# Patient Record
Sex: Female | Born: 1940 | Race: White | Hispanic: No | Smoking: Never smoker
Health system: Southern US, Community
[De-identification: ages and names within clinical notes are randomized; demographics above are authoritative.]

## PROBLEM LIST (undated history)

## (undated) DIAGNOSIS — E1122 Type 2 diabetes mellitus with diabetic chronic kidney disease: Secondary | ICD-10-CM

## (undated) DIAGNOSIS — Z9981 Dependence on supplemental oxygen: Secondary | ICD-10-CM

## (undated) DIAGNOSIS — Z8719 Personal history of other diseases of the digestive system: Secondary | ICD-10-CM

## (undated) DIAGNOSIS — F419 Anxiety disorder, unspecified: Secondary | ICD-10-CM

## (undated) DIAGNOSIS — T7840XA Allergy, unspecified, initial encounter: Secondary | ICD-10-CM

## (undated) DIAGNOSIS — I5033 Acute on chronic diastolic (congestive) heart failure: Secondary | ICD-10-CM

## (undated) DIAGNOSIS — I10 Essential (primary) hypertension: Secondary | ICD-10-CM

## (undated) DIAGNOSIS — K5792 Diverticulitis of intestine, part unspecified, without perforation or abscess without bleeding: Secondary | ICD-10-CM

## (undated) DIAGNOSIS — J9610 Chronic respiratory failure, unspecified whether with hypoxia or hypercapnia: Secondary | ICD-10-CM

## (undated) DIAGNOSIS — K589 Irritable bowel syndrome without diarrhea: Secondary | ICD-10-CM

## (undated) DIAGNOSIS — Z8489 Family history of other specified conditions: Secondary | ICD-10-CM

## (undated) DIAGNOSIS — I219 Acute myocardial infarction, unspecified: Secondary | ICD-10-CM

## (undated) DIAGNOSIS — M5030 Other cervical disc degeneration, unspecified cervical region: Secondary | ICD-10-CM

## (undated) DIAGNOSIS — E559 Vitamin D deficiency, unspecified: Secondary | ICD-10-CM

## (undated) DIAGNOSIS — T148XXA Other injury of unspecified body region, initial encounter: Secondary | ICD-10-CM

## (undated) DIAGNOSIS — C689 Malignant neoplasm of urinary organ, unspecified: Secondary | ICD-10-CM

## (undated) DIAGNOSIS — I4892 Unspecified atrial flutter: Secondary | ICD-10-CM

## (undated) DIAGNOSIS — M1990 Unspecified osteoarthritis, unspecified site: Secondary | ICD-10-CM

## (undated) DIAGNOSIS — G5793 Unspecified mononeuropathy of bilateral lower limbs: Secondary | ICD-10-CM

## (undated) DIAGNOSIS — I5042 Chronic combined systolic (congestive) and diastolic (congestive) heart failure: Secondary | ICD-10-CM

## (undated) DIAGNOSIS — I428 Other cardiomyopathies: Secondary | ICD-10-CM

## (undated) DIAGNOSIS — K5521 Angiodysplasia of colon with hemorrhage: Secondary | ICD-10-CM

## (undated) DIAGNOSIS — I471 Supraventricular tachycardia, unspecified: Secondary | ICD-10-CM

## (undated) DIAGNOSIS — D126 Benign neoplasm of colon, unspecified: Secondary | ICD-10-CM

## (undated) DIAGNOSIS — Z87442 Personal history of urinary calculi: Secondary | ICD-10-CM

## (undated) DIAGNOSIS — I480 Paroxysmal atrial fibrillation: Secondary | ICD-10-CM

## (undated) DIAGNOSIS — R0600 Dyspnea, unspecified: Secondary | ICD-10-CM

## (undated) DIAGNOSIS — J45909 Unspecified asthma, uncomplicated: Secondary | ICD-10-CM

## (undated) DIAGNOSIS — R55 Syncope and collapse: Secondary | ICD-10-CM

## (undated) DIAGNOSIS — N183 Chronic kidney disease, stage 3 unspecified: Secondary | ICD-10-CM

## (undated) DIAGNOSIS — N186 End stage renal disease: Secondary | ICD-10-CM

## (undated) DIAGNOSIS — G510 Bell's palsy: Secondary | ICD-10-CM

## (undated) DIAGNOSIS — K449 Diaphragmatic hernia without obstruction or gangrene: Secondary | ICD-10-CM

## (undated) DIAGNOSIS — C186 Malignant neoplasm of descending colon: Secondary | ICD-10-CM

## (undated) DIAGNOSIS — C651 Malignant neoplasm of right renal pelvis: Secondary | ICD-10-CM

## (undated) DIAGNOSIS — I129 Hypertensive chronic kidney disease with stage 1 through stage 4 chronic kidney disease, or unspecified chronic kidney disease: Secondary | ICD-10-CM

## (undated) DIAGNOSIS — R519 Headache, unspecified: Secondary | ICD-10-CM

## (undated) DIAGNOSIS — Q2112 Patent foramen ovale: Secondary | ICD-10-CM

## (undated) DIAGNOSIS — S99929A Unspecified injury of unspecified foot, initial encounter: Secondary | ICD-10-CM

## (undated) DIAGNOSIS — E669 Obesity, unspecified: Secondary | ICD-10-CM

## (undated) DIAGNOSIS — R195 Other fecal abnormalities: Secondary | ICD-10-CM

## (undated) DIAGNOSIS — R0902 Hypoxemia: Secondary | ICD-10-CM

## (undated) DIAGNOSIS — E039 Hypothyroidism, unspecified: Secondary | ICD-10-CM

## (undated) DIAGNOSIS — Z1509 Genetic susceptibility to other malignant neoplasm: Secondary | ICD-10-CM

## (undated) DIAGNOSIS — M109 Gout, unspecified: Secondary | ICD-10-CM

## (undated) DIAGNOSIS — I119 Hypertensive heart disease without heart failure: Secondary | ICD-10-CM

## (undated) DIAGNOSIS — A4101 Sepsis due to Methicillin susceptible Staphylococcus aureus: Secondary | ICD-10-CM

## (undated) DIAGNOSIS — I4819 Other persistent atrial fibrillation: Secondary | ICD-10-CM

## (undated) DIAGNOSIS — Z9889 Other specified postprocedural states: Secondary | ICD-10-CM

## (undated) DIAGNOSIS — E785 Hyperlipidemia, unspecified: Secondary | ICD-10-CM

## (undated) DIAGNOSIS — K3184 Gastroparesis: Secondary | ICD-10-CM

## (undated) DIAGNOSIS — N185 Chronic kidney disease, stage 5: Secondary | ICD-10-CM

## (undated) DIAGNOSIS — T4145XA Adverse effect of unspecified anesthetic, initial encounter: Secondary | ICD-10-CM

## (undated) DIAGNOSIS — F329 Major depressive disorder, single episode, unspecified: Secondary | ICD-10-CM

## (undated) DIAGNOSIS — Z9289 Personal history of other medical treatment: Secondary | ICD-10-CM

## (undated) DIAGNOSIS — H9190 Unspecified hearing loss, unspecified ear: Secondary | ICD-10-CM

## (undated) DIAGNOSIS — I5032 Chronic diastolic (congestive) heart failure: Secondary | ICD-10-CM

## (undated) DIAGNOSIS — D649 Anemia, unspecified: Secondary | ICD-10-CM

## (undated) DIAGNOSIS — F32A Depression, unspecified: Secondary | ICD-10-CM

## (undated) DIAGNOSIS — F0390 Unspecified dementia without behavioral disturbance: Secondary | ICD-10-CM

## (undated) DIAGNOSIS — R112 Nausea with vomiting, unspecified: Secondary | ICD-10-CM

## (undated) DIAGNOSIS — T8859XA Other complications of anesthesia, initial encounter: Secondary | ICD-10-CM

## (undated) DIAGNOSIS — G4730 Sleep apnea, unspecified: Secondary | ICD-10-CM

## (undated) DIAGNOSIS — E119 Type 2 diabetes mellitus without complications: Secondary | ICD-10-CM

## (undated) DIAGNOSIS — K219 Gastro-esophageal reflux disease without esophagitis: Secondary | ICD-10-CM

## (undated) DIAGNOSIS — C182 Malignant neoplasm of ascending colon: Secondary | ICD-10-CM

## (undated) DIAGNOSIS — Z973 Presence of spectacles and contact lenses: Secondary | ICD-10-CM

## (undated) DIAGNOSIS — I2510 Atherosclerotic heart disease of native coronary artery without angina pectoris: Secondary | ICD-10-CM

## (undated) DIAGNOSIS — I499 Cardiac arrhythmia, unspecified: Secondary | ICD-10-CM

## (undated) HISTORY — DX: Bell's palsy: G51.0

## (undated) HISTORY — PX: APPENDECTOMY: SHX54

## (undated) HISTORY — DX: Irritable bowel syndrome, unspecified: K58.9

## (undated) HISTORY — DX: Chronic kidney disease, stage 3 (moderate): N18.3

## (undated) HISTORY — DX: Unspecified osteoarthritis, unspecified site: M19.90

## (undated) HISTORY — DX: Hypertensive chronic kidney disease with stage 1 through stage 4 chronic kidney disease, or unspecified chronic kidney disease: I12.9

## (undated) HISTORY — DX: Major depressive disorder, single episode, unspecified: F32.9

## (undated) HISTORY — DX: Chronic kidney disease, stage 5: N18.5

## (undated) HISTORY — DX: Chronic kidney disease, stage 3 unspecified: N18.30

## (undated) HISTORY — DX: Unspecified injury of unspecified foot, initial encounter: S99.929A

## (undated) HISTORY — DX: Hypoxemia: R09.02

## (undated) HISTORY — DX: Gastro-esophageal reflux disease without esophagitis: K21.9

## (undated) HISTORY — DX: Unspecified asthma, uncomplicated: J45.909

## (undated) HISTORY — DX: Allergy, unspecified, initial encounter: T78.40XA

## (undated) HISTORY — DX: Personal history of other diseases of the digestive system: Z98.890

## (undated) HISTORY — DX: Diaphragmatic hernia without obstruction or gangrene: K44.9

## (undated) HISTORY — PX: POLYPECTOMY: SHX149

## (undated) HISTORY — PX: ESOPHAGEAL DILATION: SHX303

## (undated) HISTORY — PX: CARPAL TUNNEL RELEASE: SHX101

## (undated) HISTORY — DX: Depression, unspecified: F32.A

## (undated) HISTORY — DX: Chronic diastolic (congestive) heart failure: I50.32

## (undated) HISTORY — DX: Benign neoplasm of colon, unspecified: D12.6

## (undated) HISTORY — PX: TUBAL LIGATION: SHX77

## (undated) HISTORY — DX: Hypertensive chronic kidney disease with stage 1 through stage 4 chronic kidney disease, or unspecified chronic kidney disease: E11.22

## (undated) HISTORY — PX: TRIGGER FINGER RELEASE: SHX641

## (undated) HISTORY — PX: KIDNEY STONE SURGERY: SHX686

## (undated) HISTORY — DX: Personal history of other diseases of the digestive system: Z87.19

## (undated) HISTORY — PX: TOTAL ABDOMINAL HYSTERECTOMY: SHX209

## (undated) HISTORY — DX: Other cervical disc degeneration, unspecified cervical region: M50.30

## (undated) HISTORY — DX: Hyperlipidemia, unspecified: E78.5

## (undated) HISTORY — PX: UPPER GASTROINTESTINAL ENDOSCOPY: SHX188

## (undated) HISTORY — PX: FACIAL FRACTURE SURGERY: SHX1570

## (undated) HISTORY — DX: Gastroparesis: K31.84

## (undated) HISTORY — DX: Vitamin D deficiency, unspecified: E55.9

## (undated) HISTORY — DX: Anxiety disorder, unspecified: F41.9

## (undated) HISTORY — PX: COLONOSCOPY: SHX174

## (undated) HISTORY — PX: SIGMOIDOSCOPY: SUR1295

## (undated) HISTORY — PX: CERVICAL SPINE SURGERY: SHX589

## (undated) HISTORY — DX: Malignant neoplasm of right renal pelvis: C65.1

## (undated) HISTORY — PX: EYE SURGERY: SHX253

## (undated) HISTORY — PX: CHOLECYSTECTOMY: SHX55

## (undated) HISTORY — PX: COLONOSCOPY W/ POLYPECTOMY: SHX1380

## (undated) HISTORY — DX: Essential (primary) hypertension: I10

## (undated) HISTORY — DX: Angiodysplasia of colon with hemorrhage: K55.21

## (undated) SURGERY — Surgical Case
Anesthesia: *Unknown

---
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
## 1962-01-26 HISTORY — PX: CHOLECYSTECTOMY: SHX55

## 2003-06-30 ENCOUNTER — Encounter
Admission: RE | Admit: 2003-06-30 | Discharge: 2003-06-30 | Payer: Self-pay | Admitting: Physical Medicine and Rehabilitation

## 2004-02-20 ENCOUNTER — Ambulatory Visit: Payer: Self-pay | Admitting: Gastroenterology

## 2004-02-26 ENCOUNTER — Ambulatory Visit (HOSPITAL_COMMUNITY): Admission: RE | Admit: 2004-02-26 | Discharge: 2004-02-26 | Payer: Self-pay | Admitting: Internal Medicine

## 2004-02-26 ENCOUNTER — Ambulatory Visit: Payer: Self-pay | Admitting: Internal Medicine

## 2004-04-24 ENCOUNTER — Ambulatory Visit: Payer: Self-pay | Admitting: Cardiology

## 2004-06-04 ENCOUNTER — Ambulatory Visit: Payer: Self-pay | Admitting: Internal Medicine

## 2004-06-05 ENCOUNTER — Ambulatory Visit (HOSPITAL_COMMUNITY): Admission: RE | Admit: 2004-06-05 | Discharge: 2004-06-05 | Payer: Self-pay | Admitting: Internal Medicine

## 2004-07-11 ENCOUNTER — Emergency Department (HOSPITAL_COMMUNITY): Admission: EM | Admit: 2004-07-11 | Discharge: 2004-07-11 | Payer: Self-pay | Admitting: *Deleted

## 2004-07-25 ENCOUNTER — Encounter: Admission: RE | Admit: 2004-07-25 | Discharge: 2004-07-25 | Payer: Self-pay | Admitting: Specialist

## 2004-12-29 ENCOUNTER — Encounter (INDEPENDENT_AMBULATORY_CARE_PROVIDER_SITE_OTHER): Payer: Self-pay | Admitting: General Surgery

## 2004-12-29 ENCOUNTER — Ambulatory Visit (HOSPITAL_COMMUNITY): Admission: RE | Admit: 2004-12-29 | Discharge: 2004-12-29 | Payer: Self-pay | Admitting: General Surgery

## 2005-06-12 ENCOUNTER — Ambulatory Visit (HOSPITAL_COMMUNITY): Admission: RE | Admit: 2005-06-12 | Discharge: 2005-06-12 | Payer: Self-pay | Admitting: Neurosurgery

## 2009-02-07 DIAGNOSIS — K219 Gastro-esophageal reflux disease without esophagitis: Secondary | ICD-10-CM

## 2009-02-07 DIAGNOSIS — F411 Generalized anxiety disorder: Secondary | ICD-10-CM

## 2009-02-07 DIAGNOSIS — E039 Hypothyroidism, unspecified: Secondary | ICD-10-CM

## 2009-02-07 DIAGNOSIS — J45909 Unspecified asthma, uncomplicated: Secondary | ICD-10-CM

## 2009-02-07 DIAGNOSIS — Z8719 Personal history of other diseases of the digestive system: Secondary | ICD-10-CM

## 2009-02-07 DIAGNOSIS — R1310 Dysphagia, unspecified: Secondary | ICD-10-CM

## 2009-02-07 DIAGNOSIS — M1990 Unspecified osteoarthritis, unspecified site: Secondary | ICD-10-CM | POA: Insufficient documentation

## 2009-02-07 DIAGNOSIS — I959 Hypotension, unspecified: Secondary | ICD-10-CM | POA: Insufficient documentation

## 2009-02-07 HISTORY — DX: Dysphagia, unspecified: R13.10

## 2009-02-07 HISTORY — DX: Personal history of other diseases of the digestive system: Z87.19

## 2009-02-07 HISTORY — DX: Unspecified osteoarthritis, unspecified site: M19.90

## 2009-02-07 HISTORY — DX: Gastro-esophageal reflux disease without esophagitis: K21.9

## 2009-02-07 HISTORY — DX: Generalized anxiety disorder: F41.1

## 2009-02-08 ENCOUNTER — Ambulatory Visit: Payer: Self-pay | Admitting: Internal Medicine

## 2009-02-12 ENCOUNTER — Encounter: Payer: Self-pay | Admitting: Internal Medicine

## 2009-02-12 ENCOUNTER — Telehealth (INDEPENDENT_AMBULATORY_CARE_PROVIDER_SITE_OTHER): Payer: Self-pay | Admitting: *Deleted

## 2009-02-13 ENCOUNTER — Ambulatory Visit: Payer: Self-pay | Admitting: Internal Medicine

## 2009-02-13 ENCOUNTER — Ambulatory Visit (HOSPITAL_COMMUNITY): Admission: RE | Admit: 2009-02-13 | Discharge: 2009-02-13 | Payer: Self-pay | Admitting: Internal Medicine

## 2009-02-13 DIAGNOSIS — D126 Benign neoplasm of colon, unspecified: Secondary | ICD-10-CM

## 2009-02-13 HISTORY — DX: Benign neoplasm of colon, unspecified: D12.6

## 2009-02-17 ENCOUNTER — Encounter: Payer: Self-pay | Admitting: Internal Medicine

## 2009-02-19 ENCOUNTER — Telehealth (INDEPENDENT_AMBULATORY_CARE_PROVIDER_SITE_OTHER): Payer: Self-pay

## 2010-01-29 ENCOUNTER — Ambulatory Visit
Admission: RE | Admit: 2010-01-29 | Discharge: 2010-01-29 | Payer: Self-pay | Source: Home / Self Care | Attending: Cardiology | Admitting: Cardiology

## 2010-01-29 ENCOUNTER — Encounter: Payer: Self-pay | Admitting: Cardiology

## 2010-01-29 DIAGNOSIS — I6529 Occlusion and stenosis of unspecified carotid artery: Secondary | ICD-10-CM | POA: Insufficient documentation

## 2010-01-29 DIAGNOSIS — R002 Palpitations: Secondary | ICD-10-CM | POA: Insufficient documentation

## 2010-01-29 DIAGNOSIS — R000 Tachycardia, unspecified: Secondary | ICD-10-CM | POA: Insufficient documentation

## 2010-01-29 HISTORY — DX: Palpitations: R00.2

## 2010-01-29 HISTORY — DX: Occlusion and stenosis of unspecified carotid artery: I65.29

## 2010-02-11 ENCOUNTER — Telehealth (INDEPENDENT_AMBULATORY_CARE_PROVIDER_SITE_OTHER): Payer: Self-pay | Admitting: Radiology

## 2010-02-12 ENCOUNTER — Ambulatory Visit: Admission: RE | Admit: 2010-02-12 | Discharge: 2010-02-12 | Payer: Self-pay | Source: Home / Self Care

## 2010-02-12 ENCOUNTER — Ambulatory Visit
Admission: RE | Admit: 2010-02-12 | Discharge: 2010-02-12 | Payer: Self-pay | Source: Home / Self Care | Attending: Cardiology | Admitting: Cardiology

## 2010-02-12 ENCOUNTER — Encounter (INDEPENDENT_AMBULATORY_CARE_PROVIDER_SITE_OTHER): Payer: Self-pay | Admitting: *Deleted

## 2010-02-12 ENCOUNTER — Encounter: Payer: Self-pay | Admitting: Cardiology

## 2010-02-12 ENCOUNTER — Ambulatory Visit (HOSPITAL_COMMUNITY)
Admission: RE | Admit: 2010-02-12 | Discharge: 2010-02-12 | Payer: Self-pay | Source: Home / Self Care | Attending: Cardiology | Admitting: Cardiology

## 2010-02-12 DIAGNOSIS — I471 Supraventricular tachycardia: Secondary | ICD-10-CM

## 2010-02-16 ENCOUNTER — Encounter: Payer: Self-pay | Admitting: Specialist

## 2010-02-16 ENCOUNTER — Encounter (INDEPENDENT_AMBULATORY_CARE_PROVIDER_SITE_OTHER): Payer: Self-pay | Admitting: Internal Medicine

## 2010-02-27 NOTE — Letter (Signed)
Summary: TCS/EGD POSS ED ORDER  TCS/EGD POSS ED ORDER   Imported By: Sofie Rower 02/12/2009 13:12:15  _____________________________________________________________________  External Attachment:    Type:   Image     Comment:   External Document

## 2010-02-27 NOTE — Letter (Signed)
Summary: Patient Notice, Colon Biopsy Results  [facility]  [address]   [place], [place] [address]   Phone: [phone]  Fax: [phone]       February 17, 2009   [name] [address] [place], [place]  [address] [birth year]    Dear [name],  I am pleased to inform you that the biopsies taken during your recent colonoscopy did not show any evidence of cancer upon pathologic examination.  Additional information/recommendations:  No further action is needed at this time.  Please follow-up with your primary care physician for your other healthcare needs.  Please call [phone] to schedule a return visit to review your condition in 3 months.  You should have a repeat colonoscopy examination  in 3 years.  Please call us if you are having persistent problems or have questions about your condition that have not been fully answered at this time.  Sincerely,    R. Garfield Cornea MD  [facility] Ph: [phone]    Fax: [phone]   Appended Document: Patient Notice, Colon Biopsy Results Letter mailed to pt. ( Also, Dr. Gala Romney wants pt to have OV with extender within 3 months.)  Appended Document: Patient Notice, Colon Biopsy Results CALLED PT DID NOT WANT TO [facility] 3FU AT THIS TIME. SAID SHE WOULD CALL IF SHE NEEDED ONE.

## 2010-02-27 NOTE — Progress Notes (Signed)
Summary: throat problems  Phone Note Call from Patient Call back at Home Phone [phone]   Caller: Patient Summary of Call: pt called- had procedure with RMR x 1 week ago. pt stated her throat has been sore x 1 week but within the last 3 hours it has gotten worse. pt has been using throat spray.pt stated she looked in her throat with a flash light and it looks like a blister is formig on her uvula. She took some benedryl. Pt stated since it was so late in the day that she would go to the ed if things got worse or if she felt like she was having problems breathing.  Any recomendations? pt uses [facility] in [place]. please advise. Initial call taken by: Burnadette Peter LPN,  January 25, 624THL 4:19 PM     Appended Document: throat problems This sounds like a separate, coincidental process, particularly with blister seen; I recommend she see her pcp  Appended Document: throat problems Called, phone rang 6-8 times and no answer.  Appended Document: throat problems Pt informed, said she has called her PCP, Dr. Melina Copa and should be seeing her today.

## 2010-02-27 NOTE — Assessment & Plan Note (Signed)
Summary: np6/ dm, with tackycardia. pt has [employer] care. gd   Visit Type:  Initial Consult Primary Provider:  Octavio Graves, DO  CC:  SOB and Chest pain.  History of Present Illness: The patient presents for evaluation of chest discomfort. She has multiple cardiovascular risk factors. However, she's not had coronary disease in the past. She does report a catheterization perhaps 15 years ago and stress tests also many years ago. She reports that over the past several months at least she's had discomfort in her chest. This has been a burning sting in discomfort. It may be exacerbated by taking a deep breath. He otherwise seems to have been addressed. She doesn't describe neck or arm discomfort. She does have shortness of breath with activity which has been slowly progressive. She does not describe PND though she has chronically slept on 3 pillows. She does describe increasing fatigue with activity such as mopping. She has had a complete GI evaluation with no identifiable etiology. She also describes some palpitations that feel like "her heart running away". She's not had any presyncope or syncope however.  Problems Prior to Update: 1)  Fh of Colon Cancer  (ICD-153.9) 2)  Hypothyroidism  (ICD-244.9) 3)  Hypotension  (ICD-458.9) 4)  Anxiety  (ICD-300.00) 5)  Dm  (ICD-250.00) 6)  Diverticulitis, Hx of  (ICD-V12.79) 7)  Degenerative Joint Disease  (ICD-715.90) 8)  Osteoarthritis  (ICD-715.90) 9)  Asthma  (ICD-493.90) 10)  Dysphagia Unspecified  (ICD-787.20) 11)  Gastroesophageal Reflux Disease, Chronic  (ICD-530.81)  Current Medications (verified): 1)  Aspirin 81 Mg Tabs (Aspirin) .... Once Daily 2)  Singulair 10 Mg Tabs (Montelukast Sodium) .... Once Daily 3)  Cenestin 0.9 Mg Tabs (Estrogens Conj Synthetic A) .... Once Daily 4)  Synthroid 112 Mcg Tabs (Levothyroxine Sodium) .... Once Daily 5)  Klonopin 0.5 Mg Tabs (Clonazepam) .... As Needed 6)  Prevacid 30 Mg Cpdr  (Lansoprazole) .... Once Daily 7)  Tylenol Extra Strength 500 Mg Tabs (Acetaminophen) .... As Needed 8)  Metformin Hcl 500 Mg Tabs (Metformin Hcl) .... 2 Before Supper 9)  Novolog Penfill 100 Unit/ml Soln (Insulin Aspart) .... Take As Directed 10)  Tramadol Hcl 50 Mg Tabs (Tramadol Hcl) .... 2 Tablets Every 6 Hors As Needed 11)  Cenestin 0.9 Mg Tabs (Estrogens Conj Synthetic A) .... Once Daily 12)  Multi-Betic Diabetes  Tabs (Multiple Vitamins-Minerals) .... Once Daily 13)  Chlordiazepoxide Hcl 25 Mg Caps (Chlordiazepoxide Hcl) .... Once Daily 14)  Benicar 20 Mg Tabs (Olmesartan Medoxomil) .... Take One Tablet By Mouth Daily 15)  Vitamin D3 1000 Unit Tabs (Cholecalciferol) .... Once Daily 16)  Complete Allergy Relief 25 Mg Tabs (Diphenhydramine Hcl) .... Once Daily  Allergies (verified): 1)  ! Vancomycin 2)  ! Penicillin 3)  ! Prednisone 4)  ! * Cefzil  Past History:  Past Medical History: Diabetes x 1999 HTN off and on x years Hyperlipidemia Hypothyroid Carotid plaque  Family History: Father: deceased colon cancer Mother: deceased MI 77s Siblings: 1 sister deceased- ovarian and liver cancer, 1 sister living- hx of breast cancer, 1 brother deceased Family History of Colon Cancer: son deceased at age 84 with colon cancer  Social History: Reviewed history from 02/08/2009 and no changes required. Marital Status: divorced Children: 3 - 1 deceased Occupation:retired  Patient has never smoked.  Alcohol Use - no  Review of Systems       Positive for headaches, reflux, colitis, joint pains. Otherwise as stated in the history of present illness negative for  all other systems.  Vital Signs:  Patient profile:   70 year old female Height:      63 inches Weight:      201.50 pounds BMI:     35.82  Vitals Entered By: Hansel Feinstein CMA (January 29, 2010 12:37 PM)  Physical Exam  General:  Well developed, well nourished, in no acute distress. Head:  normocephalic and  atraumatic Eyes:  PERRLA/EOM intact; conjunctiva and lids normal. Mouth:  Poor dentition otherwise unremarkable Neck:  Neck supple, no JVD. No masses, thyromegaly or abnormal cervical nodes. Chest Wall:  no deformities or breast masses noted Lungs:  Clear bilaterally to auscultation and percussion. Abdomen:  Bowel sounds positive; abdomen soft and non-tender without masses, organomegaly, or hernias noted. No hepatosplenomegaly. Msk:  Back normal, normal gait. Muscle strength and tone normal. Extremities:  No clubbing or cyanosis. Neurologic:  Alert and oriented x 3. Skin:  Intact without lesions or rashes. Cervical Nodes:  no significant adenopathy Axillary Nodes:  no significant adenopathy Inguinal Nodes:  no significant adenopathy Psych:  Normal affect.   Detailed Cardiovascular Exam  Neck    Carotids: Carotids full and equal bilaterally without bruits.      Neck Veins: Normal, no JVD.    Heart    Inspection: no deformities or lifts noted.      Palpation: normal PMI with no thrills palpable.      Auscultation: regular rate and rhythm, S1, S2 without murmurs, rubs, gallops, or clicks.    Vascular    Abdominal Aorta: no palpable masses, pulsations, or audible bruits.      Femoral Pulses: normal femoral pulses bilaterally.      Pedal Pulses: normal pedal pulses bilaterally.      Radial Pulses: normal radial pulses bilaterally.      Peripheral Circulation: no clubbing, cyanosis, or edema noted with normal capillary refill.     EKG  Procedure date:  01/29/2010  Findings:      Sinus rhythm, rate 80, axis within normal limits, intervals within normal limits, no acute ST-T wave changes  Impression & Recommendations:  Problem # 1:  CHEST PAIN UNSPECIFIED (ICD-786.50) Patient chest pain is atypical. However, she has significant risk factors. She would not be able to walk on a treadmill. She said she had a horrible experience with an adenosine perfusion study previously.  Therefore, I will order a dobutamine echocardiogram. 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 She needs a repeat of this. Orders: Carotid Duplex (Carotid Duplex)  Problem # 3:  PALPITATIONS (ICD-785.1) The patient has had tachypalpitations and I will follow up on this with a 21 day event recorder.  Patient Instructions: 1)  Your physician recommends that you schedule a follow-up appointment in: [facility] after testing 2)  Your physician recommends that you continue on your current medications as directed. Please refer to the Current Medication list given to you today. 3)  Your physician has requested that you have a carotid duplex. This test is an ultrasound of the carotid arteries in your neck. It looks at blood flow through these arteries that supply the brain with blood. Allow one hour for this exam. There are no restrictions or special instructions. 4)  Your physician has requested that you have a dobutamine echocardiogram.  For further information please visit [email or web address].  Please follow instruction sheet as given. 5)  Your physician  has recommended that you wear an event monitor for 21 days.  Event monitors are medical devices that record the heart's electrical activity. Doctors most often use these monitors to diagnose arrhythmias. Arrhythmias are problems with the speed or rhythm of the heartbeat. The monitor is a small, portable device. You can wear one while you do your normal daily activities. This is usually used to diagnose what is causing palpitations/syncope (passing out).

## 2010-02-27 NOTE — Miscellaneous (Signed)
Summary: IV for Dobutamine Echo  20 G IV  AC started for Dobutamine Echo by Blima Singer.     IV
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
## 2010-02-27 NOTE — Progress Notes (Signed)
Summary: Diabetic Adjustment  ---- Converted from flag ---- ---- 02/11/2009 4:59 PM, R. Garfield Cornea MD wrote: yes; decrease night before metformin to one tablet instead of 2;o/w ok  ---- 02/11/2009 2:27 PM, Sofie Rower wrote: Do you want to make any changes to this patient't diabetic medications?  She is scheduled for 02/13/09 at 9:30am ------------------------------

## 2010-02-27 NOTE — Progress Notes (Signed)
Summary: stress echo pre-procedure  Phone Note Outgoing Call   Call placed by: Charlton Amor, CNMT,  February 11, 2010 3:41 PM Call placed to: Patient Reason for Call: Confirm/change Appt Summary of Call: Spoke with patient about stress echo.

## 2010-02-27 NOTE — Letter (Signed)
Summary: INS AUT FORM  INS AUT FORM   Imported By: Sofie Rower 02/12/2009 12:42:53  _____________________________________________________________________  External Attachment:    Type:   Image     Comment:   External Document

## 2010-03-05 ENCOUNTER — Encounter: Payer: Self-pay | Admitting: Cardiology

## 2010-03-05 ENCOUNTER — Ambulatory Visit (INDEPENDENT_AMBULATORY_CARE_PROVIDER_SITE_OTHER): Payer: Medicare PPO | Admitting: Cardiology

## 2010-03-05 DIAGNOSIS — E66813 Obesity, class 3: Secondary | ICD-10-CM | POA: Insufficient documentation

## 2010-03-05 DIAGNOSIS — R002 Palpitations: Secondary | ICD-10-CM

## 2010-03-05 DIAGNOSIS — E669 Obesity, unspecified: Secondary | ICD-10-CM

## 2010-03-05 HISTORY — DX: Morbid (severe) obesity due to excess calories: E66.01

## 2010-03-05 HISTORY — DX: Obesity, class 3: E66.813
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
## 2010-03-26 ENCOUNTER — Ambulatory Visit: Payer: Self-pay | Admitting: Cardiology

## 2010-04-10 ENCOUNTER — Other Ambulatory Visit (HOSPITAL_COMMUNITY): Payer: Self-pay | Admitting: Endocrinology

## 2010-04-10 DIAGNOSIS — Z1231 Encounter for screening mammogram for malignant neoplasm of breast: Secondary | ICD-10-CM

## 2010-04-18 ENCOUNTER — Ambulatory Visit (HOSPITAL_COMMUNITY)
Admission: RE | Admit: 2010-04-18 | Discharge: 2010-04-18 | Disposition: A | Discharge status: Home / Self Care | Payer: Medicare PPO | Source: Ambulatory Visit | Attending: Endocrinology | Admitting: Endocrinology

## 2010-04-18 DIAGNOSIS — Z1231 Encounter for screening mammogram for malignant neoplasm of breast: Secondary | ICD-10-CM | POA: Insufficient documentation

## 2010-06-13 NOTE — Op Note (Signed)
NAME:  [name], [name]              ACCOUNT NO.:  [id number]   MEDICAL RECORD NO.:  [id number]          PATIENT TYPE:  AMB   LOCATION:  DAY                           FACILITY:  [facility]   PHYSICIAN:  Jamesetta So, M.D.  DATE OF BIRTH:  1940-03-04   DATE OF PROCEDURE:  DATE OF DISCHARGE:                                 OPERATIVE REPORT   PREOPERATIVE DIAGNOSIS:  Neoplasm, right leg.   POSTOPERATIVE DIAGNOSIS:  Neoplasm, right leg.   PROCEDURE:  Excision of the neoplasm, right leg.   SURGEON:  Jamesetta So, M.D.   ANESTHESIA:  MAC.   INDICATIONS:  The patient is a 70 year old white female who is referred for  evaluation and treatment for a mass in her upper leg in the right thigh  region. It has been present for some time, but has increased in size over  the past years and is tender to touch.  The risks and benefits of the  procedure including bleeding and infection were fully explained to the  patient, who gave informed consent.   PROCEDURE NOTE:  The patient was placed in the supine position. The right  thigh and upper leg area were prepped and draped using the usual sterile  technique with Betadine. Surgical site confirmation was performed.   1% Xylocaine was used for local anesthesia. A longitudinal incision was made  over the mass which was lobulated and 3 cm in length. It was subcutaneous in  nature. 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 This was fully excised without difficulty.  Any bleeding was controlled using Bovie electrocautery. The subcutaneous  layer was reapproximated using a 3-0 Vicryl interrupted suture. The skin was  closed using a 4-0 Vicryl subcuticular suture. Dermabond was then applied.   All tape and needle counts were correct at the end of the procedure. The  patient was transferred to PACU in stable condition.   COMPLICATIONS:  None.   SPECIMEN:  Lipoma, right upper leg blood loss minimal.      Jamesetta So, M.D.  Electronically Signed     MAJ/MEDQ   D:  12/29/2004  T:  12/29/2004  Job:  [id number]   cc:   Octavio Graves  Fax: [phone]

## 2010-06-13 NOTE — Op Note (Signed)
[name], [name]              ACCOUNT NO.:  [id number]   MEDICAL RECORD NO.:  [id number]          PATIENT TYPE:  AMB   LOCATION:  SDS                          FACILITY:  [facility]   PHYSICIAN:  Leeroy Cha, M.D.   DATE OF BIRTH:  06/16/1940   DATE OF PROCEDURE:  06/12/2005  DATE OF DISCHARGE:  06/12/2005                                 OPERATIVE REPORT   PREOPERATIVE DIAGNOSIS:  Left carpal tunnel syndrome.   POSTOPERATIVE DIAGNOSIS:  Left carpal tunnel syndrome.   PROCEDURE:  Decompression of the left median nerve.   SURGEON:  Dr. Joya Salm   ANESTHESIA:  IV sedation plus local.   CLINICAL HISTORY:  The patient has been seen in my office because of hand  pain associated with weakness, numbness, and weakening of the thenar muscle.  Nerve conduction test was positive for carpal tunnel syndrome on the left  side.  Surgery was advised.  The risks were explained to her including  possibility of recurrence, infection, need for further surgery, no  improvement whatsoever.   PROCEDURE:  The patient was taken to the OR, and the left arm and hand was  prepped with DuraPrep.  Drapes were applied.  __________along the base of  the thumb was made with __________.  Incision followed the base of the thumb  through the skin, volar ligament, and through a thick, calcified carpal  ligament was accomplished.  The decompression was done distally and proximal  along the ulnar aspect of the nerve.  Because of the thickening of the  ligament, part of the ligament was also resected.  Having good decompression  proximal and distal, the area was irrigated.  Hemostasis was done with  bipolar.  The wound was closed with nylon.  A dressing was applied.  The  patient is going to go to PACU, discharge once he is stable, and she is  going to be followed in my office.           ______________________________  Leeroy Cha, M.D.     EB/MEDQ  D:  06/12/2005  T:  06/12/2005  Job:  [id number]

## 2010-06-13 NOTE — H&P (Signed)
NAME:  [name], [name] NO.:  [id number]   MEDICAL RECORD NO.:  [id number]          PATIENT TYPE:  EMS   LOCATION:  ED                           FACILITY:  [facility]   PHYSICIAN:  Jamesetta So, M.D.  DATE OF BIRTH:  1940-10-12   DATE OF ADMISSION:  12/09/2004  DATE OF DISCHARGE:  LH                                HISTORY & PHYSICAL   CHIEF COMPLAINT:  Mass, right thigh.   HISTORY AND PHYSICAL:  The patient is a 70 year old white female who is  referred for evaluation and treatment of a mass on the right thigh.  It has  been present for sometime, but has increased in size over the past year and  is tender to touch.  No drainage has been noted.   PAST MEDICAL HISTORY:  1.  __________ allergies.  2.  Hypertension.  3.  Non-insulin-dependent diabetes mellitus.  4.  Arthritis.   PAST SURGICAL HISTORY:  1.  Neck surgery.  2.  Finger surgery.  3.  Carpal tunnel.  4.  C-section.  5.  Hysterectomy.  6.  Bilateral eye surgery.  7.  Cholecystectomy.  8.  Tubal ligation.   CURRENT MEDICATIONS:  1.  __________.  2.  Prevacid.  3.  Lisinopril.  4.  Singulair.  5.  Synthroid.  6.  Glipizide.  7.  Baby aspirin.  8.  Propoxyphene p.r.n. pain.  9.  Byetta injection.   ALLERGIES:  VANCOMYCIN, PENICILLIN, PREDNISONE, CEFTIN.   REVIEW OF SYSTEMS:  The patient denies any recent chest pain, MI, CVA or  bleeding disorder.   PHYSICAL EXAMINATION:  GENERAL APPEARANCE:  The patient is a white female in  no acute distress.  LUNGS:  Clear to auscultation with good breath sounds bilaterally.  HEART:  Regular rate and rhythm without S3, S4 or murmurs.  EXTREMITIES:  A 3 cm lobulated, mobile, subcutaneous mass noted along the  upper lateral right thigh.   IMPRESSION:  Neoplasm, right leg, unspecified.   PLAN:  The patient is scheduled for excision of the neoplasm, right leg on  December 29, 2004.  The risks and benefits of the procedure including  bleeding, infection and  recurrence of the mass were fully explained to the  patient.  Gained informed consent.      Jamesetta So, M.D.  Electronically Signed     MAJ/MEDQ  D:  12/09/2004  T:  12/09/2004  Job:  [id number]   cc:   Short Stay at [facility]  Fax: [phone]

## 2010-09-03 ENCOUNTER — Encounter: Payer: Self-pay | Admitting: Cardiology

## 2011-01-26 ENCOUNTER — Other Ambulatory Visit: Payer: Self-pay | Admitting: Optometry

## 2011-01-26 DIAGNOSIS — H532 Diplopia: Secondary | ICD-10-CM

## 2011-01-27 DIAGNOSIS — G510 Bell's palsy: Secondary | ICD-10-CM

## 2011-01-27 HISTORY — DX: Bell's palsy: G51.0

## 2011-01-29 ENCOUNTER — Ambulatory Visit
Admission: RE | Admit: 2011-01-29 | Discharge: 2011-01-29 | Disposition: A | Discharge status: Home / Self Care | Payer: Medicare PPO | Source: Ambulatory Visit | Attending: Optometry | Admitting: Optometry

## 2011-01-29 ENCOUNTER — Other Ambulatory Visit: Payer: Medicare PPO

## 2011-01-29 DIAGNOSIS — H532 Diplopia: Secondary | ICD-10-CM

## 2011-05-29 ENCOUNTER — Other Ambulatory Visit (HOSPITAL_COMMUNITY): Payer: Self-pay | Admitting: *Deleted

## 2011-05-29 DIAGNOSIS — Z1231 Encounter for screening mammogram for malignant neoplasm of breast: Secondary | ICD-10-CM

## 2011-06-24 ENCOUNTER — Ambulatory Visit (HOSPITAL_COMMUNITY)
Admission: RE | Admit: 2011-06-24 | Discharge: 2011-06-24 | Disposition: A | Discharge status: Home / Self Care | Payer: Medicare PPO | Source: Ambulatory Visit | Attending: *Deleted | Admitting: *Deleted

## 2011-06-24 DIAGNOSIS — Z1231 Encounter for screening mammogram for malignant neoplasm of breast: Secondary | ICD-10-CM | POA: Insufficient documentation

## 2012-02-15 ENCOUNTER — Observation Stay (HOSPITAL_COMMUNITY)
Admission: EM | Admit: 2012-02-15 | Discharge: 2012-02-17 | Disposition: A | Discharge status: Home / Self Care | Payer: Medicare PPO | Attending: Internal Medicine | Admitting: Internal Medicine

## 2012-02-15 ENCOUNTER — Encounter (HOSPITAL_COMMUNITY): Payer: Self-pay | Admitting: Emergency Medicine

## 2012-02-15 ENCOUNTER — Emergency Department (HOSPITAL_COMMUNITY): Payer: Medicare PPO

## 2012-02-15 DIAGNOSIS — I4891 Unspecified atrial fibrillation: Secondary | ICD-10-CM

## 2012-02-15 DIAGNOSIS — E119 Type 2 diabetes mellitus without complications: Secondary | ICD-10-CM

## 2012-02-15 DIAGNOSIS — E785 Hyperlipidemia, unspecified: Secondary | ICD-10-CM | POA: Insufficient documentation

## 2012-02-15 DIAGNOSIS — E039 Hypothyroidism, unspecified: Secondary | ICD-10-CM | POA: Insufficient documentation

## 2012-02-15 DIAGNOSIS — E669 Obesity, unspecified: Secondary | ICD-10-CM

## 2012-02-15 DIAGNOSIS — I10 Essential (primary) hypertension: Secondary | ICD-10-CM | POA: Insufficient documentation

## 2012-02-15 DIAGNOSIS — I480 Paroxysmal atrial fibrillation: Secondary | ICD-10-CM | POA: Diagnosis not present

## 2012-02-15 DIAGNOSIS — Z79899 Other long term (current) drug therapy: Secondary | ICD-10-CM | POA: Insufficient documentation

## 2012-02-15 DIAGNOSIS — R079 Chest pain, unspecified: Principal | ICD-10-CM

## 2012-02-15 HISTORY — DX: Hypothyroidism, unspecified: E03.9

## 2012-02-15 HISTORY — DX: Obesity, unspecified: E66.9

## 2012-02-15 LAB — BASIC METABOLIC PANEL
BUN: 10 mg/dL (ref 6–23)
CO2: 26 mEq/L (ref 19–32)
Calcium: 9.7 mg/dL (ref 8.4–10.5)
Chloride: 94 mEq/L — ABNORMAL LOW (ref 96–112)
Creatinine, Ser: 0.74 mg/dL (ref 0.50–1.10)
Glucose, Bld: 122 mg/dL — ABNORMAL HIGH (ref 70–99)

## 2012-02-15 LAB — CBC
HCT: 40.5 % (ref 36.0–46.0)
MCH: 29.6 pg (ref 26.0–34.0)
MCHC: 32.8 g/dL (ref 30.0–36.0)
MCV: 90.2 fL (ref 78.0–100.0)
RDW: 13.7 % (ref 11.5–15.5)

## 2012-02-15 MED ORDER — ACETAMINOPHEN 500 MG PO TABS: 500.0000 mg | ORAL_TABLET | ORAL | Status: DC | PRN

## 2012-02-15 MED ORDER — ONDANSETRON HCL 4 MG/2ML IJ SOLN: 4.0000 mg | Freq: Four times a day (QID) | INTRAMUSCULAR | Status: DC | PRN

## 2012-02-15 MED ORDER — ADULT MULTIVITAMIN W/MINERALS CH
1.0000 | ORAL_TABLET | Freq: Every day | ORAL | Status: DC
  Administered 2012-02-16: 1 via ORAL
  Filled 2012-02-15 (×2): qty 1

## 2012-02-15 MED ORDER — TRAMADOL HCL 50 MG PO TABS: 100.0000 mg | ORAL_TABLET | Freq: Four times a day (QID) | ORAL | Status: DC | PRN

## 2012-02-15 MED ORDER — EXENATIDE 5 MCG/0.02ML ~~LOC~~ SOPN
5.0000 ug | PEN_INJECTOR | Freq: Two times a day (BID) | SUBCUTANEOUS | Status: DC
  Administered 2012-02-16 – 2012-02-17 (×3): 5 ug via SUBCUTANEOUS

## 2012-02-15 MED ORDER — METFORMIN HCL ER 500 MG PO TB24
500.0000 mg | ORAL_TABLET | Freq: Two times a day (BID) | ORAL | Status: DC
  Administered 2012-02-16: 500 mg via ORAL
  Filled 2012-02-15 (×6): qty 1

## 2012-02-15 MED ORDER — ASPIRIN 81 MG PO CHEW
324.0000 mg | CHEWABLE_TABLET | ORAL | Status: AC
  Administered 2012-02-16: 324 mg via ORAL
  Filled 2012-02-15: qty 4

## 2012-02-15 MED ORDER — ESTROGENS CONJUGATED 0.9 MG PO TABS
0.9000 mg | ORAL_TABLET | Freq: Every day | ORAL | Status: DC
  Administered 2012-02-16 – 2012-02-17 (×2): 0.9 mg via ORAL
  Filled 2012-02-15 (×2): qty 1

## 2012-02-15 MED ORDER — MULTI-BETIC DIABETES PO TABS: 1.0000 | ORAL_TABLET | Freq: Every day | ORAL | Status: DC

## 2012-02-15 MED ORDER — PANTOPRAZOLE SODIUM 40 MG PO TBEC
40.0000 mg | DELAYED_RELEASE_TABLET | Freq: Every day | ORAL | Status: DC
  Administered 2012-02-16 – 2012-02-17 (×2): 40 mg via ORAL
  Filled 2012-02-15 (×2): qty 1

## 2012-02-15 MED ORDER — ASPIRIN 81 MG PO CHEW
81.0000 mg | CHEWABLE_TABLET | Freq: Every day | ORAL | Status: DC
  Filled 2012-02-15: qty 1

## 2012-02-15 MED ORDER — ACETAMINOPHEN 325 MG PO TABS
650.0000 mg | ORAL_TABLET | ORAL | Status: DC | PRN
  Administered 2012-02-16 (×2): 650 mg via ORAL
  Filled 2012-02-15 (×2): qty 2

## 2012-02-15 MED ORDER — RISAQUAD PO CAPS
1.0000 | ORAL_CAPSULE | Freq: Every day | ORAL | Status: DC
  Administered 2012-02-16 – 2012-02-17 (×2): 1 via ORAL
  Filled 2012-02-15 (×3): qty 1

## 2012-02-15 MED ORDER — GLIPIZIDE ER 10 MG PO TB24
10.0000 mg | ORAL_TABLET | Freq: Every day | ORAL | Status: DC
  Administered 2012-02-16 – 2012-02-17 (×2): 10 mg via ORAL
  Filled 2012-02-15 (×3): qty 1

## 2012-02-15 MED ORDER — NITROGLYCERIN 0.4 MG SL SUBL: 0.4000 mg | SUBLINGUAL_TABLET | SUBLINGUAL | Status: DC | PRN

## 2012-02-15 MED ORDER — CLONAZEPAM 0.5 MG PO TABS
0.5000 mg | ORAL_TABLET | Freq: Two times a day (BID) | ORAL | Status: DC | PRN
Start: 2012-02-15 — End: 2012-02-17
  Administered 2012-02-16: 0.5 mg via ORAL
  Filled 2012-02-15: qty 1

## 2012-02-15 MED ORDER — LEVOTHYROXINE SODIUM 137 MCG PO TABS
137.0000 ug | ORAL_TABLET | Freq: Every day | ORAL | Status: DC
  Administered 2012-02-16 – 2012-02-17 (×2): 137 ug via ORAL
  Filled 2012-02-15 (×3): qty 1

## 2012-02-15 MED ORDER — ASPIRIN EC 81 MG PO TBEC
81.0000 mg | DELAYED_RELEASE_TABLET | Freq: Every day | ORAL | Status: DC
  Filled 2012-02-15: qty 1

## 2012-02-15 MED ORDER — HYDROCHLOROTHIAZIDE 25 MG PO TABS
25.0000 mg | ORAL_TABLET | Freq: Every day | ORAL | Status: DC
  Administered 2012-02-16 – 2012-02-17 (×2): 25 mg via ORAL
  Filled 2012-02-15 (×3): qty 1

## 2012-02-15 MED ORDER — VITAMIN D3 25 MCG (1000 UNIT) PO TABS
5000.0000 [IU] | ORAL_TABLET | Freq: Every day | ORAL | Status: DC
  Administered 2012-02-16 – 2012-02-17 (×2): 5000 [IU] via ORAL
  Filled 2012-02-15 (×2): qty 5

## 2012-02-15 MED ORDER — SIMVASTATIN 40 MG PO TABS
40.0000 mg | ORAL_TABLET | Freq: Every evening | ORAL | Status: DC
  Administered 2012-02-16 (×2): 40 mg via ORAL
  Filled 2012-02-15 (×3): qty 1

## 2012-02-15 MED ORDER — HEPARIN SODIUM (PORCINE) 5000 UNIT/ML IJ SOLN
5000.0000 [IU] | Freq: Three times a day (TID) | INTRAMUSCULAR | Status: DC
  Administered 2012-02-16 (×2): 5000 [IU] via SUBCUTANEOUS
  Filled 2012-02-15 (×5): qty 1

## 2012-02-15 MED ORDER — ASPIRIN 300 MG RE SUPP
300.0000 mg | RECTAL | Status: AC
  Filled 2012-02-15: qty 1

## 2012-02-15 MED ORDER — ESTROGENS CONJ SYNTHETIC A 0.9 MG PO TABS: 0.9000 mg | ORAL_TABLET | Freq: Every day | ORAL | Status: DC

## 2012-02-15 MED ORDER — ALIGN 4 MG PO CAPS: 4.0000 mg | ORAL_CAPSULE | Freq: Every day | ORAL | Status: DC
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
## 2012-02-15 NOTE — ED Notes (Signed)
Pt alert and oriented x4. Respirations even and unlabored, bilateral symmetrical rise and fall of chest. Skin warm and dry. In no acute distress. Denies needs.   

## 2012-02-15 NOTE — ED Notes (Signed)
md at bedside
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
## 2012-02-15 NOTE — ED Notes (Signed)
Pt sent from urgent care with chest pain, states pain started 3-4 days ago, states at same time started new medication for BP which was cardizem, states feels like something is heavy is laying on chest, c/o SOB but h/o asthma, , states pain moves into neck

## 2012-02-16 ENCOUNTER — Encounter (HOSPITAL_COMMUNITY): Payer: Self-pay | Admitting: *Deleted

## 2012-02-16 DIAGNOSIS — I4891 Unspecified atrial fibrillation: Secondary | ICD-10-CM

## 2012-02-16 DIAGNOSIS — I480 Paroxysmal atrial fibrillation: Secondary | ICD-10-CM | POA: Diagnosis not present

## 2012-02-16 DIAGNOSIS — E119 Type 2 diabetes mellitus without complications: Secondary | ICD-10-CM

## 2012-02-16 LAB — BASIC METABOLIC PANEL
BUN: 12 mg/dL (ref 6–23)
CO2: 30 mEq/L (ref 19–32)
Chloride: 96 mEq/L (ref 96–112)
Creatinine, Ser: 0.95 mg/dL (ref 0.50–1.10)
GFR calc Af Amer: 69 mL/min — ABNORMAL LOW (ref >90)
Glucose, Bld: 133 mg/dL — ABNORMAL HIGH (ref 70–99)
Potassium: 3.2 mEq/L — ABNORMAL LOW (ref 3.5–5.1)

## 2012-02-16 LAB — GLUCOSE, CAPILLARY
Glucose-Capillary: 156 mg/dL — ABNORMAL HIGH (ref 70–99)
Glucose-Capillary: 159 mg/dL — ABNORMAL HIGH (ref 70–99)

## 2012-02-16 MED ORDER — POTASSIUM CHLORIDE CRYS ER 20 MEQ PO TBCR
20.0000 meq | EXTENDED_RELEASE_TABLET | Freq: Once | ORAL | Status: AC
  Administered 2012-02-16: 20 meq via ORAL
  Filled 2012-02-16: qty 1

## 2012-02-16 MED ORDER — RIVAROXABAN 20 MG PO TABS
20.0000 mg | ORAL_TABLET | Freq: Every day | ORAL | Status: DC
  Administered 2012-02-16 – 2012-02-17 (×2): 20 mg via ORAL
  Filled 2012-02-16 (×2): qty 1

## 2012-02-16 MED ORDER — METOPROLOL TARTRATE 25 MG PO TABS
25.0000 mg | ORAL_TABLET | Freq: Two times a day (BID) | ORAL | Status: DC
  Administered 2012-02-16 – 2012-02-17 (×3): 25 mg via ORAL
  Filled 2012-02-16 (×4): qty 1

## 2012-02-16 NOTE — Progress Notes (Signed)
Patient has own Byetta pen at bedside. Per pharmacist, medication does not need to be dispensed by pharmacy.  It should be able to scan with barcode on pen per pharmacist.  Medication is at bedside and explained to patient that RN will be administering each dose.

## 2012-02-16 NOTE — Progress Notes (Signed)
At 0530, it came to my attention that patient had converted from NSR to A.fib at 0156. At 0245, patient converted back to NSR. During this time, patient was sleeping. MD on call was paged with these findings and was told that patient did not have a history of a.fib in current notes. MD stated "to hold off with these calls until 0630." Will report off to oncoming shift and continue to monitor.

## 2012-02-16 NOTE — Progress Notes (Signed)
  Echocardiogram 2D Echocardiogram has been performed.  Basilia Jumbo 02/16/2012, 11:46 AM

## 2012-02-16 NOTE — Progress Notes (Signed)
   TELEMETRY: Reviewed telemetry pt in NSR, intermittent atrial fibrillation with RVR rate 120.: Filed Vitals:   02/15/12 2000 02/15/12 2100 02/15/12 2144 02/16/12 0516  BP: 133/62 117/60 154/73 134/65  Pulse: 84 74 85 80  Temp:   98.3 F (36.8 C) 97.3 F (36.3 C)  TempSrc:   Oral Oral  Resp: 19 20 19 18   Height:   5\' 2"  (1.575 m)   Weight:   204 lb 4.8 oz (92.67 kg)   SpO2: 93% 95% 95% 93%    Intake/Output Summary (Last 24 hours) at 02/16/12 0802 Last data filed at 02/16/12 0516  Gross per 24 hour  Intake      0 ml  Output    300 ml  Net   -300 ml    SUBJECTIVE No recurrent chest pain. Feels well today. Patient reports intermittent symptoms of palpitations at home associated with dizzyness.   LABS: Basic Metabolic Panel:  Basename 02/16/12 0350 02/15/12 1330  NA 140 135  K 3.2* 5.1  CL 96 94*  CO2 30 26  GLUCOSE 133* 122*  BUN 12 10  CREATININE 0.95 0.74  CALCIUM 9.9 9.7  MG -- --  PHOS -- --   CBC:  Basename 02/15/12 1330  WBC 10.2  NEUTROABS --  HGB 13.3  HCT 40.5  MCV 90.2  PLT 317   Cardiac Enzymes:  Basename 02/16/12 0350 02/15/12 2221  CKTOTAL -- --  CKMB -- --  CKMBINDEX -- --  TROPONINI <0.30 <0.30    Radiology/Studies:  Dg Chest Portable 1 View  02/15/2012  *RADIOLOGY REPORT*  Clinical Data: Chest pain and weakness.  PORTABLE CHEST - 1 VIEW  Comparison: None.  Findings: Trachea is midline.  Heart size normal.  Lungs are clear but low in volume.  No pleural fluid.  IMPRESSION: Low lung volumes.  No acute findings.   Original Report Authenticated By: Lorin Picket, M.D.     PHYSICAL EXAM General: Well developed, obese, in no acute distress. Head: Normocephalic, atraumatic, sclera non-icteric, no xanthomas, nares are without discharge. Neck: Negative for carotid bruits. JVD not elevated. Lungs: Clear bilaterally to auscultation without wheezes, rales, or rhonchi. Breathing is unlabored. Heart: RRR S1 S2 without murmurs, rubs, or gallops.    Abdomen: Soft, non-tender, non-distended with normoactive bowel sounds. Obese. No hepatomegaly. No rebound/guarding. No obvious abdominal masses. Msk:  Strength and tone appears normal for age. Extremities: No clubbing, cyanosis or edema.  Distal pedal pulses are 2+ and equal bilaterally. Neuro: Alert and oriented X 3. Moves all extremities spontaneously. Psych:  Responds to questions appropriately with a normal affect.  ASSESSMENT AND PLAN: 1. Chest pain. Ruling out for MI. Probably related to intolerance for diltiazem. Normal stress Echo 2012. Normal cath remotely. 2. Paroxysmal atrial fibrillation. Newly diagnosed. RVR. Will start metoprolol for rate control. [place] score is elevated given age, sex, DM, and HTN. Recommend long term anticoagulation. Will start Xarelto 20 mg daily. Check Echo and TSH today. Check d-dimer. 3. DM type 2 4. HTN 5. Obesity. 6. Hyperlipidemia on Zocor  Principal Problem:  *Chest pain at rest Active Problems:  DM  CHEST PAIN UNSPECIFIED  Obesity, unspecified  Atrial fibrillation    Signed, Mikell [name] [name] MD,FACC 02/16/2012 8:02 AM

## 2012-02-17 ENCOUNTER — Encounter (HOSPITAL_COMMUNITY): Payer: Self-pay | Admitting: Physician Assistant

## 2012-02-17 DIAGNOSIS — E669 Obesity, unspecified: Secondary | ICD-10-CM

## 2012-02-17 LAB — BASIC METABOLIC PANEL
Calcium: 10 mg/dL (ref 8.4–10.5)
Creatinine, Ser: 0.96 mg/dL (ref 0.50–1.10)
GFR calc Af Amer: 68 mL/min — ABNORMAL LOW (ref >90)
GFR calc non Af Amer: 59 mL/min — ABNORMAL LOW (ref >90)

## 2012-02-17 MED ORDER — RIVAROXABAN 20 MG PO TABS: 20.0000 mg | ORAL_TABLET | Freq: Every day | ORAL | Status: DC

## 2012-02-17 MED ORDER — METOPROLOL TARTRATE 25 MG PO TABS: 25.0000 mg | ORAL_TABLET | Freq: Two times a day (BID) | ORAL | Status: DC

## 2012-02-17 NOTE — Discharge Summary (Signed)
Patient seen and examined and history reviewed. Agree with above findings and plan. See my earlier rounding note.  Luana Shu 02/17/2012 12:33 PM

## 2012-02-17 NOTE — Progress Notes (Signed)
During shift report, patient states she is going to be discharged today.  Explained discharge is pending Md instructions.

## 2012-02-17 NOTE — Progress Notes (Signed)
TELEMETRY: Reviewed telemetry pt in NSR, no further Afib seen. Filed Vitals:   02/16/12 0516 02/16/12 1411 02/16/12 2101 02/17/12 0449  BP: 134/65 122/72 123/69 124/70  Pulse: 80 70 66 67  Temp: 97.3 F (36.3 C) 97 F (36.1 C) 98.1 F (36.7 C) 97.7 F (36.5 C)  TempSrc: Oral Oral Oral Oral  Resp: 18 20 19 19   Height:      Weight:      SpO2: 93% 93% 97% 95%    Intake/Output Summary (Last 24 hours) at 02/17/12 0654 Last data filed at 02/17/12 0450  Gross per 24 hour  Intake   1200 ml  Output    850 ml  Net    350 ml    SUBJECTIVE No recurrent chest pain. Feels well today. One episode of mild dizzyness yesterday.  LABS: Basic Metabolic Panel:  Basename 02/17/12 0457 02/16/12 0350  NA 135 140  K 3.5 3.2*  CL 92* 96  CO2 27 30  GLUCOSE 210* 133*  BUN 13 12  CREATININE 0.96 0.95  CALCIUM 10.0 9.9  MG -- --  PHOS -- --   CBC:  Basename 02/15/12 1330  WBC 10.2  NEUTROABS --  HGB 13.3  HCT 40.5  MCV 90.2  PLT 317   Cardiac Enzymes:  Basename 02/16/12 0916 02/16/12 0350 02/15/12 2221  CKTOTAL -- -- --  CKMB -- -- --  CKMBINDEX -- -- --  TROPONINI <0.30 <0.30 <0.30    Radiology/Studies:  Dg Chest Portable 1 View  02/15/2012  *RADIOLOGY REPORT*  Clinical Data: Chest pain and weakness.  PORTABLE CHEST - 1 VIEW  Comparison: None.  Findings: Trachea is midline.  Heart size normal.  Lungs are clear but low in volume.  No pleural fluid.  IMPRESSION: Low lung volumes.  No acute findings.   Original Report Authenticated By: Lorin Picket, M.D.    Transthoracic Echocardiography  Patient: [name], [name] MR #: [id number] Study Date: 02/16/2012 Gender: F Age: 72 Height: 157.5cm Weight: 92.5kg BSA: 1.6m^2 Pt. Status: Room:  ORDERING [name], Duvall Comes ADMITTING Ross, Paula ATTENDING Ross, Paula PERFORMING [place] SONOGRAPHER [place], RDCS cc:  ------------------------------------------------------------ LV EF: 50% LV EF: 45% -  50%  ------------------------------------------------------------ Indications: Atrial fibrillation - 427.31.  ------------------------------------------------------------ History: Risk factors: Hypothyroidism. Anxiety. Hypotension. Asthma. GERD. Osteoarthritis. Dysphagia. Diverticulosis. Carotid stenosis. Tachycardia. Palpitation. Diabetes mellitus. Obese.  ------------------------------------------------------------ Study Conclusions  - Left ventricle: The cavity size was normal. Wall thickness was increased in a pattern of mild LVH. Systolic function was mildly reduced. The estimated ejection fraction was 50%, in the range of 45% to 50%. Diffuse hypokinesis. Doppler parameters are consistent with abnormal left ventricular relaxation (grade 1 diastolic dysfunction). - Mitral valve: Calcified annulus. - Left atrium: The atrium was mildly dilated. Transthoracic echocardiography. M-mode, complete 2D, spectral Doppler, and color Doppler. Height: Height: 157.5cm. Height: 62in. Weight: Weight: 92.5kg. Weight: 203.6lb. Body mass index: BMI: 37.3kg/m^2. Body surface area: BSA: 1.101m^2. Blood pressure: 134/65. Patient status: Inpatient. Location: Bedside.  ------------------------------------------------------------  ------------------------------------------------------------ Left ventricle: The cavity size was normal. Wall thickness was increased in a pattern of mild LVH. Systolic function was mildly reduced. The estimated ejection fraction was 50%, in the range of 45% to 50%. Diffuse hypokinesis. Doppler parameters are consistent with abnormal left ventricular relaxation (grade 1 diastolic dysfunction).  ------------------------------------------------------------ Aortic valve: Trileaflet; mildly calcified leaflets. Mobility was not restricted. Doppler: Transvalvular velocity was within the normal range. There was no stenosis. No  regurgitation.  ------------------------------------------------------------ Aorta: Aortic root: The aortic  root was normal in size.  ------------------------------------------------------------ Mitral valve: Calcified annulus. Mobility was not restricted. Doppler: Transvalvular velocity was within the normal range. There was no evidence for stenosis. Trivial regurgitation. Peak gradient: 58mm Hg (D).  ------------------------------------------------------------ Left atrium: The atrium was mildly dilated.  ------------------------------------------------------------ Right ventricle: The cavity size was normal. Systolic function was normal.  ------------------------------------------------------------ Pulmonic valve: Doppler: Transvalvular velocity was within the normal range. There was no evidence for stenosis.  ------------------------------------------------------------ Tricuspid valve: Structurally normal valve. Doppler: Transvalvular velocity was within the normal range. No regurgitation.  ------------------------------------------------------------ Right atrium: The atrium was normal in size.  ------------------------------------------------------------ Pericardium: There was no pericardial effusion.  ------------------------------------------------------------  2D measurements Normal Doppler Normal Left ventricle measurements LVID ED, 47 mm 43-52 Left ventricle chord, Ea, lat 7.02 cm/ ------- PLAX ann, tiss s LVID ES, 31 mm 23-38 DP chord, E/Ea, lat 12.52 ------- PLAX ann, tiss FS, chord, 34 % >29 DP PLAX Ea, med 5.65 cm/ ------- LVPW, ED 14 mm ------ ann, tiss s IVS/LVPW 0.93 <1.3 DP ratio, ED E/Ea, med 15.56 ------- Ventricular septum ann, tiss IVS, ED 13 mm ------ DP Aorta Mitral valve Root diam, 33 mm ------ Peak E vel 87.9 cm/ ------- ED s Left atrium Deceleratio 169 ms 150-230 AP dim 38 mm ------ n time AP dim 1.97 cm/m^2 <2.2 Peak 3 mm ------- index  gradient, D Hg Vol, S 50 ml ------ Systemic veins Vol index, 25.9 ml/m^2 ------ Estimated 10 mm ------- S CVP Hg Right ventricle Sa vel, lat 15.9 cm/ ------- ann, tiss s DP  ------------------------------------------------------------ Prepared and Electronically Authenticated by  Kirk Ruths 2014-01-21T16:20:10.613  PHYSICAL EXAM General: Well developed, obese, in no acute distress. Head: Normocephalic, atraumatic, sclera non-icteric, no xanthomas, nares are without discharge. Neck: Negative for carotid bruits. JVD not elevated. Lungs: Clear bilaterally to auscultation without wheezes, rales, or rhonchi. Breathing is unlabored. Heart: RRR S1 S2 without murmurs, rubs, or gallops.  Abdomen: Soft, non-tender, non-distended with normoactive bowel sounds. Obese. No hepatomegaly. No rebound/guarding. No obvious abdominal masses. Msk:  Strength and tone appears normal for age. Extremities: No clubbing, cyanosis or edema.  Distal pedal pulses are 2+ and equal bilaterally. Neuro: Alert and oriented X 3. Moves all extremities spontaneously. Psych:  Responds to questions appropriately with a normal affect.  ASSESSMENT AND PLAN: 1. Chest pain. Ruled out for MI. Probably related to intolerance for diltiazem. Normal stress Echo 2012. Normal cath remotely. 2. Paroxysmal atrial fibrillation. Newly diagnosed. RVR. On metoprolol for rate control. [place] score is elevated given age, sex, DM, and HTN. Recommend long term anticoagulation. Will start Xarelto 20 mg daily. Echo shows mild LVH with EF 50%. Mild LAE. TSH is normal. 3. DM type 2 4. HTN 5. Obesity. 6. Hyperlipidemia on Zocor  Plan: will discharge today on current therapy. Follow up with [facility] in [place]. Principal Problem:  *Chest pain at rest Active Problems:  DM  CHEST PAIN UNSPECIFIED  Obesity, unspecified  Atrial fibrillation    Signed, Jonaya Freshour [name] MD,FACC 02/17/2012 6:54 AM

## 2012-02-17 NOTE — Discharge Summary (Signed)
CARDIOLOGY DISCHARGE SUMMARY   Patient ID: REA ARIZOLA MRN: [id number] DOB/AGE: 1940-12-28 72 y.o.  Admit date: 02/15/2012 Discharge date: 02/17/2012  Primary Discharge Diagnosis:    *Chest pain at rest  Secondary Discharge Diagnosis:   DM  Obesity, unspecified  Paroxysmal Atrial fibrillation RVR Hypothyroidism  Procedures:  2-D echocardiogram  Hospital Course: Ms. Mcgrue is a 72 year old female with a history of palpitations and chest pain, stress echocardiogram and remote cath within normal limits. She had several episodes of chest pain after being started on diltiazem for blood pressure control. Her symptoms worsened and she came to the emergency room where she was admitted for further evaluation and treatment.  Her cardiac enzymes were negative for MI. An echocardiogram was checked and showed an EF of approximately 50%. A TSH was within normal limits. She had been started on diltiazem a week before admission. There was concern that diltiazem is causing her symptoms so it was discontinued. Her chest pain resolved. Dr. [name] felt her chest pain was probably related to intolerance for diltiazem.   On telemetry she was noted to have paroxysmal atrial fibrillation with rapid ventricular response. Since she did not tolerate the diltiazem, no further calcium channel blockers were used. She was started on metoprolol and tolerated this medication well. She was having episodic dizziness, nausea and headaches. These were not related to the atrial fibrillation. She was continued on home medications and is to follow up with Dr Melina Copa for these issues.  By 02/16/1998 410, she was feeling much better. She was ambulating without chest pain or shortness of breath. She was evaluated by Dr. [name] and considered stable for discharge, to follow up as an outpatient in [place].   Labs:   Lab Results  Component Value Date   WBC 10.2 02/15/2012   HGB 13.3 02/15/2012   HCT 40.5 02/15/2012   MCV 90.2  02/15/2012   PLT 317 02/15/2012     Lab 02/17/12 0457  NA 135  K 3.5  CL 92*  CO2 27  BUN 13  CREATININE 0.96  CALCIUM 10.0  PROT --  BILITOT --  ALKPHOS --  ALT --  AST --  GLUCOSE 210*   Lab Results  Component Value Date   TSH 1.704 02/16/2012    Basename 02/16/12 0916 02/16/12 0350 02/15/12 2221  CKTOTAL -- -- --  CKMB -- -- --  CKMBINDEX -- -- --  TROPONINI <0.30 <0.30 <0.30   Radiology: Dg Chest Portable 1 View 02/15/2012  *RADIOLOGY REPORT*  Clinical Data: Chest pain and weakness.  PORTABLE CHEST - 1 VIEW  Comparison: None.  Findings: Trachea is midline.  Heart size normal.  Lungs are clear but low in volume.  No pleural fluid.  IMPRESSION: Low lung volumes.  No acute findings.   Original Report Authenticated By: Lorin Picket, M.D.    EKG:  15-Feb-2012 14:30:20 [facility] ED ROUTINE RECORD SINUS RHYTHM ~ normal P axis, V-rate 50- 99 CONSIDER ANTERIOR INFARCT ~ diminished R <0.2mV V3 BORDERLINE T ABNORMALITIES, ANTERIOR LEADS ~ T flat or neg, V2-V4 Standard 12 Lead Report ~ Not Confirmed Borderline ECG 19mm/s 56mm/mV 150Hz  8.0.1 12SL 235 CID: [id number] Referred by: Unconfirmed Vent. rate 71 BPM PR interval 200 ms QRS duration 74 ms QT/QTc 380/413 ms P-R-T axes -7 40 20  Echo: 02/16/2012  Conclusions - Left ventricle: The cavity size was normal. Wall thickness was increased in a pattern of mild LVH. Systolic function was mildly reduced. The estimated ejection fraction was  50%, in the range of 45% to 50%. Diffuse hypokinesis. Doppler parameters are consistent with abnormal left ventricular relaxation (grade 1 diastolic dysfunction). - Mitral valve: Calcified annulus. - Left atrium: The atrium was mildly dilated.  FOLLOW UP PLANS AND APPOINTMENTS Allergies  Allergen Reactions  . Cefprozil     REACTION: unknown reaction  . Diltiazem     Nausea and chest pain  . Penicillins     REACTION: UNKNOWN REACTION  . Prednisone     REACTION:  UNKNOWN REACTION  . Vancomycin     REACTION: UNKNOWN REACTION     Medication List     As of 02/17/2012 11:30 AM    STOP taking these medications         diltiazem 120 MG 24 hr capsule   Commonly known as: DILACOR XR      simvastatin 40 MG tablet   Commonly known as: ZOCOR      TAKE these medications         acetaminophen 500 MG tablet   Commonly known as: TYLENOL   Take 500 mg by mouth as needed.      ALIGN 4 MG Caps   Take 4 mg by mouth daily.      aspirin 81 MG tablet   Take 81 mg by mouth daily.      BYETTA 5 MCG PEN 5 MCG/0.02ML Soln   Generic drug: exenatide   Inject 5 mcg into the skin 2 (two) times daily with a meal.      cholecalciferol 1000 UNITS tablet   Commonly known as: VITAMIN D   Take 5,000 Units by mouth daily.      clonazePAM 0.5 MG tablet   Commonly known as: KLONOPIN   Take 0.5 mg by mouth as needed.      estrogens conjugated (synthetic A) 0.9 MG tablet   Commonly known as: CENESTIN   Take 0.9 mg by mouth daily.      glipiZIDE 10 MG 24 hr tablet   Commonly known as: GLUCOTROL XL   Take 10 mg by mouth daily.      hydrochlorothiazide 25 MG tablet   Commonly known as: HYDRODIURIL   Take 25 mg by mouth daily.      lansoprazole 30 MG capsule   Commonly known as: PREVACID   Take 30 mg by mouth daily.      levothyroxine 137 MCG tablet   Commonly known as: SYNTHROID, LEVOTHROID   Take 137 mcg by mouth daily.      metFORMIN 500 MG 24 hr tablet   Commonly known as: GLUCOPHAGE-XR   Take 500 mg by mouth daily with supper.      metoprolol tartrate 25 MG tablet   Commonly known as: LOPRESSOR   Take 1 tablet (25 mg total) by mouth 2 (two) times daily.      MULTI-BETIC DIABETES Tabs   Take 1 tablet by mouth daily.      Rivaroxaban 20 MG Tabs   Commonly known as: XARELTO   Take 1 tablet (20 mg total) by mouth daily.      traMADol 50 MG tablet   Commonly known as: ULTRAM   Take 100 mg by mouth every 6 (six) hours as needed.           Discharge Orders    Future Appointments: Provider: Department: Dept Phone: Center:   03/03/2012 1:40 PM Aurora Mask, PA [address] (near [place]) [phone] [email or web address]     Future Orders Please Complete By  Expires   Diet - low sodium heart healthy      Diet Carb Modified      Increase activity slowly        Follow-up Information    Follow up with SERPE, EUGENE, PA. On 03/03/2012. (See for Dr Percival Spanish at 1:40 pm)    Contact information:   [address], [address] [place] [address] [phone]          [place] UP APPOINTMENTS  Time spent with patient to include physician time: 38 min Signed: Rosaria Ferries 02/17/2012, 11:30 AM Co-Sign MD

## 2012-02-18 ENCOUNTER — Telehealth: Payer: Self-pay | Admitting: *Deleted

## 2012-02-18 NOTE — Telephone Encounter (Signed)
Transitional call, medications reviewed and pt has them available in home, low sodium diet reviewed, reminded pt of  f/u app. Told pt to call with questions and concerns, number provided, agreed to plan.

## 2012-02-29 ENCOUNTER — Telehealth: Payer: Self-pay | Admitting: *Deleted

## 2012-02-29 NOTE — Telephone Encounter (Signed)
Message left on voice mail - wanting to know what she can take for gas, along with her other medications.  Returned call - No answer.

## 2012-03-01 ENCOUNTER — Telehealth: Payer: Self-pay | Admitting: Cardiology

## 2012-03-01 NOTE — Telephone Encounter (Signed)
Appt cancelled in [place] and rescheduled with L. Gerhardt in [place] per pt request.

## 2012-03-01 NOTE — Telephone Encounter (Signed)
Per pt daughter she was to be seen this week by Dr. [name] not in [place] office I'm not sure what to tell her Dr. [name] dose not have anything this week

## 2012-03-03 ENCOUNTER — Ambulatory Visit: Payer: Medicare PPO | Admitting: Physician Assistant

## 2012-03-03 NOTE — Telephone Encounter (Signed)
Patient has OV scheduled 2/7 - [address] office.

## 2012-03-04 ENCOUNTER — Ambulatory Visit (INDEPENDENT_AMBULATORY_CARE_PROVIDER_SITE_OTHER): Payer: Medicare PPO | Admitting: Nurse Practitioner

## 2012-03-04 ENCOUNTER — Encounter: Payer: Self-pay | Admitting: Nurse Practitioner

## 2012-03-04 VITALS — BP 126/70 | HR 70 | Resp 16 | Ht 62.0 in | Wt 201.4 lb

## 2012-03-04 DIAGNOSIS — I4891 Unspecified atrial fibrillation: Secondary | ICD-10-CM

## 2012-03-04 DIAGNOSIS — I480 Paroxysmal atrial fibrillation: Secondary | ICD-10-CM

## 2012-03-04 NOTE — Patient Instructions (Addendum)
Stay on your current medicines  Get back to Dr. Melina Copa to discuss your spells of headaches/nausea/diarrhea  We will see you back in 3 months  Call the [place] office at [phone] if you have any questions, problems or concerns.
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
## 2012-03-07 ENCOUNTER — Telehealth: Payer: Self-pay | Admitting: Cardiology

## 2012-03-07 MED ORDER — METOPROLOL TARTRATE 25 MG PO TABS: ORAL_TABLET | ORAL | Status: DC

## 2012-03-07 NOTE — Telephone Encounter (Signed)
Continue 25 mg metoprolol in the morning and reduce evening dose to 12.5 mg  Allison Rami [name] MD, [facility]

## 2012-03-07 NOTE — Telephone Encounter (Signed)
Patient states since she started  Taken the Metoprolol 25 mg twice a day.  The night dose make her be nauseated and dizzy the next day. Pt did not take the evening dose in the evening this past Saturday, and  on  sunday she was able to go to church because she was feeling fine, she had no nauseas or dizziness.

## 2012-03-07 NOTE — Telephone Encounter (Signed)
New problem   Seen in the office on 2/7 . Need to discuss medication .    C/O can not sleep , nausea, dizziness,

## 2012-03-07 NOTE — Telephone Encounter (Signed)
Pt is aware to take one 25 mg tablet of metoprolol in the morning and 12.5 mg (1/2) tablet in the afternoon. Pt verbalized understanding.

## 2012-03-07 NOTE — Addendum Note (Signed)
Addended by: Lynann Bologna on: 03/07/2012 01:41 PM   Modules accepted: Orders

## 2012-03-13 ENCOUNTER — Telehealth: Payer: Self-pay | Admitting: Physician Assistant

## 2012-03-13 NOTE — Telephone Encounter (Signed)
Pt called earlier, concerned that she had taken extra tab of 25 mg Lopressor. She was asymptomatic. I advised her to check vitals in about an hour. She called with BP 144/79 and HR 71. I advised her to hold this evening's scheduled dose of Lopressor, and to then resume previous schedule in AM. She appreciated call back and recommendations.

## 2012-03-14 ENCOUNTER — Encounter: Payer: Self-pay | Admitting: Gastroenterology

## 2012-03-28 ENCOUNTER — Ambulatory Visit: Payer: Medicare PPO | Admitting: Gastroenterology

## 2012-03-28 ENCOUNTER — Telehealth: Payer: Self-pay | Admitting: Gastroenterology

## 2012-03-28 NOTE — Telephone Encounter (Signed)
Do not charge per Dr. Fuller Plan.

## 2012-04-07 ENCOUNTER — Encounter: Payer: Self-pay | Admitting: Gastroenterology

## 2012-04-18 ENCOUNTER — Ambulatory Visit: Payer: Medicare PPO | Admitting: Gastroenterology

## 2012-04-22 ENCOUNTER — Encounter: Payer: Self-pay | Admitting: *Deleted

## 2012-05-02 ENCOUNTER — Encounter: Payer: Self-pay | Admitting: Gastroenterology

## 2012-05-02 ENCOUNTER — Ambulatory Visit (INDEPENDENT_AMBULATORY_CARE_PROVIDER_SITE_OTHER): Payer: Medicare PPO | Admitting: Gastroenterology

## 2012-05-02 VITALS — BP 114/60 | HR 76 | Ht 61.5 in | Wt 204.5 lb

## 2012-05-02 DIAGNOSIS — Z7901 Long term (current) use of anticoagulants: Secondary | ICD-10-CM

## 2012-05-02 DIAGNOSIS — Z86010 Personal history of colonic polyps: Secondary | ICD-10-CM

## 2012-05-02 DIAGNOSIS — K219 Gastro-esophageal reflux disease without esophagitis: Secondary | ICD-10-CM

## 2012-05-02 DIAGNOSIS — R1319 Other dysphagia: Secondary | ICD-10-CM

## 2012-05-02 DIAGNOSIS — K589 Irritable bowel syndrome without diarrhea: Secondary | ICD-10-CM

## 2012-05-02 MED ORDER — LANSOPRAZOLE 30 MG PO CPDR: 30.0000 mg | DELAYED_RELEASE_CAPSULE | Freq: Two times a day (BID) | ORAL | Status: DC

## 2012-05-02 MED ORDER — DICYCLOMINE HCL 10 MG PO CAPS: 10.0000 mg | ORAL_CAPSULE | Freq: Three times a day (TID) | ORAL | Status: DC

## 2012-05-02 NOTE — Progress Notes (Signed)
History of Present Illness: This is a 72 year old female previously followed by Dr. Laural Golden and Dr. Gala Romney for many years. She is accompanied by her daughter. She has multiple comorbidities and multiple gastrointestinal problems including gastroparesis, GERD, dysphagia, history of adenomatous colon polyps, IBS and diverticulosis. She underwent upper endoscopy and colonoscopy by Dr. Gala Romney in January 2011. She has had problems with recurrent solid food dysphagia over the years and has undergone endoscopies with dilation which have provided temporary relief of symptoms. Barium esophagram in 2006 did not demonstrate a stricture although there was transient holdup of a barium tablet. She complains of a fullness in her throat and occasional difficulty swallowing solid foods and pills. She feels full after meals. All these symptoms have improved since making slight modifications in her diet to avoid greasy and spicy foods over the past few months. She also has diarrhea alternating with constipation more recently she has had mild constipation phase. She notes crampy periumbilical pain associated with bowel movements on occasion. She carries a diagnosis of irritable bowel syndrome. Denies weight loss, constipation, diarrhea, change in stool caliber, melena, hematochezia, nausea, vomiting, dysphagia, reflux symptoms, chest pain.  Review of Systems: Pertinent positive and negative review of systems were noted in the above HPI section. All other review of systems were otherwise negative.  Current Medications, Allergies, Past Medical History, Past Surgical History, Family History and Social History were reviewed in [employer] record.  Physical Exam: General: Well developed , well nourished, obese, no acute distress Head: Normocephalic and atraumatic Eyes:  sclerae anicteric, EOMI Ears: Normal auditory acuity Mouth: No deformity or lesions Neck: Supple, no masses or thyromegaly Lungs: Clear  throughout to auscultation Heart: Regular rate and rhythm; no murmurs, rubs or bruits Abdomen: Soft, non tender and non distended. No masses, hepatosplenomegaly or hernias noted. Normal Bowel sounds Musculoskeletal: Symmetrical with no gross deformities  Skin: No lesions on visible extremities Pulses:  Normal pulses noted Extremities: No clubbing, cyanosis, edema or deformities noted Neurological: Alert oriented x 4, grossly nonfocal Cervical Nodes:  No significant cervical adenopathy Inguinal Nodes: No significant inguinal adenopathy Psychological:  Alert and cooperative. Normal mood and affect  Assessment and Recommendations:  1. Dysphagia, chronic GERD. EGD by Dr. Gala Romney in 2011 without a stricture or any other esophageal pathology noted. A 58 Maloney dilator was passed and a superficial tear was noted at the upper esophageal sphincter. Her dysphasia may be related to refractory GERD or a motility disorder. Schedule barium esophagram with a barium tablet to evaluate for a possible stricture. Intensify all antireflux measures and increase Prevacid to 30 mg twice a day. Given her comorbidities and chronic anticoagulation needs it would be ideal to avoid procedures that require her anticoagulation to be held, if at all possible.  2. Personal history of adenomatous colon polyps. Family history of colon cancer. Her last colonoscopy by Dr. Gala Romney in 2011 showed one adenomatous colon polyp. Surveillance colonoscopy in January 2016 if her health status is appropriate for surveillance.   3. Gastroparesis and GERD. Long-term gastroparesis diet and antireflux measures. Increase lansoprazole to 30 mg twice daily.  4. Chronic anticoagulation with Xarelto for afib.  5. Irritable bowel syndrome. Maintain a high fiber diet with fiber supplements and adequate water intake. Begin dicyclomine 10 mg 4 times a day when necessary.

## 2012-05-02 NOTE — Patient Instructions (Addendum)
We have sent the following medications to your pharmacy for you to pick up at your convenience: Prevacid to increase to twice daily, Bentyl.   You have been scheduled for a Barium Esophogram at [facility] (1st floor of the hospital) on 05/05/12 at 9:30am. Please arrive 15 minutes prior to your appointment for registration. Make certain not to have anything to eat or drink 6 hours prior to your test. If you need to reschedule for any reason, please contact radiology at [phone] to do so. __________________________________________________________________ A barium swallow is an examination that concentrates on views of the esophagus. This tends to be a double contrast exam (barium and two liquids which, when combined, create a gas to distend the wall of the oesophagus) or single contrast (non-ionic iodine based). The study is usually tailored to your symptoms so a good history is essential. Attention is paid during the study to the form, structure and configuration of the esophagus, looking for functional disorders (such as aspiration, dysphagia, achalasia, motility and reflux) EXAMINATION You may be asked to change into a gown, depending on the type of swallow being performed. A radiologist and radiographer will perform the procedure. The radiologist will advise you of the type of contrast selected for your procedure and direct you during the exam. You will be asked to stand, sit or lie in several different positions and to hold a small amount of fluid in your mouth before being asked to swallow while the imaging is performed .In some instances you may be asked to swallow barium coated marshmallows to assess the motility of a solid food bolus. The exam can be recorded as a digital or video fluoroscopy procedure. POST PROCEDURE It will take 1-2 days for the barium to pass through your system. To facilitate this, it is important, unless otherwise directed, to increase your fluids for the next  24-48hrs and to resume your normal diet.  This test typically takes about 30 minutes to perform. __________________________________________________________________________________   Thank you for choosing me and [facility].  Pricilla Riffle. Dagoberto Ligas., MD., Marval Regal  cc: Octavio Graves, DO

## 2012-05-05 ENCOUNTER — Telehealth: Payer: Self-pay

## 2012-05-05 ENCOUNTER — Ambulatory Visit (HOSPITAL_COMMUNITY)
Admission: RE | Admit: 2012-05-05 | Discharge: 2012-05-05 | Disposition: A | Discharge status: Home / Self Care | Payer: Medicare PPO | Source: Ambulatory Visit | Attending: Gastroenterology | Admitting: Gastroenterology

## 2012-05-05 DIAGNOSIS — K219 Gastro-esophageal reflux disease without esophagitis: Secondary | ICD-10-CM | POA: Insufficient documentation

## 2012-05-05 DIAGNOSIS — K449 Diaphragmatic hernia without obstruction or gangrene: Secondary | ICD-10-CM | POA: Insufficient documentation

## 2012-05-05 DIAGNOSIS — R1310 Dysphagia, unspecified: Secondary | ICD-10-CM | POA: Insufficient documentation

## 2012-05-05 DIAGNOSIS — R1319 Other dysphagia: Secondary | ICD-10-CM

## 2012-05-05 NOTE — Telephone Encounter (Signed)
05/05/2012    RE: [name] DOB: May 29, 1940 MRN: [id number]   Dear Dr. [name],    We have scheduled the above patient for an endoscopic procedure. Our records show that she is on anticoagulation therapy.   Please advise as to how long the patient may come off her therapy of Xeralto prior to the procedure, which is scheduled for 05/25/12.  Please route back to Barb Merino, RN  Sincerely,  Barb Merino

## 2012-05-06 NOTE — Telephone Encounter (Signed)
May stop Xarelto 48 hours before endoscopic procedure.  Shalanda Brogden [name] MD, [facility]

## 2012-05-06 NOTE — Telephone Encounter (Signed)
Patient notified of Dr. Doug Sou response.  She is advised that we will provide her the instructions when she comes in for the pre-visit on 05/18/12

## 2012-05-18 ENCOUNTER — Ambulatory Visit (AMBULATORY_SURGERY_CENTER): Payer: Medicare PPO | Admitting: *Deleted

## 2012-05-18 VITALS — Ht 62.0 in | Wt 204.8 lb

## 2012-05-18 DIAGNOSIS — R1310 Dysphagia, unspecified: Secondary | ICD-10-CM

## 2012-05-18 NOTE — Progress Notes (Signed)
Denies any allergies to eggs or soy products. Denies any complications with anesthesia or sedation.

## 2012-05-19 ENCOUNTER — Encounter: Payer: Self-pay | Admitting: Gastroenterology

## 2012-05-25 ENCOUNTER — Encounter: Payer: Self-pay | Admitting: Gastroenterology

## 2012-05-25 ENCOUNTER — Other Ambulatory Visit: Payer: Self-pay | Admitting: Gastroenterology

## 2012-05-25 ENCOUNTER — Ambulatory Visit (AMBULATORY_SURGERY_CENTER): Payer: Medicare PPO | Admitting: Gastroenterology

## 2012-05-25 VITALS — BP 132/83 | HR 65 | Temp 98.3°F | Resp 25 | Ht 62.0 in | Wt 204.0 lb

## 2012-05-25 DIAGNOSIS — R933 Abnormal findings on diagnostic imaging of other parts of digestive tract: Secondary | ICD-10-CM

## 2012-05-25 DIAGNOSIS — R1310 Dysphagia, unspecified: Secondary | ICD-10-CM

## 2012-05-25 DIAGNOSIS — K219 Gastro-esophageal reflux disease without esophagitis: Secondary | ICD-10-CM

## 2012-05-25 MED ORDER — SODIUM CHLORIDE 0.9 % IV SOLN: 500.0000 mL | INTRAVENOUS | Status: DC

## 2012-05-25 NOTE — Op Note (Signed)
[place]  [address] [place], [address]   ENDOSCOPY PROCEDURE REPORT  PATIENT: Allison, [name]  MR#: [id number] BIRTHDATE: 1940-12-26 , 64  yrs. old GENDER: Female ENDOSCOPIST: Ladene Artist, MD, [facility] REFERRED BY:  Octavio Graves, DO PROCEDURE DATE:  05/25/2012 PROCEDURE:  EGD, diagnostic and Savary dilation of esophagus ASA CLASS:     Class III INDICATIONS:  Dysphagia.   History of esophageal reflux.   abnormal BA esophagram results. MEDICATIONS: MAC sedation, administered by CRNA and propofol (Diprivan) 150mg  IV TOPICAL ANESTHETIC: none DESCRIPTION OF PROCEDURE: After the risks benefits and alternatives of the procedure were thoroughly explained, informed consent was obtained.  The LB GIF-H180 [id number] endoscope was introduced through the mouth and advanced to the second portion of the duodenum without limitations.  The instrument was slowly withdrawn as the mucosa was fully examined.  ESOPHAGUS: The mucosa of the esophagus appeared normal. STOMACH: The mucosa and folds of the stomach appeared normal. DUODENUM: The duodenal mucosa showed no abnormalities in the bulb and second portion of the duodenum.  Retroflexed views revealed no abnormalities. A guidewire was placed and the scope was then withdrawn from the patient. A 17 mm Savar dilator was passed over the guidewire for dysphagia without a stricture noted with no resistance and no heme and the procedure completed.  COMPLICATIONS: There were no complications.  ENDOSCOPIC IMPRESSION: 1.   The EGD appeared normal; empiric dilation performed  RECOMMENDATIONS: 1.  Anti-reflux regimen and gastroparesis diet long term 2.  Continue PPI BID long term 3.  Post dilation instructions 4.  Resume Xarelto tomorrow   eSigned:  Ladene Artist, MD, [facility] 05/25/2012 1:46 PM

## 2012-05-25 NOTE — Patient Instructions (Addendum)
YOU HAD AN ENDOSCOPIC PROCEDURE TODAY AT [facility]: Refer to the procedure report that was given to you for any specific questions about what was found during the examination.  If the procedure report does not answer your questions, please call your gastroenterologist to clarify.  If you requested that your care partner not be given the details of your procedure findings, then the procedure report has been included in a sealed envelope for you to review at your convenience later.  YOU SHOULD EXPECT: Some feelings of bloating in the abdomen. Passage of more gas than usual.  Walking can help get rid of the air that was put into your GI tract during the procedure and reduce the bloating. If you had a lower endoscopy (such as a colonoscopy or flexible sigmoidoscopy) you may notice spotting of blood in your stool or on the toilet paper. If you underwent a bowel prep for your procedure, then you may not have a normal bowel movement for a few days.  DIET: Because you were dilated, you may not have anything by mouth until 3pm.   At that time, you may have clear liquids and then proceed to a SOFT DIET for the rest of the day.   This is important so you don't choke.  Drink plenty of fluids but you should avoid alcoholic beverages for 24 hours. CONTINUE YOUR GASTROPARESIS DIET LONGTERM; CONTINUE YOUR ACID REDUCING MEDICINE LONGTERM; RESUME XARELTO TOMORROW.  ACTIVITY: Your care partner should take you home directly after the procedure.  You should plan to take it easy, moving slowly for the rest of the day.  You can resume normal activity the day after the procedure however you should NOT DRIVE or use heavy machinery for 24 hours (because of the sedation medicines used during the test).    SYMPTOMS TO REPORT IMMEDIATELY: A gastroenterologist can be reached at any hour.  During normal business hours, 8:30 AM to 5:00 PM Monday through Friday, call [phone].  After hours and on weekends, please  call the GI answering service at [phone] who will take a message and have the physician on call contact you.   Following upper endoscopy (EGD)  Vomiting of blood or coffee ground material  New chest pain or pain under the shoulder blades  Painful or persistently difficult swallowing  New shortness of breath  Fever of 100F or higher  Black, tarry-looking stools  FOLLOW UP: If any biopsies were taken you will be contacted by phone or by letter within the next 1-3 weeks.  Call your gastroenterologist if you have not heard about the biopsies in 3 weeks.  Our staff will call the home number listed on your records the next business day following your procedure to check on you and address any questions or concerns that you may have at that time regarding the information given to you following your procedure. This is a courtesy call and so if there is no answer at the home number and we have not heard from you through the emergency physician on call, we will assume that you have returned to your regular daily activities without incident.  SIGNATURES/CONFIDENTIALITY: You and/or your care partner have signed paperwork which will be entered into your electronic medical record.  These signatures attest to the fact that that the information above on your After Visit Summary has been reviewed and is understood.  Full responsibility of the confidentiality of this discharge information lies with you and/or your care-partner.

## 2012-05-25 NOTE — Progress Notes (Signed)
Called to room to assist during endoscopic procedure.  Patient ID and intended procedure confirmed with present staff. Received instructions for my participation in the procedure from the performing physician.  

## 2012-05-25 NOTE — Progress Notes (Signed)
Patient did not have preoperative order for IV antibiotic SSI prophylaxis. (G8918)  Patient did not experience any of the following events: a burn prior to discharge; a fall within the facility; wrong site/side/patient/procedure/implant event; or a hospital transfer or hospital admission upon discharge from the facility. (G8907)  

## 2012-05-26 ENCOUNTER — Telehealth: Payer: Self-pay | Admitting: *Deleted

## 2012-05-26 NOTE — Telephone Encounter (Signed)
  Follow up Call-  Call back number 05/25/2012  Post procedure Call Back phone  # [phone]  Permission to leave phone message Yes     Patient questions:  Do you have a fever, pain , or abdominal swelling? no Pain Score  0 *  Have you tolerated food without any problems? yes  Have you been able to return to your normal activities? yes  Do you have any questions about your discharge instructions: Diet   no Medications  no Follow up visit  no  Do you have questions or concerns about your Care? no  Actions: * If pain score is 4 or above: No action needed, pain <4.  C/o chest soreness like before exam. Denies any severe pain or problems. Encouraged patient to call us back is she does not improve. She understands.

## 2012-06-08 ENCOUNTER — Encounter: Payer: Self-pay | Admitting: Cardiology

## 2012-06-08 ENCOUNTER — Ambulatory Visit (INDEPENDENT_AMBULATORY_CARE_PROVIDER_SITE_OTHER): Payer: Medicare PPO | Admitting: Cardiology

## 2012-06-08 VITALS — BP 109/82 | HR 102 | Ht 62.0 in | Wt 205.8 lb

## 2012-06-08 DIAGNOSIS — I480 Paroxysmal atrial fibrillation: Secondary | ICD-10-CM

## 2012-06-08 DIAGNOSIS — I4891 Unspecified atrial fibrillation: Secondary | ICD-10-CM

## 2012-06-08 MED ORDER — METOPROLOL TARTRATE 25 MG PO TABS: ORAL_TABLET | ORAL | Status: DC
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
## 2012-06-08 NOTE — Patient Instructions (Addendum)
Increase your metoprolol to one tablet twice a day.   Continue Xarelto, stop taking ASA  I will see you in 6 months.

## 2012-07-28 ENCOUNTER — Telehealth: Payer: Self-pay

## 2012-07-28 NOTE — Telephone Encounter (Signed)
Patient called no answer.No answering machine unable to leave a message.Spoke to Dr.Jordan yesterday 07/27/12 he spoke to Dr.Cynthia Melina Copa and she wants patient to have a cardiac cath.[name] called no answer.[place].

## 2012-08-03 ENCOUNTER — Telehealth: Payer: Self-pay | Admitting: Cardiology

## 2012-08-03 DIAGNOSIS — R0789 Other chest pain: Secondary | ICD-10-CM

## 2012-08-03 DIAGNOSIS — R0602 Shortness of breath: Secondary | ICD-10-CM

## 2012-08-03 NOTE — Telephone Encounter (Signed)
Follow Up     Returning call from yesterday. Please call back.

## 2012-08-03 NOTE — Telephone Encounter (Signed)
Follow up  ° ° ° °Returning call back to nurse  °

## 2012-08-03 NOTE — Telephone Encounter (Signed)
Returned call to patient she stated she was ready to schedule cardiac cath.Will call back on 08/05/12 with cath date.

## 2012-08-05 ENCOUNTER — Other Ambulatory Visit: Payer: Self-pay | Admitting: Cardiology

## 2012-08-05 DIAGNOSIS — R079 Chest pain, unspecified: Secondary | ICD-10-CM

## 2012-08-08 NOTE — Telephone Encounter (Signed)
Follow up  Pt is calling regarding setting up her heart cath.

## 2012-08-08 NOTE — Telephone Encounter (Signed)
Returned call to patient, cardiac cath scheduled with Dr.Jordan Monday 08/15/12 at [place] cath lab 8:30 am arrive at 7:30 am.Patient will have pre cath lab, and pick up cath instructions tomorrow 08/09/12.

## 2012-08-09 ENCOUNTER — Other Ambulatory Visit: Payer: Medicare PPO

## 2012-08-09 ENCOUNTER — Telehealth: Payer: Self-pay

## 2012-08-09 MED ORDER — ISOSORBIDE MONONITRATE ER 30 MG PO TB24: 30.0000 mg | ORAL_TABLET | Freq: Every day | ORAL | Status: DC

## 2012-08-09 NOTE — Telephone Encounter (Signed)
Patient called was told spoke to [place] in pre cert she stated patient's insurance will not pay for a cardiac cath,will need a stress test first.Spoke to patient's daughter she stated mother is having chest heaviness and sob with the least exertion.Spoke to Truitt Merle NP she advised to start Imdur 30 mg daily.Appointment scheduled with Dr.Jordan 08/16/12.Advised if has chest pain go to ER.

## 2012-08-09 NOTE — Telephone Encounter (Signed)
[place] cath lab called patient's 08/15/12 cath with Dr.Jordan cancelled.

## 2012-08-10 ENCOUNTER — Emergency Department (HOSPITAL_COMMUNITY)
Admission: EM | Admit: 2012-08-10 | Discharge: 2012-08-10 | Disposition: A | Discharge status: Home / Self Care | Payer: Medicare HMO | Attending: Emergency Medicine | Admitting: Emergency Medicine

## 2012-08-10 ENCOUNTER — Emergency Department (HOSPITAL_COMMUNITY): Payer: Medicare HMO

## 2012-08-10 ENCOUNTER — Telehealth: Payer: Self-pay

## 2012-08-10 ENCOUNTER — Encounter (HOSPITAL_COMMUNITY): Payer: Self-pay

## 2012-08-10 DIAGNOSIS — I10 Essential (primary) hypertension: Secondary | ICD-10-CM | POA: Insufficient documentation

## 2012-08-10 DIAGNOSIS — Z8669 Personal history of other diseases of the nervous system and sense organs: Secondary | ICD-10-CM | POA: Insufficient documentation

## 2012-08-10 DIAGNOSIS — E039 Hypothyroidism, unspecified: Secondary | ICD-10-CM | POA: Insufficient documentation

## 2012-08-10 DIAGNOSIS — I4891 Unspecified atrial fibrillation: Secondary | ICD-10-CM | POA: Insufficient documentation

## 2012-08-10 DIAGNOSIS — E785 Hyperlipidemia, unspecified: Secondary | ICD-10-CM | POA: Insufficient documentation

## 2012-08-10 DIAGNOSIS — F3289 Other specified depressive episodes: Secondary | ICD-10-CM | POA: Insufficient documentation

## 2012-08-10 DIAGNOSIS — Z9851 Tubal ligation status: Secondary | ICD-10-CM | POA: Insufficient documentation

## 2012-08-10 DIAGNOSIS — F411 Generalized anxiety disorder: Secondary | ICD-10-CM | POA: Insufficient documentation

## 2012-08-10 DIAGNOSIS — K449 Diaphragmatic hernia without obstruction or gangrene: Secondary | ICD-10-CM | POA: Insufficient documentation

## 2012-08-10 DIAGNOSIS — Z8719 Personal history of other diseases of the digestive system: Secondary | ICD-10-CM | POA: Insufficient documentation

## 2012-08-10 DIAGNOSIS — K3184 Gastroparesis: Secondary | ICD-10-CM | POA: Insufficient documentation

## 2012-08-10 DIAGNOSIS — M5030 Other cervical disc degeneration, unspecified cervical region: Secondary | ICD-10-CM | POA: Insufficient documentation

## 2012-08-10 DIAGNOSIS — J069 Acute upper respiratory infection, unspecified: Secondary | ICD-10-CM | POA: Insufficient documentation

## 2012-08-10 DIAGNOSIS — M1990 Unspecified osteoarthritis, unspecified site: Secondary | ICD-10-CM | POA: Insufficient documentation

## 2012-08-10 DIAGNOSIS — R079 Chest pain, unspecified: Secondary | ICD-10-CM | POA: Insufficient documentation

## 2012-08-10 DIAGNOSIS — Z880 Allergy status to penicillin: Secondary | ICD-10-CM | POA: Insufficient documentation

## 2012-08-10 DIAGNOSIS — E559 Vitamin D deficiency, unspecified: Secondary | ICD-10-CM | POA: Insufficient documentation

## 2012-08-10 DIAGNOSIS — E669 Obesity, unspecified: Secondary | ICD-10-CM | POA: Insufficient documentation

## 2012-08-10 DIAGNOSIS — R0602 Shortness of breath: Secondary | ICD-10-CM | POA: Insufficient documentation

## 2012-08-10 DIAGNOSIS — R51 Headache: Secondary | ICD-10-CM | POA: Insufficient documentation

## 2012-08-10 DIAGNOSIS — Z9889 Other specified postprocedural states: Secondary | ICD-10-CM | POA: Insufficient documentation

## 2012-08-10 DIAGNOSIS — K589 Irritable bowel syndrome without diarrhea: Secondary | ICD-10-CM | POA: Insufficient documentation

## 2012-08-10 DIAGNOSIS — J45909 Unspecified asthma, uncomplicated: Secondary | ICD-10-CM | POA: Insufficient documentation

## 2012-08-10 DIAGNOSIS — E119 Type 2 diabetes mellitus without complications: Secondary | ICD-10-CM | POA: Insufficient documentation

## 2012-08-10 DIAGNOSIS — Z860100 Personal history of colon polyps, unspecified: Secondary | ICD-10-CM | POA: Insufficient documentation

## 2012-08-10 DIAGNOSIS — F329 Major depressive disorder, single episode, unspecified: Secondary | ICD-10-CM | POA: Insufficient documentation

## 2012-08-10 DIAGNOSIS — K648 Other hemorrhoids: Secondary | ICD-10-CM | POA: Insufficient documentation

## 2012-08-10 DIAGNOSIS — Z79899 Other long term (current) drug therapy: Secondary | ICD-10-CM | POA: Insufficient documentation

## 2012-08-10 LAB — CBC WITH DIFFERENTIAL/PLATELET
Basophils Absolute: 0 10*3/uL (ref 0.0–0.1)
HCT: 37.6 % (ref 36.0–46.0)
Hemoglobin: 11.9 g/dL — ABNORMAL LOW (ref 12.0–15.0)
Lymphocytes Relative: 23 % (ref 12–46)
Lymphs Abs: 2.7 10*3/uL (ref 0.7–4.0)
Monocytes Absolute: 0.8 10*3/uL (ref 0.1–1.0)
Monocytes Relative: 7 % (ref 3–12)
Neutro Abs: 7.9 10*3/uL — ABNORMAL HIGH (ref 1.7–7.7)
RBC: 4.26 MIL/uL (ref 3.87–5.11)
RDW: 14.5 % (ref 11.5–15.5)
WBC: 11.6 10*3/uL — ABNORMAL HIGH (ref 4.0–10.5)

## 2012-08-10 LAB — COMPREHENSIVE METABOLIC PANEL
AST: 22 U/L (ref 0–37)
CO2: 27 mEq/L (ref 19–32)
Chloride: 93 mEq/L — ABNORMAL LOW (ref 96–112)
Creatinine, Ser: 1.02 mg/dL (ref 0.50–1.10)
GFR calc non Af Amer: 55 mL/min — ABNORMAL LOW (ref >90)
Glucose, Bld: 241 mg/dL — ABNORMAL HIGH (ref 70–99)
Total Bilirubin: 0.3 mg/dL (ref 0.3–1.2)

## 2012-08-10 LAB — POCT I-STAT TROPONIN I: Troponin i, poc: 0.02 ng/mL (ref 0.00–0.08)

## 2012-08-10 MED ORDER — MORPHINE SULFATE 4 MG/ML IJ SOLN
4.0000 mg | Freq: Once | INTRAMUSCULAR | Status: AC
  Administered 2012-08-10: 4 mg via INTRAVENOUS
  Filled 2012-08-10: qty 1

## 2012-08-10 NOTE — ED Notes (Signed)
Pt was scheduled for a cardiac cath next week and insurance denied her, her medication was changed today and this am she felt like someone was hitting her in the back of the head and back.

## 2012-08-10 NOTE — Telephone Encounter (Signed)
Received a call from Leafy Ro at [facility] she assisted me with getting patient's cardiac cath approved for 08/15/12.Prior Authorization # [id number].Cardiac Cath rescheduled for Monday 08/15/12 at [place] cath lab 8:30 am arrive at 7:30am.[name] [name] called she will come by office today or tomorrow to pick up instructions.

## 2012-08-10 NOTE — Telephone Encounter (Signed)
Spoke to patient's daughter [name] she stated she took mother to [facility] ER last night with chest pain and jaw pain.Stated ekgs,lab done all normal.Stated they stayed at er all night and was sent home this morning.Advised to keep appointment with Dr.Jordan 08/16/12.Advised to go back to ER if needed.

## 2012-08-10 NOTE — ED Provider Notes (Signed)
Medical screening examination/treatment/procedure(s) were conducted as a shared visit with non-physician practitioner(s) and myself.  I personally evaluated the patient during the encounter  Major complaint is headache from the Imdur.  I recommended that she stop taking her Imdur and call our cardiology office this morning.  She does report some ongoing numbness and pain in her chest and arms.  This is been going on for some time.  She is currently working with her cardiology team regarding upcoming cardiac catheterization.  EKG and troponin x2 were negative in the ER.  She has no active chest pain at this time.  Discharge home in good condition.  I've asked that she call her cardiologist tomorrow   Hoy Morn, MD 08/10/12 [phone]
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
## 2012-08-10 NOTE — Telephone Encounter (Signed)
Spoke to daughter [name] yesterday 08/09/12 she stated she called [employer] and spoke to Leafy Ro mother's health care coordinator about approving cardiac cath.She wanted me to call [employer] to get cardiac cath approved.[employer] called 08/09/12 [phone],transferred 3 times trying to get cath approved,was given a tracking # [id number].While being transferred after 3 times phone disconnected.Unable to call back after 5:00 pm.   [employer] called 08/10/12 all the information given about patient yesterday was not found a reference # given [id number].Unable to get cath approved.Leafy Ro called [phone] no answer.[place].

## 2012-08-11 ENCOUNTER — Other Ambulatory Visit (INDEPENDENT_AMBULATORY_CARE_PROVIDER_SITE_OTHER): Payer: Medicare HMO

## 2012-08-11 DIAGNOSIS — R0602 Shortness of breath: Secondary | ICD-10-CM

## 2012-08-11 DIAGNOSIS — R0789 Other chest pain: Secondary | ICD-10-CM

## 2012-08-11 LAB — BASIC METABOLIC PANEL
CO2: 25 mEq/L (ref 19–32)
Calcium: 9.6 mg/dL (ref 8.4–10.5)
Glucose, Bld: 201 mg/dL — ABNORMAL HIGH (ref 70–99)
Potassium: 3.7 mEq/L (ref 3.5–5.1)
Sodium: 137 mEq/L (ref 135–145)

## 2012-08-11 LAB — CBC WITH DIFFERENTIAL/PLATELET
Basophils Absolute: 0 10*3/uL (ref 0.0–0.1)
Eosinophils Absolute: 0.3 10*3/uL (ref 0.0–0.7)
HCT: 38.5 % (ref 36.0–46.0)
Hemoglobin: 12.7 g/dL (ref 12.0–15.0)
Lymphs Abs: 3 10*3/uL (ref 0.7–4.0)
MCHC: 33 g/dL (ref 30.0–36.0)
Monocytes Absolute: 0.6 10*3/uL (ref 0.1–1.0)
Neutro Abs: 5.4 10*3/uL (ref 1.4–7.7)
RDW: 14.9 % — ABNORMAL HIGH (ref 11.5–14.6)

## 2012-08-15 ENCOUNTER — Inpatient Hospital Stay (HOSPITAL_BASED_OUTPATIENT_CLINIC_OR_DEPARTMENT_OTHER): Admission: RE | Admit: 2012-08-15 | Payer: Medicare HMO | Source: Ambulatory Visit | Admitting: Cardiology

## 2012-08-15 ENCOUNTER — Encounter (HOSPITAL_BASED_OUTPATIENT_CLINIC_OR_DEPARTMENT_OTHER): Payer: Self-pay

## 2012-08-15 ENCOUNTER — Encounter (HOSPITAL_BASED_OUTPATIENT_CLINIC_OR_DEPARTMENT_OTHER): Admission: RE | Payer: Self-pay | Source: Ambulatory Visit

## 2012-08-15 ENCOUNTER — Inpatient Hospital Stay (HOSPITAL_BASED_OUTPATIENT_CLINIC_OR_DEPARTMENT_OTHER)
Admission: RE | Admit: 2012-08-15 | Discharge: 2012-08-15 | Disposition: A | Discharge status: Home / Self Care | Payer: Medicare HMO | Source: Ambulatory Visit | Attending: Cardiology | Admitting: Cardiology

## 2012-08-15 ENCOUNTER — Encounter (HOSPITAL_BASED_OUTPATIENT_CLINIC_OR_DEPARTMENT_OTHER)
Admission: RE | Disposition: A | Discharge status: Home / Self Care | Payer: Self-pay | Source: Ambulatory Visit | Attending: Cardiology

## 2012-08-15 DIAGNOSIS — I2510 Atherosclerotic heart disease of native coronary artery without angina pectoris: Secondary | ICD-10-CM | POA: Insufficient documentation

## 2012-08-15 DIAGNOSIS — R0989 Other specified symptoms and signs involving the circulatory and respiratory systems: Secondary | ICD-10-CM | POA: Insufficient documentation

## 2012-08-15 DIAGNOSIS — I2789 Other specified pulmonary heart diseases: Secondary | ICD-10-CM | POA: Insufficient documentation

## 2012-08-15 DIAGNOSIS — E119 Type 2 diabetes mellitus without complications: Secondary | ICD-10-CM | POA: Insufficient documentation

## 2012-08-15 DIAGNOSIS — Z79899 Other long term (current) drug therapy: Secondary | ICD-10-CM | POA: Insufficient documentation

## 2012-08-15 DIAGNOSIS — I4891 Unspecified atrial fibrillation: Secondary | ICD-10-CM | POA: Insufficient documentation

## 2012-08-15 DIAGNOSIS — R0789 Other chest pain: Secondary | ICD-10-CM | POA: Insufficient documentation

## 2012-08-15 DIAGNOSIS — Z6837 Body mass index (BMI) 37.0-37.9, adult: Secondary | ICD-10-CM | POA: Insufficient documentation

## 2012-08-15 DIAGNOSIS — R0609 Other forms of dyspnea: Secondary | ICD-10-CM | POA: Insufficient documentation

## 2012-08-15 DIAGNOSIS — I10 Essential (primary) hypertension: Secondary | ICD-10-CM | POA: Insufficient documentation

## 2012-08-15 DIAGNOSIS — R079 Chest pain, unspecified: Secondary | ICD-10-CM

## 2012-08-15 LAB — POCT I-STAT 3, ART BLOOD GAS (G3+)
Acid-Base Excess: 1 mmol/L (ref 0.0–2.0)
pH, Arterial: 7.365 (ref 7.350–7.450)

## 2012-08-15 LAB — POCT I-STAT GLUCOSE
Glucose, Bld: 177 mg/dL — ABNORMAL HIGH (ref 70–99)
Operator id: 194801

## 2012-08-15 LAB — POCT I-STAT 3, VENOUS BLOOD GAS (G3P V)
Acid-Base Excess: 1 mmol/L (ref 0.0–2.0)
Bicarbonate: 28.3 mEq/L — ABNORMAL HIGH (ref 20.0–24.0)
pH, Ven: 7.33 — ABNORMAL HIGH (ref 7.250–7.300)
pO2, Ven: 38 mmHg (ref 30.0–45.0)

## 2012-08-15 SURGERY — JV LEFT HEART CATHETERIZATION WITH CORONARY ANGIOGRAM

## 2012-08-15 MED ORDER — ACETAMINOPHEN 325 MG PO TABS: 650.0000 mg | ORAL_TABLET | ORAL | Status: DC | PRN

## 2012-08-15 MED ORDER — SODIUM CHLORIDE 0.9 % IV SOLN: 1.0000 mL/kg/h | INTRAVENOUS | Status: DC

## 2012-08-15 MED ORDER — SODIUM CHLORIDE 0.9 % IJ SOLN: 3.0000 mL | INTRAMUSCULAR | Status: DC | PRN

## 2012-08-15 MED ORDER — ASPIRIN 81 MG PO CHEW
324.0000 mg | CHEWABLE_TABLET | ORAL | Status: AC
  Administered 2012-08-15: 324 mg via ORAL

## 2012-08-15 MED ORDER — SODIUM CHLORIDE 0.9 % IJ SOLN: 3.0000 mL | Freq: Two times a day (BID) | INTRAMUSCULAR | Status: DC

## 2012-08-15 MED ORDER — ONDANSETRON HCL 4 MG/2ML IJ SOLN: 4.0000 mg | Freq: Four times a day (QID) | INTRAMUSCULAR | Status: DC | PRN

## 2012-08-15 MED ORDER — RIVAROXABAN 20 MG PO TABS: 20.0000 mg | ORAL_TABLET | Freq: Every day | ORAL | Status: DC

## 2012-08-15 MED ORDER — SODIUM CHLORIDE 0.9 % IV SOLN
250.0000 mL | INTRAVENOUS | Status: DC | PRN
  Administered 2012-08-15: 250 mL via INTRAVENOUS

## 2012-08-15 NOTE — Progress Notes (Signed)
Venous sheath removed by [name] from right groin.

## 2012-08-15 NOTE — Progress Notes (Signed)
Bedrest begins @ 1045, tegaderm dressing applied to right groin site which is level 0.

## 2012-08-15 NOTE — Progress Notes (Signed)
Arterial sheath removed by Moishe Spice from right groin.
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
## 2012-08-15 NOTE — CV Procedure (Signed)
   Cardiac Catheterization Procedure Note  Name: [name] MRN: [id number] DOB: Dec 14, 1940  Procedure: Right Heart Cath, Left Heart Cath, Selective Coronary Angiography, LV angiography  Indication: 72 yo WF with mulitple cardiac risk factors presents with refractory chest pain and dyspnea.   Procedural Details: The right groin was prepped, draped, and anesthetized with 1% lidocaine. Using the modified Seldinger technique a 5 French sheath was placed in the right femoral artery and a 7 French sheath was placed in the right femoral vein. A Swan-Ganz catheter was used for the right heart catheterization. Standard protocol was followed for recording of right heart pressures and sampling of oxygen saturations. Fick cardiac output was calculated. Standard Judkins catheters were used for selective coronary angiography and left ventriculography. There were no immediate procedural complications. The patient was transferred to the post catheterization recovery area for further monitoring.  Procedural Findings: Hemodynamics RA 20/14 mean 13 mm Hg RV 50/13 mm Hg PA 45/18 mean 31 mm Hg PCWP 23/22 mean 18 mm Hg LV 154/18 mm Hg AO 152/75 mean 105 mm Hg.  No MV or AV gradient.  Oxygen saturations: PA 67% AO 91%  Cardiac Output (Fick) 6.2 L/min  Cardiac Index (Fick) 3.2 L/min/meter squared.   Coronary angiography: Coronary dominance: right  Left mainstem: Normal.   Left anterior descending (LAD): Heavily calcified proximally with 20-30% disease.  Ramus intermediate is a moderate sized vessel and is normal.  Left circumflex (LCx): Gives rise to single OM. 30% prior to OM.  Right coronary artery (RCA): Dominant vessel. Heavy calcium at the ostium. 20% disease distally.  Left ventriculography: Left ventricular systolic function is normal, LVEF is estimated at 50-55%, there is no significant mitral regurgitation   Final Conclusions:   1. Nonobstructive CAD 2. Normal LV function. 3.  Mild pulmonary HTN.  Recommendations: Continue medical therapy. Focus on weight loss. Consider evaluation for sleep apnea.   Collier [facility]ina [facility]  08/15/2012, 10:18 AM

## 2012-08-16 ENCOUNTER — Ambulatory Visit: Payer: Medicare HMO | Admitting: Cardiology

## 2012-08-31 ENCOUNTER — Ambulatory Visit (INDEPENDENT_AMBULATORY_CARE_PROVIDER_SITE_OTHER): Payer: Medicare HMO | Admitting: Nurse Practitioner

## 2012-08-31 ENCOUNTER — Encounter: Payer: Self-pay | Admitting: Nurse Practitioner

## 2012-08-31 VITALS — BP 140/86 | HR 68 | Ht 62.5 in | Wt 202.0 lb

## 2012-08-31 DIAGNOSIS — Z9889 Other specified postprocedural states: Secondary | ICD-10-CM

## 2012-08-31 LAB — BASIC METABOLIC PANEL
BUN: 13 mg/dL (ref 6–23)
CO2: 26 mEq/L (ref 19–32)
Calcium: 9.9 mg/dL (ref 8.4–10.5)
Chloride: 97 mEq/L (ref 96–112)
Creatinine, Ser: 1 mg/dL (ref 0.4–1.2)
GFR: 59.64 mL/min — ABNORMAL LOW (ref >60.00)
Glucose, Bld: 186 mg/dL — ABNORMAL HIGH (ref 70–99)
Potassium: 3.7 mEq/L (ref 3.5–5.1)
Sodium: 137 mEq/L (ref 135–145)

## 2012-08-31 NOTE — Patient Instructions (Addendum)
I will send a note to Dr. Melina Copa   We have recommended a sleep study -see if Dr. Melina Copa will order - if not, call us back and we will arrange  Try to work on your weight  We will check lab today  We will see you back as needed  Call the [place] office at [phone] if you have any questions, problems or concerns.
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
## 2012-09-07 ENCOUNTER — Telehealth: Payer: Self-pay | Admitting: Cardiology

## 2012-09-07 DIAGNOSIS — R0602 Shortness of breath: Secondary | ICD-10-CM

## 2012-09-07 NOTE — Telephone Encounter (Signed)
Returned call to patient's daughter [name] she stated she wanted Dr.Jordan to schedule her mother sleep study.Stated her mother is sob and was told PCP would schedule but she wanted Dr.Jordan to schedule.Schedulers will schedule and call back tomorrow 09/08/12 with appointment.

## 2012-09-07 NOTE — Telephone Encounter (Signed)
New prob  Daughter states pt is not feeling well today and has had a headache for the past 24 hours. She would like to speak with you.

## 2012-09-22 ENCOUNTER — Ambulatory Visit (HOSPITAL_BASED_OUTPATIENT_CLINIC_OR_DEPARTMENT_OTHER): Payer: Medicare HMO | Attending: Cardiology

## 2012-09-22 VITALS — Ht 62.0 in | Wt 202.0 lb

## 2012-09-22 DIAGNOSIS — G4733 Obstructive sleep apnea (adult) (pediatric): Secondary | ICD-10-CM | POA: Insufficient documentation

## 2012-09-22 DIAGNOSIS — I499 Cardiac arrhythmia, unspecified: Secondary | ICD-10-CM | POA: Insufficient documentation

## 2012-09-22 DIAGNOSIS — R0602 Shortness of breath: Secondary | ICD-10-CM

## 2012-09-29 DIAGNOSIS — G4710 Hypersomnia, unspecified: Secondary | ICD-10-CM

## 2012-09-29 DIAGNOSIS — G4730 Sleep apnea, unspecified: Secondary | ICD-10-CM

## 2012-09-29 NOTE — Procedures (Signed)
NAMEROSINE, LANGI NO.:  [id number]  MEDICAL RECORD NO.:  [id number]          PATIENT TYPE:  OUT  LOCATION:  SLEEP CENTER                 FACILITY:  [facility]  PHYSICIAN:  Kathee Delton, MD,FCCPDATE OF BIRTH:  11[birth year]02[birth year]1942  DATE OF STUDY:  09/22/2012                           NOCTURNAL POLYSOMNOGRAM  REFERRING PHYSICIAN:  Peter M. [name], M.D.  INDICATION FOR STUDY:  Hypersomnia with sleep apnea.  EPWORTH SLEEPINESS SCORE:  4.  MEDICATIONS:  SLEEP ARCHITECTURE:  The patient had a total sleep time of 203 minutes with no slow-wave sleep and only 33 minutes of REM.  Sleep onset latency was prolonged at 60 minutes, and REM onset was prolonged at 302 minutes. Sleep efficiency was poor at 51%.  RESPIRATORY DATA:  The patient was found to have 3 apneas and 18 obstructive hypopneas, giving her an apnea-hypopnea index of 6 events per hour.  It should be noted that her events occurred almost entirely during REM after 4:00 a.m.  There was loud snoring noted.  OXYGEN DATA:  There was O2 desaturation as low as 75% during her obstructive events.  CARDIAC DATA:  A regular rhythm noted at times during the study, appearing to be sinus arrhythmia versus atrial fibrillation.  It was very difficult from the tracings to denote a P-wave.  MOVEMENT-PARASOMNIA:  The patient had moderate numbers of leg jerks with very little sleep disruption.  There were no abnormal behaviors noted.  IMPRESSIONS-RECOMMENDATIONS: 1. Minimal obstructive sleep apnea/hypopnea syndrome, with an AHI of 6     events per hour and oxygen desaturation as low as 75%.  However,     almost all of her events occurred during her one and only REM at     the very end of the study, and therefore her degree of sleep apnea     may be underestimated.  Clinical correlation is suggested.  If the     decision is made to treat her sleep apnea aggressively,     consideration can be given to a dental appliance as well as  CPAP     coupled with weight loss. 2. Episodes of irregular rhythm during the night, which appeared to be     sinus arrhythmia versus atrial fibrillation.  It was very difficult     to discern a P-wave from the computer tracings.     Kathee Delton, MD,FCCP Diplomate, [place] Board of Sleep Medicine    [facility]/MEDQ  D:  09/29/2012 08:55:04  T:  09/29/2012 21:30:47  Job:  [id number]

## 2012-09-30 ENCOUNTER — Encounter (HOSPITAL_BASED_OUTPATIENT_CLINIC_OR_DEPARTMENT_OTHER): Payer: Medicare HMO

## 2012-10-04 ENCOUNTER — Telehealth: Payer: Self-pay | Admitting: Cardiology

## 2012-10-04 DIAGNOSIS — R0602 Shortness of breath: Secondary | ICD-10-CM

## 2012-10-04 NOTE — Telephone Encounter (Signed)
Returned call to patient's daughter [name] she stated mother had sleep study last week and was calling to get results.Also mother gets very sob when she walks long distances.Advised Dr.Jordan out of office this week will call her back 10/10/12 when Dr.Jordan is back in office.

## 2012-10-04 NOTE — Telephone Encounter (Signed)
New Problem  Pt recently had a sleep test/ has not received results// DTR states that pt has now started walking and is experiencing SOB// only when she walks for long periods of time.

## 2012-10-06 NOTE — Telephone Encounter (Signed)
Returning call. Please call daughter [name] @ [phone].

## 2012-10-06 NOTE — Telephone Encounter (Signed)
Returned call to patient's daughter [name] she stated mother told her last night she had noticed a stinging sensation in lower abdomen and had noticed 2 to 3 small bruises on lower abdomen.Also complaining of sob.Stated she can't walk to mail box without being sob.Advised Dr.Jordan out of office will let him know 10/10/12.Advised she will bruise easy since she is taking xarelto.Monitor bruising if bruising gets worse call back. Will show Dr.Jordan recent sleep study and call her back 10/10/12.

## 2012-10-07 ENCOUNTER — Encounter (HOSPITAL_BASED_OUTPATIENT_CLINIC_OR_DEPARTMENT_OTHER): Payer: Medicare HMO

## 2012-10-11 NOTE — Telephone Encounter (Signed)
Follow up ° °Pt's daughter returning call °

## 2012-10-11 NOTE — Telephone Encounter (Signed)
Returned call to patient's daughter [name] spoke to [place] sleep study revealed no significant sleep apnea.Advised weight loss will help.He advised ok to refer to pulmonology for sob.Schedulers will back with appointment.

## 2012-10-14 ENCOUNTER — Other Ambulatory Visit (HOSPITAL_COMMUNITY): Payer: Self-pay | Admitting: *Deleted

## 2012-10-14 DIAGNOSIS — Z1231 Encounter for screening mammogram for malignant neoplasm of breast: Secondary | ICD-10-CM

## 2012-10-18 ENCOUNTER — Ambulatory Visit (HOSPITAL_COMMUNITY)
Admission: RE | Admit: 2012-10-18 | Discharge: 2012-10-18 | Disposition: A | Discharge status: Home / Self Care | Payer: Medicare HMO | Source: Ambulatory Visit | Attending: *Deleted | Admitting: *Deleted

## 2012-10-18 DIAGNOSIS — Z1231 Encounter for screening mammogram for malignant neoplasm of breast: Secondary | ICD-10-CM | POA: Insufficient documentation

## 2012-11-11 ENCOUNTER — Ambulatory Visit (INDEPENDENT_AMBULATORY_CARE_PROVIDER_SITE_OTHER): Payer: Medicare HMO | Admitting: Pulmonary Disease

## 2012-11-11 ENCOUNTER — Encounter: Payer: Self-pay | Admitting: Pulmonary Disease

## 2012-11-11 VITALS — BP 128/70 | HR 125 | Temp 98.3°F | Ht 62.0 in | Wt 204.2 lb

## 2012-11-11 DIAGNOSIS — R0609 Other forms of dyspnea: Secondary | ICD-10-CM

## 2012-11-11 NOTE — Patient Instructions (Signed)
You do not need another chest xray.  You had one in July that looked ok. Will schedule for breathing studies, and will see you back the same day to review.  Will try to get within 2 weeks.  Work on [employer] loss and conditioning program.

## 2012-11-11 NOTE — Progress Notes (Signed)
  Subjective:    Patient ID: [name], female    DOB: 1940-07-16, 72 y.o.   MRN: [id number]  HPI The patient is a 72 year old female who been asked to see for dyspnea on exertion.  She has a history of chronic shortness of breath, but the last one year has had progressive symptoms.  She was also found to have atypical chest discomfort earlier in the year, and ultimately found to have had new atrial fibrillation.  Despite being on medication for control, as well as anti-coagulation, the patient has continued to have shortness of breath.  She describes dyspnea at one half lock on flat ground at a moderate pace, and also doing light housework.  She will get very winded walking up a flight of stairs, or bringing groceries in from the car.  She has had very little cough or mucus production, and has never smoked.  She was told that she had breathing issues during her teenage years, and that she may have asthma.  She has seen Dr. Annamaria Boots in the distant past for allergies, and had a reaction to allergy vaccine.  The patient states that her weight is up and down over the last 1-2 years.  She also has chronic lower extremity edema.  She has had an echo in the past that showed diffuse hypokinesis and an ejection fraction of 45-50%.  Has also had diastolic dysfunction.  She had mildly dilated left atrium but a normal right ventricle, and nothing to suggest pulmonary hypertension.  She has had a recent cardiac catheter that showed nonobstructive coronary disease and a normal left ventricular function.  Her right-sided heart catheter showed mildly elevated pulmonary pressures with mildly elevated left-sided pressures and a normal pulmonary vascular resistance.   Review of Systems  Constitutional: Positive for appetite change and unexpected weight change. Negative for fever.  HENT: Positive for ear pain, sneezing and trouble swallowing. Negative for congestion, dental problem, nosebleeds, postnasal drip, rhinorrhea,  sinus pressure and sore throat.   Eyes: Negative for redness and itching.  Respiratory: Positive for cough and shortness of breath. Negative for chest tightness and wheezing.   Cardiovascular: Positive for chest pain, palpitations ( irregular heartbeat) and leg swelling ( feet/leg).  Gastrointestinal: Positive for abdominal pain. Negative for nausea and vomiting.       Acid heartburn//indigestion  Genitourinary: Negative for dysuria.  Musculoskeletal: Positive for arthralgias and joint swelling.  Skin: Positive for rash.  Neurological: Positive for headaches.  Hematological: Does not bruise/bleed easily.  Psychiatric/Behavioral: Positive for dysphoric mood. The patient is nervous/anxious.        Objective:   Physical Exam Constitutional:  Morbidly obese female, no acute distress  HENT:  Nares patent without discharge  Oropharynx without exudate, palate and uvula are normal  Eyes:  Perrla, eomi, no scleral icterus  Neck:  No JVD, no TMG  Cardiovascular:  irreg rhythm with apical rate 114, no rubs or gallops.  No murmurs        Intact distal pulses but diminished.  Pulmonary :  Normal breath sounds, no stridor or respiratory distress   No rales, rhonchi, or wheezing  Abdominal:  Soft, nondistended, bowel sounds present.  No tenderness noted.   Musculoskeletal:  + lower extremity edema noted.  Lymph Nodes:  No cervical lymphadenopathy noted  Skin:  No cyanosis noted  Neurologic:  Alert, appropriate, moves all 4 extremities without obvious deficit.         Assessment & Plan:

## 2012-11-11 NOTE — Assessment & Plan Note (Signed)
The patient has chronic dyspnea on exertion that has worsened over the last one year.  She has had a recent right and left heart catheterization which does show elevated left-sided pressures, and no pulmonary arterial hypertension.  Her pulmonary vascular resistance was low.  I suspect this is secondary to diastolic dysfunction.  She has a history of asthma as a child, but has never smoked.  I would like to do pulmonary function studies to see if she has airflow obstruction.  She has no history of thromboembolic disease, and has been on anticoagulation since January of this year for her atrial fibrillation.  I see no reason to pursue a ventilation perfusion scan.  She also has atrial fibrillation with an elevated rate at rest today in the office, and I wonder if this is more of an issue with exertion.  Finally, she is morbidly obese and clearly deconditioned.  We'll check pulmonary function studies to see if she has underlying obstructive lung disease, but if not, I am not sure if there is any thing to pursue from a pulmonary standpoint.

## 2012-12-02 ENCOUNTER — Ambulatory Visit: Payer: Medicare HMO | Admitting: Pulmonary Disease

## 2012-12-30 ENCOUNTER — Ambulatory Visit (INDEPENDENT_AMBULATORY_CARE_PROVIDER_SITE_OTHER): Payer: Medicare HMO | Admitting: Pulmonary Disease

## 2012-12-30 ENCOUNTER — Encounter: Payer: Self-pay | Admitting: Pulmonary Disease

## 2012-12-30 VITALS — BP 132/80 | HR 66 | Temp 98.4°F | Ht 61.5 in | Wt 195.5 lb

## 2012-12-30 DIAGNOSIS — R0609 Other forms of dyspnea: Secondary | ICD-10-CM

## 2012-12-30 LAB — PULMONARY FUNCTION TEST

## 2012-12-30 NOTE — Assessment & Plan Note (Signed)
The patient has dyspnea on exertion that I suspect is multifactorial. She has known underlying cardiac disease, morbid obesity, and is clearly deconditioned. Her pulmonary function studies showed no significant airflow obstruction, and a normal diffusion capacity. She does have mild restriction that I think is related to her centripetal obesity. I have recommended to her that she work on aggressive weight loss and some type of exercise program. I really see nothing to address from a pulmonary standpoint.

## 2012-12-30 NOTE — Patient Instructions (Signed)
Your breathing studies are normal except for a smaller lung capacity related to your weight.  Work on weight loss as well as an exercise program. followup with me as needed.

## 2012-12-30 NOTE — Progress Notes (Signed)
   Subjective:    Patient ID: [name], female    DOB: [birth year], 72 y.o.   MRN: [id number]  HPI Patient comes in today for followup of her pulmonary function studies, done as part of a workup for dyspnea on exertion. She was found that no air flow obstruction or significant bronchodilator response, a normal diffusion capacity, and only mild restriction. I have reviewed the study with her in detail, and answered all of her questions.   Review of Systems  Constitutional: Negative for fever and unexpected weight change.  HENT: Positive for postnasal drip, rhinorrhea, sinus pressure, sneezing, sore throat and trouble swallowing. Negative for congestion, dental problem, ear pain and nosebleeds.   Eyes: Positive for redness and itching.  Respiratory: Positive for cough, chest tightness and shortness of breath. Negative for wheezing.   Cardiovascular: Negative for palpitations and leg swelling.  Gastrointestinal: Negative for nausea and vomiting.  Genitourinary: Negative for dysuria.  Musculoskeletal: Negative for joint swelling.  Skin: Negative for rash.  Neurological: Positive for headaches.  Hematological: Does not bruise/bleed easily.  Psychiatric/Behavioral: Negative for dysphoric mood. The patient is not nervous/anxious.        Objective:   Physical Exam Obese female in no acute distress Nose without purulence or discharge noted Neck without lymphadenopathy or thyromegaly Lower extremities with edema, no cyanosis Alert and oriented, moves all 4 extremities.       Assessment & Plan:

## 2012-12-30 NOTE — Progress Notes (Signed)
PFT done today. 

## 2013-01-08 ENCOUNTER — Inpatient Hospital Stay (HOSPITAL_COMMUNITY)
Admission: EM | Admit: 2013-01-08 | Discharge: 2013-01-11 | DRG: 262 | Disposition: A | Discharge status: Home / Self Care | Payer: Medicare HMO | Attending: Cardiology | Admitting: Cardiology

## 2013-01-08 ENCOUNTER — Emergency Department (HOSPITAL_COMMUNITY): Payer: Medicare HMO

## 2013-01-08 ENCOUNTER — Encounter (HOSPITAL_COMMUNITY): Payer: Self-pay | Admitting: Emergency Medicine

## 2013-01-08 ENCOUNTER — Other Ambulatory Visit: Payer: Self-pay | Admitting: Internal Medicine

## 2013-01-08 DIAGNOSIS — I4891 Unspecified atrial fibrillation: Secondary | ICD-10-CM | POA: Diagnosis present

## 2013-01-08 DIAGNOSIS — E669 Obesity, unspecified: Secondary | ICD-10-CM | POA: Diagnosis present

## 2013-01-08 DIAGNOSIS — E876 Hypokalemia: Secondary | ICD-10-CM | POA: Diagnosis present

## 2013-01-08 DIAGNOSIS — I480 Paroxysmal atrial fibrillation: Secondary | ICD-10-CM

## 2013-01-08 DIAGNOSIS — R55 Syncope and collapse: Secondary | ICD-10-CM

## 2013-01-08 DIAGNOSIS — E1169 Type 2 diabetes mellitus with other specified complication: Secondary | ICD-10-CM

## 2013-01-08 DIAGNOSIS — Z9181 History of falling: Secondary | ICD-10-CM

## 2013-01-08 DIAGNOSIS — E1140 Type 2 diabetes mellitus with diabetic neuropathy, unspecified: Secondary | ICD-10-CM

## 2013-01-08 DIAGNOSIS — I2510 Atherosclerotic heart disease of native coronary artery without angina pectoris: Principal | ICD-10-CM | POA: Diagnosis present

## 2013-01-08 DIAGNOSIS — E785 Hyperlipidemia, unspecified: Secondary | ICD-10-CM | POA: Diagnosis present

## 2013-01-08 DIAGNOSIS — E1122 Type 2 diabetes mellitus with diabetic chronic kidney disease: Secondary | ICD-10-CM | POA: Diagnosis present

## 2013-01-08 DIAGNOSIS — I214 Non-ST elevation (NSTEMI) myocardial infarction: Secondary | ICD-10-CM

## 2013-01-08 DIAGNOSIS — K219 Gastro-esophageal reflux disease without esophagitis: Secondary | ICD-10-CM | POA: Diagnosis present

## 2013-01-08 DIAGNOSIS — R778 Other specified abnormalities of plasma proteins: Secondary | ICD-10-CM

## 2013-01-08 DIAGNOSIS — I2789 Other specified pulmonary heart diseases: Secondary | ICD-10-CM | POA: Diagnosis present

## 2013-01-08 DIAGNOSIS — E119 Type 2 diabetes mellitus without complications: Secondary | ICD-10-CM | POA: Diagnosis present

## 2013-01-08 DIAGNOSIS — I10 Essential (primary) hypertension: Secondary | ICD-10-CM | POA: Diagnosis present

## 2013-01-08 DIAGNOSIS — E039 Hypothyroidism, unspecified: Secondary | ICD-10-CM | POA: Diagnosis present

## 2013-01-08 HISTORY — DX: Non-ST elevation (NSTEMI) myocardial infarction: I21.4

## 2013-01-08 HISTORY — DX: Diverticulitis of intestine, part unspecified, without perforation or abscess without bleeding: K57.92

## 2013-01-08 HISTORY — DX: Syncope and collapse: R55

## 2013-01-08 HISTORY — DX: Type 2 diabetes mellitus without complications: E11.9

## 2013-01-08 HISTORY — DX: Atherosclerotic heart disease of native coronary artery without angina pectoris: I25.10

## 2013-01-08 HISTORY — DX: Paroxysmal atrial fibrillation: I48.0

## 2013-01-08 LAB — BASIC METABOLIC PANEL
BUN: 18 mg/dL (ref 6–23)
CO2: 27 mEq/L (ref 19–32)
Calcium: 10 mg/dL (ref 8.4–10.5)
Chloride: 93 mEq/L — ABNORMAL LOW (ref 96–112)
Creatinine, Ser: 0.79 mg/dL (ref 0.50–1.10)
GFR calc Af Amer: >90 mL/min (ref >90)
GFR calc non Af Amer: 82 mL/min — ABNORMAL LOW (ref >90)
Glucose, Bld: 189 mg/dL — ABNORMAL HIGH (ref 70–99)
Potassium: 3.2 mEq/L — ABNORMAL LOW (ref 3.5–5.1)
Sodium: 136 mEq/L (ref 135–145)

## 2013-01-08 LAB — POCT I-STAT TROPONIN I: Troponin i, poc: 0.8 ng/mL (ref 0.00–0.08)

## 2013-01-08 LAB — CBC
HCT: 38.5 % (ref 36.0–46.0)
Hemoglobin: 12.6 g/dL (ref 12.0–15.0)
MCH: 28.2 pg (ref 26.0–34.0)
MCHC: 32.7 g/dL (ref 30.0–36.0)
MCV: 86.1 fL (ref 78.0–100.0)
Platelets: 283 10*3/uL (ref 150–400)
RBC: 4.47 MIL/uL (ref 3.87–5.11)
RDW: 15 % (ref 11.5–15.5)
WBC: 11.5 10*3/uL — ABNORMAL HIGH (ref 4.0–10.5)

## 2013-01-08 LAB — TROPONIN I
Troponin I: 1.35 ng/mL (ref <0.30)
Troponin I: <0.3 ng/mL (ref <0.30)

## 2013-01-08 LAB — PROTIME-INR
INR: 1.6 — ABNORMAL HIGH (ref 0.00–1.49)
Prothrombin Time: 18.6 seconds — ABNORMAL HIGH (ref 11.6–15.2)

## 2013-01-08 LAB — APTT: aPTT: 44 seconds — ABNORMAL HIGH (ref 24–37)

## 2013-01-08 LAB — PRO B NATRIURETIC PEPTIDE: Pro B Natriuretic peptide (BNP): 373.8 pg/mL — ABNORMAL HIGH (ref 0–125)

## 2013-01-08 LAB — GLUCOSE, CAPILLARY: Glucose-Capillary: 113 mg/dL — ABNORMAL HIGH (ref 70–99)

## 2013-01-08 MED ORDER — POTASSIUM CHLORIDE ER 10 MEQ PO TBCR
40.0000 meq | EXTENDED_RELEASE_TABLET | Freq: Once | ORAL | Status: DC
  Filled 2013-01-08: qty 4

## 2013-01-08 MED ORDER — ESTRADIOL 1 MG PO TABS
1.0000 mg | ORAL_TABLET | Freq: Every day | ORAL | Status: DC
  Administered 2013-01-09 – 2013-01-11 (×3): 1 mg via ORAL
  Filled 2013-01-08 (×3): qty 1

## 2013-01-08 MED ORDER — ONDANSETRON HCL 4 MG/2ML IJ SOLN
4.0000 mg | Freq: Four times a day (QID) | INTRAMUSCULAR | Status: DC | PRN
  Administered 2013-01-09: 4 mg via INTRAVENOUS
  Filled 2013-01-08: qty 2

## 2013-01-08 MED ORDER — HYDROCHLOROTHIAZIDE 12.5 MG PO CAPS
12.5000 mg | ORAL_CAPSULE | Freq: Every day | ORAL | Status: DC
  Administered 2013-01-09 – 2013-01-11 (×3): 12.5 mg via ORAL
  Filled 2013-01-08 (×3): qty 1

## 2013-01-08 MED ORDER — POTASSIUM CHLORIDE CRYS ER 20 MEQ PO TBCR
40.0000 meq | EXTENDED_RELEASE_TABLET | Freq: Once | ORAL | Status: AC
  Administered 2013-01-08: 40 meq via ORAL
  Filled 2013-01-08: qty 2

## 2013-01-08 MED ORDER — ACETAMINOPHEN 500 MG PO TABS
500.0000 mg | ORAL_TABLET | Freq: Every day | ORAL | Status: DC | PRN
  Administered 2013-01-10 – 2013-01-11 (×2): 500 mg via ORAL
  Filled 2013-01-08 (×2): qty 1

## 2013-01-08 MED ORDER — ASPIRIN 81 MG PO CHEW
324.0000 mg | CHEWABLE_TABLET | Freq: Once | ORAL | Status: AC
  Administered 2013-01-08: 324 mg via ORAL
  Filled 2013-01-08: qty 4

## 2013-01-08 MED ORDER — INSULIN ASPART 100 UNIT/ML ~~LOC~~ SOLN
0.0000 [IU] | Freq: Three times a day (TID) | SUBCUTANEOUS | Status: DC
  Administered 2013-01-09: 1 [IU] via SUBCUTANEOUS
  Administered 2013-01-09: 5 [IU] via SUBCUTANEOUS
  Administered 2013-01-10 (×2): 2 [IU] via SUBCUTANEOUS
  Administered 2013-01-10 – 2013-01-11 (×2): 3 [IU] via SUBCUTANEOUS
  Administered 2013-01-11: 5 [IU] via SUBCUTANEOUS

## 2013-01-08 MED ORDER — LEVOTHYROXINE SODIUM 137 MCG PO TABS
137.0000 ug | ORAL_TABLET | Freq: Every day | ORAL | Status: DC
  Administered 2013-01-09 – 2013-01-11 (×3): 137 ug via ORAL
  Filled 2013-01-08 (×4): qty 1

## 2013-01-08 MED ORDER — TRAMADOL HCL 50 MG PO TABS
100.0000 mg | ORAL_TABLET | Freq: Four times a day (QID) | ORAL | Status: DC | PRN
  Administered 2013-01-08: 50 mg via ORAL
  Filled 2013-01-08: qty 2

## 2013-01-08 MED ORDER — INSULIN ASPART 100 UNIT/ML ~~LOC~~ SOLN: 0.0000 [IU] | Freq: Every day | SUBCUTANEOUS | Status: DC

## 2013-01-08 MED ORDER — PANTOPRAZOLE SODIUM 20 MG PO TBEC
20.0000 mg | DELAYED_RELEASE_TABLET | Freq: Every day | ORAL | Status: DC
  Administered 2013-01-09 – 2013-01-11 (×3): 20 mg via ORAL
  Filled 2013-01-08 (×3): qty 1

## 2013-01-08 MED ORDER — ASPIRIN 81 MG PO CHEW
324.0000 mg | CHEWABLE_TABLET | ORAL | Status: AC
  Administered 2013-01-08: 324 mg via ORAL
  Filled 2013-01-08: qty 4

## 2013-01-08 MED ORDER — ACETAMINOPHEN 325 MG PO TABS: 650.0000 mg | ORAL_TABLET | ORAL | Status: DC | PRN

## 2013-01-08 MED ORDER — CLONAZEPAM 0.5 MG PO TABS: 0.5000 mg | ORAL_TABLET | Freq: Two times a day (BID) | ORAL | Status: DC | PRN

## 2013-01-08 MED ORDER — PROMETHAZINE HCL 25 MG PO TABS: 25.0000 mg | ORAL_TABLET | Freq: Four times a day (QID) | ORAL | Status: DC | PRN

## 2013-01-08 MED ORDER — VITAMIN D3 25 MCG (1000 UNIT) PO TABS
5000.0000 [IU] | ORAL_TABLET | Freq: Every day | ORAL | Status: DC
  Administered 2013-01-09 – 2013-01-11 (×3): 5000 [IU] via ORAL
  Filled 2013-01-08 (×3): qty 5

## 2013-01-08 MED ORDER — ASPIRIN 300 MG RE SUPP
300.0000 mg | RECTAL | Status: AC
  Filled 2013-01-08: qty 1

## 2013-01-08 MED ORDER — METOPROLOL TARTRATE 25 MG PO TABS
25.0000 mg | ORAL_TABLET | Freq: Two times a day (BID) | ORAL | Status: DC
  Administered 2013-01-08 – 2013-01-11 (×5): 25 mg via ORAL
  Filled 2013-01-08 (×7): qty 1

## 2013-01-08 MED ORDER — ASPIRIN EC 81 MG PO TBEC
81.0000 mg | DELAYED_RELEASE_TABLET | Freq: Every day | ORAL | Status: DC
  Administered 2013-01-09 – 2013-01-11 (×3): 81 mg via ORAL
  Filled 2013-01-08 (×3): qty 1

## 2013-01-08 MED ORDER — HEPARIN (PORCINE) IN NACL 100-0.45 UNIT/ML-% IJ SOLN
1200.0000 [IU]/h | INTRAMUSCULAR | Status: DC
  Administered 2013-01-08 – 2013-01-09 (×2): 1200 [IU]/h via INTRAVENOUS
  Filled 2013-01-08 (×3): qty 250

## 2013-01-08 MED ORDER — SIMVASTATIN 40 MG PO TABS
40.0000 mg | ORAL_TABLET | Freq: Every evening | ORAL | Status: DC
  Administered 2013-01-08 – 2013-01-10 (×2): 40 mg via ORAL
  Filled 2013-01-08 (×4): qty 1

## 2013-01-08 NOTE — ED Notes (Signed)
EMS arrived to transport pt to [facility]

## 2013-01-08 NOTE — H&P (Signed)
History and Physical  Patient ID: [name] MRN: [id number], DOB: 09-17-1940 Date of Encounter: 01/08/2013, 8:50 PM Primary Physician: No primary provider on file. Primary Cardiologist:   Chief Complaint: general body aches  Reason for Admission: Elevated troponin    66yr WF with hx of paroxysmal Afib, DM type II, HTN chronic symptoms of SOB, generalized body aches presents with " feeling sick all over " and admitted for elevated troponin   HPI: pt states that last week she had a fall , possibly mechanical while doing yard work 1 wk ago and since then her symptoms of generalized body aches ( bilateral arm , chest , back and abd ) have become worse. She notes on Friday she began to have nausea , emesis , runny nose, cough which was dry  which she decided to come to the hospital .  She also states that since January this year she has been experiencing  gradual worsening of her chronic symptoms of SOB ( " cant get enough air at rest " ) and is bothered by difficulty with balance while walking due to her neuropathy. She denies any orthopnea, PND , lE edema , ( head spins when she lies flat ) . States that chest pain and neck pain is diffuse and constant and worse with deep breath . No change with position.  Reports multiple drug allergies .  Never smoked  Expresses frustration and not being able to figure out cause of her chronic SOB despite workup by her cardiologist and pulomonolgist ( workup  not available for review at this time ) .    Past Medical History  Diagnosis Date  . Atrial fibrillation   . Diabetes mellitus without complication   . Neuropathy      Most Recent Cardiac Studies: EKG 01/08/2013 NSR , [facility]    Surgical History:  Past Surgical History  Procedure Laterality Date  . Cholecystectomy       Home Meds: Prior to Admission medications   Not on File    Allergies:  Allergies  Allergen Reactions  . Nitroglycerin     History   Social History  . Marital  Status: Widowed    Spouse Name: N/A    Number of Children: N/A  . Years of Education: N/A   Occupational History  . Not on file.   Social History Main Topics  . Smoking status: Not on file  . Smokeless tobacco: Not on file  . Alcohol Use: Not on file  . Drug Use: Not on file  . Sexual Activity: Not on file   Other Topics Concern  . Not on file   Social History Narrative  . No narrative on file     No family history on file.  Review of Systems:  Labs:   Lab Results  Component Value Date   WBC 11.5* 01/08/2013   HGB 12.6 01/08/2013   HCT 38.5 01/08/2013   MCV 86.1 01/08/2013   PLT 283 01/08/2013    Recent Labs Lab 01/08/13 1405  NA 136  K 3.2*  CL 93*  CO2 27  BUN 18  CREATININE 0.79  CALCIUM 10.0  GLUCOSE 189*    Recent Labs  01/08/13 1405  TROPONINI 1.35*   No results found for this basename: CHOL, HDL, LDLCALC, TRIG   No results found for this basename: DDIMER    Radiology/Studies:  Dg Chest Port 1 View  01/08/2013   CLINICAL DATA:  Shortness of breath  EXAM: PORTABLE CHEST - 1  VIEW  COMPARISON:  None.  FINDINGS: The heart size and mediastinal contours are within normal limits. Both lungs are clear. The visualized skeletal structures are unremarkable.  IMPRESSION: No active disease.   Electronically Signed   By: Inez Catalina M.D.   On: 01/08/2013 14:37     EKG: as above   Physical Exam: Blood pressure 107/58, pulse 60, temperature 98.3 F (36.8 C), temperature source Oral, resp. rate 18, height 5\' 1"  (1.549 m), weight 88.451 kg (195 lb), SpO2 98.00%. General: Well developed, well nourished, in no acute distress. Head: Normocephalic, atraumatic, sclera non-icteric, no xanthomas, nares are without discharge.  Neck: Negative for carotid bruits. JVD not elevated. Lungs: Clear bilaterally to auscultation without wheezes, rales, or rhonchi. Breathing is unlabored. Heart: RRR with S1 S2. No murmurs, rubs, or gallops appreciated. Abdomen: Soft,  non-tender, non-distended with normoactive bowel sounds. No hepatomegaly. No rebound/guarding. No obvious abdominal masses. Msk:  Strength and tone appear normal for age. Extremities: No clubbing or cyanosis. No edema.  Distal pedal pulses are 2+ and equal bilaterally. Neuro: Alert and oriented X 3. No focal deficit. No facial asymmetry. Moves all extremities spontaneously. Psych:  Responds to questions appropriately with a normal affect.    ASSESSMENT AND PLAN:  -Chest pain - precordial / pleuritic chest pain DDx of NSTEMI vs pericarditis. Lower prob for PE since pt already AC.  - PAF  - Hypopkalemia  - DM Type II  - Neuropathy    Plan  - monitor on tele , trend CE, continue AC with heparin and hold rivaroxaban , NPO after midnight  - check ESR and Echo 2 d in am  - cont aspirin  - replete K  - hold Byetta and keep on insulin ACHS , check Hg A1c  - PT/Ot consult for her issues with ambulation    Signed, Arnoldo Lenis, A MD 01/08/2013, 8:50 PM

## 2013-01-08 NOTE — Progress Notes (Addendum)
ANTICOAGULATION CONSULT NOTE - Initial Consult  Pharmacy Consult for Heparin  Indication: ACS/NSTEMI  Allergies  Allergen Reactions  . Nitroglycerin     Patient Measurements:     Vital Signs: Temp: 98.1 F (36.7 C) (12/14 1341) Temp src: Oral (12/14 1341) BP: 98/59 mmHg (12/14 1534) Pulse Rate: 73 (12/14 1534)  Labs:  Recent Labs  01/08/13 1405  HGB 12.6  HCT 38.5  PLT 283  LABPROT 18.6*  INR 1.60*  CREATININE 0.79  TROPONINI 1.35*    CrCl is unknown because there is no height on file for the current visit.   Medical History: Past Medical History  Diagnosis Date  . Atrial fibrillation   . Diabetes mellitus without complication   . Neuropathy     Medications:   Assessment: 72 yo M presents to ER w/ NSTEMI to start IV heparin per pharmacy.  Patient is on chronic anticoagulation with xarelto for atrial fibrillation.   Patient reports taking xarelto 20 mg daily, her last dose was at 1700, appropriate to start heparin now.  Per protocol, IV heparin is to start 24 hours after last Xarelto dose   Scr is 0.79 - CrCl 66  ml/min   PT/INR elevated at baseline 18.6/1.6, likely effect from Xarelto  H/H and plt are WNL  Baseline aPTT - will order  Patients weight 88.5 kg  Goal of Therapy:  Heparin level 0.3-0.7 units/ml Monitor platelets by anticoagulation protocol: Yes   Plan:  1.) No heparin bolus per cardiology given chronic xarelto dosing w/ dose yesterday evening.  Baseline aPTT ordered.  2.) Start Heparin 1200 units/hr 3.) Heparin level 6 hours after start of gtt (~ 0200 on 12/15) 4.) Daily CBC and heparin level  5.) monitor for s/sx bleeding   Bohden Dung, Gaye Alken PharmD Pager #: [phone] 5:09 PM 01/08/2013

## 2013-01-08 NOTE — ED Notes (Signed)
Pt fell on MOnday ever since she has been having shob, vomiting, nausea, head pain on left side, burning in chest and abd. Pt has PMH afib and takes xarelto.

## 2013-01-08 NOTE — ED Provider Notes (Signed)
CSN: [id number]     Arrival date & time 01/08/13  1331 History   First MD Initiated Contact with Patient 01/08/13 1420     Chief Complaint  Patient presents with  . Shortness of Breath  . Chest Pain  . Fall  . Vomiting   (Consider location/radiation/quality/duration/timing/severity/associated sxs/prior Treatment) HPI  72yF with multiple pain complaints. She relates these to a fall 1w ago where it sounds like she had a syncopal event while outside doing light yard work. Pt is not sure of how or why she fell, but has had neck pain, chest pain, shoulder pain since then. She is coming in to the ED today though because she had to leave church because with the same pain and also nausea and vomiting. She has a hx of  DM, HTN, and PAF on xarelto. GI evaluation with evidence of esophageal spasm . Despite anti-reflux meds she continued o have symptoms of chest discomfort and dyspnea with exertion and had a cardiac catheterization July of this year. It showed nonobstructive CAD, normal LV function and mild pulmonary HTN.   She does not give a consistent description of her symptoms. She will describe chest and abdominal burning and "pins and needles" and talk about fibromyalgia. Additionally "it feels like an 18 wheeler sitting on my chest" and that "it feels tight like it's closing in on me." Difficult for me to delineate exactly when her symptoms have changed or what specifically was different about today that made to her come to the ED.   Past Medical History  Diagnosis Date  . Atrial fibrillation   . Diabetes mellitus without complication   . Neuropathy    Past Surgical History  Procedure Laterality Date  . Cholecystectomy     No family history on file. History  Substance Use Topics  . Smoking status: Not on file  . Smokeless tobacco: Not on file  . Alcohol Use: Not on file   OB History   Grav Para Term Preterm Abortions TAB SAB Ect Mult Living                 Review of  Systems  All systems reviewed and negative, other than as noted in HPI.   Allergies  Nitroglycerin  Home Medications  No current outpatient prescriptions on file. BP 130/69  Pulse 84  Temp(Src) 98.1 F (36.7 C) (Oral)  Resp 18  SpO2 97% Physical Exam  Nursing note and vitals reviewed. Constitutional: She appears well-developed and well-nourished. No distress.  Laying in bed. NAD. Obese.   HENT:  Head: Normocephalic and atraumatic.  Eyes: Conjunctivae are normal. Right eye exhibits no discharge. Left eye exhibits no discharge.  Neck: Neck supple.  Cardiovascular: Normal rate, regular rhythm and normal heart sounds.  Exam reveals no gallop and no friction rub.   No murmur heard. Pulmonary/Chest: Effort normal and breath sounds normal. No respiratory distress. She exhibits no tenderness.  Abdominal: Soft. She exhibits no distension. There is no tenderness.  Musculoskeletal: She exhibits no edema and no tenderness.  Lower extremities symmetric as compared to each other. No calf tenderness. Negative Homan's. No palpable cords.   Neurological: She is alert.  Skin: Skin is warm and dry.  Psychiatric: She has a normal mood and affect. Her behavior is normal. Thought content normal.    ED Course  Procedures (including critical care time) Labs Review Labs Reviewed  BASIC METABOLIC PANEL - Abnormal; Notable for the following:    Potassium 3.2 (*)  Chloride 93 (*)    Glucose, Bld 189 (*)    GFR calc non Af Amer 82 (*)    All other components within normal limits  CBC - Abnormal; Notable for the following:    WBC 11.5 (*)    All other components within normal limits  PRO B NATRIURETIC PEPTIDE - Abnormal; Notable for the following:    Pro B Natriuretic peptide (BNP) 373.8 (*)    All other components within normal limits  PROTIME-INR - Abnormal; Notable for the following:    Prothrombin Time 18.6 (*)    INR 1.60 (*)    All other components within normal limits  POCT I-STAT  TROPONIN I - Abnormal; Notable for the following:    Troponin i, poc 0.80 (*)    All other components within normal limits   Imaging Review Dg Chest Port 1 View  01/08/2013   CLINICAL DATA:  Shortness of breath  EXAM: PORTABLE CHEST - 1 VIEW  COMPARISON:  None.  FINDINGS: The heart size and mediastinal contours are within normal limits. Both lungs are clear. The visualized skeletal structures are unremarkable.  IMPRESSION: No active disease.   Electronically Signed   By: Inez Catalina M.D.   On: 01/08/2013 14:37    EKG Interpretation    Date/Time:  Sunday January 08 2013 16:29:10 EST Ventricular Rate:  70 PR Interval:  189 QRS Duration: 84 QT Interval:  430 QTC Calculation: 464 R Axis:   23 Text Interpretation:  Sinus rhythm Atrial premature complexes Low voltage, precordial leads Baseline wander in lead(s) V2 V3 V4 V5 V6 ED PHYSICIAN INTERPRETATION AVAILABLE IN [facility] Confirmed by TEST, RECORD ([id number]) on 01/10/2013 7:42:56 AM            MDM   1. NSTEMI (non-ST elevated myocardial infarction)      72 yF with CP. She interrelates previous symptoms and keeps trying to attribute to her fall one week ago. Hard to make distinction of what symptoms are acute. Regardless, troponin is elevated. On xarelto. Last took yesterday at 1700. Discussed with cardiology. Recommending heparin gtt w/o bolus and hold xarelto. Discussed with pharmacy. Transfer to [facility].    Virgel Manifold, MD 01/11/13 [phone]

## 2013-01-08 NOTE — ED Notes (Signed)
MD at bedside. 

## 2013-01-08 NOTE — ED Notes (Signed)
Critical I stat Troponin - Dr Wilson Singer and RN are both aware.

## 2013-01-09 ENCOUNTER — Encounter (HOSPITAL_COMMUNITY)
Admission: EM | Disposition: A | Discharge status: Home / Self Care | Payer: Self-pay | Source: Home / Self Care | Attending: Cardiology

## 2013-01-09 DIAGNOSIS — E876 Hypokalemia: Secondary | ICD-10-CM

## 2013-01-09 DIAGNOSIS — I4891 Unspecified atrial fibrillation: Secondary | ICD-10-CM | POA: Insufficient documentation

## 2013-01-09 DIAGNOSIS — E1140 Type 2 diabetes mellitus with diabetic neuropathy, unspecified: Secondary | ICD-10-CM

## 2013-01-09 DIAGNOSIS — I2510 Atherosclerotic heart disease of native coronary artery without angina pectoris: Secondary | ICD-10-CM

## 2013-01-09 HISTORY — PX: LEFT HEART CATHETERIZATION WITH CORONARY ANGIOGRAM: SHX5451

## 2013-01-09 HISTORY — DX: Type 2 diabetes mellitus with diabetic neuropathy, unspecified: E11.40

## 2013-01-09 LAB — BASIC METABOLIC PANEL
BUN: 14 mg/dL (ref 6–23)
CO2: 25 mEq/L (ref 19–32)
Chloride: 95 mEq/L — ABNORMAL LOW (ref 96–112)
Creatinine, Ser: 0.81 mg/dL (ref 0.50–1.10)
Glucose, Bld: 280 mg/dL — ABNORMAL HIGH (ref 70–99)
Potassium: 3.7 mEq/L (ref 3.5–5.1)

## 2013-01-09 LAB — COMPREHENSIVE METABOLIC PANEL
AST: 27 U/L (ref 0–37)
Albumin: 3.4 g/dL — ABNORMAL LOW (ref 3.5–5.2)
Alkaline Phosphatase: 51 U/L (ref 39–117)
BUN: 11 mg/dL (ref 6–23)
Chloride: 96 mEq/L (ref 96–112)
Creatinine, Ser: 0.85 mg/dL (ref 0.50–1.10)
GFR calc Af Amer: 79 mL/min — ABNORMAL LOW (ref >90)
Glucose, Bld: 170 mg/dL — ABNORMAL HIGH (ref 70–99)
Potassium: 3.7 mEq/L (ref 3.5–5.1)
Total Bilirubin: 0.3 mg/dL (ref 0.3–1.2)

## 2013-01-09 LAB — CBC
HCT: 35.6 % — ABNORMAL LOW (ref 36.0–46.0)
MCHC: 31.5 g/dL (ref 30.0–36.0)
MCV: 88.8 fL (ref 78.0–100.0)
Platelets: 228 10*3/uL (ref 150–400)
RDW: 15.3 % (ref 11.5–15.5)
WBC: 9.8 10*3/uL (ref 4.0–10.5)

## 2013-01-09 LAB — GLUCOSE, CAPILLARY: Glucose-Capillary: 148 mg/dL — ABNORMAL HIGH (ref 70–99)

## 2013-01-09 LAB — TROPONIN I
Troponin I: <0.3 ng/mL (ref <0.30)
Troponin I: <0.3 ng/mL (ref <0.30)

## 2013-01-09 LAB — POCT ACTIVATED CLOTTING TIME: Activated Clotting Time: 221 seconds

## 2013-01-09 LAB — HEMOGLOBIN A1C: Mean Plasma Glucose: 160 mg/dL — ABNORMAL HIGH (ref <117)

## 2013-01-09 LAB — HEPARIN LEVEL (UNFRACTIONATED): Heparin Unfractionated: 0.78 IU/mL — ABNORMAL HIGH (ref 0.30–0.70)

## 2013-01-09 SURGERY — LEFT HEART CATHETERIZATION WITH CORONARY ANGIOGRAM
Anesthesia: LOCAL

## 2013-01-09 MED ORDER — MIDAZOLAM HCL 2 MG/2ML IJ SOLN
INTRAMUSCULAR | Status: AC
  Filled 2013-01-09: qty 2

## 2013-01-09 MED ORDER — HEPARIN (PORCINE) IN NACL 2-0.9 UNIT/ML-% IJ SOLN
INTRAMUSCULAR | Status: AC
  Filled 2013-01-09: qty 1000

## 2013-01-09 MED ORDER — SODIUM CHLORIDE 0.9 % IV SOLN: 250.0000 mL | INTRAVENOUS | Status: DC | PRN

## 2013-01-09 MED ORDER — HEPARIN SODIUM (PORCINE) 1000 UNIT/ML IJ SOLN
INTRAMUSCULAR | Status: AC
  Filled 2013-01-09: qty 1

## 2013-01-09 MED ORDER — ADENOSINE 12 MG/4ML IV SOLN
16.0000 mL | Freq: Once | INTRAVENOUS | Status: DC
  Filled 2013-01-09: qty 16

## 2013-01-09 MED ORDER — SODIUM CHLORIDE 0.9 % IJ SOLN: 3.0000 mL | INTRAMUSCULAR | Status: DC | PRN

## 2013-01-09 MED ORDER — VERAPAMIL HCL 2.5 MG/ML IV SOLN
INTRAVENOUS | Status: AC
  Filled 2013-01-09: qty 2

## 2013-01-09 MED ORDER — SODIUM CHLORIDE 0.9 % IJ SOLN: 3.0000 mL | Freq: Two times a day (BID) | INTRAMUSCULAR | Status: DC

## 2013-01-09 MED ORDER — SODIUM CHLORIDE 0.9 % IV SOLN: 1.0000 mL/kg/h | INTRAVENOUS | Status: AC

## 2013-01-09 MED ORDER — FENTANYL CITRATE 0.05 MG/ML IJ SOLN
INTRAMUSCULAR | Status: AC
  Filled 2013-01-09: qty 2

## 2013-01-09 MED ORDER — NITROGLYCERIN 0.2 MG/ML ON CALL CATH LAB
INTRAVENOUS | Status: AC
  Filled 2013-01-09: qty 1

## 2013-01-09 MED ORDER — SODIUM CHLORIDE 0.9 % IV SOLN
INTRAVENOUS | Status: DC
  Administered 2013-01-09 – 2013-01-10 (×2): via INTRAVENOUS

## 2013-01-09 MED ORDER — LIDOCAINE HCL (PF) 1 % IJ SOLN
INTRAMUSCULAR | Status: AC
  Filled 2013-01-09: qty 30

## 2013-01-09 MED ORDER — SODIUM CHLORIDE 0.9 % IJ SOLN
3.0000 mL | Freq: Two times a day (BID) | INTRAMUSCULAR | Status: DC
  Administered 2013-01-10: 21:00:00 via INTRAVENOUS
  Administered 2013-01-10: 3 mL via INTRAVENOUS

## 2013-01-09 MED ORDER — MIDAZOLAM HCL 2 MG/2ML IJ SOLN
INTRAMUSCULAR | Status: AC
Start: 2013-01-09 — End: 2013-01-09
  Filled 2013-01-09: qty 2

## 2013-01-09 MED ORDER — ASPIRIN 81 MG PO CHEW: 81.0000 mg | CHEWABLE_TABLET | ORAL | Status: AC

## 2013-01-09 NOTE — Interval H&P Note (Signed)
History and Physical Interval Note:  01/09/2013 4:46 PM  [name]  has presented today for surgery, with the diagnosis of cp  The various methods of treatment have been discussed with the patient and family. After consideration of risks, benefits and other options for treatment, the patient has consented to  Procedure(s): LEFT HEART CATHETERIZATION WITH CORONARY ANGIOGRAM (N/A) as a surgical intervention .  The patient's history has been reviewed, patient examined, no change in status, stable for surgery.  I have reviewed the patient's chart and labs.  Questions were answered to the patient's satisfaction.    Cath Lab Visit (complete for each Cath Lab visit)  Clinical Evaluation Leading to the Procedure:   ACS: yes  Non-ACS:    Anginal Classification: CCS IV  Anti-ischemic medical therapy: Minimal Therapy (1 class of medications)  Non-Invasive Test Results: No non-invasive testing performed  Prior CABG: No previous CABG       Sherren Mocha

## 2013-01-09 NOTE — Progress Notes (Signed)
Heparin DC'd; notified MD for heparin or xeralto orders; no orders given; MD will re-evaluate in AM; clear liquid diet-advance as tolerated to carb mod; notified MD of low BP; MD placed parameters orders for BP medication.  BARNETT, Marsh Dolly

## 2013-01-09 NOTE — Progress Notes (Signed)
Subjective: Still feels bad.  HA (posterior on L), chest tightness (like elephant), N/ Objective: Filed Vitals:   01/08/13 1840 01/08/13 1955 01/08/13 2300 01/09/13 0519  BP: 125/75 107/58  91/49  Pulse: 87 60  66  Temp: 98.2 F (36.8 C) 98.3 F (36.8 C)  98.3 F (36.8 C)  TempSrc: Oral Oral  Oral  Resp: 18 18  18   Height:      Weight:      SpO2: 99% 98% 97% 91%   Weight change:   Intake/Output Summary (Last 24 hours) at 01/09/13 0741 Last data filed at 01/08/13 2200  Gross per 24 hour  Intake  279.2 ml  Output      0 ml  Net  279.2 ml    General: Alert, awake, oriented x3, in no acute distress Neck:  JVP is normal Heart: Regular rate and rhythm, without murmurs, rubs, gallops.  Lungs: Clear to auscultation.  No rales or wheezes. Exemities:  No edema.   Neuro: Grossly intact, nonfocal.  Tele:   SR  Lab Results: Results for orders placed during the hospital encounter of 01/08/13 (from the past 24 hour(s))  BASIC METABOLIC PANEL     Status: Abnormal   Collection Time    01/08/13  2:05 PM      Result Value Range   Sodium 136  135 - 145 mEq/L   Potassium 3.2 (*) 3.5 - 5.1 mEq/L   Chloride 93 (*) 96 - 112 mEq/L   CO2 27  19 - 32 mEq/L   Glucose, Bld 189 (*) 70 - 99 mg/dL   BUN 18  6 - 23 mg/dL   Creatinine, Ser 0.79  0.50 - 1.10 mg/dL   Calcium 10.0  8.4 - 10.5 mg/dL   GFR calc non Af Amer 82 (*) >90 mL/min   GFR calc Af Amer >90  >90 mL/min  CBC     Status: Abnormal   Collection Time    01/08/13  2:05 PM      Result Value Range   WBC 11.5 (*) 4.0 - 10.5 K/uL   RBC 4.47  3.87 - 5.11 MIL/uL   Hemoglobin 12.6  12.0 - 15.0 g/dL   HCT 38.5  36.0 - 46.0 %   MCV 86.1  78.0 - 100.0 fL   MCH 28.2  26.0 - 34.0 pg   MCHC 32.7  30.0 - 36.0 g/dL   RDW 15.0  11.5 - 15.5 %   Platelets 283  150 - 400 K/uL  PRO B NATRIURETIC PEPTIDE     Status: Abnormal   Collection Time    01/08/13  2:05 PM      Result Value Range   Pro B Natriuretic peptide (BNP) 373.8 (*) 0 - 125  pg/mL  PROTIME-INR     Status: Abnormal   Collection Time    01/08/13  2:05 PM      Result Value Range   Prothrombin Time 18.6 (*) 11.6 - 15.2 seconds   INR 1.60 (*) 0.00 - 1.49  TROPONIN I     Status: Abnormal   Collection Time    01/08/13  2:05 PM      Result Value Range   Troponin I 1.35 (*) <0.30 ng/mL  POCT I-STAT TROPONIN I     Status: Abnormal   Collection Time    01/08/13  2:11 PM      Result Value Range   Troponin i, poc 0.80 (*) 0.00 - 0.08 ng/mL   Comment NOTIFIED PHYSICIAN  Comment 3           APTT     Status: Abnormal   Collection Time    01/08/13  5:28 PM      Result Value Range   aPTT 44 (*) 24 - 37 seconds  GLUCOSE, CAPILLARY     Status: Abnormal   Collection Time    01/08/13  7:51 PM      Result Value Range   Glucose-Capillary 113 (*) 70 - 99 mg/dL  TROPONIN I     Status: None   Collection Time    01/08/13 10:50 PM      Result Value Range   Troponin I <0.30  <0.30 ng/mL  TROPONIN I     Status: None   Collection Time    01/09/13  2:45 AM      Result Value Range   Troponin I <0.30  <0.30 ng/mL  CBC     Status: Abnormal   Collection Time    01/09/13  5:10 AM      Result Value Range   WBC 9.8  4.0 - 10.5 K/uL   RBC 4.01  3.87 - 5.11 MIL/uL   Hemoglobin 11.2 (*) 12.0 - 15.0 g/dL   HCT 35.6 (*) 36.0 - 46.0 %   MCV 88.8  78.0 - 100.0 fL   MCH 27.9  26.0 - 34.0 pg   MCHC 31.5  30.0 - 36.0 g/dL   RDW 15.3  11.5 - 15.5 %   Platelets 228  150 - 400 K/uL  HEPARIN LEVEL (UNFRACTIONATED)     Status: Abnormal   Collection Time    01/09/13  5:10 AM      Result Value Range   Heparin Unfractionated 0.78 (*) 0.30 - 0.70 IU/mL  APTT     Status: Abnormal   Collection Time    01/09/13  5:10 AM      Result Value Range   aPTT 67 (*) 24 - 37 seconds  GLUCOSE, CAPILLARY     Status: Abnormal   Collection Time    01/09/13  6:11 AM      Result Value Range   Glucose-Capillary 148 (*) 70 - 99 mg/dL    Studies/Results: @RISRSLT24 @  Medications:   Reviewed   @PROBHOSP @   ALERT  Patient has 2 medical record numbers  [id number] is the other number   All other notes under this number.  1.  Chest pain  Patient currently feels tight in chest.  Still nauseated. Recent fall (? Syncope)  GI eval with evid of spasm.   Still dyspnea.  Cath in July with mild nonobstructive CAD though vessels are heavily calcified.  Mild pulmonary Hypertension.   With syncope and continued symptoms would recom L heart cath to define anatomy  2.  SOB  Seen by Sabino Donovan. PFT show minimal restriction prob related to wt.  Also with deconditioning.    2.  PAF  Patient remains in [place]  ? If she had rapid afb that lead to syncope last week.  If cath negative consider loop or link to evaluate Oral anticoag on hold  Patient is on heparin    3.  DM  Holding agents   4.  HL  Continue simvistatin.    LOS: 1 day   Dorris Carnes 01/09/2013, 7:41 AM

## 2013-01-09 NOTE — CV Procedure (Signed)
    Cardiac Catheterization Procedure Note  Name: Naylanie Marentes MRN: [id number] DOB: 03-21-1940  Procedure: Left Heart Cath, Selective Coronary Angiography, LV angiography, FFR of the left circumflex  Indication: Chest pain, syncope, equivocal enzymes. Unstable angina.   Procedural Details: The right wrist was prepped, draped, and anesthetized with 1% lidocaine. Using the modified Seldinger technique, a 5 French sheath was introduced into the right radial artery. 3 mg of verapamil was administered through the sheath, weight-based unfractionated heparin was administered intravenously. Standard Judkins catheters were used for selective coronary angiography and left ventriculography. Catheter exchanges were performed over an exchange length guidewire. After the diagnostic procedure, I elected to do FFR of the circumflex. The vessel had an eccentric 50-75% stenosis. Additional heparin was given. Once a therapeutic ACT was achieved, an XB-LAD 3.5 cm guide was inserted. The pressure wire was zeroed and normalized at the guide tip. It was then advanced past the lesion in the mid-circumflex after IC NTG was administered. IV adenosine was administered and the FFR at peak hyperemia was 0.93. The wire and guide catheter were removed. There were no immediate procedural complications. A TR band was used for radial hemostasis at the completion of the procedure.  The patient was transferred to the post catheterization recovery area for further monitoring.  Procedural Findings: Hemodynamics: AO 103/56 LV 105/14  Coronary angiography: Coronary dominance: right  Left mainstem: Patent without obstructive disease. Mild calcification.  Left anterior descending (LAD): Heavy calcification. 50% proximal and 50% mid-vessel stenosis. Large first diagonal without stenosis.   Left circumflex (LCx): normal caliber vessel. Intermediate branch has no stenosis. Mid-AV groove circumflex has a 50-60% eccentric  stenosis.  Right coronary artery (RCA): Heavy calcium in the right cusp. The vessel is widely patent with minimal irregularity and patency of the PDA and PLA branches. Right dominant vessel.  Left ventriculography: Left ventricular systolic function is normal, LVEF is estimated at 55-65%, there is no significant mitral regurgitation   Final Conclusions:   1. Heavily calcified but nonobstructive CAD with negative pressure wire analysis of the left circumflex.  2. Normal LV function  Recommendations: med Rx for nonobstructive CAD.  Sherren Mocha 01/09/2013, 5:55 PM

## 2013-01-09 NOTE — Progress Notes (Signed)
[place] for Heparin  Indication: ACS/NSTEMI  Allergies  Allergen Reactions  . Avelox [Moxifloxacin Hcl In Nacl]   . Cefprozil   . Cetacaine [Butamben-Tetracaine-Benzocaine]   . Dicyclomine   . Diltiazem   . Hydrocodone   . Imdur [Isosorbide]   . Januvia [Sitagliptin]   . Lipitor [Atorvastatin]   . Losartan   . Nitroglycerin     Made patient go into cardiac arrest  . Oxycodone   . Penicillins   . Prednisone   . Vancomycin     Patient Measurements: Height: 5\' 1"  (154.9 cm) Weight: 195 lb (88.451 kg) IBW/kg (Calculated) : 47.8   Vital Signs: Temp: 98.3 F (36.8 C) (12/15 0519) Temp src: Oral (12/15 0519) BP: 91/49 mmHg (12/15 0519) Pulse Rate: 66 (12/15 0519)  Labs:  Recent Labs  01/08/13 1405 01/08/13 1728 01/08/13 2250 01/09/13 0245 01/09/13 0510  HGB 12.6  --   --   --  11.2*  HCT 38.5  --   --   --  35.6*  PLT 283  --   --   --  228  APTT  --  44*  --   --  67*  LABPROT 18.6*  --   --   --   --   INR 1.60*  --   --   --   --   HEPARINUNFRC  --   --   --   --  0.78*  CREATININE 0.79  --   --   --   --   TROPONINI 1.35*  --  <0.30 <0.30  --     Estimated Creatinine Clearance: 66.2 ml/min (by C-G formula based on Cr of 0.79).  Assessment: 72 yo female with ACS for heparin  Goal of Therapy:  Heparin level 0.3-0.7 units/ml Monitor platelets by anticoagulation protocol: Yes   Plan:  Continue Heparin at current rate   Phillis Knack, PharmD, BCPS

## 2013-01-09 NOTE — Progress Notes (Signed)
Patient back from cath lab; deflating TR band 3cc every 15 minutes per order; 5cc left at change of shift; no signs of bleeding or hematoma.  [name], Allison Dolly

## 2013-01-09 NOTE — Progress Notes (Signed)
Called by nurse re: heparin which was dc'd post cath, pharmacy wondering about restarting Xarelto tonight. D/w Dr. Acie Fredrickson regarding timing post-cath - at this time we will hold off on restarting anticoag tonight due to possible risk of post-cath bleeding. If cath site stable would resume in AM. Melina Copa PA-C

## 2013-01-09 NOTE — H&P (View-Only) (Signed)
Subjective: Still feels bad.  HA (posterior on L), chest tightness (like elephant), N/ Objective: Filed Vitals:   01/08/13 1840 01/08/13 1955 01/08/13 2300 01/09/13 0519  BP: 125/75 107/58  91/49  Pulse: 87 60  66  Temp: 98.2 F (36.8 C) 98.3 F (36.8 C)  98.3 F (36.8 C)  TempSrc: Oral Oral  Oral  Resp: 18 18  18   Height:      Weight:      SpO2: 99% 98% 97% 91%   Weight change:   Intake/Output Summary (Last 24 hours) at 01/09/13 0741 Last data filed at 01/08/13 2200  Gross per 24 hour  Intake  279.2 ml  Output      0 ml  Net  279.2 ml    General: Alert, awake, oriented x3, in no acute distress Neck:  JVP is normal Heart: Regular rate and rhythm, without murmurs, rubs, gallops.  Lungs: Clear to auscultation.  No rales or wheezes. Exemities:  No edema.   Neuro: Grossly intact, nonfocal.  Tele:   SR  Lab Results: Results for orders placed during the hospital encounter of 01/08/13 (from the past 24 hour(s))  BASIC METABOLIC PANEL     Status: Abnormal   Collection Time    01/08/13  2:05 PM      Result Value Range   Sodium 136  135 - 145 mEq/L   Potassium 3.2 (*) 3.5 - 5.1 mEq/L   Chloride 93 (*) 96 - 112 mEq/L   CO2 27  19 - 32 mEq/L   Glucose, Bld 189 (*) 70 - 99 mg/dL   BUN 18  6 - 23 mg/dL   Creatinine, Ser 0.79  0.50 - 1.10 mg/dL   Calcium 10.0  8.4 - 10.5 mg/dL   GFR calc non Af Amer 82 (*) >90 mL/min   GFR calc Af Amer >90  >90 mL/min  CBC     Status: Abnormal   Collection Time    01/08/13  2:05 PM      Result Value Range   WBC 11.5 (*) 4.0 - 10.5 K/uL   RBC 4.47  3.87 - 5.11 MIL/uL   Hemoglobin 12.6  12.0 - 15.0 g/dL   HCT 38.5  36.0 - 46.0 %   MCV 86.1  78.0 - 100.0 fL   MCH 28.2  26.0 - 34.0 pg   MCHC 32.7  30.0 - 36.0 g/dL   RDW 15.0  11.5 - 15.5 %   Platelets 283  150 - 400 K/uL  PRO B NATRIURETIC PEPTIDE     Status: Abnormal   Collection Time    01/08/13  2:05 PM      Result Value Range   Pro B Natriuretic peptide (BNP) 373.8 (*) 0 - 125  pg/mL  PROTIME-INR     Status: Abnormal   Collection Time    01/08/13  2:05 PM      Result Value Range   Prothrombin Time 18.6 (*) 11.6 - 15.2 seconds   INR 1.60 (*) 0.00 - 1.49  TROPONIN I     Status: Abnormal   Collection Time    01/08/13  2:05 PM      Result Value Range   Troponin I 1.35 (*) <0.30 ng/mL  POCT I-STAT TROPONIN I     Status: Abnormal   Collection Time    01/08/13  2:11 PM      Result Value Range   Troponin i, poc 0.80 (*) 0.00 - 0.08 ng/mL   Comment NOTIFIED PHYSICIAN  Comment 3           APTT     Status: Abnormal   Collection Time    01/08/13  5:28 PM      Result Value Range   aPTT 44 (*) 24 - 37 seconds  GLUCOSE, CAPILLARY     Status: Abnormal   Collection Time    01/08/13  7:51 PM      Result Value Range   Glucose-Capillary 113 (*) 70 - 99 mg/dL  TROPONIN I     Status: None   Collection Time    01/08/13 10:50 PM      Result Value Range   Troponin I <0.30  <0.30 ng/mL  TROPONIN I     Status: None   Collection Time    01/09/13  2:45 AM      Result Value Range   Troponin I <0.30  <0.30 ng/mL  CBC     Status: Abnormal   Collection Time    01/09/13  5:10 AM      Result Value Range   WBC 9.8  4.0 - 10.5 K/uL   RBC 4.01  3.87 - 5.11 MIL/uL   Hemoglobin 11.2 (*) 12.0 - 15.0 g/dL   HCT 35.6 (*) 36.0 - 46.0 %   MCV 88.8  78.0 - 100.0 fL   MCH 27.9  26.0 - 34.0 pg   MCHC 31.5  30.0 - 36.0 g/dL   RDW 15.3  11.5 - 15.5 %   Platelets 228  150 - 400 K/uL  HEPARIN LEVEL (UNFRACTIONATED)     Status: Abnormal   Collection Time    01/09/13  5:10 AM      Result Value Range   Heparin Unfractionated 0.78 (*) 0.30 - 0.70 IU/mL  APTT     Status: Abnormal   Collection Time    01/09/13  5:10 AM      Result Value Range   aPTT 67 (*) 24 - 37 seconds  GLUCOSE, CAPILLARY     Status: Abnormal   Collection Time    01/09/13  6:11 AM      Result Value Range   Glucose-Capillary 148 (*) 70 - 99 mg/dL    Studies/Results: @RISRSLT24 @  Medications:   Reviewed   @PROBHOSP @   ALERT  Patient has 2 medical record numbers  [id number] is the other number   All other notes under this number.  1.  Chest pain  Patient currently feels tight in chest.  Still nauseated. Recent fall (? Syncope)  GI eval with evid of spasm.   Still dyspnea.  Cath in July with mild nonobstructive CAD though vessels are heavily calcified.  Mild pulmonary Hypertension.   With syncope and continued symptoms would recom L heart cath to define anatomy  2.  SOB  Seen by Sabino Donovan. PFT show minimal restriction prob related to wt.  Also with deconditioning.    2.  PAF  Patient remains in [place]  ? If she had rapid afb that lead to syncope last week.  If cath negative consider loop or link to evaluate Oral anticoag on hold  Patient is on heparin    3.  DM  Holding agents   4.  HL  Continue simvistatin.    LOS: 1 day   Dorris Carnes 01/09/2013, 7:41 AM

## 2013-01-09 NOTE — Progress Notes (Signed)
Talked to Dr. Marigene Ehlers and received orders to discontinue Adenosine IV that was on pt's scheduled medications for 22:00. Pt is stable with heart rate of 74 and blood pressure of 105/58.

## 2013-01-10 ENCOUNTER — Encounter (HOSPITAL_COMMUNITY)
Admission: EM | Disposition: A | Discharge status: Home / Self Care | Payer: Self-pay | Source: Home / Self Care | Attending: Cardiology

## 2013-01-10 DIAGNOSIS — E1149 Type 2 diabetes mellitus with other diabetic neurological complication: Secondary | ICD-10-CM

## 2013-01-10 DIAGNOSIS — I214 Non-ST elevation (NSTEMI) myocardial infarction: Secondary | ICD-10-CM

## 2013-01-10 DIAGNOSIS — I4891 Unspecified atrial fibrillation: Secondary | ICD-10-CM

## 2013-01-10 DIAGNOSIS — R55 Syncope and collapse: Secondary | ICD-10-CM

## 2013-01-10 DIAGNOSIS — E876 Hypokalemia: Secondary | ICD-10-CM

## 2013-01-10 DIAGNOSIS — E1142 Type 2 diabetes mellitus with diabetic polyneuropathy: Secondary | ICD-10-CM

## 2013-01-10 HISTORY — PX: LOOP RECORDER IMPLANT: SHX5477

## 2013-01-10 LAB — GLUCOSE, CAPILLARY
Glucose-Capillary: 139 mg/dL — ABNORMAL HIGH (ref 70–99)
Glucose-Capillary: 173 mg/dL — ABNORMAL HIGH (ref 70–99)
Glucose-Capillary: 206 mg/dL — ABNORMAL HIGH (ref 70–99)
Glucose-Capillary: 189 mg/dL — ABNORMAL HIGH (ref 70–99)

## 2013-01-10 SURGERY — LOOP RECORDER IMPLANT
Anesthesia: LOCAL

## 2013-01-10 MED ORDER — LIDOCAINE-EPINEPHRINE 1 %-1:100000 IJ SOLN
INTRAMUSCULAR | Status: AC
  Filled 2013-01-10: qty 1

## 2013-01-10 MED ORDER — ALUM & MAG HYDROXIDE-SIMETH 200-200-20 MG/5ML PO SUSP: 30.0000 mL | ORAL | Status: DC | PRN

## 2013-01-10 MED ORDER — RIVAROXABAN 20 MG PO TABS
20.0000 mg | ORAL_TABLET | ORAL | Status: AC
  Administered 2013-01-10: 20 mg via ORAL
  Filled 2013-01-10: qty 1

## 2013-01-10 MED ORDER — RIVAROXABAN 20 MG PO TABS
20.0000 mg | ORAL_TABLET | Freq: Every day | ORAL | Status: DC
  Filled 2013-01-10: qty 1

## 2013-01-10 MED ORDER — CYCLOBENZAPRINE HCL 5 MG PO TABS
5.0000 mg | ORAL_TABLET | Freq: Three times a day (TID) | ORAL | Status: DC
  Administered 2013-01-10: 5 mg via ORAL
  Filled 2013-01-10 (×3): qty 1

## 2013-01-10 NOTE — Consult Note (Signed)
Pharmacy Note-Anticoagulation  Pharmacy Consult :  72 y.o. female is to be restarted on Xarelto  for PAF.   Latest Labs : Hematology : Lab Results  Component Value Date   INR 1.60* 01/08/2013   HEPARINUNFRC 0.78* 01/09/2013   HGB 11.2* 01/09/2013   HGB 12.6 01/08/2013    Current Medication[s] Include: Scheduled:  Prescriptions prior to admission  Medication Sig Dispense Refill  . acetaminophen (TYLENOL) 500 MG tablet Take 500 mg by mouth daily as needed for moderate pain.      . cholecalciferol (VITAMIN D) 1000 UNITS tablet Take 5,000 Units by mouth daily.      . clonazePAM (KLONOPIN) 0.5 MG tablet Take 0.5 mg by mouth 2 (two) times daily as needed for anxiety.      Marland Kitchen estradiol (ESTRACE) 1 MG tablet Take 1 mg by mouth daily.      Marland Kitchen exenatide (BYETTA 5 MCG PEN) 5 MCG/0.02ML SOPN injection Inject 5 mcg into the skin 2 (two) times daily with a meal.      . glipiZIDE (GLUCOTROL XL) 10 MG 24 hr tablet Take 10 mg by mouth daily with breakfast.      . hydrochlorothiazide (MICROZIDE) 12.5 MG capsule Take 12.5 mg by mouth daily.      . lansoprazole (PREVACID) 30 MG capsule Take 30 mg by mouth 2 (two) times daily before a meal.      . levothyroxine (SYNTHROID, LEVOTHROID) 137 MCG tablet Take 137 mcg by mouth daily before breakfast.      . metFORMIN (GLUCOPHAGE-XR) 500 MG 24 hr tablet Take 500 mg by mouth 2 (two) times daily.      . metoprolol tartrate (LOPRESSOR) 25 MG tablet Take 25 mg by mouth 2 (two) times daily.      . promethazine (PHENERGAN) 25 MG tablet Take 25 mg by mouth every 6 (six) hours as needed for nausea or vomiting.      . Rivaroxaban (XARELTO) 20 MG TABS tablet Take 20 mg by mouth daily with supper.      . simvastatin (ZOCOR) 40 MG tablet Take 40 mg by mouth every evening.      . traMADol (ULTRAM) 50 MG tablet Take 100 mg by mouth every 6 (six) hours as needed for moderate pain.       Scheduled:  . aspirin EC  81 mg Oral Daily  . cholecalciferol  5,000 Units Oral Daily  .  cyclobenzaprine  5 mg Oral TID  . estradiol  1 mg Oral Daily  . hydrochlorothiazide  12.5 mg Oral Daily  . insulin aspart  0-5 Units Subcutaneous QHS  . insulin aspart  0-9 Units Subcutaneous TID WC  . levothyroxine  137 mcg Oral QAC breakfast  . metoprolol tartrate  25 mg Oral BID  . pantoprazole  20 mg Oral Daily  . potassium chloride  40 mEq Oral Once  . simvastatin  40 mg Oral QPM  . sodium chloride  3 mL Intravenous Q12H  . sodium chloride  3 mL Intravenous Q12H   Infusion[s]: Infusions:  . sodium chloride 75 mL/hr at 01/10/13 0132   Assessment :    72 y/o female on Xarelto PTA for VTE prophylaxis due to history of PAF.    Last INR 1.6, Xarelto does affect INR, but cannot be used for monitoring..    Heparin stopped and Xarelto to be resumed for VTE prophylaxis   No bleeding complications observed.  Goal :  Xarelto at VTE prophylaxis doses, 20 mg daily.  Renal  function with CCL > 60 requiring no dose modification.  Plan : 1. Begin Xarelto 20 mg daily. 2. Will review Xarelto discharge instructions. 3. Monitor for bleeding complications   Estelle June, Pharm.D. 01/10/2013  11:16 AM

## 2013-01-10 NOTE — Progress Notes (Signed)
Subjective: The patient still had chest pain, improved from yesterday.    Marland Kitchen aspirin EC  81 mg Oral Daily  . cholecalciferol  5,000 Units Oral Daily  . estradiol  1 mg Oral Daily  . hydrochlorothiazide  12.5 mg Oral Daily  . insulin aspart  0-5 Units Subcutaneous QHS  . insulin aspart  0-9 Units Subcutaneous TID WC  . levothyroxine  137 mcg Oral QAC breakfast  . metoprolol tartrate  25 mg Oral BID  . pantoprazole  20 mg Oral Daily  . potassium chloride  40 mEq Oral Once  . simvastatin  40 mg Oral QPM  . sodium chloride  3 mL Intravenous Q12H  . sodium chloride  3 mL Intravenous Q12H   Objective: Filed Vitals:   01/09/13 1955 01/09/13 2019 01/09/13 2142 01/10/13 0410  BP: 106/59 105/58 91/36 93/58   Pulse: 62 62  66  Temp:  98.1 F (36.7 C)  98.1 F (36.7 C)  TempSrc:  Oral  Oral  Resp:  18  18  Height:      Weight:      SpO2:  92%  91%   Weight change:   Intake/Output Summary (Last 24 hours) at 01/10/13 0743 Last data filed at 01/09/13 1300  Gross per 24 hour  Intake    240 ml  Output      0 ml  Net    240 ml   General: Alert, awake, oriented x3, in no acute distress Neck:  JVP is normal Heart: Regular rate and rhythm, without murmurs, rubs, gallops.  Lungs: Clear to auscultation.  No rales or wheezes. Exemities:  No edema.   Neuro: Grossly intact, nonfocal.  Tele:   SR  Lab Results: Results for orders placed during the hospital encounter of 01/08/13 (from the past 24 hour(s))  TROPONIN I     Status: None   Collection Time    01/09/13  9:33 AM      Result Value Range   Troponin I <0.30  <0.30 ng/mL  BASIC METABOLIC PANEL     Status: Abnormal   Collection Time    01/09/13 11:05 AM      Result Value Range   Sodium 137  135 - 145 mEq/L   Potassium 3.7  3.5 - 5.1 mEq/L   Chloride 95 (*) 96 - 112 mEq/L   CO2 25  19 - 32 mEq/L   Glucose, Bld 280 (*) 70 - 99 mg/dL   BUN 14  6 - 23 mg/dL   Creatinine, Ser 0.81  0.50 - 1.10 mg/dL   Calcium 9.1  8.4 - 10.5  mg/dL   GFR calc non Af Amer 72 (*) >90 mL/min   GFR calc Af Amer 83 (*) >90 mL/min  GLUCOSE, CAPILLARY     Status: Abnormal   Collection Time    01/09/13 11:22 AM      Result Value Range   Glucose-Capillary 263 (*) 70 - 99 mg/dL   Comment 1 Notify RN    POCT ACTIVATED CLOTTING TIME     Status: None   Collection Time    01/09/13  5:30 PM      Result Value Range   Activated Clotting Time 221    GLUCOSE, CAPILLARY     Status: Abnormal   Collection Time    01/09/13  8:58 PM      Result Value Range   Glucose-Capillary 139 (*) 70 - 99 mg/dL  COMPREHENSIVE METABOLIC PANEL     Status:  Abnormal   Collection Time    01/09/13  9:19 PM      Result Value Range   Sodium 137  135 - 145 mEq/L   Potassium 3.7  3.5 - 5.1 mEq/L   Chloride 96  96 - 112 mEq/L   CO2 28  19 - 32 mEq/L   Glucose, Bld 170 (*) 70 - 99 mg/dL   BUN 11  6 - 23 mg/dL   Creatinine, Ser 0.85  0.50 - 1.10 mg/dL   Calcium 9.4  8.4 - 10.5 mg/dL   Total Protein 7.0  6.0 - 8.3 g/dL   Albumin 3.4 (*) 3.5 - 5.2 g/dL   AST 27  0 - 37 U/L   ALT 12  0 - 35 U/L   Alkaline Phosphatase 51  39 - 117 U/L   Total Bilirubin 0.3  0.3 - 1.2 mg/dL   GFR calc non Af Amer 68 (*) >90 mL/min   GFR calc Af Amer 79 (*) >90 mL/min  GLUCOSE, CAPILLARY     Status: Abnormal   Collection Time    01/10/13  6:34 AM      Result Value Range   Glucose-Capillary 173 (*) 70 - 99 mg/dL    Cath 01/09/13 Cath:Coronary angiography:  Coronary dominance: right  Left mainstem: Patent without obstructive disease. Mild calcification.  Left anterior descending (LAD): Heavy calcification. 50% proximal and 50% mid-vessel stenosis. Large first diagonal without stenosis.  Left circumflex (LCx): normal caliber vessel. Intermediate branch has no stenosis. Mid-AV groove circumflex has a 50-60% eccentric stenosis.  Right coronary artery (RCA): Heavy calcium in the right cusp. The vessel is widely patent with minimal irregularity and patency of the PDA and PLA  branches. Right dominant vessel.  Left ventriculography: Left ventricular systolic function is normal, LVEF is estimated at 55-65%, there is no significant mitral regurgitation  Final Conclusions:  1. Heavily calcified but nonobstructive CAD with negative pressure wire analysis of the left circumflex.  2. Normal LV function  Recommendations: med Rx for nonobstructive CAD.   Assessment and Plan:  1.  Chest pain  Patient still has pain that feels like indigestion. Cath yesterday showed heavily calcified but nonobstructive CAD with negative pressure wire analysis of the left circumflex, we will continue medical therapy  2.  SOB  Seen by Sabino Donovan. PFT show minimal restriction prob related to wt.  Also with deconditioning.    2.  PAF  Patient remains in [place]. She possibly had rapid afb that lead to syncope last week.  Cath is negative, so we will consider loop or link to evaluate, off heparin. Oral anticoag - restart Xarelto today    3.  DM  Holding agents   4.  HL  Continue simvstatin.     LOS: 2 days   Ena Dawley, H 01/10/2013, 7:43 AM

## 2013-01-10 NOTE — Consult Note (Signed)
ELECTROPHYSIOLOGY CONSULT NOTE    Patient ID: [name] MRN: [id number], DOB/AGE: 02/19/1940 72 y.o.  Admit date: 01/08/2013 Date of Consult: 01-10-2013  Primary Physician: Octavio Graves, DO Primary Cardiologist: Peter [name], MD  Reason for Consultation: syncope [name] is a 72 year old female with a past medical history significant for diabetes, atrial fibrillation (on Xarelto), hypertension, hyperlipidemia, hypothyroidism, obesity, GERD, and chronic dyspnea on exertion.  She has had significant work up in the past including echocardiogram, right and left heart cath, PFT's and sleep study.   Echocardiogram in January of this year demonstrated an EF of [id number], grade 1 diastolic dysfunction, calcified mitral annulus, and left atrium mildly dilated (LA 38).  Right and left heart cath in July of this year demonstrated non obstructive coronary disease, normal LV function, and mild pulmonary hypertension.  Repeat cath this admission demonstrated heavily calcified but non obstructive CAD with negative pressure wire analysis of the left circumflex and normal LV function.  She presented to the hospital on 12-14 with generalized body aches and elevated troponins.  She was admitted for further evaluation.   She states that she was diagnosed with atrial fibrillation during her admission in January.  At that time, she would have periods of atrial fibrillation with relatively controlled ventricular rates.  There is no 12 lead of afib.  Since then, she has "good days" and "bad days".  She feels like her bad days are associated with palpitations and an irregular heart beat.  On these days, she has increased dyspnea on exertion as well as fatigue.  On Monday of this week, she was outside in her yard with her son in law.  She took a branch from him, he turned around, and heard her hit the ground face first.  She had no prodrome.  He says she was out for a few seconds.  There was no seizure activity.   When she awoke, she had no dizziness or other symptoms.  She has had one other episode of syncope a few months ago.  She had been standing washing dishes, felt fatigued, was walking into her living room to sit down and fell into the chair.    Lab work this admission is notable for an initial troponin of 0.8, normal since, HgbA1C of 7.2, K of 3.2, WBC 11.5.  Last TSH assessed in 01-2012 and was normal.   Prior to January, she was very independent caring for other family members, going grocery shopping, and cleaning her house. Since she has had palpitations and a sensation of an irregular heart beat, her energy level has declined significantly on her bad days.  EP has been asked to evaluate for treatment options.  ROS is negative except as outlined above.   Past Medical History  Diagnosis Date  . Atrial fibrillation   . Diabetes mellitus without complication   . Neuropathy   . Diverticulitis      Surgical History:  Past Surgical History  Procedure Laterality Date  . Cholecystectomy       Prescriptions prior to admission  Medication Sig Dispense Refill  . acetaminophen (TYLENOL) 500 MG tablet Take 500 mg by mouth daily as needed for moderate pain.      . cholecalciferol (VITAMIN D) 1000 UNITS tablet Take 5,000 Units by mouth daily.      . clonazePAM (KLONOPIN) 0.5 MG tablet Take 0.5 mg by mouth 2 (two) times daily as needed for anxiety.      Marland Kitchen estradiol (ESTRACE) 1 MG tablet  Take 1 mg by mouth daily.      Marland Kitchen exenatide (BYETTA 5 MCG PEN) 5 MCG/0.02ML SOPN injection Inject 5 mcg into the skin 2 (two) times daily with a meal.      . glipiZIDE (GLUCOTROL XL) 10 MG 24 hr tablet Take 10 mg by mouth daily with breakfast.      . hydrochlorothiazide (MICROZIDE) 12.5 MG capsule Take 12.5 mg by mouth daily.      . lansoprazole (PREVACID) 30 MG capsule Take 30 mg by mouth 2 (two) times daily before a meal.      . levothyroxine (SYNTHROID, LEVOTHROID) 137 MCG tablet Take 137 mcg by mouth daily before  breakfast.      . metFORMIN (GLUCOPHAGE-XR) 500 MG 24 hr tablet Take 500 mg by mouth 2 (two) times daily.      . metoprolol tartrate (LOPRESSOR) 25 MG tablet Take 25 mg by mouth 2 (two) times daily.      . promethazine (PHENERGAN) 25 MG tablet Take 25 mg by mouth every 6 (six) hours as needed for nausea or vomiting.      . Rivaroxaban (XARELTO) 20 MG TABS tablet Take 20 mg by mouth daily with supper.      . simvastatin (ZOCOR) 40 MG tablet Take 40 mg by mouth every evening.      . traMADol (ULTRAM) 50 MG tablet Take 100 mg by mouth every 6 (six) hours as needed for moderate pain.        Inpatient Medications:  . aspirin EC  81 mg Oral Daily  . cholecalciferol  5,000 Units Oral Daily  . estradiol  1 mg Oral Daily  . hydrochlorothiazide  12.5 mg Oral Daily  . insulin aspart  0-5 Units Subcutaneous QHS  . insulin aspart  0-9 Units Subcutaneous TID WC  . levothyroxine  137 mcg Oral QAC breakfast  . metoprolol tartrate  25 mg Oral BID  . pantoprazole  20 mg Oral Daily  . potassium chloride  40 mEq Oral Once  . [START ON 01/11/2013] Rivaroxaban  20 mg Oral Q supper  . Rivaroxaban  20 mg Oral NOW  . simvastatin  40 mg Oral QPM  . sodium chloride  3 mL Intravenous Q12H  . sodium chloride  3 mL Intravenous Q12H    Allergies:  Allergies  Allergen Reactions  . Avelox [Moxifloxacin Hcl In Nacl]   . Cefprozil   . Cetacaine [Butamben-Tetracaine-Benzocaine]   . Dicyclomine   . Diltiazem   . Hydrocodone   . Imdur [Isosorbide]   . Januvia [Sitagliptin]   . Lipitor [Atorvastatin]   . Losartan   . Nitroglycerin     Made patient go into cardiac arrest  . Oxycodone   . Penicillins   . Prednisone   . Vancomycin     History   Social History  . Marital Status: Widowed    Spouse Name: N/A    Number of Children: N/A  . Years of Education: N/A   Occupational History  . Not on file.   Social History Main Topics  . Smoking status: Never Smoker   . Smokeless tobacco: Not on file  .  Alcohol Use: No  . Drug Use: No  . Sexual Activity: No   Other Topics Concern  . Not on file   Social History Narrative  . No narrative on file     History reviewed. No pertinent family history.   Physical Exam: Filed Vitals:   01/09/13 2142 01/10/13 0410 01/10/13 [id number] 01/10/13  1419  BP: 91/36 93/58 110/74 111/64  Pulse:  66 69 68  Temp:  98.1 F (36.7 C)  98.4 F (36.9 C)  TempSrc:  Oral  Oral  Resp:  18  18  Height:      Weight:      SpO2:  91%  93%    GEN- The patient is oberse appearing, alert and oriented x 3 today.   Head- normocephalic, atraumatic Eyes-  Sclera clear, conjunctiva pink Ears- hearing intact Oropharynx- clear Neck- supple, no JVP, carotid massage manuever normal Lungs- Clear to ausculation bilaterally, normal work of breathing Heart- Regular rate and rhythm, no murmurs, rubs or gallops, PMI not laterally displaced GI- soft, NT, ND, + BS Extremities- no clubbing, cyanosis, or edema MS- no significant deformity or atrophy Skin- no rash or lesion Psych- euthymic mood, full affect Neuro- strength and sensation are intact   Labs:   Lab Results  Component Value Date   WBC 9.8 01/09/2013   HGB 11.2* 01/09/2013   HCT 35.6* 01/09/2013   MCV 88.8 01/09/2013   PLT 228 01/09/2013    Recent Labs Lab 01/09/13 2119  NA 137  K 3.7  CL 96  CO2 28  BUN 11  CREATININE 0.85  CALCIUM 9.4  PROT 7.0  BILITOT 0.3  ALKPHOS 51  ALT 12  AST 27  GLUCOSE 170*   Lab Results  Component Value Date   TROPONINI <0.30 01/09/2013    Radiology/Studies: Dg Chest Port 1 View 01/08/2013   CLINICAL DATA:  Shortness of breath  EXAM: PORTABLE CHEST - 1 VIEW  COMPARISON:  None.  FINDINGS: The heart size and mediastinal contours are within normal limits. Both lungs are clear. The visualized skeletal structures are unremarkable.  IMPRESSION: No active disease.   Electronically Signed   By: Inez Catalina M.D.   On: 01/08/2013 14:37    EKG: sinus rhythm, 1st degree  AV block, otherwise normal  TELEMETRY: sinus, no arrhythmias  A/P 1. Syncope The patient has recurrent unexplained syncope. Her EKG is benign and EF by cath is normal.  She has a h/o afib but has been in sinus rhythm here.  She is appropriately anticoagulated with xarelto.  She has previously tried wearing an event monitor and could not tolerate this due to skin irritation. I think that the most appropriate strategy at this time is to place an implantable loop recorder for evaluation of recurrent syncope and also for afib management.  Risks, benefits, and alternatives to implantable loop recorder placement were discussed at length with the patient and her family who wish to proceed.  We will place implantable loop recorder later today.  Carotid dopplers and echo are pending. I have informed the patient of DMV driving restriction.  She is aware that she cannot drive for 6 months.

## 2013-01-10 NOTE — Care Management Note (Unsigned)
    Page 1 of 1   01/10/2013     3:16:59 PM   CARE MANAGEMENT NOTE 01/10/2013  Patient:  [name]   Account Number:  [id number]  Date Initiated:  01/10/2013  Documentation initiated by:  Dajon Rowe  Subjective/Objective Assessment:   PT ADM WITH CHEST PAIN, POS TROP.   PTA, PT INDEPENDENT OF ADLS.     Action/Plan:   WILL FOLLOW FOR DC  NEEDS AS PT PROGRESSES.   Anticipated DC Date:  01/10/2013   Anticipated DC Plan:  [place]  CM consult      Choice offered to / List presented to:             Status of service:  In process, will continue to follow Medicare Important Message given?   (If response is "NO", the following Medicare IM given date fields will be blank) Date Medicare IM given:   Date Additional Medicare IM given:    Discharge Disposition:    Per UR Regulation:  Reviewed for med. necessity/level of care/duration of stay  If discussed at [place] of Stay Meetings, dates discussed:    Comments:

## 2013-01-10 NOTE — Progress Notes (Signed)
01/10/2013 2:23 PM Nursing note Pt. C/o SOB sensation after taking first dose of flexeril ordered this morning. Pt. VSS, oxygen level 93% on room air. EKG obtained. No acute changes noted from previous EKG. Rosaria Ferries [facility] paged and made aware. Orders received to d/c flexeril. Pt. Updated on plan. Will continue to closely monitor patient.  Mckinzy Fuller, Arville Lime

## 2013-01-10 NOTE — Progress Notes (Signed)
01/10/2013 1800 Nursing note Confirmed with Dr. Rayann Heman ok to administer scheduled Xarelto after implantable loop recorder insertion this evening. Orders enacted. Pt. Updated on plan.  [name], Arville Lime

## 2013-01-10 NOTE — Interval H&P Note (Signed)
History and Physical Interval Note:  01/10/2013 4:44 PM  [name]  has presented today for surgery, with the diagnosis of Syncope  The various methods of treatment have been discussed with the patient and family. After consideration of risks, benefits and other options for treatment, the patient has consented to  Procedure(s): LOOP RECORDER IMPLANT (N/A) as a surgical intervention .  The patient's history has been reviewed, patient examined, no change in status, stable for surgery.  I have reviewed the patient's chart and labs.  Questions were answered to the patient's satisfaction.     [name]

## 2013-01-10 NOTE — Procedures (Signed)
SURGEON:  [name], MD     PREPROCEDURE DIAGNOSIS:  Unexplained syncope    POSTPROCEDURE DIAGNOSIS:  Unexplained syncope     PROCEDURES:   1. Implantable loop recorder implantation    INTRODUCTION:  [name] is a 72 y.o. female with a history of unexplained syncope who presents today for implantable loop implantation.  She has a h/o afib but no other arrhythmias.   Despite an extensive workup for syncope no reversible causes have been identified.  she has worn telemetry during which she did not have arrhythmias.  There is significant concern for an arrhythmia as the cause for the patients stroke.  The patient therefore presents today for implantable loop implantation.     DESCRIPTION OF PROCEDURE:  Informed written consent was obtained, and the patient was brought to the electrophysiology lab in a fasting state.  The patient required no sedation for the procedure today.  Mapping over the patient's chest was performed by the EP lab staff to identify the area where electrograms were most prominent for ILR recording.  This area was found to be the left parasternal region over the 3rd-4th intercostal space. The patients left chest was therefore prepped and draped in the usual sterile fashion by the EP lab staff. The skin overlying the left parasternal region was infiltrated with lidocaine for local analgesia.  A 0.5-cm incision was made over the left parasternal region over the 3rd intercostal space.  A subcutaneous ILR pocket was fashioned using a combination of sharp and blunt dissection.  A Medtronic Reveal Modena model [id number] implantable loop recorder was then placed into the pocket  R waves were very prominent and measured 0.95mV.  Steri- Strips and a sterile dressing were then applied.  There were no early apparent complications.     CONCLUSIONS:   1. Successful implantation of a Medtronic Reveal LINQ implantable loop recorder for cryptogenic stroke  2. No early apparent  complications.

## 2013-01-10 NOTE — H&P (View-Only) (Signed)
ELECTROPHYSIOLOGY CONSULT NOTE    Patient ID: [name] MRN: [id number], DOB/AGE: [birth year] 72 y.o.  Admit date: 01/08/2013 Date of Consult: 01-10-2013  Primary Physician: Octavio Graves, DO Primary Cardiologist: Peter [name], MD  Reason for Consultation: syncope [name] is a 72 year old female with a past medical history significant for diabetes, atrial fibrillation (on Xarelto), hypertension, hyperlipidemia, hypothyroidism, obesity, GERD, and chronic dyspnea on exertion.  She has had significant work up in the past including echocardiogram, right and left heart cath, PFT's and sleep study.   Echocardiogram in January of this year demonstrated an EF of [id number], grade 1 diastolic dysfunction, calcified mitral annulus, and left atrium mildly dilated (LA 38).  Right and left heart cath in July of this year demonstrated non obstructive coronary disease, normal LV function, and mild pulmonary hypertension.  Repeat cath this admission demonstrated heavily calcified but non obstructive CAD with negative pressure wire analysis of the left circumflex and normal LV function.  She presented to the hospital on 12-14 with generalized body aches and elevated troponins.  She was admitted for further evaluation.   She states that she was diagnosed with atrial fibrillation during her admission in January.  At that time, she would have periods of atrial fibrillation with relatively controlled ventricular rates.  There is no 12 lead of afib.  Since then, she has "good days" and "bad days".  She feels like her bad days are associated with palpitations and an irregular heart beat.  On these days, she has increased dyspnea on exertion as well as fatigue.  On Monday of this week, she was outside in her yard with her son in law.  She took a branch from him, he turned around, and heard her hit the ground face first.  She had no prodrome.  He says she was out for a few seconds.  There was no seizure activity.   When she awoke, she had no dizziness or other symptoms.  She has had one other episode of syncope a few months ago.  She had been standing washing dishes, felt fatigued, was walking into her living room to sit down and fell into the chair.    Lab work this admission is notable for an initial troponin of 0.8, normal since, HgbA1C of 7.2, K of 3.2, WBC 11.5.  Last TSH assessed in 01-2012 and was normal.   Prior to January, she was very independent caring for other family members, going grocery shopping, and cleaning her house. Since she has had palpitations and a sensation of an irregular heart beat, her energy level has declined significantly on her bad days.  EP has been asked to evaluate for treatment options.  ROS is negative except as outlined above.   Past Medical History  Diagnosis Date  . Atrial fibrillation   . Diabetes mellitus without complication   . Neuropathy   . Diverticulitis      Surgical History:  Past Surgical History  Procedure Laterality Date  . Cholecystectomy       Prescriptions prior to admission  Medication Sig Dispense Refill  . acetaminophen (TYLENOL) 500 MG tablet Take 500 mg by mouth daily as needed for moderate pain.      . cholecalciferol (VITAMIN D) 1000 UNITS tablet Take 5,000 Units by mouth daily.      . clonazePAM (KLONOPIN) 0.5 MG tablet Take 0.5 mg by mouth 2 (two) times daily as needed for anxiety.      Marland Kitchen estradiol (ESTRACE) 1 MG tablet  Take 1 mg by mouth daily.      Marland Kitchen exenatide (BYETTA 5 MCG PEN) 5 MCG/0.02ML SOPN injection Inject 5 mcg into the skin 2 (two) times daily with a meal.      . glipiZIDE (GLUCOTROL XL) 10 MG 24 hr tablet Take 10 mg by mouth daily with breakfast.      . hydrochlorothiazide (MICROZIDE) 12.5 MG capsule Take 12.5 mg by mouth daily.      . lansoprazole (PREVACID) 30 MG capsule Take 30 mg by mouth 2 (two) times daily before a meal.      . levothyroxine (SYNTHROID, LEVOTHROID) 137 MCG tablet Take 137 mcg by mouth daily before  breakfast.      . metFORMIN (GLUCOPHAGE-XR) 500 MG 24 hr tablet Take 500 mg by mouth 2 (two) times daily.      . metoprolol tartrate (LOPRESSOR) 25 MG tablet Take 25 mg by mouth 2 (two) times daily.      . promethazine (PHENERGAN) 25 MG tablet Take 25 mg by mouth every 6 (six) hours as needed for nausea or vomiting.      . Rivaroxaban (XARELTO) 20 MG TABS tablet Take 20 mg by mouth daily with supper.      . simvastatin (ZOCOR) 40 MG tablet Take 40 mg by mouth every evening.      . traMADol (ULTRAM) 50 MG tablet Take 100 mg by mouth every 6 (six) hours as needed for moderate pain.        Inpatient Medications:  . aspirin EC  81 mg Oral Daily  . cholecalciferol  5,000 Units Oral Daily  . estradiol  1 mg Oral Daily  . hydrochlorothiazide  12.5 mg Oral Daily  . insulin aspart  0-5 Units Subcutaneous QHS  . insulin aspart  0-9 Units Subcutaneous TID WC  . levothyroxine  137 mcg Oral QAC breakfast  . metoprolol tartrate  25 mg Oral BID  . pantoprazole  20 mg Oral Daily  . potassium chloride  40 mEq Oral Once  . [START ON 01/11/2013] Rivaroxaban  20 mg Oral Q supper  . Rivaroxaban  20 mg Oral NOW  . simvastatin  40 mg Oral QPM  . sodium chloride  3 mL Intravenous Q12H  . sodium chloride  3 mL Intravenous Q12H    Allergies:  Allergies  Allergen Reactions  . Avelox [Moxifloxacin Hcl In Nacl]   . Cefprozil   . Cetacaine [Butamben-Tetracaine-Benzocaine]   . Dicyclomine   . Diltiazem   . Hydrocodone   . Imdur [Isosorbide]   . Januvia [Sitagliptin]   . Lipitor [Atorvastatin]   . Losartan   . Nitroglycerin     Made patient go into cardiac arrest  . Oxycodone   . Penicillins   . Prednisone   . Vancomycin     History   Social History  . Marital Status: Widowed    Spouse Name: N/A    Number of Children: N/A  . Years of Education: N/A   Occupational History  . Not on file.   Social History Main Topics  . Smoking status: Never Smoker   . Smokeless tobacco: Not on file  .  Alcohol Use: No  . Drug Use: No  . Sexual Activity: No   Other Topics Concern  . Not on file   Social History Narrative  . No narrative on file     History reviewed. No pertinent family history.   Physical Exam: Filed Vitals:   01/09/13 2142 01/10/13 0410 01/10/13 [id number] 01/10/13  1419  BP: 91/36 93/58 110/74 111/64  Pulse:  66 69 68  Temp:  98.1 F (36.7 C)  98.4 F (36.9 C)  TempSrc:  Oral  Oral  Resp:  18  18  Height:      Weight:      SpO2:  91%  93%    GEN- The patient is oberse appearing, alert and oriented x 3 today.   Head- normocephalic, atraumatic Eyes-  Sclera clear, conjunctiva pink Ears- hearing intact Oropharynx- clear Neck- supple, no JVP, carotid massage manuever normal Lungs- Clear to ausculation bilaterally, normal work of breathing Heart- Regular rate and rhythm, no murmurs, rubs or gallops, PMI not laterally displaced GI- soft, NT, ND, + BS Extremities- no clubbing, cyanosis, or edema MS- no significant deformity or atrophy Skin- no rash or lesion Psych- euthymic mood, full affect Neuro- strength and sensation are intact   Labs:   Lab Results  Component Value Date   WBC 9.8 01/09/2013   HGB 11.2* 01/09/2013   HCT 35.6* 01/09/2013   MCV 88.8 01/09/2013   PLT 228 01/09/2013    Recent Labs Lab 01/09/13 2119  NA 137  K 3.7  CL 96  CO2 28  BUN 11  CREATININE 0.85  CALCIUM 9.4  PROT 7.0  BILITOT 0.3  ALKPHOS 51  ALT 12  AST 27  GLUCOSE 170*   Lab Results  Component Value Date   TROPONINI <0.30 01/09/2013    Radiology/Studies: Dg Chest Port 1 View 01/08/2013   CLINICAL DATA:  Shortness of breath  EXAM: PORTABLE CHEST - 1 VIEW  COMPARISON:  None.  FINDINGS: The heart size and mediastinal contours are within normal limits. Both lungs are clear. The visualized skeletal structures are unremarkable.  IMPRESSION: No active disease.   Electronically Signed   By: Inez Catalina M.D.   On: 01/08/2013 14:37    EKG: sinus rhythm, 1st degree  AV block, otherwise normal  TELEMETRY: sinus, no arrhythmias  A/P 1. Syncope The patient has recurrent unexplained syncope. Her EKG is benign and EF by cath is normal.  She has a h/o afib but has been in sinus rhythm here.  She is appropriately anticoagulated with xarelto.  She has previously tried wearing an event monitor and could not tolerate this due to skin irritation. I think that the most appropriate strategy at this time is to place an implantable loop recorder for evaluation of recurrent syncope and also for afib management.  Risks, benefits, and alternatives to implantable loop recorder placement were discussed at length with the patient and her family who wish to proceed.  We will place implantable loop recorder later today.  Carotid dopplers and echo are pending. I have informed the patient of DMV driving restriction.  She is aware that she cannot drive for 6 months.

## 2013-01-11 ENCOUNTER — Encounter (HOSPITAL_COMMUNITY): Payer: Self-pay | Admitting: *Deleted

## 2013-01-11 DIAGNOSIS — E1169 Type 2 diabetes mellitus with other specified complication: Secondary | ICD-10-CM

## 2013-01-11 DIAGNOSIS — I059 Rheumatic mitral valve disease, unspecified: Secondary | ICD-10-CM

## 2013-01-11 DIAGNOSIS — I10 Essential (primary) hypertension: Secondary | ICD-10-CM | POA: Diagnosis present

## 2013-01-11 DIAGNOSIS — R55 Syncope and collapse: Secondary | ICD-10-CM

## 2013-01-11 DIAGNOSIS — I480 Paroxysmal atrial fibrillation: Secondary | ICD-10-CM

## 2013-01-11 DIAGNOSIS — I2510 Atherosclerotic heart disease of native coronary artery without angina pectoris: Secondary | ICD-10-CM

## 2013-01-11 DIAGNOSIS — E1122 Type 2 diabetes mellitus with diabetic chronic kidney disease: Secondary | ICD-10-CM | POA: Diagnosis present

## 2013-01-11 DIAGNOSIS — R778 Other specified abnormalities of plasma proteins: Secondary | ICD-10-CM

## 2013-01-11 DIAGNOSIS — E785 Hyperlipidemia, unspecified: Secondary | ICD-10-CM

## 2013-01-11 HISTORY — DX: Type 2 diabetes mellitus with other specified complication: E11.69

## 2013-01-11 LAB — GLUCOSE, CAPILLARY
Glucose-Capillary: 222 mg/dL — ABNORMAL HIGH (ref 70–99)
Glucose-Capillary: 273 mg/dL — ABNORMAL HIGH (ref 70–99)

## 2013-01-11 MED ORDER — ASPIRIN 81 MG PO TBEC: 81.0000 mg | DELAYED_RELEASE_TABLET | Freq: Every day | ORAL | Status: DC

## 2013-01-11 NOTE — Progress Notes (Signed)
Patient discontinued from telemetry, IV removal tolerated well.  Patient and RN reviewed AVS summary, patient states understanding and signed patient summary.  One copy with patient, another documented in chart.Transported to lobby per Engineer, manufacturing.

## 2013-01-11 NOTE — Progress Notes (Signed)
*  PRELIMINARY RESULTS* Vascular Ultrasound Carotid Duplex (Doppler) has been completed.  Preliminary findings: Bilateral:  1-39% ICA stenosis.  Vertebral artery flow is antegrade.      Landry Mellow, RDMS, RVT  01/11/2013, 10:11 AM

## 2013-01-11 NOTE — Progress Notes (Signed)
Subjective: The patient still had chest pain, improved from yesterday.    Marland Kitchen aspirin EC  81 mg Oral Daily  . cholecalciferol  5,000 Units Oral Daily  . estradiol  1 mg Oral Daily  . hydrochlorothiazide  12.5 mg Oral Daily  . insulin aspart  0-5 Units Subcutaneous QHS  . insulin aspart  0-9 Units Subcutaneous TID WC  . levothyroxine  137 mcg Oral QAC breakfast  . metoprolol tartrate  25 mg Oral BID  . pantoprazole  20 mg Oral Daily  . potassium chloride  40 mEq Oral Once  . Rivaroxaban  20 mg Oral Q supper  . simvastatin  40 mg Oral QPM  . sodium chloride  3 mL Intravenous Q12H   Objective: Filed Vitals:   01/10/13 1814 01/10/13 2113 01/10/13 2158 01/11/13 0532  BP: 117/54 110/58 116/46 116/73  Pulse: 64 65 66 59  Temp:   99.2 F (37.3 C) 97.9 F (36.6 C)  TempSrc:   Oral Oral  Resp:   18 18  Height:      Weight:      SpO2:   95% 93%   Weight change:   Intake/Output Summary (Last 24 hours) at 01/11/13 1004 Last data filed at 01/11/13 0900  Gross per 24 hour  Intake    600 ml  Output      0 ml  Net    600 ml   General: Alert, awake, oriented x3, in no acute distress Neck:  JVP is normal Heart: Regular rate and rhythm, without murmurs, rubs, gallops.  Lungs: Clear to auscultation.  No rales or wheezes. Exemities:  No edema.   Neuro: Grossly intact, nonfocal.  Tele:   SR  Lab Results: Results for orders placed during the hospital encounter of 01/08/13 (from the past 24 hour(s))  GLUCOSE, CAPILLARY     Status: Abnormal   Collection Time    01/10/13 11:51 AM      Result Value Range   Glucose-Capillary 206 (*) 70 - 99 mg/dL  GLUCOSE, CAPILLARY     Status: Abnormal   Collection Time    01/10/13  4:23 PM      Result Value Range   Glucose-Capillary 236 (*) 70 - 99 mg/dL  GLUCOSE, CAPILLARY     Status: Abnormal   Collection Time    01/10/13  5:49 PM      Result Value Range   Glucose-Capillary 189 (*) 70 - 99 mg/dL  GLUCOSE, CAPILLARY     Status: Abnormal   Collection Time    01/10/13  9:52 PM      Result Value Range   Glucose-Capillary 190 (*) 70 - 99 mg/dL   Comment 1 Documented in Chart     Comment 2 Notify RN    GLUCOSE, CAPILLARY     Status: Abnormal   Collection Time    01/11/13  6:39 AM      Result Value Range   Glucose-Capillary 222 (*) 70 - 99 mg/dL    Cath 01/09/13 Cath:Coronary angiography:  Coronary dominance: right  Left mainstem: Patent without obstructive disease. Mild calcification.  Left anterior descending (LAD): Heavy calcification. 50% proximal and 50% mid-vessel stenosis. Large first diagonal without stenosis.  Left circumflex (LCx): normal caliber vessel. Intermediate branch has no stenosis. Mid-AV groove circumflex has a 50-60% eccentric stenosis.  Right coronary artery (RCA): Heavy calcium in the right cusp. The vessel is widely patent with minimal irregularity and patency of the PDA and PLA branches. Right dominant  vessel.  Left ventriculography: Left ventricular systolic function is normal, LVEF is estimated at 55-65%, there is no significant mitral regurgitation  Final Conclusions:  1. Heavily calcified but nonobstructive CAD with negative pressure wire analysis of the left circumflex.  2. Normal LV function  Recommendations: med Rx for nonobstructive CAD.   Assessment and Plan:  1.  Chest pain  Patient still has pain that feels like indigestion. Cath yesterday showed heavily calcified but nonobstructive CAD with negative pressure wire analysis of the left circumflex, we will continue medical therapy. Echocardiogram shows restrictive filling pattern, we will add Lasix 40 mg PO daily.   2.  SOB  Seen by K Clance. PFT show minimal restriction prob related to wt.  Also with deconditioning.    2.  PAF  Patient remains in [place]. She possibly had rapid afb that lead to syncope last week.  Cath is negative. She underwent a successful implantation of a Medtronic Reveal LINQ implantable loop recorder for cryptogenic  stroke yesterday. Oral anticoag - restart Xarelto today. Sono carotids is normal.  3.  DM  Holding agents   4.  HL - Continue simvastatin.    The patient can be discharged today with an early follow up in our clinic.   LOS: 3 days   Ena Dawley, H 01/11/2013, 10:04 AM

## 2013-01-11 NOTE — Progress Notes (Signed)
  Echocardiogram 2D Echocardiogram has been performed.  [name] 01/11/2013, 9:11 AM

## 2013-01-11 NOTE — Discharge Summary (Signed)
[name], [name] 01/11/2013

## 2013-01-11 NOTE — Discharge Summary (Signed)
Discharge Summary   Patient ID: [name],  MRN: [id number], DOB/AGE: 70-Sep-1942 70 y.o.  Admit date: 01/08/2013 Discharge date: 01/11/2013  Primary Care Provider:  Ferd Hibbs, MD  Primary Cardiologist: P. [name], MD   Discharge Diagnoses Principal Problem:   Midsternal chest pain  **Nonobstructive CAD by catheterization this admission. Active Problems:   Syncope  **EF 45-50% by echocardiogram this admission.  **s/p Medtronic Reveal LINQ implantable loop recorder this admission.    Elevated troponin  **Initial troponin 1.35, all others wnl.   CAD (coronary artery disease)  **Nonobstructive CAD by catheterization this admission.   HTN (hypertension)   Hypokalemia   DM neuropathy, type II diabetes mellitus   PAF (paroxysmal atrial fibrillation)  **Chronic Xarelto therapy.   Hyperlipidemia  Allergies Allergies  Allergen Reactions  . Avelox [Moxifloxacin Hcl In Nacl]   . Cefprozil   . Cetacaine [Butamben-Tetracaine-Benzocaine]   . Dicyclomine   . Diltiazem   . Hydrocodone   . Imdur [Isosorbide]   . Januvia [Sitagliptin]   . Lipitor [Atorvastatin]   . Losartan   . Nitroglycerin     Made patient go into cardiac arrest  . Oxycodone   . Penicillins   . Prednisone   . Vancomycin    Procedures  Cardiac Catheterization with Fractional Flow Reserve measurement 12.15.2014  Procedural Findings: Hemodynamics: AO 103/56 LV 105/14  Coronary angiography: Coronary dominance: right  Left mainstem: Patent without obstructive disease. Mild calcification. Left anterior descending (LAD): Heavy calcification. 50% proximal and 50% mid-vessel stenosis. Large first diagonal without stenosis.   Left circumflex (LCx): normal caliber vessel. Intermediate branch has no stenosis. Mid-AV groove circumflex has a 50-60% eccentric stenosis (FFR 0.93). Right coronary artery (RCA): Heavy calcium in the right cusp. The vessel is widely patent with minimal irregularity and patency of the  PDA and PLA branches. Right dominant vessel. Left ventriculography: Left ventricular systolic function is normal, LVEF is estimated at 55-65%, there is no significant mitral regurgitation   Final Conclusions:   1. Heavily calcified but nonobstructive CAD with negative pressure wire analysis of the left circumflex.   2. Normal LV function  Recommendations: med Rx for nonobstructive CAD. _____________  2D Echocardiogram 12.15.2014   Study Conclusions  - Left ventricle: Basal and mid inferolateral and basal   inferior walls are hypokinetic. The cavity size was   normal. Wall thickness was normal. Systolic function was   mildly reduced. The estimated ejection fraction was in the   range of 45% to 50%. Doppler parameters are consistent   with a restrictive pattern, indicative of decreased left   ventricular diastolic compliance and/or increased left   atrial pressure (grade 3 diastolic dysfunction). Doppler   parameters are consistent with elevated ventricular   end-diastolic filling pressure. - Aortic valve: Trileaflet; mildly thickened, mildly   calcified leaflets. There was no stenosis. - Mitral valve: Mildly thickened leaflets . Mild   regurgitation. - Left atrium: The atrium was mildly dilated. - Right ventricle: Systolic pressure was within the normal   range. - Atrial septum: No defect or patent foramen ovale was   identified. - Tricuspid valve: No significant regurgitation. _____________   Carotid Ultrasound 12.16.2014  Carotid Duplex (Doppler) has been completed.  Preliminary findings: Bilateral:  1-39% ICA stenosis.  Vertebral artery flow is antegrade.   _____________   Implantable Loop Recorder Insertion 12.16.2014  CONCLUSIONS:   1. Successful implantation of a Medtronic Reveal LINQ implantable loop recorder for unexplained syncope.  2. No early apparent complications.  _____________  History of Present Illness  72 year old female with prior history of  paroxysmal atrial fibrillation diagnosed in January 2014 at which point she was placed on oral anticoagulation therapy with Xarelto. Over the course of the year, patient has been experiencing intermittent chest discomfort and dyspnea on exertion and in July of 2014, she underwent right and left heart cardiac catheterization revealing nonobstructive coronary artery disease, normal LV function, and mild pulmonary hypertension. Unfortunately, she continued to experience dyspnea exertion and intermittent chest discomfort.  Approximately one week prior to admission, patient reported having a fall while in the ER it. Initially, she reported that this was possibly a mechanical fall. Approximately 3-4 days prior to admission, she began to experience generalized malaise, weakness, nausea, vomiting, cough, and worsening of her chronic dyspnea to the point that she felt dyspneic at rest. Because of progression of symptoms, she presented to the [facility] on December 14. There, initial troponin was elevated at 1.35, while ECG showed no acute changes. Patient was admitted for further evaluation and management of presumed non-ST segment elevation myocardial infarction.  Hospital Course  Following admission, patient continued to have mild pleuritic chest discomfort. All subsequent troponins were normal. Regardless, because of progression of symptoms, decision was made to pursue diagnostic catheterization. This took place on December 15, and again revealed nonobstructive coronary artery disease. There was a 50-60% moderate stenosis in the left circumflex and fractional flow reserve was measured and found to be normal at 0.93. As a result, continue medical therapy was recommended. A 2-D echocardiogram was also completed, showing an EF of 45-50% with grade 3 diastolic dysfunction and without significant valvular abnormalities.  In the absence of an identifiable cause for her chest discomfort and dyspnea, concern rose back  she could potentially be experiencing paroxysms of atrial fibrillation. This, in combination with a recent fall, which upon further questioning was determined to be related to syncope as opposed to being a mechanical fall, warranted electrophysiological evaluation. In the past, patient has worn an event monitor however this was limited by an allergic response to monitoring pads. Thus it was felt that she would benefit from placement of an implantable loop recorder. This was successfully carried out on December 16. A carotid ultrasound was also performed and showed 1-39% bilateral internal carotid artery stenosis.  Post Loop recorder, her home dose of Xarelto was resumed and she has been ambulating without difficulty. She will be discharged home today in good condition and we've arranged followup in clinic in approximately 2 weeks.  Discharge Vitals Blood pressure 118/76, pulse 61, temperature 97.9 F (36.6 C), temperature source Oral, resp. rate 18, height 5\' 1"  (1.549 m), weight 195 lb (88.451 kg), SpO2 93.00%.  Filed Weights   01/08/13 1722  Weight: 195 lb (88.451 kg)   Labs  CBC  Recent Labs  01/08/13 1405 01/09/13 0510  WBC 11.5* 9.8  HGB 12.6 11.2*  HCT 38.5 35.6*  MCV 86.1 88.8  PLT 283 [id number]   Basic Metabolic Panel  Recent Labs  01/09/13 1105 01/09/13 2119  NA 137 137  K 3.7 3.7  CL 95* 96  CO2 25 28  GLUCOSE 280* 170*  BUN 14 11  CREATININE 0.81 0.85  CALCIUM 9.1 9.4   Liver Function Tests  Recent Labs  01/09/13 2119  AST 27  ALT 12  ALKPHOS 51  BILITOT 0.3  PROT 7.0  ALBUMIN 3.4*   Cardiac Enzymes  Recent Labs  01/08/13 2250 01/09/13 0245 01/09/13 0933  TROPONINI <0.30 <0.30 <  0.30   Hemoglobin A1C  Recent Labs  01/09/13 0510  HGBA1C 7.2*   Disposition  Pt is being discharged home today in good condition.  Follow-up Plans & Appointments  Follow-up Information   Follow up with Ileene Hutchinson, PA-C On 02/08/2013. (12:30 PM - Dr. Jackalyn Lombard  PA)    Specialty:  Cardiology   Contact information:   [place] [address] [phone]       Follow up with Truitt Merle, NP On 01/24/2013. (9:00 AM - Dr. Doug Sou Nurse Practitioner)    Specialty:  Nurse Practitioner   Contact information:   [address]   [address] [phone]      Discharge Medications    Medication List         acetaminophen 500 MG tablet  Commonly known as:  TYLENOL  Take 500 mg by mouth daily as needed for moderate pain.     aspirin 81 MG EC tablet  Take 1 tablet (81 mg total) by mouth daily.     BYETTA 5 MCG PEN 5 MCG/0.02ML Sopn injection  Generic drug:  exenatide  Inject 5 mcg into the skin 2 (two) times daily with a meal.     cholecalciferol 1000 UNITS tablet  Commonly known as:  VITAMIN D  Take 5,000 Units by mouth daily.     clonazePAM 0.5 MG tablet  Commonly known as:  KLONOPIN  Take 0.5 mg by mouth 2 (two) times daily as needed for anxiety.     estradiol 1 MG tablet  Commonly known as:  ESTRACE  Take 1 mg by mouth daily.     glipiZIDE 10 MG 24 hr tablet  Commonly known as:  GLUCOTROL XL  Take 10 mg by mouth daily with breakfast.     hydrochlorothiazide 12.5 MG capsule  Commonly known as:  MICROZIDE  Take 12.5 mg by mouth daily.     lansoprazole 30 MG capsule  Commonly known as:  PREVACID  Take 30 mg by mouth 2 (two) times daily before a meal.     levothyroxine 137 MCG tablet  Commonly known as:  SYNTHROID, LEVOTHROID  Take 137 mcg by mouth daily before breakfast.     metFORMIN 500 MG 24 hr tablet  Commonly known as:  GLUCOPHAGE-XR  Take 500 mg by mouth 2 (two) times daily.     metoprolol tartrate 25 MG tablet  Commonly known as:  LOPRESSOR  Take 25 mg by mouth 2 (two) times daily.     promethazine 25 MG tablet  Commonly known as:  PHENERGAN  Take 25 mg by mouth every 6 (six) hours as needed for nausea or vomiting.     simvastatin 40 MG tablet  Commonly  known as:  ZOCOR  Take 40 mg by mouth every evening.     traMADol 50 MG tablet  Commonly known as:  ULTRAM  Take 100 mg by mouth every 6 (six) hours as needed for moderate pain.     XARELTO 20 MG Tabs tablet  Generic drug:  Rivaroxaban  Take 20 mg by mouth daily with supper.       Outstanding Labs/Studies  None  Duration of Discharge Encounter   Greater than 30 minutes including physician time.  Signed, Murray Hodgkins NP 01/11/2013, 12:52 PM

## 2013-01-11 NOTE — Progress Notes (Signed)
Subjective: The patient still had chest pain, improved from yesterday.    Marland Kitchen aspirin EC  81 mg Oral Daily  . cholecalciferol  5,000 Units Oral Daily  . estradiol  1 mg Oral Daily  . hydrochlorothiazide  12.5 mg Oral Daily  . insulin aspart  0-5 Units Subcutaneous QHS  . insulin aspart  0-9 Units Subcutaneous TID WC  . levothyroxine  137 mcg Oral QAC breakfast  . metoprolol tartrate  25 mg Oral BID  . pantoprazole  20 mg Oral Daily  . potassium chloride  40 mEq Oral Once  . Rivaroxaban  20 mg Oral Q supper  . simvastatin  40 mg Oral QPM  . sodium chloride  3 mL Intravenous Q12H   Objective: Filed Vitals:   01/10/13 2113 01/10/13 2158 01/11/13 0532 01/11/13 1047  BP: 110/58 116/46 116/73 118/76  Pulse: 65 66 59 61  Temp:  99.2 F (37.3 C) 97.9 F (36.6 C)   TempSrc:  Oral Oral   Resp:  18 18 18   Height:      Weight:      SpO2:  95% 93%    Weight change:   Intake/Output Summary (Last 24 hours) at 01/11/13 1101 Last data filed at 01/11/13 0900  Gross per 24 hour  Intake    600 ml  Output      0 ml  Net    600 ml   General: Alert, awake, oriented x3, in no acute distress Neck:  JVP is normal Heart: Regular rate and rhythm, without murmurs, rubs, gallops.  Lungs: Clear to auscultation.  No rales or wheezes. Exemities:  No edema.   Neuro: Grossly intact, nonfocal.  Tele:   SR  Lab Results: Results for orders placed during the hospital encounter of 01/08/13 (from the past 24 hour(s))  GLUCOSE, CAPILLARY     Status: Abnormal   Collection Time    01/10/13 11:51 AM      Result Value Range   Glucose-Capillary 206 (*) 70 - 99 mg/dL  GLUCOSE, CAPILLARY     Status: Abnormal   Collection Time    01/10/13  4:23 PM      Result Value Range   Glucose-Capillary 236 (*) 70 - 99 mg/dL  GLUCOSE, CAPILLARY     Status: Abnormal   Collection Time    01/10/13  5:49 PM      Result Value Range   Glucose-Capillary 189 (*) 70 - 99 mg/dL  GLUCOSE, CAPILLARY     Status: Abnormal    Collection Time    01/10/13  9:52 PM      Result Value Range   Glucose-Capillary 190 (*) 70 - 99 mg/dL   Comment 1 Documented in Chart     Comment 2 Notify RN    GLUCOSE, CAPILLARY     Status: Abnormal   Collection Time    01/11/13  6:39 AM      Result Value Range   Glucose-Capillary 222 (*) 70 - 99 mg/dL    Cath 01/09/13 Cath:Coronary angiography:  Coronary dominance: right  Left mainstem: Patent without obstructive disease. Mild calcification.  Left anterior descending (LAD): Heavy calcification. 50% proximal and 50% mid-vessel stenosis. Large first diagonal without stenosis.  Left circumflex (LCx): normal caliber vessel. Intermediate branch has no stenosis. Mid-AV groove circumflex has a 50-60% eccentric stenosis.  Right coronary artery (RCA): Heavy calcium in the right cusp. The vessel is widely patent with minimal irregularity and patency of the PDA and PLA branches. Right  dominant vessel.  Left ventriculography: Left ventricular systolic function is normal, LVEF is estimated at 55-65%, there is no significant mitral regurgitation  Final Conclusions:  1. Heavily calcified but nonobstructive CAD with negative pressure wire analysis of the left circumflex.  2. Normal LV function  Recommendations: med Rx for nonobstructive CAD.   Assessment and Plan:  1.  Chest pain  Patient still has pain that feels like indigestion. Cath yesterday showed heavily calcified but nonobstructive CAD with negative pressure wire analysis of the left circumflex, we will continue medical therapy. Echocardiogram is pending.  2.  SOB  Seen by K Clance. PFT show minimal restriction prob related to wt.  Also with deconditioning.    2.  PAF  Patient remains in [place]. She possibly had rapid afb that lead to syncope last week.  Cath is negative, so we will consider loop or link to evaluate, off heparin. Oral anticoag - restart Xarelto today. Sono carotids is pending.    3.  DM  Holding agents   4.  HL   Continue simvastatin.     LOS: 3 days   Ena Dawley, H 01/11/2013, 11:01 AM

## 2013-01-12 ENCOUNTER — Encounter: Payer: Self-pay | Admitting: Pulmonary Disease

## 2013-01-23 ENCOUNTER — Encounter: Payer: Self-pay | Admitting: Pulmonary Disease

## 2013-01-24 ENCOUNTER — Encounter: Payer: Medicare HMO | Admitting: Nurse Practitioner

## 2013-01-24 ENCOUNTER — Ambulatory Visit (INDEPENDENT_AMBULATORY_CARE_PROVIDER_SITE_OTHER): Payer: Medicare HMO | Admitting: Cardiology

## 2013-01-24 ENCOUNTER — Encounter: Payer: Medicare HMO | Admitting: Cardiology

## 2013-01-24 ENCOUNTER — Encounter: Payer: Self-pay | Admitting: Internal Medicine

## 2013-01-24 ENCOUNTER — Encounter: Payer: Self-pay | Admitting: Cardiology

## 2013-01-24 VITALS — BP 144/76 | HR 68 | Ht 61.0 in | Wt 199.0 lb

## 2013-01-24 DIAGNOSIS — I4891 Unspecified atrial fibrillation: Secondary | ICD-10-CM

## 2013-01-24 DIAGNOSIS — R079 Chest pain, unspecified: Secondary | ICD-10-CM

## 2013-01-24 DIAGNOSIS — R0609 Other forms of dyspnea: Secondary | ICD-10-CM

## 2013-01-24 DIAGNOSIS — R55 Syncope and collapse: Secondary | ICD-10-CM

## 2013-01-24 DIAGNOSIS — I471 Supraventricular tachycardia, unspecified: Secondary | ICD-10-CM

## 2013-01-24 DIAGNOSIS — Z95818 Presence of other cardiac implants and grafts: Secondary | ICD-10-CM

## 2013-01-24 DIAGNOSIS — R0600 Dyspnea, unspecified: Secondary | ICD-10-CM

## 2013-01-24 DIAGNOSIS — Z4509 Encounter for adjustment and management of other cardiac device: Secondary | ICD-10-CM
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
## 2013-01-25 ENCOUNTER — Ambulatory Visit: Payer: Medicare HMO

## 2013-01-27 LAB — MDC_IDC_ENUM_SESS_TYPE_INCLINIC
Date Time Interrogation Session: 20141231215703
Zone Setting Detection Interval: 2000 ms
Zone Setting Detection Interval: 3000 ms
Zone Setting Detection Interval: 380 ms

## 2013-02-01 ENCOUNTER — Ambulatory Visit: Payer: Medicare HMO | Admitting: Cardiology

## 2013-02-06 ENCOUNTER — Telehealth: Payer: Self-pay | Admitting: *Deleted

## 2013-02-06 NOTE — Telephone Encounter (Signed)
Called patient to have her increase her Metoprolol to 50mg  bid due to afib w high V rates on her LINQ.  No answer

## 2013-02-07 MED ORDER — METOPROLOL TARTRATE 50 MG PO TABS: 50.0000 mg | ORAL_TABLET | Freq: Two times a day (BID) | ORAL | Status: DC

## 2013-02-07 NOTE — Telephone Encounter (Signed)
Spoke with the patient aware and will keep follow up for next week

## 2013-02-08 ENCOUNTER — Encounter: Payer: Medicare HMO | Admitting: Cardiology

## 2013-02-08 ENCOUNTER — Ambulatory Visit: Payer: Medicare HMO | Admitting: Cardiology

## 2013-02-13 ENCOUNTER — Encounter: Payer: Self-pay | Admitting: Internal Medicine

## 2013-02-15 ENCOUNTER — Encounter: Payer: Self-pay | Admitting: Internal Medicine

## 2013-02-15 ENCOUNTER — Encounter: Payer: Self-pay | Admitting: Cardiology

## 2013-02-15 ENCOUNTER — Ambulatory Visit (INDEPENDENT_AMBULATORY_CARE_PROVIDER_SITE_OTHER): Payer: Medicare HMO | Admitting: Cardiology

## 2013-02-15 VITALS — BP 125/71 | HR 64 | Ht 61.0 in | Wt 198.0 lb

## 2013-02-15 DIAGNOSIS — R55 Syncope and collapse: Secondary | ICD-10-CM

## 2013-02-15 DIAGNOSIS — I4891 Unspecified atrial fibrillation: Secondary | ICD-10-CM

## 2013-02-15 DIAGNOSIS — Z4509 Encounter for adjustment and management of other cardiac device: Secondary | ICD-10-CM

## 2013-02-15 NOTE — Patient Instructions (Signed)
Your physician recommends that you schedule a follow-up appointment in: 3 MONTHS WITH DR. [place]  Your physician recommends that you continue on your current medications as directed. Please refer to the Current Medication list given to you today.

## 2013-02-20 ENCOUNTER — Telehealth: Payer: Self-pay | Admitting: *Deleted

## 2013-02-20 MED ORDER — METOPROLOL TARTRATE 50 MG PO TABS: 75.0000 mg | ORAL_TABLET | Freq: Two times a day (BID) | ORAL | Status: DC

## 2013-02-20 NOTE — Telephone Encounter (Signed)
Spoke with patient and let her know to increase her Metoprolol to 1 1/2 tablets twice daily (75mg  bid)

## 2013-02-21 ENCOUNTER — Telehealth: Payer: Self-pay | Admitting: Cardiology

## 2013-02-21 NOTE — Telephone Encounter (Signed)
Returned call to patient, she stated Dr.Cynthia Melina Copa prescribed Bydureon 2 mg injection weekly yesterday to help lower blood sugar.Stated she read the side effects and wanted to ask Dr.Jordan if ok for her to take.Message sent to [place] for advice.

## 2013-02-21 NOTE — Telephone Encounter (Signed)
New Message  Pt called states that she was advised by Dr. Legrand Como Altheimer and Dr.Cynthia Melina Copa PCP prescribed Bydureon 2 mg and the pt is concerned about the side effects. She is requesting a call back to determine if this medication is safe to take.Marland Kitchen Please call.

## 2013-02-21 NOTE — Telephone Encounter (Signed)
I think it is safe to take. Needs to let her doctors know if she develops any side effects.  Peter [name] MD, [facility]

## 2013-02-21 NOTE — Telephone Encounter (Signed)
Spoke to patient Dr.Jordan advised safe to take Bydureon.Advised to call PCP if develops any side effects.

## 2013-02-21 NOTE — Telephone Encounter (Signed)
Patient called no answer.No answering machine unable to leave message.
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
## 2013-02-27 ENCOUNTER — Encounter: Payer: Self-pay | Admitting: Cardiology

## 2013-02-27 ENCOUNTER — Encounter: Payer: Self-pay | Admitting: Internal Medicine

## 2013-02-27 ENCOUNTER — Ambulatory Visit (INDEPENDENT_AMBULATORY_CARE_PROVIDER_SITE_OTHER): Payer: Medicare HMO | Admitting: Cardiology

## 2013-02-27 ENCOUNTER — Encounter (INDEPENDENT_AMBULATORY_CARE_PROVIDER_SITE_OTHER): Payer: Self-pay

## 2013-02-27 VITALS — BP 128/68 | HR 67 | Ht 61.0 in | Wt 200.1 lb

## 2013-02-27 DIAGNOSIS — E1140 Type 2 diabetes mellitus with diabetic neuropathy, unspecified: Secondary | ICD-10-CM

## 2013-02-27 DIAGNOSIS — I480 Paroxysmal atrial fibrillation: Secondary | ICD-10-CM

## 2013-02-27 DIAGNOSIS — I2510 Atherosclerotic heart disease of native coronary artery without angina pectoris: Secondary | ICD-10-CM

## 2013-02-27 DIAGNOSIS — R002 Palpitations: Secondary | ICD-10-CM

## 2013-02-27 DIAGNOSIS — E1149 Type 2 diabetes mellitus with other diabetic neurological complication: Secondary | ICD-10-CM

## 2013-02-27 DIAGNOSIS — R55 Syncope and collapse: Secondary | ICD-10-CM

## 2013-02-27 DIAGNOSIS — I4891 Unspecified atrial fibrillation: Secondary | ICD-10-CM
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
## 2013-02-27 NOTE — Patient Instructions (Signed)
Continue your current therapy  I will see you in 6 months.   

## 2013-03-20 ENCOUNTER — Encounter: Payer: Self-pay | Admitting: Internal Medicine

## 2013-03-20 ENCOUNTER — Ambulatory Visit (INDEPENDENT_AMBULATORY_CARE_PROVIDER_SITE_OTHER): Payer: Medicare HMO | Admitting: *Deleted

## 2013-03-20 DIAGNOSIS — R55 Syncope and collapse: Secondary | ICD-10-CM

## 2013-03-29 ENCOUNTER — Telehealth: Payer: Self-pay | Admitting: *Deleted

## 2013-03-29 MED ORDER — METOPROLOL TARTRATE 100 MG PO TABS: 100.0000 mg | ORAL_TABLET | Freq: Two times a day (BID) | ORAL | Status: DC

## 2013-03-29 NOTE — Telephone Encounter (Signed)
Per Dr Rayann Heman increase Metoprolol top 100mg  bid due to [facility] report still showing increased HR's.  Patient is aware

## 2013-04-18 DIAGNOSIS — R55 Syncope and collapse: Secondary | ICD-10-CM

## 2013-04-18 LAB — MDC_IDC_ENUM_SESS_TYPE_REMOTE

## 2013-04-21 ENCOUNTER — Ambulatory Visit (INDEPENDENT_AMBULATORY_CARE_PROVIDER_SITE_OTHER): Payer: Medicare HMO | Admitting: *Deleted

## 2013-04-21 ENCOUNTER — Ambulatory Visit: Payer: Medicare HMO | Admitting: *Deleted

## 2013-04-21 DIAGNOSIS — R55 Syncope and collapse: Secondary | ICD-10-CM

## 2013-04-24 LAB — MDC_IDC_ENUM_SESS_TYPE_REMOTE

## 2013-04-25 ENCOUNTER — Encounter: Payer: Self-pay | Admitting: Internal Medicine

## 2013-05-09 ENCOUNTER — Encounter: Payer: Self-pay | Admitting: Internal Medicine

## 2013-05-09 ENCOUNTER — Ambulatory Visit (INDEPENDENT_AMBULATORY_CARE_PROVIDER_SITE_OTHER): Payer: Medicare HMO | Admitting: *Deleted

## 2013-05-09 DIAGNOSIS — R55 Syncope and collapse: Secondary | ICD-10-CM

## 2013-05-11 ENCOUNTER — Encounter: Payer: Self-pay | Admitting: Internal Medicine

## 2013-05-15 ENCOUNTER — Inpatient Hospital Stay (HOSPITAL_COMMUNITY)
Admission: EM | Admit: 2013-05-15 | Discharge: 2013-05-19 | DRG: 309 | Disposition: A | Discharge status: Home / Self Care | Payer: Medicare HMO | Attending: Internal Medicine | Admitting: Internal Medicine

## 2013-05-15 ENCOUNTER — Emergency Department (HOSPITAL_COMMUNITY): Payer: Medicare HMO

## 2013-05-15 ENCOUNTER — Encounter (HOSPITAL_COMMUNITY): Payer: Self-pay | Admitting: Emergency Medicine

## 2013-05-15 DIAGNOSIS — R7989 Other specified abnormal findings of blood chemistry: Secondary | ICD-10-CM

## 2013-05-15 DIAGNOSIS — J45909 Unspecified asthma, uncomplicated: Secondary | ICD-10-CM | POA: Diagnosis present

## 2013-05-15 DIAGNOSIS — I4891 Unspecified atrial fibrillation: Principal | ICD-10-CM | POA: Diagnosis present

## 2013-05-15 DIAGNOSIS — I4892 Unspecified atrial flutter: Secondary | ICD-10-CM | POA: Diagnosis present

## 2013-05-15 DIAGNOSIS — Z79899 Other long term (current) drug therapy: Secondary | ICD-10-CM

## 2013-05-15 DIAGNOSIS — E669 Obesity, unspecified: Secondary | ICD-10-CM

## 2013-05-15 DIAGNOSIS — R0601 Orthopnea: Secondary | ICD-10-CM | POA: Diagnosis present

## 2013-05-15 DIAGNOSIS — E785 Hyperlipidemia, unspecified: Secondary | ICD-10-CM | POA: Diagnosis present

## 2013-05-15 DIAGNOSIS — I2510 Atherosclerotic heart disease of native coronary artery without angina pectoris: Secondary | ICD-10-CM | POA: Diagnosis present

## 2013-05-15 DIAGNOSIS — R55 Syncope and collapse: Secondary | ICD-10-CM | POA: Diagnosis present

## 2013-05-15 DIAGNOSIS — I200 Unstable angina: Secondary | ICD-10-CM | POA: Diagnosis present

## 2013-05-15 DIAGNOSIS — I428 Other cardiomyopathies: Secondary | ICD-10-CM | POA: Diagnosis present

## 2013-05-15 DIAGNOSIS — E039 Hypothyroidism, unspecified: Secondary | ICD-10-CM | POA: Diagnosis present

## 2013-05-15 DIAGNOSIS — E119 Type 2 diabetes mellitus without complications: Secondary | ICD-10-CM | POA: Diagnosis present

## 2013-05-15 DIAGNOSIS — M1990 Unspecified osteoarthritis, unspecified site: Secondary | ICD-10-CM | POA: Diagnosis present

## 2013-05-15 DIAGNOSIS — R61 Generalized hyperhidrosis: Secondary | ICD-10-CM | POA: Diagnosis present

## 2013-05-15 DIAGNOSIS — K219 Gastro-esophageal reflux disease without esophagitis: Secondary | ICD-10-CM | POA: Diagnosis present

## 2013-05-15 DIAGNOSIS — Z7901 Long term (current) use of anticoagulants: Secondary | ICD-10-CM

## 2013-05-15 DIAGNOSIS — I10 Essential (primary) hypertension: Secondary | ICD-10-CM

## 2013-05-15 DIAGNOSIS — R0602 Shortness of breath: Secondary | ICD-10-CM | POA: Diagnosis present

## 2013-05-15 DIAGNOSIS — R079 Chest pain, unspecified: Secondary | ICD-10-CM

## 2013-05-15 DIAGNOSIS — R778 Other specified abnormalities of plasma proteins: Secondary | ICD-10-CM

## 2013-05-15 HISTORY — DX: Other cardiomyopathies: I42.8

## 2013-05-15 HISTORY — DX: Unspecified atrial flutter: I48.92

## 2013-05-15 LAB — COMPREHENSIVE METABOLIC PANEL
ALBUMIN: 3.8 g/dL (ref 3.5–5.2)
ALK PHOS: 48 U/L (ref 39–117)
ALT: 20 U/L (ref 0–35)
AST: 32 U/L (ref 0–37)
BILIRUBIN TOTAL: 0.3 mg/dL (ref 0.3–1.2)
BUN: 20 mg/dL (ref 6–23)
CHLORIDE: 96 meq/L (ref 96–112)
CO2: 22 mEq/L (ref 19–32)
Calcium: 10.4 mg/dL (ref 8.4–10.5)
Creatinine, Ser: 0.98 mg/dL (ref 0.50–1.10)
GFR calc Af Amer: 66 mL/min — ABNORMAL LOW (ref >90)
GFR calc non Af Amer: 57 mL/min — ABNORMAL LOW (ref >90)
Glucose, Bld: 132 mg/dL — ABNORMAL HIGH (ref 70–99)
POTASSIUM: 3.8 meq/L (ref 3.7–5.3)
Sodium: 140 mEq/L (ref 137–147)
TOTAL PROTEIN: 7.3 g/dL (ref 6.0–8.3)

## 2013-05-15 LAB — I-STAT TROPONIN, ED: TROPONIN I, POC: 0 ng/mL (ref 0.00–0.08)

## 2013-05-15 LAB — CBC
HEMATOCRIT: 41.4 % (ref 36.0–46.0)
HEMOGLOBIN: 13.3 g/dL (ref 12.0–15.0)
MCH: 27.6 pg (ref 26.0–34.0)
MCHC: 32.1 g/dL (ref 30.0–36.0)
MCV: 85.9 fL (ref 78.0–100.0)
Platelets: 275 10*3/uL (ref 150–400)
RBC: 4.82 MIL/uL (ref 3.87–5.11)
RDW: 15.3 % (ref 11.5–15.5)
WBC: 10.5 10*3/uL (ref 4.0–10.5)

## 2013-05-15 LAB — TROPONIN I

## 2013-05-15 LAB — GLUCOSE, CAPILLARY: GLUCOSE-CAPILLARY: 60 mg/dL — AB (ref 70–99)

## 2013-05-15 LAB — TSH: TSH: 0.534 u[IU]/mL (ref 0.350–4.500)

## 2013-05-15 LAB — MAGNESIUM: Magnesium: 1.1 mg/dL — ABNORMAL LOW (ref 1.5–2.5)

## 2013-05-15 LAB — PRO B NATRIURETIC PEPTIDE: PRO B NATRI PEPTIDE: 549.5 pg/mL — AB (ref 0–125)

## 2013-05-15 MED ORDER — SODIUM CHLORIDE 0.9 % IJ SOLN
3.0000 mL | Freq: Two times a day (BID) | INTRAMUSCULAR | Status: DC
  Administered 2013-05-15 – 2013-05-17 (×2): 3 mL via INTRAVENOUS

## 2013-05-15 MED ORDER — METOPROLOL TARTRATE 25 MG PO TABS
125.0000 mg | ORAL_TABLET | Freq: Two times a day (BID) | ORAL | Status: DC
  Administered 2013-05-15 – 2013-05-19 (×8): 125 mg via ORAL
  Filled 2013-05-15 (×10): qty 1

## 2013-05-15 MED ORDER — LEVOTHYROXINE SODIUM 137 MCG PO TABS
137.0000 ug | ORAL_TABLET | Freq: Every day | ORAL | Status: DC
  Administered 2013-05-16 – 2013-05-19 (×4): 137 ug via ORAL
  Filled 2013-05-15 (×5): qty 1

## 2013-05-15 MED ORDER — ASPIRIN EC 81 MG PO TBEC
81.0000 mg | DELAYED_RELEASE_TABLET | Freq: Every day | ORAL | Status: DC
  Filled 2013-05-15: qty 1

## 2013-05-15 MED ORDER — ACETAMINOPHEN 500 MG PO TABS
500.0000 mg | ORAL_TABLET | Freq: Every day | ORAL | Status: DC | PRN
  Administered 2013-05-16 – 2013-05-18 (×3): 500 mg via ORAL
  Filled 2013-05-15 (×3): qty 1

## 2013-05-15 MED ORDER — COLCHICINE 0.6 MG PO TABS
0.6000 mg | ORAL_TABLET | Freq: Every day | ORAL | Status: DC
  Administered 2013-05-15 – 2013-05-18 (×4): 0.6 mg via ORAL
  Filled 2013-05-15 (×6): qty 1

## 2013-05-15 MED ORDER — PANTOPRAZOLE SODIUM 40 MG PO TBEC
40.0000 mg | DELAYED_RELEASE_TABLET | Freq: Two times a day (BID) | ORAL | Status: DC
  Administered 2013-05-15 – 2013-05-17 (×5): 40 mg via ORAL
  Filled 2013-05-15 (×5): qty 1

## 2013-05-15 MED ORDER — VITAMIN D3 25 MCG (1000 UNIT) PO TABS
5000.0000 [IU] | ORAL_TABLET | Freq: Every day | ORAL | Status: DC
  Administered 2013-05-16 – 2013-05-19 (×4): 5000 [IU] via ORAL
  Filled 2013-05-15 (×5): qty 5

## 2013-05-15 MED ORDER — ESTRADIOL 1 MG PO TABS
1.0000 mg | ORAL_TABLET | Freq: Every day | ORAL | Status: DC
  Administered 2013-05-16 – 2013-05-19 (×4): 1 mg via ORAL
  Filled 2013-05-15 (×5): qty 1

## 2013-05-15 MED ORDER — TRAMADOL HCL 50 MG PO TABS
100.0000 mg | ORAL_TABLET | Freq: Four times a day (QID) | ORAL | Status: DC | PRN
  Administered 2013-05-17: 50 mg via ORAL
  Filled 2013-05-15: qty 2

## 2013-05-15 MED ORDER — INSULIN ASPART 100 UNIT/ML ~~LOC~~ SOLN
0.0000 [IU] | Freq: Three times a day (TID) | SUBCUTANEOUS | Status: DC
  Administered 2013-05-16 – 2013-05-17 (×2): 1 [IU] via SUBCUTANEOUS
  Administered 2013-05-17: 3 [IU] via SUBCUTANEOUS
  Administered 2013-05-19: 1 [IU] via SUBCUTANEOUS

## 2013-05-15 MED ORDER — SODIUM CHLORIDE 0.9 % IV SOLN: 250.0000 mL | INTRAVENOUS | Status: DC | PRN

## 2013-05-15 MED ORDER — SODIUM CHLORIDE 0.9 % IJ SOLN: 3.0000 mL | INTRAMUSCULAR | Status: DC | PRN

## 2013-05-15 MED ORDER — RIVAROXABAN 20 MG PO TABS
20.0000 mg | ORAL_TABLET | Freq: Every day | ORAL | Status: DC
  Administered 2013-05-15 – 2013-05-18 (×4): 20 mg via ORAL
  Filled 2013-05-15 (×5): qty 1

## 2013-05-15 MED ORDER — SIMVASTATIN 40 MG PO TABS
40.0000 mg | ORAL_TABLET | Freq: Every evening | ORAL | Status: DC
  Administered 2013-05-15 – 2013-05-18 (×4): 40 mg via ORAL
  Filled 2013-05-15 (×5): qty 1

## 2013-05-15 MED ORDER — CALCIUM CARBONATE ANTACID 500 MG PO CHEW
400.0000 mg | CHEWABLE_TABLET | Freq: Three times a day (TID) | ORAL | Status: DC | PRN
  Filled 2013-05-15: qty 2

## 2013-05-15 MED ORDER — CLONAZEPAM 0.5 MG PO TABS
0.5000 mg | ORAL_TABLET | Freq: Three times a day (TID) | ORAL | Status: DC | PRN
  Administered 2013-05-17: 0.25 mg via ORAL
  Filled 2013-05-15: qty 1

## 2013-05-15 MED ORDER — PROMETHAZINE HCL 25 MG PO TABS
25.0000 mg | ORAL_TABLET | Freq: Four times a day (QID) | ORAL | Status: DC | PRN
  Administered 2013-05-17: 25 mg via ORAL
  Filled 2013-05-15: qty 1

## 2013-05-15 MED ORDER — ASPIRIN 325 MG PO TABS
325.0000 mg | ORAL_TABLET | Freq: Once | ORAL | Status: AC
  Administered 2013-05-15: 325 mg via ORAL
  Filled 2013-05-15: qty 1

## 2013-05-15 NOTE — ED Notes (Signed)
Pt has history of afib, has chip in chest to monitor it, and is on Xarelto.  Pt reports chest pain, sob, and got diaphoretic and felt like she was going to pass out.

## 2013-05-15 NOTE — ED Notes (Signed)
Cardiology PA at bedside. 
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
## 2013-05-16 ENCOUNTER — Encounter: Payer: Self-pay | Admitting: Internal Medicine

## 2013-05-16 DIAGNOSIS — I10 Essential (primary) hypertension: Secondary | ICD-10-CM | POA: Diagnosis present

## 2013-05-16 DIAGNOSIS — E669 Obesity, unspecified: Secondary | ICD-10-CM | POA: Diagnosis present

## 2013-05-16 DIAGNOSIS — Z7901 Long term (current) use of anticoagulants: Secondary | ICD-10-CM | POA: Diagnosis not present

## 2013-05-16 DIAGNOSIS — R0602 Shortness of breath: Secondary | ICD-10-CM | POA: Diagnosis present

## 2013-05-16 DIAGNOSIS — I4892 Unspecified atrial flutter: Secondary | ICD-10-CM | POA: Diagnosis present

## 2013-05-16 DIAGNOSIS — E039 Hypothyroidism, unspecified: Secondary | ICD-10-CM | POA: Diagnosis present

## 2013-05-16 DIAGNOSIS — I428 Other cardiomyopathies: Secondary | ICD-10-CM | POA: Diagnosis present

## 2013-05-16 DIAGNOSIS — J45909 Unspecified asthma, uncomplicated: Secondary | ICD-10-CM | POA: Diagnosis present

## 2013-05-16 DIAGNOSIS — Z79899 Other long term (current) drug therapy: Secondary | ICD-10-CM | POA: Diagnosis not present

## 2013-05-16 DIAGNOSIS — I2510 Atherosclerotic heart disease of native coronary artery without angina pectoris: Secondary | ICD-10-CM | POA: Diagnosis present

## 2013-05-16 DIAGNOSIS — I200 Unstable angina: Secondary | ICD-10-CM | POA: Diagnosis present

## 2013-05-16 DIAGNOSIS — R0601 Orthopnea: Secondary | ICD-10-CM | POA: Diagnosis present

## 2013-05-16 DIAGNOSIS — R61 Generalized hyperhidrosis: Secondary | ICD-10-CM | POA: Diagnosis present

## 2013-05-16 DIAGNOSIS — E119 Type 2 diabetes mellitus without complications: Secondary | ICD-10-CM

## 2013-05-16 DIAGNOSIS — R55 Syncope and collapse: Secondary | ICD-10-CM | POA: Diagnosis present

## 2013-05-16 DIAGNOSIS — E785 Hyperlipidemia, unspecified: Secondary | ICD-10-CM | POA: Diagnosis present

## 2013-05-16 DIAGNOSIS — M1990 Unspecified osteoarthritis, unspecified site: Secondary | ICD-10-CM | POA: Diagnosis present

## 2013-05-16 DIAGNOSIS — K219 Gastro-esophageal reflux disease without esophagitis: Secondary | ICD-10-CM | POA: Diagnosis present

## 2013-05-16 DIAGNOSIS — I4891 Unspecified atrial fibrillation: Secondary | ICD-10-CM | POA: Diagnosis present

## 2013-05-16 LAB — CBC
HEMATOCRIT: 40.4 % (ref 36.0–46.0)
Hemoglobin: 12.9 g/dL (ref 12.0–15.0)
MCH: 27.7 pg (ref 26.0–34.0)
MCHC: 31.9 g/dL (ref 30.0–36.0)
MCV: 86.9 fL (ref 78.0–100.0)
PLATELETS: 267 10*3/uL (ref 150–400)
RBC: 4.65 MIL/uL (ref 3.87–5.11)
RDW: 15.5 % (ref 11.5–15.5)
WBC: 11.4 10*3/uL — ABNORMAL HIGH (ref 4.0–10.5)

## 2013-05-16 LAB — BASIC METABOLIC PANEL
BUN: 20 mg/dL (ref 6–23)
BUN: 19 mg/dL (ref 6–23)
CHLORIDE: 96 meq/L (ref 96–112)
CO2: 26 mEq/L (ref 19–32)
CO2: 23 mEq/L (ref 19–32)
CREATININE: 0.97 mg/dL (ref 0.50–1.10)
CREATININE: 0.85 mg/dL (ref 0.50–1.10)
Calcium: 10.2 mg/dL (ref 8.4–10.5)
Calcium: 9.8 mg/dL (ref 8.4–10.5)
Chloride: 94 mEq/L — ABNORMAL LOW (ref 96–112)
GFR calc non Af Amer: 68 mL/min — ABNORMAL LOW (ref >90)
GFR, EST AFRICAN AMERICAN: 67 mL/min — AB (ref >90)
GFR, EST AFRICAN AMERICAN: 79 mL/min — AB (ref >90)
GFR, EST NON AFRICAN AMERICAN: 58 mL/min — AB (ref >90)
Glucose, Bld: 85 mg/dL (ref 70–99)
Glucose, Bld: 132 mg/dL — ABNORMAL HIGH (ref 70–99)
POTASSIUM: 3.4 meq/L — AB (ref 3.7–5.3)
Potassium: 4 mEq/L (ref 3.7–5.3)
Sodium: 141 mEq/L (ref 137–147)
Sodium: 135 mEq/L — ABNORMAL LOW (ref 137–147)

## 2013-05-16 LAB — GLUCOSE, CAPILLARY
GLUCOSE-CAPILLARY: 116 mg/dL — AB (ref 70–99)
GLUCOSE-CAPILLARY: 119 mg/dL — AB (ref 70–99)
Glucose-Capillary: 112 mg/dL — ABNORMAL HIGH (ref 70–99)
Glucose-Capillary: 144 mg/dL — ABNORMAL HIGH (ref 70–99)
Glucose-Capillary: 122 mg/dL — ABNORMAL HIGH (ref 70–99)

## 2013-05-16 LAB — MAGNESIUM: MAGNESIUM: 2.2 mg/dL (ref 1.5–2.5)

## 2013-05-16 LAB — TROPONIN I

## 2013-05-16 MED ORDER — SODIUM CHLORIDE 0.9 % IJ SOLN: 3.0000 mL | INTRAMUSCULAR | Status: DC | PRN

## 2013-05-16 MED ORDER — METOPROLOL TARTRATE 50 MG PO TABS: 125.0000 mg | ORAL_TABLET | Freq: Two times a day (BID) | ORAL | Status: DC

## 2013-05-16 MED ORDER — SODIUM CHLORIDE 0.9 % IV SOLN: 250.0000 mL | INTRAVENOUS | Status: DC | PRN

## 2013-05-16 MED ORDER — DOFETILIDE 500 MCG PO CAPS
500.0000 ug | ORAL_CAPSULE | Freq: Two times a day (BID) | ORAL | Status: DC
  Administered 2013-05-16 – 2013-05-19 (×6): 500 ug via ORAL
  Filled 2013-05-16 (×8): qty 1

## 2013-05-16 MED ORDER — POTASSIUM CHLORIDE CRYS ER 20 MEQ PO TBCR
40.0000 meq | EXTENDED_RELEASE_TABLET | Freq: Once | ORAL | Status: AC
  Administered 2013-05-16: 40 meq via ORAL
  Filled 2013-05-16: qty 2

## 2013-05-16 MED ORDER — DOFETILIDE 500 MCG PO CAPS: 500.0000 ug | ORAL_CAPSULE | Freq: Two times a day (BID) | ORAL | Status: DC

## 2013-05-16 MED ORDER — SODIUM CHLORIDE 0.9 % IJ SOLN
3.0000 mL | Freq: Two times a day (BID) | INTRAMUSCULAR | Status: DC
  Administered 2013-05-16 – 2013-05-18 (×6): 3 mL via INTRAVENOUS

## 2013-05-16 MED ORDER — MAGNESIUM SULFATE 4000MG/100ML IJ SOLN
4.0000 g | Freq: Once | INTRAMUSCULAR | Status: AC
  Administered 2013-05-16: 4 g via INTRAVENOUS
  Filled 2013-05-16: qty 100

## 2013-05-16 MED ORDER — RANOLAZINE ER 500 MG PO TB12: 500.0000 mg | ORAL_TABLET | Freq: Two times a day (BID) | ORAL | Status: DC

## 2013-05-16 NOTE — Progress Notes (Signed)
Utilization review completed.  

## 2013-05-16 NOTE — Care Management Note (Signed)
    Page 1 of 1   05/16/2013     2:46:54 PM CARE MANAGEMENT NOTE 05/16/2013  Patient:  AIMSLEY, WALSTAD   Account Number:  [id number]  Date Initiated:  05/16/2013  Documentation initiated by:  GRAVES-BIGELOW,Malichi Palardy  Subjective/Objective Assessment:   Pt admitted for tikosyn Load. Pt uses  [facility]  and medication not available. CM did call [facility] in [place] and medication can be ordered.     Action/Plan:   Co pay will be TIKOSYN IS COVERED, NO AUTH REQUIRED, $3.60 AT RETAIL. Pt will need a Rx for a 7 day supply no refills and the original with refills. No further needs from CM at this time.   Anticipated DC Date:  05/16/2013   Anticipated DC Plan:  [place]  CM consult      Choice offered to / List presented to:             Status of service:  Completed, signed off Medicare Important Message given?   (If response is "NO", the following Medicare IM given date fields will be blank) Date Medicare IM given:   Date Additional Medicare IM given:    Discharge Disposition:  HOME/SELF CARE  Per UR Regulation:  Reviewed for med. necessity/level of care/duration of stay  If discussed at Long Length of Stay Meetings, dates discussed:    Comments:  TIKOSYN IS COVERED, NO [place], $3.60 AT RETAIL

## 2013-05-16 NOTE — Progress Notes (Signed)
Pt cbg 60 around 2100. Pt given dinner cbg rechecked & it was 116.

## 2013-05-16 NOTE — Progress Notes (Addendum)
Pharmacy Consult for Dofetilide (Tikosyn) Iniation  Admit Complaint: 73 y.o. female admitted 05/15/2013 with atrial fibrillation to be initiated on dofetilide.   Assessment:  Patient Exclusion Criteria: If any screening criteria checked as "Yes", then  patient  should NOT receive dofetilide until criteria item is corrected. If "Yes" please indicate correction plan.  YES  NO Patient  Exclusion Criteria Correction Plan  []  [x]  Baseline QTc interval is greater than or equal to 440 msec. IF above YES box checked dofetilide contraindicated unless patient has ICD; then may proceed if QTc 500-550 msec or with known ventricular conduction abnormalities may proceed with QTc 550-600 msec. QTc =   QTc 420 msec  [x]  []  Magnesium level is less than 1.8 mEq/l : Last magnesium:  Lab Results  Component Value Date   MG 1.1* 05/15/2013       Given 4g  [x]  []  Potassium level is less than 4 mEq/l : Last potassium:  Lab Results  Component Value Date   K 3.4* 05/16/2013       Given 40 meq  []  [x]  Patient is known or suspected to have a digoxin level greater than 2 ng/ml: No results found for this basename: DIGOXIN      []  [x]  Creatinine clearance less than 20 ml/min (calculated using Cockcroft-Gault, actual body weight and serum creatinine): Estimated Creatinine Clearance: 56.9 ml/min (by C-G formula based on Cr of 0.97).    []  [x]  Patient has received drugs known to prolong the QT intervals within the last 48 hours(phenothiazines, tricyclics or tetracyclic antidepressants, erythromycin, H-1 antihistamines, cisapride, fluoroquinolones, azithromycin). Drugs not listed above may have an, as yet, undetected potential to prolong the QT interval, updated information on QT prolonging agents is available at this website:QT prolonging agents   [x]  []  Patient received a dose of hydrochlorothiazide (Oretic) alone or in any combination including triamterene (Dyazide, Maxzide) in the last 48 hours. Last dose 4/20,  replacing lytes  []  [x]  Patient received a medication known to increase dofetilide plasma concentrations prior to initial dofetilide dose:    Trimethoprim (Primsol, Proloprim) in the last 36 hours   Verapamil (Calan, Verelan) in the last 36 hours or a sustained release dose in the last 72 hours   Megestrol (Megace) in the last 5 days    Cimetidine (Tagamet) in the last 6 hours   Ketoconazole (Nizoral) in the last 24 hours   Itraconazole (Sporanox) in the last 48 hours    Prochlorperazine (Compazine) in the last 36 hours    []  [x]  Patient is known to have a history of torsades de pointes; congenital or acquired long QT syndromes.   []  [x]  Patient has received a Class 1 antiarrhythmic with less than 2 half-lives since last dose. (Disopyramide, Quinidine, Procainamide, Lidocaine, Mexiletine, Flecainide, Propafenone)   []  [x]  Patient has received amiodarone therapy in the past 3 months or amiodarone level is greater than 0.3 ng/ml.    Patient has been appropriately anticoagulated with Xarelto.  Ordering provider was confirmed at [email or web address] if they are not listed on the [place] Prescrib[facility] list.  Goal of Therapy:  Follow renal function, electrolytes, potential drug interactions, and dose adjustment. Provide education and 1 week supply at discharge.  Plan:  1.  Initiate dofetilide dose based on renal function: Select One Calculated CrCl  Dose q12h  [x]  > 60 ml/min 500 mcg  []  40-60 ml/min 250 mcg  []  20-40 ml/min 125 mcg   2. Follow up QTc after the first 5 doses, renal  function, electrolytes (K & Mg) daily x 3     days, dose adjustment, success of initiation and facilitate 1 week discharge supply as     clinically indicated.  3. Initiate Tikosyn education video (Call [phone] and ask for video # [phone]).  4. Place Enrollment Form on the chart for discharge supply of dofetilide.   [place], Pharm.D., BCPS Clinical Pharmacist Pager: [phone] 05/16/2013 11:44  AM
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
## 2013-05-16 NOTE — Progress Notes (Signed)
EKG reviewed pre and post tikosyn dose.  QTc difficult to determine given baseline artifact and irregular rhythm but appears to be stable (449ms) both before and after dose.  Can continue tikosyn with repeat EKG in the morning.

## 2013-05-16 NOTE — Progress Notes (Signed)
Cardiologist notified of pt's hr going up to the 140s non-sustaining. Pt asymptmatic. HR higher when pt up ambulating. Will continue to the pt. Hoover Brunette, RN

## 2013-05-17 ENCOUNTER — Encounter: Payer: Medicare HMO | Admitting: Internal Medicine

## 2013-05-17 ENCOUNTER — Encounter: Payer: Self-pay | Admitting: Internal Medicine

## 2013-05-17 LAB — MAGNESIUM: Magnesium: 1.5 mg/dL (ref 1.5–2.5)

## 2013-05-17 LAB — BASIC METABOLIC PANEL
BUN: 15 mg/dL (ref 6–23)
CHLORIDE: 98 meq/L (ref 96–112)
CO2: 25 mEq/L (ref 19–32)
Calcium: 9.9 mg/dL (ref 8.4–10.5)
Creatinine, Ser: 0.89 mg/dL (ref 0.50–1.10)
GFR, EST AFRICAN AMERICAN: 74 mL/min — AB (ref >90)
GFR, EST NON AFRICAN AMERICAN: 64 mL/min — AB (ref >90)
Glucose, Bld: 135 mg/dL — ABNORMAL HIGH (ref 70–99)
Potassium: 4 mEq/L (ref 3.7–5.3)
Sodium: 140 mEq/L (ref 137–147)

## 2013-05-17 LAB — GLUCOSE, CAPILLARY
GLUCOSE-CAPILLARY: 145 mg/dL — AB (ref 70–99)
Glucose-Capillary: 211 mg/dL — ABNORMAL HIGH (ref 70–99)
Glucose-Capillary: 109 mg/dL — ABNORMAL HIGH (ref 70–99)
Glucose-Capillary: 113 mg/dL — ABNORMAL HIGH (ref 70–99)

## 2013-05-17 MED ORDER — MAGNESIUM SULFATE 50 % IJ SOLN
3.0000 g | Freq: Once | INTRAVENOUS | Status: AC
  Administered 2013-05-17: 3 g via INTRAVENOUS
  Filled 2013-05-17: qty 6

## 2013-05-17 NOTE — Progress Notes (Signed)
SUBJECTIVE: The patient is symptomatically stable this morning.  She feels that breathing is stable.  No chest pain or palpitations.  Tikosyn started last night.  Labs reviewed, QTc stable.   CURRENT MEDICATIONS: . cholecalciferol  5,000 Units Oral Daily  . colchicine  0.6 mg Oral Daily  . dofetilide  500 mcg Oral BID  . estradiol  1 mg Oral Daily  . insulin aspart  0-9 Units Subcutaneous TID WC  . levothyroxine  137 mcg Oral QAC breakfast  . metoprolol  125 mg Oral BID  . pantoprazole  40 mg Oral BID  . rivaroxaban  20 mg Oral Q supper  . simvastatin  40 mg Oral QPM  . sodium chloride  3 mL Intravenous Q12H  . sodium chloride  3 mL Intravenous Q12H      OBJECTIVE: Physical Exam: Filed Vitals:   05/16/13 1301 05/16/13 1936 05/17/13 0010 05/17/13 0444  BP: 93/40 113/64 110/83 122/97  Pulse: 86 87 124 160  Temp: 97.5 F (36.4 C) 97.8 F (36.6 C)  98 F (36.7 C)  TempSrc: Oral Oral  Oral  Resp: 20 20  20   Height:      Weight:    203 lb 0.8 oz (92.102 kg)  SpO2: 90% 94%  98%    Intake/Output Summary (Last 24 hours) at 05/17/13 0649 Last data filed at 05/17/13 0600  Gross per 24 hour  Intake    488 ml  Output   1950 ml  Net  -1462 ml    Telemetry reveals atrial fibrillation, v rates 90-140  GEN- The patient is well appearing, alert and oriented x 3 today.   Head- normocephalic, atraumatic Eyes-  Sclera clear, conjunctiva pink Ears- hearing intact Oropharynx- clear Neck- supple, no JVP Lymph- no cervical lymphadenopathy Lungs- Clear to ausculation bilaterally, normal work of breathing Heart- irregular rate and rhythm, no murmurs, rubs or gallops, PMI not laterally displaced GI- soft, NT, ND, + BS Extremities- no clubbing, cyanosis, or edema Neuro- strength and sensation are intact  LABS: Basic Metabolic Panel:  Recent Labs  05/16/13 1306 05/16/13 1632 05/17/13 0422  NA 135*  --  140  K 4.0  --  4.0  CL 94*  --  98  CO2 23  --  25  GLUCOSE 132*   --  135*  BUN 19  --  15  CREATININE 0.85  --  0.89  CALCIUM 9.8  --  9.9  MG  --  2.2 1.5   Liver Function Tests:  Recent Labs  05/15/13 1520  AST 32  ALT 20  ALKPHOS 48  BILITOT 0.3  PROT 7.3  ALBUMIN 3.8   CBC:  Recent Labs  05/15/13 1520 05/16/13 0117  WBC 10.5 11.4*  HGB 13.3 12.9  HCT 41.4 40.4  MCV 85.9 86.9  PLT 275 267   Cardiac Enzymes:  Recent Labs  05/15/13 2011 05/16/13 0117  TROPONINI <0.30 <0.30   Thyroid Function Tests:  Recent Labs  05/15/13 2011  TSH 0.534    RADIOLOGY: Dg Chest 2 View 05/15/2013   CLINICAL DATA:  Chest pain.  Short of breath.  EXAM: CHEST  2 VIEW  COMPARISON:  08/10/2012  FINDINGS: Artifact overlies the chest. The heart is mildly enlarged. The aorta is unfolded. There is mild linear scarring at the lung bases. No evidence of active infiltrate, mass, effusion or collapse.  IMPRESSION: Mild cardiomegaly. Linear scarring at the bases. No active disease evident.   Electronically Signed   By: Elta [place]oupe  Shogry M.D.   On: 05/15/2013 16:20   Ct Head Wo Contrast 05/15/2013   CLINICAL DATA:  Chest pain, diaphoresis.  Presyncope.  EXAM: CT HEAD WITHOUT CONTRAST  TECHNIQUE: Contiguous axial images were obtained from the base of the skull through the vertex without contrast.  COMPARISON:  01/29/2011 MR brain.  FINDINGS: No evidence for acute infarction, hemorrhage, mass lesion, hydrocephalus, or extra-axial fluid. Mild atrophy. Mild small vessel ischemic changes. No skull fracture. Carotid atherosclerosis. No sinus or mastoid disease. Previous repair of a left lateral orbital facial fracture appears unremarkable.  IMPRESSION: No acute intracranial abnormality.  No change from prior MR.   Electronically Signed   By: Rolla Flatten M.D.   On: 05/15/2013 16:58    ASSESSMENT AND PLAN:  Active Problems:   Atrial fibrillation  1. afib Remains in afib  If she is still in afib tomorrow then she will require cardioversion Continue current  anticoagulation with xarelto  2. Diabetes  Continue current regiment   3. htn  Stable  No change required today   4. Obesity  Weight loss advised

## 2013-05-17 NOTE — Progress Notes (Signed)
Patient's heart rhythm converted to sinus rhythm in the 60's at 0903. Getting an EKG to confirm. Will continue to monitor. Spoke with [employer], about daughter requesting an update. Amber to call daughter.

## 2013-05-17 NOTE — Progress Notes (Signed)
Pt's Hr continues to go up as high as 160 nonsustained but does sustain between 120-150 for several minutes before going back down. Pt denies symptoms except feeling the fluttering in her chest. Dr. Kennith Center notified and no new orders only to notify for HR sustaining above 140. Will cont to monitor.

## 2013-05-18 ENCOUNTER — Encounter: Payer: Medicare HMO | Admitting: Internal Medicine

## 2013-05-18 ENCOUNTER — Encounter (HOSPITAL_COMMUNITY): Payer: Medicare HMO | Admitting: Anesthesiology

## 2013-05-18 ENCOUNTER — Encounter (HOSPITAL_COMMUNITY)
Admission: EM | Disposition: A | Discharge status: Home / Self Care | Payer: Medicare HMO | Source: Home / Self Care | Attending: Internal Medicine

## 2013-05-18 ENCOUNTER — Encounter (HOSPITAL_COMMUNITY): Payer: Self-pay | Admitting: Anesthesiology

## 2013-05-18 LAB — GLUCOSE, CAPILLARY
GLUCOSE-CAPILLARY: 141 mg/dL — AB (ref 70–99)
GLUCOSE-CAPILLARY: 139 mg/dL — AB (ref 70–99)
GLUCOSE-CAPILLARY: 147 mg/dL — AB (ref 70–99)
Glucose-Capillary: 130 mg/dL — ABNORMAL HIGH (ref 70–99)

## 2013-05-18 LAB — BASIC METABOLIC PANEL
BUN: 17 mg/dL (ref 6–23)
CO2: 29 mEq/L (ref 19–32)
Calcium: 9.6 mg/dL (ref 8.4–10.5)
Chloride: 96 mEq/L (ref 96–112)
Creatinine, Ser: 0.86 mg/dL (ref 0.50–1.10)
GFR calc Af Amer: 78 mL/min — ABNORMAL LOW (ref >90)
GFR, EST NON AFRICAN AMERICAN: 67 mL/min — AB (ref >90)
Glucose, Bld: 141 mg/dL — ABNORMAL HIGH (ref 70–99)
POTASSIUM: 4.4 meq/L (ref 3.7–5.3)
SODIUM: 139 meq/L (ref 137–147)

## 2013-05-18 LAB — MAGNESIUM: Magnesium: 1.6 mg/dL (ref 1.5–2.5)

## 2013-05-18 SURGERY — CARDIOVERSION
Anesthesia: Monitor Anesthesia Care

## 2013-05-18 MED ORDER — MAGNESIUM OXIDE 400 (241.3 MG) MG PO TABS
400.0000 mg | ORAL_TABLET | Freq: Two times a day (BID) | ORAL | Status: DC
  Administered 2013-05-18 – 2013-05-19 (×2): 400 mg via ORAL
  Filled 2013-05-18 (×5): qty 1

## 2013-05-18 MED ORDER — MAGNESIUM SULFATE 40 MG/ML IJ SOLN
2.0000 g | Freq: Once | INTRAMUSCULAR | Status: DC
  Filled 2013-05-18: qty 50

## 2013-05-18 MED ORDER — MAGNESIUM SULFATE 40 MG/ML IJ SOLN
2.0000 g | Freq: Once | INTRAMUSCULAR | Status: AC
  Administered 2013-05-18: 2 g via INTRAVENOUS
  Filled 2013-05-18: qty 50

## 2013-05-18 MED ORDER — PANTOPRAZOLE SODIUM 40 MG PO TBEC
40.0000 mg | DELAYED_RELEASE_TABLET | Freq: Every day | ORAL | Status: DC
  Administered 2013-05-19: 40 mg via ORAL

## 2013-05-18 NOTE — Progress Notes (Signed)
Patient: [name] Date of Encounter: 05/18/2013, 9:39 AM Admit date: 05/15/2013     Subjective  [name] is doing well this AM. She has no complaints. She remains in SR.   Objective  Physical Exam: Vitals: BP 104/58  Pulse 73  Temp(Src) 97.8 F (36.6 C) (Oral)  Resp 20  Ht 5\' 2"  (1.575 m)  Wt 202 lb 11.2 oz (91.944 kg)  BMI 37.06 kg/m2  SpO2 95% General: Well developed, well appearing 73 year old female in no acute distress. Neck: Supple. JVD not elevated. Lungs: Clear bilaterally to auscultation without wheezes, rales, or rhonchi. Breathing is unlabored. Heart: RRR S1 S2 without murmurs, rubs, or gallops.  Abdomen: Soft, non-distended. Extremities: No clubbing or cyanosis. No edema.  Distal pedal pulses are 2+ and equal bilaterally. Neuro: Alert and oriented X 3. Moves all extremities spontaneously. No focal deficits.  Intake/Output:  Intake/Output Summary (Last 24 hours) at 05/18/13 0939 Last data filed at 05/18/13 0231  Gross per 24 hour  Intake    200 ml  Output    950 ml  Net   -750 ml    Inpatient Medications:  . cholecalciferol  5,000 Units Oral Daily  . colchicine  0.6 mg Oral Daily  . dofetilide  500 mcg Oral BID  . estradiol  1 mg Oral Daily  . insulin aspart  0-9 Units Subcutaneous TID WC  . levothyroxine  137 mcg Oral QAC breakfast  . metoprolol  125 mg Oral BID  . pantoprazole  40 mg Oral BID  . rivaroxaban  20 mg Oral Q supper  . simvastatin  40 mg Oral QPM  . sodium chloride  3 mL Intravenous Q12H  . sodium chloride  3 mL Intravenous Q12H    Labs:  Recent Labs  05/17/13 0422 05/18/13 0533  NA 140 139  K 4.0 4.4  CL 98 96  CO2 25 29  GLUCOSE 135* 141*  BUN 15 17  CREATININE 0.89 0.86  CALCIUM 9.9 9.6  MG 1.5 1.6    Recent Labs  05/15/13 1520  AST 32  ALT 20  ALKPHOS 48  BILITOT 0.3  PROT 7.3  ALBUMIN 3.8    Recent Labs  05/15/13 1520 05/16/13 0117  WBC 10.5 11.4*  HGB 13.3 12.9  HCT 41.4 40.4  MCV 85.9  86.9  PLT 275 267    Recent Labs  05/15/13 2011 05/16/13 0117  TROPONINI <0.30 <0.30    Recent Labs  05/15/13 2011  TSH 0.534    Radiology/Studies: Dg Chest 2 View  05/15/2013   CLINICAL DATA:  Chest pain.  Short of breath.  EXAM: CHEST  2 VIEW  COMPARISON:  08/10/2012  FINDINGS: Artifact overlies the chest. The heart is mildly enlarged. The aorta is unfolded. There is mild linear scarring at the lung bases. No evidence of active infiltrate, mass, effusion or collapse.  IMPRESSION: Mild cardiomegaly. Linear scarring at the bases. No active disease evident.   Electronically Signed   By: Nelson Chimes M.D.   On: 05/15/2013 16:20   Ct Head Wo Contrast  05/15/2013   CLINICAL DATA:  Chest pain, diaphoresis.  Presyncope.  EXAM: CT HEAD WITHOUT CONTRAST  TECHNIQUE: Contiguous axial images were obtained from the base of the skull through the vertex without contrast.  COMPARISON:  01/29/2011 MR brain.  FINDINGS: No evidence for acute infarction, hemorrhage, mass lesion, hydrocephalus, or extra-axial fluid. Mild atrophy. Mild small vessel ischemic changes. No skull fracture. Carotid atherosclerosis. No sinus  or mastoid disease. Previous repair of a left lateral orbital facial fracture appears unremarkable.  IMPRESSION: No acute intracranial abnormality.  No change from prior MR.   Electronically Signed   By: Rolla Flatten M.D.   On: 05/15/2013 16:58   Telemetry: SR 12-lead ECG: SR; corrected QTc 469 msec   Assessment and Plan  1. Atrial fibrillation - now in SR on Tikosyn - serum Cr stable, potassium normal, magnesium low but up from yesterday; will continue to replete - QTc stable  - continue current Tikosyn dosing - continue Xarelto for stroke prevention   Signed, Brooke O Edmisten PA-C  I have seen, examined the patient, and reviewed the above assessment and plan.  Changes to above are made where necessary.  Continue tikosyn and follow QT (stable).  Replete Mg Anticipate discharge  tomorrow AM.  She will need close outpatient monitoring of electrolytes and ekg.  Co Sign: [name], MD 05/18/2013 2:44 PM

## 2013-05-18 NOTE — Discharge Instructions (Addendum)
Information on my medicine - XARELTO (Rivaroxaban)  This medication education was reviewed with me or my healthcare representative as part of my discharge preparation.  The pharmacist that spoke with me during my hospital stay was:  [place], RPH  Why was Xarelto prescribed for you? Xarelto was prescribed for you to reduce the risk of a blood clot forming that can cause a stroke if you have a medical condition called atrial fibrillation (a type of irregular heartbeat).  What do you need to know about xarelto ? Take your Xarelto ONCE DAILY at the same time every day with your evening meal. If you have difficulty swallowing the tablet whole, you may crush it and mix in applesauce just prior to taking your dose.  Take Xarelto exactly as prescribed by your doctor and DO NOT stop taking Xarelto without talking to the doctor who prescribed the medication.  Stopping without other stroke prevention medication to take the place of Xarelto may increase your risk of developing a clot that causes a stroke.  Refill your prescription before you run out.  After discharge, you should have regular check-up appointments with your healthcare provider that is prescribing your Xarelto.  In the future your dose may need to be changed if your kidney function or weight changes by a significant amount.  What do you do if you miss a dose? If you are taking Xarelto ONCE DAILY and you miss a dose, take it as soon as you remember on the same day then continue your regularly scheduled once daily regimen the next day. Do not take two doses of Xarelto at the same time or on the same day.   Important Safety Information A possible side effect of Xarelto is bleeding. You should call your healthcare provider right away if you experience any of the following:   Bleeding from an injury or your nose that does not stop.   Unusual colored urine (red or dark brown) or unusual colored stools (red or black).   Unusual  bruising for unknown reasons.   A serious fall or if you hit your head (even if there is no bleeding).  Some medicines may interact with Xarelto and might increase your risk of bleeding while on Xarelto. To help avoid this, consult your healthcare provider or pharmacist prior to using any new prescription or non-prescription medications, including herbals, vitamins, non-steroidal anti-inflammatory drugs (NSAIDs) and supplements.  This website has more information on Xarelto: [email or web address]

## 2013-05-19 ENCOUNTER — Encounter: Payer: Self-pay | Admitting: Internal Medicine

## 2013-05-19 ENCOUNTER — Other Ambulatory Visit: Payer: Self-pay | Admitting: Physician Assistant

## 2013-05-19 DIAGNOSIS — I4891 Unspecified atrial fibrillation: Secondary | ICD-10-CM

## 2013-05-19 LAB — BASIC METABOLIC PANEL
BUN: 16 mg/dL (ref 6–23)
CO2: 26 meq/L (ref 19–32)
CREATININE: 0.91 mg/dL (ref 0.50–1.10)
Calcium: 9.8 mg/dL (ref 8.4–10.5)
Chloride: 98 mEq/L (ref 96–112)
GFR calc non Af Amer: 62 mL/min — ABNORMAL LOW (ref >90)
GFR, EST AFRICAN AMERICAN: 72 mL/min — AB (ref >90)
Glucose, Bld: 173 mg/dL — ABNORMAL HIGH (ref 70–99)
Potassium: 3.9 mEq/L (ref 3.7–5.3)
SODIUM: 141 meq/L (ref 137–147)

## 2013-05-19 LAB — MAGNESIUM: MAGNESIUM: 1.7 mg/dL (ref 1.5–2.5)

## 2013-05-19 LAB — GLUCOSE, CAPILLARY
GLUCOSE-CAPILLARY: 166 mg/dL — AB (ref 70–99)
Glucose-Capillary: 136 mg/dL — ABNORMAL HIGH (ref 70–99)

## 2013-05-19 MED ORDER — MAGNESIUM OXIDE 400 (241.3 MG) MG PO TABS: 400.0000 mg | ORAL_TABLET | Freq: Two times a day (BID) | ORAL | Status: DC

## 2013-05-19 MED ORDER — LANSOPRAZOLE 30 MG PO CPDR: 30.0000 mg | DELAYED_RELEASE_CAPSULE | Freq: Every day | ORAL | Status: DC

## 2013-05-19 MED ORDER — DOFETILIDE 500 MCG PO CAPS: 500.0000 ug | ORAL_CAPSULE | Freq: Two times a day (BID) | ORAL | Status: DC

## 2013-05-19 MED ORDER — POTASSIUM CHLORIDE CRYS ER 20 MEQ PO TBCR
20.0000 meq | EXTENDED_RELEASE_TABLET | Freq: Once | ORAL | Status: AC
  Administered 2013-05-19: 20 meq via ORAL
  Filled 2013-05-19: qty 1

## 2013-05-19 MED ORDER — POTASSIUM CHLORIDE ER 10 MEQ PO TBCR: 10.0000 meq | EXTENDED_RELEASE_TABLET | ORAL | Status: DC

## 2013-05-19 NOTE — Progress Notes (Signed)
SUBJECTIVE: The patient feels well this morning. No chest pain, shortness of breath, or palpitations.  Maintaining SR on Tikosyn.   Labs pending, QTc stable.   CURRENT MEDICATIONS: . cholecalciferol  5,000 Units Oral Daily  . colchicine  0.6 mg Oral Daily  . dofetilide  500 mcg Oral BID  . estradiol  1 mg Oral Daily  . insulin aspart  0-9 Units Subcutaneous TID WC  . levothyroxine  137 mcg Oral QAC breakfast  . magnesium oxide  400 mg Oral BID  . metoprolol  125 mg Oral BID  . pantoprazole  40 mg Oral Daily  . rivaroxaban  20 mg Oral Q supper  . simvastatin  40 mg Oral QPM  . sodium chloride  3 mL Intravenous Q12H  . sodium chloride  3 mL Intravenous Q12H      OBJECTIVE: Physical Exam: Filed Vitals:   05/18/13 0524 05/18/13 1417 05/18/13 2007 05/19/13 0430  BP: 104/58 106/51 112/61 123/62  Pulse:  65 75 66  Temp:  94.5 F (34.7 C) 98.4 F (36.9 C) 97.9 F (36.6 C)  TempSrc:  Oral Oral Oral  Resp:  19 18 18   Height:      Weight:    202 lb 0.6 oz (91.644 kg)  SpO2:  95% 94% 95%    Intake/Output Summary (Last 24 hours) at 05/19/13 0650 Last data filed at 05/19/13 0432  Gross per 24 hour  Intake    360 ml  Output   1100 ml  Net   -740 ml    Telemetry reveals sinus rhythm  GEN- The patient is well appearing, alert and oriented x 3 today.   Head- normocephalic, atraumatic Eyes-  Sclera clear, conjunctiva pink Ears- hearing intact Oropharynx- clear Neck- supple, no JVP Lymph- no cervical lymphadenopathy Lungs- Clear to ausculation bilaterally, normal work of breathing Heart- regular rate and rhythm,  GI- soft, NT, ND, + BS Extremities- no clubbing, cyanosis, or edema Neuro- strength and sensation are intact  LABS: Basic Metabolic Panel:  Recent Labs  05/17/13 0422 05/18/13 0533  NA 140 139  K 4.0 4.4  CL 98 96  CO2 25 29  GLUCOSE 135* 141*  BUN 15 17  CREATININE 0.89 0.86  CALCIUM 9.9 9.6  MG 1.5 1.6    RADIOLOGY: Dg Chest 2 View 05/15/2013    CLINICAL DATA:  Chest pain.  Short of breath.  EXAM: CHEST  2 VIEW  COMPARISON:  08/10/2012  FINDINGS: Artifact overlies the chest. The heart is mildly enlarged. The aorta is unfolded. There is mild linear scarring at the lung bases. No evidence of active infiltrate, mass, effusion or collapse.  IMPRESSION: Mild cardiomegaly. Linear scarring at the bases. No active disease evident.   Electronically Signed   By: Nelson Chimes M.D.   On: 05/15/2013 16:20   Ct Head Wo Contrast 05/15/2013   CLINICAL DATA:  Chest pain, diaphoresis.  Presyncope.  EXAM: CT HEAD WITHOUT CONTRAST  TECHNIQUE: Contiguous axial images were obtained from the base of the skull through the vertex without contrast.  COMPARISON:  01/29/2011 MR brain.  FINDINGS: No evidence for acute infarction, hemorrhage, mass lesion, hydrocephalus, or extra-axial fluid. Mild atrophy. Mild small vessel ischemic changes. No skull fracture. Carotid atherosclerosis. No sinus or mastoid disease. Previous repair of a left lateral orbital facial fracture appears unremarkable.  IMPRESSION: No acute intracranial abnormality.  No change from prior MR.   Electronically Signed   By: Rolla Flatten M.D.   On: 05/15/2013 16:58  ASSESSMENT AND PLAN:  Active Problems:   Atrial fibrillation  1. afib Maintaining SR on Tikosyn Qt is stable Continue current medicines at discharge Add magnesium 400mg  BID Decrease protonix to daily  2. Diabetes  Continue current regiment   3. htn  Stable  No change required today   4. Obesity  Weight loss advised  Will need repeat bmet, mg and ekg in 1 week Brooke or I will need to see in 4 weeks

## 2013-05-19 NOTE — Discharge Summary (Signed)
CARDIOLOGY DISCHARGE SUMMARY   Patient ID: [name] MRN: [id number] DOB/AGE: [birth year] 73 y.o.  Admit date: 05/15/2013 Discharge date: 05/19/2013  PCP: Octavio Graves, DO Primary Cardiologist: Dr. [name]  Primary Discharge Diagnosis:    Atrial fibrillation, rapid ventricular response Secondary Discharge Diagnosis:  Near-syncope Chronic anticoagulation with Xarelto Diabetes Hypertension Hypothyroidism  Procedures: two-view chest x-ray, CT of the head without contrast   Hospital Course: [name] is a 73 y.o. female with a history of CAD. She had chest pressure dyspnea and was thought related to atrial fibrillation with rapid ventricular response. She also had presyncope with this. She came to the emergency room and was admitted for further evaluation and treatment.  She had been on Xarelto prior to admission and this was continued. Her TSH was checked and was within normal limits so no dose change on her Synthroid was indicated. A head CT was checked because of her presyncope but had no acute issues.   She was evaluated by EP for potential antiarrhythmic therapy. All data were reviewed and Tikosyn was felt to be the best option. Preparations were made for Tikosyn including supplementing her potassium and discontinuing her HCTZ. Her magnesium required supplementation as well. Pharmacy assistance was obtained and the correct dose was calculated. She was started on 500 mcg every 12 hours. She tolerated this dose well and her QTC did not prolong significantly.  Her blood sugars were managed with sliding scale insulin. Her heart rate was elevated but she had not tolerated Cardizem while in the past so she was on her home dose of metoprolol at 125 mg twice a day.  She spontaneously converted to sinus rhythm on 04/22. Her heart rate was in the 60s. She maintained a sinus rhythm during her hospital stay.  On 04/24, she was seen by Dr. Rayann Heman and all data were reviewed.  She was maintaining sinus rhythm on Tikosyn and her QTC was stable. She will require potassium supplementation at discharge, at a low dose and magnesium supplementation as well. No further inpatient workup is indicated and she is considered stable for discharge, to follow up as an outpatient.  Labs:   Lab Results  Component Value Date   WBC 11.4* 05/16/2013   HGB 12.9 05/16/2013   HCT 40.4 05/16/2013   MCV 86.9 05/16/2013   PLT 267 05/16/2013    Recent Labs Lab 05/15/13 1520  05/19/13 0536  NA 140  < > 141  K 3.8  < > 3.9  CL 96  < > 98  CO2 22  < > 26  BUN 20  < > 16  CREATININE 0.98  < > 0.91  CALCIUM 10.4  < > 9.8  PROT 7.3  --   --   BILITOT 0.3  --   --   ALKPHOS 48  --   --   ALT 20  --   --   AST 32  --   --   GLUCOSE 132*  < > 173*  < > = values in this interval not displayed. Lab Results  Component Value Date   TSH 0.534 05/15/2013   Pro B Natriuretic peptide (BNP)  Date/Time Value Ref Range Status  05/15/2013  3:20 PM 549.5* 0 - 125 pg/mL Final  01/08/2013  2:05 PM 373.8* 0 - 125 pg/mL Final     Radiology: Dg Chest 2 View 05/15/2013   CLINICAL DATA:  Chest pain.  Short of breath.  EXAM: CHEST  2 VIEW  COMPARISON:  08/10/2012  FINDINGS: Artifact overlies the chest. The heart is mildly enlarged. The aorta is unfolded. There is mild linear scarring at the lung bases. No evidence of active infiltrate, mass, effusion or collapse.  IMPRESSION: Mild cardiomegaly. Linear scarring at the bases. No active disease evident.   Electronically Signed   By: Nelson Chimes M.D.   On: 05/15/2013 16:20   Ct Head Wo Contrast 05/15/2013   CLINICAL DATA:  Chest pain, diaphoresis.  Presyncope.  EXAM: CT HEAD WITHOUT CONTRAST  TECHNIQUE: Contiguous axial images were obtained from the base of the skull through the vertex without contrast.  COMPARISON:  01/29/2011 MR brain.  FINDINGS: No evidence for acute infarction, hemorrhage, mass lesion, hydrocephalus, or extra-axial fluid. Mild atrophy. Mild  small vessel ischemic changes. No skull fracture. Carotid atherosclerosis. No sinus or mastoid disease. Previous repair of a left lateral orbital facial fracture appears unremarkable.  IMPRESSION: No acute intracranial abnormality.  No change from prior MR.   Electronically Signed   By: Rolla Flatten M.D.   On: 05/15/2013 16:58    EKG: 05/19/2013 Sinus rhythm (read as junctional but is sinus) Vent. rate 64 BPM PR interval 160 ms QRS duration 74 ms QT/QTc 354/365 ms P-R-T axes * 45 87  FOLLOW UP PLANS AND APPOINTMENTS Allergies  Allergen Reactions  . Blueberry Flavor Anaphylaxis  . Januvia [Sitagliptin] Shortness Of Breath  . Lipitor [Atorvastatin] Shortness Of Breath  . Losartan Shortness Of Breath  . Penicillins Anaphylaxis  . Prednisone Anaphylaxis  . Vancomycin Anaphylaxis  . Avelox [Moxifloxacin Hcl In Nacl]   . Cefprozil     REACTION: unknown reaction  . Cetacaine [Butamben-Tetracaine-Benzocaine] Nausea And Vomiting and Swelling  . Cetacaine [Butamben-Tetracaine-Benzocaine]   . Dicyclomine Nausea And Vomiting and Other (See Comments)    Headaches and increased blood sugars  . Dicyclomine Other (See Comments)    "Heart trouble"  . Diltiazem     Nausea and chest pain  . Food     Melons. Bananas.   . Hydrocodone Hives  . Imdur [Isosorbide]     Hives,headache,palpitations  . Losartan Potassium Other (See Comments)    Shortness of breath   . Nitroglycerin     Made patient go into cardiac arrest  . Oxycodone Hives  . Oxycodone   . Avelox [Moxifloxacin] Swelling and Rash     Medication List    STOP taking these medications       hydrochlorothiazide 12.5 MG capsule  Commonly known as:  MICROZIDE      TAKE these medications       ACCU-CHEK SMARTVIEW test strip  Generic drug:  glucose blood  Use as direceted     acetaminophen 500 MG tablet  Commonly known as:  TYLENOL  Take 500 mg by mouth daily as needed for moderate pain.     aspirin 81 MG EC tablet  Take  1 tablet (81 mg total) by mouth daily.     BD PEN NEEDLE NANO U/F 32G X 4 MM Misc  Generic drug:  Insulin Pen Needle  Use as directed     BYDUREON 2 MG Pen  Generic drug:  Exenatide ER  Inject 1 each into the skin every 7 (seven) days. Wednesday     cholecalciferol 1000 UNITS tablet  Commonly known as:  VITAMIN D  Take 5,000 Units by mouth daily.     clonazePAM 0.5 MG tablet  Commonly known as:  KLONOPIN  Take 0.5 mg by mouth 3 (three) times daily  as needed for anxiety.     colchicine 0.6 MG tablet  Take 0.6 mg by mouth daily.     dofetilide 500 MCG capsule  Commonly known as:  TIKOSYN  Take 1 capsule (500 mcg total) by mouth 2 (two) times daily.     estradiol 1 MG tablet  Commonly known as:  ESTRACE  Take 1 mg by mouth daily.     glipiZIDE 10 MG 24 hr tablet  Commonly known as:  GLUCOTROL XL  Take 10 mg by mouth daily with breakfast.     lansoprazole 30 MG capsule  Commonly known as:  PREVACID  Take 1 capsule (30 mg total) by mouth daily at 12 noon.     levothyroxine 137 MCG tablet  Commonly known as:  SYNTHROID, LEVOTHROID  Take 137 mcg by mouth daily before breakfast.     magnesium oxide 400 (241.3 MG) MG tablet  Commonly known as:  MAG-OX  Take 1 tablet (400 mg total) by mouth 2 (two) times daily.     metFORMIN 500 MG 24 hr tablet  Commonly known as:  GLUCOPHAGE-XR  Take 500 mg by mouth 2 (two) times daily.     metoprolol 50 MG tablet  Commonly known as:  LOPRESSOR  Take 2.5 tablets (125 mg total) by mouth 2 (two) times daily.     potassium chloride 10 MEQ tablet  Commonly known as:  K-DUR  Take 1 tablet (10 mEq total) by mouth every other day. Or as directed     promethazine 25 MG tablet  Commonly known as:  PHENERGAN  Take 25 mg by mouth every 6 (six) hours as needed for nausea or vomiting.     rivaroxaban 20 MG Tabs tablet  Commonly known as:  XARELTO  Take 1 tablet (20 mg total) by mouth daily.     simvastatin 40 MG tablet  Commonly known as:   ZOCOR  Take 40 mg by mouth every evening.     traMADol 50 MG tablet  Commonly known as:  ULTRAM  Take 100 mg by mouth every 6 (six) hours as needed for moderate pain.        Discharge Orders   Future Appointments Provider Department Dept Phone   05/24/2013 10:00 AM Aris Georgia [place] [facility] [phone]   05/24/2013 10:00 AM [facility] [place] [facility] [phone]   05/24/2013 10:00 AM [facility] [place] [facility] [phone]   06/21/2013 2:00 PM [name], MD [facility] [phone]   Future Orders Complete By Expires   Diet - low sodium heart healthy  As directed    Increase activity slowly  As directed      Follow-up Information   Follow up with [facility]) On 05/24/2013. (Coumadin check, Lab Work and ECG at 10:00 AM)    Specialty:  Cardiology   Contact information:   [address], [place] [address] [phone]      BRING ALL MEDICATIONS WITH YOU TO FOLLOW UP APPOINTMENTS  Time spent with patient to include physician time: 39 min Signed: Lonn Georgia, PA-C 05/19/2013, 12:06 PM Co-Sign MD  [name] MD

## 2013-05-24 ENCOUNTER — Other Ambulatory Visit (INDEPENDENT_AMBULATORY_CARE_PROVIDER_SITE_OTHER): Payer: Medicare HMO

## 2013-05-24 ENCOUNTER — Ambulatory Visit (INDEPENDENT_AMBULATORY_CARE_PROVIDER_SITE_OTHER): Payer: Medicare HMO | Admitting: Pharmacist

## 2013-05-24 VITALS — BP 140/80 | HR 67 | Ht 61.0 in | Wt 205.0 lb

## 2013-05-24 DIAGNOSIS — Z79899 Other long term (current) drug therapy: Secondary | ICD-10-CM

## 2013-05-24 DIAGNOSIS — I4891 Unspecified atrial fibrillation: Secondary | ICD-10-CM

## 2013-05-24 LAB — BASIC METABOLIC PANEL
BUN: 17 mg/dL (ref 6–23)
CALCIUM: 9.9 mg/dL (ref 8.4–10.5)
CO2: 23 meq/L (ref 19–32)
CREATININE: 0.9 mg/dL (ref 0.4–1.2)
Chloride: 99 mEq/L (ref 96–112)
GFR: 62.45 mL/min (ref >60.00)
Glucose, Bld: 114 mg/dL — ABNORMAL HIGH (ref 70–99)
Potassium: 4.8 mEq/L (ref 3.5–5.1)
Sodium: 135 mEq/L (ref 135–145)

## 2013-05-24 LAB — MAGNESIUM: Magnesium: 1.5 mg/dL (ref 1.5–2.5)

## 2013-05-24 MED ORDER — MAGNESIUM OXIDE 400 (241.3 MG) MG PO TABS: 400.0000 mg | ORAL_TABLET | Freq: Three times a day (TID) | ORAL | Status: DC

## 2013-05-24 NOTE — Progress Notes (Signed)
[name] is seen today for Tikosyn follow up.  She presented to the ER on 05/15/13 with chest pressure and dyspnea and was thought to be related to afib with rapid ventricular response.  She also had presyncope with this.  She had a CT of her head done as well, but no acute issues found.  She has been on chronic anticoagulation with Xarelto 20 mg qd and has been compliant with this.  She was ultimately admitted and after evaluation by EP, Tikosyn initiation was determined to be the best option.  Both her potassium and magnesium was low at admission, so she received supplementation of both of these prior to Tikosyn being initiated.  She was on hydrochlorothiazide at admission, and this was ultimately stopped.  She was given Tikosyn 500 mcg q 12 hours and tolerated this well, without any QTc prolongation.  On 05/17/13 she spontaneously converted to NSR with her heart rate in the 60's.  She maintained sinus rhythm during her hospital stay.  She was discharged home on 05/19/13 on Tikosyn dose of 500 mcg bid.  She was also sent home on MagOx 400 mg bid and potassium 10 mEq daily.  She was advised to discontinue HCTZ as well.  Since discharge, patient states she is feeling a little better, but still doesn't have as much energy as she would have expected.  She has been compliant with Tikosyn and hasn't missed any doses.  She hasn't missed any doses of Xarelto either.  She feels the MagOx is making her feel hot, but has been taking this twice daily as recommended.  Will check magnesium and potassium in lab today and determine if doses of these need to be changed.  She also states her medtronic machine at home isn't picking up transmissions so wants to speak with someone about this today.  She brought her machine, and tells me her cell phone signal at her home is poor.  I will have Erasmo Downer speak with patient today.  Patient aware she has a f/u appointment with Dr. Rayann Heman 06/21/13 and will keep this.  EKG from 05/24/13  reviewed by Dr. Rayann Heman.  Normal sinus rhythm with v rate of 67 bpm, QTc 401 msec.    Current Outpatient Prescriptions  Medication Sig Dispense Refill  . ACCU-CHEK SMARTVIEW test strip Use as direceted      . acetaminophen (TYLENOL) 500 MG tablet Take 500 mg by mouth daily as needed for moderate pain.      Marland Kitchen aspirin EC 81 MG EC tablet Take 1 tablet (81 mg total) by mouth daily.      . BD PEN NEEDLE NANO U/F 32G X 4 MM MISC Use as directed      . Cholecalciferol (VITAMIN D3) 1000 UNITS tablet Take 5,000 Units by mouth daily.       . clonazePAM (KLONOPIN) 0.5 MG tablet Take 0.5 mg by mouth 3 (three) times daily as needed for anxiety.       . colchicine 0.6 MG tablet Take 0.6 mg by mouth daily.      Marland Kitchen dofetilide (TIKOSYN) 500 MCG capsule Take 1 capsule (500 mcg total) by mouth 2 (two) times daily.  60 capsule  5  . estradiol (ESTRACE) 1 MG tablet Take 1 mg by mouth daily.      . Exenatide ER (BYDUREON) 2 MG PEN Inject 1 each into the skin every 7 (seven) days. Wednesday      . glipiZIDE (GLUCOTROL XL) 10 MG 24 hr tablet Take 10  mg by mouth daily with breakfast.      . lansoprazole (PREVACID) 30 MG capsule Take 1 capsule (30 mg total) by mouth daily at 12 noon.  30 capsule  11  . levothyroxine (SYNTHROID, LEVOTHROID) 137 MCG tablet Take 137 mcg by mouth daily before breakfast.      . magnesium oxide (MAG-OX) 400 (241.3 MG) MG tablet Take 1 tablet (400 mg total) by mouth 2 (two) times daily.  30 tablet  11  . metFORMIN (GLUCOPHAGE-XR) 500 MG 24 hr tablet Take 500 mg by mouth 2 (two) times daily.      . metoprolol (LOPRESSOR) 50 MG tablet Take 2.5 tablets (125 mg total) by mouth 2 (two) times daily.  150 tablet  5  . potassium chloride (K-DUR) 10 MEQ tablet Take 1 tablet (10 mEq total) by mouth every other day. Or as directed  30 tablet  11  . promethazine (PHENERGAN) 25 MG tablet Take 25 mg by mouth every 6 (six) hours as needed for nausea or vomiting.      . Rivaroxaban (XARELTO) 20 MG TABS Take 1  tablet (20 mg total) by mouth daily.  30 tablet  11  . simvastatin (ZOCOR) 40 MG tablet Take 40 mg by mouth every evening.      . traMADol (ULTRAM) 50 MG tablet Take 100 mg by mouth every 6 (six) hours as needed for moderate pain.       No current facility-administered medications for this visit.   Allergies  Allergen Reactions  . Blueberry Flavor Anaphylaxis  . Januvia [Sitagliptin] Shortness Of Breath  . Lipitor [Atorvastatin] Shortness Of Breath  . Losartan Shortness Of Breath  . Penicillins Anaphylaxis  . Prednisone Anaphylaxis  . Vancomycin Anaphylaxis  . Avelox [Moxifloxacin Hcl In Nacl]   . Cefprozil     REACTION: unknown reaction  . Cetacaine [Butamben-Tetracaine-Benzocaine] Nausea And Vomiting and Swelling  . Cetacaine [Butamben-Tetracaine-Benzocaine]   . Dicyclomine Nausea And Vomiting and Other (See Comments)    Headaches and increased blood sugars  . Dicyclomine Other (See Comments)    "Heart trouble"  . Diltiazem     Nausea and chest pain  . Food     Melons. Bananas.   . Hydrocodone Hives  . Imdur [Isosorbide]     Hives,headache,palpitations  . Losartan Potassium Other (See Comments)    Shortness of breath   . Nitroglycerin     Made patient go into cardiac arrest  . Oxycodone Hives  . Oxycodone   . Avelox [Moxifloxacin] Swelling and Rash

## 2013-05-24 NOTE — Assessment & Plan Note (Addendum)
Patient feeling slightly better since starting Tikosyn, but still having some dyspnea on exertion.  She understands to remain off HCTZ now that she is on Tikosyn.  QTc was good at 401 msec. Reviewed labs, and K- 4.8, Mg- 1.5, and Scr 0.9 mg/dL.  She has been on Mag Ox 400 mg bid since discharge as magnesium was low during hospitalization.  Patient instructed to increase Mag Ox to 400 mg tid and will recheck blood work in 4 weeks when she sees Dr. Rayann Heman.  She is agreeable to this, and will call back if she has any problems.

## 2013-05-25 ENCOUNTER — Ambulatory Visit: Payer: Medicare HMO | Admitting: Pharmacist

## 2013-05-29 ENCOUNTER — Other Ambulatory Visit: Payer: Self-pay | Admitting: *Deleted

## 2013-05-29 MED ORDER — MAGNESIUM OXIDE 400 (241.3 MG) MG PO TABS: 400.0000 mg | ORAL_TABLET | Freq: Three times a day (TID) | ORAL | Status: DC

## 2013-05-31 ENCOUNTER — Encounter: Payer: Self-pay | Admitting: Internal Medicine

## 2013-05-31 ENCOUNTER — Encounter: Payer: Medicare HMO | Admitting: Physician Assistant

## 2013-06-06 ENCOUNTER — Encounter: Payer: Self-pay | Admitting: Cardiology

## 2013-06-15 ENCOUNTER — Encounter: Payer: Self-pay | Admitting: Internal Medicine

## 2013-06-20 ENCOUNTER — Encounter: Payer: Self-pay | Admitting: Cardiology

## 2013-06-20 ENCOUNTER — Other Ambulatory Visit: Payer: Self-pay

## 2013-06-21 ENCOUNTER — Ambulatory Visit (INDEPENDENT_AMBULATORY_CARE_PROVIDER_SITE_OTHER): Payer: Medicare HMO | Admitting: Internal Medicine

## 2013-06-21 ENCOUNTER — Encounter: Payer: Self-pay | Admitting: Internal Medicine

## 2013-06-21 ENCOUNTER — Encounter: Payer: Self-pay | Admitting: Cardiology

## 2013-06-21 ENCOUNTER — Ambulatory Visit (INDEPENDENT_AMBULATORY_CARE_PROVIDER_SITE_OTHER): Payer: Medicare HMO

## 2013-06-21 VITALS — BP 140/79 | HR 67 | Ht 62.0 in | Wt 206.8 lb

## 2013-06-21 DIAGNOSIS — I2510 Atherosclerotic heart disease of native coronary artery without angina pectoris: Secondary | ICD-10-CM

## 2013-06-21 DIAGNOSIS — I480 Paroxysmal atrial fibrillation: Secondary | ICD-10-CM

## 2013-06-21 DIAGNOSIS — R55 Syncope and collapse: Secondary | ICD-10-CM

## 2013-06-21 DIAGNOSIS — I4891 Unspecified atrial fibrillation: Secondary | ICD-10-CM

## 2013-06-21 LAB — MDC_IDC_ENUM_SESS_TYPE_INCLINIC
MDC IDC SESS DTM: 20150527160804
MDC IDC SET ZONE DETECTION INTERVAL: 380 ms
Zone Setting Detection Interval: 2000 ms
Zone Setting Detection Interval: 3000 ms

## 2013-06-21 LAB — BASIC METABOLIC PANEL
BUN: 18 mg/dL (ref 6–23)
CALCIUM: 10 mg/dL (ref 8.4–10.5)
CO2: 31 mEq/L (ref 19–32)
CREATININE: 0.9 mg/dL (ref 0.4–1.2)
Chloride: 99 mEq/L (ref 96–112)
GFR: 64 mL/min (ref >60.00)
Glucose, Bld: 149 mg/dL — ABNORMAL HIGH (ref 70–99)
Potassium: 4.5 mEq/L (ref 3.5–5.1)
Sodium: 139 mEq/L (ref 135–145)

## 2013-06-21 LAB — MAGNESIUM: Magnesium: 1.5 mg/dL (ref 1.5–2.5)

## 2013-06-21 LAB — MDC_IDC_ENUM_SESS_TYPE_REMOTE

## 2013-06-21 NOTE — Patient Instructions (Addendum)
Your physician recommends that you schedule a follow-up appointment as needed with Dr Rayann Heman and in 3 months with Dr [name]  Your physician has recommended you make the following change in your medication:  1) Stop Ranexa 2) Stop Aspirin  Your physician recommends that you return for lab work today: BMP/MAG/
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
## 2013-06-27 ENCOUNTER — Telehealth: Payer: Self-pay | Admitting: Internal Medicine

## 2013-06-27 DIAGNOSIS — R790 Abnormal level of blood mineral: Secondary | ICD-10-CM

## 2013-06-27 NOTE — Telephone Encounter (Signed)
Returned call to patient she stated she was calling to get results of 06/22/13 lab work.Results given.Advised to increase magnesium.Stated she is taking magnesium three times a day.Advised to increase to four times a day.Repeat magnesium in 10 days.Patient stated Dr.Allred stopped her aspirin.Stated she has had diarrhea for the past couple of days since stopping aspirin.Advised to call PCP.Advised stopping aspirin would not cause diarrhea.

## 2013-06-27 NOTE — Telephone Encounter (Signed)
New message    1. Calling for test results.    2. Patient  stating she not feeling well since coming off asa. & having  diarrhea  for 2 1/2 days.

## 2013-07-10 ENCOUNTER — Other Ambulatory Visit: Payer: Self-pay | Admitting: Internal Medicine

## 2013-07-10 ENCOUNTER — Encounter: Payer: Self-pay | Admitting: Internal Medicine

## 2013-07-10 ENCOUNTER — Other Ambulatory Visit: Payer: Medicare HMO

## 2013-07-12 NOTE — Addendum Note (Signed)
Addended by: Sharlot Gowda on: 07/12/2013 01:18 PM   Modules accepted: Level of Service

## 2013-07-13 ENCOUNTER — Encounter: Payer: Self-pay | Admitting: Internal Medicine

## 2013-07-14 LAB — MAGNESIUM: MAGNESIUM: 1.5 mg/dL — AB (ref 1.6–2.6)

## 2013-07-21 ENCOUNTER — Other Ambulatory Visit: Payer: Self-pay

## 2013-07-21 ENCOUNTER — Ambulatory Visit (INDEPENDENT_AMBULATORY_CARE_PROVIDER_SITE_OTHER): Payer: Medicare HMO | Admitting: *Deleted

## 2013-07-21 ENCOUNTER — Telehealth: Payer: Self-pay | Admitting: Physician Assistant

## 2013-07-21 ENCOUNTER — Telehealth: Payer: Self-pay | Admitting: Internal Medicine

## 2013-07-21 DIAGNOSIS — R55 Syncope and collapse: Secondary | ICD-10-CM

## 2013-07-21 MED ORDER — MAGNESIUM OXIDE 400 MG PO CAPS: ORAL_CAPSULE | ORAL | Status: DC

## 2013-07-21 NOTE — Telephone Encounter (Addendum)
Spoke with daughter and she has not been feeling well.  She has not been on DM medication since hospital and says itr was changed at last hospitalization.  I explained to her that Dr Nikki Dom does not follow her BS's, but Dr Altheimer would be the one to discuss her sugars with.  She says she is also having some SOB, swelling in feet as well as stinging in left breast.  Her LINQ monitor shows NSR with last episode of afib on 07/04/13.  I have discussed with Dr [name] and he recommends she follow up with her PCP on Monday and if her symptoms worsen over the weekend could call them and talk to on call or go to the ER

## 2013-07-21 NOTE — Telephone Encounter (Addendum)
Received call from answering service to call back [name] at [phone]. Tried to call back over the span of 15 minutes - on each occasion the number was busy. [name]'s number is listed as emergency contact in the EMR so I called that number back and left message to call us back. [name] finally called back with question about patient's hydrochlorothiazide. She states that Dr. Rayann Heman previously took the patient off of it because she's on Tikosyn. [name] said Dr. Tilden Fossa recently suggested she go go back on it. [name] says she tried to explain to PCP that Dr. Rayann Heman took her off HCTZ for a reason but states PCP suggested she still go back on it. [name] is calling to clarify given conflicting advice. I confirmed that patient should stay off HCTZ (contraindicated with Tikosyn) and talk to PCP about other alternatives. [name] verbalized understanding and gratitude.  Dayna Dunn PA-C

## 2013-07-21 NOTE — Telephone Encounter (Signed)
[name] is concerned regarding medication and her mothers blood sugar is 280. Also having SOB. Please call and advise.

## 2013-07-21 NOTE — Telephone Encounter (Signed)
Patient is calling back very concerned. She would like a call back ASAP! [phone]

## 2013-07-25 NOTE — Progress Notes (Signed)
Loop recorder 

## 2013-07-26 ENCOUNTER — Telehealth: Payer: Self-pay | Admitting: Internal Medicine

## 2013-07-26 NOTE — Telephone Encounter (Signed)
Spoke with patient and let her know I will have a weeks worth of Tikosyn for her at the front desk

## 2013-07-26 NOTE — Telephone Encounter (Signed)
Patient has questions regarding her Tikosyn medication, please call and advise.

## 2013-08-01 ENCOUNTER — Encounter (HOSPITAL_COMMUNITY): Payer: Self-pay | Admitting: Emergency Medicine

## 2013-08-01 ENCOUNTER — Telehealth: Payer: Self-pay | Admitting: Cardiology

## 2013-08-01 ENCOUNTER — Inpatient Hospital Stay (HOSPITAL_COMMUNITY)
Admission: EM | Admit: 2013-08-01 | Discharge: 2013-08-05 | DRG: 292 | Disposition: A | Discharge status: Home / Self Care | Payer: Medicare HMO | Attending: Internal Medicine | Admitting: Internal Medicine

## 2013-08-01 ENCOUNTER — Telehealth: Payer: Self-pay | Admitting: Internal Medicine

## 2013-08-01 ENCOUNTER — Emergency Department (HOSPITAL_COMMUNITY): Payer: Medicare HMO

## 2013-08-01 DIAGNOSIS — E559 Vitamin D deficiency, unspecified: Secondary | ICD-10-CM | POA: Diagnosis present

## 2013-08-01 DIAGNOSIS — R1310 Dysphagia, unspecified: Secondary | ICD-10-CM

## 2013-08-01 DIAGNOSIS — R0789 Other chest pain: Secondary | ICD-10-CM

## 2013-08-01 DIAGNOSIS — Z8249 Family history of ischemic heart disease and other diseases of the circulatory system: Secondary | ICD-10-CM

## 2013-08-01 DIAGNOSIS — R7989 Other specified abnormal findings of blood chemistry: Secondary | ICD-10-CM

## 2013-08-01 DIAGNOSIS — E669 Obesity, unspecified: Secondary | ICD-10-CM | POA: Diagnosis present

## 2013-08-01 DIAGNOSIS — F329 Major depressive disorder, single episode, unspecified: Secondary | ICD-10-CM | POA: Diagnosis present

## 2013-08-01 DIAGNOSIS — Z860100 Personal history of colon polyps, unspecified: Secondary | ICD-10-CM

## 2013-08-01 DIAGNOSIS — J45909 Unspecified asthma, uncomplicated: Secondary | ICD-10-CM | POA: Diagnosis present

## 2013-08-01 DIAGNOSIS — I498 Other specified cardiac arrhythmias: Secondary | ICD-10-CM | POA: Diagnosis present

## 2013-08-01 DIAGNOSIS — R002 Palpitations: Secondary | ICD-10-CM

## 2013-08-01 DIAGNOSIS — Z90710 Acquired absence of both cervix and uterus: Secondary | ICD-10-CM

## 2013-08-01 DIAGNOSIS — I4891 Unspecified atrial fibrillation: Secondary | ICD-10-CM | POA: Diagnosis present

## 2013-08-01 DIAGNOSIS — M1990 Unspecified osteoarthritis, unspecified site: Secondary | ICD-10-CM | POA: Diagnosis present

## 2013-08-01 DIAGNOSIS — E1169 Type 2 diabetes mellitus with other specified complication: Secondary | ICD-10-CM | POA: Diagnosis present

## 2013-08-01 DIAGNOSIS — E876 Hypokalemia: Secondary | ICD-10-CM

## 2013-08-01 DIAGNOSIS — E1142 Type 2 diabetes mellitus with diabetic polyneuropathy: Secondary | ICD-10-CM | POA: Diagnosis present

## 2013-08-01 DIAGNOSIS — E1140 Type 2 diabetes mellitus with diabetic neuropathy, unspecified: Secondary | ICD-10-CM

## 2013-08-01 DIAGNOSIS — Z6837 Body mass index (BMI) 37.0-37.9, adult: Secondary | ICD-10-CM

## 2013-08-01 DIAGNOSIS — I959 Hypotension, unspecified: Secondary | ICD-10-CM

## 2013-08-01 DIAGNOSIS — R0602 Shortness of breath: Secondary | ICD-10-CM

## 2013-08-01 DIAGNOSIS — I428 Other cardiomyopathies: Secondary | ICD-10-CM | POA: Diagnosis present

## 2013-08-01 DIAGNOSIS — F411 Generalized anxiety disorder: Secondary | ICD-10-CM | POA: Diagnosis present

## 2013-08-01 DIAGNOSIS — K219 Gastro-esophageal reflux disease without esophagitis: Secondary | ICD-10-CM | POA: Diagnosis present

## 2013-08-01 DIAGNOSIS — Z880 Allergy status to penicillin: Secondary | ICD-10-CM

## 2013-08-01 DIAGNOSIS — Z833 Family history of diabetes mellitus: Secondary | ICD-10-CM

## 2013-08-01 DIAGNOSIS — E119 Type 2 diabetes mellitus without complications: Secondary | ICD-10-CM

## 2013-08-01 DIAGNOSIS — I480 Paroxysmal atrial fibrillation: Secondary | ICD-10-CM | POA: Diagnosis present

## 2013-08-01 DIAGNOSIS — Z8719 Personal history of other diseases of the digestive system: Secondary | ICD-10-CM

## 2013-08-01 DIAGNOSIS — E785 Hyperlipidemia, unspecified: Secondary | ICD-10-CM | POA: Diagnosis present

## 2013-08-01 DIAGNOSIS — R0609 Other forms of dyspnea: Secondary | ICD-10-CM

## 2013-08-01 DIAGNOSIS — Z7901 Long term (current) use of anticoagulants: Secondary | ICD-10-CM

## 2013-08-01 DIAGNOSIS — Z881 Allergy status to other antibiotic agents status: Secondary | ICD-10-CM

## 2013-08-01 DIAGNOSIS — R000 Tachycardia, unspecified: Secondary | ICD-10-CM

## 2013-08-01 DIAGNOSIS — I5033 Acute on chronic diastolic (congestive) heart failure: Secondary | ICD-10-CM | POA: Diagnosis not present

## 2013-08-01 DIAGNOSIS — E1122 Type 2 diabetes mellitus with diabetic chronic kidney disease: Secondary | ICD-10-CM | POA: Diagnosis present

## 2013-08-01 DIAGNOSIS — R778 Other specified abnormalities of plasma proteins: Secondary | ICD-10-CM

## 2013-08-01 DIAGNOSIS — M5030 Other cervical disc degeneration, unspecified cervical region: Secondary | ICD-10-CM | POA: Diagnosis present

## 2013-08-01 DIAGNOSIS — E039 Hypothyroidism, unspecified: Secondary | ICD-10-CM | POA: Diagnosis present

## 2013-08-01 DIAGNOSIS — I2584 Coronary atherosclerosis due to calcified coronary lesion: Secondary | ICD-10-CM

## 2013-08-01 DIAGNOSIS — I2510 Atherosclerotic heart disease of native coronary artery without angina pectoris: Secondary | ICD-10-CM | POA: Diagnosis present

## 2013-08-01 DIAGNOSIS — I509 Heart failure, unspecified: Secondary | ICD-10-CM | POA: Diagnosis present

## 2013-08-01 DIAGNOSIS — R079 Chest pain, unspecified: Secondary | ICD-10-CM | POA: Diagnosis not present

## 2013-08-01 DIAGNOSIS — F3289 Other specified depressive episodes: Secondary | ICD-10-CM | POA: Diagnosis present

## 2013-08-01 DIAGNOSIS — I50811 Acute right heart failure: Secondary | ICD-10-CM | POA: Diagnosis present

## 2013-08-01 DIAGNOSIS — I6529 Occlusion and stenosis of unspecified carotid artery: Secondary | ICD-10-CM

## 2013-08-01 DIAGNOSIS — Z8674 Personal history of sudden cardiac arrest: Secondary | ICD-10-CM

## 2013-08-01 DIAGNOSIS — I129 Hypertensive chronic kidney disease with stage 1 through stage 4 chronic kidney disease, or unspecified chronic kidney disease: Secondary | ICD-10-CM | POA: Diagnosis present

## 2013-08-01 DIAGNOSIS — E1149 Type 2 diabetes mellitus with other diabetic neurological complication: Secondary | ICD-10-CM | POA: Diagnosis present

## 2013-08-01 DIAGNOSIS — K589 Irritable bowel syndrome without diarrhea: Secondary | ICD-10-CM | POA: Diagnosis present

## 2013-08-01 DIAGNOSIS — I214 Non-ST elevation (NSTEMI) myocardial infarction: Secondary | ICD-10-CM

## 2013-08-01 DIAGNOSIS — I10 Essential (primary) hypertension: Secondary | ICD-10-CM | POA: Diagnosis present

## 2013-08-01 DIAGNOSIS — R001 Bradycardia, unspecified: Secondary | ICD-10-CM | POA: Diagnosis present

## 2013-08-01 HISTORY — DX: Hypertensive heart disease without heart failure: I11.9

## 2013-08-01 LAB — BASIC METABOLIC PANEL
Anion gap: 19 — ABNORMAL HIGH (ref 5–15)
BUN: 14 mg/dL (ref 6–23)
CALCIUM: 9.7 mg/dL (ref 8.4–10.5)
CO2: 23 mEq/L (ref 19–32)
Chloride: 99 mEq/L (ref 96–112)
Creatinine, Ser: 0.79 mg/dL (ref 0.50–1.10)
GFR calc Af Amer: >90 mL/min (ref >90)
GFR, EST NON AFRICAN AMERICAN: 82 mL/min — AB (ref >90)
GLUCOSE: 128 mg/dL — AB (ref 70–99)
Potassium: 4.2 mEq/L (ref 3.7–5.3)
SODIUM: 141 meq/L (ref 137–147)

## 2013-08-01 LAB — CBC
HCT: 37.8 % (ref 36.0–46.0)
HEMOGLOBIN: 11.5 g/dL — AB (ref 12.0–15.0)
MCH: 27 pg (ref 26.0–34.0)
MCHC: 30.4 g/dL (ref 30.0–36.0)
MCV: 88.7 fL (ref 78.0–100.0)
Platelets: 223 10*3/uL (ref 150–400)
RBC: 4.26 MIL/uL (ref 3.87–5.11)
RDW: 16.9 % — ABNORMAL HIGH (ref 11.5–15.5)
WBC: 8 10*3/uL (ref 4.0–10.5)

## 2013-08-01 LAB — I-STAT TROPONIN, ED: Troponin i, poc: 0 ng/mL (ref 0.00–0.08)

## 2013-08-01 LAB — PRO B NATRIURETIC PEPTIDE: PRO B NATRI PEPTIDE: 607.1 pg/mL — AB (ref 0–125)

## 2013-08-01 LAB — PROTIME-INR
INR: 2.67 — AB (ref 0.00–1.49)
Prothrombin Time: 28.4 seconds — ABNORMAL HIGH (ref 11.6–15.2)

## 2013-08-01 NOTE — Telephone Encounter (Signed)
Returned a call to patient's daughter. She informs me that patient is experiencing extreme SOB and fluid retention,edema in her lower extremities. States that her mom tried to "shuck" some corn today and was unable to do so due to becoming so SOB. She  states the edema has been on and off since June 24th. Weight is stable @ 202 lbs. Advises that she will get chest pressure when  Moving around.she has place a call to Dr. Altheimer's office also today because her moms blood glucose is 275. She has not heard back from them. I recommended to patient that her mom may need to go to the ED if she is having extreme SOB associated with some chest discomfort with movement. She declined stating that she would rather Dr. [name] be notified first. If he feels that she needs to go to the  ED then she will take her. I will route the message  To Dr. Debara Pickett (DOD) review and recommendations.

## 2013-08-01 NOTE — Telephone Encounter (Signed)
Spoke with patient's daughter informing her that Dr. Debara Pickett agrees that if her mom is experiencing extremed SOB and chest discomfort with movement then she needs to be evaluated @ the ED. Daughter agreed and will take her for evaluation.

## 2013-08-01 NOTE — Telephone Encounter (Signed)
I agree with your recommendations. We can try to arrange her to see Dr. [name], but if there is no availability, we could offer her to be seen by a mid-level.  Dr. Lemmie Evens

## 2013-08-01 NOTE — Telephone Encounter (Signed)
New message    Daughter calling.   C/O sob, swelling in legs , blood sugar 275. Tired .   Diabetic MD  Is aware of blood sugar reading.

## 2013-08-01 NOTE — Telephone Encounter (Signed)
These issues appear new on chart review. I don't see where we have seen her for swelling or SOB before. No history of clinical CHF. Afib has been controlled well with Tikosyn. These complaints are at odds with findings on Dr. Jackalyn Lombard evaluation in May ( no complaints, no edema). If these issues are ongoing she would need to be seen by [place] to address. I would not change or substitute her meds until she is seen.  Nicholi Ghuman [name] MD, [facility]

## 2013-08-01 NOTE — Telephone Encounter (Signed)
Pt is having some swelling in her legs and shortness of breath.

## 2013-08-01 NOTE — Telephone Encounter (Signed)
Daughter calls today, very upset, b/c   1.) pt swelling in her legs still persists 2.) shortness of breath persists, denies any angina or chest pain 3.) States pcp did not & would not recommend any other medication to take in place of the HCTZ ( states pt saw him 2 weeks ago) 4.) states the Novolog kept her diabetes under control but her diabetes doctor did not want her to be on that & her sugar keeps rising.  States mom was out of breath shucking corn & her feet & legs are still swelling.  Did discuss with daughter about worsening of symptoms pt should go to the ED  She wants to know what other medication Dr. [name] would substitute for the HCTZ  B/c "the primary care doctor keeps referring her back to the cardiologist & this is really frustrating!  I told her I would forward this to Dr. [name]. Horton Chin RN

## 2013-08-01 NOTE — ED Notes (Signed)
Presents with SOB worse with exertion, bilateral ankle swelling and pain in left side of chest intermittently with deep breath described as sharp. SOB began 2 weeks ago.  rest makes SOB better. PAtient of Dr. [name] who told her to come here due to SOB and concern for CHF exacerbation. Bilateral lung sounds diminished. SOB associated with headache and nausea with exertion.

## 2013-08-02 ENCOUNTER — Encounter (HOSPITAL_COMMUNITY): Payer: Self-pay | Admitting: Emergency Medicine

## 2013-08-02 ENCOUNTER — Telehealth: Payer: Self-pay | Admitting: Cardiology

## 2013-08-02 DIAGNOSIS — K589 Irritable bowel syndrome without diarrhea: Secondary | ICD-10-CM | POA: Diagnosis present

## 2013-08-02 DIAGNOSIS — E559 Vitamin D deficiency, unspecified: Secondary | ICD-10-CM | POA: Diagnosis present

## 2013-08-02 DIAGNOSIS — Z880 Allergy status to penicillin: Secondary | ICD-10-CM | POA: Diagnosis not present

## 2013-08-02 DIAGNOSIS — K219 Gastro-esophageal reflux disease without esophagitis: Secondary | ICD-10-CM | POA: Diagnosis present

## 2013-08-02 DIAGNOSIS — E785 Hyperlipidemia, unspecified: Secondary | ICD-10-CM | POA: Diagnosis present

## 2013-08-02 DIAGNOSIS — I4891 Unspecified atrial fibrillation: Secondary | ICD-10-CM | POA: Diagnosis present

## 2013-08-02 DIAGNOSIS — I428 Other cardiomyopathies: Secondary | ICD-10-CM | POA: Diagnosis present

## 2013-08-02 DIAGNOSIS — I50811 Acute right heart failure: Secondary | ICD-10-CM | POA: Diagnosis present

## 2013-08-02 DIAGNOSIS — E669 Obesity, unspecified: Secondary | ICD-10-CM | POA: Diagnosis present

## 2013-08-02 DIAGNOSIS — M1990 Unspecified osteoarthritis, unspecified site: Secondary | ICD-10-CM | POA: Diagnosis present

## 2013-08-02 DIAGNOSIS — J45909 Unspecified asthma, uncomplicated: Secondary | ICD-10-CM | POA: Diagnosis present

## 2013-08-02 DIAGNOSIS — F411 Generalized anxiety disorder: Secondary | ICD-10-CM | POA: Diagnosis present

## 2013-08-02 DIAGNOSIS — Z8249 Family history of ischemic heart disease and other diseases of the circulatory system: Secondary | ICD-10-CM | POA: Diagnosis not present

## 2013-08-02 DIAGNOSIS — E039 Hypothyroidism, unspecified: Secondary | ICD-10-CM | POA: Diagnosis present

## 2013-08-02 DIAGNOSIS — I498 Other specified cardiac arrhythmias: Secondary | ICD-10-CM | POA: Diagnosis present

## 2013-08-02 DIAGNOSIS — I517 Cardiomegaly: Secondary | ICD-10-CM

## 2013-08-02 DIAGNOSIS — I10 Essential (primary) hypertension: Secondary | ICD-10-CM | POA: Diagnosis present

## 2013-08-02 DIAGNOSIS — E1149 Type 2 diabetes mellitus with other diabetic neurological complication: Secondary | ICD-10-CM | POA: Diagnosis present

## 2013-08-02 DIAGNOSIS — R001 Bradycardia, unspecified: Secondary | ICD-10-CM | POA: Diagnosis present

## 2013-08-02 DIAGNOSIS — Z86010 Personal history of colonic polyps: Secondary | ICD-10-CM | POA: Diagnosis not present

## 2013-08-02 DIAGNOSIS — Z881 Allergy status to other antibiotic agents status: Secondary | ICD-10-CM | POA: Diagnosis not present

## 2013-08-02 DIAGNOSIS — Z833 Family history of diabetes mellitus: Secondary | ICD-10-CM | POA: Diagnosis not present

## 2013-08-02 DIAGNOSIS — I2510 Atherosclerotic heart disease of native coronary artery without angina pectoris: Secondary | ICD-10-CM | POA: Diagnosis present

## 2013-08-02 DIAGNOSIS — F3289 Other specified depressive episodes: Secondary | ICD-10-CM | POA: Diagnosis present

## 2013-08-02 DIAGNOSIS — F329 Major depressive disorder, single episode, unspecified: Secondary | ICD-10-CM | POA: Diagnosis present

## 2013-08-02 DIAGNOSIS — Z8674 Personal history of sudden cardiac arrest: Secondary | ICD-10-CM | POA: Diagnosis not present

## 2013-08-02 DIAGNOSIS — R079 Chest pain, unspecified: Secondary | ICD-10-CM | POA: Diagnosis present

## 2013-08-02 DIAGNOSIS — E1142 Type 2 diabetes mellitus with diabetic polyneuropathy: Secondary | ICD-10-CM | POA: Diagnosis present

## 2013-08-02 DIAGNOSIS — R0602 Shortness of breath: Secondary | ICD-10-CM

## 2013-08-02 DIAGNOSIS — I5033 Acute on chronic diastolic (congestive) heart failure: Secondary | ICD-10-CM | POA: Diagnosis present

## 2013-08-02 DIAGNOSIS — I509 Heart failure, unspecified: Secondary | ICD-10-CM | POA: Diagnosis present

## 2013-08-02 DIAGNOSIS — Z7901 Long term (current) use of anticoagulants: Secondary | ICD-10-CM | POA: Diagnosis not present

## 2013-08-02 DIAGNOSIS — Z6837 Body mass index (BMI) 37.0-37.9, adult: Secondary | ICD-10-CM | POA: Diagnosis not present

## 2013-08-02 DIAGNOSIS — Z90710 Acquired absence of both cervix and uterus: Secondary | ICD-10-CM | POA: Diagnosis not present

## 2013-08-02 DIAGNOSIS — M5030 Other cervical disc degeneration, unspecified cervical region: Secondary | ICD-10-CM | POA: Diagnosis present

## 2013-08-02 HISTORY — DX: Acute right heart failure: I50.811

## 2013-08-02 LAB — D-DIMER, QUANTITATIVE (NOT AT ARMC)

## 2013-08-02 LAB — TROPONIN I
Troponin I: <0.3 ng/mL (ref <0.30)
Troponin I: <0.3 ng/mL (ref <0.30)
Troponin I: <0.3 ng/mL (ref <0.30)

## 2013-08-02 LAB — GLUCOSE, CAPILLARY
GLUCOSE-CAPILLARY: 181 mg/dL — AB (ref 70–99)
GLUCOSE-CAPILLARY: 164 mg/dL — AB (ref 70–99)
GLUCOSE-CAPILLARY: 185 mg/dL — AB (ref 70–99)
Glucose-Capillary: 168 mg/dL — ABNORMAL HIGH (ref 70–99)

## 2013-08-02 LAB — HEMOGLOBIN A1C
Hgb A1c MFr Bld: 6.8 % — ABNORMAL HIGH (ref <5.7)
Mean Plasma Glucose: 148 mg/dL — ABNORMAL HIGH (ref <117)

## 2013-08-02 LAB — MAGNESIUM: MAGNESIUM: 1.4 mg/dL — AB (ref 1.5–2.5)

## 2013-08-02 MED ORDER — DIPHENHYDRAMINE HCL 25 MG PO CAPS: 25.0000 mg | ORAL_CAPSULE | Freq: Four times a day (QID) | ORAL | Status: DC | PRN

## 2013-08-02 MED ORDER — INSULIN ASPART 100 UNIT/ML ~~LOC~~ SOLN
0.0000 [IU] | SUBCUTANEOUS | Status: DC
  Administered 2013-08-02 – 2013-08-03 (×5): 2 [IU] via SUBCUTANEOUS

## 2013-08-02 MED ORDER — CLONAZEPAM 0.5 MG PO TABS
0.5000 mg | ORAL_TABLET | Freq: Three times a day (TID) | ORAL | Status: DC | PRN
  Administered 2013-08-04: 0.5 mg via ORAL
  Filled 2013-08-02: qty 1

## 2013-08-02 MED ORDER — POTASSIUM CHLORIDE ER 10 MEQ PO TBCR
10.0000 meq | EXTENDED_RELEASE_TABLET | ORAL | Status: DC
  Administered 2013-08-02 – 2013-08-04 (×2): 10 meq via ORAL
  Filled 2013-08-02 (×2): qty 1

## 2013-08-02 MED ORDER — VITAMIN D3 25 MCG (1000 UNIT) PO TABS
5000.0000 [IU] | ORAL_TABLET | Freq: Every day | ORAL | Status: DC
  Administered 2013-08-02 – 2013-08-05 (×4): 5000 [IU] via ORAL
  Filled 2013-08-02 (×4): qty 5

## 2013-08-02 MED ORDER — EXENATIDE 5 MCG/0.02ML ~~LOC~~ SOPN: 5.0000 ug | PEN_INJECTOR | Freq: Two times a day (BID) | SUBCUTANEOUS | Status: DC

## 2013-08-02 MED ORDER — LEVOTHYROXINE SODIUM 137 MCG PO TABS
137.0000 ug | ORAL_TABLET | Freq: Every day | ORAL | Status: DC
  Administered 2013-08-02 – 2013-08-05 (×4): 137 ug via ORAL
  Filled 2013-08-02 (×5): qty 1

## 2013-08-02 MED ORDER — METFORMIN HCL ER 500 MG PO TB24
500.0000 mg | ORAL_TABLET | Freq: Two times a day (BID) | ORAL | Status: DC
  Administered 2013-08-02 – 2013-08-05 (×6): 500 mg via ORAL
  Filled 2013-08-02 (×8): qty 1

## 2013-08-02 MED ORDER — SODIUM CHLORIDE 0.9 % IJ SOLN
3.0000 mL | Freq: Two times a day (BID) | INTRAMUSCULAR | Status: DC
  Administered 2013-08-02 – 2013-08-05 (×7): 3 mL via INTRAVENOUS

## 2013-08-02 MED ORDER — SODIUM CHLORIDE 0.9 % IV SOLN: 250.0000 mL | INTRAVENOUS | Status: DC | PRN

## 2013-08-02 MED ORDER — KETOROLAC TROMETHAMINE 30 MG/ML IJ SOLN
30.0000 mg | Freq: Once | INTRAMUSCULAR | Status: AC
  Administered 2013-08-02: 30 mg via INTRAVENOUS
  Filled 2013-08-02: qty 1

## 2013-08-02 MED ORDER — COLCHICINE 0.6 MG PO TABS
0.6000 mg | ORAL_TABLET | Freq: Every day | ORAL | Status: DC
  Administered 2013-08-02 – 2013-08-04 (×3): 0.6 mg via ORAL
  Filled 2013-08-02 (×5): qty 1

## 2013-08-02 MED ORDER — MAGNESIUM OXIDE 400 (241.3 MG) MG PO TABS
400.0000 mg | ORAL_TABLET | Freq: Four times a day (QID) | ORAL | Status: DC
  Administered 2013-08-02 – 2013-08-05 (×13): 400 mg via ORAL
  Filled 2013-08-02 (×16): qty 1

## 2013-08-02 MED ORDER — TRAMADOL HCL 50 MG PO TABS
50.0000 mg | ORAL_TABLET | Freq: Four times a day (QID) | ORAL | Status: DC | PRN
  Administered 2013-08-02 – 2013-08-03 (×2): 50 mg via ORAL
  Filled 2013-08-02 (×2): qty 1

## 2013-08-02 MED ORDER — ONDANSETRON HCL 4 MG/2ML IJ SOLN: 4.0000 mg | Freq: Four times a day (QID) | INTRAMUSCULAR | Status: DC | PRN

## 2013-08-02 MED ORDER — PANTOPRAZOLE SODIUM 20 MG PO TBEC
20.0000 mg | DELAYED_RELEASE_TABLET | Freq: Every day | ORAL | Status: DC
  Administered 2013-08-02 – 2013-08-05 (×4): 20 mg via ORAL
  Filled 2013-08-02 (×5): qty 1

## 2013-08-02 MED ORDER — INSULIN GLARGINE 100 UNIT/ML ~~LOC~~ SOLN
5.0000 [IU] | Freq: Every day | SUBCUTANEOUS | Status: DC
  Administered 2013-08-02: 5 [IU] via SUBCUTANEOUS
  Filled 2013-08-02: qty 0.05

## 2013-08-02 MED ORDER — SIMVASTATIN 40 MG PO TABS
40.0000 mg | ORAL_TABLET | Freq: Every evening | ORAL | Status: DC
  Administered 2013-08-02 – 2013-08-04 (×3): 40 mg via ORAL
  Filled 2013-08-02 (×4): qty 1

## 2013-08-02 MED ORDER — DOFETILIDE 500 MCG PO CAPS
500.0000 ug | ORAL_CAPSULE | Freq: Two times a day (BID) | ORAL | Status: DC
  Administered 2013-08-02 – 2013-08-05 (×7): 500 ug via ORAL
  Filled 2013-08-02 (×9): qty 1

## 2013-08-02 MED ORDER — FUROSEMIDE 10 MG/ML IJ SOLN
40.0000 mg | Freq: Two times a day (BID) | INTRAMUSCULAR | Status: DC
  Administered 2013-08-02 – 2013-08-03 (×3): 40 mg via INTRAVENOUS
  Filled 2013-08-02 (×3): qty 4

## 2013-08-02 MED ORDER — ACETAMINOPHEN 325 MG PO TABS: 650.0000 mg | ORAL_TABLET | ORAL | Status: DC | PRN

## 2013-08-02 MED ORDER — GLIPIZIDE ER 10 MG PO TB24
10.0000 mg | ORAL_TABLET | Freq: Every day | ORAL | Status: DC
  Administered 2013-08-03 – 2013-08-05 (×3): 10 mg via ORAL
  Filled 2013-08-02 (×4): qty 1

## 2013-08-02 MED ORDER — RIVAROXABAN 20 MG PO TABS
20.0000 mg | ORAL_TABLET | Freq: Every day | ORAL | Status: DC
  Administered 2013-08-02 – 2013-08-04 (×3): 20 mg via ORAL
  Filled 2013-08-02 (×4): qty 1

## 2013-08-02 MED ORDER — NITROGLYCERIN 2 % TD OINT: 0.5000 [in_us] | TOPICAL_OINTMENT | Freq: Four times a day (QID) | TRANSDERMAL | Status: DC

## 2013-08-02 MED ORDER — METOPROLOL TARTRATE 50 MG PO TABS
50.0000 mg | ORAL_TABLET | Freq: Two times a day (BID) | ORAL | Status: DC
  Administered 2013-08-02 – 2013-08-04 (×5): 50 mg via ORAL
  Filled 2013-08-02 (×6): qty 1

## 2013-08-02 MED ORDER — MAGNESIUM SULFATE 40 MG/ML IJ SOLN
2.0000 g | Freq: Once | INTRAMUSCULAR | Status: AC
  Administered 2013-08-02: 2 g via INTRAVENOUS
  Filled 2013-08-02: qty 50

## 2013-08-02 MED ORDER — SODIUM CHLORIDE 0.9 % IJ SOLN: 3.0000 mL | INTRAMUSCULAR | Status: DC | PRN

## 2013-08-02 MED ORDER — ESTRADIOL 1 MG PO TABS
1.0000 mg | ORAL_TABLET | Freq: Every day | ORAL | Status: DC
  Administered 2013-08-02 – 2013-08-05 (×4): 1 mg via ORAL
  Filled 2013-08-02 (×4): qty 1

## 2013-08-02 NOTE — Progress Notes (Signed)
UR complete.  Rozalynn Buege RN, MSN 
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
## 2013-08-02 NOTE — Progress Notes (Signed)
The patient arrived to 3E22 from the ED at 0540.  She is A&Ox4 and is complaining of 7/10 tenderness in her chest area.  She states that she is coughing, but that it is non-production.  She has dyspnea on exertion.  Her family is at the bedside.  The call bell was explained and was placed within reach.
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
## 2013-08-02 NOTE — Progress Notes (Signed)
Report given to receiving RN. Patient in bed resting. Family at bedside. No signs or symptoms of distress or discomfort noted.

## 2013-08-02 NOTE — Telephone Encounter (Signed)
Left message for ms amos to call back Forwarded to cheryl pugh lpn, dr Doug Sou nurse.

## 2013-08-02 NOTE — Progress Notes (Signed)
Patient admitted after midnight.  Chart reviewed. Pt examined. Troponin and d dimer ok. CP is reproducible with palpation.  Continue diuresis. Try single dose toradol.  Doree Barthel, MD [facility] [phone]

## 2013-08-02 NOTE — Progress Notes (Signed)
*  PRELIMINARY RESULTS* Echocardiogram 2D Echocardiogram has been performed.  Leavy Cella 08/02/2013, 2:37 PM

## 2013-08-02 NOTE — Telephone Encounter (Signed)
Mrs. [name] wanted [place] to know that her mother is in the hospital----she has some questions for Dr. [name].

## 2013-08-03 ENCOUNTER — Encounter (HOSPITAL_COMMUNITY): Payer: Self-pay | Admitting: *Deleted

## 2013-08-03 DIAGNOSIS — E1149 Type 2 diabetes mellitus with other diabetic neurological complication: Secondary | ICD-10-CM

## 2013-08-03 DIAGNOSIS — E1142 Type 2 diabetes mellitus with diabetic polyneuropathy: Secondary | ICD-10-CM

## 2013-08-03 DIAGNOSIS — E669 Obesity, unspecified: Secondary | ICD-10-CM

## 2013-08-03 DIAGNOSIS — I4891 Unspecified atrial fibrillation: Secondary | ICD-10-CM

## 2013-08-03 DIAGNOSIS — I509 Heart failure, unspecified: Secondary | ICD-10-CM

## 2013-08-03 DIAGNOSIS — E039 Hypothyroidism, unspecified: Secondary | ICD-10-CM

## 2013-08-03 DIAGNOSIS — R0609 Other forms of dyspnea: Secondary | ICD-10-CM

## 2013-08-03 DIAGNOSIS — R0989 Other specified symptoms and signs involving the circulatory and respiratory systems: Secondary | ICD-10-CM

## 2013-08-03 DIAGNOSIS — I10 Essential (primary) hypertension: Secondary | ICD-10-CM

## 2013-08-03 DIAGNOSIS — I498 Other specified cardiac arrhythmias: Secondary | ICD-10-CM

## 2013-08-03 DIAGNOSIS — R079 Chest pain, unspecified: Secondary | ICD-10-CM

## 2013-08-03 LAB — GLUCOSE, CAPILLARY
GLUCOSE-CAPILLARY: 171 mg/dL — AB (ref 70–99)
Glucose-Capillary: 183 mg/dL — ABNORMAL HIGH (ref 70–99)
Glucose-Capillary: 210 mg/dL — ABNORMAL HIGH (ref 70–99)
Glucose-Capillary: 119 mg/dL — ABNORMAL HIGH (ref 70–99)

## 2013-08-03 LAB — BASIC METABOLIC PANEL
Anion gap: 16 — ABNORMAL HIGH (ref 5–15)
BUN: 17 mg/dL (ref 6–23)
CO2: 28 meq/L (ref 19–32)
Calcium: 9.6 mg/dL (ref 8.4–10.5)
Chloride: 91 mEq/L — ABNORMAL LOW (ref 96–112)
Creatinine, Ser: 1 mg/dL (ref 0.50–1.10)
GFR calc Af Amer: 65 mL/min — ABNORMAL LOW (ref >90)
GFR, EST NON AFRICAN AMERICAN: 56 mL/min — AB (ref >90)
Glucose, Bld: 180 mg/dL — ABNORMAL HIGH (ref 70–99)
Potassium: 4 mEq/L (ref 3.7–5.3)
Sodium: 135 mEq/L — ABNORMAL LOW (ref 137–147)

## 2013-08-03 LAB — PRO B NATRIURETIC PEPTIDE: Pro B Natriuretic peptide (BNP): 256.8 pg/mL — ABNORMAL HIGH (ref 0–125)

## 2013-08-03 LAB — MAGNESIUM: Magnesium: 1.6 mg/dL (ref 1.5–2.5)

## 2013-08-03 MED ORDER — FUROSEMIDE 40 MG PO TABS
40.0000 mg | ORAL_TABLET | Freq: Every day | ORAL | Status: DC
  Administered 2013-08-04 – 2013-08-05 (×2): 40 mg via ORAL
  Filled 2013-08-03 (×2): qty 1

## 2013-08-03 MED ORDER — INSULIN ASPART 100 UNIT/ML ~~LOC~~ SOLN
0.0000 [IU] | Freq: Three times a day (TID) | SUBCUTANEOUS | Status: DC
  Administered 2013-08-03: 3 [IU] via SUBCUTANEOUS
  Administered 2013-08-03 – 2013-08-05 (×5): 2 [IU] via SUBCUTANEOUS

## 2013-08-03 NOTE — Progress Notes (Signed)
Patient Name: [name] Date of Encounter: 08/03/2013  Principal Problem:   Acute right-sided CHF (congestive heart failure) Active Problems:   HYPOTHYROIDISM   ASTHMA   Chest pain at rest   DM neuropathy, type II diabetes mellitus   PAF (paroxysmal atrial fibrillation)   HTN (hypertension)   Diabetes mellitus without complication   Hyperlipidemia   SOB (shortness of breath)   Bradycardia    Patient Profile:  73 yo caucasian female with past medical history significant for PAF on Xarelto and Tikosyn, DM2, HTN, hypothyroidism, diastolic heart fialure (EF 55-60% 08/02/13), cath 12/2012 w/ non-obs dz, who presented with chest pain, worsening SOB, and swelling of the ankles and feet.  SUBJECTIVE: Better today but still w/ intermittent chest pain, sharp pain going through to her back. Associated SOB, slightly better than yesterday.  Pedal edema improved.  Denies diaphoresis, nausea, vomiting, positional changes in the pain.    Family and patient are frustrated with the coordination of care between her cardiologist and her endocrinologist.  They are confused why some medications are changing and then every 3-4 months she has to be readmitted to the hospital. They also do not remember being told she had CHF.  OBJECTIVE Filed Vitals:   08/02/13 2040 08/03/13 0208 08/03/13 0611 08/03/13 0904  BP: 132/63 113/54 133/62 106/52  Pulse: 58 56 54 55  Temp: 97.6 F (36.4 C) 97.7 F (36.5 C) 97.6 F (36.4 C)   TempSrc: Oral Oral Oral   Resp: 18 20 18    Height:      Weight:   209 lb 14.1 oz (95.2 kg)   SpO2: 97% 95% 93%     Intake/Output Summary (Last 24 hours) at 08/03/13 0936 Last data filed at 08/03/13 0854  Gross per 24 hour  Intake    500 ml  Output   2550 ml  Net  -2050 ml   Filed Weights   08/01/13 1915 08/02/13 0826 08/03/13 0611  Weight: 206 lb (93.441 kg) 211 lb 14.4 oz (96.117 kg) 209 lb 14.1 oz (95.2 kg)    PHYSICAL EXAM General: Well developed, obese,  female in no acute distress sitting at the edge of the bed. Head: Normocephalic, atraumatic.  Neck: Supple without bruits, no JVD. Lungs:  Resp regular and unlabored, CTA. Able to speak in full sentences. Heart: RRR, S1, S2, no S3, S4, or murmur; no rub. Abdomen: Soft, non-tender, non-distended, BS + x 4.  Extremities: No clubbing, cyanosis, Trace pedal edema bilaterally.  Neuro: Alert and oriented X 3. Moves all extremities spontaneously. Psych: easily gets anxious, agitated.  LABS: CBC: Recent Labs  08/01/13 1940  WBC 8.0  HGB 11.5*  HCT 37.8  MCV 88.7  PLT 223   INR: Recent Labs  08/01/13 1940  INR [id number]*   Basic Metabolic Panel: Recent Labs  08/01/13 1940 08/02/13 1005 08/03/13 0259  NA 141  --  135*  K 4.2  --  4.0  CL 99  --  91*  CO2 23  --  28  GLUCOSE 128*  --  180*  BUN 14  --  17  CREATININE 0.79  --  1.00  CALCIUM 9.7  --  9.6  MG  --  1.4*  --    Cardiac Enzymes: Recent Labs  08/02/13 1005 08/02/13 1301 08/02/13 1823  TROPONINI <0.30 <0.30 <0.30    Recent Labs  08/01/13 1954  TROPIPOC 0.00   BNP: Pro B Natriuretic peptide (BNP)  Date/Time Value Ref Range  Status  08/01/2013  7:40 PM 607.1* 0 - 125 pg/mL Final  05/15/2013  3:20 PM 549.5* 0 - 125 pg/mL Final   D-dimer: Recent Labs  08/02/13 0030  DDIMER <0.27   Hemoglobin A1C: Recent Labs  08/02/13 1005  HGBA1C 6.8*   Lab Results  Component Value Date   TSH 0.534 05/15/2013   TELE:  NSR. Sinus brady, occasionally in the high 40s while asleep  ECG: NSR with no acute changes 08/01/13  Radiology/Studies: Dg Chest 2 View 08/01/2013   CLINICAL DATA:  Shortness of breath, chest heaviness.  EXAM: CHEST  2 VIEW  COMPARISON:  Chest radiograph May 15, 2013  FINDINGS: The cardiac silhouette appears moderately enlarged, similar. Mediastinal silhouette is nonsuspicious. Electronic device projects at left heart border. No pleural effusions or focal consolidations. Scarring left lung base. No  pneumothorax. Soft tissue planes and included osseous structures are nonsuspicious.  IMPRESSION: Stable appearance of the chest: Cardiomegaly and left lung base scarring.   Electronically Signed   By: Elon Alas   On: 08/01/2013 20:08   2D Echocardiogram without contrast 08/02/13 Study Conclusions - Left ventricle: The cavity size was normal. There was mild concentric hypertrophy. Systolic function was normal. The estimated ejection fraction was in the range of 55% to 60%. Wall motion was normal; there were no regional wall motion abnormalities. Doppler parameters are consistent with abnormal left ventricular relaxation (grade 1 diastolic dysfunction). - Mitral valve: Calcified annulus. There was trivial regurgitation. - Atrial septum: No defect or patent foramen ovale was identified. Impressions: - Compared to the prior echo in 01/2012, EF has normalized. There is diastolic dysfunction with indeterminate filling pressure. Read by Odetta Pink MD   Current Medications:  . cholecalciferol  5,000 Units Oral Daily  . colchicine  0.6 mg Oral q1800  . dofetilide  500 mcg Oral BID  . estradiol  1 mg Oral Daily  . furosemide  40 mg Intravenous Q12H  . glipiZIDE  10 mg Oral Q breakfast  . insulin aspart  0-9 Units Subcutaneous TID WC  . levothyroxine  137 mcg Oral QAC breakfast  . magnesium oxide  400 mg Oral QID  . metFORMIN  500 mg Oral BID WC  . metoprolol tartrate  50 mg Oral BID  . pantoprazole  20 mg Oral Daily  . potassium chloride  10 mEq Oral QODAY  . rivaroxaban  20 mg Oral Q supper  . simvastatin  40 mg Oral QPM  . sodium chloride  3 mL Intravenous Q12H   ASSESSMENT AND PLAN: 73 yo caucasian female with past medical history significant for PAF on Xarelto and Tikosyn, DM2, HTN, hypothyroidism, CHF who presented with chest pain, worsening SOB and swelling of the ankles and feet.  Principal Problem:   Acute right-sided CHF (congestive heart failure): BNP 607.1 in ED.  Volume status appears improved.  Decreased SOB, minimal edema, no JVD, lungs clear, net output -3660 since admit.  Recheck BNP today, if it is trending down she can be transitioned to PO lasix. Cr and BUN are slowly elevating.  Cr 0.79 yesterday and 1.0 today. GFR 82 yesterday and 56 today. Recheck in am.  Active Problems:   HYPOTHYROIDISM - per IM, TSH OK in April    ASTHMA - per IM, no wheezing now    Chest pain at rest:-   Serial troponin q6h x 3 were negative. Continue cardiac monitoring but cath 12/2012 w/ non-obs dz, no further workup. NSR on telemetry. PRN rx  DM neuropathy, type II diabetes mellitus: per IM    PAF (paroxysmal atrial fibrillation): continue Tikosyn and Xarelto, maintaining SR    HTN (hypertension) - SBP 100s-130s on current Rx, per IM    Diabetes mellitus without complication: glucose has been high in the 180s. Per IM    Hyperlipidemia: continue simvastatin, per IM    Bradycardia: Asymptomatic, continue 50 mg metoprolol BID. PTA dose was 125 mg BID, but would continue 50 mg BID.    Hypomagnesemia - will check now, may need IV Rx since on Tikosyn  Plan - possible d/c in am from a cardiac standpoint.  Signed, Anson Crofts, PA-S   Signed, Rosaria Ferries , PA-C 9:37 AM 08/03/2013

## 2013-08-03 NOTE — Progress Notes (Signed)
[facility] PROGRESS NOTE  CHIHIRO GANZER [id number] DOB: August 29, 1940 DOA: 08/01/2013 PCP: Octavio Graves, DO  Assessment/Plan:  Principal Problem:   Acute diastolic CHF (congestive heart failure): improving. Per cardiology. LVEF improved. Active Problems:   HYPOTHYROIDISM   ASTHMA   Chest pain at rest, atypical. Likely musculoskeletal   DM neuropathy, type II diabetes mellitus: long discussion with daughter by phone 7/8. She is displeased with endocrinologist because blood glucoses at home always higher than when in hospital when on insulin and she had requested novolog be started.  Have resumed home medications.  hgb a1c not bad at 6.8, but CBGs 160 - 180 here. Would be reasonable to start lantus or novolog SSI at home. Daughter requesting referral to an alternate endocrinologist.   PAF (paroxysmal atrial fibrillation)   HTN (hypertension)   Hyperlipidemia   Bradycardia Hypomagnesemia: recheck after 2 gm iv  Code Status:  full Family Communication:   Disposition Plan:  home  HPI/Subjective: Breathing easier. CP "comes and goes". Edema better.  Objective: Filed Vitals:   08/03/13 0904  BP: 106/52  Pulse: 55  Temp:   Resp:     Intake/Output Summary (Last 24 hours) at 08/03/13 1136 Last data filed at 08/03/13 1055  Gross per 24 hour  Intake    500 ml  Output   2800 ml  Net  -2300 ml   Filed Weights   08/01/13 1915 08/02/13 0826 08/03/13 0611  Weight: 93.441 kg (206 lb) 96.117 kg (211 lb 14.4 oz) 95.2 kg (209 lb 14.1 oz)    Exam:   General:  comfortable  Cardiovascular: RRR without MGR  Respiratory: CTA without WRR  Abdomen: obese, s, nt, nd  Ext: no CCE  Basic Metabolic Panel:  Recent Labs Lab 08/01/13 1940 08/02/13 1005 08/03/13 0259  NA 141  --  135*  K 4.2  --  4.0  CL 99  --  91*  CO2 23  --  28  GLUCOSE 128*  --  180*  BUN 14  --  17  CREATININE 0.79  --  1.00  CALCIUM 9.7  --  9.6  MG  --  1.4*  --    Liver Function  Tests: No results found for this basename: AST, ALT, ALKPHOS, BILITOT, PROT, ALBUMIN,  in the last 168 hours No results found for this basename: LIPASE, AMYLASE,  in the last 168 hours No results found for this basename: AMMONIA,  in the last 168 hours CBC:  Recent Labs Lab 08/01/13 1940  WBC 8.0  HGB 11.5*  HCT 37.8  MCV 88.7  PLT 223   Cardiac Enzymes:  Recent Labs Lab 08/02/13 1005 08/02/13 1301 08/02/13 1823  TROPONINI <0.30 <0.30 <0.30   BNP (last 3 results)  Recent Labs  01/08/13 1405 05/15/13 1520 08/01/13 1940  PROBNP 373.8* 549.5* 607.1*   CBG:  Recent Labs Lab 08/02/13 1154 08/02/13 1631 08/02/13 2139 08/03/13 0611 08/03/13 1120  GLUCAP 168* 164* 185* 183* 210*    No results found for this or any previous visit (from the past 240 hour(s)).   Studies: Dg Chest 2 View  08/01/2013   CLINICAL DATA:  Shortness of breath, chest heaviness.  EXAM: CHEST  2 VIEW  COMPARISON:  Chest radiograph May 15, 2013  FINDINGS: The cardiac silhouette appears moderately enlarged, similar. Mediastinal silhouette is nonsuspicious. Electronic device projects at left heart border. No pleural effusions or focal consolidations. Scarring left lung base. No pneumothorax. Soft tissue planes and included osseous structures are nonsuspicious.  IMPRESSION: Stable appearance of the chest: Cardiomegaly and left lung base scarring.   Electronically Signed   By: Elon Alas   On: 08/01/2013 20:08   Echo Left ventricle: The cavity size was normal. There was mild concentric hypertrophy. Systolic function was normal. The estimated ejection fraction was in the range of 55% to 60%. Wall motion was normal; there were no regional wall motion abnormalities. Doppler parameters are consistent with abnormal left ventricular relaxation (grade 1 diastolic dysfunction). - Mitral valve: Calcified annulus. There was trivial regurgitation. - Atrial septum: No defect or patent foramen ovale was  identified.  Impressions:  - Compared to the prior echo in 01/2012, EF has normalized. There is diastolic dysfunction with indeterminate filling pressure.   Scheduled Meds: . cholecalciferol  5,000 Units Oral Daily  . colchicine  0.6 mg Oral q1800  . dofetilide  500 mcg Oral BID  . estradiol  1 mg Oral Daily  . furosemide  40 mg Intravenous Q12H  . glipiZIDE  10 mg Oral Q breakfast  . insulin aspart  0-9 Units Subcutaneous TID WC  . levothyroxine  137 mcg Oral QAC breakfast  . magnesium oxide  400 mg Oral QID  . metFORMIN  500 mg Oral BID WC  . metoprolol tartrate  50 mg Oral BID  . pantoprazole  20 mg Oral Daily  . potassium chloride  10 mEq Oral QODAY  . rivaroxaban  20 mg Oral Q supper  . simvastatin  40 mg Oral QPM  . sodium chloride  3 mL Intravenous Q12H   Continuous Infusions:   Time spent: 25 minutes  [facility] Pager [phone]. If 7PM-7AM, please contact night-coverage at [email or web address], password [email or web address] 08/03/2013, 11:36 AM  LOS: 2 days

## 2013-08-03 NOTE — Progress Notes (Signed)
Report given to receiving RN. Patient in bed resting. No signs or symptoms of distress or discomfort noted.  

## 2013-08-03 NOTE — Progress Notes (Signed)
BNP significantly improved.  Will change to PO Lasix today.

## 2013-08-03 NOTE — Progress Notes (Signed)
Patient seen and examined and agree with note as outlined by Rosaria Ferries, PA-C.  SOB has improved.  Will check a BNP and if trending downward then change to PO Lasix.  No further ischemic w/u given recent cath with nonobstructive CAD.

## 2013-08-04 DIAGNOSIS — I2584 Coronary atherosclerosis due to calcified coronary lesion: Secondary | ICD-10-CM

## 2013-08-04 DIAGNOSIS — E119 Type 2 diabetes mellitus without complications: Secondary | ICD-10-CM

## 2013-08-04 DIAGNOSIS — I2510 Atherosclerotic heart disease of native coronary artery without angina pectoris: Secondary | ICD-10-CM

## 2013-08-04 LAB — BASIC METABOLIC PANEL
ANION GAP: 18 — AB (ref 5–15)
BUN: 19 mg/dL (ref 6–23)
CALCIUM: 9.9 mg/dL (ref 8.4–10.5)
CO2: 28 mEq/L (ref 19–32)
CREATININE: 0.9 mg/dL (ref 0.50–1.10)
Chloride: 91 mEq/L — ABNORMAL LOW (ref 96–112)
GFR calc Af Amer: 73 mL/min — ABNORMAL LOW (ref >90)
GFR, EST NON AFRICAN AMERICAN: 63 mL/min — AB (ref >90)
Glucose, Bld: 180 mg/dL — ABNORMAL HIGH (ref 70–99)
Potassium: 4.3 mEq/L (ref 3.7–5.3)
Sodium: 137 mEq/L (ref 137–147)

## 2013-08-04 LAB — GLUCOSE, CAPILLARY
GLUCOSE-CAPILLARY: 196 mg/dL — AB (ref 70–99)
Glucose-Capillary: 182 mg/dL — ABNORMAL HIGH (ref 70–99)
Glucose-Capillary: 175 mg/dL — ABNORMAL HIGH (ref 70–99)
Glucose-Capillary: 162 mg/dL — ABNORMAL HIGH (ref 70–99)

## 2013-08-04 LAB — MAGNESIUM: Magnesium: 1.6 mg/dL (ref 1.5–2.5)

## 2013-08-04 MED ORDER — INSULIN GLARGINE 100 UNIT/ML ~~LOC~~ SOLN
10.0000 [IU] | Freq: Every day | SUBCUTANEOUS | Status: DC
  Administered 2013-08-04: 10 [IU] via SUBCUTANEOUS
  Filled 2013-08-04 (×2): qty 0.1

## 2013-08-04 MED ORDER — METOPROLOL TARTRATE 50 MG PO TABS: 25.0000 mg | ORAL_TABLET | Freq: Two times a day (BID) | ORAL | Status: DC

## 2013-08-04 MED ORDER — FUROSEMIDE 40 MG PO TABS: 40.0000 mg | ORAL_TABLET | Freq: Every day | ORAL | Status: DC

## 2013-08-04 MED ORDER — METOPROLOL TARTRATE 25 MG PO TABS
25.0000 mg | ORAL_TABLET | Freq: Two times a day (BID) | ORAL | Status: DC
  Administered 2013-08-04 – 2013-08-05 (×2): 25 mg via ORAL
  Filled 2013-08-04 (×3): qty 1

## 2013-08-04 MED ORDER — SIMETHICONE 80 MG PO CHEW
80.0000 mg | CHEWABLE_TABLET | Freq: Four times a day (QID) | ORAL | Status: DC | PRN
  Administered 2013-08-04: 80 mg via ORAL
  Filled 2013-08-04 (×2): qty 1

## 2013-08-04 NOTE — Progress Notes (Signed)
. 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 BNP on arrival was 607.1.    Subjective: Breathing has improved.   Objective: Vital signs in last 24 hours: Temp:  [97.6 F (36.4 C)-98.3 F (36.8 C)] 97.6 F (36.4 C) (07/10 0648) Pulse Rate:  [52-58] 52 (07/10 0648) Resp:  [18-20] 18 (07/10 0648) BP: (96-130)/(54-61) 96/54 mmHg (07/10 0648) SpO2:  [94 %-96 %] 96 % (07/10 0648) Weight:  [208 lb 14.4 oz (94.756 kg)] 208 lb 14.4 oz (94.756 kg) (07/10 0648) Last BM Date: 08/03/13  Intake/Output from previous day: 07/09 0701 - 07/10 0700 In: 740 [P.O.:740] Out: 1600 [Urine:1600] Intake/Output this shift: Total I/O In: 240 [P.O.:240] Out: -   Medications Current Facility-Administered Medications  Medication Dose Route Frequency Provider Last Rate Last Dose  . 0.9 %  sodium chloride infusion  250 mL Intravenous PRN Theressa Millard, MD      . acetaminophen (TYLENOL) tablet 650 mg  650 mg Oral Q4H PRN Theressa Millard, MD      . cholecalciferol (VITAMIN D) tablet 5,000 Units  5,000 Units Oral Daily Theressa Millard, MD   5,000 Units at 08/03/13 [id number]  . clonazePAM (KLONOPIN) tablet 0.5 mg  0.5 mg Oral TID PRN Theressa Millard, MD      . colchicine tablet 0.6 mg  0.6 mg Oral q1800 Theressa Millard, MD   0.6 mg at 08/03/13 1656  . diphenhydrAMINE (BENADRYL) capsule 25 mg  25 mg Oral Q6H PRN Theressa Millard, MD      . dofetilide [facility]) capsule 500 mcg  500 mcg Oral BID Theressa Millard, MD   500 mcg at 08/03/13 1952  . estradiol (ESTRACE) tablet 1 mg  1 mg Oral Daily Theressa Millard, MD   1 mg at 08/03/13 0904  . furosemide (LASIX) tablet 40 mg  40 mg Oral Daily Sueanne Margarita, MD      . glipiZIDE (GLUCOTROL XL) 24 hr tablet 10 mg  10 mg Oral Q breakfast Delfina Redwood, MD   10 mg at 08/04/13 [id number]  . insulin aspart (novoLOG) injection  0-9 Units  0-9 Units Subcutaneous TID WC Delfina Redwood, MD   2 Units at 08/04/13 [phone]  . levothyroxine (SYNTHROID, LEVOTHROID) tablet 137 mcg  137 mcg Oral QAC breakfast Theressa Millard, MD   137 mcg at 08/04/13 [phone]  . magnesium oxide (MAG-OX) tablet 400 mg  400 mg Oral QID Theressa Millard, MD   400 mg at 08/03/13 2122  . metFORMIN (GLUCOPHAGE-XR) 24 hr tablet 500 mg  500 mg Oral BID WC Delfina Redwood, MD   500 mg at 08/04/13 [id number]  . metoprolol (LOPRESSOR) tablet 50 mg  50 mg Oral BID Theressa Millard, MD   50 mg at 08/03/13 2122  . ondansetron (ZOFRAN) injection 4 mg  4 mg Intravenous Q6H PRN Theressa Millard, MD      . pantoprazole (PROTONIX) EC tablet 20 mg  20 mg Oral Daily Theressa Millard, MD   20 mg at 08/03/13 0950  . potassium chloride (K-DUR) CR tablet 10 mEq  10 mEq Oral QODAY Theressa Millard, MD   10 mEq at 08/02/13 0928  . rivaroxaban (XARELTO) tablet 20 mg  20 mg Oral Q supper Theressa Millard, MD   20 mg at 08/03/13 1657  . simvastatin (ZOCOR) tablet  40 mg  40 mg Oral QPM Theressa Millard, MD   40 mg at 08/03/13 1656  . sodium chloride 0.9 % injection 3 mL  3 mL Intravenous Q12H Theressa Millard, MD   3 mL at 08/03/13 2122  . sodium chloride 0.9 % injection 3 mL  3 mL Intravenous PRN Theressa Millard, MD      . traMADol [facility]at[facility]bon) tablet 50-100 mg  50-100 mg Oral Q6H PRN Delfina Redwood, MD   50 mg at 08/03/13 1505    PE: General appearance: alert, cooperative, no distress and morbidly obese Lungs: clear to auscultation bilaterally Heart: regular rate and rhythm Extremities: no LEE Pulses: 2+ and symmetric Skin: warm and dry Neurologic: Grossly normal  Lab Results:   Recent Labs  08/01/13 1940  WBC 8.0  HGB 11.5*  HCT 37.8  PLT 223   BMET  Recent Labs  08/01/13 1940 08/03/13 0259 08/04/13 0500  NA 141 135* 137  K 4.2 4.0 4.3  CL 99 91* 91*  CO2 23 28 28   GLUCOSE 128* 180* 180*  BUN 14 17 19   CREATININE 0.79 1.00 0.90    CALCIUM 9.7 9.6 9.9   PT/INR  Recent Labs  08/01/13 1940  LABPROT 28.4*  INR 2.67*   Filed Weights   08/02/13 0826 08/03/13 0611 08/04/13 0648  Weight: 211 lb 14.4 oz (96.117 kg) 209 lb 14.1 oz (95.2 kg) 208 lb 14.4 oz (94.756 kg)     Assessment/Plan    Principal Problem:   Acute right-sided CHF (congestive heart failure) Active Problems:   HYPOTHYROIDISM   ASTHMA   Chest pain at rest   DM neuropathy, type II diabetes mellitus   PAF (paroxysmal atrial fibrillation)   HTN (hypertension)   Diabetes mellitus without complication   Hyperlipidemia   SOB (shortness of breath)   Bradycardia  1. Acute/Chronic CHF: breathing has improved. Good diuresis, net negative 4.3L. BNP improved. Lungs are CTAB. No peripheral edema. She was transitioned to PO Lasix yesterday. Renal function stable. Continue lasix, and BB.   2. PAF: continue Tikoysn, BB and Xarelto.   Stable from a cardiac standpoint.   LOS: 3 days    Brittainy M. Rosita Fire, PA-C 08/04/2013 9:14 AM

## 2013-08-04 NOTE — Telephone Encounter (Signed)
Returned call to patient's daughter [name] Wednesday 08/02/13.She stated she had several questions for Dr.Jordan.Stated her mother is at [facility] and she would like Dr.Jordan to see her.Advised Dr.Jordan is not scheduled to round at [facility] this week,I will let him know she is in hospital.

## 2013-08-04 NOTE — Progress Notes (Addendum)
Insulin teaching completed and demonstrated, family members at bedside during teaching. Will make receiving RN aware.

## 2013-08-04 NOTE — Progress Notes (Addendum)
Patient seen and examined and agree with note as outlined by Lyda Jester PA-C.  CHF appears resolved and she appears euvolemic on exam.  Continue PO lasix/BB.  She is bradycardic and HR has been running in the upper 40's and low 50's and BP is soft.  Will decrease Lopressor to 25mg  BID.

## 2013-08-04 NOTE — Progress Notes (Signed)
Inpatient Diabetes Program Recommendations  AACE/ADA: New Consensus Statement on Inpatient Glycemic Control (2013)  Target Ranges:  Prepandial:   less than 140 mg/dL      Peak postprandial:   less than 180 mg/dL (1-2 hours)      Critically ill patients:  140 - 180 mg/dL   Reason for Visit: Hyperglycemia  Diabetes history: DM2 Outpatient Diabetes medications: Byetta 5 mcg bid, glipizide 10 mg QAM, metformin 500 mg bid Current orders for Inpatient glycemic control: metformin 500 bid, glipizide 10 mg QAM, Novolog  Inpatient Diabetes Program Recommendations Insulin - Basal: Consider addition of Lantus 10 units QHS  Note: Will continue to follow. Thank you. Lorenda Peck, RD, LDN, CDE Inpatient Diabetes Coordinator [phone]

## 2013-08-04 NOTE — Progress Notes (Signed)
Report given to receiving RN. Patient in bed resting. Family at bedside. No signs or symptoms of distress or discomfort noted.

## 2013-08-04 NOTE — Progress Notes (Signed)
Diabetes Self-Management Education   08/04/2013 [name] is a 73 y.o. female with Type 2 diabetes  .  Other people present during visit: son-in-law.     ASSESSMENT  Patient Concerns:   Wants to make sure she has a good understanding of how to inject insulin before she is discharged.  Blood pressure 118/56, pulse 60, temperature 97.6 F (36.4 C), temperature source Oral, resp. rate 18, height 5\' 2"  (1.575 m), weight 208 lb 14.4 oz (94.756 kg), SpO2 96.00%. Body mass index is 38.2 kg/(m^2).  Lab Results: Hemoglobin A1C  Date Value Ref Range Status  08/02/2013 6.8* <5.7 % Final     (NOTE)                                                                               According to the ADA Clinical Practice Recommendations for 2011, when     HbA1c is used as a screening test:      >=6.5%   Diagnostic of Diabetes Mellitus               (if abnormal result is confirmed)     5.7-6.4%   Increased risk of developing Diabetes Mellitus     References:Diagnosis and Classification of Diabetes Mellitus,Diabetes     [id number] 1):S62-S69 and Standards of Medical Care in             Diabetes - 2011,Diabetes [id number] (Suppl 1):S11-S61.     Family History  Problem Relation Age of Onset  . Heart attack Mother   . Diabetes Mother   . Colon cancer Father   . Esophageal cancer Father   . Kidney cancer Father   . Diabetes Father   . Ovarian cancer Sister   . Liver cancer Sister   . Breast cancer Sister   . Colon cancer Son   . Colon polyps Son   . Diabetes Sister   . Irritable bowel syndrome Sister   . Myocarditis Brother   . Rectal cancer Neg Hx   . Stomach cancer Neg Hx    History  Substance Use Topics  . Smoking status: Never Smoker   . Smokeless tobacco: Not on file  . Alcohol Use: No    Support Systems:   Daughter, son-in-law and autistic son who is 59yrs old and lives with her.    Prior DM Education:  yes   Patient Belief / Attitude about Diabetes:   Positive  attitude.  States she has had diabetes since 1998 or 1999.  Has a meter at home and checks daily.  She was able to identify symptoms of low blood sugars, and the treatment of it. Uses 1/3 cup juice at home and carries glucose tablets with her when she goes out to treat low blood sugars. Has used a variety of oral meds, Byetta and Bydureon in the past.   Assessment comments: Eats three meals a day although she often eats oatmeal for all meals and an egg white sandwich.  Occasionally drinks Glucerna at bedtime. States she's "allergic to peanut butter and doesn't like cheese".  [name] has given herself allergy shots in the past.    Education Topics Reviewed with Patient Today:  Topic Points  Discussed  Disease State  Physiology; the role of the pancreas, the cells and the gut on blood sugar regulation  Nutrition Management  Stressed the importance of 3 meals a day and a bedtime snack including protein and carbohydrates at each meal. Pamphlet given with meal planning shown.       Medications  Metformin, Glipizide, meal time insulin and Lantus insulin.  Mechanism of action, potential side effects, storage, longevity of insulin. Patient was able to verbalize how to draw up and administer insulin.  I taught her a vial and syringe but we examined the pen as well.  She feels like the pen might be a better option for her because it is easier, easier to read the numbers and she already has pens for her Byetta.   Monitoring  Has a meter- takes her meter to MD visits for downloaded each visit.  Acute Complications  Hypoglycemia and Hyperglycemia, symptoms, causes and treatment.     Self-Care Barriers:   none  Education material provided: Type 2 diabetes and Adding Insulin  [name] 08/04/2013 2:25 PM

## 2013-08-04 NOTE — Progress Notes (Signed)
[facility] PROGRESS NOTE  [name] [id number] DOB: September 23, 1940 DOA: 08/01/2013 PCP: Octavio Graves, DO  Assessment/Plan:  Principal Problem:   Acute diastolic CHF (congestive heart failure): euvolemic. Active Problems:   HYPOTHYROIDISM   ASTHMA   Chest pain at rest, atypical. Likely musculoskeletal   DM neuropathy, type II diabetes mellitus: long discussion again with daughter and patient. She is agreeable to lantus at home will have RN and DM educator to give insulin injection instructions   PAF (paroxysmal atrial fibrillation)   HTN (hypertension)   Hyperlipidemia   Bradycardia: cardiology has decreased metoprolol dose. Hypomagnesemia: 1.6 today.  Will monitor 24 hours, then home in am if stable  Code Status:  full Family Communication:  Daughter at bedside Disposition Plan:  home  HPI/Subjective: No dyspnea or edema. No dizziness. Has been ambulating in room without difficulty  Objective: Filed Vitals:   08/04/13 0945  BP: 118/56  Pulse: 60  Temp:   Resp:     Intake/Output Summary (Last 24 hours) at 08/04/13 1311 Last data filed at 08/04/13 0832  Gross per 24 hour  Intake    680 ml  Output   1350 ml  Net   -670 ml   Filed Weights   08/02/13 0826 08/03/13 0611 08/04/13 0648  Weight: 96.117 kg (211 lb 14.4 oz) 95.2 kg (209 lb 14.1 oz) 94.756 kg (208 lb 14.4 oz)   Tele: SB  Exam:   General:  comfortable  Cardiovascular: slow, regular without MGR  Respiratory: CTA without WRR  Abdomen: obese, s, nt, nd  Ext: no CCE  Basic Metabolic Panel:  Recent Labs Lab 08/01/13 1940 08/02/13 1005 08/03/13 0259 08/03/13 1104 08/04/13 0500  NA 141  --  135*  --  137  K 4.2  --  4.0  --  4.3  CL 99  --  91*  --  91*  CO2 23  --  28  --  28  GLUCOSE 128*  --  180*  --  180*  BUN 14  --  17  --  19  CREATININE 0.79  --  1.00  --  0.90  CALCIUM 9.7  --  9.6  --  9.9  MG  --  1.4*  --  1.6 1.6   Liver Function Tests: No results found  for this basename: AST, ALT, ALKPHOS, BILITOT, PROT, ALBUMIN,  in the last 168 hours No results found for this basename: LIPASE, AMYLASE,  in the last 168 hours No results found for this basename: AMMONIA,  in the last 168 hours CBC:  Recent Labs Lab 08/01/13 1940  WBC 8.0  HGB 11.5*  HCT 37.8  MCV 88.7  PLT 223   Cardiac Enzymes:  Recent Labs Lab 08/02/13 1005 08/02/13 1301 08/02/13 1823  TROPONINI <0.30 <0.30 <0.30   BNP (last 3 results)  Recent Labs  05/15/13 1520 08/01/13 1940 08/03/13 1058  PROBNP 549.5* 607.1* 256.8*   CBG:  Recent Labs Lab 08/03/13 1120 08/03/13 1644 08/03/13 2111 08/04/13 0644 08/04/13 1119  GLUCAP 210* 171* 119* 182* 196*    No results found for this or any previous visit (from the past 240 hour(s)).   Studies: No results found. Echo Left ventricle: The cavity size was normal. There was mild concentric hypertrophy. Systolic function was normal. The estimated ejection fraction was in the range of 55% to 60%. Wall motion was normal; there were no regional wall motion abnormalities. Doppler parameters are consistent with abnormal left ventricular relaxation (grade 1 diastolic  dysfunction). - Mitral valve: Calcified annulus. There was trivial regurgitation. - Atrial septum: No defect or patent foramen ovale was identified.  Impressions:  - Compared to the prior echo in 01/2012, EF has normalized. There is diastolic dysfunction with indeterminate filling pressure.   Scheduled Meds: . cholecalciferol  5,000 Units Oral Daily  . colchicine  0.6 mg Oral q1800  . dofetilide  500 mcg Oral BID  . estradiol  1 mg Oral Daily  . furosemide  40 mg Oral Daily  . glipiZIDE  10 mg Oral Q breakfast  . insulin aspart  0-9 Units Subcutaneous TID WC  . insulin glargine  10 Units Subcutaneous QHS  . levothyroxine  137 mcg Oral QAC breakfast  . magnesium oxide  400 mg Oral QID  . metFORMIN  500 mg Oral BID WC  . metoprolol tartrate  25 mg  Oral BID  . pantoprazole  20 mg Oral Daily  . potassium chloride  10 mEq Oral QODAY  . rivaroxaban  20 mg Oral Q supper  . simvastatin  40 mg Oral QPM  . sodium chloride  3 mL Intravenous Q12H   Continuous Infusions:   Time spent: 45 minutes  [facility] Pager [phone]. If 7PM-7AM, please contact night-coverage at [email or web address], password [facility] 08/04/2013, 1:11 PM  LOS: 3 days

## 2013-08-05 ENCOUNTER — Encounter (HOSPITAL_COMMUNITY): Payer: Self-pay | Admitting: Cardiology

## 2013-08-05 LAB — BASIC METABOLIC PANEL
Anion gap: 18 — ABNORMAL HIGH (ref 5–15)
BUN: 23 mg/dL (ref 6–23)
CHLORIDE: 93 meq/L — AB (ref 96–112)
CO2: 28 meq/L (ref 19–32)
Calcium: 9.7 mg/dL (ref 8.4–10.5)
Creatinine, Ser: 0.96 mg/dL (ref 0.50–1.10)
GFR calc Af Amer: 68 mL/min — ABNORMAL LOW (ref >90)
GFR calc non Af Amer: 59 mL/min — ABNORMAL LOW (ref >90)
Glucose, Bld: 185 mg/dL — ABNORMAL HIGH (ref 70–99)
Potassium: 4.3 mEq/L (ref 3.7–5.3)
SODIUM: 139 meq/L (ref 137–147)

## 2013-08-05 LAB — MAGNESIUM: MAGNESIUM: 1.6 mg/dL (ref 1.5–2.5)

## 2013-08-05 LAB — GLUCOSE, CAPILLARY
GLUCOSE-CAPILLARY: 253 mg/dL — AB (ref 70–99)
Glucose-Capillary: 196 mg/dL — ABNORMAL HIGH (ref 70–99)

## 2013-08-05 MED ORDER — "PEN NEEDLES 1/2"" 29G X 12MM MISC": Status: DC

## 2013-08-05 MED ORDER — INSULIN GLARGINE 100 UNIT/ML SOLOSTAR PEN: 12.0000 [IU] | PEN_INJECTOR | Freq: Every day | SUBCUTANEOUS | Status: DC

## 2013-08-05 NOTE — Discharge Summary (Signed)
Physician Discharge Summary  [name] [id number] DOB: July 24, 1940 DOA: 08/01/2013  PCP: Octavio Graves, DO  Admit date: 08/01/2013 Discharge date: 08/05/2013  Time spent: greater than 30 minutes  Recommendations for Outpatient Follow-up:  1. Monitor BMET  Discharge Diagnoses:  Principal Problem:   Acute diastolic heart failure Active Problems:   HYPOTHYROIDISM   ASTHMA   Chest pain at rest, chronic intermittent   DM neuropathy, type II diabetes mellitus   PAF (paroxysmal atrial fibrillation)   HTN (hypertension)   Diabetes mellitus without complication   Hyperlipidemia   SOB (shortness of breath)   Bradycardia   Discharge Condition: stable  Filed Weights   08/03/13 0611 08/04/13 0648 08/05/13 0427  Weight: 95.2 kg (209 lb 14.1 oz) 94.756 kg (208 lb 14.4 oz) 94.031 kg (207 lb 4.8 oz)    History of present illness:  73 y.o. female with a history of Paroxysmal Atrial Fibrillation on Xarelto and Tikosyn Rx, along with history of DM2, HTN, and Hypothyroid who presents to the ED with complaints of worsening SOB over the past week and swelling of her ankles and feet. She reports having SOB for over a year, and reports having Orthopnea, and Chest Tightness, and sharp stabbing Left sided chest pain which began last night. She had associated dizziness, and Diaphoresis associated with her chest pain. She contacted her Cardiologist and was advised to go to the ED For evaluation.   CXR showed no CHF. proBNP 607. Admitted for treatment of acute Heart failure.   Hospital Course:  Admitted to telemetry. Given IV lasix. Cardiology consulted.  Metoprolol dose decreased from 125 mg bid to 25 mg bid due to bradycardia.  Echocardiogram showed improved EF from previous. Now normal. Grade 1 diastolic dysfunction.   Patient was diuresed down to a discharge weight of 207 lbs. MI ruled out by troponins.  By discharge, HR about 60, no further dyspnea, and edema improved.    During  hospitalization, daughter voiced concerns about diabetes poorly controlled in hospital and at home. Inquired about insulin.  CBGs ran in the high 100s. hgb a1c 6.8, however. Started on nightly lantus and pt would like to take this at home. Given insulin teaching.  Other medical problems remained stable   Procedures:  none  Consultations:  cardiology  Discharge Exam: Filed Vitals:   08/05/13 0427  BP: 114/51  Pulse: 57  Temp: 97.9 F (36.6 C)  Resp: 18    General: comfortable Cardiovascular: RRR Respiratory: CTA Ext minimal edema   Discharge Instructions   Activity as tolerated - No restrictions    Complete by:  As directed      Diet - low sodium heart healthy    Complete by:  As directed      Diet - low sodium heart healthy    Complete by:  As directed      Diet Carb Modified    Complete by:  As directed      Discharge instructions    Complete by:  As directed   Change metoprolol to 0.5 tablet twice daily     Increase activity slowly    Complete by:  As directed             Medication List         acetaminophen 500 MG tablet  Commonly known as:  TYLENOL  Take 1,000 mg by mouth daily as needed for moderate pain.     cholecalciferol 1000 UNITS tablet  Commonly known as:  VITAMIN D  Take 5,000 Units by mouth daily.     clonazePAM 0.5 MG tablet  Commonly known as:  KLONOPIN  Take 0.5 mg by mouth 3 (three) times daily as needed for anxiety.     colchicine 0.6 MG tablet  Take 0.6 mg by mouth daily at 6 PM.     0000000 EX  Apply 1 application topically 2 (two) times daily.     diphenhydrAMINE 25 MG tablet  Commonly known as:  BENADRYL  Take 25 mg by mouth every 6 (six) hours as needed.     dofetilide 500 MCG capsule  Commonly known as:  TIKOSYN  Take 1 capsule (500 mcg total) by mouth 2 (two) times daily.     estradiol 1 MG tablet  Commonly known as:  ESTRACE  Take 1 mg by mouth daily.     exenatide 5 MCG/0.02ML Sopn injection  Commonly  known as:  BYETTA  Inject 5 mcg into the skin 2 (two) times daily with a meal.     furosemide 40 MG tablet  Commonly known as:  LASIX  Take 1 tablet (40 mg total) by mouth daily.     GAS-X PO  Take 1 tablet by mouth as needed (for gas relief).     glipiZIDE 10 MG 24 hr tablet  Commonly known as:  GLUCOTROL XL  Take 10 mg by mouth daily with breakfast.     Insulin Glargine 100 UNIT/ML Solostar Pen  Commonly known as:  LANTUS SOLOSTAR  Inject 12 Units into the skin daily at 10 pm.     lansoprazole 30 MG capsule  Commonly known as:  PREVACID  Take 1 capsule (30 mg total) by mouth daily at 12 noon.     levothyroxine 137 MCG tablet  Commonly known as:  SYNTHROID, LEVOTHROID  Take 137 mcg by mouth daily before breakfast.     magnesium oxide 400 MG tablet  Commonly known as:  MAG-OX  Take 400 mg by mouth 4 (four) times daily.     metFORMIN 500 MG 24 hr tablet  Commonly known as:  GLUCOPHAGE-XR  Take 500 mg by mouth 2 (two) times daily.     metoprolol 50 MG tablet  Commonly known as:  LOPRESSOR  Take 0.5 tablets (25 mg total) by mouth 2 (two) times daily.     PEN NEEDLES 29GX1/2" 29G X 12MM Misc  Or needle that fits on solostar pen     potassium chloride 10 MEQ tablet  Commonly known as:  K-DUR  Take 1 tablet (10 mEq total) by mouth every other day. Or as directed     promethazine 25 MG tablet  Commonly known as:  PHENERGAN  Take 25 mg by mouth every 6 (six) hours as needed for nausea or vomiting.     rivaroxaban 20 MG Tabs tablet  Commonly known as:  XARELTO  Take 1 tablet (20 mg total) by mouth daily.     simvastatin 40 MG tablet  Commonly known as:  ZOCOR  Take 40 mg by mouth every evening.     traMADol 50 MG tablet  Commonly known as:  ULTRAM  Take 50-100 mg by mouth every 6 (six) hours as needed for moderate pain.       Allergies  Allergen Reactions  . Blueberry Flavor Anaphylaxis  . Januvia [Sitagliptin] Shortness Of Breath  . Lipitor [Atorvastatin]  Shortness Of Breath  . Penicillins Anaphylaxis  . Prednisone Anaphylaxis  . Vancomycin Anaphylaxis  . Cefprozil     REACTION:  unknown reaction  . Cetacaine [Butamben-Tetracaine-Benzocaine] Nausea And Vomiting and Swelling  . Dicyclomine Nausea And Vomiting and Other (See Comments)    "Heart trouble"; Headaches and increased blood sugars  . Diltiazem     Nausea and chest pain  . Food     Melons. Bananas.   . Hydrocodone Hives  . Imdur [Isosorbide]     Hives,headache,palpitations  . Losartan Potassium Other (See Comments)    Shortness of breath   . Nitroglycerin     Made patient go into cardiac arrest  . Oxycodone Hives  . Avelox [Moxifloxacin] Swelling and Rash       Follow-up Information   Follow up with BUTLER, CYNTHIA, DO In 1 week. (to check heart rate and blood pressure)    Contact information:   [place] [id number] [phone]        The results of significant diagnostics from this hospitalization (including imaging, microbiology, ancillary and laboratory) are listed below for reference.    Significant Diagnostic Studies: Dg Chest 2 View  08/01/2013   CLINICAL DATA:  Shortness of breath, chest heaviness.  EXAM: CHEST  2 VIEW  COMPARISON:  Chest radiograph May 15, 2013  FINDINGS: The cardiac silhouette appears moderately enlarged, similar. Mediastinal silhouette is nonsuspicious. Electronic device projects at left heart border. No pleural effusions or focal consolidations. Scarring left lung base. No pneumothorax. Soft tissue planes and included osseous structures are nonsuspicious.  IMPRESSION: Stable appearance of the chest: Cardiomegaly and left lung base scarring.   Electronically Signed   By: Elon Alas   On: 08/01/2013 20:08   Echo Left ventricle: The cavity size was normal. There was mild concentric hypertrophy. Systolic function was normal. The estimated ejection fraction was in the range of 55% to 60%. Wall motion was normal; there  were no regional wall motion abnormalities. Doppler parameters are consistent with abnormal left ventricular relaxation (grade 1 diastolic dysfunction). - Mitral valve: Calcified annulus. There was trivial regurgitation. - Atrial septum: No defect or patent foramen ovale was identified.  Impressions:  - Compared to the prior echo in 01/2012, EF has normalized. There is diastolic dysfunction with indeterminate filling pressure.   Microbiology: No results found for this or any previous visit (from the past 240 hour(s)).   Labs: Basic Metabolic Panel:  Recent Labs Lab 08/01/13 1940 08/02/13 1005 08/03/13 0259 08/03/13 1104 08/04/13 0500 08/05/13 0256  NA 141  --  135*  --  137 139  K 4.2  --  4.0  --  4.3 4.3  CL 99  --  91*  --  91* 93*  CO2 23  --  28  --  28 28  GLUCOSE 128*  --  180*  --  180* 185*  BUN 14  --  17  --  19 23  CREATININE 0.79  --  1.00  --  0.90 0.96  CALCIUM 9.7  --  9.6  --  9.9 9.7  MG  --  1.4*  --  1.6 1.6 1.6   Liver Function Tests: No results found for this basename: AST, ALT, ALKPHOS, BILITOT, PROT, ALBUMIN,  in the last 168 hours No results found for this basename: LIPASE, AMYLASE,  in the last 168 hours No results found for this basename: AMMONIA,  in the last 168 hours CBC:  Recent Labs Lab 08/01/13 1940  WBC 8.0  HGB 11.5*  HCT 37.8  MCV 88.7  PLT 223   Cardiac Enzymes:  Recent Labs Lab  08/02/13 1005 08/02/13 1301 08/02/13 1823  TROPONINI <0.30 <0.30 <0.30   BNP: BNP (last 3 results)  Recent Labs  05/15/13 1520 08/01/13 1940 08/03/13 1058  PROBNP 549.5* 607.1* 256.8*   CBG:  Recent Labs Lab 08/04/13 0644 08/04/13 1119 08/04/13 1629 08/04/13 2122 08/05/13 0607  GLUCAP 182* 196* 175* 162* 196*       Signed:  Mykenzi Vanzile L  [facility] 08/05/2013, 10:15 AM

## 2013-08-05 NOTE — Care Management Note (Signed)
    Page 1 of 1   08/05/2013     11:23:56 AM CARE MANAGEMENT NOTE 08/05/2013  Patient:  [name], [name]   Account Number:  [id number]  Date Initiated:  08/02/2013  Documentation initiated by:  Lorne Skeens  Subjective/Objective Assessment:   Patient was admitted with right-sided heart failure. Lives at home alone.     Action/Plan:   Will follow for discharge needs pending physician orders.   Anticipated DC Date:  08/05/2013   Anticipated DC Plan:  [place]  CM consult      [facility] Choice  HOME HEALTH   Choice offered to / List presented to:  C-1 Patient        Ava arranged  HH-10 DISEASE MANAGEMENT  HH-1 RN      [place].   Status of service:  Completed, signed off Medicare Important Message given?  YES (If response is "NO", the following Medicare IM given date fields will be blank) Date Medicare IM given:   Medicare IM given by:   Date Additional Medicare IM given:  08/05/2013 Additional Medicare IM given by:  [facility]  Discharge Disposition:  [place]  Per UR Regulation:  Reviewed for med. necessity/level of care/duration of stay  If discussed at [place] of Stay Meetings, dates discussed:    Comments:  08/05/13 10:39 CM met with pt in room to offer choice for HHPT/OT.  Pt refuses both PT/OT but requests HHRN to check on her vitals and medications( CHF disease management).  CM called discharging MD who placed an order for [facility].  CM verified address and contact infor with pt.  Referral texted to [facility].  No other CM needs were communicated.  Mariane Masters, BSN, CM [phone].

## 2013-08-05 NOTE — Progress Notes (Signed)
Subjective:  Complains of a burning type pain when she takes a deep breath. The family asked repeatedly while she is still having chest pain.  Objective:  Vital Signs in the last 24 hours: BP 114/51  Pulse 57  Temp(Src) 97.9 F (36.6 C) (Oral)  Resp 18  Ht 5\' 2"  (1.575 m)  Wt 94.031 kg (207 lb 4.8 oz)  BMI 37.91 kg/m2  SpO2 99%  Physical Exam:  Lungs:  Clear  Cardiac:  Regular rhythm, normal S1 and S2, no S3 Abdomen:  Soft, nontender, no masses Extremities:  No edema present  Intake/Output from previous day: 07/10 0701 - 07/11 0700 In: 720 [P.O.:720] Out: 2250 [Urine:2250] Weight Filed Weights   08/03/13 0611 08/04/13 0648 08/05/13 0427  Weight: 95.2 kg (209 lb 14.1 oz) 94.756 kg (208 lb 14.4 oz) 94.031 kg (207 lb 4.8 oz)    Lab Results: Basic Metabolic Panel:  Recent Labs  08/04/13 0500 08/05/13 0256  NA 137 139  K 4.3 4.3  CL 91* 93*  CO2 28 28  GLUCOSE 180* 185*  BUN 19 23  CREATININE 0.90 0.96   BNP    Component Value Date/Time   PROBNP 256.8* 08/03/2013 1058   PROTIME: Lab Results  Component Value Date   INR 2.67* 08/01/2013   INR 1.60* 01/08/2013   INR 1.6* 08/11/2012    Telemetry: Sinus rhythm  Assessment/Plan:  1. Acute on chronic diastolic heart failure improved 2. Paroxysmal atrial fibrillation 3. Coronary artery disease moderate recent catheterization 4. Long-term use of anticoagulants 5. Atypical chest pain  Recommendations:  Should go on a higher diuretic dose. Followup in cardiology in 2 weeks.     Kerry Hough  MD [facility]  08/05/2013, 11:08 AM

## 2013-08-21 ENCOUNTER — Ambulatory Visit (INDEPENDENT_AMBULATORY_CARE_PROVIDER_SITE_OTHER): Payer: Medicare HMO | Admitting: *Deleted

## 2013-08-21 DIAGNOSIS — R55 Syncope and collapse: Secondary | ICD-10-CM

## 2013-08-23 NOTE — Progress Notes (Signed)
Loop recorder 

## 2013-09-12 ENCOUNTER — Encounter: Payer: Self-pay | Admitting: Cardiology

## 2013-09-12 ENCOUNTER — Telehealth: Payer: Self-pay | Admitting: Cardiology

## 2013-09-12 NOTE — Telephone Encounter (Signed)
Attempted to call pt x1, no answer.

## 2013-09-15 LAB — MDC_IDC_ENUM_SESS_TYPE_REMOTE

## 2013-09-20 ENCOUNTER — Encounter: Payer: Self-pay | Admitting: Internal Medicine

## 2013-09-20 ENCOUNTER — Ambulatory Visit (INDEPENDENT_AMBULATORY_CARE_PROVIDER_SITE_OTHER): Payer: Medicare HMO | Admitting: *Deleted

## 2013-09-20 ENCOUNTER — Ambulatory Visit (INDEPENDENT_AMBULATORY_CARE_PROVIDER_SITE_OTHER): Payer: Medicare HMO | Admitting: Internal Medicine

## 2013-09-20 VITALS — BP 124/66 | HR 69 | Ht 62.0 in | Wt 209.0 lb

## 2013-09-20 DIAGNOSIS — I480 Paroxysmal atrial fibrillation: Secondary | ICD-10-CM

## 2013-09-20 DIAGNOSIS — R55 Syncope and collapse: Secondary | ICD-10-CM

## 2013-09-20 DIAGNOSIS — I4891 Unspecified atrial fibrillation: Secondary | ICD-10-CM

## 2013-09-20 LAB — BASIC METABOLIC PANEL
BUN: 21 mg/dL (ref 6–23)
CALCIUM: 9.7 mg/dL (ref 8.4–10.5)
CO2: 32 meq/L (ref 19–32)
CREATININE: 1 mg/dL (ref 0.4–1.2)
Chloride: 95 mEq/L — ABNORMAL LOW (ref 96–112)
GFR: 61.63 mL/min (ref >60.00)
GLUCOSE: 121 mg/dL — AB (ref 70–99)
Potassium: 3.8 mEq/L (ref 3.5–5.1)
Sodium: 138 mEq/L (ref 135–145)

## 2013-09-20 LAB — MAGNESIUM: Magnesium: 1.3 mg/dL — ABNORMAL LOW (ref 1.5–2.5)

## 2013-09-20 LAB — MDC_IDC_ENUM_SESS_TYPE_INCLINIC

## 2013-09-20 NOTE — Patient Instructions (Signed)
Your physician recommends that you schedule a follow-up appointment in: 3 months with Ceasar Lund
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
## 2013-09-22 ENCOUNTER — Encounter: Payer: Self-pay | Admitting: Internal Medicine

## 2013-09-22 ENCOUNTER — Encounter: Payer: Self-pay | Admitting: Cardiology

## 2013-09-22 ENCOUNTER — Ambulatory Visit (INDEPENDENT_AMBULATORY_CARE_PROVIDER_SITE_OTHER): Payer: Medicare HMO | Admitting: Cardiology

## 2013-09-22 VITALS — BP 124/80 | HR 80 | Ht 62.0 in | Wt 213.3 lb

## 2013-09-22 DIAGNOSIS — I5032 Chronic diastolic (congestive) heart failure: Secondary | ICD-10-CM

## 2013-09-22 DIAGNOSIS — I480 Paroxysmal atrial fibrillation: Secondary | ICD-10-CM

## 2013-09-22 DIAGNOSIS — E1149 Type 2 diabetes mellitus with other diabetic neurological complication: Secondary | ICD-10-CM

## 2013-09-22 DIAGNOSIS — E1140 Type 2 diabetes mellitus with diabetic neuropathy, unspecified: Secondary | ICD-10-CM

## 2013-09-22 DIAGNOSIS — I4891 Unspecified atrial fibrillation: Secondary | ICD-10-CM

## 2013-09-22 DIAGNOSIS — I509 Heart failure, unspecified: Secondary | ICD-10-CM

## 2013-09-22 DIAGNOSIS — I2510 Atherosclerotic heart disease of native coronary artery without angina pectoris: Secondary | ICD-10-CM

## 2013-09-22 DIAGNOSIS — I10 Essential (primary) hypertension: Secondary | ICD-10-CM

## 2013-09-22 DIAGNOSIS — E1142 Type 2 diabetes mellitus with diabetic polyneuropathy: Secondary | ICD-10-CM

## 2013-09-22 HISTORY — DX: Chronic diastolic (congestive) heart failure: I50.32

## 2013-09-22 NOTE — Patient Instructions (Signed)
Continue your current therapy  If you have more swelling or weight gain you may take an extra 20 mg of lasix in the afternoon.   I will see you in 3 months.
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
## 2013-09-28 NOTE — Progress Notes (Signed)
Loop recorder 

## 2013-09-29 ENCOUNTER — Other Ambulatory Visit: Payer: Self-pay | Admitting: Cardiology

## 2013-09-29 ENCOUNTER — Encounter: Payer: Self-pay | Admitting: Internal Medicine

## 2013-09-29 ENCOUNTER — Telehealth: Payer: Self-pay | Admitting: Cardiology

## 2013-09-29 MED ORDER — FUROSEMIDE 40 MG PO TABS: 40.0000 mg | ORAL_TABLET | Freq: Every day | ORAL | Status: DC

## 2013-09-29 NOTE — Telephone Encounter (Signed)
Mr. Keagy is calling because she is has only 3 pills left of Furosemide 40mg  and they are saying she needs authorization from the provider to get more and she wont be able to get anymore without authorization until  Thursday.. Please Call   Thanks

## 2013-09-29 NOTE — Telephone Encounter (Signed)
Rx refill sent to patient pharmacy, called patient and informed her Rx was sent in. Patient voiced understanding

## 2013-10-01 ENCOUNTER — Emergency Department (HOSPITAL_COMMUNITY)
Admission: EM | Admit: 2013-10-01 | Discharge: 2013-10-01 | Disposition: A | Discharge status: Home / Self Care | Payer: Medicare HMO | Attending: Emergency Medicine | Admitting: Emergency Medicine

## 2013-10-01 ENCOUNTER — Emergency Department (HOSPITAL_COMMUNITY): Payer: Medicare HMO

## 2013-10-01 ENCOUNTER — Encounter (HOSPITAL_COMMUNITY): Payer: Self-pay | Admitting: Emergency Medicine

## 2013-10-01 DIAGNOSIS — Z7901 Long term (current) use of anticoagulants: Secondary | ICD-10-CM | POA: Diagnosis not present

## 2013-10-01 DIAGNOSIS — Z860100 Personal history of colon polyps, unspecified: Secondary | ICD-10-CM | POA: Insufficient documentation

## 2013-10-01 DIAGNOSIS — E119 Type 2 diabetes mellitus without complications: Secondary | ICD-10-CM | POA: Diagnosis not present

## 2013-10-01 DIAGNOSIS — Z79899 Other long term (current) drug therapy: Secondary | ICD-10-CM | POA: Diagnosis not present

## 2013-10-01 DIAGNOSIS — Z880 Allergy status to penicillin: Secondary | ICD-10-CM | POA: Insufficient documentation

## 2013-10-01 DIAGNOSIS — J45909 Unspecified asthma, uncomplicated: Secondary | ICD-10-CM | POA: Insufficient documentation

## 2013-10-01 DIAGNOSIS — E785 Hyperlipidemia, unspecified: Secondary | ICD-10-CM | POA: Diagnosis not present

## 2013-10-01 DIAGNOSIS — Z9889 Other specified postprocedural states: Secondary | ICD-10-CM | POA: Diagnosis not present

## 2013-10-01 DIAGNOSIS — Z8669 Personal history of other diseases of the nervous system and sense organs: Secondary | ICD-10-CM | POA: Diagnosis not present

## 2013-10-01 DIAGNOSIS — F329 Major depressive disorder, single episode, unspecified: Secondary | ICD-10-CM | POA: Insufficient documentation

## 2013-10-01 DIAGNOSIS — F3289 Other specified depressive episodes: Secondary | ICD-10-CM | POA: Insufficient documentation

## 2013-10-01 DIAGNOSIS — M79609 Pain in unspecified limb: Secondary | ICD-10-CM | POA: Insufficient documentation

## 2013-10-01 DIAGNOSIS — I119 Hypertensive heart disease without heart failure: Secondary | ICD-10-CM | POA: Insufficient documentation

## 2013-10-01 DIAGNOSIS — M7989 Other specified soft tissue disorders: Secondary | ICD-10-CM | POA: Diagnosis present

## 2013-10-01 DIAGNOSIS — I2510 Atherosclerotic heart disease of native coronary artery without angina pectoris: Secondary | ICD-10-CM | POA: Diagnosis not present

## 2013-10-01 DIAGNOSIS — Z794 Long term (current) use of insulin: Secondary | ICD-10-CM | POA: Insufficient documentation

## 2013-10-01 DIAGNOSIS — R21 Rash and other nonspecific skin eruption: Secondary | ICD-10-CM | POA: Diagnosis not present

## 2013-10-01 DIAGNOSIS — G8929 Other chronic pain: Secondary | ICD-10-CM | POA: Diagnosis not present

## 2013-10-01 DIAGNOSIS — E039 Hypothyroidism, unspecified: Secondary | ICD-10-CM | POA: Diagnosis not present

## 2013-10-01 DIAGNOSIS — K219 Gastro-esophageal reflux disease without esophagitis: Secondary | ICD-10-CM | POA: Insufficient documentation

## 2013-10-01 DIAGNOSIS — E669 Obesity, unspecified: Secondary | ICD-10-CM | POA: Diagnosis not present

## 2013-10-01 DIAGNOSIS — M79672 Pain in left foot: Secondary | ICD-10-CM

## 2013-10-01 DIAGNOSIS — F411 Generalized anxiety disorder: Secondary | ICD-10-CM | POA: Diagnosis not present

## 2013-10-01 DIAGNOSIS — I4891 Unspecified atrial fibrillation: Secondary | ICD-10-CM | POA: Diagnosis not present

## 2013-10-01 DIAGNOSIS — I498 Other specified cardiac arrhythmias: Secondary | ICD-10-CM | POA: Diagnosis not present

## 2013-10-01 LAB — BASIC METABOLIC PANEL
Anion gap: 20 — ABNORMAL HIGH (ref 5–15)
BUN: 17 mg/dL (ref 6–23)
CHLORIDE: 94 meq/L — AB (ref 96–112)
CO2: 26 mEq/L (ref 19–32)
Calcium: 9.5 mg/dL (ref 8.4–10.5)
Creatinine, Ser: 0.85 mg/dL (ref 0.50–1.10)
GFR calc non Af Amer: 67 mL/min — ABNORMAL LOW (ref >90)
GFR, EST AFRICAN AMERICAN: 78 mL/min — AB (ref >90)
GLUCOSE: 189 mg/dL — AB (ref 70–99)
Potassium: 4 mEq/L (ref 3.7–5.3)
SODIUM: 140 meq/L (ref 137–147)

## 2013-10-01 LAB — CBC
HEMATOCRIT: 38.5 % (ref 36.0–46.0)
Hemoglobin: 11.9 g/dL — ABNORMAL LOW (ref 12.0–15.0)
MCH: 26.4 pg (ref 26.0–34.0)
MCHC: 30.9 g/dL (ref 30.0–36.0)
MCV: 85.4 fL (ref 78.0–100.0)
Platelets: 269 10*3/uL (ref 150–400)
RBC: 4.51 MIL/uL (ref 3.87–5.11)
RDW: 15.7 % — ABNORMAL HIGH (ref 11.5–15.5)
WBC: 8.1 10*3/uL (ref 4.0–10.5)

## 2013-10-01 LAB — I-STAT TROPONIN, ED: Troponin i, poc: 0.02 ng/mL (ref 0.00–0.08)

## 2013-10-01 LAB — PRO B NATRIURETIC PEPTIDE: Pro B Natriuretic peptide (BNP): 169.5 pg/mL — ABNORMAL HIGH (ref 0–125)

## 2013-10-01 MED ORDER — TRAMADOL HCL 50 MG PO TABS
50.0000 mg | ORAL_TABLET | Freq: Once | ORAL | Status: AC
  Administered 2013-10-01: 50 mg via ORAL
  Filled 2013-10-01: qty 1

## 2013-10-01 NOTE — Discharge Instructions (Signed)
Workup any evidence of significant congestive heart failure at this point in time. The x-rays of your left foot do show bony spurs pain could be related to that or perhaps early flare of gout. Recommend followup with your podiatry team. As we discussed you feel more comfortable discontinuing her tramadol for the pain would recommend using that. Return for any newer worse symptoms.

## 2013-10-01 NOTE — ED Notes (Addendum)
Pt reports increasing left heel pain and swelling x 3 days.  This is her main complaint for coming to the ED today.  Reports having gout and bone spurs in the right foot.  Pt states it feels like bone spurs in her left heel but "worse."
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
## 2013-10-01 NOTE — ED Notes (Signed)
Pt reports having cardiac hx, having swelling to bilateral legs and sob with exertion. Airway intact at triage and no acute distress noted. spo2 97% at triage and speaking in full sentences.

## 2013-10-06 LAB — MDC_IDC_ENUM_SESS_TYPE_REMOTE

## 2013-10-10 ENCOUNTER — Encounter: Payer: Self-pay | Admitting: Internal Medicine

## 2013-10-10 ENCOUNTER — Other Ambulatory Visit: Payer: Self-pay | Admitting: Cardiology

## 2013-10-10 LAB — MDC_IDC_ENUM_SESS_TYPE_REMOTE

## 2013-10-11 ENCOUNTER — Encounter: Payer: Self-pay | Admitting: Internal Medicine

## 2013-10-12 ENCOUNTER — Encounter: Payer: Self-pay | Admitting: Internal Medicine

## 2013-10-18 ENCOUNTER — Encounter: Payer: Self-pay | Admitting: Internal Medicine

## 2013-10-20 ENCOUNTER — Ambulatory Visit (INDEPENDENT_AMBULATORY_CARE_PROVIDER_SITE_OTHER): Payer: Medicare HMO | Admitting: *Deleted

## 2013-10-20 DIAGNOSIS — R55 Syncope and collapse: Secondary | ICD-10-CM

## 2013-10-20 LAB — MDC_IDC_ENUM_SESS_TYPE_REMOTE

## 2013-11-03 NOTE — Progress Notes (Signed)
Loop recorder 

## 2013-11-09 ENCOUNTER — Other Ambulatory Visit: Payer: Self-pay | Admitting: Cardiology

## 2013-11-09 ENCOUNTER — Encounter: Payer: Self-pay | Admitting: Internal Medicine

## 2013-11-15 ENCOUNTER — Encounter: Payer: Self-pay | Admitting: Internal Medicine

## 2013-11-16 ENCOUNTER — Ambulatory Visit (INDEPENDENT_AMBULATORY_CARE_PROVIDER_SITE_OTHER): Payer: Medicare HMO | Admitting: *Deleted

## 2013-11-16 DIAGNOSIS — R55 Syncope and collapse: Secondary | ICD-10-CM

## 2013-11-21 LAB — MDC_IDC_ENUM_SESS_TYPE_REMOTE

## 2013-11-24 NOTE — Progress Notes (Signed)
Loop recorder 

## 2013-11-30 ENCOUNTER — Encounter: Payer: Self-pay | Admitting: Internal Medicine

## 2013-11-30 ENCOUNTER — Other Ambulatory Visit: Payer: Self-pay | Admitting: Physician Assistant

## 2013-12-15 ENCOUNTER — Ambulatory Visit (INDEPENDENT_AMBULATORY_CARE_PROVIDER_SITE_OTHER): Payer: Medicare HMO | Admitting: Cardiology

## 2013-12-15 ENCOUNTER — Ambulatory Visit (HOSPITAL_COMMUNITY)
Admission: RE | Admit: 2013-12-15 | Discharge: 2013-12-15 | Disposition: A | Discharge status: Home / Self Care | Payer: Medicare HMO | Source: Ambulatory Visit | Attending: Nurse Practitioner | Admitting: Nurse Practitioner

## 2013-12-15 ENCOUNTER — Encounter (HOSPITAL_COMMUNITY): Payer: Self-pay | Admitting: Nurse Practitioner

## 2013-12-15 ENCOUNTER — Encounter: Payer: Self-pay | Admitting: Cardiology

## 2013-12-15 VITALS — BP 137/79 | HR 119 | Ht 62.0 in | Wt 216.7 lb

## 2013-12-15 VITALS — BP 122/78 | HR 114 | Ht 62.0 in | Wt 216.3 lb

## 2013-12-15 DIAGNOSIS — E119 Type 2 diabetes mellitus without complications: Secondary | ICD-10-CM | POA: Insufficient documentation

## 2013-12-15 DIAGNOSIS — I119 Hypertensive heart disease without heart failure: Secondary | ICD-10-CM | POA: Insufficient documentation

## 2013-12-15 DIAGNOSIS — F419 Anxiety disorder, unspecified: Secondary | ICD-10-CM | POA: Insufficient documentation

## 2013-12-15 DIAGNOSIS — K3184 Gastroparesis: Secondary | ICD-10-CM | POA: Diagnosis not present

## 2013-12-15 DIAGNOSIS — Z794 Long term (current) use of insulin: Secondary | ICD-10-CM | POA: Diagnosis not present

## 2013-12-15 DIAGNOSIS — I4819 Other persistent atrial fibrillation: Secondary | ICD-10-CM

## 2013-12-15 DIAGNOSIS — I2510 Atherosclerotic heart disease of native coronary artery without angina pectoris: Secondary | ICD-10-CM

## 2013-12-15 DIAGNOSIS — I471 Supraventricular tachycardia: Secondary | ICD-10-CM | POA: Insufficient documentation

## 2013-12-15 DIAGNOSIS — J45909 Unspecified asthma, uncomplicated: Secondary | ICD-10-CM | POA: Insufficient documentation

## 2013-12-15 DIAGNOSIS — M109 Gout, unspecified: Secondary | ICD-10-CM | POA: Diagnosis not present

## 2013-12-15 DIAGNOSIS — K219 Gastro-esophageal reflux disease without esophagitis: Secondary | ICD-10-CM | POA: Diagnosis not present

## 2013-12-15 DIAGNOSIS — M5030 Other cervical disc degeneration, unspecified cervical region: Secondary | ICD-10-CM | POA: Insufficient documentation

## 2013-12-15 DIAGNOSIS — I480 Paroxysmal atrial fibrillation: Secondary | ICD-10-CM | POA: Diagnosis present

## 2013-12-15 DIAGNOSIS — F329 Major depressive disorder, single episode, unspecified: Secondary | ICD-10-CM | POA: Diagnosis not present

## 2013-12-15 DIAGNOSIS — K449 Diaphragmatic hernia without obstruction or gangrene: Secondary | ICD-10-CM | POA: Insufficient documentation

## 2013-12-15 DIAGNOSIS — E785 Hyperlipidemia, unspecified: Secondary | ICD-10-CM | POA: Diagnosis not present

## 2013-12-15 DIAGNOSIS — E039 Hypothyroidism, unspecified: Secondary | ICD-10-CM | POA: Diagnosis not present

## 2013-12-15 DIAGNOSIS — G510 Bell's palsy: Secondary | ICD-10-CM | POA: Diagnosis not present

## 2013-12-15 DIAGNOSIS — E559 Vitamin D deficiency, unspecified: Secondary | ICD-10-CM | POA: Diagnosis not present

## 2013-12-15 DIAGNOSIS — I429 Cardiomyopathy, unspecified: Secondary | ICD-10-CM | POA: Diagnosis not present

## 2013-12-15 DIAGNOSIS — Z7901 Long term (current) use of anticoagulants: Secondary | ICD-10-CM | POA: Insufficient documentation

## 2013-12-15 DIAGNOSIS — I10 Essential (primary) hypertension: Secondary | ICD-10-CM

## 2013-12-15 DIAGNOSIS — E669 Obesity, unspecified: Secondary | ICD-10-CM | POA: Insufficient documentation

## 2013-12-15 DIAGNOSIS — I481 Persistent atrial fibrillation: Secondary | ICD-10-CM

## 2013-12-15 DIAGNOSIS — Z79899 Other long term (current) drug therapy: Secondary | ICD-10-CM | POA: Insufficient documentation

## 2013-12-15 DIAGNOSIS — I5032 Chronic diastolic (congestive) heart failure: Secondary | ICD-10-CM

## 2013-12-15 NOTE — Patient Instructions (Signed)
Continue your current therapy  Take an extra lasix in the afternoon if you have increased swelling or weight gain  I will see you in 4 months
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
## 2013-12-15 NOTE — Addendum Note (Signed)
Encounter addended by: Roderic Palau, NP on: 12/15/2013  2:49 PM<BR>     Documentation filed: Clinical Notes

## 2013-12-15 NOTE — Patient Instructions (Signed)
Your physician recommends that you schedule a follow-up appointment in: 3 months with Dr. Rayann Heman at the [facility]
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
## 2013-12-18 ENCOUNTER — Ambulatory Visit (HOSPITAL_COMMUNITY): Payer: Medicare HMO | Admitting: Nurse Practitioner

## 2013-12-18 NOTE — Addendum Note (Signed)
Encounter addended by: Roderic Palau, NP on: 12/18/2013 10:06 AM<BR>     Documentation filed: Notes Section

## 2013-12-19 ENCOUNTER — Ambulatory Visit (INDEPENDENT_AMBULATORY_CARE_PROVIDER_SITE_OTHER): Payer: Medicare HMO | Admitting: *Deleted

## 2013-12-19 DIAGNOSIS — R55 Syncope and collapse: Secondary | ICD-10-CM

## 2013-12-20 LAB — MDC_IDC_ENUM_SESS_TYPE_REMOTE

## 2013-12-27 NOTE — Progress Notes (Signed)
Loop recorder 

## 2013-12-28 ENCOUNTER — Encounter: Payer: Self-pay | Admitting: Internal Medicine

## 2014-01-04 ENCOUNTER — Encounter (HOSPITAL_COMMUNITY): Payer: Self-pay | Admitting: Cardiology

## 2014-01-05 ENCOUNTER — Telehealth: Payer: Self-pay | Admitting: Cardiology

## 2014-01-05 ENCOUNTER — Encounter: Payer: Self-pay | Admitting: Internal Medicine

## 2014-01-05 NOTE — Telephone Encounter (Signed)
[name] is calling about her mother having a lot of fluid on her legs and wants to speak to someone about .Marland Kitchen Please Call .   Thanks

## 2014-01-05 NOTE — Telephone Encounter (Signed)
Recipient's voice mailbox full, unable to leave message.

## 2014-01-08 ENCOUNTER — Encounter: Payer: Self-pay | Admitting: Internal Medicine

## 2014-01-17 ENCOUNTER — Ambulatory Visit (INDEPENDENT_AMBULATORY_CARE_PROVIDER_SITE_OTHER): Payer: Medicare HMO | Admitting: *Deleted

## 2014-01-17 DIAGNOSIS — R55 Syncope and collapse: Secondary | ICD-10-CM

## 2014-01-24 NOTE — Progress Notes (Signed)
Loop recorder 

## 2014-01-29 ENCOUNTER — Encounter: Payer: Self-pay | Admitting: Internal Medicine

## 2014-01-29 ENCOUNTER — Other Ambulatory Visit: Payer: Self-pay | Admitting: Physician Assistant

## 2014-02-06 LAB — MDC_IDC_ENUM_SESS_TYPE_REMOTE

## 2014-02-08 ENCOUNTER — Encounter (HOSPITAL_COMMUNITY): Payer: Self-pay | Admitting: Internal Medicine

## 2014-02-19 ENCOUNTER — Ambulatory Visit: Payer: Medicare HMO | Admitting: *Deleted

## 2014-02-19 DIAGNOSIS — R55 Syncope and collapse: Secondary | ICD-10-CM

## 2014-02-20 ENCOUNTER — Telehealth: Payer: Self-pay | Admitting: Cardiology

## 2014-02-20 NOTE — Telephone Encounter (Signed)
Pt's daughter called. Her mother became upset over something and had "stinging" in her chest. She activated loop recorder. Daughter called wanting to know what it showed. I explained we would be contacted by monitor company for any arrhythmias.  Kerin Ransom PA-C 02/20/2014 5:23 PM

## 2014-02-22 NOTE — Progress Notes (Signed)
Loop recorder 

## 2014-02-23 ENCOUNTER — Encounter: Payer: Self-pay | Admitting: Internal Medicine

## 2014-02-26 NOTE — Telephone Encounter (Signed)
Does not appear that this note was routed to anyone.  Claiborne Billings, Please patient to follow-up on symptoms.  Remote transmission at the time was unrevealing. If she continues to have concerns, she could follow-up with [facility].

## 2014-03-08 ENCOUNTER — Telehealth: Payer: Self-pay | Admitting: Cardiology

## 2014-03-08 NOTE — Telephone Encounter (Signed)
Pt. States that her lt. Foot has  Been retaining more fluid and thinks she needs more medication,pt. Was told by Dr. Rayann Heman yesterday to elevate her legs above her heart and that has helped but just wants to know if there is something else that can be done

## 2014-03-08 NOTE — Telephone Encounter (Signed)
Pt's daughter called in stating that for the past 2wks the pt has had swelling in her leg and gaining fluid. She has been taking an extra fluid pill but it does not seem to be helping much. She would like to be advised on what to do. Please follow up with pt  Thanks

## 2014-03-08 NOTE — Telephone Encounter (Signed)
May increase lasix to 40 mg bid until swelling better. Keep feet elevated. May want to wear support hose. Avoid salt.  Peter [name] MD, [facility]

## 2014-03-14 ENCOUNTER — Encounter: Payer: Self-pay | Admitting: Internal Medicine

## 2014-03-16 ENCOUNTER — Telehealth: Payer: Self-pay

## 2014-03-16 NOTE — Telephone Encounter (Signed)
Received a call from patient's daughter [name].She stated mother needs appointment next week.Stated for the past 3 weeks sob worse.Increase swelling in lower legs even after taking a extra fluid pill every day.Appointment scheduled with Truitt Merle NP Monday 03/19/14 at 1:30 pm at [facility] office.

## 2014-03-19 ENCOUNTER — Inpatient Hospital Stay (HOSPITAL_COMMUNITY)
Admission: AD | Admit: 2014-03-19 | Discharge: 2014-03-22 | DRG: 286 | Disposition: A | Discharge status: Home / Self Care | Payer: Medicare HMO | Source: Ambulatory Visit | Attending: Cardiology | Admitting: Cardiology

## 2014-03-19 ENCOUNTER — Encounter: Payer: Self-pay | Admitting: Nurse Practitioner

## 2014-03-19 ENCOUNTER — Other Ambulatory Visit: Payer: Self-pay

## 2014-03-19 ENCOUNTER — Encounter (HOSPITAL_COMMUNITY): Payer: Self-pay | Admitting: General Practice

## 2014-03-19 ENCOUNTER — Ambulatory Visit (INDEPENDENT_AMBULATORY_CARE_PROVIDER_SITE_OTHER): Payer: Medicare HMO | Admitting: Nurse Practitioner

## 2014-03-19 ENCOUNTER — Other Ambulatory Visit: Payer: Self-pay | Admitting: Nurse Practitioner

## 2014-03-19 ENCOUNTER — Observation Stay (HOSPITAL_COMMUNITY): Payer: Medicare HMO

## 2014-03-19 VITALS — BP 126/58 | HR 67 | Ht 62.0 in | Wt 225.6 lb

## 2014-03-19 DIAGNOSIS — R0600 Dyspnea, unspecified: Secondary | ICD-10-CM

## 2014-03-19 DIAGNOSIS — I5032 Chronic diastolic (congestive) heart failure: Secondary | ICD-10-CM | POA: Diagnosis present

## 2014-03-19 DIAGNOSIS — R55 Syncope and collapse: Secondary | ICD-10-CM | POA: Diagnosis present

## 2014-03-19 DIAGNOSIS — E1140 Type 2 diabetes mellitus with diabetic neuropathy, unspecified: Secondary | ICD-10-CM | POA: Diagnosis present

## 2014-03-19 DIAGNOSIS — I480 Paroxysmal atrial fibrillation: Secondary | ICD-10-CM

## 2014-03-19 DIAGNOSIS — E1169 Type 2 diabetes mellitus with other specified complication: Secondary | ICD-10-CM | POA: Diagnosis present

## 2014-03-19 DIAGNOSIS — F329 Major depressive disorder, single episode, unspecified: Secondary | ICD-10-CM | POA: Diagnosis present

## 2014-03-19 DIAGNOSIS — Z7901 Long term (current) use of anticoagulants: Secondary | ICD-10-CM

## 2014-03-19 DIAGNOSIS — I129 Hypertensive chronic kidney disease with stage 1 through stage 4 chronic kidney disease, or unspecified chronic kidney disease: Secondary | ICD-10-CM | POA: Diagnosis present

## 2014-03-19 DIAGNOSIS — I10 Essential (primary) hypertension: Secondary | ICD-10-CM

## 2014-03-19 DIAGNOSIS — Z79899 Other long term (current) drug therapy: Secondary | ICD-10-CM

## 2014-03-19 DIAGNOSIS — I4581 Long QT syndrome: Secondary | ICD-10-CM | POA: Diagnosis present

## 2014-03-19 DIAGNOSIS — I428 Other cardiomyopathies: Secondary | ICD-10-CM | POA: Diagnosis present

## 2014-03-19 DIAGNOSIS — I25110 Atherosclerotic heart disease of native coronary artery with unstable angina pectoris: Principal | ICD-10-CM | POA: Diagnosis present

## 2014-03-19 DIAGNOSIS — E1122 Type 2 diabetes mellitus with diabetic chronic kidney disease: Secondary | ICD-10-CM | POA: Diagnosis present

## 2014-03-19 DIAGNOSIS — E669 Obesity, unspecified: Secondary | ICD-10-CM | POA: Diagnosis present

## 2014-03-19 DIAGNOSIS — Z6839 Body mass index (BMI) 39.0-39.9, adult: Secondary | ICD-10-CM

## 2014-03-19 DIAGNOSIS — J45909 Unspecified asthma, uncomplicated: Secondary | ICD-10-CM | POA: Diagnosis present

## 2014-03-19 DIAGNOSIS — Z794 Long term (current) use of insulin: Secondary | ICD-10-CM

## 2014-03-19 DIAGNOSIS — K219 Gastro-esophageal reflux disease without esophagitis: Secondary | ICD-10-CM | POA: Diagnosis present

## 2014-03-19 DIAGNOSIS — Z79890 Hormone replacement therapy (postmenopausal): Secondary | ICD-10-CM

## 2014-03-19 DIAGNOSIS — R002 Palpitations: Secondary | ICD-10-CM

## 2014-03-19 DIAGNOSIS — R079 Chest pain, unspecified: Secondary | ICD-10-CM

## 2014-03-19 DIAGNOSIS — E039 Hypothyroidism, unspecified: Secondary | ICD-10-CM | POA: Diagnosis present

## 2014-03-19 DIAGNOSIS — F419 Anxiety disorder, unspecified: Secondary | ICD-10-CM | POA: Diagnosis present

## 2014-03-19 DIAGNOSIS — E66813 Obesity, class 3: Secondary | ICD-10-CM | POA: Diagnosis present

## 2014-03-19 DIAGNOSIS — I5033 Acute on chronic diastolic (congestive) heart failure: Secondary | ICD-10-CM

## 2014-03-19 DIAGNOSIS — Z90710 Acquired absence of both cervix and uterus: Secondary | ICD-10-CM

## 2014-03-19 DIAGNOSIS — Z95818 Presence of other cardiac implants and grafts: Secondary | ICD-10-CM

## 2014-03-19 DIAGNOSIS — I110 Hypertensive heart disease with heart failure: Secondary | ICD-10-CM | POA: Diagnosis present

## 2014-03-19 DIAGNOSIS — E785 Hyperlipidemia, unspecified: Secondary | ICD-10-CM | POA: Diagnosis present

## 2014-03-19 HISTORY — DX: Adverse effect of unspecified anesthetic, initial encounter: T41.45XA

## 2014-03-19 HISTORY — DX: Other complications of anesthesia, initial encounter: T88.59XA

## 2014-03-19 HISTORY — DX: Other specified postprocedural states: Z98.890

## 2014-03-19 HISTORY — DX: Other specified postprocedural states: R11.2

## 2014-03-19 LAB — CBC WITH DIFFERENTIAL/PLATELET
Basophils Absolute: 0 10*3/uL (ref 0.0–0.1)
Basophils Relative: 0 % (ref 0–1)
Eosinophils Absolute: 0.4 10*3/uL (ref 0.0–0.7)
Eosinophils Relative: 4 % (ref 0–5)
HCT: 35.2 % — ABNORMAL LOW (ref 36.0–46.0)
Hemoglobin: 10.4 g/dL — ABNORMAL LOW (ref 12.0–15.0)
Lymphocytes Relative: 33 % (ref 12–46)
Lymphs Abs: 3 10*3/uL (ref 0.7–4.0)
MCH: 23.7 pg — ABNORMAL LOW (ref 26.0–34.0)
MCHC: 29.5 g/dL — ABNORMAL LOW (ref 30.0–36.0)
MCV: 80.4 fL (ref 78.0–100.0)
Monocytes Absolute: 0.8 10*3/uL (ref 0.1–1.0)
Monocytes Relative: 8 % (ref 3–12)
Neutro Abs: 5 10*3/uL (ref 1.7–7.7)
Neutrophils Relative %: 55 % (ref 43–77)
Platelets: 256 10*3/uL (ref 150–400)
RBC: 4.38 MIL/uL (ref 3.87–5.11)
RDW: 17.2 % — ABNORMAL HIGH (ref 11.5–15.5)
WBC: 9.2 10*3/uL (ref 4.0–10.5)

## 2014-03-19 LAB — COMPREHENSIVE METABOLIC PANEL
ALT: 16 U/L (ref 0–35)
AST: 42 U/L — ABNORMAL HIGH (ref 0–37)
Albumin: 3.8 g/dL (ref 3.5–5.2)
Alkaline Phosphatase: 58 U/L (ref 39–117)
Anion gap: 11 (ref 5–15)
BUN: 16 mg/dL (ref 6–23)
CO2: 30 mmol/L (ref 19–32)
Calcium: 9.5 mg/dL (ref 8.4–10.5)
Chloride: 96 mmol/L (ref 96–112)
Creatinine, Ser: 1.51 mg/dL — ABNORMAL HIGH (ref 0.50–1.10)
GFR calc Af Amer: 39 mL/min — ABNORMAL LOW (ref >90)
GFR calc non Af Amer: 34 mL/min — ABNORMAL LOW (ref >90)
Glucose, Bld: 135 mg/dL — ABNORMAL HIGH (ref 70–99)
Potassium: 4.2 mmol/L (ref 3.5–5.1)
Sodium: 137 mmol/L (ref 135–145)
Total Bilirubin: 0.5 mg/dL (ref 0.3–1.2)
Total Protein: 7.2 g/dL (ref 6.0–8.3)

## 2014-03-19 LAB — TROPONIN I
Troponin I: <0.03 ng/mL (ref <0.031)
Troponin I: <0.03 ng/mL (ref <0.031)

## 2014-03-19 LAB — MAGNESIUM: Magnesium: 1.4 mg/dL — ABNORMAL LOW (ref 1.5–2.5)

## 2014-03-19 LAB — PROTIME-INR
INR: 2.13 — ABNORMAL HIGH (ref 0.00–1.49)
Prothrombin Time: 24 seconds — ABNORMAL HIGH (ref 11.6–15.2)

## 2014-03-19 LAB — GLUCOSE, CAPILLARY: GLUCOSE-CAPILLARY: 132 mg/dL — AB (ref 70–99)

## 2014-03-19 LAB — TSH: TSH: 6.625 u[IU]/mL — ABNORMAL HIGH (ref 0.350–4.500)

## 2014-03-19 LAB — BRAIN NATRIURETIC PEPTIDE
B Natriuretic Peptide: 85.7 pg/mL (ref 0.0–100.0)
B Natriuretic Peptide: 87.3 pg/mL (ref 0.0–100.0)

## 2014-03-19 LAB — APTT: aPTT: 41 seconds — ABNORMAL HIGH (ref 24–37)

## 2014-03-19 MED ORDER — ASPIRIN 81 MG PO CHEW
324.0000 mg | CHEWABLE_TABLET | ORAL | Status: AC
  Administered 2014-03-19: 324 mg via ORAL
  Filled 2014-03-19: qty 4

## 2014-03-19 MED ORDER — SODIUM CHLORIDE 0.9 % IV SOLN: 250.0000 mL | INTRAVENOUS | Status: DC | PRN

## 2014-03-19 MED ORDER — FUROSEMIDE 10 MG/ML IJ SOLN
60.0000 mg | Freq: Two times a day (BID) | INTRAMUSCULAR | Status: DC
  Administered 2014-03-19 – 2014-03-20 (×3): 60 mg via INTRAVENOUS
  Filled 2014-03-19 (×4): qty 6

## 2014-03-19 MED ORDER — INSULIN GLARGINE 100 UNIT/ML ~~LOC~~ SOLN
35.0000 [IU] | Freq: Two times a day (BID) | SUBCUTANEOUS | Status: DC
  Administered 2014-03-19 – 2014-03-22 (×4): 35 [IU] via SUBCUTANEOUS
  Filled 2014-03-19 (×7): qty 0.35

## 2014-03-19 MED ORDER — HEPARIN SODIUM (PORCINE) 5000 UNIT/ML IJ SOLN
5000.0000 [IU] | Freq: Three times a day (TID) | INTRAMUSCULAR | Status: DC
  Administered 2014-03-19 – 2014-03-22 (×6): 5000 [IU] via SUBCUTANEOUS
  Filled 2014-03-19 (×9): qty 1

## 2014-03-19 MED ORDER — DOFETILIDE 500 MCG PO CAPS
500.0000 ug | ORAL_CAPSULE | Freq: Two times a day (BID) | ORAL | Status: DC
  Administered 2014-03-19 – 2014-03-20 (×3): 500 ug via ORAL
  Filled 2014-03-19 (×6): qty 1

## 2014-03-19 MED ORDER — SODIUM CHLORIDE 0.9 % IJ SOLN: 3.0000 mL | INTRAMUSCULAR | Status: DC | PRN

## 2014-03-19 MED ORDER — DIPHENHYDRAMINE HCL 25 MG PO CAPS
25.0000 mg | ORAL_CAPSULE | Freq: Four times a day (QID) | ORAL | Status: DC | PRN
  Filled 2014-03-19: qty 1

## 2014-03-19 MED ORDER — ESTRADIOL 1 MG PO TABS
1.0000 mg | ORAL_TABLET | Freq: Every day | ORAL | Status: DC
  Administered 2014-03-20 – 2014-03-22 (×2): 1 mg via ORAL
  Filled 2014-03-19 (×4): qty 1

## 2014-03-19 MED ORDER — ACETAMINOPHEN 325 MG PO TABS: 650.0000 mg | ORAL_TABLET | ORAL | Status: DC | PRN

## 2014-03-19 MED ORDER — PANTOPRAZOLE SODIUM 40 MG PO TBEC
40.0000 mg | DELAYED_RELEASE_TABLET | Freq: Every day | ORAL | Status: DC
  Administered 2014-03-20 – 2014-03-22 (×2): 40 mg via ORAL
  Filled 2014-03-19 (×2): qty 1

## 2014-03-19 MED ORDER — ONDANSETRON HCL 4 MG/2ML IJ SOLN: 4.0000 mg | Freq: Four times a day (QID) | INTRAMUSCULAR | Status: DC | PRN

## 2014-03-19 MED ORDER — TRAMADOL HCL 50 MG PO TABS
50.0000 mg | ORAL_TABLET | Freq: Four times a day (QID) | ORAL | Status: DC | PRN
  Administered 2014-03-21 – 2014-03-22 (×2): 50 mg via ORAL
  Filled 2014-03-19 (×2): qty 1

## 2014-03-19 MED ORDER — DIPHENHYDRAMINE HCL 25 MG PO CAPS: 25.0000 mg | ORAL_CAPSULE | Freq: Four times a day (QID) | ORAL | Status: DC | PRN

## 2014-03-19 MED ORDER — LEVOTHYROXINE SODIUM 137 MCG PO TABS
137.0000 ug | ORAL_TABLET | Freq: Every day | ORAL | Status: DC
  Administered 2014-03-20 – 2014-03-22 (×3): 137 ug via ORAL
  Filled 2014-03-19 (×4): qty 1

## 2014-03-19 MED ORDER — SODIUM CHLORIDE 0.9 % IJ SOLN
3.0000 mL | Freq: Two times a day (BID) | INTRAMUSCULAR | Status: DC
  Administered 2014-03-19 – 2014-03-21 (×2): 3 mL via INTRAVENOUS

## 2014-03-19 MED ORDER — METOPROLOL TARTRATE 25 MG PO TABS
25.0000 mg | ORAL_TABLET | Freq: Two times a day (BID) | ORAL | Status: DC
  Administered 2014-03-19 – 2014-03-22 (×5): 25 mg via ORAL
  Filled 2014-03-19 (×9): qty 1

## 2014-03-19 MED ORDER — POTASSIUM CHLORIDE CRYS ER 20 MEQ PO TBCR
20.0000 meq | EXTENDED_RELEASE_TABLET | Freq: Every day | ORAL | Status: DC
  Administered 2014-03-20 – 2014-03-22 (×2): 20 meq via ORAL
  Filled 2014-03-19 (×3): qty 1

## 2014-03-19 MED ORDER — MAGNESIUM OXIDE 400 (241.3 MG) MG PO TABS
400.0000 mg | ORAL_TABLET | Freq: Two times a day (BID) | ORAL | Status: DC
  Administered 2014-03-19 – 2014-03-20 (×2): 400 mg via ORAL
  Filled 2014-03-19 (×3): qty 1

## 2014-03-19 MED ORDER — SIMVASTATIN 40 MG PO TABS
40.0000 mg | ORAL_TABLET | Freq: Every day | ORAL | Status: DC
  Administered 2014-03-19 – 2014-03-21 (×3): 40 mg via ORAL
  Filled 2014-03-19 (×5): qty 1

## 2014-03-19 MED ORDER — ASPIRIN EC 81 MG PO TBEC
81.0000 mg | DELAYED_RELEASE_TABLET | Freq: Every day | ORAL | Status: DC
  Administered 2014-03-20 – 2014-03-22 (×2): 81 mg via ORAL
  Filled 2014-03-19 (×3): qty 1

## 2014-03-19 MED ORDER — CLONAZEPAM 0.5 MG PO TABS
0.5000 mg | ORAL_TABLET | Freq: Three times a day (TID) | ORAL | Status: DC | PRN
  Administered 2014-03-20 – 2014-03-22 (×3): 0.5 mg via ORAL
  Filled 2014-03-19 (×3): qty 1

## 2014-03-19 MED ORDER — METFORMIN HCL 500 MG PO TABS
500.0000 mg | ORAL_TABLET | Freq: Two times a day (BID) | ORAL | Status: DC
  Administered 2014-03-19: 500 mg via ORAL
  Filled 2014-03-19 (×5): qty 1

## 2014-03-19 MED ORDER — ACETAMINOPHEN 325 MG PO TABS
650.0000 mg | ORAL_TABLET | ORAL | Status: DC | PRN
  Administered 2014-03-20: 650 mg via ORAL
  Filled 2014-03-19: qty 2

## 2014-03-19 MED ORDER — SODIUM CHLORIDE 0.9 % IJ SOLN
3.0000 mL | Freq: Two times a day (BID) | INTRAMUSCULAR | Status: DC
  Administered 2014-03-19 – 2014-03-22 (×4): 3 mL via INTRAVENOUS

## 2014-03-19 MED ORDER — ASPIRIN 300 MG RE SUPP
300.0000 mg | RECTAL | Status: AC
  Filled 2014-03-19: qty 1

## 2014-03-19 MED ORDER — GLIPIZIDE ER 10 MG PO TB24
10.0000 mg | ORAL_TABLET | Freq: Every day | ORAL | Status: DC
  Administered 2014-03-22: 10 mg via ORAL
  Filled 2014-03-19 (×4): qty 1
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
## 2014-03-19 NOTE — Progress Notes (Signed)
Pt daughter states that pt is allergic to the heart monitor leads and that she will break out in red, oozing patches where the sticky leads are placed.  There are no hypoallergenic leads anywhere at [facility] currently.  Charge Nurse made aware and did speak to [employer], PA about the issue.  Dr. [name] was also notified and a verbal order for Benadryl 25mg  PO q6hr prn was entered.  Upon further discussion with the pt and daughter, they said that pt will not break out right away and will take a day or so.  The plan is to replace leads once per shift and wash skin with mild soap and water.  Family and pt agree.  Will continue to monitor.

## 2014-03-19 NOTE — Progress Notes (Signed)
Patient was a direct to the unit from the doctor's office. She reported having an allergy to the electrodes supplied on the unit. Called materials management to see if any hypoallergenic electrodes were available and there was none. Called various units including the ED and Peds but no were available. Paged NP to notify. Primary RN was also able to contact attending MD. New orders written for benadryl prn. Patient was told will be changed regularly and if she experiences any SOB, itching, rashes, or skin irritation to let nursing staff know as soon as possible.
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
## 2014-03-20 ENCOUNTER — Encounter: Payer: Self-pay | Admitting: Internal Medicine

## 2014-03-20 ENCOUNTER — Ambulatory Visit (INDEPENDENT_AMBULATORY_CARE_PROVIDER_SITE_OTHER): Payer: Medicare HMO | Admitting: *Deleted

## 2014-03-20 DIAGNOSIS — I4581 Long QT syndrome: Secondary | ICD-10-CM | POA: Diagnosis present

## 2014-03-20 DIAGNOSIS — I25750 Atherosclerosis of native coronary artery of transplanted heart with unstable angina: Secondary | ICD-10-CM

## 2014-03-20 DIAGNOSIS — I480 Paroxysmal atrial fibrillation: Secondary | ICD-10-CM

## 2014-03-20 DIAGNOSIS — K219 Gastro-esophageal reflux disease without esophagitis: Secondary | ICD-10-CM | POA: Diagnosis present

## 2014-03-20 DIAGNOSIS — I5031 Acute diastolic (congestive) heart failure: Secondary | ICD-10-CM

## 2014-03-20 DIAGNOSIS — Z794 Long term (current) use of insulin: Secondary | ICD-10-CM | POA: Diagnosis not present

## 2014-03-20 DIAGNOSIS — R55 Syncope and collapse: Secondary | ICD-10-CM

## 2014-03-20 DIAGNOSIS — F329 Major depressive disorder, single episode, unspecified: Secondary | ICD-10-CM | POA: Diagnosis present

## 2014-03-20 DIAGNOSIS — E1140 Type 2 diabetes mellitus with diabetic neuropathy, unspecified: Secondary | ICD-10-CM | POA: Diagnosis present

## 2014-03-20 DIAGNOSIS — F419 Anxiety disorder, unspecified: Secondary | ICD-10-CM | POA: Diagnosis present

## 2014-03-20 DIAGNOSIS — I10 Essential (primary) hypertension: Secondary | ICD-10-CM

## 2014-03-20 DIAGNOSIS — J45909 Unspecified asthma, uncomplicated: Secondary | ICD-10-CM | POA: Diagnosis present

## 2014-03-20 DIAGNOSIS — Z79899 Other long term (current) drug therapy: Secondary | ICD-10-CM | POA: Diagnosis not present

## 2014-03-20 DIAGNOSIS — Z6839 Body mass index (BMI) 39.0-39.9, adult: Secondary | ICD-10-CM | POA: Diagnosis not present

## 2014-03-20 DIAGNOSIS — E039 Hypothyroidism, unspecified: Secondary | ICD-10-CM | POA: Diagnosis present

## 2014-03-20 DIAGNOSIS — I110 Hypertensive heart disease with heart failure: Secondary | ICD-10-CM | POA: Diagnosis present

## 2014-03-20 DIAGNOSIS — Z95818 Presence of other cardiac implants and grafts: Secondary | ICD-10-CM | POA: Diagnosis not present

## 2014-03-20 DIAGNOSIS — R0602 Shortness of breath: Secondary | ICD-10-CM | POA: Diagnosis present

## 2014-03-20 DIAGNOSIS — I5033 Acute on chronic diastolic (congestive) heart failure: Secondary | ICD-10-CM | POA: Diagnosis present

## 2014-03-20 DIAGNOSIS — E669 Obesity, unspecified: Secondary | ICD-10-CM | POA: Diagnosis present

## 2014-03-20 DIAGNOSIS — Z79890 Hormone replacement therapy (postmenopausal): Secondary | ICD-10-CM | POA: Diagnosis not present

## 2014-03-20 DIAGNOSIS — Z90710 Acquired absence of both cervix and uterus: Secondary | ICD-10-CM | POA: Diagnosis not present

## 2014-03-20 DIAGNOSIS — I428 Other cardiomyopathies: Secondary | ICD-10-CM | POA: Diagnosis present

## 2014-03-20 DIAGNOSIS — I25110 Atherosclerotic heart disease of native coronary artery with unstable angina pectoris: Secondary | ICD-10-CM | POA: Diagnosis present

## 2014-03-20 DIAGNOSIS — Z7901 Long term (current) use of anticoagulants: Secondary | ICD-10-CM | POA: Diagnosis not present

## 2014-03-20 DIAGNOSIS — E785 Hyperlipidemia, unspecified: Secondary | ICD-10-CM | POA: Diagnosis present

## 2014-03-20 DIAGNOSIS — I200 Unstable angina: Secondary | ICD-10-CM

## 2014-03-20 LAB — BASIC METABOLIC PANEL
Anion gap: 13 (ref 5–15)
BUN: 18 mg/dL (ref 6–23)
CO2: 32 mmol/L (ref 19–32)
Calcium: 9.6 mg/dL (ref 8.4–10.5)
Chloride: 94 mmol/L — ABNORMAL LOW (ref 96–112)
Creatinine, Ser: 1.21 mg/dL — ABNORMAL HIGH (ref 0.50–1.10)
GFR calc Af Amer: 51 mL/min — ABNORMAL LOW (ref >90)
GFR calc non Af Amer: 44 mL/min — ABNORMAL LOW (ref >90)
Glucose, Bld: 111 mg/dL — ABNORMAL HIGH (ref 70–99)
Potassium: 4 mmol/L (ref 3.5–5.1)
Sodium: 139 mmol/L (ref 135–145)

## 2014-03-20 LAB — TROPONIN I: Troponin I: <0.03 ng/mL (ref <0.031)

## 2014-03-20 LAB — HEMOGLOBIN A1C
Hgb A1c MFr Bld: 8 % — ABNORMAL HIGH (ref 4.8–5.6)
Mean Plasma Glucose: 183 mg/dL

## 2014-03-20 LAB — GLUCOSE, CAPILLARY
GLUCOSE-CAPILLARY: 133 mg/dL — AB (ref 70–99)
Glucose-Capillary: 195 mg/dL — ABNORMAL HIGH (ref 70–99)
Glucose-Capillary: 220 mg/dL — ABNORMAL HIGH (ref 70–99)

## 2014-03-20 LAB — MDC_IDC_ENUM_SESS_TYPE_REMOTE

## 2014-03-20 LAB — MAGNESIUM: Magnesium: 1.9 mg/dL (ref 1.5–2.5)

## 2014-03-20 MED ORDER — SODIUM CHLORIDE 0.9 % IJ SOLN: 3.0000 mL | INTRAMUSCULAR | Status: DC | PRN

## 2014-03-20 MED ORDER — MAGNESIUM SULFATE 2 GM/50ML IV SOLN
2.0000 g | Freq: Once | INTRAVENOUS | Status: AC
  Administered 2014-03-20: 2 g via INTRAVENOUS
  Filled 2014-03-20: qty 50

## 2014-03-20 MED ORDER — SODIUM CHLORIDE 0.9 % IV SOLN
INTRAVENOUS | Status: AC
  Administered 2014-03-21: 05:00:00 via INTRAVENOUS

## 2014-03-20 MED ORDER — SODIUM CHLORIDE 0.9 % IV SOLN: 250.0000 mL | INTRAVENOUS | Status: DC | PRN

## 2014-03-20 MED ORDER — MAGNESIUM OXIDE 400 (241.3 MG) MG PO TABS
400.0000 mg | ORAL_TABLET | Freq: Four times a day (QID) | ORAL | Status: DC
Start: 2014-03-20 — End: 2014-03-22
  Administered 2014-03-20 – 2014-03-22 (×8): 400 mg via ORAL
  Filled 2014-03-20 (×11): qty 1

## 2014-03-20 MED ORDER — SODIUM CHLORIDE 0.9 % IJ SOLN: 3.0000 mL | Freq: Two times a day (BID) | INTRAMUSCULAR | Status: DC

## 2014-03-20 MED ORDER — ASPIRIN 81 MG PO CHEW
81.0000 mg | CHEWABLE_TABLET | ORAL | Status: AC
  Administered 2014-03-21: 81 mg via ORAL
  Filled 2014-03-20: qty 1

## 2014-03-20 NOTE — Care Management Note (Signed)
    Page 1 of 1   03/22/2014     4:04:32 PM CARE MANAGEMENT NOTE 03/22/2014  Patient:  [name], [name]   Account Number:  [id number]  Date Initiated:  03/20/2014  Documentation initiated by:  Khai Torbert  Subjective/Objective Assessment:   Pt adm on 03/19/14 with CP, CHF, elev trop.  PTA, pt resides at home with son.     Action/Plan:   Will follow for dc needs as pt progresses.   Anticipated DC Date:  03/22/2014   Anticipated DC Plan:  [place]  CM consult      Choice offered to / List presented to:     DME arranged  [facility]      DME agency  [facility]        Status of service:  Completed, signed off Medicare Important Message given?  YES (If response is "NO", the following Medicare IM given date fields will be blank) Date Medicare IM given:  03/22/2014 Medicare IM given by:  Raeanne Deschler Date Additional Medicare IM given:   Additional Medicare IM given by:    Discharge Disposition:  HOME/SELF CARE  Per UR Regulation:  Reviewed for med. necessity/level of care/duration of stay  If discussed at [place] of Stay Meetings, dates discussed:    Comments:  03/22/14 Ellan Lambert, RN, BSN [phone] Pt for dc home today.  She will need home oxygen, as cont to desat with ambulation.  Referral for new home O2 faxed to [place] at [phone].  Portable tank to be delivered to pt's room prior to dc.

## 2014-03-20 NOTE — Progress Notes (Signed)
Loop recorder 

## 2014-03-20 NOTE — Progress Notes (Addendum)
     SUBJECTIVE: Breathing is better but still dyspneic when lying flat. Still having chest pressure that comes and goes.   BP 111/45 mmHg  Pulse 64  Temp(Src) 98.1 F (36.7 C) (Oral)  Resp 18  Ht 5\' 2"  (1.575 m)  Wt 218 lb 4.8 oz (99.02 kg)  BMI 39.92 kg/m2  SpO2 96%  Intake/Output Summary (Last 24 hours) at 03/20/14 1140 Last data filed at 03/20/14 1000  Gross per 24 hour  Intake    490 ml  Output   3551 ml  Net  -3061 ml    PHYSICAL EXAM General: Well developed, well nourished, in no acute distress. Alert and oriented x 3.  Psych:  Good affect, responds appropriately Neck: No JVD. No masses noted.  Lungs: Clear bilaterally with no wheezes or rhonci noted.  Heart: Regular with no murmurs noted. Abdomen: Bowel sounds are present. Soft, non-tender.  Extremities: Trace bilateral lower extremity edema.   LABS: Basic Metabolic Panel:  Recent Labs  03/19/14 1537 03/20/14 0250  NA 137 139  K 4.2 4.0  CL 96 94*  CO2 30 32  GLUCOSE 135* 111*  BUN 16 18  CREATININE 1.51* 1.21*  CALCIUM 9.5 9.6  MG 1.4*  --    CBC:  Recent Labs  03/19/14 1537  WBC 9.2  NEUTROABS 5.0  HGB 10.4*  HCT 35.2*  MCV 80.4  PLT 256   Cardiac Enzymes:  Recent Labs  03/19/14 1537 03/19/14 2024 03/20/14 0250  TROPONINI <0.03 <0.03 <0.03   Current Meds: . aspirin EC  81 mg Oral Daily  . dofetilide  500 mcg Oral BID  . estradiol  1 mg Oral Daily  . furosemide  60 mg Intravenous BID  . glipiZIDE  10 mg Oral Q breakfast  . heparin  5,000 Units Subcutaneous 3 times per day  . insulin glargine  35 Units Subcutaneous BID  . levothyroxine  137 mcg Oral QAC breakfast  . magnesium oxide  400 mg Oral BID  . metFORMIN  500 mg Oral BID WC  . metoprolol tartrate  25 mg Oral BID  . pantoprazole  40 mg Oral Daily  . potassium chloride  20 mEq Oral Daily  . simvastatin  40 mg Oral q1800  . sodium chloride  3 mL Intravenous Q12H  . sodium chloride  3 mL Intravenous Q12H    ASSESSMENT  AND PLAN:  1. CAD/Unstable angina: Troponin negative. Moderate CAD by cath December 2014. Now with chest pain c/w unstable angina. Xarelto on hold for possible cardiac cath. Will plan cardiac cath tomorrow. Right and left heart cath. Right heart cath to exclude pulm HTN per request of Dr. [name]. NPO at midnight.   2. Acute diastolic CHF: Diuresing well with IV Lasix. Follow I/O. Continue Lasix today.   3. Atrial fibrillation, paroxysmal: Xarelto on hold for cath. Sinus this am. Continue Tikosyn. Follow QTc interval which is 517 today. Will replace magnesium today and repeat EKG in am.   MCALHANY,CHRISTOPHER  2/23/201611:40 AM

## 2014-03-20 NOTE — Progress Notes (Signed)
Paged and spoke with [name] concerning patient. Daughter called earlier and wanted to know if patient was going for a cath today. [name] was unable to give an answer at the moment. He had not seen the patient yet today. Will let us know if cath will be today.

## 2014-03-21 ENCOUNTER — Encounter (HOSPITAL_COMMUNITY)
Admission: AD | Disposition: A | Discharge status: Home / Self Care | Payer: Self-pay | Source: Ambulatory Visit | Attending: Cardiology

## 2014-03-21 ENCOUNTER — Encounter: Payer: Medicare HMO | Admitting: Internal Medicine

## 2014-03-21 ENCOUNTER — Encounter (HOSPITAL_COMMUNITY): Payer: Self-pay | Admitting: Cardiovascular Disease

## 2014-03-21 DIAGNOSIS — I2510 Atherosclerotic heart disease of native coronary artery without angina pectoris: Secondary | ICD-10-CM

## 2014-03-21 DIAGNOSIS — I5033 Acute on chronic diastolic (congestive) heart failure: Secondary | ICD-10-CM

## 2014-03-21 HISTORY — PX: CARDIAC CATHETERIZATION: SHX172

## 2014-03-21 LAB — GLUCOSE, CAPILLARY
GLUCOSE-CAPILLARY: 172 mg/dL — AB (ref 70–99)
GLUCOSE-CAPILLARY: 148 mg/dL — AB (ref 70–99)
Glucose-Capillary: 215 mg/dL — ABNORMAL HIGH (ref 70–99)
Glucose-Capillary: 201 mg/dL — ABNORMAL HIGH (ref 70–99)
Glucose-Capillary: 89 mg/dL (ref 70–99)
Glucose-Capillary: 158 mg/dL — ABNORMAL HIGH (ref 70–99)

## 2014-03-21 LAB — BASIC METABOLIC PANEL
Anion gap: 13 (ref 5–15)
BUN: 20 mg/dL (ref 6–23)
CO2: 31 mmol/L (ref 19–32)
Calcium: 9.5 mg/dL (ref 8.4–10.5)
Chloride: 92 mmol/L — ABNORMAL LOW (ref 96–112)
Creatinine, Ser: 1.26 mg/dL — ABNORMAL HIGH (ref 0.50–1.10)
GFR calc Af Amer: 48 mL/min — ABNORMAL LOW (ref >90)
GFR calc non Af Amer: 42 mL/min — ABNORMAL LOW (ref >90)
Glucose, Bld: 167 mg/dL — ABNORMAL HIGH (ref 70–99)
Potassium: 3.5 mmol/L (ref 3.5–5.1)
Sodium: 136 mmol/L (ref 135–145)

## 2014-03-21 LAB — POCT I-STAT 3, ART BLOOD GAS (G3+)
Acid-Base Excess: 5 mmol/L — ABNORMAL HIGH (ref 0.0–2.0)
BICARBONATE: 31 meq/L — AB (ref 20.0–24.0)
O2 SAT: 96 %
PCO2 ART: 49.4 mmHg — AB (ref 35.0–45.0)
TCO2: 32 mmol/L (ref 0–100)
pH, Arterial: 7.406 (ref 7.350–7.450)
pO2, Arterial: 85 mmHg (ref 80.0–100.0)

## 2014-03-21 LAB — CBC
HEMATOCRIT: 35.3 % — AB (ref 36.0–46.0)
Hemoglobin: 10.5 g/dL — ABNORMAL LOW (ref 12.0–15.0)
MCH: 23.3 pg — ABNORMAL LOW (ref 26.0–34.0)
MCHC: 29.7 g/dL — ABNORMAL LOW (ref 30.0–36.0)
MCV: 78.4 fL (ref 78.0–100.0)
Platelets: 277 10*3/uL (ref 150–400)
RBC: 4.5 MIL/uL (ref 3.87–5.11)
RDW: 17.3 % — ABNORMAL HIGH (ref 11.5–15.5)
WBC: 8.1 10*3/uL (ref 4.0–10.5)

## 2014-03-21 LAB — MAGNESIUM: MAGNESIUM: 2 mg/dL (ref 1.5–2.5)

## 2014-03-21 LAB — POCT I-STAT 3, VENOUS BLOOD GAS (G3P V)
ACID-BASE EXCESS: 6 mmol/L — AB (ref 0.0–2.0)
Bicarbonate: 32.4 mEq/L — ABNORMAL HIGH (ref 20.0–24.0)
O2 Saturation: 70 %
TCO2: 34 mmol/L (ref 0–100)
pCO2, Ven: 53.8 mmHg — ABNORMAL HIGH (ref 45.0–50.0)
pH, Ven: 7.387 — ABNORMAL HIGH (ref 7.250–7.300)
pO2, Ven: 38 mmHg (ref 30.0–45.0)

## 2014-03-21 LAB — PROTIME-INR
INR: 1.14 (ref 0.00–1.49)
PROTHROMBIN TIME: 14.7 s (ref 11.6–15.2)

## 2014-03-21 SURGERY — RIGHT/LEFT HEART CATH AND CORONARY ANGIOGRAPHY

## 2014-03-21 MED ORDER — FENTANYL CITRATE 0.05 MG/ML IJ SOLN
INTRAMUSCULAR | Status: AC
  Filled 2014-03-21: qty 2

## 2014-03-21 MED ORDER — NITROGLYCERIN 1 MG/10 ML FOR IR/CATH LAB
INTRA_ARTERIAL | Status: AC
  Filled 2014-03-21: qty 10

## 2014-03-21 MED ORDER — MIDAZOLAM HCL 2 MG/2ML IJ SOLN
INTRAMUSCULAR | Status: AC
  Filled 2014-03-21: qty 2

## 2014-03-21 MED ORDER — POTASSIUM CHLORIDE CRYS ER 20 MEQ PO TBCR
60.0000 meq | EXTENDED_RELEASE_TABLET | Freq: Once | ORAL | Status: AC
  Administered 2014-03-21: 60 meq via ORAL
  Filled 2014-03-21: qty 3

## 2014-03-21 MED ORDER — LIDOCAINE HCL (PF) 1 % IJ SOLN
INTRAMUSCULAR | Status: AC
Start: 2014-03-21 — End: 2014-03-21
  Filled 2014-03-21: qty 30

## 2014-03-21 MED ORDER — DOFETILIDE 250 MCG PO CAPS
375.0000 ug | ORAL_CAPSULE | Freq: Two times a day (BID) | ORAL | Status: DC
  Administered 2014-03-21 – 2014-03-22 (×3): 375 ug via ORAL
  Filled 2014-03-21 (×5): qty 1

## 2014-03-21 MED ORDER — DOFETILIDE 500 MCG PO CAPS
500.0000 ug | ORAL_CAPSULE | Freq: Two times a day (BID) | ORAL | Status: DC
  Filled 2014-03-21 (×2): qty 1

## 2014-03-21 MED ORDER — HEPARIN SODIUM (PORCINE) 1000 UNIT/ML IJ SOLN
INTRAMUSCULAR | Status: AC
Start: 2014-03-21 — End: 2014-03-21
  Filled 2014-03-21: qty 1

## 2014-03-21 MED ORDER — VERAPAMIL HCL 2.5 MG/ML IV SOLN
INTRAVENOUS | Status: AC
  Filled 2014-03-21: qty 2

## 2014-03-21 MED ORDER — SODIUM CHLORIDE 0.9 % IV SOLN
INTRAVENOUS | Status: AC
Start: 2014-03-21 — End: 2014-03-21
  Administered 2014-03-21: 75 mL/h via INTRAVENOUS

## 2014-03-21 NOTE — Progress Notes (Signed)
SATURATION QUALIFICATIONS: (This note is used to comply with regulatory documentation for home oxygen)  Patient Saturations on Room Air at Rest = 95%  Patient Saturations on Room Air while Ambulating = 88%  Patient Saturations on 2 Liters of oxygen while Ambulatin 100%  Please briefly explain why patient needs home oxygen:

## 2014-03-21 NOTE — Progress Notes (Signed)
Site area: right groin a 7 french venous sheath was removed  Site Prior to Removal:  Level 0  Pressure Applied For 15 MINUTES    Minutes Beginning at 1000am Manual:   Yes.    Patient Status During Pull:  stable  Post Pull Groin Site:  Level 0  Post Pull Instructions Given:  Yes.    Post Pull Pulses Present:  Yes.    Dressing Applied:  Yes.    Comments:  Pt denies any discomfort at site.  VS remain stable during sheath pull

## 2014-03-21 NOTE — H&P (View-Only) (Signed)
     SUBJECTIVE: No chest pain this am.   BP 119/56 mmHg  Pulse 66  Temp(Src) 98.1 F (36.7 C) (Oral)  Resp 18  Ht 5\' 2"  (1.575 m)  Wt 214 lb 1.6 oz (97.115 kg)  BMI 39.15 kg/m2  SpO2 95%  Intake/Output Summary (Last 24 hours) at 03/21/14 0703 Last data filed at 03/21/14 [id number]  Gross per 24 hour  Intake    660 ml  Output   2500 ml  Net  -1840 ml    PHYSICAL EXAM General: Well developed, well nourished, in no acute distress. Alert and oriented x 3.  Psych:  Good affect, responds appropriately Neck: No JVD. No masses noted.  Lungs: Clear bilaterally with no wheezes or rhonci noted.  Heart: RRR with no murmurs noted. Abdomen: Bowel sounds are present. Soft, non-tender.  Extremities: No lower extremity edema.   LABS: Basic Metabolic Panel:  Recent Labs  03/20/14 0250 03/20/14 1540 03/21/14 0500  NA 139  --  136  K 4.0  --  3.5  CL 94*  --  92*  CO2 32  --  31  GLUCOSE 111*  --  167*  BUN 18  --  20  CREATININE 1.21*  --  1.26*  CALCIUM 9.6  --  9.5  MG  --  1.9 2.0   CBC:  Recent Labs  03/19/14 1537 03/21/14 0500  WBC 9.2 8.1  NEUTROABS 5.0  --   HGB 10.4* 10.5*  HCT 35.2* 35.3*  MCV 80.4 78.4  PLT 256 277   Cardiac Enzymes:  Recent Labs  03/19/14 1537 03/19/14 2024 03/20/14 0250  TROPONINI <0.03 <0.03 <0.03   Current Meds: . aspirin EC  81 mg Oral Daily  . dofetilide  500 mcg Oral BID  . estradiol  1 mg Oral Daily  . furosemide  60 mg Intravenous BID  . glipiZIDE  10 mg Oral Q breakfast  . heparin  5,000 Units Subcutaneous 3 times per day  . insulin glargine  35 Units Subcutaneous BID  . levothyroxine  137 mcg Oral QAC breakfast  . magnesium oxide  400 mg Oral QID  . metoprolol tartrate  25 mg Oral BID  . pantoprazole  40 mg Oral Daily  . potassium chloride  20 mEq Oral Daily  . simvastatin  40 mg Oral q1800  . sodium chloride  3 mL Intravenous Q12H  . sodium chloride  3 mL Intravenous Q12H  . sodium chloride  3 mL Intravenous Q12H      ASSESSMENT AND PLAN:  1. CAD/Unstable angina: Troponin negative. Moderate CAD by cath December 2014. Now with chest pain c/w unstable angina. Xarelto on hold for cardiac cath. Will plan cardiac cath today. Right and left heart cath. Right heart cath to exclude pulm HTN per request of Dr. [name].  2. Acute diastolic CHF: Diuresing well with IV Lasix. Net negative 4.9 liters since admission. Will stop IV Lasix and will base dosing on RHC pressures  3. Atrial fibrillation, paroxysmal: Xarelto on hold for cath. Sinus this am. She is on Tikosyn and QTc is now 549. Will hold am dose. Will ask EP to comment on dosing this am. Will check mg today. Replaced mg yesterday.   MCALHANY,CHRISTOPHER  2/24/20167:03 AM

## 2014-03-21 NOTE — CV Procedure (Signed)
    Cardiac Catheterization Procedure Note  Name: [name] MRN: [id number] DOB: 04-18-1940  Procedure: Right Heart Cath, Left Heart Cath, Selective Coronary Angiography, LV angiography  Indication: Shortness of breath, chest pain concerning for USAP  Procedural Details:  The right wrist was then prepped, draped, and anesthetized with 1% lidocaine. Using the modified Seldinger technique a 5/6 French Slender sheath was placed in the right radial artery. Intra-arterial verapamil was administered through the radial artery sheath. IV heparin was administered after a JR4 catheter was advanced into the central aorta. Access of the RFA was obtained via a front wall venous puncture and a 7 Fr sheath was placed. A Swan-Ganz catheter was used for the right heart catheterization. Standard protocol was followed for recording of right heart pressures and sampling of oxygen saturations. Fick cardiac output was calculated. Standard Judkins catheters were used for selective coronary angiography and left ventriculography. There were no immediate procedural complications. The patient was transferred to the post catheterization recovery area for further monitoring.  Procedural Findings: Hemodynamics RA 10 RV 46/14 PA 43/9 mean 28 PCWP 17 LV 135/14 AO 128/59 mean 83  Oxygen saturations: PA 70 AO 96  Cardiac Output (Fick) 7.1  Cardiac Index (Fick) 3.6   Coronary angiography: Coronary dominance: right  Left mainstem: Widely patent, no obstruction  Left anterior descending (LAD): Mild diffuse mid-LAD stenosis. There is moderate calcification and the mid-vessel has diffuse 40-50% stenosis. The diagonal are patent and the first diagonal is a large vessel.   Left circumflex (LCx): There is a large intermediate branch without stenosis. The AV circumflex has an eccentric 50% stenosis unchanged from the previous study when the patient had a negative FFR.   Right coronary artery (RCA): The right cusp  is heavily calcified. The ostium of the RCA has 40% stenosis without catheter dampening. The vessel is a large, dominant RCA without significant obstruction. The PDA and PLA branches are patent.   Left ventriculography: There is mild inferior hypokinesis and the LVEF is estimated at 50-55%  Estimated Blood Loss: minimal  Final Conclusions:   1. Mild-moderate nonobstructive CAD unchanged from previous cardiac cath studies in 2014.  2. Low-normal LV systolic function 3. Mild elevation of right heart pressures with normal LV pressures, suspect diastolic dysfunction versus obesity-related. 4. Preserved cardiac output  Recommendations: Medical therapy  Sherren Mocha MD, [facility] 03/21/2014, 9:38 AM

## 2014-03-21 NOTE — Progress Notes (Signed)
Inpatient Diabetes Program Recommendations  AACE/ADA: New Consensus Statement on Inpatient Glycemic Control (2013)  Target Ranges:  Prepandial:   less than 140 mg/dL      Peak postprandial:   less than 180 mg/dL (1-2 hours)      Critically ill patients:  140 - 180 mg/dL   Inpatient Diabetes Program Recommendations Correction (SSI): add Novolog moderate scale TID + HS scale  Thank you  Raoul Pitch BSN, RN,CDE Inpatient Diabetes Coordinator [phone] (team pager)

## 2014-03-21 NOTE — Progress Notes (Addendum)
     SUBJECTIVE: No chest pain this am.   BP 119/56 mmHg  Pulse 66  Temp(Src) 98.1 F (36.7 C) (Oral)  Resp 18  Ht 5\' 2"  (1.575 m)  Wt 214 lb 1.6 oz (97.115 kg)  BMI 39.15 kg/m2  SpO2 95%  Intake/Output Summary (Last 24 hours) at 03/21/14 0703 Last data filed at 03/21/14 [id number]  Gross per 24 hour  Intake    660 ml  Output   2500 ml  Net  -1840 ml    PHYSICAL EXAM General: Well developed, well nourished, in no acute distress. Alert and oriented x 3.  Psych:  Good affect, responds appropriately Neck: No JVD. No masses noted.  Lungs: Clear bilaterally with no wheezes or rhonci noted.  Heart: RRR with no murmurs noted. Abdomen: Bowel sounds are present. Soft, non-tender.  Extremities: No lower extremity edema.   LABS: Basic Metabolic Panel:  Recent Labs  03/20/14 0250 03/20/14 1540 03/21/14 0500  NA 139  --  136  K 4.0  --  3.5  CL 94*  --  92*  CO2 32  --  31  GLUCOSE 111*  --  167*  BUN 18  --  20  CREATININE 1.21*  --  1.26*  CALCIUM 9.6  --  9.5  MG  --  1.9 2.0   CBC:  Recent Labs  03/19/14 1537 03/21/14 0500  WBC 9.2 8.1  NEUTROABS 5.0  --   HGB 10.4* 10.5*  HCT 35.2* 35.3*  MCV 80.4 78.4  PLT 256 277   Cardiac Enzymes:  Recent Labs  03/19/14 1537 03/19/14 2024 03/20/14 0250  TROPONINI <0.03 <0.03 <0.03   Current Meds: . aspirin EC  81 mg Oral Daily  . dofetilide  500 mcg Oral BID  . estradiol  1 mg Oral Daily  . furosemide  60 mg Intravenous BID  . glipiZIDE  10 mg Oral Q breakfast  . heparin  5,000 Units Subcutaneous 3 times per day  . insulin glargine  35 Units Subcutaneous BID  . levothyroxine  137 mcg Oral QAC breakfast  . magnesium oxide  400 mg Oral QID  . metoprolol tartrate  25 mg Oral BID  . pantoprazole  40 mg Oral Daily  . potassium chloride  20 mEq Oral Daily  . simvastatin  40 mg Oral q1800  . sodium chloride  3 mL Intravenous Q12H  . sodium chloride  3 mL Intravenous Q12H  . sodium chloride  3 mL Intravenous Q12H      ASSESSMENT AND PLAN:  1. CAD/Unstable angina: Troponin negative. Moderate CAD by cath December 2014. Now with chest pain c/w unstable angina. Xarelto on hold for cardiac cath. Will plan cardiac cath today. Right and left heart cath. Right heart cath to exclude pulm HTN per request of Dr. [name].  2. Acute diastolic CHF: Diuresing well with IV Lasix. Net negative 4.9 liters since admission. Will stop IV Lasix and will base dosing on RHC pressures  3. Atrial fibrillation, paroxysmal: Xarelto on hold for cath. Sinus this am. She is on Tikosyn and QTc is now 549. Will hold am dose. Will ask EP to comment on dosing this am. Will check mg today. Replaced mg yesterday.   [name]  2/24/20167:03 AM

## 2014-03-21 NOTE — Progress Notes (Signed)
Pt is post Rt. Radial cath today.  Dsg. To site remain D&I and level 0.  C/0 hand feels little numb.  Noted pt able to move her fingers and bend them well.  Encourage to continue elevate hand on the pillow.  Informed Barbaraann Rondo, RN in cath lab at [phone] and instructed that pt will experience some numbness and to continue to to monitor  and also Dr. Angelena Form made aware at 1645 and also instructed that pt will have the numbness for now which is normal.  Encourage pt to keep hand elevated on the pillow.  Daughter and son-in-law at bedside.  Also asked a staff member to assessed site and instructed pt site looks good no swelling, hematoma or bruising noted.  Will continue to monitor.  Karie Kirks, Therapist, sports.

## 2014-03-21 NOTE — Progress Notes (Signed)
EKGs are reviewed QTc has lengthened  Will reduce tikosyn to 375 mcg BID Keep K>3.9 and Mg >1.9   [name] MD 03/21/2014 8:41 AM

## 2014-03-21 NOTE — Interval H&P Note (Signed)
History and Physical Interval Note:  03/21/2014 8:53 AM  [name]  has presented today for surgery, with the diagnosis of cp  The various methods of treatment have been discussed with the patient and family. After consideration of risks, benefits and other options for treatment, the patient has consented to  Procedure(s): LEFT HEART CATHETERIZATION WITH CORONARY ANGIOGRAM (N/A) as a surgical intervention .  The patient's history has been reviewed, patient examined, no change in status, stable for surgery.  I have reviewed the patient's chart and labs.  Questions were answered to the patient's satisfaction.     Sherren Mocha

## 2014-03-22 ENCOUNTER — Encounter (HOSPITAL_COMMUNITY): Payer: Self-pay | Admitting: Physician Assistant

## 2014-03-22 ENCOUNTER — Encounter: Payer: Self-pay | Admitting: Internal Medicine

## 2014-03-22 DIAGNOSIS — I481 Persistent atrial fibrillation: Secondary | ICD-10-CM

## 2014-03-22 LAB — BASIC METABOLIC PANEL
Anion gap: 11 (ref 5–15)
Anion gap: 11 (ref 5–15)
BUN: 14 mg/dL (ref 6–23)
BUN: 13 mg/dL (ref 6–23)
CHLORIDE: 93 mmol/L — AB (ref 96–112)
CO2: 31 mmol/L (ref 19–32)
CO2: 27 mmol/L (ref 19–32)
Calcium: 9.4 mg/dL (ref 8.4–10.5)
Calcium: 9.1 mg/dL (ref 8.4–10.5)
Chloride: 93 mmol/L — ABNORMAL LOW (ref 96–112)
Creatinine, Ser: 1 mg/dL (ref 0.50–1.10)
Creatinine, Ser: 0.95 mg/dL (ref 0.50–1.10)
GFR calc Af Amer: 64 mL/min — ABNORMAL LOW (ref >90)
GFR calc Af Amer: 68 mL/min — ABNORMAL LOW (ref >90)
GFR calc non Af Amer: 55 mL/min — ABNORMAL LOW (ref >90)
GFR calc non Af Amer: 59 mL/min — ABNORMAL LOW (ref >90)
Glucose, Bld: 170 mg/dL — ABNORMAL HIGH (ref 70–99)
Glucose, Bld: 226 mg/dL — ABNORMAL HIGH (ref 70–99)
POTASSIUM: 4.4 mmol/L (ref 3.5–5.1)
Potassium: 4.5 mmol/L (ref 3.5–5.1)
Sodium: 135 mmol/L (ref 135–145)
Sodium: 131 mmol/L — ABNORMAL LOW (ref 135–145)

## 2014-03-22 LAB — GLUCOSE, CAPILLARY
GLUCOSE-CAPILLARY: 167 mg/dL — AB (ref 70–99)
Glucose-Capillary: 177 mg/dL — ABNORMAL HIGH (ref 70–99)
Glucose-Capillary: 184 mg/dL — ABNORMAL HIGH (ref 70–99)

## 2014-03-22 LAB — MAGNESIUM: Magnesium: 2.1 mg/dL (ref 1.5–2.5)

## 2014-03-22 MED ORDER — POTASSIUM CHLORIDE CRYS ER 20 MEQ PO TBCR: 20.0000 meq | EXTENDED_RELEASE_TABLET | Freq: Every day | ORAL | Status: DC

## 2014-03-22 MED ORDER — DOFETILIDE 125 MCG PO CAPS: 375.0000 ug | ORAL_CAPSULE | Freq: Two times a day (BID) | ORAL | Status: DC

## 2014-03-22 MED ORDER — RIVAROXABAN 20 MG PO TABS
20.0000 mg | ORAL_TABLET | Freq: Every day | ORAL | Status: DC
  Filled 2014-03-22: qty 1

## 2014-03-22 MED ORDER — FUROSEMIDE 40 MG PO TABS: 40.0000 mg | ORAL_TABLET | Freq: Every day | ORAL | Status: DC

## 2014-03-22 MED ORDER — CETYLPYRIDINIUM CHLORIDE 0.05 % MT LIQD
7.0000 mL | Freq: Two times a day (BID) | OROMUCOSAL | Status: DC
  Administered 2014-03-22: 7 mL via OROMUCOSAL

## 2014-03-22 MED ORDER — METFORMIN HCL ER 500 MG PO TB24: 500.0000 mg | ORAL_TABLET | Freq: Two times a day (BID) | ORAL | Status: DC

## 2014-03-22 NOTE — Discharge Summary (Signed)
Discharge Summary   Patient ID: [name] MRN: [id number], DOB/AGE: 04-10-1940 74 y.o. Admit date: 03/19/2014 D/C date:     03/22/2014  Primary Cardiologist: Dr. [name]   Principal Problem:   Acute on chronic diastolic ACC/AHA stage C congestive heart failure Active Problems:   Hypothyroidism   GASTROESOPHAGEAL REFLUX DISEASE, CHRONIC   Obesity   DM neuropathy, type II diabetes mellitus   Syncope   PAF (paroxysmal atrial fibrillation)   HTN (hypertension)   Hyperlipidemia   Chronic diastolic CHF (congestive heart failure)    Admission Dates: 03/19/14-03/22/14 Discharge Diagnosis: Acute on chronic diastolic CHF and chest pain s/p LHC with non-obst CAD stable from previous  HPI: [name] is a 74 y.o. female with a history of HTN, DM, PAF on xarelto, chronic diastolic CHF, non obst CAD, and multiple medicine allergies who was admitted from the office on 03/20/14 due to a multitude of sx including CP and SOB.   She has a history of palpitations and chest pain with a negative stress echo and unremarkable cath in 2014. She has DM, HTN, and PAF. Echo 12/14 showed an EF of 45 to 50% with grade 1 diastolic dysfunction. She has multiple allergies and intolerances noted - this includes NTG.  She was admitted in Dec. 2014 with syncope and chest pain. She was negative for MI. Cardiac cath was done which showed a 50-60% lesion in the LCx with normal FFR. Echo EF was unchanged. Carotid dopplers showed only mild disease. She had an implantable loop recorder place. On long termfollow up she was noted to have A flutter/afib 2-6% of the time. In April of 2015, she was loaded with Tikosyn. She was admitted in July 2015 with weight gain and swelling. Responded to diuresis. Diagnosed with diastolic CHF. Repeat Echo showed normal EF 55-60%. Grade 1 diastolic dysfunction. Last saw Dr. [name] in November of 2015 - complaining of persistent dyspnea. Seen in the AF clinic that day as well - no  changes noted to have been made. She was added to [facility] schedule 03/19/14 for worsening SOB and CP and it was decided to admit her to [facility].    Hospital Course  Non-obst CAD/Chest pain: Troponin negative. Moderate CAD by cath December 2014. Presented w/ chest pain c/w unstable angina. Xarelto held for cardiac cath. -- s/p [facility] 03/22/14 which revealed 1. Mild-moderate nonobstructive CAD unchanged from previous cardiac cath studies in 2014.  2. Low-normal LV systolic function 3. Mild elevation of right heart pressures with normal LV pressures, suspect diastolic dysfunction versus obesity-related. 4. Preserved cardiac output -- Medical therapy recommended. Continue statin, and lopressor 25mg  BID. NO asa as she is on Xarelto.  -- Radial site stable.   Acute on chronic diastolic CHF: Diuresed well with IV Lasix. Net negative 4.3L since admission. IV lasix stopped prior to [facility] with plans to resume dosing based on right heart cath pressures. This showed mild elevation of right heart pressures with normal LV pressures, suspect diastolic dysfunction versus obesity-related. Dyspnea felt to be due to obesity/deconditioning.  -- Oxygen desats to 88% on RA with ambulation. Will order home O2. -- Discharge weight 215lbs. Will discharge her on home dose of Lasix 40mg  po qd and she knows she can take extra if she has weight gain or s/s volume overload. She will resume 40mEq of Kdur   Atrial fibrillation, paroxysmal: -- Seen by EP who reduced Tikosyn to 375 mcg BID for prolonged QTc (549). This improved to QT/QTc 422/424  ms today.  -- Keep K 4.5 and Mg 2.1 -- Continue Xarelto -- Has follow up with Roderic Palau NP on 04/02/14  -- I have provided a written rx for 1 week of new tikosyn dosing that she will get from the outpatient pharmacy before being discharged (this was later filled at [facility] bc outpt pharmacy out of tikosyn). She has been instructed  to bring 500mg  tablets back to the office for disposal.   DM- Hg A1c 8.0. Apparantly daughter has lots of questions about diabetes medications (not in room currently). Will defer to PCP. -- Can restart her metformin tomorrow AM when >48 hours post cardiac cath.  The patient has had an uncomplicated hospital course and is recovering well. The radial catheter site is stable. She has been seen by Dr. Julianne Handler today and deemed ready for discharge home. All follow-up appointments have been scheduled. Discharge medications are listed below.   Discharge Vitals: Blood pressure 98/58, pulse 54, temperature 98 F (36.7 C), temperature source Oral, resp. rate 20, height 5\' 2"  (1.575 m), weight 215 lb 12.8 oz (97.886 kg), SpO2 99 %.  Labs: Lab Results  Component Value Date   WBC 8.1 03/21/2014   HGB 10.5* 03/21/2014   HCT 35.3* 03/21/2014   MCV 78.4 03/21/2014   PLT 277 03/21/2014    Recent Labs Lab 03/19/14 1537  03/22/14 1126  NA 137  < > 131*  K 4.2  < > 4.4  CL 96  < > 93*  CO2 30  < > 27  BUN 16  < > 13  CREATININE 1.51*  < > 0.95  CALCIUM 9.5  < > 9.1  PROT 7.2  --   --   BILITOT 0.5  --   --   ALKPHOS 58  --   --   ALT 16  --   --   AST 42*  --   --   GLUCOSE 135*  < > 226*  < > = values in this interval not displayed.  Recent Labs  03/19/14 1537 03/19/14 2024 03/20/14 0250  TROPONINI <0.03 <0.03 <0.03    Diagnostic Studies/Procedures   X-ray Chest Pa And Lateral  03/19/2014   CLINICAL DATA:  Chest pain for 2 days.  Dyspnea.  EXAM: CHEST  2 VIEW  COMPARISON:  None.  FINDINGS: Cardiac silhouette mildly enlarged. No mediastinal or hilar masses or evidence of adenopathy.  Clear lungs.  No pleural effusion or pneumothorax.  Bony thorax is demineralized but grossly intact.  IMPRESSION: No acute cardiopulmonary disease.   Electronically Signed   By: Lajean Manes M.D.   On: 03/19/2014 17:19    03/21/14    Cardiac Catheterization Procedure Note  Procedural  Findings: Hemodynamics RA 10 RV 46/14 PA 43/9 mean 28 PCWP 17 LV 135/14 AO 128/59 mean 83  Oxygen saturations: PA 70 AO 96  Cardiac Output (Fick) 7.1  Cardiac Index (Fick) 3.6  Final Conclusions:  1. Mild-moderate nonobstructive CAD unchanged from previous cardiac cath studies in 2014.  2. Low-normal LV systolic function 3. Mild elevation of right heart pressures with normal LV pressures, suspect diastolic dysfunction versus obesity-related. 4. Preserved cardiac output  Recommendations: Medical therapy   Discharge Medications     Medication List    TAKE these medications        ACCU-CHEK SMARTVIEW test strip  Generic drug:  glucose blood     acetaminophen 500 MG tablet  Commonly known as:  TYLENOL  Take 1,000 mg by mouth daily  as needed for moderate pain.     Cholecalciferol 5000 UNITS capsule  Take 5,000 Units by mouth daily.     clonazePAM 0.5 MG tablet  Commonly known as:  KLONOPIN  Take 0.25-0.5 mg by mouth 3 (three) times daily as needed for anxiety.     diphenhydrAMINE 25 MG tablet  Commonly known as:  BENADRYL  Take 25 mg by mouth every 6 (six) hours as needed for itching or allergies.     dofetilide 125 MCG capsule  Commonly known as:  TIKOSYN  Take 3 capsules (375 mcg total) by mouth 2 (two) times daily.     estradiol 1 MG tablet  Commonly known as:  ESTRACE  Take 1 mg by mouth daily.     furosemide 40 MG tablet  Commonly known as:  LASIX  Take 1 tablet (40 mg total) by mouth daily. May take extra if weight is up in afternoon     glipiZIDE 10 MG 24 hr tablet  Commonly known as:  GLUCOTROL XL  Take 10 mg by mouth daily with breakfast.     Insulin Glargine 100 UNIT/ML Solostar Pen  Commonly known as:  LANTUS  Inject 35 Units into the skin 2 (two) times daily.     lansoprazole 30 MG capsule  Commonly known as:  PREVACID  Take 1 capsule (30 mg total) by mouth daily at 12 noon.     levothyroxine 137 MCG tablet  Commonly known  as:  SYNTHROID, LEVOTHROID  Take 137 mcg by mouth daily before breakfast.     magnesium oxide 400 MG tablet  Commonly known as:  MAG-OX  Take 400 mg by mouth 4 (four) times daily.     metFORMIN 500 MG 24 hr tablet  Commonly known as:  GLUCOPHAGE-XR  Take 1 tablet (500 mg total) by mouth 2 (two) times daily.  Start taking on:  03/23/2014     metoprolol 50 MG tablet  Commonly known as:  LOPRESSOR  Take 0.5 tablets (25 mg total) by mouth 2 (two) times daily.     potassium chloride 10 MEQ tablet  Commonly known as:  K-DUR  Take 1 tablet (10 mEq total) by mouth every other day. Or as directed     promethazine 25 MG tablet  Commonly known as:  PHENERGAN  Take 25 mg by mouth every 6 (six) hours as needed for nausea or vomiting.     ROLAIDS PO  Take 1 tablet by mouth daily.     simethicone 125 MG chewable tablet  Commonly known as:  MYLICON  Chew 0000000 mg by mouth every 6 (six) hours as needed for flatulence.     simvastatin 40 MG tablet  Commonly known as:  ZOCOR  Take 40 mg by mouth every evening.     traMADol 50 MG tablet  Commonly known as:  ULTRAM  Take 50-100 mg by mouth every 6 (six) hours as needed for moderate pain.     vitamin B-12 1000 MCG tablet  Commonly known as:  CYANOCOBALAMIN  Take 1,000 mcg by mouth daily.     XARELTO 20 MG Tabs tablet  Generic drug:  rivaroxaban  TAKE ONE (1) TABLET EACH DAY        Disposition   The patient will be discharged in stable condition to home.  Follow-up Information    Follow up with CARROLL,DONNA, NP On 04/02/2014.   Specialty:  Nurse Practitioner   Why:  @ 10am   Contact information:   [address]  [place] [address] [phone]       Follow up with Peter [name], MD On 04/19/2014.   Specialty:  Cardiology   Why:  @ 11am   Contact information:   [address] [place] [address] [phone]         Duration of Discharge Encounter: Greater than 30 minutes including physician and PA  time.  SignedAngelena Form R PA-C 03/22/2014, 1:51 PM

## 2014-03-22 NOTE — Progress Notes (Signed)
Patient discharged home. Discharge instructions completed. Med list reviewed with patient. Verbalizes understanding.

## 2014-03-22 NOTE — Progress Notes (Signed)
Patient Name: [name] Date of Encounter: 03/22/2014     Active Problems:   Chest pain   Dyspnea   Acute on chronic diastolic ACC/AHA stage C congestive heart failure   Unstable angina    SUBJECTIVE  No CP or SOB. Feeling much better.   CURRENT MEDS . antiseptic oral rinse  7 mL Mouth Rinse BID  . aspirin EC  81 mg Oral Daily  . dofetilide  375 mcg Oral BID  . estradiol  1 mg Oral Daily  . glipiZIDE  10 mg Oral Q breakfast  . heparin  5,000 Units Subcutaneous 3 times per day  . insulin glargine  35 Units Subcutaneous BID  . levothyroxine  137 mcg Oral QAC breakfast  . magnesium oxide  400 mg Oral QID  . metoprolol tartrate  25 mg Oral BID  . pantoprazole  40 mg Oral Daily  . potassium chloride  20 mEq Oral Daily  . simvastatin  40 mg Oral q1800  . sodium chloride  3 mL Intravenous Q12H  . sodium chloride  3 mL Intravenous Q12H    OBJECTIVE  Filed Vitals:   03/21/14 2059 03/22/14 0202 03/22/14 0547 03/22/14 1000  BP: 130/60 117/54 109/58 98/58  Pulse: 68 53 58 54  Temp: 97.6 F (36.4 C) 97.8 F (36.6 C) 98 F (36.7 C)   TempSrc: Oral Oral Oral   Resp: 20 20 20    Height:      Weight:   215 lb 12.8 oz (97.886 kg)   SpO2: 100% 98% 99%     Intake/Output Summary (Last 24 hours) at 03/22/14 1057 Last data filed at 03/22/14 1002  Gross per 24 hour  Intake 2056.25 ml  Output   1450 ml  Net 606.25 ml   Filed Weights   03/20/14 0643 03/21/14 0557 03/22/14 0547  Weight: 218 lb 4.8 oz (99.02 kg) 214 lb 1.6 oz (97.115 kg) 215 lb 12.8 oz (97.886 kg)    PHYSICAL EXAM  General: Pleasant, NAD. Obese.  Neuro: Alert and oriented X 3. Moves all extremities spontaneously. Psych: Normal affect. HEENT:  Normal  Neck: Supple without bruits or JVD. Lungs:  Resp regular and unlabored, CTA. Heart: RRR no s3, s4, or murmurs. Abdomen: Soft, non-tender, non-distended, BS + x 4.  Extremities: No clubbing, cyanosis or edema. DP/PT/Radials 2+ and equal bilaterally.  With compression stocking on. Trace edema. Much improved.   Accessory Clinical Findings  CBC  Recent Labs  03/19/14 1537 03/21/14 0500  WBC 9.2 8.1  NEUTROABS 5.0  --   HGB 10.4* 10.5*  HCT 35.2* 35.3*  MCV 80.4 78.4  PLT 256 [id number]   Basic Metabolic Panel  Recent Labs  03/20/14 1540 03/21/14 0500 03/22/14 0354  NA  --  136 135  K  --  3.5 4.5  CL  --  92* 93*  CO2  --  31 31  GLUCOSE  --  167* 170*  BUN  --  20 14  CREATININE  --  1.26* 1.00  CALCIUM  --  9.5 9.4  MG 1.9 2.0  --    Liver Function Tests  Recent Labs  03/19/14 1537  AST 42*  ALT 16  ALKPHOS 58  BILITOT 0.5  PROT 7.2  ALBUMIN 3.8    Cardiac Enzymes  Recent Labs  03/19/14 1537 03/19/14 2024 03/20/14 0250  TROPONINI <0.03 <0.03 <0.03    Hemoglobin A1C  Recent Labs  03/19/14 1537  HGBA1C 8.0*    Thyroid Function  Tests  Recent Labs  03/19/14 1537  TSH 6.625*    TELE  NSR with some PVCs  Radiology/Studies  X-ray Chest Pa And Lateral  03/19/2014   CLINICAL DATA:  Chest pain for 2 days.  Dyspnea.  EXAM: CHEST  2 VIEW  COMPARISON:  None.  FINDINGS: Cardiac silhouette mildly enlarged. No mediastinal or hilar masses or evidence of adenopathy.  Clear lungs.  No pleural effusion or pneumothorax.  Bony thorax is demineralized but grossly intact.  IMPRESSION: No acute cardiopulmonary disease.   Electronically Signed   By: Lajean Manes M.D.   On: 03/19/2014 17:19    ASSESSMENT AND PLAN  [name] is a 74 y.o. female with a history of HTN, DM, PAF on xarelto, chronic diastolic CHF and multiple medicine allergies who was admitted from the office on 03/20/14 due to a multitude of sx including CP and SOB.   CAD/Unstable angina: Troponin negative. Moderate CAD by cath December 2014. Presented w/ chest pain c/w unstable angina. Xarelto held for cardiac cath. -- s/p [facility] 03/22/14 which revealed  1. Mild-moderate nonobstructive CAD unchanged from previous cardiac cath studies in  2014.   2. Low-normal LV systolic function  3. Mild elevation of right heart pressures with normal LV pressures, suspect diastolic dysfunction versus obesity-related.  4. Preserved cardiac output -- Medical therapy recommended. Continue statin, and lopressor 25mg  BID. NO asa as she is on Xarelto.  -- Radial site stable. Will resume xarelto this AM and DC heparin.   Acute diastolic CHF: Diuresed well with IV Lasix. Net negative 4.3L since admission. IV lasix stopped prior to [facility] with plans to resume dosing based on right heart cath pressures. Was on 40mg  Lasix qd at home. W -- Qualifies for home 02. Will place this order.   Atrial fibrillation, paroxysmal: Xarelto on hold for cath. Sinus this am.  -- Seen by EP who reduced Tikosyn to 375 mcg BID for prolonged QTc. This improved to QT/QTc 422/424 ms today.  -- Keep K>3.9 and Mg >1.  DM- Hg A1c 8.0. Apparantly daughter has lots of questions about diabetes medications (not in room currently). Will defer to PCP.  Judy Pimple PA-C  Pager [phone]  I have personally seen and examined this patient with Angelena Form, PA-C. I agree with the assessment and plan as outlined above. CAD is stable. Dyspnea likely due to obesity/deconditioning. Oxygen desats to 88% on RA with ambulation. Will order home O2. Tikosyn dose reduced due to QTc prolongation. Resume Xarelto. Discharge home today. Follow up with Dr. [name].   MCALHANY,CHRISTOPHER 03/22/2014 12:07 PM

## 2014-03-29 ENCOUNTER — Encounter: Payer: Self-pay | Admitting: Internal Medicine

## 2014-03-30 ENCOUNTER — Encounter: Payer: Self-pay | Admitting: Internal Medicine

## 2014-04-02 ENCOUNTER — Encounter: Payer: Self-pay | Admitting: Nurse Practitioner

## 2014-04-02 ENCOUNTER — Ambulatory Visit (INDEPENDENT_AMBULATORY_CARE_PROVIDER_SITE_OTHER): Payer: Medicare HMO | Admitting: Nurse Practitioner

## 2014-04-02 ENCOUNTER — Telehealth: Payer: Self-pay | Admitting: *Deleted

## 2014-04-02 VITALS — BP 112/68 | HR 60 | Ht 62.0 in | Wt 229.6 lb

## 2014-04-02 DIAGNOSIS — I5033 Acute on chronic diastolic (congestive) heart failure: Secondary | ICD-10-CM | POA: Diagnosis not present

## 2014-04-02 DIAGNOSIS — I480 Paroxysmal atrial fibrillation: Secondary | ICD-10-CM

## 2014-04-02 LAB — BASIC METABOLIC PANEL WITH GFR
BUN: 17 mg/dL (ref 6–23)
CO2: 33 meq/L — ABNORMAL HIGH (ref 19–32)
Calcium: 9.6 mg/dL (ref 8.4–10.5)
Chloride: 96 meq/L (ref 96–112)
Creatinine, Ser: 1.07 mg/dL (ref 0.40–1.20)
GFR: 53.65 mL/min — ABNORMAL LOW
Glucose, Bld: 159 mg/dL — ABNORMAL HIGH (ref 70–99)
Potassium: 4.5 meq/L (ref 3.5–5.1)
Sodium: 137 meq/L (ref 135–145)

## 2014-04-02 LAB — TSH: TSH: 5.96 u[IU]/mL — AB (ref 0.35–4.50)

## 2014-04-02 LAB — MAGNESIUM: Magnesium: 1.7 mg/dL (ref 1.5–2.5)

## 2014-04-02 MED ORDER — POTASSIUM CHLORIDE ER 10 MEQ PO TBCR: 10.0000 meq | EXTENDED_RELEASE_TABLET | ORAL | Status: DC

## 2014-04-02 MED ORDER — FUROSEMIDE 40 MG PO TABS: 40.0000 mg | ORAL_TABLET | ORAL | Status: DC

## 2014-04-02 NOTE — Telephone Encounter (Signed)
Notified pt of need to increase magnesium to 5 times per day for one week then recheck bmet and magnesium levels in 10 days. Patient appointment made. Verbalized understanding.
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
## 2014-04-02 NOTE — Patient Instructions (Addendum)
You have been referred to heart failure clinic - March 16th @ 1130. Parking garage code is [id number]. Darrick Grinder NP and Dr. Aundra Dubin    Your physician has recommended you make the following change in your medication:  1) increase lasix to 40mg  twice daily for 3 days 2)increase potassium to 36mEq twice daily for 3 days   Dr. Wilson Singer (diabetes) @ [phone]

## 2014-04-03 ENCOUNTER — Telehealth: Payer: Self-pay | Admitting: *Deleted

## 2014-04-03 NOTE — Telephone Encounter (Addendum)
Left message on daughters voicemail to call back - checking to see how [name] is feeling this morning and how weight was after increasing lasix   Daughter called back and stated pt had lost 2lbs since yesterday, stated she was still "searching for air" when doing house work. Encouraged her to tell her mother to conserve her energy - certain activities/exertion will fatigue her. Educated on fluid intake management - decrease amount of ice pt is eating during day as it is still considered fluid. Told to call back if any further problems or questions/concerns before appointment next week.

## 2014-04-05 ENCOUNTER — Telehealth: Payer: Self-pay | Admitting: Internal Medicine

## 2014-04-05 ENCOUNTER — Ambulatory Visit: Payer: Medicare HMO | Admitting: Physician Assistant

## 2014-04-05 NOTE — Telephone Encounter (Signed)
Daughter called back and her Mom took an extra fluid pill last night and her weight and swelling is better.  She is now down to 223 today and her breathing is better.  She is going to call back tomorrow if she feels she needs to be seen prior to the  weekend

## 2014-04-05 NOTE — Telephone Encounter (Signed)
Last night patient called her daughter to come up to her house because she could not breath and had swelling in feet and lower legs.  Her weight is now back up to 226.  She was down to 222 yesterday. Dr Burt Knack saw her in the hospital and told the daughter he was concerned about how much there was and where it was coming from.  I will add her on today at 2:30 to see Tenny Craw, PA.

## 2014-04-05 NOTE — Telephone Encounter (Signed)
New Message  Pt daughter wants to speak w/ Rn about pt's condition. Please call back and discuss.

## 2014-04-05 NOTE — Telephone Encounter (Signed)
error 

## 2014-04-11 ENCOUNTER — Encounter (HOSPITAL_COMMUNITY): Payer: Self-pay

## 2014-04-11 ENCOUNTER — Ambulatory Visit (HOSPITAL_BASED_OUTPATIENT_CLINIC_OR_DEPARTMENT_OTHER)
Admission: RE | Admit: 2014-04-11 | Discharge: 2014-04-11 | Disposition: A | Discharge status: Home / Self Care | Payer: Medicare HMO | Source: Ambulatory Visit | Attending: Internal Medicine | Admitting: Internal Medicine

## 2014-04-11 VITALS — BP 112/68 | HR 74 | Wt 226.5 lb

## 2014-04-11 DIAGNOSIS — I480 Paroxysmal atrial fibrillation: Secondary | ICD-10-CM

## 2014-04-11 DIAGNOSIS — I5032 Chronic diastolic (congestive) heart failure: Secondary | ICD-10-CM | POA: Diagnosis not present

## 2014-04-11 DIAGNOSIS — R0602 Shortness of breath: Secondary | ICD-10-CM | POA: Diagnosis not present

## 2014-04-11 DIAGNOSIS — J9610 Chronic respiratory failure, unspecified whether with hypoxia or hypercapnia: Secondary | ICD-10-CM

## 2014-04-11 DIAGNOSIS — R0600 Dyspnea, unspecified: Secondary | ICD-10-CM

## 2014-04-11 DIAGNOSIS — I130 Hypertensive heart and chronic kidney disease with heart failure and stage 1 through stage 4 chronic kidney disease, or unspecified chronic kidney disease: Secondary | ICD-10-CM | POA: Diagnosis not present

## 2014-04-11 DIAGNOSIS — I5033 Acute on chronic diastolic (congestive) heart failure: Secondary | ICD-10-CM | POA: Insufficient documentation

## 2014-04-11 LAB — BASIC METABOLIC PANEL
Anion gap: 11 (ref 5–15)
BUN: 18 mg/dL (ref 6–23)
CHLORIDE: 98 mmol/L (ref 96–112)
CO2: 28 mmol/L (ref 19–32)
Calcium: 9.5 mg/dL (ref 8.4–10.5)
Creatinine, Ser: 1.05 mg/dL (ref 0.50–1.10)
GFR calc Af Amer: 60 mL/min — ABNORMAL LOW (ref >90)
GFR calc non Af Amer: 52 mL/min — ABNORMAL LOW (ref >90)
GLUCOSE: 226 mg/dL — AB (ref 70–99)
POTASSIUM: 3.8 mmol/L (ref 3.5–5.1)
SODIUM: 137 mmol/L (ref 135–145)

## 2014-04-11 LAB — BRAIN NATRIURETIC PEPTIDE: B Natriuretic Peptide: 422 pg/mL — ABNORMAL HIGH (ref 0.0–100.0)

## 2014-04-11 MED ORDER — FUROSEMIDE 10 MG/ML IJ SOLN
80.0000 mg | Freq: Once | INTRAMUSCULAR | Status: AC
Start: 2014-04-11 — End: 2014-04-11

## 2014-04-11 MED ORDER — POTASSIUM CHLORIDE CRYS ER 20 MEQ PO TBCR: 20.0000 meq | EXTENDED_RELEASE_TABLET | Freq: Once | ORAL | Status: AC

## 2014-04-11 MED ORDER — TORSEMIDE 20 MG PO TABS: ORAL_TABLET | ORAL | Status: DC

## 2014-04-11 MED ORDER — FUROSEMIDE 10 MG/ML IJ SOLN
80.0000 mg | Freq: Once | INTRAMUSCULAR | Status: AC
  Administered 2014-04-11: 80 mg via INTRAVENOUS
  Filled 2014-04-11: qty 8

## 2014-04-11 MED ORDER — POTASSIUM CHLORIDE CRYS ER 20 MEQ PO TBCR
20.0000 meq | EXTENDED_RELEASE_TABLET | Freq: Once | ORAL | Status: AC
  Administered 2014-04-11: 20 meq via ORAL
  Filled 2014-04-11: qty 1

## 2014-04-11 NOTE — Progress Notes (Signed)
Patient ID: [name], female   DOB: Dec 28, 1940, 74 y.o.   MRN: [id number]  PCP: Allison Graves, DO Primary Cardiologist: Dr [name]  HPI: [name] is a 75 y.o. female with a history of HTN, DM, PAF on xarelto and Tikosyn, chronic diastolic CHF, non obst CAD, and multiple medicine allergies . Most recent cath 2014 was ok.   Admitted 03/20/14 due to a multitude of sx including CP and SOB,fluid overload. She was diuresed and placed on lasix 40 mg daily.  Discharge weight was 215 pounds.   Today she is referred to HF clinic by Allison Devoid NP due to ongoing weight gain despite increasing diuretics at home.  Increased dyspnea with exertion. Limited mobility due to neuropathy. Wears 2 liters oxygen.  Sleeping in a recliner for the 2 years. Weight at home 222-227 pounds. Increase leg edema. Eating ice all day long. Lives with her son. She has takes care of her medications.    Echo 12/14 showed an EF of 45 to 50% with grade 1 diastolic dysfunction  RHC/LHC 03/21/2014 RA 10 RV 46/14 PA 43/9 mean 28 PCWP 17 LV 135/14 AO 128/59 mean 83 Oxygen saturations: PA 70 AO 96 Cardiac Output (Fick) 7.1  Cardiac Index (Fick) 3.6 Left anterior descending (LAD): Mild diffuse mid-LAD stenosis. There is moderate calcification and the mid-vessel has diffuse 40-50% stenosis. The diagonal are patent and the first diagonal is a large vessel.  Left circumflex (LCx): There is a large intermediate branch without stenosis. The AV circumflex has an eccentric 50% stenosis unchanged from the previous study when the patient had a negative FFR.  Right coronary artery (RCA): The right cusp is heavily calcified. The ostium of the RCA has 40% stenosis without catheter dampening. The vessel is a large, dominant RCA without significant obstruction. The PDA and PLA branches are patent.  Left ventriculography: There is mild inferior hypokinesis and the LVEF is estimated at 50-55%  ROS: All systems negative  except as listed in HPI, PMH and Problem List.  SH:  History   Social History  . Marital Status: Widowed    Spouse Name: N/A  . Number of Children: 2  . Years of Education: N/A   Occupational History  . Retired    Social History Main Topics  . Smoking status: Never Smoker   . Smokeless tobacco: Never Used  . Alcohol Use: No  . Drug Use: No  . Sexual Activity: No   Other Topics Concern  . Not on file   Social History Narrative   ** Merged History Encounter **       Divorced   3 children, 1 deceased    FH:  Family History  Problem Relation Age of Onset  . Heart attack Mother   . Diabetes Mother   . Colon cancer Father   . Esophageal cancer Father   . Kidney cancer Father   . Diabetes Father   . Ovarian cancer Sister   . Liver cancer Sister   . Breast cancer Sister   . Colon cancer Son   . Colon polyps Son   . Diabetes Sister   . Irritable bowel syndrome Sister   . Myocarditis Brother   . Rectal cancer Neg Hx   . Stomach cancer Neg Hx     Past Medical History  Diagnosis Date  . Hypertensive heart disease   . Hyperlipidemia   . Hypothyroidism   . Obesity (BMI 30-39.9)   . GERD (gastroesophageal reflux disease)   .  Degenerative disc disease, cervical   . Asthma   . Vitamin D deficiency   . Depression   . Bell's palsy   . Gastroparesis   . Internal hemorrhoid   . Hiatal hernia   . Anxiety   . Osteoarthritis   . Adenomatous colon polyp 02/13/09  . Chronic headaches   . Diverticulosis   . Status post dilation of esophageal narrowing   . IBS (irritable bowel syndrome)   . PAF (paroxysmal atrial fibrillation)     a. chronic xarelto and t  . Diabetes mellitus without complication   . Diverticulitis   . Syncope     a. 12/2012: MDT Reveal LINQ ILR placed;  b. 12/2012 Echo: EF 45-50%, Gr 3 DD, mild MR, mildly dil LA;  c. 12/2012 Carotid U/S: 1-39% bilat ICA stenosis.  Marland Kitchen CAD (coronary artery disease)     a. 12/2012 Nonobstructive by cath: LM nl, LAD  50p/m, LCX 50-1m (FFR 0.93), RCA min irregs, EF 55-65%-->Med Rx.  Marland Kitchen Neuropathy   . Atrial flutter     a. By ILR interrogation.  . SVT (supraventricular tachycardia)     a. By ILR interrogation.  Marland Kitchen NICM (nonischemic cardiomyopathy)     a. EF 45% with grade 3 diastolic dysfunction by echo 12/2012.  Marland Kitchen Complication of anesthesia   . PONV (postoperative nausea and vomiting)   . CHF (congestive heart failure)     Current Outpatient Prescriptions  Medication Sig Dispense Refill  . ACCU-CHEK SMARTVIEW test strip   11  . acetaminophen (TYLENOL) 500 MG tablet Take 1,000 mg by mouth daily as needed for moderate pain.     . Ca Carbonate-Mag Hydroxide (ROLAIDS PO) Take 1 tablet by mouth daily.    . Cholecalciferol 5000 UNITS capsule Take 5,000 Units by mouth daily.    . clonazePAM (KLONOPIN) 0.5 MG tablet Take 0.25-0.5 mg by mouth 3 (three) times daily as needed for anxiety.     . diphenhydrAMINE (BENADRYL) 25 MG tablet Take 25 mg by mouth every 6 (six) hours as needed for itching or allergies.     Marland Kitchen dofetilide (TIKOSYN) 125 MCG capsule Take 3 capsules (375 mcg total) by mouth 2 (two) times daily. 540 capsule 11  . estradiol (ESTRACE) 1 MG tablet Take 1 mg by mouth daily.    . furosemide (LASIX) 40 MG tablet Take 1 tablet (40 mg total) by mouth as directed. Increase to 40mg  twice daily for 3 days then return to 40mg  daily with extra dose if weight up 60 tablet 5  . glipiZIDE (GLUCOTROL XL) 10 MG 24 hr tablet Take 10 mg by mouth daily with breakfast.    . Insulin Glargine (LANTUS) 100 UNIT/ML Solostar Pen Inject 35 Units into the skin 2 (two) times daily.     . lansoprazole (PREVACID) 30 MG capsule Take 1 capsule (30 mg total) by mouth daily at 12 noon. 30 capsule 11  . levothyroxine (SYNTHROID, LEVOTHROID) 137 MCG tablet Take 137 mcg by mouth daily before breakfast.    . magnesium oxide (MAG-OX) 400 MG tablet Take 400 mg by mouth as directed. Take 400mg  5 times per day for 1 week then back to 4 times  per day    . metFORMIN (GLUCOPHAGE-XR) 500 MG 24 hr tablet Take 1 tablet (500 mg total) by mouth 2 (two) times daily. 60 tablet 11  . metoprolol (LOPRESSOR) 50 MG tablet Take 0.5 tablets (25 mg total) by mouth 2 (two) times daily. 150 tablet 5  . potassium chloride (  K-DUR) 10 MEQ tablet Take 1 tablet (10 mEq total) by mouth as directed. Increase to 65mEq twice daily for 3 days then return to 27mEq every other day. 30 tablet 11  . promethazine (PHENERGAN) 25 MG tablet Take 25 mg by mouth every 6 (six) hours as needed for nausea or vomiting.    . simethicone (MYLICON) 0000000 MG chewable tablet Chew 125 mg by mouth every 6 (six) hours as needed for flatulence.    . simvastatin (ZOCOR) 40 MG tablet Take 40 mg by mouth every evening.    . traMADol (ULTRAM) 50 MG tablet Take 50-100 mg by mouth every 6 (six) hours as needed for moderate pain.     . vitamin B-12 (CYANOCOBALAMIN) 1000 MCG tablet Take 1,000 mcg by mouth daily.    Alveda Reasons 20 MG TABS tablet TAKE ONE (1) TABLET EACH DAY 30 tablet 5   No current facility-administered medications for this encounter.    Filed Vitals:   04/11/14 1145  BP: 112/68  Pulse: 74  Weight: 226 lb 8 oz (102.74 kg)  SpO2: 93%    PHYSICAL EXAM: General:  Chronically ill appearing. Arrived in wheelchair. No resp  difficulty HEENT: normal Neck: supple. JVP 10-11. Carotids 2+ bilaterally; no bruits. No lymphadenopathy or thryomegaly appreciated. Cor: PMI normal. Regular rate & rhythm. No rubs, gallops or murmurs. Lungs: RML RLL LLL crackles. On 2 liters [place]  Abdomen: obese, soft, nontender, nondistended. No hepatosplenomegaly. No bruits or masses. Good bowel sounds. Extremities: no cyanosis, clubbing, rash, R and LLE 3+edema Neuro: alert & orientedx3, cranial nerves grossly intact. Moves all 4 extremities w/o difficulty. Affect pleasant.     ASSESSMENT & PLAN:  1. Acute/Chronic Diastolic Heart Failure  NYHA III. Volume status elevated.due to excessive fluid  intake.  Today will give 80 mg IV lasix + 20 meq potassium in the HF clinic Stop lasix and switch to torsemide 40 mg in am and 20 mg in pm.  Extensive education on low salt food choices, limiting ice intake, and weigh and record daily.  2. PAF - Per A fib clinic.  3. Chronic respiratory failure- on 2 liters [place] daily.    Follow up on Friday to reassess volume.   [name],Allison 12:35 PM   Patient seen and examined with Allison Grinder, NP. We discussed all aspects of the encounter. I agree with the assessment and plan as stated above.   She is markedly volume overloaded in setting of severe dietary noncompliance in particular eating lots of ice. Despite extensive discussions she does not seem very motivated to make real changes. We discussed use of sliding scale diuretics as well as hard candy to keep her mouth moist. We gave 80mg  IV lasix in clinic and switched lasix to torsemide. Will continue to follow closely and educate. Labs drawn in clinic.   Total time spent 45 minutes. Over half that time spent discussing above.   [name] 9:31 PM

## 2014-04-11 NOTE — Patient Instructions (Addendum)
STOP Lasix (Furosemide).  START torsemide 40mg  (2 tabs) in am and 20mg  (1 tab) in pm.  Follow up THIS Friday at 9:20 am. Garage Code: [id number]  Do the following things EVERYDAY: 1) Weigh yourself in the morning before breakfast. Write it down and keep it in a log. 2) Take your medicines as prescribed 3) Eat low salt foods-Limit salt (sodium) to 2000 mg per day.  4) Stay as active as you can everyday 5) Limit all fluids for the day to less than 2 liters

## 2014-04-11 NOTE — Progress Notes (Addendum)
Patient seen in [facility] Clinic for new patient appointment per Roderic Palau, volume overloaded.  Per Dr. Haroldine Laws, 24 g PIV started in LLFA x 2 attempts, patient tolerated well.  80 mg IV lasix administered slowly over 4 minutes and flushed through and clamped.  Patient also given 20 meq potassium by mouth.  Bedside commode in room, call bell in reach, and son in law at patient's side.  Will monitor closely.  Total Urinary Output: 1,000cc clear nonodorous yellow urine  Renee Pain

## 2014-04-12 ENCOUNTER — Encounter: Payer: Self-pay | Admitting: Internal Medicine

## 2014-04-12 ENCOUNTER — Emergency Department (HOSPITAL_COMMUNITY): Payer: Medicare HMO

## 2014-04-12 ENCOUNTER — Telehealth (HOSPITAL_COMMUNITY): Payer: Self-pay | Admitting: *Deleted

## 2014-04-12 ENCOUNTER — Inpatient Hospital Stay (HOSPITAL_COMMUNITY)
Admission: EM | Admit: 2014-04-12 | Discharge: 2014-04-19 | DRG: 291 | Disposition: A | Discharge status: Home / Self Care | Payer: Medicare HMO | Attending: Family Medicine | Admitting: Family Medicine

## 2014-04-12 DIAGNOSIS — I5032 Chronic diastolic (congestive) heart failure: Secondary | ICD-10-CM | POA: Diagnosis present

## 2014-04-12 DIAGNOSIS — Z888 Allergy status to other drugs, medicaments and biological substances status: Secondary | ICD-10-CM

## 2014-04-12 DIAGNOSIS — E860 Dehydration: Secondary | ICD-10-CM | POA: Diagnosis present

## 2014-04-12 DIAGNOSIS — E785 Hyperlipidemia, unspecified: Secondary | ICD-10-CM | POA: Diagnosis present

## 2014-04-12 DIAGNOSIS — I959 Hypotension, unspecified: Secondary | ICD-10-CM | POA: Diagnosis present

## 2014-04-12 DIAGNOSIS — Z9102 Food additives allergy status: Secondary | ICD-10-CM

## 2014-04-12 DIAGNOSIS — E669 Obesity, unspecified: Secondary | ICD-10-CM | POA: Diagnosis present

## 2014-04-12 DIAGNOSIS — Z90710 Acquired absence of both cervix and uterus: Secondary | ICD-10-CM

## 2014-04-12 DIAGNOSIS — I2510 Atherosclerotic heart disease of native coronary artery without angina pectoris: Secondary | ICD-10-CM | POA: Diagnosis present

## 2014-04-12 DIAGNOSIS — I4581 Long QT syndrome: Secondary | ICD-10-CM | POA: Diagnosis present

## 2014-04-12 DIAGNOSIS — F419 Anxiety disorder, unspecified: Secondary | ICD-10-CM | POA: Diagnosis present

## 2014-04-12 DIAGNOSIS — Z881 Allergy status to other antibiotic agents status: Secondary | ICD-10-CM

## 2014-04-12 DIAGNOSIS — I471 Supraventricular tachycardia: Secondary | ICD-10-CM | POA: Diagnosis present

## 2014-04-12 DIAGNOSIS — Z87442 Personal history of urinary calculi: Secondary | ICD-10-CM

## 2014-04-12 DIAGNOSIS — R509 Fever, unspecified: Secondary | ICD-10-CM | POA: Insufficient documentation

## 2014-04-12 DIAGNOSIS — Z7901 Long term (current) use of anticoagulants: Secondary | ICD-10-CM

## 2014-04-12 DIAGNOSIS — N289 Disorder of kidney and ureter, unspecified: Secondary | ICD-10-CM | POA: Insufficient documentation

## 2014-04-12 DIAGNOSIS — J101 Influenza due to other identified influenza virus with other respiratory manifestations: Secondary | ICD-10-CM | POA: Diagnosis present

## 2014-04-12 DIAGNOSIS — I4891 Unspecified atrial fibrillation: Secondary | ICD-10-CM | POA: Diagnosis present

## 2014-04-12 DIAGNOSIS — I248 Other forms of acute ischemic heart disease: Secondary | ICD-10-CM | POA: Diagnosis present

## 2014-04-12 DIAGNOSIS — I130 Hypertensive heart and chronic kidney disease with heart failure and stage 1 through stage 4 chronic kidney disease, or unspecified chronic kidney disease: Principal | ICD-10-CM | POA: Diagnosis present

## 2014-04-12 DIAGNOSIS — Z6841 Body Mass Index (BMI) 40.0 and over, adult: Secondary | ICD-10-CM

## 2014-04-12 DIAGNOSIS — K219 Gastro-esophageal reflux disease without esophagitis: Secondary | ICD-10-CM | POA: Diagnosis present

## 2014-04-12 DIAGNOSIS — Z880 Allergy status to penicillin: Secondary | ICD-10-CM

## 2014-04-12 DIAGNOSIS — F329 Major depressive disorder, single episode, unspecified: Secondary | ICD-10-CM | POA: Diagnosis present

## 2014-04-12 DIAGNOSIS — E876 Hypokalemia: Secondary | ICD-10-CM | POA: Diagnosis present

## 2014-04-12 DIAGNOSIS — M1990 Unspecified osteoarthritis, unspecified site: Secondary | ICD-10-CM | POA: Diagnosis present

## 2014-04-12 DIAGNOSIS — E1169 Type 2 diabetes mellitus with other specified complication: Secondary | ICD-10-CM | POA: Diagnosis present

## 2014-04-12 DIAGNOSIS — N179 Acute kidney failure, unspecified: Secondary | ICD-10-CM | POA: Diagnosis present

## 2014-04-12 DIAGNOSIS — E039 Hypothyroidism, unspecified: Secondary | ICD-10-CM | POA: Diagnosis present

## 2014-04-12 DIAGNOSIS — E11649 Type 2 diabetes mellitus with hypoglycemia without coma: Secondary | ICD-10-CM | POA: Diagnosis present

## 2014-04-12 DIAGNOSIS — J45909 Unspecified asthma, uncomplicated: Secondary | ICD-10-CM | POA: Diagnosis present

## 2014-04-12 DIAGNOSIS — Z9049 Acquired absence of other specified parts of digestive tract: Secondary | ICD-10-CM | POA: Diagnosis present

## 2014-04-12 DIAGNOSIS — I429 Cardiomyopathy, unspecified: Secondary | ICD-10-CM | POA: Diagnosis present

## 2014-04-12 DIAGNOSIS — D649 Anemia, unspecified: Secondary | ICD-10-CM | POA: Diagnosis present

## 2014-04-12 DIAGNOSIS — I482 Chronic atrial fibrillation: Secondary | ICD-10-CM | POA: Diagnosis present

## 2014-04-12 DIAGNOSIS — Z9981 Dependence on supplemental oxygen: Secondary | ICD-10-CM

## 2014-04-12 DIAGNOSIS — Z86010 Personal history of colonic polyps: Secondary | ICD-10-CM

## 2014-04-12 DIAGNOSIS — I480 Paroxysmal atrial fibrillation: Secondary | ICD-10-CM | POA: Diagnosis present

## 2014-04-12 DIAGNOSIS — E861 Hypovolemia: Secondary | ICD-10-CM | POA: Diagnosis present

## 2014-04-12 DIAGNOSIS — G629 Polyneuropathy, unspecified: Secondary | ICD-10-CM | POA: Diagnosis present

## 2014-04-12 DIAGNOSIS — N189 Chronic kidney disease, unspecified: Secondary | ICD-10-CM | POA: Diagnosis present

## 2014-04-12 DIAGNOSIS — I5043 Acute on chronic combined systolic (congestive) and diastolic (congestive) heart failure: Secondary | ICD-10-CM | POA: Diagnosis present

## 2014-04-12 DIAGNOSIS — E559 Vitamin D deficiency, unspecified: Secondary | ICD-10-CM | POA: Diagnosis present

## 2014-04-12 DIAGNOSIS — J111 Influenza due to unidentified influenza virus with other respiratory manifestations: Secondary | ICD-10-CM

## 2014-04-12 DIAGNOSIS — Z794 Long term (current) use of insulin: Secondary | ICD-10-CM

## 2014-04-12 LAB — TROPONIN I
TROPONIN I: 0.07 ng/mL — AB (ref <0.031)
Troponin I: 0.07 ng/mL — ABNORMAL HIGH (ref <0.031)
Troponin I: 0.06 ng/mL — ABNORMAL HIGH (ref <0.031)

## 2014-04-12 LAB — BASIC METABOLIC PANEL
Anion gap: 10 (ref 5–15)
BUN: 21 mg/dL (ref 6–23)
CHLORIDE: 93 mmol/L — AB (ref 96–112)
CO2: 34 mmol/L — ABNORMAL HIGH (ref 19–32)
Calcium: 9.7 mg/dL (ref 8.4–10.5)
Creatinine, Ser: 1.24 mg/dL — ABNORMAL HIGH (ref 0.50–1.10)
GFR, EST AFRICAN AMERICAN: 49 mL/min — AB (ref >90)
GFR, EST NON AFRICAN AMERICAN: 43 mL/min — AB (ref >90)
Glucose, Bld: 110 mg/dL — ABNORMAL HIGH (ref 70–99)
Potassium: 4 mmol/L (ref 3.5–5.1)
Sodium: 137 mmol/L (ref 135–145)

## 2014-04-12 LAB — I-STAT CG4 LACTIC ACID, ED
LACTIC ACID, VENOUS: 1.91 mmol/L (ref 0.5–2.0)
Lactic Acid, Venous: 1.17 mmol/L (ref 0.5–2.0)

## 2014-04-12 LAB — GLUCOSE, CAPILLARY
GLUCOSE-CAPILLARY: 57 mg/dL — AB (ref 70–99)
GLUCOSE-CAPILLARY: 77 mg/dL (ref 70–99)

## 2014-04-12 LAB — URINALYSIS, ROUTINE W REFLEX MICROSCOPIC
Bilirubin Urine: NEGATIVE
GLUCOSE, UA: NEGATIVE mg/dL
Hgb urine dipstick: NEGATIVE
KETONES UR: NEGATIVE mg/dL
Leukocytes, UA: NEGATIVE
Nitrite: NEGATIVE
PH: 5 (ref 5.0–8.0)
Protein, ur: 30 mg/dL — AB
Specific Gravity, Urine: 1.014 (ref 1.005–1.030)
UROBILINOGEN UA: 0.2 mg/dL (ref 0.0–1.0)

## 2014-04-12 LAB — URINE MICROSCOPIC-ADD ON

## 2014-04-12 LAB — CBC
HCT: 34.3 % — ABNORMAL LOW (ref 36.0–46.0)
Hemoglobin: 9.9 g/dL — ABNORMAL LOW (ref 12.0–15.0)
MCH: 23 pg — AB (ref 26.0–34.0)
MCHC: 28.9 g/dL — ABNORMAL LOW (ref 30.0–36.0)
MCV: 79.6 fL (ref 78.0–100.0)
PLATELETS: 195 10*3/uL (ref 150–400)
RBC: 4.31 MIL/uL (ref 3.87–5.11)
RDW: 17.6 % — ABNORMAL HIGH (ref 11.5–15.5)
WBC: 5.3 10*3/uL (ref 4.0–10.5)

## 2014-04-12 LAB — I-STAT TROPONIN, ED: Troponin i, poc: 0.05 ng/mL (ref 0.00–0.08)

## 2014-04-12 LAB — BRAIN NATRIURETIC PEPTIDE: B NATRIURETIC PEPTIDE 5: 447.7 pg/mL — AB (ref 0.0–100.0)

## 2014-04-12 MED ORDER — RIVAROXABAN 20 MG PO TABS
20.0000 mg | ORAL_TABLET | Freq: Every day | ORAL | Status: DC
  Administered 2014-04-13 – 2014-04-18 (×6): 20 mg via ORAL
  Filled 2014-04-12 (×6): qty 1

## 2014-04-12 MED ORDER — ACETAMINOPHEN 325 MG PO TABS
ORAL_TABLET | ORAL | Status: AC
  Filled 2014-04-12: qty 2

## 2014-04-12 MED ORDER — VITAMIN D 1000 UNITS PO TABS
5000.0000 [IU] | ORAL_TABLET | Freq: Every day | ORAL | Status: DC
  Administered 2014-04-13 – 2014-04-19 (×7): 5000 [IU] via ORAL
  Filled 2014-04-12 (×7): qty 5

## 2014-04-12 MED ORDER — AZITHROMYCIN 250 MG PO TABS
250.0000 mg | ORAL_TABLET | Freq: Every day | ORAL | Status: DC
  Administered 2014-04-14 – 2014-04-16 (×3): 250 mg via ORAL
  Filled 2014-04-12 (×3): qty 1

## 2014-04-12 MED ORDER — TRAMADOL HCL 50 MG PO TABS
50.0000 mg | ORAL_TABLET | Freq: Four times a day (QID) | ORAL | Status: DC | PRN
  Administered 2014-04-17: 50 mg via ORAL
  Filled 2014-04-12: qty 1

## 2014-04-12 MED ORDER — SIMVASTATIN 40 MG PO TABS
40.0000 mg | ORAL_TABLET | Freq: Every evening | ORAL | Status: DC
  Administered 2014-04-12 – 2014-04-18 (×7): 40 mg via ORAL
  Filled 2014-04-12 (×7): qty 1

## 2014-04-12 MED ORDER — SODIUM CHLORIDE 0.9 % IV BOLUS (SEPSIS)
250.0000 mL | Freq: Once | INTRAVENOUS | Status: AC
  Administered 2014-04-12: 250 mL via INTRAVENOUS

## 2014-04-12 MED ORDER — ADULT MULTIVITAMIN W/MINERALS CH: 1.0000 | ORAL_TABLET | Freq: Every day | ORAL | Status: DC

## 2014-04-12 MED ORDER — FOLIC ACID 1 MG PO TABS: 1.0000 mg | ORAL_TABLET | Freq: Every day | ORAL | Status: DC

## 2014-04-12 MED ORDER — ACETAMINOPHEN 325 MG PO TABS
650.0000 mg | ORAL_TABLET | Freq: Four times a day (QID) | ORAL | Status: DC | PRN
  Administered 2014-04-14: 650 mg via ORAL
  Filled 2014-04-12: qty 2

## 2014-04-12 MED ORDER — AZITHROMYCIN 500 MG PO TABS
500.0000 mg | ORAL_TABLET | Freq: Every day | ORAL | Status: AC
  Administered 2014-04-13: 500 mg via ORAL
  Filled 2014-04-12: qty 1

## 2014-04-12 MED ORDER — VITAMIN B-1 100 MG PO TABS
100.0000 mg | ORAL_TABLET | Freq: Every day | ORAL | Status: DC
  Administered 2014-04-12 – 2014-04-19 (×8): 100 mg via ORAL
  Filled 2014-04-12 (×8): qty 1

## 2014-04-12 MED ORDER — ESTRADIOL 1 MG PO TABS
1.0000 mg | ORAL_TABLET | Freq: Every day | ORAL | Status: DC
  Administered 2014-04-13 – 2014-04-19 (×7): 1 mg via ORAL
  Filled 2014-04-12 (×7): qty 1

## 2014-04-12 MED ORDER — DOCUSATE SODIUM 100 MG PO CAPS
100.0000 mg | ORAL_CAPSULE | Freq: Two times a day (BID) | ORAL | Status: DC
  Administered 2014-04-12 – 2014-04-17 (×6): 100 mg via ORAL
  Filled 2014-04-12 (×12): qty 1

## 2014-04-12 MED ORDER — INSULIN GLARGINE 100 UNIT/ML ~~LOC~~ SOLN
35.0000 [IU] | Freq: Two times a day (BID) | SUBCUTANEOUS | Status: DC
  Filled 2014-04-12 (×3): qty 0.35

## 2014-04-12 MED ORDER — CLONAZEPAM 0.5 MG PO TABS
0.2500 mg | ORAL_TABLET | Freq: Three times a day (TID) | ORAL | Status: DC | PRN
  Administered 2014-04-14 – 2014-04-19 (×5): 0.5 mg via ORAL
  Filled 2014-04-12 (×5): qty 1

## 2014-04-12 MED ORDER — GLIPIZIDE ER 10 MG PO TB24
10.0000 mg | ORAL_TABLET | Freq: Every day | ORAL | Status: DC
  Filled 2014-04-12 (×2): qty 1

## 2014-04-12 MED ORDER — SIMETHICONE 80 MG PO CHEW
80.0000 mg | CHEWABLE_TABLET | Freq: Four times a day (QID) | ORAL | Status: DC | PRN
  Filled 2014-04-12 (×2): qty 1

## 2014-04-12 MED ORDER — FOLIC ACID 1 MG PO TABS
1.0000 mg | ORAL_TABLET | Freq: Every day | ORAL | Status: DC
  Administered 2014-04-12 – 2014-04-19 (×8): 1 mg via ORAL
  Filled 2014-04-12 (×8): qty 1

## 2014-04-12 MED ORDER — LEVOTHYROXINE SODIUM 25 MCG PO TABS
137.0000 ug | ORAL_TABLET | Freq: Every day | ORAL | Status: DC
  Administered 2014-04-13 – 2014-04-19 (×7): 137 ug via ORAL
  Filled 2014-04-12 (×12): qty 1

## 2014-04-12 MED ORDER — DOFETILIDE 250 MCG PO CAPS
375.0000 ug | ORAL_CAPSULE | Freq: Two times a day (BID) | ORAL | Status: DC
  Administered 2014-04-12 – 2014-04-16 (×9): 375 ug via ORAL
  Filled 2014-04-12 (×10): qty 1

## 2014-04-12 MED ORDER — PANTOPRAZOLE SODIUM 40 MG PO TBEC
40.0000 mg | DELAYED_RELEASE_TABLET | Freq: Every day | ORAL | Status: DC
  Administered 2014-04-13 – 2014-04-19 (×7): 40 mg via ORAL
  Filled 2014-04-12 (×7): qty 1

## 2014-04-12 MED ORDER — METFORMIN HCL ER 500 MG PO TB24
500.0000 mg | ORAL_TABLET | Freq: Two times a day (BID) | ORAL | Status: DC
  Filled 2014-04-12 (×3): qty 1

## 2014-04-12 MED ORDER — SODIUM CHLORIDE 0.9 % IV SOLN
INTRAVENOUS | Status: DC
  Administered 2014-04-12: 18:00:00 via INTRAVENOUS

## 2014-04-12 MED ORDER — ADULT MULTIVITAMIN W/MINERALS CH
1.0000 | ORAL_TABLET | Freq: Every day | ORAL | Status: DC
  Administered 2014-04-12 – 2014-04-16 (×5): 1 via ORAL
  Filled 2014-04-12 (×7): qty 1

## 2014-04-12 MED ORDER — ACETAMINOPHEN 325 MG PO TABS
650.0000 mg | ORAL_TABLET | Freq: Once | ORAL | Status: AC
  Administered 2014-04-12: 650 mg via ORAL

## 2014-04-12 MED ORDER — SODIUM CHLORIDE 0.9 % IV SOLN
INTRAVENOUS | Status: DC
  Administered 2014-04-12: 23:00:00 via INTRAVENOUS
  Administered 2014-04-13: 1000 mL via INTRAVENOUS

## 2014-04-12 MED ORDER — SODIUM CHLORIDE 0.9 % IJ SOLN
3.0000 mL | Freq: Two times a day (BID) | INTRAMUSCULAR | Status: DC
  Administered 2014-04-12 – 2014-04-18 (×12): 3 mL via INTRAVENOUS

## 2014-04-12 MED ORDER — PROMETHAZINE HCL 25 MG PO TABS: 25.0000 mg | ORAL_TABLET | Freq: Four times a day (QID) | ORAL | Status: DC | PRN

## 2014-04-12 MED ORDER — MAGNESIUM OXIDE 400 (241.3 MG) MG PO TABS
400.0000 mg | ORAL_TABLET | Freq: Three times a day (TID) | ORAL | Status: DC
  Administered 2014-04-13 – 2014-04-19 (×26): 400 mg via ORAL
  Filled 2014-04-12 (×27): qty 1

## 2014-04-12 MED ORDER — ACETAMINOPHEN 650 MG RE SUPP: 650.0000 mg | Freq: Four times a day (QID) | RECTAL | Status: DC | PRN

## 2014-04-12 NOTE — ED Notes (Signed)
Attempted to call report

## 2014-04-12 NOTE — ED Notes (Signed)
Pt reports dizziness and SOB for the last week. Seen by CHF clinic yesterday and they "drained a bunch of fluid". Reports generalized weakness, nausea, feeling faint.

## 2014-04-12 NOTE — ED Notes (Signed)
Attempted report 

## 2014-04-12 NOTE — Consult Note (Signed)
Primary Physician: Primary Cardiologist: [name], bensimhon   HPI: [name] is a 74 yo with HTN, DM , PAF (on Tikosyn and Xarelto), chronic systolic/diastolic CHF (LVEF 45 to [id number] in 12/14)  nonobstructive CAD (Cath 03/21/14:  RA1- RV46/14, PA 43/9; PCWP 17.  LAD 40to 50% mid; LX 50% mid; RCA 40% ostial; LVEF 50 to 55%)  Seen in CHF clinic yestrerday   Noted ongoing wt gain despite increased diuretic.  Increased SOB.  Increased edema.  Eating ice all day.   She was given IV lasix 80 mg x 1 in clinic  Lasix at home was d/c'd and patient put on demedex 40 AM; 20 PM  DIet education given but patient did not appear too interested.   Daughter called this AM  Still feeling bad. Weak  Coughting  Nonproductive  Chest pressure is worse with coughing.  Still weak  Did not feel much better after given IV lasix in clinic  Called clinic   Told to come to ER>   Patient also noted f/c  Did not take temp  Currently not much better since arrival  CP with cough         Past Medical History  Diagnosis Date  . Hypertensive heart disease   . Hyperlipidemia   . Hypothyroidism   . Obesity (BMI 30-39.9)   . GERD (gastroesophageal reflux disease)   . Degenerative disc disease, cervical   . Asthma   . Vitamin D deficiency   . Depression   . Bell's palsy   . Gastroparesis   . Internal hemorrhoid   . Hiatal hernia   . Anxiety   . Osteoarthritis   . Adenomatous colon polyp 02/13/09  . Chronic headaches   . Diverticulosis   . Status post dilation of esophageal narrowing   . IBS (irritable bowel syndrome)   . PAF (paroxysmal atrial fibrillation)     a. chronic xarelto and t  . Diabetes mellitus without complication   . Diverticulitis   . Syncope     a. 12/2012: MDT Reveal LINQ ILR placed;  b. 12/2012 Echo: EF 45-50%, Gr 3 DD, mild MR, mildly dil LA;  c. 12/2012 Carotid U/S: 1-39% bilat ICA stenosis.  Marland Kitchen CAD (coronary artery disease)     a. 12/2012 Nonobstructive by cath: LM nl, LAD 50p/m, LCX 50-76m  (FFR 0.93), RCA min irregs, EF 55-65%-->Med Rx.  Marland Kitchen Neuropathy   . Atrial flutter     a. By ILR interrogation.  . SVT (supraventricular tachycardia)     a. By ILR interrogation.  Marland Kitchen NICM (nonischemic cardiomyopathy)     a. EF 45% with grade 3 diastolic dysfunction by echo 12/2012.  Marland Kitchen Complication of anesthesia   . PONV (postoperative nausea and vomiting)   . CHF (congestive heart failure)      (Not in a hospital admission)      Infusions: . sodium chloride 75 mL/hr at 04/12/14 1744    Allergies  Allergen Reactions  . Adhesive [Tape] Itching, Swelling, Rash and Other (See Comments)    Tears skin and causes blisters also  . Blueberry Flavor Anaphylaxis  . Dicyclomine Nausea And Vomiting and Other (See Comments)    "Heart trouble"; Headaches and increased blood sugars  . Food Anaphylaxis and Other (See Comments)    Melons, Bananas, Cantaloupes, Watermelon-throat closes up and blisters   . Imdur [Isosorbide] Hives, Palpitations and Other (See Comments)    Headaches also  . Januvia [Sitagliptin] Shortness Of Breath  . Lipitor [Atorvastatin] Shortness  Of Breath  . Losartan Potassium Shortness Of Breath  . Nitroglycerin Other (See Comments)    Caused cardiac arrest  . Penicillins Anaphylaxis  . Prednisone Anaphylaxis  . Vancomycin Anaphylaxis  . Cetacaine [Butamben-Tetracaine-Benzocaine] Nausea And Vomiting and Swelling  . Diltiazem Nausea Only    Chest pain also  . Hydrocodone Hives  . Oxycodone Hives  . Avelox [Moxifloxacin] Swelling and Rash  . Cefprozil Other (See Comments)    REACTION: unknown reaction    History   Social History  . Marital Status: Widowed    Spouse Name: N/A  . Number of Children: 2  . Years of Education: N/A   Occupational History  . Retired    Social History Main Topics  . Smoking status: Never Smoker   . Smokeless tobacco: Never Used  . Alcohol Use: No  . Drug Use: No  . Sexual Activity: No   Other Topics Concern  . Not on file     Social History Narrative   ** Merged History Encounter **       Divorced   3 children, 1 deceased    Family History  Problem Relation Age of Onset  . Heart attack Mother   . Diabetes Mother   . Colon cancer Father   . Esophageal cancer Father   . Kidney cancer Father   . Diabetes Father   . Ovarian cancer Sister   . Liver cancer Sister   . Breast cancer Sister   . Colon cancer Son   . Colon polyps Son   . Diabetes Sister   . Irritable bowel syndrome Sister   . Myocarditis Brother   . Rectal cancer Neg Hx   . Stomach cancer Neg Hx     REVIEW OF SYSTEMS:  All systems reviewed  Negative to the above problem except as noted above.    PHYSICAL EXAM: Filed Vitals:   04/12/14 1830  BP: 109/52  Pulse: 65  Temp:   Resp: 15     Intake/Output Summary (Last 24 hours) at 04/12/14 1845 Last data filed at 04/12/14 1735  Gross per 24 hour  Intake      0 ml  Output    100 ml  Net   -100 ml    General:  Well appearing. No respiratory difficulty HEENT: normal Neck: supple  Difficult to assess JVP  Neck full Carotids 2+ bilat; no bruits. No lymphadenopathy or thryomegaly appreciated. Cor: PMI nondisplaced. Regular rate & rhythm. No rubs, gallops or murmurs. Lungs: Rales at bases   Chest  Tender to palpation  (brings on pain she has been having) Abdomenmild diffuse tenderness  . No hepatosplenomegaly. No bruits or masses. Good bowel sounds. Extremities: no cyanosis, clubbing, rash,   1+edema Neuro: alert & oriented x 3, cranial nerves grossly intact. moves all 4 extremities w/o difficulty. Affect pleasant.  ECG:  SR 69 bpm    Results for orders placed or performed during the hospital encounter of 04/12/14 (from the past 24 hour(s))  CBC     Status: Abnormal   Collection Time: 04/12/14  2:07 PM  Result Value Ref Range   WBC 5.3 4.0 - 10.5 K/uL   RBC 4.31 3.87 - 5.11 MIL/uL   Hemoglobin 9.9 (L) 12.0 - 15.0 g/dL   HCT 34.3 (L) 36.0 - 46.0 %   MCV 79.6 78.0 - 100.0 fL    MCH 23.0 (L) 26.0 - 34.0 pg   MCHC 28.9 (L) 30.0 - 36.0 g/dL   RDW 17.6 (H)  11.5 - 15.5 %   Platelets 195 150 - 400 K/uL  Basic metabolic panel     Status: Abnormal   Collection Time: 04/12/14  2:07 PM  Result Value Ref Range   Sodium 137 135 - 145 mmol/L   Potassium 4.0 3.5 - 5.1 mmol/L   Chloride 93 (L) 96 - 112 mmol/L   CO2 34 (H) 19 - 32 mmol/L   Glucose, Bld 110 (H) 70 - 99 mg/dL   BUN 21 6 - 23 mg/dL   Creatinine, Ser 1.24 (H) 0.50 - 1.10 mg/dL   Calcium 9.7 8.4 - 10.5 mg/dL   GFR calc non Af Amer 43 (L) >90 mL/min   GFR calc Af Amer 49 (L) >90 mL/min   Anion gap 10 5 - 15  BNP (order ONLY if patient complains of dyspnea/SOB AND you have documented it for THIS visit)     Status: Abnormal   Collection Time: 04/12/14  2:07 PM  Result Value Ref Range   B Natriuretic Peptide 447.7 (H) 0.0 - 100.0 pg/mL  Troponin I ([facility])     Status: Abnormal   Collection Time: 04/12/14  2:07 PM  Result Value Ref Range   Troponin I 0.07 (H) <0.031 ng/mL  I-stat troponin, ED (not at [facility])     Status: None   Collection Time: 04/12/14  2:23 PM  Result Value Ref Range   Troponin i, poc 0.05 0.00 - 0.08 ng/mL   Comment 3          I-Stat CG4 Lactic Acid, ED     Status: None   Collection Time: 04/12/14  2:25 PM  Result Value Ref Range   Lactic Acid, Venous 1.91 0.5 - 2.0 mmol/L  I-Stat CG4 Lactic Acid, ED     Status: None   Collection Time: 04/12/14  5:20 PM  Result Value Ref Range   Lactic Acid, Venous 1.17 0.5 - 2.0 mmol/L   Dg Chest 2 View  04/12/2014   CLINICAL DATA:  Short of breath.  Dizziness, chest pain, fever  EXAM: CHEST  2 VIEW  COMPARISON:  03/19/2014  FINDINGS: Cardiac enlargement. Cardiac loop recorder noted. Negative for heart failure. No edema or effusion. Negative for pneumonia or mass lesion. Minimal scarring in the lingula is unchanged.  IMPRESSION: No active cardiopulmonary disease.   Electronically Signed   By: Franchot Gallo M.D.   On: 04/12/2014 15:26     ASSESSMENT: 74 yo  with history of mild CADby recent cath  LVEF 45 to 50%  Seen yesterday with volume overload in clinic  Still not feeling better  Weak  SOB  Now with fever to 102 On exam has some mild volume increase I would continue IV lasix to diurese  Follow renal function.  Strict I/O Patient needs to have dietary education  Was eating lots of ice prior to yesterday  Did not think it counted as a fluid  I think her chest discomfort may be some from fluid overload, but there is a musculoskel component to it as it is worse with inspiration.  2  CAD  Mild by recent cath  3. Hx atrial fib  Keep on current medicines  (Tikosyn, Xarelto)  4  Fever  patinet admitted to IM service  5.  HL  Continue statin    6  Syncope  Has LINQin place    Note:  Daughter is very upset. Tearful yet mad   Wants to know what is causing all this to keep coming  back    Says on recent admission she heard different things from different doctors.  Wants things written down  One docter she said told them ice was OK   Also asks that carotids be evaluated  I have reviewed carotid USN from 12/2012 Mild plaquing  This can be followed as outpatint.

## 2014-04-12 NOTE — Telephone Encounter (Signed)
Pt's daughter called concerned about pt, she states pt is very dizzy and feels worn out today, pt was given IV Lasix at OV yesterday.  She states pt's wt was down this AM but she is not sure how much and they are unable to check BP at home.  Discussed w/Amy Clegg, NP, she recommends pt hold Torsemide today and keep f/u appt as sch for tomorrow at 9:20.  Daughter is aware and agreeable, she states pt has not taken med yet this AM so she will have her hold it and pt will be at appt in AM.

## 2014-04-12 NOTE — Telephone Encounter (Signed)
Pt's daughter called back and states pt has taken Torsemide already and her wt today was 222 lb, she is unsure what wt was at home yesterday, at our office it was 226 lb.  She states pt feels very bad and states "her head belongs to someone else" and that now pt is having chest pressure, she is very concerned about pt.  Discussed w/Amy Ninfa Meeker, NP she states if pt is feeling that bad should report to ER for eval, daughter agreeable and will take pt to closest ER

## 2014-04-12 NOTE — ED Notes (Signed)
Dr. Elise Benne states she does not need airborne precautions; it was a linked order.
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
## 2014-04-12 NOTE — ED Notes (Signed)
MD Dr. Harrington Challenger at bedside.

## 2014-04-12 NOTE — ED Provider Notes (Signed)
Medical screening examination/treatment/procedure(s) were conducted as a shared visit with non-physician practitioner(s) and myself.  I personally evaluated the patient during the encounter.    Per pt, c/o gradual onset and worsening of persistent generalized weakness/fatigue for the past 4 days. Has been associated with subjective home fevers/chills, as well as cough. Pt states she was evaluated yesterday by her Cards MD "and they told me I was full of fluid." States she "had an IV started and they gave me medicine to get the fluid off of me." States since that time, she has felt progressive worsening of her generalized weakness, as well as lightheadedness and feeling like she "couldn't stand up today."  Denies SOB/CP, no abd pain, no N/V/D, no back pain, no rash, no focal motor weakness, no tingling/numbness in extremities. SBP 89 on arrival to the ED, +febrile (102.3). A&O, NAD, neuro non-focal, RRR, lungs coarse bilat, tr pedal edema bilat. BNP as well as BUN/Cr elevated from previous. Troponin mildly elevated, but pt denies CP; will repeat. Judicious IVF given for hypotension with SBP increasing to 104. APAP given for fever with improvement. Influenza panel pending. Will admit.  1805:  T/C to Cards Dr. [name], case discussed, including:  HPI, pertinent PM/SHx, VS/PE, dx testing, ED course and treatment:  Agreeable to consult, requests to admit to medicine service. 1830: T/C to [facility]. Humphrey Rolls, case discussed, including:  HPI, pertinent PM/SHx, VS/PE, dx testing, ED course and treatment:  agreeable to admit, requests he will come to the ED for evaluation for admission.     17:22 Orthostatic Vital Signs HS  Orthostatic Lying  - BP- Lying: 101/56 mmHg ; Pulse- Lying: 68  Orthostatic Sitting - BP- Sitting: 102/53 mmHg ; Pulse- Sitting: 72  Orthostatic Standing at 0 minutes - BP- Standing at 0 minutes: 104/43 mmHg ; Pulse- Standing at 0 minutes: 71       Filed Vitals:   04/12/14 1604 04/12/14 1625  04/12/14 1705 04/12/14 1746  BP: 89/49 96/56 94/58  104/43  Pulse: 62  65 63  Temp: 98.4 F (36.9 C)     TempSrc: Oral     Resp: 16 18 24 24   SpO2: 94% 95% 100% 99%    Results for orders placed or performed during the hospital encounter of 04/12/14  CBC  Result Value Ref Range   WBC 5.3 4.0 - 10.5 K/uL   RBC 4.31 3.87 - 5.11 MIL/uL   Hemoglobin 9.9 (L) 12.0 - 15.0 g/dL   HCT 34.3 (L) 36.0 - 46.0 %   MCV 79.6 78.0 - 100.0 fL   MCH 23.0 (L) 26.0 - 34.0 pg   MCHC 28.9 (L) 30.0 - 36.0 g/dL   RDW 17.6 (H) 11.5 - 15.5 %   Platelets 195 150 - 400 K/uL  Basic metabolic panel  Result Value Ref Range   Sodium 137 135 - 145 mmol/L   Potassium 4.0 3.5 - 5.1 mmol/L   Chloride 93 (L) 96 - 112 mmol/L   CO2 34 (H) 19 - 32 mmol/L   Glucose, Bld 110 (H) 70 - 99 mg/dL   BUN 21 6 - 23 mg/dL   Creatinine, Ser 1.24 (H) 0.50 - 1.10 mg/dL   Calcium 9.7 8.4 - 10.5 mg/dL   GFR calc non Af Amer 43 (L) >90 mL/min   GFR calc Af Amer 49 (L) >90 mL/min   Anion gap 10 5 - 15  BNP (order ONLY if patient complains of dyspnea/SOB AND you have documented it for THIS visit)  Result Value Ref Range   B Natriuretic Peptide 447.7 (H) 0.0 - 100.0 pg/mL  Troponin I (MHP)  Result Value Ref Range   Troponin I 0.07 (H) <0.031 ng/mL  Urinalysis, Routine w reflex microscopic  Result Value Ref Range   Color, Urine YELLOW YELLOW   APPearance CLEAR CLEAR   Specific Gravity, Urine 1.014 1.005 - 1.030   pH 5.0 5.0 - 8.0   Glucose, UA NEGATIVE NEGATIVE mg/dL   Hgb urine dipstick NEGATIVE NEGATIVE   Bilirubin Urine NEGATIVE NEGATIVE   Ketones, ur NEGATIVE NEGATIVE mg/dL   Protein, ur 30 (A) NEGATIVE mg/dL   Urobilinogen, UA 0.2 0.0 - 1.0 mg/dL   Nitrite NEGATIVE NEGATIVE   Leukocytes, UA NEGATIVE NEGATIVE  Urine microscopic-add on  Result Value Ref Range   Squamous Epithelial / LPF RARE RARE   WBC, UA 0-2 <3 WBC/hpf   RBC / HPF 0-2 <3 RBC/hpf   Bacteria, UA RARE RARE  I-stat troponin, ED (not at [facility])  Result  Value Ref Range   Troponin i, poc 0.05 0.00 - 0.08 ng/mL   Comment 3          I-Stat CG4 Lactic Acid, ED  Result Value Ref Range   Lactic Acid, Venous 1.91 0.5 - 2.0 mmol/L  I-Stat CG4 Lactic Acid, ED  Result Value Ref Range   Lactic Acid, Venous 1.17 0.5 - 2.0 mmol/L   Dg Chest 2 View 04/12/2014   CLINICAL DATA:  Short of breath.  Dizziness, chest pain, fever  EXAM: CHEST  2 VIEW  COMPARISON:  03/19/2014  FINDINGS: Cardiac enlargement. Cardiac loop recorder noted. Negative for heart failure. No edema or effusion. Negative for pneumonia or mass lesion. Minimal scarring in the lingula is unchanged.  IMPRESSION: No active cardiopulmonary disease.   Electronically Signed   By: Franchot Gallo M.D.   On: 04/12/2014 15:26      EKG Interpretation   Date/Time:  Thursday April 12 2014 13:52:36 EDT Ventricular Rate:  69 PR Interval:  180 QRS Duration: 66 QT Interval:  468 QTC Calculation: 501 R Axis:   54 Text Interpretation:  Normal sinus rhythm Nonspecific T wave abnormality  Anterior leads Artifact When compared with ECG of 03/22/2014 Nonspecific ST  abnormality Anterior leads is now Present Confirmed by [facility]  MD,  Nunzio Cory [phone]) on 04/12/2014 5:54:47 PM        Francine Graven, DO 04/14/14 [id number]

## 2014-04-12 NOTE — ED Notes (Signed)
Pt knows that urine is needed

## 2014-04-12 NOTE — Telephone Encounter (Signed)
error 
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
## 2014-04-13 ENCOUNTER — Inpatient Hospital Stay (HOSPITAL_COMMUNITY): Admission: RE | Admit: 2014-04-13 | Payer: Medicare HMO | Source: Ambulatory Visit

## 2014-04-13 ENCOUNTER — Other Ambulatory Visit: Payer: Self-pay

## 2014-04-13 DIAGNOSIS — I480 Paroxysmal atrial fibrillation: Secondary | ICD-10-CM | POA: Diagnosis not present

## 2014-04-13 DIAGNOSIS — Z5181 Encounter for therapeutic drug level monitoring: Secondary | ICD-10-CM

## 2014-04-13 DIAGNOSIS — N289 Disorder of kidney and ureter, unspecified: Secondary | ICD-10-CM | POA: Insufficient documentation

## 2014-04-13 DIAGNOSIS — Z79899 Other long term (current) drug therapy: Secondary | ICD-10-CM

## 2014-04-13 DIAGNOSIS — R509 Fever, unspecified: Secondary | ICD-10-CM | POA: Insufficient documentation

## 2014-04-13 DIAGNOSIS — I5032 Chronic diastolic (congestive) heart failure: Secondary | ICD-10-CM | POA: Diagnosis not present

## 2014-04-13 DIAGNOSIS — E039 Hypothyroidism, unspecified: Secondary | ICD-10-CM | POA: Diagnosis not present

## 2014-04-13 DIAGNOSIS — I959 Hypotension, unspecified: Secondary | ICD-10-CM | POA: Diagnosis not present

## 2014-04-13 DIAGNOSIS — N179 Acute kidney failure, unspecified: Secondary | ICD-10-CM

## 2014-04-13 LAB — CBC
HCT: 32.1 % — ABNORMAL LOW (ref 36.0–46.0)
Hemoglobin: 9.2 g/dL — ABNORMAL LOW (ref 12.0–15.0)
MCH: 22.8 pg — ABNORMAL LOW (ref 26.0–34.0)
MCHC: 28.7 g/dL — ABNORMAL LOW (ref 30.0–36.0)
MCV: 79.7 fL (ref 78.0–100.0)
Platelets: 199 10*3/uL (ref 150–400)
RBC: 4.03 MIL/uL (ref 3.87–5.11)
RDW: 17.7 % — AB (ref 11.5–15.5)
WBC: 5.5 10*3/uL (ref 4.0–10.5)

## 2014-04-13 LAB — TROPONIN I
Troponin I: 0.05 ng/mL — ABNORMAL HIGH (ref <0.031)
Troponin I: 0.04 ng/mL — ABNORMAL HIGH (ref <0.031)

## 2014-04-13 LAB — COMPREHENSIVE METABOLIC PANEL
ALT: 23 U/L (ref 0–35)
AST: 85 U/L — AB (ref 0–37)
Albumin: 3.5 g/dL (ref 3.5–5.2)
Alkaline Phosphatase: 45 U/L (ref 39–117)
Anion gap: 9 (ref 5–15)
BUN: 22 mg/dL (ref 6–23)
CALCIUM: 9.2 mg/dL (ref 8.4–10.5)
CO2: 34 mmol/L — ABNORMAL HIGH (ref 19–32)
Chloride: 97 mmol/L (ref 96–112)
Creatinine, Ser: 1.2 mg/dL — ABNORMAL HIGH (ref 0.50–1.10)
GFR calc Af Amer: 51 mL/min — ABNORMAL LOW (ref >90)
GFR calc non Af Amer: 44 mL/min — ABNORMAL LOW (ref >90)
Glucose, Bld: 48 mg/dL — ABNORMAL LOW (ref 70–99)
Potassium: 3.4 mmol/L — ABNORMAL LOW (ref 3.5–5.1)
SODIUM: 140 mmol/L (ref 135–145)
TOTAL PROTEIN: 6.6 g/dL (ref 6.0–8.3)
Total Bilirubin: 0.6 mg/dL (ref 0.3–1.2)

## 2014-04-13 LAB — GLUCOSE, CAPILLARY
Glucose-Capillary: 58 mg/dL — ABNORMAL LOW (ref 70–99)
Glucose-Capillary: 95 mg/dL (ref 70–99)
Glucose-Capillary: 89 mg/dL (ref 70–99)

## 2014-04-13 LAB — TSH: TSH: 1.591 u[IU]/mL (ref 0.350–4.500)

## 2014-04-13 LAB — INFLUENZA PANEL BY PCR (TYPE A & B)
H1N1FLUPCR: NOT DETECTED
INFLAPCR: NEGATIVE
INFLBPCR: POSITIVE — AB

## 2014-04-13 MED ORDER — CETYLPYRIDINIUM CHLORIDE 0.05 % MT LIQD
7.0000 mL | Freq: Two times a day (BID) | OROMUCOSAL | Status: DC
  Administered 2014-04-13 – 2014-04-19 (×11): 7 mL via OROMUCOSAL

## 2014-04-13 MED ORDER — LEVALBUTEROL HCL 1.25 MG/0.5ML IN NEBU
1.2500 mg | INHALATION_SOLUTION | Freq: Once | RESPIRATORY_TRACT | Status: AC
  Administered 2014-04-13: 1.25 mg via RESPIRATORY_TRACT
  Filled 2014-04-13: qty 0.5

## 2014-04-13 MED ORDER — GLIPIZIDE ER 10 MG PO TB24
10.0000 mg | ORAL_TABLET | Freq: Every day | ORAL | Status: DC
  Filled 2014-04-13 (×2): qty 1

## 2014-04-13 MED ORDER — OSELTAMIVIR PHOSPHATE 30 MG PO CAPS
30.0000 mg | ORAL_CAPSULE | Freq: Two times a day (BID) | ORAL | Status: DC
Start: 2014-04-13 — End: 2014-04-14
  Administered 2014-04-13 – 2014-04-14 (×3): 30 mg via ORAL
  Filled 2014-04-13 (×4): qty 1

## 2014-04-13 MED ORDER — OSELTAMIVIR PHOSPHATE 75 MG PO CAPS: 75.0000 mg | ORAL_CAPSULE | Freq: Two times a day (BID) | ORAL | Status: DC

## 2014-04-13 MED ORDER — POTASSIUM CHLORIDE CRYS ER 20 MEQ PO TBCR
20.0000 meq | EXTENDED_RELEASE_TABLET | Freq: Once | ORAL | Status: AC
  Administered 2014-04-13: 20 meq via ORAL
  Filled 2014-04-13: qty 1

## 2014-04-13 MED ORDER — INSULIN GLARGINE 100 UNIT/ML ~~LOC~~ SOLN
20.0000 [IU] | Freq: Two times a day (BID) | SUBCUTANEOUS | Status: DC
  Filled 2014-04-13: qty 0.2

## 2014-04-13 NOTE — Progress Notes (Signed)
I cosign all documentation and medication administration for this shift by Komicia S Jefferies, Student RN. 

## 2014-04-13 NOTE — Progress Notes (Addendum)
[facility] PROGRESS NOTE  [name] [id number] DOB: 06-14-1940 DOA: 04/12/2014 PCP: Octavio Graves, DO Interim summary: 74 year old lady admitted for fever, myalgia,s and generalized weakness.   Assessment/Plan: 1. Generalized weakness probably from hypotension from hypovolemia: Admitted to telemetry and holding all diuretics. Gentle hydration with IV F at 78ml/hr. Monitor BP parameters, get orthostatic vital signs.   Elevated troponin's: Probably from demand ischemia from hypotension. She denies any chest pain. EKG reviewed, normal sinus rhythm with t wave abnormalities.    Diabetes mellitus: CBG (last 3)   Recent Labs  04/13/14 0615 04/13/14 0658 04/13/14 1219  GLUCAP 58* 95 89   Her cbg are running low. Stop glipizide and metformin and decrease the dose of lantus to 20 BID till she starts eating her full tray of meals.  Her last hgba1c is 8 in 2/16.  Resume SSI.   Chronic diastolic heart failure: Appears euvolemic.  Holding diuretics for hypotension.  Further management as per cadiology.    Fever of 102: From influenza B.  BLood cultures not done on admission.  UA does not show any infection.  CXR does not show any pneumonia.  z pack for bronchitis.    Hypothyroidism: Resume synthroid.   Chronic atrial fibrillation: Rate controlled and on xarelto and tikosyn. Watch potassium and magnesium levels tomorrow.      Anemia; Normocytic. Anemia panel will be sent.  Stool for occult blood ordered.  Monitor.    Hypokalemia: replete as needed and recheck in am.  Check magnesium level in am.     Code Status: full code.  Family Communication: none atbedside Disposition Plan: pending PT eval to morrow.    Consultants:  cardiology  Procedures:  none  Antibiotics: z pack HPI/Subjective: Denies any new complaints, feels better than yesterday.   Objective: Filed Vitals:   04/13/14 0500  BP: 112/48  Pulse: 81  Temp: 99.6 F (37.6  C)  Resp: 18    Intake/Output Summary (Last 24 hours) at 04/13/14 1426 Last data filed at 04/13/14 1336  Gross per 24 hour  Intake    720 ml  Output    550 ml  Net    170 ml   Filed Weights   04/12/14 1951 04/12/14 2038 04/13/14 0504  Weight: 99.066 kg (218 lb 6.4 oz) 98.748 kg (217 lb 11.2 oz) 99.428 kg (219 lb 3.2 oz)    Exam:   General:  Alert afebrile comfortable on oxygen,  Cardiovascular: s1s2, no mrg  Respiratory: scattered wheezing, no rhonchi, air entry fair.   Abdomen: soft non tender non distended bowel sounds heard  Musculoskeletal: trace edema.   Data Reviewed: Basic Metabolic Panel:  Recent Labs Lab 04/11/14 1244 04/12/14 1407 04/13/14 0315  NA 137 137 140  K 3.8 4.0 3.4*  CL 98 93* 97  CO2 28 34* 34*  GLUCOSE 226* 110* 48*  BUN 18 21 22   CREATININE 1.05 1.24* 1.20*  CALCIUM 9.5 9.7 9.2   Liver Function Tests:  Recent Labs Lab 04/13/14 0315  AST 85*  ALT 23  ALKPHOS 45  BILITOT 0.6  PROT 6.6  ALBUMIN 3.5   No results for input(s): LIPASE, AMYLASE in the last 168 hours. No results for input(s): AMMONIA in the last 168 hours. CBC:  Recent Labs Lab 04/12/14 1407 04/13/14 0315  WBC 5.3 5.5  HGB 9.9* 9.2*  HCT 34.3* 32.1*  MCV 79.6 79.7  PLT 195 199   Cardiac Enzymes:  Recent Labs Lab 04/12/14 1407 04/12/14 1715  04/12/14 2250 04/13/14 0315 04/13/14 0908  TROPONINI 0.07* 0.07* 0.06* 0.05* 0.04*   BNP (last 3 results)  Recent Labs  03/19/14 1537 04/11/14 1245 04/12/14 1407  BNP 87.3  85.7 422.0* 447.7*    ProBNP (last 3 results)  Recent Labs  08/01/13 1940 08/03/13 1058 10/01/13 1355  PROBNP 607.1* 256.8* 169.5*    CBG:  Recent Labs Lab 04/12/14 2141 04/12/14 2230 04/13/14 0615 04/13/14 0658 04/13/14 1219  GLUCAP 57* 77 58* 95 89    No results found for this or any previous visit (from the past 240 hour(s)).   Studies: Dg Chest 2 View  04/12/2014   CLINICAL DATA:  Short of breath.   Dizziness, chest pain, fever  EXAM: CHEST  2 VIEW  COMPARISON:  03/19/2014  FINDINGS: Cardiac enlargement. Cardiac loop recorder noted. Negative for heart failure. No edema or effusion. Negative for pneumonia or mass lesion. Minimal scarring in the lingula is unchanged.  IMPRESSION: No active cardiopulmonary disease.   Electronically Signed   By: Franchot Gallo M.D.   On: 04/12/2014 15:26    Scheduled Meds: . antiseptic oral rinse  7 mL Mouth Rinse BID  . [START ON 04/14/2014] azithromycin  250 mg Oral Daily  . cholecalciferol  5,000 Units Oral Daily  . docusate sodium  100 mg Oral BID  . dofetilide  375 mcg Oral BID  . estradiol  1 mg Oral Daily  . folic acid  1 mg Oral Daily  . glipiZIDE  10 mg Oral Q breakfast  . insulin glargine  35 Units Subcutaneous BID  . levothyroxine  137 mcg Oral QAC breakfast  . magnesium oxide  400 mg Oral TID AC & HS  . metFORMIN  500 mg Oral BID  . multivitamin with minerals  1 tablet Oral Daily  . pantoprazole  40 mg Oral Daily  . rivaroxaban  20 mg Oral QAC supper  . simvastatin  40 mg Oral QPM  . sodium chloride  3 mL Intravenous Q12H  . thiamine  100 mg Oral Daily   Continuous Infusions: . sodium chloride 1,000 mL (04/13/14 1057)    Active Problems:   Hypothyroidism   Hypotension   Hyperlipidemia   Chronic diastolic CHF (congestive heart failure)    Time spent: 25 minutes    [facility] Pager [phone]. If 7PM-7AM, please contact night-coverage at [email or web address], password [facility] 04/13/2014, 2:26 PM

## 2014-04-13 NOTE — Progress Notes (Signed)
Inpatient Diabetes Program Recommendations  AACE/ADA: New Consensus Statement on Inpatient Glycemic Control (2013)  Target Ranges:  Prepandial:   less than 140 mg/dL      Peak postprandial:   less than 180 mg/dL (1-2 hours)      Critically ill patients:  140 - 180 mg/dL   Inpatient Diabetes Program Recommendations Insulin - Basal: Pt had been ordered home doseof lantus 35 units bid, however, no lantus had yet been given before the hypoglycemia events this am.. No insulin has been given nor needed thus far. Please hold the lantus altogether until glucose levels demonstrate a need. Correction (SSI): Please consider using only sensitive correction tidwc and HS scale at this time. Oral Agents: No ever given-now discontnued  Thank you Rosita Kea, RN, MSN, CDE  Diabetes Inpatient Program Office: [phone] Pager: [phone] 8:00 am to 5:00 pm

## 2014-04-13 NOTE — Progress Notes (Signed)
Pt's daughter [name] requesting to speak with Dr Karleen Hampshire and MD notified and called back to let me know that she has attempted to call her via pt's room phone and no response.  Usha Slager,RN.

## 2014-04-13 NOTE — Progress Notes (Signed)
Patient Name: [name] Date of Encounter: 04/13/2014    SUBJECTIVE: The patient was admitted to the hospital not feeling well. In the office she had a temperature greater than 101. She was admitted by the internal medicine service. She received a one-time 80 mg IV dose of Lasix in the CHF clinic 36 hours ago. She was put on Demadex at home 40/20 mg a.m./p.m.  TELEMETRY:  Normal sinus rhythm: Filed Vitals:   04/12/14 2038 04/13/14 0214 04/13/14 0500 04/13/14 0504  BP: 115/50 97/48 112/48   Pulse: 67 66 81   Temp: 98.8 F (37.1 C) 98.5 F (36.9 C) 99.6 F (37.6 C)   TempSrc: Oral Oral Oral   Resp:  18 18   Height: 5\' 2"  (1.575 m)     Weight: 217 lb 11.2 oz (98.748 kg)   219 lb 3.2 oz (99.428 kg)  SpO2: 96% 95% 97%     Intake/Output Summary (Last 24 hours) at 04/13/14 0945 Last data filed at 04/13/14 [id number]  Gross per 24 hour  Intake    480 ml  Output    550 ml  Net    -70 ml   LABS: Basic Metabolic Panel:  Recent Labs  04/12/14 1407 04/13/14 0315  NA 137 140  K 4.0 3.4*  CL 93* 97  CO2 34* 34*  GLUCOSE 110* 48*  BUN 21 22  CREATININE 1.24* 1.20*  CALCIUM 9.7 9.2   CBC:  Recent Labs  04/12/14 1407 04/13/14 0315  WBC 5.3 5.5  HGB 9.9* 9.2*  HCT 34.3* 32.1*  MCV 79.6 79.7  PLT 195 199   Cardiac Enzymes:  Recent Labs  04/12/14 1715 04/12/14 2250 04/13/14 0315  TROPONINI 0.07* 0.06* 0.05*   BNP    Component Value Date/Time   BNP 447.7* 04/12/2014 1407    ProBNP    Component Value Date/Time   PROBNP 169.5* 10/01/2013 1355     Radiology/Studies:  Admission chest x-ray did not reveal any evidence of CHF.  Physical Exam: Blood pressure 112/48, pulse 81, temperature 99.6 F (37.6 C), temperature source Oral, resp. rate 18, height 5\' 2"  (1.575 m), weight 219 lb 3.2 oz (99.428 kg), SpO2 97 %. Weight change:   Wt Readings from Last 3 Encounters:  04/13/14 219 lb 3.2 oz (99.428 kg)  04/02/14 229 lb 9.6 oz (104.146 kg)  03/22/14  215 lb 12.8 oz (97.886 kg)    Chest is clear Neck veins are flat Extremities reveal trace edema No cardiac murmur or gallop is heard  ASSESSMENT:  1. Acute kidney injury related to volume removal over the past 48 hours. Demadex recently started and furosemide discontinued. 2. Relative hypotension possibly related to recent diuresis. Blood pressure improved with overnight hydration. 3. History of paroxysmal atrial fibrillation and treated with both Xarelto and Tikosyn which are renally excreted and may require a dose adjustment. 4. Chronic diastolic heart failure, current volume status is euvolemic. There is no evidence of acute volume overload. Perhaps the patient was excessively diuresed starting on 04/11/14  Plan:  1. Check magnesium level 2. Agree with holding diuretics today. 3. Depending upon renal function, adjustments in Xarelto /Tikosyn dosing may be required 4. Tomorrow we will have to establish a chronic diuretic dose. On 3/16, CHF clinic started Demadex 40 mg/20 mg a.m./p.m. Perhaps she will only need 40 mg once per day starting tomorrow. 5. Based upon tomorrow as renal function, adjustments in Xarelto and Tikosyn may be required   Signed,  Sinclair Grooms 04/13/2014, 9:45 AM

## 2014-04-13 NOTE — Progress Notes (Signed)
Nutrition Education Note  RD consulted for nutrition education regarding low sodium diet and 1800 ml fluid restriction; CHF.  RD provided "Heart Failure Nutrition Therapy" handout from the [employer]. Patient reports following a low sodium diet PTA, NAS. Reviewed patient's dietary recall.  Reviewed guidelines for checking nutrition labels and sodium range to aim for. Encouraged fresh fruits and vegetables as well as whole grain sources of carbohydrates to maximize fiber intake.   RD discussed why it is important for patient to adhere to diet recommendations, and emphasized the role of fluids, foods to avoid, and importance of weighing self daily. Discussed thirst-quenching tips and provided tips for monitoring fluid intake. Teach back method used.  Expect good compliance.  Body mass index is 40.08 kg/(m^2). Pt meets criteria for Obesity based on current BMI.  Current diet order is Heart healthy/Carb Modified, patient is consuming approximately 100% of meals at this time. Labs and medications reviewed. No further nutrition interventions warranted at this time. RD contact information provided. If additional nutrition issues arise, please re-consult RD.   Pryor Ochoa RD, LDN Inpatient Clinical Dietitian Pager: [phone] After Hours Pager: [phone]

## 2014-04-13 NOTE — Progress Notes (Signed)
UR completed 

## 2014-04-13 NOTE — Evaluation (Signed)
Physical Therapy Evaluation Patient Details Name: [name] MRN: [id number] DOB: 1940/08/19 Today's Date: 04/13/2014   History of Present Illness  [name] is a 74 y.o. female presents with weakness due to CHF and diuresis.  Clinical Impression  Patient presents at modified independent level for mobility with walker.  She ambulated on 1L O2 with O2 sats WNL. No follow up PT needs identified.  No further skilled PT needs at this time.  Did recommend walking with nursing staff if not d/c home today.    Follow Up Recommendations No PT follow up    Equipment Recommendations  None recommended by PT    Recommendations for Other Services       Precautions / Restrictions Precautions Precautions: Fall      Mobility  Bed Mobility Overal bed mobility: Modified Independent             General bed mobility comments: pulled up on rail with head of bed elevated  Transfers Overall transfer level: Modified independent Equipment used: Rolling walker (2 wheeled)                Ambulation/Gait Ambulation/Gait assistance: Modified independent (Device/Increase time) Ambulation Distance (Feet): 150 Feet Assistive device: Rolling walker (2 wheeled) Gait Pattern/deviations: Step-through pattern;Decreased stride length Gait velocity: very slow   General Gait Details: slow pace, increased time for turns, checked SpO2 in hall with 1L O2 94%  Stairs            Wheelchair Mobility    Modified Rankin (Stroke Patients Only)       Balance Overall balance assessment: Needs assistance         Standing balance support: No upper extremity supported Standing balance-Leahy Scale: Fair Standing balance comment: static standing without UE support, but feels more secure with walker for ambulation due to LE weakness                             Pertinent Vitals/Pain Pain Assessment: 0-10 Pain Score: 8  Pain Location: back pain & HA Pain Descriptors /  Indicators: Aching Pain Intervention(s): Monitored during session;Repositioned    Home Living Family/patient expects to be discharged to:: Private residence Living Arrangements: Children Available Help at Discharge: Family;Available 24 hours/day Type of Home: House Home Access: Ramped entrance     Home Layout: One level Home Equipment: Walker - 2 wheels;Cane - quad;Cane - single point;Grab bars - tub/shower Additional Comments: walk in shower    Prior Function Level of Independence: Independent         Comments: states walks slow, but has fallen over cane and walker     Hand Dominance        Extremity/Trunk Assessment               Lower Extremity Assessment: Generalized weakness         Communication   Communication: No difficulties  Cognition Arousal/Alertness: Awake/alert Behavior During Therapy: WFL for tasks assessed/performed Overall Cognitive Status: Within Functional Limits for tasks assessed                      General Comments      Exercises        Assessment/Plan    PT Assessment Patent does not need any further PT services  PT Diagnosis Generalized weakness   PT Problem List    PT Treatment Interventions     PT Goals (Current goals can  be found in the Care Plan section) Acute Rehab PT Goals PT Goal Formulation: All assessment and education complete, DC therapy    Frequency     Barriers to discharge        Co-evaluation               End of Session Equipment Utilized During Treatment: Gait belt;Oxygen Activity Tolerance: Patient tolerated treatment well Patient left: in bed;with call bell/phone within reach;with family/visitor present      Functional Assessment Tool Used: Clinical Judgement Functional Limitation: Mobility: Walking and moving around Mobility: Walking and Moving Around Current Status [id number]): At least 1 percent but less than 20 percent impaired, limited or restricted Mobility: Walking and  Moving Around Goal Status [phone]): At least 1 percent but less than 20 percent impaired, limited or restricted Mobility: Walking and Moving Around Discharge Status [phone]): At least 1 percent but less than 20 percent impaired, limited or restricted    Time: 1530-1553 PT Time Calculation (min) (ACUTE ONLY): 23 min   Charges:   PT Evaluation $Initial PT Evaluation Tier I: 1 Procedure PT Treatments $Gait Training: 8-22 mins   PT G Codes:   PT G-Codes **NOT FOR INPATIENT CLASS** Functional Assessment Tool Used: Clinical Judgement Functional Limitation: Mobility: Walking and moving around Mobility: Walking and Moving Around Current Status [id number]): At least 1 percent but less than 20 percent impaired, limited or restricted Mobility: Walking and Moving Around Goal Status [phone]): At least 1 percent but less than 20 percent impaired, limited or restricted Mobility: Walking and Moving Around Discharge Status [phone]): At least 1 percent but less than 20 percent impaired, limited or restricted    [facility] 04/13/2014, 4:14 PM  [place], [place] 04/13/2014

## 2014-04-14 DIAGNOSIS — E039 Hypothyroidism, unspecified: Secondary | ICD-10-CM | POA: Diagnosis not present

## 2014-04-14 DIAGNOSIS — I959 Hypotension, unspecified: Secondary | ICD-10-CM | POA: Insufficient documentation

## 2014-04-14 DIAGNOSIS — I5032 Chronic diastolic (congestive) heart failure: Secondary | ICD-10-CM | POA: Diagnosis not present

## 2014-04-14 DIAGNOSIS — R509 Fever, unspecified: Secondary | ICD-10-CM | POA: Diagnosis not present

## 2014-04-14 LAB — IRON AND TIBC
Iron: 25 ug/dL — ABNORMAL LOW (ref 42–145)
Saturation Ratios: 6 % — ABNORMAL LOW (ref 20–55)
TIBC: 402 ug/dL (ref 250–470)
UIBC: 377 ug/dL (ref 125–400)

## 2014-04-14 LAB — URINE CULTURE
Colony Count: NO GROWTH
Culture: NO GROWTH

## 2014-04-14 LAB — BASIC METABOLIC PANEL
Anion gap: 9 (ref 5–15)
BUN: 14 mg/dL (ref 6–23)
CHLORIDE: 95 mmol/L — AB (ref 96–112)
CO2: 31 mmol/L (ref 19–32)
Calcium: 8.9 mg/dL (ref 8.4–10.5)
Creatinine, Ser: 0.96 mg/dL (ref 0.50–1.10)
GFR calc Af Amer: 67 mL/min — ABNORMAL LOW (ref >90)
GFR calc non Af Amer: 58 mL/min — ABNORMAL LOW (ref >90)
Glucose, Bld: 155 mg/dL — ABNORMAL HIGH (ref 70–99)
Potassium: 3.9 mmol/L (ref 3.5–5.1)
Sodium: 135 mmol/L (ref 135–145)

## 2014-04-14 LAB — RETICULOCYTES
RBC.: 3.99 MIL/uL (ref 3.87–5.11)
Retic Count, Absolute: 39.9 10*3/uL (ref 19.0–186.0)
Retic Ct Pct: 1 % (ref 0.4–3.1)

## 2014-04-14 LAB — GLUCOSE, CAPILLARY
GLUCOSE-CAPILLARY: 196 mg/dL — AB (ref 70–99)
Glucose-Capillary: 155 mg/dL — ABNORMAL HIGH (ref 70–99)
Glucose-Capillary: 189 mg/dL — ABNORMAL HIGH (ref 70–99)

## 2014-04-14 LAB — HEMOGLOBIN A1C
Hgb A1c MFr Bld: 7.4 % — ABNORMAL HIGH (ref 4.8–5.6)
Mean Plasma Glucose: 166 mg/dL

## 2014-04-14 LAB — MAGNESIUM: Magnesium: 1.7 mg/dL (ref 1.5–2.5)

## 2014-04-14 MED ORDER — INSULIN GLARGINE 100 UNIT/ML ~~LOC~~ SOLN
10.0000 [IU] | Freq: Every day | SUBCUTANEOUS | Status: DC
  Administered 2014-04-14: 10 [IU] via SUBCUTANEOUS
  Filled 2014-04-14 (×3): qty 0.1

## 2014-04-14 MED ORDER — IPRATROPIUM-ALBUTEROL 0.5-2.5 (3) MG/3ML IN SOLN
3.0000 mL | RESPIRATORY_TRACT | Status: DC
  Administered 2014-04-14 (×2): 3 mL via RESPIRATORY_TRACT
  Filled 2014-04-14 (×2): qty 3

## 2014-04-14 MED ORDER — DILTIAZEM HCL 30 MG PO TABS
30.0000 mg | ORAL_TABLET | Freq: Four times a day (QID) | ORAL | Status: DC
  Administered 2014-04-14 – 2014-04-16 (×7): 30 mg via ORAL
  Filled 2014-04-14 (×12): qty 1

## 2014-04-14 MED ORDER — MAGNESIUM SULFATE IN D5W 10-5 MG/ML-% IV SOLN
1.0000 g | Freq: Once | INTRAVENOUS | Status: AC
  Administered 2014-04-14: 1 g via INTRAVENOUS
  Filled 2014-04-14: qty 100

## 2014-04-14 MED ORDER — ONDANSETRON HCL 4 MG/2ML IJ SOLN
4.0000 mg | Freq: Four times a day (QID) | INTRAMUSCULAR | Status: DC | PRN
  Administered 2014-04-16: 4 mg via INTRAVENOUS
  Filled 2014-04-14: qty 2

## 2014-04-14 MED ORDER — IPRATROPIUM BROMIDE 0.02 % IN SOLN
0.5000 mg | RESPIRATORY_TRACT | Status: DC | PRN
  Administered 2014-04-18: 0.5 mg via RESPIRATORY_TRACT
  Filled 2014-04-14: qty 2.5

## 2014-04-14 MED ORDER — LEVALBUTEROL HCL 0.63 MG/3ML IN NEBU
0.6300 mg | INHALATION_SOLUTION | Freq: Four times a day (QID) | RESPIRATORY_TRACT | Status: DC | PRN
  Administered 2014-04-18: 0.63 mg via RESPIRATORY_TRACT
  Filled 2014-04-14: qty 3

## 2014-04-14 MED ORDER — TORSEMIDE 20 MG PO TABS
40.0000 mg | ORAL_TABLET | Freq: Every day | ORAL | Status: DC
  Administered 2014-04-14: 40 mg via ORAL
  Filled 2014-04-14 (×2): qty 2

## 2014-04-14 MED ORDER — DILTIAZEM HCL 100 MG IV SOLR
5.0000 mg/h | INTRAVENOUS | Status: DC
  Administered 2014-04-14: 10 mg/h via INTRAVENOUS
  Administered 2014-04-14: 5 mg/h via INTRAVENOUS
  Filled 2014-04-14 (×2): qty 100

## 2014-04-14 MED ORDER — INSULIN ASPART 100 UNIT/ML ~~LOC~~ SOLN
0.0000 [IU] | Freq: Three times a day (TID) | SUBCUTANEOUS | Status: DC
  Administered 2014-04-15: 5 [IU] via SUBCUTANEOUS
  Administered 2014-04-15 (×2): 2 [IU] via SUBCUTANEOUS
  Administered 2014-04-16: 3 [IU] via SUBCUTANEOUS
  Administered 2014-04-16: 2 [IU] via SUBCUTANEOUS
  Administered 2014-04-16: 3 [IU] via SUBCUTANEOUS
  Administered 2014-04-17 – 2014-04-18 (×4): 2 [IU] via SUBCUTANEOUS
  Administered 2014-04-18: 3 [IU] via SUBCUTANEOUS
  Administered 2014-04-18 – 2014-04-19 (×3): 2 [IU] via SUBCUTANEOUS

## 2014-04-14 MED ORDER — POTASSIUM CHLORIDE CRYS ER 20 MEQ PO TBCR
40.0000 meq | EXTENDED_RELEASE_TABLET | Freq: Once | ORAL | Status: AC
  Administered 2014-04-14: 40 meq via ORAL
  Filled 2014-04-14: qty 2

## 2014-04-14 MED ORDER — DILTIAZEM LOAD VIA INFUSION
10.0000 mg | Freq: Once | INTRAVENOUS | Status: AC
  Administered 2014-04-14: 10 mg via INTRAVENOUS
  Filled 2014-04-14: qty 10

## 2014-04-14 NOTE — Progress Notes (Signed)
[facility] PROGRESS NOTE  [name] [id number] DOB: [birth year] DOA: 04/12/2014 PCP: Octavio Graves, DO Interim summary: 74 year old lady admitted for fever, myalgia,s and generalized weakness. She was tested positive for flu , tamiflu started but patient didn't want it , as it was making her sick on her stomach.  Assessment/Plan: 1. Generalized weakness probably from hypotension from hypovolemia: Resolved with fluids and fluid were stopped as she was developing pedal edema.  Her BP parameters have improved.   Elevated troponin's: Probably from demand ischemia from hypotension. She denies any chest pain. EKG reviewed, normal sinus rhythm with t wave abnormalities.     Diabetes mellitus: CBG (last 3)   Recent Labs  04/13/14 1219 04/14/14 0635 04/14/14 1135  GLUCAP 89 155* 189*   Her CBG'S were running low yesterday and all oral meds and lantus  have been discontinued. She has her appettite back and her cbgs are back to normal. Would resume half the dose of lantus tonight and watch her cbgs' we will still continue to hold oral medications.   Acute on Chronic diastolic heart failure: Appears slightly fluid overload, with pedal edema. She was restarted back on the torsemide 40 mg daily. Please check renal function, and electrolytes while on torsemide. Daily weights and strict intake and output monitor.    Influenza B PCR positive.  BLood cultures not done on admission.  UA does not show any infection.  CXR does not show any pneumonia.  z pack for bronchitis.  Started on tamiflu but patient refused it as it was making her sick on her stomach and we have stopped it.    Hypothyroidism: Resume synthroid.   Chronic atrial fibrillation: Went in to AFIB with RVR, last night , and she was started on cardizem IV , as her rate improved, she was transitioned to po cardizem.  She is on xarelto and tikosyn.  Her potassium waas 3.9 and mag was 1.7. We have repleted both k  and mg, and plan to repeat levels in am.     Anemia; Normocytic.anemia panel shows low iron levels. And ferritin  Stool for occult blood pending.  Monitor.    Hypokalemia: replaced as needed.     Acute on chronic renal failure: She was dehydrated and hypovolemic on admission, she received gentle hydration and her creatinine appears to be at basline.    Code Status: full code.  Family Communication:daughter at bedside.  Disposition Plan: home when she has improved.    Consultants:  cardiology  Procedures:  none  Antibiotics: z pack HPI/Subjective: Reports having sob earlier this am.   Objective: Filed Vitals:   04/14/14 1509  BP: 104/45  Pulse: 61  Temp: 98.4 F (36.9 C)  Resp: 20    Intake/Output Summary (Last 24 hours) at 04/14/14 1554 Last data filed at 04/14/14 1509  Gross per 24 hour  Intake 27053.5 ml  Output   1100 ml  Net 25953.5 ml   Filed Weights   04/12/14 2038 04/13/14 0504 04/14/14 0625  Weight: 98.748 kg (217 lb 11.2 oz) 99.428 kg (219 lb 3.2 oz) 101.152 kg (223 lb)    Exam:   General:  Alert afebrile comfortable on oxygen,   Cardiovascular: s1s2, no mrg, irregular.   Respiratory: scattered wheezing, no rhonchi, air entry fair.   Abdomen: soft non tender non distended bowel sounds heard  Musculoskeletal: 1+ edema.   Data Reviewed: Basic Metabolic Panel:  Recent Labs Lab 04/11/14 1244 04/12/14 1407 04/13/14 0315 04/14/14 0355  NA 137 137 140 135  K 3.8 4.0 3.4* 3.9  CL 98 93* 97 95*  CO2 28 34* 34* 31  GLUCOSE 226* 110* 48* 155*  BUN 18 21 22 14   CREATININE 1.05 1.24* 1.20* 0.96  CALCIUM 9.5 9.7 9.2 8.9  MG  --   --   --  1.7   Liver Function Tests:  Recent Labs Lab 04/13/14 0315  AST 85*  ALT 23  ALKPHOS 45  BILITOT 0.6  PROT 6.6  ALBUMIN 3.5   No results for input(s): LIPASE, AMYLASE in the last 168 hours. No results for input(s): AMMONIA in the last 168 hours. CBC:  Recent Labs Lab 04/12/14 1407  04/13/14 0315  WBC 5.3 5.5  HGB 9.9* 9.2*  HCT 34.3* 32.1*  MCV 79.6 79.7  PLT 195 199   Cardiac Enzymes:  Recent Labs Lab 04/12/14 1407 04/12/14 1715 04/12/14 2250 04/13/14 0315 04/13/14 0908  TROPONINI 0.07* 0.07* 0.06* 0.05* 0.04*   BNP (last 3 results)  Recent Labs  03/19/14 1537 04/11/14 1245 04/12/14 1407  BNP 87.3  85.7 422.0* 447.7*    ProBNP (last 3 results)  Recent Labs  08/01/13 1940 08/03/13 1058 10/01/13 1355  PROBNP 607.1* 256.8* 169.5*    CBG:  Recent Labs Lab 04/13/14 0615 04/13/14 0658 04/13/14 1219 04/14/14 0635 04/14/14 1135  GLUCAP 58* 95 89 155* 189*    Recent Results (from the past 240 hour(s))  Urine culture     Status: None   Collection Time: 04/12/14  5:35 PM  Result Value Ref Range Status   Specimen Description URINE, CATHETERIZED  Final   Special Requests NONE  Final   Colony Count NO GROWTH Performed at [facility]   Final   Culture NO GROWTH Performed at [facility]   Final   Report Status 04/14/2014 FINAL  Final     Studies: No results found.  Scheduled Meds: . antiseptic oral rinse  7 mL Mouth Rinse BID  . azithromycin  250 mg Oral Daily  . cholecalciferol  5,000 Units Oral Daily  . diltiazem  30 mg Oral 4 times per day  . docusate sodium  100 mg Oral BID  . dofetilide  375 mcg Oral BID  . estradiol  1 mg Oral Daily  . folic acid  1 mg Oral Daily  . levothyroxine  137 mcg Oral QAC breakfast  . magnesium oxide  400 mg Oral TID AC & HS  . multivitamin with minerals  1 tablet Oral Daily  . pantoprazole  40 mg Oral Daily  . rivaroxaban  20 mg Oral QAC supper  . simvastatin  40 mg Oral QPM  . sodium chloride  3 mL Intravenous Q12H  . thiamine  100 mg Oral Daily  . torsemide  40 mg Oral Daily   Continuous Infusions:    Active Problems:   Hypothyroidism   Hypotension   Hyperlipidemia   Chronic diastolic CHF (congestive heart failure)   Pyrexia   Renal insufficiency    Time  spent: 25 minutes    [facility] Pager [phone]. If 7PM-7AM, please contact night-coverage at [email or web address], password [facility] 04/14/2014, 3:54 PM

## 2014-04-14 NOTE — Progress Notes (Signed)
Pt c/o wheezing.  Noted wheezing on Bilat. Upper lungs, frontal and posture lungs.  Dr. Karleen Hampshire made aware that  pt and daughter requesting breathing tx.Marland Kitchen

## 2014-04-14 NOTE — Progress Notes (Signed)
Patient ID: [name], female   DOB: [birth year], 74 y.o.   MRN: [id number]    Primary cardiologist:  Subjective:    Feeling better today. Starting to notice some swelling in legs  Objective:   Temp:  [98 F (36.7 C)-98.6 F (37 C)] 98.5 F (36.9 C) (03/19 0625) Pulse Rate:  [65-97] 65 (03/19 1136) Resp:  [18-20] 20 (03/19 0625) BP: (96-122)/(48-90) 102/52 mmHg (03/19 1136) SpO2:  [97 %-99 %] 98 % (03/19 1151) Weight:  [223 lb (101.152 kg)] 223 lb (101.152 kg) (03/19 0625) Last BM Date: 04/12/14  Filed Weights   04/12/14 2038 04/13/14 0504 04/14/14 0625  Weight: 217 lb 11.2 oz (98.748 kg) 219 lb 3.2 oz (99.428 kg) 223 lb (101.152 kg)    Intake/Output Summary (Last 24 hours) at 04/14/14 1245 Last data filed at 04/14/14 1100  Gross per 24 hour  Intake 1692.5 ml  Output   1500 ml  Net  192.5 ml    Telemetry: afib, variable rates  Exam:  General: NAD  Resp: CTAB  Cardiac: irreg, no m/r/g, no JVD  GI: abdomen soft, NT< ND  MSK:: 1+ bilateral LE edema  Neuro: no focal deficits  Psych: appropriate affect  Lab Results:  Basic Metabolic Panel:  Recent Labs Lab 04/12/14 1407 04/13/14 0315 04/14/14 0355  NA 137 140 135  K 4.0 3.4* 3.9  CL 93* 97 95*  CO2 34* 34* 31  GLUCOSE 110* 48* 155*  BUN 21 22 14   CREATININE 1.24* 1.20* 0.96  CALCIUM 9.7 9.2 8.9  MG  --   --  1.7    Liver Function Tests:  Recent Labs Lab 04/13/14 0315  AST 85*  ALT 23  ALKPHOS 45  BILITOT 0.6  PROT 6.6  ALBUMIN 3.5    CBC:  Recent Labs Lab 04/12/14 1407 04/13/14 0315  WBC 5.3 5.5  HGB 9.9* 9.2*  HCT 34.3* 32.1*  MCV 79.6 79.7  PLT 195 199    Cardiac Enzymes:  Recent Labs Lab 04/12/14 2250 04/13/14 0315 04/13/14 0908  TROPONINI 0.06* 0.05* 0.04*    BNP:  Recent Labs  08/01/13 1940 08/03/13 1058 10/01/13 1355  PROBNP 607.1* 256.8* 169.5*    Coagulation: No results for input(s): INR in the last 168 hours.  ECG:   Medications:     Scheduled Medications: . antiseptic oral rinse  7 mL Mouth Rinse BID  . azithromycin  250 mg Oral Daily  . cholecalciferol  5,000 Units Oral Daily  . diltiazem  30 mg Oral 4 times per day  . docusate sodium  100 mg Oral BID  . dofetilide  375 mcg Oral BID  . estradiol  1 mg Oral Daily  . folic acid  1 mg Oral Daily  . levothyroxine  137 mcg Oral QAC breakfast  . magnesium oxide  400 mg Oral TID AC & HS  . magnesium sulfate 1 - 4 g bolus IVPB  1 g Intravenous Once  . multivitamin with minerals  1 tablet Oral Daily  . pantoprazole  40 mg Oral Daily  . potassium chloride  40 mEq Oral Once  . rivaroxaban  20 mg Oral QAC supper  . simvastatin  40 mg Oral QPM  . sodium chloride  3 mL Intravenous Q12H  . thiamine  100 mg Oral Daily     Infusions:     PRN Medications:  acetaminophen **OR** acetaminophen, clonazePAM, ipratropium, levalbuterol, ondansetron (ZOFRAN) IV, promethazine, simethicone, traMADol     Assessment/Plan  1. Chronic diastolic HF - 0000000 echo LVEF 0000000, grade I diastolic dysfunction - admitted with weakness after increased diuretics at home. Found to be hypotensive and hypovolemic. From notes she had received a recent IV dose of lasix 80mg  in the cardiology office and also started on demadex 40mg /20mg . Cr bumed from 1.05 to 1.24, has trended down with IVF and now normalized - CXR clear. - will change torsemide to 40mg  daily and follow uop today.    2. PAF - continue tikasyn and xarelto at current doses.  - elevated rates overnight, was transiently on dilt gtt. Now on dilt 30mg  q 6hrs, will continue   Carlyle Dolly, M.D.

## 2014-04-14 NOTE — Progress Notes (Signed)
UR completed 

## 2014-04-14 NOTE — Progress Notes (Signed)
Dr. Karleen Hampshire notified of pt spontaneously converting to NSR at 1630. Pt continues to remain in NSR. Will continue to monitor pt.

## 2014-04-14 NOTE — Progress Notes (Signed)
Pt HR on the 130's - 140's sustaining. [name] m,ade aware with orders to start Cardizem drip which was carried out.pt asymptomatic. No c/o CP nor SOB. Continued to observe pt closely

## 2014-04-15 ENCOUNTER — Encounter (HOSPITAL_COMMUNITY): Payer: Self-pay | Admitting: *Deleted

## 2014-04-15 DIAGNOSIS — I5032 Chronic diastolic (congestive) heart failure: Secondary | ICD-10-CM | POA: Diagnosis not present

## 2014-04-15 DIAGNOSIS — N289 Disorder of kidney and ureter, unspecified: Secondary | ICD-10-CM | POA: Diagnosis not present

## 2014-04-15 DIAGNOSIS — I959 Hypotension, unspecified: Secondary | ICD-10-CM | POA: Diagnosis not present

## 2014-04-15 LAB — GLUCOSE, CAPILLARY
GLUCOSE-CAPILLARY: 204 mg/dL — AB (ref 70–99)
GLUCOSE-CAPILLARY: 263 mg/dL — AB (ref 70–99)
Glucose-Capillary: 196 mg/dL — ABNORMAL HIGH (ref 70–99)
Glucose-Capillary: 206 mg/dL — ABNORMAL HIGH (ref 70–99)

## 2014-04-15 LAB — BASIC METABOLIC PANEL
Anion gap: 10 (ref 5–15)
BUN: 16 mg/dL (ref 6–23)
CHLORIDE: 93 mmol/L — AB (ref 96–112)
CO2: 34 mmol/L — ABNORMAL HIGH (ref 19–32)
Calcium: 9.1 mg/dL (ref 8.4–10.5)
Creatinine, Ser: 1.14 mg/dL — ABNORMAL HIGH (ref 0.50–1.10)
GFR calc Af Amer: 55 mL/min — ABNORMAL LOW (ref >90)
GFR, EST NON AFRICAN AMERICAN: 47 mL/min — AB (ref >90)
Glucose, Bld: 226 mg/dL — ABNORMAL HIGH (ref 70–99)
POTASSIUM: 4.1 mmol/L (ref 3.5–5.1)
Sodium: 137 mmol/L (ref 135–145)

## 2014-04-15 LAB — FOLATE

## 2014-04-15 LAB — FERRITIN: Ferritin: 24 ng/mL (ref 10–291)

## 2014-04-15 LAB — VITAMIN B12: Vitamin B-12: 311 pg/mL (ref 211–911)

## 2014-04-15 MED ORDER — INSULIN GLARGINE 100 UNIT/ML ~~LOC~~ SOLN: 20.0000 [IU] | Freq: Every day | SUBCUTANEOUS | Status: DC

## 2014-04-15 MED ORDER — FERROUS SULFATE 325 (65 FE) MG PO TABS
325.0000 mg | ORAL_TABLET | Freq: Every day | ORAL | Status: DC
  Administered 2014-04-16 – 2014-04-17 (×2): 325 mg via ORAL
  Filled 2014-04-15 (×4): qty 1

## 2014-04-15 MED ORDER — INSULIN GLARGINE 100 UNIT/ML ~~LOC~~ SOLN
20.0000 [IU] | Freq: Two times a day (BID) | SUBCUTANEOUS | Status: DC
  Administered 2014-04-15 – 2014-04-16 (×2): 20 [IU] via SUBCUTANEOUS
  Filled 2014-04-15 (×3): qty 0.2

## 2014-04-15 NOTE — Progress Notes (Addendum)
Pt remains in NSR at this time. Tykosin orders for 0800. Paged PA Lyda Jester for orders. Dr. Harl Bowie arrived to floor. He stated he will see pt. Will continue to monitor. Per Dr. Harl Bowie it is okay to give Tykosin dose.

## 2014-04-15 NOTE — Progress Notes (Signed)
[facility] PROGRESS NOTE  SHARRI PARRADO [id number] DOB: [birth year] DOA: 04/12/2014 PCP: Octavio Graves, DO Interim summary: 74 year old lady admitted for fever, myalgia,s and generalized weakness. She was tested positive for flu , tamiflu started but patient didn't want it , as it was making her sick on her stomach. We stopped the tamiflu.  Assessment/Plan: 1. Generalized weakness probably from hypotension from hypovolemia: Resolved with fluids and fluid were stopped as she was developing pedal edema.  Her BP parameters have improved.   Elevated troponin's: Probably from demand ischemia from hypotension. She denies any chest pain. EKG reviewed, normal sinus rhythm with t wave abnormalities.      Diabetes mellitus: CBG (last 3)   Recent Labs  04/15/14 0555 04/15/14 1112 04/15/14 1611  GLUCAP 204* 263* 196*   Her CBG'S were running low on the day of admission and all oral meds and lantus  have been discontinued. She has her appettite back and her cbgs are back to normal. Resumed her lantus on 3/19 and would slowly increase the dose to the home dose . Currently back on 20 units of lantus BID.  Continue with SSI.    Acute on Chronic diastolic heart failure: Appears slightly fluid overload, with pedal edema. She was restarted back on the torsemide 40 mg daily.  Daily weights and strict intake and output monitor.   She has urinated 3.7 liters of fluid yesterday on torsemide 40.  She reports feeling better. Continue the same dose .   Influenza B PCR positive.  BLood cultures not done on admission.  UA does not show any infection.  CXR does not show any pneumonia.  z pack for bronchitis.  Started on tamiflu but patient refused it as it was making her sick on her stomach and we have stopped it.    Hypothyroidism: Resume synthroid.   Chronic atrial fibrillation: Went in to AFIB with RVR, on 3/18 night , and she was started on cardizem IV , as her rate improved, she  was transitioned to po cardizem on the morning of 3/19. Her Rate looks good. Plan to change the cardizem to long acting in am depending on her rate.  She is on xarelto and tikosyn.  Her potassium and magnesium were replaced. Repeat in am while on torsemide.    Anemia; Normocytic.anemia panel shows low iron levels. And ferritin . Ferrous sulfate ordered.  Stool for occult blood still pending.  Monitor.    Hypokalemia: replaced as needed.     Acute on chronic renal failure: Probably from diuresis. Continue to monitor.    Code Status: full code.  Family Communication:none at bedside.  Disposition Plan: home when she has improved.    Consultants:  cardiology  Procedures:  none  Antibiotics: z pack HPI/Subjective: Feeling much better today.   Objective: Filed Vitals:   04/15/14 1324  BP: 107/57  Pulse: 59  Temp: 98.4 F (36.9 C)  Resp: 20    Intake/Output Summary (Last 24 hours) at 04/15/14 1657 Last data filed at 04/15/14 1325  Gross per 24 hour  Intake   1140 ml  Output   3925 ml  Net  -2785 ml   Filed Weights   04/13/14 0504 04/14/14 0625 04/15/14 0541  Weight: 99.428 kg (219 lb 3.2 oz) 101.152 kg (223 lb) 100.5 kg (221 lb 9 oz)    Exam:   General:  Alert afebrile comfortable on oxygen,   Cardiovascular: s1s2, no mrg, regular.   Respiratory: no wheezing, no rhonchi,  air entry fair.   Abdomen: soft non tender non distended bowel sounds heard  Musculoskeletal: 1+ edema.   Data Reviewed: Basic Metabolic Panel:  Recent Labs Lab 04/11/14 1244 04/12/14 1407 04/13/14 0315 04/14/14 0355 04/15/14 0541  NA 137 137 140 135 137  K 3.8 4.0 3.4* 3.9 4.1  CL 98 93* 97 95* 93*  CO2 28 34* 34* 31 34*  GLUCOSE 226* 110* 48* 155* 226*  BUN 18 21 22 14 16   CREATININE 1.05 1.24* 1.20* 0.96 1.14*  CALCIUM 9.5 9.7 9.2 8.9 9.1  MG  --   --   --  1.7  --    Liver Function Tests:  Recent Labs Lab 04/13/14 0315  AST 85*  ALT 23  ALKPHOS 45  BILITOT  0.6  PROT 6.6  ALBUMIN 3.5   No results for input(s): LIPASE, AMYLASE in the last 168 hours. No results for input(s): AMMONIA in the last 168 hours. CBC:  Recent Labs Lab 04/12/14 1407 04/13/14 0315  WBC 5.3 5.5  HGB 9.9* 9.2*  HCT 34.3* 32.1*  MCV 79.6 79.7  PLT 195 199   Cardiac Enzymes:  Recent Labs Lab 04/12/14 1407 04/12/14 1715 04/12/14 2250 04/13/14 0315 04/13/14 0908  TROPONINI 0.07* 0.07* 0.06* 0.05* 0.04*   BNP (last 3 results)  Recent Labs  03/19/14 1537 04/11/14 1245 04/12/14 1407  BNP 87.3  85.7 422.0* 447.7*    ProBNP (last 3 results)  Recent Labs  08/01/13 1940 08/03/13 1058 10/01/13 1355  PROBNP 607.1* 256.8* 169.5*    CBG:  Recent Labs Lab 04/14/14 1135 04/14/14 2153 04/15/14 0555 04/15/14 1112 04/15/14 1611  GLUCAP 189* 196* 204* 263* 196*    Recent Results (from the past 240 hour(s))  Urine culture     Status: None   Collection Time: 04/12/14  5:35 PM  Result Value Ref Range Status   Specimen Description URINE, CATHETERIZED  Final   Special Requests NONE  Final   Colony Count NO GROWTH Performed at [facility]   Final   Culture NO GROWTH Performed at [facility]   Final   Report Status 04/14/2014 FINAL  Final     Studies: No results found.  Scheduled Meds: . antiseptic oral rinse  7 mL Mouth Rinse BID  . azithromycin  250 mg Oral Daily  . cholecalciferol  5,000 Units Oral Daily  . diltiazem  30 mg Oral 4 times per day  . docusate sodium  100 mg Oral BID  . dofetilide  375 mcg Oral BID  . estradiol  1 mg Oral Daily  . folic acid  1 mg Oral Daily  . insulin aspart  0-9 Units Subcutaneous TID WC  . insulin glargine  10 Units Subcutaneous QHS  . levothyroxine  137 mcg Oral QAC breakfast  . magnesium oxide  400 mg Oral TID AC & HS  . multivitamin with minerals  1 tablet Oral Daily  . pantoprazole  40 mg Oral Daily  . rivaroxaban  20 mg Oral QAC supper  . simvastatin  40 mg Oral QPM  .  sodium chloride  3 mL Intravenous Q12H  . thiamine  100 mg Oral Daily   Continuous Infusions:    Active Problems:   Hypothyroidism   Hypotension   Hyperlipidemia   Chronic diastolic CHF (congestive heart failure)   Pyrexia   Renal insufficiency   Arterial hypotension    Time spent: 25 minutes    [facility] Pager [phone]. If 7PM-7AM,  please contact night-coverage at [email or web address], password [facility] 04/15/2014, 4:57 PM

## 2014-04-15 NOTE — Progress Notes (Signed)
Patient ID: [name], female   DOB: 02/10/1940, 74 y.o.   MRN: [id number]    Subjective:   Weakness is improving   Objective:   Temp:  [97.8 F (36.6 C)-98.4 F (36.9 C)] 97.8 F (36.6 C) (03/20 0541) Pulse Rate:  [61-65] 63 (03/20 0541) Resp:  [18-20] 18 (03/20 0541) BP: (102-121)/(45-57) 116/48 mmHg (03/20 0541) SpO2:  [95 %-100 %] 96 % (03/20 0541) Weight:  [221 lb 9 oz (100.5 kg)] 221 lb 9 oz (100.5 kg) (03/20 0541) Last BM Date: 04/12/14  Filed Weights   04/13/14 0504 04/14/14 0625 04/15/14 0541  Weight: 219 lb 3.2 oz (99.428 kg) 223 lb (101.152 kg) 221 lb 9 oz (100.5 kg)    Intake/Output Summary (Last 24 hours) at 04/15/14 0935 Last data filed at 04/15/14 0500  Gross per 24 hour  Intake   1120 ml  Output   3725 ml  Net  -2605 ml    Telemetry: afib RVR overnight, converted to NSR this AM  Exam:  General: no focal deficits  Resp: CTAB  Cardiac: RRR, no m/r/g, no JVD  GI: abdomen soft, NT, ND  MSK: trace bilateral edema  Neuro: no focal deficits   Lab Results:  Basic Metabolic Panel:  Recent Labs Lab 04/13/14 0315 04/14/14 0355 04/15/14 0541  NA 140 135 137  K 3.4* 3.9 4.1  CL 97 95* 93*  CO2 34* 31 34*  GLUCOSE 48* 155* 226*  BUN 22 14 16   CREATININE 1.20* 0.96 1.14*  CALCIUM 9.2 8.9 9.1  MG  --  1.7  --     Liver Function Tests:  Recent Labs Lab 04/13/14 0315  AST 85*  ALT 23  ALKPHOS 45  BILITOT 0.6  PROT 6.6  ALBUMIN 3.5    CBC:  Recent Labs Lab 04/12/14 1407 04/13/14 0315  WBC 5.3 5.5  HGB 9.9* 9.2*  HCT 34.3* 32.1*  MCV 79.6 79.7  PLT 195 199    Cardiac Enzymes:  Recent Labs Lab 04/12/14 2250 04/13/14 0315 04/13/14 0908  TROPONINI 0.06* 0.05* 0.04*    BNP:  Recent Labs  08/01/13 1940 08/03/13 1058 10/01/13 1355  PROBNP 607.1* 256.8* 169.5*    Coagulation: No results for input(s): INR in the last 168 hours.  ECG:   Medications:   Scheduled Medications: . antiseptic oral rinse  7  mL Mouth Rinse BID  . azithromycin  250 mg Oral Daily  . cholecalciferol  5,000 Units Oral Daily  . diltiazem  30 mg Oral 4 times per day  . docusate sodium  100 mg Oral BID  . dofetilide  375 mcg Oral BID  . estradiol  1 mg Oral Daily  . folic acid  1 mg Oral Daily  . insulin aspart  0-9 Units Subcutaneous TID WC  . insulin glargine  10 Units Subcutaneous QHS  . levothyroxine  137 mcg Oral QAC breakfast  . magnesium oxide  400 mg Oral TID AC & HS  . multivitamin with minerals  1 tablet Oral Daily  . pantoprazole  40 mg Oral Daily  . rivaroxaban  20 mg Oral QAC supper  . simvastatin  40 mg Oral QPM  . sodium chloride  3 mL Intravenous Q12H  . thiamine  100 mg Oral Daily  . torsemide  40 mg Oral Daily     Infusions:     PRN Medications:  acetaminophen **OR** acetaminophen, clonazePAM, ipratropium, levalbuterol, ondansetron (ZOFRAN) IV, promethazine, simethicone, traMADol     Assessment/Plan  1. Chronic diastolic HF - 0000000 echo LVEF 0000000, grade I diastolic dysfunction - admitted with weakness after increased diuretics at home. Found to be hypotensive and hypovolemic. From notes she had received a recent IV dose of lasix 80mg  in the cardiology office and also started on demadex 40mg /20mg . Cr bumped from 1.05 to 1.24, trended down to 0.96 however after restarting lower dose of torsemide at 40mg  daily trended back up to 1.14. From notes with one dose of torsemide 40 she put out 3.7 liters of urine and was negative 2.3 liters. - hold diuretic today, 40mg  once daily will be too much for maintence dose, likely 20mg  daily - has had issues with hyper and hypovolemia recently, while inpatient will closels monitor and work to choose best diuretic dosing.   2. PAF - afib with RVR overnight, converted to NSR this AM. Continue tikasyn and dilt, likely change to long acting dilt tomorrow if rates and bp remain stable.    3. Influenza B + - per primary team, likely contributing to  her weakness.     Carlyle Dolly, M.D.

## 2014-04-15 NOTE — Progress Notes (Signed)
UR completed 

## 2014-04-16 ENCOUNTER — Other Ambulatory Visit: Payer: Medicare HMO

## 2014-04-16 ENCOUNTER — Encounter: Payer: Self-pay | Admitting: Internal Medicine

## 2014-04-16 DIAGNOSIS — I248 Other forms of acute ischemic heart disease: Secondary | ICD-10-CM | POA: Diagnosis present

## 2014-04-16 DIAGNOSIS — N189 Chronic kidney disease, unspecified: Secondary | ICD-10-CM | POA: Diagnosis present

## 2014-04-16 DIAGNOSIS — Z9102 Food additives allergy status: Secondary | ICD-10-CM | POA: Diagnosis not present

## 2014-04-16 DIAGNOSIS — I4891 Unspecified atrial fibrillation: Secondary | ICD-10-CM

## 2014-04-16 DIAGNOSIS — F419 Anxiety disorder, unspecified: Secondary | ICD-10-CM | POA: Diagnosis present

## 2014-04-16 DIAGNOSIS — N289 Disorder of kidney and ureter, unspecified: Secondary | ICD-10-CM | POA: Diagnosis not present

## 2014-04-16 DIAGNOSIS — I4581 Long QT syndrome: Secondary | ICD-10-CM | POA: Diagnosis present

## 2014-04-16 DIAGNOSIS — E860 Dehydration: Secondary | ICD-10-CM | POA: Diagnosis present

## 2014-04-16 DIAGNOSIS — E785 Hyperlipidemia, unspecified: Secondary | ICD-10-CM | POA: Diagnosis present

## 2014-04-16 DIAGNOSIS — Z888 Allergy status to other drugs, medicaments and biological substances status: Secondary | ICD-10-CM | POA: Diagnosis not present

## 2014-04-16 DIAGNOSIS — J101 Influenza due to other identified influenza virus with other respiratory manifestations: Secondary | ICD-10-CM | POA: Diagnosis present

## 2014-04-16 DIAGNOSIS — I471 Supraventricular tachycardia: Secondary | ICD-10-CM | POA: Diagnosis present

## 2014-04-16 DIAGNOSIS — Z87442 Personal history of urinary calculi: Secondary | ICD-10-CM | POA: Diagnosis not present

## 2014-04-16 DIAGNOSIS — E11649 Type 2 diabetes mellitus with hypoglycemia without coma: Secondary | ICD-10-CM | POA: Diagnosis present

## 2014-04-16 DIAGNOSIS — Z9049 Acquired absence of other specified parts of digestive tract: Secondary | ICD-10-CM | POA: Diagnosis present

## 2014-04-16 DIAGNOSIS — R0602 Shortness of breath: Secondary | ICD-10-CM | POA: Diagnosis present

## 2014-04-16 DIAGNOSIS — Z794 Long term (current) use of insulin: Secondary | ICD-10-CM | POA: Diagnosis not present

## 2014-04-16 DIAGNOSIS — I480 Paroxysmal atrial fibrillation: Secondary | ICD-10-CM | POA: Diagnosis not present

## 2014-04-16 DIAGNOSIS — E559 Vitamin D deficiency, unspecified: Secondary | ICD-10-CM | POA: Diagnosis present

## 2014-04-16 DIAGNOSIS — E861 Hypovolemia: Secondary | ICD-10-CM | POA: Diagnosis present

## 2014-04-16 DIAGNOSIS — D649 Anemia, unspecified: Secondary | ICD-10-CM | POA: Diagnosis present

## 2014-04-16 DIAGNOSIS — I130 Hypertensive heart and chronic kidney disease with heart failure and stage 1 through stage 4 chronic kidney disease, or unspecified chronic kidney disease: Secondary | ICD-10-CM | POA: Diagnosis present

## 2014-04-16 DIAGNOSIS — Z7901 Long term (current) use of anticoagulants: Secondary | ICD-10-CM | POA: Diagnosis not present

## 2014-04-16 DIAGNOSIS — I959 Hypotension, unspecified: Secondary | ICD-10-CM | POA: Diagnosis not present

## 2014-04-16 DIAGNOSIS — F329 Major depressive disorder, single episode, unspecified: Secondary | ICD-10-CM | POA: Diagnosis present

## 2014-04-16 DIAGNOSIS — K219 Gastro-esophageal reflux disease without esophagitis: Secondary | ICD-10-CM | POA: Diagnosis present

## 2014-04-16 DIAGNOSIS — Z86010 Personal history of colonic polyps: Secondary | ICD-10-CM | POA: Diagnosis not present

## 2014-04-16 DIAGNOSIS — I2510 Atherosclerotic heart disease of native coronary artery without angina pectoris: Secondary | ICD-10-CM | POA: Diagnosis present

## 2014-04-16 DIAGNOSIS — I5043 Acute on chronic combined systolic (congestive) and diastolic (congestive) heart failure: Secondary | ICD-10-CM | POA: Diagnosis present

## 2014-04-16 DIAGNOSIS — I429 Cardiomyopathy, unspecified: Secondary | ICD-10-CM | POA: Diagnosis present

## 2014-04-16 DIAGNOSIS — E669 Obesity, unspecified: Secondary | ICD-10-CM | POA: Diagnosis present

## 2014-04-16 DIAGNOSIS — I5032 Chronic diastolic (congestive) heart failure: Secondary | ICD-10-CM | POA: Diagnosis not present

## 2014-04-16 DIAGNOSIS — E039 Hypothyroidism, unspecified: Secondary | ICD-10-CM | POA: Diagnosis present

## 2014-04-16 DIAGNOSIS — Z90710 Acquired absence of both cervix and uterus: Secondary | ICD-10-CM | POA: Diagnosis not present

## 2014-04-16 DIAGNOSIS — Z6841 Body Mass Index (BMI) 40.0 and over, adult: Secondary | ICD-10-CM | POA: Diagnosis not present

## 2014-04-16 DIAGNOSIS — M1990 Unspecified osteoarthritis, unspecified site: Secondary | ICD-10-CM | POA: Diagnosis present

## 2014-04-16 DIAGNOSIS — Z880 Allergy status to penicillin: Secondary | ICD-10-CM | POA: Diagnosis not present

## 2014-04-16 DIAGNOSIS — J1189 Influenza due to unidentified influenza virus with other manifestations: Secondary | ICD-10-CM | POA: Diagnosis not present

## 2014-04-16 DIAGNOSIS — G629 Polyneuropathy, unspecified: Secondary | ICD-10-CM | POA: Diagnosis present

## 2014-04-16 DIAGNOSIS — Z9981 Dependence on supplemental oxygen: Secondary | ICD-10-CM | POA: Diagnosis not present

## 2014-04-16 DIAGNOSIS — N179 Acute kidney failure, unspecified: Secondary | ICD-10-CM | POA: Diagnosis present

## 2014-04-16 DIAGNOSIS — I472 Ventricular tachycardia: Secondary | ICD-10-CM | POA: Diagnosis not present

## 2014-04-16 DIAGNOSIS — I5033 Acute on chronic diastolic (congestive) heart failure: Secondary | ICD-10-CM | POA: Diagnosis not present

## 2014-04-16 DIAGNOSIS — I482 Chronic atrial fibrillation: Secondary | ICD-10-CM | POA: Diagnosis present

## 2014-04-16 DIAGNOSIS — Z881 Allergy status to other antibiotic agents status: Secondary | ICD-10-CM | POA: Diagnosis not present

## 2014-04-16 DIAGNOSIS — R55 Syncope and collapse: Secondary | ICD-10-CM | POA: Diagnosis not present

## 2014-04-16 DIAGNOSIS — E876 Hypokalemia: Secondary | ICD-10-CM | POA: Diagnosis present

## 2014-04-16 DIAGNOSIS — J45909 Unspecified asthma, uncomplicated: Secondary | ICD-10-CM | POA: Diagnosis present

## 2014-04-16 HISTORY — DX: Unspecified atrial fibrillation: I48.91

## 2014-04-16 LAB — BASIC METABOLIC PANEL
Anion gap: 10 (ref 5–15)
BUN: 17 mg/dL (ref 6–23)
CALCIUM: 9.4 mg/dL (ref 8.4–10.5)
CO2: 34 mmol/L — ABNORMAL HIGH (ref 19–32)
CREATININE: 1.08 mg/dL (ref 0.50–1.10)
Chloride: 93 mmol/L — ABNORMAL LOW (ref 96–112)
GFR calc non Af Amer: 50 mL/min — ABNORMAL LOW (ref >90)
GFR, EST AFRICAN AMERICAN: 58 mL/min — AB (ref >90)
GLUCOSE: 234 mg/dL — AB (ref 70–99)
POTASSIUM: 3.9 mmol/L (ref 3.5–5.1)
Sodium: 137 mmol/L (ref 135–145)

## 2014-04-16 LAB — CBC
HCT: 35.7 % — ABNORMAL LOW (ref 36.0–46.0)
Hemoglobin: 10.3 g/dL — ABNORMAL LOW (ref 12.0–15.0)
MCH: 23 pg — AB (ref 26.0–34.0)
MCHC: 28.9 g/dL — ABNORMAL LOW (ref 30.0–36.0)
MCV: 79.9 fL (ref 78.0–100.0)
PLATELETS: 210 10*3/uL (ref 150–400)
RBC: 4.47 MIL/uL (ref 3.87–5.11)
RDW: 17.4 % — AB (ref 11.5–15.5)
WBC: 6.6 10*3/uL (ref 4.0–10.5)

## 2014-04-16 LAB — GLUCOSE, CAPILLARY
GLUCOSE-CAPILLARY: 219 mg/dL — AB (ref 70–99)
Glucose-Capillary: 234 mg/dL — ABNORMAL HIGH (ref 70–99)
Glucose-Capillary: 184 mg/dL — ABNORMAL HIGH (ref 70–99)
Glucose-Capillary: 212 mg/dL — ABNORMAL HIGH (ref 70–99)

## 2014-04-16 LAB — MAGNESIUM: Magnesium: 1.8 mg/dL (ref 1.5–2.5)

## 2014-04-16 MED ORDER — METOPROLOL TARTRATE 25 MG PO TABS
25.0000 mg | ORAL_TABLET | Freq: Two times a day (BID) | ORAL | Status: DC
  Administered 2014-04-16 (×2): 25 mg via ORAL
  Filled 2014-04-16 (×2): qty 1

## 2014-04-16 MED ORDER — TORSEMIDE 20 MG PO TABS: 20.0000 mg | ORAL_TABLET | Freq: Every day | ORAL | Status: DC

## 2014-04-16 MED ORDER — INSULIN GLARGINE 100 UNIT/ML ~~LOC~~ SOLN
25.0000 [IU] | Freq: Two times a day (BID) | SUBCUTANEOUS | Status: DC
  Administered 2014-04-16 – 2014-04-19 (×6): 25 [IU] via SUBCUTANEOUS
  Filled 2014-04-16 (×8): qty 0.25

## 2014-04-16 MED ORDER — INSULIN ASPART 100 UNIT/ML ~~LOC~~ SOLN
2.0000 [IU] | Freq: Three times a day (TID) | SUBCUTANEOUS | Status: DC
  Administered 2014-04-17 – 2014-04-19 (×8): 2 [IU] via SUBCUTANEOUS

## 2014-04-16 MED ORDER — POTASSIUM CHLORIDE CRYS ER 20 MEQ PO TBCR
20.0000 meq | EXTENDED_RELEASE_TABLET | Freq: Every day | ORAL | Status: DC
  Administered 2014-04-16 – 2014-04-19 (×4): 20 meq via ORAL
  Filled 2014-04-16 (×3): qty 1

## 2014-04-16 MED ORDER — MAGNESIUM SULFATE IN D5W 10-5 MG/ML-% IV SOLN
1.0000 g | Freq: Once | INTRAVENOUS | Status: AC
  Administered 2014-04-16: 1 g via INTRAVENOUS
  Filled 2014-04-16: qty 100

## 2014-04-16 NOTE — Progress Notes (Signed)
UR completed 

## 2014-04-16 NOTE — Progress Notes (Signed)
PA Dayna Dunn making rounds on floor. Notified by CCMD at 0730 that pt was in afib controlled now. Notified Melina Copa PA. No new order given, will continue to monitor pt.

## 2014-04-16 NOTE — Progress Notes (Signed)
Calculated pt QTC on central monitor and qtc was 0.40 sec. Per order it is okay to administer medication.

## 2014-04-16 NOTE — Progress Notes (Signed)
1730 paged Rosaria Ferries PA regarding pt 6 bts vtach and bigeminy on monitor. Lauretta Chester PA ordered 2200 dose of metoprolol to be administered now. Pt in stable condition. Will continue to monitor.

## 2014-04-16 NOTE — Progress Notes (Signed)
Inpatient Diabetes Program Recommendations  AACE/ADA: New Consensus Statement on Inpatient Glycemic Control (2013)  Target Ranges:  Prepandial:   less than 140 mg/dL      Peak postprandial:   less than 180 mg/dL (1-2 hours)      Critically ill patients:  140 - 180 mg/dL   Inpatient Diabetes Program Recommendations Insulin - Basal: Please increase lantus doses to 25 units bid (home dose at 35 units bid) Correction (SSI): Please consider using only sensitive correction tidwc and HS scale at this time. Insulin - Meal Coverage: Please consider addition of low dose meal coverage of 3 units tidwc Oral Agents: [email or web address]  Thank you Rosita Kea, RN, MSN, CDE  Diabetes Inpatient Program Office: [phone] Pager: [phone] 8:00 am to 5:00 pm

## 2014-04-16 NOTE — Care Management Note (Signed)
    Page 1 of 1   04/19/2014     11:04:01 AM CARE MANAGEMENT NOTE 04/19/2014  Patient:  [name], [name]   Account Number:  [id number]  Date Initiated:  04/16/2014  Documentation initiated by:  AMERSON,JULIE  Subjective/Objective Assessment:   Pt adm on 04/12/14 with CHF exacerbation, Afib.  PTA, pt resides at home with son.  She is on chronic home oxygen, provided by [facility].     Action/Plan:   Will follow for dc needs as pt progresses.   Anticipated DC Date:  04/18/2014   Anticipated DC Plan:  [place]  CM consult      Choice offered to / List presented to:             Status of service:  Completed, signed off Medicare Important Message given?  YES (If response is "NO", the following Medicare IM given date fields will be blank) Date Medicare IM given:  04/16/2014 Medicare IM given by:  AMERSON,JULIE Date Additional Medicare IM given:   Additional Medicare IM given by:    Discharge Disposition:  HOME/SELF CARE  Per UR Regulation:  Reviewed for med. necessity/level of care/duration of stay  If discussed at [place] of Stay Meetings, dates discussed:    Comments:  04-19-14 [place], RN,BSN [phone] CM did speak with pt and family is to bring 02 tank later this evening after 3:30. No home needs identified. No further needs from CM at this time.

## 2014-04-16 NOTE — Significant Event (Signed)
Paged re pauses and VT  It appears she is having pauses (none too long) and short runs of torsades, likely related to combined use of azithromycin, zofran, and dofetilide.  I discontinued these medications as well as the metoprolol as TdP is pause dependent.  Please replace azithromycin with an antibiotic that does not prolong the QT interval and check electrolytes to ensure K>4 and Mg>2. Please check an ECG and transfer to SDU for closer monitoring.   Above d/w Schorr, NP. She is in agreement with plan.

## 2014-04-16 NOTE — Progress Notes (Signed)
Patient Name: [name] Date of Encounter: 04/16/2014  Active Problems:   Hypothyroidism   Hypotension   PAF (paroxysmal atrial fibrillation)   Hyperlipidemia   Chronic diastolic CHF (congestive heart failure)   Pyrexia   Renal insufficiency   Arterial hypotension   Primary Cardiologist: [name], Bensimhon  Patient Profile: [name] is a 74 yo with HTN, DM , PAF (on Tikosyn and Xarelto), chronic systolic/diastolic CHF (LVEF 45 to [id number] in 12/14) nonobstructive CAD (Cath 03/21/14: RA1- RV46/14, PA 43/9; PCWP 17. LAD 40to 50% mid; LX 50% mid; RCA 40% ostial; LVEF 50 to 55%)   SUBJECTIVE: Weakness and SOB has improved. Noticed leg swelling. Feels palpitations.  OBJECTIVE Filed Vitals:   04/15/14 1324 04/15/14 2108 04/16/14 0658 04/16/14 1008  BP: 107/57 137/59 134/73 113/67  Pulse: 59 66 92 84  Temp: 98.4 F (36.9 C) 98.2 F (36.8 C) 98.4 F (36.9 C) 98.2 F (36.8 C)  TempSrc: Oral Axillary Oral Oral  Resp: 20 17 18 18   Height:      Weight:   217 lb 8 oz (98.657 kg)   SpO2: 92% 95% 96% 99%    Intake/Output Summary (Last 24 hours) at 04/16/14 1341 Last data filed at 04/16/14 1100  Gross per 24 hour  Intake   1260 ml  Output   1700 ml  Net   -440 ml   Filed Weights   04/14/14 0625 04/15/14 0541 04/16/14 0658  Weight: 223 lb (101.152 kg) 221 lb 9 oz (100.5 kg) 217 lb 8 oz (98.657 kg)    PHYSICAL EXAM General: Well developed, well nourished, female in no acute distress. Head: Normocephalic, atraumatic.  Neck: Supple without bruits, JVD. Lungs:  Resp regular and unlabored, CTA. Heart: irregularly irregular, no murmur Abdomen: Soft, non-tender, non-distended, BS + x 4.  Extremities: No clubbing, cyanosis, 1+ edema.  Neuro: Alert and oriented X 3. Moves all extremities spontaneously. Psych: Normal affect.  LABS: CBC:  Recent Labs  04/16/14 0526  WBC 6.6  HGB 10.3*  HCT 35.7*  MCV 79.9  PLT [id number]   Basic Metabolic Panel:  Recent Labs  04/14/14 0355 04/15/14 0541 04/16/14 0526  NA 135 137 137  K 3.9 4.1 3.9  CL 95* 93* 93*  CO2 31 34* 34*  GLUCOSE 155* 226* 234*  BUN 14 16 17   CREATININE 0.96 1.14* 1.08  CALCIUM 8.9 9.1 9.4  MG 1.7  --  1.8   BNP:  B NATRIURETIC PEPTIDE  Date/Time Value Ref Range Status  04/12/2014 02:07 PM 447.7* 0.0 - 100.0 pg/mL Final  04/11/2014 12:45 PM 422.0* 0.0 - 100.0 pg/mL Final   Lab Results  Component Value Date   TSH 1.591 04/12/2014     TELE:  Return to AF midnight. Occasional bradycardia.   Current Medications:  . antiseptic oral rinse  7 mL Mouth Rinse BID  . azithromycin  250 mg Oral Daily  . cholecalciferol  5,000 Units Oral Daily  . diltiazem  30 mg Oral 4 times per day  . docusate sodium  100 mg Oral BID  . dofetilide  375 mcg Oral BID  . estradiol  1 mg Oral Daily  . ferrous sulfate  325 mg Oral Q breakfast  . folic acid  1 mg Oral Daily  . insulin aspart  0-9 Units Subcutaneous TID WC  . insulin glargine  20 Units Subcutaneous BID  . levothyroxine  137 mcg Oral QAC breakfast  . magnesium oxide  400 mg Oral TID AC &  HS  . metoprolol  25 mg Oral BID  . multivitamin with minerals  1 tablet Oral Daily  . pantoprazole  40 mg Oral Daily  . rivaroxaban  20 mg Oral QAC supper  . simvastatin  40 mg Oral QPM  . sodium chloride  3 mL Intravenous Q12H  . thiamine  100 mg Oral Daily      ASSESSMENT AND PLAN:  Acute on Chronic diastolic HF - leg edema noted on exam - Needs diuretics, MD advice for best option  - PTA was on Torsemide 40mg  AM and 20mg  PM - Given 40mg  of Torsemide PO 04/14/14, became hypotensive and hypovolemic - Consider 20mg  daily with additional 20/40mg  PRN  PAF - Back to AF - Continue Xarelto and Tikosyn - also on diltiazem 30 mg qid, will stop this and... - restart home dose of metoprolol at 25 mg bid   Influenza B + and other issues - as per IM Active Problems:   Hypothyroidism   Hypotension   Hyperlipidemia   Pyrexia   Renal  insufficiency   Arterial hypotension   Signed, Rosaria Ferries , PA-C 1:41 PM 04/16/2014   Patient seen, examined. Available data reviewed. Agree with findings, assessment, and plan as outlined by Rosaria Ferries, PA-C. The patient was independently interviewed and examined. She is in no acute distress. The patient is obese. She has tested positive for influenza. I agree with restarting metoprolol and discontinuing diltiazem. She will continue on Xarelto. Will start back on torsemide 20 mg daily. Will recheck renal function tomorrow morning.  Sherren Mocha, M.D. 04/16/2014 2:26 PM

## 2014-04-16 NOTE — Progress Notes (Signed)
[facility] PROGRESS NOTE  [name] [id number] DOB: [birth year] DOA: 04/12/2014 PCP: Octavio Graves, DO Interim summary: 74 year old lady admitted for fever, myalgia,s and generalized weakness. She was tested positive for flu , tamiflu started but patient didn't want it , as it was making her sick on her stomach. We stopped the tamiflu. She waas started on torsemide for diuresis by cardiology.  Assessment/Plan: 1. Generalized weakness probably from hypotension from hypovolemia: Resolved with gentle hydration and fluid were stopped as she was developing pedal edema.  Her BP parameters have improved.   Elevated troponin's: Probably from demand ischemia from hypotension. She denies any chest pain. No further work up as per cardiology.    Diabetes mellitus: CBG (last 3)   Recent Labs  04/16/14 0616 04/16/14 1110 04/16/14 1637  GLUCAP 219* 234* 184*   Her CBG'S were running low on the day of admission and all oral meds and lantus  have been discontinued. She has her appettite back and her cbgs are slowly increasing. Resumed her lantus on 3/19 and would slowly increase the dose to the home dose . Increase the dose to 25 units BID. Add 2 units of premeal coverage.    Acute on Chronic diastolic heart failure: Appears slightly fluid overload, with pedal edema. She was restarted back on the torsemide 40 mg daily.  Daily weights and strict intake and output monitor.   Urine output is 1.5 liters yesterday. Cardiology decreased the dose of the torsemide to 20 mg daily. Renal function is better.  She reports feeling better.   Influenza B PCR positive.  BLood cultures not done on admission.  UA does not show any infection.  CXR does not show any pneumonia.  z pack for bronchitis. Last dose tomorrow.  Started on tamiflu but patient refused it as it was making her sick on her stomach and we have stopped it.    Hypothyroidism: Resume synthroid.   Chronic atrial  fibrillation: Went in to AFIB with RVR, on 3/18 night , and she was started on cardizem IV , as her rate improved, she was transitioned to po cardizem on the morning of 3/19. Her Rate looks good. She had few beats fo V Tach and her cardizem changed to metoprolol.  She is on xarelto and tikosyn.  Her potassium and magnesium were replaced. Repeat in am while on torsemide.    Anemia; Normocytic.anemia panel shows low iron levels. And ferritin . Ferrous sulfate ordered.  Stool for occult blood still pending.  Monitor.    Hypokalemia: replaced as needed.     Acute on chronic renal failure: Probably from diuresis. Better today.    Code Status: full code.  Family Communication:none at bedside.  Disposition Plan: home when she has improved.    Consultants:  cardiology  Procedures:  none  Antibiotics: z pack HPI/Subjective: Feeling much better today. Wants to know when she can go home.   Objective: Filed Vitals:   04/16/14 1509  BP: 122/57  Pulse: 86  Temp: 97.7 F (36.5 C)  Resp: 18    Intake/Output Summary (Last 24 hours) at 04/16/14 1829 Last data filed at 04/16/14 1756  Gross per 24 hour  Intake   1740 ml  Output   1700 ml  Net     40 ml   Filed Weights   04/14/14 0625 04/15/14 0541 04/16/14 0658  Weight: 101.152 kg (223 lb) 100.5 kg (221 lb 9 oz) 98.657 kg (217 lb 8 oz)    Exam:  General:  Alert afebrile comfortable on oxygen,   Cardiovascular: s1s2, no mrg, regular.   Respiratory: no wheezing, no rhonchi, air entry fair.   Abdomen: soft non tender non distended bowel sounds heard  Musculoskeletal: 1+ edema.   Data Reviewed: Basic Metabolic Panel:  Recent Labs Lab 04/12/14 1407 04/13/14 0315 04/14/14 0355 04/15/14 0541 04/16/14 0526  NA 137 140 135 137 137  K 4.0 3.4* 3.9 4.1 3.9  CL 93* 97 95* 93* 93*  CO2 34* 34* 31 34* 34*  GLUCOSE 110* 48* 155* 226* 234*  BUN 21 22 14 16 17   CREATININE 1.24* 1.20* 0.96 1.14* 1.08  CALCIUM 9.7  9.2 8.9 9.1 9.4  MG  --   --  1.7  --  1.8   Liver Function Tests:  Recent Labs Lab 04/13/14 0315  AST 85*  ALT 23  ALKPHOS 45  BILITOT 0.6  PROT 6.6  ALBUMIN 3.5   No results for input(s): LIPASE, AMYLASE in the last 168 hours. No results for input(s): AMMONIA in the last 168 hours. CBC:  Recent Labs Lab 04/12/14 1407 04/13/14 0315 04/16/14 0526  WBC 5.3 5.5 6.6  HGB 9.9* 9.2* 10.3*  HCT 34.3* 32.1* 35.7*  MCV 79.6 79.7 79.9  PLT 195 199 210   Cardiac Enzymes:  Recent Labs Lab 04/12/14 1407 04/12/14 1715 04/12/14 2250 04/13/14 0315 04/13/14 0908  TROPONINI 0.07* 0.07* 0.06* 0.05* 0.04*   BNP (last 3 results)  Recent Labs  03/19/14 1537 04/11/14 1245 04/12/14 1407  BNP 87.3  85.7 422.0* 447.7*    ProBNP (last 3 results)  Recent Labs  08/01/13 1940 08/03/13 1058 10/01/13 1355  PROBNP 607.1* 256.8* 169.5*    CBG:  Recent Labs Lab 04/15/14 1611 04/15/14 2105 04/16/14 0616 04/16/14 1110 04/16/14 1637  GLUCAP 196* 206* 219* 234* 184*    Recent Results (from the past 240 hour(s))  Urine culture     Status: None   Collection Time: 04/12/14  5:35 PM  Result Value Ref Range Status   Specimen Description URINE, CATHETERIZED  Final   Special Requests NONE  Final   Colony Count NO GROWTH Performed at [facility]   Final   Culture NO GROWTH Performed at [facility]   Final   Report Status 04/14/2014 FINAL  Final     Studies: No results found.  Scheduled Meds: . antiseptic oral rinse  7 mL Mouth Rinse BID  . azithromycin  250 mg Oral Daily  . cholecalciferol  5,000 Units Oral Daily  . docusate sodium  100 mg Oral BID  . dofetilide  375 mcg Oral BID  . estradiol  1 mg Oral Daily  . ferrous sulfate  325 mg Oral Q breakfast  . folic acid  1 mg Oral Daily  . insulin aspart  0-9 Units Subcutaneous TID WC  . insulin glargine  20 Units Subcutaneous BID  . levothyroxine  137 mcg Oral QAC breakfast  . magnesium oxide   400 mg Oral TID AC & HS  . metoprolol  25 mg Oral BID  . multivitamin with minerals  1 tablet Oral Daily  . pantoprazole  40 mg Oral Daily  . potassium chloride  20 mEq Oral Daily  . rivaroxaban  20 mg Oral QAC supper  . simvastatin  40 mg Oral QPM  . sodium chloride  3 mL Intravenous Q12H  . thiamine  100 mg Oral Daily  . [START ON 04/17/2014] torsemide  20 mg Oral Daily  Continuous Infusions:    Active Problems:   Hypothyroidism   Hypotension   PAF (paroxysmal atrial fibrillation)   Hyperlipidemia   Chronic diastolic CHF (congestive heart failure)   Pyrexia   Renal insufficiency   Arterial hypotension   A-fib    Time spent: 25 minutes    [facility] Pager [phone]. If 7PM-7AM, please contact night-coverage at [email or web address], password [email or web address] 04/16/2014, 6:29 PM  LOS: 0 days

## 2014-04-17 DIAGNOSIS — I5033 Acute on chronic diastolic (congestive) heart failure: Secondary | ICD-10-CM

## 2014-04-17 DIAGNOSIS — I472 Ventricular tachycardia: Secondary | ICD-10-CM

## 2014-04-17 DIAGNOSIS — I4581 Long QT syndrome: Secondary | ICD-10-CM

## 2014-04-17 LAB — BASIC METABOLIC PANEL
ANION GAP: 6 (ref 5–15)
Anion gap: 9 (ref 5–15)
BUN: 18 mg/dL (ref 6–23)
BUN: 20 mg/dL (ref 6–23)
CHLORIDE: 95 mmol/L — AB (ref 96–112)
CO2: 32 mmol/L (ref 19–32)
CO2: 36 mmol/L — AB (ref 19–32)
CREATININE: 1.24 mg/dL — AB (ref 0.50–1.10)
Calcium: 9.2 mg/dL (ref 8.4–10.5)
Calcium: 9.3 mg/dL (ref 8.4–10.5)
Chloride: 93 mmol/L — ABNORMAL LOW (ref 96–112)
Creatinine, Ser: 1.24 mg/dL — ABNORMAL HIGH (ref 0.50–1.10)
GFR calc Af Amer: 49 mL/min — ABNORMAL LOW (ref >90)
GFR calc Af Amer: 49 mL/min — ABNORMAL LOW (ref >90)
GFR calc non Af Amer: 43 mL/min — ABNORMAL LOW (ref >90)
GFR calc non Af Amer: 43 mL/min — ABNORMAL LOW (ref >90)
Glucose, Bld: 192 mg/dL — ABNORMAL HIGH (ref 70–99)
Glucose, Bld: 203 mg/dL — ABNORMAL HIGH (ref 70–99)
POTASSIUM: 5 mmol/L (ref 3.5–5.1)
Potassium: 4.3 mmol/L (ref 3.5–5.1)
Sodium: 134 mmol/L — ABNORMAL LOW (ref 135–145)
Sodium: 137 mmol/L (ref 135–145)

## 2014-04-17 LAB — MAGNESIUM
Magnesium: 2 mg/dL (ref 1.5–2.5)
Magnesium: 2.1 mg/dL (ref 1.5–2.5)

## 2014-04-17 LAB — MRSA PCR SCREENING: MRSA by PCR: NEGATIVE

## 2014-04-17 LAB — GLUCOSE, CAPILLARY
Glucose-Capillary: 168 mg/dL — ABNORMAL HIGH (ref 70–99)
Glucose-Capillary: 190 mg/dL — ABNORMAL HIGH (ref 70–99)
Glucose-Capillary: 173 mg/dL — ABNORMAL HIGH (ref 70–99)
Glucose-Capillary: 178 mg/dL — ABNORMAL HIGH (ref 70–99)

## 2014-04-17 MED ORDER — TORSEMIDE 20 MG PO TABS
40.0000 mg | ORAL_TABLET | Freq: Every day | ORAL | Status: DC
  Administered 2014-04-17 – 2014-04-19 (×3): 40 mg via ORAL
  Filled 2014-04-17 (×4): qty 2

## 2014-04-17 NOTE — Progress Notes (Signed)
[facility] PROGRESS NOTE  [name] [id number] DOB: 1940-06-21 DOA: 04/12/2014 PCP: Octavio Graves, DO Interim summary: 74 year old lady admitted for fever, myalgia,s and generalized weakness. She was tested positive for flu , tamiflu started but patient didn't want it , as it was making her sick on her stomach. We stopped the tamiflu. She was started on torsemide for diuresis by cardiology.  Assessment/Plan: 1. Generalized weakness probably from hypotension from hypovolemia: Resolved with gentle hydration and fluid were stopped as she was developing pedal edema.  Her BP parameters have improved.   Elevated troponin's: Probably from demand ischemia from hypotension. She denies any chest pain. No further work up as per cardiology.    Diabetes mellitus: CBG (last 3)   Recent Labs  04/16/14 2136 04/17/14 0746 04/17/14 1128  GLUCAP 212* 168* 190*   Her CBG'S were running low on the day of admission and all oral meds and lantus  have been discontinued. She has her appettite back and her cbgs are slowly increasing. Resumed her lantus on 3/19 and would slowly increase the dose to the home dose . currently the lantus dose to 25 units BID. Add 2 units of premeal coverage.  Watch her onthis regimen for 24 more hours.   Acute on Chronic diastolic heart failure: Appears slightly fluid overload, with pedal edema. She was restarted back on the torsemide 40 mg daily.  Daily weights and strict intake and output monitor.   Urine output is 1500  yesterday.  She reports feeling better. Her ankle edema is better but her renal function is worsened a little. Continue to monitor her electrolyes and renal parameters on torsemide.   Influenza B PCR positive.  BLood cultures not done on admission.  UA does not show any infection.  CXR does not show any pneumonia.  She received 4 doses of zithromax  Started on tamiflu but patient refused it as it was making her sick on her stomach and  we have stopped it.    Hypothyroidism: Resume synthroid.   Chronic atrial fibrillation: Went in to AFIB with RVR, on 3/18 night , and she was started on cardizem IV , as her rate improved, she was transitioned to po cardizem on the morning of 3/19.she had a few beats of V tach and her cardizem was changed to metoprolol.  During the night patient had few urins of torsades , QT prolongation and she was transferred to step down. Her tikosyn was discontinued and resumed her anticoagulation.  Her potassium and magnesium were replaced. Her BB was also stopped for few pauses on telemetry monitoring.  Recheck EKG today and repeat potassium and magnesium in am.    Anemia; Normocytic.anemia panel shows low iron levels. And ferritin . Ferrous sulfate ordered.  Stool for occult blood still pending.  Monitor.    Hypokalemia: replaced as needed.     Acute on chronic renal failure: Probably from diuresis. Better today.    Code Status: full code.  Family Communication:none at bedside.  Disposition Plan: home when she has improved.    Consultants:  cardiology  Procedures:  none  Antibiotics: z pack completed.  HPI/Subjective: No new complaints.   Objective: Filed Vitals:   04/17/14 1200  BP: 113/58  Pulse: 70  Temp: 97.7 F (36.5 C)  Resp: 18    Intake/Output Summary (Last 24 hours) at 04/17/14 1706 Last data filed at 04/17/14 1600  Gross per 24 hour  Intake    360 ml  Output  2250 ml  Net  -1890 ml   Filed Weights   04/15/14 0541 04/16/14 0658 04/17/14 0407  Weight: 100.5 kg (221 lb 9 oz) 98.657 kg (217 lb 8 oz) 99.292 kg (218 lb 14.4 oz)    Exam:   General:  Alert afebrile comfortable on oxygen,   Cardiovascular: s1s2, no mrg, regular.   Respiratory: no wheezing, no rhonchi, air entry fair.   Abdomen: soft non tender non distended bowel sounds heard  Musculoskeletal: 1+ edema.   Data Reviewed: Basic Metabolic Panel:  Recent Labs Lab 04/14/14 0355  04/15/14 0541 04/16/14 0526 04/17/14 0004 04/17/14 0500  NA 135 137 137 134* 137  K 3.9 4.1 3.9 4.3 5.0  CL 95* 93* 93* 93* 95*  CO2 31 34* 34* 32 36*  GLUCOSE 155* 226* 234* 203* 192*  BUN 14 16 17 20 18   CREATININE 0.96 1.14* 1.08 1.24* 1.24*  CALCIUM 8.9 9.1 9.4 9.2 9.3  MG 1.7  --  1.8 2.0 2.1   Liver Function Tests:  Recent Labs Lab 04/13/14 0315  AST 85*  ALT 23  ALKPHOS 45  BILITOT 0.6  PROT 6.6  ALBUMIN 3.5   No results for input(s): LIPASE, AMYLASE in the last 168 hours. No results for input(s): AMMONIA in the last 168 hours. CBC:  Recent Labs Lab 04/12/14 1407 04/13/14 0315 04/16/14 0526  WBC 5.3 5.5 6.6  HGB 9.9* 9.2* 10.3*  HCT 34.3* 32.1* 35.7*  MCV 79.6 79.7 79.9  PLT 195 199 210   Cardiac Enzymes:  Recent Labs Lab 04/12/14 1407 04/12/14 1715 04/12/14 2250 04/13/14 0315 04/13/14 0908  TROPONINI 0.07* 0.07* 0.06* 0.05* 0.04*   BNP (last 3 results)  Recent Labs  03/19/14 1537 04/11/14 1245 04/12/14 1407  BNP 87.3  85.7 422.0* 447.7*    ProBNP (last 3 results)  Recent Labs  08/01/13 1940 08/03/13 1058 10/01/13 1355  PROBNP 607.1* 256.8* 169.5*    CBG:  Recent Labs Lab 04/16/14 1110 04/16/14 1637 04/16/14 2136 04/17/14 0746 04/17/14 1128  GLUCAP 234* 184* 212* 168* 190*    Recent Results (from the past 240 hour(s))  Urine culture     Status: None   Collection Time: 04/12/14  5:35 PM  Result Value Ref Range Status   Specimen Description URINE, CATHETERIZED  Final   Special Requests NONE  Final   Colony Count NO GROWTH Performed at [facility]   Final   Culture NO GROWTH Performed at [facility]   Final   Report Status 04/14/2014 FINAL  Final  MRSA PCR Screening     Status: None   Collection Time: 04/17/14  1:32 AM  Result Value Ref Range Status   MRSA by PCR NEGATIVE NEGATIVE Final    Comment:        The GeneXpert MRSA Assay (FDA approved for NASAL specimens only), is one component of  a comprehensive MRSA colonization surveillance program. It is not intended to diagnose MRSA infection nor to guide or monitor treatment for MRSA infections.      Studies: No results found.  Scheduled Meds: . antiseptic oral rinse  7 mL Mouth Rinse BID  . cholecalciferol  5,000 Units Oral Daily  . docusate sodium  100 mg Oral BID  . estradiol  1 mg Oral Daily  . ferrous sulfate  325 mg Oral Q breakfast  . folic acid  1 mg Oral Daily  . insulin aspart  0-9 Units Subcutaneous TID WC  . insulin aspart  2  Units Subcutaneous TID WC  . insulin glargine  25 Units Subcutaneous BID  . levothyroxine  137 mcg Oral QAC breakfast  . magnesium oxide  400 mg Oral TID AC & HS  . multivitamin with minerals  1 tablet Oral Daily  . pantoprazole  40 mg Oral Daily  . potassium chloride  20 mEq Oral Daily  . rivaroxaban  20 mg Oral QAC supper  . simvastatin  40 mg Oral QPM  . sodium chloride  3 mL Intravenous Q12H  . thiamine  100 mg Oral Daily  . torsemide  40 mg Oral Daily   Continuous Infusions:    Active Problems:   Hypothyroidism   Hypotension   PAF (paroxysmal atrial fibrillation)   Hyperlipidemia   Chronic diastolic CHF (congestive heart failure)   Pyrexia   Renal insufficiency   Arterial hypotension   A-fib    Time spent: 25 minutes    [facility] Pager [phone]. If 7PM-7AM, please contact night-coverage at [email or web address], password [facility] 04/17/2014, 5:06 PM  LOS: 1 day

## 2014-04-17 NOTE — Progress Notes (Signed)
Shift event note:  Notified by cardiology w/ recommendation to transfer pt to SDU. He was notified of pt's frequent runs of VT w/ pauses and c/o CP. He reviewed rhythm strips and believes pt to be having runs of Torsades likely related to several meds pt currently on w/ side effect of QT prolongation. He has d/c'd Zithromax, Zofran and Tikosyn. Request alternative antibiotic if indicated. Also request repeat Bmet and Mg level. Recommends to keep  K+ > 4 and Mg > 2. Order was placed for transfer to SDU. Pt only had one remaining dose left of Zithromax which was ordered to cover Bronchitis. Will not add additional antibiotic at this time. Currently at bedside pt noted resting in NAD. She denies CP or other c/o's and telemetry reveals NSR w/ occasional PVC's. BBS CTA. Current VSS. Discussed change in pt's status at bedside w/ daughter and (ex) husband. Will continue to monitor closely in SDU.  Jeryl Columbia, NP-C [facility] Pager [phone]

## 2014-04-17 NOTE — Progress Notes (Signed)
Cardiology on call was notified of pt frequent Vtach and pauses pt had c/o of nausea and given 4mg  IV Zofran as ordered she also stated mid sternal pain 5/10 EKG was done as ordered and patient was transferred to 3W23. Pt daughter was notified of this change in her condition and room change. Report was given to [facility] and CMT was informed of transfer. Arthor Captain LPN

## 2014-04-17 NOTE — Progress Notes (Signed)
Patient Name: [name] Date of Encounter: 04/17/2014     Active Problems:   Hypothyroidism   Hypotension   PAF (paroxysmal atrial fibrillation)   Hyperlipidemia   Chronic diastolic CHF (congestive heart failure)   Pyrexia   Renal insufficiency   Arterial hypotension   A-fib    SUBJECTIVE  Feeling much better. Still with some puffiness in her feet. Dyspnea much improved.   CURRENT MEDS . antiseptic oral rinse  7 mL Mouth Rinse BID  . cholecalciferol  5,000 Units Oral Daily  . docusate sodium  100 mg Oral BID  . estradiol  1 mg Oral Daily  . ferrous sulfate  325 mg Oral Q breakfast  . folic acid  1 mg Oral Daily  . insulin aspart  0-9 Units Subcutaneous TID WC  . insulin aspart  2 Units Subcutaneous TID WC  . insulin glargine  25 Units Subcutaneous BID  . levothyroxine  137 mcg Oral QAC breakfast  . magnesium oxide  400 mg Oral TID AC & HS  . multivitamin with minerals  1 tablet Oral Daily  . pantoprazole  40 mg Oral Daily  . potassium chloride  20 mEq Oral Daily  . rivaroxaban  20 mg Oral QAC supper  . simvastatin  40 mg Oral QPM  . sodium chloride  3 mL Intravenous Q12H  . thiamine  100 mg Oral Daily  . torsemide  20 mg Oral Daily    OBJECTIVE  Filed Vitals:   04/17/14 0100 04/17/14 0400 04/17/14 0407 04/17/14 0800  BP:   103/62 149/61  Pulse:   54 58  Temp:  97.7 F (36.5 C)  98.2 F (36.8 C)  TempSrc:  Oral  Oral  Resp:   19 18  Height:      Weight:   218 lb 14.4 oz (99.292 kg)   SpO2: 94%  99% 96%    Intake/Output Summary (Last 24 hours) at 04/17/14 0857 Last data filed at 04/17/14 0000  Gross per 24 hour  Intake   1080 ml  Output    800 ml  Net    280 ml   Filed Weights   04/15/14 0541 04/16/14 0658 04/17/14 0407  Weight: 221 lb 9 oz (100.5 kg) 217 lb 8 oz (98.657 kg) 218 lb 14.4 oz (99.292 kg)    PHYSICAL EXAM  General: Pleasant, NAD. Neuro: Alert and oriented X 3. Moves all extremities spontaneously. Psych: Normal  affect. HEENT:  Normal  Neck: Supple without bruits or JVD. Lungs:  Resp regular and unlabored, CTA. Heart: brady. no s3, s4, or murmurs. Abdomen: Soft, non-tender, non-distended, BS + x 4.  Extremities: No clubbing, cyanosis or 1+ ptting edema bilaterally in her feet. Trace DP/PT/Radials 2+ and equal bilaterally  Accessory Clinical Findings  CBC  Recent Labs  04/16/14 0526  WBC 6.6  HGB 10.3*  HCT 35.7*  MCV 79.9  PLT [id number]   Basic Metabolic Panel  Recent Labs  04/17/14 0004 04/17/14 0500  NA 134* 137  K 4.3 5.0  CL 93* 95*  CO2 32 36*  GLUCOSE 203* 192*  BUN 20 18  CREATININE 1.24* 1.24*  CALCIUM 9.2 9.3  MG 2.0 2.1    TELE  Now in sinus brady. Review of tele reveals short runs of NSVT.  Radiology/Studies  Dg Chest 2 View  04/12/2014   CLINICAL DATA:  Short of breath.  Dizziness, chest pain, fever  EXAM: CHEST  2 VIEW  COMPARISON:  03/19/2014  FINDINGS: Cardiac  enlargement. Cardiac loop recorder noted. Negative for heart failure. No edema or effusion. Negative for pneumonia or mass lesion. Minimal scarring in the lingula is unchanged.  IMPRESSION: No active cardiopulmonary disease.   Electronically Signed   By: Franchot Gallo M.D.   On: 04/12/2014 15:26   X-ray Chest Pa And Lateral  03/19/2014   CLINICAL DATA:  Chest pain for 2 days.  Dyspnea.  EXAM: CHEST  2 VIEW  COMPARISON:  None.  FINDINGS: Cardiac silhouette mildly enlarged. No mediastinal or hilar masses or evidence of adenopathy.  Clear lungs.  No pleural effusion or pneumothorax.  Bony thorax is demineralized but grossly intact.  IMPRESSION: No acute cardiopulmonary disease.   Electronically Signed   By: Lajean Manes M.D.   On: 03/19/2014 17:19    ASSESSMENT AND PLAN  [name] is a 74 yo with HTN, DM , PAF (on Tikosyn and Xarelto), chronic systolic/diastolic CHF (LVEF 45 to A999333 in 12/14) nonobstructive CAD (Cath 03/21/14: RA1- RV46/14, PA 43/9; PCWP 17. LAD 40to 50% mid; LX 50% mid; RCA 40% ostial; LVEF  50 to 55%) who was admitted to [name] on 04/12/14 for volume overload and feeling poorly. She tested positive for influenza B.  Torsades- On call fellow paged last night. It appears she is having pauses (none too long) and short runs of torsades, felt to be related to combined use of azithromycin, zofran, and dofetilide. He discontinued these medications as well as the metoprolol as TdP is pause dependent. -- K is 5 and Mag is 2.1. Continue to monitor closely.  Acute on Chronic diastolic HF -- Felt to be mildly volume overloaded still yesterday and resumed on Torsemide 20mg  qd.  -- Weight down 223--> 217 lbs. -- Net neg 1.1L. Still with some puffiness in her feet. Will increase this 40 qd.   PAF -- Was in atrial fibrillation with RVR upon admission. She has actually converted into sinus brady this AM -- Continue Xarelto -- Diltiazem discontinued yesterday and metoprolol restarted. However, now metoprolol and tikosyn discontinued due to pauses on tele and short runs of torsades de pointe. ECG reveals QTc 559 on 04/16/14. Will hold Tikosyn until QT normalizes. A little bradycardic. Will continue to hold metoprolol and likely resume tomorrow.   Influenza B +  -- as per IM  Hypothyroidism  Hypotension  Hyperlipidemia  Pyrexia  Renal insufficiency      Signed, Allison Stanford PA-C  Pager [phone]  Patient seen and examined and history reviewed. Agree with above findings and plan. She developed recurrent episodes of NSVT starting at 5 pm yesterday. Still in NSR but QTc prolonged to 559 msec. She was receiving Zithromax and IV Zofran. These meds and Tikosyn are now on hold. Electrolytes are OK.  I/O positive yesterday. Will increase Torsemide to 40 mg daily. Follow BMET. Continue Xarelto. Will check Ecg daily. Once QT normalizes will need to decide whether it is safe to resume Tikosyn at a lower dose. I discussed with Dr. Rayann [name] and he will review with [place] tomorrow. Will hold metoprolol  today but plan to resume in am.    [name], [name] 04/17/2014 9:56 AM

## 2014-04-18 ENCOUNTER — Encounter (HOSPITAL_COMMUNITY): Payer: Self-pay | Admitting: Nurse Practitioner

## 2014-04-18 DIAGNOSIS — J1189 Influenza due to unidentified influenza virus with other manifestations: Secondary | ICD-10-CM

## 2014-04-18 DIAGNOSIS — I482 Chronic atrial fibrillation: Secondary | ICD-10-CM

## 2014-04-18 DIAGNOSIS — J111 Influenza due to unidentified influenza virus with other respiratory manifestations: Secondary | ICD-10-CM

## 2014-04-18 HISTORY — DX: Influenza due to unidentified influenza virus with other respiratory manifestations: J11.1

## 2014-04-18 LAB — GLUCOSE, CAPILLARY
GLUCOSE-CAPILLARY: 202 mg/dL — AB (ref 70–99)
GLUCOSE-CAPILLARY: 157 mg/dL — AB (ref 70–99)
Glucose-Capillary: 186 mg/dL — ABNORMAL HIGH (ref 70–99)
Glucose-Capillary: 194 mg/dL — ABNORMAL HIGH (ref 70–99)

## 2014-04-18 LAB — BASIC METABOLIC PANEL
Anion gap: 12 (ref 5–15)
BUN: 16 mg/dL (ref 6–23)
CO2: 34 mmol/L — ABNORMAL HIGH (ref 19–32)
CREATININE: 1.23 mg/dL — AB (ref 0.50–1.10)
Calcium: 9.4 mg/dL (ref 8.4–10.5)
Chloride: 92 mmol/L — ABNORMAL LOW (ref 96–112)
GFR calc Af Amer: 50 mL/min — ABNORMAL LOW (ref >90)
GFR calc non Af Amer: 43 mL/min — ABNORMAL LOW (ref >90)
Glucose, Bld: 154 mg/dL — ABNORMAL HIGH (ref 70–99)
Potassium: 4.5 mmol/L (ref 3.5–5.1)
Sodium: 138 mmol/L (ref 135–145)

## 2014-04-18 MED ORDER — DILTIAZEM HCL ER COATED BEADS 240 MG PO CP24
240.0000 mg | ORAL_CAPSULE | Freq: Every day | ORAL | Status: DC
  Administered 2014-04-18 – 2014-04-19 (×2): 240 mg via ORAL
  Filled 2014-04-18 (×2): qty 1

## 2014-04-18 MED ORDER — DILTIAZEM HCL 100 MG IV SOLR
5.0000 mg/h | INTRAVENOUS | Status: DC
  Administered 2014-04-18: 5 mg/h via INTRAVENOUS

## 2014-04-18 NOTE — Progress Notes (Signed)
[facility] PROGRESS NOTE  [name] [id number] DOB: [birth year] DOA: 04/12/2014 PCP: Octavio Graves, DO  Assessment/Plan: 1. Acute on chronic diastolic CHF- improved with diuresis. Patient currently on torsemide 40 mg daily. Cardiology following 2. Chronic atrial fibrillation- patient went into A. fib with RVR on the night of 04/13/2014. Patient started on IV Cardizem due to QTC prolongation the Tikosyn was discontinued. Cardiology recommends to continue with diltiazem. The medications will be adjusted before discharge. Continue anticoagulation with Xarelto. 3. Influenza B PCR positive- patient was found to have positive PCR influenza B. Chest x-ray did not show pneumonia. Patient was started on Tamiflu but she refused to take it as it was making her sick on stomach. 4. Hypertension- blood pressure is stable 5. Diabetes mellitus- pressure was controlled, continue sliding scale insulin with NovoLog. 6. Hypothyroidism- continue Synthroid  Code Status: Full code Family Communication: *No family at bedside Disposition Plan: Home when stable   Consultants:  Cardiology  Procedures:  None  Antibiotics:  None  HPI/Subjective: 74 year old female who came with chief complaint of weakness, hypotension mild elevation of troponin. Patient found to be positive for influenza, she was started on Tamiflu, which was stopped as per patient request as it was making her sick, stomach. Patient started on torsemide for diuresis per cardiology. This morning patient is concerned about restarting tic were sitting for A. Fib. Cardiology to follow and make recommendations today  Objective: Filed Vitals:   04/18/14 1209  BP: 111/95  Pulse: 97  Temp:   Resp: 15    Intake/Output Summary (Last 24 hours) at 04/18/14 1713 Last data filed at 04/18/14 1400  Gross per 24 hour  Intake    840 ml  Output   3400 ml  Net  -2560 ml   Filed Weights   04/16/14 0658 04/17/14 0407 04/18/14 0500   Weight: 98.657 kg (217 lb 8 oz) 99.292 kg (218 lb 14.4 oz) 95.21 kg (209 lb 14.4 oz)    Exam:   General:  Appears in no acute distress  Cardiovascular: S1-S2 irregular  Respiratory: Clear to auscultation bilaterally  Abdomen: Soft, nontender no organomegaly  Musculoskeletal: *No edema noted of the lower extremities  Data Reviewed: Basic Metabolic Panel:  Recent Labs Lab 04/14/14 0355 04/15/14 0541 04/16/14 0526 04/17/14 0004 04/17/14 0500 04/18/14 0318  NA 135 137 137 134* 137 138  K 3.9 4.1 3.9 4.3 5.0 4.5  CL 95* 93* 93* 93* 95* 92*  CO2 31 34* 34* 32 36* 34*  GLUCOSE 155* 226* 234* 203* 192* 154*  BUN 14 16 17 20 18 16   CREATININE 0.96 1.14* 1.08 1.24* 1.24* 1.23*  CALCIUM 8.9 9.1 9.4 9.2 9.3 9.4  MG 1.7  --  1.8 2.0 2.1  --    Liver Function Tests:  Recent Labs Lab 04/13/14 0315  AST 85*  ALT 23  ALKPHOS 45  BILITOT 0.6  PROT 6.6  ALBUMIN 3.5   No results for input(s): LIPASE, AMYLASE in the last 168 hours. No results for input(s): AMMONIA in the last 168 hours. CBC:  Recent Labs Lab 04/12/14 1407 04/13/14 0315 04/16/14 0526  WBC 5.3 5.5 6.6  HGB 9.9* 9.2* 10.3*  HCT 34.3* 32.1* 35.7*  MCV 79.6 79.7 79.9  PLT 195 199 210   Cardiac Enzymes:  Recent Labs Lab 04/12/14 1407 04/12/14 1715 04/12/14 2250 04/13/14 0315 04/13/14 0908  TROPONINI 0.07* 0.07* 0.06* 0.05* 0.04*   BNP (last 3 results)  Recent Labs  03/19/14 1537 04/11/14 1245 04/12/14  1407  BNP 87.3  85.7 422.0* 447.7*    ProBNP (last 3 results)  Recent Labs  08/01/13 1940 08/03/13 1058 10/01/13 1355  PROBNP 607.1* 256.8* 169.5*    CBG:  Recent Labs Lab 04/17/14 1128 04/17/14 1704 04/17/14 1953 04/18/14 0730 04/18/14 1154  GLUCAP 190* 173* 178* 186* 202*    Recent Results (from the past 240 hour(s))  Urine culture     Status: None   Collection Time: 04/12/14  5:35 PM  Result Value Ref Range Status   Specimen Description URINE, CATHETERIZED  Final    Special Requests NONE  Final   Colony Count NO GROWTH Performed at [facility]   Final   Culture NO GROWTH Performed at [facility]   Final   Report Status 04/14/2014 FINAL  Final  MRSA PCR Screening     Status: None   Collection Time: 04/17/14  1:32 AM  Result Value Ref Range Status   MRSA by PCR NEGATIVE NEGATIVE Final    Comment:        The GeneXpert MRSA Assay (FDA approved for NASAL specimens only), is one component of a comprehensive MRSA colonization surveillance program. It is not intended to diagnose MRSA infection nor to guide or monitor treatment for MRSA infections.      Studies: No results found.  Scheduled Meds: . antiseptic oral rinse  7 mL Mouth Rinse BID  . cholecalciferol  5,000 Units Oral Daily  . diltiazem  240 mg Oral Daily  . docusate sodium  100 mg Oral BID  . estradiol  1 mg Oral Daily  . ferrous sulfate  325 mg Oral Q breakfast  . folic acid  1 mg Oral Daily  . insulin aspart  0-9 Units Subcutaneous TID WC  . insulin aspart  2 Units Subcutaneous TID WC  . insulin glargine  25 Units Subcutaneous BID  . levothyroxine  137 mcg Oral QAC breakfast  . magnesium oxide  400 mg Oral TID AC & HS  . multivitamin with minerals  1 tablet Oral Daily  . pantoprazole  40 mg Oral Daily  . potassium chloride  20 mEq Oral Daily  . rivaroxaban  20 mg Oral QAC supper  . simvastatin  40 mg Oral QPM  . sodium chloride  3 mL Intravenous Q12H  . thiamine  100 mg Oral Daily  . torsemide  40 mg Oral Daily   Continuous Infusions:   Active Problems:   Hypothyroidism   Hypotension   PAF (paroxysmal atrial fibrillation)   Hyperlipidemia   Chronic diastolic CHF (congestive heart failure)   Pyrexia   Renal insufficiency   Arterial hypotension   A-fib    Time spent: *25 min    [facility]  [facility] Pager [phone]*. If 7PM-7AM, please contact night-coverage at [email or web address], password [facility] 04/18/2014, 5:13 PM  LOS: 2 days

## 2014-04-18 NOTE — Progress Notes (Signed)
A-fib/RVR on telemetry with HR 110s-140s.  Pt asymptomatic.  VSS except elevated HR.  Dr. Radford Pax notified.  Orders received.  Cardizem gtt started at 5 mg/hr per MD order.  Will continue to monitor.  Jodell Cipro
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
## 2014-04-19 ENCOUNTER — Ambulatory Visit (INDEPENDENT_AMBULATORY_CARE_PROVIDER_SITE_OTHER): Payer: Medicare HMO | Admitting: *Deleted

## 2014-04-19 ENCOUNTER — Ambulatory Visit: Payer: Medicare HMO | Admitting: Cardiology

## 2014-04-19 DIAGNOSIS — R55 Syncope and collapse: Secondary | ICD-10-CM | POA: Diagnosis not present

## 2014-04-19 LAB — BASIC METABOLIC PANEL
Anion gap: 13 (ref 5–15)
BUN: 18 mg/dL (ref 6–23)
CO2: 34 mmol/L — ABNORMAL HIGH (ref 19–32)
Calcium: 9.7 mg/dL (ref 8.4–10.5)
Chloride: 91 mmol/L — ABNORMAL LOW (ref 96–112)
Creatinine, Ser: 1.21 mg/dL — ABNORMAL HIGH (ref 0.50–1.10)
GFR, EST AFRICAN AMERICAN: 51 mL/min — AB (ref >90)
GFR, EST NON AFRICAN AMERICAN: 44 mL/min — AB (ref >90)
Glucose, Bld: 196 mg/dL — ABNORMAL HIGH (ref 70–99)
Potassium: 3.6 mmol/L (ref 3.5–5.1)
SODIUM: 138 mmol/L (ref 135–145)

## 2014-04-19 LAB — GLUCOSE, CAPILLARY
GLUCOSE-CAPILLARY: 160 mg/dL — AB (ref 70–99)
Glucose-Capillary: 192 mg/dL — ABNORMAL HIGH (ref 70–99)

## 2014-04-19 LAB — CLOSTRIDIUM DIFFICILE BY PCR: CDIFFPCR: NEGATIVE

## 2014-04-19 MED ORDER — TORSEMIDE 20 MG PO TABS
40.0000 mg | ORAL_TABLET | Freq: Every day | ORAL | Status: DC
Start: 2014-04-19 — End: 2014-05-11

## 2014-04-19 MED ORDER — DILTIAZEM HCL ER COATED BEADS 240 MG PO CP24: 240.0000 mg | ORAL_CAPSULE | Freq: Every day | ORAL | Status: DC

## 2014-04-19 MED ORDER — POTASSIUM CHLORIDE CRYS ER 20 MEQ PO TBCR
40.0000 meq | EXTENDED_RELEASE_TABLET | Freq: Once | ORAL | Status: AC
  Administered 2014-04-19: 40 meq via ORAL
  Filled 2014-04-19: qty 2

## 2014-04-19 NOTE — Progress Notes (Signed)
Pt is ready for DC home accompanied by daughter. Pt reports she understands all DC medications, follow up appointments, and DC instructions.   Prescilla Sours, Therapist, sports

## 2014-04-19 NOTE — Progress Notes (Signed)
SUBJECTIVE: The patient is doing well today.  At this time, she denies chest pain, shortness of breath, or any new concerns.  Marland Kitchen antiseptic oral rinse  7 mL Mouth Rinse BID  . cholecalciferol  5,000 Units Oral Daily  . diltiazem  240 mg Oral Daily  . docusate sodium  100 mg Oral BID  . estradiol  1 mg Oral Daily  . ferrous sulfate  325 mg Oral Q breakfast  . folic acid  1 mg Oral Daily  . insulin aspart  0-9 Units Subcutaneous TID WC  . insulin aspart  2 Units Subcutaneous TID WC  . insulin glargine  25 Units Subcutaneous BID  . levothyroxine  137 mcg Oral QAC breakfast  . magnesium oxide  400 mg Oral TID AC & HS  . multivitamin with minerals  1 tablet Oral Daily  . pantoprazole  40 mg Oral Daily  . potassium chloride  20 mEq Oral Daily  . rivaroxaban  20 mg Oral QAC supper  . simvastatin  40 mg Oral QPM  . sodium chloride  3 mL Intravenous Q12H  . thiamine  100 mg Oral Daily  . torsemide  40 mg Oral Daily      OBJECTIVE: Physical Exam: Filed Vitals:   04/18/14 1924 04/18/14 2057 04/19/14 0003 04/19/14 0500  BP: 109/77 105/70 117/59 111/68  Pulse: 82 76 58 60  Temp:  98 F (36.7 C) 97.4 F (36.3 C) 98.1 F (36.7 C)  TempSrc:  Oral Oral Oral  Resp: 18 21 16 21   Height:    5\' 2"  (1.575 m)  Weight:    210 lb 12.8 oz (95.618 kg)  SpO2: 97% 95% 96% 96%    Intake/Output Summary (Last 24 hours) at 04/19/14 [id number] Last data filed at 04/19/14 0518  Gross per 24 hour  Intake    360 ml  Output   3000 ml  Net  -2640 ml    Telemetry reveals sinus rhythm today  GEN- The patient is well appearing, alert and oriented x 3 today.   Head- normocephalic, atraumatic Eyes-  Sclera clear, conjunctiva pink Ears- hearing intact Oropharynx- clear Neck- supple, no JVP Lymph- no cervical lymphadenopathy Lungs- Clear to ausculation bilaterally, normal work of breathing Heart- Regular rate and rhythm, no murmurs, rubs or gallops, PMI not laterally displaced GI- soft, NT, ND, +  BS Extremities- no clubbing, cyanosis, or edema Neuro- strength and sensation are intact  LABS: Basic Metabolic Panel:  Recent Labs  04/17/14 0004 04/17/14 0500 04/18/14 0318 04/19/14 0615  NA 134* 137 138 138  K 4.3 5.0 4.5 3.6  CL 93* 95* 92* 91*  CO2 32 36* 34* 34*  GLUCOSE 203* 192* 154* 196*  BUN 20 18 16 18   CREATININE 1.24* 1.24* 1.23* 1.21*  CALCIUM 9.2 9.3 9.4 9.7  MG 2.0 2.1  --   --     ASSESSMENT AND PLAN:  Active Problems:   Hypothyroidism   Hypotension   PAF (paroxysmal atrial fibrillation)   Hyperlipidemia   Chronic diastolic CHF (congestive heart failure)   Pyrexia   Renal insufficiency   Arterial hypotension   A-fib   Influenza with respiratory manifestations   1.  Persistent atrial fibrillation With QTc prolongation and subsequent Torsades, would not resume Tikosyn Rate control with Diltiazem 240mg  daily  Will plan follow up in AF clinic in 1 week for further discussion about AAD therapy/management of AF Continue xarelto for [facility] of at least 4  2.  Acute on chronic  diastolic heart failure I/O -4.2L this admission Follow up with AHF clinic at discharge  3.  HTN Stable No change required today  4.  Influenza B Per primary team  Electrophysiology team to see as needed while here. Please call with questions. Can discharge from my standpoint  [name] MD, [facility] 04/19/2014 9:07 AM

## 2014-04-19 NOTE — Progress Notes (Signed)
Converted to NSR with HR in the 60s.  [name]

## 2014-04-19 NOTE — Discharge Summary (Signed)
Physician Discharge Summary  [name] [id number] DOB: 27-Nov-1940 DOA: 04/12/2014  PCP: Octavio Graves, DO  Admit date: 04/12/2014 Discharge date: 04/19/2014  Time spent: 50 minutes  Recommendations for Outpatient Follow-up:  1. *Follow up cardiology in one week 2.  Follow up PCP in 2 weeks Discharge Diagnoses:  Active Problems:   Hypothyroidism   Hypotension   PAF (paroxysmal atrial fibrillation)   Hyperlipidemia   Chronic diastolic CHF (congestive heart failure)   Pyrexia   Renal insufficiency   Arterial hypotension   A-fib   Influenza with respiratory manifestations   Discharge Condition: Stable  Diet recommendation: Low salt diet  Filed Weights   04/17/14 0407 04/18/14 0500 04/19/14 0500  Weight: 99.292 kg (218 lb 14.4 oz) 95.21 kg (209 lb 14.4 oz) 95.618 kg (210 lb 12.8 oz)    History of present illness:  74 y.o. female presents with weakness. She states that she felt that the fluid gathered in her body. She states that she went to the cardiologist yesterday and was told that she was about to drown in her fluid. She states that she received a diuretic for the fluid. After this she diuresed almost 2 liters. She stats that she was sent home with a new fluid pill in place of the lasix. Today she states that she felt weaker and weaker. She was having difficulty standing., She felt very light headed and also she states that she was getting chills and was feeling hot. She also did note a dry cough. Patient had some shortness of breath associated. She states that she had swelling of her legs which was increased. Patient states that she was recently started on oxygen at home also.  Hospital Course:  1. Acute on chronic diastolic CHF- improved with diuresis. Patient currently on torsemide 40 mg daily.  2. Chronic atrial fibrillation- patient went into A. fib with RVR on the night of 04/13/2014. Patient started on IV Cardizem due to QTC prolongation the Tikosyn was  discontinued. Cardiology recommends to continue with diltiazem. The medications will be adjusted before discharge. Continue anticoagulation with Xarelto. At this time she will be discharged on Cardizem 240 mg po daily, patient converted to NSR last night. 3. Influenza B PCR positive- patient was found to have positive PCR influenza B. Chest x-ray did not show pneumonia. Patient was started on Tamiflu but she refused to take it as it was making her sick on stomach. 4. Hypertension- blood pressure is stable. Will discontinue metoprolol, as as she did not get metoprolol in the hospital and BP is soft on cardizem. 5. Diabetes mellitus- pressure was controlled, continue sliding scale insulin with NovoLog. 6. Hypothyroidism- continue Synthroid 7. Diarrhea- resolved, stool for c diff was negative.   Procedures:  None  Consultations:  *Cardiology  Discharge Exam: Filed Vitals:   04/19/14 0500  BP: 111/68  Pulse: 60  Temp: 98.1 F (36.7 C)  Resp: 21    General: Appear in no acute distress Cardiovascular: s1s2  regular Respiratory: *Clear bilaterally  Discharge Instructions   Discharge Instructions    Diet - low sodium heart healthy    Complete by:  As directed      Increase activity slowly    Complete by:  As directed           Current Discharge Medication List    START taking these medications   Details  diltiazem (CARDIZEM CD) 240 MG 24 hr capsule Take 1 capsule (240 mg total) by mouth daily. Qty: 30  capsule, Refills: 2      CONTINUE these medications which have CHANGED   Details  torsemide (DEMADEX) 20 MG tablet Take 2 tablets (40 mg total) by mouth daily. Qty: 30 tablet, Refills: 2      CONTINUE these medications which have NOT CHANGED   Details  acetaminophen (TYLENOL) 500 MG tablet Take 1,000 mg by mouth daily as needed for moderate pain.     Cholecalciferol 5000 UNITS capsule Take 5,000 Units by mouth daily.    clonazePAM (KLONOPIN) 0.5 MG tablet Take  0.25-0.5 mg by mouth 3 (three) times daily as needed for anxiety.     estradiol (ESTRACE) 1 MG tablet Take 1 mg by mouth daily.    glipiZIDE (GLUCOTROL XL) 10 MG 24 hr tablet Take 10 mg by mouth daily with breakfast.    lansoprazole (PREVACID) 30 MG capsule Take 1 capsule (30 mg total) by mouth daily at 12 noon. Qty: 30 capsule, Refills: 11    levothyroxine (SYNTHROID, LEVOTHROID) 137 MCG tablet Take 137 mcg by mouth daily before breakfast.    magnesium oxide (MAG-OX) 400 MG tablet Take 400 mg by mouth 4 (four) times daily.     metFORMIN (GLUCOPHAGE-XR) 500 MG 24 hr tablet Take 1 tablet (500 mg total) by mouth 2 (two) times daily. Qty: 60 tablet, Refills: 11    potassium chloride (K-DUR) 10 MEQ tablet Take 1 tablet (10 mEq total) by mouth as directed. Increase to 50mEq twice daily for 3 days then return to 62mEq every other day. Qty: 30 tablet, Refills: 11   Associated Diagnoses: Paroxysmal atrial fibrillation    promethazine (PHENERGAN) 25 MG tablet Take 25 mg by mouth every 6 (six) hours as needed for nausea or vomiting.    simethicone (MYLICON) 0000000 MG chewable tablet Chew 125 mg by mouth every 6 (six) hours as needed for flatulence.    vitamin B-12 (CYANOCOBALAMIN) 1000 MCG tablet Take 1,000 mcg by mouth daily.    ACCU-CHEK SMARTVIEW test strip Refills: 11    diphenhydrAMINE (BENADRYL) 25 MG tablet Take 25 mg by mouth every 6 (six) hours as needed for itching or allergies.     Insulin Glargine (LANTUS) 100 UNIT/ML Solostar Pen Inject 35 Units into the skin 2 (two) times daily.     simvastatin (ZOCOR) 40 MG tablet Take 40 mg by mouth every evening.    traMADol (ULTRAM) 50 MG tablet Take 50-100 mg by mouth every 6 (six) hours as needed for moderate pain.     XARELTO 20 MG TABS tablet TAKE ONE (1) TABLET EACH DAY Qty: 30 tablet, Refills: 5      STOP taking these medications     dofetilide (TIKOSYN) 125 MCG capsule      metoprolol (LOPRESSOR) 50 MG tablet         Allergies  Allergen Reactions  . Adhesive [Tape] Itching, Swelling, Rash and Other (See Comments)    Tears skin and causes blisters also  . Blueberry Flavor Anaphylaxis  . Dicyclomine Nausea And Vomiting and Other (See Comments)    "Heart trouble"; Headaches and increased blood sugars  . Food Anaphylaxis and Other (See Comments)    Melons, Bananas, Cantaloupes, Watermelon-throat closes up and blisters   . Imdur [Isosorbide] Hives, Palpitations and Other (See Comments)    Headaches also  . Januvia [Sitagliptin] Shortness Of Breath  . Lipitor [Atorvastatin] Shortness Of Breath  . Losartan Potassium Shortness Of Breath  . Nitroglycerin Other (See Comments)    Caused cardiac arrest  . Penicillins  Anaphylaxis  . Prednisone Anaphylaxis  . Vancomycin Anaphylaxis  . Cetacaine [Butamben-Tetracaine-Benzocaine] Nausea And Vomiting and Swelling  . Diltiazem Nausea Only    Chest pain also  . Hydrocodone Hives  . Oxycodone Hives  . Avelox [Moxifloxacin] Swelling and Rash  . Cefprozil Other (See Comments)    REACTION: unknown reaction      The results of significant diagnostics from this hospitalization (including imaging, microbiology, ancillary and laboratory) are listed below for reference.    Significant Diagnostic Studies: Dg Chest 2 View  04/12/2014   CLINICAL DATA:  Short of breath.  Dizziness, chest pain, fever  EXAM: CHEST  2 VIEW  COMPARISON:  03/19/2014  FINDINGS: Cardiac enlargement. Cardiac loop recorder noted. Negative for heart failure. No edema or effusion. Negative for pneumonia or mass lesion. Minimal scarring in the lingula is unchanged.  IMPRESSION: No active cardiopulmonary disease.   Electronically Signed   By: Franchot Gallo M.D.   On: 04/12/2014 15:26    Microbiology: Recent Results (from the past 240 hour(s))  Urine culture     Status: None   Collection Time: 04/12/14  5:35 PM  Result Value Ref Range Status   Specimen Description URINE, CATHETERIZED  Final    Special Requests NONE  Final   Colony Count NO GROWTH Performed at [facility]   Final   Culture NO GROWTH Performed at [facility]   Final   Report Status 04/14/2014 FINAL  Final  MRSA PCR Screening     Status: None   Collection Time: 04/17/14  1:32 AM  Result Value Ref Range Status   MRSA by PCR NEGATIVE NEGATIVE Final    Comment:        The GeneXpert MRSA Assay (FDA approved for NASAL specimens only), is one component of a comprehensive MRSA colonization surveillance program. It is not intended to diagnose MRSA infection nor to guide or monitor treatment for MRSA infections.   Clostridium Difficile by PCR     Status: None   Collection Time: 04/19/14  8:19 AM  Result Value Ref Range Status   C difficile by pcr NEGATIVE NEGATIVE Final     Labs: Basic Metabolic Panel:  Recent Labs Lab 04/14/14 0355  04/16/14 0526 04/17/14 0004 04/17/14 0500 04/18/14 0318 04/19/14 0615  NA 135  < > 137 134* 137 138 138  K 3.9  < > 3.9 4.3 5.0 4.5 3.6  CL 95*  < > 93* 93* 95* 92* 91*  CO2 31  < > 34* 32 36* 34* 34*  GLUCOSE 155*  < > 234* 203* 192* 154* 196*  BUN 14  < > 17 20 18 16 18   CREATININE 0.96  < > 1.08 1.24* 1.24* 1.23* 1.21*  CALCIUM 8.9  < > 9.4 9.2 9.3 9.4 9.7  MG 1.7  --  1.8 2.0 2.1  --   --   < > = values in this interval not displayed. Liver Function Tests:  Recent Labs Lab 04/13/14 0315  AST 85*  ALT 23  ALKPHOS 45  BILITOT 0.6  PROT 6.6  ALBUMIN 3.5   No results for input(s): LIPASE, AMYLASE in the last 168 hours. No results for input(s): AMMONIA in the last 168 hours. CBC:  Recent Labs Lab 04/12/14 1407 04/13/14 0315 04/16/14 0526  WBC 5.3 5.5 6.6  HGB 9.9* 9.2* 10.3*  HCT 34.3* 32.1* 35.7*  MCV 79.6 79.7 79.9  PLT 195 199 210   Cardiac Enzymes:  Recent Labs Lab 04/12/14 1407  04/12/14 1715 04/12/14 2250 04/13/14 0315 04/13/14 0908  TROPONINI 0.07* 0.07* 0.06* 0.05* 0.04*   BNP: BNP (last 3 results)  Recent  Labs  03/19/14 1537 04/11/14 1245 04/12/14 1407  BNP 87.3  85.7 422.0* 447.7*    ProBNP (last 3 results)  Recent Labs  08/01/13 1940 08/03/13 1058 10/01/13 1355  PROBNP 607.1* 256.8* 169.5*    CBG:  Recent Labs Lab 04/18/14 0730 04/18/14 1154 04/18/14 1713 04/18/14 2059 04/19/14 0815  GLUCAP 186* 202* 157* 194* 192*       Signed:  Skylinn Vialpando S  [facility] 04/19/2014, 11:43 AM

## 2014-04-24 LAB — MDC_IDC_ENUM_SESS_TYPE_REMOTE

## 2014-04-24 NOTE — Progress Notes (Signed)
Loop recorder 

## 2014-04-25 ENCOUNTER — Encounter: Payer: Self-pay | Admitting: Internal Medicine

## 2014-04-25 ENCOUNTER — Encounter: Payer: Self-pay | Admitting: Nurse Practitioner

## 2014-04-25 ENCOUNTER — Other Ambulatory Visit: Payer: Self-pay | Admitting: *Deleted

## 2014-04-25 ENCOUNTER — Ambulatory Visit (INDEPENDENT_AMBULATORY_CARE_PROVIDER_SITE_OTHER): Payer: Medicare HMO | Admitting: Nurse Practitioner

## 2014-04-25 VITALS — BP 128/86 | HR 128 | Ht 62.0 in | Wt 208.2 lb

## 2014-04-25 DIAGNOSIS — I480 Paroxysmal atrial fibrillation: Secondary | ICD-10-CM

## 2014-04-25 MED ORDER — METOPROLOL TARTRATE 50 MG PO TABS: 25.0000 mg | ORAL_TABLET | Freq: Two times a day (BID) | ORAL | Status: DC
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
## 2014-04-25 NOTE — Patient Instructions (Signed)
Your physician recommends that you schedule a follow-up appointment in: 2 weeks with Afib Clinic and 2 weeks with Heart Failure Clinic   Your physician has recommended you make the following change in your medication:  1)Resume Metoprolol 25 (1/2tablet of 50mg ) twice a day

## 2014-04-25 NOTE — Telephone Encounter (Signed)
Patient daughter called requesting prescription be called in for automatic blood pressure cuff.  Called [facility] in [place] per daughter request and Aaron Edelman stated that insurance does not cover DME equipment through their store. Informed daughter of this --- she will call medical supply stores and see what they say if not she will purchase one at [facility] that Aaron Edelman stated was about $40.

## 2014-04-27 ENCOUNTER — Encounter: Payer: Self-pay | Admitting: Cardiology

## 2014-05-03 ENCOUNTER — Telehealth: Payer: Self-pay | Admitting: Internal Medicine

## 2014-05-03 NOTE — Telephone Encounter (Signed)
Returned call to patient she stated since she started on Diltiazem 240 mg daily she has been constipated.Stated she wanted to know what medication to take.Advised she can take Miralax for constipation.Also stated she received a letter form her insurance there is a interaction between simvastatin 40 mg and diltiazem 240 mg.Advised will speak to Dr.Jordan 05/04/14 and call her back.Marland Kitchen

## 2014-05-03 NOTE — Telephone Encounter (Signed)
New message     Pt c/o medication issue:  1. Name of Medication: diltiazem  2. How are you currently taking this medication (dosage and times per day)?240mg  daily  3. Are you having a reaction (difficulty breathing--STAT)?  4. What is your medication issue? Medication is making pt constipated.  Pt got a letter from [place] saying she should not take medication with simvastatin.  Please advise

## 2014-05-04 ENCOUNTER — Encounter: Payer: Self-pay | Admitting: Internal Medicine

## 2014-05-04 ENCOUNTER — Encounter: Payer: Self-pay | Admitting: Cardiology

## 2014-05-04 MED ORDER — SIMVASTATIN 20 MG PO TABS: 20.0000 mg | ORAL_TABLET | Freq: Every day | ORAL | Status: DC

## 2014-05-04 NOTE — Telephone Encounter (Signed)
Returned call to patient Dr.Jordan advised ok to take miralax for constipation.Advised to decrease simvastatin to 20 mg daily.Advised to keep appointment with Dr.Jordan 07/02/14 at 10:30 am.Advised to call sooner if needed.

## 2014-05-11 ENCOUNTER — Other Ambulatory Visit: Payer: Self-pay

## 2014-05-11 ENCOUNTER — Encounter (HOSPITAL_COMMUNITY): Payer: Self-pay | Admitting: Nurse Practitioner

## 2014-05-11 ENCOUNTER — Ambulatory Visit (HOSPITAL_BASED_OUTPATIENT_CLINIC_OR_DEPARTMENT_OTHER)
Admission: RE | Admit: 2014-05-11 | Discharge: 2014-05-11 | Disposition: A | Discharge status: Home / Self Care | Payer: Medicare HMO | Source: Ambulatory Visit | Attending: Cardiology | Admitting: Cardiology

## 2014-05-11 ENCOUNTER — Ambulatory Visit (HOSPITAL_COMMUNITY)
Admission: RE | Admit: 2014-05-11 | Discharge: 2014-05-11 | Disposition: A | Discharge status: Home / Self Care | Payer: Medicare HMO | Source: Ambulatory Visit | Attending: Nurse Practitioner | Admitting: Nurse Practitioner

## 2014-05-11 VITALS — BP 120/82 | HR 99 | Ht 62.0 in | Wt 214.4 lb

## 2014-05-11 VITALS — BP 120/82 | HR 99 | Wt 214.8 lb

## 2014-05-11 DIAGNOSIS — I4891 Unspecified atrial fibrillation: Secondary | ICD-10-CM | POA: Diagnosis not present

## 2014-05-11 DIAGNOSIS — I481 Persistent atrial fibrillation: Secondary | ICD-10-CM

## 2014-05-11 DIAGNOSIS — I480 Paroxysmal atrial fibrillation: Secondary | ICD-10-CM

## 2014-05-11 DIAGNOSIS — I2510 Atherosclerotic heart disease of native coronary artery without angina pectoris: Secondary | ICD-10-CM | POA: Insufficient documentation

## 2014-05-11 DIAGNOSIS — I4819 Other persistent atrial fibrillation: Secondary | ICD-10-CM

## 2014-05-11 DIAGNOSIS — I5032 Chronic diastolic (congestive) heart failure: Secondary | ICD-10-CM

## 2014-05-11 LAB — CBC
HCT: 38.1 % (ref 36.0–46.0)
HEMOGLOBIN: 11 g/dL — AB (ref 12.0–15.0)
MCH: 23.3 pg — AB (ref 26.0–34.0)
MCHC: 28.9 g/dL — ABNORMAL LOW (ref 30.0–36.0)
MCV: 80.7 fL (ref 78.0–100.0)
PLATELETS: 228 10*3/uL (ref 150–400)
RBC: 4.72 MIL/uL (ref 3.87–5.11)
RDW: 17.9 % — ABNORMAL HIGH (ref 11.5–15.5)
WBC: 9.2 10*3/uL (ref 4.0–10.5)

## 2014-05-11 LAB — BASIC METABOLIC PANEL
ANION GAP: 13 (ref 5–15)
BUN: 21 mg/dL (ref 6–23)
CO2: 30 mmol/L (ref 19–32)
Calcium: 9.7 mg/dL (ref 8.4–10.5)
Chloride: 97 mmol/L (ref 96–112)
Creatinine, Ser: 1.05 mg/dL (ref 0.50–1.10)
GFR, EST AFRICAN AMERICAN: 60 mL/min — AB (ref >90)
GFR, EST NON AFRICAN AMERICAN: 52 mL/min — AB (ref >90)
Glucose, Bld: 190 mg/dL — ABNORMAL HIGH (ref 70–99)
POTASSIUM: 3.7 mmol/L (ref 3.5–5.1)
Sodium: 140 mmol/L (ref 135–145)

## 2014-05-11 LAB — BRAIN NATRIURETIC PEPTIDE: B Natriuretic Peptide: 118.9 pg/mL — ABNORMAL HIGH (ref 0.0–100.0)

## 2014-05-11 MED ORDER — METOPROLOL TARTRATE 50 MG PO TABS: 50.0000 mg | ORAL_TABLET | Freq: Two times a day (BID) | ORAL | Status: DC

## 2014-05-11 MED ORDER — POTASSIUM CHLORIDE ER 10 MEQ PO TBCR
20.0000 meq | EXTENDED_RELEASE_TABLET | ORAL | Status: DC
Start: 2014-05-11 — End: 2014-07-19

## 2014-05-11 MED ORDER — TORSEMIDE 20 MG PO TABS: 40.0000 mg | ORAL_TABLET | Freq: Two times a day (BID) | ORAL | Status: DC

## 2014-05-11 NOTE — Patient Instructions (Signed)
Increase Torsemide to 40 mg (2 tabs) Twice daily   Increase Potassium to 20 meq (2 tabs) daily  Labs today  Labs in 10 days (bmet)  Your physician recommends that you schedule a follow-up appointment in: 3 weeks  Do the following things EVERYDAY: 1) Weigh yourself in the morning before breakfast. Write it down and keep it in a log. 2) Take your medicines as prescribed 3) Eat low salt foods-Limit salt (sodium) to 2000 mg per day.  4) Stay as active as you can everyday 5) Limit all fluids for the day to less than 2 liters
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
## 2014-05-12 NOTE — Addendum Note (Signed)
Encounter addended by: Melissa Montane, NT on: 05/12/2014 11:45 AM<BR>     Documentation filed: Charges VN

## 2014-05-12 NOTE — Addendum Note (Signed)
Encounter addended by: Melissa Montane, NT on: 05/12/2014 11:39 AM<BR>     Documentation filed: Charges VN

## 2014-05-13 DIAGNOSIS — I4891 Unspecified atrial fibrillation: Secondary | ICD-10-CM | POA: Insufficient documentation

## 2014-05-13 HISTORY — DX: Unspecified atrial fibrillation: I48.91

## 2014-05-13 NOTE — Progress Notes (Signed)
Patient ID: [name], female   DOB: [birth year], 74 y.o.   MRN: [id number] PCP: Octavio Graves, DO  HPI: [name] is a 74 y.o. female with a history of HTN, DM, PAF on Xarelto, chronic diastolic CHF, nonobstructive CAD, and multiple medicine allergies . Most recent cath 2014 was ok.   Admitted 03/20/14 due to a multitude of sx including CP and SOB,fluid overload. She was diuresed and placed on lasix 40 mg daily.  Discharge weight was 215 pounds.   Admitted again 3/16 with the flu and acute on chronic diastolic CHF in the setting of the flu.  She was diuresed and started on azithromycin.  Unfortunately, this led to QT prolongation and torsades.  Tikosyn was stopped.  She went into atrial fibrillation and remains in atrial fibrillation today.    Rate control is reasonable today.  She is short of breath after walking about 50 feet.  Generally does ok walking around the house.  Sleeps in her recliner (has done this long-term).  Rare atypical chest pain.  She does not feel palpitations.  Weight is down 12 lbs since last appointment.   Labs (3/16): K 3.6, creatinine 1.21  ECG: atrial fibrillation at 84, QTc 451 msec  - Echo 12/14 showed an EF of 45 to 50% with grade 1 diastolic dysfunction - Echo (7/15) with EF 55-60%.   RHC/LHC 03/21/2014 RA 10 RV 46/14 PA 43/9 mean 28 PCWP 17 LV 135/14 AO 128/59 mean 83 Oxygen saturations: PA 70 AO 96 Cardiac Output (Fick) 7.1  Cardiac Index (Fick) 3.6 Left anterior descending (LAD): Mild diffuse mid-LAD stenosis. There is moderate calcification and the mid-vessel has diffuse 40-50% stenosis. The diagonal are patent and the first diagonal is a large vessel.  Left circumflex (LCx): There is a large intermediate branch without stenosis. The AV circumflex has an eccentric 50% stenosis unchanged from the previous study when the patient had a negative FFR.  Right coronary artery (RCA): The right cusp is heavily calcified. The ostium of the  RCA has 40% stenosis without catheter dampening. The vessel is a large, dominant RCA without significant obstruction. The PDA and PLA branches are patent.  Left ventriculography: There is mild inferior hypokinesis and the LVEF is estimated at 50-55%  ROS: All systems negative except as listed in HPI, PMH and Problem List.  SH:  History   Social History  . Marital Status: Widowed    Spouse Name: N/A  . Number of Children: 2  . Years of Education: N/A   Occupational History  . Retired    Social History Main Topics  . Smoking status: Never Smoker   . Smokeless tobacco: Never Used  . Alcohol Use: No  . Drug Use: No  . Sexual Activity: No   Other Topics Concern  . Not on file   Social History Narrative   ** Merged History Encounter **       Divorced   3 children, 1 deceased    FH:  Family History  Problem Relation Age of Onset  . Heart attack Mother   . Diabetes Mother   . Colon cancer Father   . Esophageal cancer Father   . Kidney cancer Father   . Diabetes Father   . Ovarian cancer Sister   . Liver cancer Sister   . Breast cancer Sister   . Colon cancer Son   . Colon polyps Son   . Diabetes Sister   . Irritable bowel syndrome Sister   .  Myocarditis Brother   . Rectal cancer Neg Hx   . Stomach cancer Neg Hx     Past Medical History  Diagnosis Date  . Hypertensive heart disease   . Hyperlipidemia   . Hypothyroidism   . Obesity (BMI 30-39.9)   . GERD (gastroesophageal reflux disease)   . Degenerative disc disease, cervical   . Asthma   . Vitamin D deficiency   . Depression   . Bell's palsy   . Gastroparesis   . Internal hemorrhoid   . Hiatal hernia   . Anxiety   . Osteoarthritis   . Adenomatous colon polyp 02/13/09  . Diverticulosis   . Status post dilation of esophageal narrowing   . IBS (irritable bowel syndrome)   . PAF (paroxysmal atrial fibrillation)     a. chronic xarelto   . Diabetes mellitus without complication   . Diverticulitis   .  Syncope     a. 12/2012: MDT Reveal LINQ ILR placed;  b. 12/2012 Echo: EF 45-50%, Gr 3 DD, mild MR, mildly dil LA;  c. 12/2012 Carotid U/S: 1-39% bilat ICA stenosis.  Marland Kitchen CAD (coronary artery disease)     a. 12/2012 Nonobstructive by cath: LM nl, LAD 50p/m, LCX 50-87m (FFR 0.93), RCA min irregs, EF 55-65%-->Med Rx.  Marland Kitchen Neuropathy   . Atrial flutter     a. By ILR interrogation.  Marland Kitchen NICM (nonischemic cardiomyopathy)     a. EF 45% with grade 3 diastolic dysfunction by echo 12/2012.    Current Outpatient Prescriptions  Medication Sig Dispense Refill  . ACCU-CHEK SMARTVIEW test strip   11  . acetaminophen (TYLENOL) 500 MG tablet Take 1,000 mg by mouth daily as needed for moderate pain.     . Cholecalciferol 5000 UNITS capsule Take 5,000 Units by mouth daily.    . clonazePAM (KLONOPIN) 0.5 MG tablet Take 0.25-0.5 mg by mouth 3 (three) times daily as needed for anxiety.     Marland Kitchen diltiazem (CARDIZEM CD) 240 MG 24 hr capsule Take 1 capsule (240 mg total) by mouth daily. 30 capsule 2  . diphenhydrAMINE (BENADRYL) 25 MG tablet Take 25 mg by mouth every 6 (six) hours as needed for itching or allergies.     Marland Kitchen estradiol (ESTRACE) 1 MG tablet Take 1 mg by mouth daily.    Marland Kitchen glipiZIDE (GLUCOTROL XL) 10 MG 24 hr tablet Take 10 mg by mouth daily with breakfast.    . Insulin Glargine (LANTUS) 100 UNIT/ML Solostar Pen Inject 35 Units into the skin 2 (two) times daily.     . lansoprazole (PREVACID) 30 MG capsule Take 1 capsule (30 mg total) by mouth daily at 12 noon. 30 capsule 11  . levothyroxine (SYNTHROID, LEVOTHROID) 137 MCG tablet Take 137 mcg by mouth daily before breakfast.    . magnesium oxide (MAG-OX) 400 MG tablet Take 400 mg by mouth 4 (four) times daily.     . metFORMIN (GLUCOPHAGE-XR) 500 MG 24 hr tablet Take 1 tablet (500 mg total) by mouth 2 (two) times daily. 60 tablet 11  . metoprolol (LOPRESSOR) 50 MG tablet Take 1 tablet (50 mg total) by mouth 2 (two) times daily.    . potassium chloride (K-DUR) 10  MEQ tablet Take 2 tablets (20 mEq total) by mouth as directed. 30 tablet 11  . promethazine (PHENERGAN) 25 MG tablet Take 25 mg by mouth every 6 (six) hours as needed for nausea or vomiting.    . simethicone (MYLICON) 0000000 MG chewable tablet Chew 125 mg by  mouth every 6 (six) hours as needed for flatulence.    . simvastatin (ZOCOR) 20 MG tablet Take 10 mg by mouth daily.    Marland Kitchen torsemide (DEMADEX) 20 MG tablet Take 2 tablets (40 mg total) by mouth 2 (two) times daily. 120 tablet 3  . traMADol (ULTRAM) 50 MG tablet Take 50-100 mg by mouth every 6 (six) hours as needed for moderate pain.     . vitamin B-12 (CYANOCOBALAMIN) 1000 MCG tablet Take 1,000 mcg by mouth daily.    Alveda Reasons 20 MG TABS tablet TAKE ONE (1) TABLET EACH DAY 30 tablet 5   No current facility-administered medications for this encounter.    Filed Vitals:   05/11/14 1138  BP: 120/82  Pulse: 99  Weight: 214 lb 12.8 oz (97.433 kg)    PHYSICAL EXAM: General:  Chronically ill appearing. Arrived in wheelchair. No resp  difficulty HEENT: normal Neck: supple. JVP 8-9 cm with HJR. Carotids 2+ bilaterally; no bruits. No lymphadenopathy or thryomegaly appreciated. Cor: PMI normal. Irregular rate & rhythm. No rubs, gallops.  2/6 SEM RUSB.  Lungs: RML RLL LLL crackles. On 2 liters [place]  Abdomen: obese, soft, nontender, nondistended. No hepatosplenomegaly. No bruits or masses. Good bowel sounds. Extremities: no cyanosis, clubbing, rash, 1+ ankle edema.  Neuro: alert & orientedx3, cranial nerves grossly intact. Moves all 4 extremities w/o difficulty. Affect pleasant.  ASSESSMENT & PLAN:  1. Chronic diastolic CHF: EF 0000000 on 7/15 echo.  She remains at least mildly volume overloaded on exam.  NYHA class III symptoms.   - Increase torsemide to 40 mg bid and increase KCl to 20 daily.  BMET/BNP today and repeat BMET in 10 days.  - We discussed restricting her sodium and fluid intake.  2. Persistent atrial fibrillation: She is now off  Tikosyn due to prolonged QT and torsades (in setting of azithromycin use).  For now, would rate control and anticoagulate.  Rate is reasonable today on metoprolol and diltiazem CD.  Could consider re-attempting rhythm control with amiodarone.  I will leave this decision to Dr Rayann Heman who follows her closely.  Check CBC give Xarelto use.  3. Chronic respiratory failure: On 2 liters nasal cannula chronically.  Huriel Matt,MD 05/13/2014

## 2014-05-15 ENCOUNTER — Other Ambulatory Visit (HOSPITAL_COMMUNITY): Payer: Self-pay | Admitting: *Deleted

## 2014-05-15 ENCOUNTER — Telehealth (HOSPITAL_COMMUNITY): Payer: Self-pay | Admitting: *Deleted

## 2014-05-15 MED ORDER — TORSEMIDE 20 MG PO TABS: 40.0000 mg | ORAL_TABLET | Freq: Two times a day (BID) | ORAL | Status: DC

## 2014-05-15 NOTE — Telephone Encounter (Signed)
Pt requested i send Torsemide refill to the drug store in [place].  She is taking 2 tablets am and 2 tablets pm.  Refill sent.

## 2014-05-18 ENCOUNTER — Encounter: Payer: Self-pay | Admitting: Internal Medicine

## 2014-05-18 ENCOUNTER — Ambulatory Visit (INDEPENDENT_AMBULATORY_CARE_PROVIDER_SITE_OTHER): Payer: Medicare HMO | Admitting: *Deleted

## 2014-05-18 DIAGNOSIS — R55 Syncope and collapse: Secondary | ICD-10-CM | POA: Diagnosis not present

## 2014-05-21 ENCOUNTER — Other Ambulatory Visit: Payer: Self-pay

## 2014-05-21 MED ORDER — RIVAROXABAN 20 MG PO TABS: ORAL_TABLET | ORAL | Status: DC

## 2014-05-21 MED ORDER — LANSOPRAZOLE 30 MG PO CPDR: 30.0000 mg | DELAYED_RELEASE_CAPSULE | Freq: Every day | ORAL | Status: DC

## 2014-05-22 ENCOUNTER — Inpatient Hospital Stay (HOSPITAL_COMMUNITY): Admission: RE | Admit: 2014-05-22 | Payer: Medicare HMO | Source: Ambulatory Visit | Admitting: Nurse Practitioner

## 2014-05-23 NOTE — Progress Notes (Signed)
Loop recorder 

## 2014-05-30 ENCOUNTER — Encounter: Payer: Self-pay | Admitting: Internal Medicine

## 2014-05-31 ENCOUNTER — Encounter (HOSPITAL_COMMUNITY): Payer: Self-pay

## 2014-05-31 ENCOUNTER — Ambulatory Visit (HOSPITAL_COMMUNITY)
Admission: RE | Admit: 2014-05-31 | Discharge: 2014-05-31 | Disposition: A | Discharge status: Home / Self Care | Payer: Medicare HMO | Source: Ambulatory Visit | Attending: Internal Medicine | Admitting: Internal Medicine

## 2014-05-31 VITALS — BP 120/74 | HR 75 | Wt 213.1 lb

## 2014-05-31 DIAGNOSIS — I119 Hypertensive heart disease without heart failure: Secondary | ICD-10-CM | POA: Diagnosis not present

## 2014-05-31 DIAGNOSIS — F329 Major depressive disorder, single episode, unspecified: Secondary | ICD-10-CM | POA: Insufficient documentation

## 2014-05-31 DIAGNOSIS — I481 Persistent atrial fibrillation: Secondary | ICD-10-CM | POA: Insufficient documentation

## 2014-05-31 DIAGNOSIS — I5032 Chronic diastolic (congestive) heart failure: Secondary | ICD-10-CM

## 2014-05-31 DIAGNOSIS — Z7901 Long term (current) use of anticoagulants: Secondary | ICD-10-CM | POA: Diagnosis not present

## 2014-05-31 DIAGNOSIS — F419 Anxiety disorder, unspecified: Secondary | ICD-10-CM | POA: Diagnosis not present

## 2014-05-31 DIAGNOSIS — Z79899 Other long term (current) drug therapy: Secondary | ICD-10-CM | POA: Insufficient documentation

## 2014-05-31 DIAGNOSIS — K219 Gastro-esophageal reflux disease without esophagitis: Secondary | ICD-10-CM | POA: Insufficient documentation

## 2014-05-31 DIAGNOSIS — I482 Chronic atrial fibrillation, unspecified: Secondary | ICD-10-CM

## 2014-05-31 DIAGNOSIS — J9610 Chronic respiratory failure, unspecified whether with hypoxia or hypercapnia: Secondary | ICD-10-CM | POA: Insufficient documentation

## 2014-05-31 DIAGNOSIS — E669 Obesity, unspecified: Secondary | ICD-10-CM | POA: Insufficient documentation

## 2014-05-31 DIAGNOSIS — Z794 Long term (current) use of insulin: Secondary | ICD-10-CM | POA: Insufficient documentation

## 2014-05-31 DIAGNOSIS — I2510 Atherosclerotic heart disease of native coronary artery without angina pectoris: Secondary | ICD-10-CM | POA: Insufficient documentation

## 2014-05-31 DIAGNOSIS — E785 Hyperlipidemia, unspecified: Secondary | ICD-10-CM | POA: Diagnosis not present

## 2014-05-31 DIAGNOSIS — I480 Paroxysmal atrial fibrillation: Secondary | ICD-10-CM | POA: Diagnosis not present

## 2014-05-31 DIAGNOSIS — E039 Hypothyroidism, unspecified: Secondary | ICD-10-CM | POA: Diagnosis not present

## 2014-05-31 DIAGNOSIS — E119 Type 2 diabetes mellitus without complications: Secondary | ICD-10-CM | POA: Diagnosis not present

## 2014-05-31 NOTE — Addendum Note (Signed)
Encounter addended by: Effie Berkshire, RN on: 05/31/2014 12:58 PM<BR>     Documentation filed: Patient Instructions Section

## 2014-05-31 NOTE — Progress Notes (Signed)
Patient ID: [name], female   DOB: [birth year], 74 y.o.   MRN: [id number] PCP: Octavio Graves, DO  HPI: [name] is a 74 y.o. female with a history of HTN, DM, PAF on Xarelto, chronic diastolic CHF, nonobstructive CAD, and multiple medicine allergies . Most recent cath 2014 was ok.   Admitted 03/20/14 due to a multitude of sx including CP and SOB,fluid overload. She was diuresed and placed on lasix 40 mg daily.  Discharge weight was 215 pounds.   Admitted again 3/16 with the flu and acute on chronic diastolic CHF in the setting of the flu.  She was diuresed and started on azithromycin.  Unfortunately, this led to QT prolongation and torsades.  Tikosyn was stopped.  She went into atrial fibrillation and remains in atrial fibrillation.     Here for f/u: Continues to drink a lot of fluid because she says she is always thirsty or hungry. Weight stable at home. Down a pound here. Doesn't use extra demadex if weight up - just cuts back on eating. She is short of breath after walking about 50-75 feet.  Generally does ok walking around the house.  Sleeps in her recliner (has done this long-term).   Labs (3/16): K 3.6, creatinine 1.21  ECG: atrial fibrillation at 84, QTc 451 msec  - Echo 12/14 showed an EF of 45 to 50% with grade 1 diastolic dysfunction - Echo (7/15) with EF 55-60%.   Labs: (05/11/14): K 3.7 Creatinine 1.05  RHC/LHC 03/21/2014 RA 10 RV 46/14 PA 43/9 mean 28 PCWP 17 LV 135/14 AO 128/59 mean 83 Oxygen saturations: PA 70 AO 96 Cardiac Output (Fick) 7.1  Cardiac Index (Fick) 3.6 Left anterior descending (LAD): Mild diffuse mid-LAD stenosis. There is moderate calcification and the mid-vessel has diffuse 40-50% stenosis. The diagonal are patent and the first diagonal is a large vessel.  Left circumflex (LCx): There is a large intermediate branch without stenosis. The AV circumflex has an eccentric 50% stenosis unchanged from the previous study when the patient  had a negative FFR.  Right coronary artery (RCA): The right cusp is heavily calcified. The ostium of the RCA has 40% stenosis without catheter dampening. The vessel is a large, dominant RCA without significant obstruction. The PDA and PLA branches are patent.  Left ventriculography: There is mild inferior hypokinesis and the LVEF is estimated at 50-55%  ROS: All systems negative except as listed in HPI, PMH and Problem List.  SH:  History   Social History  . Marital Status: Widowed    Spouse Name: N/A  . Number of Children: 2  . Years of Education: N/A   Occupational History  . Retired    Social History Main Topics  . Smoking status: Never Smoker   . Smokeless tobacco: Never Used  . Alcohol Use: No  . Drug Use: No  . Sexual Activity: No   Other Topics Concern  . Not on file   Social History Narrative   ** Merged History Encounter **       Divorced   3 children, 1 deceased    FH:  Family History  Problem Relation Age of Onset  . Heart attack Mother   . Diabetes Mother   . Colon cancer Father   . Esophageal cancer Father   . Kidney cancer Father   . Diabetes Father   . Ovarian cancer Sister   . Liver cancer Sister   . Breast cancer Sister   . Colon cancer Son   .  Colon polyps Son   . Diabetes Sister   . Irritable bowel syndrome Sister   . Myocarditis Brother   . Rectal cancer Neg Hx   . Stomach cancer Neg Hx     Past Medical History  Diagnosis Date  . Hypertensive heart disease   . Hyperlipidemia   . Hypothyroidism   . Obesity (BMI 30-39.9)   . GERD (gastroesophageal reflux disease)   . Degenerative disc disease, cervical   . Asthma   . Vitamin D deficiency   . Depression   . Bell's palsy   . Gastroparesis   . Internal hemorrhoid   . Hiatal hernia   . Anxiety   . Osteoarthritis   . Adenomatous colon polyp 02/13/09  . Diverticulosis   . Status post dilation of esophageal narrowing   . IBS (irritable bowel syndrome)   . PAF (paroxysmal atrial  fibrillation)     a. chronic xarelto   . Diabetes mellitus without complication   . Diverticulitis   . Syncope     a. 12/2012: MDT Reveal LINQ ILR placed;  b. 12/2012 Echo: EF 45-50%, Gr 3 DD, mild MR, mildly dil LA;  c. 12/2012 Carotid U/S: 1-39% bilat ICA stenosis.  Marland Kitchen CAD (coronary artery disease)     a. 12/2012 Nonobstructive by cath: LM nl, LAD 50p/m, LCX 50-25m (FFR 0.93), RCA min irregs, EF 55-65%-->Med Rx.  Marland Kitchen Neuropathy   . Atrial flutter     a. By ILR interrogation.  Marland Kitchen NICM (nonischemic cardiomyopathy)     a. EF 45% with grade 3 diastolic dysfunction by echo 12/2012.    Current Outpatient Prescriptions  Medication Sig Dispense Refill  . ACCU-CHEK SMARTVIEW test strip   11  . acetaminophen (TYLENOL) 500 MG tablet Take 1,000 mg by mouth daily as needed for moderate pain.     . Cholecalciferol 5000 UNITS capsule Take 5,000 Units by mouth daily.    . clonazePAM (KLONOPIN) 0.5 MG tablet Take 0.25-0.5 mg by mouth 3 (three) times daily as needed for anxiety.     Marland Kitchen diltiazem (CARDIZEM CD) 240 MG 24 hr capsule Take 1 capsule (240 mg total) by mouth daily. 30 capsule 2  . diphenhydrAMINE (BENADRYL) 25 MG tablet Take 25 mg by mouth every 6 (six) hours as needed for itching or allergies.     Marland Kitchen estradiol (ESTRACE) 1 MG tablet Take 1 mg by mouth daily.    Marland Kitchen glipiZIDE (GLUCOTROL XL) 10 MG 24 hr tablet Take 10 mg by mouth daily with breakfast.    . Insulin Glargine (LANTUS) 100 UNIT/ML Solostar Pen Inject 35 Units into the skin 2 (two) times daily.     . lansoprazole (PREVACID) 30 MG capsule Take 1 capsule (30 mg total) by mouth daily at 12 noon. 30 capsule 11  . levothyroxine (SYNTHROID, LEVOTHROID) 137 MCG tablet Take 137 mcg by mouth daily before breakfast.    . magnesium oxide (MAG-OX) 400 MG tablet Take 400 mg by mouth 4 (four) times daily.     . metFORMIN (GLUCOPHAGE-XR) 500 MG 24 hr tablet Take 1 tablet (500 mg total) by mouth 2 (two) times daily. 60 tablet 11  . metoprolol (LOPRESSOR)  50 MG tablet Take 1 tablet (50 mg total) by mouth 2 (two) times daily.    . potassium chloride (K-DUR) 10 MEQ tablet Take 2 tablets (20 mEq total) by mouth as directed. (Patient taking differently: Take 20 mEq by mouth 2 (two) times daily. ) 30 tablet 11  . promethazine (PHENERGAN) 25  MG tablet Take 25 mg by mouth every 6 (six) hours as needed for nausea or vomiting.    . rivaroxaban (XARELTO) 20 MG TABS tablet TAKE ONE (1) TABLET EACH DAY 30 tablet 5  . simethicone (MYLICON) 0000000 MG chewable tablet Chew 125 mg by mouth every 6 (six) hours as needed for flatulence.    . simvastatin (ZOCOR) 20 MG tablet Take 10 mg by mouth daily.    Marland Kitchen torsemide (DEMADEX) 20 MG tablet Take 2 tablets (40 mg total) by mouth 2 (two) times daily. 120 tablet 3  . traMADol (ULTRAM) 50 MG tablet Take 50-100 mg by mouth every 6 (six) hours as needed for moderate pain.      No current facility-administered medications for this encounter.    Filed Vitals:   05/31/14 1212  BP: 120/74  Pulse: 75  Weight: 213 lb 1.9 oz (96.671 kg)  SpO2: 95%    PHYSICAL EXAM: General:  Obese woman. Sitting on exam table. No resp  difficulty HEENT: normal Neck: supple. JVP 6-7 cm with HJR. Carotids 2+ bilaterally; no bruits. No lymphadenopathy or thryomegaly appreciated. Cor: PMI normal. Irregular rate & rhythm. No rubs, gallops.  2/6 SEM RUSB.  Lungs: Clear with decreased air movement On 2 liters [place]  Abdomen: obese, soft, nontender, nondistended. No hepatosplenomegaly. No bruits or masses. Good bowel sounds. Extremities: no cyanosis, clubbing, rash, no ankle edema.  Neuro: alert & orientedx3, cranial nerves grossly intact. Moves all 4 extremities w/o difficulty. Affect pleasant.  ASSESSMENT & PLAN:  1. Chronic diastolic CHF: EF 0000000 on 7/15 echo.  Volume status ok despite high fluid intake. NYHA class III symptoms.   - Continue torsemide 40 mg bid and KCl to 20 daily.  BMET ok - We discussed restricting her sodium and fluid  intake.  - Reinforced need for daily weights and reviewed use of sliding scale diuretics. 2. Persistent atrial fibrillation: She is now off Tikosyn due to prolonged QT and torsades (in setting of azithromycin use).  For now, would rate control and anticoagulate.  Rate is reasonable today on metoprolol and diltiazem CD.  Continue Xarelto. Follows with Dr. Alveda Reasons 3. Chronic respiratory failure: On 2 liters nasal cannula chronically.  Bensimhon, Daniel,MD 05/31/2014

## 2014-05-31 NOTE — Patient Instructions (Signed)
Your physician recommends that you schedule a follow-up appointment in: 6 months  Do the following things EVERYDAY: 1) Weigh yourself in the morning before breakfast. Write it down and keep it in a log. 2) Take your medicines as prescribed 3) Eat low salt foods-Limit salt (sodium) to 2000 mg per day.  4) Stay as active as you can everyday 5) Limit all fluids for the day to less than 2 liters 6)

## 2014-06-06 ENCOUNTER — Ambulatory Visit (HOSPITAL_COMMUNITY)
Admission: RE | Admit: 2014-06-06 | Discharge: 2014-06-06 | Disposition: A | Discharge status: Home / Self Care | Payer: Medicare HMO | Source: Ambulatory Visit | Attending: Nurse Practitioner | Admitting: Nurse Practitioner

## 2014-06-06 ENCOUNTER — Encounter (HOSPITAL_COMMUNITY): Payer: Self-pay | Admitting: Nurse Practitioner

## 2014-06-06 ENCOUNTER — Other Ambulatory Visit: Payer: Self-pay

## 2014-06-06 VITALS — BP 110/84 | HR 95 | Ht 62.0 in | Wt 215.4 lb

## 2014-06-06 DIAGNOSIS — I481 Persistent atrial fibrillation: Secondary | ICD-10-CM | POA: Insufficient documentation

## 2014-06-06 DIAGNOSIS — I2510 Atherosclerotic heart disease of native coronary artery without angina pectoris: Secondary | ICD-10-CM | POA: Diagnosis not present

## 2014-06-06 DIAGNOSIS — I5033 Acute on chronic diastolic (congestive) heart failure: Secondary | ICD-10-CM | POA: Insufficient documentation

## 2014-06-06 DIAGNOSIS — I4819 Other persistent atrial fibrillation: Secondary | ICD-10-CM
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
## 2014-06-13 ENCOUNTER — Encounter: Payer: Self-pay | Admitting: Internal Medicine

## 2014-06-14 LAB — CUP PACEART REMOTE DEVICE CHECK: MDC IDC SESS DTM: 20160519135749

## 2014-06-18 ENCOUNTER — Ambulatory Visit (INDEPENDENT_AMBULATORY_CARE_PROVIDER_SITE_OTHER): Payer: Medicare HMO | Admitting: *Deleted

## 2014-06-18 DIAGNOSIS — R55 Syncope and collapse: Secondary | ICD-10-CM

## 2014-06-19 NOTE — Progress Notes (Signed)
Loop recorder 

## 2014-06-20 ENCOUNTER — Telehealth: Payer: Self-pay | Admitting: Cardiology

## 2014-06-20 NOTE — Telephone Encounter (Signed)
Pt's daughter called in wanting to see if Dr. [name] could work in her mother on 6/8 because she doesn't have anyone to bring her on 6/6. Please call  Thanks

## 2014-06-21 NOTE — Telephone Encounter (Signed)
Returned call to patient's daughter [name] no answer.Left message on personal voice mail moved your mothers appointment to 07/04/14 at 4:30 pm with Dr.Jordan at [facility] office.

## 2014-06-21 NOTE — Telephone Encounter (Signed)
[name] can you look at this. Marsha(daughter) is calling to see if she can bring her mother to see Dr. [name] on 07/04/14 so that she can make one trip. Otherwise she has no to bring her mother in. Please call at [phone]  Thanks

## 2014-06-28 ENCOUNTER — Encounter: Payer: Self-pay | Admitting: Internal Medicine

## 2014-06-28 LAB — CUP PACEART REMOTE DEVICE CHECK: MDC IDC SESS DTM: 20160602221002

## 2014-07-02 ENCOUNTER — Ambulatory Visit: Payer: Medicare HMO | Admitting: Cardiology

## 2014-07-04 ENCOUNTER — Encounter: Payer: Self-pay | Admitting: Cardiology

## 2014-07-04 ENCOUNTER — Ambulatory Visit (INDEPENDENT_AMBULATORY_CARE_PROVIDER_SITE_OTHER): Payer: Medicare HMO | Admitting: Cardiology

## 2014-07-04 ENCOUNTER — Ambulatory Visit (INDEPENDENT_AMBULATORY_CARE_PROVIDER_SITE_OTHER): Payer: Medicare HMO | Admitting: Internal Medicine

## 2014-07-04 ENCOUNTER — Encounter: Payer: Self-pay | Admitting: Internal Medicine

## 2014-07-04 VITALS — BP 126/70 | HR 94 | Ht 62.0 in | Wt 218.0 lb

## 2014-07-04 VITALS — BP 122/64 | HR 85 | Ht 62.0 in | Wt 218.2 lb

## 2014-07-04 DIAGNOSIS — I481 Persistent atrial fibrillation: Secondary | ICD-10-CM | POA: Diagnosis not present

## 2014-07-04 DIAGNOSIS — I10 Essential (primary) hypertension: Secondary | ICD-10-CM

## 2014-07-04 DIAGNOSIS — I480 Paroxysmal atrial fibrillation: Secondary | ICD-10-CM

## 2014-07-04 DIAGNOSIS — I5032 Chronic diastolic (congestive) heart failure: Secondary | ICD-10-CM | POA: Diagnosis not present

## 2014-07-04 DIAGNOSIS — I2510 Atherosclerotic heart disease of native coronary artery without angina pectoris: Secondary | ICD-10-CM

## 2014-07-04 DIAGNOSIS — I4819 Other persistent atrial fibrillation: Secondary | ICD-10-CM

## 2014-07-04 DIAGNOSIS — I5033 Acute on chronic diastolic (congestive) heart failure: Secondary | ICD-10-CM | POA: Diagnosis not present

## 2014-07-04 LAB — CUP PACEART INCLINIC DEVICE CHECK
Date Time Interrogation Session: 20160608170326
MDC IDC SESS DTM: 20160608170326
MDC IDC SET ZONE DETECTION INTERVAL: 2000 ms
MDC IDC SET ZONE DETECTION INTERVAL: 3000 ms
MDC IDC SET ZONE DETECTION INTERVAL: 380 ms
Zone Setting Detection Interval: 2000 ms
Zone Setting Detection Interval: 3000 ms
Zone Setting Detection Interval: 380 ms

## 2014-07-04 LAB — CBC WITH DIFFERENTIAL/PLATELET
BASOS ABS: 0 10*3/uL (ref 0.0–0.1)
BASOS PCT: 0.4 % (ref 0.0–3.0)
EOS ABS: 0.1 10*3/uL (ref 0.0–0.7)
Eosinophils Relative: 1 % (ref 0.0–5.0)
HCT: 33.5 % — ABNORMAL LOW (ref 36.0–46.0)
Hemoglobin: 10.3 g/dL — ABNORMAL LOW (ref 12.0–15.0)
Lymphocytes Relative: 33.3 % (ref 12.0–46.0)
Lymphs Abs: 4 10*3/uL (ref 0.7–4.0)
MCHC: 30.8 g/dL (ref 30.0–36.0)
MCV: 74.3 fl — AB (ref 78.0–100.0)
Monocytes Absolute: 0.9 10*3/uL (ref 0.1–1.0)
Monocytes Relative: 7.6 % (ref 3.0–12.0)
Neutro Abs: 6.9 10*3/uL (ref 1.4–7.7)
Neutrophils Relative %: 57.7 % (ref 43.0–77.0)
Platelets: 346 10*3/uL (ref 150.0–400.0)
RBC: 4.51 Mil/uL (ref 3.87–5.11)
RDW: 18.8 % — ABNORMAL HIGH (ref 11.5–15.5)
WBC: 12 10*3/uL — AB (ref 4.0–10.5)

## 2014-07-04 LAB — BASIC METABOLIC PANEL
BUN: 21 mg/dL (ref 6–23)
CO2: 31 meq/L (ref 19–32)
Calcium: 9.9 mg/dL (ref 8.4–10.5)
Chloride: 93 mEq/L — ABNORMAL LOW (ref 96–112)
Creatinine, Ser: 1.19 mg/dL (ref 0.40–1.20)
GFR: 47.42 mL/min — AB (ref >60.00)
GLUCOSE: 211 mg/dL — AB (ref 70–99)
POTASSIUM: 3.5 meq/L (ref 3.5–5.1)
SODIUM: 137 meq/L (ref 135–145)

## 2014-07-04 LAB — TSH: TSH: 2.35 u[IU]/mL (ref 0.35–4.50)

## 2014-07-04 MED ORDER — AMIODARONE HCL 200 MG PO TABS: 200.0000 mg | ORAL_TABLET | Freq: Two times a day (BID) | ORAL | Status: DC

## 2014-07-04 MED ORDER — TORSEMIDE 20 MG PO TABS: ORAL_TABLET | ORAL | Status: DC
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
## 2014-07-04 NOTE — Patient Instructions (Signed)
Increase torsemide to 60 mg in the morning and 40 mg in the evening. If needed you can take 60 mg in the evening as well for increased weight.  Start amiodarone as outlined by Dr. Rayann Heman.  I will see you in 2 months
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
## 2014-07-04 NOTE — Patient Instructions (Signed)
Medication Instructions:  Your physician has recommended you make the following change in your medication:  1) Start Amiodarone 200 mg twice daily   Labwork: Your physician recommends that you return for lab work on BMP/CBC/TSH/T4   Testing/Procedures: None ordered  Follow-Up: Your physician recommends that you schedule a follow-up appointment in: 2 weeks with Roderic Palau, NP   Any Other Special Instructions Will Be Listed Below (If Applicable).

## 2014-07-09 ENCOUNTER — Encounter: Payer: Self-pay | Admitting: Internal Medicine

## 2014-07-10 ENCOUNTER — Other Ambulatory Visit: Payer: Self-pay | Admitting: Cardiology

## 2014-07-18 ENCOUNTER — Encounter: Payer: Self-pay | Admitting: Internal Medicine

## 2014-07-18 ENCOUNTER — Ambulatory Visit (INDEPENDENT_AMBULATORY_CARE_PROVIDER_SITE_OTHER): Payer: Medicare HMO | Admitting: *Deleted

## 2014-07-18 DIAGNOSIS — R55 Syncope and collapse: Secondary | ICD-10-CM | POA: Diagnosis not present

## 2014-07-18 NOTE — Progress Notes (Signed)
Loop recorder 

## 2014-07-19 ENCOUNTER — Other Ambulatory Visit (HOSPITAL_COMMUNITY): Payer: Self-pay | Admitting: *Deleted

## 2014-07-19 ENCOUNTER — Ambulatory Visit (HOSPITAL_COMMUNITY)
Admission: RE | Admit: 2014-07-19 | Discharge: 2014-07-19 | Disposition: A | Discharge status: Home / Self Care | Payer: Medicare HMO | Source: Ambulatory Visit | Attending: Nurse Practitioner | Admitting: Nurse Practitioner

## 2014-07-19 ENCOUNTER — Encounter (HOSPITAL_COMMUNITY): Payer: Self-pay | Admitting: Nurse Practitioner

## 2014-07-19 VITALS — BP 122/78 | HR 75 | Ht 62.0 in | Wt 215.4 lb

## 2014-07-19 DIAGNOSIS — Z7902 Long term (current) use of antithrombotics/antiplatelets: Secondary | ICD-10-CM | POA: Insufficient documentation

## 2014-07-19 DIAGNOSIS — I480 Paroxysmal atrial fibrillation: Secondary | ICD-10-CM

## 2014-07-19 DIAGNOSIS — E559 Vitamin D deficiency, unspecified: Secondary | ICD-10-CM | POA: Diagnosis not present

## 2014-07-19 DIAGNOSIS — I5033 Acute on chronic diastolic (congestive) heart failure: Secondary | ICD-10-CM | POA: Diagnosis not present

## 2014-07-19 DIAGNOSIS — Z880 Allergy status to penicillin: Secondary | ICD-10-CM | POA: Diagnosis not present

## 2014-07-19 DIAGNOSIS — Z8249 Family history of ischemic heart disease and other diseases of the circulatory system: Secondary | ICD-10-CM | POA: Diagnosis not present

## 2014-07-19 DIAGNOSIS — Z833 Family history of diabetes mellitus: Secondary | ICD-10-CM | POA: Diagnosis not present

## 2014-07-19 DIAGNOSIS — Z794 Long term (current) use of insulin: Secondary | ICD-10-CM | POA: Diagnosis not present

## 2014-07-19 DIAGNOSIS — E039 Hypothyroidism, unspecified: Secondary | ICD-10-CM | POA: Insufficient documentation

## 2014-07-19 DIAGNOSIS — E1140 Type 2 diabetes mellitus with diabetic neuropathy, unspecified: Secondary | ICD-10-CM | POA: Diagnosis not present

## 2014-07-19 DIAGNOSIS — I429 Cardiomyopathy, unspecified: Secondary | ICD-10-CM | POA: Diagnosis not present

## 2014-07-19 DIAGNOSIS — J45909 Unspecified asthma, uncomplicated: Secondary | ICD-10-CM | POA: Insufficient documentation

## 2014-07-19 DIAGNOSIS — I10 Essential (primary) hypertension: Secondary | ICD-10-CM | POA: Diagnosis not present

## 2014-07-19 DIAGNOSIS — K219 Gastro-esophageal reflux disease without esophagitis: Secondary | ICD-10-CM | POA: Diagnosis not present

## 2014-07-19 DIAGNOSIS — I481 Persistent atrial fibrillation: Secondary | ICD-10-CM | POA: Diagnosis present

## 2014-07-19 DIAGNOSIS — Z79899 Other long term (current) drug therapy: Secondary | ICD-10-CM | POA: Diagnosis not present

## 2014-07-19 DIAGNOSIS — E785 Hyperlipidemia, unspecified: Secondary | ICD-10-CM | POA: Diagnosis not present

## 2014-07-19 DIAGNOSIS — I4819 Other persistent atrial fibrillation: Secondary | ICD-10-CM

## 2014-07-19 LAB — CBC
HCT: 36.7 % (ref 36.0–46.0)
HEMOGLOBIN: 11 g/dL — AB (ref 12.0–15.0)
MCH: 23 pg — AB (ref 26.0–34.0)
MCHC: 30 g/dL (ref 30.0–36.0)
MCV: 76.6 fL — ABNORMAL LOW (ref 78.0–100.0)
Platelets: 283 10*3/uL (ref 150–400)
RBC: 4.79 MIL/uL (ref 3.87–5.11)
RDW: 17.6 % — ABNORMAL HIGH (ref 11.5–15.5)
WBC: 10.5 10*3/uL (ref 4.0–10.5)

## 2014-07-19 LAB — BASIC METABOLIC PANEL
ANION GAP: 12 (ref 5–15)
BUN: 13 mg/dL (ref 6–20)
CALCIUM: 9.1 mg/dL (ref 8.9–10.3)
CHLORIDE: 92 mmol/L — AB (ref 101–111)
CO2: 34 mmol/L — ABNORMAL HIGH (ref 22–32)
Creatinine, Ser: 1.29 mg/dL — ABNORMAL HIGH (ref 0.44–1.00)
GFR calc non Af Amer: 41 mL/min — ABNORMAL LOW (ref >60)
GFR, EST AFRICAN AMERICAN: 47 mL/min — AB (ref >60)
Glucose, Bld: 129 mg/dL — ABNORMAL HIGH (ref 65–99)
Potassium: 3.3 mmol/L — ABNORMAL LOW (ref 3.5–5.1)
Sodium: 138 mmol/L (ref 135–145)

## 2014-07-19 MED ORDER — POTASSIUM CHLORIDE ER 20 MEQ PO TBCR: 20.0000 meq | EXTENDED_RELEASE_TABLET | Freq: Two times a day (BID) | ORAL | Status: DC

## 2014-07-19 NOTE — Telephone Encounter (Signed)
Pt called back stating she looked at her bottles and she has only been taking 30meq twice a day not 20 bid.  Instructed her to start taking 36meq bid. New RX called in.
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
## 2014-07-19 NOTE — Patient Instructions (Signed)
Cardioversion is scheduled for Thursday, June 30th   - Arrive at the [facility] and go to admitting @ 8am  - Do not eat or drink anything after midnight the night prior to the procedure.  - Take medications (except diabetic medication and fluid pill until after you get home) with a sip of water.  - You will not be able to drive home after your procedure.   Follow up in one week after cardioversion -- parking code [id number]

## 2014-07-24 ENCOUNTER — Encounter: Payer: Self-pay | Admitting: Internal Medicine

## 2014-07-24 LAB — CUP PACEART REMOTE DEVICE CHECK: MDC IDC SESS DTM: 20160628094923

## 2014-07-26 ENCOUNTER — Ambulatory Visit (HOSPITAL_COMMUNITY)
Admission: RE | Admit: 2014-07-26 | Discharge: 2014-07-26 | Disposition: A | Discharge status: Home / Self Care | Payer: Medicare HMO | Source: Ambulatory Visit | Attending: Cardiology | Admitting: Cardiology

## 2014-07-26 ENCOUNTER — Encounter (HOSPITAL_COMMUNITY): Payer: Self-pay | Admitting: *Deleted

## 2014-07-26 ENCOUNTER — Encounter (HOSPITAL_COMMUNITY): Payer: Self-pay | Admitting: Anesthesiology

## 2014-07-26 ENCOUNTER — Encounter (HOSPITAL_COMMUNITY)
Admission: RE | Disposition: A | Discharge status: Home / Self Care | Payer: Self-pay | Source: Ambulatory Visit | Attending: Cardiology

## 2014-07-26 DIAGNOSIS — Z794 Long term (current) use of insulin: Secondary | ICD-10-CM | POA: Diagnosis not present

## 2014-07-26 DIAGNOSIS — E669 Obesity, unspecified: Secondary | ICD-10-CM | POA: Insufficient documentation

## 2014-07-26 DIAGNOSIS — J45909 Unspecified asthma, uncomplicated: Secondary | ICD-10-CM | POA: Diagnosis not present

## 2014-07-26 DIAGNOSIS — Z79891 Long term (current) use of opiate analgesic: Secondary | ICD-10-CM | POA: Insufficient documentation

## 2014-07-26 DIAGNOSIS — Z7901 Long term (current) use of anticoagulants: Secondary | ICD-10-CM | POA: Diagnosis not present

## 2014-07-26 DIAGNOSIS — Z6839 Body mass index (BMI) 39.0-39.9, adult: Secondary | ICD-10-CM | POA: Diagnosis not present

## 2014-07-26 DIAGNOSIS — K589 Irritable bowel syndrome without diarrhea: Secondary | ICD-10-CM | POA: Insufficient documentation

## 2014-07-26 DIAGNOSIS — I110 Hypertensive heart disease with heart failure: Secondary | ICD-10-CM | POA: Insufficient documentation

## 2014-07-26 DIAGNOSIS — K219 Gastro-esophageal reflux disease without esophagitis: Secondary | ICD-10-CM | POA: Insufficient documentation

## 2014-07-26 DIAGNOSIS — K3184 Gastroparesis: Secondary | ICD-10-CM | POA: Insufficient documentation

## 2014-07-26 DIAGNOSIS — E039 Hypothyroidism, unspecified: Secondary | ICD-10-CM | POA: Insufficient documentation

## 2014-07-26 DIAGNOSIS — I2510 Atherosclerotic heart disease of native coronary artery without angina pectoris: Secondary | ICD-10-CM | POA: Diagnosis not present

## 2014-07-26 DIAGNOSIS — E785 Hyperlipidemia, unspecified: Secondary | ICD-10-CM | POA: Diagnosis not present

## 2014-07-26 DIAGNOSIS — E876 Hypokalemia: Secondary | ICD-10-CM | POA: Diagnosis not present

## 2014-07-26 DIAGNOSIS — M1990 Unspecified osteoarthritis, unspecified site: Secondary | ICD-10-CM | POA: Diagnosis not present

## 2014-07-26 DIAGNOSIS — Z79899 Other long term (current) drug therapy: Secondary | ICD-10-CM | POA: Diagnosis not present

## 2014-07-26 DIAGNOSIS — I5033 Acute on chronic diastolic (congestive) heart failure: Secondary | ICD-10-CM | POA: Diagnosis not present

## 2014-07-26 DIAGNOSIS — Z79890 Hormone replacement therapy (postmenopausal): Secondary | ICD-10-CM | POA: Insufficient documentation

## 2014-07-26 DIAGNOSIS — G510 Bell's palsy: Secondary | ICD-10-CM | POA: Diagnosis not present

## 2014-07-26 DIAGNOSIS — E119 Type 2 diabetes mellitus without complications: Secondary | ICD-10-CM | POA: Insufficient documentation

## 2014-07-26 DIAGNOSIS — E559 Vitamin D deficiency, unspecified: Secondary | ICD-10-CM | POA: Diagnosis not present

## 2014-07-26 DIAGNOSIS — Z538 Procedure and treatment not carried out for other reasons: Secondary | ICD-10-CM | POA: Insufficient documentation

## 2014-07-26 DIAGNOSIS — Z8249 Family history of ischemic heart disease and other diseases of the circulatory system: Secondary | ICD-10-CM | POA: Insufficient documentation

## 2014-07-26 DIAGNOSIS — G629 Polyneuropathy, unspecified: Secondary | ICD-10-CM | POA: Insufficient documentation

## 2014-07-26 DIAGNOSIS — I429 Cardiomyopathy, unspecified: Secondary | ICD-10-CM | POA: Diagnosis not present

## 2014-07-26 DIAGNOSIS — F419 Anxiety disorder, unspecified: Secondary | ICD-10-CM | POA: Insufficient documentation

## 2014-07-26 DIAGNOSIS — I481 Persistent atrial fibrillation: Secondary | ICD-10-CM | POA: Diagnosis not present

## 2014-07-26 DIAGNOSIS — I4891 Unspecified atrial fibrillation: Secondary | ICD-10-CM | POA: Diagnosis present

## 2014-07-26 LAB — POCT I-STAT 4, (NA,K, GLUC, HGB,HCT)
Glucose, Bld: 101 mg/dL — ABNORMAL HIGH (ref 65–99)
HEMATOCRIT: 35 % — AB (ref 36.0–46.0)
HEMOGLOBIN: 11.9 g/dL — AB (ref 12.0–15.0)
POTASSIUM: 3.2 mmol/L — AB (ref 3.5–5.1)
Sodium: 141 mmol/L (ref 135–145)

## 2014-07-26 LAB — GLUCOSE, CAPILLARY: GLUCOSE-CAPILLARY: 96 mg/dL (ref 65–99)

## 2014-07-26 SURGERY — CANCELLED PROCEDURE

## 2014-07-26 MED ORDER — SODIUM CHLORIDE 0.9 % IV SOLN
INTRAVENOUS | Status: DC
  Administered 2014-07-26: 09:00:00 via INTRAVENOUS

## 2014-07-26 MED ORDER — POTASSIUM CHLORIDE ER 20 MEQ PO TBCR: EXTENDED_RELEASE_TABLET | ORAL | Status: DC

## 2014-07-26 NOTE — Interval H&P Note (Signed)
History and Physical Interval Note:  07/26/2014 8:15 AM  [name]  has presented today for surgery, with the diagnosis of AFIB  The various methods of treatment have been discussed with the patient and family. After consideration of risks, benefits and other options for treatment, the patient has consented to  Procedure(s): CARDIOVERSION (N/A) as a surgical intervention .  The patient's history has been reviewed, patient examined, no change in status, stable for surgery.  I have reviewed the patient's chart and labs.  Questions were answered to the patient's satisfaction.     TURNER,TRACI R
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
## 2014-07-26 NOTE — CV Procedure (Signed)
   Patient's potassium came back at 3.2.  Case cancelled for today.  Will give Kdur 39meq now and again tonight.  Then change to Kdur 63meq  qam and 65meq qpm starting tomorrow.  Rescheduled for tomorrow at 11am.  Will need stat BMET on arrival.     TURNER,TRACI R 07/26/2014, 8:17 AM

## 2014-07-27 ENCOUNTER — Encounter (HOSPITAL_COMMUNITY): Payer: Self-pay | Admitting: Family Medicine

## 2014-07-27 ENCOUNTER — Encounter (HOSPITAL_COMMUNITY): Payer: Self-pay

## 2014-07-27 ENCOUNTER — Emergency Department (EMERGENCY_DEPARTMENT_HOSPITAL)
Admission: EM | Admit: 2014-07-27 | Discharge: 2014-07-27 | Disposition: A | Discharge status: Home / Self Care | Payer: Medicare HMO | Source: Home / Self Care | Attending: Emergency Medicine | Admitting: Emergency Medicine

## 2014-07-27 ENCOUNTER — Ambulatory Visit (HOSPITAL_COMMUNITY): Payer: Medicare HMO | Admitting: Anesthesiology

## 2014-07-27 ENCOUNTER — Emergency Department (HOSPITAL_COMMUNITY): Payer: Medicare HMO

## 2014-07-27 ENCOUNTER — Ambulatory Visit (HOSPITAL_COMMUNITY)
Admission: RE | Admit: 2014-07-27 | Discharge: 2014-07-27 | Disposition: A | Discharge status: Home / Self Care | Payer: Medicare HMO | Source: Ambulatory Visit | Attending: Internal Medicine | Admitting: Internal Medicine

## 2014-07-27 ENCOUNTER — Encounter (HOSPITAL_COMMUNITY)
Admission: RE | Disposition: A | Discharge status: Home / Self Care | Payer: Self-pay | Source: Ambulatory Visit | Attending: Internal Medicine

## 2014-07-27 DIAGNOSIS — I4891 Unspecified atrial fibrillation: Secondary | ICD-10-CM | POA: Diagnosis present

## 2014-07-27 DIAGNOSIS — F419 Anxiety disorder, unspecified: Secondary | ICD-10-CM

## 2014-07-27 DIAGNOSIS — Z79899 Other long term (current) drug therapy: Secondary | ICD-10-CM

## 2014-07-27 DIAGNOSIS — K449 Diaphragmatic hernia without obstruction or gangrene: Secondary | ICD-10-CM | POA: Insufficient documentation

## 2014-07-27 DIAGNOSIS — I4892 Unspecified atrial flutter: Secondary | ICD-10-CM | POA: Insufficient documentation

## 2014-07-27 DIAGNOSIS — I481 Persistent atrial fibrillation: Secondary | ICD-10-CM

## 2014-07-27 DIAGNOSIS — E559 Vitamin D deficiency, unspecified: Secondary | ICD-10-CM | POA: Insufficient documentation

## 2014-07-27 DIAGNOSIS — I739 Peripheral vascular disease, unspecified: Secondary | ICD-10-CM | POA: Diagnosis not present

## 2014-07-27 DIAGNOSIS — E785 Hyperlipidemia, unspecified: Secondary | ICD-10-CM | POA: Insufficient documentation

## 2014-07-27 DIAGNOSIS — R072 Precordial pain: Secondary | ICD-10-CM

## 2014-07-27 DIAGNOSIS — I509 Heart failure, unspecified: Secondary | ICD-10-CM | POA: Insufficient documentation

## 2014-07-27 DIAGNOSIS — Z9889 Other specified postprocedural states: Secondary | ICD-10-CM | POA: Insufficient documentation

## 2014-07-27 DIAGNOSIS — R079 Chest pain, unspecified: Secondary | ICD-10-CM

## 2014-07-27 DIAGNOSIS — Z794 Long term (current) use of insulin: Secondary | ICD-10-CM

## 2014-07-27 DIAGNOSIS — I119 Hypertensive heart disease without heart failure: Secondary | ICD-10-CM

## 2014-07-27 DIAGNOSIS — K219 Gastro-esophageal reflux disease without esophagitis: Secondary | ICD-10-CM | POA: Insufficient documentation

## 2014-07-27 DIAGNOSIS — J45909 Unspecified asthma, uncomplicated: Secondary | ICD-10-CM | POA: Insufficient documentation

## 2014-07-27 DIAGNOSIS — R0602 Shortness of breath: Secondary | ICD-10-CM

## 2014-07-27 DIAGNOSIS — Z8669 Personal history of other diseases of the nervous system and sense organs: Secondary | ICD-10-CM

## 2014-07-27 DIAGNOSIS — J45901 Unspecified asthma with (acute) exacerbation: Secondary | ICD-10-CM

## 2014-07-27 DIAGNOSIS — Z9981 Dependence on supplemental oxygen: Secondary | ICD-10-CM | POA: Diagnosis not present

## 2014-07-27 DIAGNOSIS — I5032 Chronic diastolic (congestive) heart failure: Secondary | ICD-10-CM

## 2014-07-27 DIAGNOSIS — I252 Old myocardial infarction: Secondary | ICD-10-CM | POA: Insufficient documentation

## 2014-07-27 DIAGNOSIS — I10 Essential (primary) hypertension: Secondary | ICD-10-CM | POA: Insufficient documentation

## 2014-07-27 DIAGNOSIS — E119 Type 2 diabetes mellitus without complications: Secondary | ICD-10-CM | POA: Insufficient documentation

## 2014-07-27 DIAGNOSIS — Z7901 Long term (current) use of anticoagulants: Secondary | ICD-10-CM | POA: Insufficient documentation

## 2014-07-27 DIAGNOSIS — I2510 Atherosclerotic heart disease of native coronary artery without angina pectoris: Secondary | ICD-10-CM

## 2014-07-27 DIAGNOSIS — Z86018 Personal history of other benign neoplasm: Secondary | ICD-10-CM | POA: Insufficient documentation

## 2014-07-27 DIAGNOSIS — M1990 Unspecified osteoarthritis, unspecified site: Secondary | ICD-10-CM

## 2014-07-27 DIAGNOSIS — E039 Hypothyroidism, unspecified: Secondary | ICD-10-CM

## 2014-07-27 DIAGNOSIS — F329 Major depressive disorder, single episode, unspecified: Secondary | ICD-10-CM

## 2014-07-27 DIAGNOSIS — I4819 Other persistent atrial fibrillation: Secondary | ICD-10-CM | POA: Insufficient documentation

## 2014-07-27 DIAGNOSIS — E669 Obesity, unspecified: Secondary | ICD-10-CM | POA: Insufficient documentation

## 2014-07-27 DIAGNOSIS — Z880 Allergy status to penicillin: Secondary | ICD-10-CM | POA: Insufficient documentation

## 2014-07-27 HISTORY — PX: CARDIOVERSION: SHX1299

## 2014-07-27 LAB — POCT I-STAT 4, (NA,K, GLUC, HGB,HCT)
Glucose, Bld: 139 mg/dL — ABNORMAL HIGH (ref 65–99)
HCT: 37 % (ref 36.0–46.0)
Hemoglobin: 12.6 g/dL (ref 12.0–15.0)
POTASSIUM: 4.3 mmol/L (ref 3.5–5.1)
SODIUM: 138 mmol/L (ref 135–145)

## 2014-07-27 LAB — BASIC METABOLIC PANEL
Anion gap: 12 (ref 5–15)
BUN: 12 mg/dL (ref 6–20)
CHLORIDE: 97 mmol/L — AB (ref 101–111)
CO2: 32 mmol/L (ref 22–32)
Calcium: 9.2 mg/dL (ref 8.9–10.3)
Creatinine, Ser: 1.31 mg/dL — ABNORMAL HIGH (ref 0.44–1.00)
GFR calc Af Amer: 46 mL/min — ABNORMAL LOW (ref >60)
GFR calc non Af Amer: 40 mL/min — ABNORMAL LOW (ref >60)
Glucose, Bld: 213 mg/dL — ABNORMAL HIGH (ref 65–99)
Potassium: 4.5 mmol/L (ref 3.5–5.1)
SODIUM: 141 mmol/L (ref 135–145)

## 2014-07-27 LAB — URINALYSIS, ROUTINE W REFLEX MICROSCOPIC
Bilirubin Urine: NEGATIVE
GLUCOSE, UA: NEGATIVE mg/dL
Hgb urine dipstick: NEGATIVE
Ketones, ur: NEGATIVE mg/dL
LEUKOCYTES UA: NEGATIVE
NITRITE: NEGATIVE
PROTEIN: NEGATIVE mg/dL
Specific Gravity, Urine: 1.013 (ref 1.005–1.030)
Urobilinogen, UA: 0.2 mg/dL (ref 0.0–1.0)
pH: 5 (ref 5.0–8.0)

## 2014-07-27 LAB — BRAIN NATRIURETIC PEPTIDE: B NATRIURETIC PEPTIDE 5: 186.3 pg/mL — AB (ref 0.0–100.0)

## 2014-07-27 LAB — CBC
HCT: 34.3 % — ABNORMAL LOW (ref 36.0–46.0)
Hemoglobin: 9.9 g/dL — ABNORMAL LOW (ref 12.0–15.0)
MCH: 22.5 pg — AB (ref 26.0–34.0)
MCHC: 28.9 g/dL — AB (ref 30.0–36.0)
MCV: 78 fL (ref 78.0–100.0)
PLATELETS: 293 10*3/uL (ref 150–400)
RBC: 4.4 MIL/uL (ref 3.87–5.11)
RDW: 17.9 % — AB (ref 11.5–15.5)
WBC: 8.9 10*3/uL (ref 4.0–10.5)

## 2014-07-27 LAB — GLUCOSE, CAPILLARY: GLUCOSE-CAPILLARY: 129 mg/dL — AB (ref 65–99)

## 2014-07-27 LAB — I-STAT TROPONIN, ED: Troponin i, poc: 0 ng/mL (ref 0.00–0.08)

## 2014-07-27 SURGERY — CARDIOVERSION
Anesthesia: General

## 2014-07-27 MED ORDER — SODIUM CHLORIDE 0.9 % IV SOLN: INTRAVENOUS | Status: DC

## 2014-07-27 MED ORDER — LIDOCAINE HCL (CARDIAC) 20 MG/ML IV SOLN
INTRAVENOUS | Status: DC | PRN
  Administered 2014-07-27: 40 mg via INTRAVENOUS

## 2014-07-27 MED ORDER — LACTATED RINGERS IV SOLN
INTRAVENOUS | Status: DC | PRN
  Administered 2014-07-27: 09:00:00 via INTRAVENOUS

## 2014-07-27 MED ORDER — PROPOFOL 10 MG/ML IV BOLUS
INTRAVENOUS | Status: DC | PRN
  Administered 2014-07-27: 40 mg via INTRAVENOUS
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
## 2014-07-27 NOTE — ED Notes (Signed)
Pt exiting department in NAD. VSS. Atrial Fibrillation @ rate of 75. A/Ox4.

## 2014-07-27 NOTE — ED Notes (Signed)
Pt here for afib, chest pain and SOB. sts was just here earlier for cardioversion. Unable to cardiovert.

## 2014-07-27 NOTE — Anesthesia Postprocedure Evaluation (Signed)
  Anesthesia Post-op Note  Patient: [name]  Procedure(s) Performed: Procedure(s): CARDIOVERSION (N/A)  Patient Location: Endoscopy  Anesthesia Type:General  Level of Consciousness: awake, alert  and oriented  Airway and Oxygen Therapy: Patient Spontanous Breathing  Post-op Pain: none  Post-op Assessment: Post-op Vital signs reviewed, Patient's Cardiovascular Status Stable, Respiratory Function Stable, Patent Airway and Pain level controlled              Post-op Vital Signs: stable  Last Vitals:  Filed Vitals:   07/27/14 1010  BP: 115/65  Pulse: 97  Temp:   Resp: 18    Complications: No apparent anesthesia complications

## 2014-07-27 NOTE — H&P (Signed)
     INTERVAL PROCEDURE H&P  History and Physical Interval Note:  07/27/2014 9:15 AM  [name] has presented today for their planned procedure. The various methods of treatment have been discussed with the patient and family. After consideration of risks, benefits and other options for treatment, the patient has consented to the procedure.  The patients' outpatient history has been reviewed, patient examined, and no change in status from most recent office note within the past 30 days. I have reviewed the patients' chart and labs and will proceed as planned. Questions were answered to the patient's satisfaction.   Pixie Casino, MD, [facility] Attending Cardiologist [place] Braxton Vantrease 07/27/2014, 9:15 AM

## 2014-07-27 NOTE — Progress Notes (Signed)
Dr. Haroldine Laws made aware of CHF patient arrival to ED with c/o CP.  Renee Pain

## 2014-07-27 NOTE — ED Notes (Signed)
Cardiology at bedside.

## 2014-07-27 NOTE — Transfer of Care (Signed)
Immediate Anesthesia Transfer of Care Note  Patient: [name]  Procedure(s) Performed: Procedure(s): CARDIOVERSION (N/A)  Patient Location: Endoscopy Unit  Anesthesia Type:MAC  Level of Consciousness: awake  Airway & Oxygen Therapy: Patient Spontanous Breathing and Patient connected to nasal cannula oxygen  Post-op Assessment: Report given to RN and Post -op Vital signs reviewed and stable  Post vital signs: Reviewed and stable  Last Vitals:  Filed Vitals:   07/27/14 0807  BP: 114/66  Pulse: 64  Temp: 36.9 C  Resp: 16    Complications: No apparent anesthesia complications

## 2014-07-27 NOTE — Discharge Instructions (Signed)

## 2014-07-27 NOTE — Anesthesia Preprocedure Evaluation (Addendum)
Anesthesia Evaluation  Patient identified by MRN, date of birth, ID band Patient awake    Reviewed: Allergy & Precautions, NPO status , Patient's Chart, lab work & pertinent test results  Airway Mallampati: II  TM Distance: >3 FB Neck ROM: Full    Dental  (+) Poor Dentition   Pulmonary shortness of breath, with exertion, lying and Long-Term Oxygen Therapy, asthma ,  breath sounds clear to auscultation        Cardiovascular hypertension, Pt. on medications + CAD, + Past MI, + Peripheral Vascular Disease and +CHF Rhythm:Irregular Rate:Normal     Neuro/Psych    GI/Hepatic hiatal hernia, GERD-  ,  Endo/Other  diabetesHypothyroidism   Renal/GU      Musculoskeletal   Abdominal   Peds  Hematology   Anesthesia Other Findings   Reproductive/Obstetrics                          Anesthesia Physical Anesthesia Plan  ASA: III  Anesthesia Plan: General   Post-op Pain Management:    Induction: Intravenous  Airway Management Planned: Mask  Additional Equipment:   Intra-op Plan:   Post-operative Plan:   Informed Consent: I have reviewed the patients History and Physical, chart, labs and discussed the procedure including the risks, benefits and alternatives for the proposed anesthesia with the patient or authorized representative who has indicated his/her understanding and acceptance.   Dental advisory given  Plan Discussed with: CRNA and Anesthesiologist  Anesthesia Plan Comments:         Anesthesia Quick Evaluation
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
## 2014-07-27 NOTE — CV Procedure (Signed)
    CARDIOVERSION NOTE  Procedure: Electrical Cardioversion Indications:  Atrial Fibrillation  Procedure Details:  Consent: Risks of procedure as well as the alternatives and risks of each were explained to the (patient/caregiver).  Consent for procedure obtained.  Time Out: Verified patient identification, verified procedure, site/side was marked, verified correct patient position, special equipment/implants available, medications/allergies/relevent history reviewed, required imaging and test results available.  Performed  Patient placed on cardiac monitor, pulse oximetry, supplemental oxygen as necessary.  Sedation given: Propofol per anesthesia Pacer pads placed anterior and posterior chest.  Cardioverted 3 time(s).  Cardioverted at 150J, 200J and 200J.  Impression: Findings: Post procedure EKG shows: Atrial Fibrillation Complications: None Patient did tolerate procedure well.  Plan: 1. Unsuccessful DCCV despite multiple attempts and pad positions.  2. May need to reconsider efforts to achieve NSR or adjust antiarrhythmic medication.  Time Spent Directly with the Patient:  30 minutes   Pixie Casino, MD, [facility] Attending Cardiologist [place] 07/27/2014, 9:49 AM

## 2014-07-27 NOTE — Discharge Instructions (Signed)
Return to the ED with any concerns including difficulty breathing, worsening chest pain, fainting, decreased level of alertness/lethargy, or any other alarming symptoms

## 2014-07-31 ENCOUNTER — Encounter (HOSPITAL_COMMUNITY): Payer: Self-pay | Admitting: Internal Medicine

## 2014-08-02 ENCOUNTER — Ambulatory Visit (HOSPITAL_COMMUNITY)
Admission: RE | Admit: 2014-08-02 | Discharge: 2014-08-02 | Disposition: A | Discharge status: Home / Self Care | Payer: Medicare HMO | Source: Ambulatory Visit | Attending: Nurse Practitioner | Admitting: Nurse Practitioner

## 2014-08-02 ENCOUNTER — Encounter: Payer: Self-pay | Admitting: Cardiology

## 2014-08-02 ENCOUNTER — Encounter (HOSPITAL_COMMUNITY): Payer: Self-pay | Admitting: Nurse Practitioner

## 2014-08-02 VITALS — BP 140/88 | HR 68 | Ht 62.0 in | Wt 220.4 lb

## 2014-08-02 DIAGNOSIS — E669 Obesity, unspecified: Secondary | ICD-10-CM | POA: Diagnosis not present

## 2014-08-02 DIAGNOSIS — Z833 Family history of diabetes mellitus: Secondary | ICD-10-CM | POA: Insufficient documentation

## 2014-08-02 DIAGNOSIS — I4819 Other persistent atrial fibrillation: Secondary | ICD-10-CM

## 2014-08-02 DIAGNOSIS — I480 Paroxysmal atrial fibrillation: Secondary | ICD-10-CM | POA: Diagnosis not present

## 2014-08-02 DIAGNOSIS — E039 Hypothyroidism, unspecified: Secondary | ICD-10-CM | POA: Diagnosis not present

## 2014-08-02 DIAGNOSIS — Z880 Allergy status to penicillin: Secondary | ICD-10-CM | POA: Insufficient documentation

## 2014-08-02 DIAGNOSIS — Z794 Long term (current) use of insulin: Secondary | ICD-10-CM | POA: Insufficient documentation

## 2014-08-02 DIAGNOSIS — I429 Cardiomyopathy, unspecified: Secondary | ICD-10-CM | POA: Insufficient documentation

## 2014-08-02 DIAGNOSIS — Z8249 Family history of ischemic heart disease and other diseases of the circulatory system: Secondary | ICD-10-CM | POA: Insufficient documentation

## 2014-08-02 DIAGNOSIS — E1140 Type 2 diabetes mellitus with diabetic neuropathy, unspecified: Secondary | ICD-10-CM | POA: Diagnosis not present

## 2014-08-02 DIAGNOSIS — J45909 Unspecified asthma, uncomplicated: Secondary | ICD-10-CM | POA: Insufficient documentation

## 2014-08-02 DIAGNOSIS — E785 Hyperlipidemia, unspecified: Secondary | ICD-10-CM | POA: Diagnosis not present

## 2014-08-02 DIAGNOSIS — K219 Gastro-esophageal reflux disease without esophagitis: Secondary | ICD-10-CM | POA: Diagnosis not present

## 2014-08-02 DIAGNOSIS — I10 Essential (primary) hypertension: Secondary | ICD-10-CM | POA: Insufficient documentation

## 2014-08-02 DIAGNOSIS — Z79899 Other long term (current) drug therapy: Secondary | ICD-10-CM | POA: Insufficient documentation

## 2014-08-02 DIAGNOSIS — I5033 Acute on chronic diastolic (congestive) heart failure: Secondary | ICD-10-CM | POA: Diagnosis not present

## 2014-08-02 DIAGNOSIS — E559 Vitamin D deficiency, unspecified: Secondary | ICD-10-CM | POA: Insufficient documentation

## 2014-08-02 DIAGNOSIS — I2510 Atherosclerotic heart disease of native coronary artery without angina pectoris: Secondary | ICD-10-CM | POA: Diagnosis not present

## 2014-08-02 DIAGNOSIS — I481 Persistent atrial fibrillation: Secondary | ICD-10-CM

## 2014-08-02 MED ORDER — AMIODARONE HCL 200 MG PO TABS: 200.0000 mg | ORAL_TABLET | Freq: Every day | ORAL | Status: DC

## 2014-08-02 NOTE — Patient Instructions (Addendum)
Your physician has recommended you make the following change in your medication:  1)Decrease Amiodarone to 200mg  once a day  The scheduler for Dr. Rayann Heman will call you regarding appointment to discuss ablation
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
## 2014-08-06 ENCOUNTER — Telehealth: Payer: Self-pay | Admitting: Cardiology

## 2014-08-06 NOTE — Telephone Encounter (Signed)
Received records from [facility]ocr[facility] for appointment on 10/02/14 with Dr [name].  Records given to D Chavis (medical records) for Dr Doug Sou schedule on 10/02/14. lp

## 2014-08-07 ENCOUNTER — Telehealth: Payer: Self-pay | Admitting: Cardiology

## 2014-08-07 NOTE — Telephone Encounter (Signed)
Received records from [facility]ocr[facility] for appointment on 10/02/14 with Dr [name].  Records given to D. Chavis (medical records) for Dr Doug Sou schedule on 10/02/14. lp

## 2014-08-10 ENCOUNTER — Telehealth: Payer: Self-pay | Admitting: Internal Medicine

## 2014-08-10 NOTE — Telephone Encounter (Signed)
I spoke with the daughter and let her know the day of her procedure

## 2014-08-10 NOTE — Telephone Encounter (Signed)
New message   Pt is supposed to come in for the procedure request a call back to discuss what the different dates are for. Pt is not clear on the details of the procedure wither. Please call

## 2014-08-14 ENCOUNTER — Telehealth (HOSPITAL_COMMUNITY): Payer: Self-pay | Admitting: *Deleted

## 2014-08-14 NOTE — Telephone Encounter (Signed)
Follow Up       Pt's daughter calling stating that pt was told she would have a pre-op appt w/ Dr. Rayann Heman and there isn't anything scheduled for pt. Please call back and advise.

## 2014-08-14 NOTE — Telephone Encounter (Signed)
Patient daughter called in stating mother's weight was up to 225.8lbs she had to increase her oxygen over the weekend and has a cough intermittently.  Patient doesn't feel she is having any more shortness of breath than normal but is concerned about the weight gain.  Last weight in clinic was 220lbs (with clothing). Discussed with Roderic Palau, NP -- to take extra dose of torsemide 20 mg now but if in the morning her weight has not decreased and breathing is not better she should call Dr. Doug Sou office for further evaluation. [name] was in agreement with this plan.

## 2014-08-14 NOTE — Telephone Encounter (Signed)
Spoke with daughter and let her know her lab appointment is for 8/9 at 10:00am ans she will get an instruction sheet for procedure with dates and times on that day

## 2014-08-15 ENCOUNTER — Encounter: Payer: Self-pay | Admitting: Internal Medicine

## 2014-08-17 ENCOUNTER — Ambulatory Visit (INDEPENDENT_AMBULATORY_CARE_PROVIDER_SITE_OTHER): Payer: Medicare HMO

## 2014-08-17 DIAGNOSIS — R55 Syncope and collapse: Secondary | ICD-10-CM

## 2014-08-24 ENCOUNTER — Encounter: Payer: Self-pay | Admitting: *Deleted

## 2014-08-24 ENCOUNTER — Other Ambulatory Visit: Payer: Self-pay | Admitting: *Deleted

## 2014-08-24 DIAGNOSIS — I480 Paroxysmal atrial fibrillation: Secondary | ICD-10-CM

## 2014-08-29 NOTE — Progress Notes (Signed)
Loop recorder 

## 2014-08-30 ENCOUNTER — Telehealth (HOSPITAL_COMMUNITY): Payer: Self-pay | Admitting: *Deleted

## 2014-08-30 NOTE — Telephone Encounter (Signed)
pts daughter called in saying her mother was not feeling well shes been having diarrhea for the last several days and her BP and blood sugars are lower. Wondering if she may have a virus but wanted to check with cardiology as far as next steps. Instructed patients daughter to call and set up to see primary physician today to assess patient.  Daughter was in agreement with this plan. To call back if further problems.

## 2014-09-04 ENCOUNTER — Telehealth: Payer: Self-pay | Admitting: Internal Medicine

## 2014-09-04 ENCOUNTER — Other Ambulatory Visit (INDEPENDENT_AMBULATORY_CARE_PROVIDER_SITE_OTHER): Payer: Medicare HMO | Admitting: *Deleted

## 2014-09-04 DIAGNOSIS — E876 Hypokalemia: Secondary | ICD-10-CM

## 2014-09-04 DIAGNOSIS — I480 Paroxysmal atrial fibrillation: Secondary | ICD-10-CM

## 2014-09-04 LAB — BASIC METABOLIC PANEL
BUN: 17 mg/dL (ref 6–23)
CALCIUM: 9.5 mg/dL (ref 8.4–10.5)
CHLORIDE: 93 meq/L — AB (ref 96–112)
CO2: 33 meq/L — AB (ref 19–32)
CREATININE: 1.32 mg/dL — AB (ref 0.40–1.20)
GFR: 42.05 mL/min — AB (ref >60.00)
GLUCOSE: 169 mg/dL — AB (ref 70–99)
Potassium: 3.4 mEq/L — ABNORMAL LOW (ref 3.5–5.1)
Sodium: 139 mEq/L (ref 135–145)

## 2014-09-04 LAB — CBC WITH DIFFERENTIAL/PLATELET
Basophils Absolute: 0 10*3/uL (ref 0.0–0.1)
Basophils Relative: 0.4 % (ref 0.0–3.0)
Eosinophils Absolute: 0.1 10*3/uL (ref 0.0–0.7)
Eosinophils Relative: 1.4 % (ref 0.0–5.0)
HCT: 34 % — ABNORMAL LOW (ref 36.0–46.0)
HEMOGLOBIN: 10.5 g/dL — AB (ref 12.0–15.0)
LYMPHS ABS: 3.2 10*3/uL (ref 0.7–4.0)
LYMPHS PCT: 33.5 % (ref 12.0–46.0)
MCHC: 31 g/dL (ref 30.0–36.0)
MCV: 73.3 fl — ABNORMAL LOW (ref 78.0–100.0)
MONOS PCT: 8.5 % (ref 3.0–12.0)
Monocytes Absolute: 0.8 10*3/uL (ref 0.1–1.0)
NEUTROS ABS: 5.3 10*3/uL (ref 1.4–7.7)
Neutrophils Relative %: 56.2 % (ref 43.0–77.0)
PLATELETS: 290 10*3/uL (ref 150.0–400.0)
RBC: 4.64 Mil/uL (ref 3.87–5.11)
RDW: 19.1 % — ABNORMAL HIGH (ref 11.5–15.5)
WBC: 9.5 10*3/uL (ref 4.0–10.5)

## 2014-09-04 NOTE — Telephone Encounter (Signed)
Pt is aware of K+3.4. Pt states takes potassium 20 mEq one tablet twice a day.Per protocol pt to take and extra dose of potassium 20 mEq by mouth for 2 days, today and tomorrow, and have BMET rechecked on Monday 8/15 in AM prior TEE. Pt verbalized understanding.  **Dr. Rayann Heman needs to call authorization to get permission for pt  to stay overnight.call authorization dept @ [phone] Pt would like to be approved for her to stay in the hospital overnight after the A-fib ablation. Tina @ [facility] states that the overnight stay can't be approve due to the way order was written by Dr. Marlou Porch

## 2014-09-04 NOTE — Telephone Encounter (Signed)
New message    Members # [id number] Leafy Ro @ [facility] (Phone # [phone] ext [phone]) called stating pt is to have procedure on August 15 @ 9am and second procedure on August 16 @ 5:30am Pt daughter is stating pt would have to be up by 3:30am on August 16 to make it to hospital and pt/pt family is wondering if doctor would allow pt to stay at the hospital overnight after the procedure on 8-15. Doctor needs to call authorization to get permission to stay overnight. Call authorization dept @ [phone] Per Otila Kluver @ [facility] is stating they cannot approve overnight stay due to way order was written by Dr Rayann Heman Per Otila Kluver if you do not reach her, her voicemail is confidential

## 2014-09-06 ENCOUNTER — Encounter: Payer: Self-pay | Admitting: Internal Medicine

## 2014-09-07 LAB — CUP PACEART REMOTE DEVICE CHECK: Date Time Interrogation Session: 20160812154453

## 2014-09-10 ENCOUNTER — Ambulatory Visit (HOSPITAL_COMMUNITY)
Admission: RE | Admit: 2014-09-10 | Discharge: 2014-09-10 | Disposition: A | Discharge status: Home / Self Care | Payer: Medicare HMO | Source: Ambulatory Visit | Attending: Cardiology | Admitting: Cardiology

## 2014-09-10 ENCOUNTER — Other Ambulatory Visit (HOSPITAL_COMMUNITY): Payer: Self-pay | Admitting: *Deleted

## 2014-09-10 ENCOUNTER — Ambulatory Visit (HOSPITAL_COMMUNITY)
Admission: RE | Admit: 2014-09-10 | Discharge: 2014-09-10 | Disposition: A | Discharge status: Home / Self Care | Payer: Medicare HMO | Source: Ambulatory Visit | Attending: Internal Medicine | Admitting: Internal Medicine

## 2014-09-10 ENCOUNTER — Encounter (HOSPITAL_COMMUNITY)
Admission: RE | Disposition: A | Discharge status: Home / Self Care | Payer: Self-pay | Source: Ambulatory Visit | Attending: Cardiology

## 2014-09-10 ENCOUNTER — Encounter (HOSPITAL_COMMUNITY): Payer: Self-pay | Admitting: *Deleted

## 2014-09-10 ENCOUNTER — Other Ambulatory Visit (INDEPENDENT_AMBULATORY_CARE_PROVIDER_SITE_OTHER): Payer: Medicare HMO | Admitting: *Deleted

## 2014-09-10 DIAGNOSIS — E1143 Type 2 diabetes mellitus with diabetic autonomic (poly)neuropathy: Secondary | ICD-10-CM | POA: Diagnosis not present

## 2014-09-10 DIAGNOSIS — I4891 Unspecified atrial fibrillation: Secondary | ICD-10-CM | POA: Diagnosis not present

## 2014-09-10 DIAGNOSIS — I480 Paroxysmal atrial fibrillation: Secondary | ICD-10-CM

## 2014-09-10 DIAGNOSIS — I429 Cardiomyopathy, unspecified: Secondary | ICD-10-CM | POA: Insufficient documentation

## 2014-09-10 DIAGNOSIS — Z7902 Long term (current) use of antithrombotics/antiplatelets: Secondary | ICD-10-CM | POA: Diagnosis not present

## 2014-09-10 DIAGNOSIS — M1990 Unspecified osteoarthritis, unspecified site: Secondary | ICD-10-CM | POA: Insufficient documentation

## 2014-09-10 DIAGNOSIS — K219 Gastro-esophageal reflux disease without esophagitis: Secondary | ICD-10-CM | POA: Diagnosis not present

## 2014-09-10 DIAGNOSIS — J45909 Unspecified asthma, uncomplicated: Secondary | ICD-10-CM | POA: Insufficient documentation

## 2014-09-10 DIAGNOSIS — I110 Hypertensive heart disease with heart failure: Secondary | ICD-10-CM | POA: Insufficient documentation

## 2014-09-10 DIAGNOSIS — K589 Irritable bowel syndrome without diarrhea: Secondary | ICD-10-CM | POA: Diagnosis not present

## 2014-09-10 DIAGNOSIS — Z6841 Body Mass Index (BMI) 40.0 and over, adult: Secondary | ICD-10-CM | POA: Insufficient documentation

## 2014-09-10 DIAGNOSIS — I739 Peripheral vascular disease, unspecified: Secondary | ICD-10-CM | POA: Insufficient documentation

## 2014-09-10 DIAGNOSIS — I252 Old myocardial infarction: Secondary | ICD-10-CM | POA: Insufficient documentation

## 2014-09-10 DIAGNOSIS — F329 Major depressive disorder, single episode, unspecified: Secondary | ICD-10-CM | POA: Diagnosis not present

## 2014-09-10 DIAGNOSIS — F419 Anxiety disorder, unspecified: Secondary | ICD-10-CM | POA: Insufficient documentation

## 2014-09-10 DIAGNOSIS — D649 Anemia, unspecified: Secondary | ICD-10-CM | POA: Diagnosis not present

## 2014-09-10 DIAGNOSIS — K3184 Gastroparesis: Secondary | ICD-10-CM | POA: Insufficient documentation

## 2014-09-10 DIAGNOSIS — E876 Hypokalemia: Secondary | ICD-10-CM | POA: Diagnosis not present

## 2014-09-10 DIAGNOSIS — Z79899 Other long term (current) drug therapy: Secondary | ICD-10-CM | POA: Insufficient documentation

## 2014-09-10 DIAGNOSIS — I481 Persistent atrial fibrillation: Secondary | ICD-10-CM | POA: Insufficient documentation

## 2014-09-10 DIAGNOSIS — I484 Atypical atrial flutter: Secondary | ICD-10-CM | POA: Diagnosis not present

## 2014-09-10 DIAGNOSIS — E559 Vitamin D deficiency, unspecified: Secondary | ICD-10-CM | POA: Insufficient documentation

## 2014-09-10 DIAGNOSIS — E039 Hypothyroidism, unspecified: Secondary | ICD-10-CM | POA: Insufficient documentation

## 2014-09-10 DIAGNOSIS — I2510 Atherosclerotic heart disease of native coronary artery without angina pectoris: Secondary | ICD-10-CM | POA: Insufficient documentation

## 2014-09-10 DIAGNOSIS — E785 Hyperlipidemia, unspecified: Secondary | ICD-10-CM | POA: Insufficient documentation

## 2014-09-10 DIAGNOSIS — I5032 Chronic diastolic (congestive) heart failure: Secondary | ICD-10-CM | POA: Diagnosis not present

## 2014-09-10 HISTORY — PX: TEE WITHOUT CARDIOVERSION: SHX5443

## 2014-09-10 LAB — BASIC METABOLIC PANEL
BUN: 20 mg/dL (ref 6–23)
CHLORIDE: 96 meq/L (ref 96–112)
CO2: 35 meq/L — AB (ref 19–32)
Calcium: 9.2 mg/dL (ref 8.4–10.5)
Creatinine, Ser: 1.39 mg/dL — ABNORMAL HIGH (ref 0.40–1.20)
GFR: 39.62 mL/min — ABNORMAL LOW (ref >60.00)
Glucose, Bld: 99 mg/dL (ref 70–99)
Potassium: 3.5 mEq/L (ref 3.5–5.1)
Sodium: 143 mEq/L (ref 135–145)

## 2014-09-10 LAB — GLUCOSE, CAPILLARY: Glucose-Capillary: 104 mg/dL — ABNORMAL HIGH (ref 65–99)

## 2014-09-10 SURGERY — ECHOCARDIOGRAM, TRANSESOPHAGEAL
Anesthesia: Moderate Sedation

## 2014-09-10 MED ORDER — SODIUM CHLORIDE 0.9 % IV SOLN
INTRAVENOUS | Status: DC
  Administered 2014-09-10: 500 mL via INTRAVENOUS

## 2014-09-10 MED ORDER — MIDAZOLAM HCL 10 MG/2ML IJ SOLN
INTRAMUSCULAR | Status: DC | PRN
  Administered 2014-09-10 (×2): 2 mg via INTRAVENOUS

## 2014-09-10 MED ORDER — MIDAZOLAM HCL 5 MG/ML IJ SOLN
INTRAMUSCULAR | Status: AC
  Filled 2014-09-10: qty 2

## 2014-09-10 MED ORDER — DIPHENHYDRAMINE HCL 50 MG/ML IJ SOLN
INTRAMUSCULAR | Status: AC
  Filled 2014-09-10: qty 1

## 2014-09-10 MED ORDER — FENTANYL CITRATE (PF) 100 MCG/2ML IJ SOLN
INTRAMUSCULAR | Status: DC | PRN
  Administered 2014-09-10 (×2): 25 ug via INTRAVENOUS

## 2014-09-10 MED ORDER — FENTANYL CITRATE (PF) 100 MCG/2ML IJ SOLN
INTRAMUSCULAR | Status: AC
  Filled 2014-09-10: qty 2

## 2014-09-10 NOTE — Anesthesia Preprocedure Evaluation (Addendum)
Anesthesia Evaluation  Patient identified by MRN, date of birth, ID band Patient awake    Reviewed: Allergy & Precautions, NPO status , Patient's Chart, lab work & pertinent test results  Airway Mallampati: II  TM Distance: >3 FB Neck ROM: Full    Dental  (+) Missing   Pulmonary asthma ,  breath sounds clear to auscultation        Cardiovascular hypertension, Pt. on medications and Pt. on home beta blockers + CAD, + Past MI, + Peripheral Vascular Disease and +CHF (EF 55%. Diastolic dysfunction) + dysrhythmias Atrial Fibrillation Rhythm:Regular Rate:Normal     Neuro/Psych Anxiety Depression negative neurological ROS     GI/Hepatic hiatal hernia, GERD-  ,  Endo/Other  diabetes, Type 2, Oral Hypoglycemic AgentsHypothyroidism Morbid obesity  Renal/GU CRFRenal disease     Musculoskeletal  (+) Arthritis -,   Abdominal   Peds  Hematology  (+) anemia ,   Anesthesia Other Findings   Reproductive/Obstetrics                          Lab Results  Component Value Date   WBC 9.5 09/04/2014   HGB 10.5* 09/04/2014   HCT 34.0* 09/04/2014   MCV 73.3* 09/04/2014   PLT 290.0 09/04/2014   Lab Results  Component Value Date   CREATININE 1.39* 09/10/2014   BUN 20 09/10/2014   NA 143 09/10/2014   K 3.5 09/10/2014   CL 96 09/10/2014   CO2 35* 09/10/2014    Anesthesia Physical Anesthesia Plan  ASA: III  Anesthesia Plan: General   Post-op Pain Management:    Induction: Intravenous  Airway Management Planned: Oral ETT  Additional Equipment:   Intra-op Plan:   Post-operative Plan: Extubation in OR  Informed Consent: I have reviewed the patients History and Physical, chart, labs and discussed the procedure including the risks, benefits and alternatives for the proposed anesthesia with the patient or authorized representative who has indicated his/her understanding and acceptance.     Plan  Discussed with: CRNA  Anesthesia Plan Comments:        Anesthesia Quick Evaluation

## 2014-09-10 NOTE — CV Procedure (Signed)
Procedure: TEE  Indication: atrial fibrillation, pre-ablation.  Sedation: Versed 4 mg IV, Fentanyl 50 mcg IV  Findings: Please see echo section for full report.  Normal LV size and systolic function, EF [id number].  Normal wall motion.  Normal RV size and systolic function.  There was moderate right atrial enlargement and moderate to severe left atrial enlargement.  No LAA thrombus.  Trivial MR.  Trivial TR.  Mildly calcified and trileaflet aortic valve with no AI and no AS.  Negative bubble study, no PFO or ASD.  Grade III plaque in descending thoracic aorta.    Should be ok for atrial fibrillation ablation tomorrow.   [name] 09/10/2014 10:11 AM

## 2014-09-10 NOTE — Discharge Instructions (Signed)
Transesophageal Echocardiogram °Transesophageal echocardiography (TEE) is a picture test of your heart using sound waves. The pictures taken can give very detailed pictures of your heart. This can help your doctor see if there are problems with your heart. TEE can check: °· If your heart has blood clots in it. °· How well your heart valves are working. °· If you have an infection on the inside of your heart. °· Some of the major arteries of your heart. °· If your heart valve is working after a repair. °· Your heart before a procedure that uses a shock to your heart to get the rhythm back to normal. °BEFORE THE PROCEDURE °· Do not eat or drink for 6 hours before the procedure or as told by your doctor. °· Make plans to have someone drive you home after the procedure. Do not drive yourself home. °· An IV tube will be put in your arm. °PROCEDURE °· You will be given a medicine to help you relax (sedative). It will be given through the IV tube. °· A numbing medicine will be sprayed or gargled in the back of your throat to help numb it. °· The tip of the probe is placed into the back of your mouth. You will be asked to swallow. This helps to pass the probe into your esophagus. °· Once the tip of the probe is in the right place, your doctor can take pictures of your heart. °· You may feel pressure at the back of your throat. °AFTER THE PROCEDURE °· You will be taken to a recovery area so the sedative can wear off. °· Your throat may be sore and scratchy. This will go away slowly over time. °· You will go home when you are fully awake and able to swallow liquids. °· You should have someone stay with you for the next 24 hours. °· Do not drive or operate machinery for the next 24 hours. °Document Released: 11/09/2008 Document Revised: 01/17/2013 Document Reviewed: 07/14/2012 °ExitCare® Patient Information ©2015 [employer]. This information is not intended to replace advice given to you by your health care provider. Make  sure you discuss any questions you have with your health care provider. ° °

## 2014-09-10 NOTE — H&P (Addendum)
Physician History and Physical    [name] MRN: [id number] DOB/AGE: 1940-02-15 74 y.o. Admit date: 09/10/2014  Primary Cardiologist: Allred  HPI: 74 yo with history of chronic diastolic CHF and paroxysmal atrial fibrillation reports today for TEE prior to atrial fibrillation ablation tomorrow.  Symptomatically no change compared to last visit with Roderic Palau.  She is back in atrial fibrillation today.   Review of systems complete and found to be negative unless listed above   Past Medical History  Diagnosis Date  . Hypertensive heart disease   . Hyperlipidemia   . Hypothyroidism   . Obesity (BMI 30-39.9)   . GERD (gastroesophageal reflux disease)   . Degenerative disc disease, cervical   . Asthma   . Vitamin D deficiency   . Depression   . Bell's palsy   . Gastroparesis   . Internal hemorrhoid   . Hiatal hernia   . Anxiety   . Osteoarthritis   . Adenomatous colon polyp 02/13/09  . Diverticulosis   . Status post dilation of esophageal narrowing   . IBS (irritable bowel syndrome)   . PAF (paroxysmal atrial fibrillation)     a. chronic xarelto   . Diabetes mellitus without complication   . Diverticulitis   . Syncope     a. 12/2012: MDT Reveal LINQ ILR placed;  b. 12/2012 Echo: EF 45-50%, Gr 3 DD, mild MR, mildly dil LA;  c. 12/2012 Carotid U/S: 1-39% bilat ICA stenosis.  Marland Kitchen CAD (coronary artery disease)     a. 12/2012 Nonobstructive by cath: LM nl, LAD 50p/m, LCX 50-26m (FFR 0.93), RCA min irregs, EF 55-65%-->Med Rx. b. L&RHC 03/21/2014 EF 50-55%, 50% eccentric LCx stenosis with negative FFR, 40% ostial RCA stenosis, 40-50% mid LAD stenosis   . Neuropathy   . Atrial flutter     a. By ILR interrogation.  Marland Kitchen NICM (nonischemic cardiomyopathy)     a. EF 45% with grade 3 diastolic dysfunction by echo 12/2012.     Family History  Problem Relation Age of Onset  . Heart attack Mother   . Diabetes Mother   . Colon cancer Father   . Esophageal cancer Father   .  Kidney cancer Father   . Diabetes Father   . Ovarian cancer Sister   . Liver cancer Sister   . Breast cancer Sister   . Colon cancer Son   . Colon polyps Son   . Diabetes Sister   . Irritable bowel syndrome Sister   . Myocarditis Brother   . Rectal cancer Neg Hx   . Stomach cancer Neg Hx     Social History   Social History  . Marital Status: Divorced    Spouse Name: N/A  . Number of Children: 2  . Years of Education: N/A   Occupational History  . Retired    Social History Main Topics  . Smoking status: Never Smoker   . Smokeless tobacco: Never Used  . Alcohol Use: No  . Drug Use: No  . Sexual Activity: No   Other Topics Concern  . Not on file   Social History Narrative   ** Merged History Encounter **       Divorced   3 children, 1 deceased     Prescriptions prior to admission  Medication Sig Dispense Refill Last Dose  . cetirizine (ZYRTEC) 10 MG tablet Take 10 mg by mouth daily.   Past Week at 0700  . Cholecalciferol 5000 UNITS capsule Take 5,000 Units by  mouth daily.   09/10/2014 at 0630  . clonazePAM (KLONOPIN) 0.5 MG tablet Take 0.25-0.5 mg by mouth 3 (three) times daily as needed for anxiety.    09/09/2014 at 2300  . diltiazem (CARDIZEM CD) 240 MG 24 hr capsule Take 1 capsule (240 mg total) by mouth daily. 30 capsule 2 09/10/2014 at 0630  . diphenhydrAMINE (BENADRYL) 25 MG tablet Take 25 mg by mouth every 6 (six) hours as needed for itching or allergies.    Past Month at Unknown time  . estradiol (ESTRACE) 1 MG tablet Take 1 mg by mouth daily.   09/10/2014 at 0630  . glipiZIDE (GLUCOTROL XL) 10 MG 24 hr tablet Take 10 mg by mouth daily with breakfast.   09/10/2014 at 0630  . Insulin Glargine (LANTUS) 100 UNIT/ML Solostar Pen Inject 35 Units into the skin 2 (two) times daily.    09/09/2014 at 1800  . lansoprazole (PREVACID) 30 MG capsule Take 1 capsule (30 mg total) by mouth daily at 12 noon. (Patient taking differently: Take 30 mg by mouth every morning. ) 30 capsule  11 09/10/2014 at 0630  . levothyroxine (SYNTHROID, LEVOTHROID) 137 MCG tablet Take 137 mcg by mouth daily before breakfast.   09/10/2014 at 0630  . magnesium oxide (MAG-OX) 400 MG tablet Take 400 mg by mouth 4 (four) times daily.    09/10/2014 at 0630  . metFORMIN (GLUCOPHAGE-XR) 500 MG 24 hr tablet Take 1 tablet (500 mg total) by mouth 2 (two) times daily. 60 tablet 11 09/09/2014 at 1800  . metoprolol (LOPRESSOR) 50 MG tablet Take 1 tablet (50 mg total) by mouth 2 (two) times daily.   09/09/2014 at 0630  . rivaroxaban (XARELTO) 20 MG TABS tablet TAKE ONE (1) TABLET EACH DAY (Patient taking differently: Take 20 mg by mouth daily with supper. TAKE ONE (1) TABLET EACH DAY) 30 tablet 5 09/09/2014 at 1800  . simvastatin (ZOCOR) 20 MG tablet Take 20 mg by mouth daily.    09/09/2014 at 1800  . torsemide (DEMADEX) 20 MG tablet Take 3 tablets in am ( 60 ) mg and take 2 tablets in pm ( 40 ) mg. (Patient taking differently: Take 40-60 mg by mouth 2 (two) times daily. Take 60 mg by mouth in the morning and take 40 mg by mouth in the evening) 450 tablet 3 09/10/2014 at 0630  . traMADol (ULTRAM) 50 MG tablet Take 50-100 mg by mouth every 6 (six) hours as needed for moderate pain.    Past Week at Unknown time  . ACCU-CHEK SMARTVIEW test strip 1 each by Other route See admin instructions. Check blood sugar 3-4 times daily  11 Taking  . amiodarone (PACERONE) 200 MG tablet Take 1 tablet (200 mg total) by mouth daily. 180 tablet 3 09/10/2014 at 0630  . Potassium Chloride ER 20 MEQ TBCR Take 2 tablets qam and 1 tablet qpm (Patient taking differently: Take 20 mEq by mouth 2 (two) times daily. ) 90 tablet 1 Taking    Physical Exam: Blood pressure 127/65, pulse 72, temperature 98.2 F (36.8 C), temperature source Oral, resp. rate 16, height 5\' 2"  (1.575 m), weight 220 lb (99.791 kg), SpO2 95 %.  General: NAD Neck: Thick, no JVD, no thyromegaly or thyroid nodule.  Lungs: Clear to auscultation bilaterally with normal respiratory  effort. CV: Nondisplaced PMI.  Heart irregular S1/S2, no S3/S4, no murmur.  No peripheral edema.  No carotid bruit.  Normal pedal pulses.  Abdomen: Soft, nontender, no hepatosplenomegaly, no distention.  Skin:  Intact without lesions or rashes.  Neurologic: Alert and oriented x 3.  Psych: Normal affect. Extremities: No clubbing or cyanosis.  HEENT: Normal.   Labs:   Lab Results  Component Value Date   WBC 9.5 09/04/2014   HGB 10.5* 09/04/2014   HCT 34.0* 09/04/2014   MCV 73.3* 09/04/2014   PLT 290.0 09/04/2014    Recent Labs Lab 09/04/14 1027  NA 139  K 3.4*  CL 93*  CO2 33*  BUN 17  CREATININE 1.32*  CALCIUM 9.5  GLUCOSE 169*   Lab Results  Component Value Date   TROPONINI 0.04* 04/13/2014   No results found for: CHOL No results found for: HDL No results found for: LDLCALC No results found for: TRIG No results found for: CHOLHDL No results found for: LDLDIRECT    ASSESSMENT AND PLAN: 74 yo with history of chronic diastolic CHF and paroxysmal atrial fibrillation reports today for TEE prior to atrial fibrillation ablation tomorrow.  She is in atrial fibrillation today.   Signed: Loralie Champagne 09/10/2014, 9:44 AM

## 2014-09-11 ENCOUNTER — Ambulatory Visit (HOSPITAL_COMMUNITY): Payer: Medicare HMO | Admitting: Anesthesiology

## 2014-09-11 ENCOUNTER — Encounter (HOSPITAL_COMMUNITY)
Admission: RE | Disposition: A | Discharge status: Home / Self Care | Payer: Self-pay | Source: Ambulatory Visit | Attending: Internal Medicine

## 2014-09-11 ENCOUNTER — Ambulatory Visit (HOSPITAL_BASED_OUTPATIENT_CLINIC_OR_DEPARTMENT_OTHER)
Admission: RE | Admit: 2014-09-11 | Discharge: 2014-09-12 | Disposition: A | Discharge status: Home / Self Care | Payer: Medicare HMO | Source: Ambulatory Visit | Attending: Internal Medicine | Admitting: Internal Medicine

## 2014-09-11 ENCOUNTER — Encounter (HOSPITAL_COMMUNITY): Payer: Self-pay | Admitting: Anesthesiology

## 2014-09-11 DIAGNOSIS — I5032 Chronic diastolic (congestive) heart failure: Secondary | ICD-10-CM | POA: Diagnosis not present

## 2014-09-11 DIAGNOSIS — I481 Persistent atrial fibrillation: Secondary | ICD-10-CM

## 2014-09-11 DIAGNOSIS — I4891 Unspecified atrial fibrillation: Secondary | ICD-10-CM | POA: Diagnosis present

## 2014-09-11 DIAGNOSIS — I110 Hypertensive heart disease with heart failure: Secondary | ICD-10-CM | POA: Diagnosis not present

## 2014-09-11 DIAGNOSIS — I484 Atypical atrial flutter: Secondary | ICD-10-CM | POA: Diagnosis not present

## 2014-09-11 DIAGNOSIS — I483 Typical atrial flutter: Secondary | ICD-10-CM | POA: Diagnosis not present

## 2014-09-11 HISTORY — PX: ELECTROPHYSIOLOGIC STUDY: SHX172A

## 2014-09-11 LAB — POCT ACTIVATED CLOTTING TIME
ACTIVATED CLOTTING TIME: 288 s
ACTIVATED CLOTTING TIME: 325 s
ACTIVATED CLOTTING TIME: 190 s
Activated Clotting Time: 276 seconds
Activated Clotting Time: 294 seconds
Activated Clotting Time: 171 seconds

## 2014-09-11 LAB — GLUCOSE, CAPILLARY
GLUCOSE-CAPILLARY: 128 mg/dL — AB (ref 65–99)
GLUCOSE-CAPILLARY: 131 mg/dL — AB (ref 65–99)
Glucose-Capillary: 102 mg/dL — ABNORMAL HIGH (ref 65–99)

## 2014-09-11 LAB — MRSA PCR SCREENING: MRSA BY PCR: NEGATIVE

## 2014-09-11 SURGERY — ATRIAL FIBRILLATION ABLATION
Anesthesia: General

## 2014-09-11 MED ORDER — PROPOFOL 10 MG/ML IV BOLUS
INTRAVENOUS | Status: DC | PRN
  Administered 2014-09-11: 30 mg via INTRAVENOUS
  Administered 2014-09-11: 20 mg via INTRAVENOUS
  Administered 2014-09-11: 10 mg via INTRAVENOUS
  Administered 2014-09-11: 70 mg via INTRAVENOUS

## 2014-09-11 MED ORDER — IOHEXOL 350 MG/ML SOLN
INTRAVENOUS | Status: DC | PRN
  Administered 2014-09-11: 125 mL via INTRACARDIAC

## 2014-09-11 MED ORDER — ROCURONIUM BROMIDE 100 MG/10ML IV SOLN
INTRAVENOUS | Status: DC | PRN
  Administered 2014-09-11: 15 mg via INTRAVENOUS

## 2014-09-11 MED ORDER — SODIUM CHLORIDE 0.9 % IJ SOLN: 3.0000 mL | INTRAMUSCULAR | Status: DC | PRN

## 2014-09-11 MED ORDER — TRAMADOL HCL 50 MG PO TABS
50.0000 mg | ORAL_TABLET | Freq: Four times a day (QID) | ORAL | Status: DC | PRN
  Administered 2014-09-11: 100 mg via ORAL
  Administered 2014-09-12: 50 mg via ORAL
  Filled 2014-09-11: qty 2
  Filled 2014-09-11: qty 1

## 2014-09-11 MED ORDER — ONDANSETRON HCL 4 MG/2ML IJ SOLN
INTRAMUSCULAR | Status: DC | PRN
  Administered 2014-09-11: 4 mg via INTRAVENOUS

## 2014-09-11 MED ORDER — NEOSTIGMINE METHYLSULFATE 10 MG/10ML IV SOLN
INTRAVENOUS | Status: DC | PRN
  Administered 2014-09-11: 3 mg via INTRAVENOUS

## 2014-09-11 MED ORDER — PHENYLEPHRINE HCL 10 MG/ML IJ SOLN: INTRAMUSCULAR | Status: DC | PRN

## 2014-09-11 MED ORDER — SODIUM CHLORIDE 0.9 % IV SOLN: 250.0000 mL | INTRAVENOUS | Status: DC | PRN

## 2014-09-11 MED ORDER — GLYCOPYRROLATE 0.2 MG/ML IJ SOLN
INTRAMUSCULAR | Status: DC | PRN
  Administered 2014-09-11: 0.4 mg via INTRAVENOUS

## 2014-09-11 MED ORDER — SUCCINYLCHOLINE CHLORIDE 20 MG/ML IJ SOLN
INTRAMUSCULAR | Status: DC | PRN
  Administered 2014-09-11: 100 mg via INTRAVENOUS

## 2014-09-11 MED ORDER — FUROSEMIDE 10 MG/ML IJ SOLN
INTRAMUSCULAR | Status: AC
  Filled 2014-09-11: qty 4

## 2014-09-11 MED ORDER — FENTANYL CITRATE (PF) 100 MCG/2ML IJ SOLN
INTRAMUSCULAR | Status: DC | PRN
  Administered 2014-09-11: 50 ug via INTRAVENOUS

## 2014-09-11 MED ORDER — TORSEMIDE 20 MG PO TABS
40.0000 mg | ORAL_TABLET | Freq: Once | ORAL | Status: AC
  Administered 2014-09-11: 40 mg via ORAL
  Filled 2014-09-11: qty 2

## 2014-09-11 MED ORDER — BUPIVACAINE HCL (PF) 0.25 % IJ SOLN
INTRAMUSCULAR | Status: DC | PRN
  Administered 2014-09-11: 30 mL

## 2014-09-11 MED ORDER — ARTIFICIAL TEARS OP OINT
TOPICAL_OINTMENT | OPHTHALMIC | Status: DC | PRN
  Administered 2014-09-11: 1 via OPHTHALMIC

## 2014-09-11 MED ORDER — SODIUM CHLORIDE 0.9 % IJ SOLN
3.0000 mL | Freq: Two times a day (BID) | INTRAMUSCULAR | Status: DC
  Administered 2014-09-11: 3 mL via INTRAVENOUS

## 2014-09-11 MED ORDER — RIVAROXABAN 20 MG PO TABS
20.0000 mg | ORAL_TABLET | Freq: Every day | ORAL | Status: DC
  Administered 2014-09-11: 20 mg via ORAL
  Filled 2014-09-11 (×2): qty 1

## 2014-09-11 MED ORDER — LEVOTHYROXINE SODIUM 137 MCG PO TABS
137.0000 ug | ORAL_TABLET | Freq: Every day | ORAL | Status: DC
  Administered 2014-09-11 – 2014-09-12 (×2): 137 ug via ORAL
  Filled 2014-09-11 (×3): qty 1

## 2014-09-11 MED ORDER — CLONAZEPAM 0.5 MG PO TABS
0.2500 mg | ORAL_TABLET | Freq: Three times a day (TID) | ORAL | Status: DC | PRN
  Administered 2014-09-11: 0.5 mg via ORAL
  Filled 2014-09-11: qty 1

## 2014-09-11 MED ORDER — HEPARIN SODIUM (PORCINE) 1000 UNIT/ML IJ SOLN
INTRAMUSCULAR | Status: DC | PRN
  Administered 2014-09-11 (×3): 3000 [IU] via INTRAVENOUS
  Administered 2014-09-11: 12000 [IU] via INTRAVENOUS

## 2014-09-11 MED ORDER — GLIPIZIDE ER 10 MG PO TB24
10.0000 mg | ORAL_TABLET | Freq: Every day | ORAL | Status: DC
  Administered 2014-09-12: 10 mg via ORAL
  Filled 2014-09-11 (×2): qty 1

## 2014-09-11 MED ORDER — BUPIVACAINE HCL (PF) 0.25 % IJ SOLN
INTRAMUSCULAR | Status: AC
  Filled 2014-09-11: qty 30

## 2014-09-11 MED ORDER — LIDOCAINE HCL (CARDIAC) 20 MG/ML IV SOLN
INTRAVENOUS | Status: DC | PRN
  Administered 2014-09-11: 60 mg via INTRAVENOUS

## 2014-09-11 MED ORDER — LACTATED RINGERS IV SOLN
INTRAVENOUS | Status: DC | PRN
  Administered 2014-09-11 (×2): via INTRAVENOUS

## 2014-09-11 MED ORDER — PROTAMINE SULFATE 10 MG/ML IV SOLN
INTRAVENOUS | Status: DC | PRN
  Administered 2014-09-11: 30 mg via INTRAVENOUS

## 2014-09-11 MED ORDER — HEPARIN SODIUM (PORCINE) 1000 UNIT/ML IJ SOLN
INTRAMUSCULAR | Status: AC
  Filled 2014-09-11: qty 1

## 2014-09-11 MED ORDER — AMIODARONE HCL 200 MG PO TABS
200.0000 mg | ORAL_TABLET | Freq: Every day | ORAL | Status: DC
  Administered 2014-09-11 – 2014-09-12 (×2): 200 mg via ORAL
  Filled 2014-09-11 (×2): qty 1

## 2014-09-11 MED ORDER — INSULIN GLARGINE 100 UNIT/ML ~~LOC~~ SOLN
35.0000 [IU] | Freq: Two times a day (BID) | SUBCUTANEOUS | Status: DC
  Administered 2014-09-11 – 2014-09-12 (×2): 35 [IU] via SUBCUTANEOUS
  Filled 2014-09-11 (×3): qty 0.35

## 2014-09-11 MED ORDER — PHENYLEPHRINE HCL 10 MG/ML IJ SOLN
INTRAMUSCULAR | Status: DC | PRN
  Administered 2014-09-11: 40 ug via INTRAVENOUS

## 2014-09-11 MED ORDER — ACETAMINOPHEN 325 MG PO TABS
650.0000 mg | ORAL_TABLET | ORAL | Status: DC | PRN
  Administered 2014-09-12 (×2): 650 mg via ORAL
  Filled 2014-09-11 (×2): qty 2

## 2014-09-11 MED ORDER — ONDANSETRON HCL 4 MG/2ML IJ SOLN: 4.0000 mg | Freq: Four times a day (QID) | INTRAMUSCULAR | Status: DC | PRN

## 2014-09-11 SURGICAL SUPPLY — 22 items
BAG SNAP BAND KOVER 36X36 (MISCELLANEOUS) ×2 IMPLANT
BLANKET WARM UNDERBOD FULL ACC (MISCELLANEOUS) ×2 IMPLANT
CATH DIAG 6FR PIGTAIL (CATHETERS) ×2 IMPLANT
CATH NAVISTAR SMARTTOUCH DF (ABLATOR) ×2 IMPLANT
CATH SOUNDSTAR 3D IMAGING (CATHETERS) ×2 IMPLANT
CATH VARIABLE LASSO NAV 2515 (CATHETERS) ×1 IMPLANT
CATH WEBSTER BI DIR CS D-F CRV (CATHETERS) ×2 IMPLANT
COVER SWIFTLINK CONNECTOR (BAG) ×2 IMPLANT
NDL TRANSEP BRK 71CM 407200 (NEEDLE) ×1 IMPLANT
NEEDLE TRANSEP BRK 71CM 407200 (NEEDLE) ×2 IMPLANT
PACK EP LATEX FREE (CUSTOM PROCEDURE TRAY) ×2
PACK EP LF (CUSTOM PROCEDURE TRAY) ×1 IMPLANT
PAD DEFIB LIFELINK (PAD) ×2 IMPLANT
PATCH CARTO3 (PAD) ×2 IMPLANT
SHEATH AVANTI 11F 11CM (SHEATH) ×2 IMPLANT
SHEATH PINNACLE 7F 10CM (SHEATH) ×3 IMPLANT
SHEATH PINNACLE 9F 10CM (SHEATH) ×2 IMPLANT
SHEATH SWARTZ TS SL2 63CM 8.5F (SHEATH) ×2 IMPLANT
SHIELD RADPAD SCOOP 12X17 (MISCELLANEOUS) ×2 IMPLANT
SYR MEDRAD MARK V 150ML (SYRINGE) ×2 IMPLANT
TUBING CONTRAST HIGH PRESS 48 (TUBING) ×2 IMPLANT
TUBING SMART ABLATE COOLFLOW (TUBING) ×2 IMPLANT

## 2014-09-11 NOTE — Discharge Summary (Signed)
ELECTROPHYSIOLOGY PROCEDURE DISCHARGE SUMMARY    Patient ID: [name],  MRN: [id number], DOB/AGE: 1940/11/06 74 y.o.  Admit date: 09/11/2014 Discharge date: 09/11/2014  Primary Care Physician: Octavio Graves, DO Electrophysiologist: [name], MD  Primary Discharge Diagnosis:  Persistent atrial fibrillation and atrial flutter status post ablation this admission  Secondary Discharge Diagnosis:  1.  Hypertension 2.  Hypothyroidism 3.  Obesity 4.  GERD 5.  NICM 6.  CAD 7.  Diabetes  Procedures This Admission:  1.  Electrophysiology study and radiofrequency catheter ablation on 09/11/14 by Dr [name].  This study demonstrated atrial fibrillation upon presentation; rotational Angiography reveals a large sized left atrium with four separate pulmonary veins without evidence of pulmonary vein stenosis; successful electrical isolation and anatomical encircling of all four pulmonary veins with radiofrequency current. A WACA approach was used; cavo-tricuspid isthmus ablation was performed with complete bidirectional isthmus block achieved; ectopic atrial tachycardias were ablated in the left atrium. Additional left atrial tachycardia/ atypical atrial flutter was observed but could not be ablated due to paucity of electrograms within the left atrium and unstable tachycardia cycle length. The patient was therefore cardioverted to sinus rhythm; no early apparent complications.   Brief HPI: [name] is a 74 y.o. female with a history of persistent atrial fibrillation.  They have failed medical therapy with Tikosyn and amiodarone.  Risks, benefits, and alternatives to catheter ablation of atrial fibrillation were reviewed with the patient who wished to proceed.  The patient underwent TEE prior to the procedure which demonstrated normal LV function and no LAA thrombus.    Hospital Course:  The patient was admitted and underwent EPS/RFCA of atrial fibrillation with details  as outlined above.  They were monitored on telemetry overnight which demonstrated brief nonsustained afib.  Groin was without complication on the day of discharge.  The patient was examined and considered to be stable for discharge.  Wound care and restrictions were reviewed with the patient.  The patient will be seen back by the AF clinic in 4 weeks and Dr Rayann Heman in 12 weeks for post ablation follow up.   This patients CHA2DS2-VASc Score is at least 5.    Physical Exam: Filed Vitals:   09/11/14 1600 09/11/14 1615 09/11/14 1630 09/11/14 1700  BP: 100/42 100/41 103/36 107/45  Pulse: 74 75 74 77  Temp:      TempSrc:      Resp: 16 20 20 20   Height:      Weight:      SpO2: 99% 98% 100% 96%    GEN- The patient is well appearing, alert and oriented x 3 today.   HEENT: normocephalic, atraumatic; sclera clear, conjunctiva pink; hearing intact; oropharynx clear; neck supple, no JVP Lymph- no cervical lymphadenopathy Lungs- Clear to ausculation bilaterally, normal work of breathing.  No wheezes, rales, rhonchi Heart- Regular rate and rhythm, no murmurs, rubs or gallops, PMI not laterally displaced GI- soft, non-tender, non-distended, bowel sounds present, no hepatosplenomegaly Extremities- no clubbing, cyanosis, or edema; DP/PT/radial pulses 2+ bilaterally, groin without hematoma/bruit MS- no significant deformity or atrophy Skin- warm and dry, no rash or lesion Psych- euthymic mood, full affect Neuro- strength and sensation are intact   Labs:   Lab Results  Component Value Date   WBC 9.5 09/04/2014   HGB 10.5* 09/04/2014   HCT 34.0* 09/04/2014   MCV 73.3* 09/04/2014   PLT 290.0 09/04/2014    Recent Labs Lab 09/10/14 0810  NA 143  K 3.5  CL 96  CO2 35*  BUN 20  CREATININE 1.39*  CALCIUM 9.2  GLUCOSE 99     Discharge Medications:    Medication List    ASK your doctor about these medications        ACCU-CHEK SMARTVIEW test strip  Generic drug:  glucose blood  1 each  by Other route See admin instructions. Check blood sugar 3-4 times daily     amiodarone 200 MG tablet  Commonly known as:  PACERONE  Take 1 tablet (200 mg total) by mouth daily.     cetirizine 10 MG tablet  Commonly known as:  ZYRTEC  Take 10 mg by mouth daily.     Cholecalciferol 5000 UNITS capsule  Take 5,000 Units by mouth daily.     clonazePAM 0.5 MG tablet  Commonly known as:  KLONOPIN  Take 0.25-0.5 mg by mouth 3 (three) times daily as needed for anxiety.     diltiazem 240 MG 24 hr capsule  Commonly known as:  CARDIZEM CD  Take 1 capsule (240 mg total) by mouth daily.     diphenhydrAMINE 25 MG tablet  Commonly known as:  BENADRYL  Take 25 mg by mouth every 6 (six) hours as needed for itching or allergies.     estradiol 1 MG tablet  Commonly known as:  ESTRACE  Take 1 mg by mouth daily.     glipiZIDE 10 MG 24 hr tablet  Commonly known as:  GLUCOTROL XL  Take 10 mg by mouth daily with breakfast.     Insulin Glargine 100 UNIT/ML Solostar Pen  Commonly known as:  LANTUS  Inject 35 Units into the skin 2 (two) times daily.     lansoprazole 30 MG capsule  Commonly known as:  PREVACID  Take 1 capsule (30 mg total) by mouth daily at 12 noon.     levothyroxine 137 MCG tablet  Commonly known as:  SYNTHROID, LEVOTHROID  Take 137 mcg by mouth daily before breakfast.     magnesium oxide 400 MG tablet  Commonly known as:  MAG-OX  Take 400 mg by mouth 4 (four) times daily.     metFORMIN 500 MG 24 hr tablet  Commonly known as:  GLUCOPHAGE-XR  Take 1 tablet (500 mg total) by mouth 2 (two) times daily.     metoprolol 50 MG tablet  Commonly known as:  LOPRESSOR  Take 1 tablet (50 mg total) by mouth 2 (two) times daily.     Potassium Chloride ER 20 MEQ Tbcr  Take 2 tablets qam and 1 tablet qpm     rivaroxaban 20 MG Tabs tablet  Commonly known as:  XARELTO  TAKE ONE (1) TABLET EACH DAY     simvastatin 20 MG tablet  Commonly known as:  ZOCOR  Take 20 mg by mouth  daily.     torsemide 20 MG tablet  Commonly known as:  DEMADEX  Take 3 tablets in am ( 60 ) mg and take 2 tablets in pm ( 40 ) mg.     traMADol 50 MG tablet  Commonly known as:  ULTRAM  Take 50-100 mg by mouth every 6 (six) hours as needed for moderate pain.        Disposition:       Follow-up Information    Follow up with [facility] On 10/09/2014.   Why:  at 9:30AM   Contact information:   [address] [place] [phone]      Follow  up with [name], MD On 12/12/2014.   Specialty:  Cardiology   Why:  at 8:45AM   Contact information:   [place] [id number] [phone]       Duration of Discharge Encounter: Greater than 30 minutes including physician time.  Signed, [name], NP 09/11/2014 5:41 PM    I have seen, examined the patient, and reviewed the above assessment and plan. On exam, RRR. Comfortable. Changes to above are made where necessary.    Co Sign: [name], MD 09/12/2014 9:32 AM
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
## 2014-09-11 NOTE — Progress Notes (Signed)
Patient having episode of tachycardia HR up to the 120's-140's non sustained and back to sinus again multiple times,  BP stable. Pt. Felt palpitation of her heart and some slight short of breath. O2 sats. 95-98% at 3l [place]. Family at the bedside, appears patient slightly anxious.  Dr. Alejandro Mulling made aware of patients HR and symptoms with order to give some medicine to relax patient. Klonopin given as ordered PRN. Will continue to monitor patient.

## 2014-09-11 NOTE — Anesthesia Procedure Notes (Signed)
Procedure Name: Intubation Date/Time: 09/11/2014 8:10 AM Performed by: Scheryl Darter Pre-anesthesia Checklist: Patient identified, Emergency Drugs available, Suction available, Patient being monitored and Timeout performed Patient Re-evaluated:Patient Re-evaluated prior to inductionOxygen Delivery Method: Circle system utilized Preoxygenation: Pre-oxygenation with 100% oxygen Intubation Type: IV induction Ventilation: Mask ventilation without difficulty Laryngoscope Size: Mac and 3 Grade View: Grade I Tube type: Oral Tube size: 7.5 mm Number of attempts: 1 Airway Equipment and Method: Stylet Placement Confirmation: ETT inserted through vocal cords under direct vision,  positive ETCO2 and breath sounds checked- equal and bilateral Secured at: 22 cm Tube secured with: Tape Dental Injury: Teeth and Oropharynx as per pre-operative assessment

## 2014-09-11 NOTE — Progress Notes (Signed)
Patient continues to have upper end expiratory wheezing. States she can't get her breath. Skin w/d.  O2 sat 96% 4L. Not using accessory muscles. Dr. Rayann Heman made aware. [name], NP by to see patient. Pharmacy called to send Torsemide

## 2014-09-11 NOTE — Progress Notes (Signed)
Settled down some, not as short of breath. Loose BM. Cleaned, lines changed. Rechecking ACT

## 2014-09-11 NOTE — Anesthesia Postprocedure Evaluation (Signed)
  Anesthesia Post-op Note  Patient: [name]  Procedure(s) Performed: Procedure(s): Atrial Fibrillation Ablation (N/A)  Patient Location: PACU  Anesthesia Type:General  Level of Consciousness: awake and alert   Airway and Oxygen Therapy: Patient Spontanous Breathing  Post-op Pain: none  Post-op Assessment: Post-op Vital signs reviewed LLE Motor Response: Purposeful movement   RLE Motor Response: Purposeful movement        Post-op Vital Signs: Reviewed  Last Vitals:  Filed Vitals:   09/11/14 1700  BP: 107/45  Pulse: 77  Temp:   Resp: 20    Complications: No apparent anesthesia complications

## 2014-09-11 NOTE — Progress Notes (Addendum)
Site area: rt groin 3 fv sheaths Site Prior to Removal:  Level  0 Pressure Applied For:  20 minutes Manual:   yes Patient Status During Pull:  stable Post Pull Site:  Level   0 Post Pull Instructions Given:  yes Post Pull Pulses Present: yes Dressing Applied:  Small tegaderm Bedrest begins @   1400 Comments:  0. IV saline locked.

## 2014-09-11 NOTE — Transfer of Care (Signed)
Immediate Anesthesia Transfer of Care Note  Patient: [name]  Procedure(s) Performed: Procedure(s): Atrial Fibrillation Ablation (N/A)  Patient Location: PACU and Cath Lab  Anesthesia Type:General  Level of Consciousness: awake  Airway & Oxygen Therapy: Patient Spontanous Breathing and Patient connected to face mask oxygen  Post-op Assessment: Report given to RN and Post -op Vital signs reviewed and stable  Post vital signs: Reviewed and stable  Last Vitals:  Filed Vitals:   09/11/14 0541  BP: 110/68  Pulse: 83  Temp: 36.9 C  Resp: 18    Complications: No apparent anesthesia complications

## 2014-09-11 NOTE — Progress Notes (Signed)
Admitted from the cath. Lab by bed awake and alert, breathing easy and regular. Instructed not to bend right knee and to notify staff for any signs of bleeding. Bedrest till 8pm.

## 2014-09-11 NOTE — Progress Notes (Signed)
Face mask removed and placed on 6L [place].

## 2014-09-11 NOTE — Discharge Instructions (Signed)
No driving for 4 days. No lifting over 5 lbs for 1 week. No sexual activity for 1 week. You may return to work in 1 week. Keep procedure site clean & dry. If you notice increased pain, swelling, bleeding or pus, call/return!  You may shower, but no soaking baths/hot tubs/pools for 1 week.  ° ° °

## 2014-09-12 ENCOUNTER — Encounter: Payer: Self-pay | Admitting: Internal Medicine

## 2014-09-12 ENCOUNTER — Encounter (HOSPITAL_COMMUNITY): Payer: Self-pay | Admitting: Internal Medicine

## 2014-09-12 ENCOUNTER — Telehealth: Payer: Self-pay | Admitting: Internal Medicine

## 2014-09-12 DIAGNOSIS — I481 Persistent atrial fibrillation: Secondary | ICD-10-CM | POA: Diagnosis not present

## 2014-09-12 DIAGNOSIS — I5032 Chronic diastolic (congestive) heart failure: Secondary | ICD-10-CM | POA: Diagnosis not present

## 2014-09-12 DIAGNOSIS — I484 Atypical atrial flutter: Secondary | ICD-10-CM | POA: Diagnosis not present

## 2014-09-12 DIAGNOSIS — I110 Hypertensive heart disease with heart failure: Secondary | ICD-10-CM | POA: Diagnosis not present

## 2014-09-12 LAB — BASIC METABOLIC PANEL
Anion gap: 9 (ref 5–15)
BUN: 12 mg/dL (ref 6–20)
CHLORIDE: 97 mmol/L — AB (ref 101–111)
CO2: 36 mmol/L — ABNORMAL HIGH (ref 22–32)
CREATININE: 1.31 mg/dL — AB (ref 0.44–1.00)
Calcium: 8.4 mg/dL — ABNORMAL LOW (ref 8.9–10.3)
GFR calc Af Amer: 46 mL/min — ABNORMAL LOW (ref >60)
GFR, EST NON AFRICAN AMERICAN: 40 mL/min — AB (ref >60)
GLUCOSE: 137 mg/dL — AB (ref 65–99)
Potassium: 3.8 mmol/L (ref 3.5–5.1)
SODIUM: 142 mmol/L (ref 135–145)

## 2014-09-12 LAB — GLUCOSE, CAPILLARY
Glucose-Capillary: 150 mg/dL — ABNORMAL HIGH (ref 65–99)
Glucose-Capillary: 151 mg/dL — ABNORMAL HIGH (ref 65–99)

## 2014-09-12 NOTE — Telephone Encounter (Signed)
New message      Pt had an ablation yesterday.  Today at 1:43pm pt weighed 228 lbs and now at 4:50pm, pt weighed 230.6.  Should they be concerned?

## 2014-09-12 NOTE — Progress Notes (Signed)
Patient given discharge instructions and signed copy of discharge paperwork placed in chart. Patient has no questions or concerns at this time. Patient discharged from hospital and accompanied out in wheelchair with family.

## 2014-09-13 NOTE — Telephone Encounter (Signed)
Spoke with patient 8/17 and let her know that your weight tends to go up in the afternoons but I would touch base with her on 09/13/14 to see how she was.  I have called her and no answer or voicemail.  Will try again on Mon

## 2014-09-14 ENCOUNTER — Encounter: Payer: Self-pay | Admitting: Internal Medicine

## 2014-09-14 ENCOUNTER — Telehealth (HOSPITAL_COMMUNITY): Payer: Self-pay | Admitting: Nurse Practitioner

## 2014-09-14 NOTE — Telephone Encounter (Signed)
Daughter called and said [name] feet were swollen and weight up. She usually takes torsemide 3 in am and 2 in pm. For the next 3 days or until swelling improves, increase to 3 in the pm.

## 2014-09-17 ENCOUNTER — Ambulatory Visit (INDEPENDENT_AMBULATORY_CARE_PROVIDER_SITE_OTHER): Payer: Medicare HMO | Admitting: *Deleted

## 2014-09-17 DIAGNOSIS — I480 Paroxysmal atrial fibrillation: Secondary | ICD-10-CM

## 2014-09-17 NOTE — Telephone Encounter (Signed)
Spoke with patient and her swelling is better but she is c/o her throat being sore and some burning in chest.  She is taking her PPI daily.  I have discussed with Dr Rayann Heman and he says this should continue to get better daily.  She has a follow up with Roderic Palau, NP Wed.

## 2014-09-18 ENCOUNTER — Encounter: Payer: Self-pay | Admitting: *Deleted

## 2014-09-18 NOTE — Progress Notes (Signed)
AF episodes from 09/12/14, 8/18, 8/21, and 8/22 printed for review.  Episodes appropriate, history of atrial fibrillation w/ablation on 09/11/14, +Xarelto. Monthly summary reports and ROV with Roderic Palau, NP on 09/19/2014 at 1:30pm and Dr. Rayann Heman on 12/12/2014 at 8:45am.

## 2014-09-19 ENCOUNTER — Encounter (HOSPITAL_COMMUNITY): Payer: Self-pay | Admitting: Nurse Practitioner

## 2014-09-19 ENCOUNTER — Ambulatory Visit (HOSPITAL_COMMUNITY)
Admission: RE | Admit: 2014-09-19 | Discharge: 2014-09-19 | Disposition: A | Discharge status: Home / Self Care | Payer: Medicare HMO | Source: Ambulatory Visit | Attending: Nurse Practitioner | Admitting: Nurse Practitioner

## 2014-09-19 VITALS — BP 112/70 | HR 60 | Ht 62.0 in | Wt 223.0 lb

## 2014-09-19 DIAGNOSIS — I481 Persistent atrial fibrillation: Secondary | ICD-10-CM

## 2014-09-19 DIAGNOSIS — I429 Cardiomyopathy, unspecified: Secondary | ICD-10-CM | POA: Insufficient documentation

## 2014-09-19 DIAGNOSIS — I10 Essential (primary) hypertension: Secondary | ICD-10-CM | POA: Insufficient documentation

## 2014-09-19 DIAGNOSIS — I4819 Other persistent atrial fibrillation: Secondary | ICD-10-CM

## 2014-09-19 NOTE — Progress Notes (Signed)
Loop recorder 
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
## 2014-09-24 LAB — CUP PACEART REMOTE DEVICE CHECK: Date Time Interrogation Session: 20160829121831

## 2014-10-02 ENCOUNTER — Ambulatory Visit: Payer: Medicare HMO | Admitting: Cardiology

## 2014-10-03 ENCOUNTER — Encounter: Payer: Self-pay | Admitting: Internal Medicine

## 2014-10-04 ENCOUNTER — Ambulatory Visit: Payer: Medicare HMO | Admitting: Cardiology

## 2014-10-08 ENCOUNTER — Encounter: Payer: Self-pay | Admitting: *Deleted

## 2014-10-08 NOTE — Progress Notes (Signed)
Cardiac compass printed for review.  AF daily burden > 6hr threshold for past 2-3 days (with another episode currently in progress).  History of AF w/ablation on 09/11/14, +Xarelto.  V rates controlled, +metoprolol.  Monthly summary reports and ROV with AF Clinic on 10/09/14 at 9:30am and with Dr. Rayann Heman on 12/12/14 at 8:45am.

## 2014-10-09 ENCOUNTER — Ambulatory Visit (HOSPITAL_COMMUNITY)
Admission: RE | Admit: 2014-10-09 | Discharge: 2014-10-09 | Disposition: A | Discharge status: Home / Self Care | Payer: Medicare HMO | Source: Ambulatory Visit | Attending: Nurse Practitioner | Admitting: Nurse Practitioner

## 2014-10-09 ENCOUNTER — Encounter (HOSPITAL_COMMUNITY): Payer: Self-pay | Admitting: Nurse Practitioner

## 2014-10-09 VITALS — BP 114/68 | HR 76 | Ht 62.0 in | Wt 219.8 lb

## 2014-10-09 DIAGNOSIS — I509 Heart failure, unspecified: Secondary | ICD-10-CM | POA: Diagnosis not present

## 2014-10-09 DIAGNOSIS — R197 Diarrhea, unspecified: Secondary | ICD-10-CM | POA: Diagnosis not present

## 2014-10-09 DIAGNOSIS — I481 Persistent atrial fibrillation: Secondary | ICD-10-CM | POA: Diagnosis present

## 2014-10-09 DIAGNOSIS — I4819 Other persistent atrial fibrillation: Secondary | ICD-10-CM

## 2014-10-09 DIAGNOSIS — R112 Nausea with vomiting, unspecified: Secondary | ICD-10-CM | POA: Insufficient documentation

## 2014-10-09 DIAGNOSIS — I10 Essential (primary) hypertension: Secondary | ICD-10-CM | POA: Diagnosis not present
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
## 2014-10-09 NOTE — Patient Instructions (Signed)
Code 0900

## 2014-10-10 NOTE — Addendum Note (Signed)
Encounter addended by: Sherran Needs, NP on: 10/10/2014 10:04 AM<BR>     Documentation filed: Notes Section

## 2014-10-11 ENCOUNTER — Encounter: Payer: Self-pay | Admitting: Internal Medicine

## 2014-10-16 ENCOUNTER — Ambulatory Visit (INDEPENDENT_AMBULATORY_CARE_PROVIDER_SITE_OTHER): Payer: Medicare HMO | Admitting: *Deleted

## 2014-10-16 ENCOUNTER — Encounter: Payer: Self-pay | Admitting: Internal Medicine

## 2014-10-16 DIAGNOSIS — I480 Paroxysmal atrial fibrillation: Secondary | ICD-10-CM

## 2014-10-16 NOTE — Progress Notes (Signed)
Loop recorder 

## 2014-10-22 ENCOUNTER — Encounter (HOSPITAL_COMMUNITY): Payer: Self-pay | Admitting: Nurse Practitioner

## 2014-10-22 ENCOUNTER — Ambulatory Visit (HOSPITAL_BASED_OUTPATIENT_CLINIC_OR_DEPARTMENT_OTHER)
Admission: RE | Admit: 2014-10-22 | Discharge: 2014-10-22 | Disposition: A | Discharge status: Home / Self Care | Payer: Medicare HMO | Source: Ambulatory Visit | Attending: Nurse Practitioner | Admitting: Nurse Practitioner

## 2014-10-22 ENCOUNTER — Other Ambulatory Visit: Payer: Self-pay

## 2014-10-22 ENCOUNTER — Encounter (HOSPITAL_COMMUNITY): Payer: Self-pay

## 2014-10-22 ENCOUNTER — Emergency Department (HOSPITAL_COMMUNITY)
Admission: EM | Admit: 2014-10-22 | Discharge: 2014-10-23 | Disposition: A | Discharge status: Home / Self Care | Payer: Medicare HMO | Attending: Emergency Medicine | Admitting: Emergency Medicine

## 2014-10-22 VITALS — BP 126/84 | HR 78 | Ht 62.0 in | Wt 221.2 lb

## 2014-10-22 DIAGNOSIS — K429 Umbilical hernia without obstruction or gangrene: Secondary | ICD-10-CM | POA: Diagnosis not present

## 2014-10-22 DIAGNOSIS — E039 Hypothyroidism, unspecified: Secondary | ICD-10-CM | POA: Insufficient documentation

## 2014-10-22 DIAGNOSIS — R319 Hematuria, unspecified: Secondary | ICD-10-CM | POA: Diagnosis present

## 2014-10-22 DIAGNOSIS — K219 Gastro-esophageal reflux disease without esophagitis: Secondary | ICD-10-CM | POA: Diagnosis not present

## 2014-10-22 DIAGNOSIS — E785 Hyperlipidemia, unspecified: Secondary | ICD-10-CM | POA: Diagnosis not present

## 2014-10-22 DIAGNOSIS — J45909 Unspecified asthma, uncomplicated: Secondary | ICD-10-CM | POA: Insufficient documentation

## 2014-10-22 DIAGNOSIS — E119 Type 2 diabetes mellitus without complications: Secondary | ICD-10-CM | POA: Diagnosis not present

## 2014-10-22 DIAGNOSIS — I4892 Unspecified atrial flutter: Secondary | ICD-10-CM | POA: Diagnosis not present

## 2014-10-22 DIAGNOSIS — M1990 Unspecified osteoarthritis, unspecified site: Secondary | ICD-10-CM | POA: Insufficient documentation

## 2014-10-22 DIAGNOSIS — Z794 Long term (current) use of insulin: Secondary | ICD-10-CM | POA: Insufficient documentation

## 2014-10-22 DIAGNOSIS — I119 Hypertensive heart disease without heart failure: Secondary | ICD-10-CM | POA: Insufficient documentation

## 2014-10-22 DIAGNOSIS — I481 Persistent atrial fibrillation: Secondary | ICD-10-CM | POA: Diagnosis not present

## 2014-10-22 DIAGNOSIS — Z880 Allergy status to penicillin: Secondary | ICD-10-CM | POA: Diagnosis not present

## 2014-10-22 DIAGNOSIS — Z9889 Other specified postprocedural states: Secondary | ICD-10-CM | POA: Diagnosis not present

## 2014-10-22 DIAGNOSIS — F419 Anxiety disorder, unspecified: Secondary | ICD-10-CM | POA: Insufficient documentation

## 2014-10-22 DIAGNOSIS — Z79899 Other long term (current) drug therapy: Secondary | ICD-10-CM | POA: Insufficient documentation

## 2014-10-22 DIAGNOSIS — I2510 Atherosclerotic heart disease of native coronary artery without angina pectoris: Secondary | ICD-10-CM | POA: Insufficient documentation

## 2014-10-22 DIAGNOSIS — E669 Obesity, unspecified: Secondary | ICD-10-CM | POA: Insufficient documentation

## 2014-10-22 DIAGNOSIS — Z86010 Personal history of colonic polyps: Secondary | ICD-10-CM | POA: Insufficient documentation

## 2014-10-22 DIAGNOSIS — I4819 Other persistent atrial fibrillation: Secondary | ICD-10-CM

## 2014-10-22 DIAGNOSIS — F329 Major depressive disorder, single episode, unspecified: Secondary | ICD-10-CM | POA: Diagnosis not present

## 2014-10-22 DIAGNOSIS — E559 Vitamin D deficiency, unspecified: Secondary | ICD-10-CM | POA: Insufficient documentation

## 2014-10-22 DIAGNOSIS — I480 Paroxysmal atrial fibrillation: Secondary | ICD-10-CM | POA: Insufficient documentation

## 2014-10-22 LAB — CBC
HCT: 37 % (ref 36.0–46.0)
HEMOGLOBIN: 10.7 g/dL — AB (ref 12.0–15.0)
MCH: 22.6 pg — ABNORMAL LOW (ref 26.0–34.0)
MCHC: 28.9 g/dL — AB (ref 30.0–36.0)
MCV: 78.1 fL (ref 78.0–100.0)
Platelets: 281 10*3/uL (ref 150–400)
RBC: 4.74 MIL/uL (ref 3.87–5.11)
RDW: 19.1 % — AB (ref 11.5–15.5)
WBC: 9.5 10*3/uL (ref 4.0–10.5)

## 2014-10-22 LAB — BASIC METABOLIC PANEL
Anion gap: 13 (ref 5–15)
BUN: 14 mg/dL (ref 6–20)
CALCIUM: 9.9 mg/dL (ref 8.9–10.3)
CHLORIDE: 92 mmol/L — AB (ref 101–111)
CO2: 37 mmol/L — ABNORMAL HIGH (ref 22–32)
CREATININE: 1.37 mg/dL — AB (ref 0.44–1.00)
GFR calc non Af Amer: 38 mL/min — ABNORMAL LOW (ref >60)
GFR, EST AFRICAN AMERICAN: 43 mL/min — AB (ref >60)
Glucose, Bld: 113 mg/dL — ABNORMAL HIGH (ref 65–99)
Potassium: 3.9 mmol/L (ref 3.5–5.1)
SODIUM: 142 mmol/L (ref 135–145)

## 2014-10-22 MED ORDER — POTASSIUM CHLORIDE ER 20 MEQ PO TBCR: 20.0000 meq | EXTENDED_RELEASE_TABLET | Freq: Two times a day (BID) | ORAL | Status: DC
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
## 2014-10-22 NOTE — Patient Instructions (Signed)
Cardioversion scheduled for Thursday, September 29th  - Arrive at the [facility] and go to admitting at 1:00pm  -Do not eat or drink anything after midnight the night prior to your procedure.  - Take all your medication with a sip of water prior to arrival.  - You will not be able to drive home after your procedure.  Parking code for October 0900
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
## 2014-10-22 NOTE — ED Notes (Signed)
Pt arrived via EMS from home c/o hematuria.  Pt had cardiac ablation in August.  Pt has cardiac defibrillator.

## 2014-10-23 ENCOUNTER — Encounter (HOSPITAL_COMMUNITY): Payer: Self-pay | Admitting: Emergency Medicine

## 2014-10-23 ENCOUNTER — Other Ambulatory Visit (HOSPITAL_COMMUNITY): Payer: Self-pay | Admitting: *Deleted

## 2014-10-23 ENCOUNTER — Telehealth (HOSPITAL_COMMUNITY): Payer: Self-pay | Admitting: *Deleted

## 2014-10-23 ENCOUNTER — Emergency Department (HOSPITAL_COMMUNITY): Payer: Medicare HMO

## 2014-10-23 DIAGNOSIS — R319 Hematuria, unspecified: Secondary | ICD-10-CM | POA: Diagnosis not present

## 2014-10-23 LAB — CBC WITH DIFFERENTIAL/PLATELET
BASOS ABS: 0 10*3/uL (ref 0.0–0.1)
BASOS PCT: 0 %
EOS ABS: 0.1 10*3/uL (ref 0.0–0.7)
Eosinophils Relative: 1 %
HEMATOCRIT: 38.3 % (ref 36.0–46.0)
HEMOGLOBIN: 11 g/dL — AB (ref 12.0–15.0)
Lymphocytes Relative: 42 %
Lymphs Abs: 4.2 10*3/uL — ABNORMAL HIGH (ref 0.7–4.0)
MCH: 22.3 pg — ABNORMAL LOW (ref 26.0–34.0)
MCHC: 28.7 g/dL — AB (ref 30.0–36.0)
MCV: 77.5 fL — ABNORMAL LOW (ref 78.0–100.0)
MONOS PCT: 9 %
Monocytes Absolute: 0.9 10*3/uL (ref 0.1–1.0)
NEUTROS ABS: 4.7 10*3/uL (ref 1.7–7.7)
NEUTROS PCT: 48 %
Platelets: 291 10*3/uL (ref 150–400)
RBC: 4.94 MIL/uL (ref 3.87–5.11)
RDW: 19.1 % — ABNORMAL HIGH (ref 11.5–15.5)
WBC: 9.9 10*3/uL (ref 4.0–10.5)

## 2014-10-23 LAB — I-STAT CHEM 8, ED
BUN: 19 mg/dL (ref 6–20)
CHLORIDE: 91 mmol/L — AB (ref 101–111)
Calcium, Ion: 1.06 mmol/L — ABNORMAL LOW (ref 1.13–1.30)
Creatinine, Ser: 1.4 mg/dL — ABNORMAL HIGH (ref 0.44–1.00)
Glucose, Bld: 149 mg/dL — ABNORMAL HIGH (ref 65–99)
HEMATOCRIT: 41 % (ref 36.0–46.0)
Hemoglobin: 13.9 g/dL (ref 12.0–15.0)
POTASSIUM: 3.3 mmol/L — AB (ref 3.5–5.1)
SODIUM: 138 mmol/L (ref 135–145)
TCO2: 29 mmol/L (ref 0–100)

## 2014-10-23 LAB — URINE MICROSCOPIC-ADD ON

## 2014-10-23 LAB — URINALYSIS, ROUTINE W REFLEX MICROSCOPIC
GLUCOSE, UA: NEGATIVE mg/dL
KETONES UR: 15 mg/dL — AB
Nitrite: NEGATIVE
PROTEIN: 100 mg/dL — AB
Specific Gravity, Urine: 1.013 (ref 1.005–1.030)
UROBILINOGEN UA: 0.2 mg/dL (ref 0.0–1.0)
pH: 6.5 (ref 5.0–8.0)

## 2014-10-23 LAB — I-STAT TROPONIN, ED: Troponin i, poc: 0 ng/mL (ref 0.00–0.08)

## 2014-10-23 NOTE — ED Notes (Signed)
Patient transported to CT 

## 2014-10-23 NOTE — ED Notes (Signed)
Pt stable, ambulatory, states understanding of discharge instructions 

## 2014-10-23 NOTE — Discharge Instructions (Signed)
Hernia Repair Care After These instructions give you information on caring for yourself after your procedure. Your doctor may also give you more specific instructions. Call your doctor if you have any problems or questions after your procedure. HOME CARE   You may have changes in your poops (bowel movements).  You may have loose or watery poop (diarrhea).  You may be not able to poop.  Your bowels will slowly get back to normal.  Do not eat any food that makes you sick to your stomach (nauseous). Eat small meals 4 to 6 times a day instead of 3 large ones.  Do not drink pop. It will give you gas.  Do not drink alcohol.  Do not lift anything heavier than 10 pounds. This is about the weight of a gallon of milk.  Do not do anything that makes you very tired for at least 6 weeks.  Do not get your wound wet for 2 days.  You may take a sponge bath during this time.  After 2 days you may take a shower. Gently pat your surgical cut (incision) dry with a towel. Do not rub it.  For men: You may have been given an athletic supporter (scrotal support) before you left the hospital. It holds your scrotum and testicles closer to your body so there is no strain on your wound. Wear the supporter until your doctor tells you that you do not need it anymore. GET HELP RIGHT AWAY IF:  You have watery poop, or cannot poop for more than 3 days.  You feel sick to your stomach or throw up (vomit) more than 2 or 3 times.  You have temperature by mouth above 102 F (38.9 C).  You see redness or puffiness (swelling) around your wound.  You see yellowish white fluid (pus) coming from your wound.  You see a bulge or bump in your lower belly (abdomen) or near your groin.  You develop a rash, trouble breathing, or any other symptoms from medicines taken. MAKE SURE YOU:  Understand these instructions.  Will watch your condition.  Will get help right away if your are not doing well or get  worse. Document Released: 12/26/2007 Document Revised: 04/06/2011 Document Reviewed: 12/26/2007 [facility] Patient Information 2015 [place], [place]. This information is not intended to replace advice given to you by your health care provider. Make sure you discuss any questions you have with your health care provider.  Hematuria Hematuria is blood in your urine. It can be caused by a bladder infection, kidney infection, prostate infection, kidney stone, or cancer of your urinary tract. Infections can usually be treated with medicine, and a kidney stone usually will pass through your urine. If neither of these is the cause of your hematuria, further workup to find out the reason may be needed. It is very important that you tell your health care provider about any blood you see in your urine, even if the blood stops without treatment or happens without causing pain. Blood in your urine that happens and then stops and then happens again can be a symptom of a very serious condition. Also, pain is not a symptom in the initial stages of many urinary cancers. HOME CARE INSTRUCTIONS   Drink lots of fluid, 3-4 quarts a day. If you have been diagnosed with an infection, cranberry juice is especially recommended, in addition to large amounts of water.  Avoid caffeine, tea, and carbonated beverages because they tend to irritate the bladder.  Avoid alcohol because  it may irritate the prostate.  Take all medicines as directed by your health care provider.  If you were prescribed an antibiotic medicine, finish it all even if you start to feel better.  If you have been diagnosed with a kidney stone, follow your health care provider's instructions regarding straining your urine to catch the stone.  Empty your bladder often. Avoid holding urine for long periods of time.  After a bowel movement, women should cleanse front to back. Use each tissue only once.  Empty your bladder before and after sexual intercourse  if you are a female. SEEK MEDICAL CARE IF:  You develop back pain.  You have a fever.  You have a feeling of sickness in your stomach (nausea) or vomiting.  Your symptoms are not better in 3 days. Return sooner if you are getting worse. SEEK IMMEDIATE MEDICAL CARE IF:   You develop severe vomiting and are unable to keep the medicine down.  You develop severe back or abdominal pain despite taking your medicines.  You begin passing a large amount of blood or clots in your urine.  You feel extremely weak or faint, or you pass out. MAKE SURE YOU:   Understand these instructions.  Will watch your condition.  Will get help right away if you are not doing well or get worse. Document Released: 01/12/2005 Document Revised: 05/29/2013 Document Reviewed: 09/12/2012 [facility] Patient Information 2015 [place], [place]. This information is not intended to replace advice given to you by your health care provider. Make sure you discuss any questions you have with your health care provider.

## 2014-10-23 NOTE — Telephone Encounter (Addendum)
Patient daughter called in this morning to inform of having to go to ER last night due to blood in urine.  Testing came back negative for infection or kidney stones.  Was set up to see urology tomorrow afternoon at 2:30pm. DCCV that was set up for 9/29 was cancelled will re-assess need at later date. She reports pt blood pressure is 94/62 HR 88 no symptoms other than alitle dizziness with standing but no shortness of breath or chest pain. Dicussed instructions of when to return to ER if needed.  Daughter is inquiring whether she should go ahead and stop xarelto or not. Will forward to Dr. Bonita Quin nurse Claiborne Billings since patient with recent ablation. Please call her daughter Tomi Bamberger @ [phone].  Notified daughter that kelly will be in touch with her tomorrow morning once she speaks with Dr. Rayann Heman; daughter reports her urine has lightened up since this morning.   Talked with Dr. Rayann Heman -- he would prefer patient stay on xarelto unless urology strongly feels needs to be discontinued.

## 2014-10-24 ENCOUNTER — Encounter (HOSPITAL_COMMUNITY): Admission: RE | Payer: Self-pay | Source: Ambulatory Visit

## 2014-10-24 ENCOUNTER — Ambulatory Visit (HOSPITAL_COMMUNITY): Admission: RE | Admit: 2014-10-24 | Payer: Medicare HMO | Source: Ambulatory Visit | Admitting: Cardiovascular Disease

## 2014-10-24 LAB — URINE CULTURE: Culture: NO GROWTH

## 2014-10-24 SURGERY — CARDIOVERSION
Anesthesia: Monitor Anesthesia Care

## 2014-10-25 NOTE — Telephone Encounter (Signed)
Daughter called in this morning - seen urology yesterday and will have CT with contrast of right kidney today. Urologist told her to stay on xarelto. Urology notes to be sent to Dr. Rayann Heman for review per daughter.

## 2014-10-29 ENCOUNTER — Encounter: Payer: Self-pay | Admitting: Internal Medicine

## 2014-10-30 ENCOUNTER — Telehealth: Payer: Self-pay | Admitting: Internal Medicine

## 2014-10-30 NOTE — Telephone Encounter (Signed)
Pt has a CHADS score of 3.  No history of TIA or stroke.  Okay to hold Xarelto x 2 days prior to procedure.  Will send clearance to Dr. Lyndal Rainbow office.

## 2014-10-30 NOTE — Telephone Encounter (Signed)
New message       Request for surgical clearance:  What type of surgery is being performed? ureteroscopy 1. When is this surgery scheduled? After 11-11-14 it will be scheduled  Are there any medications that need to be held prior to surgery and how long? How long to hold xarelto 2. Name of physician performing surgery? Dr Junious Silk  3. What is your office phone and fax number? Fax [phone]

## 2014-10-31 ENCOUNTER — Other Ambulatory Visit: Payer: Self-pay | Admitting: Urology

## 2014-10-31 ENCOUNTER — Ambulatory Visit (HOSPITAL_COMMUNITY): Payer: Medicare HMO | Admitting: Nurse Practitioner

## 2014-11-01 ENCOUNTER — Other Ambulatory Visit: Payer: Self-pay | Admitting: Urology

## 2014-11-01 ENCOUNTER — Ambulatory Visit (HOSPITAL_COMMUNITY): Payer: Medicare HMO | Admitting: Nurse Practitioner

## 2014-11-07 ENCOUNTER — Telehealth: Payer: Self-pay | Admitting: Cardiology

## 2014-11-07 NOTE — Telephone Encounter (Signed)
Received records from [facility] for appointment on 12/31/14 with Dr [name].  Records given to [facility] (medical records) for Dr Doug Sou schedule on 12/31/14. lp

## 2014-11-08 ENCOUNTER — Ambulatory Visit (HOSPITAL_COMMUNITY): Payer: Medicare HMO | Admitting: Nurse Practitioner

## 2014-11-13 LAB — CUP PACEART REMOTE DEVICE CHECK: Date Time Interrogation Session: 20161018095108

## 2014-11-13 NOTE — Progress Notes (Signed)
Carelink summary report received. Battery status OK. Normal device function. No new symptom episodes, tachy episodes, brady, or pause episodes. 19.0% AF, 217 episodes + xarelto. Monthly summary reports and ROV w/ JA 12/12/14.

## 2014-11-14 ENCOUNTER — Ambulatory Visit (INDEPENDENT_AMBULATORY_CARE_PROVIDER_SITE_OTHER): Payer: Medicare HMO | Admitting: *Deleted

## 2014-11-14 DIAGNOSIS — I4819 Other persistent atrial fibrillation: Secondary | ICD-10-CM

## 2014-11-14 DIAGNOSIS — I481 Persistent atrial fibrillation: Secondary | ICD-10-CM

## 2014-11-14 LAB — CUP PACEART REMOTE DEVICE CHECK: MDC IDC SESS DTM: 20161108104232

## 2014-11-14 NOTE — Patient Instructions (Addendum)
[name]  11/14/2014   Your procedure is scheduled on: Friday 11/23/2014  Report to [facility]  Entrance take [place]  elevators to 3rd floor to  [place] at   0700  AM.  Call this number if you have problems the morning of surgery [phone]   Remember: ONLY 1 PERSON MAY GO WITH YOU TO SHORT STAY TO GET  READY MORNING OF [place].  Do not eat food or drink liquids :After Midnight.               TAKE 1/2 DOSE OF EVENING LANTUS THE NIGHT BEFORE SURGERY!   Take these medicines the morning of surgery with A SIP OF WATER: AMIODARONE (PACERONE), CLONAZEPAM, DILTIAZEM, METOPROLOL, SYNTHROID               DO NOT TAKE ANY DIABETIC MEDICATIONS DAY OF YOUR SURGERY! NO INSULIN OR MEDICATIONS BY MOUTH!                               You may not have any metal on your body including hair pins and              piercings  Do not wear jewelry, make-up, lotions, powders or perfumes, deodorant             Do not wear nail polish.  Do not shave  48 hours prior to surgery.              Men may shave face and neck.   Do not bring valuables to the hospital. [place].  Contacts, dentures or bridgework may not be worn into surgery.  Leave suitcase in the car. After surgery it may be brought to your room.     Patients discharged the day of surgery will not be allowed to drive home.  Name and phone number of your driver:  Special Instructions: N/A              Please read over the following fact sheets you were given: _____________________________________________________________________             [facility] - Preparing for Surgery Before surgery, you can play an important role.  Because skin is not sterile, your skin needs to be as free of germs as possible.  You can reduce the number of germs on your skin by washing with CHG (chlorahexidine gluconate) soap before surgery.  CHG is an antiseptic cleaner  which kills germs and bonds with the skin to continue killing germs even after washing. Please DO NOT use if you have an allergy to CHG or antibacterial soaps.  If your skin becomes reddened/irritated stop using the CHG and inform your nurse when you arrive at Short Stay. Do not shave (including legs and underarms) for at least 48 hours prior to the first CHG shower.  You may shave your face/neck. Please follow these instructions carefully:  1.  Shower with CHG Soap the night before surgery and the  morning of Surgery.  2.  If you choose to wash your hair, wash your hair first as usual with your  normal  shampoo.  3.  After you shampoo, rinse your hair and body thoroughly to remove the  shampoo.  4.  Use CHG as you would any other liquid soap.  You can apply chg directly  to the skin and wash                       Gently with a scrungie or clean washcloth.  5.  Apply the CHG Soap to your body ONLY FROM THE NECK DOWN.   Do not use on face/ open                           Wound or open sores. Avoid contact with eyes, ears mouth and genitals (private parts).                       Wash face,  Genitals (private parts) with your normal soap.             6.  Wash thoroughly, paying special attention to the area where your surgery  will be performed.  7.  Thoroughly rinse your body with warm water from the neck down.  8.  DO NOT shower/wash with your normal soap after using and rinsing off  the CHG Soap.                9.  Pat yourself dry with a clean towel.            10.  Wear clean pajamas.            11.  Place clean sheets on your bed the night of your first shower and do not  sleep with pets. Day of Surgery : Do not apply any lotions/deodorants the morning of surgery.  Please wear clean clothes to the hospital/surgery center.  FAILURE TO FOLLOW THESE INSTRUCTIONS MAY RESULT IN THE CANCELLATION OF YOUR SURGERY PATIENT SIGNATURE_________________________________  NURSE  SIGNATURE__________________________________  ________________________________________________________________________   [name]  An incentive spirometer is a tool that can help keep your lungs clear and active. This tool measures how well you are filling your lungs with each breath. Taking long deep breaths may help reverse or decrease the chance of developing breathing (pulmonary) problems (especially infection) following:  A long period of time when you are unable to move or be active. BEFORE THE PROCEDURE   If the spirometer includes an indicator to show your best effort, your nurse or respiratory therapist will set it to a desired goal.  If possible, sit up straight or lean slightly forward. Try not to slouch.  Hold the incentive spirometer in an upright position. INSTRUCTIONS FOR USE  1. Sit on the edge of your bed if possible, or sit up as far as you can in bed or on a chair. 2. Hold the incentive spirometer in an upright position. 3. Breathe out normally. 4. Place the mouthpiece in your mouth and seal your lips tightly around it. 5. Breathe in slowly and as deeply as possible, raising the piston or the ball toward the top of the column. 6. Hold your breath for 3-5 seconds or for as long as possible. Allow the piston or ball to fall to the bottom of the column. 7. Remove the mouthpiece from your mouth and breathe out normally. 8. Rest for a few seconds and repeat Steps 1 through 7 at least 10 times every 1-2 hours when you are awake. Take your time and take a few normal breaths between deep breaths. 9. The spirometer may include an indicator to show  your best effort. Use the indicator as a goal to work toward during each repetition. 10. After each set of 10 deep breaths, practice coughing to be sure your lungs are clear. If you have an incision (the cut made at the time of surgery), support your incision when coughing by placing a pillow or rolled up towels firmly  against it. Once you are able to get out of bed, walk around indoors and cough well. You may stop using the incentive spirometer when instructed by your caregiver.  RISKS AND COMPLICATIONS  Take your time so you do not get dizzy or light-headed.  If you are in pain, you may need to take or ask for pain medication before doing incentive spirometry. It is harder to take a deep breath if you are having pain. AFTER USE  Rest and breathe slowly and easily.  It can be helpful to keep track of a log of your progress. Your caregiver can provide you with a simple table to help with this. If you are using the spirometer at home, follow these instructions: [place] IF:   You are having difficultly using the spirometer.  You have trouble using the spirometer as often as instructed.  Your pain medication is not giving enough relief while using the spirometer.  You develop fever of 100.5 F (38.1 C) or higher. SEEK IMMEDIATE MEDICAL CARE IF:   You cough up bloody sputum that had not been present before.  You develop fever of 102 F (38.9 C) or greater.  You develop worsening pain at or near the incision site. MAKE SURE YOU:   Understand these instructions.  Will watch your condition.  Will get help right away if you are not doing well or get worse. Document Released: 05/25/2006 Document Revised: 04/06/2011 Document Reviewed: 07/26/2006 [facility] Patient Information 2014 [place], [place].   ________________________________________________________________________

## 2014-11-14 NOTE — Progress Notes (Addendum)
10/22/2014-NOTED IN EPIC-EKG. 10/23/2014-NOTED IN EPIC-2 VIEW CXR 09/11/2014-NOTED IN EPIC-EP STUDY BY DR. Rayann Heman 10/16/2014-NOTED IN EPIC- DEVICE CHECK NOTE-[employer]TR[employer] DEVICE 10/30/2014-noted in epic a note from pharmacy about stopping Xarelto 2 days prior to surgery -Talked to [place] at [facility] and states this was the clearance from Dr. Rayann Heman.

## 2014-11-15 ENCOUNTER — Encounter (HOSPITAL_COMMUNITY)
Admission: RE | Admit: 2014-11-15 | Discharge: 2014-11-15 | Disposition: A | Discharge status: Home / Self Care | Payer: Medicare HMO | Source: Ambulatory Visit | Attending: Urology | Admitting: Urology

## 2014-11-15 ENCOUNTER — Encounter (HOSPITAL_COMMUNITY): Payer: Self-pay

## 2014-11-15 DIAGNOSIS — D4959 Neoplasm of unspecified behavior of other genitourinary organ: Secondary | ICD-10-CM | POA: Insufficient documentation

## 2014-11-15 DIAGNOSIS — R310 Gross hematuria: Secondary | ICD-10-CM | POA: Insufficient documentation

## 2014-11-15 DIAGNOSIS — Z01818 Encounter for other preprocedural examination: Secondary | ICD-10-CM | POA: Insufficient documentation

## 2014-11-15 HISTORY — DX: Gout, unspecified: M10.9

## 2014-11-15 HISTORY — DX: Cardiac arrhythmia, unspecified: I49.9

## 2014-11-15 HISTORY — DX: Unspecified mononeuropathy of bilateral lower limbs: G57.93

## 2014-11-15 LAB — BASIC METABOLIC PANEL
ANION GAP: 13 (ref 5–15)
BUN: 16 mg/dL (ref 6–20)
CALCIUM: 9.7 mg/dL (ref 8.9–10.3)
CO2: 33 mmol/L — AB (ref 22–32)
Chloride: 94 mmol/L — ABNORMAL LOW (ref 101–111)
Creatinine, Ser: 1.88 mg/dL — ABNORMAL HIGH (ref 0.44–1.00)
GFR calc Af Amer: 30 mL/min — ABNORMAL LOW (ref >60)
GFR calc non Af Amer: 26 mL/min — ABNORMAL LOW (ref >60)
GLUCOSE: 107 mg/dL — AB (ref 65–99)
POTASSIUM: 3.6 mmol/L (ref 3.5–5.1)
Sodium: 140 mmol/L (ref 135–145)

## 2014-11-15 LAB — CBC
HEMATOCRIT: 35.7 % — AB (ref 36.0–46.0)
Hemoglobin: 10 g/dL — ABNORMAL LOW (ref 12.0–15.0)
MCH: 21.6 pg — AB (ref 26.0–34.0)
MCHC: 28 g/dL — ABNORMAL LOW (ref 30.0–36.0)
MCV: 76.9 fL — AB (ref 78.0–100.0)
Platelets: 303 10*3/uL (ref 150–400)
RBC: 4.64 MIL/uL (ref 3.87–5.11)
RDW: 18.6 % — AB (ref 11.5–15.5)
WBC: 10.9 10*3/uL — AB (ref 4.0–10.5)

## 2014-11-15 NOTE — Progress Notes (Signed)
   11/15/14 1432  OBSTRUCTIVE SLEEP APNEA  Have you ever been diagnosed with sleep apnea through a sleep study? No  Do you snore loudly (loud enough to be heard through closed doors)?  0  Do you often feel tired, fatigued, or sleepy during the daytime (such as falling asleep during driving or talking to someone)? 1  Has anyone observed you stop breathing during your sleep? 0  Do you have, or are you being treated for high blood pressure? 1  BMI more than 35 kg/m2? 1  Age > 50 (1-yes) 1  Neck circumference greater than:Female 16 inches or larger, Female 17inches or larger? 1  Female Gender (Yes=1) 0  Obstructive Sleep Apnea Score 5  Score 5 or greater  Results sent to PCP

## 2014-11-15 NOTE — Progress Notes (Signed)
LOOP RECORDER  

## 2014-11-22 NOTE — H&P (Signed)
History of Present Illness F/u - Cards Dr. Rayann Heman, PCP Dr Melina Copa     1 - gross hematuria - about two days ago pt voided and noted red or cranberry urine. No clots. Her hgb 11, hct 38 which was higher than last month when hgb was 10.5, hct 34. UA tntc rbc's, rare bac. Bun 14, cr 1.37 (gfr 38) - Sept 26, 2016. Non-con CT - benign, renal atrophy. Obese. I reviewed images. Fullness RLP collecting system. Non-smoker. No exposure risk although she did work in [employer], [employer].   -Sep 2016 - cystoscopy normal - bloody efflux noted from right ureteral orifice    2- LUTS - No dysuria. Usually voids with crede, bending over. Has hesitancy. NG risk includes DM and PN.  -Sep 2016 PVR 100 ml (cathed)    PMH -- H/o afib on Xarelto. Recent ablation. Dr. Rayann Heman recommended staying on Xarelto at least for 8 weeks -- Oct 16.        Oct 2016 int hx  Returns in continued management of gross hematuria. She underwent a CT scan of the abdomen and pelvis and as I suspected there was potentially an enhancing lesion in the right lower pole collecting system. Her vitals are stable but she's felt tired and weak and they would like to check a hemoglobin and hematocrit. She had labs drawn earlier but for some reason his CBC wasn't done. Fortunately, the gross hematuria has cleared. UA today shows microscopic hematuria.   Past Medical History Problems  1. History of Anxiety (F41.9) 2. History of CAD (coronary artery disease) (I25.10) 3. History of Diverticulosis (K57.90) 4. History of Gastroparesis (K31.84) 5. History of adenomatous polyp of colon (Z86.010) 6. History of asthma (Z87.09) 7. History of atrial fibrillation (Z86.79) 8. History of atrial flutter (Z86.79) 9. History of Bell's palsy (Z86.69) 10. History of degenerative disc disease (Z87.39) 11. History of depression (Z86.59) 12. History of diabetes mellitus (Z86.39) 13. History of esophageal reflux (Z87.19) 14. History of hemorrhoids  (Z87.19) 15. History of hiatal hernia (Z87.19) 16. History of hyperlipidemia (Z86.39) 17. History of hypothyroidism (Z86.39) 18. History of irritable bowel syndrome (Z87.19) 19. History of neuropathy (Z86.69) 20. History of obesity (Z86.39) 21. History of osteoarthritis (Z87.39) 22. History of renal calculi (Z87.442) 23. History of syncope (Z87.898) 24. History of Nonischemic cardiomyopathy (I42.9) 25. History of Vitamin D deficiency (E55.9)  Surgical History Problems  1. History of Addit Cerebral Art Catheteriz - Aortic Appr Init Third Ord 2. History of Additional Abdominal Artery Catheterization - Via Aorta 3. History of Cesarean Section 4. History of Cholecystectomy 5. History of Elective Cardioversion 6. History of Esophageal Dilation 7. History of Facial Surgery 8. History of Hand Surgery 9. History of Neuroplasty Decompression Median Nerve At Carpal Tunnel 10. History of Nose Surgery 11. History of Total Abdominal Hysterectomy 12. History of Tubal Ligation  Current Meds 1. Amiodarone HCl - 200 MG Oral Tablet;  Therapy: (Recorded:27Sep2016) to Recorded 2. Benadryl TABS;  Therapy: (Recorded:27Sep2016) to Recorded 3. ClonazePAM 0.5 MG Oral Tablet;  Therapy: (Recorded:27Sep2016) to Recorded 4. Cyanocobalamin TABS;  Therapy: (Recorded:27Sep2016) to Recorded 5. Diltiazem HCl ER 240 MG Oral Capsule Extended Release 24 Hour;  Therapy: (Recorded:27Sep2016) to Recorded 6. Estradiol 1 MG Oral Tablet;  Therapy: (Recorded:27Sep2016) to Recorded 7. GlipiZIDE 10 MG Oral Tablet;  Therapy: (Recorded:27Sep2016) to Recorded 8. Lantus 100 UNIT/ML Subcutaneous Solution;  Therapy: (Recorded:27Sep2016) to Recorded 9. Levothyroxine Sodium 137 MCG Oral Tablet;  Therapy: (Recorded:27Sep2016) to Recorded 10. Magnesium Oxide 400 MG Oral Tablet;  Therapy: (Recorded:27Sep2016) to Recorded 11. MetFORMIN HCl - 500 MG Oral Tablet;   Therapy: (Recorded:27Sep2016) to Recorded 12. Metoprolol  Tartrate 50 MG Oral Tablet;   Therapy: (Recorded:27Sep2016) to Recorded 13. Potassium Chloride Crys ER 20 MEQ Oral Tablet Extended Release;   Therapy: (Recorded:27Sep2016) to Recorded 14. Prevacid 30 MG Oral Capsule Delayed Release;   Therapy: (Recorded:27Sep2016) to Recorded 15. Simvastatin 20 MG Oral Tablet;   Therapy: (Recorded:27Sep2016) to Recorded 16. Torsemide 20 MG Oral Tablet;   Therapy: (Recorded:27Sep2016) to Recorded 17. Tylenol CAPS;   Therapy: (Recorded:27Sep2016) to Recorded 18. Ultram 50 MG Oral Tablet;   Therapy: (Recorded:27Sep2016) to Recorded 19. Xarelto 20 MG Oral Tablet;   Therapy: (Recorded:27Sep2016) to Recorded 20. Zyrtec 10 MG TABS;   Therapy: (Recorded:27Sep2016) to Recorded  Allergies Medication  1. Adhesive Tape 1/2"x10yd TAPE 2. Avelox TABS 3. Blueberry Flavor LIQD 4. cefprozil 5. Cetacaine 6. dicyclomine 7. diltiazem 8. Hydrocodone-Acetaminophen CAPS 9. Imdur 10. Januvia TABS 11. Lasix TABS 12. Lipitor TABS 13. Losartan Potassium TABS 14. nitroglycerin 15. oxycodone 16. Penicillins 17. PredniSONE TABS 18. Tamiflu 19. vancomycin  Family History Problems  1. Family history of Colon cancer : Father, Son 2. Family history of diabetes mellitus (Z83.3) : Mother, Father, Sister 3. Family history of irritable bowel syndrome (Z83.79) : Sister 4. Family history of kidney stones (Z84.1) : Father 5. Family history of liver cancer (Z80.0) : Sister 36. Family history of malignant neoplasm of esophagus (Z80.0) : Father 7. Family history of malignant neoplasm of kidney (Z80.51) : Father 8. Family history of myocardial infarction (Z82.49) : Mother 25. Family history of myocarditis (Z82.49) : Brother 10. Family history of renal failure (Z84.1) : Father 39. Family history of Ovarian cancer : Sister  Social History Problems  1. Denied: History of Alcohol use 2. Daily caffeine consumption, 2-3 servings a day 3. Disabled 4. Divorced 5. Father  deceased 38. Mother deceased 38. Never a smoker 8. No alcohol use 9. Non-smoker (Z78.9) 10. Three children  Vitals Vital Signs [Data Includes: Last 1 Day]  Recorded: 04Oct2016 02:46PM  Blood Pressure: 119 / 75 Temperature: 97.8 F Heart Rate: 76  Physical Exam Constitutional: Well nourished and well developed . No acute distress.  Pulmonary: No respiratory distress and normal respiratory rhythm and effort.  Cardiovascular: Heart rate and rhythm are normal . No peripheral edema.  Neuro/Psych:. Mood and affect are appropriate.    Results/Data Urine [Data Includes: Last 1 Day]   04Oct2016  COLOR YELLOW   APPEARANCE CLEAR   SPECIFIC GRAVITY 1.015   pH 8.0   GLUCOSE NEGATIVE   BILIRUBIN NEGATIVE   KETONE NEGATIVE   BLOOD 3+   PROTEIN NEGATIVE   NITRITE NEGATIVE   LEUKOCYTE ESTERASE NEGATIVE   SQUAMOUS EPITHELIAL/HPF 0-5 HPF  WBC 0-5 WBC/HPF  RBC 3-10 RBC/HPF  BACTERIA FEW HPF  CRYSTALS NONE SEEN HPF  CASTS NONE SEEN LPF  Yeast NONE SEEN HPF   The following images/tracing/specimen were independently visualized:  CT.    Assessment Assessed  1. Gross hematuria (R31.0)  Plan Gross hematuria  1. HEMOGLOBIN & HEMATOCRIT; Status:Hold For - Specimen/Data Collection,Appointment;  Requested for:04Oct2016;  2. URINE CYTOLOGY; Status:Hold For - Specimen/Data Collection,Appointment;  Requested for:04Oct2016;  3. Follow-up Schedule Surgery Office  Follow-up  Status: Hold For - Appointment   Requested for: 04Oct2016 Health Maintenance  4. UA With REFLEX; [Do Not Release]; Status:Complete;   DoneKD:[id number] 02:39PM  Discussion/Summary    Gross hematuria-I discussed the patient with Dr. Rayann Heman last. We need to let  her cardiac nerves heal after the ablation and this will require continued Xarelto. Fortunately, the bleeding has subsided. I did send urine for cytology and discuss with the patient and her daughter the nature risks benefits and alternatives to ureteroscopy with stent  placement, biopsy. We discussed she may need a staged procedure as well as risks of bleeding, infection and cardiovascular risks among others.     Signatures Electronically signed by : Festus Aloe, M.D.; Oct 30 2014  3:16PM EST  Add: cytology was negative.

## 2014-11-23 ENCOUNTER — Encounter (HOSPITAL_COMMUNITY)
Admission: RE | Disposition: A | Discharge status: Home / Self Care | Payer: Self-pay | Source: Ambulatory Visit | Attending: Urology

## 2014-11-23 ENCOUNTER — Ambulatory Visit (HOSPITAL_COMMUNITY): Payer: Medicare HMO | Admitting: Anesthesiology

## 2014-11-23 ENCOUNTER — Encounter (HOSPITAL_COMMUNITY): Payer: Self-pay | Admitting: *Deleted

## 2014-11-23 ENCOUNTER — Ambulatory Visit (HOSPITAL_COMMUNITY)
Admission: RE | Admit: 2014-11-23 | Discharge: 2014-11-23 | Disposition: A | Discharge status: Home / Self Care | Payer: Medicare HMO | Source: Ambulatory Visit | Attending: Urology | Admitting: Urology

## 2014-11-23 DIAGNOSIS — R310 Gross hematuria: Secondary | ICD-10-CM | POA: Diagnosis present

## 2014-11-23 DIAGNOSIS — I2510 Atherosclerotic heart disease of native coronary artery without angina pectoris: Secondary | ICD-10-CM | POA: Diagnosis not present

## 2014-11-23 DIAGNOSIS — I509 Heart failure, unspecified: Secondary | ICD-10-CM | POA: Insufficient documentation

## 2014-11-23 DIAGNOSIS — F419 Anxiety disorder, unspecified: Secondary | ICD-10-CM | POA: Insufficient documentation

## 2014-11-23 DIAGNOSIS — Z87442 Personal history of urinary calculi: Secondary | ICD-10-CM | POA: Diagnosis not present

## 2014-11-23 DIAGNOSIS — I4891 Unspecified atrial fibrillation: Secondary | ICD-10-CM | POA: Diagnosis not present

## 2014-11-23 DIAGNOSIS — E039 Hypothyroidism, unspecified: Secondary | ICD-10-CM | POA: Insufficient documentation

## 2014-11-23 DIAGNOSIS — E119 Type 2 diabetes mellitus without complications: Secondary | ICD-10-CM | POA: Diagnosis not present

## 2014-11-23 DIAGNOSIS — E785 Hyperlipidemia, unspecified: Secondary | ICD-10-CM | POA: Diagnosis not present

## 2014-11-23 DIAGNOSIS — F329 Major depressive disorder, single episode, unspecified: Secondary | ICD-10-CM | POA: Insufficient documentation

## 2014-11-23 DIAGNOSIS — Z6839 Body mass index (BMI) 39.0-39.9, adult: Secondary | ICD-10-CM | POA: Diagnosis not present

## 2014-11-23 DIAGNOSIS — I10 Essential (primary) hypertension: Secondary | ICD-10-CM | POA: Insufficient documentation

## 2014-11-23 DIAGNOSIS — K219 Gastro-esophageal reflux disease without esophagitis: Secondary | ICD-10-CM | POA: Insufficient documentation

## 2014-11-23 DIAGNOSIS — J45909 Unspecified asthma, uncomplicated: Secondary | ICD-10-CM | POA: Insufficient documentation

## 2014-11-23 DIAGNOSIS — Z79899 Other long term (current) drug therapy: Secondary | ICD-10-CM | POA: Diagnosis not present

## 2014-11-23 DIAGNOSIS — Z794 Long term (current) use of insulin: Secondary | ICD-10-CM | POA: Insufficient documentation

## 2014-11-23 DIAGNOSIS — Z7984 Long term (current) use of oral hypoglycemic drugs: Secondary | ICD-10-CM | POA: Diagnosis not present

## 2014-11-23 DIAGNOSIS — N3289 Other specified disorders of bladder: Secondary | ICD-10-CM | POA: Diagnosis not present

## 2014-11-23 HISTORY — PX: CYSTOSCOPY WITH URETEROSCOPY AND STENT PLACEMENT: SHX6377

## 2014-11-23 LAB — GLUCOSE, CAPILLARY
GLUCOSE-CAPILLARY: 106 mg/dL — AB (ref 65–99)
Glucose-Capillary: 101 mg/dL — ABNORMAL HIGH (ref 65–99)

## 2014-11-23 SURGERY — CYSTOURETEROSCOPY, WITH STENT INSERTION
Anesthesia: General | Laterality: Right

## 2014-11-23 MED ORDER — FENTANYL CITRATE (PF) 100 MCG/2ML IJ SOLN
INTRAMUSCULAR | Status: AC
  Filled 2014-11-23: qty 4

## 2014-11-23 MED ORDER — PHENYLEPHRINE HCL 10 MG/ML IJ SOLN
INTRAMUSCULAR | Status: DC | PRN
  Administered 2014-11-23: 120 ug via INTRAVENOUS
  Administered 2014-11-23: 80 ug via INTRAVENOUS

## 2014-11-23 MED ORDER — MEPERIDINE HCL 50 MG/ML IJ SOLN: 6.2500 mg | INTRAMUSCULAR | Status: DC | PRN

## 2014-11-23 MED ORDER — LIDOCAINE HCL (CARDIAC) 20 MG/ML IV SOLN
INTRAVENOUS | Status: AC
  Filled 2014-11-23: qty 5

## 2014-11-23 MED ORDER — SODIUM CHLORIDE 0.9 % IR SOLN
Status: DC | PRN
  Administered 2014-11-23: 1000 mL

## 2014-11-23 MED ORDER — PROPOFOL 10 MG/ML IV BOLUS
INTRAVENOUS | Status: DC | PRN
  Administered 2014-11-23: 150 mg via INTRAVENOUS

## 2014-11-23 MED ORDER — EPHEDRINE SULFATE 50 MG/ML IJ SOLN
INTRAMUSCULAR | Status: DC | PRN
  Administered 2014-11-23: 10 mg via INTRAVENOUS
  Administered 2014-11-23: 15 mg via INTRAVENOUS

## 2014-11-23 MED ORDER — SUCCINYLCHOLINE CHLORIDE 20 MG/ML IJ SOLN
INTRAMUSCULAR | Status: DC | PRN
  Administered 2014-11-23: 100 mg via INTRAVENOUS

## 2014-11-23 MED ORDER — CLINDAMYCIN PHOSPHATE 900 MG/50ML IV SOLN
900.0000 mg | INTRAVENOUS | Status: AC
  Administered 2014-11-23: 900 mg via INTRAVENOUS

## 2014-11-23 MED ORDER — PROMETHAZINE HCL 25 MG/ML IJ SOLN: 6.2500 mg | INTRAMUSCULAR | Status: DC | PRN

## 2014-11-23 MED ORDER — CLINDAMYCIN PHOSPHATE 900 MG/50ML IV SOLN
INTRAVENOUS | Status: AC
  Filled 2014-11-23: qty 50

## 2014-11-23 MED ORDER — LIDOCAINE HCL (CARDIAC) 20 MG/ML IV SOLN
INTRAVENOUS | Status: DC | PRN
  Administered 2014-11-23: 100 mg via INTRAVENOUS

## 2014-11-23 MED ORDER — PROPOFOL 10 MG/ML IV BOLUS
INTRAVENOUS | Status: AC
  Filled 2014-11-23: qty 20

## 2014-11-23 MED ORDER — ONDANSETRON HCL 4 MG/2ML IJ SOLN
INTRAMUSCULAR | Status: DC | PRN
  Administered 2014-11-23: 4 mg via INTRAVENOUS

## 2014-11-23 MED ORDER — ONDANSETRON HCL 4 MG/2ML IJ SOLN
INTRAMUSCULAR | Status: AC
  Filled 2014-11-23: qty 2

## 2014-11-23 MED ORDER — STERILE WATER FOR IRRIGATION IR SOLN
Status: DC | PRN
  Administered 2014-11-23: 500 mL

## 2014-11-23 MED ORDER — FENTANYL CITRATE (PF) 100 MCG/2ML IJ SOLN
INTRAMUSCULAR | Status: DC | PRN
  Administered 2014-11-23: 50 ug via INTRAVENOUS

## 2014-11-23 MED ORDER — PHENYLEPHRINE 40 MCG/ML (10ML) SYRINGE FOR IV PUSH (FOR BLOOD PRESSURE SUPPORT)
PREFILLED_SYRINGE | INTRAVENOUS | Status: AC
  Filled 2014-11-23: qty 10

## 2014-11-23 MED ORDER — LACTATED RINGERS IV SOLN
INTRAVENOUS | Status: DC
  Administered 2014-11-23: 1000 mL via INTRAVENOUS
  Administered 2014-11-23: 11:00:00 via INTRAVENOUS

## 2014-11-23 MED ORDER — FENTANYL CITRATE (PF) 100 MCG/2ML IJ SOLN: 25.0000 ug | INTRAMUSCULAR | Status: DC | PRN

## 2014-11-23 MED ORDER — GENTAMICIN SULFATE 40 MG/ML IJ SOLN
100.0000 mg | INTRAVENOUS | Status: AC
  Administered 2014-11-23: 100 mg via INTRAVENOUS
  Filled 2014-11-23: qty 2.5

## 2014-11-23 MED ORDER — TRAMADOL HCL 50 MG PO TABS
50.0000 mg | ORAL_TABLET | Freq: Once | ORAL | Status: AC
  Administered 2014-11-23: 50 mg via ORAL
  Filled 2014-11-23: qty 1

## 2014-11-23 SURGICAL SUPPLY — 26 items
BAG URO CATCHER STRL LF (DRAPE) ×3 IMPLANT
BASKET STNLS GEMINI 4WIRE 3FR (BASKET) IMPLANT
BASKET ZERO TIP NITINOL 2.4FR (BASKET) IMPLANT
BSKT STON RTRVL GEM 120X11 3FR (BASKET)
BSKT STON RTRVL ZERO TP 2.4FR (BASKET)
CATH INTERMIT  6FR 70CM (CATHETERS) ×3 IMPLANT
CATH URET 5FR 28IN CONE TIP (BALLOONS)
CATH URET 5FR 70CM CONE TIP (BALLOONS) ×1 IMPLANT
CATH URET DUAL LUMEN 6-10FR 50 (CATHETERS) IMPLANT
CLOTH BEACON ORANGE TIMEOUT ST (SAFETY) ×3 IMPLANT
EXTRACTOR STONE NITINOL NGAGE (UROLOGICAL SUPPLIES) IMPLANT
GLOVE BIO SURGEON STRL SZ7.5 (GLOVE) ×1 IMPLANT
GLOVE BIOGEL PI IND STRL 7.5 (GLOVE) IMPLANT
GLOVE BIOGEL PI INDICATOR 7.5 (GLOVE) ×2
GOWN STRL REUS W/TWL XL LVL3 (GOWN DISPOSABLE) ×3 IMPLANT
GUIDEWIRE ANG ZIPWIRE 038X150 (WIRE) IMPLANT
GUIDEWIRE STR DUAL SENSOR (WIRE) ×3 IMPLANT
MANIFOLD NEPTUNE II (INSTRUMENTS) ×3 IMPLANT
MARKER SKIN DUAL TIP RULER LAB (MISCELLANEOUS) ×2 IMPLANT
PACK CYSTO (CUSTOM PROCEDURE TRAY) ×3 IMPLANT
SHEATH ACCESS URETERAL 38CM (SHEATH) ×2 IMPLANT
STENT URET 6FRX24 CONTOUR (STENTS) ×2 IMPLANT
TUBING CONNECTING 10 (TUBING) ×2 IMPLANT
TUBING CONNECTING 10' (TUBING) ×1
WATER STERILE IRR 1000ML UROMA (IV SOLUTION) ×2 IMPLANT
WIRE COONS/BENSON .038X145CM (WIRE) ×3 IMPLANT

## 2014-11-23 NOTE — Transfer of Care (Signed)
Immediate Anesthesia Transfer of Care Note  Patient: [name]  Procedure(s) Performed: Procedure(s): CYSTOSCOPY RIGHT URETEROSCOPY , RETROGRADE AND STENT PLACEMENT, BLADDER BIOPSY AND FULGURATION (Right)  Patient Location: PACU  Anesthesia Type:General  Level of Consciousness: sedated  Airway & Oxygen Therapy: Patient Spontanous Breathing and Patient connected to face mask oxygen  Post-op Assessment: Report given to RN and Post -op Vital signs reviewed and stable  Post vital signs: Reviewed and stable  Last Vitals:  Filed Vitals:   11/23/14 0723  BP:   Pulse: 48  Temp:   Resp:     Complications: No apparent anesthesia complications

## 2014-11-23 NOTE — Discharge Instructions (Signed)
Ureteral Stent Implantation, Care After Refer to this sheet in the next few weeks. These instructions provide you with information on caring for yourself after your procedure. Your health care provider may also give you more specific instructions. Your treatment has been planned according to current medical practices, but problems sometimes occur. Call your health care provider if you have any problems or questions after your procedure. WHAT TO EXPECT AFTER THE PROCEDURE You should be back to normal activity within 48 hours after the procedure. Nausea and vomiting may occur and are commonly the result of anesthesia. It is common to experience sharp pain in the back or lower abdomen and penis with voiding. This is caused by movement of the ends of the stent with the act of urinating.It usually goes away within minutes after you have stopped urinating. HOME CARE INSTRUCTIONS Make sure to drink plenty of fluids. You may have small amounts of bleeding, causing your urine to be red. This is normal. Certain movements may trigger pain or a feeling that you need to urinate. You may be given medicines to prevent infection or bladder spasms. Be sure to take all medicines as directed. Only take over-the-counter or prescription medicines for pain, discomfort, or fever as directed by your health care provider. Do not take aspirin, as this can make bleeding worse.  Your stent will be left in until your doctor looks inside the kidney again in a few weeks.   Be sure to keep all follow-up appointments so your health care provider can check that you are healing properly.  SEEK MEDICAL CARE IF:  You experience increasing pain.  Your pain medicine is not working. SEEK IMMEDIATE MEDICAL CARE IF:  Your urine is dark red or has blood clots.  You are leaking urine (incontinent).  You have a fever, chills, feeling sick to your stomach (nausea), or vomiting.  Your pain is not relieved by pain medicine.  The end of  the stent comes out of the urethra.  You are unable to urinate.   This information is not intended to replace advice given to you by your health care provider. Make sure you discuss any questions you have with your health care provider.   Document Released: 09/14/2012 Document Revised: 01/17/2013 Document Reviewed: 07/27/2014 Elsevier Interactive Patient Education [employer].

## 2014-11-23 NOTE — Anesthesia Postprocedure Evaluation (Signed)
  Anesthesia Post-op Note  Patient: [name]  Procedure(s) Performed: Procedure(s) (LRB): CYSTOSCOPY RIGHT URETEROSCOPY , RETROGRADE AND STENT PLACEMENT, BLADDER BIOPSY AND FULGURATION (Right)  Patient Location: PACU  Anesthesia Type: General  Level of Consciousness: awake and alert   Airway and Oxygen Therapy: Patient Spontanous Breathing  Post-op Pain: mild  Post-op Assessment: Post-op Vital signs reviewed, Patient's Cardiovascular Status Stable, Respiratory Function Stable, Patent Airway and No signs of Nausea or vomiting  Last Vitals:  Filed Vitals:   11/23/14 1352  BP: 112/85  Pulse: 65  Temp: 36.6 C  Resp: 18    Post-op Vital Signs: stable   Complications: No apparent anesthesia complications

## 2014-11-23 NOTE — Anesthesia Preprocedure Evaluation (Addendum)
Anesthesia Evaluation  Patient identified by MRN, date of birth, ID band Patient awake    Reviewed: Allergy & Precautions, NPO status , Patient's Chart, lab work & pertinent test results  Airway Mallampati: II  TM Distance: >3 FB Neck ROM: Full    Dental no notable dental hx. (+) Missing   Pulmonary neg pulmonary ROS, asthma ,    Pulmonary exam normal breath sounds clear to auscultation       Cardiovascular hypertension, Pt. on medications + angina + CAD and +CHF  Normal cardiovascular exam+ dysrhythmias Atrial Fibrillation  Rhythm:Regular Rate:Normal     Neuro/Psych Anxiety Depression negative neurological ROS  negative psych ROS   GI/Hepatic Neg liver ROS, GERD  Medicated and Controlled,  Endo/Other  diabetesMorbid obesity  Renal/GU negative Renal ROS  negative genitourinary   Musculoskeletal negative musculoskeletal ROS (+)   Abdominal   Peds negative pediatric ROS (+)  Hematology negative hematology ROS (+)   Anesthesia Other Findings   Reproductive/Obstetrics negative OB ROS                            Anesthesia Physical Anesthesia Plan  ASA: III  Anesthesia Plan: General   Post-op Pain Management:    Induction: Intravenous  Airway Management Planned: Oral ETT  Additional Equipment:   Intra-op Plan:   Post-operative Plan: Extubation in OR  Informed Consent: I have reviewed the patients History and Physical, chart, labs and discussed the procedure including the risks, benefits and alternatives for the proposed anesthesia with the patient or authorized representative who has indicated his/her understanding and acceptance.   Dental advisory given  Plan Discussed with: CRNA  Anesthesia Plan Comments:         Anesthesia Quick Evaluation

## 2014-11-23 NOTE — Progress Notes (Signed)
Report given to Darlene in holding area.

## 2014-11-23 NOTE — Op Note (Signed)
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  Indication for procedure:  74 year old with gross hematuria and filling defect in right upper pole collecting system. She was brought today for retrograde and ureteroscopy and we discussed preoperatively again she may need pre-stent, staged procedure.   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  Clear efflux from both ureteral orifices, trigone normal. There were no stones or foreign bodies in the bladder no papillary tumors.  Right retrograde pyelogram-this outlined a single ureter single collecting system unit. There was some typical narrowing as the ureter curved over the iliacs and in the ureter narrowed again in the proximal ureter and smoothly dilated out to a normal-appearing renal pelvis and a lower infundibulum and went to the middle pole and lower pole calyces which all appeared normal. The upper pole infundibulum had a large filling defect that initially prevented proximal contrast migration and then as contrast went around formed a filling defect.  On ureteroscopy proximal ureter narrowed just enough to allow wire passage I could not negotiate the scope through this area. Therefore a stent was placed and she will be brought back for ureteroscopy in a few weeks.  Description of procedure: After consent was obtained patient brought to the operating room. After adequate anesthesia she is placed in lithotomy position and prepped and draped in the usual sterile fashion.  A cystoscope was passed per urethra and the bladder carefully inspected with a 30 and 70 lens. A 6 French open-ended catheter was cannulated in the right ureteral orifice and retrograde injection of contrast was performed.    the bladder was drained and the scope removed. A semirigid ureteroscope, 4.5 [place], was advanced using a no touch technique but the ureter took quite a turn and narrowed over the iliacs and I could not negotiate the scope past the mid mid ureter. Therefore under direct vision a sensor wire was advanced into the renal pelvis.  This semirigid ureteroscope was backed out. I passed an access sheath to get two wires in place. The access sheath was then placed over one of the wires leaving the Glidewire as the safety and I pushed the access sheath this far as I could proximally. There was some resistance through the mid ureter. The digital ureteroscope was then advanced into the proximal ureter and the ureter narrowed and was just large enough to allow the wire to pass. I passed a second wire tried the guide the ureteroscope over the second wire but could not. I thought it best to leave a stent. The access sheath and the ureteroscope were backed out together and the visualized portions of the proximal mid and distal ureter all appeared normal without injury.  The cystoscope was replaced and the cold cup biopsy forceps were advanced and a biopsy taken to the posterior bladder wall. The biopsy site was lightly fulgurated providing excellent hemostasis.  The wire was backloaded on the cystoscope and a 6 x 24 cm stent was advanced. The wire was removed with a good coil seen in the collecting system and a good coil in the bladder. The bladder was drained and the scope removed she was  awakened and taken to recovery room in stable condition.  Complications: None Blood loss: Minimal Specimens: Bladder biopsy posterior to pathology  Drains: 6 x 24 cm right ureteral stent  Will plan to set  patient up for a staged right ureteroscopy in a few weeks.

## 2014-11-23 NOTE — Anesthesia Procedure Notes (Signed)
Procedure Name: Intubation Date/Time: 11/23/2014 9:18 AM Performed by: Lind Covert Pre-anesthesia Checklist: Patient identified, Emergency Drugs available, Suction available, Patient being monitored and Timeout performed Patient Re-evaluated:Patient Re-evaluated prior to inductionOxygen Delivery Method: Circle system utilized Preoxygenation: Pre-oxygenation with 100% oxygen Intubation Type: IV induction Laryngoscope Size: Mac and 3 Grade View: Grade I Tube type: Oral Tube size: 7.0 mm Number of attempts: 1 Airway Equipment and Method: Stylet Placement Confirmation: ETT inserted through vocal cords under direct vision,  positive ETCO2 and breath sounds checked- equal and bilateral Secured at: 21 cm Tube secured with: Tape Dental Injury: Teeth and Oropharynx as per pre-operative assessment

## 2014-11-23 NOTE — Progress Notes (Signed)
Dr. Barbarann Ehlers aware of patient's EKG  Readings- Atrial Fibrillation- O.K. For patient to use nasal oxygen in Short Stay if patient feels she needs it.

## 2014-11-23 NOTE — Progress Notes (Signed)
EKG ordered and done per Dr. Thomes Cake. Stated he will assess patient in holding.

## 2014-11-23 NOTE — Progress Notes (Signed)
Patient c/o tightness in her chest about a 4/10. She states this is normal for her. No distress noted patient on o2 at 2 L  Per [place]. Warm, dry and pink. Anesthesia paged.

## 2014-11-23 NOTE — Interval H&P Note (Signed)
History and Physical Interval Note:  11/23/2014 9:09 AM  [name]  has presented today for surgery, with the diagnosis of GROSS HEMATURIA URETERAL NEOPLASM  The various methods of treatment have been discussed with the patient and family. After consideration of risks, benefits and other options for treatment, the patient has consented to  Procedure(s): CYSTOSCOPY RIGHT URETEROSCOPY BIOPSY AND STENT PLACEMENT (Right) as a surgical intervention .  The patient's history has been reviewed, patient examined, no change in status, stable for surgery.  I have reviewed the patient's chart and labs. She has had no dysuria. Gross hematuria resolved a few weeks ago. No "UTI or yeast infections".  Questions were answered to the patient's satisfaction.     Christean Silvestri

## 2014-11-23 NOTE — Progress Notes (Signed)
OK to resume Xarelto tomorrow (11/24/14) per Dr. Junious Silk.

## 2014-11-26 ENCOUNTER — Other Ambulatory Visit: Payer: Self-pay | Admitting: Urology

## 2014-11-26 NOTE — Patient Instructions (Addendum)
[name]  11/26/2014   Your procedure is scheduled on: 12/04/2014    Report to [facility] Main  Entrance take [place]  elevators to 3rd floor to  [place] at    [place] AM.  Call this number if you have problems the morning of surgery [phone]   Remember: ONLY 1 PERSON MAY GO WITH YOU TO SHORT STAY TO GET  READY MORNING OF [place].  Do not eat food or drink liquids :After Midnight.             Eat a good healthy snack prior to bedtime.              Take 1/2 of evening dose of Insulin nite before surgery.      Take these medicines the morning of surgery with A SIP OF WATER:  Amiodarone ( pacerone), Zyrtec if needed, Clonazepam if needed, Diltiazem( Cardiazem), Prevacid, Synthroid, Metoprolol ( Lopressor)  DO NOT TAKE ANY DIABETIC MEDICATIONS DAY OF YOUR SURGERY                               You may not have any metal on your body including hair pins and              piercings  Do not wear jewelry, make-up, lotions, powders or perfumes, deodorant             Do not wear nail polish.  Do not shave  48 hours prior to surgery.                 Do not bring valuables to the hospital. [place].  Contacts, dentures or bridgework may not be worn into surgery.  Leave suitcase in the car. After surgery it may be brought to your room.        Special Instructions: coughing and deep breathing exercises, leg exercises               Please read over the following fact sheets you were given: _____________________________________________________________________             [facility] - Preparing for Surgery Before surgery, you can play an important role.  Because skin is not sterile, your skin needs to be as free of germs as possible.  You can reduce the number of germs on your skin by washing with CHG (chlorahexidine gluconate) soap before surgery.  CHG is an antiseptic cleaner which kills germs and  bonds with the skin to continue killing germs even after washing. Please DO NOT use if you have an allergy to CHG or antibacterial soaps.  If your skin becomes reddened/irritated stop using the CHG and inform your nurse when you arrive at Short Stay. Do not shave (including legs and underarms) for at least 48 hours prior to the first CHG shower.  You may shave your face/neck. Please follow these instructions carefully:  1.  Shower with CHG Soap the night before surgery and the  morning of Surgery.  2.  If you choose to wash your hair, wash your hair first as usual with your  normal  shampoo.  3.  After you shampoo, rinse your hair and body thoroughly to remove the  shampoo.  4.  Use CHG as you would any other liquid soap.  You can apply chg directly  to the skin and wash                       Gently with a scrungie or clean washcloth.  5.  Apply the CHG Soap to your body ONLY FROM THE NECK DOWN.   Do not use on face/ open                           Wound or open sores. Avoid contact with eyes, ears mouth and genitals (private parts).                       Wash face,  Genitals (private parts) with your normal soap.             6.  Wash thoroughly, paying special attention to the area where your surgery  will be performed.  7.  Thoroughly rinse your body with warm water from the neck down.  8.  DO NOT shower/wash with your normal soap after using and rinsing off  the CHG Soap.                9.  Pat yourself dry with a clean towel.            10.  Wear clean pajamas.            11.  Place clean sheets on your bed the night of your first shower and do not  sleep with pets. Day of Surgery : Do not apply any lotions/deodorants the morning of surgery.  Please wear clean clothes to the hospital/surgery center.  FAILURE TO FOLLOW THESE INSTRUCTIONS MAY RESULT IN THE CANCELLATION OF YOUR SURGERY PATIENT SIGNATURE_________________________________  NURSE  SIGNATURE__________________________________  ________________________________________________________________________

## 2014-11-28 ENCOUNTER — Encounter (HOSPITAL_COMMUNITY)
Admission: RE | Admit: 2014-11-28 | Discharge: 2014-11-28 | Disposition: A | Discharge status: Home / Self Care | Payer: Medicare HMO | Source: Ambulatory Visit | Attending: Urology | Admitting: Urology

## 2014-11-28 ENCOUNTER — Encounter (HOSPITAL_COMMUNITY): Payer: Self-pay

## 2014-11-28 DIAGNOSIS — R310 Gross hematuria: Secondary | ICD-10-CM | POA: Insufficient documentation

## 2014-11-28 DIAGNOSIS — Z01818 Encounter for other preprocedural examination: Secondary | ICD-10-CM | POA: Diagnosis present

## 2014-11-28 HISTORY — DX: Dependence on supplemental oxygen: Z99.81

## 2014-11-28 LAB — CBC
HEMATOCRIT: 35.8 % — AB (ref 36.0–46.0)
Hemoglobin: 10.2 g/dL — ABNORMAL LOW (ref 12.0–15.0)
MCH: 21.8 pg — ABNORMAL LOW (ref 26.0–34.0)
MCHC: 28.5 g/dL — ABNORMAL LOW (ref 30.0–36.0)
MCV: 76.7 fL — AB (ref 78.0–100.0)
Platelets: 314 10*3/uL (ref 150–400)
RBC: 4.67 MIL/uL (ref 3.87–5.11)
RDW: 18.5 % — AB (ref 11.5–15.5)
WBC: 11.4 10*3/uL — AB (ref 4.0–10.5)

## 2014-11-28 LAB — BASIC METABOLIC PANEL
ANION GAP: 11 (ref 5–15)
BUN: 17 mg/dL (ref 6–20)
CHLORIDE: 94 mmol/L — AB (ref 101–111)
CO2: 34 mmol/L — ABNORMAL HIGH (ref 22–32)
CREATININE: 1.59 mg/dL — AB (ref 0.44–1.00)
Calcium: 9.5 mg/dL (ref 8.9–10.3)
GFR calc Af Amer: 36 mL/min — ABNORMAL LOW (ref >60)
GFR, EST NON AFRICAN AMERICAN: 31 mL/min — AB (ref >60)
Glucose, Bld: 114 mg/dL — ABNORMAL HIGH (ref 65–99)
POTASSIUM: 3.9 mmol/L (ref 3.5–5.1)
Sodium: 139 mmol/L (ref 135–145)

## 2014-11-28 NOTE — Progress Notes (Signed)
CBC results done 11/28/14 faxed via EPIC to Dr Junious Silk.

## 2014-11-28 NOTE — Progress Notes (Signed)
Recent surgery- 11/23/2014-  CXR- 10/23/14- [facility]  EKG- 11/23/2014 - [facility]  Dr [name]- 11/06/14- clearance on chart  Last device check- patient has a loop recorder-10/16/14- [facility]  07/04/2014- LOV- Dr Peter [name]- [facility]

## 2014-11-28 NOTE — Progress Notes (Addendum)
BMP done  11/28/2014 faxed via EPIC to Dr Junious Silk.

## 2014-11-28 NOTE — Progress Notes (Signed)
Spoke with Darrel Reach at [facility] .  She stated that per Dr Junious Silk patient could stay on Xarelto for procedure.  When asked at preop appointment patient stated she was instructed she could stay on Xarelto for procedure.

## 2014-11-30 ENCOUNTER — Encounter (HOSPITAL_COMMUNITY): Payer: Self-pay

## 2014-11-30 NOTE — Progress Notes (Signed)
Reverified with patient that she was instructed by Dr Junious Silk to remain on Xarelto prior to procedure.  And also verified on 1l of oxygen at rest and 2L with exertion.

## 2014-12-03 ENCOUNTER — Encounter (HOSPITAL_COMMUNITY): Payer: Self-pay | Admitting: Anesthesiology

## 2014-12-03 MED ORDER — GENTAMICIN SULFATE 40 MG/ML IJ SOLN
360.0000 mg | INTRAVENOUS | Status: DC
  Filled 2014-12-03: qty 9

## 2014-12-03 NOTE — Anesthesia Preprocedure Evaluation (Deleted)
Anesthesia Evaluation  Patient identified by MRN, date of birth, ID band Patient awake    Reviewed: Allergy & Precautions, NPO status , Patient's Chart, lab work & pertinent test results  History of Anesthesia Complications (+) history of anesthetic complications  Airway Mallampati: II  TM Distance: >3 FB Neck ROM: Full    Dental no notable dental hx.    Pulmonary shortness of breath, asthma ,    Pulmonary exam normal breath sounds clear to auscultation       Cardiovascular hypertension, Pt. on medications and Pt. on home beta blockers + CAD, + Past MI, + Peripheral Vascular Disease and +CHF  Normal cardiovascular exam+ dysrhythmias  Rhythm:Regular Rate:Normal     Neuro/Psych PSYCHIATRIC DISORDERS Anxiety Depression  Neuromuscular disease    GI/Hepatic Neg liver ROS, hiatal hernia, GERD  ,  Endo/Other  diabetes, Type 2, Oral Hypoglycemic Agents, Insulin DependentHypothyroidism Morbid obesity  Renal/GU Renal disease  negative genitourinary   Musculoskeletal  (+) Arthritis ,   Abdominal (+) + obese,   Peds negative pediatric ROS (+)  Hematology negative hematology ROS (+)   Anesthesia Other Findings   Reproductive/Obstetrics negative OB ROS                             Anesthesia Physical Anesthesia Plan  ASA: II  Anesthesia Plan: General   Post-op Pain Management:    Induction: Intravenous  Airway Management Planned: Oral ETT  Additional Equipment:   Intra-op Plan:   Post-operative Plan: Extubation in OR  Informed Consent: I have reviewed the patients History and Physical, chart, labs and discussed the procedure including the risks, benefits and alternatives for the proposed anesthesia with the patient or authorized representative who has indicated his/her understanding and acceptance.   Dental advisory given  Plan Discussed with: CRNA  Anesthesia Plan Comments:          Anesthesia Quick Evaluation

## 2014-12-04 ENCOUNTER — Ambulatory Visit (HOSPITAL_COMMUNITY)
Admission: RE | Admit: 2014-12-04 | Discharge: 2014-12-04 | Disposition: A | Discharge status: Home / Self Care | Payer: Medicare HMO | Source: Ambulatory Visit | Attending: Urology | Admitting: Urology

## 2014-12-04 ENCOUNTER — Encounter (HOSPITAL_COMMUNITY)
Admission: RE | Disposition: A | Discharge status: Home / Self Care | Payer: Self-pay | Source: Ambulatory Visit | Attending: Urology

## 2014-12-04 ENCOUNTER — Encounter (HOSPITAL_COMMUNITY): Payer: Self-pay | Admitting: *Deleted

## 2014-12-04 DIAGNOSIS — Z5309 Procedure and treatment not carried out because of other contraindication: Secondary | ICD-10-CM | POA: Insufficient documentation

## 2014-12-04 DIAGNOSIS — R310 Gross hematuria: Secondary | ICD-10-CM | POA: Diagnosis present

## 2014-12-04 LAB — URINALYSIS, ROUTINE W REFLEX MICROSCOPIC
GLUCOSE, UA: NEGATIVE mg/dL
KETONES UR: NEGATIVE mg/dL
Nitrite: NEGATIVE
PH: 5 (ref 5.0–8.0)
Protein, ur: 30 mg/dL — AB
SPECIFIC GRAVITY, URINE: 1.018 (ref 1.005–1.030)
Urobilinogen, UA: 0.2 mg/dL (ref 0.0–1.0)

## 2014-12-04 LAB — URINE MICROSCOPIC-ADD ON

## 2014-12-04 LAB — GLUCOSE, CAPILLARY: GLUCOSE-CAPILLARY: 112 mg/dL — AB (ref 65–99)

## 2014-12-04 SURGERY — CYSTOURETEROSCOPY, WITH STENT INSERTION
Anesthesia: General | Laterality: Right

## 2014-12-04 MED ORDER — CLINDAMYCIN PHOSPHATE 900 MG/50ML IV SOLN: 900.0000 mg | Freq: Once | INTRAVENOUS | Status: DC

## 2014-12-04 MED ORDER — FENTANYL CITRATE (PF) 100 MCG/2ML IJ SOLN
INTRAMUSCULAR | Status: AC
  Filled 2014-12-04: qty 4

## 2014-12-04 MED ORDER — ONDANSETRON HCL 4 MG/2ML IJ SOLN
INTRAMUSCULAR | Status: AC
  Filled 2014-12-04: qty 2

## 2014-12-04 MED ORDER — LIDOCAINE HCL (CARDIAC) 20 MG/ML IV SOLN
INTRAVENOUS | Status: AC
  Filled 2014-12-04: qty 5

## 2014-12-04 MED ORDER — SODIUM CHLORIDE 0.9 % IJ SOLN
INTRAMUSCULAR | Status: AC
  Filled 2014-12-04: qty 10

## 2014-12-04 MED ORDER — PROPOFOL 10 MG/ML IV BOLUS
INTRAVENOUS | Status: AC
  Filled 2014-12-04: qty 20

## 2014-12-04 MED ORDER — EPHEDRINE SULFATE 50 MG/ML IJ SOLN
INTRAMUSCULAR | Status: AC
  Filled 2014-12-04: qty 1

## 2014-12-04 NOTE — Progress Notes (Signed)
Pt took xarelto last night at 2000, [place] in [place] holding notified, she advised me to call Dr Junious Silk. Page placed, awaiting return call

## 2014-12-04 NOTE — Progress Notes (Signed)
[facility] summary report received. Battery status OK. Normal device function. No new symptom episodes, tachy episodes, brady, or pause episodes. 404 AF episodes (burden 45%), +Xarelto, avg V rate well controlled. Monthly summary reports and ROV with JA on 12/12/14 at 9:00am.

## 2014-12-05 ENCOUNTER — Other Ambulatory Visit: Payer: Self-pay | Admitting: Urology

## 2014-12-05 ENCOUNTER — Encounter (HOSPITAL_COMMUNITY): Payer: Self-pay | Admitting: *Deleted

## 2014-12-05 ENCOUNTER — Other Ambulatory Visit (HOSPITAL_COMMUNITY): Payer: Self-pay | Admitting: *Deleted

## 2014-12-05 LAB — URINE CULTURE: Culture: NO GROWTH

## 2014-12-06 ENCOUNTER — Encounter (HOSPITAL_COMMUNITY): Payer: Self-pay | Admitting: Anesthesiology

## 2014-12-06 MED ORDER — DEXTROSE 5 % IV SOLN
320.0000 mg | INTRAVENOUS | Status: DC
  Filled 2014-12-06: qty 8

## 2014-12-06 NOTE — Anesthesia Preprocedure Evaluation (Addendum)
Anesthesia Evaluation  Patient identified by MRN, date of birth, ID band Patient awake    Reviewed: Allergy & Precautions, NPO status , Patient's Chart, lab work & pertinent test results  History of Anesthesia Complications (+) history of anesthetic complications  Airway Mallampati: II  TM Distance: >3 FB Neck ROM: Full    Dental  (+) Dental Advisory Given, Poor Dentition, Missing Missing many teeth.:   Pulmonary shortness of breath, with exertion and Long-Term Oxygen Therapy, asthma ,    Pulmonary exam normal breath sounds clear to auscultation       Cardiovascular hypertension, Pt. on medications and Pt. on home beta blockers + CAD, + Past MI, + Peripheral Vascular Disease and +CHF  Normal cardiovascular exam+ dysrhythmias  Rhythm:Regular Rate:Normal     Neuro/Psych PSYCHIATRIC DISORDERS Anxiety Depression  Neuromuscular disease    GI/Hepatic Neg liver ROS, hiatal hernia, GERD  Medicated,  Endo/Other  diabetes, Type 2, Oral Hypoglycemic Agents, Insulin DependentHypothyroidism Morbid obesity  Renal/GU Renal diseaseLast Cr 1.59 K 3.9  negative genitourinary   Musculoskeletal  (+) Arthritis ,   Abdominal (+) + obese,   Peds negative pediatric ROS (+)  Hematology negative hematology ROS (+)   Anesthesia Other Findings   Reproductive/Obstetrics negative OB ROS                          Anesthesia Physical Anesthesia Plan  ASA: III  Anesthesia Plan: General   Post-op Pain Management:    Induction: Intravenous  Airway Management Planned: Oral ETT  Additional Equipment:   Intra-op Plan:   Post-operative Plan: Extubation in OR  Informed Consent: I have reviewed the patients History and Physical, chart, labs and discussed the procedure including the risks, benefits and alternatives for the proposed anesthesia with the patient or authorized representative who has indicated his/her  understanding and acceptance.   Dental advisory given  Plan Discussed with: CRNA  Anesthesia Plan Comments: (Did OK with last general a few weeks ago except for PONV)       Anesthesia Quick Evaluation

## 2014-12-07 ENCOUNTER — Encounter (HOSPITAL_COMMUNITY): Payer: Self-pay | Admitting: *Deleted

## 2014-12-07 ENCOUNTER — Ambulatory Visit (HOSPITAL_COMMUNITY): Payer: Medicare HMO | Admitting: Anesthesiology

## 2014-12-07 ENCOUNTER — Telehealth: Payer: Self-pay | Admitting: Internal Medicine

## 2014-12-07 ENCOUNTER — Encounter (HOSPITAL_COMMUNITY)
Admission: RE | Disposition: A | Discharge status: Home / Self Care | Payer: Self-pay | Source: Ambulatory Visit | Attending: Urology

## 2014-12-07 ENCOUNTER — Ambulatory Visit (HOSPITAL_COMMUNITY)
Admission: RE | Admit: 2014-12-07 | Discharge: 2014-12-07 | Disposition: A | Discharge status: Home / Self Care | Payer: Medicare HMO | Source: Ambulatory Visit | Attending: Urology | Admitting: Urology

## 2014-12-07 DIAGNOSIS — E785 Hyperlipidemia, unspecified: Secondary | ICD-10-CM | POA: Insufficient documentation

## 2014-12-07 DIAGNOSIS — I429 Cardiomyopathy, unspecified: Secondary | ICD-10-CM | POA: Diagnosis not present

## 2014-12-07 DIAGNOSIS — C651 Malignant neoplasm of right renal pelvis: Secondary | ICD-10-CM | POA: Insufficient documentation

## 2014-12-07 DIAGNOSIS — I110 Hypertensive heart disease with heart failure: Secondary | ICD-10-CM | POA: Insufficient documentation

## 2014-12-07 DIAGNOSIS — M1990 Unspecified osteoarthritis, unspecified site: Secondary | ICD-10-CM | POA: Insufficient documentation

## 2014-12-07 DIAGNOSIS — I252 Old myocardial infarction: Secondary | ICD-10-CM | POA: Diagnosis not present

## 2014-12-07 DIAGNOSIS — K3184 Gastroparesis: Secondary | ICD-10-CM | POA: Insufficient documentation

## 2014-12-07 DIAGNOSIS — E039 Hypothyroidism, unspecified: Secondary | ICD-10-CM | POA: Diagnosis not present

## 2014-12-07 DIAGNOSIS — G709 Myoneural disorder, unspecified: Secondary | ICD-10-CM | POA: Insufficient documentation

## 2014-12-07 DIAGNOSIS — Z6841 Body Mass Index (BMI) 40.0 and over, adult: Secondary | ICD-10-CM | POA: Diagnosis not present

## 2014-12-07 DIAGNOSIS — E1143 Type 2 diabetes mellitus with diabetic autonomic (poly)neuropathy: Secondary | ICD-10-CM | POA: Diagnosis not present

## 2014-12-07 DIAGNOSIS — K449 Diaphragmatic hernia without obstruction or gangrene: Secondary | ICD-10-CM | POA: Diagnosis not present

## 2014-12-07 DIAGNOSIS — Z7901 Long term (current) use of anticoagulants: Secondary | ICD-10-CM | POA: Diagnosis not present

## 2014-12-07 DIAGNOSIS — I509 Heart failure, unspecified: Secondary | ICD-10-CM | POA: Diagnosis not present

## 2014-12-07 DIAGNOSIS — R310 Gross hematuria: Secondary | ICD-10-CM | POA: Diagnosis present

## 2014-12-07 DIAGNOSIS — Z79899 Other long term (current) drug therapy: Secondary | ICD-10-CM | POA: Diagnosis not present

## 2014-12-07 DIAGNOSIS — Z794 Long term (current) use of insulin: Secondary | ICD-10-CM | POA: Insufficient documentation

## 2014-12-07 DIAGNOSIS — J45909 Unspecified asthma, uncomplicated: Secondary | ICD-10-CM | POA: Insufficient documentation

## 2014-12-07 DIAGNOSIS — K219 Gastro-esophageal reflux disease without esophagitis: Secondary | ICD-10-CM | POA: Diagnosis not present

## 2014-12-07 DIAGNOSIS — I2510 Atherosclerotic heart disease of native coronary artery without angina pectoris: Secondary | ICD-10-CM | POA: Insufficient documentation

## 2014-12-07 DIAGNOSIS — E1151 Type 2 diabetes mellitus with diabetic peripheral angiopathy without gangrene: Secondary | ICD-10-CM | POA: Diagnosis not present

## 2014-12-07 DIAGNOSIS — E1140 Type 2 diabetes mellitus with diabetic neuropathy, unspecified: Secondary | ICD-10-CM | POA: Diagnosis not present

## 2014-12-07 DIAGNOSIS — I4891 Unspecified atrial fibrillation: Secondary | ICD-10-CM | POA: Diagnosis not present

## 2014-12-07 HISTORY — DX: Malignant neoplasm of right renal pelvis: C65.1

## 2014-12-07 HISTORY — PX: CYSTOSCOPY WITH URETEROSCOPY AND STENT PLACEMENT: SHX6377

## 2014-12-07 LAB — GLUCOSE, CAPILLARY
Glucose-Capillary: 144 mg/dL — ABNORMAL HIGH (ref 65–99)
Glucose-Capillary: 102 mg/dL — ABNORMAL HIGH (ref 65–99)
Glucose-Capillary: 120 mg/dL — ABNORMAL HIGH (ref 65–99)

## 2014-12-07 SURGERY — CYSTOURETEROSCOPY, WITH STENT INSERTION
Anesthesia: General | Laterality: Right

## 2014-12-07 MED ORDER — CLINDAMYCIN PHOSPHATE 900 MG/50ML IV SOLN
INTRAVENOUS | Status: AC
  Filled 2014-12-07: qty 50

## 2014-12-07 MED ORDER — PHENAZOPYRIDINE HCL 200 MG PO TABS
200.0000 mg | ORAL_TABLET | Freq: Once | ORAL | Status: AC
  Administered 2014-12-07: 200 mg via ORAL

## 2014-12-07 MED ORDER — PHENAZOPYRIDINE HCL 200 MG PO TABS: 200.0000 mg | ORAL_TABLET | Freq: Three times a day (TID) | ORAL | Status: DC | PRN

## 2014-12-07 MED ORDER — PHENYLEPHRINE HCL 10 MG/ML IJ SOLN
INTRAMUSCULAR | Status: DC | PRN
  Administered 2014-12-07 (×6): 80 ug via INTRAVENOUS

## 2014-12-07 MED ORDER — EPHEDRINE SULFATE 50 MG/ML IJ SOLN
INTRAMUSCULAR | Status: AC
  Filled 2014-12-07: qty 1

## 2014-12-07 MED ORDER — LIDOCAINE HCL (CARDIAC) 20 MG/ML IV SOLN
INTRAVENOUS | Status: DC | PRN
  Administered 2014-12-07: 50 mg via INTRAVENOUS

## 2014-12-07 MED ORDER — TORSEMIDE 20 MG PO TABS
60.0000 mg | ORAL_TABLET | Freq: Once | ORAL | Status: AC
  Administered 2014-12-07: 60 mg via ORAL
  Filled 2014-12-07: qty 3

## 2014-12-07 MED ORDER — OXYBUTYNIN CHLORIDE 5 MG PO TABS
5.0000 mg | ORAL_TABLET | Freq: Once | ORAL | Status: AC
  Administered 2014-12-07: 5 mg via ORAL

## 2014-12-07 MED ORDER — PROPOFOL 10 MG/ML IV BOLUS
INTRAVENOUS | Status: AC
  Filled 2014-12-07: qty 20

## 2014-12-07 MED ORDER — SODIUM CHLORIDE 0.9 % IJ SOLN
INTRAMUSCULAR | Status: AC
  Filled 2014-12-07: qty 10

## 2014-12-07 MED ORDER — ONDANSETRON HCL 4 MG/2ML IJ SOLN
INTRAMUSCULAR | Status: DC | PRN
Start: 2014-12-07 — End: 2014-12-07
  Administered 2014-12-07: 4 mg via INTRAVENOUS

## 2014-12-07 MED ORDER — PHENAZOPYRIDINE HCL 200 MG PO TABS
ORAL_TABLET | ORAL | Status: DC
Start: 2014-12-07 — End: 2014-12-07
  Filled 2014-12-07: qty 1

## 2014-12-07 MED ORDER — FENTANYL CITRATE (PF) 100 MCG/2ML IJ SOLN
INTRAMUSCULAR | Status: AC
  Filled 2014-12-07: qty 2

## 2014-12-07 MED ORDER — CLINDAMYCIN PHOSPHATE 900 MG/50ML IV SOLN
900.0000 mg | INTRAVENOUS | Status: AC
  Administered 2014-12-07: 900 mg via INTRAVENOUS

## 2014-12-07 MED ORDER — PHENYLEPHRINE 40 MCG/ML (10ML) SYRINGE FOR IV PUSH (FOR BLOOD PRESSURE SUPPORT)
PREFILLED_SYRINGE | INTRAVENOUS | Status: AC
  Filled 2014-12-07: qty 10

## 2014-12-07 MED ORDER — TRAMADOL HCL 50 MG PO TABS: 50.0000 mg | ORAL_TABLET | Freq: Four times a day (QID) | ORAL | Status: DC | PRN

## 2014-12-07 MED ORDER — ROCURONIUM BROMIDE 100 MG/10ML IV SOLN
INTRAVENOUS | Status: AC
  Filled 2014-12-07: qty 1

## 2014-12-07 MED ORDER — EPHEDRINE SULFATE 50 MG/ML IJ SOLN
INTRAMUSCULAR | Status: DC | PRN
  Administered 2014-12-07 (×2): 10 mg via INTRAVENOUS

## 2014-12-07 MED ORDER — FENTANYL CITRATE (PF) 100 MCG/2ML IJ SOLN
25.0000 ug | INTRAMUSCULAR | Status: DC | PRN
  Administered 2014-12-07: 25 ug via INTRAVENOUS

## 2014-12-07 MED ORDER — LACTATED RINGERS IV SOLN
INTRAVENOUS | Status: DC | PRN
  Administered 2014-12-07 (×2): via INTRAVENOUS

## 2014-12-07 MED ORDER — METOCLOPRAMIDE HCL 5 MG/ML IJ SOLN
INTRAMUSCULAR | Status: DC | PRN
  Administered 2014-12-07: 10 mg via INTRAVENOUS

## 2014-12-07 MED ORDER — ONDANSETRON HCL 4 MG/2ML IJ SOLN
INTRAMUSCULAR | Status: AC
  Filled 2014-12-07: qty 2

## 2014-12-07 MED ORDER — OXYBUTYNIN CHLORIDE 5 MG PO TABS
ORAL_TABLET | ORAL | Status: AC
  Filled 2014-12-07: qty 1

## 2014-12-07 MED ORDER — FENTANYL CITRATE (PF) 100 MCG/2ML IJ SOLN
INTRAMUSCULAR | Status: DC | PRN
Start: 2014-12-07 — End: 2014-12-07
  Administered 2014-12-07: 25 ug via INTRAVENOUS

## 2014-12-07 MED ORDER — LIDOCAINE HCL (CARDIAC) 20 MG/ML IV SOLN
INTRAVENOUS | Status: AC
  Filled 2014-12-07: qty 10

## 2014-12-07 MED ORDER — PROPOFOL 10 MG/ML IV BOLUS
INTRAVENOUS | Status: DC | PRN
  Administered 2014-12-07: 100 mg via INTRAVENOUS

## 2014-12-07 MED ORDER — FENTANYL CITRATE (PF) 100 MCG/2ML IJ SOLN
INTRAMUSCULAR | Status: AC
  Filled 2014-12-07: qty 4

## 2014-12-07 MED ORDER — METOCLOPRAMIDE HCL 5 MG/ML IJ SOLN
INTRAMUSCULAR | Status: AC
  Filled 2014-12-07: qty 2

## 2014-12-07 MED ORDER — PROMETHAZINE HCL 25 MG/ML IJ SOLN: 6.2500 mg | INTRAMUSCULAR | Status: DC | PRN

## 2014-12-07 MED ORDER — TAMSULOSIN HCL 0.4 MG PO CAPS
0.4000 mg | ORAL_CAPSULE | Freq: Once | ORAL | Status: AC
  Administered 2014-12-07: 0.4 mg via ORAL
  Filled 2014-12-07: qty 1

## 2014-12-07 MED ORDER — PHENYLEPHRINE HCL 10 MG/ML IJ SOLN
INTRAMUSCULAR | Status: AC
  Filled 2014-12-07: qty 1

## 2014-12-07 MED ORDER — PROPOFOL 10 MG/ML IV BOLUS
INTRAVENOUS | Status: AC
Start: 2014-12-07 — End: 2014-12-07
  Filled 2014-12-07: qty 20

## 2014-12-07 MED ORDER — SUCCINYLCHOLINE CHLORIDE 20 MG/ML IJ SOLN
INTRAMUSCULAR | Status: DC | PRN
  Administered 2014-12-07: 100 mg via INTRAVENOUS

## 2014-12-07 SURGICAL SUPPLY — 19 items
BAG URO CATCHER STRL LF (DRAPE) ×2 IMPLANT
BASKET ZERO TIP NITINOL 2.4FR (BASKET) ×1 IMPLANT
BSKT STON RTRVL ZERO TP 2.4FR (BASKET) ×1
CATH INTERMIT  6FR 70CM (CATHETERS) ×2 IMPLANT
FIBER LASER FLEXIVA 1000 (UROLOGICAL SUPPLIES) IMPLANT
FIBER LASER FLEXIVA 200 (UROLOGICAL SUPPLIES) IMPLANT
FIBER LASER FLEXIVA 365 (UROLOGICAL SUPPLIES) IMPLANT
FIBER LASER FLEXIVA 550 (UROLOGICAL SUPPLIES) IMPLANT
FIBER LASER TRAC TIP (UROLOGICAL SUPPLIES) IMPLANT
GLOVE BIOGEL M 8.0 STRL (GLOVE) ×2 IMPLANT
GOWN STRL REUS W/ TWL XL LVL3 (GOWN DISPOSABLE) ×1 IMPLANT
GOWN STRL REUS W/TWL XL LVL3 (GOWN DISPOSABLE) ×4 IMPLANT
GUIDEWIRE ANG ZIPWIRE 038X150 (WIRE) IMPLANT
GUIDEWIRE STR DUAL SENSOR (WIRE) ×1 IMPLANT
MANIFOLD NEPTUNE II (INSTRUMENTS) ×2 IMPLANT
PACK CYSTO (CUSTOM PROCEDURE TRAY) ×2 IMPLANT
SHEATH ACCESS URETERAL 38CM (SHEATH) ×1 IMPLANT
STENT URET 6FRX24 CONTOUR (STENTS) ×1 IMPLANT
TUBING CONNECTING 10 (TUBING) ×2 IMPLANT

## 2014-12-07 NOTE — Progress Notes (Signed)
Patient states she is breathing easy-does not feel short of breath now-Dr. Delma Post had said she could go back to Short Stay after receiving home medication-fluid pill

## 2014-12-07 NOTE — Telephone Encounter (Signed)
Informed pt daughter that appt is from 09-11-14 ablation. Pt daughter verbalized understanding.

## 2014-12-07 NOTE — Interval H&P Note (Signed)
History and Physical Interval Note:  12/07/2014 6:56 AM    Patient took her Xarelto Monday night, therefore I had to reschedule her Tuesday morning ureteroscopy and biopsy.  She had a very tight proximal ureter and it might require balloon dilation.  She was pre-stented.  Also she might need biopsy of the collecting system.  She also had dysuria.  I sent a urine culture and it was negative.   The daughter was very upset understandably that we had to reschedule but I told her I had to do a safe for her mom.  We have checked the OR schedule for Friday and next week and the soonest we could get her into the OR is 7:30 Friday morning.  I have 3 cancer patients to present at multidisciplinary tumor Board, therefore one of my partners Dr. Karsten Ro has agreed to do the procedure.  I discussed this with the patient's daughter and she was happy to have the procedure kept on Friday and not delayed until next week and to have Dr. Karsten Ro do the procedure.   [name]  has presented today for surgery, with the diagnosis of GROSS HEMATURIA   The various methods of treatment have been discussed with the patient and family. After consideration of risks, benefits and other options for treatment, the patient has consented to  Procedure(s): CYSTOSCOPY RIGHT URETEROSCOPY BIOPSY AND STENT PLACEMENT (Right) HOLMIUM LASER  (Right) as a surgical intervention .  The patient's history has been reviewed, patient examined, no change in status, stable for surgery.  I have reviewed the patient's chart and labs.  Questions were answered to the patient's satisfaction.     Claybon Jabs

## 2014-12-07 NOTE — H&P (View-Only) (Signed)
History of Present Illness F/u - Cards Dr. Rayann Heman, PCP Dr Melina Copa     1 - gross hematuria - about two days ago pt voided and noted red or cranberry urine. No clots. Her hgb 11, hct 38 which was higher than last month when hgb was 10.5, hct 34. UA tntc rbc's, rare bac. Bun 14, cr 1.37 (gfr 38) - Sept 26, 2016. Non-con CT - benign, renal atrophy. Obese. I reviewed images. Fullness RLP collecting system. Non-smoker. No exposure risk although she did work in [employer], [employer].   -Sep 2016 - cystoscopy normal - bloody efflux noted from right ureteral orifice    2- LUTS - No dysuria. Usually voids with crede, bending over. Has hesitancy. NG risk includes DM and PN.  -Sep 2016 PVR 100 ml (cathed)    PMH -- H/o afib on Xarelto. Recent ablation. Dr. Rayann Heman recommended staying on Xarelto at least for 8 weeks -- Oct 16.        Oct 2016 int hx  Returns in continued management of gross hematuria. She underwent a CT scan of the abdomen and pelvis and as I suspected there was potentially an enhancing lesion in the right lower pole collecting system. Her vitals are stable but she's felt tired and weak and they would like to check a hemoglobin and hematocrit. She had labs drawn earlier but for some reason his CBC wasn't done. Fortunately, the gross hematuria has cleared. UA today shows microscopic hematuria.   Past Medical History Problems  1. History of Anxiety (F41.9) 2. History of CAD (coronary artery disease) (I25.10) 3. History of Diverticulosis (K57.90) 4. History of Gastroparesis (K31.84) 5. History of adenomatous polyp of colon (Z86.010) 6. History of asthma (Z87.09) 7. History of atrial fibrillation (Z86.79) 8. History of atrial flutter (Z86.79) 9. History of Bell's palsy (Z86.69) 10. History of degenerative disc disease (Z87.39) 11. History of depression (Z86.59) 12. History of diabetes mellitus (Z86.39) 13. History of esophageal reflux (Z87.19) 14. History of hemorrhoids  (Z87.19) 15. History of hiatal hernia (Z87.19) 16. History of hyperlipidemia (Z86.39) 17. History of hypothyroidism (Z86.39) 18. History of irritable bowel syndrome (Z87.19) 19. History of neuropathy (Z86.69) 20. History of obesity (Z86.39) 21. History of osteoarthritis (Z87.39) 22. History of renal calculi (Z87.442) 23. History of syncope (Z87.898) 24. History of Nonischemic cardiomyopathy (I42.9) 25. History of Vitamin D deficiency (E55.9)  Surgical History Problems  1. History of Addit Cerebral Art Catheteriz - Aortic Appr Init Third Ord 2. History of Additional Abdominal Artery Catheterization - Via Aorta 3. History of Cesarean Section 4. History of Cholecystectomy 5. History of Elective Cardioversion 6. History of Esophageal Dilation 7. History of Facial Surgery 8. History of Hand Surgery 9. History of Neuroplasty Decompression Median Nerve At Carpal Tunnel 10. History of Nose Surgery 11. History of Total Abdominal Hysterectomy 12. History of Tubal Ligation  Current Meds 1. Amiodarone HCl - 200 MG Oral Tablet;  Therapy: (Recorded:27Sep2016) to Recorded 2. Benadryl TABS;  Therapy: (Recorded:27Sep2016) to Recorded 3. ClonazePAM 0.5 MG Oral Tablet;  Therapy: (Recorded:27Sep2016) to Recorded 4. Cyanocobalamin TABS;  Therapy: (Recorded:27Sep2016) to Recorded 5. Diltiazem HCl ER 240 MG Oral Capsule Extended Release 24 Hour;  Therapy: (Recorded:27Sep2016) to Recorded 6. Estradiol 1 MG Oral Tablet;  Therapy: (Recorded:27Sep2016) to Recorded 7. GlipiZIDE 10 MG Oral Tablet;  Therapy: (Recorded:27Sep2016) to Recorded 8. Lantus 100 UNIT/ML Subcutaneous Solution;  Therapy: (Recorded:27Sep2016) to Recorded 9. Levothyroxine Sodium 137 MCG Oral Tablet;  Therapy: (Recorded:27Sep2016) to Recorded 10. Magnesium Oxide 400 MG Oral Tablet;  Therapy: (Recorded:27Sep2016) to Recorded 11. MetFORMIN HCl - 500 MG Oral Tablet;   Therapy: (Recorded:27Sep2016) to Recorded 12. Metoprolol  Tartrate 50 MG Oral Tablet;   Therapy: (Recorded:27Sep2016) to Recorded 13. Potassium Chloride Crys ER 20 MEQ Oral Tablet Extended Release;   Therapy: (Recorded:27Sep2016) to Recorded 14. Prevacid 30 MG Oral Capsule Delayed Release;   Therapy: (Recorded:27Sep2016) to Recorded 15. Simvastatin 20 MG Oral Tablet;   Therapy: (Recorded:27Sep2016) to Recorded 16. Torsemide 20 MG Oral Tablet;   Therapy: (Recorded:27Sep2016) to Recorded 17. Tylenol CAPS;   Therapy: (Recorded:27Sep2016) to Recorded 18. Ultram 50 MG Oral Tablet;   Therapy: (Recorded:27Sep2016) to Recorded 19. Xarelto 20 MG Oral Tablet;   Therapy: (Recorded:27Sep2016) to Recorded 20. Zyrtec 10 MG TABS;   Therapy: (Recorded:27Sep2016) to Recorded  Allergies Medication  1. Adhesive Tape 1/2"x10yd TAPE 2. Avelox TABS 3. Blueberry Flavor LIQD 4. cefprozil 5. Cetacaine 6. dicyclomine 7. diltiazem 8. Hydrocodone-Acetaminophen CAPS 9. Imdur 10. Januvia TABS 11. Lasix TABS 12. Lipitor TABS 13. Losartan Potassium TABS 14. nitroglycerin 15. oxycodone 16. Penicillins 17. PredniSONE TABS 18. Tamiflu 19. vancomycin  Family History Problems  1. Family history of Colon cancer : Father, Son 2. Family history of diabetes mellitus (Z83.3) : Mother, Father, Sister 3. Family history of irritable bowel syndrome (Z83.79) : Sister 4. Family history of kidney stones (Z84.1) : Father 5. Family history of liver cancer (Z80.0) : Sister 55. Family history of malignant neoplasm of esophagus (Z80.0) : Father 7. Family history of malignant neoplasm of kidney (Z80.51) : Father 8. Family history of myocardial infarction (Z82.49) : Mother 59. Family history of myocarditis (Z82.49) : Brother 10. Family history of renal failure (Z84.1) : Father 5. Family history of Ovarian cancer : Sister  Social History Problems  1. Denied: History of Alcohol use 2. Daily caffeine consumption, 2-3 servings a day 3. Disabled 4. Divorced 5. Father  deceased 82. Mother deceased 8. Never a smoker 8. No alcohol use 9. Non-smoker (Z78.9) 10. Three children  Vitals Vital Signs [Data Includes: Last 1 Day]  Recorded: 04Oct2016 02:46PM  Blood Pressure: 119 / 75 Temperature: 97.8 F Heart Rate: 76  Physical Exam Constitutional: Well nourished and well developed . No acute distress.  Pulmonary: No respiratory distress and normal respiratory rhythm and effort.  Cardiovascular: Heart rate and rhythm are normal . No peripheral edema.  Neuro/Psych:. Mood and affect are appropriate.    Results/Data Urine [Data Includes: Last 1 Day]   04Oct2016  COLOR YELLOW   APPEARANCE CLEAR   SPECIFIC GRAVITY 1.015   pH 8.0   GLUCOSE NEGATIVE   BILIRUBIN NEGATIVE   KETONE NEGATIVE   BLOOD 3+   PROTEIN NEGATIVE   NITRITE NEGATIVE   LEUKOCYTE ESTERASE NEGATIVE   SQUAMOUS EPITHELIAL/HPF 0-5 HPF  WBC 0-5 WBC/HPF  RBC 3-10 RBC/HPF  BACTERIA FEW HPF  CRYSTALS NONE SEEN HPF  CASTS NONE SEEN LPF  Yeast NONE SEEN HPF   The following images/tracing/specimen were independently visualized:  CT.    Assessment Assessed  1. Gross hematuria (R31.0)  Plan Gross hematuria  1. HEMOGLOBIN & HEMATOCRIT; Status:Hold For - Specimen/Data Collection,Appointment;  Requested for:04Oct2016;  2. URINE CYTOLOGY; Status:Hold For - Specimen/Data Collection,Appointment;  Requested for:04Oct2016;  3. Follow-up Schedule Surgery Office  Follow-up  Status: Hold For - Appointment   Requested for: 04Oct2016 Health Maintenance  4. UA With REFLEX; [Do Not Release]; Status:Complete;   DoneKD:[id number] 02:39PM  Discussion/Summary    Gross hematuria-I discussed the patient with Dr. Rayann Heman last. We need to let  her cardiac nerves heal after the ablation and this will require continued Xarelto. Fortunately, the bleeding has subsided. I did send urine for cytology and discuss with the patient and her daughter the nature risks benefits and alternatives to ureteroscopy with stent  placement, biopsy. We discussed she may need a staged procedure as well as risks of bleeding, infection and cardiovascular risks among others.     Signatures Electronically signed by : Festus Aloe, M.D.; Oct 30 2014  3:16PM EST  Add: cytology was negative.

## 2014-12-07 NOTE — Transfer of Care (Signed)
Immediate Anesthesia Transfer of Care Note  Patient: [name]  Procedure(s) Performed: Procedure(s): CYSTOSCOPY RIGHT URETEROSCOPY, RIGHT RETROGRADE, BIOPSY AND STENT PLACEMENT (Right)  Patient Location: PACU  Anesthesia Type:General  Level of Consciousness:  sedated, patient cooperative and responds to stimulation  Airway & Oxygen Therapy:Patient Spontanous Breathing and Patient connected to face mask oxgen  Post-op Assessment:  Report given to PACU RN and Post -op Vital signs reviewed and stable  Post vital signs:  Reviewed and stable  Last Vitals:  Filed Vitals:   12/07/14 0511  BP: 104/55  Pulse: 78  Temp: 36.7 C  Resp: 16    Complications: No apparent anesthesia complications

## 2014-12-07 NOTE — Discharge Instructions (Signed)
Post renal biopsy/stent placement surgery instructions   Definitions:  Ureter: The duct that transports urine from the kidney to the bladder. Stent: A plastic hollow tube that is placed into the ureter, from the kidney to the bladder to prevent the ureter from swelling shut.  General instructions:  Despite the fact that no skin incisions were used, the area around the ureter and bladder is raw and irritated. The stent is a foreign body which will further irritate the bladder wall. This irritation is manifested by increased frequency of urination, both day and night, and by an increase in the urge to urinate. In some, the urge to urinate is present almost always. Sometimes the urge is strong enough that you may not be able to stop your self from urinating. The only real cure is to remove the stent and then give time for the bladder wall to heal which can't be done until the danger of the ureter swelling shut has passed. (This varies from 2-21 days).  You may see some blood in your urine while the stent is in place and a few days afterward. Do not be alarmed, even if the urine is clear for a while. Get off your feet and drink lots of fluids until clearing occurs. If you start to pass clots or don't improve, call us.  If you have a string coming from your urethra:  The stent string is attached to your ureteral stent.  Do not pull on thisIf you have a string coming from your urethra:  The stent string is attached to your ureteral stent.  Do not pull on this.  Diet:  You may return to your normal diet immediately. Because of the raw surface of your bladder, alcohol, spicy foods, foods high in acid and drinks with caffeine may cause irritation or frequency and should be used in moderation. To keep your urine flowing freely and avoid constipation, drink plenty of fluids during the day (8-10 glasses). Tip: Avoid cranberry juice because it is very acidic.  Activity:  Your physical activity doesn't need  to be restricted. However, if you are very active, you may see some blood in the urine. We suggest that you reduce your activity under the circumstances until the bleeding has stopped.  Bowels:  It is important to keep your bowels regular during the postoperative period. Straining with bowel movements can cause bleeding. A bowel movement every other day is reasonable. Use a mild laxative if needed, such as milk of magnesia 2-3 tablespoons, or 2 Dulcolax tablets. Call if you continue to have problems. If you had been taking narcotics for pain, before, during or after your surgery, you may be constipated. Take a laxative if necessary.     Medication:  You should resume your pre-surgery medications unless told not to. DO NOT RESUME YOUR ASPIRIN, or any other medicines like ibuprofen, motrin, excedrin, advil, aleve, vitamin E, fish oil as these can all cause bleeding x 7 days. In addition you may be given an antibiotic to prevent or treat infection. Antibiotics are not always necessary. All medication should be taken as prescribed until the bottles are finished unless you are having an unusual reaction to one of the drugs.  Problems you should report to [place]:  a. Fever greater than 101F. b. Heavy bleeding, or clots (see notes above about blood in urine). c. Inability to urinate. d. Drug reactions (hives, rash, nausea, vomiting, diarrhea). e. Severe burning or pain with urination that is not improving.  Followup:  You will need a followup appointment to monitor your progress in most cases. Please call the office for this appointment when you get home if your appointment has not already been scheduled. Usually the first appointment will be about 5-14 days after your surgery and if you have a stent in place it will likely be removed at that time. ° °

## 2014-12-07 NOTE — Anesthesia Postprocedure Evaluation (Signed)
  Anesthesia Post-op Note  Patient: [name]  Procedure(s) Performed: Procedure(s) (LRB): CYSTOSCOPY RIGHT URETEROSCOPY, RIGHT RETROGRADE, BIOPSY AND STENT PLACEMENT (Right)  Patient Location: PACU  Anesthesia Type: General  Level of Consciousness: awake and alert   Airway and Oxygen Therapy: Patient Spontanous Breathing  Post-op Pain: mild  Post-op Assessment: Post-op Vital signs reviewed, Patient's Cardiovascular Status Stable, Respiratory Function Stable, Patent Airway and No signs of Nausea or vomiting. Gave Demadex dose in PACU.  Last Vitals:  Filed Vitals:   12/07/14 0945  BP: 105/53  Pulse: 51  Temp: 36.6 C  Resp: 16    Post-op Vital Signs: stable   Complications: No apparent anesthesia complications

## 2014-12-07 NOTE — Op Note (Signed)
PATIENT:  [name]  PRE-OPERATIVE DIAGNOSIS: 1. 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 Retained stent  POST-OPERATIVE DIAGNOSIS: 1. Gross hematuria 2. 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 Cystoscopy with right retrograde pyelogram including interpretation. 2. Right double-J stent removal. 3. Right ureteroscopy with renal pelvic biopsy. 4. Right double-J stent replacement.  SURGEON:  Claybon Jabs  INDICATION: [name] is a 74 year old female who had gross hematuria. Her upper tract studies revealed what appeared to be a filling defect in the right renal pelvis and cystoscopically no bladder lesions but blood was noted effluxing from the right ureteral orifice. An attempt was made to evaluate this ureteroscopically but there was a relative narrowing/stricture of the right ureter that would not allow the ureteroscope passage into the kidney. Therefore a double-J stent was left in place and the patient returns for a repeat attempt at evaluation of the right renal pelvic filling defect.  ANESTHESIA:  General  EBL:  Minimal  DRAINS: 6 French, 24 cm double-J stent in the right ureter (no string)  LOCAL MEDICATIONS USED:  None  SPECIMEN:  1. Right renal pelvis cytology. 2. Biopsy of right upper pole infundibular mass/papillary tumor  Findings: I was able to pass the ureteral access sheath up the ureter easily into the area of the UPJ without any resistance indicating the indwelling stent had resulted in softening of the relative narrowing/stricturing of her right ureter.   A retrograde pyelogram revealed an obvious irregular filling defect in the upper pole infundibulum highly suspicious for transitional cell carcinoma. The tumor was visualized, photographed and I was able to obtain very good biopsy specimens.  Description of procedure: After informed consent the patient  was taken to the operating room and placed on the table in a supine position. General anesthesia was then administered. Once fully anesthetized the patient was moved to the dorsal lithotomy position and the genitalia were sterilely prepped and draped in standard fashion. An official timeout was then performed.  The 23 French rigid cystoscope was passed under direct vision into the bladder. A stent was noted exiting the right ureteral orifice was some mild edema surrounding this but there was no evidence of ladder tumors, stones or other worrisome lesions noted on full and systematic inspection of the bladder. The alligator forceps were then used to grasp the distal aspect of the right ureteral stent and this was withdrawn through the urethral meatus. I then advanced a 0.038 inch sensor guidewire through the stent and up into the area the renal pelvis under fluoroscopy. The stent was removed and over the guidewire I passed a 6 [place] open-ended ureteral catheter to the area of the ureteropelvic junction and then remove the guidewire in preparation for right retrograde pyelogram.  Right retrograde pyelography was performed by injecting full-strength Omnipaque contrast through the open-ended catheter into the right renal pelvis with the pelvis actually appearing smooth and without filling defect however there was a large filling defect noted in the upper pole infundibulum. The ureter had been previously evaluated both ureteroscopically and by retrograde pyelography and was not imaged at this time.  I reinserted the guidewire through the open-ended catheter and removed the open-ended catheter. I then passed the inner portion of a ureteral access sheath up the right ureter over the guidewire and this passed quite easily into the area of the UPJ. I therefore removed this and assembled the access sheath and then  passed this over the guidewire again easily negotiating the full length of the ureter to the UPJ region  where the inner portion of the access sheath and guidewire were removed and the access sheath was secured to the drape.  The digital, flexible ureteroscope was then passed through the access sheath into the area of the renal pelvis which was visualized and noted to be free of any lesions. I then did a systematic search of each of the calyces starting at the lower pole and progressing to the upper pole. No abnormality was noted until I entered the upper pole where an obvious papillary tumor was identified and photographed. I advanced the scope beyond this into the area of the infundibulum and at this location I performed barbotage with saline and this was obtained and sent for cytology and labeled cytology from right renal pelvis. I then chose a 0 tip nitinol basket and passed this through the ureteroscope and was able to grasp portions of the tumor and was able to extract these. Several good biopsies were obtained. There was a small amount of bleeding but it was not significant and the tumor size was such that an attempt at laser fulguration of the lesion would not be feasible. I therefore elected to replace the stent.  A guidewire was passed through the ureteroscope and in the area the renal pelvis and left in place while the ureteroscope and access sheath were removed. I then backloaded the cystoscope over the guidewire and passed the double-J stent over the guidewire into the area the renal pelvis. As I removed the guidewire there was good curl noted in the area the renal pelvis and within the bladder when the guidewire was completely removed. The bladder was then drained, the cystoscope was removed and the patient was awakened and taken to recovery room in stable and satisfactory condition. She tolerated the procedure well with no intraoperative complications.    PLAN OF CARE: Discharge to home after PACU  PATIENT DISPOSITION:  PACU - hemodynamically stable.

## 2014-12-07 NOTE — Progress Notes (Signed)
Dr. Delma Post in to see patient- made aware of patient's complaints of feeling SOB- respirations regular and unlabored- Nasal oxygen going at 2 liter per minute- orders given

## 2014-12-07 NOTE — Telephone Encounter (Signed)
New Message  Pt daughter calling to speak w/ Device- wants to know why pt has appt for 11/16. Please call back and discuss.

## 2014-12-07 NOTE — Anesthesia Procedure Notes (Signed)
Procedure Name: Intubation Date/Time: 12/07/2014 7:16 AM Performed by: Nathanyl Andujo, Virgel Gess Pre-anesthesia Checklist: Patient identified, Emergency Drugs available, Suction available, Patient being monitored and Timeout performed Patient Re-evaluated:Patient Re-evaluated prior to inductionOxygen Delivery Method: Circle system utilized Preoxygenation: Pre-oxygenation with 100% oxygen Intubation Type: IV induction Ventilation: Mask ventilation without difficulty Laryngoscope Size: Mac and 3 Grade View: Grade II Tube type: Oral Tube size: 7.5 mm Number of attempts: 1 Airway Equipment and Method: Stylet Placement Confirmation: ETT inserted through vocal cords under direct vision,  positive ETCO2,  CO2 detector and breath sounds checked- equal and bilateral Secured at: 21 cm Tube secured with: Tape Dental Injury: Teeth and Oropharynx as per pre-operative assessment

## 2014-12-12 ENCOUNTER — Encounter: Payer: Self-pay | Admitting: Internal Medicine

## 2014-12-12 ENCOUNTER — Ambulatory Visit (INDEPENDENT_AMBULATORY_CARE_PROVIDER_SITE_OTHER): Payer: Medicare HMO | Admitting: Internal Medicine

## 2014-12-12 VITALS — BP 122/64 | HR 73 | Ht 62.0 in | Wt 230.0 lb

## 2014-12-12 DIAGNOSIS — I10 Essential (primary) hypertension: Secondary | ICD-10-CM

## 2014-12-12 DIAGNOSIS — I5032 Chronic diastolic (congestive) heart failure: Secondary | ICD-10-CM

## 2014-12-12 DIAGNOSIS — I480 Paroxysmal atrial fibrillation: Secondary | ICD-10-CM | POA: Diagnosis not present

## 2014-12-12 LAB — CUP PACEART INCLINIC DEVICE CHECK: Date Time Interrogation Session: 20161116140314

## 2014-12-12 MED ORDER — AMIODARONE HCL 200 MG PO TABS: 100.0000 mg | ORAL_TABLET | Freq: Every day | ORAL | Status: DC

## 2014-12-12 NOTE — Patient Instructions (Signed)
Medication Instructions:  Your physician has recommended you make the following change in your medication:  1) Decrease Amiodarone to 100mg  daily   Labwork: None ordered   Testing/Procedures: None ordered   Follow-Up: Your physician recommends that you schedule a follow-up appointment in: 2 months with Roderic Palau, NP   Any Other Special Instructions Will Be Listed Below (If Applicable).     If you need a refill on your cardiac medications before your next appointment, please call your pharmacy.
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
## 2014-12-17 ENCOUNTER — Ambulatory Visit (INDEPENDENT_AMBULATORY_CARE_PROVIDER_SITE_OTHER): Payer: Medicare HMO | Admitting: *Deleted

## 2014-12-17 DIAGNOSIS — I480 Paroxysmal atrial fibrillation: Secondary | ICD-10-CM | POA: Diagnosis not present

## 2014-12-18 NOTE — Progress Notes (Signed)
LOOP RECORDER  

## 2014-12-24 ENCOUNTER — Encounter (HOSPITAL_COMMUNITY): Payer: Self-pay

## 2014-12-31 ENCOUNTER — Ambulatory Visit (INDEPENDENT_AMBULATORY_CARE_PROVIDER_SITE_OTHER): Payer: Medicare HMO | Admitting: Cardiology

## 2014-12-31 ENCOUNTER — Encounter: Payer: Self-pay | Admitting: Cardiology

## 2014-12-31 VITALS — BP 114/80 | HR 74 | Ht 62.0 in | Wt 223.2 lb

## 2014-12-31 DIAGNOSIS — I10 Essential (primary) hypertension: Secondary | ICD-10-CM

## 2014-12-31 DIAGNOSIS — I484 Atypical atrial flutter: Secondary | ICD-10-CM

## 2014-12-31 DIAGNOSIS — I481 Persistent atrial fibrillation: Secondary | ICD-10-CM

## 2014-12-31 DIAGNOSIS — I4819 Other persistent atrial fibrillation: Secondary | ICD-10-CM

## 2014-12-31 DIAGNOSIS — I5032 Chronic diastolic (congestive) heart failure: Secondary | ICD-10-CM

## 2014-12-31 DIAGNOSIS — I2510 Atherosclerotic heart disease of native coronary artery without angina pectoris: Secondary | ICD-10-CM

## 2014-12-31 DIAGNOSIS — I4892 Unspecified atrial flutter: Secondary | ICD-10-CM | POA: Insufficient documentation

## 2014-12-31 NOTE — Patient Instructions (Signed)
Continue your current therapy  You are clear for surgery from a cardiac standpoint. You will need to stop Xarelto 48 hours prior to surgery.  I will see you in 3 months.
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
## 2015-01-01 ENCOUNTER — Other Ambulatory Visit: Payer: Self-pay | Admitting: Urology

## 2015-01-14 ENCOUNTER — Ambulatory Visit (INDEPENDENT_AMBULATORY_CARE_PROVIDER_SITE_OTHER): Payer: Medicare HMO | Admitting: *Deleted

## 2015-01-14 DIAGNOSIS — I4819 Other persistent atrial fibrillation: Secondary | ICD-10-CM

## 2015-01-14 DIAGNOSIS — I481 Persistent atrial fibrillation: Secondary | ICD-10-CM

## 2015-01-16 ENCOUNTER — Encounter: Payer: Medicare HMO | Admitting: *Deleted

## 2015-01-16 NOTE — Progress Notes (Signed)
Carelink Summary Report / Loop Recorder 

## 2015-01-25 LAB — CUP PACEART REMOTE DEVICE CHECK
MDC IDC SESS DTM: 20161230120800
MDC IDC SESS DTM: 20161230121100

## 2015-02-01 ENCOUNTER — Telehealth: Payer: Self-pay

## 2015-02-01 NOTE — Telephone Encounter (Signed)
Received medical necessity form for O2 from [facility].Form completed and faxed back to fax # [phone].

## 2015-02-13 ENCOUNTER — Ambulatory Visit (INDEPENDENT_AMBULATORY_CARE_PROVIDER_SITE_OTHER): Payer: Medicare HMO | Admitting: *Deleted

## 2015-02-13 ENCOUNTER — Inpatient Hospital Stay (HOSPITAL_COMMUNITY): Admission: RE | Admit: 2015-02-13 | Payer: Medicare HMO | Source: Ambulatory Visit | Admitting: Nurse Practitioner

## 2015-02-13 DIAGNOSIS — I480 Paroxysmal atrial fibrillation: Secondary | ICD-10-CM | POA: Diagnosis not present

## 2015-02-13 NOTE — Progress Notes (Signed)
Carelink Summary Report / Loop Recorder 

## 2015-02-14 ENCOUNTER — Ambulatory Visit (HOSPITAL_COMMUNITY)
Admission: RE | Admit: 2015-02-14 | Discharge: 2015-02-14 | Disposition: A | Discharge status: Home / Self Care | Payer: Medicare HMO | Source: Ambulatory Visit | Attending: Anesthesiology | Admitting: Anesthesiology

## 2015-02-14 ENCOUNTER — Encounter (HOSPITAL_COMMUNITY)
Admission: RE | Admit: 2015-02-14 | Discharge: 2015-02-14 | Disposition: A | Discharge status: Home / Self Care | Payer: Medicare HMO | Source: Ambulatory Visit | Attending: Urology | Admitting: Urology

## 2015-02-14 ENCOUNTER — Encounter (HOSPITAL_COMMUNITY): Payer: Self-pay

## 2015-02-14 DIAGNOSIS — R05 Cough: Secondary | ICD-10-CM | POA: Insufficient documentation

## 2015-02-14 DIAGNOSIS — R079 Chest pain, unspecified: Secondary | ICD-10-CM | POA: Insufficient documentation

## 2015-02-14 DIAGNOSIS — R002 Palpitations: Secondary | ICD-10-CM | POA: Insufficient documentation

## 2015-02-14 DIAGNOSIS — R0602 Shortness of breath: Secondary | ICD-10-CM | POA: Insufficient documentation

## 2015-02-14 DIAGNOSIS — R059 Cough, unspecified: Secondary | ICD-10-CM

## 2015-02-14 HISTORY — DX: Acute myocardial infarction, unspecified: I21.9

## 2015-02-14 HISTORY — DX: Family history of other specified conditions: Z84.89

## 2015-02-14 HISTORY — DX: Sleep apnea, unspecified: G47.30

## 2015-02-14 LAB — BASIC METABOLIC PANEL
ANION GAP: 16 — AB (ref 5–15)
BUN: 20 mg/dL (ref 6–20)
CALCIUM: 8.7 mg/dL — AB (ref 8.9–10.3)
CO2: 31 mmol/L (ref 22–32)
Chloride: 91 mmol/L — ABNORMAL LOW (ref 101–111)
Creatinine, Ser: 1.52 mg/dL — ABNORMAL HIGH (ref 0.44–1.00)
GFR calc Af Amer: 38 mL/min — ABNORMAL LOW (ref >60)
GFR calc non Af Amer: 33 mL/min — ABNORMAL LOW (ref >60)
GLUCOSE: 160 mg/dL — AB (ref 65–99)
Potassium: 4.2 mmol/L (ref 3.5–5.1)
Sodium: 138 mmol/L (ref 135–145)

## 2015-02-14 LAB — CBC
HCT: 33.8 % — ABNORMAL LOW (ref 36.0–46.0)
HEMOGLOBIN: 9 g/dL — AB (ref 12.0–15.0)
MCH: 20.7 pg — AB (ref 26.0–34.0)
MCHC: 26.6 g/dL — AB (ref 30.0–36.0)
MCV: 77.9 fL — ABNORMAL LOW (ref 78.0–100.0)
Platelets: 285 10*3/uL (ref 150–400)
RBC: 4.34 MIL/uL (ref 3.87–5.11)
RDW: 19.8 % — ABNORMAL HIGH (ref 11.5–15.5)
WBC: 8.5 10*3/uL (ref 4.0–10.5)

## 2015-02-14 LAB — ABO/RH: ABO/RH(D): A POS

## 2015-02-14 NOTE — Progress Notes (Signed)
[name] per lab stated RBC crossmatch order to be released morning of surgery.

## 2015-02-14 NOTE — Progress Notes (Signed)
Spoke with Dr Imogene Burn with anethesia in regards to pts H&P. Anesthesia requested for pt to see heart failure clinic again prior to surgery date for further clearance since new issues have arisen. Pt and her daughter is aware.

## 2015-02-14 NOTE — Patient Instructions (Addendum)
[name]  02/14/2015   Your procedure is scheduled on: Wednesday February 20, 2015   Report to [name] Main  Entrance take [place]  elevators to 3rd floor to  [place] at 6:30 AM.  Call this number if you have problems the morning of surgery [phone]   Remember: ONLY 1 PERSON MAY GO WITH YOU TO SHORT STAY TO GET  READY MORNING OF [place].  Do not eat food or drink liquids :After Midnight.     Take these medicines the morning of surgery with A SIP OF WATER: Amiodarone; Cetirizine (Zyrtec); Clonazepam (Klonopin); Diltiazem (Cardizem); Synthroid (bring your own medication with this due to not being able to take generic; Metoprolol;  TAKE 1/2 EVENING DOSE OF INSULIN NIGHT PRIOR TO SURGERY; Lansoprazole (Prevacid)  DO NOT TAKE ANY DIABETIC MEDICATIONS DAY OF YOUR SURGERY                               You may not have any metal on your body including hair pins and              piercings  Do not wear jewelry, make-up, lotions, powders or perfumes, deodorant             Do not wear nail polish.  Do not shave  48 hours prior to surgery.              Do not bring valuables to the hospital. [place].  Contacts, dentures or bridgework may not be worn into surgery.  Leave suitcase in the car. After surgery it may be brought to your room.    Special Instructions: FOLLOW SURGEON'S INSTRUCTION IN REGARDS TO BOWEL PREPARATION PRIOR TO SURGICAL PROCEDURE DATE               Please read over the following fact sheets you were given:BLOOD TRANSFUSION INFORMATION SHEET _____________________________________________________________________             [facility] - Preparing for Surgery Before surgery, you can play an important role.  Because skin is not sterile, your skin needs to be as free of germs as possible.  You can reduce the number of germs on your skin by washing with CHG (chlorahexidine gluconate)  soap before surgery.  CHG is an antiseptic cleaner which kills germs and bonds with the skin to continue killing germs even after washing. Please DO NOT use if you have an allergy to CHG or antibacterial soaps.  If your skin becomes reddened/irritated stop using the CHG and inform your nurse when you arrive at Short Stay. Do not shave (including legs and underarms) for at least 48 hours prior to the first CHG shower.  You may shave your face/neck. Please follow these instructions carefully:  1.  Shower with CHG Soap the night before surgery and the  morning of Surgery.  2.  If you choose to wash your hair, wash your hair first as usual with your  normal  shampoo.  3.  After you shampoo, rinse your hair and body thoroughly to remove the  shampoo.  4.  Use CHG as you would any other liquid soap.  You can apply chg directly  to the skin and wash                       Gently with a scrungie or clean washcloth.  5.  Apply the CHG Soap to your body ONLY FROM THE NECK DOWN.   Do not use on face/ open                           Wound or open sores. Avoid contact with eyes, ears mouth and genitals (private parts).                       Wash face,  Genitals (private parts) with your normal soap.             6.  Wash thoroughly, paying special attention to the area where your surgery  will be performed.  7.  Thoroughly rinse your body with warm water from the neck down.  8.  DO NOT shower/wash with your normal soap after using and rinsing off  the CHG Soap.                9.  Pat yourself dry with a clean towel.            10.  Wear clean pajamas.            11.  Place clean sheets on your bed the night of your first shower and do not  sleep with pets. Day of Surgery : Do not apply any lotions/deodorants the morning of surgery.  Please wear clean clothes to the hospital/surgery center.  FAILURE TO FOLLOW THESE INSTRUCTIONS MAY RESULT IN THE CANCELLATION OF YOUR SURGERY PATIENT  SIGNATURE_________________________________  NURSE SIGNATURE__________________________________  ________________________________________________________________________  WHAT IS A BLOOD TRANSFUSION? Blood Transfusion Information  A transfusion is the replacement of blood or some of its parts. Blood is made up of multiple cells which provide different functions.  Red blood cells carry oxygen and are used for blood loss replacement.  White blood cells fight against infection.  Platelets control bleeding.  Plasma helps clot blood.  Other blood products are available for specialized needs, such as hemophilia or other clotting disorders. BEFORE THE TRANSFUSION  Who gives blood for transfusions?   Healthy volunteers who are fully evaluated to make sure their blood is safe. This is blood bank blood. Transfusion therapy is the safest it has ever been in the practice of medicine. Before blood is taken from a donor, a complete history is taken to make sure that person has no history of diseases nor engages in risky social behavior (examples are intravenous drug use or sexual activity with multiple partners). The donor's travel history is screened to minimize risk of transmitting infections, such as malaria. The donated blood is tested for signs of infectious diseases, such as HIV and hepatitis. The blood is then tested to be sure it is compatible with you in order to minimize the chance of a transfusion reaction. If you or a relative donates blood, this is often done in anticipation of surgery and is not appropriate for emergency situations. It takes many days to process the donated blood. RISKS AND COMPLICATIONS Although transfusion therapy is very safe and saves many lives, the main dangers of transfusion include:   Getting an infectious disease.  Developing a transfusion reaction. This  is an allergic reaction to something in the blood you were given. Every precaution is taken to prevent  this. The decision to have a blood transfusion has been considered carefully by your caregiver before blood is given. Blood is not given unless the benefits outweigh the risks. AFTER THE TRANSFUSION  Right after receiving a blood transfusion, you will usually feel much better and more energetic. This is especially true if your red blood cells have gotten low (anemic). The transfusion raises the level of the red blood cells which carry oxygen, and this usually causes an energy increase.  The nurse administering the transfusion will monitor you carefully for complications. HOME CARE INSTRUCTIONS  No special instructions are needed after a transfusion. You may find your energy is better. Speak with your caregiver about any limitations on activity for underlying diseases you may have. SEEK MEDICAL CARE IF:   Your condition is not improving after your transfusion.  You develop redness or irritation at the intravenous (IV) site. SEEK IMMEDIATE MEDICAL CARE IF:  Any of the following symptoms occur over the next 12 hours:  Shaking chills.  You have a temperature by mouth above 102 F (38.9 C), not controlled by medicine.  Chest, back, or muscle pain.  People around you feel you are not acting correctly or are confused.  Shortness of breath or difficulty breathing.  Dizziness and fainting.  You get a rash or develop hives.  You have a decrease in urine output.  Your urine turns a dark color or changes to pink, red, or brown. Any of the following symptoms occur over the next 10 days:  You have a temperature by mouth above 102 F (38.9 C), not controlled by medicine.  Shortness of breath.  Weakness after normal activity.  The white part of the eye turns yellow (jaundice).  You have a decrease in the amount of urine or are urinating less often.  Your urine turns a dark color or changes to pink, red, or brown. Document Released: 01/10/2000 Document Revised: 04/06/2011 Document  Reviewed: 08/29/2007 [facility] Patient Information 2014 [place], [place].  _______________________________________________________________________

## 2015-02-15 ENCOUNTER — Telehealth: Payer: Self-pay | Admitting: Cardiology

## 2015-02-15 ENCOUNTER — Encounter (HOSPITAL_COMMUNITY): Payer: Self-pay

## 2015-02-15 NOTE — Telephone Encounter (Signed)
Left message for return call with fax number. Dr Doug Sou last office note contains clearance and xarelto directions.

## 2015-02-15 NOTE — Progress Notes (Addendum)
Spoke with Jari Sportsman with [facility] / Dr Imogene Burn / anesthesia requesting new cardiac clearance.

## 2015-02-15 NOTE — Progress Notes (Signed)
[facility] Endocrinology H&P per chart 02/06/2015  A1C results per chart 01/31/2015  H&P with cardiac clearance per chart per Dr [name] 12/31/2014 This nurse spoke with Roderic Palau NP on 02/14/2015 in regards to needing new cardiac clearance; direct to contact Dr Rayann Heman cardiologist.

## 2015-02-15 NOTE — Progress Notes (Signed)
Your patient has screened at an elevated risk for Obstructive Sleep Apnea using the Stop-Bang Tool during a pre-surgical visit. Patient scored at high risk.  

## 2015-02-15 NOTE — Progress Notes (Signed)
CBC and BMP results in epic per PAT visit 02/14/2015 sent to Dr Tresa Moore

## 2015-02-15 NOTE — Telephone Encounter (Signed)
New messge      Pre-Admit is calling from [facility] asking for a new cardiac clearance note - saying patient is clear for MD stand point.     Request for surgical clearance:  1. What type of surgery is being performed?  Nethroureterectomy   2. When is this surgery scheduled? 1.25.2017   3. Are there any medications that need to be held prior to surgery and how long? Pt aware of xarelto   4. Name of physician performing surgery? Dr. Collene Mares   5. What is your office phone and fax number? [phone]

## 2015-02-18 ENCOUNTER — Ambulatory Visit (INDEPENDENT_AMBULATORY_CARE_PROVIDER_SITE_OTHER): Payer: Medicare HMO | Admitting: Physician Assistant

## 2015-02-18 ENCOUNTER — Telehealth (HOSPITAL_COMMUNITY): Payer: Self-pay | Admitting: *Deleted

## 2015-02-18 ENCOUNTER — Encounter: Payer: Self-pay | Admitting: Physician Assistant

## 2015-02-18 VITALS — BP 118/72 | HR 68 | Ht 62.0 in | Wt 225.8 lb

## 2015-02-18 DIAGNOSIS — I5032 Chronic diastolic (congestive) heart failure: Secondary | ICD-10-CM

## 2015-02-18 DIAGNOSIS — I5033 Acute on chronic diastolic (congestive) heart failure: Secondary | ICD-10-CM

## 2015-02-18 DIAGNOSIS — I480 Paroxysmal atrial fibrillation: Secondary | ICD-10-CM

## 2015-02-18 DIAGNOSIS — I10 Essential (primary) hypertension: Secondary | ICD-10-CM | POA: Diagnosis not present

## 2015-02-18 DIAGNOSIS — I2510 Atherosclerotic heart disease of native coronary artery without angina pectoris: Secondary | ICD-10-CM | POA: Diagnosis not present

## 2015-02-18 DIAGNOSIS — J4521 Mild intermittent asthma with (acute) exacerbation: Secondary | ICD-10-CM

## 2015-02-18 NOTE — Patient Instructions (Addendum)
Medication Instructions:  Increase torsemide to 3 of a 20mg  tablet two times a day for the next 3-4 days.  Labwork: None  Testing/Procedures: None   Follow-Up: Your physician recommends that you schedule a follow-up appointment in: March with Dr [name].          If you need a refill on your cardiac medications before your next appointment, please call your pharmacy.

## 2015-02-18 NOTE — Assessment & Plan Note (Addendum)
Patient definitely has some fluid overload. Will increase torsemide to 60 mg twice a day for the next 3-4 days.

## 2015-02-18 NOTE — Assessment & Plan Note (Signed)
Increasing diuretics for increased edema.

## 2015-02-18 NOTE — Assessment & Plan Note (Signed)
Patient is wheezing and is on oxygen. We will give her diuretics to help with her edema but I doubt that this will help with her wheezing. She's had recent pulmonary infection treated with Antibiotics and is still getting over it. She says she can't take inhalers because it causes tachycardia. Recommend follow-up with Dr. Melina Copa. She is scheduled for surgery Wednesday and will need to have her breathing cleared before she undergoes anesthesia
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
## 2015-02-18 NOTE — Assessment & Plan Note (Signed)
In normal sinus rhythm. Continue amiodarone, diltiazem and metoprolol. Xarelto on hold because of impending surgery

## 2015-02-18 NOTE — Assessment & Plan Note (Signed)
Stable without chest pain 

## 2015-02-18 NOTE — Assessment & Plan Note (Signed)
BP stable.

## 2015-02-18 NOTE — Telephone Encounter (Signed)
Patient daughter called in stating her mother is not doing well and is supposed to be having surgery on Wednesday. Pt weight is up several pounds, increased lower extremity edema, shortness of breath seems increased.  Very anxious and feels patient needs to be seen today. Appt made with [facility] at [address] office. Daughter agreeable to this.

## 2015-02-18 NOTE — Progress Notes (Signed)
Note per Ermalinda Barrios PA with cardiology per chart with note highlighted  This nurse did leave VM message with [facility] per [facility] with Dr Tresa Moore in regards to situation and recommendation

## 2015-02-19 MED ORDER — GENTAMICIN SULFATE 40 MG/ML IJ SOLN
5.0000 mg/kg | INTRAVENOUS | Status: AC
  Administered 2015-02-20: 360 mg via INTRAVENOUS
  Filled 2015-02-19: qty 9

## 2015-02-19 NOTE — Telephone Encounter (Signed)
Left message to please call with fax number.

## 2015-02-19 NOTE — Telephone Encounter (Signed)
Will fax this note to [phone].

## 2015-02-20 ENCOUNTER — Inpatient Hospital Stay (HOSPITAL_COMMUNITY): Payer: Medicare HMO | Admitting: Anesthesiology

## 2015-02-20 ENCOUNTER — Encounter (HOSPITAL_COMMUNITY)
Admission: RE | Disposition: A | Discharge status: Home Health | Payer: Self-pay | Source: Ambulatory Visit | Attending: Urology

## 2015-02-20 ENCOUNTER — Encounter (HOSPITAL_COMMUNITY): Payer: Self-pay | Admitting: Anesthesiology

## 2015-02-20 ENCOUNTER — Inpatient Hospital Stay (HOSPITAL_COMMUNITY)
Admission: RE | Admit: 2015-02-20 | Discharge: 2015-02-24 | DRG: 657 | Disposition: A | Discharge status: Home Health | Payer: Medicare HMO | Source: Ambulatory Visit | Attending: Urology | Admitting: Urology

## 2015-02-20 DIAGNOSIS — R001 Bradycardia, unspecified: Secondary | ICD-10-CM | POA: Diagnosis present

## 2015-02-20 DIAGNOSIS — I2510 Atherosclerotic heart disease of native coronary artery without angina pectoris: Secondary | ICD-10-CM | POA: Diagnosis present

## 2015-02-20 DIAGNOSIS — K589 Irritable bowel syndrome without diarrhea: Secondary | ICD-10-CM | POA: Diagnosis present

## 2015-02-20 DIAGNOSIS — K3184 Gastroparesis: Secondary | ICD-10-CM | POA: Diagnosis present

## 2015-02-20 DIAGNOSIS — Z833 Family history of diabetes mellitus: Secondary | ICD-10-CM | POA: Diagnosis not present

## 2015-02-20 DIAGNOSIS — Z8041 Family history of malignant neoplasm of ovary: Secondary | ICD-10-CM

## 2015-02-20 DIAGNOSIS — N289 Disorder of kidney and ureter, unspecified: Secondary | ICD-10-CM | POA: Diagnosis present

## 2015-02-20 DIAGNOSIS — Z6841 Body Mass Index (BMI) 40.0 and over, adult: Secondary | ICD-10-CM

## 2015-02-20 DIAGNOSIS — I959 Hypotension, unspecified: Secondary | ICD-10-CM | POA: Diagnosis not present

## 2015-02-20 DIAGNOSIS — I5032 Chronic diastolic (congestive) heart failure: Secondary | ICD-10-CM | POA: Diagnosis present

## 2015-02-20 DIAGNOSIS — J449 Chronic obstructive pulmonary disease, unspecified: Secondary | ICD-10-CM | POA: Diagnosis present

## 2015-02-20 DIAGNOSIS — C651 Malignant neoplasm of right renal pelvis: Secondary | ICD-10-CM | POA: Diagnosis present

## 2015-02-20 DIAGNOSIS — Z803 Family history of malignant neoplasm of breast: Secondary | ICD-10-CM

## 2015-02-20 DIAGNOSIS — Z9981 Dependence on supplemental oxygen: Secondary | ICD-10-CM

## 2015-02-20 DIAGNOSIS — Z79899 Other long term (current) drug therapy: Secondary | ICD-10-CM

## 2015-02-20 DIAGNOSIS — I110 Hypertensive heart disease with heart failure: Secondary | ICD-10-CM | POA: Diagnosis present

## 2015-02-20 DIAGNOSIS — E1143 Type 2 diabetes mellitus with diabetic autonomic (poly)neuropathy: Secondary | ICD-10-CM | POA: Diagnosis present

## 2015-02-20 DIAGNOSIS — K219 Gastro-esophageal reflux disease without esophagitis: Secondary | ICD-10-CM | POA: Diagnosis present

## 2015-02-20 DIAGNOSIS — I480 Paroxysmal atrial fibrillation: Secondary | ICD-10-CM | POA: Diagnosis present

## 2015-02-20 DIAGNOSIS — Z9049 Acquired absence of other specified parts of digestive tract: Secondary | ICD-10-CM

## 2015-02-20 DIAGNOSIS — D62 Acute posthemorrhagic anemia: Secondary | ICD-10-CM | POA: Diagnosis not present

## 2015-02-20 DIAGNOSIS — I429 Cardiomyopathy, unspecified: Secondary | ICD-10-CM | POA: Diagnosis present

## 2015-02-20 DIAGNOSIS — E039 Hypothyroidism, unspecified: Secondary | ICD-10-CM | POA: Diagnosis present

## 2015-02-20 DIAGNOSIS — K449 Diaphragmatic hernia without obstruction or gangrene: Secondary | ICD-10-CM | POA: Diagnosis present

## 2015-02-20 DIAGNOSIS — N2889 Other specified disorders of kidney and ureter: Secondary | ICD-10-CM | POA: Diagnosis present

## 2015-02-20 DIAGNOSIS — E785 Hyperlipidemia, unspecified: Secondary | ICD-10-CM | POA: Diagnosis present

## 2015-02-20 DIAGNOSIS — Z90710 Acquired absence of both cervix and uterus: Secondary | ICD-10-CM | POA: Diagnosis not present

## 2015-02-20 DIAGNOSIS — Z800 Family history of malignant neoplasm of digestive organs: Secondary | ICD-10-CM | POA: Diagnosis not present

## 2015-02-20 DIAGNOSIS — I252 Old myocardial infarction: Secondary | ICD-10-CM | POA: Diagnosis not present

## 2015-02-20 DIAGNOSIS — Z794 Long term (current) use of insulin: Secondary | ICD-10-CM | POA: Diagnosis not present

## 2015-02-20 HISTORY — PX: ROBOT ASSITED LAPAROSCOPIC NEPHROURETERECTOMY: SHX6077

## 2015-02-20 LAB — MRSA PCR SCREENING: MRSA by PCR: NEGATIVE

## 2015-02-20 LAB — HEMOGLOBIN AND HEMATOCRIT, BLOOD
HCT: 30.7 % — ABNORMAL LOW (ref 36.0–46.0)
HCT: 27.4 % — ABNORMAL LOW (ref 36.0–46.0)
Hemoglobin: 8.3 g/dL — ABNORMAL LOW (ref 12.0–15.0)
Hemoglobin: 7.7 g/dL — ABNORMAL LOW (ref 12.0–15.0)

## 2015-02-20 LAB — GLUCOSE, CAPILLARY
GLUCOSE-CAPILLARY: 215 mg/dL — AB (ref 65–99)
GLUCOSE-CAPILLARY: 235 mg/dL — AB (ref 65–99)
Glucose-Capillary: 91 mg/dL (ref 65–99)
Glucose-Capillary: 227 mg/dL — ABNORMAL HIGH (ref 65–99)

## 2015-02-20 LAB — PROTIME-INR
INR: 1.22 (ref 0.00–1.49)
PROTHROMBIN TIME: 15.1 s (ref 11.6–15.2)

## 2015-02-20 LAB — PREPARE RBC (CROSSMATCH)

## 2015-02-20 SURGERY — ROBOT ASSITED LAPAROSCOPIC NEPHROURETERECTOMY
Anesthesia: General | Site: Abdomen | Laterality: Right

## 2015-02-20 MED ORDER — SODIUM CHLORIDE 0.9 % IJ SOLN
INTRAMUSCULAR | Status: AC
  Filled 2015-02-20: qty 10

## 2015-02-20 MED ORDER — HYDROMORPHONE HCL 2 MG PO TABS
2.0000 mg | ORAL_TABLET | ORAL | Status: DC | PRN
Start: 2015-02-20 — End: 2015-02-24
  Filled 2015-02-20: qty 1

## 2015-02-20 MED ORDER — METFORMIN HCL ER 500 MG PO TB24
500.0000 mg | ORAL_TABLET | Freq: Two times a day (BID) | ORAL | Status: DC
  Administered 2015-02-21: 500 mg via ORAL
  Filled 2015-02-20 (×3): qty 1

## 2015-02-20 MED ORDER — DIPHENHYDRAMINE HCL 12.5 MG/5ML PO ELIX: 12.5000 mg | ORAL_SOLUTION | Freq: Four times a day (QID) | ORAL | Status: DC | PRN

## 2015-02-20 MED ORDER — DEXAMETHASONE SODIUM PHOSPHATE 10 MG/ML IJ SOLN
INTRAMUSCULAR | Status: DC | PRN
  Administered 2015-02-20: 10 mg via INTRAVENOUS

## 2015-02-20 MED ORDER — LACTATED RINGERS IV SOLN
INTRAVENOUS | Status: DC | PRN
  Administered 2015-02-20: 08:00:00 via INTRAVENOUS

## 2015-02-20 MED ORDER — SODIUM CHLORIDE 0.45 % IV SOLN
INTRAVENOUS | Status: DC
  Administered 2015-02-20: 15:00:00 via INTRAVENOUS
  Administered 2015-02-21: 100 mL/h via INTRAVENOUS

## 2015-02-20 MED ORDER — LIDOCAINE HCL (CARDIAC) 20 MG/ML IV SOLN
INTRAVENOUS | Status: AC
  Filled 2015-02-20: qty 5

## 2015-02-20 MED ORDER — DEXAMETHASONE SODIUM PHOSPHATE 10 MG/ML IJ SOLN
INTRAMUSCULAR | Status: AC
  Filled 2015-02-20: qty 1

## 2015-02-20 MED ORDER — AMIODARONE HCL 100 MG PO TABS
100.0000 mg | ORAL_TABLET | Freq: Every day | ORAL | Status: DC
  Administered 2015-02-21 – 2015-02-24 (×3): 100 mg via ORAL
  Filled 2015-02-20 (×3): qty 1

## 2015-02-20 MED ORDER — BUPIVACAINE LIPOSOME 1.3 % IJ SUSP
20.0000 mL | Freq: Once | INTRAMUSCULAR | Status: AC
  Administered 2015-02-20: 20 mL
  Filled 2015-02-20: qty 20

## 2015-02-20 MED ORDER — POTASSIUM CHLORIDE ER 20 MEQ PO TBCR: 20.0000 meq | EXTENDED_RELEASE_TABLET | Freq: Two times a day (BID) | ORAL | Status: DC

## 2015-02-20 MED ORDER — PHENYLEPHRINE HCL 10 MG/ML IJ SOLN
INTRAMUSCULAR | Status: AC
  Filled 2015-02-20: qty 2

## 2015-02-20 MED ORDER — SODIUM CHLORIDE 0.9 % IV SOLN: Freq: Once | INTRAVENOUS | Status: DC

## 2015-02-20 MED ORDER — TORSEMIDE 20 MG PO TABS
40.0000 mg | ORAL_TABLET | Freq: Every day | ORAL | Status: DC
Start: 2015-02-20 — End: 2015-02-24
  Administered 2015-02-20 – 2015-02-21 (×2): 40 mg via ORAL
  Filled 2015-02-20 (×5): qty 2

## 2015-02-20 MED ORDER — PROPOFOL 10 MG/ML IV BOLUS
INTRAVENOUS | Status: DC | PRN
  Administered 2015-02-20: 120 mg via INTRAVENOUS

## 2015-02-20 MED ORDER — LACTATED RINGERS IV SOLN
INTRAVENOUS | Status: DC | PRN
  Administered 2015-02-20 (×2): via INTRAVENOUS

## 2015-02-20 MED ORDER — EPHEDRINE SULFATE 50 MG/ML IJ SOLN
INTRAMUSCULAR | Status: DC | PRN
  Administered 2015-02-20 (×2): 10 mg via INTRAVENOUS

## 2015-02-20 MED ORDER — ROCURONIUM BROMIDE 100 MG/10ML IV SOLN
INTRAVENOUS | Status: AC
  Filled 2015-02-20: qty 1

## 2015-02-20 MED ORDER — HYDROMORPHONE HCL 1 MG/ML IJ SOLN: 0.2500 mg | INTRAMUSCULAR | Status: DC | PRN

## 2015-02-20 MED ORDER — ROCURONIUM BROMIDE 100 MG/10ML IV SOLN
INTRAVENOUS | Status: DC | PRN
  Administered 2015-02-20: 50 mg via INTRAVENOUS
  Administered 2015-02-20: 10 mg via INTRAVENOUS
  Administered 2015-02-20 (×2): 20 mg via INTRAVENOUS

## 2015-02-20 MED ORDER — EPHEDRINE SULFATE 50 MG/ML IJ SOLN
INTRAMUSCULAR | Status: AC
  Filled 2015-02-20: qty 1

## 2015-02-20 MED ORDER — SODIUM CHLORIDE 0.9 % IJ SOLN
INTRAMUSCULAR | Status: AC
  Filled 2015-02-20: qty 20

## 2015-02-20 MED ORDER — LACTATED RINGERS IR SOLN
Status: DC | PRN
  Administered 2015-02-20: 1000 mL

## 2015-02-20 MED ORDER — METOPROLOL TARTRATE 50 MG PO TABS
50.0000 mg | ORAL_TABLET | Freq: Two times a day (BID) | ORAL | Status: DC
  Administered 2015-02-21 – 2015-02-24 (×4): 50 mg via ORAL
  Filled 2015-02-20 (×2): qty 2
  Filled 2015-02-20: qty 1
  Filled 2015-02-20: qty 2

## 2015-02-20 MED ORDER — ALBUTEROL SULFATE (2.5 MG/3ML) 0.083% IN NEBU
3.0000 mL | INHALATION_SOLUTION | RESPIRATORY_TRACT | Status: DC | PRN
Start: 2015-02-20 — End: 2015-02-24

## 2015-02-20 MED ORDER — GLIPIZIDE ER 10 MG PO TB24
10.0000 mg | ORAL_TABLET | Freq: Every day | ORAL | Status: DC
  Administered 2015-02-21 – 2015-02-23 (×3): 10 mg via ORAL
  Filled 2015-02-20 (×5): qty 1

## 2015-02-20 MED ORDER — CLINDAMYCIN PHOSPHATE 900 MG/50ML IV SOLN
900.0000 mg | INTRAVENOUS | Status: AC
  Administered 2015-02-20: 900 mg via INTRAVENOUS
  Filled 2015-02-20: qty 50

## 2015-02-20 MED ORDER — SUFENTANIL CITRATE 50 MCG/ML IV SOLN
INTRAVENOUS | Status: DC | PRN
  Administered 2015-02-20 (×3): 5 ug via INTRAVENOUS
  Administered 2015-02-20 (×2): 2.5 ug via INTRAVENOUS
  Administered 2015-02-20: 5 ug via INTRAVENOUS
  Administered 2015-02-20 (×2): 10 ug via INTRAVENOUS

## 2015-02-20 MED ORDER — SUGAMMADEX SODIUM 500 MG/5ML IV SOLN
INTRAVENOUS | Status: AC
  Filled 2015-02-20: qty 5

## 2015-02-20 MED ORDER — CLONAZEPAM 0.5 MG PO TABS
0.5000 mg | ORAL_TABLET | Freq: Three times a day (TID) | ORAL | Status: DC | PRN
  Administered 2015-02-20 – 2015-02-23 (×5): 0.5 mg via ORAL
  Administered 2015-02-24: 1 mg via ORAL
  Filled 2015-02-20: qty 2
  Filled 2015-02-20 (×5): qty 1

## 2015-02-20 MED ORDER — ONDANSETRON HCL 4 MG/2ML IJ SOLN: 4.0000 mg | INTRAMUSCULAR | Status: DC | PRN

## 2015-02-20 MED ORDER — PROMETHAZINE HCL 25 MG/ML IJ SOLN: 6.2500 mg | INTRAMUSCULAR | Status: DC | PRN

## 2015-02-20 MED ORDER — POTASSIUM CHLORIDE CRYS ER 20 MEQ PO TBCR
20.0000 meq | EXTENDED_RELEASE_TABLET | Freq: Two times a day (BID) | ORAL | Status: DC
  Administered 2015-02-20 – 2015-02-24 (×8): 20 meq via ORAL
  Filled 2015-02-20 (×8): qty 1

## 2015-02-20 MED ORDER — TORSEMIDE 20 MG PO TABS
60.0000 mg | ORAL_TABLET | Freq: Every day | ORAL | Status: DC
  Administered 2015-02-21 – 2015-02-23 (×2): 60 mg via ORAL
  Filled 2015-02-20 (×6): qty 3

## 2015-02-20 MED ORDER — DIPHENHYDRAMINE HCL 50 MG/ML IJ SOLN: 12.5000 mg | Freq: Four times a day (QID) | INTRAMUSCULAR | Status: DC | PRN

## 2015-02-20 MED ORDER — ESTRADIOL 1 MG PO TABS
1.0000 mg | ORAL_TABLET | Freq: Every day | ORAL | Status: DC
  Administered 2015-02-20 – 2015-02-24 (×5): 1 mg via ORAL
  Filled 2015-02-20 (×5): qty 1

## 2015-02-20 MED ORDER — DILTIAZEM HCL ER COATED BEADS 240 MG PO CP24
240.0000 mg | ORAL_CAPSULE | Freq: Every day | ORAL | Status: DC
  Administered 2015-02-21 – 2015-02-24 (×3): 240 mg via ORAL
  Filled 2015-02-20 (×2): qty 2
  Filled 2015-02-20: qty 1

## 2015-02-20 MED ORDER — INSULIN ASPART 100 UNIT/ML ~~LOC~~ SOLN
0.0000 [IU] | Freq: Three times a day (TID) | SUBCUTANEOUS | Status: DC
  Administered 2015-02-20 (×2): 5 [IU] via SUBCUTANEOUS
  Administered 2015-02-21 (×3): 3 [IU] via SUBCUTANEOUS
  Administered 2015-02-22 – 2015-02-23 (×2): 2 [IU] via SUBCUTANEOUS
  Administered 2015-02-23: 3 [IU] via SUBCUTANEOUS

## 2015-02-20 MED ORDER — MAGNESIUM OXIDE 400 MG PO TABS
400.0000 mg | ORAL_TABLET | Freq: Four times a day (QID) | ORAL | Status: DC
  Administered 2015-02-20 – 2015-02-24 (×16): 400 mg via ORAL
  Filled 2015-02-20 (×34): qty 1

## 2015-02-20 MED ORDER — ONDANSETRON HCL 4 MG/2ML IJ SOLN
INTRAMUSCULAR | Status: DC | PRN
  Administered 2015-02-20: 4 mg via INTRAVENOUS

## 2015-02-20 MED ORDER — LIDOCAINE HCL (CARDIAC) 20 MG/ML IV SOLN
INTRAVENOUS | Status: DC | PRN
  Administered 2015-02-20: 75 mg via INTRAVENOUS

## 2015-02-20 MED ORDER — SENNA 8.6 MG PO TABS
1.0000 | ORAL_TABLET | Freq: Two times a day (BID) | ORAL | Status: DC
  Administered 2015-02-20 – 2015-02-24 (×8): 8.6 mg via ORAL
  Filled 2015-02-20 (×8): qty 1

## 2015-02-20 MED ORDER — SUGAMMADEX SODIUM 200 MG/2ML IV SOLN
INTRAVENOUS | Status: DC | PRN
  Administered 2015-02-20: 250 mg via INTRAVENOUS

## 2015-02-20 MED ORDER — STERILE WATER FOR IRRIGATION IR SOLN
Status: DC | PRN
  Administered 2015-02-20: 1000 mL

## 2015-02-20 MED ORDER — ACETAMINOPHEN 500 MG PO TABS
1000.0000 mg | ORAL_TABLET | Freq: Four times a day (QID) | ORAL | Status: AC
  Administered 2015-02-20 – 2015-02-21 (×4): 1000 mg via ORAL
  Filled 2015-02-20 (×4): qty 2

## 2015-02-20 MED ORDER — ONDANSETRON HCL 4 MG/2ML IJ SOLN
INTRAMUSCULAR | Status: AC
  Filled 2015-02-20: qty 2

## 2015-02-20 MED ORDER — PANTOPRAZOLE SODIUM 20 MG PO TBEC
20.0000 mg | DELAYED_RELEASE_TABLET | Freq: Every day | ORAL | Status: DC
Start: 2015-02-21 — End: 2015-02-24
  Administered 2015-02-21 – 2015-02-24 (×4): 20 mg via ORAL
  Filled 2015-02-20 (×4): qty 1

## 2015-02-20 MED ORDER — SODIUM CHLORIDE 0.9 % IV SOLN
20.0000 mg | INTRAVENOUS | Status: DC | PRN
  Administered 2015-02-20: 20 ug/min via INTRAVENOUS

## 2015-02-20 MED ORDER — INSULIN ASPART 100 UNIT/ML ~~LOC~~ SOLN
SUBCUTANEOUS | Status: AC
  Filled 2015-02-20: qty 1

## 2015-02-20 MED ORDER — PROPOFOL 10 MG/ML IV BOLUS
INTRAVENOUS | Status: AC
  Filled 2015-02-20: qty 20

## 2015-02-20 MED ORDER — HYDROMORPHONE HCL 1 MG/ML IJ SOLN
0.5000 mg | INTRAMUSCULAR | Status: DC | PRN
  Administered 2015-02-21 (×3): 0.5 mg via INTRAVENOUS
  Filled 2015-02-20 (×3): qty 1

## 2015-02-20 MED ORDER — SIMVASTATIN 20 MG PO TABS
20.0000 mg | ORAL_TABLET | Freq: Every day | ORAL | Status: DC
  Administered 2015-02-20 – 2015-02-23 (×4): 20 mg via ORAL
  Filled 2015-02-20: qty 1
  Filled 2015-02-20 (×3): qty 2

## 2015-02-20 MED ORDER — LEVOTHYROXINE SODIUM 25 MCG PO TABS
137.0000 ug | ORAL_TABLET | Freq: Every day | ORAL | Status: DC
  Administered 2015-02-21 – 2015-02-24 (×4): 137 ug via ORAL
  Filled 2015-02-20 (×5): qty 1

## 2015-02-20 MED ORDER — SUFENTANIL CITRATE 50 MCG/ML IV SOLN
INTRAVENOUS | Status: AC
  Filled 2015-02-20: qty 1

## 2015-02-20 MED ORDER — SUCCINYLCHOLINE CHLORIDE 20 MG/ML IJ SOLN
INTRAMUSCULAR | Status: DC | PRN
  Administered 2015-02-20: 100 mg via INTRAVENOUS

## 2015-02-20 MED ORDER — ALBUTEROL SULFATE (2.5 MG/3ML) 0.083% IN NEBU
3.0000 mL | INHALATION_SOLUTION | RESPIRATORY_TRACT | Status: DC
  Administered 2015-02-20: 3 mL via RESPIRATORY_TRACT
  Filled 2015-02-20: qty 3

## 2015-02-20 SURGICAL SUPPLY — 54 items
CHLORAPREP W/TINT 26ML (MISCELLANEOUS) ×5 IMPLANT
CLIP LIGATING HEM O LOK PURPLE (MISCELLANEOUS) ×2 IMPLANT
CLIP LIGATING HEMO LOK XL GOLD (MISCELLANEOUS) ×3 IMPLANT
CLIP LIGATING HEMO O LOK GREEN (MISCELLANEOUS) ×2 IMPLANT
COVER TIP SHEARS 8 DVNC (MISCELLANEOUS) ×1 IMPLANT
COVER TIP SHEARS 8MM DA VINCI (MISCELLANEOUS) ×2
DECANTER SPIKE VIAL GLASS SM (MISCELLANEOUS) ×3 IMPLANT
DRAIN CHANNEL 15F RND FF 3/16 (WOUND CARE) IMPLANT
DRAPE ARM DVNC X/XI (DISPOSABLE) ×4 IMPLANT
DRAPE COLUMN DVNC XI (DISPOSABLE) ×1 IMPLANT
DRAPE DA VINCI XI ARM (DISPOSABLE) ×8
DRAPE DA VINCI XI COLUMN (DISPOSABLE) ×2
DRAPE INCISE IOBAN 66X45 STRL (DRAPES) ×3 IMPLANT
DRAPE LAPAROSCOPIC ABDOMINAL (DRAPES) ×3 IMPLANT
DRAPE SHEET LG 3/4 BI-LAMINATE (DRAPES) ×3 IMPLANT
ELECT PENCIL ROCKER SW 15FT (MISCELLANEOUS) ×5 IMPLANT
ELECT REM PT RETURN 9FT ADLT (ELECTROSURGICAL) ×6
ELECTRODE REM PT RTRN 9FT ADLT (ELECTROSURGICAL) ×2 IMPLANT
EVACUATOR SILICONE 100CC (DRAIN) IMPLANT
GLOVE BIO SURGEON STRL SZ 6.5 (GLOVE) ×2 IMPLANT
GLOVE BIO SURGEONS STRL SZ 6.5 (GLOVE) ×1
GLOVE BIOGEL M STRL SZ7.5 (GLOVE) ×6 IMPLANT
GOWN STRL REUS W/TWL LRG LVL3 (GOWN DISPOSABLE) ×9 IMPLANT
KIT BASIN OR (CUSTOM PROCEDURE TRAY) ×3 IMPLANT
LIQUID BAND (GAUZE/BANDAGES/DRESSINGS) ×6 IMPLANT
LOOP VESSEL MAXI BLUE (MISCELLANEOUS) ×3 IMPLANT
MARKER SKIN DUAL TIP RULER LAB (MISCELLANEOUS) ×2 IMPLANT
NDL INSUFFLATION 14GA 120MM (NEEDLE) ×1 IMPLANT
NEEDLE INSUFFLATION 14GA 120MM (NEEDLE) ×3 IMPLANT
PORT ACCESS TROCAR AIRSEAL 12 (TROCAR) IMPLANT
PORT ACCESS TROCAR AIRSEAL 5M (TROCAR) ×2
POUCH ENDO CATCH II 15MM (MISCELLANEOUS) ×3 IMPLANT
RELOAD WHITE ECR60W (STAPLE) ×6 IMPLANT
SEAL CANN UNIV 5-8 DVNC XI (MISCELLANEOUS) ×4 IMPLANT
SEAL XI 5MM-8MM UNIVERSAL (MISCELLANEOUS) ×8
SET TRI-LUMEN FLTR TB AIRSEAL (TUBING) ×2 IMPLANT
SET TUBE IRRIG SUCTION NO TIP (IRRIGATION / IRRIGATOR) ×2 IMPLANT
SOLUTION ELECTROLUBE (MISCELLANEOUS) ×3 IMPLANT
SPONGE LAP 4X18 X RAY DECT (DISPOSABLE) ×3 IMPLANT
STAPLE ECHEON FLEX 60 POW ENDO (STAPLE) ×2 IMPLANT
SUT ETHILON 3 0 PS 1 (SUTURE) IMPLANT
SUT MNCRL AB 4-0 PS2 18 (SUTURE) ×6 IMPLANT
SUT PDS AB 1 CT1 27 (SUTURE) ×15 IMPLANT
SUT VIC AB 0 CT1 36 (SUTURE) ×2 IMPLANT
SUT VICRYL 0 UR6 27IN ABS (SUTURE) IMPLANT
TAPE STRIPS DRAPE STRL (GAUZE/BANDAGES/DRESSINGS) ×3 IMPLANT
TOWEL OR NON WOVEN STRL DISP B (DISPOSABLE) ×6 IMPLANT
TRAY FOLEY W/METER SILVER 14FR (SET/KITS/TRAYS/PACK) ×1 IMPLANT
TRAY FOLEY W/METER SILVER 16FR (SET/KITS/TRAYS/PACK) ×1 IMPLANT
TRAY LAPAROSCOPIC (CUSTOM PROCEDURE TRAY) ×3 IMPLANT
TROCAR BLADELESS OPT 12M 100M (ENDOMECHANICALS) ×3 IMPLANT
TROCAR BLADELESS OPT 5 100 (ENDOMECHANICALS) IMPLANT
TROCAR UNIVERSAL OPT 12M 100M (ENDOMECHANICALS) ×3 IMPLANT
WATER STERILE IRR 1500ML POUR (IV SOLUTION) ×6 IMPLANT

## 2015-02-20 NOTE — Discharge Instructions (Signed)
1.  Activity:  You are encouraged to ambulate frequently (about every hour during waking hours) to help prevent blood clots from forming in your legs or lungs.  However, you should not engage in any heavy lifting (> 10-15 lbs), strenuous activity, or straining. 2. Diet: You should advance your diet as instructed by your physician.  It will be normal to have some bloating, nausea, and abdominal discomfort intermittently. 3. Prescriptions:  You will be provided a prescription for pain medication to take as needed.  If your pain is not severe enough to require the prescription pain medication, you may take extra strength Tylenol instead which will have less side effects.  You should also take a prescribed stool softener to avoid straining with bowel movements as the prescription pain medication may constipate you. 4. Incisions: You may remove your dressing bandages 48 hours after surgery if not removed in the hospital.  You will either have some small staples or special tissue glue at each of the incision sites. Once the bandages are removed (if present), the incisions may stay open to air.  You may start showering (but not soaking or bathing in water) the 2nd day after surgery and the incisions simply need to be patted dry after the shower.  No additional care is needed. 5. What to call us about: You should call the office [phone]) if you develop fever > 101 or develop persistent vomiting.  You may resume aspirin, advil, aleve, vitamins, and supplements 7 days after surgery.  Resume Xarelto as per discussion with Dr. Tresa Moore and your cardiologist.

## 2015-02-20 NOTE — Progress Notes (Signed)
   02/20/15 1843  Vitals  BP (!) 86/40 mmHg  MAP (mmHg) 56  Pulse Rate (!) 55  ECG Heart Rate (!) 55  Resp 19  Oxygen Therapy  SpO2 98 %  Art Line  Arterial Line BP 82/44 mmHg  Arterial Line MAP (mmHg) 60 mmHg   Dr. Ottis Stain informed of blood pressure systolic bp mid [id number] for 20 minutes. Patient alert/oriented x4 and urine output adequate. Will page MD back in 30 minutes if blood pressure not improved. Will continue to monitor.

## 2015-02-20 NOTE — Anesthesia Preprocedure Evaluation (Addendum)
Anesthesia Evaluation  Patient identified by MRN, date of birth, ID band Patient awake    Reviewed: Allergy & Precautions, NPO status , Patient's Chart, lab work & pertinent test results  History of Anesthesia Complications (+) PROLONGED EMERGENCE, Family history of anesthesia reaction and history of anesthetic complications  Airway Mallampati: II  TM Distance: >3 FB Neck ROM: Full    Dental no notable dental hx.    Pulmonary shortness of breath and with exertion, asthma , sleep apnea and Oxygen sleep apnea ,    Pulmonary exam normal breath sounds clear to auscultation       Cardiovascular hypertension, Pt. on medications and Pt. on home beta blockers + angina + CAD, + Past MI, + Peripheral Vascular Disease and +CHF  Normal cardiovascular exam+ dysrhythmias  Rhythm:Regular Rate:Normal  ECHO: 08-02-13: Study Conclusions  - Left ventricle: The cavity size was normal. There was mild concentric hypertrophy. Systolic function was normal. The estimated ejection fraction was in the range of 55% to 60%. Wall motion was normal; there were no regional wall motion abnormalities. Doppler parameters are consistent with abnormal left ventricular relaxation (grade 1 diastolic dysfunction). - Mitral valve: Calcified annulus. There was trivial regurgitation. - Atrial septum: No defect or patent foramen ovale was identified.     Neuro/Psych PSYCHIATRIC DISORDERS Anxiety Depression  Neuromuscular disease    GI/Hepatic Neg liver ROS, hiatal hernia, GERD  Medicated,  Endo/Other  diabetes, Type 2, Insulin Dependent, Oral Hypoglycemic AgentsHypothyroidism Morbid obesity  Renal/GU Renal diseaseCr 1.52 K 4.2  negative genitourinary   Musculoskeletal  (+) Arthritis ,   Abdominal (+) + obese,   Peds negative pediatric ROS (+)  Hematology  (+) anemia , Hgb 9.0   Anesthesia Other Findings   Reproductive/Obstetrics negative OB  ROS                          Anesthesia Physical Anesthesia Plan  ASA: IV  Anesthesia Plan: General   Post-op Pain Management:    Induction: Intravenous  Airway Management Planned: Oral ETT  Additional Equipment: Arterial line  Intra-op Plan:   Post-operative Plan: Extubation in OR and Possible Post-op intubation/ventilation  Informed Consent: I have reviewed the patients History and Physical, chart, labs and discussed the procedure including the risks, benefits and alternatives for the proposed anesthesia with the patient or authorized representative who has indicated his/her understanding and acceptance.   Dental advisory given  Plan Discussed with: CRNA  Anesthesia Plan Comments:       Anesthesia Quick Evaluation
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
## 2015-02-20 NOTE — Progress Notes (Signed)
Post-op note  Subjective: The patient is doing well.  No complaints. Hb stable. Pain score 0. No respiratory distress. Some somnolence but easily arousable and A&O to place and time.  Objective: Vital signs in last 24 hours: Temp:  [97.4 F (36.3 C)-97.6 F (36.4 C)] 97.4 F (36.3 C) (01/25 1415) Pulse Rate:  [50-55] 54 (01/25 1430) Resp:  [10-18] 12 (01/25 1430) BP: (90-121)/(52-68) 120/68 mmHg (01/25 1400) SpO2:  [93 %-100 %] 95 % (01/25 1430) Arterial Line BP: (120-123)/(56-68) 121/57 mmHg (01/25 1430)  Intake/Output from previous day:   Intake/Output this shift: Total I/O In: 2909 [I.V.:2800; IV Piggyback:109] Out: 650 [Urine:280; Drains:120; Blood:250]  Physical Exam:  General: Alert and oriented. Abdomen: Soft, Nondistended. Incisions: Clean and dry. Dressed JP: thin, non-foul GU: Foley with clear, yellow urine  Lab Results:  Recent Labs  02/20/15 1340  HGB 8.3*  HCT 30.7*    Assessment/Plan: POD#0   1) RIGHT LG UTUCC s/p RIGHT nephroureterctomy 02/19/14 (no bladder cuff)  -PO Dilaudid with IV breakthrough  -Scheduled PO tylenol -MIVF -Clear liquids -JP drain to bulb suction -Senna/Colace -Peri-operative clinda/gent -Foley catheter peri-operatively, will plan to remove in AM -Stepdown status post-operatively  2) Renal Insufficiency - Cr 1.3-1.6 2016. Diabetic / HTN.  3) Atrial fibrillation-holding home Xarelto, home Cardiazem  4) CHF-on home Torsemide, Lopressor, will limit IVF  5) COPD-supplemental O2 as needed, no home inhalers listed as home meds, will monitor  6) Hypothyroidism- home levothyroxine     LOS: 0 days   Star Age 02/20/2015, 3:03 PM     I have seen and examined the patient and agree wti above assesmsent and plan. Doing well POD 0.

## 2015-02-20 NOTE — Anesthesia Procedure Notes (Signed)
Procedure Name: Intubation Date/Time: 02/20/2015 8:35 AM Performed by: Danley Danker L Patient Re-evaluated:Patient Re-evaluated prior to inductionOxygen Delivery Method: Circle system utilized Preoxygenation: Pre-oxygenation with 100% oxygen Intubation Type: IV induction Ventilation: Mask ventilation without difficulty and Oral airway inserted - appropriate to patient size Laryngoscope Size: Sabra Heck and 2 Grade View: Grade I Tube type: Subglottic suction tube Tube size: 7.5 mm Number of attempts: 1 Airway Equipment and Method: Stylet Placement Confirmation: ETT inserted through vocal cords under direct vision,  positive ETCO2 and breath sounds checked- equal and bilateral Secured at: 21 cm Tube secured with: Tape Dental Injury: Teeth and Oropharynx as per pre-operative assessment

## 2015-02-20 NOTE — Progress Notes (Signed)
Patient stated she was "treated last week for the Flu" . She was on a Zpack  And 3 shots of rocephin. No longer running a fever  or coughing up  green junk"

## 2015-02-20 NOTE — Op Note (Signed)
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  PROCEDURES: 1. Robotic-assisted laparoscopic right radical nephroureterectomy. 2. Laparoscopy with extensive adhesiolysis.  ASSISTANT:  Debbrah Alar, PA  ESTIMATED BLOOD LOSS:  200 mL.  COMPLICATION:  None.  SPECIMENS:  Right kidney and ureter.  FINDINGS: 1. Multifocal dense adhesions in the right hemiabdomen as anticipated,     mostly omental and mesenteric in the right upper quadrant and     midline. 2. Single-artery, single-vein renovascular anatomy. 3. Copious intraabdominal and retroperitoneal fat.  DRAINS: 1. Jackson-Pratt drain to bulb suction. 2. Foley catheter to straight drain.  INDICATION:  [name] is a pleasant, but quite comorbid, obese 75 year old lady who on workup have gross hematuria and was found to have a large filling defect in the upper pole of right kidney.  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  This was clinically localized.  There were no contralateral lesions or bladder lesions or evidence of distant disease.  Volume of tumor was quite impressive, not amenable to endoscopic retrograde therapy.  Options were discussed with the patient including percutaneous approach endoscopic surgery within the risk of possible tumor spillage versus purely palliative protocols versus radical surgery with nephroureterectomy and she adamantly wished to proceed with the latter.  We have discussed extensively preoperatively her comorbid conditions and she is in baseline balance with her cardiopulmonary status has been off  her blood thinners and was felt to be acceptable risk.  Informed consent was obtained and placed in the medical record.  PROCEDURE IN DETAIL:  The patient being [name], was verified. Procedure being right nephroureterectomy was confirmed.  Procedure was carried out.  Time-out was performed.  Intravenous antibiotics were administered.  General endotracheal anesthesia was introduced.  The patient was placed into a right side up full flank position, applying 15 degrees of stable flexion, superior arm elevator, axillary roll, sequential compression devices, bottom leg bent, top leg straight.  She was further fashioned on the operating table using 3-inch tape over foam padding across her supraxiphoid chest and her pelvis, and a sterile field was created by prepping and draping the patient's right flank and in the abdomen using chlorhexidine gluconate.  The Foley catheter had been previously placed well supine.  There were several scars across the right hemiabdomen consistent with known prior open cholecystectomy and re-exploration.  Great attention was given to this as well as her preoperative CT scan, which did appear to have a preserved fat planes in the right hemiabdomen.  As such, an attempted Veress technique pneumoperitoneum was performed in the right lower quadrant having passed the aspiration and drop test; however, the insufflation pressures were not excessively low; therefore, this was abandoned.  One additional attempt was performed in the subcostal location in the right lateral  aspect and again despite having passed the aspiration and drop test, insufflation pressures were immediately high, suggesting non- insufflation of the peritoneal cavity.  As such, a cutdown technique was then used and positioned that approximately her mammary line approximately 1-1/2 handbreadths inferior to the costal margin. Dissection was carried down through the subcutaneous fat, external  and internal fascia and finally cold incision of the peritoneum, digital inspection via the surgeon's finger, the site corroborated intraperitoneal dissection and a blunt trocar was placed 8-mm and insufflation was achieved.  Laparoscopic examination of the peritoneal cavity revealed numerous dense adhesions in the right upper quadrant, right hemiabdomen as anticipated.  These were mostly mesenteric or omental.  There were extensive omental adhesions along the right liver edge making the liver immobile and some mesenteric adhesions of the anterior abdominal wall.  It was immediately evident that extensive adhesiolysis would be warranted, but it did appear to be amenable to this technique.  As such, a right far lateral 8-mm robotic port was placed and positioned approximately 1 handbreadth superomedial to the anterior superior iliac spine and an 8-mm robotic port in the paramedian inferior location approximately 1-1/2 handbreadths superior to the pubic ramus and a 10-mm AirSeal port in the paramedian location approximately 1-1/2 handbreadths superomedial to the inferior most robotic port site. All these were placed under direct laparoscopic vision.  Next, approximately 45 minutes of extensive adhesiolysis was performed using very careful dissection with laparoscopic cold scissors releasing the omental and mesenteric fat adhesions from the inferior liver edge in right upper quadrant.  This allowing mobility of the inferolateral aspect of the liver.  As such, a 5-mm port was placed approximately 3 fingerbreadths inferior to the costal margin and the liver was very carefully placed in gentle superior traction with a self-locking grasper anterior to the posterior aspect of the abdomen and an 8-mm robotic camera port and positioned approximately 4 fingerbreadths inferolateral to this.  Robot was then docked and passed through the electronic checks.  Initial attention was directed at slight  amount more of adhesiolysis allowing more mobility of the inferior aspect of the liver superiorly.  Attention was then directed at development of retroperitoneum.  Incision was made lateral to the ascending colon from the area of the cecum towards the area of the hepatic flexure and this was carefully swept medially.  The retroperitoneum was absolutely full of copious fat making the dissection quite tedious.  The lower pole of kidney was identified and placed on gentle lateral traction.  Dissection proceeded inferomedial to this and the ureter was also encountered, it obviously did have a stent in place.  This was placed on gentle lateral traction.  The psoas muscle was identified.  Dissection was proceeded within this triangle superiorly towards the area of the renal hilum. Renal hilum consisted of single artery, single vein, renovascular anatomy as anticipated.  The artery was controlled using extra large Hem- O-Lok clip proximally followed by control of the vein using vascular load stapler and the control of the artery distally using vascular load stapler.  This resulted in excellent hemostatic control of the hilum. Superior medial attachments were taken down, and appeared to be an adrenal sparing plane; however, given the amount of retroperitoneal fat, this was somewhat difficult to ascertain.  Superior attachments were then taken down using cautery scissors as her lateral attachments and inferior medial attachments as well.  Such that the kidney was just tethered by the stented ureter.  Circumferential mobilization of the ureter was performed distally,  very carefully passing the area of the iliac vessels towards the area of the ureterovesical junction.  Given the amount of abdominal fat, the bladder junction itself could not be well visualized, but we were happy with the dissection being within 2-3 cm of this location.  Given risk-benefit analysis of further dissection inferiorly and  bladder cuff dissection given an upper pole, low-grade tumor, it was felt that proceeding with a non-formation of formal bladder cuff dissection was referred.  As such, a very small nick was made in the renal pelvis.  The stent was removed and an extra large Hem- O-Lok clip was placed distally and proximally of the distal most ureter, which was then coldly transected, was carefully mobilized the nephroureterectomy specimen.  Great care was taken to avoid urinary spillage, which did not occur.  The specimen given its amount of such retroperitoneal fat was quite massive, it was not felt that it would easily fit into any sort of retrieval bag.  As such, decision was made to extract the specimen to a Noberto Retort type extraction incision at the area of the lower most robotic port site.  Robot was then undocked and incision was performed at this site for total distance approximately 8 cm.  External and internal fascia were carefully dissected free as was the perineum and via the extraction site, the nephroureterectomy specimen was carefully delivered and set aside for permanent pathology. Closed suction drain was brought through the lateral most robotic port site through the peritoneal cavity.  Drain stitch was applied.  The inspection and palpation through the extraction port site, no evidence of visceral injury was noted.  The extraction site was closed at the level of fascia using figure-of-eight PDS x8.  All incision sites were infiltrated with dilute lyophilized Marcaine.  The extraction site was further closed at the level of the Scarpa's using running Vicryl and all incision sites were closed at the level of the skin using subcuticular Monocryl followed by Dermabond.  Procedure was then terminated.  The patient tolerated the procedure well.  There were no immediate periprocedural complications.  Given her comorbid conditions, she was taken to the postanesthesia care unit in stable condition  with plan for step-down admission overnight.          ______________________________ Alexis Frock, MD     TM/MEDQ  D:  02/20/2015  T:  02/20/2015  Job:  [id number]

## 2015-02-20 NOTE — Anesthesia Postprocedure Evaluation (Signed)
Anesthesia Post Note  Patient: [name]  Procedure(s) Performed: Procedure(s) (LRB): ROBOT ASSISTED LAPAROSCOPIC NEPHROURETERECTOMY,extensive lysis of adhesiions (Right)  Patient location during evaluation: PACU Anesthesia Type: General Level of consciousness: awake and alert Pain management: pain level controlled Vital Signs Assessment: post-procedure vital signs reviewed and stable Respiratory status: spontaneous breathing, nonlabored ventilation, respiratory function stable and patient connected to nasal cannula oxygen Cardiovascular status: blood pressure returned to baseline and stable Postop Assessment: no signs of nausea or vomiting Anesthetic complications: no    Last Vitals:  Filed Vitals:   02/20/15 1415 02/20/15 1430  BP:    Pulse: 54 54  Temp: 36.3 C   Resp: 18 12    Last Pain:  Filed Vitals:   02/20/15 1437  PainSc: 0-No pain                 Effie Berkshire

## 2015-02-20 NOTE — Brief Op Note (Signed)
02/20/2015  12:49 PM  PATIENT:  [name]  75 y.o. female  PRE-OPERATIVE DIAGNOSIS:  RIGHT RENAL PELVIS CANCER  POST-OPERATIVE DIAGNOSIS:  right renal pelvis cancer  PROCEDURE:  Procedure(s): ROBOT ASSISTED LAPAROSCOPIC NEPHROURETERECTOMY,extensive lysis of adhesiions (Right)  SURGEON:  Surgeon(s) and Role:    * Alexis Frock, MD - Primary  PHYSICIAN ASSISTANT:   ASSISTANTS: 1 - Debbrah Alar, PA   ANESTHESIA:   general  EBL:  Total I/O In: 1000 [I.V.:1000] Out: 500 [Urine:250; Blood:250]  BLOOD ADMINISTERED:none  DRAINS: 1 - JP to bulb, 2 - foley to gravity   LOCAL MEDICATIONS USED:  MARCAINE     SPECIMEN:  Source of Specimen:  Rt kidney and ureter  DISPOSITION OF SPECIMEN:  PATHOLOGY  COUNTS:  YES  TOURNIQUET:  * No tourniquets in log *  DICTATION: .Other Dictation: Dictation Number [id number]  PLAN OF CARE: Admit to inpatient   PATIENT DISPOSITION:  PACU - hemodynamically stable.   Delay start of Pharmacological VTE agent (>24hrs) due to surgical blood loss or risk of bleeding: yes

## 2015-02-20 NOTE — Transfer of Care (Signed)
Immediate Anesthesia Transfer of Care Note  Patient: [name]  Procedure(s) Performed: Procedure(s): ROBOT ASSISTED LAPAROSCOPIC NEPHROURETERECTOMY,extensive lysis of adhesiions (Right)  Patient Location: PACU  Anesthesia Type:General  Level of Consciousness: awake  Airway & Oxygen Therapy: Patient Spontanous Breathing and Patient connected to face mask oxygen  Post-op Assessment: Report given to RN and Post -op Vital signs reviewed and stable  Post vital signs: Reviewed and stable  Last Vitals:  Filed Vitals:   02/20/15 0651 02/20/15 1319  BP: 90/52 118/63  Pulse: 50 52  Temp: 36.4 C 36.3 C  Resp: 16 14    Complications: No apparent anesthesia complications

## 2015-02-21 LAB — GLUCOSE, CAPILLARY
GLUCOSE-CAPILLARY: 191 mg/dL — AB (ref 65–99)
GLUCOSE-CAPILLARY: 166 mg/dL — AB (ref 65–99)
GLUCOSE-CAPILLARY: 192 mg/dL — AB (ref 65–99)
Glucose-Capillary: 164 mg/dL — ABNORMAL HIGH (ref 65–99)

## 2015-02-21 LAB — POCT I-STAT 7, (LYTES, BLD GAS, ICA,H+H)
ACID-BASE EXCESS: 5 mmol/L — AB (ref 0.0–2.0)
BICARBONATE: 29.9 meq/L — AB (ref 20.0–24.0)
Calcium, Ion: 0.99 mmol/L — ABNORMAL LOW (ref 1.13–1.30)
HCT: 30 % — ABNORMAL LOW (ref 36.0–46.0)
Hemoglobin: 10.2 g/dL — ABNORMAL LOW (ref 12.0–15.0)
O2 SAT: 99 %
PCO2 ART: 40.9 mmHg (ref 35.0–45.0)
PH ART: 7.465 — AB (ref 7.350–7.450)
PO2 ART: 139 mmHg — AB (ref 80.0–100.0)
Patient temperature: 35.5
Potassium: 3.4 mmol/L — ABNORMAL LOW (ref 3.5–5.1)
Sodium: 133 mmol/L — ABNORMAL LOW (ref 135–145)
TCO2: 31 mmol/L (ref 0–100)

## 2015-02-21 LAB — BASIC METABOLIC PANEL
Anion gap: 15 (ref 5–15)
BUN: 17 mg/dL (ref 6–20)
CHLORIDE: 95 mmol/L — AB (ref 101–111)
CO2: 26 mmol/L (ref 22–32)
CREATININE: 2.4 mg/dL — AB (ref 0.44–1.00)
Calcium: 8.5 mg/dL — ABNORMAL LOW (ref 8.9–10.3)
GFR, EST AFRICAN AMERICAN: 22 mL/min — AB (ref >60)
GFR, EST NON AFRICAN AMERICAN: 19 mL/min — AB (ref >60)
Glucose, Bld: 192 mg/dL — ABNORMAL HIGH (ref 65–99)
POTASSIUM: 4.2 mmol/L (ref 3.5–5.1)
SODIUM: 136 mmol/L (ref 135–145)

## 2015-02-21 LAB — HEMOGLOBIN AND HEMATOCRIT, BLOOD
HEMATOCRIT: 31.8 % — AB (ref 36.0–46.0)
HEMOGLOBIN: 9.4 g/dL — AB (ref 12.0–15.0)

## 2015-02-21 LAB — MAGNESIUM: Magnesium: 1.9 mg/dL (ref 1.7–2.4)

## 2015-02-21 MED ORDER — ACETAMINOPHEN 500 MG PO TABS: 1000.0000 mg | ORAL_TABLET | Freq: Four times a day (QID) | ORAL | Status: DC

## 2015-02-21 MED ORDER — ACETAMINOPHEN 500 MG PO TABS
1000.0000 mg | ORAL_TABLET | Freq: Four times a day (QID) | ORAL | Status: DC
  Administered 2015-02-21 – 2015-02-22 (×3): 1000 mg via ORAL
  Filled 2015-02-21 (×4): qty 2

## 2015-02-21 NOTE — Progress Notes (Signed)
Post-op note  Subjective: Progressing. Out of bed to chair but no real ambulation. Discussed again with family and nursing staff who will help her ambulate around room and at least 1 lap in hallway. Some belching and mild nausea with some vegetables. Tolerating clears. No flatus or BM. Instructed to take things slowly and to stick with clears given belching and mild nausea. Voiding without difficulty.  Objective: Vital signs in last 24 hours: Temp:  [97.3 F (36.3 C)-98.7 F (37.1 C)] 97.9 F (36.6 C) (01/26 1200) Pulse Rate:  [49-72] 60 (01/26 1200) Resp:  [13-23] 16 (01/26 1200) BP: (86-118)/(40-51) 118/51 mmHg (01/26 1200) SpO2:  [93 %-100 %] 97 % (01/26 1200) Arterial Line BP: (82-126)/(41-68) 115/57 mmHg (01/26 1100)  Intake/Output from previous day: 01/25 0701 - 01/26 0700 In: 4930.7 [I.V.:4161.7; Blood:660; IV Piggyback:109] Out: 75 [Urine:865; Drains:250; Blood:250] Intake/Output this shift: Total I/O In: -  Out: 890 [Urine:850; Drains:40]  Physical Exam:  General: Alert and oriented. Pulm: NWOB with SaO2 95+ on 2LNC Abdomen: Soft, Nondistended. Incisions: Clean and dry. Dressed JP: thin, ss, non-foul GU: no foley  Lab Results:  Recent Labs  02/20/15 1340 02/20/15 2118 02/21/15 0650  HGB 8.3* 7.7* 9.4*  HCT 30.7* 27.4* 31.8*    Assessment/Plan: POD#1 s/p RIGHT LG UTUCC s/p RIGHT nephroureterctomy 02/19/14 (no bladder cuff)  -Continue current care plan -OOB/ambulate around room and in hallway at least x 1 -Stick with clear liquids until passing more flatus -PT/OT consulted and will see in AM     LOS: 1 day   Star Age 02/21/2015, 2:44 PM     I have seen and examined the patient and agree with above plan.  Briely, S:  1 - Right Renal Pelvis Cancer - s/p robotic right nephroureterctomy 02/19/14 (no bladder cuff). Foley removed 1/26. Path pending.  2 - Anemia - pt with pre-op anemia, Hgb about 9. Drop to high 7's POD 1 therefore transfused  2upRBC given h/o ischemia and CHF to avoid excessive crystalloid volume.  3- Disposition - lives at home at baseline, but with limited functional reserve. PT eval pending for Dc recs.  O: NAD, family at bedside, Hgb 9's after transfusion (appropriate) AOx3 Obese abd with JP, inicisions c/d/i.  SCD's in place Non-labored breathing on [place] O2.  A/P Doing well POD 1. Daily labs, PT eval, judicious fluids, DC foley, likely transfer to med-surg floor tomorrow.

## 2015-02-21 NOTE — Progress Notes (Signed)
PHARMACIST - PHYSICIAN COMMUNICATION DR:  Ottis Stain  CONCERNING:  METFORMIN SAFE ADMINISTRATION POLICY  RECOMMENDATION: Metformin has been placed on DISCONTINUE (rejected order) STATUS and should be reordered only after any of the conditions below are ruled out.  Current Safety recommendations include avoiding metformin for a minimum of 48 hours after the patient's exposure to intravenous contrast media for the following conditions:  . eGFR < 60 ml/min  . Liver disease, alcoholism, heart failure, intra-arterial administration of contrast  DESCRIPTION:  The Pharmacy Committee has adopted a policy that restricts the use of metformin in hospitalized patients until all the contraindications to administration have been ruled out. Specific contraindications are: '[]'  Serum creatinine ? 1.5 for males '[x]'  Serum creatinine ? 1.4 for females '[]'  Shock, acute MI, sepsis, hypoxemia, dehydration '[]'  Planned administration of intravenous iodinated contrast media when eGFR < 55m/min, Liver Disease, alcoholism, heart failure or intra-arterial administration of contrast '[]'  Heart Failure patients with low EF '[]'  Acute or chronic metabolic acidosis (including DKA)  [name] PharmD, pager [phone] 02/21/2015,12:54 PM.

## 2015-02-21 NOTE — Care Management Note (Signed)
Case Management Note  Patient Details  Name: [name] MRN: [id number] Date of Birth: 1940/12/14  Subjective/Objective:           Post op nephrectomy         Action/Plan:Date: February 21, 2015 Chart reviewed for concurrent status and case management needs. Will continue to follow patient for changes and needs: Velva Harman, BSN, CCM, RN,   [phone]   Expected Discharge Date:                  Expected Discharge Plan:  Home/Self Care  In-House Referral:  NA  Discharge planning Services  CM Consult  Post Acute Care Choice:  NA Choice offered to:  NA  DME Arranged:    DME Agency:     HH Arranged:    HH Agency:     Status of Service:  In process, will continue to follow  Medicare Important Message Given:    Date Medicare IM Given:    Medicare IM give by:    Date Additional Medicare IM Given:    Additional Medicare Important Message give by:     If discussed at [place] of Stay Meetings, dates discussed:    Additional Comments:  Leeroy Cha, RN 02/21/2015, 8:48 AM

## 2015-02-21 NOTE — Progress Notes (Addendum)
Post-op note  Subjective: NAEON. Hypotensive to the [id number] systolic with MAPs hovering in the 50s with UOP around 20/hr. Repeat Hb 7.7. Transfused 2 uPRBC to replace volume. Pain controlled. No N/V. Tolerating clears. No clinical signs of fluid overload. Denies SOB, cough.  Objective: Vital signs in last 24 hours: Temp:  [97.3 F (36.3 C)-98.7 F (37.1 C)] 97.9 F (36.6 C) (01/26 0412) Pulse Rate:  [49-66] 65 (01/26 0500) Resp:  [10-23] 21 (01/26 0412) BP: (86-121)/(40-68) 109/47 mmHg (01/25 2139) SpO2:  [93 %-100 %] 98 % (01/26 0500) Arterial Line BP: (82-126)/(41-68) 123/62 mmHg (01/26 0500)  Intake/Output from previous day: 01/25 0701 - 01/26 0700 In: 4930.7 [I.V.:4161.7; Blood:660; IV Piggyback:109] Out: 1365 [Urine:865; Drains:250; Blood:250] Intake/Output this shift: Total I/O In: 1660 [I.V.:1000; Blood:660] Out: 525 [Urine:435; Drains:90]  Physical Exam:  General: Alert and oriented. Pulm: NWOB with SaO2 95+ on 2LNC Abdomen: Soft, Nondistended. Incisions: Clean and dry. Dressed JP: thin, ss, non-foul GU: Foley with clear, yellow urine  Lab Results:  Recent Labs  02/20/15 1340 02/20/15 2118  HGB 8.3* 7.7*  HCT 30.7* 27.4*    Assessment/Plan: POD#1   1) RIGHT LG UTUCC s/p RIGHT nephroureterctomy 02/19/14 (no bladder cuff)  -PO Dilaudid with IV breakthrough  -Scheduled PO tylenol -Saline lock -Wean O2 -D/C a-line -Advance diet as tolerated -JP drain to bulb suction -Senna/Colace -Dulcolax suppository -Peri-operative clinda/gent -S/p 2 units pRBC with appropriate response to 9.4 -D/C foley -IS -OOB/amb -Plan to keep in stepdown throughout the day -PT consult  2) Renal Insufficiency - Cr 1.3-1.6 2016. Diabetic / HTN. Cr 2.4 POD 1  3) Atrial fibrillation-holding home Xarelto, home Cardiazem, sinus bradycardia by monitor.  4) Chronic, Diastolic CHF-on home Torsemide, Lopressor, will limit IVF  5) COPD-supplemental O2 as needed, no home inhalers  listed as home meds, will monitor  6) Hypothyroidism- home levothyroxine     LOS: 1 day   Star Age 02/21/2015, 6:23 AM     I have seen and examined the patient as outlined above and by separate progress noted on same date authored by me.

## 2015-02-21 NOTE — Clinical Documentation Improvement (Signed)
Urology  Can the diagnosis of CHF be further specified?    Acuity - Acute, Chronic, Acute on Chronic   Type - Systolic, Diastolic, Systolic and Diastolic  Other  Clinically Undetermined  Document any associated diagnoses/conditions Please update your documentation within the medical record to reflect your response to this query. Thank you.  Supporting Information: (As per notes) "CHF (congestive heart failure) ([place])"  Please exercise your independent, professional judgment when responding. A specific answer is not anticipated or expected.  Thank You, Alessandra Grout, RN, BSN, CCDS,Clinical Documentation Specialist:  [phone]  [phone]=[facility] [place]- Health Information Management

## 2015-02-22 LAB — BASIC METABOLIC PANEL
ANION GAP: 11 (ref 5–15)
BUN: 25 mg/dL — ABNORMAL HIGH (ref 6–20)
CALCIUM: 8.4 mg/dL — AB (ref 8.9–10.3)
CO2: 33 mmol/L — ABNORMAL HIGH (ref 22–32)
Chloride: 93 mmol/L — ABNORMAL LOW (ref 101–111)
Creatinine, Ser: 2.39 mg/dL — ABNORMAL HIGH (ref 0.44–1.00)
GFR, EST AFRICAN AMERICAN: 22 mL/min — AB (ref >60)
GFR, EST NON AFRICAN AMERICAN: 19 mL/min — AB (ref >60)
GLUCOSE: 138 mg/dL — AB (ref 65–99)
POTASSIUM: 3.9 mmol/L (ref 3.5–5.1)
SODIUM: 137 mmol/L (ref 135–145)

## 2015-02-22 LAB — HEMOGLOBIN AND HEMATOCRIT, BLOOD
HEMATOCRIT: 33.9 % — AB (ref 36.0–46.0)
HEMOGLOBIN: 9.8 g/dL — AB (ref 12.0–15.0)

## 2015-02-22 LAB — GLUCOSE, CAPILLARY
GLUCOSE-CAPILLARY: 109 mg/dL — AB (ref 65–99)
GLUCOSE-CAPILLARY: 166 mg/dL — AB (ref 65–99)
Glucose-Capillary: 96 mg/dL (ref 65–99)
Glucose-Capillary: 124 mg/dL — ABNORMAL HIGH (ref 65–99)

## 2015-02-22 LAB — TYPE AND SCREEN
ABO/RH(D): A POS
Antibody Screen: NEGATIVE
UNIT DIVISION: 0
Unit division: 0

## 2015-02-22 MED ORDER — HEPARIN SODIUM (PORCINE) 5000 UNIT/ML IJ SOLN
5000.0000 [IU] | Freq: Three times a day (TID) | INTRAMUSCULAR | Status: DC
  Administered 2015-02-22 – 2015-02-24 (×7): 5000 [IU] via SUBCUTANEOUS
  Filled 2015-02-22 (×7): qty 1

## 2015-02-22 MED ORDER — ACETAMINOPHEN 500 MG PO TABS
1000.0000 mg | ORAL_TABLET | Freq: Three times a day (TID) | ORAL | Status: DC
  Administered 2015-02-22 – 2015-02-24 (×7): 1000 mg via ORAL
  Filled 2015-02-22 (×7): qty 2

## 2015-02-22 NOTE — Progress Notes (Signed)
Pt thinks she may have aspirated some food when eating. Says she "choked" on some of her lunch and was unable to spit it out. Pt and family very anxious. Pt sweating and has the chills. Lung sounds unchanged from this morning. VSS. Dr. Tresa Moore notified, no new orders. Pt given Klonopin and appears to be much calmer. Will continue to monitor.

## 2015-02-22 NOTE — Plan of Care (Signed)
Problem: Activity: Goal: Risk for activity intolerance will decrease Outcome: Progressing Pt ambulated roughly 30 feet using walker with no other assist.

## 2015-02-22 NOTE — Progress Notes (Addendum)
Post-op note  Subjective: NAEON. Pain adequately controlled. Voiding without difficulty, denies n/v with continued clear liquid intake and a few bites of a sandwich. Ambulating minimally, will benefit from PT assistance. No flatus or BM yet, burping decreasing.  Objective: Vital signs in last 24 hours: Temp:  [97.4 F (36.3 C)-98.1 F (36.7 C)] 98.1 F (36.7 C) (01/27 0400) Pulse Rate:  [52-72] 52 (01/27 0615) Resp:  [15-27] 17 (01/27 0615) BP: (89-128)/(44-64) 89/45 mmHg (01/27 0615) SpO2:  [92 %-99 %] 99 % (01/27 0615) Arterial Line BP: (102-122)/(49-57) 115/57 mmHg (01/26 1100) Weight:  [102 kg (224 lb 13.9 oz)] 102 kg (224 lb 13.9 oz) (01/27 0615)  Intake/Output from previous day: 01/26 0701 - 01/27 0700 In: 760 [P.O.:760] Out: 2140 [Urine:2000; Drains:140] Intake/Output this shift:    Physical Exam:  General: Alert and oriented. Pulm: NWOB with SaO2 95+ on 2LNC Abdomen: Soft, Nondistended. Incisions: Clean and dry. Dressed JP: thin, ss, non-foul GU: no foley  Lab Results:  Recent Labs  02/20/15 2118 02/21/15 0650 02/22/15 0310  HGB 7.7* 9.4* 9.8*  HCT 27.4* 31.8* 33.9*    Assessment/Plan: 1) POD #2 s/p RIGHT LG UTUCC s/p RIGHT nephroureterctomy 02/19/14 (no bladder cuff)   -PO Dilaudid with IV breakthrough  -Scheduled PO tylenol -Wean O2 -Advance diet as tolerated -d/c JP drain -Senna/Colace -Dulcolax suppository -D/C foley -OOB/amb -Transfer to floor -PT consult  2) acute blood loss anemia 2/2 surgery- Hb stable following transfusion of 2uPRBC on 02/20/15  3) Renal Insufficiency - Cr 1.3-1.6 2016. Diabetic / HTN. Cr 2.4 POD 1, stable today  4) Atrial fibrillation-holding home Xarelto, home Cardiazem, amiodarone, sinus bradycardia by monitor.  5) Chronic, Diastolic CHF-on home Torsemide, Lopressor, will limit IVF, now medlocked  6) COPD-supplemental O2 as needed, no home inhalers listed as home meds, will monitor  7) Hypothyroidism- home  levothyroxine  8) DM-BS <200 (160-190 range)    LOS: 2 days   Star Age 02/22/2015, 7:06 AM     .I have seen and examined the patient and agree with above assesment and plan.  Briefly,  S: NAD POD 2 s/p nephro-uretereoctomy.  NAD, family at bedside Non-labored breathing on [place] O2 RRR by bedside monitor JP sith scan serous output, removed and dry dressing applied Incision sites c/d/i SCD's in place.  Hgb stabilized.   A/P Doing well POD2. Transfer to floor. PT, likely DC over weekend if progressing.

## 2015-02-22 NOTE — Discharge Summary (Addendum)
Date of admission: 02/20/2015  Date of discharge: 02/22/2015  Admission diagnosis: RIGHT upper tract UCC  Discharge diagnosis: Same as above  Secondary diagnoses:  Past Medical History  Diagnosis Date  . Hypertensive heart disease   . Hyperlipidemia   . Hypothyroidism   . Obesity (BMI 30-39.9)   . GERD (gastroesophageal reflux disease)   . Degenerative disc disease, cervical   . Vitamin D deficiency   . Depression   . Bell's palsy   . Gastroparesis   . Internal hemorrhoid   . Hiatal hernia   . Anxiety   . Osteoarthritis   . Adenomatous colon polyp 02/13/09  . Diverticulosis   . Status post dilation of esophageal narrowing   . IBS (irritable bowel syndrome)   . PAF (paroxysmal atrial fibrillation) (HCC)     a. chronic xarelto   . Diabetes mellitus without complication ([place])   . Diverticulitis   . Syncope     a. 12/2012: MDT Reveal LINQ ILR placed;  b. 12/2012 Echo: EF 45-50%, Gr 3 DD, mild MR, mildly dil LA;  c. 12/2012 Carotid U/S: 1-39% bilat ICA stenosis.  Marland Kitchen CAD (coronary artery disease)     a. 12/2012 Nonobstructive by cath: LM nl, LAD 50p/m, LCX 50-48m (FFR 0.93), RCA min irregs, EF 55-65%-->Med Rx. b. L&RHC 03/21/2014 EF 50-55%, 50% eccentric LCx stenosis with negative FFR, 40% ostial RCA stenosis, 40-50% mid LAD stenosis   . Neuropathy ([place])   . Atrial flutter ([place])     a. By ILR interrogation.  Marland Kitchen NICM (nonischemic cardiomyopathy) ([place])     a. EF 45% with grade 3 diastolic dysfunction by echo 12/2012.  Marland Kitchen Hypertension   . Dysrhythmia     a-fibrillation, atrial flutter  . CHF (congestive heart failure) ([place])     had it 2 years ago  . Gout   . Neuropathy of both feet ([place])   . Shortness of breath dyspnea     with exertion  . Asthma   . Oxygen dependent     patient uses 1l at rest and 2L with exertion   . Complication of anesthesia     with last ablation-09/11/2014, Dr. Rayann Heman told her that hey had a very hard time waking her up-almost didn't get her woke up!  .  Family history of adverse reaction to anesthesia     pts mother had heart issues with anesthesia (cardiac arrest); father had difficulty awakening   . Anginal pain ([place])   . Myocardial infarction ([place])     2014  . Hematuria   . Right renal mass   . Sleep apnea     pt scored 5 per stop bang tool per PAT visit 02/14/2015; results sent to PCP Dr Melina Copa      History and Physical: For full details, please see admission history and physical. Briefly, [name] is a 75 y.o. year old patient with complex medical history and large volume, low grade RIGHT UTUCC in the renal pelvis.   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 Found on eval gross hematuria. 1 arter / 1 vein right renovascular anatomy with rel large rt gonadal inserting into anterior IVC at level of renal vein. Clinically localized. No bladder or contralateral lesions. 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 She has JJ stent in situ following most recent tumro biopsy 11/2014. She appears to have  preserved fat planes around RUQ and skin / bowel.   S/p RIGHT robot-assisted laparoscopic nephroureterectomy (no bladder cuff) on 02/20/15. Transfused 2uPRBC POD 0 for hypotension and Hb 7.7. Appropriate response to 2u PRBC. Diet slowly advanced, tolerating regular diet on POD 2. Pain controlled on oral meds. PT/OT consulted to assist with ambulation. By POD#4 she was tolerating a regular diet and pain controlled.   2- Renal Insufficiency - Cr 1.3-1.6 2016 (gfr 31). Diabetic / HTN. Cr leveled off about 2.54, gfr 18. I reviewed meds and no change in dosing or no change in dosing from GFR < 50.    3-acute blood loss anemia secondary to surgery- Transfused 2uPRBC POD 0 for hypotension and Hb 7.7. Appropriate response. Stable Hb at discharge.  4- Atrial fibrillation-recommend to restart Xarelto  7 days post surgery - discuss dose with primary care physician and cardiologist.   5-chronic diastolic CHF-resume home meds  6- COPD-resume home meds  Laboratory values:  Recent Labs  02/20/15 2118 02/21/15 0650 02/22/15 0310  HGB 7.7* 9.4* 9.8*  HCT 27.4* 31.8* 33.9*    Recent Labs  02/21/15 0650 02/22/15 0310  CREATININE 2.40* 2.39*   BP 123/43 mmHg  Pulse 61  Temp(Src) 98.8 F (37.1 C) (Oral)  Resp 15  Ht 5\' 2"  (1.575 m)  Wt 102 kg (224 lb 13.9 oz)  BMI 41.12 kg/m2  SpO2 100%   Physical Exam:  Vital signs in last 24 hours: Temp:  [97.4 F (36.3 C)-98.8 F (37.1 C)] 98.8 F (37.1 C) (01/27 1300) Pulse Rate:  [48-64] 61 (01/27 1355) Resp:  [15-27] 15 (01/27 1355) BP: (87-128)/(42-64) 123/43 mmHg (01/27 1355) SpO2:  [92 %-100 %] 100 % (01/27 1355) Weight:  [102 kg (224 lb 13.9 oz)] 102 kg (224 lb 13.9 oz) (01/27 0615) Constitutional:  Alert and oriented, No acute distress Cardiovascular: Regular rate and rhythm, No JVD Respiratory: Normal respiratory effort, SORA GI: Abdomen is soft, nontender, nondistended, no abdominal masses, RLQ Gibson incision well healed, clear serous drainage from RLQ JP site.  Neurologic: Grossly intact, no focal deficits Psychiatric: Normal mood and affect   Disposition: Home  Discharge instruction: The patient was instructed to be ambulatory but told to refrain from heavy lifting, strenuous activity, or driving.   Discharge medications:    Medication List    STOP taking these medications        nitrofurantoin 100 MG capsule  Commonly known as:  MACRODANTIN     phenazopyridine 200 MG tablet  Commonly known as:  PYRIDIUM     vitamin B-12 500 MCG tablet  Commonly known as:  CYANOCOBALAMIN     VITAMIN D (ERGOCALCIFEROL) PO     XARELTO 20 MG Tabs tablet  Generic drug:  rivaroxaban      TAKE these medications        ACCU-CHEK SMARTVIEW test strip  Generic drug:  glucose blood  1 each by Other route See admin  instructions. Check blood sugar 3-4 times daily     acetaminophen 500 MG tablet  Commonly known as:  TYLENOL  Take 1,000 mg by mouth every 6 (six) hours as needed (pain).     amiodarone 200 MG tablet  Commonly known as:  PACERONE  Take 0.5 tablets (100 mg total) by mouth daily.     cetirizine 10 MG tablet  Commonly known as:  ZYRTEC  Take 5-10 mg by mouth daily as needed for allergies.     clonazePAM 1 MG tablet  Commonly known as:  KLONOPIN  Take 0.5-1 mg by mouth 3 (three) times daily as needed for anxiety.     diltiazem 240 MG 24 hr capsule  Commonly known as:  CARDIZEM CD  Take 1 capsule (240 mg total) by mouth daily.     diphenhydrAMINE 25 MG tablet  Commonly known as:  BENADRYL  Take 25 mg by mouth every 6 (six) hours as needed for itching or allergies.     estradiol 1 MG tablet  Commonly known as:  ESTRACE  Take 1 mg by mouth daily.     glipiZIDE 10 MG 24 hr tablet  Commonly known as:  GLUCOTROL XL  Take 10 mg by mouth daily with breakfast.     Insulin Glargine 100 UNIT/ML Solostar Pen  Commonly known as:  LANTUS  Inject 30-35 Units into the skin 2 (two) times daily. Takes 35units in the AM and 30units in the PM. 25 units if cbg less than 100 at 10 pm     lansoprazole 30 MG capsule  Commonly known as:  PREVACID  Take 30 mg by mouth as directed.     levothyroxine 137 MCG tablet  Commonly known as:  SYNTHROID, LEVOTHROID  Take 137 mcg by mouth daily before breakfast.     magnesium oxide 400 MG tablet  Commonly known as:  MAG-OX  Take 400 mg by mouth 4 (four) times daily.     metFORMIN 500 MG 24 hr tablet  Commonly known as:  GLUCOPHAGE-XR  Take 1 tablet (500 mg total) by mouth 2 (two) times daily.     metoprolol 50 MG tablet  Commonly known as:  LOPRESSOR  Take 50 mg by mouth 2 (two) times daily.     Potassium Chloride ER 20 MEQ Tbcr  Take 20 mEq by mouth 2 (two) times daily.     simethicone 125 MG chewable tablet  Commonly known as:  MYLICON  Chew  0000000 mg by mouth every 6 (six) hours as needed for flatulence.     simvastatin 20 MG tablet  Commonly known as:  ZOCOR  Take 20 mg by mouth daily at 8 pm.     torsemide 20 MG tablet  Commonly known as:  DEMADEX  Take 20 mg by mouth 2 (two) times daily. 3 tabs in the morning 2 tabs in the afternoon     VENTOLIN HFA IN  Inhale 90 mcg into the lungs every 4 (four) hours.        Followup:      Follow-up Information    Follow up with Alexis Frock, MD On 03/05/2015.   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 I have reviewed them today. I spoke with daughter on rounds again this PM and discussed to call medical docs to review meds. Pt expressed understanding. Daughter is very involved in care and named three or four medical providers. Also, nurse recommended [facility] nursing. Order placed in addition to [facility] PT.

## 2015-02-22 NOTE — Progress Notes (Signed)
PT Cancellation Note  Patient Details Name: [name] MRN: [id number] DOB: March 15, 1940   Cancelled Treatment:    Reason Eval/Treat Not Completed: Other (comment) (pt up with nsg, then nauseous, now waiting on lunch) wants to wait until tomorrow for PT; will see next day or as schedule permits   [facility] 02/22/2015, 1:10 PM

## 2015-02-22 NOTE — Plan of Care (Signed)
Problem: Activity: Goal: Risk for activity intolerance will decrease Outcome: Progressing Pt walked 100 feet around unit with no assist.

## 2015-02-23 LAB — BASIC METABOLIC PANEL
ANION GAP: 10 (ref 5–15)
BUN: 28 mg/dL — ABNORMAL HIGH (ref 6–20)
CALCIUM: 8.7 mg/dL — AB (ref 8.9–10.3)
CO2: 33 mmol/L — ABNORMAL HIGH (ref 22–32)
CREATININE: 2.51 mg/dL — AB (ref 0.44–1.00)
Chloride: 96 mmol/L — ABNORMAL LOW (ref 101–111)
GFR, EST AFRICAN AMERICAN: 21 mL/min — AB (ref >60)
GFR, EST NON AFRICAN AMERICAN: 18 mL/min — AB (ref >60)
GLUCOSE: 78 mg/dL (ref 65–99)
Potassium: 4.1 mmol/L (ref 3.5–5.1)
Sodium: 139 mmol/L (ref 135–145)

## 2015-02-23 LAB — GLUCOSE, CAPILLARY
GLUCOSE-CAPILLARY: 153 mg/dL — AB (ref 65–99)
Glucose-Capillary: 67 mg/dL (ref 65–99)
Glucose-Capillary: 78 mg/dL (ref 65–99)
Glucose-Capillary: 135 mg/dL — ABNORMAL HIGH (ref 65–99)

## 2015-02-23 LAB — HEMOGLOBIN AND HEMATOCRIT, BLOOD
HCT: 32.4 % — ABNORMAL LOW (ref 36.0–46.0)
HEMOGLOBIN: 9.6 g/dL — AB (ref 12.0–15.0)

## 2015-02-23 NOTE — Evaluation (Signed)
Physical Therapy Evaluation Patient Details Name: [name] MRN: [id number] DOB: May 10, 1940 Today's Date: 02/23/2015   History of Present Illness  75 yo female s/p radical nephroureterectomy, adhesiolysis 02/19/14.   Clinical Impression  On eval, pt was Min guard assist for mobility-walked ~140 feet with RW. Pt reports pain R side, R LE ~5/10 with activity. Distance limited by pain. Pt demonstrates general weakness, decreased activity, antalgic gait pattern, and impaired balance.Recommend HHPT follow, RW, and 3n1. Family present during session-assist at home has been arranged.     Follow Up Recommendations Home health PT;Supervision/Assistance - 24 hour    Equipment Recommendations  Rolling walker with 5" wheels; 3in1    Recommendations for Other Services       Precautions / Restrictions Precautions Precautions: Fall Restrictions Weight Bearing Restrictions: No      Mobility  Bed Mobility               General bed mobility comments: oob in recliner  Transfers Overall transfer level: Needs assistance Equipment used: Rolling walker (2 wheeled) Transfers: Sit to/from Stand Sit to Stand: Min guard         General transfer comment: close guard for safety. Increased time. VCs hand placement  Ambulation/Gait Ambulation/Gait assistance: Min guard Ambulation Distance (Feet): 140 Feet Assistive device: Rolling walker (2 wheeled) Gait Pattern/deviations: Step-to pattern;Step-through pattern;Antalgic     General Gait Details: slow gait speed. some difficulty advancing R LE due to pain.   Stairs            Wheelchair Mobility    Modified Rankin (Stroke Patients Only)       Balance Overall balance assessment: Needs assistance         Standing balance support: Bilateral upper extremity supported;During functional activity Standing balance-Leahy Scale: Poor                               Pertinent Vitals/Pain Pain Assessment:  0-10 Pain Score: 5  Pain Location: R side, R LE Pain Descriptors / Indicators: Sore Pain Intervention(s): Monitored during session    Home Living Family/patient expects to be discharged to:: Private residence Living Arrangements: Children Available Help at Discharge: Family Type of Home: House Home Access: Ramped entrance     Home Layout: One level Home Equipment: None      Prior Function Level of Independence: Independent               Hand Dominance        Extremity/Trunk Assessment   Upper Extremity Assessment: Generalized weakness           Lower Extremity Assessment: Generalized weakness      Cervical / Trunk Assessment: Normal  Communication   Communication: No difficulties  Cognition Arousal/Alertness: Awake/alert Behavior During Therapy: WFL for tasks assessed/performed Overall Cognitive Status: Within Functional Limits for tasks assessed                      General Comments      Exercises        Assessment/Plan    PT Assessment Patient needs continued PT services  PT Diagnosis Difficulty walking;Generalized weakness;Acute pain   PT Problem List Decreased strength;Decreased range of motion;Decreased activity tolerance;Decreased balance;Pain;Decreased mobility;Decreased knowledge of use of DME;Obesity  PT Treatment Interventions DME instruction;Gait training;Functional mobility training;Therapeutic activities;Patient/family education;Balance training;Therapeutic exercise   PT Goals (Current goals can be found in the Care Plan section) Acute Rehab  PT Goals Patient Stated Goal: home soon. less pain PT Goal Formulation: With patient/family Time For Goal Achievement: 03/09/15 Potential to Achieve Goals: Good    Frequency Min 3X/week   Barriers to discharge        Co-evaluation               End of Session   Activity Tolerance: Patient limited by pain Patient left: in chair;with call bell/phone within reach;with  chair alarm set;with family/visitor present           Time: [id number] PT Time Calculation (min) (ACUTE ONLY): 27 min   Charges:   PT Evaluation $PT Eval Moderate Complexity: 1 Procedure PT Treatments $Gait Training: 8-22 mins   PT G Codes:        Weston Anna, MPT Pager: [phone]

## 2015-02-23 NOTE — Progress Notes (Signed)
3 Days Post-Op Subjective: Patient reports some leakage from JP site. Labs stable. Ambulating much better today. Walking around room with walker when I saw her.   Objective: Vital signs in last 24 hours: Temp:  [98.1 F (36.7 C)-99.1 F (37.3 C)] 98.1 F (36.7 C) (01/28 0800) Pulse Rate:  [56-77] 56 (01/28 1200) Resp:  [15-27] 20 (01/28 1200) BP: (88-123)/(39-74) 96/58 mmHg (01/28 1200) SpO2:  [93 %-100 %] 99 % (01/28 1200)  Intake/Output from previous day: 01/27 0701 - 01/28 0700 In: 486 [P.O.:480] Out: -  Intake/Output this shift: Total I/O In: -  Out: 500 [Urine:500]  Physical Exam:  NAD Standing in room. Sat back down abd - inc c/d/i, good BS cv - RRR Ext - no calf pain or swelling.   Lab Results:  Recent Labs  02/21/15 0650 02/22/15 0310 02/23/15 0352  HGB 9.4* 9.8* 9.6*  HCT 31.8* 33.9* 32.4*   BMET  Recent Labs  02/22/15 0310 02/23/15 0352  NA 137 139  K 3.9 4.1  CL 93* 96*  CO2 33* 33*  GLUCOSE 138* 78  BUN 25* 28*  CREATININE 2.39* 2.51*  CALCIUM 8.4* 8.7*   No results for input(s): LABPT, INR in the last 72 hours. No results for input(s): LABURIN in the last 72 hours. Results for orders placed or performed during the hospital encounter of 02/20/15  MRSA PCR Screening     Status: None   Collection Time: 02/20/15  2:49 PM  Result Value Ref Range Status   MRSA by PCR NEGATIVE NEGATIVE Final    Comment:        The GeneXpert MRSA Assay (FDA approved for NASAL specimens only), is one component of a comprehensive MRSA colonization surveillance program. It is not intended to diagnose MRSA infection nor to guide or monitor treatment for MRSA infections.     Studies/Results: No results found.  Assessment/Plan:  Looks good - home this PM or in AM.    LOS: 3 days   Hampton Wixom 02/23/2015, 12:49 PM

## 2015-02-24 LAB — BASIC METABOLIC PANEL
ANION GAP: 12 (ref 5–15)
BUN: 28 mg/dL — ABNORMAL HIGH (ref 6–20)
CALCIUM: 8.9 mg/dL (ref 8.9–10.3)
CHLORIDE: 97 mmol/L — AB (ref 101–111)
CO2: 31 mmol/L (ref 22–32)
Creatinine, Ser: 2.54 mg/dL — ABNORMAL HIGH (ref 0.44–1.00)
GFR calc non Af Amer: 18 mL/min — ABNORMAL LOW (ref >60)
GFR, EST AFRICAN AMERICAN: 21 mL/min — AB (ref >60)
Glucose, Bld: 112 mg/dL — ABNORMAL HIGH (ref 65–99)
Potassium: 4.4 mmol/L (ref 3.5–5.1)
Sodium: 140 mmol/L (ref 135–145)

## 2015-02-24 LAB — GLUCOSE, CAPILLARY
GLUCOSE-CAPILLARY: 59 mg/dL — AB (ref 65–99)
Glucose-Capillary: 132 mg/dL — ABNORMAL HIGH (ref 65–99)
Glucose-Capillary: 75 mg/dL (ref 65–99)
Glucose-Capillary: 106 mg/dL — ABNORMAL HIGH (ref 65–99)

## 2015-02-24 NOTE — Care Management Important Message (Signed)
Important Message  Patient Details  Name: [name] MRN: [id number] Date of Birth: 1940-10-04   Medicare Important Message Given:  Yes    Erenest Rasher, RN 02/24/2015, 3:57 PM

## 2015-02-24 NOTE — Care Management Note (Addendum)
Case Management Note  Patient Details  Name: AMINAH EISAMAN MRN: [id number] Date of Birth: 01/25/1941  Subjective/Objective:         Right upper tract urothelial carcinoma, s/p RIGHT robot-assisted laparoscopic nephroureterectomy            Action/Plan:  NCM spoke to pt and dtr, Heinz Knuckles # [phone] at bedside. Offered choice and provided [facility] list. Dtr requested [facility] for [facility]. Requested RW with seat and 3n1 bedside commode for home. Contacted [facility] DME rep for equipment for home.  Dtr states she or her husband are at home to assist with her care. Pt has oxygen at home.    Expected Discharge Date:  02/24/2015              Expected Discharge Plan:  [place]  In-House Referral:  NA  Discharge planning Services  CM Consult  Post Acute Care Choice:  NA Choice offered to:  NA  DME Arranged:  Rolling Walker with seat, 3n1 bedside commode DME Agency:  [place]:  [place] Agency:  RN, Physical Therapy  Status of Service:  Completed, signed off  Medicare Important Message Given:  yes Date Medicare IM Given:    Medicare IM give by:    Date Additional Medicare IM Given:    Additional Medicare Important Message give by:     If discussed at [place] of Stay Meetings, dates discussed:    Additional Comments:  Erenest Rasher, RN 02/24/2015, 3:09 PM

## 2015-02-26 ENCOUNTER — Telehealth: Payer: Self-pay | Admitting: Cardiology

## 2015-02-26 ENCOUNTER — Telehealth (HOSPITAL_COMMUNITY): Payer: Self-pay | Admitting: *Deleted

## 2015-02-26 DIAGNOSIS — I10 Essential (primary) hypertension: Secondary | ICD-10-CM

## 2015-02-26 DIAGNOSIS — I480 Paroxysmal atrial fibrillation: Secondary | ICD-10-CM

## 2015-02-26 NOTE — Telephone Encounter (Signed)
Pt daughter called in concerned regarding supposedly needing some doses of medications adjusted now that she only has one kidney and renal function has changed. According to discharge notes Dr. Tammi Klippel recommended this take place. Daughter is concerned regarding increased swelling in lower extremities and weight has increased from 229 to 232 over night. Patient did not take torsemide yesterday because unsure of dosing.  Patient is due to restart xarelto tomorrow per discharge orders but with decreased renal function dosing may need to be reduced on this as well. Encouraged daughter, Tomi Bamberger, to call Dr. Doug Sou office to have all medications reviewed to ensure correct dosing with new renal function values since surgery. Daughter verbalized understanding.

## 2015-02-26 NOTE — Telephone Encounter (Signed)
Received call from patient's daughter [name].She stated mother had right kidney removed 02/20/15.Stated she was told to ask Dr.Jordan since she now has 1 kidney he might need to reduce Torsemide and Xarelto dose.She has post hospital appointment with Ignacia Bayley PA 03/12/14.She also wants to know if mother needs lab work before appointment.Message sent to [place] for advice.

## 2015-02-26 NOTE — Telephone Encounter (Signed)
Returned call to patient's daughter [name] no answer.[place].

## 2015-02-26 NOTE — Telephone Encounter (Signed)
New message     Pt was recently discharged from the hosp on Sunday.  She had a kidney removed.  Her kidney doctor want pt to call and have her torsormide and xarelto adjusted because of the 1 kidney.  Home health took pt's bp today and it was 120/64 and her weight was 232.6 lbs. Please advise regarding medication changes

## 2015-02-27 ENCOUNTER — Telehealth: Payer: Self-pay | Admitting: Cardiology

## 2015-02-27 MED ORDER — RIVAROXABAN 15 MG PO TABS: 15.0000 mg | ORAL_TABLET | Freq: Every day | ORAL | Status: DC

## 2015-02-27 NOTE — Telephone Encounter (Signed)
Returned call to patient's daughter [name].Advised patient needs to stop metformin,need to call PCP for insulin adjustment.

## 2015-02-27 NOTE — Addendum Note (Signed)
Addended by: Kathyrn Lass on: 02/27/2015 09:48 AM   Modules accepted: Orders

## 2015-02-27 NOTE — Telephone Encounter (Signed)
Returned call to patient's daughter [name].Dr.Jordan's recommendations given.Lab order mailed.

## 2015-02-27 NOTE — Telephone Encounter (Signed)
She should reduce Xarelto to 15 mg daily. Needs CBC and BMET checked on follow up. Will review diuretic dosing on follow up. With renal insufficiency she should also not be on metformin. Primary care may need to adjust insulin.   Marsela Kuan [name] MD, [facility]

## 2015-02-27 NOTE — Telephone Encounter (Signed)
New message      Daughter has another question to ask the nurse.  Please call.  She just talked to her this am

## 2015-03-13 ENCOUNTER — Encounter: Payer: Self-pay | Admitting: Nurse Practitioner

## 2015-03-13 ENCOUNTER — Ambulatory Visit: Payer: Medicare HMO | Admitting: Nurse Practitioner

## 2015-03-13 DIAGNOSIS — N183 Chronic kidney disease, stage 3 unspecified: Secondary | ICD-10-CM | POA: Insufficient documentation

## 2015-03-13 DIAGNOSIS — E669 Obesity, unspecified: Secondary | ICD-10-CM | POA: Insufficient documentation

## 2015-03-13 DIAGNOSIS — C651 Malignant neoplasm of right renal pelvis: Secondary | ICD-10-CM | POA: Insufficient documentation

## 2015-03-13 DIAGNOSIS — I119 Hypertensive heart disease without heart failure: Secondary | ICD-10-CM | POA: Insufficient documentation

## 2015-03-15 ENCOUNTER — Encounter: Payer: Self-pay | Admitting: Cardiology

## 2015-03-15 ENCOUNTER — Ambulatory Visit (INDEPENDENT_AMBULATORY_CARE_PROVIDER_SITE_OTHER): Payer: Medicare HMO | Admitting: *Deleted

## 2015-03-15 DIAGNOSIS — I480 Paroxysmal atrial fibrillation: Secondary | ICD-10-CM

## 2015-03-15 NOTE — Progress Notes (Signed)
Carelink Summary Report / Loop Recorder 

## 2015-03-21 ENCOUNTER — Encounter (HOSPITAL_COMMUNITY): Payer: Self-pay | Admitting: Nurse Practitioner

## 2015-03-21 ENCOUNTER — Ambulatory Visit (HOSPITAL_COMMUNITY)
Admission: RE | Admit: 2015-03-21 | Discharge: 2015-03-21 | Disposition: A | Discharge status: Home / Self Care | Payer: Medicare HMO | Source: Ambulatory Visit | Attending: Nurse Practitioner | Admitting: Nurse Practitioner

## 2015-03-21 VITALS — BP 96/64 | HR 114 | Ht 62.0 in | Wt 217.6 lb

## 2015-03-21 DIAGNOSIS — I480 Paroxysmal atrial fibrillation: Secondary | ICD-10-CM

## 2015-03-21 DIAGNOSIS — G4730 Sleep apnea, unspecified: Secondary | ICD-10-CM | POA: Insufficient documentation

## 2015-03-21 DIAGNOSIS — Z7902 Long term (current) use of antithrombotics/antiplatelets: Secondary | ICD-10-CM | POA: Diagnosis not present

## 2015-03-21 DIAGNOSIS — C651 Malignant neoplasm of right renal pelvis: Secondary | ICD-10-CM | POA: Diagnosis not present

## 2015-03-21 DIAGNOSIS — Z9981 Dependence on supplemental oxygen: Secondary | ICD-10-CM | POA: Diagnosis not present

## 2015-03-21 DIAGNOSIS — K219 Gastro-esophageal reflux disease without esophagitis: Secondary | ICD-10-CM | POA: Diagnosis not present

## 2015-03-21 DIAGNOSIS — J45909 Unspecified asthma, uncomplicated: Secondary | ICD-10-CM | POA: Insufficient documentation

## 2015-03-21 DIAGNOSIS — E039 Hypothyroidism, unspecified: Secondary | ICD-10-CM | POA: Insufficient documentation

## 2015-03-21 DIAGNOSIS — Z905 Acquired absence of kidney: Secondary | ICD-10-CM | POA: Insufficient documentation

## 2015-03-21 DIAGNOSIS — I1310 Hypertensive heart and chronic kidney disease without heart failure, with stage 1 through stage 4 chronic kidney disease, or unspecified chronic kidney disease: Secondary | ICD-10-CM | POA: Insufficient documentation

## 2015-03-21 DIAGNOSIS — Z794 Long term (current) use of insulin: Secondary | ICD-10-CM | POA: Diagnosis not present

## 2015-03-21 DIAGNOSIS — Z6839 Body mass index (BMI) 39.0-39.9, adult: Secondary | ICD-10-CM | POA: Insufficient documentation

## 2015-03-21 DIAGNOSIS — E559 Vitamin D deficiency, unspecified: Secondary | ICD-10-CM | POA: Diagnosis not present

## 2015-03-21 DIAGNOSIS — K3184 Gastroparesis: Secondary | ICD-10-CM | POA: Insufficient documentation

## 2015-03-21 DIAGNOSIS — I429 Cardiomyopathy, unspecified: Secondary | ICD-10-CM | POA: Insufficient documentation

## 2015-03-21 DIAGNOSIS — N183 Chronic kidney disease, stage 3 (moderate): Secondary | ICD-10-CM | POA: Insufficient documentation

## 2015-03-21 DIAGNOSIS — E669 Obesity, unspecified: Secondary | ICD-10-CM | POA: Diagnosis not present

## 2015-03-21 DIAGNOSIS — I4891 Unspecified atrial fibrillation: Secondary | ICD-10-CM | POA: Diagnosis present

## 2015-03-21 DIAGNOSIS — E1122 Type 2 diabetes mellitus with diabetic chronic kidney disease: Secondary | ICD-10-CM | POA: Insufficient documentation

## 2015-03-21 DIAGNOSIS — I4892 Unspecified atrial flutter: Secondary | ICD-10-CM | POA: Insufficient documentation

## 2015-03-21 DIAGNOSIS — E1140 Type 2 diabetes mellitus with diabetic neuropathy, unspecified: Secondary | ICD-10-CM | POA: Diagnosis not present

## 2015-03-21 DIAGNOSIS — E785 Hyperlipidemia, unspecified: Secondary | ICD-10-CM | POA: Diagnosis not present

## 2015-03-21 DIAGNOSIS — I484 Atypical atrial flutter: Secondary | ICD-10-CM

## 2015-03-21 DIAGNOSIS — E1143 Type 2 diabetes mellitus with diabetic autonomic (poly)neuropathy: Secondary | ICD-10-CM | POA: Insufficient documentation

## 2015-03-21 DIAGNOSIS — Z79899 Other long term (current) drug therapy: Secondary | ICD-10-CM | POA: Diagnosis not present

## 2015-03-21 MED ORDER — AMIODARONE HCL 200 MG PO TABS: 200.0000 mg | ORAL_TABLET | Freq: Every day | ORAL | Status: DC

## 2015-03-21 NOTE — Progress Notes (Signed)
Patients daughter called from urologist office concerned because when they checked her HR it was higher than normal at 110. Stated she has had two "spells" over last day and had activated her loop recorder to record. Does not know what's going on and wants to be checked. Will bring in for EKG. Roderic Palau NP to review EKG.

## 2015-03-21 NOTE — Patient Instructions (Signed)
Increase Amiodarone to 200mg  once a day
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
## 2015-03-22 ENCOUNTER — Encounter: Payer: Self-pay | Admitting: Internal Medicine

## 2015-03-26 ENCOUNTER — Ambulatory Visit (HOSPITAL_COMMUNITY)
Admission: RE | Admit: 2015-03-26 | Discharge: 2015-03-26 | Disposition: A | Discharge status: Home / Self Care | Payer: Medicare HMO | Source: Ambulatory Visit | Attending: Nurse Practitioner | Admitting: Nurse Practitioner

## 2015-03-26 ENCOUNTER — Encounter (HOSPITAL_COMMUNITY): Payer: Self-pay | Admitting: Nurse Practitioner

## 2015-03-26 VITALS — BP 108/76 | HR 116 | Ht 63.0 in | Wt 215.4 lb

## 2015-03-26 DIAGNOSIS — I471 Supraventricular tachycardia: Secondary | ICD-10-CM

## 2015-03-26 DIAGNOSIS — Z833 Family history of diabetes mellitus: Secondary | ICD-10-CM | POA: Insufficient documentation

## 2015-03-26 DIAGNOSIS — Z9981 Dependence on supplemental oxygen: Secondary | ICD-10-CM | POA: Diagnosis not present

## 2015-03-26 DIAGNOSIS — Z905 Acquired absence of kidney: Secondary | ICD-10-CM | POA: Insufficient documentation

## 2015-03-26 DIAGNOSIS — Z7902 Long term (current) use of antithrombotics/antiplatelets: Secondary | ICD-10-CM | POA: Insufficient documentation

## 2015-03-26 DIAGNOSIS — J45909 Unspecified asthma, uncomplicated: Secondary | ICD-10-CM | POA: Diagnosis not present

## 2015-03-26 DIAGNOSIS — E1140 Type 2 diabetes mellitus with diabetic neuropathy, unspecified: Secondary | ICD-10-CM | POA: Diagnosis not present

## 2015-03-26 DIAGNOSIS — E785 Hyperlipidemia, unspecified: Secondary | ICD-10-CM | POA: Insufficient documentation

## 2015-03-26 DIAGNOSIS — Z880 Allergy status to penicillin: Secondary | ICD-10-CM | POA: Insufficient documentation

## 2015-03-26 DIAGNOSIS — Z888 Allergy status to other drugs, medicaments and biological substances status: Secondary | ICD-10-CM | POA: Insufficient documentation

## 2015-03-26 DIAGNOSIS — E039 Hypothyroidism, unspecified: Secondary | ICD-10-CM | POA: Insufficient documentation

## 2015-03-26 DIAGNOSIS — Z8553 Personal history of malignant neoplasm of renal pelvis: Secondary | ICD-10-CM | POA: Insufficient documentation

## 2015-03-26 DIAGNOSIS — Z9889 Other specified postprocedural states: Secondary | ICD-10-CM | POA: Insufficient documentation

## 2015-03-26 DIAGNOSIS — E669 Obesity, unspecified: Secondary | ICD-10-CM | POA: Diagnosis not present

## 2015-03-26 DIAGNOSIS — Z885 Allergy status to narcotic agent status: Secondary | ICD-10-CM | POA: Insufficient documentation

## 2015-03-26 DIAGNOSIS — Z79899 Other long term (current) drug therapy: Secondary | ICD-10-CM | POA: Diagnosis not present

## 2015-03-26 DIAGNOSIS — Z794 Long term (current) use of insulin: Secondary | ICD-10-CM | POA: Diagnosis not present

## 2015-03-26 DIAGNOSIS — I428 Other cardiomyopathies: Secondary | ICD-10-CM | POA: Diagnosis not present

## 2015-03-26 DIAGNOSIS — N183 Chronic kidney disease, stage 3 (moderate): Secondary | ICD-10-CM | POA: Insufficient documentation

## 2015-03-26 DIAGNOSIS — Z8249 Family history of ischemic heart disease and other diseases of the circulatory system: Secondary | ICD-10-CM | POA: Insufficient documentation

## 2015-03-26 DIAGNOSIS — Z91040 Latex allergy status: Secondary | ICD-10-CM | POA: Diagnosis not present

## 2015-03-26 DIAGNOSIS — Z881 Allergy status to other antibiotic agents status: Secondary | ICD-10-CM | POA: Diagnosis not present

## 2015-03-26 DIAGNOSIS — R000 Tachycardia, unspecified: Secondary | ICD-10-CM | POA: Diagnosis present

## 2015-03-26 DIAGNOSIS — K219 Gastro-esophageal reflux disease without esophagitis: Secondary | ICD-10-CM | POA: Insufficient documentation

## 2015-03-26 DIAGNOSIS — Z6838 Body mass index (BMI) 38.0-38.9, adult: Secondary | ICD-10-CM | POA: Insufficient documentation

## 2015-03-26 DIAGNOSIS — E1122 Type 2 diabetes mellitus with diabetic chronic kidney disease: Secondary | ICD-10-CM | POA: Diagnosis not present
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
## 2015-03-28 ENCOUNTER — Telehealth (HOSPITAL_COMMUNITY): Payer: Self-pay | Admitting: *Deleted

## 2015-03-28 DIAGNOSIS — I480 Paroxysmal atrial fibrillation: Secondary | ICD-10-CM

## 2015-03-28 MED ORDER — AMIODARONE HCL 200 MG PO TABS: 200.0000 mg | ORAL_TABLET | Freq: Two times a day (BID) | ORAL | Status: DC

## 2015-03-28 NOTE — Telephone Encounter (Signed)
Allison Palau NP discussed EKG with Dr. Rayann Heman  -- felt it was an Atypical atrial flutter - recommended increasing Amiodarone to 200mg  twice a day and will recheck in 2 weeks. If still in A-Flutter upon reck will set up for DCCV. Patient verbalized understanding of instructions and follow up appt made.

## 2015-04-02 ENCOUNTER — Ambulatory Visit (INDEPENDENT_AMBULATORY_CARE_PROVIDER_SITE_OTHER): Payer: Medicare HMO | Admitting: Cardiology

## 2015-04-02 ENCOUNTER — Encounter: Payer: Self-pay | Admitting: Cardiology

## 2015-04-02 VITALS — BP 100/64 | HR 60 | Ht 63.0 in | Wt 214.7 lb

## 2015-04-02 DIAGNOSIS — I4819 Other persistent atrial fibrillation: Secondary | ICD-10-CM

## 2015-04-02 DIAGNOSIS — I481 Persistent atrial fibrillation: Secondary | ICD-10-CM

## 2015-04-02 DIAGNOSIS — I484 Atypical atrial flutter: Secondary | ICD-10-CM | POA: Diagnosis not present

## 2015-04-02 DIAGNOSIS — I2510 Atherosclerotic heart disease of native coronary artery without angina pectoris: Secondary | ICD-10-CM

## 2015-04-02 DIAGNOSIS — I5032 Chronic diastolic (congestive) heart failure: Secondary | ICD-10-CM

## 2015-04-02 DIAGNOSIS — I10 Essential (primary) hypertension: Secondary | ICD-10-CM

## 2015-04-02 NOTE — Patient Instructions (Signed)
Continue your current therapy  We will request your lab work from Dr. Tammi Klippel  I will see you in 4 months

## 2015-04-03 LAB — CUP PACEART REMOTE DEVICE CHECK: Date Time Interrogation Session: 20170217083531
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
## 2015-04-03 NOTE — Progress Notes (Signed)
Carelink summary report received. Battery status OK. Normal device function. No new symptom episodes, tachy episodes, brady, or pause episodes. No new AF episodes. Monthly summary reports and ROV/PRN 

## 2015-04-05 LAB — CUP PACEART REMOTE DEVICE CHECK: Date Time Interrogation Session: 20170118080650

## 2015-04-11 ENCOUNTER — Ambulatory Visit (HOSPITAL_COMMUNITY)
Admission: RE | Admit: 2015-04-11 | Discharge: 2015-04-11 | Disposition: A | Discharge status: Home / Self Care | Payer: Medicare HMO | Source: Ambulatory Visit | Attending: Nurse Practitioner | Admitting: Nurse Practitioner

## 2015-04-11 ENCOUNTER — Encounter (HOSPITAL_COMMUNITY): Payer: Self-pay | Admitting: Nurse Practitioner

## 2015-04-11 ENCOUNTER — Other Ambulatory Visit: Payer: Self-pay

## 2015-04-11 VITALS — BP 88/56 | HR 81 | Ht 63.0 in | Wt 214.4 lb

## 2015-04-11 DIAGNOSIS — Z800 Family history of malignant neoplasm of digestive organs: Secondary | ICD-10-CM | POA: Diagnosis not present

## 2015-04-11 DIAGNOSIS — Z888 Allergy status to other drugs, medicaments and biological substances status: Secondary | ICD-10-CM | POA: Diagnosis not present

## 2015-04-11 DIAGNOSIS — I4891 Unspecified atrial fibrillation: Secondary | ICD-10-CM | POA: Insufficient documentation

## 2015-04-11 DIAGNOSIS — Z9981 Dependence on supplemental oxygen: Secondary | ICD-10-CM | POA: Diagnosis not present

## 2015-04-11 DIAGNOSIS — Z794 Long term (current) use of insulin: Secondary | ICD-10-CM | POA: Insufficient documentation

## 2015-04-11 DIAGNOSIS — Z8249 Family history of ischemic heart disease and other diseases of the circulatory system: Secondary | ICD-10-CM | POA: Insufficient documentation

## 2015-04-11 DIAGNOSIS — Z7901 Long term (current) use of anticoagulants: Secondary | ICD-10-CM | POA: Insufficient documentation

## 2015-04-11 DIAGNOSIS — Z8041 Family history of malignant neoplasm of ovary: Secondary | ICD-10-CM | POA: Insufficient documentation

## 2015-04-11 DIAGNOSIS — I428 Other cardiomyopathies: Secondary | ICD-10-CM | POA: Insufficient documentation

## 2015-04-11 DIAGNOSIS — I4819 Other persistent atrial fibrillation: Secondary | ICD-10-CM

## 2015-04-11 DIAGNOSIS — N183 Chronic kidney disease, stage 3 (moderate): Secondary | ICD-10-CM | POA: Insufficient documentation

## 2015-04-11 DIAGNOSIS — Z803 Family history of malignant neoplasm of breast: Secondary | ICD-10-CM | POA: Insufficient documentation

## 2015-04-11 DIAGNOSIS — E669 Obesity, unspecified: Secondary | ICD-10-CM | POA: Diagnosis not present

## 2015-04-11 DIAGNOSIS — K219 Gastro-esophageal reflux disease without esophagitis: Secondary | ICD-10-CM | POA: Diagnosis not present

## 2015-04-11 DIAGNOSIS — Z6837 Body mass index (BMI) 37.0-37.9, adult: Secondary | ICD-10-CM | POA: Insufficient documentation

## 2015-04-11 DIAGNOSIS — E039 Hypothyroidism, unspecified: Secondary | ICD-10-CM | POA: Insufficient documentation

## 2015-04-11 DIAGNOSIS — Z881 Allergy status to other antibiotic agents status: Secondary | ICD-10-CM | POA: Insufficient documentation

## 2015-04-11 DIAGNOSIS — Z91040 Latex allergy status: Secondary | ICD-10-CM | POA: Insufficient documentation

## 2015-04-11 DIAGNOSIS — E1122 Type 2 diabetes mellitus with diabetic chronic kidney disease: Secondary | ICD-10-CM | POA: Diagnosis not present

## 2015-04-11 DIAGNOSIS — Z833 Family history of diabetes mellitus: Secondary | ICD-10-CM | POA: Diagnosis not present

## 2015-04-11 DIAGNOSIS — I481 Persistent atrial fibrillation: Secondary | ICD-10-CM

## 2015-04-11 DIAGNOSIS — E559 Vitamin D deficiency, unspecified: Secondary | ICD-10-CM | POA: Diagnosis not present

## 2015-04-11 DIAGNOSIS — Z8051 Family history of malignant neoplasm of kidney: Secondary | ICD-10-CM | POA: Insufficient documentation

## 2015-04-11 DIAGNOSIS — I1310 Hypertensive heart and chronic kidney disease without heart failure, with stage 1 through stage 4 chronic kidney disease, or unspecified chronic kidney disease: Secondary | ICD-10-CM | POA: Insufficient documentation

## 2015-04-11 DIAGNOSIS — Z880 Allergy status to penicillin: Secondary | ICD-10-CM | POA: Diagnosis not present

## 2015-04-11 DIAGNOSIS — Z885 Allergy status to narcotic agent status: Secondary | ICD-10-CM | POA: Diagnosis not present

## 2015-04-11 DIAGNOSIS — Z905 Acquired absence of kidney: Secondary | ICD-10-CM | POA: Insufficient documentation

## 2015-04-11 DIAGNOSIS — J45909 Unspecified asthma, uncomplicated: Secondary | ICD-10-CM | POA: Insufficient documentation

## 2015-04-11 DIAGNOSIS — E1140 Type 2 diabetes mellitus with diabetic neuropathy, unspecified: Secondary | ICD-10-CM | POA: Diagnosis not present

## 2015-04-11 DIAGNOSIS — M109 Gout, unspecified: Secondary | ICD-10-CM | POA: Insufficient documentation

## 2015-04-11 DIAGNOSIS — E785 Hyperlipidemia, unspecified: Secondary | ICD-10-CM | POA: Insufficient documentation

## 2015-04-11 DIAGNOSIS — Z79899 Other long term (current) drug therapy: Secondary | ICD-10-CM | POA: Insufficient documentation
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
## 2015-04-11 NOTE — Patient Instructions (Signed)
Your physician has recommended you make the following change in your medication:  1)Decrease Metoprolol to 25mg  (1/2 tablet) twice a day

## 2015-04-15 ENCOUNTER — Ambulatory Visit (INDEPENDENT_AMBULATORY_CARE_PROVIDER_SITE_OTHER): Payer: Medicare HMO | Admitting: *Deleted

## 2015-04-15 DIAGNOSIS — I480 Paroxysmal atrial fibrillation: Secondary | ICD-10-CM | POA: Diagnosis not present

## 2015-04-16 NOTE — Progress Notes (Signed)
Carelink Summary Report / Loop Recorder 

## 2015-04-23 ENCOUNTER — Telehealth: Payer: Self-pay | Admitting: Cardiology

## 2015-04-23 NOTE — Telephone Encounter (Signed)
Spoke w/ pt and requested that she send a manual transmission w/ her home monitor because it has not updated since 03-28-2015. Pt verbalized understanding.

## 2015-04-25 ENCOUNTER — Ambulatory Visit (HOSPITAL_COMMUNITY)
Admission: RE | Admit: 2015-04-25 | Discharge: 2015-04-25 | Disposition: A | Discharge status: Home / Self Care | Payer: Medicare HMO | Source: Ambulatory Visit | Attending: Nurse Practitioner | Admitting: Nurse Practitioner

## 2015-04-25 ENCOUNTER — Encounter (HOSPITAL_COMMUNITY): Payer: Self-pay | Admitting: Nurse Practitioner

## 2015-04-25 VITALS — BP 118/70 | HR 57 | Ht 63.0 in | Wt 209.4 lb

## 2015-04-25 DIAGNOSIS — G510 Bell's palsy: Secondary | ICD-10-CM | POA: Insufficient documentation

## 2015-04-25 DIAGNOSIS — I130 Hypertensive heart and chronic kidney disease with heart failure and stage 1 through stage 4 chronic kidney disease, or unspecified chronic kidney disease: Secondary | ICD-10-CM | POA: Diagnosis not present

## 2015-04-25 DIAGNOSIS — I429 Cardiomyopathy, unspecified: Secondary | ICD-10-CM | POA: Insufficient documentation

## 2015-04-25 DIAGNOSIS — F419 Anxiety disorder, unspecified: Secondary | ICD-10-CM | POA: Insufficient documentation

## 2015-04-25 DIAGNOSIS — M1990 Unspecified osteoarthritis, unspecified site: Secondary | ICD-10-CM | POA: Diagnosis not present

## 2015-04-25 DIAGNOSIS — Z905 Acquired absence of kidney: Secondary | ICD-10-CM | POA: Diagnosis not present

## 2015-04-25 DIAGNOSIS — F329 Major depressive disorder, single episode, unspecified: Secondary | ICD-10-CM | POA: Insufficient documentation

## 2015-04-25 DIAGNOSIS — E039 Hypothyroidism, unspecified: Secondary | ICD-10-CM | POA: Insufficient documentation

## 2015-04-25 DIAGNOSIS — K219 Gastro-esophageal reflux disease without esophagitis: Secondary | ICD-10-CM | POA: Diagnosis not present

## 2015-04-25 DIAGNOSIS — Z8589 Personal history of malignant neoplasm of other organs and systems: Secondary | ICD-10-CM | POA: Insufficient documentation

## 2015-04-25 DIAGNOSIS — G4730 Sleep apnea, unspecified: Secondary | ICD-10-CM | POA: Insufficient documentation

## 2015-04-25 DIAGNOSIS — I4892 Unspecified atrial flutter: Secondary | ICD-10-CM | POA: Insufficient documentation

## 2015-04-25 DIAGNOSIS — K449 Diaphragmatic hernia without obstruction or gangrene: Secondary | ICD-10-CM | POA: Diagnosis not present

## 2015-04-25 DIAGNOSIS — K5792 Diverticulitis of intestine, part unspecified, without perforation or abscess without bleeding: Secondary | ICD-10-CM | POA: Insufficient documentation

## 2015-04-25 DIAGNOSIS — E1122 Type 2 diabetes mellitus with diabetic chronic kidney disease: Secondary | ICD-10-CM | POA: Insufficient documentation

## 2015-04-25 DIAGNOSIS — G629 Polyneuropathy, unspecified: Secondary | ICD-10-CM | POA: Diagnosis not present

## 2015-04-25 DIAGNOSIS — N183 Chronic kidney disease, stage 3 (moderate): Secondary | ICD-10-CM | POA: Insufficient documentation

## 2015-04-25 DIAGNOSIS — M5030 Other cervical disc degeneration, unspecified cervical region: Secondary | ICD-10-CM | POA: Diagnosis not present

## 2015-04-25 DIAGNOSIS — Z794 Long term (current) use of insulin: Secondary | ICD-10-CM | POA: Insufficient documentation

## 2015-04-25 DIAGNOSIS — E559 Vitamin D deficiency, unspecified: Secondary | ICD-10-CM | POA: Insufficient documentation

## 2015-04-25 DIAGNOSIS — I480 Paroxysmal atrial fibrillation: Secondary | ICD-10-CM | POA: Diagnosis present

## 2015-04-25 DIAGNOSIS — I481 Persistent atrial fibrillation: Secondary | ICD-10-CM

## 2015-04-25 DIAGNOSIS — I2510 Atherosclerotic heart disease of native coronary artery without angina pectoris: Secondary | ICD-10-CM | POA: Diagnosis not present

## 2015-04-25 DIAGNOSIS — Z7901 Long term (current) use of anticoagulants: Secondary | ICD-10-CM | POA: Insufficient documentation

## 2015-04-25 DIAGNOSIS — K589 Irritable bowel syndrome without diarrhea: Secondary | ICD-10-CM | POA: Insufficient documentation

## 2015-04-25 DIAGNOSIS — I5032 Chronic diastolic (congestive) heart failure: Secondary | ICD-10-CM | POA: Diagnosis not present

## 2015-04-25 DIAGNOSIS — J45909 Unspecified asthma, uncomplicated: Secondary | ICD-10-CM | POA: Diagnosis not present

## 2015-04-25 DIAGNOSIS — K648 Other hemorrhoids: Secondary | ICD-10-CM | POA: Diagnosis not present

## 2015-04-25 DIAGNOSIS — E669 Obesity, unspecified: Secondary | ICD-10-CM | POA: Diagnosis not present

## 2015-04-25 DIAGNOSIS — D126 Benign neoplasm of colon, unspecified: Secondary | ICD-10-CM | POA: Insufficient documentation

## 2015-04-25 DIAGNOSIS — K3184 Gastroparesis: Secondary | ICD-10-CM | POA: Diagnosis not present

## 2015-04-25 DIAGNOSIS — E785 Hyperlipidemia, unspecified: Secondary | ICD-10-CM | POA: Diagnosis not present

## 2015-04-25 DIAGNOSIS — I4819 Other persistent atrial fibrillation: Secondary | ICD-10-CM

## 2015-04-25 DIAGNOSIS — M109 Gout, unspecified: Secondary | ICD-10-CM | POA: Diagnosis not present

## 2015-04-25 DIAGNOSIS — Z9981 Dependence on supplemental oxygen: Secondary | ICD-10-CM | POA: Insufficient documentation

## 2015-04-25 MED ORDER — AMIODARONE HCL 200 MG PO TABS: 100.0000 mg | ORAL_TABLET | Freq: Every day | ORAL | Status: DC
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
## 2015-04-25 NOTE — Patient Instructions (Signed)
Your physician has recommended you make the following change in your medication:  1)Decrease Amiodarone to 100mg  (1/2 tablet) once a day 2)Decrease Metoprolol to 25mg  (1/2 tablet) twice a day

## 2015-05-01 ENCOUNTER — Ambulatory Visit (HOSPITAL_COMMUNITY)
Admission: RE | Admit: 2015-05-01 | Discharge: 2015-05-01 | Disposition: A | Discharge status: Home / Self Care | Payer: Medicare HMO | Source: Ambulatory Visit | Attending: Nurse Practitioner | Admitting: Nurse Practitioner

## 2015-05-01 DIAGNOSIS — I440 Atrioventricular block, first degree: Secondary | ICD-10-CM | POA: Diagnosis not present

## 2015-05-01 DIAGNOSIS — I4891 Unspecified atrial fibrillation: Secondary | ICD-10-CM | POA: Insufficient documentation

## 2015-05-01 DIAGNOSIS — Z79899 Other long term (current) drug therapy: Secondary | ICD-10-CM | POA: Diagnosis not present

## 2015-05-01 DIAGNOSIS — I480 Paroxysmal atrial fibrillation: Secondary | ICD-10-CM

## 2015-05-01 DIAGNOSIS — R001 Bradycardia, unspecified: Secondary | ICD-10-CM | POA: Insufficient documentation

## 2015-05-01 NOTE — Progress Notes (Addendum)
Pt here for EKG only after reduction of metoprolol and amiodarone -- Roderic Palau NP to review EKG  EKG shows sinus brady with first degree av block at 57 bpm, pr int 238 ms, qrs int 84 bpm, qtc 416 ms. F/u in one month. No changes in meds.

## 2015-05-02 ENCOUNTER — Inpatient Hospital Stay (HOSPITAL_COMMUNITY): Admission: RE | Admit: 2015-05-02 | Payer: Medicare HMO | Source: Ambulatory Visit | Admitting: Nurse Practitioner

## 2015-05-14 ENCOUNTER — Ambulatory Visit (INDEPENDENT_AMBULATORY_CARE_PROVIDER_SITE_OTHER): Payer: Medicare HMO | Admitting: *Deleted

## 2015-05-14 DIAGNOSIS — I480 Paroxysmal atrial fibrillation: Secondary | ICD-10-CM

## 2015-05-14 NOTE — Progress Notes (Signed)
Carelink Summary Report / Loop Recorder 

## 2015-05-21 ENCOUNTER — Other Ambulatory Visit (HOSPITAL_COMMUNITY): Payer: Self-pay | Admitting: *Deleted

## 2015-05-22 ENCOUNTER — Ambulatory Visit (HOSPITAL_COMMUNITY)
Admission: RE | Admit: 2015-05-22 | Discharge: 2015-05-22 | Disposition: A | Discharge status: Home / Self Care | Payer: Medicare HMO | Source: Ambulatory Visit | Attending: Nephrology | Admitting: Nephrology

## 2015-05-22 DIAGNOSIS — D509 Iron deficiency anemia, unspecified: Secondary | ICD-10-CM | POA: Diagnosis present

## 2015-05-22 MED ORDER — SODIUM CHLORIDE 0.9 % IV SOLN
510.0000 mg | Freq: Once | INTRAVENOUS | Status: AC
  Administered 2015-05-22: 510 mg via INTRAVENOUS
  Filled 2015-05-22: qty 17

## 2015-05-24 ENCOUNTER — Encounter (HOSPITAL_COMMUNITY): Payer: Self-pay | Admitting: *Deleted

## 2015-05-29 ENCOUNTER — Emergency Department (HOSPITAL_COMMUNITY): Payer: Medicare HMO

## 2015-05-29 ENCOUNTER — Encounter (HOSPITAL_COMMUNITY): Payer: Self-pay

## 2015-05-29 ENCOUNTER — Inpatient Hospital Stay (HOSPITAL_COMMUNITY)
Admission: EM | Admit: 2015-05-29 | Discharge: 2015-06-02 | DRG: 603 | Disposition: A | Discharge status: Home / Self Care | Payer: Medicare HMO | Attending: Internal Medicine | Admitting: Internal Medicine

## 2015-05-29 DIAGNOSIS — G4730 Sleep apnea, unspecified: Secondary | ICD-10-CM | POA: Diagnosis present

## 2015-05-29 DIAGNOSIS — Z9049 Acquired absence of other specified parts of digestive tract: Secondary | ICD-10-CM

## 2015-05-29 DIAGNOSIS — Z800 Family history of malignant neoplasm of digestive organs: Secondary | ICD-10-CM

## 2015-05-29 DIAGNOSIS — N184 Chronic kidney disease, stage 4 (severe): Secondary | ICD-10-CM | POA: Diagnosis present

## 2015-05-29 DIAGNOSIS — Z880 Allergy status to penicillin: Secondary | ICD-10-CM

## 2015-05-29 DIAGNOSIS — Z803 Family history of malignant neoplasm of breast: Secondary | ICD-10-CM

## 2015-05-29 DIAGNOSIS — Z833 Family history of diabetes mellitus: Secondary | ICD-10-CM

## 2015-05-29 DIAGNOSIS — K3184 Gastroparesis: Secondary | ICD-10-CM | POA: Diagnosis present

## 2015-05-29 DIAGNOSIS — E559 Vitamin D deficiency, unspecified: Secondary | ICD-10-CM | POA: Diagnosis present

## 2015-05-29 DIAGNOSIS — Z794 Long term (current) use of insulin: Secondary | ICD-10-CM

## 2015-05-29 DIAGNOSIS — Z9109 Other allergy status, other than to drugs and biological substances: Secondary | ICD-10-CM

## 2015-05-29 DIAGNOSIS — L03311 Cellulitis of abdominal wall: Secondary | ICD-10-CM | POA: Diagnosis present

## 2015-05-29 DIAGNOSIS — I480 Paroxysmal atrial fibrillation: Secondary | ICD-10-CM | POA: Diagnosis present

## 2015-05-29 DIAGNOSIS — D649 Anemia, unspecified: Secondary | ICD-10-CM | POA: Diagnosis present

## 2015-05-29 DIAGNOSIS — Z8051 Family history of malignant neoplasm of kidney: Secondary | ICD-10-CM

## 2015-05-29 DIAGNOSIS — F419 Anxiety disorder, unspecified: Secondary | ICD-10-CM | POA: Diagnosis present

## 2015-05-29 DIAGNOSIS — E039 Hypothyroidism, unspecified: Secondary | ICD-10-CM | POA: Diagnosis present

## 2015-05-29 DIAGNOSIS — C651 Malignant neoplasm of right renal pelvis: Secondary | ICD-10-CM | POA: Diagnosis present

## 2015-05-29 DIAGNOSIS — Z90710 Acquired absence of both cervix and uterus: Secondary | ICD-10-CM

## 2015-05-29 DIAGNOSIS — E1129 Type 2 diabetes mellitus with other diabetic kidney complication: Secondary | ICD-10-CM | POA: Diagnosis present

## 2015-05-29 DIAGNOSIS — I4819 Other persistent atrial fibrillation: Secondary | ICD-10-CM | POA: Diagnosis present

## 2015-05-29 DIAGNOSIS — I10 Essential (primary) hypertension: Secondary | ICD-10-CM | POA: Diagnosis present

## 2015-05-29 DIAGNOSIS — I481 Persistent atrial fibrillation: Secondary | ICD-10-CM | POA: Diagnosis present

## 2015-05-29 DIAGNOSIS — Z6841 Body Mass Index (BMI) 40.0 and over, adult: Secondary | ICD-10-CM

## 2015-05-29 DIAGNOSIS — N183 Chronic kidney disease, stage 3 unspecified: Secondary | ICD-10-CM | POA: Diagnosis present

## 2015-05-29 DIAGNOSIS — Z885 Allergy status to narcotic agent status: Secondary | ICD-10-CM

## 2015-05-29 DIAGNOSIS — E118 Type 2 diabetes mellitus with unspecified complications: Secondary | ICD-10-CM | POA: Diagnosis present

## 2015-05-29 DIAGNOSIS — Z8041 Family history of malignant neoplasm of ovary: Secondary | ICD-10-CM

## 2015-05-29 DIAGNOSIS — E1142 Type 2 diabetes mellitus with diabetic polyneuropathy: Secondary | ICD-10-CM | POA: Diagnosis present

## 2015-05-29 DIAGNOSIS — Z8249 Family history of ischemic heart disease and other diseases of the circulatory system: Secondary | ICD-10-CM

## 2015-05-29 DIAGNOSIS — I2510 Atherosclerotic heart disease of native coronary artery without angina pectoris: Secondary | ICD-10-CM | POA: Diagnosis present

## 2015-05-29 DIAGNOSIS — Z91018 Allergy to other foods: Secondary | ICD-10-CM

## 2015-05-29 DIAGNOSIS — L0390 Cellulitis, unspecified: Secondary | ICD-10-CM | POA: Diagnosis present

## 2015-05-29 DIAGNOSIS — Z79899 Other long term (current) drug therapy: Secondary | ICD-10-CM

## 2015-05-29 DIAGNOSIS — J45909 Unspecified asthma, uncomplicated: Secondary | ICD-10-CM | POA: Diagnosis present

## 2015-05-29 DIAGNOSIS — I429 Cardiomyopathy, unspecified: Secondary | ICD-10-CM | POA: Diagnosis present

## 2015-05-29 DIAGNOSIS — I130 Hypertensive heart and chronic kidney disease with heart failure and stage 1 through stage 4 chronic kidney disease, or unspecified chronic kidney disease: Secondary | ICD-10-CM | POA: Diagnosis present

## 2015-05-29 DIAGNOSIS — Z87442 Personal history of urinary calculi: Secondary | ICD-10-CM

## 2015-05-29 DIAGNOSIS — K580 Irritable bowel syndrome with diarrhea: Secondary | ICD-10-CM | POA: Diagnosis present

## 2015-05-29 DIAGNOSIS — E1122 Type 2 diabetes mellitus with diabetic chronic kidney disease: Secondary | ICD-10-CM | POA: Diagnosis present

## 2015-05-29 DIAGNOSIS — Z9981 Dependence on supplemental oxygen: Secondary | ICD-10-CM

## 2015-05-29 DIAGNOSIS — Z881 Allergy status to other antibiotic agents status: Secondary | ICD-10-CM

## 2015-05-29 DIAGNOSIS — N179 Acute kidney failure, unspecified: Secondary | ICD-10-CM | POA: Diagnosis present

## 2015-05-29 DIAGNOSIS — E1143 Type 2 diabetes mellitus with diabetic autonomic (poly)neuropathy: Secondary | ICD-10-CM | POA: Diagnosis present

## 2015-05-29 DIAGNOSIS — Z91040 Latex allergy status: Secondary | ICD-10-CM

## 2015-05-29 DIAGNOSIS — Z888 Allergy status to other drugs, medicaments and biological substances status: Secondary | ICD-10-CM

## 2015-05-29 DIAGNOSIS — Z7901 Long term (current) use of anticoagulants: Secondary | ICD-10-CM

## 2015-05-29 DIAGNOSIS — R112 Nausea with vomiting, unspecified: Secondary | ICD-10-CM | POA: Diagnosis not present

## 2015-05-29 DIAGNOSIS — F329 Major depressive disorder, single episode, unspecified: Secondary | ICD-10-CM | POA: Diagnosis present

## 2015-05-29 DIAGNOSIS — J449 Chronic obstructive pulmonary disease, unspecified: Secondary | ICD-10-CM | POA: Diagnosis present

## 2015-05-29 DIAGNOSIS — K219 Gastro-esophageal reflux disease without esophagitis: Secondary | ICD-10-CM | POA: Diagnosis present

## 2015-05-29 DIAGNOSIS — N390 Urinary tract infection, site not specified: Secondary | ICD-10-CM | POA: Diagnosis present

## 2015-05-29 DIAGNOSIS — E11649 Type 2 diabetes mellitus with hypoglycemia without coma: Secondary | ICD-10-CM | POA: Diagnosis present

## 2015-05-29 DIAGNOSIS — I5032 Chronic diastolic (congestive) heart failure: Secondary | ICD-10-CM | POA: Diagnosis present

## 2015-05-29 DIAGNOSIS — M109 Gout, unspecified: Secondary | ICD-10-CM | POA: Diagnosis present

## 2015-05-29 DIAGNOSIS — L7622 Postprocedural hemorrhage and hematoma of skin and subcutaneous tissue following other procedure: Secondary | ICD-10-CM | POA: Diagnosis not present

## 2015-05-29 DIAGNOSIS — E785 Hyperlipidemia, unspecified: Secondary | ICD-10-CM | POA: Diagnosis present

## 2015-05-29 DIAGNOSIS — I129 Hypertensive chronic kidney disease with stage 1 through stage 4 chronic kidney disease, or unspecified chronic kidney disease: Secondary | ICD-10-CM | POA: Diagnosis present

## 2015-05-29 LAB — COMPREHENSIVE METABOLIC PANEL
ALBUMIN: 3.8 g/dL (ref 3.5–5.0)
ALT: 22 U/L (ref 14–54)
AST: 48 U/L — AB (ref 15–41)
Alkaline Phosphatase: 45 U/L (ref 38–126)
Anion gap: 16 — ABNORMAL HIGH (ref 5–15)
BUN: 28 mg/dL — AB (ref 6–20)
CHLORIDE: 92 mmol/L — AB (ref 101–111)
CO2: 29 mmol/L (ref 22–32)
Calcium: 10.9 mg/dL — ABNORMAL HIGH (ref 8.9–10.3)
Creatinine, Ser: 3.32 mg/dL — ABNORMAL HIGH (ref 0.44–1.00)
GFR calc Af Amer: 15 mL/min — ABNORMAL LOW (ref >60)
GFR calc non Af Amer: 13 mL/min — ABNORMAL LOW (ref >60)
GLUCOSE: 129 mg/dL — AB (ref 65–99)
POTASSIUM: 3.8 mmol/L (ref 3.5–5.1)
Sodium: 137 mmol/L (ref 135–145)
Total Bilirubin: 0.7 mg/dL (ref 0.3–1.2)
Total Protein: 7.5 g/dL (ref 6.5–8.1)

## 2015-05-29 LAB — URINALYSIS, ROUTINE W REFLEX MICROSCOPIC
Bilirubin Urine: NEGATIVE
GLUCOSE, UA: NEGATIVE mg/dL
HGB URINE DIPSTICK: NEGATIVE
Ketones, ur: NEGATIVE mg/dL
Nitrite: NEGATIVE
Protein, ur: NEGATIVE mg/dL
SPECIFIC GRAVITY, URINE: 1.012 (ref 1.005–1.030)
pH: 7 (ref 5.0–8.0)

## 2015-05-29 LAB — I-STAT CG4 LACTIC ACID, ED: Lactic Acid, Venous: 3.35 mmol/L (ref 0.5–2.0)

## 2015-05-29 LAB — CBC WITH DIFFERENTIAL/PLATELET
BASOS ABS: 0 10*3/uL (ref 0.0–0.1)
BASOS PCT: 0 %
Eosinophils Absolute: 0.2 10*3/uL (ref 0.0–0.7)
Eosinophils Relative: 3 %
HEMATOCRIT: 38.7 % (ref 36.0–46.0)
Hemoglobin: 11.8 g/dL — ABNORMAL LOW (ref 12.0–15.0)
Lymphocytes Relative: 42 %
Lymphs Abs: 3.8 10*3/uL (ref 0.7–4.0)
MCH: 26.9 pg (ref 26.0–34.0)
MCHC: 30.5 g/dL (ref 30.0–36.0)
MCV: 88.2 fL (ref 78.0–100.0)
MONO ABS: 0.7 10*3/uL (ref 0.1–1.0)
Monocytes Relative: 8 %
NEUTROS ABS: 4.2 10*3/uL (ref 1.7–7.7)
Neutrophils Relative %: 47 %
PLATELETS: 304 10*3/uL (ref 150–400)
RBC: 4.39 MIL/uL (ref 3.87–5.11)
RDW: 18.6 % — AB (ref 11.5–15.5)
WBC: 9 10*3/uL (ref 4.0–10.5)

## 2015-05-29 LAB — URINE MICROSCOPIC-ADD ON

## 2015-05-29 MED ORDER — SODIUM CHLORIDE 0.9 % IV BOLUS (SEPSIS)
2000.0000 mL | Freq: Once | INTRAVENOUS | Status: AC
  Administered 2015-05-29: 2000 mL via INTRAVENOUS

## 2015-05-29 MED ORDER — CLINDAMYCIN PHOSPHATE 900 MG/50ML IV SOLN
900.0000 mg | Freq: Once | INTRAVENOUS | Status: AC
  Administered 2015-05-29: 900 mg via INTRAVENOUS
  Filled 2015-05-29: qty 50
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
## 2015-05-29 NOTE — ED Notes (Signed)
PT c/o of bleeding from surgical incision. Pt had her R kidney removed on February 20, 2015. This morning around 4am, pt said that she felt a stinging sensation at her incision and saw blood running down her leg. Pt denies any pus or white/yellow discharge. Pt has saturated 4 4x4s with blood today. Pt has a hx of kidney cancer, diabetes, and CHF. Upon assessment, incision is seeping blood; no pus, warmth or odor noted. Pt A&Ox4. Pt wears 2L at home PRN.

## 2015-05-29 NOTE — ED Notes (Signed)
Pt states she had her right kidney removed in Jan. Pt states she began bleeding at around 0400 this morning. Bleeding is currently controlled and pt has an appointment tomorrow with her surgeon to follow up

## 2015-05-30 ENCOUNTER — Encounter (HOSPITAL_COMMUNITY): Payer: Self-pay

## 2015-05-30 DIAGNOSIS — L03311 Cellulitis of abdominal wall: Secondary | ICD-10-CM | POA: Diagnosis present

## 2015-05-30 DIAGNOSIS — Z8041 Family history of malignant neoplasm of ovary: Secondary | ICD-10-CM | POA: Diagnosis not present

## 2015-05-30 DIAGNOSIS — I5032 Chronic diastolic (congestive) heart failure: Secondary | ICD-10-CM | POA: Diagnosis present

## 2015-05-30 DIAGNOSIS — Z91018 Allergy to other foods: Secondary | ICD-10-CM | POA: Diagnosis not present

## 2015-05-30 DIAGNOSIS — E119 Type 2 diabetes mellitus without complications: Secondary | ICD-10-CM | POA: Diagnosis not present

## 2015-05-30 DIAGNOSIS — Z7901 Long term (current) use of anticoagulants: Secondary | ICD-10-CM | POA: Diagnosis not present

## 2015-05-30 DIAGNOSIS — E559 Vitamin D deficiency, unspecified: Secondary | ICD-10-CM | POA: Diagnosis present

## 2015-05-30 DIAGNOSIS — E1122 Type 2 diabetes mellitus with diabetic chronic kidney disease: Secondary | ICD-10-CM | POA: Diagnosis present

## 2015-05-30 DIAGNOSIS — I480 Paroxysmal atrial fibrillation: Secondary | ICD-10-CM | POA: Diagnosis present

## 2015-05-30 DIAGNOSIS — I481 Persistent atrial fibrillation: Secondary | ICD-10-CM | POA: Diagnosis present

## 2015-05-30 DIAGNOSIS — M109 Gout, unspecified: Secondary | ICD-10-CM | POA: Diagnosis present

## 2015-05-30 DIAGNOSIS — L7622 Postprocedural hemorrhage and hematoma of skin and subcutaneous tissue following other procedure: Secondary | ICD-10-CM | POA: Diagnosis present

## 2015-05-30 DIAGNOSIS — Z794 Long term (current) use of insulin: Secondary | ICD-10-CM | POA: Diagnosis not present

## 2015-05-30 DIAGNOSIS — Z79899 Other long term (current) drug therapy: Secondary | ICD-10-CM | POA: Diagnosis not present

## 2015-05-30 DIAGNOSIS — I10 Essential (primary) hypertension: Secondary | ICD-10-CM | POA: Diagnosis not present

## 2015-05-30 DIAGNOSIS — D649 Anemia, unspecified: Secondary | ICD-10-CM | POA: Diagnosis present

## 2015-05-30 DIAGNOSIS — I429 Cardiomyopathy, unspecified: Secondary | ICD-10-CM | POA: Diagnosis present

## 2015-05-30 DIAGNOSIS — N390 Urinary tract infection, site not specified: Secondary | ICD-10-CM

## 2015-05-30 DIAGNOSIS — K580 Irritable bowel syndrome with diarrhea: Secondary | ICD-10-CM | POA: Diagnosis present

## 2015-05-30 DIAGNOSIS — K219 Gastro-esophageal reflux disease without esophagitis: Secondary | ICD-10-CM | POA: Diagnosis present

## 2015-05-30 DIAGNOSIS — F329 Major depressive disorder, single episode, unspecified: Secondary | ICD-10-CM | POA: Diagnosis present

## 2015-05-30 DIAGNOSIS — I130 Hypertensive heart and chronic kidney disease with heart failure and stage 1 through stage 4 chronic kidney disease, or unspecified chronic kidney disease: Secondary | ICD-10-CM | POA: Diagnosis present

## 2015-05-30 DIAGNOSIS — Z833 Family history of diabetes mellitus: Secondary | ICD-10-CM | POA: Diagnosis not present

## 2015-05-30 DIAGNOSIS — Z888 Allergy status to other drugs, medicaments and biological substances status: Secondary | ICD-10-CM | POA: Diagnosis not present

## 2015-05-30 DIAGNOSIS — E039 Hypothyroidism, unspecified: Secondary | ICD-10-CM | POA: Diagnosis present

## 2015-05-30 DIAGNOSIS — E1143 Type 2 diabetes mellitus with diabetic autonomic (poly)neuropathy: Secondary | ICD-10-CM | POA: Diagnosis present

## 2015-05-30 DIAGNOSIS — Z800 Family history of malignant neoplasm of digestive organs: Secondary | ICD-10-CM | POA: Diagnosis not present

## 2015-05-30 DIAGNOSIS — Z9981 Dependence on supplemental oxygen: Secondary | ICD-10-CM | POA: Diagnosis not present

## 2015-05-30 DIAGNOSIS — Z8249 Family history of ischemic heart disease and other diseases of the circulatory system: Secondary | ICD-10-CM | POA: Diagnosis not present

## 2015-05-30 DIAGNOSIS — E118 Type 2 diabetes mellitus with unspecified complications: Secondary | ICD-10-CM | POA: Diagnosis present

## 2015-05-30 DIAGNOSIS — E785 Hyperlipidemia, unspecified: Secondary | ICD-10-CM | POA: Diagnosis present

## 2015-05-30 DIAGNOSIS — F419 Anxiety disorder, unspecified: Secondary | ICD-10-CM | POA: Diagnosis present

## 2015-05-30 DIAGNOSIS — Z880 Allergy status to penicillin: Secondary | ICD-10-CM | POA: Diagnosis not present

## 2015-05-30 DIAGNOSIS — L0390 Cellulitis, unspecified: Secondary | ICD-10-CM | POA: Diagnosis present

## 2015-05-30 DIAGNOSIS — Z803 Family history of malignant neoplasm of breast: Secondary | ICD-10-CM | POA: Diagnosis not present

## 2015-05-30 DIAGNOSIS — G4730 Sleep apnea, unspecified: Secondary | ICD-10-CM | POA: Diagnosis present

## 2015-05-30 DIAGNOSIS — Z6841 Body Mass Index (BMI) 40.0 and over, adult: Secondary | ICD-10-CM | POA: Diagnosis not present

## 2015-05-30 DIAGNOSIS — N184 Chronic kidney disease, stage 4 (severe): Secondary | ICD-10-CM | POA: Diagnosis present

## 2015-05-30 DIAGNOSIS — J45909 Unspecified asthma, uncomplicated: Secondary | ICD-10-CM | POA: Diagnosis present

## 2015-05-30 DIAGNOSIS — Z91040 Latex allergy status: Secondary | ICD-10-CM | POA: Diagnosis not present

## 2015-05-30 DIAGNOSIS — C651 Malignant neoplasm of right renal pelvis: Secondary | ICD-10-CM | POA: Diagnosis present

## 2015-05-30 DIAGNOSIS — Z885 Allergy status to narcotic agent status: Secondary | ICD-10-CM | POA: Diagnosis not present

## 2015-05-30 DIAGNOSIS — Z881 Allergy status to other antibiotic agents status: Secondary | ICD-10-CM | POA: Diagnosis not present

## 2015-05-30 DIAGNOSIS — R112 Nausea with vomiting, unspecified: Secondary | ICD-10-CM | POA: Diagnosis not present

## 2015-05-30 DIAGNOSIS — Z9049 Acquired absence of other specified parts of digestive tract: Secondary | ICD-10-CM | POA: Diagnosis not present

## 2015-05-30 DIAGNOSIS — Z8051 Family history of malignant neoplasm of kidney: Secondary | ICD-10-CM | POA: Diagnosis not present

## 2015-05-30 DIAGNOSIS — Z90710 Acquired absence of both cervix and uterus: Secondary | ICD-10-CM | POA: Diagnosis not present

## 2015-05-30 DIAGNOSIS — J449 Chronic obstructive pulmonary disease, unspecified: Secondary | ICD-10-CM | POA: Diagnosis present

## 2015-05-30 DIAGNOSIS — I2510 Atherosclerotic heart disease of native coronary artery without angina pectoris: Secondary | ICD-10-CM | POA: Diagnosis present

## 2015-05-30 DIAGNOSIS — E1142 Type 2 diabetes mellitus with diabetic polyneuropathy: Secondary | ICD-10-CM | POA: Diagnosis present

## 2015-05-30 DIAGNOSIS — E1129 Type 2 diabetes mellitus with other diabetic kidney complication: Secondary | ICD-10-CM | POA: Diagnosis present

## 2015-05-30 DIAGNOSIS — Z87442 Personal history of urinary calculi: Secondary | ICD-10-CM | POA: Diagnosis not present

## 2015-05-30 DIAGNOSIS — E11649 Type 2 diabetes mellitus with hypoglycemia without coma: Secondary | ICD-10-CM | POA: Diagnosis present

## 2015-05-30 DIAGNOSIS — N183 Chronic kidney disease, stage 3 (moderate): Secondary | ICD-10-CM | POA: Diagnosis not present

## 2015-05-30 DIAGNOSIS — K3184 Gastroparesis: Secondary | ICD-10-CM | POA: Diagnosis present

## 2015-05-30 DIAGNOSIS — N179 Acute kidney failure, unspecified: Secondary | ICD-10-CM | POA: Diagnosis present

## 2015-05-30 DIAGNOSIS — Z9109 Other allergy status, other than to drugs and biological substances: Secondary | ICD-10-CM | POA: Diagnosis not present

## 2015-05-30 HISTORY — DX: Urinary tract infection, site not specified: N39.0

## 2015-05-30 HISTORY — DX: Cellulitis of abdominal wall: L03.311

## 2015-05-30 HISTORY — DX: Type 2 diabetes mellitus with unspecified complications: E11.8

## 2015-05-30 HISTORY — DX: Cellulitis, unspecified: L03.90

## 2015-05-30 LAB — GLUCOSE, CAPILLARY
GLUCOSE-CAPILLARY: 120 mg/dL — AB (ref 65–99)
Glucose-Capillary: 94 mg/dL (ref 65–99)
Glucose-Capillary: 128 mg/dL — ABNORMAL HIGH (ref 65–99)
Glucose-Capillary: 142 mg/dL — ABNORMAL HIGH (ref 65–99)

## 2015-05-30 LAB — CBC
HEMATOCRIT: 33.2 % — AB (ref 36.0–46.0)
Hemoglobin: 10.2 g/dL — ABNORMAL LOW (ref 12.0–15.0)
MCH: 27 pg (ref 26.0–34.0)
MCHC: 30.7 g/dL (ref 30.0–36.0)
MCV: 87.8 fL (ref 78.0–100.0)
PLATELETS: 269 10*3/uL (ref 150–400)
RBC: 3.78 MIL/uL — ABNORMAL LOW (ref 3.87–5.11)
RDW: 18.7 % — AB (ref 11.5–15.5)
WBC: 6.9 10*3/uL (ref 4.0–10.5)

## 2015-05-30 LAB — CBC WITH DIFFERENTIAL/PLATELET
BASOS ABS: 0 10*3/uL (ref 0.0–0.1)
BASOS PCT: 0 %
EOS ABS: 0.3 10*3/uL (ref 0.0–0.7)
Eosinophils Relative: 3 %
HEMATOCRIT: 34.1 % — AB (ref 36.0–46.0)
HEMOGLOBIN: 10.3 g/dL — AB (ref 12.0–15.0)
Lymphocytes Relative: 40 %
Lymphs Abs: 3.3 10*3/uL (ref 0.7–4.0)
MCH: 26.4 pg (ref 26.0–34.0)
MCHC: 30.2 g/dL (ref 30.0–36.0)
MCV: 87.4 fL (ref 78.0–100.0)
Monocytes Absolute: 0.9 10*3/uL (ref 0.1–1.0)
Monocytes Relative: 11 %
NEUTROS ABS: 3.7 10*3/uL (ref 1.7–7.7)
NEUTROS PCT: 46 %
Platelets: 278 10*3/uL (ref 150–400)
RBC: 3.9 MIL/uL (ref 3.87–5.11)
RDW: 18.7 % — ABNORMAL HIGH (ref 11.5–15.5)
WBC: 8.1 10*3/uL (ref 4.0–10.5)

## 2015-05-30 LAB — LACTIC ACID, PLASMA
LACTIC ACID, VENOUS: 2.9 mmol/L — AB (ref 0.5–2.0)
Lactic Acid, Venous: 3 mmol/L (ref 0.5–2.0)
Lactic Acid, Venous: 3.2 mmol/L (ref 0.5–2.0)

## 2015-05-30 LAB — PROCALCITONIN: Procalcitonin: <0.1 ng/mL

## 2015-05-30 LAB — COMPREHENSIVE METABOLIC PANEL
ALBUMIN: 3.3 g/dL — AB (ref 3.5–5.0)
ALK PHOS: 39 U/L (ref 38–126)
ALT: 20 U/L (ref 14–54)
ANION GAP: 13 (ref 5–15)
AST: 44 U/L — AB (ref 15–41)
BILIRUBIN TOTAL: 0.5 mg/dL (ref 0.3–1.2)
BUN: 28 mg/dL — AB (ref 6–20)
CALCIUM: 10 mg/dL (ref 8.9–10.3)
CO2: 30 mmol/L (ref 22–32)
Chloride: 98 mmol/L — ABNORMAL LOW (ref 101–111)
Creatinine, Ser: 3.3 mg/dL — ABNORMAL HIGH (ref 0.44–1.00)
GFR calc Af Amer: 15 mL/min — ABNORMAL LOW (ref >60)
GFR calc non Af Amer: 13 mL/min — ABNORMAL LOW (ref >60)
GLUCOSE: 94 mg/dL (ref 65–99)
Potassium: 3.5 mmol/L (ref 3.5–5.1)
SODIUM: 141 mmol/L (ref 135–145)
TOTAL PROTEIN: 6.5 g/dL (ref 6.5–8.1)

## 2015-05-30 LAB — HEPARIN LEVEL (UNFRACTIONATED)
HEPARIN UNFRACTIONATED: 1.56 [IU]/mL — AB (ref 0.30–0.70)
Heparin Unfractionated: 1.03 IU/mL — ABNORMAL HIGH (ref 0.30–0.70)
Heparin Unfractionated: 0.79 IU/mL — ABNORMAL HIGH (ref 0.30–0.70)

## 2015-05-30 LAB — PROTIME-INR
INR: 1.75 — AB (ref 0.00–1.49)
Prothrombin Time: 19.8 seconds — ABNORMAL HIGH (ref 11.6–15.2)

## 2015-05-30 LAB — TYPE AND SCREEN
ABO/RH(D): A POS
ANTIBODY SCREEN: NEGATIVE

## 2015-05-30 LAB — MRSA PCR SCREENING: MRSA by PCR: POSITIVE — AB

## 2015-05-30 LAB — I-STAT CG4 LACTIC ACID, ED: Lactic Acid, Venous: 2.22 mmol/L (ref 0.5–2.0)

## 2015-05-30 LAB — APTT
APTT: 59 s — AB (ref 24–37)
APTT: 72 s — AB (ref 24–37)
aPTT: 36 seconds (ref 24–37)

## 2015-05-30 MED ORDER — ALBUTEROL SULFATE (2.5 MG/3ML) 0.083% IN NEBU: 2.5000 mg | INHALATION_SOLUTION | RESPIRATORY_TRACT | Status: DC | PRN

## 2015-05-30 MED ORDER — LORATADINE 10 MG PO TABS
10.0000 mg | ORAL_TABLET | Freq: Every day | ORAL | Status: DC
  Administered 2015-05-30 – 2015-06-02 (×4): 10 mg via ORAL
  Filled 2015-05-30 (×4): qty 1

## 2015-05-30 MED ORDER — SIMVASTATIN 20 MG PO TABS
20.0000 mg | ORAL_TABLET | Freq: Every day | ORAL | Status: DC
  Administered 2015-05-30 – 2015-06-01 (×3): 20 mg via ORAL
  Filled 2015-05-30 (×5): qty 1

## 2015-05-30 MED ORDER — INSULIN GLARGINE 100 UNIT/ML ~~LOC~~ SOLN
35.0000 [IU] | Freq: Every day | SUBCUTANEOUS | Status: DC
  Administered 2015-05-30: 35 [IU] via SUBCUTANEOUS
  Administered 2015-05-31: 15 [IU] via SUBCUTANEOUS
  Filled 2015-05-30 (×2): qty 0.35

## 2015-05-30 MED ORDER — SODIUM CHLORIDE 0.9 % IV SOLN
INTRAVENOUS | Status: AC
  Administered 2015-05-30: 12:00:00 via INTRAVENOUS

## 2015-05-30 MED ORDER — DIPHENHYDRAMINE HCL 25 MG PO CAPS: 25.0000 mg | ORAL_CAPSULE | Freq: Four times a day (QID) | ORAL | Status: DC | PRN

## 2015-05-30 MED ORDER — DILTIAZEM HCL ER COATED BEADS 240 MG PO CP24
240.0000 mg | ORAL_CAPSULE | Freq: Every day | ORAL | Status: DC
  Administered 2015-05-30 – 2015-06-02 (×4): 240 mg via ORAL
  Filled 2015-05-30 (×4): qty 1

## 2015-05-30 MED ORDER — ACETAMINOPHEN 650 MG RE SUPP: 650.0000 mg | Freq: Four times a day (QID) | RECTAL | Status: DC | PRN

## 2015-05-30 MED ORDER — SODIUM CHLORIDE 0.9 % IV SOLN
500.0000 mg | Freq: Once | INTRAVENOUS | Status: AC
  Administered 2015-05-30: 500 mg via INTRAVENOUS
  Filled 2015-05-30: qty 500

## 2015-05-30 MED ORDER — SODIUM CHLORIDE 0.9 % IV BOLUS (SEPSIS)
250.0000 mL | Freq: Once | INTRAVENOUS | Status: AC
  Administered 2015-05-30: 250 mL via INTRAVENOUS

## 2015-05-30 MED ORDER — ACETAMINOPHEN 325 MG PO TABS
650.0000 mg | ORAL_TABLET | Freq: Four times a day (QID) | ORAL | Status: DC | PRN
  Administered 2015-05-31: 650 mg via ORAL
  Filled 2015-05-30: qty 2

## 2015-05-30 MED ORDER — METOPROLOL TARTRATE 25 MG PO TABS
25.0000 mg | ORAL_TABLET | Freq: Two times a day (BID) | ORAL | Status: DC
  Administered 2015-05-30 – 2015-06-02 (×7): 25 mg via ORAL
  Filled 2015-05-30 (×8): qty 1

## 2015-05-30 MED ORDER — SODIUM CHLORIDE 0.9 % IV SOLN
250.0000 mg | Freq: Two times a day (BID) | INTRAVENOUS | Status: DC
  Administered 2015-05-30 – 2015-06-02 (×6): 250 mg via INTRAVENOUS
  Filled 2015-05-30 (×7): qty 250

## 2015-05-30 MED ORDER — LEVOTHYROXINE SODIUM 137 MCG PO TABS
137.0000 ug | ORAL_TABLET | Freq: Every day | ORAL | Status: DC
  Administered 2015-05-30 – 2015-06-02 (×4): 137 ug via ORAL
  Filled 2015-05-30 (×6): qty 1

## 2015-05-30 MED ORDER — CLINDAMYCIN PHOSPHATE 600 MG/50ML IV SOLN
600.0000 mg | Freq: Three times a day (TID) | INTRAVENOUS | Status: DC
  Administered 2015-05-30 (×2): 600 mg via INTRAVENOUS
  Filled 2015-05-30 (×3): qty 50

## 2015-05-30 MED ORDER — SIMETHICONE 80 MG PO CHEW
125.0000 mg | CHEWABLE_TABLET | Freq: Four times a day (QID) | ORAL | Status: DC | PRN
Start: 2015-05-30 — End: 2015-06-02

## 2015-05-30 MED ORDER — ACETAMINOPHEN 325 MG PO TABS
650.0000 mg | ORAL_TABLET | ORAL | Status: DC | PRN
  Administered 2015-05-30: 650 mg via ORAL
  Filled 2015-05-30: qty 2

## 2015-05-30 MED ORDER — SODIUM CHLORIDE 0.9 % IV SOLN
INTRAVENOUS | Status: DC
  Administered 2015-05-30: 02:00:00 via INTRAVENOUS

## 2015-05-30 MED ORDER — HEPARIN (PORCINE) IN NACL 100-0.45 UNIT/ML-% IJ SOLN
1100.0000 [IU]/h | INTRAMUSCULAR | Status: AC
  Administered 2015-05-30 – 2015-05-31 (×2): 1100 [IU]/h via INTRAVENOUS
  Filled 2015-05-30 (×2): qty 250

## 2015-05-30 MED ORDER — MUPIROCIN 2 % EX OINT
1.0000 "application " | TOPICAL_OINTMENT | Freq: Two times a day (BID) | CUTANEOUS | Status: DC
  Administered 2015-05-30 – 2015-06-02 (×6): 1 via NASAL
  Filled 2015-05-30: qty 22

## 2015-05-30 MED ORDER — AMIODARONE HCL 100 MG PO TABS
100.0000 mg | ORAL_TABLET | Freq: Every day | ORAL | Status: DC
  Administered 2015-05-30 – 2015-06-02 (×4): 100 mg via ORAL
  Filled 2015-05-30 (×4): qty 1

## 2015-05-30 MED ORDER — INSULIN GLARGINE 100 UNIT/ML ~~LOC~~ SOLN
30.0000 [IU] | Freq: Every day | SUBCUTANEOUS | Status: DC
  Administered 2015-05-30: 30 [IU] via SUBCUTANEOUS
  Filled 2015-05-30 (×2): qty 0.3

## 2015-05-30 MED ORDER — LINEZOLID 600 MG/300ML IV SOLN
600.0000 mg | Freq: Two times a day (BID) | INTRAVENOUS | Status: DC
  Administered 2015-05-30 – 2015-06-01 (×4): 600 mg via INTRAVENOUS
  Filled 2015-05-30 (×4): qty 300

## 2015-05-30 MED ORDER — SODIUM CHLORIDE 0.9 % IV BOLUS (SEPSIS)
1000.0000 mL | Freq: Once | INTRAVENOUS | Status: AC
  Administered 2015-05-30: 1000 mL via INTRAVENOUS

## 2015-05-30 MED ORDER — CLONAZEPAM 0.5 MG PO TABS
0.5000 mg | ORAL_TABLET | Freq: Three times a day (TID) | ORAL | Status: DC | PRN
Start: 2015-05-30 — End: 2015-06-02
  Administered 2015-05-30 – 2015-06-01 (×3): 1 mg via ORAL
  Filled 2015-05-30 (×3): qty 2

## 2015-05-30 MED ORDER — CHLORHEXIDINE GLUCONATE CLOTH 2 % EX PADS
6.0000 | MEDICATED_PAD | Freq: Every day | CUTANEOUS | Status: DC
  Administered 2015-05-31 – 2015-06-02 (×3): 6 via TOPICAL

## 2015-05-30 MED ORDER — ALBUTEROL SULFATE HFA 108 (90 BASE) MCG/ACT IN AERS: 2.0000 | INHALATION_SPRAY | RESPIRATORY_TRACT | Status: DC | PRN

## 2015-05-30 MED ORDER — INSULIN ASPART 100 UNIT/ML ~~LOC~~ SOLN
0.0000 [IU] | Freq: Three times a day (TID) | SUBCUTANEOUS | Status: DC
  Administered 2015-05-31 (×2): 1 [IU] via SUBCUTANEOUS

## 2015-05-30 MED ORDER — GLIPIZIDE ER 10 MG PO TB24
10.0000 mg | ORAL_TABLET | Freq: Every day | ORAL | Status: DC
Start: 2015-05-30 — End: 2015-05-30
  Administered 2015-05-30: 10 mg via ORAL
  Filled 2015-05-30 (×2): qty 1

## 2015-05-30 NOTE — Progress Notes (Signed)
ANTICOAGULATION CONSULT NOTE - Initial Consult  Pharmacy Consult for Heparin Indication: atrial fibrillation  Allergies  Allergen Reactions  . Adhesive [Tape] Itching, Swelling, Rash and Other (See Comments)    Tears skin and causes blisters also. EKG pads will cause welps.   [name] Flavor Anaphylaxis  . Cefprozil Shortness Of Breath and Rash  . Dicyclomine Nausea And Vomiting and Other (See Comments)    "Heart trouble"; Headaches and increased blood sugars  . Food Anaphylaxis and Other (See Comments)    Melons, Bananas, Cantaloupes, Watermelon-throat closes up and blisters   . Imdur [Isosorbide Nitrate] Hives, Palpitations and Other (See Comments)    Headaches also  . Januvia [Sitagliptin] Shortness Of Breath  . Lipitor [Atorvastatin] Shortness Of Breath  . Losartan Potassium Shortness Of Breath  . Nitroglycerin Other (See Comments)    Caused cardiac arrest and feels like skin bring torn off back of head  . Penicillins Anaphylaxis    Has patient had a PCN reaction causing immediate rash, facial/tongue/throat swelling, SOB or lightheadedness with hypotension: Yes Has patient had a PCN reaction causing severe rash involving mucus membranes or skin necrosis: No Has patient had a PCN reaction that required hospitalization Yes Has patient had a PCN reaction occurring within the last 10 years: No If all of the above answers are "NO", then may proceed with Cephalosporin use.   . Prednisone Anaphylaxis  . Vancomycin Anaphylaxis  . Cetacaine [Butamben-Tetracaine-Benzocaine] Nausea And Vomiting and Swelling  . Hydrocodone Hives  . Latex Other (See Comments)    blisters  . Oxycodone Hives  . Tamiflu [Oseltamivir] Other (See Comments)    Patient on tikosyn, and tamiflu interfered with anti arrhythmic med  . Lasix [Furosemide] Hives and Swelling  . Avelox [Moxifloxacin] Swelling and Rash   Patient Measurements: Height: 5\' 2"  (157.5 cm) Weight: 215 lb 6.2 oz (97.7 kg) IBW/kg  (Calculated) : 50.1 Heparin Dosing Weight: 73  Vital Signs: Temp: 97.5 F (36.4 C) (05/04 1424) Temp Source: Oral (05/04 1424) BP: 120/65 mmHg (05/04 1424) Pulse Rate: 54 (05/04 1424)  Labs:  Recent Labs  05/29/15 2051 05/30/15 0545 05/30/15 1411  HGB 11.8* 10.3* 10.2*  HCT 38.7 34.1* 33.2*  PLT 304 278 269  APTT  --  36  --   LABPROT  --  19.8*  --   INR  --  1.75*  --   HEPARINUNFRC  --  1.56* 1.03*  CREATININE 3.32* 3.30*  --    Estimated Creatinine Clearance: 16.8 mL/min (by C-G formula based on Cr of 3.3).  Medical History: Past Medical History  Diagnosis Date  . Hypertensive heart disease   . Hyperlipidemia   . Hypothyroidism   . Obesity (BMI 30-39.9)   . GERD (gastroesophageal reflux disease)   . Degenerative disc disease, cervical   . Vitamin D deficiency   . Depression   . Bell's palsy   . Gastroparesis   . Internal hemorrhoid   . Hiatal hernia   . Anxiety   . Osteoarthritis   . Adenomatous colon polyp 02/13/09  . Diverticulosis   . Status post dilation of esophageal narrowing   . IBS (irritable bowel syndrome)   . PAF (paroxysmal atrial fibrillation) ([place])     a. 2015 - was on tikosyn but developed QT prolongation and torsades in setting of azithromycin-->tikosyn d/c'd, later switched to [facility] 08/2014;  b. 08/2014 s/p AF RFCA;  c. 11/2014 Amio reduced to 100mg  QD;  d. CHA2DS2VASc = 6-->chronic xarelto, reduced to 15mg  QD  02/2014 in setting of CKD/nephrectomy.  . Diabetes mellitus without complication ([place])   . Diverticulitis   . Syncope     a. 12/2012: MDT Reveal LINQ ILR placed;  b. 12/2012 Echo: EF 45-50%, Gr 3 DD, mild MR, mildly dil LA;  c. 12/2012 Carotid U/S: 1-39% bilat ICA stenosis.  . Non-obstructive CAD     a. 12/2012 Cath: LM nl, LAD 50p/m, LCX 50-62m (FFR 0.93), RCA min irregs, EF 55-65%-->Med Rx. b. L&RHC 03/21/2014 EF 50-55%, 50% eccentric LCx stenosis with negative FFR, 40% ostial RCA stenosis, 40-50% mid LAD stenosis   . Neuropathy ([place])    . Atrial flutter ([place])     a. By ILR interrogation.  Marland Kitchen NICM (nonischemic cardiomyopathy) ([place])     a. 12/2012 Echo: EF 45% with grade 3 DD;  b. 08/2014 TEE: EF 55%, no rwma, mod RAE, mod-sev LAE, triv MR/TR, No LAA thrombus, no PFO/ASD, Grade III plaque in desc thoracic Ao.  Marland Kitchen Chronic diastolic CHF (congestive heart failure) ([place])     a. 12/2012 Echo: EF 45%, grade 3 DD; b. 08/2014 TEE: EF 55%.  . Gout   . Neuropathy of both feet ([place])   . Asthma   . Oxygen dependent     a. patient uses 1l at rest and 2L with exertion   . Hematuria   . Cancer of right renal pelvis ([place])     a. 01/2015 s/p robot assisted lap nephroureterectomy, lysis of adhesions.  . Sleep apnea     pt scored 5 per stop bang tool per PAT visit 02/14/2015; results sent to PCP Dr Melina Copa   . CKD (chronic kidney disease), stage III     a. Creat Cl 32.3 (Cockcroft-Gault using her actual weight).   Medications:  Scheduled:  . amiodarone  100 mg Oral Daily  . diltiazem  240 mg Oral Daily  . imipenem-cilastatin  250 mg Intravenous Q12H  . insulin aspart  0-9 Units Subcutaneous TID WC  . insulin glargine  30 Units Subcutaneous QHS  . insulin glargine  35 Units Subcutaneous Daily  . levothyroxine  137 mcg Oral QAC breakfast  . linezolid (ZYVOX) IV  600 mg Intravenous Q12H  . loratadine  10 mg Oral Daily  . metoprolol  25 mg Oral BID  . simvastatin  20 mg Oral Q2000   Infusions:  . sodium chloride 100 mL/hr at 05/30/15 1156  . heparin 1,100 Units/hr (05/30/15 0600)   Assessment:  12yr female on Xarelto 15mg  daily (last dose 5/2 at 2030) PTA for AFib. 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 To ED with purulence at suture site, no acute abd abscess on CT.  Pharmacy dosing Primaxin for treatment of abdominal nephrectomy wound with area of cellulitis  Pharmacy consulted to dose IV heparin for afib, Obtained baseline aPTT, INR, HL, CBC  Xarelto will falsely elevate heparin level, so will  have to monitor aPTT along with heparin level until both levels correlate at which point only heparin level will need to be monitored.  Today at 1400: 1st Heparin level 1.03, aPTT 59 sec.  Hep level elevated secondary to Xarelto, aPTT low end of range  Goal of Therapy:   Heparin level 0.3-0.7 units/ml Monitor platelets by anticoagulation protocol: Yes   Plan:   Continue IV heparin @ 1100 units/hr  Repeat aPTT and heparin level in 8 hr  Follow CBC daily  Minda Ditto PharmD Pager [phone] 05/30/2015, 5:51 PM

## 2015-05-30 NOTE — Progress Notes (Signed)
ANTICOAGULATION CONSULT NOTE - Initial Consult  Pharmacy Consult for Heparin Indication: atrial fibrillation  Allergies  Allergen Reactions  . Adhesive [Tape] Itching, Swelling, Rash and Other (See Comments)    Tears skin and causes blisters also. EKG pads will cause welps.   [name] Flavor Anaphylaxis  . Cefprozil Shortness Of Breath and Rash  . Dicyclomine Nausea And Vomiting and Other (See Comments)    "Heart trouble"; Headaches and increased blood sugars  . Food Anaphylaxis and Other (See Comments)    Melons, Bananas, Cantaloupes, Watermelon-throat closes up and blisters   . Imdur [Isosorbide Nitrate] Hives, Palpitations and Other (See Comments)    Headaches also  . Januvia [Sitagliptin] Shortness Of Breath  . Lipitor [Atorvastatin] Shortness Of Breath  . Losartan Potassium Shortness Of Breath  . Nitroglycerin Other (See Comments)    Caused cardiac arrest and feels like skin bring torn off back of head  . Penicillins Anaphylaxis    Has patient had a PCN reaction causing immediate rash, facial/tongue/throat swelling, SOB or lightheadedness with hypotension: Yes Has patient had a PCN reaction causing severe rash involving mucus membranes or skin necrosis: No Has patient had a PCN reaction that required hospitalization Yes Has patient had a PCN reaction occurring within the last 10 years: No If all of the above answers are "NO", then may proceed with Cephalosporin use.   . Prednisone Anaphylaxis  . Vancomycin Anaphylaxis  . Cetacaine [Butamben-Tetracaine-Benzocaine] Nausea And Vomiting and Swelling  . Hydrocodone Hives  . Latex Other (See Comments)    blisters  . Oxycodone Hives  . Tamiflu [Oseltamivir] Other (See Comments)    Patient on tikosyn, and tamiflu interfered with anti arrhythmic med  . Lasix [Furosemide] Hives and Swelling  . Avelox [Moxifloxacin] Swelling and Rash    Patient Measurements: Height: 5\' 2"  (157.5 cm) Weight: 215 lb 6.2 oz (97.7 kg) IBW/kg  (Calculated) : 50.1 Heparin Dosing Weight: 73  Vital Signs: Temp: 98.1 F (36.7 C) (05/04 0144) Temp Source: Oral (05/04 0144) BP: 123/60 mmHg (05/04 0144) Pulse Rate: 62 (05/04 0144)  Labs:  Recent Labs  05/29/15 2051  HGB 11.8*  HCT 38.7  PLT 304  CREATININE 3.32*    Estimated Creatinine Clearance: 16.7 mL/min (by C-G formula based on Cr of 3.32).   Medical History: Past Medical History  Diagnosis Date  . Hypertensive heart disease   . Hyperlipidemia   . Hypothyroidism   . Obesity (BMI 30-39.9)   . GERD (gastroesophageal reflux disease)   . Degenerative disc disease, cervical   . Vitamin D deficiency   . Depression   . Bell's palsy   . Gastroparesis   . Internal hemorrhoid   . Hiatal hernia   . Anxiety   . Osteoarthritis   . Adenomatous colon polyp 02/13/09  . Diverticulosis   . Status post dilation of esophageal narrowing   . IBS (irritable bowel syndrome)   . PAF (paroxysmal atrial fibrillation) ([place])     a. 2015 - was on tikosyn but developed QT prolongation and torsades in setting of azithromycin-->tikosyn d/c'd, later switched to [facility] 08/2014;  b. 08/2014 s/p AF RFCA;  c. 11/2014 Amio reduced to 100mg  QD;  d. CHA2DS2VASc = 6-->chronic xarelto, reduced to 15mg  QD 02/2014 in setting of CKD/nephrectomy.  . Diabetes mellitus without complication ([place])   . Diverticulitis   . Syncope     a. 12/2012: MDT Reveal LINQ ILR placed;  b. 12/2012 Echo: EF 45-50%, Gr 3 DD, mild MR, mildly dil LA;  c. 12/2012 Carotid U/S: 1-39% bilat ICA stenosis.  . Non-obstructive CAD     a. 12/2012 Cath: LM nl, LAD 50p/m, LCX 50-63m (FFR 0.93), RCA min irregs, EF 55-65%-->Med Rx. b. L&RHC 03/21/2014 EF 50-55%, 50% eccentric LCx stenosis with negative FFR, 40% ostial RCA stenosis, 40-50% mid LAD stenosis   . Neuropathy ([place])   . Atrial flutter ([place])     a. By ILR interrogation.  Marland Kitchen NICM (nonischemic cardiomyopathy) ([place])     a. 12/2012 Echo: EF 45% with grade 3 DD;  b. 08/2014 TEE: EF 55%,  no rwma, mod RAE, mod-sev LAE, triv MR/TR, No LAA thrombus, no PFO/ASD, Grade III plaque in desc thoracic Ao.  Marland Kitchen Chronic diastolic CHF (congestive heart failure) ([place])     a. 12/2012 Echo: EF 45%, grade 3 DD; b. 08/2014 TEE: EF 55%.  . Gout   . Neuropathy of both feet ([place])   . Asthma   . Oxygen dependent     a. patient uses 1l at rest and 2L with exertion   . Hematuria   . 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 01/2015 s/p robot assisted lap nephroureterectomy, lysis of adhesions.  . Sleep apnea     pt scored 5 per stop bang tool per PAT visit 02/14/2015; results sent to PCP Dr Melina Copa   . CKD (chronic kidney disease), stage III     a. Creat Cl 32.3 (Cockcroft-Gault using her actual weight).    Medications:  Scheduled:  . amiodarone  100 mg Oral Daily  . clindamycin (CLEOCIN) IV  600 mg Intravenous Q8H  . diltiazem  240 mg Oral Daily  . glipiZIDE  10 mg Oral Q breakfast  . imipenem-cilastatin  250 mg Intravenous Q12H  . insulin aspart  0-9 Units Subcutaneous TID WC  . insulin glargine  30 Units Subcutaneous QHS  . insulin glargine  35 Units Subcutaneous Daily  . levothyroxine  137 mcg Oral QAC breakfast  . loratadine  10 mg Oral Daily  . metoprolol  25 mg Oral BID  . simvastatin  20 mg Oral Q2000   Infusions:  . sodium chloride 100 mL/hr at 05/30/15 0414    Assessment:  33yr female on Eliquis PTA for PMH including AFib. 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  Pt known to pharmacy for Primaxin for treatment of abdominal nephrectomy wound with area of cellulitis  Pharmacy consulted to dose IV heparin for afib.  Xarelto will falsely elevate heparin level, so will have to monitor aPTT along heparin level until both level correlate at which point only heparin level will need to be monitored.  Goal of Therapy:   Heparin level 0.3-0.7 units/ml Monitor platelets by anticoagulation protocol: Yes   Plan:   Obtain stat aPTT, INR, HL,  CBC  Begin IV heparin @ 1100 units/hr  Check aPTT and heparin level 8 hr after heparin started  Follow CBC daily  Julieanna Geraci, Toribio Harbour, PharmD 05/30/2015,4:41 AM

## 2015-05-30 NOTE — Progress Notes (Signed)
PHARMACY - HEPARIN (brief note)  Pharmacy dosing IV heparin for afib  Patient on Xarelto PTA.  Xarelto will falsely elevate heparin level, so will have to monitor aPTT along with heparin level until both levels correlate at which point only heparin level will need to be monitored.  IV Heparin infusing @ 1200 units/hr  APTT = 72 sec (Goal 66-102 sec)  Heparin level = 0.79 (trending down as anticipated with effects of Xarelto wearing off)  No complications of therapy noted  PLAN:  Continue IV heparin @ 1200 units/hr  F/U AM labs (Heparin level & aPTT, CBC)   Leone Haven, PharmD 05/31/15 @ 00:00

## 2015-05-30 NOTE — Progress Notes (Signed)
CRITICAL VALUE ALERT  Critical value received:  Lactic Acid 3.0  Date of notification:  05/30/15  Time of notification:  0647  Critical value read back: Yes  Nurse who received alert:  Marlynn Perking  MD notified (1st page):    Time of first page: [phone]  MD notified (2nd page): K. Schoor

## 2015-05-30 NOTE — Progress Notes (Signed)
Pharmacy Antibiotic Note  [name] is a 75 y.o. female admitted on 05/29/2015 with Intra-abdominal infection.  Pharmacy has been consulted for Primaxin dosing.  S/P nephrectomy for  Cancer 01/2015.  Presents with bleeding from surgical site and chills.  Patient received Primaxin 500mg  IV x 1 in the ED.  CrCl ~ 16 ml/min.    Plan: Primaxin 250mg  IV q12h Clindamycin per MD F/U renal function, cultures/sensitivities  Height: 5\' 2"  (157.5 cm) Weight: 215 lb 6.2 oz (97.7 kg) IBW/kg (Calculated) : 50.1  Temp (24hrs), Avg:98.1 F (36.7 C), Min:97.9 F (36.6 C), Max:98.2 F (36.8 C)   Recent Labs Lab 05/29/15 2051 05/29/15 2101 05/30/15 0108  WBC 9.0  --   --   CREATININE 3.32*  --   --   LATICACIDVEN  --  3.35* 2.22*    Estimated Creatinine Clearance: 16.7 mL/min (by C-G formula based on Cr of 3.32).    Allergies  Allergen Reactions  . Adhesive [Tape] Itching, Swelling, Rash and Other (See Comments)    Tears skin and causes blisters also. EKG pads will cause welps.   [name] Flavor Anaphylaxis  . Cefprozil Shortness Of Breath and Rash  . Dicyclomine Nausea And Vomiting and Other (See Comments)    "Heart trouble"; Headaches and increased blood sugars  . Food Anaphylaxis and Other (See Comments)    Melons, Bananas, Cantaloupes, Watermelon-throat closes up and blisters   . Imdur [Isosorbide Nitrate] Hives, Palpitations and Other (See Comments)    Headaches also  . Januvia [Sitagliptin] Shortness Of Breath  . Lipitor [Atorvastatin] Shortness Of Breath  . Losartan Potassium Shortness Of Breath  . Nitroglycerin Other (See Comments)    Caused cardiac arrest and feels like skin bring torn off back of head  . Penicillins Anaphylaxis    Has patient had a PCN reaction causing immediate rash, facial/tongue/throat swelling, SOB or lightheadedness with hypotension: Yes Has patient had a PCN reaction causing severe rash involving mucus membranes or skin necrosis: No Has patient  had a PCN reaction that required hospitalization Yes Has patient had a PCN reaction occurring within the last 10 years: No If all of the above answers are "NO", then may proceed with Cephalosporin use.   . Prednisone Anaphylaxis  . Vancomycin Anaphylaxis  . Cetacaine [Butamben-Tetracaine-Benzocaine] Nausea And Vomiting and Swelling  . Hydrocodone Hives  . Latex Other (See Comments)    blisters  . Oxycodone Hives  . Tamiflu [Oseltamivir] Other (See Comments)    Patient on tikosyn, and tamiflu interfered with anti arrhythmic med  . Lasix [Furosemide] Hives and Swelling  . Avelox [Moxifloxacin] Swelling and Rash    Antimicrobials this admission:  5/3 Clinda >>   5/4 Primaxin >>    Dose adjustments this admission:    Microbiology results: 5/4 BCx:   5/4 UCx:     Thank you for allowing pharmacy to be a part of this patient's care.  Everette Rank, PharmD 05/30/2015 4:16 AM
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
## 2015-05-30 NOTE — Progress Notes (Signed)
Patient seen and examined, reported feeling better, vital stable. Wound drainage has decreased, continue broad spectrum abx, urology dr Tresa Moore consulted, he will see patient in am.

## 2015-05-31 DIAGNOSIS — Z794 Long term (current) use of insulin: Secondary | ICD-10-CM

## 2015-05-31 DIAGNOSIS — I10 Essential (primary) hypertension: Secondary | ICD-10-CM

## 2015-05-31 DIAGNOSIS — N183 Chronic kidney disease, stage 3 (moderate): Secondary | ICD-10-CM

## 2015-05-31 DIAGNOSIS — E119 Type 2 diabetes mellitus without complications: Secondary | ICD-10-CM

## 2015-05-31 LAB — CBC
HCT: 33 % — ABNORMAL LOW (ref 36.0–46.0)
HEMOGLOBIN: 10.1 g/dL — AB (ref 12.0–15.0)
MCH: 26.9 pg (ref 26.0–34.0)
MCHC: 30.6 g/dL (ref 30.0–36.0)
MCV: 87.8 fL (ref 78.0–100.0)
Platelets: 259 10*3/uL (ref 150–400)
RBC: 3.76 MIL/uL — AB (ref 3.87–5.11)
RDW: 18.7 % — ABNORMAL HIGH (ref 11.5–15.5)
WBC: 8.4 10*3/uL (ref 4.0–10.5)

## 2015-05-31 LAB — URINE CULTURE

## 2015-05-31 LAB — GLUCOSE, CAPILLARY
GLUCOSE-CAPILLARY: 71 mg/dL (ref 65–99)
GLUCOSE-CAPILLARY: 74 mg/dL (ref 65–99)
GLUCOSE-CAPILLARY: 134 mg/dL — AB (ref 65–99)
Glucose-Capillary: 65 mg/dL (ref 65–99)
Glucose-Capillary: 122 mg/dL — ABNORMAL HIGH (ref 65–99)

## 2015-05-31 LAB — COMPREHENSIVE METABOLIC PANEL
ALBUMIN: 3 g/dL — AB (ref 3.5–5.0)
ALK PHOS: 33 U/L — AB (ref 38–126)
ALT: 16 U/L (ref 14–54)
ANION GAP: 9 (ref 5–15)
AST: 30 U/L (ref 15–41)
BUN: 23 mg/dL — ABNORMAL HIGH (ref 6–20)
CALCIUM: 9.8 mg/dL (ref 8.9–10.3)
CO2: 26 mmol/L (ref 22–32)
Chloride: 105 mmol/L (ref 101–111)
Creatinine, Ser: 3.11 mg/dL — ABNORMAL HIGH (ref 0.44–1.00)
GFR calc Af Amer: 16 mL/min — ABNORMAL LOW (ref >60)
GFR calc non Af Amer: 14 mL/min — ABNORMAL LOW (ref >60)
GLUCOSE: 71 mg/dL (ref 65–99)
Potassium: 3.8 mmol/L (ref 3.5–5.1)
SODIUM: 140 mmol/L (ref 135–145)
Total Bilirubin: 0.6 mg/dL (ref 0.3–1.2)
Total Protein: 6.1 g/dL — ABNORMAL LOW (ref 6.5–8.1)

## 2015-05-31 LAB — APTT: APTT: 66 s — AB (ref 24–37)

## 2015-05-31 LAB — HEPARIN LEVEL (UNFRACTIONATED): Heparin Unfractionated: 0.64 IU/mL (ref 0.30–0.70)

## 2015-05-31 LAB — LACTIC ACID, PLASMA: Lactic Acid, Venous: 2 mmol/L (ref 0.5–2.0)

## 2015-05-31 MED ORDER — INSULIN GLARGINE 100 UNIT/ML ~~LOC~~ SOLN
25.0000 [IU] | Freq: Every day | SUBCUTANEOUS | Status: DC
  Administered 2015-05-31: 25 [IU] via SUBCUTANEOUS
  Filled 2015-05-31 (×2): qty 0.25

## 2015-05-31 MED ORDER — INSULIN GLARGINE 100 UNIT/ML ~~LOC~~ SOLN
30.0000 [IU] | Freq: Every day | SUBCUTANEOUS | Status: DC
  Filled 2015-05-31: qty 0.3

## 2015-05-31 MED ORDER — RIVAROXABAN 15 MG PO TABS
15.0000 mg | ORAL_TABLET | Freq: Every day | ORAL | Status: DC
  Administered 2015-05-31 – 2015-06-01 (×2): 15 mg via ORAL
  Filled 2015-05-31 (×3): qty 1

## 2015-05-31 NOTE — Progress Notes (Signed)
PROGRESS NOTE  [name] [id number] DOB: 06-Jul-1940 DOA: 05/29/2015 PCP: Octavio Graves, DO  HPI/Recap of past 24 hours:  Had mild hypoglycemia episodes Over all Feeling better, wound stopped draining, no fever, no pain  Assessment/Plan: Principal Problem:   Cellulitis, abdominal wall Active Problems:   Hypothyroidism   HTN (hypertension)   Persistent atrial fibrillation (HCC)   CKD (chronic kidney disease), stage III   Cellulitis   UTI (lower urinary tract infection)   Diabetes mellitus with renal manifestation ([place])  #1. Cellulitis of the abdominal wall involving the right lower quadrant at the area of the incision site for nephrectomy.  She was started on clindamycin and Primaxin then switched to zyvox and primaxin.  Wound culture/ blood cultures no growth so far. Urology Dr Tresa Moore input appreciated,  #2. Possible UTI - patient on empiric antibiotics,  urine cultures inconclusive. #3. Acute on chronic kidney disease stage IV - s/p Gently hydration. patient's diuretics was held since admission. Cr better, plan to restart lasix on 5/6  #4. Persistent atrial fibrillation status post ablation - patient is on amiodarone, Cardizem and lopressor Which are continued. Since patient has some bleeding from the site involving the cellulitis,  xarelto was held on 5/4 and she was put on heparin drip. xarelto restarted on 5/5 now that the wound stopped bleeding.  Patient's chads 2 vasc score is more than 2. #5. Chronic anemia - follow CBC . #6. Diabetes mellitus type 2 - on Lantus insulin with sliding scale coverage. Had hypoglycemic episodes, lantus dose reduced #7. Hypothyroidism on Synthroid. #8. Hyperlipidemia on statins. #9 obesity: life style changes   DVT prophylaxis: Heparin infusion from admission to 5/5, xarelto restarted on 5/5 Code Status: Full code.  Family Communication: [name].  Disposition Plan: Home in 1-2 days, pending culture  result    Consultants: Urology Dr Tresa Moore  Procedures:  none  Antibiotics:  linezolid from 5/5  primaxin   Objective: BP 100/47 mmHg  Pulse 51  Temp(Src) 96.3 F (35.7 C) (Oral)  Resp 18  Ht 5\' 2"  (1.575 m)  Wt 100.6 kg (221 lb 12.5 oz)  BMI 40.55 kg/m2  SpO2 99%  Intake/Output Summary (Last 24 hours) at 05/31/15 1810 Last data filed at 05/31/15 1307  Gross per 24 hour  Intake    240 ml  Output      0 ml  Net    240 ml   Filed Weights   05/29/15 1934 05/30/15 0100 05/31/15 0500  Weight: 93.895 kg (207 lb) 97.7 kg (215 lb 6.2 oz) 100.6 kg (221 lb 12.5 oz)    Exam:   General:  NAD, obese  Cardiovascular: RRR  Respiratory: CTABL  Abdomen: Soft/ND/NT, positive BS  Musculoskeletal: No Edema  Neuro: aaox3  Skin: RLQ old extraction site with induration and small opening in the center with minimal drainage   Data Reviewed: Basic Metabolic Panel:  Recent Labs Lab 05/29/15 2051 05/30/15 0545 05/31/15 0538  NA 137 141 140  K 3.8 3.5 3.8  CL 92* 98* 105  CO2 29 30 26   GLUCOSE 129* 94 71  BUN 28* 28* 23*  CREATININE 3.32* 3.30* 3.11*  CALCIUM 10.9* 10.0 9.8   Liver Function Tests:  Recent Labs Lab 05/29/15 2051 05/30/15 0545 05/31/15 0538  AST 48* 44* 30  ALT 22 20 16   ALKPHOS 45 39 33*  BILITOT 0.7 0.5 0.6  PROT 7.5 6.5 6.1*  ALBUMIN 3.8 3.3* 3.0*   No results for input(s): LIPASE, AMYLASE in  the last 168 hours. No results for input(s): AMMONIA in the last 168 hours. CBC:  Recent Labs Lab 05/29/15 2051 05/30/15 0545 05/30/15 1411 05/31/15 0538  WBC 9.0 8.1 6.9 8.4  NEUTROABS 4.2 3.7  --   --   HGB 11.8* 10.3* 10.2* 10.1*  HCT 38.7 34.1* 33.2* 33.0*  MCV 88.2 87.4 87.8 87.8  PLT 304 278 269 259   Cardiac Enzymes:   No results for input(s): CKTOTAL, CKMB, CKMBINDEX, TROPONINI in the last 168 hours. BNP (last 3 results)  Recent Labs  07/27/14 1341  BNP 186.3*    ProBNP (last 3 results) No results for input(s): PROBNP  in the last 8760 hours.  CBG:  Recent Labs Lab 05/31/15 0527 05/31/15 0557 05/31/15 0906 05/31/15 1144 05/31/15 1625  GLUCAP 65 71 74 134* 122*    Recent Results (from the past 240 hour(s))  Blood culture (routine x 2)     Status: None (Preliminary result)   Collection Time: 05/29/15  8:51 PM  Result Value Ref Range Status   Specimen Description BLOOD BLOOD RIGHT HAND  Final   Special Requests BOTTLES DRAWN AEROBIC AND ANAEROBIC 5CC EA  Final   Culture   Final    NO GROWTH 2 DAYS Performed at [facility]    Report Status PENDING  Incomplete  Blood culture (routine x 2)     Status: None (Preliminary result)   Collection Time: 05/29/15  8:53 PM  Result Value Ref Range Status   Specimen Description BLOOD LEFT ANTECUBITAL  Final   Special Requests IN PEDIATRIC BOTTLE [place]  Final   Culture   Final    NO GROWTH 2 DAYS Performed at [facility]    Report Status PENDING  Incomplete  Urine culture     Status: Abnormal   Collection Time: 05/29/15 10:50 PM  Result Value Ref Range Status   Specimen Description URINE, CLEAN CATCH  Final   Special Requests NONE  Final   Culture MULTIPLE SPECIES PRESENT, SUGGEST RECOLLECTION (A)  Final   Report Status 05/31/2015 FINAL  Final  MRSA PCR Screening     Status: Abnormal   Collection Time: 05/30/15  2:54 PM  Result Value Ref Range Status   MRSA by PCR POSITIVE (A) NEGATIVE Final    Comment:        The GeneXpert MRSA Assay (FDA approved for NASAL specimens only), is one component of a comprehensive MRSA colonization surveillance program. It is not intended to diagnose MRSA infection nor to guide or monitor treatment for MRSA infections. RESULT CALLED TO, READ BACK BY AND VERIFIED WITH: EDGAR,E @ [id number] ON [id number] BY POTEAT,S   Wound culture     Status: None (Preliminary result)   Collection Time: 05/30/15  4:46 PM  Result Value Ref Range Status   Specimen Description WOUND RIGHT ABDOMEN  Final   Special Requests  NONE  Final   Gram Stain   Final    MODERATE WBC PRESENT,BOTH PMN AND MONONUCLEAR NO SQUAMOUS EPITHELIAL CELLS SEEN NO ORGANISMS SEEN Performed at [facility]    Culture PENDING  Incomplete   Report Status PENDING  Incomplete     Studies: No results found.  Scheduled Meds: . amiodarone  100 mg Oral Daily  . Chlorhexidine Gluconate Cloth  6 each Topical Q0600  . diltiazem  240 mg Oral Daily  . imipenem-cilastatin  250 mg Intravenous Q12H  . insulin aspart  0-9 Units Subcutaneous TID WC  . insulin glargine  25  Units Subcutaneous QHS  . [START ON 06/01/2015] insulin glargine  30 Units Subcutaneous Daily  . levothyroxine  137 mcg Oral QAC breakfast  . linezolid (ZYVOX) IV  600 mg Intravenous Q12H  . loratadine  10 mg Oral Daily  . metoprolol  25 mg Oral BID  . mupirocin ointment  1 application Nasal BID  . rivaroxaban  15 mg Oral Q supper  . simvastatin  20 mg Oral Q2000    Continuous Infusions:    Time spent: 21mins  Vaiden Adames MD, PhD  [facility] Pager [phone]. If 7PM-7AM, please contact night-coverage at [email or web address], password [facility] 05/31/2015, 6:10 PM  LOS: 1 day

## 2015-05-31 NOTE — Progress Notes (Signed)
Hypoglycemic Event  CBG:   Time:  0527             Result:  65  Treatment: 225mL OJ  Symptoms: per pt "I start seeing a white doughnut shape in my eyes when my sugar drops below 100"  Follow-up CBG:    Time: 0558       Result: 71  Possible Reasons for Event: unknown cause  Comments: Episode resolved    Fleet Contras

## 2015-05-31 NOTE — Progress Notes (Signed)
ANTICOAGULATION CONSULT NOTE - Follow Up Consult  Pharmacy Consult for Heparin Indication: Atrial Fibrillation  Allergies  Allergen Reactions  . Adhesive [Tape] Itching, Swelling, Rash and Other (See Comments)    Tears skin and causes blisters also. EKG pads will cause welps.   [name] Flavor Anaphylaxis  . Cefprozil Shortness Of Breath and Rash  . Dicyclomine Nausea And Vomiting and Other (See Comments)    "Heart trouble"; Headaches and increased blood sugars  . Food Anaphylaxis and Other (See Comments)    Melons, Bananas, Cantaloupes, Watermelon-throat closes up and blisters   . Imdur [Isosorbide Nitrate] Hives, Palpitations and Other (See Comments)    Headaches also  . Januvia [Sitagliptin] Shortness Of Breath  . Lipitor [Atorvastatin] Shortness Of Breath  . Losartan Potassium Shortness Of Breath  . Nitroglycerin Other (See Comments)    Caused cardiac arrest and feels like skin bring torn off back of head  . Penicillins Anaphylaxis    Has patient had a PCN reaction causing immediate rash, facial/tongue/throat swelling, SOB or lightheadedness with hypotension: Yes Has patient had a PCN reaction causing severe rash involving mucus membranes or skin necrosis: No Has patient had a PCN reaction that required hospitalization Yes Has patient had a PCN reaction occurring within the last 10 years: No If all of the above answers are "NO", then may proceed with Cephalosporin use.   . Prednisone Anaphylaxis  . Vancomycin Anaphylaxis  . Cetacaine [Butamben-Tetracaine-Benzocaine] Nausea And Vomiting and Swelling  . Hydrocodone Hives  . Latex Other (See Comments)    blisters  . Oxycodone Hives  . Tamiflu [Oseltamivir] Other (See Comments)    Patient on tikosyn, and tamiflu interfered with anti arrhythmic med  . Lasix [Furosemide] Hives and Swelling  . Avelox [Moxifloxacin] Swelling and Rash    Patient Measurements: Height: 5\' 2"  (157.5 cm) Weight: 221 lb 12.5 oz (100.6 kg) IBW/kg  (Calculated) : 50.1 Heparin Dosing Weight: 74  Vital Signs: Temp: 98.1 F (36.7 C) (05/05 0504) Temp Source: Oral (05/05 0504) BP: 106/43 mmHg (05/05 0504) Pulse Rate: 60 (05/05 0504)  Labs:  Recent Labs  05/29/15 2051  05/30/15 0545 05/30/15 1411 05/30/15 2235 05/31/15 0538  HGB 11.8*  --  10.3* 10.2*  --  10.1*  HCT 38.7  --  34.1* 33.2*  --  33.0*  PLT 304  --  278 269  --  259  APTT  --   < > 36 59* 72* 66*  LABPROT  --   --  19.8*  --   --   --   INR  --   --  1.75*  --   --   --   HEPARINUNFRC  --   < > 1.56* 1.03* 0.79* 0.64  CREATININE 3.32*  --  3.30*  --   --  3.11*  < > = values in this interval not displayed.  Estimated Creatinine Clearance: 18.1 mL/min (by C-G formula based on Cr of 3.11).    Assessment:  35yr female on Xarelto 15mg  daily (last dose 5/2 at 2030) PTA for AFib. 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 To ED with purulence at suture site, no acute abd abscess on CT.  Pharmacy dosing Primaxin for treatment of abdominal nephrectomy wound with area of cellulitis  Pharmacy consulted to dose IV heparin for afib, Obtained baseline aPTT, INR, HL, CBC  Xarelto will falsely elevate heparin level, so will have to monitor aPTT along with heparin level until both levels correlate  at which point only heparin level will need to be monitored.  5/4 at 1400: 1st Heparin level 1.03, aPTT 59 sec. 5/4 at 2235 aPTT 72 sec. 5/5 at 0538 aPTT 66 sec and Heparin level 0.64.   Goal of Therapy:  Heparin level 0.3-0.7 units/mL Monitor platelets by anticoagulation protocol: Yes   Plan:  1. Continue IV heparain @ 1100 units/hr 2. Repeat aPTT and heparin level daily 3. Follow CBC dialy 4. Consider switching to rivaroxaban 15 mg PO daily  [name] 05/31/2015,12:43 PM

## 2015-05-31 NOTE — Progress Notes (Signed)
Inpatient Diabetes Program Recommendations  AACE/ADA: New Consensus Statement on Inpatient Glycemic Control (2015)  Target Ranges:  Prepandial:   less than 140 mg/dL      Peak postprandial:   less than 180 mg/dL (1-2 hours)      Critically ill patients:  140 - 180 mg/dL   Results for [name], [name] (MRN [id number]) as of 05/31/2015 09:31  Ref. Range 05/31/2015 05:27 05/31/2015 05:57 05/31/2015 09:06  Glucose-Capillary Latest Ref Range: 65-99 mg/dL 65 71 50    Admit with: Cellulitis of Abdominal Wall  History: DM, CHF, CKD  Home DM Meds: Lantus 35 units AM/ 30 units PM (25 units if CBG <100 at 10 pm)       Glipizide 10 mg daily  Current Insulin Orders: Lantus 35 units AM/ 30 units PM      Novolog Sensitive Correction Scale/ SSI (0-9 units) TID AC     MD- Note patient with mild Hypoglycemia this AM (CBG 65 mg/dl).  Please consider reducing Lantus doses slightly to Lantus 30 units AM and Lantus 25 units QHS     --Will follow patient during hospitalization--  Wyn Quaker RN, MSN, CDE Diabetes Coordinator Inpatient Glycemic Control Team Team Pager: [phone] (8a-5p)

## 2015-06-01 ENCOUNTER — Inpatient Hospital Stay (HOSPITAL_COMMUNITY): Payer: Medicare HMO

## 2015-06-01 DIAGNOSIS — I480 Paroxysmal atrial fibrillation: Secondary | ICD-10-CM

## 2015-06-01 LAB — CBC
HCT: 33.3 % — ABNORMAL LOW (ref 36.0–46.0)
Hemoglobin: 10.2 g/dL — ABNORMAL LOW (ref 12.0–15.0)
MCH: 27 pg (ref 26.0–34.0)
MCHC: 30.6 g/dL (ref 30.0–36.0)
MCV: 88.1 fL (ref 78.0–100.0)
PLATELETS: 253 10*3/uL (ref 150–400)
RBC: 3.78 MIL/uL — AB (ref 3.87–5.11)
RDW: 18.9 % — ABNORMAL HIGH (ref 11.5–15.5)
WBC: 9.1 10*3/uL (ref 4.0–10.5)

## 2015-06-01 LAB — GLUCOSE, CAPILLARY
GLUCOSE-CAPILLARY: 61 mg/dL — AB (ref 65–99)
GLUCOSE-CAPILLARY: 63 mg/dL — AB (ref 65–99)
GLUCOSE-CAPILLARY: 111 mg/dL — AB (ref 65–99)
Glucose-Capillary: 106 mg/dL — ABNORMAL HIGH (ref 65–99)
Glucose-Capillary: 108 mg/dL — ABNORMAL HIGH (ref 65–99)
Glucose-Capillary: 93 mg/dL (ref 65–99)

## 2015-06-01 LAB — BASIC METABOLIC PANEL
Anion gap: 9 (ref 5–15)
BUN: 21 mg/dL — ABNORMAL HIGH (ref 6–20)
CALCIUM: 10.3 mg/dL (ref 8.9–10.3)
CO2: 26 mmol/L (ref 22–32)
CREATININE: 2.92 mg/dL — AB (ref 0.44–1.00)
Chloride: 103 mmol/L (ref 101–111)
GFR calc non Af Amer: 15 mL/min — ABNORMAL LOW (ref >60)
GFR, EST AFRICAN AMERICAN: 17 mL/min — AB (ref >60)
Glucose, Bld: 62 mg/dL — ABNORMAL LOW (ref 65–99)
Potassium: 4.3 mmol/L (ref 3.5–5.1)
SODIUM: 138 mmol/L (ref 135–145)

## 2015-06-01 MED ORDER — TORSEMIDE 20 MG PO TABS
40.0000 mg | ORAL_TABLET | Freq: Two times a day (BID) | ORAL | Status: DC
  Administered 2015-06-01 – 2015-06-02 (×2): 40 mg via ORAL
  Filled 2015-06-01 (×4): qty 2

## 2015-06-01 MED ORDER — ESTRADIOL 1 MG PO TABS
1.0000 mg | ORAL_TABLET | Freq: Every day | ORAL | Status: DC
  Administered 2015-06-01 – 2015-06-02 (×2): 1 mg via ORAL
  Filled 2015-06-01 (×2): qty 1

## 2015-06-01 MED ORDER — INSULIN GLARGINE 100 UNIT/ML ~~LOC~~ SOLN
20.0000 [IU] | Freq: Every day | SUBCUTANEOUS | Status: DC
  Filled 2015-06-01: qty 0.2

## 2015-06-01 MED ORDER — DOXYCYCLINE HYCLATE 100 MG PO TABS
100.0000 mg | ORAL_TABLET | Freq: Two times a day (BID) | ORAL | Status: DC
  Administered 2015-06-01 – 2015-06-02 (×3): 100 mg via ORAL
  Filled 2015-06-01 (×4): qty 1

## 2015-06-01 NOTE — Progress Notes (Signed)
PROGRESS NOTE  [name] [id number] DOB: 08-06-1940 DOA: 05/29/2015 PCP: Octavio Graves, DO  HPI/Recap of past 24 hours:   hypoglycemia episodes again this am with reduced does insulin  wound stopped draining, no fever, no pain, no leukocytosis, but reported nausea/diarrhea  Assessment/Plan: Principal Problem:   Cellulitis, abdominal wall Active Problems:   Hypothyroidism   HTN (hypertension)   Persistent atrial fibrillation (HCC)   CKD (chronic kidney disease), stage III   Cellulitis   UTI (lower urinary tract infection)   Diabetes mellitus with renal manifestation ([place])  #1. Cellulitis of the abdominal wall involving the right lower quadrant at the area of the incision site for nephrectomy.  She was started on clindamycin and Primaxin then switched to zyvox and primaxin.  Wound culture/ blood cultures no growth so far. Urology Dr Tresa Moore input appreciated, change abx to doxycyclin. #2. Possible UTI - patient on empiric antibiotics,  urine cultures inconclusive. Had been on abx. #3. Acute on chronic kidney disease stage IV - s/p Gently hydration. patient's diuretics was held since admission. Cr better, restart home meds torsemide on 5/6  #4. Persistent atrial fibrillation status post ablation - patient is on amiodarone, Cardizem and lopressor Which are continued. Since patient has some bleeding from the site involving the cellulitis,  xarelto was held on 5/4 and she was put on heparin drip. xarelto restarted on 5/5 now that the wound stopped bleeding.  Patient's chads 2 vasc score is more than 2. #5. Chronic anemia - CBC  stable. #6. Diabetes mellitus type 2 - on Lantus insulin with sliding scale coverage. Had hypoglycemic episodes on decreased dose lantus compare to home regimen, not sure if patient is compliant with meds or diet at home, will need to further cut down lantus dose, worsening renal function  could also contribute. But persistent hypoglycemia episodes on improved  cr. #7. Hypothyroidism on Synthroid. #8. Hyperlipidemia on statins. #9 obesity: life style changes #10 reported n/v/d on 5/6, c diff ordered, but per RN patient has semi formed stool, kub pending, diabetic gastroparesis?    DVT prophylaxis: Heparin infusion from admission to 5/5, xarelto restarted on 5/5 Code Status: Full code.  Family Communication: [name].  Disposition Plan: Home in 1-2 days, pending culture result    Consultants: Urology Dr Tresa Moore  Procedures:  none  Antibiotics:  linezolid from 5/5 to 5/6  primaxin from admission to 5/6  Doxycycline from 5/6   Objective: BP 132/67 mmHg  Pulse 60  Temp(Src) 97.8 F (36.6 C) (Oral)  Resp 17  Ht 5\' 2"  (1.575 m)  Wt 100.6 kg (221 lb 12.5 oz)  BMI 40.55 kg/m2  SpO2 98%  Intake/Output Summary (Last 24 hours) at 06/01/15 1316 Last data filed at 06/01/15 0901  Gross per 24 hour  Intake    240 ml  Output      0 ml  Net    240 ml   Filed Weights   05/30/15 0100 05/31/15 0500 06/01/15 0504  Weight: 97.7 kg (215 lb 6.2 oz) 100.6 kg (221 lb 12.5 oz) 100.6 kg (221 lb 12.5 oz)    Exam:   General:  NAD, obese  Cardiovascular: RRR  Respiratory: CTABL  Abdomen: Soft/ND/NT, positive BS  Musculoskeletal: No Edema  Neuro: aaox3  Skin: RLQ old extraction site with induration and small opening in the center with minimal drainage   Data Reviewed: Basic Metabolic Panel:  Recent Labs Lab 05/29/15 2051 05/30/15 0545 05/31/15 0538 06/01/15 0541  NA 137 141 140  138  K 3.8 3.5 3.8 4.3  CL 92* 98* 105 103  CO2 29 30 26 26   GLUCOSE 129* 94 71 62*  BUN 28* 28* 23* 21*  CREATININE 3.32* 3.30* 3.11* 2.92*  CALCIUM 10.9* 10.0 9.8 10.3   Liver Function Tests:  Recent Labs Lab 05/29/15 2051 05/30/15 0545 05/31/15 0538  AST 48* 44* 30  ALT 22 20 16   ALKPHOS 45 39 33*  BILITOT 0.7 0.5 0.6  PROT 7.5 6.5 6.1*  ALBUMIN 3.8 3.3* 3.0*   No results for input(s): LIPASE, AMYLASE in the last 168  hours. No results for input(s): AMMONIA in the last 168 hours. CBC:  Recent Labs Lab 05/29/15 2051 05/30/15 0545 05/30/15 1411 05/31/15 0538 06/01/15 0541  WBC 9.0 8.1 6.9 8.4 9.1  NEUTROABS 4.2 3.7  --   --   --   HGB 11.8* 10.3* 10.2* 10.1* 10.2*  HCT 38.7 34.1* 33.2* 33.0* 33.3*  MCV 88.2 87.4 87.8 87.8 88.1  PLT 304 278 269 259 253   Cardiac Enzymes:   No results for input(s): CKTOTAL, CKMB, CKMBINDEX, TROPONINI in the last 168 hours. BNP (last 3 results)  Recent Labs  07/27/14 1341  BNP 186.3*    ProBNP (last 3 results) No results for input(s): PROBNP in the last 8760 hours.  CBG:  Recent Labs Lab 05/31/15 1625 06/01/15 0735 06/01/15 0824 06/01/15 0908 06/01/15 1144  GLUCAP 122* 61* 63* 106* 108*    Recent Results (from the past 240 hour(s))  Blood culture (routine x 2)     Status: None (Preliminary result)   Collection Time: 05/29/15  8:51 PM  Result Value Ref Range Status   Specimen Description BLOOD BLOOD RIGHT HAND  Final   Special Requests BOTTLES DRAWN AEROBIC AND ANAEROBIC 5CC EA  Final   Culture   Final    NO GROWTH 2 DAYS Performed at [facility]    Report Status PENDING  Incomplete  Blood culture (routine x 2)     Status: None (Preliminary result)   Collection Time: 05/29/15  8:53 PM  Result Value Ref Range Status   Specimen Description BLOOD LEFT ANTECUBITAL  Final   Special Requests IN PEDIATRIC BOTTLE [place]  Final   Culture   Final    NO GROWTH 2 DAYS Performed at [facility]    Report Status PENDING  Incomplete  Urine culture     Status: Abnormal   Collection Time: 05/29/15 10:50 PM  Result Value Ref Range Status   Specimen Description URINE, CLEAN CATCH  Final   Special Requests NONE  Final   Culture MULTIPLE SPECIES PRESENT, SUGGEST RECOLLECTION (A)  Final   Report Status 05/31/2015 FINAL  Final  MRSA PCR Screening     Status: Abnormal   Collection Time: 05/30/15  2:54 PM  Result Value Ref Range Status   MRSA  by PCR POSITIVE (A) NEGATIVE Final    Comment:        The GeneXpert MRSA Assay (FDA approved for NASAL specimens only), is one component of a comprehensive MRSA colonization surveillance program. It is not intended to diagnose MRSA infection nor to guide or monitor treatment for MRSA infections. RESULT CALLED TO, READ BACK BY AND VERIFIED WITH: EDGAR,E @ [id number] ON [id number] BY POTEAT,S   Wound culture     Status: None (Preliminary result)   Collection Time: 05/30/15  4:46 PM  Result Value Ref Range Status   Specimen Description WOUND RIGHT ABDOMEN  Final   Special  Requests NONE  Final   Gram Stain   Final    MODERATE WBC PRESENT,BOTH PMN AND MONONUCLEAR NO SQUAMOUS EPITHELIAL CELLS SEEN NO ORGANISMS SEEN Performed at [facility]    Culture   Final    Culture reincubated for better growth Performed at [facility]    Report Status PENDING  Incomplete     Studies: No results found.  Scheduled Meds: . amiodarone  100 mg Oral Daily  . Chlorhexidine Gluconate Cloth  6 each Topical Q0600  . diltiazem  240 mg Oral Daily  . doxycycline  100 mg Oral Q12H  . estradiol  1 mg Oral Daily  . imipenem-cilastatin  250 mg Intravenous Q12H  . insulin aspart  0-9 Units Subcutaneous TID WC  . insulin glargine  20 Units Subcutaneous QHS  . [START ON 06/02/2015] insulin glargine  20 Units Subcutaneous Daily  . levothyroxine  137 mcg Oral QAC breakfast  . loratadine  10 mg Oral Daily  . metoprolol  25 mg Oral BID  . mupirocin ointment  1 application Nasal BID  . rivaroxaban  15 mg Oral Q supper  . simvastatin  20 mg Oral Q2000  . torsemide  40 mg Oral BID    Continuous Infusions:    Time spent: 32mins  Charon Smedberg MD, PhD  [facility] Pager [phone]. If 7PM-7AM, please contact night-coverage at [email or web address], password [facility] 06/01/2015, 1:16 PM  LOS: 2 days

## 2015-06-02 LAB — GLUCOSE, CAPILLARY
GLUCOSE-CAPILLARY: 82 mg/dL (ref 65–99)
GLUCOSE-CAPILLARY: 84 mg/dL (ref 65–99)

## 2015-06-02 LAB — COMPREHENSIVE METABOLIC PANEL
ALBUMIN: 3.3 g/dL — AB (ref 3.5–5.0)
ALK PHOS: 38 U/L (ref 38–126)
ALT: 13 U/L — ABNORMAL LOW (ref 14–54)
ANION GAP: 11 (ref 5–15)
AST: 23 U/L (ref 15–41)
BILIRUBIN TOTAL: 0.6 mg/dL (ref 0.3–1.2)
BUN: 19 mg/dL (ref 6–20)
CALCIUM: 11 mg/dL — AB (ref 8.9–10.3)
CO2: 28 mmol/L (ref 22–32)
CREATININE: 2.92 mg/dL — AB (ref 0.44–1.00)
Chloride: 101 mmol/L (ref 101–111)
GFR, EST AFRICAN AMERICAN: 17 mL/min — AB (ref >60)
GFR, EST NON AFRICAN AMERICAN: 15 mL/min — AB (ref >60)
Glucose, Bld: 92 mg/dL (ref 65–99)
Potassium: 4.6 mmol/L (ref 3.5–5.1)
Sodium: 140 mmol/L (ref 135–145)
TOTAL PROTEIN: 6.4 g/dL — AB (ref 6.5–8.1)

## 2015-06-02 MED ORDER — SENNOSIDES-DOCUSATE SODIUM 8.6-50 MG PO TABS: 1.0000 | ORAL_TABLET | Freq: Every day | ORAL | Status: DC

## 2015-06-02 MED ORDER — SENNOSIDES-DOCUSATE SODIUM 8.6-50 MG PO TABS
2.0000 | ORAL_TABLET | Freq: Two times a day (BID) | ORAL | Status: DC
  Administered 2015-06-02: 2 via ORAL
  Filled 2015-06-02: qty 2

## 2015-06-02 MED ORDER — ACETAMINOPHEN 500 MG PO TABS: 500.0000 mg | ORAL_TABLET | Freq: Four times a day (QID) | ORAL | Status: DC | PRN

## 2015-06-02 MED ORDER — DOXYCYCLINE HYCLATE 100 MG PO TABS: 100.0000 mg | ORAL_TABLET | Freq: Two times a day (BID) | ORAL | Status: DC

## 2015-06-02 NOTE — Progress Notes (Signed)
Went over d/c instructions with patient.  Patient verbalized understanding.  Patient left hospital via w/c and personal vehicle with personal oxygen, clothes, and d/c instructions. Virginia Rochester, RN

## 2015-06-02 NOTE — Discharge Summary (Signed)
Discharge Summary  [name] [id number] DOB: 1940/12/12  PCP: Octavio Graves, DO  Admit date: 05/29/2015 Discharge date: 06/02/2015  Time spent: <18mins  Recommendations for Outpatient Follow-up:  1. F/u with PMD within a week  for hospital discharge follow up, repeat cbc/bmp at follow up 2. F/u with urology Dr Tresa Moore in two weeks for post op abdominal wound  Discharge Diagnoses:  Active Hospital Problems   Diagnosis Date Noted  . Cellulitis, abdominal wall 05/30/2015  . UTI (lower urinary tract infection) 05/30/2015  . Diabetes mellitus with renal manifestation ([place]) 05/30/2015  . Cellulitis 05/30/2015  . CKD (chronic kidney disease), stage III   . Persistent atrial fibrillation ([place])   . HTN (hypertension)   . Hypothyroidism 02/07/2009    Resolved Hospital Problems   Diagnosis Date Noted Date Resolved  No resolved problems to display.    Discharge Condition: stable  Diet recommendation: heart healthy/carb modified  Filed Weights   05/31/15 0500 06/01/15 0504 06/02/15 0647  Weight: 100.6 kg (221 lb 12.5 oz) 100.6 kg (221 lb 12.5 oz) 97.4 kg (214 lb 11.7 oz)    History of present illness:  Chief Complaint: Right lower abdominal wall discharge and bleeding.  HPI: [name] is a 75 y.o. female with medical history significant of with history of persistent atrial fibrillation on amiodarone and xarelto, CHF, diabetes mellitus, hypothyroidism, chronic kidney disease presents to the ER because patient started noticing some bleeding from the right lower quadrant abdominal wall since yesterday morning. Patient noticed a bleb initially which ruptured and had bloody discharge. ER physician noticed some pus in the discharge. CT of the abdomen shows gas filled stranding with no abuse abscess in the wall and also in the nephrectomy bed area showing stranding. 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  Patient states since the surgery patient has been having some discharge and oozing from the site. Had some fever chills denies any nausea vomiting or diarrhea or chest pain or shortness of breath. Patient's lactic acid was elevated improving with fluids. Patient will be admitted for cellulitis of the abdominal wall. Lesion patient also has possible UTI.   ED Course: Patient was given fluid bolus and started on empiric antibiotics.   Hospital Course:  Principal Problem:   Cellulitis, abdominal wall Active Problems:   Hypothyroidism   HTN (hypertension)   Persistent atrial fibrillation ([facility])   CKD (chronic kidney disease), stage III   Cellulitis   UTI (lower urinary tract infection)   Diabetes mellitus with renal manifestation ([place])  #1. Cellulitis of the abdominal wall involving the right lower quadrant at the area of the incision site for nephrectomy. She was started on clindamycin and Primaxin then switched to zyvox and primaxin.  Wound culture/ blood cultures no growth so far. Urology Dr Tresa Moore input appreciated, change abx to doxycyclin. She is discharged on doxycyclin and close follow up with Dr Tresa Moore. #2. Possible UTI - patient on empiric antibiotics, urine cultures inconclusive. Had been on abx. #3. Acute on chronic kidney disease stage IV - s/p Gently hydration. patient's diuretics was held since admission. Cr better, restart home meds torsemide on 5/6  #4. Persistent atrial fibrillation status post ablation - patient is on amiodarone, Cardizem and lopressor Which are continued. Since patient has some bleeding from the site involving the cellulitis, xarelto was held on 5/4 and she was put on heparin drip. xarelto restarted on 5/5 now that the  wound stopped bleeding. Patient's chads 2 vasc score is more than 2. #5. Chronic anemia - CBC stable. #6. Insulin dependent Diabetes mellitus type 2 - a1c 7.4 in 03/2015,  Had hypoglycemic episodes on decreased dose lantus compare to home  regimen, not sure if patient is compliant with meds or diet at home,  worsening renal function could also contribute. But persistent hypoglycemia episodes on improved cr. Patient states she understand to adjust her insulin according to her blood sugar reading. She is advised to close follow up with her diabetes physician. #7. Hypothyroidism on Synthroid. #8. Hyperlipidemia on statins. #9 obesity: life style changes #10 reported n/v/d on 5/6, c diff ordered, but per RN patient has semi formed stool, kub showed constipation, stool softener, symptom resolved, could also has component of  diabetic gastroparesis.    DVT prophylaxis: Heparin infusion from admission to 5/5, xarelto restarted on 5/5 Code Status: Full code.  Family Communication: [name].  Disposition Plan: Home on 5/7    Consultants: Urology Dr Tresa Moore  Procedures:  none  Antibiotics:  linezolid from 5/5 to 5/6  primaxin from admission to 5/6  Doxycycline from 5/6   Discharge Exam: BP 115/52 mmHg  Pulse 54  Temp(Src) 98.2 F (36.8 C) (Oral)  Resp 20  Ht 5\' 2"  (1.575 m)  Wt 97.4 kg (214 lb 11.7 oz)  BMI 39.26 kg/m2  SpO2 98%    General: NAD, obese  Cardiovascular: RRR  Respiratory: CTABL  Abdomen: Soft/ND/NT, positive BS  Musculoskeletal: No Edema  Neuro: aaox3  Skin: RLQ old extraction site with induration and small opening in the center with minimal drainage   Discharge Instructions You were cared for by a hospitalist during your hospital stay. If you have any questions about your discharge medications or the care you received while you were in the hospital after you are discharged, you can call the unit and asked to speak with the hospitalist on call if the hospitalist that took care of you is not available. Once you are discharged, your primary care physician will handle any further medical issues. Please note that NO REFILLS for any discharge medications will be authorized once you  are discharged, as it is imperative that you return to your primary care physician (or establish a relationship with a primary care physician if you do not have one) for your aftercare needs so that they can reassess your need for medications and monitor your lab values.  Discharge Instructions    Diet - low sodium heart healthy    Complete by:  As directed   Carb modified     Increase activity slowly    Complete by:  As directed             Medication List    TAKE these medications        ACCU-CHEK SMARTVIEW test strip  Generic drug:  glucose blood  1 each by Other route See admin instructions. Check blood sugar 3-4 times daily     acetaminophen 500 MG tablet  Commonly known as:  TYLENOL  Take 1 tablet (500 mg total) by mouth every 6 (six) hours as needed (pain).     amiodarone 200 MG tablet  Commonly known as:  PACERONE  Take 0.5 tablets (100 mg total) by mouth daily.     cetirizine 10 MG tablet  Commonly known as:  ZYRTEC  Take 5-10 mg by mouth daily as needed for allergies.     clonazePAM 1 MG tablet  Commonly known  as:  KLONOPIN  Take 0.5-1 mg by mouth 3 (three) times daily as needed for anxiety.     diltiazem 240 MG 24 hr capsule  Commonly known as:  CARDIZEM CD  Take 1 capsule (240 mg total) by mouth daily.     diphenhydrAMINE 25 MG tablet  Commonly known as:  BENADRYL  Take 25 mg by mouth every 6 (six) hours as needed for itching or allergies.     doxycycline 100 MG tablet  Commonly known as:  VIBRA-TABS  Take 1 tablet (100 mg total) by mouth 2 (two) times daily.     estradiol 1 MG tablet  Commonly known as:  ESTRACE  Take 1 mg by mouth daily.     glipiZIDE 10 MG 24 hr tablet  Commonly known as:  GLUCOTROL XL  Take 10 mg by mouth daily with breakfast.     Insulin Glargine 100 UNIT/ML Solostar Pen  Commonly known as:  LANTUS  Inject 30-35 Units into the skin 2 (two) times daily. Takes 35units in the AM and 30units in the PM. 25 units if cbg less than  100 at 10 pm     levothyroxine 137 MCG tablet  Commonly known as:  SYNTHROID, LEVOTHROID  Take 137 mcg by mouth daily before breakfast.     metoprolol 50 MG tablet  Commonly known as:  LOPRESSOR  Take 25 mg by mouth 2 (two) times daily.     Rivaroxaban 15 MG Tabs tablet  Commonly known as:  XARELTO  Take 1 tablet (15 mg total) by mouth daily with supper.     senna-docusate 8.6-50 MG tablet  Commonly known as:  Senokot-S  Take 1 tablet by mouth at bedtime.     simethicone 125 MG chewable tablet  Commonly known as:  MYLICON  Chew 0000000 mg by mouth every 6 (six) hours as needed for flatulence.     simvastatin 20 MG tablet  Commonly known as:  ZOCOR  Take 20 mg by mouth daily at 8 pm.     torsemide 20 MG tablet  Commonly known as:  DEMADEX  Take 40 mg by mouth 2 (two) times daily.     VENTOLIN HFA IN  Inhale 90 mcg into the lungs every 4 (four) hours.       Allergies  Allergen Reactions  . Adhesive [Tape] Itching, Swelling, Rash and Other (See Comments)    Tears skin and causes blisters also. EKG pads will cause welps.   [name] Flavor Anaphylaxis  . Cefprozil Shortness Of Breath and Rash  . Dicyclomine Nausea And Vomiting and Other (See Comments)    "Heart trouble"; Headaches and increased blood sugars  . Food Anaphylaxis and Other (See Comments)    Melons, Bananas, Cantaloupes, Watermelon-throat closes up and blisters   . Imdur [Isosorbide Nitrate] Hives, Palpitations and Other (See Comments)    Headaches also  . Januvia [Sitagliptin] Shortness Of Breath  . Lipitor [Atorvastatin] Shortness Of Breath  . Losartan Potassium Shortness Of Breath  . Nitroglycerin Other (See Comments)    Caused cardiac arrest and feels like skin bring torn off back of head  . Penicillins Anaphylaxis    Has patient had a PCN reaction causing immediate rash, facial/tongue/throat swelling, SOB or lightheadedness with hypotension: Yes Has patient had a PCN reaction causing severe rash  involving mucus membranes or skin necrosis: No Has patient had a PCN reaction that required hospitalization Yes Has patient had a PCN reaction occurring within the last 10 years: No  If all of the above answers are "NO", then may proceed with Cephalosporin use.   . Prednisone Anaphylaxis  . Vancomycin Anaphylaxis  . Cetacaine [Butamben-Tetracaine-Benzocaine] Nausea And Vomiting and Swelling  . Hydrocodone Hives  . Latex Other (See Comments)    blisters  . Oxycodone Hives  . Tamiflu [Oseltamivir] Other (See Comments)    Patient on tikosyn, and tamiflu interfered with anti arrhythmic med  . Lasix [Furosemide] Hives and Swelling  . Avelox [Moxifloxacin] Swelling and Rash       Follow-up Information    Follow up with CYNTHIA BUTLER, DO In 1 week.   Why:  hospital discharge follow up, repeat cbc/bmp at follow up   Contact information:   [address] [address] [phone]       Follow up with Alexis Frock, MD In 2 weeks.   Specialty:  Urology   Why:  post op abdominal wound   Contact information:   [place] [address] [phone]        The results of significant diagnostics from this hospitalization (including imaging, microbiology, ancillary and laboratory) are listed below for reference.    Significant Diagnostic Studies: Ct Abdomen Pelvis Wo Contrast  05/29/2015  CLINICAL DATA:  Blood CP Ing from surgical incision site. Nephrectomy on 02/20/2015. EXAM: CT ABDOMEN AND PELVIS WITHOUT CONTRAST TECHNIQUE: Multidetector CT imaging of the abdomen and pelvis was performed following the standard protocol without IV contrast. COMPARISON:  10/25/2014 FINDINGS: There is soft tissue stranding and a bubble of soft tissue air in the right lower quadrant abdominal wall, axial image 79 series 2 and coronal image 34 series 5. No drainable hematoma or abscess. Mild stranding opacity in the right nephrectomy bed, without discrete drainable collection. There are  unremarkable unenhanced appearances of the liver, bile ducts, pancreas, spleen, adrenals and left kidney. Bowel is unremarkable. The abdominal aorta is normal in caliber and heavily calcified. No significant abnormalities evident in the lower chest. Mild unchanged linear scarring in the bases. There is no significant skeletal lesion. IMPRESSION: Stranding opacity and superficial soft tissue gas in the right lower quadrant abdominal wall, without drainable collection. There also is mild stranding opacity in the right nephrectomy bed, without drainable collection. Electronically Signed   By: Andreas Newport M.D.   On: 05/29/2015 22:32   Dg Chest 2 View  05/29/2015  CLINICAL DATA:  Bleeding from surgical infection. History of kidney cancer, atrial fibrillation, hypertension and asthma. EXAM: CHEST  2 VIEW COMPARISON:  02/14/2015; 10/23/2014 FINDINGS: Grossly unchanged enlarged cardiac silhouette and mediastinal contours with atherosclerotic plaque within the thoracic aorta. No focal parenchymal opacities. No pleural effusion or pneumothorax. No evidence of edema. No acute osseus abnormalities. IMPRESSION: Cardiomegaly without acute cardiopulmonary disease. Electronically Signed   By: Sandi Mariscal M.D.   On: 05/29/2015 21:14   Dg Abd 1 View  06/01/2015  CLINICAL DATA:  Nausea and vomiting EXAM: ABDOMEN - 1 VIEW COMPARISON:  CT abdomen and pelvis May 29, 2015 FINDINGS: There is no bowel dilatation or air-fluid level suggesting obstruction. No free air. Lung bases are clear. There is moderate stool in the colon. There are small phleboliths in the pelvis. IMPRESSION: No demonstrable bowel obstruction or free air. Lung bases are clear. Electronically Signed   By: Lowella Grip III M.D.   On: 06/01/2015 13:36    Microbiology: Recent Results (from the past 240 hour(s))  Blood culture (routine x 2)     Status: None (Preliminary result)  Collection Time: 05/29/15  8:51 PM  Result Value Ref Range Status    Specimen Description BLOOD BLOOD RIGHT HAND  Final   Special Requests BOTTLES DRAWN AEROBIC AND ANAEROBIC 5CC EA  Final   Culture   Final    NO GROWTH 3 DAYS Performed at [facility]    Report Status PENDING  Incomplete  Blood culture (routine x 2)     Status: None (Preliminary result)   Collection Time: 05/29/15  8:53 PM  Result Value Ref Range Status   Specimen Description BLOOD LEFT ANTECUBITAL  Final   Special Requests IN PEDIATRIC BOTTLE [place]  Final   Culture   Final    NO GROWTH 3 DAYS Performed at [facility]    Report Status PENDING  Incomplete  Urine culture     Status: Abnormal   Collection Time: 05/29/15 10:50 PM  Result Value Ref Range Status   Specimen Description URINE, CLEAN CATCH  Final   Special Requests NONE  Final   Culture MULTIPLE SPECIES PRESENT, SUGGEST RECOLLECTION (A)  Final   Report Status 05/31/2015 FINAL  Final  MRSA PCR Screening     Status: Abnormal   Collection Time: 05/30/15  2:54 PM  Result Value Ref Range Status   MRSA by PCR POSITIVE (A) NEGATIVE Final    Comment:        The GeneXpert MRSA Assay (FDA approved for NASAL specimens only), is one component of a comprehensive MRSA colonization surveillance program. It is not intended to diagnose MRSA infection nor to guide or monitor treatment for MRSA infections. RESULT CALLED TO, READ BACK BY AND VERIFIED WITH: EDGAR,E @ 1627 ON [id number] BY POTEAT,S   Wound culture     Status: None (Preliminary result)   Collection Time: 05/30/15  4:46 PM  Result Value Ref Range Status   Specimen Description WOUND RIGHT ABDOMEN  Final   Special Requests NONE  Final   Gram Stain   Final    MODERATE WBC PRESENT,BOTH PMN AND MONONUCLEAR NO SQUAMOUS EPITHELIAL CELLS SEEN NO ORGANISMS SEEN Performed at [facility]    Culture   Final    Culture reincubated for better growth Performed at [facility]    Report Status PENDING  Incomplete     Labs: Basic Metabolic  Panel:  Recent Labs Lab 05/29/15 2051 05/30/15 0545 05/31/15 0538 06/01/15 0541 06/02/15 0523  NA 137 141 140 138 140  K 3.8 3.5 3.8 4.3 4.6  CL 92* 98* 105 103 101  CO2 29 30 26 26 28   GLUCOSE 129* 94 71 62* 92  BUN 28* 28* 23* 21* 19  CREATININE 3.32* 3.30* 3.11* 2.92* 2.92*  CALCIUM 10.9* 10.0 9.8 10.3 11.0*   Liver Function Tests:  Recent Labs Lab 05/29/15 2051 05/30/15 0545 05/31/15 0538 06/02/15 0523  AST 48* 44* 30 23  ALT 22 20 16  13*  ALKPHOS 45 39 33* 38  BILITOT 0.7 0.5 0.6 0.6  PROT 7.5 6.5 6.1* 6.4*  ALBUMIN 3.8 3.3* 3.0* 3.3*   No results for input(s): LIPASE, AMYLASE in the last 168 hours. No results for input(s): AMMONIA in the last 168 hours. CBC:  Recent Labs Lab 05/29/15 2051 05/30/15 0545 05/30/15 1411 05/31/15 0538 06/01/15 0541  WBC 9.0 8.1 6.9 8.4 9.1  NEUTROABS 4.2 3.7  --   --   --   HGB 11.8* 10.3* 10.2* 10.1* 10.2*  HCT 38.7 34.1* 33.2* 33.0* 33.3*  MCV 88.2 87.4 87.8 87.8 88.1  PLT 304 278 269 259 253   Cardiac Enzymes: No results for input(s): CKTOTAL, CKMB, CKMBINDEX, TROPONINI in the last 168 hours. BNP: BNP (last 3 results)  Recent Labs  07/27/14 1341  BNP 186.3*    ProBNP (last 3 results) No results for input(s): PROBNP in the last 8760 hours.  CBG:  Recent Labs Lab 06/01/15 1144 06/01/15 1733 06/01/15 2216 06/02/15 0748 06/02/15 0750  GLUCAP 108* 93 111* 82 84       Signed:  Askia Hazelip MD, PhD  [facility] 06/02/2015, 9:26 AM

## 2015-06-03 ENCOUNTER — Other Ambulatory Visit: Payer: Self-pay | Admitting: Cardiology

## 2015-06-03 LAB — CULTURE, BLOOD (ROUTINE X 2)
CULTURE: NO GROWTH
Culture: NO GROWTH

## 2015-06-03 LAB — WOUND CULTURE

## 2015-06-03 NOTE — Telephone Encounter (Signed)
Rx Refill

## 2015-06-13 ENCOUNTER — Ambulatory Visit (INDEPENDENT_AMBULATORY_CARE_PROVIDER_SITE_OTHER): Payer: Medicare HMO | Admitting: *Deleted

## 2015-06-13 DIAGNOSIS — I480 Paroxysmal atrial fibrillation: Secondary | ICD-10-CM

## 2015-06-14 NOTE — Progress Notes (Signed)
Carelink Summary Report / Loop Recorder 

## 2015-06-21 LAB — CUP PACEART REMOTE DEVICE CHECK: Date Time Interrogation Session: 20170319090548

## 2015-06-24 LAB — CUP PACEART REMOTE DEVICE CHECK: MDC IDC SESS DTM: 20170418090555

## 2015-06-24 NOTE — Progress Notes (Signed)
Carelink summary report received. Battery status OK. Normal device function. No new symptom episodes, tachy episodes, brady, or pause episodes. 0.2% AF, +Xarelto. Monthly summary reports and ROV/PRN

## 2015-07-05 ENCOUNTER — Telehealth: Payer: Self-pay | Admitting: Cardiology

## 2015-07-05 NOTE — Telephone Encounter (Signed)
Spoke w/ pt and requested that she send a manual transmission b/c her home monitor has not updated in at least 14 days.   

## 2015-07-15 ENCOUNTER — Ambulatory Visit (INDEPENDENT_AMBULATORY_CARE_PROVIDER_SITE_OTHER): Payer: Medicare HMO | Admitting: *Deleted

## 2015-07-15 DIAGNOSIS — I480 Paroxysmal atrial fibrillation: Secondary | ICD-10-CM

## 2015-07-15 NOTE — Progress Notes (Signed)
Carelink Summary Report / Loop Recorder 

## 2015-07-16 ENCOUNTER — Other Ambulatory Visit: Payer: Self-pay | Admitting: Cardiology

## 2015-07-16 ENCOUNTER — Other Ambulatory Visit: Payer: Self-pay | Admitting: *Deleted

## 2015-07-16 MED ORDER — TORSEMIDE 20 MG PO TABS: 40.0000 mg | ORAL_TABLET | Freq: Two times a day (BID) | ORAL | Status: DC

## 2015-07-19 ENCOUNTER — Telehealth: Payer: Self-pay | Admitting: Cardiology

## 2015-07-19 NOTE — Telephone Encounter (Signed)
Spoke w/ pt and requested that she send a manual transmission b/c her home monitor has not updated in at least 14 days.   

## 2015-07-22 ENCOUNTER — Other Ambulatory Visit: Payer: Self-pay | Admitting: Urology

## 2015-07-22 LAB — CUP PACEART REMOTE DEVICE CHECK
Date Time Interrogation Session: 20170518093541
Date Time Interrogation Session: 20170617100546

## 2015-07-27 DIAGNOSIS — T148XXA Other injury of unspecified body region, initial encounter: Secondary | ICD-10-CM

## 2015-07-27 HISTORY — DX: Other injury of unspecified body region, initial encounter: T14.8XXA

## 2015-08-01 ENCOUNTER — Encounter (HOSPITAL_COMMUNITY): Payer: Self-pay | Admitting: Nurse Practitioner

## 2015-08-01 ENCOUNTER — Ambulatory Visit (HOSPITAL_COMMUNITY)
Admission: RE | Admit: 2015-08-01 | Discharge: 2015-08-01 | Disposition: A | Discharge status: Home / Self Care | Payer: Medicare HMO | Source: Ambulatory Visit | Attending: Nurse Practitioner | Admitting: Nurse Practitioner

## 2015-08-01 ENCOUNTER — Other Ambulatory Visit: Payer: Self-pay

## 2015-08-01 VITALS — BP 128/60 | HR 51 | Ht 62.0 in | Wt 203.6 lb

## 2015-08-01 DIAGNOSIS — I5032 Chronic diastolic (congestive) heart failure: Secondary | ICD-10-CM | POA: Diagnosis not present

## 2015-08-01 DIAGNOSIS — E039 Hypothyroidism, unspecified: Secondary | ICD-10-CM | POA: Insufficient documentation

## 2015-08-01 DIAGNOSIS — K449 Diaphragmatic hernia without obstruction or gangrene: Secondary | ICD-10-CM | POA: Diagnosis not present

## 2015-08-01 DIAGNOSIS — Z7901 Long term (current) use of anticoagulants: Secondary | ICD-10-CM | POA: Diagnosis not present

## 2015-08-01 DIAGNOSIS — F329 Major depressive disorder, single episode, unspecified: Secondary | ICD-10-CM | POA: Insufficient documentation

## 2015-08-01 DIAGNOSIS — E1122 Type 2 diabetes mellitus with diabetic chronic kidney disease: Secondary | ICD-10-CM | POA: Diagnosis not present

## 2015-08-01 DIAGNOSIS — E785 Hyperlipidemia, unspecified: Secondary | ICD-10-CM | POA: Insufficient documentation

## 2015-08-01 DIAGNOSIS — K5790 Diverticulosis of intestine, part unspecified, without perforation or abscess without bleeding: Secondary | ICD-10-CM | POA: Diagnosis not present

## 2015-08-01 DIAGNOSIS — K3184 Gastroparesis: Secondary | ICD-10-CM | POA: Insufficient documentation

## 2015-08-01 DIAGNOSIS — M109 Gout, unspecified: Secondary | ICD-10-CM | POA: Diagnosis not present

## 2015-08-01 DIAGNOSIS — I130 Hypertensive heart and chronic kidney disease with heart failure and stage 1 through stage 4 chronic kidney disease, or unspecified chronic kidney disease: Secondary | ICD-10-CM | POA: Insufficient documentation

## 2015-08-01 DIAGNOSIS — E559 Vitamin D deficiency, unspecified: Secondary | ICD-10-CM | POA: Diagnosis not present

## 2015-08-01 DIAGNOSIS — Z9981 Dependence on supplemental oxygen: Secondary | ICD-10-CM | POA: Insufficient documentation

## 2015-08-01 DIAGNOSIS — I480 Paroxysmal atrial fibrillation: Secondary | ICD-10-CM | POA: Diagnosis present

## 2015-08-01 DIAGNOSIS — K219 Gastro-esophageal reflux disease without esophagitis: Secondary | ICD-10-CM | POA: Insufficient documentation

## 2015-08-01 DIAGNOSIS — M1990 Unspecified osteoarthritis, unspecified site: Secondary | ICD-10-CM | POA: Insufficient documentation

## 2015-08-01 DIAGNOSIS — Z794 Long term (current) use of insulin: Secondary | ICD-10-CM | POA: Insufficient documentation

## 2015-08-01 DIAGNOSIS — I440 Atrioventricular block, first degree: Secondary | ICD-10-CM | POA: Diagnosis not present

## 2015-08-01 DIAGNOSIS — E1143 Type 2 diabetes mellitus with diabetic autonomic (poly)neuropathy: Secondary | ICD-10-CM | POA: Diagnosis not present

## 2015-08-01 DIAGNOSIS — F419 Anxiety disorder, unspecified: Secondary | ICD-10-CM | POA: Diagnosis not present

## 2015-08-01 DIAGNOSIS — R001 Bradycardia, unspecified: Secondary | ICD-10-CM | POA: Diagnosis not present

## 2015-08-01 DIAGNOSIS — Z880 Allergy status to penicillin: Secondary | ICD-10-CM | POA: Diagnosis not present

## 2015-08-01 DIAGNOSIS — K589 Irritable bowel syndrome without diarrhea: Secondary | ICD-10-CM | POA: Insufficient documentation

## 2015-08-01 DIAGNOSIS — J45909 Unspecified asthma, uncomplicated: Secondary | ICD-10-CM | POA: Diagnosis not present

## 2015-08-01 DIAGNOSIS — G629 Polyneuropathy, unspecified: Secondary | ICD-10-CM | POA: Diagnosis not present

## 2015-08-01 DIAGNOSIS — G510 Bell's palsy: Secondary | ICD-10-CM | POA: Insufficient documentation

## 2015-08-01 DIAGNOSIS — M5030 Other cervical disc degeneration, unspecified cervical region: Secondary | ICD-10-CM | POA: Insufficient documentation

## 2015-08-01 DIAGNOSIS — N183 Chronic kidney disease, stage 3 (moderate): Secondary | ICD-10-CM | POA: Diagnosis not present

## 2015-08-01 DIAGNOSIS — Z6839 Body mass index (BMI) 39.0-39.9, adult: Secondary | ICD-10-CM | POA: Insufficient documentation

## 2015-08-01 DIAGNOSIS — E669 Obesity, unspecified: Secondary | ICD-10-CM | POA: Insufficient documentation

## 2015-08-01 MED ORDER — DILTIAZEM HCL ER COATED BEADS 120 MG PO CP24: 120.0000 mg | ORAL_CAPSULE | Freq: Every day | ORAL | Status: DC

## 2015-08-01 NOTE — Patient Instructions (Signed)
Your physician has recommended you make the following change in your medication:  1)Decrease cardizem 120mg  once a day
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
## 2015-08-05 ENCOUNTER — Encounter (HOSPITAL_COMMUNITY)
Admission: RE | Admit: 2015-08-05 | Discharge: 2015-08-05 | Disposition: A | Discharge status: Home / Self Care | Payer: Medicare HMO | Source: Ambulatory Visit | Attending: Urology | Admitting: Urology

## 2015-08-05 ENCOUNTER — Encounter (HOSPITAL_COMMUNITY): Payer: Self-pay

## 2015-08-05 DIAGNOSIS — Z01812 Encounter for preprocedural laboratory examination: Secondary | ICD-10-CM | POA: Diagnosis present

## 2015-08-05 HISTORY — DX: Personal history of urinary calculi: Z87.442

## 2015-08-05 HISTORY — DX: Personal history of other medical treatment: Z92.89

## 2015-08-05 LAB — CBC
HCT: 39.8 % (ref 36.0–46.0)
HEMOGLOBIN: 12.8 g/dL (ref 12.0–15.0)
MCH: 29.2 pg (ref 26.0–34.0)
MCHC: 32.2 g/dL (ref 30.0–36.0)
MCV: 90.9 fL (ref 78.0–100.0)
Platelets: 314 10*3/uL (ref 150–400)
RBC: 4.38 MIL/uL (ref 3.87–5.11)
RDW: 18.6 % — ABNORMAL HIGH (ref 11.5–15.5)
WBC: 9.9 10*3/uL (ref 4.0–10.5)

## 2015-08-05 LAB — BASIC METABOLIC PANEL
ANION GAP: 11 (ref 5–15)
BUN: 25 mg/dL — ABNORMAL HIGH (ref 6–20)
CALCIUM: 10.6 mg/dL — AB (ref 8.9–10.3)
CO2: 30 mmol/L (ref 22–32)
Chloride: 96 mmol/L — ABNORMAL LOW (ref 101–111)
Creatinine, Ser: 3.9 mg/dL — ABNORMAL HIGH (ref 0.44–1.00)
GFR, EST AFRICAN AMERICAN: 12 mL/min — AB (ref >60)
GFR, EST NON AFRICAN AMERICAN: 11 mL/min — AB (ref >60)
GLUCOSE: 64 mg/dL — AB (ref 65–99)
Potassium: 3.4 mmol/L — ABNORMAL LOW (ref 3.5–5.1)
Sodium: 137 mmol/L (ref 135–145)

## 2015-08-05 LAB — SURGICAL PCR SCREEN
MRSA, PCR: NEGATIVE
Staphylococcus aureus: NEGATIVE

## 2015-08-05 NOTE — Progress Notes (Signed)
BMP results in epic per PAT visit 08/05/2015 sent to Dr Manny/[facility]

## 2015-08-05 NOTE — Patient Instructions (Signed)
[name]  08/05/2015   Your procedure is scheduled on: Friday August 09, 2015  Report to [facility]  Entrance take [place]  elevators to 3rd floor to  [place] at 7:00 AM.  Call this number if you have problems the morning of surgery [phone]   Remember: ONLY 1 PERSON MAY GO WITH YOU TO SHORT STAY TO GET  READY MORNING OF [place].  Do not eat food or drink liquids :After Midnight.     Take these medicines the morning of surgery with A SIP OF WATER: Amiodarone; Cetirizine (Zyrtec) if needed; Clonazepam (KIonopin) if needed; Diltiazem (Cardiazem); Levothyroxine; Metoprolol;                  TAKE 1/2 DOSE OF INSULIN NIGHT PRIOR TO SURGERY IF CBG FALLS WITHIN PARAMETERS OF NEED FOR INSULIN  DO NOT TAKE ANY DIABETIC MEDICATIONS DAY OF YOUR SURGERY                               You may not have any metal on your body including hair pins and              piercings  Do not wear jewelry, make-up, lotions, powders or perfumes, deodorant             Do not wear nail polish.  Do not shave  48 hours prior to surgery.               Do not bring valuables to the hospital. [place].  Contacts, dentures or bridgework may not be worn into surgery.      Patients discharged the day of surgery will not be allowed to drive home.  Name and phone number of your driver:[name] (daughter)  _____________________________________________________________________             [facility] - Preparing for Surgery Before surgery, you can play an important role.  Because skin is not sterile, your skin needs to be as free of germs as possible.  You can reduce the number of germs on your skin by washing with CHG (chlorahexidine gluconate) soap before surgery.  CHG is an antiseptic cleaner which kills germs and bonds with the skin to continue killing germs even after washing. Please DO NOT use if you have an  allergy to CHG or antibacterial soaps.  If your skin becomes reddened/irritated stop using the CHG and inform your nurse when you arrive at Short Stay. Do not shave (including legs and underarms) for at least 48 hours prior to the first CHG shower.  You may shave your face/neck. Please follow these instructions carefully:  1.  Shower with CHG Soap the night before surgery and the  morning of Surgery.  2.  If you choose to wash your hair, wash your hair first as usual with your  normal  shampoo.  3.  After you shampoo, rinse your hair and body thoroughly to remove the  shampoo.                           4.  Use CHG as you would any other liquid soap.  You can apply chg directly  to  the skin and wash                       Gently with a scrungie or clean washcloth.  5.  Apply the CHG Soap to your body ONLY FROM THE NECK DOWN.   Do not use on face/ open                           Wound or open sores. Avoid contact with eyes, ears mouth and genitals (private parts).                       Wash face,  Genitals (private parts) with your normal soap.             6.  Wash thoroughly, paying special attention to the area where your surgery  will be performed.  7.  Thoroughly rinse your body with warm water from the neck down.  8.  DO NOT shower/wash with your normal soap after using and rinsing off  the CHG Soap.                9.  Pat yourself dry with a clean towel.            10.  Wear clean pajamas.            11.  Place clean sheets on your bed the night of your first shower and do not  sleep with pets. Day of Surgery : Do not apply any lotions/deodorants the morning of surgery.  Please wear clean clothes to the hospital/surgery center.  FAILURE TO FOLLOW THESE INSTRUCTIONS MAY RESULT IN THE CANCELLATION OF YOUR SURGERY PATIENT SIGNATURE_________________________________  NURSE SIGNATURE__________________________________  ________________________________________________________________________

## 2015-08-07 ENCOUNTER — Encounter (HOSPITAL_COMMUNITY): Payer: Medicare HMO | Admitting: Nurse Practitioner

## 2015-08-07 ENCOUNTER — Other Ambulatory Visit (HOSPITAL_COMMUNITY): Payer: Self-pay | Admitting: *Deleted

## 2015-08-07 LAB — HEMOGLOBIN A1C
Hgb A1c MFr Bld: 5.6 % (ref 4.8–5.6)
MEAN PLASMA GLUCOSE: 114 mg/dL

## 2015-08-07 MED ORDER — METOPROLOL TARTRATE 50 MG PO TABS: 25.0000 mg | ORAL_TABLET | Freq: Two times a day (BID) | ORAL | Status: DC

## 2015-08-09 ENCOUNTER — Encounter (HOSPITAL_COMMUNITY): Payer: Self-pay | Admitting: *Deleted

## 2015-08-09 ENCOUNTER — Ambulatory Visit (HOSPITAL_COMMUNITY)
Admission: RE | Admit: 2015-08-09 | Discharge: 2015-08-09 | Disposition: A | Discharge status: Home / Self Care | Payer: Medicare HMO | Source: Ambulatory Visit | Attending: Urology | Admitting: Urology

## 2015-08-09 ENCOUNTER — Encounter (HOSPITAL_COMMUNITY)
Admission: RE | Disposition: A | Discharge status: Home / Self Care | Payer: Self-pay | Source: Ambulatory Visit | Attending: Urology

## 2015-08-09 ENCOUNTER — Ambulatory Visit (HOSPITAL_COMMUNITY): Payer: Medicare HMO | Admitting: Anesthesiology

## 2015-08-09 DIAGNOSIS — J449 Chronic obstructive pulmonary disease, unspecified: Secondary | ICD-10-CM | POA: Diagnosis not present

## 2015-08-09 DIAGNOSIS — Z7901 Long term (current) use of anticoagulants: Secondary | ICD-10-CM | POA: Insufficient documentation

## 2015-08-09 DIAGNOSIS — K3184 Gastroparesis: Secondary | ICD-10-CM | POA: Insufficient documentation

## 2015-08-09 DIAGNOSIS — I480 Paroxysmal atrial fibrillation: Secondary | ICD-10-CM | POA: Insufficient documentation

## 2015-08-09 DIAGNOSIS — M1990 Unspecified osteoarthritis, unspecified site: Secondary | ICD-10-CM | POA: Insufficient documentation

## 2015-08-09 DIAGNOSIS — E785 Hyperlipidemia, unspecified: Secondary | ICD-10-CM | POA: Insufficient documentation

## 2015-08-09 DIAGNOSIS — K449 Diaphragmatic hernia without obstruction or gangrene: Secondary | ICD-10-CM | POA: Diagnosis not present

## 2015-08-09 DIAGNOSIS — E1143 Type 2 diabetes mellitus with diabetic autonomic (poly)neuropathy: Secondary | ICD-10-CM | POA: Diagnosis not present

## 2015-08-09 DIAGNOSIS — E1140 Type 2 diabetes mellitus with diabetic neuropathy, unspecified: Secondary | ICD-10-CM | POA: Diagnosis not present

## 2015-08-09 DIAGNOSIS — E1151 Type 2 diabetes mellitus with diabetic peripheral angiopathy without gangrene: Secondary | ICD-10-CM | POA: Insufficient documentation

## 2015-08-09 DIAGNOSIS — I130 Hypertensive heart and chronic kidney disease with heart failure and stage 1 through stage 4 chronic kidney disease, or unspecified chronic kidney disease: Secondary | ICD-10-CM | POA: Insufficient documentation

## 2015-08-09 DIAGNOSIS — Z905 Acquired absence of kidney: Secondary | ICD-10-CM | POA: Diagnosis not present

## 2015-08-09 DIAGNOSIS — I2510 Atherosclerotic heart disease of native coronary artery without angina pectoris: Secondary | ICD-10-CM | POA: Insufficient documentation

## 2015-08-09 DIAGNOSIS — Z85528 Personal history of other malignant neoplasm of kidney: Secondary | ICD-10-CM | POA: Insufficient documentation

## 2015-08-09 DIAGNOSIS — L7634 Postprocedural seroma of skin and subcutaneous tissue following other procedure: Secondary | ICD-10-CM | POA: Insufficient documentation

## 2015-08-09 DIAGNOSIS — I428 Other cardiomyopathies: Secondary | ICD-10-CM | POA: Diagnosis not present

## 2015-08-09 DIAGNOSIS — G4730 Sleep apnea, unspecified: Secondary | ICD-10-CM | POA: Insufficient documentation

## 2015-08-09 DIAGNOSIS — Z794 Long term (current) use of insulin: Secondary | ICD-10-CM | POA: Insufficient documentation

## 2015-08-09 DIAGNOSIS — Z79899 Other long term (current) drug therapy: Secondary | ICD-10-CM | POA: Insufficient documentation

## 2015-08-09 DIAGNOSIS — Z79890 Hormone replacement therapy (postmenopausal): Secondary | ICD-10-CM | POA: Insufficient documentation

## 2015-08-09 DIAGNOSIS — I5032 Chronic diastolic (congestive) heart failure: Secondary | ICD-10-CM | POA: Insufficient documentation

## 2015-08-09 DIAGNOSIS — Y838 Other surgical procedures as the cause of abnormal reaction of the patient, or of later complication, without mention of misadventure at the time of the procedure: Secondary | ICD-10-CM | POA: Diagnosis not present

## 2015-08-09 DIAGNOSIS — K219 Gastro-esophageal reflux disease without esophagitis: Secondary | ICD-10-CM | POA: Insufficient documentation

## 2015-08-09 DIAGNOSIS — E039 Hypothyroidism, unspecified: Secondary | ICD-10-CM | POA: Insufficient documentation

## 2015-08-09 DIAGNOSIS — Z9981 Dependence on supplemental oxygen: Secondary | ICD-10-CM | POA: Diagnosis not present

## 2015-08-09 DIAGNOSIS — E1122 Type 2 diabetes mellitus with diabetic chronic kidney disease: Secondary | ICD-10-CM | POA: Insufficient documentation

## 2015-08-09 DIAGNOSIS — Z6830 Body mass index (BMI) 30.0-30.9, adult: Secondary | ICD-10-CM | POA: Diagnosis not present

## 2015-08-09 DIAGNOSIS — S31103A Unspecified open wound of abdominal wall, right lower quadrant without penetration into peritoneal cavity, initial encounter: Secondary | ICD-10-CM | POA: Diagnosis present

## 2015-08-09 DIAGNOSIS — N183 Chronic kidney disease, stage 3 (moderate): Secondary | ICD-10-CM | POA: Insufficient documentation

## 2015-08-09 DIAGNOSIS — I252 Old myocardial infarction: Secondary | ICD-10-CM | POA: Insufficient documentation

## 2015-08-09 DIAGNOSIS — Z6837 Body mass index (BMI) 37.0-37.9, adult: Secondary | ICD-10-CM | POA: Insufficient documentation

## 2015-08-09 DIAGNOSIS — Y836 Removal of other organ (partial) (total) as the cause of abnormal reaction of the patient, or of later complication, without mention of misadventure at the time of the procedure: Secondary | ICD-10-CM | POA: Insufficient documentation

## 2015-08-09 HISTORY — PX: WOUND EXPLORATION: SHX6188

## 2015-08-09 HISTORY — PX: CYSTOSCOPY: SHX5120

## 2015-08-09 LAB — GLUCOSE, CAPILLARY
GLUCOSE-CAPILLARY: 151 mg/dL — AB (ref 65–99)
GLUCOSE-CAPILLARY: 123 mg/dL — AB (ref 65–99)

## 2015-08-09 LAB — PROTIME-INR
INR: 1.23 (ref 0.00–1.49)
PROTHROMBIN TIME: 15.2 s (ref 11.6–15.2)

## 2015-08-09 SURGERY — WOUND EXPLORATION
Anesthesia: General | Laterality: Right

## 2015-08-09 MED ORDER — CLINDAMYCIN PHOSPHATE 900 MG/50ML IV SOLN
INTRAVENOUS | Status: AC
  Filled 2015-08-09: qty 50

## 2015-08-09 MED ORDER — LACTATED RINGERS IV SOLN
INTRAVENOUS | Status: DC
  Administered 2015-08-09: 1000 mL via INTRAVENOUS
  Administered 2015-08-09: 10:00:00 via INTRAVENOUS

## 2015-08-09 MED ORDER — TRAMADOL HCL 50 MG PO TABS: 50.0000 mg | ORAL_TABLET | Freq: Four times a day (QID) | ORAL | Status: DC | PRN

## 2015-08-09 MED ORDER — 0.9 % SODIUM CHLORIDE (POUR BTL) OPTIME
TOPICAL | Status: DC | PRN
  Administered 2015-08-09: 1000 mL

## 2015-08-09 MED ORDER — LIDOCAINE 2% (20 MG/ML) 5 ML SYRINGE
INTRAMUSCULAR | Status: DC | PRN
  Administered 2015-08-09: 60 mg via INTRAVENOUS

## 2015-08-09 MED ORDER — LIDOCAINE HCL (CARDIAC) 20 MG/ML IV SOLN
INTRAVENOUS | Status: AC
  Filled 2015-08-09: qty 5

## 2015-08-09 MED ORDER — BUPIVACAINE-EPINEPHRINE 0.25% -1:200000 IJ SOLN
INTRAMUSCULAR | Status: AC
  Filled 2015-08-09: qty 1

## 2015-08-09 MED ORDER — PROMETHAZINE HCL 25 MG/ML IJ SOLN: 6.2500 mg | INTRAMUSCULAR | Status: DC | PRN

## 2015-08-09 MED ORDER — EPHEDRINE SULFATE 50 MG/ML IJ SOLN
INTRAMUSCULAR | Status: DC | PRN
  Administered 2015-08-09: 25 mg via INTRAVENOUS

## 2015-08-09 MED ORDER — BUPIVACAINE-EPINEPHRINE 0.25% -1:200000 IJ SOLN
INTRAMUSCULAR | Status: DC | PRN
  Administered 2015-08-09: 30 mL

## 2015-08-09 MED ORDER — EPHEDRINE SULFATE 50 MG/ML IJ SOLN
INTRAMUSCULAR | Status: AC
  Filled 2015-08-09: qty 1

## 2015-08-09 MED ORDER — PROPOFOL 10 MG/ML IV BOLUS
INTRAVENOUS | Status: DC | PRN
  Administered 2015-08-09: 130 mg via INTRAVENOUS

## 2015-08-09 MED ORDER — PROPOFOL 10 MG/ML IV BOLUS
INTRAVENOUS | Status: AC
  Filled 2015-08-09: qty 20

## 2015-08-09 MED ORDER — STERILE WATER FOR IRRIGATION IR SOLN
Status: DC | PRN
  Administered 2015-08-09: 3000 mL

## 2015-08-09 MED ORDER — ONDANSETRON HCL 4 MG/2ML IJ SOLN
INTRAMUSCULAR | Status: DC | PRN
Start: 2015-08-09 — End: 2015-08-09
  Administered 2015-08-09: 4 mg via INTRAVENOUS

## 2015-08-09 MED ORDER — SODIUM CHLORIDE 0.9 % IR SOLN
Status: DC | PRN
  Administered 2015-08-09: 1000 mL

## 2015-08-09 MED ORDER — FENTANYL CITRATE (PF) 100 MCG/2ML IJ SOLN
INTRAMUSCULAR | Status: AC
  Filled 2015-08-09: qty 2

## 2015-08-09 MED ORDER — HYDROMORPHONE HCL 1 MG/ML IJ SOLN: 0.2500 mg | INTRAMUSCULAR | Status: DC | PRN

## 2015-08-09 MED ORDER — CLINDAMYCIN PHOSPHATE 900 MG/50ML IV SOLN
900.0000 mg | INTRAVENOUS | Status: AC
  Administered 2015-08-09: 900 mg via INTRAVENOUS

## 2015-08-09 MED ORDER — SODIUM CHLORIDE 0.9 % IJ SOLN
INTRAMUSCULAR | Status: AC
  Filled 2015-08-09: qty 10

## 2015-08-09 MED ORDER — ONDANSETRON HCL 4 MG/2ML IJ SOLN
INTRAMUSCULAR | Status: AC
  Filled 2015-08-09: qty 2

## 2015-08-09 MED ORDER — FENTANYL CITRATE (PF) 100 MCG/2ML IJ SOLN
INTRAMUSCULAR | Status: DC | PRN
  Administered 2015-08-09 (×2): 25 ug via INTRAVENOUS

## 2015-08-09 SURGICAL SUPPLY — 29 items
BLADE HEX COATED 2.75 (ELECTRODE) ×4 IMPLANT
BNDG GAUZE ELAST 4 BULKY (GAUZE/BANDAGES/DRESSINGS) ×2 IMPLANT
CHLORAPREP W/TINT 26ML (MISCELLANEOUS) ×2 IMPLANT
COVER SURGICAL LIGHT HANDLE (MISCELLANEOUS) ×2 IMPLANT
DRAPE LAPAROSCOPIC ABDOMINAL (DRAPES) ×4 IMPLANT
DRSG PAD ABDOMINAL 8X10 ST (GAUZE/BANDAGES/DRESSINGS) ×10 IMPLANT
ELECT REM PT RETURN 9FT ADLT (ELECTROSURGICAL) ×4
ELECTRODE REM PT RTRN 9FT ADLT (ELECTROSURGICAL) ×2 IMPLANT
GAUZE SPONGE 4X4 12PLY STRL (GAUZE/BANDAGES/DRESSINGS) ×2 IMPLANT
GOWN SPEC L4 XLG W/TWL (GOWN DISPOSABLE) ×4 IMPLANT
GOWN STRL REUS W/TWL XL LVL3 (GOWN DISPOSABLE) ×10 IMPLANT
HANDLE SUCTION POOLE (INSTRUMENTS) ×2 IMPLANT
HANDPIECE INTERPULSE COAX TIP (DISPOSABLE) ×4
KIT BASIN OR (CUSTOM PROCEDURE TRAY) ×4 IMPLANT
PACK GENERAL/GYN (CUSTOM PROCEDURE TRAY) ×4 IMPLANT
PAD ABD 8X10 STRL (GAUZE/BANDAGES/DRESSINGS) ×2 IMPLANT
SCRUB TECHNI CARE 4 OZ NO DYE (MISCELLANEOUS) ×2 IMPLANT
SET HNDPC FAN SPRY TIP SCT (DISPOSABLE) IMPLANT
SPONGE LAP 18X18 X RAY DECT (DISPOSABLE) IMPLANT
STAPLER VISISTAT 35W (STAPLE) ×2 IMPLANT
SUCTION POOLE HANDLE (INSTRUMENTS)
SUT PDS AB 1 CTX 36 (SUTURE) IMPLANT
SUT PDS AB 1 TP1 96 (SUTURE) IMPLANT
SUT VIC AB 4-0 SH 18 (SUTURE) IMPLANT
TAPE PAPER 3X10 WHT MICROPORE (GAUZE/BANDAGES/DRESSINGS) ×2 IMPLANT
TOWEL OR 17X26 10 PK STRL BLUE (TOWEL DISPOSABLE) ×6 IMPLANT
TOWEL OR NON WOVEN STRL DISP B (DISPOSABLE) ×4 IMPLANT
TRAY FOLEY W/METER SILVER 14FR (SET/KITS/TRAYS/PACK) ×2 IMPLANT
TRAY FOLEY W/METER SILVER 16FR (SET/KITS/TRAYS/PACK) ×2 IMPLANT

## 2015-08-09 NOTE — Transfer of Care (Signed)
Immediate Anesthesia Transfer of Care Note  Patient: [name]  Procedure(s) Performed: Procedure(s): WOUND EXPLORATION (Right) CYSTOSCOPY FLEXIBLE (N/A)  Patient Location: PACU  Anesthesia Type:General  Level of Consciousness:  sedated, patient cooperative and responds to stimulation  Airway & Oxygen Therapy:Patient Spontanous Breathing and Patient connected to face mask oxgen  Post-op Assessment:  Report given to PACU RN and Post -op Vital signs reviewed and stable  Post vital signs:  Reviewed and stable  Last Vitals:  Filed Vitals:   08/09/15 0711 08/09/15 0725  BP: 82/51 112/50  Pulse: 88   Temp: 123XX123 C     Complications: No apparent anesthesia complications

## 2015-08-09 NOTE — Anesthesia Preprocedure Evaluation (Addendum)
Anesthesia Evaluation  Patient identified by MRN, date of birth, ID band Patient awake    Reviewed: Allergy & Precautions, NPO status , Patient's Chart, lab work & pertinent test results  History of Anesthesia Complications (+) PROLONGED EMERGENCE, Family history of anesthesia reaction and history of anesthetic complications  Airway Mallampati: II  TM Distance: >3 FB     Dental no notable dental hx. (+) Teeth Intact, Dental Advisory Given   Pulmonary shortness of breath and with exertion, asthma , sleep apnea and Oxygen sleep apnea ,    Pulmonary exam normal        Cardiovascular hypertension, Pt. on medications and Pt. on home beta blockers + angina + CAD, + Past MI, + Peripheral Vascular Disease and +CHF  + dysrhythmias  Rhythm:Regular Rate:Normal  ECHO: 08-02-13: Study Conclusions  - Left ventricle: The cavity size was normal. There was mild concentric hypertrophy. Systolic function was normal. The estimated ejection fraction was in the range of 55% to 60%. Wall motion was normal; there were no regional wall motion abnormalities. Doppler parameters are consistent with abnormal left ventricular relaxation (grade 1 diastolic dysfunction). - Mitral valve: Calcified annulus. There was trivial regurgitation. - Atrial septum: No defect or patent foramen ovale was identified.     Neuro/Psych PSYCHIATRIC DISORDERS Anxiety Depression  Neuromuscular disease    GI/Hepatic Neg liver ROS, hiatal hernia, GERD  Medicated,  Endo/Other  diabetes, Type 2, Insulin Dependent, Oral Hypoglycemic AgentsHypothyroidism Morbid obesity  Renal/GU Renal diseaseCr 1.52 K 4.2  negative genitourinary   Musculoskeletal  (+) Arthritis ,   Abdominal (+) - obese,   Peds negative pediatric ROS (+)  Hematology  (+) anemia , Hgb 9.0   Anesthesia Other Findings   Reproductive/Obstetrics negative OB ROS                             Anesthesia Physical  Anesthesia Plan  ASA: IV  Anesthesia Plan: General   Post-op Pain Management:    Induction: Intravenous  Airway Management Planned: LMA  Additional Equipment:   Intra-op Plan:   Post-operative Plan: Extubation in OR  Informed Consent: I have reviewed the patients History and Physical, chart, labs and discussed the procedure including the risks, benefits and alternatives for the proposed anesthesia with the patient or authorized representative who has indicated his/her understanding and acceptance.   Dental advisory given  Plan Discussed with: CRNA and Anesthesiologist  Anesthesia Plan Comments:        Anesthesia Quick Evaluation

## 2015-08-09 NOTE — Discharge Instructions (Signed)
1 - Please continue wet to dry packing daily. It will likely take weeks for wound to completely fill in. You may shower and all activity without restrictions.   2 - Call MD or go to ER for fever >102, severe pain / nausea / vomiting not relieved by medications, or acute change in medical status  General Anesthesia, Adult, Care After Refer to this sheet in the next few weeks. These instructions provide you with information on caring for yourself after your procedure. Your health care provider may also give you more specific instructions. Your treatment has been planned according to current medical practices, but problems sometimes occur. Call your health care provider if you have any problems or questions after your procedure. WHAT TO EXPECT AFTER THE PROCEDURE After the procedure, it is typical to experience:  Sleepiness.  Nausea and vomiting. HOME CARE INSTRUCTIONS  For the first 24 hours after general anesthesia:  Have a responsible person with you.  Do not drive a car. If you are alone, do not take public transportation.  Do not drink alcohol.  Do not take medicine that has not been prescribed by your health care provider.  Do not sign important papers or make important decisions.  You may resume a normal diet and activities as directed by your health care provider.  Change bandages (dressings) as directed.  If you have questions or problems that seem related to general anesthesia, call the hospital and ask for the anesthetist or anesthesiologist on call. SEEK MEDICAL CARE IF:  You have nausea and vomiting that continue the day after anesthesia.  You develop a rash. SEEK IMMEDIATE MEDICAL CARE IF:   You have difficulty breathing.  You have chest pain.  You have any allergic problems.   This information is not intended to replace advice given to you by your health care provider. Make sure you discuss any questions you have with your health care provider.   Document  Released: 04/20/2000 Document Revised: 02/02/2014 Document Reviewed: 05/13/2011 Elsevier Interactive Patient Education 2016 [place] A dressing is a material placed over wounds. It keeps the wound clean, dry, and protected from further injury.  BEFORE YOU BEGIN  Get your supplies together. Things you may need include:  Salt solution (saline).  Flexible gauze bandage.  Medicated cream.  Tape.  Gloves.  Belly (abdominal) pads.  Gauze squares.  Plastic bags.  Take pain medicine 30 minutes before the bandage change if you need it.  Take a shower before you do the first bandage change of the day. Put plastic wrap or a bag over the dressing. REMOVING YOUR OLD BANDAGE  Wash your hands with soap and water. Dry your hands with a clean towel.  Put on your gloves.  Remove any tape.  Remove the old bandage as told. If it sticks, put a small amount of warm water on it to loosen the bandage.  Remove any gauze or packing tape in your wound.  Take off your gloves.  Put the gloves, tape, gauze, or any packing tape in a plastic bag. CHANGING YOUR BANDAGE  Open the supplies.  Take the cap off the salt solution.  Open the gauze. Leave the gauze on the inside of the package.  Put on your gloves.  Clean your wound as told by your doctor.  Keep your wound dry if your doctor told you to do so.  Your doctor may tell you to do one or more of the following:  Pick up the  gauze. Pour the salt solution over the gauze. Squeeze out the extra salt solution.  Put solution soaked gauze only in your wound, not on the skin around it.  Pack your wound loosely.  Put dry gauze on your wound.  Put belly pads over the dry gauze if your bandages soak through.  Tape the bandage in place so it will not fall off. Do not wrap the tape all the way around your arm or leg.  Take off your gloves. Put them in the plastic bag with the old bandage. Tie the bag shut and throw  it away.  Keep the bandage clean and dry.  Wash your hands. GET HELP RIGHT AWAY IF:   Your skin around the wound looks red.  Your wound feels more tender or sore.  You see yellowish-white fluid (pus) in the wound.  Your wound smells bad.  You have a fever.  Your skin around the wound has a red rash that itches and burns.  You see black or yellow skin in your wound that was not there before.  You feel sick to your stomach (nauseous), throw up (vomit), and feel very tired.   This information is not intended to replace advice given to you by your health care provider. Make sure you discuss any questions you have with your health care provider.   Document Released: 04/10/2008 Document Revised: 02/02/2014 Document Reviewed: 11/23/2010 Elsevier Interactive Patient Education [employer].

## 2015-08-09 NOTE — Anesthesia Procedure Notes (Signed)
Procedure Name: LMA Insertion Date/Time: 08/09/2015 10:04 AM Performed by: Talbot Grumbling Pre-anesthesia Checklist: Patient identified, Emergency Drugs available, Suction available and Patient being monitored Patient Re-evaluated:Patient Re-evaluated prior to inductionOxygen Delivery Method: Circle system utilized Preoxygenation: Pre-oxygenation with 100% oxygen Intubation Type: IV induction Ventilation: Mask ventilation without difficulty LMA: LMA inserted LMA Size: 4.0 Number of attempts: 1 Placement Confirmation: positive ETCO2 and breath sounds checked- equal and bilateral Tube secured with: Tape Dental Injury: Teeth and Oropharynx as per pre-operative assessment

## 2015-08-09 NOTE — Op Note (Signed)
[name], DOBISH NO.:  [id number]  MEDICAL RECORD NO.:  [id number]  LOCATION:  [facility]                         FACILITY:  [facility]  PHYSICIAN:  Alexis Frock, MD     DATE OF BIRTH:  04-06-1940  DATE OF PROCEDURE: 08/09/2015                              OPERATIVE REPORT   DIAGNOSIS:  History of kidney cancer, prolonged wound seroma.  PROCEDURES: 1. Cystoscopy. 2. Wound exploration with washout and biopsy, total surface area     approximately 10 cm2.  ESTIMATED BLOOD LOSS:  Nil.  COMPLICATION:  None.  SPECIMEN:  Wall of wound cavity, history of kidney cancer.  FINDINGS: 1. Nonfoul subcutaneous seroma in pannus only at the area of prior     extraction site.  No obvious neoplastic changes, fascia palpably     intact. 2. Unremarkable cystourethroscopy with no evidence of recurrent lower     tract cancer.  INDICATIONS:  [name] is a very pleasant, but quite comorbid 75 year old lady with history of right upper tract transitional cell carcinoma. She is status post right nephroureterectomy several months ago.  She has a history of extreme obesity with a very large pannus.  She has had a chronic seroma of a prior extraction site in the right lower quadrant. We initially managed this conservatively with local wound care; however, it had persistent nonfoul drainage.  Actual imaging was performed, which revealed no evidence of herniation and there was intact fascia.  This was most consistent with chronic seroma and no evidence of obvious local recurrence for prior malignancy.  Options were discussed including local wound care versus formal operative irrigation and washout with the goal of opening the seroma cavity, biopsying to rule out malignancy and then allowing healing by secondary intent and she wished to proceed. Informed consent was obtained and placed in the medical record.  PROCEDURE IN DETAIL:  The patient [name], was verified. Procedure  being cysto and wound washout was confirmed.  Procedure was carried out.  Time-out was performed.  Intravenous antibiotics were administered.  General LMA anesthesia was introduced.  The patient was placed into initially a frogleg position.  Sterile field was created by prepping and draping the patient's vagina and introitus using iodine. Next, cystourethroscopy was performed using a 16-French flexible cystoscope.  Inspection of the urinary bladder revealed no diverticula, calcifications, papillary lesions.  There was no evidence of recurrent tumor whatsoever and the patient does have an in-situ ureteral orifice on the right.  No evidence of tumor at this site.  The patient was placed into a supine position and her pannus was taped superiorly into her left side to allow better exposure of her right lower quadrant seroma site.  This area was prepped with chlorhexidine gluconate.  New sterile field was created.  Next, an incision was made directly onto the area of seroma approximately 4 cm in length.  This was inspected with palpation.  The seroma cavity was palpated, all the edges were easily felt.  There was no evidence of deep wound breakdown and fascia was palpably intact.  The incision was carried forward for total distance approximately 10 cm following further inspection of the seroma cavity. A representative  section of the wall of the cavity was excised, set aside for permanent pathology to rule out any neoplastic changes.  Next, the Pulsavac apparatus was used and pulse lavage was performed of all aspects of the seroma cavity using approximately 2000 mL of irrigant. Following this, there was excellent removal of all fibrinous tissue. Fascia remained palpably intact.  Additional hemostasis was achieved with point coagulation current.  A 30 mL of 0.25% Marcaine were infiltrated throughout the wound edges and that was then packed with half a roll of Kerlix and ABD applied on top of this  and procedure terminated.  The patient tolerated the procedure well and there were no immediate periprocedural complications.  The patient was taken to the postanesthesia care unit in stable condition.          ______________________________ Alexis Frock, MD     TM/MEDQ  D:  08/09/2015  T:  08/09/2015  Job:  [id number]

## 2015-08-09 NOTE — Progress Notes (Signed)
Pt has swollen left index finger.Marland KitchenMarland Kitchen

## 2015-08-09 NOTE — Anesthesia Postprocedure Evaluation (Signed)
Anesthesia Post Note  Patient: [name]  Procedure(s) Performed: Procedure(s) (LRB): WOUND EXPLORATION (Right) CYSTOSCOPY FLEXIBLE (N/A)  Patient location during evaluation: PACU Anesthesia Type: General Level of consciousness: sedated Pain management: pain level controlled Vital Signs Assessment: post-procedure vital signs reviewed and stable Respiratory status: spontaneous breathing and respiratory function stable Cardiovascular status: stable Anesthetic complications: no    Last Vitals:  Filed Vitals:   08/09/15 1140 08/09/15 1148  BP: 111/60 110/49  Pulse: 45 44  Temp: 36.8 C 36.4 C  Resp: 13 14    Last Pain:  Filed Vitals:   08/09/15 1152  PainSc: 0-No pain                 Darenda Fike DANIEL

## 2015-08-09 NOTE — Brief Op Note (Signed)
08/09/2015  10:41 AM  PATIENT:  [name]  75 y.o. female  PRE-OPERATIVE DIAGNOSIS:  WOUND SEROMA, ABDOMINAL,HISTORY OF KIDNEY CANCER  POST-OPERATIVE DIAGNOSIS:  WOUND SEROMA, ABDOMINAL, HISTORY OF KIDNEY CANCER  PROCEDURE:  Procedure(s): WOUND EXPLORATION (Right) CYSTOSCOPY FLEXIBLE (N/A)  SURGEON:  Surgeon(s) and Role:    * Alexis Frock, MD - Primary  PHYSICIAN ASSISTANT:   ASSISTANTS: none   ANESTHESIA:   local and general  EBL:     BLOOD ADMINISTERED:none  DRAINS: none   LOCAL MEDICATIONS USED:  MARCAINE     SPECIMEN:  Source of Specimen:  wound cavity, h/o kidney cancer  DISPOSITION OF SPECIMEN:  PATHOLOGY  COUNTS:  YES  TOURNIQUET:  * No tourniquets in log *  DICTATION: .Other Dictation: Dictation Number [id number]  PLAN OF CARE: Discharge to home after PACU  PATIENT DISPOSITION:  PACU - hemodynamically stable.   Delay start of Pharmacological VTE agent (>24hrs) due to surgical blood loss or risk of bleeding: yes
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
## 2015-08-12 ENCOUNTER — Ambulatory Visit (INDEPENDENT_AMBULATORY_CARE_PROVIDER_SITE_OTHER): Payer: Medicare HMO | Admitting: *Deleted

## 2015-08-12 DIAGNOSIS — I480 Paroxysmal atrial fibrillation: Secondary | ICD-10-CM | POA: Diagnosis not present

## 2015-08-12 NOTE — Progress Notes (Signed)
Carelink Summary Report / Loop Recorder 

## 2015-08-22 ENCOUNTER — Other Ambulatory Visit: Payer: Self-pay | Admitting: *Deleted

## 2015-09-05 ENCOUNTER — Telehealth: Payer: Self-pay | Admitting: Cardiology

## 2015-09-05 ENCOUNTER — Emergency Department (HOSPITAL_COMMUNITY)
Admission: EM | Admit: 2015-09-05 | Discharge: 2015-09-06 | Disposition: A | Discharge status: Home / Self Care | Payer: Medicare HMO | Attending: Emergency Medicine | Admitting: Emergency Medicine

## 2015-09-05 ENCOUNTER — Emergency Department (HOSPITAL_COMMUNITY): Payer: Medicare HMO

## 2015-09-05 DIAGNOSIS — I2510 Atherosclerotic heart disease of native coronary artery without angina pectoris: Secondary | ICD-10-CM | POA: Diagnosis not present

## 2015-09-05 DIAGNOSIS — Z794 Long term (current) use of insulin: Secondary | ICD-10-CM | POA: Diagnosis not present

## 2015-09-05 DIAGNOSIS — Z7984 Long term (current) use of oral hypoglycemic drugs: Secondary | ICD-10-CM | POA: Diagnosis not present

## 2015-09-05 DIAGNOSIS — E039 Hypothyroidism, unspecified: Secondary | ICD-10-CM | POA: Diagnosis not present

## 2015-09-05 DIAGNOSIS — N183 Chronic kidney disease, stage 3 (moderate): Secondary | ICD-10-CM | POA: Diagnosis not present

## 2015-09-05 DIAGNOSIS — E1122 Type 2 diabetes mellitus with diabetic chronic kidney disease: Secondary | ICD-10-CM | POA: Diagnosis not present

## 2015-09-05 DIAGNOSIS — J45909 Unspecified asthma, uncomplicated: Secondary | ICD-10-CM | POA: Insufficient documentation

## 2015-09-05 DIAGNOSIS — R42 Dizziness and giddiness: Secondary | ICD-10-CM

## 2015-09-05 DIAGNOSIS — N390 Urinary tract infection, site not specified: Secondary | ICD-10-CM

## 2015-09-05 DIAGNOSIS — Z91040 Latex allergy status: Secondary | ICD-10-CM | POA: Diagnosis not present

## 2015-09-05 DIAGNOSIS — R001 Bradycardia, unspecified: Secondary | ICD-10-CM | POA: Diagnosis not present

## 2015-09-05 DIAGNOSIS — Z86010 Personal history of colonic polyps: Secondary | ICD-10-CM | POA: Diagnosis not present

## 2015-09-05 DIAGNOSIS — I130 Hypertensive heart and chronic kidney disease with heart failure and stage 1 through stage 4 chronic kidney disease, or unspecified chronic kidney disease: Secondary | ICD-10-CM | POA: Insufficient documentation

## 2015-09-05 DIAGNOSIS — Z8553 Personal history of malignant neoplasm of renal pelvis: Secondary | ICD-10-CM | POA: Insufficient documentation

## 2015-09-05 DIAGNOSIS — I5032 Chronic diastolic (congestive) heart failure: Secondary | ICD-10-CM | POA: Insufficient documentation

## 2015-09-05 LAB — BASIC METABOLIC PANEL
Anion gap: 12 (ref 5–15)
BUN: 31 mg/dL — AB (ref 6–20)
CALCIUM: 10.4 mg/dL — AB (ref 8.9–10.3)
CO2: 26 mmol/L (ref 22–32)
CREATININE: 3.49 mg/dL — AB (ref 0.44–1.00)
Chloride: 98 mmol/L — ABNORMAL LOW (ref 101–111)
GFR calc Af Amer: 14 mL/min — ABNORMAL LOW (ref >60)
GFR, EST NON AFRICAN AMERICAN: 12 mL/min — AB (ref >60)
Glucose, Bld: 135 mg/dL — ABNORMAL HIGH (ref 65–99)
Potassium: 3.1 mmol/L — ABNORMAL LOW (ref 3.5–5.1)
SODIUM: 136 mmol/L (ref 135–145)

## 2015-09-05 LAB — CBC
HCT: 39.2 % (ref 36.0–46.0)
Hemoglobin: 12.7 g/dL (ref 12.0–15.0)
MCH: 30.3 pg (ref 26.0–34.0)
MCHC: 32.4 g/dL (ref 30.0–36.0)
MCV: 93.6 fL (ref 78.0–100.0)
PLATELETS: 305 10*3/uL (ref 150–400)
RBC: 4.19 MIL/uL (ref 3.87–5.11)
RDW: 15.6 % — AB (ref 11.5–15.5)
WBC: 8.7 10*3/uL (ref 4.0–10.5)

## 2015-09-05 LAB — URINE MICROSCOPIC-ADD ON

## 2015-09-05 LAB — URINALYSIS, ROUTINE W REFLEX MICROSCOPIC
Bilirubin Urine: NEGATIVE
GLUCOSE, UA: 100 mg/dL — AB
KETONES UR: NEGATIVE mg/dL
NITRITE: NEGATIVE
PROTEIN: NEGATIVE mg/dL
Specific Gravity, Urine: 1.009 (ref 1.005–1.030)
pH: 5.5 (ref 5.0–8.0)

## 2015-09-05 LAB — CUP PACEART REMOTE DEVICE CHECK: Date Time Interrogation Session: 20170717103534

## 2015-09-05 LAB — CBG MONITORING, ED: Glucose-Capillary: 133 mg/dL — ABNORMAL HIGH (ref 65–99)

## 2015-09-05 LAB — BRAIN NATRIURETIC PEPTIDE: B Natriuretic Peptide: 117.4 pg/mL — ABNORMAL HIGH (ref 0.0–100.0)

## 2015-09-05 LAB — I-STAT TROPONIN, ED: Troponin i, poc: 0.01 ng/mL (ref 0.00–0.08)

## 2015-09-05 MED ORDER — SODIUM CHLORIDE 0.9 % IV BOLUS (SEPSIS)
500.0000 mL | Freq: Once | INTRAVENOUS | Status: AC
  Administered 2015-09-05: 500 mL via INTRAVENOUS

## 2015-09-05 MED ORDER — FOSFOMYCIN TROMETHAMINE 3 G PO PACK
3.0000 g | PACK | Freq: Once | ORAL | Status: AC
Start: 2015-09-05 — End: 2015-09-06
  Administered 2015-09-06: 3 g via ORAL
  Filled 2015-09-05: qty 3

## 2015-09-05 MED ORDER — DIAZEPAM 2 MG PO TABS
2.0000 mg | ORAL_TABLET | Freq: Once | ORAL | Status: DC
  Filled 2015-09-05: qty 1

## 2015-09-05 MED ORDER — MECLIZINE HCL 25 MG PO TABS
25.0000 mg | ORAL_TABLET | Freq: Once | ORAL | Status: AC
  Administered 2015-09-05: 25 mg via ORAL
  Filled 2015-09-05: qty 1

## 2015-09-05 NOTE — ED Notes (Signed)
Pt family member states that last time pt had a UTI she was bradycardic.

## 2015-09-05 NOTE — ED Notes (Signed)
Patient transported to X-ray 

## 2015-09-05 NOTE — Telephone Encounter (Signed)
Agree  Diontae Route MD, FACC   

## 2015-09-05 NOTE — ED Provider Notes (Signed)
  Face-to-face evaluation   History: She presents for evaluation of dizziness. First dose earlier this morning. She has had some burning with urination. She is due to see her heart failure medical provider in 3 days. She was taken off potassium and magnesium couple of months ago.  Physical exam: Alert, elderly female. She is comfortable. She is eating now. Dysarthria or aphasia. No respiratory distress.  Medical decision-making- nonspecific dizziness, with likely urinary tract infection. Bradycardia, recurrent, with possible slow atrial fibrillation or flutter. There's been no syncope. She is stable for discharge with close follow-up.  Medical screening examination/treatment/procedure(s) were conducted as a shared visit with non-physician practitioner(s) and myself.  I personally evaluated the patient during the encounter   Daleen Bo, MD 09/06/15 2054

## 2015-09-05 NOTE — Telephone Encounter (Signed)
New message   Pt c/o Syncope: STAT if syncope occurred within 30 minutes and pt complains of lightheadedness High Priority if episode of passing out, completely, today or in last 24 hours   1. Did you pass out today? no  2.When is the last time you passed out?  No  3.Has this occurred multiple times? yes 4.Did you have any symptoms prior to passing out? Pt daughter verbalized that she is very dizzy and that she cant stand up  121/69  HR 49

## 2015-09-05 NOTE — ED Notes (Signed)
PA student at bedside.

## 2015-09-05 NOTE — ED Triage Notes (Signed)
Patient presents for dizziness, HA, dysuria x1 day. Abdominal surgery two weeks ago, denies s/s infection to surgical site. Denies fever, N/V, diarrhea. A&O x4.

## 2015-09-05 NOTE — Telephone Encounter (Signed)
Pt complained to daughter the other day of headache and neck stiffness. Thought this initially might have been related to a surgical incision from a tumor removal.  Reseen by oncology last week, noted "everything looked fine" BP, CBG, etc OK.  At 1:30a today she "started to develop dizzy spells" - thought they would wear off. Has continued today and seems like it occurs every time she stands up.  The patient states "it feels like my head wants to fall off of her body". Daughter came in this afternoon to check BP 121/69, HR 49 Urinating OK, BM OK. CBG 183 Notes HR reading obtained on BP cuff.   I advised that due to pt's PAF history a manual pulse would be more accurate.  She rechecked HR manually and was 53 over 1 min. Pt has loop recorder and they can submit information. We discussed this possibility. She noted also that patient has felt noticeably more short of breath today and has required supplement beyond her usual O2 flow rate. I advised w/ today's change in O2 demand and new onset of lightheadedness, near syncope, to have patient evaluated in ED. Caller voiced understanding and thanks.

## 2015-09-06 DIAGNOSIS — N390 Urinary tract infection, site not specified: Secondary | ICD-10-CM | POA: Diagnosis not present

## 2015-09-06 LAB — MAGNESIUM: MAGNESIUM: 1.2 mg/dL — AB (ref 1.7–2.4)

## 2015-09-06 NOTE — Discharge Instructions (Signed)
Make sure to stay hydrated. Stand up slowly. Please follow-up with your cardiologist for further treatment of your bradycardia and orthostatic hypotension. He urine was treated today with the infection the 2 follow-up with your primary care doctor for recheck to make sure infection has resolved. Return to emergency department if worsening symptoms.
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
## 2015-09-09 ENCOUNTER — Encounter (HOSPITAL_COMMUNITY): Payer: Self-pay | Admitting: Nurse Practitioner

## 2015-09-09 ENCOUNTER — Ambulatory Visit (HOSPITAL_COMMUNITY)
Admission: RE | Admit: 2015-09-09 | Discharge: 2015-09-09 | Disposition: A | Discharge status: Home / Self Care | Payer: Medicare HMO | Source: Ambulatory Visit | Attending: Nurse Practitioner | Admitting: Nurse Practitioner

## 2015-09-09 VITALS — BP 110/70 | HR 96 | Ht 62.0 in | Wt 206.2 lb

## 2015-09-09 DIAGNOSIS — I5032 Chronic diastolic (congestive) heart failure: Secondary | ICD-10-CM | POA: Insufficient documentation

## 2015-09-09 DIAGNOSIS — E559 Vitamin D deficiency, unspecified: Secondary | ICD-10-CM | POA: Insufficient documentation

## 2015-09-09 DIAGNOSIS — E1122 Type 2 diabetes mellitus with diabetic chronic kidney disease: Secondary | ICD-10-CM | POA: Insufficient documentation

## 2015-09-09 DIAGNOSIS — F419 Anxiety disorder, unspecified: Secondary | ICD-10-CM | POA: Diagnosis not present

## 2015-09-09 DIAGNOSIS — E039 Hypothyroidism, unspecified: Secondary | ICD-10-CM | POA: Insufficient documentation

## 2015-09-09 DIAGNOSIS — Z833 Family history of diabetes mellitus: Secondary | ICD-10-CM | POA: Diagnosis not present

## 2015-09-09 DIAGNOSIS — Z881 Allergy status to other antibiotic agents status: Secondary | ICD-10-CM | POA: Diagnosis not present

## 2015-09-09 DIAGNOSIS — G4730 Sleep apnea, unspecified: Secondary | ICD-10-CM | POA: Diagnosis not present

## 2015-09-09 DIAGNOSIS — R531 Weakness: Secondary | ICD-10-CM | POA: Diagnosis not present

## 2015-09-09 DIAGNOSIS — Z6837 Body mass index (BMI) 37.0-37.9, adult: Secondary | ICD-10-CM | POA: Diagnosis not present

## 2015-09-09 DIAGNOSIS — Z8249 Family history of ischemic heart disease and other diseases of the circulatory system: Secondary | ICD-10-CM | POA: Insufficient documentation

## 2015-09-09 DIAGNOSIS — F329 Major depressive disorder, single episode, unspecified: Secondary | ICD-10-CM | POA: Insufficient documentation

## 2015-09-09 DIAGNOSIS — I130 Hypertensive heart and chronic kidney disease with heart failure and stage 1 through stage 4 chronic kidney disease, or unspecified chronic kidney disease: Secondary | ICD-10-CM | POA: Insufficient documentation

## 2015-09-09 DIAGNOSIS — M1990 Unspecified osteoarthritis, unspecified site: Secondary | ICD-10-CM | POA: Diagnosis not present

## 2015-09-09 DIAGNOSIS — Z9981 Dependence on supplemental oxygen: Secondary | ICD-10-CM | POA: Insufficient documentation

## 2015-09-09 DIAGNOSIS — Z888 Allergy status to other drugs, medicaments and biological substances status: Secondary | ICD-10-CM | POA: Insufficient documentation

## 2015-09-09 DIAGNOSIS — I252 Old myocardial infarction: Secondary | ICD-10-CM | POA: Insufficient documentation

## 2015-09-09 DIAGNOSIS — N189 Chronic kidney disease, unspecified: Secondary | ICD-10-CM | POA: Diagnosis not present

## 2015-09-09 DIAGNOSIS — I4891 Unspecified atrial fibrillation: Secondary | ICD-10-CM | POA: Diagnosis present

## 2015-09-09 DIAGNOSIS — Z880 Allergy status to penicillin: Secondary | ICD-10-CM | POA: Diagnosis not present

## 2015-09-09 DIAGNOSIS — E1140 Type 2 diabetes mellitus with diabetic neuropathy, unspecified: Secondary | ICD-10-CM | POA: Insufficient documentation

## 2015-09-09 DIAGNOSIS — E669 Obesity, unspecified: Secondary | ICD-10-CM | POA: Diagnosis not present

## 2015-09-09 DIAGNOSIS — I2510 Atherosclerotic heart disease of native coronary artery without angina pectoris: Secondary | ICD-10-CM | POA: Insufficient documentation

## 2015-09-09 DIAGNOSIS — Z79899 Other long term (current) drug therapy: Secondary | ICD-10-CM | POA: Diagnosis not present

## 2015-09-09 DIAGNOSIS — Z885 Allergy status to narcotic agent status: Secondary | ICD-10-CM | POA: Diagnosis not present

## 2015-09-09 DIAGNOSIS — Z7901 Long term (current) use of anticoagulants: Secondary | ICD-10-CM | POA: Insufficient documentation

## 2015-09-09 DIAGNOSIS — K219 Gastro-esophageal reflux disease without esophagitis: Secondary | ICD-10-CM | POA: Insufficient documentation

## 2015-09-09 DIAGNOSIS — Z87442 Personal history of urinary calculi: Secondary | ICD-10-CM | POA: Insufficient documentation

## 2015-09-09 DIAGNOSIS — Z794 Long term (current) use of insulin: Secondary | ICD-10-CM | POA: Insufficient documentation

## 2015-09-09 DIAGNOSIS — K589 Irritable bowel syndrome without diarrhea: Secondary | ICD-10-CM | POA: Insufficient documentation

## 2015-09-09 DIAGNOSIS — E785 Hyperlipidemia, unspecified: Secondary | ICD-10-CM | POA: Insufficient documentation
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
## 2015-09-11 ENCOUNTER — Ambulatory Visit (INDEPENDENT_AMBULATORY_CARE_PROVIDER_SITE_OTHER): Payer: Medicare HMO | Admitting: *Deleted

## 2015-09-11 DIAGNOSIS — I480 Paroxysmal atrial fibrillation: Secondary | ICD-10-CM | POA: Diagnosis not present

## 2015-09-11 NOTE — Progress Notes (Signed)
Carelink Summary Report / Loop Recorder 

## 2015-09-15 LAB — SUSCEPTIBILITY RESULT

## 2015-09-15 LAB — SUSCEPTIBILITY, AER + ANAEROB

## 2015-09-16 LAB — URINE CULTURE: Culture: >=100000 — AB

## 2015-09-17 ENCOUNTER — Telehealth (HOSPITAL_COMMUNITY): Payer: Self-pay

## 2015-09-17 NOTE — Telephone Encounter (Signed)
Post ED Visit - Positive Culture Follow-up  Culture report reviewed by antimicrobial stewardship pharmacist:  []  Elenor Quinones, Pharm.D. []  Heide Guile, Pharm.D., BCPS []  Parks Neptune, Pharm.D. []  Alycia Rossetti, Pharm.D., BCPS []  [place], [place].D., BCPS, AAHIVP []  Legrand Como, Pharm.D., BCPS, AAHIVP []  Milus Glazier, Pharm.D. []  Rob Evette Doffing, Pharm.DBonna Gains Bitontis, Pharm D  Positive urine culture, >/= 100,000 colonies -> Klebsiella Pneumoniae Treated with Fosfomycin x 1 dose, organism sensitive to the same and no further patient follow-up is required at this time.  Chart reviewed by Adolm Joseph PA "No further treatment indicated"  Dortha Kern 09/17/2015, 2:59 PM

## 2015-09-19 ENCOUNTER — Inpatient Hospital Stay (HOSPITAL_COMMUNITY): Admission: RE | Admit: 2015-09-19 | Payer: Medicare HMO | Source: Ambulatory Visit | Admitting: Nurse Practitioner

## 2015-09-23 ENCOUNTER — Encounter (HOSPITAL_COMMUNITY): Payer: Self-pay | Admitting: Nurse Practitioner

## 2015-09-23 ENCOUNTER — Ambulatory Visit (HOSPITAL_COMMUNITY)
Admission: RE | Admit: 2015-09-23 | Discharge: 2015-09-23 | Disposition: A | Discharge status: Home / Self Care | Payer: Medicare HMO | Source: Ambulatory Visit | Attending: Nurse Practitioner | Admitting: Nurse Practitioner

## 2015-09-23 VITALS — BP 124/60 | HR 46 | Ht 62.0 in | Wt 206.4 lb

## 2015-09-23 DIAGNOSIS — I495 Sick sinus syndrome: Secondary | ICD-10-CM | POA: Diagnosis not present

## 2015-09-23 DIAGNOSIS — Z91040 Latex allergy status: Secondary | ICD-10-CM | POA: Insufficient documentation

## 2015-09-23 DIAGNOSIS — E669 Obesity, unspecified: Secondary | ICD-10-CM | POA: Insufficient documentation

## 2015-09-23 DIAGNOSIS — Z6837 Body mass index (BMI) 37.0-37.9, adult: Secondary | ICD-10-CM | POA: Insufficient documentation

## 2015-09-23 DIAGNOSIS — G4730 Sleep apnea, unspecified: Secondary | ICD-10-CM | POA: Diagnosis not present

## 2015-09-23 DIAGNOSIS — E1122 Type 2 diabetes mellitus with diabetic chronic kidney disease: Secondary | ICD-10-CM | POA: Diagnosis not present

## 2015-09-23 DIAGNOSIS — M1990 Unspecified osteoarthritis, unspecified site: Secondary | ICD-10-CM | POA: Insufficient documentation

## 2015-09-23 DIAGNOSIS — E559 Vitamin D deficiency, unspecified: Secondary | ICD-10-CM | POA: Diagnosis not present

## 2015-09-23 DIAGNOSIS — I252 Old myocardial infarction: Secondary | ICD-10-CM | POA: Insufficient documentation

## 2015-09-23 DIAGNOSIS — I2510 Atherosclerotic heart disease of native coronary artery without angina pectoris: Secondary | ICD-10-CM | POA: Diagnosis not present

## 2015-09-23 DIAGNOSIS — I5032 Chronic diastolic (congestive) heart failure: Secondary | ICD-10-CM | POA: Diagnosis not present

## 2015-09-23 DIAGNOSIS — I4891 Unspecified atrial fibrillation: Secondary | ICD-10-CM | POA: Diagnosis present

## 2015-09-23 DIAGNOSIS — Z79899 Other long term (current) drug therapy: Secondary | ICD-10-CM | POA: Diagnosis not present

## 2015-09-23 DIAGNOSIS — Z885 Allergy status to narcotic agent status: Secondary | ICD-10-CM | POA: Diagnosis not present

## 2015-09-23 DIAGNOSIS — I130 Hypertensive heart and chronic kidney disease with heart failure and stage 1 through stage 4 chronic kidney disease, or unspecified chronic kidney disease: Secondary | ICD-10-CM | POA: Diagnosis not present

## 2015-09-23 DIAGNOSIS — N183 Chronic kidney disease, stage 3 (moderate): Secondary | ICD-10-CM | POA: Insufficient documentation

## 2015-09-23 DIAGNOSIS — E039 Hypothyroidism, unspecified: Secondary | ICD-10-CM | POA: Diagnosis not present

## 2015-09-23 DIAGNOSIS — Z9981 Dependence on supplemental oxygen: Secondary | ICD-10-CM | POA: Insufficient documentation

## 2015-09-23 DIAGNOSIS — Z880 Allergy status to penicillin: Secondary | ICD-10-CM | POA: Insufficient documentation

## 2015-09-23 DIAGNOSIS — K219 Gastro-esophageal reflux disease without esophagitis: Secondary | ICD-10-CM | POA: Insufficient documentation

## 2015-09-23 DIAGNOSIS — Z7901 Long term (current) use of anticoagulants: Secondary | ICD-10-CM | POA: Insufficient documentation

## 2015-09-23 DIAGNOSIS — Z888 Allergy status to other drugs, medicaments and biological substances status: Secondary | ICD-10-CM | POA: Insufficient documentation

## 2015-09-23 DIAGNOSIS — Z794 Long term (current) use of insulin: Secondary | ICD-10-CM | POA: Diagnosis not present

## 2015-09-23 DIAGNOSIS — E1140 Type 2 diabetes mellitus with diabetic neuropathy, unspecified: Secondary | ICD-10-CM | POA: Diagnosis not present

## 2015-09-23 DIAGNOSIS — Z905 Acquired absence of kidney: Secondary | ICD-10-CM | POA: Insufficient documentation

## 2015-09-23 DIAGNOSIS — Z87442 Personal history of urinary calculi: Secondary | ICD-10-CM | POA: Insufficient documentation

## 2015-09-23 DIAGNOSIS — Z833 Family history of diabetes mellitus: Secondary | ICD-10-CM | POA: Insufficient documentation

## 2015-09-23 DIAGNOSIS — E785 Hyperlipidemia, unspecified: Secondary | ICD-10-CM | POA: Diagnosis not present

## 2015-09-23 DIAGNOSIS — Z8249 Family history of ischemic heart disease and other diseases of the circulatory system: Secondary | ICD-10-CM | POA: Insufficient documentation

## 2015-09-23 MED ORDER — METOPROLOL TARTRATE 25 MG PO TABS: 12.5000 mg | ORAL_TABLET | Freq: Two times a day (BID) | ORAL | 6 refills | Status: DC
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
## 2015-09-23 NOTE — Addendum Note (Signed)
Encounter addended by: Sherran Needs, NP on: 09/23/2015  3:28 PM<BR>    Actions taken: Sign clinical note

## 2015-09-23 NOTE — Patient Instructions (Signed)
Your physician has recommended you make the following change in your medication:  1)Decrease Metoprolol to 12.5mg  twice a day (half of the 25mg  tablet)

## 2015-10-03 ENCOUNTER — Telehealth: Payer: Self-pay | Admitting: Cardiology

## 2015-10-03 NOTE — Telephone Encounter (Signed)
Spoke w/ pt and requested that she send a manual transmission b/c her home monitor has not updated in at least 14 days.   

## 2015-10-08 LAB — CUP PACEART REMOTE DEVICE CHECK: MDC IDC SESS DTM: 20170816103551

## 2015-10-11 ENCOUNTER — Ambulatory Visit (INDEPENDENT_AMBULATORY_CARE_PROVIDER_SITE_OTHER): Payer: Medicare HMO | Admitting: *Deleted

## 2015-10-11 DIAGNOSIS — I480 Paroxysmal atrial fibrillation: Secondary | ICD-10-CM | POA: Diagnosis not present

## 2015-10-11 NOTE — Progress Notes (Signed)
Carelink Summary Report / Loop Recorder 

## 2015-10-19 ENCOUNTER — Emergency Department (HOSPITAL_COMMUNITY): Payer: Medicare HMO

## 2015-10-19 ENCOUNTER — Emergency Department (HOSPITAL_COMMUNITY)
Admission: EM | Admit: 2015-10-19 | Discharge: 2015-10-19 | Disposition: A | Discharge status: Home / Self Care | Payer: Medicare HMO | Attending: Emergency Medicine | Admitting: Emergency Medicine

## 2015-10-19 ENCOUNTER — Encounter (HOSPITAL_COMMUNITY): Payer: Self-pay | Admitting: *Deleted

## 2015-10-19 DIAGNOSIS — N183 Chronic kidney disease, stage 3 (moderate): Secondary | ICD-10-CM | POA: Insufficient documentation

## 2015-10-19 DIAGNOSIS — I5032 Chronic diastolic (congestive) heart failure: Secondary | ICD-10-CM | POA: Diagnosis not present

## 2015-10-19 DIAGNOSIS — M2550 Pain in unspecified joint: Secondary | ICD-10-CM | POA: Insufficient documentation

## 2015-10-19 DIAGNOSIS — I130 Hypertensive heart and chronic kidney disease with heart failure and stage 1 through stage 4 chronic kidney disease, or unspecified chronic kidney disease: Secondary | ICD-10-CM | POA: Diagnosis not present

## 2015-10-19 DIAGNOSIS — R531 Weakness: Secondary | ICD-10-CM | POA: Insufficient documentation

## 2015-10-19 DIAGNOSIS — E1122 Type 2 diabetes mellitus with diabetic chronic kidney disease: Secondary | ICD-10-CM | POA: Diagnosis not present

## 2015-10-19 DIAGNOSIS — Z8553 Personal history of malignant neoplasm of renal pelvis: Secondary | ICD-10-CM | POA: Insufficient documentation

## 2015-10-19 DIAGNOSIS — J45909 Unspecified asthma, uncomplicated: Secondary | ICD-10-CM | POA: Insufficient documentation

## 2015-10-19 DIAGNOSIS — Z79899 Other long term (current) drug therapy: Secondary | ICD-10-CM | POA: Diagnosis not present

## 2015-10-19 DIAGNOSIS — I2510 Atherosclerotic heart disease of native coronary artery without angina pectoris: Secondary | ICD-10-CM | POA: Insufficient documentation

## 2015-10-19 DIAGNOSIS — R079 Chest pain, unspecified: Secondary | ICD-10-CM | POA: Insufficient documentation

## 2015-10-19 DIAGNOSIS — Z794 Long term (current) use of insulin: Secondary | ICD-10-CM | POA: Diagnosis not present

## 2015-10-19 LAB — CBC WITH DIFFERENTIAL/PLATELET
BASOS ABS: 0.1 10*3/uL (ref 0.0–0.1)
Basophils Relative: 1 %
EOS PCT: 3 %
Eosinophils Absolute: 0.4 10*3/uL (ref 0.0–0.7)
HEMATOCRIT: 38.9 % (ref 36.0–46.0)
HEMOGLOBIN: 12.6 g/dL (ref 12.0–15.0)
LYMPHS PCT: 33 %
Lymphs Abs: 3.5 10*3/uL (ref 0.7–4.0)
MCH: 30.4 pg (ref 26.0–34.0)
MCHC: 32.4 g/dL (ref 30.0–36.0)
MCV: 93.7 fL (ref 78.0–100.0)
Monocytes Absolute: 1.1 10*3/uL — ABNORMAL HIGH (ref 0.1–1.0)
Monocytes Relative: 10 %
NEUTROS ABS: 5.5 10*3/uL (ref 1.7–7.7)
NEUTROS PCT: 53 %
PLATELETS: 363 10*3/uL (ref 150–400)
RBC: 4.15 MIL/uL (ref 3.87–5.11)
RDW: 14.6 % (ref 11.5–15.5)
WBC: 10.5 10*3/uL (ref 4.0–10.5)

## 2015-10-19 LAB — URINALYSIS, ROUTINE W REFLEX MICROSCOPIC
BILIRUBIN URINE: NEGATIVE
Glucose, UA: NEGATIVE mg/dL
Ketones, ur: NEGATIVE mg/dL
Leukocytes, UA: NEGATIVE
Nitrite: NEGATIVE
PH: 6 (ref 5.0–8.0)
Protein, ur: NEGATIVE mg/dL
SPECIFIC GRAVITY, URINE: 1.01 (ref 1.005–1.030)

## 2015-10-19 LAB — COMPREHENSIVE METABOLIC PANEL
ALT: 11 U/L — AB (ref 14–54)
AST: 22 U/L (ref 15–41)
Albumin: 3.4 g/dL — ABNORMAL LOW (ref 3.5–5.0)
Alkaline Phosphatase: 58 U/L (ref 38–126)
Anion gap: 12 (ref 5–15)
BUN: 35 mg/dL — AB (ref 6–20)
CHLORIDE: 95 mmol/L — AB (ref 101–111)
CO2: 28 mmol/L (ref 22–32)
CREATININE: 3.9 mg/dL — AB (ref 0.44–1.00)
Calcium: 10 mg/dL (ref 8.9–10.3)
GFR calc Af Amer: 12 mL/min — ABNORMAL LOW (ref >60)
GFR, EST NON AFRICAN AMERICAN: 11 mL/min — AB (ref >60)
Glucose, Bld: 114 mg/dL — ABNORMAL HIGH (ref 65–99)
Potassium: 3.1 mmol/L — ABNORMAL LOW (ref 3.5–5.1)
Sodium: 135 mmol/L (ref 135–145)
Total Bilirubin: 0.7 mg/dL (ref 0.3–1.2)
Total Protein: 7.7 g/dL (ref 6.5–8.1)

## 2015-10-19 LAB — URINE MICROSCOPIC-ADD ON

## 2015-10-19 LAB — TROPONIN I

## 2015-10-19 MED ORDER — DEXAMETHASONE SODIUM PHOSPHATE 10 MG/ML IJ SOLN
10.0000 mg | Freq: Once | INTRAMUSCULAR | Status: AC
  Administered 2015-10-19: 10 mg via INTRAVENOUS
  Filled 2015-10-19: qty 1

## 2015-10-19 MED ORDER — HYDROMORPHONE HCL 1 MG/ML IJ SOLN
0.7500 mg | Freq: Once | INTRAMUSCULAR | Status: AC
  Administered 2015-10-19: 0.75 mg via INTRAVENOUS
  Filled 2015-10-19: qty 1

## 2015-10-19 MED ORDER — SODIUM CHLORIDE 0.9 % IV BOLUS (SEPSIS)
1000.0000 mL | Freq: Once | INTRAVENOUS | Status: AC
  Administered 2015-10-19: 1000 mL via INTRAVENOUS

## 2015-10-19 MED ORDER — POTASSIUM CHLORIDE CRYS ER 20 MEQ PO TBCR
40.0000 meq | EXTENDED_RELEASE_TABLET | Freq: Once | ORAL | Status: AC
  Administered 2015-10-19: 40 meq via ORAL
  Filled 2015-10-19: qty 2

## 2015-10-19 NOTE — ED Triage Notes (Signed)
Per EMS pt coming from home with c/o failure to thrive x week and a half. Family reports pt unable to ambulate, significantly decreased appetite, swelling and pain in joints. Per EMS pt had a nephrectomy in January, and has been fighting infection since (had another surgery in July). Pt sts "pain everywhere"

## 2015-10-19 NOTE — Discharge Instructions (Signed)
Take 20 meq of potassium tomorrow. The decadron you were given should help with joint pain.  See your Physician for recheck on Monday
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
## 2015-10-19 NOTE — ED Notes (Signed)
Bed: WA06 Expected date:  Expected time:  Means of arrival:  Comments: 73F/joint swelling and pain/8 min ETA

## 2015-10-22 ENCOUNTER — Inpatient Hospital Stay (HOSPITAL_COMMUNITY): Admission: RE | Admit: 2015-10-22 | Payer: Medicare HMO | Source: Ambulatory Visit | Admitting: Nurse Practitioner

## 2015-10-23 ENCOUNTER — Other Ambulatory Visit: Payer: Self-pay

## 2015-10-28 ENCOUNTER — Telehealth (HOSPITAL_COMMUNITY): Payer: Self-pay | Admitting: *Deleted

## 2015-10-28 ENCOUNTER — Encounter: Payer: Self-pay | Admitting: Nurse Practitioner

## 2015-10-28 NOTE — Telephone Encounter (Signed)
[name], [name], called stating pt is having difficulty using left arm, having intermittent chest discomfort and some neck stiffness. She has missed none of her medications including xarelto. Pt on new medication for gout but only taking it half tablet every other day due to the side effects being renal failure which scared pt. Instructed daughter if pt is having chest pain/left arm pain she should report to ER for further evaluation. Pt refuses to go to ER since has appointment tomorrow scheduled for clinic. Daughter believes pt should be admitted to hospital with "all her issues" instructed daughter she will be assessed tomorrow and plan of treatment will be decided upon at that time. Reiterated to take her to ER if chest pain persists and [name] verbalized understanding.

## 2015-10-29 ENCOUNTER — Ambulatory Visit (HOSPITAL_COMMUNITY)
Admission: RE | Admit: 2015-10-29 | Discharge: 2015-10-29 | Disposition: A | Discharge status: Home / Self Care | Payer: Medicare HMO | Source: Ambulatory Visit | Attending: Nurse Practitioner | Admitting: Nurse Practitioner

## 2015-10-29 ENCOUNTER — Encounter (HOSPITAL_COMMUNITY): Payer: Self-pay | Admitting: Nurse Practitioner

## 2015-10-29 VITALS — BP 130/78 | HR 51 | Ht 62.0 in | Wt 204.4 lb

## 2015-10-29 DIAGNOSIS — I10 Essential (primary) hypertension: Secondary | ICD-10-CM

## 2015-10-29 DIAGNOSIS — I5032 Chronic diastolic (congestive) heart failure: Secondary | ICD-10-CM | POA: Diagnosis not present

## 2015-10-29 DIAGNOSIS — J984 Other disorders of lung: Secondary | ICD-10-CM | POA: Diagnosis not present

## 2015-10-29 DIAGNOSIS — Z6837 Body mass index (BMI) 37.0-37.9, adult: Secondary | ICD-10-CM | POA: Diagnosis not present

## 2015-10-29 DIAGNOSIS — R001 Bradycardia, unspecified: Secondary | ICD-10-CM | POA: Diagnosis not present

## 2015-10-29 DIAGNOSIS — M19012 Primary osteoarthritis, left shoulder: Secondary | ICD-10-CM | POA: Diagnosis not present

## 2015-10-29 DIAGNOSIS — Z794 Long term (current) use of insulin: Secondary | ICD-10-CM | POA: Insufficient documentation

## 2015-10-29 DIAGNOSIS — I700 Atherosclerosis of aorta: Secondary | ICD-10-CM | POA: Diagnosis not present

## 2015-10-29 DIAGNOSIS — I4819 Other persistent atrial fibrillation: Secondary | ICD-10-CM

## 2015-10-29 DIAGNOSIS — R0789 Other chest pain: Secondary | ICD-10-CM | POA: Insufficient documentation

## 2015-10-29 DIAGNOSIS — N183 Chronic kidney disease, stage 3 (moderate): Secondary | ICD-10-CM | POA: Insufficient documentation

## 2015-10-29 DIAGNOSIS — Z7901 Long term (current) use of anticoagulants: Secondary | ICD-10-CM | POA: Insufficient documentation

## 2015-10-29 DIAGNOSIS — F329 Major depressive disorder, single episode, unspecified: Secondary | ICD-10-CM | POA: Insufficient documentation

## 2015-10-29 DIAGNOSIS — I481 Persistent atrial fibrillation: Secondary | ICD-10-CM | POA: Diagnosis present

## 2015-10-29 DIAGNOSIS — M25519 Pain in unspecified shoulder: Secondary | ICD-10-CM | POA: Diagnosis not present

## 2015-10-29 DIAGNOSIS — M25512 Pain in left shoulder: Secondary | ICD-10-CM | POA: Diagnosis present

## 2015-10-29 DIAGNOSIS — Z79899 Other long term (current) drug therapy: Secondary | ICD-10-CM | POA: Diagnosis not present

## 2015-10-29 DIAGNOSIS — I130 Hypertensive heart and chronic kidney disease with heart failure and stage 1 through stage 4 chronic kidney disease, or unspecified chronic kidney disease: Secondary | ICD-10-CM | POA: Diagnosis not present

## 2015-10-29 DIAGNOSIS — E039 Hypothyroidism, unspecified: Secondary | ICD-10-CM | POA: Insufficient documentation

## 2015-10-29 DIAGNOSIS — E1122 Type 2 diabetes mellitus with diabetic chronic kidney disease: Secondary | ICD-10-CM | POA: Diagnosis not present
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
## 2015-10-31 ENCOUNTER — Other Ambulatory Visit (HOSPITAL_COMMUNITY): Payer: Self-pay | Admitting: Nurse Practitioner

## 2015-10-31 ENCOUNTER — Other Ambulatory Visit (HOSPITAL_COMMUNITY): Payer: Self-pay | Admitting: *Deleted

## 2015-11-02 LAB — CUP PACEART REMOTE DEVICE CHECK: MDC IDC SESS DTM: 20170915103654

## 2015-11-02 NOTE — Progress Notes (Signed)
Carelink summary report received. Battery status OK. Normal device function. No new symptom episodes, tachy episodes, brady, or pause episodes. No new AF episodes. Monthly summary reports and ROV/PRN 

## 2015-11-11 ENCOUNTER — Ambulatory Visit (INDEPENDENT_AMBULATORY_CARE_PROVIDER_SITE_OTHER): Payer: Medicare HMO | Admitting: *Deleted

## 2015-11-11 DIAGNOSIS — I480 Paroxysmal atrial fibrillation: Secondary | ICD-10-CM

## 2015-11-11 NOTE — Progress Notes (Signed)
Carelink Summary Report / Loop Recorder 

## 2015-11-27 ENCOUNTER — Inpatient Hospital Stay (HOSPITAL_COMMUNITY): Admission: RE | Admit: 2015-11-27 | Payer: Medicare HMO | Source: Ambulatory Visit | Admitting: Nurse Practitioner

## 2015-12-02 ENCOUNTER — Ambulatory Visit (HOSPITAL_COMMUNITY)
Admission: RE | Admit: 2015-12-02 | Discharge: 2015-12-02 | Disposition: A | Discharge status: Home / Self Care | Payer: Medicare HMO | Source: Ambulatory Visit | Attending: Nurse Practitioner | Admitting: Nurse Practitioner

## 2015-12-02 ENCOUNTER — Encounter (HOSPITAL_COMMUNITY): Payer: Self-pay | Admitting: Nurse Practitioner

## 2015-12-02 VITALS — BP 134/62 | HR 54 | Ht 62.0 in | Wt 212.0 lb

## 2015-12-02 DIAGNOSIS — I4891 Unspecified atrial fibrillation: Secondary | ICD-10-CM | POA: Diagnosis not present

## 2015-12-02 DIAGNOSIS — Z888 Allergy status to other drugs, medicaments and biological substances status: Secondary | ICD-10-CM | POA: Insufficient documentation

## 2015-12-02 DIAGNOSIS — N189 Chronic kidney disease, unspecified: Secondary | ICD-10-CM | POA: Diagnosis not present

## 2015-12-02 DIAGNOSIS — E1140 Type 2 diabetes mellitus with diabetic neuropathy, unspecified: Secondary | ICD-10-CM | POA: Insufficient documentation

## 2015-12-02 DIAGNOSIS — K589 Irritable bowel syndrome without diarrhea: Secondary | ICD-10-CM | POA: Insufficient documentation

## 2015-12-02 DIAGNOSIS — E785 Hyperlipidemia, unspecified: Secondary | ICD-10-CM | POA: Diagnosis not present

## 2015-12-02 DIAGNOSIS — Z8249 Family history of ischemic heart disease and other diseases of the circulatory system: Secondary | ICD-10-CM | POA: Insufficient documentation

## 2015-12-02 DIAGNOSIS — Z794 Long term (current) use of insulin: Secondary | ICD-10-CM | POA: Diagnosis not present

## 2015-12-02 DIAGNOSIS — Z880 Allergy status to penicillin: Secondary | ICD-10-CM | POA: Diagnosis not present

## 2015-12-02 DIAGNOSIS — M109 Gout, unspecified: Secondary | ICD-10-CM | POA: Diagnosis not present

## 2015-12-02 DIAGNOSIS — E669 Obesity, unspecified: Secondary | ICD-10-CM | POA: Diagnosis not present

## 2015-12-02 DIAGNOSIS — I481 Persistent atrial fibrillation: Secondary | ICD-10-CM | POA: Diagnosis present

## 2015-12-02 DIAGNOSIS — F419 Anxiety disorder, unspecified: Secondary | ICD-10-CM | POA: Insufficient documentation

## 2015-12-02 DIAGNOSIS — I252 Old myocardial infarction: Secondary | ICD-10-CM | POA: Diagnosis not present

## 2015-12-02 DIAGNOSIS — I2510 Atherosclerotic heart disease of native coronary artery without angina pectoris: Secondary | ICD-10-CM | POA: Diagnosis not present

## 2015-12-02 DIAGNOSIS — I4819 Other persistent atrial fibrillation: Secondary | ICD-10-CM

## 2015-12-02 DIAGNOSIS — E559 Vitamin D deficiency, unspecified: Secondary | ICD-10-CM | POA: Insufficient documentation

## 2015-12-02 DIAGNOSIS — Z9981 Dependence on supplemental oxygen: Secondary | ICD-10-CM | POA: Insufficient documentation

## 2015-12-02 DIAGNOSIS — Z79899 Other long term (current) drug therapy: Secondary | ICD-10-CM | POA: Diagnosis not present

## 2015-12-02 DIAGNOSIS — K219 Gastro-esophageal reflux disease without esophagitis: Secondary | ICD-10-CM | POA: Insufficient documentation

## 2015-12-02 DIAGNOSIS — G4730 Sleep apnea, unspecified: Secondary | ICD-10-CM | POA: Insufficient documentation

## 2015-12-02 DIAGNOSIS — E039 Hypothyroidism, unspecified: Secondary | ICD-10-CM | POA: Insufficient documentation

## 2015-12-02 DIAGNOSIS — Z885 Allergy status to narcotic agent status: Secondary | ICD-10-CM | POA: Insufficient documentation

## 2015-12-02 DIAGNOSIS — I428 Other cardiomyopathies: Secondary | ICD-10-CM | POA: Insufficient documentation

## 2015-12-02 DIAGNOSIS — Z7901 Long term (current) use of anticoagulants: Secondary | ICD-10-CM | POA: Insufficient documentation

## 2015-12-02 DIAGNOSIS — E1122 Type 2 diabetes mellitus with diabetic chronic kidney disease: Secondary | ICD-10-CM | POA: Insufficient documentation

## 2015-12-02 DIAGNOSIS — Z6838 Body mass index (BMI) 38.0-38.9, adult: Secondary | ICD-10-CM | POA: Diagnosis not present

## 2015-12-02 DIAGNOSIS — F329 Major depressive disorder, single episode, unspecified: Secondary | ICD-10-CM | POA: Insufficient documentation

## 2015-12-02 DIAGNOSIS — M1990 Unspecified osteoarthritis, unspecified site: Secondary | ICD-10-CM | POA: Diagnosis not present

## 2015-12-02 DIAGNOSIS — Z833 Family history of diabetes mellitus: Secondary | ICD-10-CM | POA: Diagnosis not present

## 2015-12-02 LAB — BASIC METABOLIC PANEL
ANION GAP: 13 (ref 5–15)
BUN: 24 mg/dL — ABNORMAL HIGH (ref 6–20)
CALCIUM: 10.1 mg/dL (ref 8.9–10.3)
CO2: 26 mmol/L (ref 22–32)
Chloride: 100 mmol/L — ABNORMAL LOW (ref 101–111)
Creatinine, Ser: 3.06 mg/dL — ABNORMAL HIGH (ref 0.44–1.00)
GFR, EST AFRICAN AMERICAN: 16 mL/min — AB (ref >60)
GFR, EST NON AFRICAN AMERICAN: 14 mL/min — AB (ref >60)
Glucose, Bld: 118 mg/dL — ABNORMAL HIGH (ref 65–99)
POTASSIUM: 3.8 mmol/L (ref 3.5–5.1)
SODIUM: 139 mmol/L (ref 135–145)

## 2015-12-02 LAB — MAGNESIUM: MAGNESIUM: 1.9 mg/dL (ref 1.7–2.4)
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
## 2015-12-03 ENCOUNTER — Telehealth (HOSPITAL_COMMUNITY): Payer: Self-pay | Admitting: *Deleted

## 2015-12-03 NOTE — Telephone Encounter (Signed)
Left msg for [name] to call back to sched a f/u EKG and amiodarone level in 7-10 days.  Waiting for clbk

## 2015-12-06 ENCOUNTER — Telehealth: Payer: Self-pay | Admitting: Cardiology

## 2015-12-06 NOTE — Telephone Encounter (Signed)
Spoke w/ pt and requested that she send a manual transmission b/c her home monitor has not updated in at least 14 days.   

## 2015-12-10 ENCOUNTER — Ambulatory Visit (INDEPENDENT_AMBULATORY_CARE_PROVIDER_SITE_OTHER): Payer: Medicare HMO | Admitting: *Deleted

## 2015-12-10 DIAGNOSIS — I480 Paroxysmal atrial fibrillation: Secondary | ICD-10-CM | POA: Diagnosis not present

## 2015-12-10 NOTE — Progress Notes (Signed)
Carelink Summary Report / Loop Recorder 

## 2015-12-14 LAB — CUP PACEART REMOTE DEVICE CHECK
MDC IDC PG IMPLANT DT: 20141216
MDC IDC SESS DTM: 20171015113642

## 2015-12-14 NOTE — Progress Notes (Signed)
Carelink summary report received. Battery status OK. Normal device function. No new symptom episodes, tachy episodes, brady, or pause episodes. No new AF episodes. Monthly summary reports and ROV/PRN 

## 2015-12-16 ENCOUNTER — Ambulatory Visit (HOSPITAL_COMMUNITY)
Admission: RE | Admit: 2015-12-16 | Discharge: 2015-12-16 | Disposition: A | Discharge status: Home / Self Care | Payer: Medicare HMO | Source: Ambulatory Visit | Attending: Nurse Practitioner | Admitting: Nurse Practitioner

## 2015-12-16 DIAGNOSIS — I480 Paroxysmal atrial fibrillation: Secondary | ICD-10-CM | POA: Diagnosis not present

## 2015-12-16 DIAGNOSIS — Z5181 Encounter for therapeutic drug level monitoring: Secondary | ICD-10-CM | POA: Diagnosis not present

## 2015-12-16 DIAGNOSIS — I4891 Unspecified atrial fibrillation: Secondary | ICD-10-CM | POA: Insufficient documentation

## 2015-12-16 DIAGNOSIS — Z79899 Other long term (current) drug therapy: Secondary | ICD-10-CM | POA: Diagnosis not present

## 2015-12-16 NOTE — Progress Notes (Addendum)
Pt in for repeat ekg and to check amiodarone level. Roderic Palau NP to review EKG.  Pt appears to be in afib today at 87 bpm. Prolonged qtc last week but in afib it is 445 ms. Amiodarone level  Pending. Linq reviewed and afib burden 1.5 % Max HR of 105 bpm. No significant brady/tachy episodes. Will recheck EKG 3 weeks.

## 2015-12-17 LAB — AMIODARONE LEVEL
AMIODARONE LVL: 1.1 ug/mL (ref 1.0–2.5)
N-DESETHYL-AMIODARONE: 0.7 ug/mL — AB (ref 1.0–2.5)

## 2015-12-27 ENCOUNTER — Encounter (HOSPITAL_COMMUNITY): Payer: Self-pay | Admitting: *Deleted

## 2016-01-09 ENCOUNTER — Ambulatory Visit (INDEPENDENT_AMBULATORY_CARE_PROVIDER_SITE_OTHER): Payer: Medicare HMO | Admitting: *Deleted

## 2016-01-09 DIAGNOSIS — I480 Paroxysmal atrial fibrillation: Secondary | ICD-10-CM | POA: Diagnosis not present

## 2016-01-09 NOTE — Progress Notes (Signed)
Carelink Summary Report / Loop Recorder 

## 2016-01-23 LAB — CUP PACEART REMOTE DEVICE CHECK
Date Time Interrogation Session: 20171114123634
Implantable Pulse Generator Implant Date: 20141216

## 2016-01-29 ENCOUNTER — Other Ambulatory Visit (HOSPITAL_COMMUNITY): Payer: Self-pay | Admitting: Nurse Practitioner

## 2016-02-10 ENCOUNTER — Ambulatory Visit (INDEPENDENT_AMBULATORY_CARE_PROVIDER_SITE_OTHER): Payer: Medicare HMO | Admitting: *Deleted

## 2016-02-10 ENCOUNTER — Ambulatory Visit (HOSPITAL_COMMUNITY)
Admission: RE | Admit: 2016-02-10 | Discharge: 2016-02-10 | Disposition: A | Discharge status: Home / Self Care | Payer: Medicare HMO | Source: Ambulatory Visit | Attending: Nurse Practitioner | Admitting: Nurse Practitioner

## 2016-02-10 VITALS — BP 112/60 | HR 56 | Ht 62.0 in | Wt 218.6 lb

## 2016-02-10 DIAGNOSIS — Z888 Allergy status to other drugs, medicaments and biological substances status: Secondary | ICD-10-CM | POA: Insufficient documentation

## 2016-02-10 DIAGNOSIS — Z9981 Dependence on supplemental oxygen: Secondary | ICD-10-CM | POA: Insufficient documentation

## 2016-02-10 DIAGNOSIS — I480 Paroxysmal atrial fibrillation: Secondary | ICD-10-CM

## 2016-02-10 DIAGNOSIS — Z880 Allergy status to penicillin: Secondary | ICD-10-CM | POA: Diagnosis not present

## 2016-02-10 DIAGNOSIS — E1122 Type 2 diabetes mellitus with diabetic chronic kidney disease: Secondary | ICD-10-CM | POA: Insufficient documentation

## 2016-02-10 DIAGNOSIS — Z85528 Personal history of other malignant neoplasm of kidney: Secondary | ICD-10-CM | POA: Diagnosis not present

## 2016-02-10 DIAGNOSIS — Z905 Acquired absence of kidney: Secondary | ICD-10-CM | POA: Diagnosis not present

## 2016-02-10 DIAGNOSIS — Z833 Family history of diabetes mellitus: Secondary | ICD-10-CM | POA: Diagnosis not present

## 2016-02-10 DIAGNOSIS — Z8249 Family history of ischemic heart disease and other diseases of the circulatory system: Secondary | ICD-10-CM | POA: Insufficient documentation

## 2016-02-10 DIAGNOSIS — K3184 Gastroparesis: Secondary | ICD-10-CM | POA: Insufficient documentation

## 2016-02-10 DIAGNOSIS — I130 Hypertensive heart and chronic kidney disease with heart failure and stage 1 through stage 4 chronic kidney disease, or unspecified chronic kidney disease: Secondary | ICD-10-CM | POA: Insufficient documentation

## 2016-02-10 DIAGNOSIS — E039 Hypothyroidism, unspecified: Secondary | ICD-10-CM | POA: Insufficient documentation

## 2016-02-10 DIAGNOSIS — M109 Gout, unspecified: Secondary | ICD-10-CM | POA: Insufficient documentation

## 2016-02-10 DIAGNOSIS — Z7901 Long term (current) use of anticoagulants: Secondary | ICD-10-CM | POA: Diagnosis not present

## 2016-02-10 DIAGNOSIS — Z885 Allergy status to narcotic agent status: Secondary | ICD-10-CM | POA: Insufficient documentation

## 2016-02-10 DIAGNOSIS — Z91040 Latex allergy status: Secondary | ICD-10-CM | POA: Diagnosis not present

## 2016-02-10 DIAGNOSIS — I252 Old myocardial infarction: Secondary | ICD-10-CM | POA: Diagnosis not present

## 2016-02-10 DIAGNOSIS — K219 Gastro-esophageal reflux disease without esophagitis: Secondary | ICD-10-CM | POA: Insufficient documentation

## 2016-02-10 DIAGNOSIS — I481 Persistent atrial fibrillation: Secondary | ICD-10-CM

## 2016-02-10 DIAGNOSIS — Z803 Family history of malignant neoplasm of breast: Secondary | ICD-10-CM | POA: Diagnosis not present

## 2016-02-10 DIAGNOSIS — F419 Anxiety disorder, unspecified: Secondary | ICD-10-CM | POA: Insufficient documentation

## 2016-02-10 DIAGNOSIS — E669 Obesity, unspecified: Secondary | ICD-10-CM | POA: Diagnosis not present

## 2016-02-10 DIAGNOSIS — Z800 Family history of malignant neoplasm of digestive organs: Secondary | ICD-10-CM | POA: Diagnosis not present

## 2016-02-10 DIAGNOSIS — Z8371 Family history of colonic polyps: Secondary | ICD-10-CM | POA: Insufficient documentation

## 2016-02-10 DIAGNOSIS — Z794 Long term (current) use of insulin: Secondary | ICD-10-CM | POA: Insufficient documentation

## 2016-02-10 DIAGNOSIS — I428 Other cardiomyopathies: Secondary | ICD-10-CM | POA: Diagnosis not present

## 2016-02-10 DIAGNOSIS — Z79899 Other long term (current) drug therapy: Secondary | ICD-10-CM | POA: Diagnosis not present

## 2016-02-10 DIAGNOSIS — G4730 Sleep apnea, unspecified: Secondary | ICD-10-CM | POA: Insufficient documentation

## 2016-02-10 DIAGNOSIS — I4891 Unspecified atrial fibrillation: Secondary | ICD-10-CM | POA: Insufficient documentation

## 2016-02-10 DIAGNOSIS — M1990 Unspecified osteoarthritis, unspecified site: Secondary | ICD-10-CM | POA: Insufficient documentation

## 2016-02-10 DIAGNOSIS — I4819 Other persistent atrial fibrillation: Secondary | ICD-10-CM

## 2016-02-10 DIAGNOSIS — N183 Chronic kidney disease, stage 3 (moderate): Secondary | ICD-10-CM | POA: Insufficient documentation

## 2016-02-10 DIAGNOSIS — Z6839 Body mass index (BMI) 39.0-39.9, adult: Secondary | ICD-10-CM | POA: Insufficient documentation

## 2016-02-10 DIAGNOSIS — K589 Irritable bowel syndrome without diarrhea: Secondary | ICD-10-CM | POA: Insufficient documentation

## 2016-02-10 DIAGNOSIS — E1143 Type 2 diabetes mellitus with diabetic autonomic (poly)neuropathy: Secondary | ICD-10-CM | POA: Insufficient documentation

## 2016-02-10 DIAGNOSIS — I5032 Chronic diastolic (congestive) heart failure: Secondary | ICD-10-CM | POA: Insufficient documentation

## 2016-02-10 DIAGNOSIS — Z881 Allergy status to other antibiotic agents status: Secondary | ICD-10-CM | POA: Insufficient documentation

## 2016-02-10 DIAGNOSIS — E785 Hyperlipidemia, unspecified: Secondary | ICD-10-CM | POA: Insufficient documentation

## 2016-02-10 DIAGNOSIS — E559 Vitamin D deficiency, unspecified: Secondary | ICD-10-CM | POA: Insufficient documentation

## 2016-02-10 DIAGNOSIS — J45909 Unspecified asthma, uncomplicated: Secondary | ICD-10-CM | POA: Insufficient documentation

## 2016-02-10 LAB — COMPREHENSIVE METABOLIC PANEL
ALT: 20 U/L (ref 14–54)
AST: 39 U/L (ref 15–41)
Albumin: 3.4 g/dL — ABNORMAL LOW (ref 3.5–5.0)
Alkaline Phosphatase: 65 U/L (ref 38–126)
Anion gap: 13 (ref 5–15)
BILIRUBIN TOTAL: 0.8 mg/dL (ref 0.3–1.2)
BUN: 29 mg/dL — AB (ref 6–20)
CO2: 26 mmol/L (ref 22–32)
Calcium: 10 mg/dL (ref 8.9–10.3)
Chloride: 99 mmol/L — ABNORMAL LOW (ref 101–111)
Creatinine, Ser: 3.18 mg/dL — ABNORMAL HIGH (ref 0.44–1.00)
GFR, EST AFRICAN AMERICAN: 16 mL/min — AB (ref >60)
GFR, EST NON AFRICAN AMERICAN: 13 mL/min — AB (ref >60)
Glucose, Bld: 82 mg/dL (ref 65–99)
POTASSIUM: 3.5 mmol/L (ref 3.5–5.1)
Sodium: 138 mmol/L (ref 135–145)
TOTAL PROTEIN: 7.1 g/dL (ref 6.5–8.1)

## 2016-02-10 LAB — TSH: TSH: 2.802 u[IU]/mL (ref 0.350–4.500)
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
## 2016-02-11 NOTE — Progress Notes (Signed)
Carelink Summary Report 

## 2016-02-24 ENCOUNTER — Other Ambulatory Visit: Payer: Self-pay | Admitting: Urology

## 2016-02-24 ENCOUNTER — Ambulatory Visit (HOSPITAL_COMMUNITY)
Admission: RE | Admit: 2016-02-24 | Discharge: 2016-02-24 | Disposition: A | Discharge status: Home / Self Care | Payer: Medicare HMO | Source: Ambulatory Visit | Attending: Urology | Admitting: Urology

## 2016-02-24 DIAGNOSIS — C641 Malignant neoplasm of right kidney, except renal pelvis: Secondary | ICD-10-CM

## 2016-02-24 DIAGNOSIS — R937 Abnormal findings on diagnostic imaging of other parts of musculoskeletal system: Secondary | ICD-10-CM | POA: Insufficient documentation

## 2016-03-01 LAB — CUP PACEART REMOTE DEVICE CHECK
Date Time Interrogation Session: 20171214123811
MDC IDC PG IMPLANT DT: 20141216

## 2016-03-01 NOTE — Progress Notes (Signed)
Carelink summary report received. Battery status OK. Normal device function. No new symptom episodes, tachy episodes, brady, or pause episodes. 24 AF 1.8% +Xarelto. Monthly summary reports and ROV/PRN

## 2016-03-05 ENCOUNTER — Ambulatory Visit (HOSPITAL_COMMUNITY): Payer: Medicare HMO | Admitting: Nurse Practitioner

## 2016-03-09 ENCOUNTER — Ambulatory Visit (INDEPENDENT_AMBULATORY_CARE_PROVIDER_SITE_OTHER): Payer: Medicare HMO | Admitting: *Deleted

## 2016-03-09 DIAGNOSIS — I480 Paroxysmal atrial fibrillation: Secondary | ICD-10-CM | POA: Diagnosis not present

## 2016-03-10 NOTE — Progress Notes (Signed)
Carelink Summary Report / Loop Recorder 

## 2016-03-12 ENCOUNTER — Telehealth: Payer: Self-pay | Admitting: *Deleted

## 2016-03-12 LAB — CUP PACEART REMOTE DEVICE CHECK
Date Time Interrogation Session: 20180212130820
Implantable Pulse Generator Implant Date: 20141216
MDC IDC PG IMPLANT DT: 20141216
MDC IDC SESS DTM: 20180113130544

## 2016-03-12 NOTE — Telephone Encounter (Signed)
Spoke with patient.  ILR (LINQ) reached RRT as of 02/21/16.  Discussed various scheduling options with patient, who states she does not want her ILR explanted at this time.  She declines an appointment with Dr. Rayann Heman at the [facility] office as she sees the AF Clinic.  Patient's next f/u with the AF Clinic is in 04/2016.  Patient is aware she will receive a letter to schedule this appointment.  She is appreciative of call and denies additional questions or concerns at this time.

## 2016-03-18 ENCOUNTER — Other Ambulatory Visit (HOSPITAL_COMMUNITY): Payer: Self-pay | Admitting: Nurse Practitioner

## 2016-03-24 ENCOUNTER — Other Ambulatory Visit: Payer: Self-pay | Admitting: Cardiology

## 2016-04-07 ENCOUNTER — Other Ambulatory Visit (HOSPITAL_COMMUNITY): Payer: Self-pay | Admitting: *Deleted

## 2016-04-07 MED ORDER — METOPROLOL TARTRATE 25 MG PO TABS: 12.5000 mg | ORAL_TABLET | Freq: Two times a day (BID) | ORAL | 3 refills | Status: DC

## 2016-04-07 MED ORDER — POTASSIUM CHLORIDE CRYS ER 20 MEQ PO TBCR: 10.0000 meq | EXTENDED_RELEASE_TABLET | Freq: Every day | ORAL | 3 refills | Status: DC

## 2016-04-09 ENCOUNTER — Other Ambulatory Visit: Payer: Self-pay | Admitting: Internal Medicine

## 2016-04-14 ENCOUNTER — Other Ambulatory Visit: Payer: Self-pay | Admitting: Cardiology

## 2016-04-14 NOTE — Telephone Encounter (Signed)
New message   Pt is calling because her pharmacy stated they needed authorization from Dr. [name] to fill this. Pt states that pharmacy has sent over request.     *STAT* If patient is at the pharmacy, call can be transferred to refill team.   1. Which medications need to be refilled? (please list name of each medication and dose if known) simvastatin 20 mg   2. Which pharmacy/location (including street and city if local pharmacy) is medication to be sent to? [place] [phone]  3. Do they need a 30 day or 90 day supply? 90 day

## 2016-04-15 MED ORDER — SIMVASTATIN 20 MG PO TABS: 20.0000 mg | ORAL_TABLET | Freq: Every day | ORAL | 0 refills | Status: DC

## 2016-04-15 NOTE — Telephone Encounter (Signed)
Rx(s) sent to pharmacy electronically.  

## 2016-05-06 ENCOUNTER — Inpatient Hospital Stay (HOSPITAL_COMMUNITY): Admission: RE | Admit: 2016-05-06 | Payer: Medicare HMO | Source: Ambulatory Visit | Admitting: Nurse Practitioner

## 2016-05-08 ENCOUNTER — Encounter: Payer: Self-pay | Admitting: Cardiology

## 2016-05-19 ENCOUNTER — Telehealth: Payer: Self-pay | Admitting: Cardiology

## 2016-05-19 NOTE — Telephone Encounter (Signed)
Received records from [facility] for appointment on 05/27/16 with Dr [name].  Records put with Dr Doug Sou schedule for 05/27/16. lp
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
## 2016-05-27 ENCOUNTER — Other Ambulatory Visit: Payer: Self-pay

## 2016-05-27 ENCOUNTER — Encounter: Payer: Self-pay | Admitting: Cardiology

## 2016-05-27 ENCOUNTER — Ambulatory Visit (INDEPENDENT_AMBULATORY_CARE_PROVIDER_SITE_OTHER): Payer: Medicare HMO | Admitting: Cardiology

## 2016-05-27 VITALS — BP 130/68 | HR 86 | Ht 62.0 in | Wt 227.0 lb

## 2016-05-27 DIAGNOSIS — M25512 Pain in left shoulder: Secondary | ICD-10-CM | POA: Diagnosis not present

## 2016-05-27 DIAGNOSIS — I10 Essential (primary) hypertension: Secondary | ICD-10-CM | POA: Diagnosis not present

## 2016-05-27 DIAGNOSIS — I480 Paroxysmal atrial fibrillation: Secondary | ICD-10-CM

## 2016-05-27 DIAGNOSIS — I5032 Chronic diastolic (congestive) heart failure: Secondary | ICD-10-CM | POA: Diagnosis not present

## 2016-05-27 DIAGNOSIS — G8929 Other chronic pain: Secondary | ICD-10-CM

## 2016-05-27 MED ORDER — SIMVASTATIN 20 MG PO TABS: 20.0000 mg | ORAL_TABLET | Freq: Every day | ORAL | 3 refills | Status: DC

## 2016-05-27 NOTE — Patient Instructions (Signed)
Continue your current therapy  I will see you in 6 months.   

## 2016-07-02 IMAGING — CR DG FOOT COMPLETE 3+V*L*
3 series · 3 of 3 positions shown · non-contrast
Comparison: None.

CLINICAL DATA: Left hindfoot/heel pain.

EXAM:
LEFT FOOT - COMPLETE 3+ VIEW

[x foot ap left]
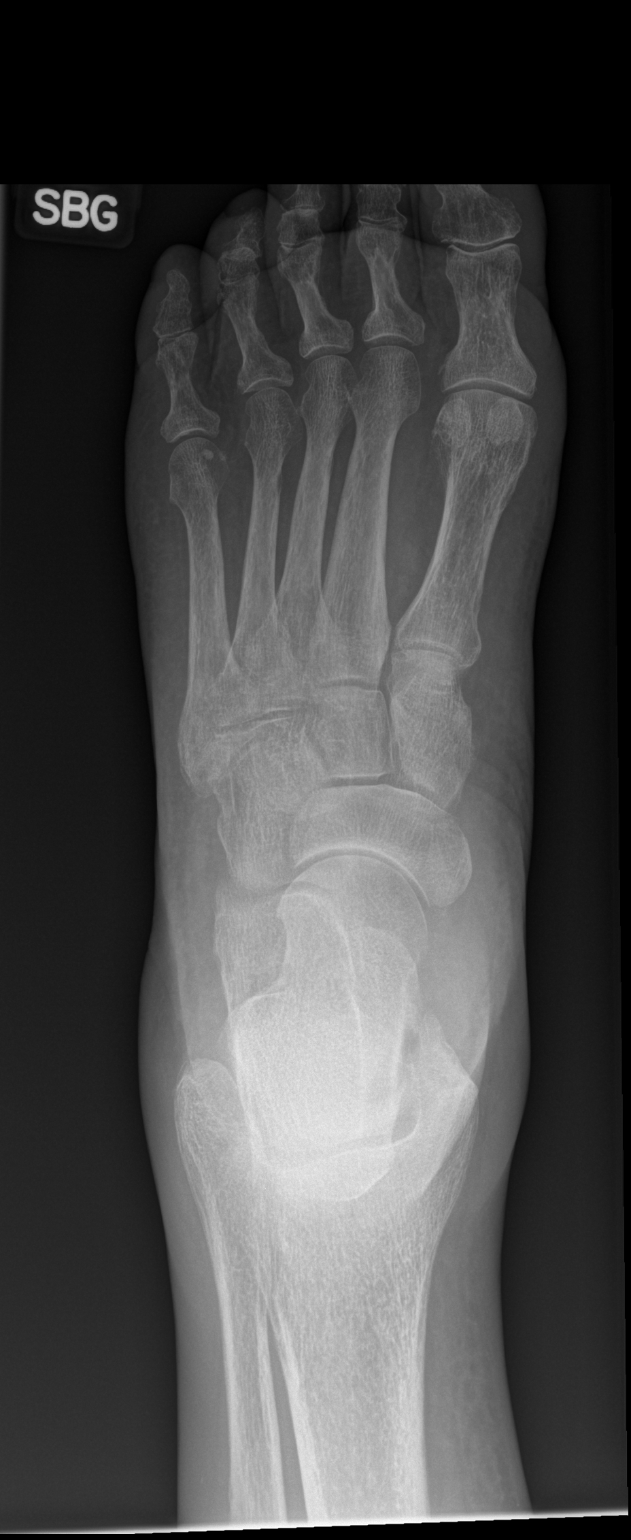

[x foot obl left]
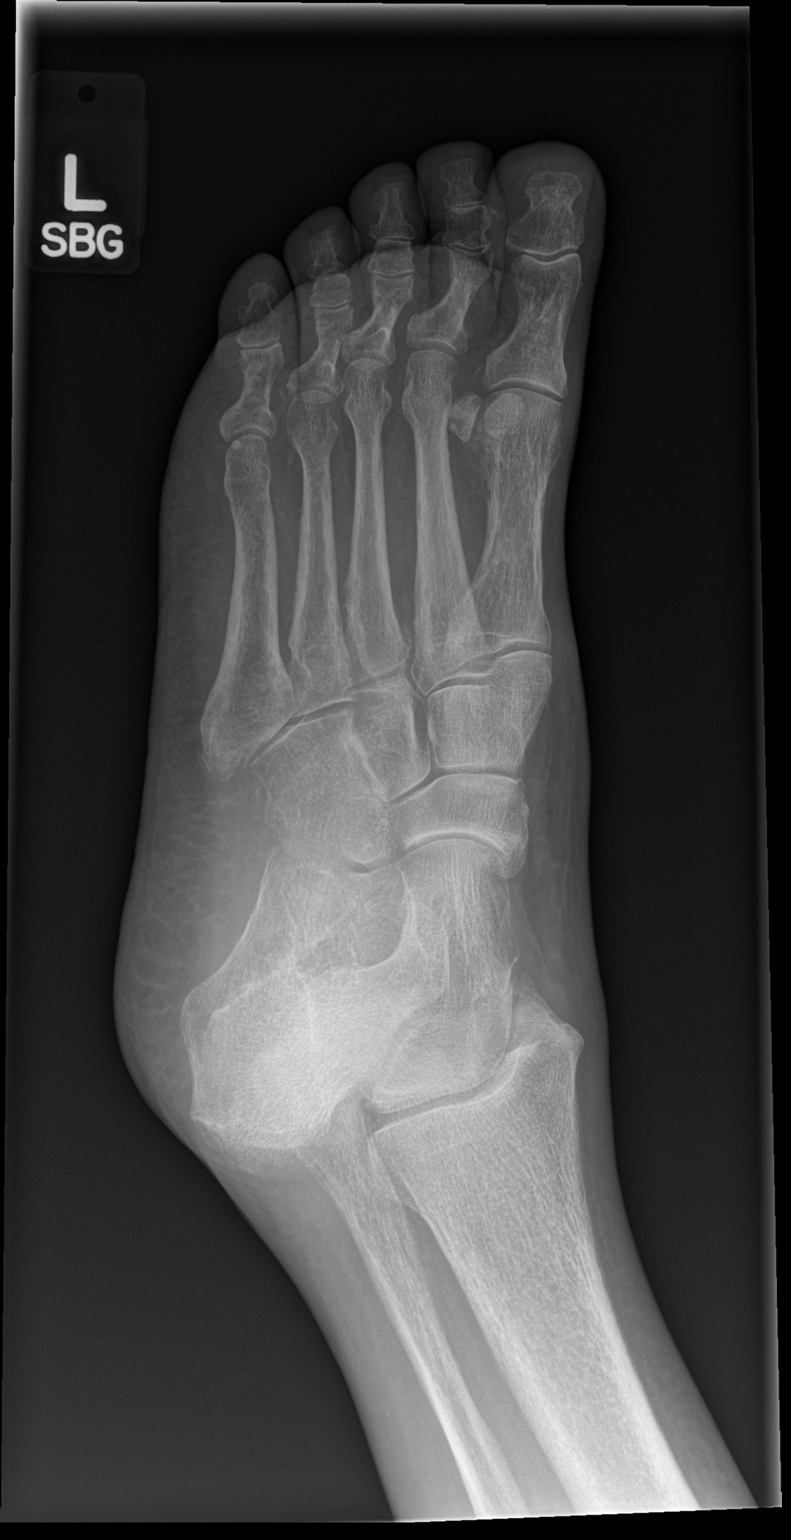

[x foot lat left]
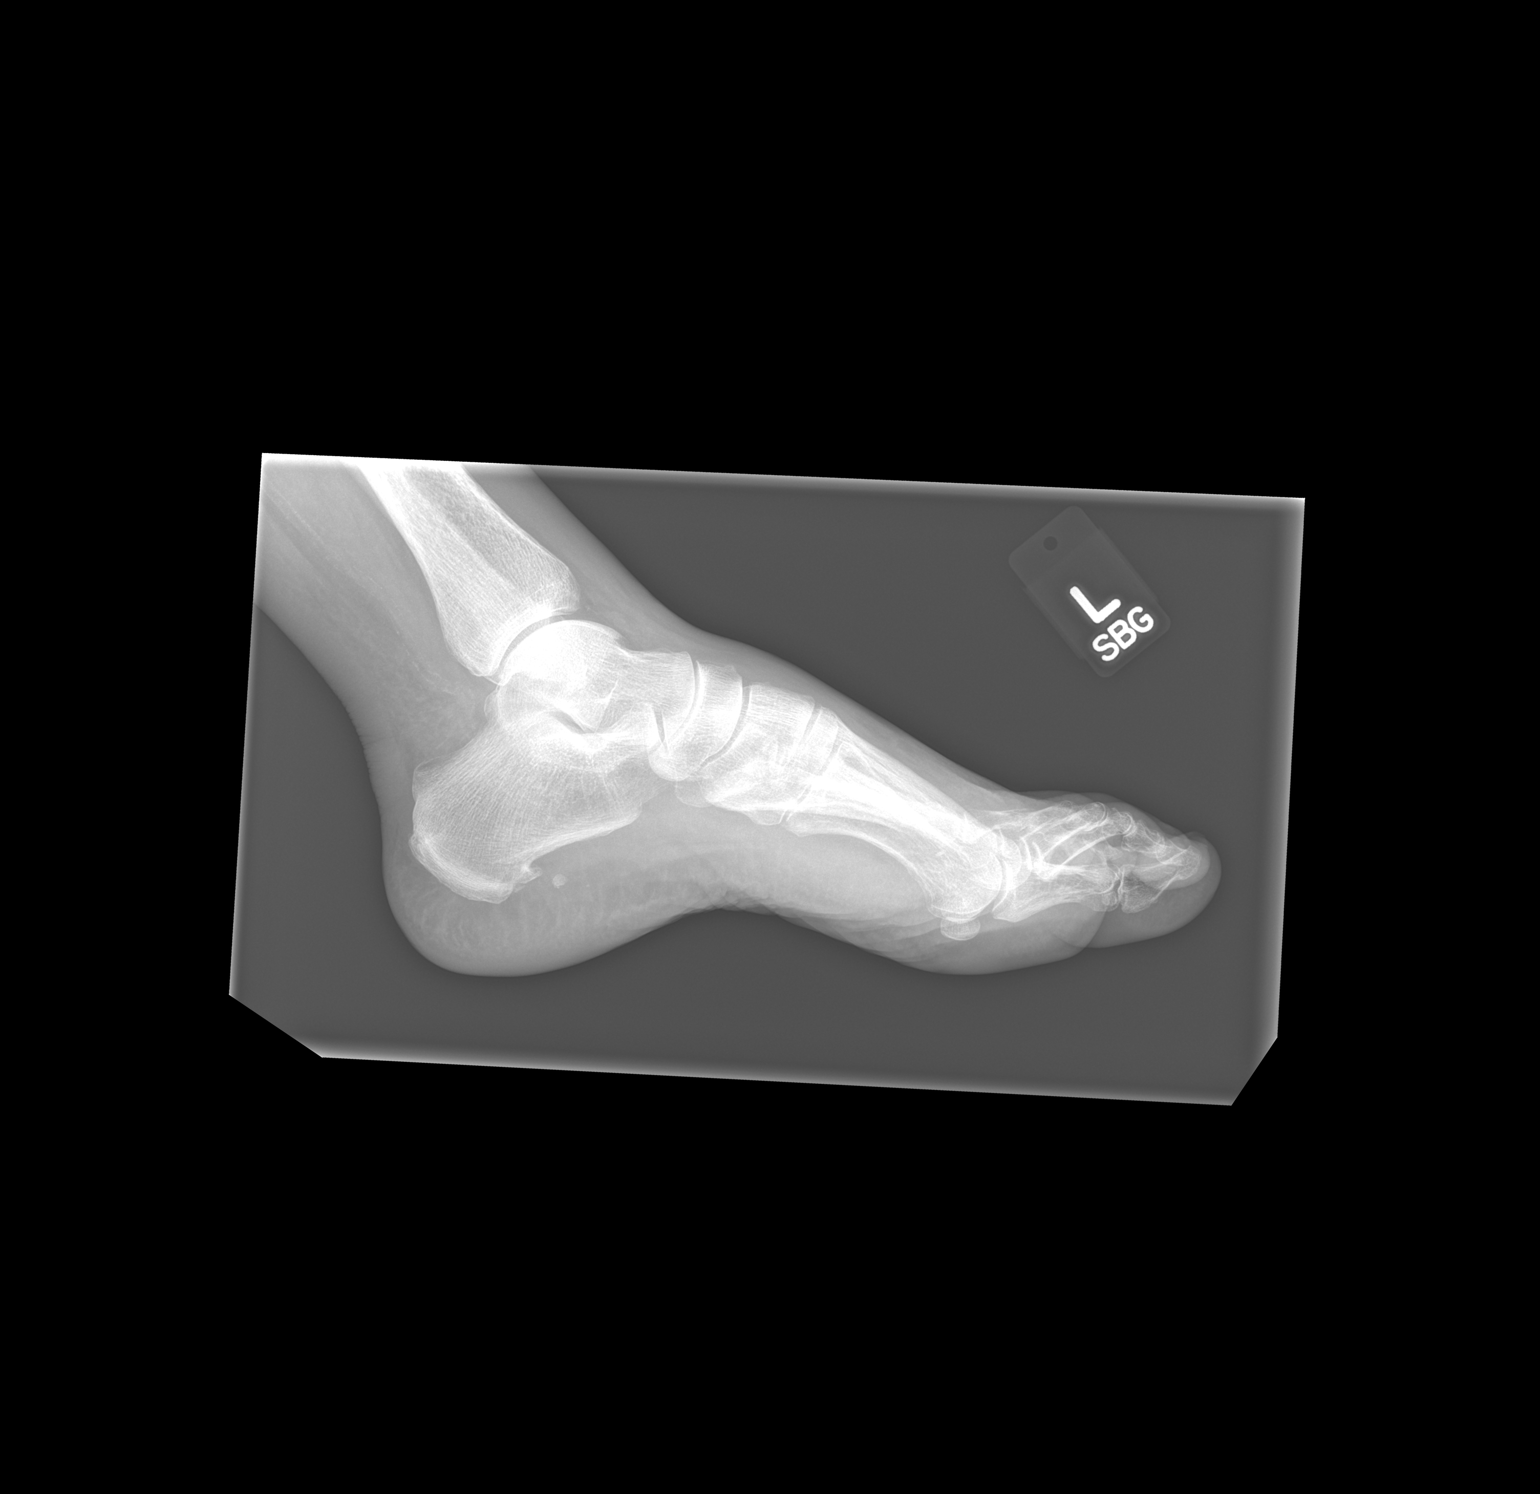

[3 of 3 positions shown; findings below may reference images not displayed]

FINDINGS: There is no evidence of fracture or dislocation. A small plantar
calcaneal bone spur is seen. No other significant bone abnormality
identified. Soft tissues are unremarkable.
IMPRESSION: No acute findings.

Small plantar calcaneal bone spur noted.

## 2016-07-31 DIAGNOSIS — E538 Deficiency of other specified B group vitamins: Secondary | ICD-10-CM | POA: Insufficient documentation

## 2016-07-31 DIAGNOSIS — E1142 Type 2 diabetes mellitus with diabetic polyneuropathy: Secondary | ICD-10-CM | POA: Insufficient documentation

## 2016-07-31 DIAGNOSIS — E559 Vitamin D deficiency, unspecified: Secondary | ICD-10-CM | POA: Insufficient documentation

## 2016-07-31 HISTORY — DX: Type 2 diabetes mellitus with diabetic polyneuropathy: E11.42

## 2016-07-31 HISTORY — DX: Deficiency of other specified B group vitamins: E53.8

## 2016-08-17 ENCOUNTER — Other Ambulatory Visit: Payer: Self-pay | Admitting: Cardiology

## 2016-08-17 NOTE — Telephone Encounter (Signed)
REFILL 

## 2016-11-21 ENCOUNTER — Observation Stay (HOSPITAL_COMMUNITY)
Admission: EM | Admit: 2016-11-21 | Discharge: 2016-11-22 | Disposition: A | Discharge status: Home / Self Care | Payer: Medicare HMO | Attending: Internal Medicine | Admitting: Internal Medicine

## 2016-11-21 ENCOUNTER — Emergency Department (HOSPITAL_COMMUNITY): Payer: Medicare HMO

## 2016-11-21 ENCOUNTER — Encounter (HOSPITAL_COMMUNITY): Payer: Self-pay

## 2016-11-21 DIAGNOSIS — N189 Chronic kidney disease, unspecified: Secondary | ICD-10-CM

## 2016-11-21 DIAGNOSIS — Z8379 Family history of other diseases of the digestive system: Secondary | ICD-10-CM | POA: Insufficient documentation

## 2016-11-21 DIAGNOSIS — M1990 Unspecified osteoarthritis, unspecified site: Secondary | ICD-10-CM | POA: Insufficient documentation

## 2016-11-21 DIAGNOSIS — K449 Diaphragmatic hernia without obstruction or gangrene: Secondary | ICD-10-CM | POA: Insufficient documentation

## 2016-11-21 DIAGNOSIS — E559 Vitamin D deficiency, unspecified: Secondary | ICD-10-CM | POA: Insufficient documentation

## 2016-11-21 DIAGNOSIS — F329 Major depressive disorder, single episode, unspecified: Secondary | ICD-10-CM | POA: Insufficient documentation

## 2016-11-21 DIAGNOSIS — K219 Gastro-esophageal reflux disease without esophagitis: Secondary | ICD-10-CM | POA: Insufficient documentation

## 2016-11-21 DIAGNOSIS — I130 Hypertensive heart and chronic kidney disease with heart failure and stage 1 through stage 4 chronic kidney disease, or unspecified chronic kidney disease: Secondary | ICD-10-CM | POA: Diagnosis not present

## 2016-11-21 DIAGNOSIS — M549 Dorsalgia, unspecified: Secondary | ICD-10-CM

## 2016-11-21 DIAGNOSIS — Z800 Family history of malignant neoplasm of digestive organs: Secondary | ICD-10-CM | POA: Insufficient documentation

## 2016-11-21 DIAGNOSIS — E1140 Type 2 diabetes mellitus with diabetic neuropathy, unspecified: Secondary | ICD-10-CM

## 2016-11-21 DIAGNOSIS — N185 Chronic kidney disease, stage 5: Secondary | ICD-10-CM | POA: Insufficient documentation

## 2016-11-21 DIAGNOSIS — K589 Irritable bowel syndrome without diarrhea: Secondary | ICD-10-CM | POA: Insufficient documentation

## 2016-11-21 DIAGNOSIS — M25511 Pain in right shoulder: Secondary | ICD-10-CM | POA: Insufficient documentation

## 2016-11-21 DIAGNOSIS — Z885 Allergy status to narcotic agent status: Secondary | ICD-10-CM | POA: Insufficient documentation

## 2016-11-21 DIAGNOSIS — E1143 Type 2 diabetes mellitus with diabetic autonomic (poly)neuropathy: Secondary | ICD-10-CM | POA: Diagnosis not present

## 2016-11-21 DIAGNOSIS — Z905 Acquired absence of kidney: Secondary | ICD-10-CM | POA: Insufficient documentation

## 2016-11-21 DIAGNOSIS — G4730 Sleep apnea, unspecified: Secondary | ICD-10-CM | POA: Insufficient documentation

## 2016-11-21 DIAGNOSIS — J45909 Unspecified asthma, uncomplicated: Secondary | ICD-10-CM | POA: Insufficient documentation

## 2016-11-21 DIAGNOSIS — I2510 Atherosclerotic heart disease of native coronary artery without angina pectoris: Secondary | ICD-10-CM | POA: Diagnosis not present

## 2016-11-21 DIAGNOSIS — F411 Generalized anxiety disorder: Secondary | ICD-10-CM | POA: Diagnosis present

## 2016-11-21 DIAGNOSIS — I429 Cardiomyopathy, unspecified: Secondary | ICD-10-CM | POA: Insufficient documentation

## 2016-11-21 DIAGNOSIS — I4891 Unspecified atrial fibrillation: Secondary | ICD-10-CM | POA: Diagnosis present

## 2016-11-21 DIAGNOSIS — M109 Gout, unspecified: Secondary | ICD-10-CM | POA: Diagnosis not present

## 2016-11-21 DIAGNOSIS — R072 Precordial pain: Secondary | ICD-10-CM

## 2016-11-21 DIAGNOSIS — I4892 Unspecified atrial flutter: Secondary | ICD-10-CM | POA: Insufficient documentation

## 2016-11-21 DIAGNOSIS — Z887 Allergy status to serum and vaccine status: Secondary | ICD-10-CM | POA: Insufficient documentation

## 2016-11-21 DIAGNOSIS — Z794 Long term (current) use of insulin: Secondary | ICD-10-CM | POA: Insufficient documentation

## 2016-11-21 DIAGNOSIS — N183 Chronic kidney disease, stage 3 unspecified: Secondary | ICD-10-CM | POA: Diagnosis present

## 2016-11-21 DIAGNOSIS — I6523 Occlusion and stenosis of bilateral carotid arteries: Secondary | ICD-10-CM | POA: Insufficient documentation

## 2016-11-21 DIAGNOSIS — M5030 Other cervical disc degeneration, unspecified cervical region: Secondary | ICD-10-CM | POA: Insufficient documentation

## 2016-11-21 DIAGNOSIS — I10 Essential (primary) hypertension: Secondary | ICD-10-CM | POA: Diagnosis present

## 2016-11-21 DIAGNOSIS — E1122 Type 2 diabetes mellitus with diabetic chronic kidney disease: Secondary | ICD-10-CM | POA: Diagnosis present

## 2016-11-21 DIAGNOSIS — L03311 Cellulitis of abdominal wall: Secondary | ICD-10-CM | POA: Insufficient documentation

## 2016-11-21 DIAGNOSIS — Z888 Allergy status to other drugs, medicaments and biological substances status: Secondary | ICD-10-CM | POA: Insufficient documentation

## 2016-11-21 DIAGNOSIS — F419 Anxiety disorder, unspecified: Secondary | ICD-10-CM | POA: Insufficient documentation

## 2016-11-21 DIAGNOSIS — R0602 Shortness of breath: Secondary | ICD-10-CM | POA: Diagnosis present

## 2016-11-21 DIAGNOSIS — Z9889 Other specified postprocedural states: Secondary | ICD-10-CM | POA: Insufficient documentation

## 2016-11-21 DIAGNOSIS — K3184 Gastroparesis: Secondary | ICD-10-CM | POA: Insufficient documentation

## 2016-11-21 DIAGNOSIS — I252 Old myocardial infarction: Secondary | ICD-10-CM | POA: Insufficient documentation

## 2016-11-21 DIAGNOSIS — I5032 Chronic diastolic (congestive) heart failure: Secondary | ICD-10-CM | POA: Diagnosis not present

## 2016-11-21 DIAGNOSIS — Z881 Allergy status to other antibiotic agents status: Secondary | ICD-10-CM | POA: Insufficient documentation

## 2016-11-21 DIAGNOSIS — E785 Hyperlipidemia, unspecified: Secondary | ICD-10-CM | POA: Insufficient documentation

## 2016-11-21 DIAGNOSIS — I481 Persistent atrial fibrillation: Secondary | ICD-10-CM | POA: Insufficient documentation

## 2016-11-21 DIAGNOSIS — Z9102 Food additives allergy status: Secondary | ICD-10-CM | POA: Insufficient documentation

## 2016-11-21 DIAGNOSIS — Z9981 Dependence on supplemental oxygen: Secondary | ICD-10-CM | POA: Insufficient documentation

## 2016-11-21 DIAGNOSIS — Z803 Family history of malignant neoplasm of breast: Secondary | ICD-10-CM | POA: Insufficient documentation

## 2016-11-21 DIAGNOSIS — E669 Obesity, unspecified: Secondary | ICD-10-CM | POA: Insufficient documentation

## 2016-11-21 DIAGNOSIS — Z880 Allergy status to penicillin: Secondary | ICD-10-CM | POA: Insufficient documentation

## 2016-11-21 DIAGNOSIS — Z85528 Personal history of other malignant neoplasm of kidney: Secondary | ICD-10-CM | POA: Diagnosis not present

## 2016-11-21 DIAGNOSIS — Z7901 Long term (current) use of anticoagulants: Secondary | ICD-10-CM | POA: Insufficient documentation

## 2016-11-21 DIAGNOSIS — I428 Other cardiomyopathies: Secondary | ICD-10-CM | POA: Insufficient documentation

## 2016-11-21 DIAGNOSIS — Z91040 Latex allergy status: Secondary | ICD-10-CM | POA: Insufficient documentation

## 2016-11-21 DIAGNOSIS — Z79899 Other long term (current) drug therapy: Secondary | ICD-10-CM | POA: Insufficient documentation

## 2016-11-21 DIAGNOSIS — Z6841 Body Mass Index (BMI) 40.0 and over, adult: Secondary | ICD-10-CM | POA: Insufficient documentation

## 2016-11-21 DIAGNOSIS — I480 Paroxysmal atrial fibrillation: Secondary | ICD-10-CM | POA: Diagnosis present

## 2016-11-21 DIAGNOSIS — E039 Hypothyroidism, unspecified: Secondary | ICD-10-CM | POA: Diagnosis not present

## 2016-11-21 DIAGNOSIS — Z8041 Family history of malignant neoplasm of ovary: Secondary | ICD-10-CM | POA: Insufficient documentation

## 2016-11-21 DIAGNOSIS — Z8249 Family history of ischemic heart disease and other diseases of the circulatory system: Secondary | ICD-10-CM | POA: Insufficient documentation

## 2016-11-21 DIAGNOSIS — Z8371 Family history of colonic polyps: Secondary | ICD-10-CM | POA: Insufficient documentation

## 2016-11-21 DIAGNOSIS — R079 Chest pain, unspecified: Secondary | ICD-10-CM | POA: Diagnosis not present

## 2016-11-21 DIAGNOSIS — Z91048 Other nonmedicinal substance allergy status: Secondary | ICD-10-CM | POA: Insufficient documentation

## 2016-11-21 DIAGNOSIS — I482 Chronic atrial fibrillation: Secondary | ICD-10-CM | POA: Diagnosis not present

## 2016-11-21 DIAGNOSIS — Z833 Family history of diabetes mellitus: Secondary | ICD-10-CM | POA: Insufficient documentation

## 2016-11-21 DIAGNOSIS — Z8051 Family history of malignant neoplasm of kidney: Secondary | ICD-10-CM | POA: Insufficient documentation

## 2016-11-21 DIAGNOSIS — Z9049 Acquired absence of other specified parts of digestive tract: Secondary | ICD-10-CM | POA: Insufficient documentation

## 2016-11-21 DIAGNOSIS — G8929 Other chronic pain: Secondary | ICD-10-CM | POA: Insufficient documentation

## 2016-11-21 DIAGNOSIS — Z91018 Allergy to other foods: Secondary | ICD-10-CM | POA: Insufficient documentation

## 2016-11-21 LAB — HEPATIC FUNCTION PANEL
ALBUMIN: 3.5 g/dL (ref 3.5–5.0)
ALT: 16 U/L (ref 14–54)
AST: 30 U/L (ref 15–41)
Alkaline Phosphatase: 80 U/L (ref 38–126)
Bilirubin, Direct: 0.1 mg/dL (ref 0.1–0.5)
Indirect Bilirubin: 0.5 mg/dL (ref 0.3–0.9)
TOTAL PROTEIN: 7.2 g/dL (ref 6.5–8.1)
Total Bilirubin: 0.6 mg/dL (ref 0.3–1.2)

## 2016-11-21 LAB — BASIC METABOLIC PANEL
Anion gap: 12 (ref 5–15)
BUN: 30 mg/dL — AB (ref 6–20)
CALCIUM: 9.7 mg/dL (ref 8.9–10.3)
CO2: 26 mmol/L (ref 22–32)
CREATININE: 2.97 mg/dL — AB (ref 0.44–1.00)
Chloride: 98 mmol/L — ABNORMAL LOW (ref 101–111)
GFR calc Af Amer: 17 mL/min — ABNORMAL LOW (ref >60)
GFR, EST NON AFRICAN AMERICAN: 15 mL/min — AB (ref >60)
Glucose, Bld: 123 mg/dL — ABNORMAL HIGH (ref 65–99)
Potassium: 3.8 mmol/L (ref 3.5–5.1)
SODIUM: 136 mmol/L (ref 135–145)

## 2016-11-21 LAB — CBC
HEMATOCRIT: 42.4 % (ref 36.0–46.0)
HEMOGLOBIN: 13.4 g/dL (ref 12.0–15.0)
MCH: 29.2 pg (ref 26.0–34.0)
MCHC: 31.6 g/dL (ref 30.0–36.0)
MCV: 92.4 fL (ref 78.0–100.0)
Platelets: 254 10*3/uL (ref 150–400)
RBC: 4.59 MIL/uL (ref 3.87–5.11)
RDW: 15.5 % (ref 11.5–15.5)
WBC: 8.9 10*3/uL (ref 4.0–10.5)

## 2016-11-21 LAB — BRAIN NATRIURETIC PEPTIDE: B NATRIURETIC PEPTIDE 5: 49.8 pg/mL (ref 0.0–100.0)

## 2016-11-21 LAB — LIPASE, BLOOD: Lipase: 39 U/L (ref 11–51)

## 2016-11-21 LAB — I-STAT TROPONIN, ED: Troponin i, poc: 0.01 ng/mL (ref 0.00–0.08)

## 2016-11-21 LAB — GLUCOSE, CAPILLARY: GLUCOSE-CAPILLARY: 138 mg/dL — AB (ref 65–99)

## 2016-11-21 MED ORDER — RIVAROXABAN 15 MG PO TABS
15.0000 mg | ORAL_TABLET | Freq: Every day | ORAL | Status: DC
  Filled 2016-11-21: qty 1

## 2016-11-21 MED ORDER — GI COCKTAIL ~~LOC~~
30.0000 mL | Freq: Once | ORAL | Status: AC
  Administered 2016-11-21: 30 mL via ORAL
  Filled 2016-11-21: qty 30

## 2016-11-21 MED ORDER — FENTANYL CITRATE (PF) 100 MCG/2ML IJ SOLN
50.0000 ug | Freq: Once | INTRAMUSCULAR | Status: AC
  Administered 2016-11-21: 50 ug via INTRAVENOUS
  Filled 2016-11-21: qty 2

## 2016-11-21 MED ORDER — ASPIRIN EC 325 MG PO TBEC
325.0000 mg | DELAYED_RELEASE_TABLET | Freq: Every day | ORAL | Status: DC
  Filled 2016-11-21: qty 1

## 2016-11-21 MED ORDER — MORPHINE SULFATE (PF) 2 MG/ML IV SOLN: 2.0000 mg | INTRAVENOUS | Status: DC | PRN

## 2016-11-21 MED ORDER — GI COCKTAIL ~~LOC~~: 30.0000 mL | Freq: Four times a day (QID) | ORAL | Status: DC | PRN

## 2016-11-21 MED ORDER — METOPROLOL TARTRATE 12.5 MG HALF TABLET
12.5000 mg | ORAL_TABLET | Freq: Two times a day (BID) | ORAL | Status: DC
  Administered 2016-11-21 – 2016-11-22 (×2): 12.5 mg via ORAL
  Filled 2016-11-21 (×2): qty 1

## 2016-11-21 MED ORDER — INFLUENZA VAC SPLIT HIGH-DOSE 0.5 ML IM SUSY
0.5000 mL | PREFILLED_SYRINGE | INTRAMUSCULAR | Status: DC
  Filled 2016-11-21: qty 0.5

## 2016-11-21 MED ORDER — RIVAROXABAN 15 MG PO TABS
15.0000 mg | ORAL_TABLET | Freq: Every day | ORAL | Status: DC
  Administered 2016-11-21: 15 mg via ORAL

## 2016-11-21 MED ORDER — GLIPIZIDE ER 10 MG PO TB24
10.0000 mg | ORAL_TABLET | Freq: Every day | ORAL | Status: DC
  Administered 2016-11-22: 10 mg via ORAL
  Filled 2016-11-21: qty 1

## 2016-11-21 MED ORDER — FEBUXOSTAT 40 MG PO TABS
20.0000 mg | ORAL_TABLET | Freq: Two times a day (BID) | ORAL | Status: DC
  Administered 2016-11-21 – 2016-11-22 (×2): 20 mg via ORAL
  Filled 2016-11-21 (×2): qty 1

## 2016-11-21 MED ORDER — ONDANSETRON HCL 4 MG/2ML IJ SOLN: 4.0000 mg | Freq: Four times a day (QID) | INTRAMUSCULAR | Status: DC | PRN

## 2016-11-21 MED ORDER — ESTRADIOL 1 MG PO TABS
1.0000 mg | ORAL_TABLET | Freq: Every day | ORAL | Status: DC
  Administered 2016-11-22: 1 mg via ORAL
  Filled 2016-11-21: qty 1

## 2016-11-21 MED ORDER — POTASSIUM CHLORIDE CRYS ER 10 MEQ PO TBCR
10.0000 meq | EXTENDED_RELEASE_TABLET | Freq: Every day | ORAL | Status: DC
  Administered 2016-11-22: 10 meq via ORAL
  Filled 2016-11-21: qty 1

## 2016-11-21 MED ORDER — LEVOTHYROXINE SODIUM 25 MCG PO TABS
137.0000 ug | ORAL_TABLET | Freq: Every day | ORAL | Status: DC
  Administered 2016-11-22: 137 ug via ORAL
  Filled 2016-11-21: qty 1

## 2016-11-21 MED ORDER — SIMVASTATIN 20 MG PO TABS
20.0000 mg | ORAL_TABLET | Freq: Every day | ORAL | Status: DC
  Administered 2016-11-21: 20 mg via ORAL
  Filled 2016-11-21: qty 1

## 2016-11-21 MED ORDER — AMIODARONE HCL 100 MG PO TABS
100.0000 mg | ORAL_TABLET | Freq: Every day | ORAL | Status: DC
  Administered 2016-11-22: 100 mg via ORAL
  Filled 2016-11-21: qty 1

## 2016-11-21 MED ORDER — ACETAMINOPHEN 325 MG PO TABS
650.0000 mg | ORAL_TABLET | ORAL | Status: DC | PRN
  Administered 2016-11-22: 650 mg via ORAL
  Filled 2016-11-21: qty 2

## 2016-11-21 MED ORDER — MAGNESIUM OXIDE 400 (241.3 MG) MG PO TABS: 200.0000 mg | ORAL_TABLET | ORAL | Status: DC

## 2016-11-21 MED ORDER — DILTIAZEM HCL ER COATED BEADS 120 MG PO CP24
120.0000 mg | ORAL_CAPSULE | Freq: Every day | ORAL | Status: DC
  Administered 2016-11-22: 120 mg via ORAL
  Filled 2016-11-21: qty 1

## 2016-11-21 MED ORDER — TORSEMIDE 20 MG PO TABS
40.0000 mg | ORAL_TABLET | Freq: Two times a day (BID) | ORAL | Status: DC
  Administered 2016-11-22: 40 mg via ORAL
  Filled 2016-11-21: qty 2

## 2016-11-21 MED ORDER — INSULIN GLARGINE 100 UNIT/ML ~~LOC~~ SOLN
25.0000 [IU] | Freq: Two times a day (BID) | SUBCUTANEOUS | Status: DC
  Administered 2016-11-21: 25 [IU] via SUBCUTANEOUS
  Filled 2016-11-21 (×2): qty 0.25

## 2016-11-21 NOTE — ED Triage Notes (Signed)
Pt from [facility] in [place] with [facility] c.o central pain that started Wednesday of this week but got worse today so she went to Urgent care. Pt has extensive cardiac hx with loop recorder currently in chest "but the battery is dead". Pt was pain free with EMS but upon arrival to ed pt c.o 9/10 pain. Hr 74 irregular BP 120/70. 22G left hand. Pt a.o, nad

## 2016-11-21 NOTE — Progress Notes (Signed)
New Admission Note:   Arrival Method: Bed/ED Mental Orientation: Alert and oriented x 4  Telemetry:3e box 27 Assessment: Completed Pain:0/10 Tubes: None Safety Measures: Safety Fall Prevention Plan has been discussed  Admission:  [facility] [place] Orientation: Patient has been orientated to the room, unit and staff.  Family: daughter at bedside  Orders to be reviewed and implemented.  Call light has been placed within reach. Patient refused bed alarm.  Family and patient educated about importance of bed alarm especially at night and because patient is on Xarelto however patient stated that she will call if help is needed.  Will continue to monitor.   Venetia Night, RN Phone: [phone]

## 2016-11-21 NOTE — ED Notes (Signed)
ED Provider at bedside. 
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
## 2016-11-21 NOTE — ED Notes (Addendum)
Lab to add on BNP, hepatic panel and lipase

## 2016-11-21 NOTE — Progress Notes (Signed)
ANTICOAGULATION CONSULT NOTE - Initial Consult  Pharmacy Consult for Xarelto Indication: atrial fibrillation  Allergies  Allergen Reactions  . Adhesive [Tape] Itching, Swelling, Rash and Other (See Comments)    Tears skin and causes blisters also. EKG pads will cause welps.   . Avelox [Moxifloxacin] Swelling and Rash  . Blueberry Flavor Anaphylaxis  . Cefprozil Shortness Of Breath and Rash  . Cetacaine [Butamben-Tetracaine-Benzocaine] Nausea And Vomiting and Swelling  . Dicyclomine Nausea And Vomiting and Other (See Comments)    "Heart trouble"; Headaches and increased blood sugars "Heart trouble"; Headaches and increased blood sugars  . Food Anaphylaxis and Other (See Comments)    Melons, Bananas, Cantaloupes, Watermelon-throat closes up and blisters   . Imdur [Isosorbide Nitrate] Hives, Palpitations, Other (See Comments) and Rash    Headaches also  . Januvia [Sitagliptin] Shortness Of Breath  . Lipitor [Atorvastatin] Shortness Of Breath  . Losartan Potassium Shortness Of Breath  . Nitroglycerin Other (See Comments)    Caused cardiac arrest and feels like skin bring torn off back of head Caused cardiac arrest and feels like skin bring torn off back of head  . Oxycodone Hives and Rash    Tolerates Dilaudid Tolerates Dilaudid  . Penicillins Anaphylaxis    Has patient had a PCN reaction causing immediate rash, facial/tongue/throat swelling, SOB or lightheadedness with hypotension: Yes Has patient had a PCN reaction causing severe rash involving mucus membranes or skin necrosis: No Has patient had a PCN reaction that required hospitalization Yes Has patient had a PCN reaction occurring within the last 10 years: No If all of the above answers are "NO", then may proceed with Cephalosporin use. Has patient had a PCN reaction causing immediate rash, facial/tongue/throat swelling, SOB or lightheadedness with hypotension: Yes Has patient had a PCN reaction causing severe rash involving  mucus membranes or skin necrosis: No Has patient had a PCN reaction that required hospitalization Yes Has patient had a PCN reaction occurring within the last 10 years: No If all of the above answers are "NO", then may proceed with Cephalosporin use.   . Prednisone Anaphylaxis  . Vancomycin Anaphylaxis  . Hydrocodone Hives    Tolerates Dilaudid  . Latex Other (See Comments) and Rash    blisters  . Tamiflu [Oseltamivir] Other (See Comments)    Contraindicated with other medications Patient on tikosyn, and tamiflu interfered with anti arrhythmic med Contraindicated with other medications Patient on tikosyn, and tamiflu interfered with anti arrhythmic med  . Zyrtec [Cetirizine] Other (See Comments)    unspecified  . Lasix [Furosemide] Hives, Swelling and Rash  . Mupirocin Rash    Patient Measurements: Height: 5\' 2"  (157.5 cm) Weight: 225 lb 4.8 oz (102.2 kg) IBW/kg (Calculated) : 50.1  Vital Signs: Temp: 98.6 F (37 C) (10/27 2142) Temp Source: Oral (10/27 2142) BP: 120/64 (10/27 2142) Pulse Rate: 62 (10/27 2142)  Labs:  Recent Labs  11/21/16 1602  HGB 13.4  HCT 42.4  PLT 254  CREATININE 2.97*    Estimated Creatinine Clearance: 18.6 mL/min (A) (by C-G formula based on SCr of 2.97 mg/dL (H)).   Medical History: Past Medical History:  Diagnosis Date  . Adenomatous colon polyp 02/13/09  . Anxiety   . Asthma   . Atrial flutter ([place])    a. By ILR interrogation.  . Bell's palsy   . Cancer of right renal pelvis ([place])    a. 01/2015 s/p robot assisted lap nephroureterectomy, lysis of adhesions.  . Chronic diastolic CHF (congestive heart failure) (  [place])    a. 12/2012 Echo: EF 45%, grade 3 DD; b. 08/2014 TEE: EF 55%.  . CKD (chronic kidney disease), stage III ([place])    a. Creat Cl 32.3 (Cockcroft-Gault using her actual weight).  . Complication of anesthesia    difficult to awaken   . Degenerative disc disease, cervical   . Depression   . Diabetes mellitus without  complication ([place])   . Diverticulitis   . Gastroparesis   . GERD (gastroesophageal reflux disease)   . Gout   . Hiatal hernia   . Hyperlipidemia   . Hypertension   . Hypothyroidism   . IBS (irritable bowel syndrome)   . Myocardial infarction ([place])    2014  . Neuropathy of both feet   . NICM (nonischemic cardiomyopathy) ([place])    a. 12/2012 Echo: EF 45% with grade 3 DD;  b. 08/2014 TEE: EF 55%, no rwma, mod RAE, mod-sev LAE, triv MR/TR, No LAA thrombus, no PFO/ASD, Grade III plaque in desc thoracic Ao.  . Non-obstructive CAD    a. 12/2012 Cath: LM nl, LAD 50p/m, LCX 50-80m (FFR 0.93), RCA min irregs, EF 55-65%-->Med Rx. b. L&RHC 03/21/2014 EF 50-55%, 50% eccentric LCx stenosis with negative FFR, 40% ostial RCA stenosis, 40-50% mid LAD stenosis   . Obesity (BMI 30-39.9)   . Osteoarthritis   . Oxygen dependent    a. patient uses 1l at rest and 2L with exertion   . PAF (paroxysmal atrial fibrillation) ([place])    a. 2015 - was on tikosyn but developed QT prolongation and torsades in setting of azithromycin-->tikosyn d/c'd, later switched to [facility] 08/2014;  b. 08/2014 s/p AF RFCA;  c. 11/2014 Amio reduced to 100mg  QD;  d. CHA2DS2VASc = 6-->chronic xarelto, reduced to 15mg  QD 02/2014 in setting of CKD/nephrectomy.  . Sleep apnea    pt scored 5 per stop bang tool per PAT visit 02/14/2015; results sent to PCP Dr Melina Copa   . Status post dilation of esophageal narrowing   . Syncope    a. 12/2012: MDT Reveal LINQ ILR placed;  b. 12/2012 Echo: EF 45-50%, Gr 3 DD, mild MR, mildly dil LA;  c. 12/2012 Carotid U/S: 1-39% bilat ICA stenosis.  . Vitamin D deficiency     Medications:  Prescriptions Prior to Admission  Medication Sig Dispense Refill Last Dose  . acetaminophen (TYLENOL) 500 MG tablet Take 1 tablet (500 mg total) by mouth every 6 (six) hours as needed (pain). (Patient taking differently: Take 250 mg by mouth every 6 (six) hours as needed for mild pain (pain). ) 30 tablet 0 11/21/2016 at Unknown time   . amiodarone (PACERONE) 200 MG tablet Take 0.5 tablets (100 mg total) by mouth daily. 45 tablet 3 11/21/2016 at Unknown time  . clonazePAM (KLONOPIN) 1 MG tablet Take 1 mg by mouth 3 (three) times daily as needed for anxiety.    11/21/2016 at Unknown time  . diltiazem (CARDIZEM CD) 120 MG 24 hr capsule TAKE ONE (1) CAPSULE EACH DAY 30 capsule 6 11/21/2016 at Unknown time  . estradiol (ESTRACE) 1 MG tablet Take 1 mg by mouth daily.   11/21/2016 at Unknown time  . febuxostat (ULORIC) 40 MG tablet Take 20 mg by mouth 2 (two) times daily.    11/21/2016 at Unknown time  . glipiZIDE (GLUCOTROL XL) 10 MG 24 hr tablet Take 10 mg by mouth daily.    11/21/2016 at Unknown time  . Insulin Glargine (LANTUS) 100 UNIT/ML Solostar Pen Inject 25-35 Units into the  skin 2 (two) times daily. Per sliding scale..If blood sugar is over 140   11/21/2016 at Unknown time  . levothyroxine (SYNTHROID, LEVOTHROID) 137 MCG tablet Take 137 mcg by mouth daily before breakfast.   11/21/2016 at Unknown time  . magnesium oxide (MAG-OX) 400 MG tablet Take 200 mg by mouth every other day.    11/21/2016 at Unknown time  . metoprolol tartrate (LOPRESSOR) 25 MG tablet Take 0.5 tablets (12.5 mg total) by mouth 2 (two) times daily. 90 tablet 3 11/21/2016 at 0700  . potassium chloride SA (K-DUR,KLOR-CON) 20 MEQ tablet Take 0.5 tablets (10 mEq total) by mouth daily. 45 tablet 3 11/21/2016 at Unknown time  . ranitidine (ZANTAC) 150 MG tablet Take 150 mg by mouth daily.   11/21/2016 at Unknown time  . Rivaroxaban (XARELTO) 15 MG TABS tablet Take 1 tablet (15 mg total) by mouth daily with supper. 30 tablet 6 11/20/2016 at 1900  . simvastatin (ZOCOR) 20 MG tablet Take 1 tablet (20 mg total) by mouth at bedtime. 90 tablet 3 11/20/2016 at Unknown time  . torsemide (DEMADEX) 20 MG tablet TAKE 2 TABLETS TWICE DAILY (Patient taking differently: take 40mg  by mouth once daily) 360 tablet 1 11/21/2016 at Unknown time  . traMADol (ULTRAM) 50 MG tablet  Take 1 tablet (50 mg total) by mouth every 6 (six) hours as needed for moderate pain. Post-operatively (Patient not taking: Reported on 11/21/2016) 30 tablet 0 Not Taking at Unknown time    Assessment: NANA VASTINE is a 76 y.o. female with medical history significant of  aflutter on xaralto PTA. Last dose 10/26. Scr ~3.0 appears to be near baseline. CBC WNL.  Goal of Therapy:  Monitor platelets by anticoagulation protocol: Yes   Plan:  Xarelto 15mg  QD Monitor renal function and for s/sx of bleeding  Montavis Schubring L Annalynn Centanni 11/21/2016,10:24 PM
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
## 2016-11-22 ENCOUNTER — Observation Stay (HOSPITAL_COMMUNITY): Payer: Medicare HMO

## 2016-11-22 DIAGNOSIS — R079 Chest pain, unspecified: Secondary | ICD-10-CM | POA: Diagnosis not present

## 2016-11-22 DIAGNOSIS — I10 Essential (primary) hypertension: Secondary | ICD-10-CM

## 2016-11-22 DIAGNOSIS — I5032 Chronic diastolic (congestive) heart failure: Secondary | ICD-10-CM | POA: Diagnosis not present

## 2016-11-22 DIAGNOSIS — N183 Chronic kidney disease, stage 3 (moderate): Secondary | ICD-10-CM

## 2016-11-22 DIAGNOSIS — I482 Chronic atrial fibrillation: Secondary | ICD-10-CM | POA: Diagnosis not present

## 2016-11-22 DIAGNOSIS — I2510 Atherosclerotic heart disease of native coronary artery without angina pectoris: Secondary | ICD-10-CM | POA: Diagnosis not present

## 2016-11-22 DIAGNOSIS — F411 Generalized anxiety disorder: Secondary | ICD-10-CM | POA: Diagnosis not present

## 2016-11-22 LAB — TROPONIN I: Troponin I: <0.03 ng/mL (ref <0.03)

## 2016-11-22 LAB — ECHOCARDIOGRAM COMPLETE
Height: 62 in
Weight: 3620.8 oz

## 2016-11-22 LAB — MRSA PCR SCREENING: MRSA by PCR: POSITIVE — AB

## 2016-11-22 MED ORDER — MUPIROCIN 2 % EX OINT: 1.0000 "application " | TOPICAL_OINTMENT | Freq: Two times a day (BID) | CUTANEOUS | Status: DC

## 2016-11-22 MED ORDER — LIDOCAINE 5 % EX PTCH: 1.0000 | MEDICATED_PATCH | CUTANEOUS | 0 refills | Status: DC

## 2016-11-22 MED ORDER — METHOCARBAMOL 500 MG PO TABS: 500.0000 mg | ORAL_TABLET | Freq: Three times a day (TID) | ORAL | 0 refills | Status: DC | PRN

## 2016-11-22 MED ORDER — TORSEMIDE 20 MG PO TABS: 40.0000 mg | ORAL_TABLET | Freq: Every day | ORAL | Status: DC

## 2016-11-22 MED ORDER — ACETAMINOPHEN 500 MG PO TABS: 250.0000 mg | ORAL_TABLET | Freq: Four times a day (QID) | ORAL | Status: AC | PRN

## 2016-11-22 MED ORDER — CHLORHEXIDINE GLUCONATE CLOTH 2 % EX PADS
6.0000 | MEDICATED_PAD | Freq: Every day | CUTANEOUS | Status: DC
  Administered 2016-11-22: 6 via TOPICAL

## 2016-11-22 NOTE — Progress Notes (Signed)
  Echocardiogram 2D Echocardiogram has been performed.  Tresa Res 11/22/2016, 12:08 PM

## 2016-11-22 NOTE — Progress Notes (Signed)
IV and tele removed discharge instructions given. Pt verbalized understanding questions answered. Waiting for family to pick up.

## 2016-11-22 NOTE — Discharge Summary (Signed)
Physician Discharge Summary  [name] DXA:[id number] DOB: 1940-06-27 DOA: 11/21/2016  PCP: Octavio Graves, DO  Admit date: 11/21/2016 Discharge date: 11/22/2016  Admitted From: home Disposition:  Home  Recommendations for Outpatient Follow-up:  1. Follow up with PCP in 1 week with repeat BMP 2. Follow-up with cardiology and nephrology as scheduled 3. 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 Her troponins have been negative. She denies current chest pain and feels that she has muscle spasm of her shoulder and right-sided chest. She was to go home today. She has a follow-up appointment with cardiology soon. She will get a 2-D echocardiogram done today and results can be followed up as an outpatient.  Discharge Diagnoses:  Principal Problem:   Chest pain Active Problems:   Anxiety state   CAD (coronary artery disease)   HTN (hypertension)   Chronic diastolic CHF (congestive heart failure) (HCC)   A-fib (HCC)   CKD (chronic kidney disease), stage III (HCC)   Chest and right shoulder pain - Most likely secondary to muscle spasm. Doubt that this is an ischemic cardiac pain. Troponins have been negative. Echo has been ordered to; once that is done patient can be discharged home today with outpatient follow-up with cardiology which is scheduled for sometime in November.  - Currently chest pain-free. - Use Lidoderm patch; Robaxin when necessary  Coronary artery disease: Stable. Continue  metoprolol. Outpatient follow-up with cardiology  Chronic diastolic CHF: Compensated. Continue torsemide, metoprolol.  HTN (hypertension)- cont home meds  Chronic   A-fib: rate controlled, cont xaralto, amiodarone and metoprolol. Outpatient follow-up with electrophysiology.  CKD (chronic kidney disease), stage III - stable at baseline; outpatient follow-up with repeat BMP in a week. Outpatient follow-up with nephrology.     Discharge Instructions  Discharge Instructions    Call MD for:  difficulty breathing, headache or visual disturbances    Complete by:  As directed    Call MD for:  extreme fatigue    Complete by:  As directed    Call MD for:  hives    Complete by:  As directed    Call MD for:  persistant dizziness or light-headedness    Complete by:  As directed    Call MD for:  persistant nausea and vomiting    Complete by:  As directed    Call MD for:  severe uncontrolled pain    Complete by:  As directed    Call MD for:  temperature >100.4    Complete by:  As directed    Diet - low sodium heart healthy    Complete by:  As directed    Diet Carb Modified    Complete by:  As directed    Increase activity slowly    Complete by:  As directed      Allergies as of 11/22/2016      Reactions   Adhesive [tape] Itching, Swelling, Rash, Other (See Comments)   Tears skin and causes blisters also. EKG pads will cause welps.    Avelox [moxifloxacin] Swelling, Rash   Blueberry Flavor Anaphylaxis  Cefprozil Shortness Of Breath, Rash   Cetacaine [butamben-tetracaine-benzocaine] Nausea And Vomiting, Swelling   Dicyclomine Nausea And Vomiting, Other (See Comments)   "Heart trouble"; Headaches and increased blood sugars "Heart trouble"; Headaches and increased blood sugars   Food Anaphylaxis, Other (See Comments)   Melons, Bananas, Cantaloupes, Watermelon-throat closes up and blisters    Imdur [isosorbide Nitrate] Hives, Palpitations, Other (See Comments), Rash   Headaches also    Januvia [sitagliptin] Shortness Of Breath   Lipitor [atorvastatin] Shortness Of Breath   Losartan Potassium Shortness Of Breath   Nitroglycerin Other (See Comments)   Caused cardiac arrest and feels like skin bring torn off back of head Caused cardiac arrest and feels like skin bring torn off back of head   Oxycodone Hives, Rash   Tolerates Dilaudid Tolerates Dilaudid   Penicillins Anaphylaxis   Has patient had a PCN reaction causing immediate rash, facial/tongue/throat swelling, SOB or lightheadedness with hypotension: Yes Has patient had a PCN reaction causing severe rash involving mucus membranes or skin necrosis: No Has patient had a PCN reaction that required hospitalization Yes Has patient had a PCN reaction occurring within the last 10 years: No If all of the above answers are "NO", then may proceed with Cephalosporin use. Has patient had a PCN reaction causing immediate rash, facial/tongue/throat swelling, SOB or lightheadedness with hypotension: Yes Has patient had a PCN reaction causing severe rash involving mucus membranes or skin necrosis: No Has patient had a PCN reaction that required hospitalization Yes Has patient had a PCN reaction occurring within the last 10 years: No If all of the above answers are "NO", then may proceed with Cephalosporin use.   Prednisone Anaphylaxis   Vancomycin Anaphylaxis   Hydrocodone Hives   Tolerates Dilaudid   Latex Other (See Comments), Rash   blisters   Tamiflu [oseltamivir] Other (See Comments)   Contraindicated with other medications Patient on tikosyn, and tamiflu interfered with anti arrhythmic med Contraindicated with other medications Patient on tikosyn, and tamiflu interfered with anti arrhythmic med   Zyrtec [cetirizine] Other (See Comments)   unspecified   Lasix [furosemide] Hives, Swelling, Rash   Mupirocin Rash      Medication List    STOP taking these medications   traMADol 50 MG tablet Commonly known as:   ULTRAM     TAKE these medications   acetaminophen 500 MG tablet Commonly known as:  TYLENOL Take 0.5 tablets (250 mg total) by mouth every 6 (six) hours as needed for mild pain (pain).   amiodarone 200 MG tablet Commonly known as:  PACERONE Take 0.5 tablets (100 mg total) by mouth daily.   clonazePAM 1 MG tablet Commonly known as:  KLONOPIN Take 1 mg by mouth 3 (three) times daily as needed for anxiety.   diltiazem 120 MG 24 hr capsule Commonly known as:  CARDIZEM CD TAKE ONE (1) CAPSULE EACH DAY   estradiol 1 MG tablet Commonly known as:  ESTRACE Take 1 mg by mouth daily.   glipiZIDE 10 MG 24 hr tablet Commonly known as:  GLUCOTROL XL Take 10 mg by mouth daily.   Insulin Glargine 100 UNIT/ML Solostar Pen Commonly known as:  LANTUS Inject 25-35 Units into the skin 2 (two) times daily. Per sliding scale..If blood sugar is over 140   levothyroxine 137 MCG tablet Commonly known as:  SYNTHROID, LEVOTHROID Take 137 mcg by mouth daily before breakfast.   lidocaine 5 % Commonly known as:  LIDODERM Place 1 patch onto the skin daily. Remove &  Discard patch within 12 hours or as directed by MD   magnesium oxide 400 MG tablet Commonly known as:  MAG-OX Take 200 mg by mouth every other day.   methocarbamol 500 MG tablet Commonly known as:  ROBAXIN Take 1 tablet (500 mg total) by mouth every 8 (eight) hours as needed for muscle spasms.   metoprolol tartrate 25 MG tablet Commonly known as:  LOPRESSOR Take 0.5 tablets (12.5 mg total) by mouth 2 (two) times daily.   potassium chloride SA 20 MEQ tablet Commonly known as:  K-DUR,KLOR-CON Take 0.5 tablets (10 mEq total) by mouth daily.   ranitidine 150 MG tablet Commonly known as:  ZANTAC Take 150 mg by mouth daily.   Rivaroxaban 15 MG Tabs tablet Commonly known as:  XARELTO Take 1 tablet (15 mg total) by mouth daily with supper.   simvastatin 20 MG tablet Commonly known as:  ZOCOR Take 1 tablet (20 mg total) by mouth  at bedtime.   torsemide 20 MG tablet Commonly known as:  DEMADEX Take 2 tablets (40 mg total) by mouth daily. What changed:  See the new instructions.   ULORIC 40 MG tablet Generic drug:  febuxostat Take 20 mg by mouth 2 (two) times daily.      Follow-up Information    Octavio Graves, DO. Schedule an appointment as soon as possible for a visit in 1 week(s).   Why:  with repeat BMP Contact information: [address] [place] [address] [phone]        [name], MD Follow up.   Specialty:  Cardiology Why:  keep scheduled appointment Contact information: [address] [place] [address] [phone]          Allergies  Allergen Reactions  . Adhesive [Tape] Itching, Swelling, Rash and Other (See Comments)    Tears skin and causes blisters also. EKG pads will cause welps.   . Avelox [Moxifloxacin] Swelling and Rash  . Blueberry Flavor Anaphylaxis  . Cefprozil Shortness Of Breath and Rash  . Cetacaine [Butamben-Tetracaine-Benzocaine] Nausea And Vomiting and Swelling  . Dicyclomine Nausea And Vomiting and Other (See Comments)    "Heart trouble"; Headaches and increased blood sugars "Heart trouble"; Headaches and increased blood sugars  . Food Anaphylaxis and Other (See Comments)    Melons, Bananas, Cantaloupes, Watermelon-throat closes up and blisters   . Imdur [Isosorbide Nitrate] Hives, Palpitations, Other (See Comments) and Rash    Headaches also  . Januvia [Sitagliptin] Shortness Of Breath  . Lipitor [Atorvastatin] Shortness Of Breath  . Losartan Potassium Shortness Of Breath  . Nitroglycerin Other (See Comments)    Caused cardiac arrest and feels like skin bring torn off back of head Caused cardiac arrest and feels like skin bring torn off back of head  . Oxycodone Hives and Rash    Tolerates Dilaudid Tolerates Dilaudid  . Penicillins Anaphylaxis    Has patient had a PCN reaction causing immediate rash, facial/tongue/throat  swelling, SOB or lightheadedness with hypotension: Yes Has patient had a PCN reaction causing severe rash involving mucus membranes or skin necrosis: No Has patient had a PCN reaction that required hospitalization Yes Has patient had a PCN reaction occurring within the last 10 years: No If all of the above answers are "NO", then may proceed with Cephalosporin use. Has patient had a PCN reaction causing immediate rash, facial/tongue/throat swelling, SOB or lightheadedness with hypotension: Yes Has patient had a PCN reaction causing severe rash involving mucus membranes or  skin necrosis: No Has patient had a PCN reaction that required hospitalization Yes Has patient had a PCN reaction occurring within the last 10 years: No If all of the above answers are "NO", then may proceed with Cephalosporin use.   . Prednisone Anaphylaxis  . Vancomycin Anaphylaxis  . Hydrocodone Hives    Tolerates Dilaudid  . Latex Other (See Comments) and Rash    blisters  . Tamiflu [Oseltamivir] Other (See Comments)    Contraindicated with other medications Patient on tikosyn, and tamiflu interfered with anti arrhythmic med Contraindicated with other medications Patient on tikosyn, and tamiflu interfered with anti arrhythmic med  . Zyrtec [Cetirizine] Other (See Comments)    unspecified  . Lasix [Furosemide] Hives, Swelling and Rash  . Mupirocin Rash    Consultations:  None   Procedures/Studies: Dg Chest 2 View  Result Date: 11/21/2016 CLINICAL DATA:  Central chest pain 3 days getting worse. EXAM: CHEST  2 VIEW COMPARISON:  None. FINDINGS: Lungs are adequately inflated without consolidation or effusion. Mild stable cardiomegaly. Loop recorder over the left chest. Calcified plaque over the aortic arch. Degenerative change of the spine. IMPRESSION: No acute cardiopulmonary disease. Stable cardiomegaly. Electronically Signed   By: Marin Olp M.D.   On: 11/21/2016 17:55    Echo  pending   Subjective: Patient seen and examined at bedside. She denies current chest pain. She states that she is having some right shoulder spasm which is radiating towards the right side of her chest. No overnight fever or vomiting.  Discharge Exam: Vitals:   11/22/16 0147 11/22/16 0534  BP: (!) 98/58 (!) 104/58  Pulse: 84 74  Resp: 18 16  Temp: 98.7 F (37.1 C) 98.2 F (36.8 C)  SpO2: 98% 98%   Vitals:   11/21/16 2100 11/21/16 2142 11/22/16 0147 11/22/16 0534  BP: 139/82 120/64 (!) 98/58 (!) 104/58  Pulse: (!) 56 62 84 74  Resp: 19 18 18 16   Temp:  98.6 F (37 C) 98.7 F (37.1 C) 98.2 F (36.8 C)  TempSrc:  Oral Oral Oral  SpO2: 95% 100% 98% 98%  Weight:  102.2 kg (225 lb 4.8 oz)  102.6 kg (226 lb 4.8 oz)  Height:  5\' 2"  (1.575 m)      General: Pt is alert, awake, not in acute distress Cardiovascular: Intermittent mild bradycardia, S1/S2 + Respiratory: Bilateral decreased breath sounds at bases Abdominal: Soft, NT, ND, bowel sounds + Extremities: Trace edema, no cyanosis    The results of significant diagnostics from this hospitalization (including imaging, microbiology, ancillary and laboratory) are listed below for reference.     Microbiology: Recent Results (from the past 240 hour(s))  MRSA PCR Screening     Status: Abnormal   Collection Time: 11/21/16 10:10 PM  Result Value Ref Range Status   MRSA by PCR POSITIVE (A) NEGATIVE Final    Comment:        The GeneXpert MRSA Assay (FDA approved for NASAL specimens only), is one component of a comprehensive MRSA colonization surveillance program. It is not intended to diagnose MRSA infection nor to guide or monitor treatment for MRSA infections. RESULT CALLED TO, READ BACK BY AND VERIFIED WITH: CVIJETIC,J RN AT 0115 11/22/16 MITCHELL,L      Labs: BNP (last 3 results)  Recent Labs  11/21/16 1602  BNP 63.3   Basic Metabolic Panel:  Recent Labs Lab 11/21/16 1602  NA 136  K 3.8  CL 98*  CO2  26  GLUCOSE 123*  BUN 30*  CREATININE 2.97*  CALCIUM 9.7   Liver Function Tests:  Recent Labs Lab 11/21/16 1725  AST 30  ALT 16  ALKPHOS 80  BILITOT 0.6  PROT 7.2  ALBUMIN 3.5    Recent Labs Lab 11/21/16 1725  LIPASE 39   No results for input(s): AMMONIA in the last 168 hours. CBC:  Recent Labs Lab 11/21/16 1602  WBC 8.9  HGB 13.4  HCT 42.4  MCV 92.4  PLT 254   Cardiac Enzymes:  Recent Labs Lab 11/22/16 0057 11/22/16 0626  TROPONINI <0.03 <0.03   BNP: Invalid input(s): POCBNP CBG:  Recent Labs Lab 11/21/16 2211  GLUCAP 138*   D-Dimer No results for input(s): DDIMER in the last 72 hours. Hgb A1c No results for input(s): HGBA1C in the last 72 hours. Lipid Profile No results for input(s): CHOL, HDL, LDLCALC, TRIG, CHOLHDL, LDLDIRECT in the last 72 hours. Thyroid function studies No results for input(s): TSH, T4TOTAL, T3FREE, THYROIDAB in the last 72 hours.  Invalid input(s): FREET3 Anemia work up No results for input(s): VITAMINB12, FOLATE, FERRITIN, TIBC, IRON, RETICCTPCT in the last 72 hours. Urinalysis    Component Value Date/Time   COLORURINE YELLOW 10/19/2015 1420   APPEARANCEUR CLEAR 10/19/2015 1420   LABSPEC 1.010 10/19/2015 1420   PHURINE 6.0 10/19/2015 1420   GLUCOSEU NEGATIVE 10/19/2015 1420   HGBUR TRACE (A) 10/19/2015 1420   BILIRUBINUR NEGATIVE 10/19/2015 1420   KETONESUR NEGATIVE 10/19/2015 1420   PROTEINUR NEGATIVE 10/19/2015 1420   UROBILINOGEN 0.2 12/04/2014 0655   NITRITE NEGATIVE 10/19/2015 1420   LEUKOCYTESUR NEGATIVE 10/19/2015 1420   Sepsis Labs Invalid input(s): PROCALCITONIN,  WBC,  LACTICIDVEN Microbiology Recent Results (from the past 240 hour(s))  MRSA PCR Screening     Status: Abnormal   Collection Time: 11/21/16 10:10 PM  Result Value Ref Range Status   MRSA by PCR POSITIVE (A) NEGATIVE Final    Comment:        The GeneXpert MRSA Assay (FDA approved for NASAL specimens only), is one component of  a comprehensive MRSA colonization surveillance program. It is not intended to diagnose MRSA infection nor to guide or monitor treatment for MRSA infections. RESULT CALLED TO, READ BACK BY AND VERIFIED WITH: CVIJETIC,J RN AT 0115 11/22/16 MITCHELL,L      Time coordinating discharge: 30 minutes  SIGNED:   Aline August, MD  [facility] 11/22/2016, 9:29 AM Pager: [phone]  If 7PM-7AM, please contact night-coverage [email or web address] Password [email or web address]

## 2016-11-22 NOTE — Progress Notes (Signed)
Patient with no complains or concerns during the night. Denies chest pain 0/10.  Stated that she feels better. Vital signs stable.  Will continue to monitor.  Samanyu Tinnell, RN

## 2016-12-31 ENCOUNTER — Ambulatory Visit (INDEPENDENT_AMBULATORY_CARE_PROVIDER_SITE_OTHER): Payer: Medicare HMO | Admitting: Physician Assistant

## 2016-12-31 VITALS — BP 129/76 | HR 69 | Ht 62.0 in | Wt 232.0 lb

## 2016-12-31 DIAGNOSIS — R079 Chest pain, unspecified: Secondary | ICD-10-CM | POA: Diagnosis not present

## 2016-12-31 DIAGNOSIS — I4819 Other persistent atrial fibrillation: Secondary | ICD-10-CM

## 2016-12-31 DIAGNOSIS — I481 Persistent atrial fibrillation: Secondary | ICD-10-CM | POA: Diagnosis not present

## 2016-12-31 DIAGNOSIS — I5022 Chronic systolic (congestive) heart failure: Secondary | ICD-10-CM | POA: Diagnosis not present

## 2016-12-31 DIAGNOSIS — I10 Essential (primary) hypertension: Secondary | ICD-10-CM | POA: Diagnosis not present

## 2016-12-31 DIAGNOSIS — I25118 Atherosclerotic heart disease of native coronary artery with other forms of angina pectoris: Secondary | ICD-10-CM | POA: Diagnosis not present

## 2016-12-31 DIAGNOSIS — N184 Chronic kidney disease, stage 4 (severe): Secondary | ICD-10-CM

## 2016-12-31 DIAGNOSIS — E119 Type 2 diabetes mellitus without complications: Secondary | ICD-10-CM
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
## 2016-12-31 NOTE — Patient Instructions (Signed)
Medication Instructions:  NO CHANGES If you need a refill on your cardiac medications before your next appointment, please call your pharmacy.  Special Instructions: CALL IF CHEST PAIN IS WORSE  Follow-Up: Your physician wants you to follow-up in: 2-3 MONTHS WITH DR [name].   Thank you for choosing [facility] at [facility]!!

## 2017-01-02 ENCOUNTER — Encounter: Payer: Self-pay | Admitting: Physician Assistant

## 2017-01-06 ENCOUNTER — Other Ambulatory Visit: Payer: Self-pay | Admitting: Cardiology

## 2017-01-06 NOTE — Telephone Encounter (Signed)
REFILL 
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
## 2017-03-08 ENCOUNTER — Encounter: Payer: Self-pay | Admitting: Cardiology

## 2017-03-08 ENCOUNTER — Ambulatory Visit (INDEPENDENT_AMBULATORY_CARE_PROVIDER_SITE_OTHER): Payer: Medicare HMO | Admitting: Cardiology

## 2017-03-08 VITALS — BP 122/76 | HR 64 | Ht 62.0 in | Wt 234.6 lb

## 2017-03-08 DIAGNOSIS — I25118 Atherosclerotic heart disease of native coronary artery with other forms of angina pectoris: Secondary | ICD-10-CM

## 2017-03-08 DIAGNOSIS — I5022 Chronic systolic (congestive) heart failure: Secondary | ICD-10-CM

## 2017-03-08 DIAGNOSIS — N184 Chronic kidney disease, stage 4 (severe): Secondary | ICD-10-CM | POA: Diagnosis not present

## 2017-03-08 DIAGNOSIS — I480 Paroxysmal atrial fibrillation: Secondary | ICD-10-CM | POA: Diagnosis not present

## 2017-03-08 NOTE — Patient Instructions (Signed)
Continue your current therapy  I will see you in 6 months.   

## 2017-03-16 ENCOUNTER — Other Ambulatory Visit: Payer: Self-pay | Admitting: Urology

## 2017-03-16 ENCOUNTER — Ambulatory Visit (HOSPITAL_COMMUNITY)
Admission: RE | Admit: 2017-03-16 | Discharge: 2017-03-16 | Disposition: A | Discharge status: Home / Self Care | Payer: Medicare HMO | Source: Ambulatory Visit | Attending: Urology | Admitting: Urology

## 2017-03-16 DIAGNOSIS — C641 Malignant neoplasm of right kidney, except renal pelvis: Secondary | ICD-10-CM | POA: Diagnosis present

## 2017-03-24 ENCOUNTER — Other Ambulatory Visit: Payer: Self-pay | Admitting: Cardiology

## 2017-03-24 ENCOUNTER — Other Ambulatory Visit (HOSPITAL_COMMUNITY): Payer: Self-pay | Admitting: Nurse Practitioner

## 2017-03-24 NOTE — Telephone Encounter (Signed)
REFILL 

## 2017-03-24 NOTE — Telephone Encounter (Signed)
Rx(s) sent to pharmacy electronically.  

## 2017-04-28 ENCOUNTER — Other Ambulatory Visit (HOSPITAL_COMMUNITY): Payer: Self-pay | Admitting: Nurse Practitioner

## 2017-04-28 DIAGNOSIS — I4819 Other persistent atrial fibrillation: Secondary | ICD-10-CM

## 2017-04-28 NOTE — Telephone Encounter (Signed)
REFILL 

## 2017-05-04 ENCOUNTER — Ambulatory Visit (HOSPITAL_COMMUNITY)
Admission: RE | Admit: 2017-05-04 | Discharge: 2017-05-04 | Disposition: A | Discharge status: Home / Self Care | Payer: Medicare HMO | Source: Ambulatory Visit | Attending: Nurse Practitioner | Admitting: Nurse Practitioner

## 2017-05-04 ENCOUNTER — Encounter (HOSPITAL_COMMUNITY): Payer: Self-pay | Admitting: Nurse Practitioner

## 2017-05-04 VITALS — BP 116/72 | HR 86 | Ht 62.0 in | Wt 234.0 lb

## 2017-05-04 DIAGNOSIS — I480 Paroxysmal atrial fibrillation: Secondary | ICD-10-CM | POA: Insufficient documentation

## 2017-05-04 DIAGNOSIS — N184 Chronic kidney disease, stage 4 (severe): Secondary | ICD-10-CM | POA: Diagnosis not present

## 2017-05-04 DIAGNOSIS — I2510 Atherosclerotic heart disease of native coronary artery without angina pectoris: Secondary | ICD-10-CM | POA: Diagnosis not present

## 2017-05-04 DIAGNOSIS — K219 Gastro-esophageal reflux disease without esophagitis: Secondary | ICD-10-CM | POA: Insufficient documentation

## 2017-05-04 DIAGNOSIS — I4892 Unspecified atrial flutter: Secondary | ICD-10-CM | POA: Insufficient documentation

## 2017-05-04 DIAGNOSIS — G4730 Sleep apnea, unspecified: Secondary | ICD-10-CM | POA: Diagnosis not present

## 2017-05-04 DIAGNOSIS — E1122 Type 2 diabetes mellitus with diabetic chronic kidney disease: Secondary | ICD-10-CM | POA: Diagnosis not present

## 2017-05-04 DIAGNOSIS — E669 Obesity, unspecified: Secondary | ICD-10-CM | POA: Diagnosis not present

## 2017-05-04 DIAGNOSIS — Z79899 Other long term (current) drug therapy: Secondary | ICD-10-CM | POA: Insufficient documentation

## 2017-05-04 DIAGNOSIS — F419 Anxiety disorder, unspecified: Secondary | ICD-10-CM | POA: Diagnosis not present

## 2017-05-04 DIAGNOSIS — I4891 Unspecified atrial fibrillation: Secondary | ICD-10-CM | POA: Diagnosis present

## 2017-05-04 DIAGNOSIS — M1990 Unspecified osteoarthritis, unspecified site: Secondary | ICD-10-CM | POA: Insufficient documentation

## 2017-05-04 DIAGNOSIS — E1143 Type 2 diabetes mellitus with diabetic autonomic (poly)neuropathy: Secondary | ICD-10-CM | POA: Insufficient documentation

## 2017-05-04 DIAGNOSIS — Z9981 Dependence on supplemental oxygen: Secondary | ICD-10-CM | POA: Insufficient documentation

## 2017-05-04 DIAGNOSIS — Z794 Long term (current) use of insulin: Secondary | ICD-10-CM | POA: Insufficient documentation

## 2017-05-04 DIAGNOSIS — E559 Vitamin D deficiency, unspecified: Secondary | ICD-10-CM | POA: Insufficient documentation

## 2017-05-04 DIAGNOSIS — K3184 Gastroparesis: Secondary | ICD-10-CM | POA: Diagnosis not present

## 2017-05-04 DIAGNOSIS — E785 Hyperlipidemia, unspecified: Secondary | ICD-10-CM | POA: Diagnosis not present

## 2017-05-04 DIAGNOSIS — Z7902 Long term (current) use of antithrombotics/antiplatelets: Secondary | ICD-10-CM | POA: Insufficient documentation

## 2017-05-04 DIAGNOSIS — F329 Major depressive disorder, single episode, unspecified: Secondary | ICD-10-CM | POA: Insufficient documentation

## 2017-05-04 DIAGNOSIS — K589 Irritable bowel syndrome without diarrhea: Secondary | ICD-10-CM | POA: Diagnosis not present

## 2017-05-04 DIAGNOSIS — E039 Hypothyroidism, unspecified: Secondary | ICD-10-CM | POA: Diagnosis not present

## 2017-05-04 DIAGNOSIS — M109 Gout, unspecified: Secondary | ICD-10-CM | POA: Insufficient documentation

## 2017-05-04 DIAGNOSIS — I130 Hypertensive heart and chronic kidney disease with heart failure and stage 1 through stage 4 chronic kidney disease, or unspecified chronic kidney disease: Secondary | ICD-10-CM | POA: Diagnosis not present

## 2017-05-04 DIAGNOSIS — Z6839 Body mass index (BMI) 39.0-39.9, adult: Secondary | ICD-10-CM | POA: Diagnosis not present

## 2017-05-04 DIAGNOSIS — J45909 Unspecified asthma, uncomplicated: Secondary | ICD-10-CM | POA: Diagnosis not present

## 2017-05-04 DIAGNOSIS — Z79890 Hormone replacement therapy (postmenopausal): Secondary | ICD-10-CM | POA: Insufficient documentation

## 2017-05-04 DIAGNOSIS — I252 Old myocardial infarction: Secondary | ICD-10-CM | POA: Insufficient documentation

## 2017-05-04 DIAGNOSIS — Z905 Acquired absence of kidney: Secondary | ICD-10-CM | POA: Diagnosis not present

## 2017-05-04 DIAGNOSIS — I5032 Chronic diastolic (congestive) heart failure: Secondary | ICD-10-CM | POA: Insufficient documentation
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
## 2017-05-24 ENCOUNTER — Other Ambulatory Visit: Payer: Self-pay | Admitting: Cardiology

## 2017-05-24 NOTE — Telephone Encounter (Signed)
REFILL 

## 2017-06-03 ENCOUNTER — Ambulatory Visit (HOSPITAL_COMMUNITY)
Admission: RE | Admit: 2017-06-03 | Discharge: 2017-06-03 | Disposition: A | Discharge status: Home / Self Care | Payer: Medicare HMO | Source: Ambulatory Visit | Attending: Nurse Practitioner | Admitting: Nurse Practitioner

## 2017-06-03 VITALS — BP 112/66 | HR 71 | Ht 62.0 in | Wt 236.0 lb

## 2017-06-03 DIAGNOSIS — Z9889 Other specified postprocedural states: Secondary | ICD-10-CM | POA: Insufficient documentation

## 2017-06-03 DIAGNOSIS — K219 Gastro-esophageal reflux disease without esophagitis: Secondary | ICD-10-CM | POA: Diagnosis not present

## 2017-06-03 DIAGNOSIS — F419 Anxiety disorder, unspecified: Secondary | ICD-10-CM | POA: Diagnosis not present

## 2017-06-03 DIAGNOSIS — I4891 Unspecified atrial fibrillation: Secondary | ICD-10-CM | POA: Diagnosis not present

## 2017-06-03 DIAGNOSIS — Z9049 Acquired absence of other specified parts of digestive tract: Secondary | ICD-10-CM | POA: Diagnosis not present

## 2017-06-03 DIAGNOSIS — E1122 Type 2 diabetes mellitus with diabetic chronic kidney disease: Secondary | ICD-10-CM | POA: Insufficient documentation

## 2017-06-03 DIAGNOSIS — Z880 Allergy status to penicillin: Secondary | ICD-10-CM | POA: Insufficient documentation

## 2017-06-03 DIAGNOSIS — Z800 Family history of malignant neoplasm of digestive organs: Secondary | ICD-10-CM | POA: Diagnosis not present

## 2017-06-03 DIAGNOSIS — I5032 Chronic diastolic (congestive) heart failure: Secondary | ICD-10-CM | POA: Insufficient documentation

## 2017-06-03 DIAGNOSIS — I130 Hypertensive heart and chronic kidney disease with heart failure and stage 1 through stage 4 chronic kidney disease, or unspecified chronic kidney disease: Secondary | ICD-10-CM | POA: Diagnosis present

## 2017-06-03 DIAGNOSIS — Z9851 Tubal ligation status: Secondary | ICD-10-CM | POA: Insufficient documentation

## 2017-06-03 DIAGNOSIS — N183 Chronic kidney disease, stage 3 (moderate): Secondary | ICD-10-CM | POA: Diagnosis not present

## 2017-06-03 DIAGNOSIS — Z79899 Other long term (current) drug therapy: Secondary | ICD-10-CM | POA: Insufficient documentation

## 2017-06-03 DIAGNOSIS — Z888 Allergy status to other drugs, medicaments and biological substances status: Secondary | ICD-10-CM | POA: Insufficient documentation

## 2017-06-03 DIAGNOSIS — Z794 Long term (current) use of insulin: Secondary | ICD-10-CM | POA: Insufficient documentation

## 2017-06-03 DIAGNOSIS — E785 Hyperlipidemia, unspecified: Secondary | ICD-10-CM | POA: Diagnosis not present

## 2017-06-03 DIAGNOSIS — I482 Chronic atrial fibrillation, unspecified: Secondary | ICD-10-CM

## 2017-06-03 DIAGNOSIS — Z885 Allergy status to narcotic agent status: Secondary | ICD-10-CM | POA: Insufficient documentation
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
## 2017-07-24 IMAGING — CT CT RENAL STONE PROTOCOL
2 of 4 series · 16 of 46 positions shown, 18 images · non-contrast
Comparison: None.

CLINICAL DATA: Gross hematuria and bilateral groin pain. History of
renal calculi.

EXAM:
CT ABDOMEN AND PELVIS WITHOUT CONTRAST
TECHNIQUE: Multidetector CT imaging of the abdomen and pelvis was performed
following the standard protocol without IV contrast.

[Series 2: stone study 5.0 i30f 1 · axial · 0.81mm/px · z∈[-501,-46]mm · 13 of 101 slices shown, 15 images]
[im 5/101  soft-tissue]
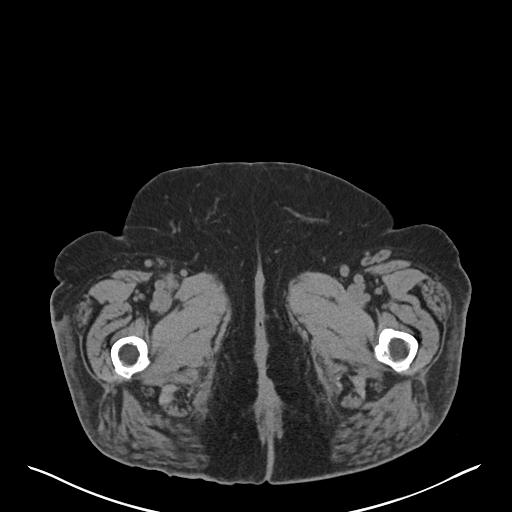
[im 5/101  bone]
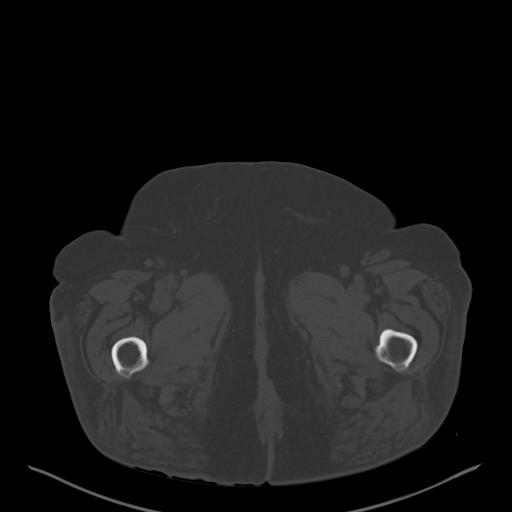
[im 13/101  soft-tissue]
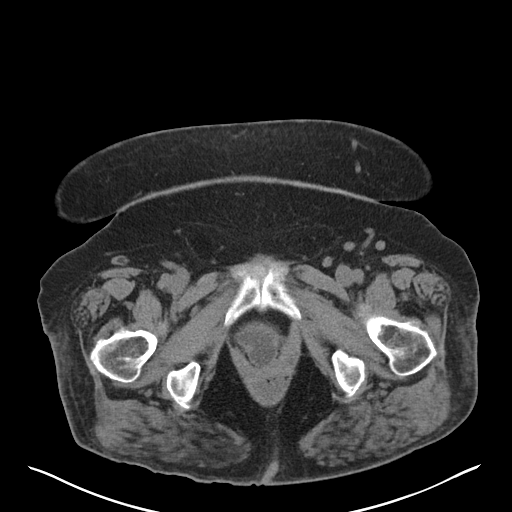
[im 21/101  soft-tissue]
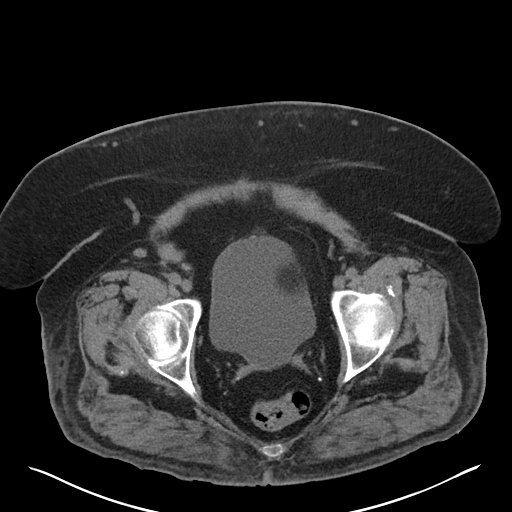
[im 30/101  soft-tissue]
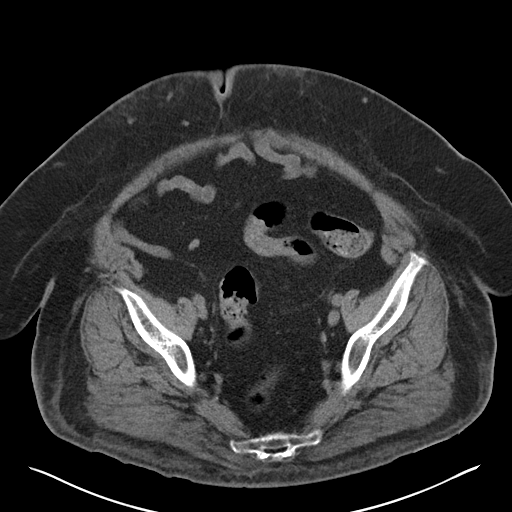
[im 34/101  soft-tissue]
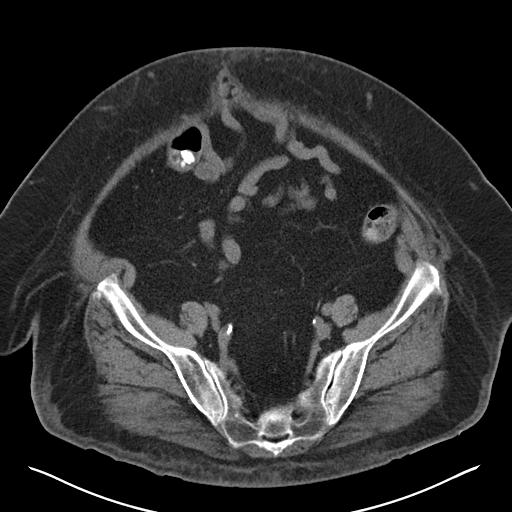
[im 42/101  soft-tissue]
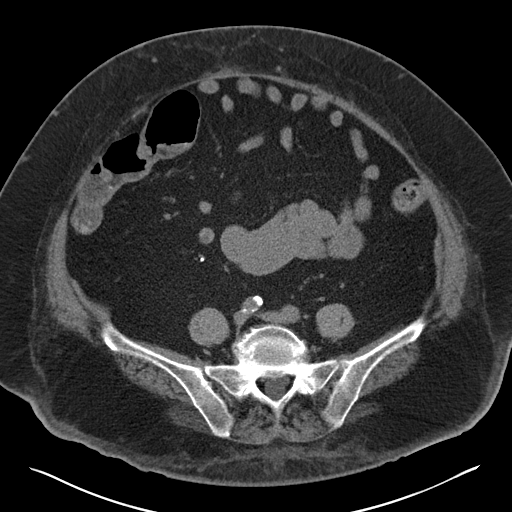
[im 51/101  soft-tissue]
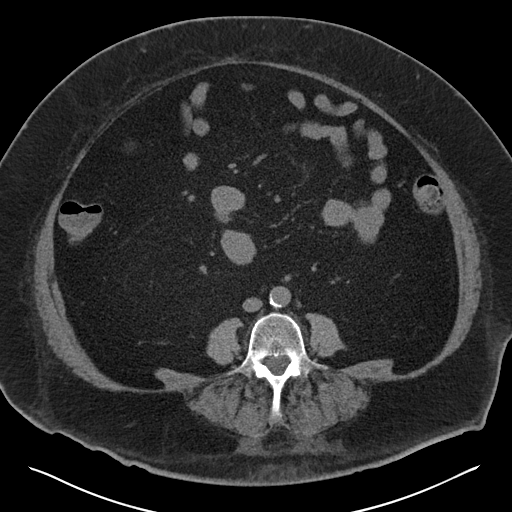
[im 59/101  soft-tissue]
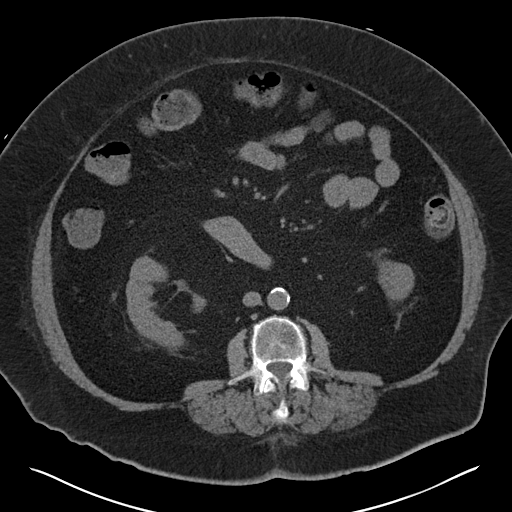
[im 67/101  soft-tissue]
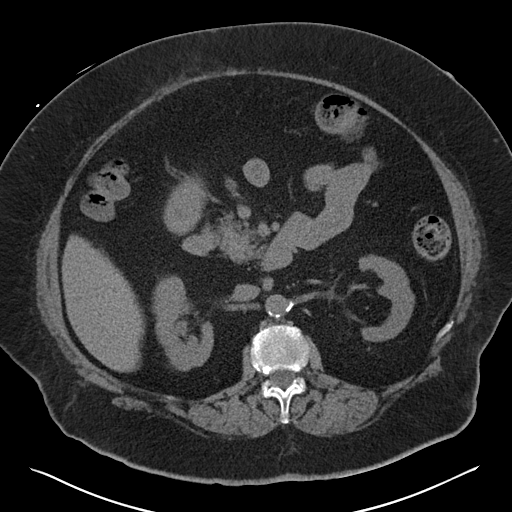
[im 67/101  bone]
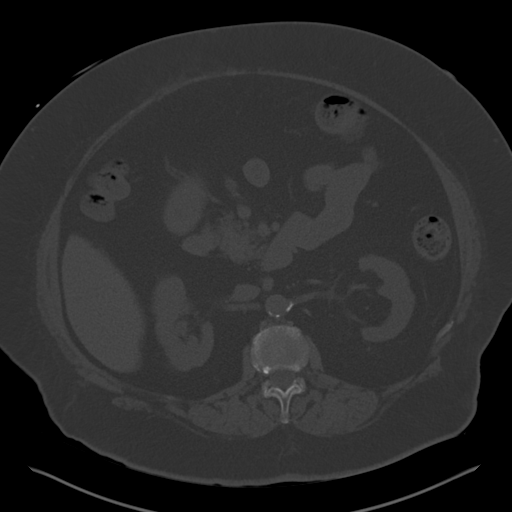
[im 71/101  soft-tissue]
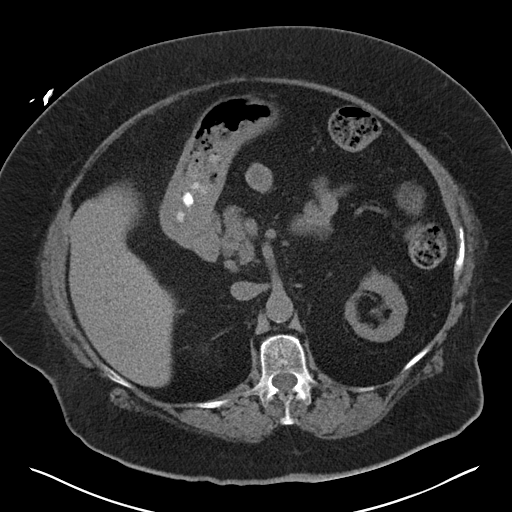
[im 80/101  soft-tissue]
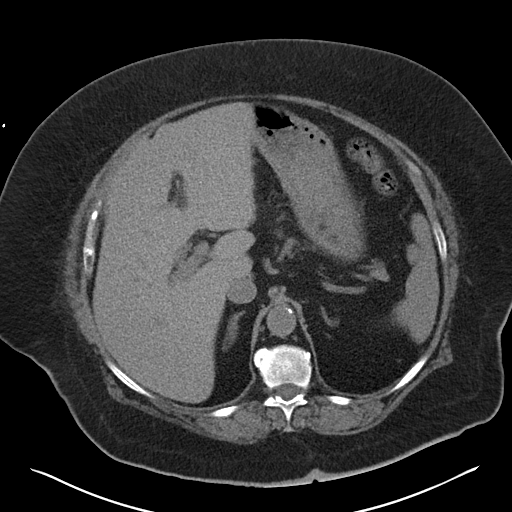
[im 88/101  soft-tissue]
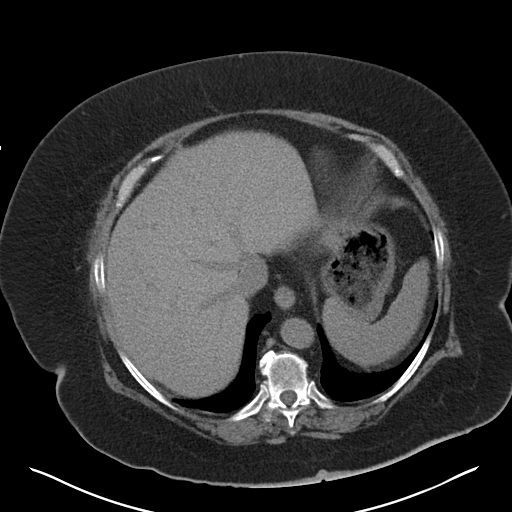
[im 96/101  soft-tissue]
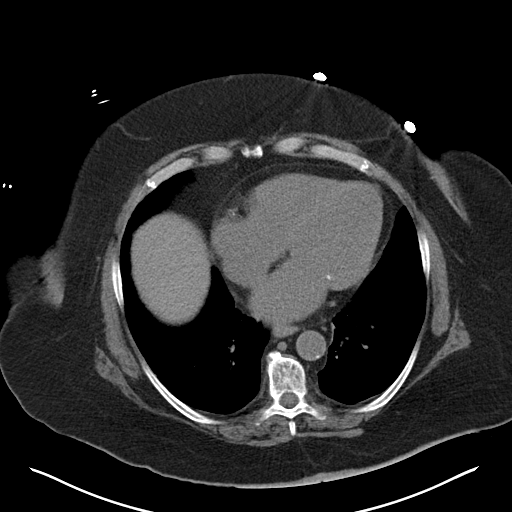

[Series 5: coronal soft tissue · coronal · 0.98mm/px · 3 of 120 slices shown]
[im 40/120  soft-tissue]
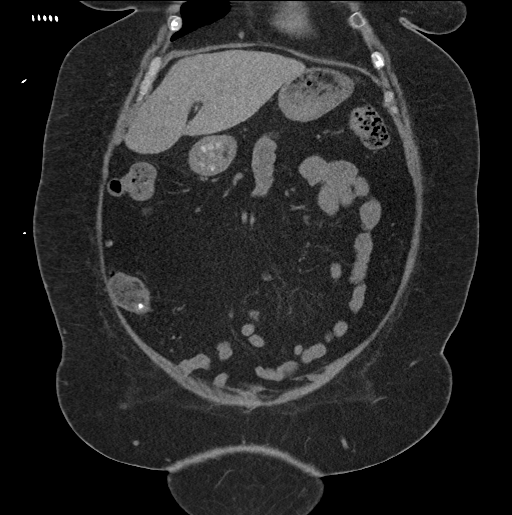
[im 53/120  soft-tissue]
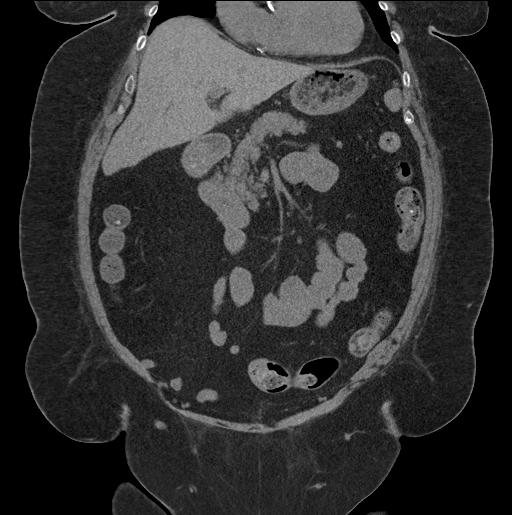
[im 67/120  soft-tissue]
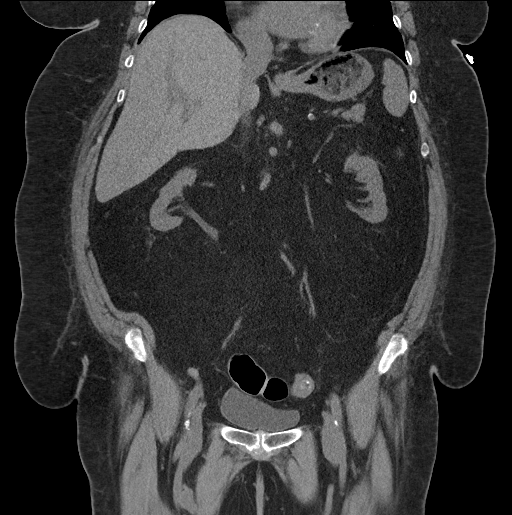

[16 of 46 positions shown; findings below may reference images not displayed]

FINDINGS: Large body habitus results in overall noisy image quality.

LUNG BASES: Included view of the lung bases are clear. The heart is
at least moderately enlarged, partially imaged. Coronary artery
calcifications. No pericardial effusion.

KIDNEYS/BLADDER: Kidneys are orthotopic, mildly atrophic
bilaterally. No nephrolithiasis, hydronephrosis; limited assessment
for renal masses on this nonenhanced examination. Too small to
characterize hypodensity lower pole of LEFT kidney. The unopacified
ureters are normal in course and caliber. Urinary bladder is well
distended, small cystocele.

SOLID ORGANS: The liver, spleen, pancreas and adrenal glands are
unremarkable for this non-contrast examination. Status post
cholecystectomy.

GASTROINTESTINAL TRACT: The stomach, small and large bowel are
normal in course and caliber without inflammatory changes, the
sensitivity may be decreased by lack of enteric contrast. The
appendix is not discretely identified, however there are no
inflammatory changes in the right lower quadrant.

PERITONEUM/RETROPERITONEUM: Aortoiliac vessels are normal in course
and caliber, moderate calcific atherosclerosis. No lymphadenopathy
by CT size criteria. Status post hysterectomy. No intraperitoneal
free fluid nor free air. Granuloma RIGHT abdomen.

SOFT TISSUES/ OSSEOUS STRUCTURES: Nonsuspicious. Small wide necked
fat containing umbilical hernia containing a knuckle of small bowel
without inflammatory changes. Moderate RIGHT, small LEFT fat
containing inguinal hernia. Prominent inguinal lymph nodes are
likely reactive, with reniform morphology. Osteopenia. Moderate to
severe lower lumbar facet arthropathy.
IMPRESSION: Symmetric mild renal atrophy without urolithiasis or obstructive
uropathy.

No acute intra-abdominal or pelvic process.

Small umbilical hernia containing a loop of small bowel without CT
findings of incarceration and strangulation.

## 2017-08-11 ENCOUNTER — Other Ambulatory Visit: Payer: Self-pay | Admitting: Cardiology

## 2017-10-11 ENCOUNTER — Ambulatory Visit (HOSPITAL_COMMUNITY)
Admission: RE | Admit: 2017-10-11 | Discharge: 2017-10-11 | Disposition: A | Discharge status: Home / Self Care | Payer: Medicare HMO | Source: Ambulatory Visit | Attending: Nurse Practitioner | Admitting: Nurse Practitioner

## 2017-10-11 ENCOUNTER — Encounter (HOSPITAL_COMMUNITY): Payer: Self-pay | Admitting: Nurse Practitioner

## 2017-10-11 VITALS — BP 118/64 | HR 51 | Ht 62.0 in | Wt 235.8 lb

## 2017-10-11 DIAGNOSIS — Z8249 Family history of ischemic heart disease and other diseases of the circulatory system: Secondary | ICD-10-CM | POA: Insufficient documentation

## 2017-10-11 DIAGNOSIS — Z888 Allergy status to other drugs, medicaments and biological substances status: Secondary | ICD-10-CM | POA: Insufficient documentation

## 2017-10-11 DIAGNOSIS — E1122 Type 2 diabetes mellitus with diabetic chronic kidney disease: Secondary | ICD-10-CM | POA: Insufficient documentation

## 2017-10-11 DIAGNOSIS — Z794 Long term (current) use of insulin: Secondary | ICD-10-CM | POA: Insufficient documentation

## 2017-10-11 DIAGNOSIS — Z8041 Family history of malignant neoplasm of ovary: Secondary | ICD-10-CM | POA: Insufficient documentation

## 2017-10-11 DIAGNOSIS — Z880 Allergy status to penicillin: Secondary | ICD-10-CM | POA: Insufficient documentation

## 2017-10-11 DIAGNOSIS — F419 Anxiety disorder, unspecified: Secondary | ICD-10-CM | POA: Diagnosis not present

## 2017-10-11 DIAGNOSIS — Z881 Allergy status to other antibiotic agents status: Secondary | ICD-10-CM | POA: Insufficient documentation

## 2017-10-11 DIAGNOSIS — Z800 Family history of malignant neoplasm of digestive organs: Secondary | ICD-10-CM | POA: Diagnosis not present

## 2017-10-11 DIAGNOSIS — F329 Major depressive disorder, single episode, unspecified: Secondary | ICD-10-CM | POA: Diagnosis not present

## 2017-10-11 DIAGNOSIS — Z79890 Hormone replacement therapy (postmenopausal): Secondary | ICD-10-CM | POA: Diagnosis not present

## 2017-10-11 DIAGNOSIS — E785 Hyperlipidemia, unspecified: Secondary | ICD-10-CM | POA: Diagnosis not present

## 2017-10-11 DIAGNOSIS — I4891 Unspecified atrial fibrillation: Secondary | ICD-10-CM | POA: Diagnosis present

## 2017-10-11 DIAGNOSIS — Z86010 Personal history of colonic polyps: Secondary | ICD-10-CM | POA: Insufficient documentation

## 2017-10-11 DIAGNOSIS — Z8051 Family history of malignant neoplasm of kidney: Secondary | ICD-10-CM | POA: Insufficient documentation

## 2017-10-11 DIAGNOSIS — Z79899 Other long term (current) drug therapy: Secondary | ICD-10-CM | POA: Diagnosis not present

## 2017-10-11 DIAGNOSIS — I130 Hypertensive heart and chronic kidney disease with heart failure and stage 1 through stage 4 chronic kidney disease, or unspecified chronic kidney disease: Secondary | ICD-10-CM | POA: Diagnosis not present

## 2017-10-11 DIAGNOSIS — Z803 Family history of malignant neoplasm of breast: Secondary | ICD-10-CM | POA: Insufficient documentation

## 2017-10-11 DIAGNOSIS — K3184 Gastroparesis: Secondary | ICD-10-CM | POA: Diagnosis not present

## 2017-10-11 DIAGNOSIS — N184 Chronic kidney disease, stage 4 (severe): Secondary | ICD-10-CM | POA: Insufficient documentation

## 2017-10-11 DIAGNOSIS — I2510 Atherosclerotic heart disease of native coronary artery without angina pectoris: Secondary | ICD-10-CM | POA: Insufficient documentation

## 2017-10-11 DIAGNOSIS — I5032 Chronic diastolic (congestive) heart failure: Secondary | ICD-10-CM | POA: Diagnosis not present

## 2017-10-11 DIAGNOSIS — J45909 Unspecified asthma, uncomplicated: Secondary | ICD-10-CM | POA: Insufficient documentation

## 2017-10-11 DIAGNOSIS — E1143 Type 2 diabetes mellitus with diabetic autonomic (poly)neuropathy: Secondary | ICD-10-CM | POA: Insufficient documentation

## 2017-10-11 DIAGNOSIS — E039 Hypothyroidism, unspecified: Secondary | ICD-10-CM | POA: Insufficient documentation

## 2017-10-11 DIAGNOSIS — K589 Irritable bowel syndrome without diarrhea: Secondary | ICD-10-CM | POA: Insufficient documentation

## 2017-10-11 DIAGNOSIS — Z7901 Long term (current) use of anticoagulants: Secondary | ICD-10-CM | POA: Diagnosis not present

## 2017-10-11 DIAGNOSIS — G4730 Sleep apnea, unspecified: Secondary | ICD-10-CM | POA: Insufficient documentation

## 2017-10-11 DIAGNOSIS — Z885 Allergy status to narcotic agent status: Secondary | ICD-10-CM | POA: Insufficient documentation

## 2017-10-11 DIAGNOSIS — Z833 Family history of diabetes mellitus: Secondary | ICD-10-CM | POA: Insufficient documentation

## 2017-10-11 DIAGNOSIS — E559 Vitamin D deficiency, unspecified: Secondary | ICD-10-CM | POA: Insufficient documentation

## 2017-10-11 DIAGNOSIS — K219 Gastro-esophageal reflux disease without esophagitis: Secondary | ICD-10-CM | POA: Diagnosis not present

## 2017-10-11 DIAGNOSIS — I252 Old myocardial infarction: Secondary | ICD-10-CM | POA: Insufficient documentation

## 2017-10-11 DIAGNOSIS — M109 Gout, unspecified: Secondary | ICD-10-CM | POA: Diagnosis not present

## 2017-10-11 DIAGNOSIS — Z91040 Latex allergy status: Secondary | ICD-10-CM | POA: Insufficient documentation

## 2017-10-11 DIAGNOSIS — Z9981 Dependence on supplemental oxygen: Secondary | ICD-10-CM | POA: Insufficient documentation

## 2017-10-11 DIAGNOSIS — Z8371 Family history of colonic polyps: Secondary | ICD-10-CM | POA: Insufficient documentation

## 2017-10-11 DIAGNOSIS — E669 Obesity, unspecified: Secondary | ICD-10-CM | POA: Insufficient documentation

## 2017-10-11 DIAGNOSIS — I480 Paroxysmal atrial fibrillation: Secondary | ICD-10-CM | POA: Diagnosis not present

## 2017-10-11 DIAGNOSIS — Z8379 Family history of other diseases of the digestive system: Secondary | ICD-10-CM | POA: Insufficient documentation

## 2017-10-11 DIAGNOSIS — Z905 Acquired absence of kidney: Secondary | ICD-10-CM | POA: Insufficient documentation

## 2017-10-11 DIAGNOSIS — Z6841 Body Mass Index (BMI) 40.0 and over, adult: Secondary | ICD-10-CM | POA: Insufficient documentation

## 2017-10-11 LAB — HEPATIC FUNCTION PANEL
ALBUMIN: 3.4 g/dL — AB (ref 3.5–5.0)
ALT: 15 U/L (ref 0–44)
AST: 30 U/L (ref 15–41)
Alkaline Phosphatase: 54 U/L (ref 38–126)
BILIRUBIN INDIRECT: 0.4 mg/dL (ref 0.3–0.9)
BILIRUBIN TOTAL: 0.6 mg/dL (ref 0.3–1.2)
Bilirubin, Direct: 0.2 mg/dL (ref 0.0–0.2)
Total Protein: 6.4 g/dL — ABNORMAL LOW (ref 6.5–8.1)

## 2017-10-11 LAB — TSH: TSH: 1.16 u[IU]/mL (ref 0.350–4.500)
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
## 2017-10-13 ENCOUNTER — Other Ambulatory Visit: Payer: Self-pay | Admitting: Cardiology

## 2017-12-07 ENCOUNTER — Ambulatory Visit (HOSPITAL_COMMUNITY): Payer: Medicare HMO | Admitting: Nurse Practitioner

## 2017-12-09 ENCOUNTER — Encounter: Payer: Self-pay | Admitting: Cardiology
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
## 2017-12-29 ENCOUNTER — Ambulatory Visit: Payer: Medicare HMO | Admitting: Cardiology

## 2018-01-05 ENCOUNTER — Other Ambulatory Visit: Payer: Self-pay | Admitting: Cardiology

## 2018-01-10 ENCOUNTER — Other Ambulatory Visit: Payer: Self-pay | Admitting: Cardiology

## 2018-02-17 ENCOUNTER — Other Ambulatory Visit (HOSPITAL_COMMUNITY): Payer: Self-pay | Admitting: Nurse Practitioner

## 2018-02-17 ENCOUNTER — Other Ambulatory Visit (HOSPITAL_COMMUNITY): Payer: Self-pay | Admitting: *Deleted

## 2018-02-17 MED ORDER — SIMVASTATIN 20 MG PO TABS: 10.0000 mg | ORAL_TABLET | Freq: Every day | ORAL | 3 refills | Status: DC

## 2018-02-17 NOTE — Telephone Encounter (Signed)
Pt flagged for drug-drug dose interaction with simvastatin and diltiazem.  Pt notified to decrease her simvastatin to 10 mg in the evening.  Pt understood and has a pill cutter to split her tablets.
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
## 2018-02-28 ENCOUNTER — Encounter: Payer: Self-pay | Admitting: Cardiology

## 2018-02-28 ENCOUNTER — Ambulatory Visit (INDEPENDENT_AMBULATORY_CARE_PROVIDER_SITE_OTHER): Payer: Medicare HMO | Admitting: Cardiology

## 2018-02-28 VITALS — BP 116/50 | Ht 62.0 in | Wt 242.0 lb

## 2018-02-28 DIAGNOSIS — I25118 Atherosclerotic heart disease of native coronary artery with other forms of angina pectoris: Secondary | ICD-10-CM

## 2018-02-28 DIAGNOSIS — N184 Chronic kidney disease, stage 4 (severe): Secondary | ICD-10-CM | POA: Diagnosis not present

## 2018-02-28 DIAGNOSIS — I5022 Chronic systolic (congestive) heart failure: Secondary | ICD-10-CM | POA: Diagnosis not present

## 2018-02-28 DIAGNOSIS — I480 Paroxysmal atrial fibrillation: Secondary | ICD-10-CM

## 2018-03-03 ENCOUNTER — Other Ambulatory Visit: Payer: Self-pay | Admitting: Cardiology

## 2018-04-14 ENCOUNTER — Ambulatory Visit (HOSPITAL_COMMUNITY): Payer: Medicare HMO | Admitting: Nurse Practitioner

## 2018-05-17 ENCOUNTER — Ambulatory Visit (HOSPITAL_COMMUNITY)
Admission: RE | Admit: 2018-05-17 | Discharge: 2018-05-17 | Disposition: A | Discharge status: Home / Self Care | Payer: Medicare HMO | Source: Ambulatory Visit | Attending: Nurse Practitioner | Admitting: Nurse Practitioner

## 2018-05-17 ENCOUNTER — Encounter (HOSPITAL_COMMUNITY): Payer: Self-pay | Admitting: Nurse Practitioner

## 2018-05-17 ENCOUNTER — Other Ambulatory Visit: Payer: Self-pay

## 2018-05-17 VITALS — BP 117/70 | HR 48 | Ht 62.0 in | Wt 241.0 lb

## 2018-05-17 DIAGNOSIS — I480 Paroxysmal atrial fibrillation: Secondary | ICD-10-CM | POA: Diagnosis not present
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
## 2018-06-23 ENCOUNTER — Encounter: Payer: Self-pay | Admitting: Cardiology

## 2018-06-28 ENCOUNTER — Telehealth (HOSPITAL_COMMUNITY): Payer: Self-pay | Admitting: *Deleted

## 2018-06-28 MED ORDER — APIXABAN 5 MG PO TABS: 5.0000 mg | ORAL_TABLET | Freq: Two times a day (BID) | ORAL | 6 refills | Status: DC

## 2018-06-28 NOTE — Telephone Encounter (Signed)
Discussed with Roderic Palau NP and Eino Farber Pharm-D - pt will be starting dialysis soon therefore it Is recommended that patient stop xarelto and start Eliquis 5mg  twice a day. Talked with patient's daughter, [name] who verbalized understanding of medication changes.

## 2018-06-28 NOTE — Telephone Encounter (Signed)
-----   Message from Rexene Agent, MD sent at 06/27/2018  1:19 PM EDT ----- Regarding: anticoagulation Good day I'm completing a virtual visit with pt, and noting her GFR has declined to 11.  I wanted to involve you to see if change in her anticoagulation is warranted, either apxiabain or warfarin? She also is being referred to [facility] for eval for AVF or AVG. Pearson Grippe

## 2018-07-11 ENCOUNTER — Other Ambulatory Visit: Payer: Self-pay

## 2018-07-11 DIAGNOSIS — N183 Chronic kidney disease, stage 3 unspecified: Secondary | ICD-10-CM

## 2018-07-14 ENCOUNTER — Telehealth (HOSPITAL_COMMUNITY): Payer: Self-pay

## 2018-07-14 NOTE — Telephone Encounter (Signed)
The above patient or their representative was contacted and gave the following answers to these questions:         Do you have any of the following symptoms? No  Fever                    Cough                   Shortness of breath  Do  you have any of the following other symptoms?    muscle pain         vomiting,        diarrhea        rash         weakness        red eye        abdominal pain         bruising          bruising or bleeding              joint pain           severe headache    Have you been in contact with someone who was or has been sick in the past 2 weeks? No  Yes                 Unsure                         Unable to assess   Does the person that you were in contact with have any of the following symptoms?   Cough         shortness of breath           muscle pain         vomiting,            diarrhea            rash            weakness           fever            red eye           abdominal pain           bruising  or  bleeding                joint pain                severe headache               Have you  or someone you have been in contact with traveled internationally in th last month?  No        If yes, which countries?   Have you  or someone you have been in contact with traveled outside [place] in th last month? No         If yes, which state and city?   COMMENTS OR ACTION PLAN FOR THIS PATIENT:          

## 2018-07-15 ENCOUNTER — Ambulatory Visit (INDEPENDENT_AMBULATORY_CARE_PROVIDER_SITE_OTHER)
Admission: RE | Admit: 2018-07-15 | Discharge: 2018-07-15 | Disposition: A | Discharge status: Home / Self Care | Payer: Medicare HMO | Source: Ambulatory Visit | Attending: Vascular Surgery | Admitting: Vascular Surgery

## 2018-07-15 ENCOUNTER — Ambulatory Visit (INDEPENDENT_AMBULATORY_CARE_PROVIDER_SITE_OTHER): Payer: Medicare HMO | Admitting: Vascular Surgery

## 2018-07-15 ENCOUNTER — Ambulatory Visit (HOSPITAL_COMMUNITY)
Admission: RE | Admit: 2018-07-15 | Discharge: 2018-07-15 | Disposition: A | Discharge status: Home / Self Care | Payer: Medicare HMO | Source: Ambulatory Visit | Attending: Vascular Surgery | Admitting: Vascular Surgery

## 2018-07-15 ENCOUNTER — Other Ambulatory Visit: Payer: Self-pay

## 2018-07-15 ENCOUNTER — Encounter: Payer: Self-pay | Admitting: Vascular Surgery

## 2018-07-15 ENCOUNTER — Encounter: Payer: Self-pay | Admitting: *Deleted

## 2018-07-15 VITALS — BP 99/58 | HR 64 | Temp 97.2°F | Resp 20 | Ht 62.0 in | Wt 244.3 lb

## 2018-07-15 DIAGNOSIS — N183 Chronic kidney disease, stage 3 unspecified: Secondary | ICD-10-CM
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
## 2018-07-19 ENCOUNTER — Other Ambulatory Visit: Payer: Self-pay | Admitting: *Deleted

## 2018-07-22 ENCOUNTER — Other Ambulatory Visit: Payer: Self-pay | Admitting: Cardiology

## 2018-07-29 ENCOUNTER — Other Ambulatory Visit (HOSPITAL_COMMUNITY)
Admission: RE | Admit: 2018-07-29 | Discharge: 2018-07-29 | Disposition: A | Discharge status: Home / Self Care | Payer: Medicare HMO | Source: Ambulatory Visit | Attending: Vascular Surgery | Admitting: Vascular Surgery

## 2018-07-29 DIAGNOSIS — Z1159 Encounter for screening for other viral diseases: Secondary | ICD-10-CM | POA: Insufficient documentation

## 2018-07-29 DIAGNOSIS — Z01812 Encounter for preprocedural laboratory examination: Secondary | ICD-10-CM | POA: Diagnosis present

## 2018-07-29 LAB — SARS CORONAVIRUS 2 (TAT 6-24 HRS): SARS Coronavirus 2: NEGATIVE

## 2018-08-01 ENCOUNTER — Encounter (HOSPITAL_COMMUNITY): Payer: Self-pay | Admitting: *Deleted

## 2018-08-01 ENCOUNTER — Other Ambulatory Visit: Payer: Self-pay

## 2018-08-01 NOTE — Progress Notes (Addendum)
Ms Longbottom denies chest pain or shortness of breath. Patient denies that she or her family has experienced any of the following: Cough Fever >100.4 Runny Nose Sore Throat Difficulty breathing/ shortness of breath Travel in past 14 days- none. Patient was tested for COVID 07/29/2018 and has been in quarantine with core family. I called [name], patient's daughter as instructed by [facility] office. [name] said that I need to speak with Ms Large, I told [name] that I was instructed to call her because of patient's cognative problems . [name] said that patient  forgets at times, but she can tell you her history and she helps her. I went over medications patient should take in am. I called Ms Scaduto, she was alert and oriented.   I instructed Ms Klahr to take 1/2 of Lantus Insulin tonight. In am I told MS Dever to not take Glipizide. I instructed patient to check CBG after awaking and every 2 hours until arrival  to the hospital.  I Instructed patient if CBG is less than 70 to drink /2 cup of a clear juice. Recheck CBG in 15 minutes then call pre- op desk at [phone] for further instructions.  Patient verbalized understanding.

## 2018-08-02 ENCOUNTER — Ambulatory Visit (HOSPITAL_COMMUNITY): Payer: Medicare HMO | Admitting: Certified Registered"

## 2018-08-02 ENCOUNTER — Ambulatory Visit (HOSPITAL_COMMUNITY)
Admission: RE | Admit: 2018-08-02 | Discharge: 2018-08-02 | Disposition: A | Discharge status: Home / Self Care | Payer: Medicare HMO | Attending: Vascular Surgery | Admitting: Vascular Surgery

## 2018-08-02 ENCOUNTER — Encounter (HOSPITAL_COMMUNITY): Payer: Self-pay | Admitting: Anesthesiology

## 2018-08-02 ENCOUNTER — Encounter (HOSPITAL_COMMUNITY)
Admission: RE | Disposition: A | Discharge status: Home / Self Care | Payer: Self-pay | Source: Home / Self Care | Attending: Vascular Surgery

## 2018-08-02 DIAGNOSIS — F419 Anxiety disorder, unspecified: Secondary | ICD-10-CM | POA: Diagnosis not present

## 2018-08-02 DIAGNOSIS — I058 Other rheumatic mitral valve diseases: Secondary | ICD-10-CM | POA: Insufficient documentation

## 2018-08-02 DIAGNOSIS — J45909 Unspecified asthma, uncomplicated: Secondary | ICD-10-CM | POA: Insufficient documentation

## 2018-08-02 DIAGNOSIS — K449 Diaphragmatic hernia without obstruction or gangrene: Secondary | ICD-10-CM | POA: Diagnosis not present

## 2018-08-02 DIAGNOSIS — I25119 Atherosclerotic heart disease of native coronary artery with unspecified angina pectoris: Secondary | ICD-10-CM | POA: Insufficient documentation

## 2018-08-02 DIAGNOSIS — I509 Heart failure, unspecified: Secondary | ICD-10-CM | POA: Diagnosis not present

## 2018-08-02 DIAGNOSIS — N186 End stage renal disease: Secondary | ICD-10-CM | POA: Diagnosis not present

## 2018-08-02 DIAGNOSIS — I132 Hypertensive heart and chronic kidney disease with heart failure and with stage 5 chronic kidney disease, or end stage renal disease: Secondary | ICD-10-CM | POA: Diagnosis not present

## 2018-08-02 DIAGNOSIS — G4730 Sleep apnea, unspecified: Secondary | ICD-10-CM | POA: Insufficient documentation

## 2018-08-02 DIAGNOSIS — E1151 Type 2 diabetes mellitus with diabetic peripheral angiopathy without gangrene: Secondary | ICD-10-CM | POA: Diagnosis not present

## 2018-08-02 DIAGNOSIS — N185 Chronic kidney disease, stage 5: Secondary | ICD-10-CM | POA: Diagnosis not present

## 2018-08-02 DIAGNOSIS — F329 Major depressive disorder, single episode, unspecified: Secondary | ICD-10-CM | POA: Diagnosis not present

## 2018-08-02 DIAGNOSIS — G709 Myoneural disorder, unspecified: Secondary | ICD-10-CM | POA: Insufficient documentation

## 2018-08-02 DIAGNOSIS — M1990 Unspecified osteoarthritis, unspecified site: Secondary | ICD-10-CM | POA: Diagnosis not present

## 2018-08-02 DIAGNOSIS — D631 Anemia in chronic kidney disease: Secondary | ICD-10-CM | POA: Insufficient documentation

## 2018-08-02 DIAGNOSIS — E1122 Type 2 diabetes mellitus with diabetic chronic kidney disease: Secondary | ICD-10-CM | POA: Insufficient documentation

## 2018-08-02 DIAGNOSIS — K219 Gastro-esophageal reflux disease without esophagitis: Secondary | ICD-10-CM | POA: Insufficient documentation

## 2018-08-02 DIAGNOSIS — Z7984 Long term (current) use of oral hypoglycemic drugs: Secondary | ICD-10-CM | POA: Insufficient documentation

## 2018-08-02 DIAGNOSIS — I252 Old myocardial infarction: Secondary | ICD-10-CM | POA: Diagnosis not present

## 2018-08-02 DIAGNOSIS — N189 Chronic kidney disease, unspecified: Secondary | ICD-10-CM | POA: Diagnosis present

## 2018-08-02 DIAGNOSIS — N183 Chronic kidney disease, stage 3 unspecified: Secondary | ICD-10-CM

## 2018-08-02 HISTORY — PX: AV FISTULA PLACEMENT: SHX1204

## 2018-08-02 LAB — POCT I-STAT 4, (NA,K, GLUC, HGB,HCT)
Glucose, Bld: 120 mg/dL — ABNORMAL HIGH (ref 70–99)
HCT: 41 % (ref 36.0–46.0)
Hemoglobin: 13.9 g/dL (ref 12.0–15.0)
Potassium: 4.2 mmol/L (ref 3.5–5.1)
Sodium: 137 mmol/L (ref 135–145)

## 2018-08-02 SURGERY — ARTERIOVENOUS (AV) FISTULA CREATION
Anesthesia: General | Laterality: Left

## 2018-08-02 MED ORDER — CLINDAMYCIN PHOSPHATE 900 MG/50ML IV SOLN
900.0000 mg | INTRAVENOUS | Status: AC
  Administered 2018-08-02: 900 mg via INTRAVENOUS
  Filled 2018-08-02: qty 50

## 2018-08-02 MED ORDER — TRAMADOL HCL 50 MG PO TABS
50.0000 mg | ORAL_TABLET | Freq: Once | ORAL | Status: AC
  Administered 2018-08-02: 10:00:00 50 mg via ORAL

## 2018-08-02 MED ORDER — 0.9 % SODIUM CHLORIDE (POUR BTL) OPTIME
TOPICAL | Status: DC | PRN
  Administered 2018-08-02: 1000 mL

## 2018-08-02 MED ORDER — DIPHENHYDRAMINE HCL 50 MG/ML IJ SOLN
INTRAMUSCULAR | Status: AC
  Filled 2018-08-02: qty 1

## 2018-08-02 MED ORDER — ONDANSETRON HCL 4 MG/2ML IJ SOLN
INTRAMUSCULAR | Status: DC | PRN
  Administered 2018-08-02: 4 mg via INTRAVENOUS

## 2018-08-02 MED ORDER — MIDAZOLAM HCL 2 MG/2ML IJ SOLN
INTRAMUSCULAR | Status: AC
  Filled 2018-08-02: qty 2

## 2018-08-02 MED ORDER — PROPOFOL 10 MG/ML IV BOLUS
INTRAVENOUS | Status: AC
  Filled 2018-08-02: qty 20

## 2018-08-02 MED ORDER — SODIUM CHLORIDE 0.9 % IV SOLN
INTRAVENOUS | Status: AC
  Filled 2018-08-02: qty 1.2

## 2018-08-02 MED ORDER — DIPHENHYDRAMINE HCL 50 MG/ML IJ SOLN
INTRAMUSCULAR | Status: DC | PRN
  Administered 2018-08-02: 25 mg via INTRAVENOUS

## 2018-08-02 MED ORDER — FENTANYL CITRATE (PF) 250 MCG/5ML IJ SOLN
INTRAMUSCULAR | Status: AC
  Filled 2018-08-02: qty 5

## 2018-08-02 MED ORDER — TRAMADOL HCL 50 MG PO TABS: 50.0000 mg | ORAL_TABLET | Freq: Four times a day (QID) | ORAL | 0 refills | Status: DC | PRN

## 2018-08-02 MED ORDER — SODIUM CHLORIDE 0.9 % IV SOLN
INTRAVENOUS | Status: DC | PRN
  Administered 2018-08-02: 25 ug/min via INTRAVENOUS

## 2018-08-02 MED ORDER — EPHEDRINE SULFATE-NACL 50-0.9 MG/10ML-% IV SOSY
PREFILLED_SYRINGE | INTRAVENOUS | Status: DC | PRN
  Administered 2018-08-02: 10 mg via INTRAVENOUS

## 2018-08-02 MED ORDER — LIDOCAINE-EPINEPHRINE (PF) 1 %-1:200000 IJ SOLN
INTRAMUSCULAR | Status: AC
  Filled 2018-08-02: qty 30

## 2018-08-02 MED ORDER — FENTANYL CITRATE (PF) 100 MCG/2ML IJ SOLN
INTRAMUSCULAR | Status: DC | PRN
  Administered 2018-08-02 (×2): 50 ug via INTRAVENOUS

## 2018-08-02 MED ORDER — SODIUM CHLORIDE 0.9 % IV SOLN
INTRAVENOUS | Status: DC | PRN
  Administered 2018-08-02: 500 mL

## 2018-08-02 MED ORDER — LIDOCAINE 2% (20 MG/ML) 5 ML SYRINGE
INTRAMUSCULAR | Status: DC | PRN
  Administered 2018-08-02: 100 mg via INTRAVENOUS

## 2018-08-02 MED ORDER — PROPOFOL 10 MG/ML IV BOLUS
INTRAVENOUS | Status: DC | PRN
  Administered 2018-08-02: 150 mg via INTRAVENOUS

## 2018-08-02 MED ORDER — TRAMADOL HCL 50 MG PO TABS
ORAL_TABLET | ORAL | Status: AC
  Filled 2018-08-02: qty 1

## 2018-08-02 MED ORDER — SODIUM CHLORIDE 0.9 % IV SOLN
INTRAVENOUS | Status: DC
  Administered 2018-08-02: 07:00:00 via INTRAVENOUS

## 2018-08-02 MED ORDER — PHENYLEPHRINE 40 MCG/ML (10ML) SYRINGE FOR IV PUSH (FOR BLOOD PRESSURE SUPPORT)
PREFILLED_SYRINGE | INTRAVENOUS | Status: DC | PRN
  Administered 2018-08-02: 120 ug via INTRAVENOUS

## 2018-08-02 SURGICAL SUPPLY — 35 items
ADH SKN CLS APL DERMABOND .7 (GAUZE/BANDAGES/DRESSINGS) ×1
ARMBAND PINK RESTRICT EXTREMIT (MISCELLANEOUS) ×3 IMPLANT
CANISTER SUCT 3000ML PPV (MISCELLANEOUS) ×3 IMPLANT
CLIP VESOCCLUDE MED 6/CT (CLIP) ×3 IMPLANT
CLIP VESOCCLUDE SM WIDE 6/CT (CLIP) ×3 IMPLANT
COVER PROBE W GEL 5X96 (DRAPES) IMPLANT
COVER WAND RF STERILE (DRAPES) ×3 IMPLANT
DERMABOND ADVANCED (GAUZE/BANDAGES/DRESSINGS) ×2
DERMABOND ADVANCED .7 DNX12 (GAUZE/BANDAGES/DRESSINGS) ×1 IMPLANT
ELECT REM PT RETURN 9FT ADLT (ELECTROSURGICAL) ×3
ELECTRODE REM PT RTRN 9FT ADLT (ELECTROSURGICAL) ×1 IMPLANT
GLOVE BIO SURGEON STRL SZ7.5 (GLOVE) ×3 IMPLANT
GLOVE BIOGEL PI IND STRL 6.5 (GLOVE) IMPLANT
GLOVE BIOGEL PI IND STRL 8.5 (GLOVE) IMPLANT
GLOVE BIOGEL PI INDICATOR 6.5 (GLOVE) ×6
GLOVE BIOGEL PI INDICATOR 8.5 (GLOVE) ×2
GLOVE SURG SS PI 6.5 STRL IVOR (GLOVE) ×2 IMPLANT
GLOVE SURG SS PI 7.5 STRL IVOR (GLOVE) ×2 IMPLANT
GOWN STRL REUS W/ TWL LRG LVL3 (GOWN DISPOSABLE) ×2 IMPLANT
GOWN STRL REUS W/ TWL XL LVL3 (GOWN DISPOSABLE) ×1 IMPLANT
GOWN STRL REUS W/TWL LRG LVL3 (GOWN DISPOSABLE) ×6
GOWN STRL REUS W/TWL XL LVL3 (GOWN DISPOSABLE) ×3
INSERT FOGARTY SM (MISCELLANEOUS) IMPLANT
KIT BASIN OR (CUSTOM PROCEDURE TRAY) ×3 IMPLANT
KIT TURNOVER KIT B (KITS) ×3 IMPLANT
NS IRRIG 1000ML POUR BTL (IV SOLUTION) ×3 IMPLANT
PACK CV ACCESS (CUSTOM PROCEDURE TRAY) ×3 IMPLANT
PAD ARMBOARD 7.5X6 YLW CONV (MISCELLANEOUS) ×6 IMPLANT
SUT MNCRL AB 4-0 PS2 18 (SUTURE) ×3 IMPLANT
SUT PROLENE 6 0 BV (SUTURE) ×5 IMPLANT
SUT VIC AB 3-0 SH 27 (SUTURE) ×3
SUT VIC AB 3-0 SH 27X BRD (SUTURE) ×1 IMPLANT
TOWEL GREEN STERILE (TOWEL DISPOSABLE) ×3 IMPLANT
UNDERPAD 30X30 (UNDERPADS AND DIAPERS) ×3 IMPLANT
WATER STERILE IRR 1000ML POUR (IV SOLUTION) ×3 IMPLANT

## 2018-08-02 NOTE — Op Note (Signed)
    Patient name: [name] MRN: [id number] DOB: 20-Jun-1940 Sex: female  08/02/2018 Pre-operative Diagnosis: Chronic kidney disease Post-operative diagnosis:  Same Surgeon:  Eda Paschal. Donzetta Matters, MD Assistant: Leontine Locket, PA Procedure Performed:  Left arm for stage basilic vein fistula creation  Indications: 78 year old female with chronic kidney disease.  She has now indicated for permanent access.  She appears to have suitable basilic vein on the left but is also consented for cephalic vein fistula versus graft.  Findings: Cephalic vein was actually large above the antecubital but then there were multiple branches was quite small in the upper arm.  Basilic vein was approximately 5 mm in the antecubitum approximately 3-1/2 further up in the arm.  After fistula [name] was a strong thrill and palpable radial pulse at the wrist.   Procedure:  The patient was identified in the holding area and taken to the operating room where she placed upon the operative table and general anesthesia was induced with LMA.  Clindamycin was administered to the patient timeout was called.  Ultrasound was used to identify the basilic veins appear to be suitable for access creation.  Transverse incision was made between the palpable brachial artery and the vein.  I dissected out the vein margin for orientation.  Divided to the deep fascia identified the brachial artery and placed a vessel loop around this.  The vein was then transected distally and tied off.  The artery was clamped this approximate opened longitudinally flushed with heparinized and then by directions.  Vein was sewn end-to-side with 6-0 Prolene suture.  Both post anastomosis we allowed flushing all directions.  Upon completion there was a strong thrill there is a palpable radial pulse at the wrist both were confirmed with Doppler.  We irrigated the wound and obtain hemostasis closed in layers of Vicryl Monocryl.  Dermabond was placed to level skin.  She  was awakened anesthesia having tolerated procedure without immediate complication.  All counts were correct at completion.  EBL: 20 cc   Dave Mergen C. Donzetta Matters, MD [facility] [place] Office: [phone] Pager: [phone]

## 2018-08-02 NOTE — Anesthesia Procedure Notes (Signed)
Procedure Name: LMA Insertion Date/Time: 08/02/2018 7:40 AM Performed by: Marsa Aris, CRNA Pre-anesthesia Checklist: Patient identified, Emergency Drugs available, Suction available and Patient being monitored Patient Re-evaluated:Patient Re-evaluated prior to induction Oxygen Delivery Method: Circle System Utilized Preoxygenation: Pre-oxygenation with 100% oxygen Induction Type: IV induction Ventilation: Mask ventilation without difficulty LMA: LMA inserted LMA Size: 4.0 Number of attempts: 1 Airway Equipment and Method: Bite block Placement Confirmation: positive ETCO2 Tube secured with: Tape Dental Injury: Teeth and Oropharynx as per pre-operative assessment

## 2018-08-02 NOTE — H&P (Signed)
   History and Physical Update  The patient was interviewed and re-examined.  The patient's previous History and Physical has been reviewed and is unchanged from recent office visit. Plan for left arm avf vs graft today in OR.   Calven Gilkes C. Donzetta Matters, MD [facility] [place] Office: [phone] Pager: [phone]   08/02/2018, 7:24 AM

## 2018-08-02 NOTE — Discharge Instructions (Signed)
° °  [facility]  Discharge Instructions  AV Fistula or Graft Surgery for Dialysis Access  Please refer to the following instructions for your post-procedure care. Your surgeon or physician assistant will discuss any changes with you.  Activity  You may drive the day following your surgery, if you are comfortable and no longer taking prescription pain medication. Resume full activity as the soreness in your incision resolves.  Bathing/Showering  You may shower after you go home. Keep your incision dry for 48 hours. Do not soak in a bathtub, hot tub, or swim until the incision heals completely. You may not shower if you have a hemodialysis catheter.  Incision Care  Clean your incision with mild soap and water after 48 hours. Pat the area dry with a clean towel. You do not need a bandage unless otherwise instructed. Do not apply any ointments or creams to your incision. You may have skin glue on your incision. Do not peel it off. It will come off on its own in about one week. Your arm may swell a bit after surgery. To reduce swelling use pillows to elevate your arm so it is above your heart. Your doctor will tell you if you need to lightly wrap your arm with an ACE bandage.  Diet  Resume your normal diet. There are not special food restrictions following this procedure. In order to heal from your surgery, it is CRITICAL to get adequate nutrition. Your body requires vitamins, minerals, and protein. Vegetables are the best source of vitamins and minerals. Vegetables also provide the perfect balance of protein. Processed food has little nutritional value, so try to avoid this.  Medications  Resume taking all of your medications. If your incision is causing pain, you may take over-the counter pain relievers such as acetaminophen (Tylenol). If you were prescribed a stronger pain medication, please be aware these medications can cause nausea and constipation. Prevent  nausea by taking the medication with a snack or meal. Avoid constipation by drinking plenty of fluids and eating foods with high amount of fiber, such as fruits, vegetables, and grains.  Do not take Tylenol if you are taking prescription pain medications.  Follow up Your surgeon may want to see you in the office following your access surgery. If so, this will be arranged at the time of your surgery.  Please call us immediately for any of the following conditions:  Increased pain, redness, drainage (pus) from your incision site Fever of 101 degrees or higher Severe or worsening pain at your incision site Hand pain or numbness.  Reduce your risk of vascular disease:  Stop smoking. If you would like help, call [facility] at 1-800-QUIT-NOW 9851660025) or [place] at [place] your cholesterol Maintain a desired weight Control your diabetes Keep your blood pressure down  Dialysis  It will take several weeks to several months for your new dialysis access to be ready for use. Your surgeon will determine when it is okay to use it. Your nephrologist will continue to direct your dialysis. You can continue to use your Permcath until your new access is ready for use.   08/02/2018 [name] [id number] [birth year]  Surgeon(s): Waynetta Sandy, MD  Procedure(s): Creation left 1st stage basilic vein transposition   x Do not stick fistula for 12 weeks    If you have any questions, please call the office at [phone].

## 2018-08-02 NOTE — Anesthesia Postprocedure Evaluation (Signed)
Anesthesia Post Note  Patient: [name]  Procedure(s) Performed: ARTERIOVENOUS (AV) FISTULA CREATION LEFT ARM (Left )     Patient location during evaluation: PACU Anesthesia Type: General Level of consciousness: sedated Pain management: pain level controlled Vital Signs Assessment: post-procedure vital signs reviewed and stable Respiratory status: spontaneous breathing and respiratory function stable Cardiovascular status: stable Postop Assessment: no apparent nausea or vomiting Anesthetic complications: no    Last Vitals:  Vitals:   08/02/18 0909 08/02/18 0923  BP: 121/66 121/72  Pulse: 61 62  Resp: 16 16  Temp:    SpO2: 93% 92%    Last Pain:  Vitals:   08/02/18 0933  TempSrc:   PainSc: 5                  Welford Christmas DANIEL

## 2018-08-02 NOTE — Transfer of Care (Signed)
Immediate Anesthesia Transfer of Care Note  Patient: [name]  Procedure(s) Performed: ARTERIOVENOUS (AV) FISTULA CREATION LEFT ARM (Left )  Patient Location: PACU  Anesthesia Type:General  Level of Consciousness: awake, alert  and oriented  Airway & Oxygen Therapy: Patient Spontanous Breathing and Patient connected to face mask oxygen  Post-op Assessment: Report given to RN and Post -op Vital signs reviewed and stable  Post vital signs: Reviewed and stable  Last Vitals:  Vitals Value Taken Time  BP 138/74 08/02/18 0846  Temp    Pulse 64 08/02/18 0848  Resp 19 08/02/18 0848  SpO2 99 % 08/02/18 0848  Vitals shown include unvalidated device data.  Last Pain:  Vitals:   08/02/18 0659  TempSrc: Oral         Complications: No apparent anesthesia complications

## 2018-08-02 NOTE — Anesthesia Preprocedure Evaluation (Signed)
Anesthesia Evaluation  Patient identified by MRN, date of birth, ID band Patient awake    Reviewed: Allergy & Precautions, NPO status , Patient's Chart, lab work & pertinent test results  History of Anesthesia Complications (+) PONV, PROLONGED EMERGENCE, Family history of anesthesia reaction and history of anesthetic complications  Airway Mallampati: II  TM Distance: >3 FB     Dental  (+) Dental Advisory Given, Missing   Pulmonary shortness of breath and with exertion, asthma , sleep apnea and Oxygen sleep apnea ,    Pulmonary exam normal        Cardiovascular hypertension, Pt. on medications and Pt. on home beta blockers + angina + CAD, + Past MI, + Peripheral Vascular Disease and +CHF  Normal cardiovascular exam+ dysrhythmias   ECHO: 08-02-13: Study Conclusions  - Left ventricle: The cavity size was normal. There was mild concentric hypertrophy. Systolic function was normal. The estimated ejection fraction was in the range of 55% to 60%. Wall motion was normal; there were no regional wall motion abnormalities. Doppler parameters are consistent with abnormal left ventricular relaxation (grade 1 diastolic dysfunction). - Mitral valve: Calcified annulus. There was trivial regurgitation. - Atrial septum: No defect or patent foramen ovale was identified.     Neuro/Psych PSYCHIATRIC DISORDERS Anxiety Depression  Neuromuscular disease    GI/Hepatic Neg liver ROS, hiatal hernia, GERD  Medicated,  Endo/Other  diabetes, Type 2, Insulin Dependent, Oral Hypoglycemic AgentsHypothyroidism Morbid obesity  Renal/GU Renal diseaseCr 1.52 K 4.2  negative genitourinary   Musculoskeletal  (+) Arthritis ,   Abdominal (+) - obese,   Peds negative pediatric ROS (+)  Hematology  (+) anemia , Hgb 9.0   Anesthesia Other Findings   Reproductive/Obstetrics negative OB ROS                              Anesthesia Physical  Anesthesia Plan  ASA: IV  Anesthesia Plan: General   Post-op Pain Management:    Induction: Intravenous  PONV Risk Score and Plan: 3 and Treatment may vary due to age or medical condition, Ondansetron and Diphenhydramine  Airway Management Planned: LMA  Additional Equipment:   Intra-op Plan:   Post-operative Plan: Extubation in OR  Informed Consent: I have reviewed the patients History and Physical, chart, labs and discussed the procedure including the risks, benefits and alternatives for the proposed anesthesia with the patient or authorized representative who has indicated his/her understanding and acceptance.     Dental advisory given  Plan Discussed with: CRNA and Anesthesiologist  Anesthesia Plan Comments:         Anesthesia Quick Evaluation

## 2018-08-03 ENCOUNTER — Encounter (HOSPITAL_COMMUNITY): Payer: Self-pay | Admitting: Vascular Surgery

## 2018-08-04 ENCOUNTER — Encounter (HOSPITAL_COMMUNITY): Payer: Medicare HMO

## 2018-08-04 ENCOUNTER — Encounter: Payer: Medicare HMO | Admitting: Vascular Surgery

## 2018-08-04 ENCOUNTER — Other Ambulatory Visit (HOSPITAL_COMMUNITY): Payer: Medicare HMO

## 2018-08-22 ENCOUNTER — Telehealth: Payer: Self-pay | Admitting: *Deleted

## 2018-08-22 ENCOUNTER — Telehealth (HOSPITAL_COMMUNITY): Payer: Self-pay | Admitting: *Deleted

## 2018-08-22 NOTE — Telephone Encounter (Signed)
She states PCP has retired. Instructed to go to the closest ER.

## 2018-08-22 NOTE — Telephone Encounter (Signed)
Return call to patient's daughter from Triage line. No answer left voice mail.

## 2018-08-22 NOTE — Telephone Encounter (Signed)
Call from patient's daughter who states patient's arm looks fine, but patient is having shortness of breath. Claims she has been since surgery. She is also upset that she did not get a detailed post-op call from Dr. Donzetta Matters. Patient was sent home with albuterol, but states has not used a Nebulizer in years and Doctor did not give any operative details about breathing issues in surgery. Informed her I could not get Dr. Donzetta Matters to call her about this today, but the most important thing to do now is to get patient evaluated.  I instructed her to get patient to be seen ASAP to be evaluated for Shortness of breath.

## 2018-08-22 NOTE — Telephone Encounter (Signed)
Talked with pts daughter regarding increased shortness of breath, belly feels tight weight was up 2lbs. Instructed to take extra 10mg  of toresmide the next 2 days and see if this helps with abdominal bloating and breathing. Encouraged daughter to get pt established with PCP since hers retired as well.

## 2018-08-25 ENCOUNTER — Other Ambulatory Visit: Payer: Self-pay

## 2018-08-25 ENCOUNTER — Telehealth (HOSPITAL_COMMUNITY): Payer: Self-pay | Admitting: *Deleted

## 2018-08-25 ENCOUNTER — Encounter (HOSPITAL_COMMUNITY): Payer: Self-pay | Admitting: *Deleted

## 2018-08-25 ENCOUNTER — Inpatient Hospital Stay (HOSPITAL_COMMUNITY)
Admission: EM | Admit: 2018-08-25 | Discharge: 2018-08-31 | DRG: 291 | Disposition: A | Discharge status: Home Health | Payer: Medicare HMO | Attending: Family Medicine | Admitting: Family Medicine

## 2018-08-25 ENCOUNTER — Emergency Department (HOSPITAL_COMMUNITY): Payer: Medicare HMO

## 2018-08-25 DIAGNOSIS — E785 Hyperlipidemia, unspecified: Secondary | ICD-10-CM | POA: Diagnosis present

## 2018-08-25 DIAGNOSIS — Z7901 Long term (current) use of anticoagulants: Secondary | ICD-10-CM

## 2018-08-25 DIAGNOSIS — Z8249 Family history of ischemic heart disease and other diseases of the circulatory system: Secondary | ICD-10-CM

## 2018-08-25 DIAGNOSIS — Z20828 Contact with and (suspected) exposure to other viral communicable diseases: Secondary | ICD-10-CM | POA: Diagnosis present

## 2018-08-25 DIAGNOSIS — I4891 Unspecified atrial fibrillation: Secondary | ICD-10-CM | POA: Diagnosis present

## 2018-08-25 DIAGNOSIS — J9611 Chronic respiratory failure with hypoxia: Secondary | ICD-10-CM | POA: Diagnosis present

## 2018-08-25 DIAGNOSIS — N185 Chronic kidney disease, stage 5: Secondary | ICD-10-CM | POA: Diagnosis present

## 2018-08-25 DIAGNOSIS — D631 Anemia in chronic kidney disease: Secondary | ICD-10-CM | POA: Diagnosis present

## 2018-08-25 DIAGNOSIS — Z833 Family history of diabetes mellitus: Secondary | ICD-10-CM

## 2018-08-25 DIAGNOSIS — Z955 Presence of coronary angioplasty implant and graft: Secondary | ICD-10-CM | POA: Diagnosis not present

## 2018-08-25 DIAGNOSIS — E1122 Type 2 diabetes mellitus with diabetic chronic kidney disease: Secondary | ICD-10-CM | POA: Diagnosis present

## 2018-08-25 DIAGNOSIS — N189 Chronic kidney disease, unspecified: Secondary | ICD-10-CM | POA: Diagnosis not present

## 2018-08-25 DIAGNOSIS — N2581 Secondary hyperparathyroidism of renal origin: Secondary | ICD-10-CM | POA: Diagnosis present

## 2018-08-25 DIAGNOSIS — Z9981 Dependence on supplemental oxygen: Secondary | ICD-10-CM | POA: Diagnosis not present

## 2018-08-25 DIAGNOSIS — E1140 Type 2 diabetes mellitus with diabetic neuropathy, unspecified: Secondary | ICD-10-CM

## 2018-08-25 DIAGNOSIS — I480 Paroxysmal atrial fibrillation: Secondary | ICD-10-CM | POA: Diagnosis present

## 2018-08-25 DIAGNOSIS — E039 Hypothyroidism, unspecified: Secondary | ICD-10-CM | POA: Diagnosis present

## 2018-08-25 DIAGNOSIS — I509 Heart failure, unspecified: Secondary | ICD-10-CM

## 2018-08-25 DIAGNOSIS — I25118 Atherosclerotic heart disease of native coronary artery with other forms of angina pectoris: Secondary | ICD-10-CM | POA: Diagnosis present

## 2018-08-25 DIAGNOSIS — I132 Hypertensive heart and chronic kidney disease with heart failure and with stage 5 chronic kidney disease, or end stage renal disease: Principal | ICD-10-CM | POA: Diagnosis present

## 2018-08-25 DIAGNOSIS — Z885 Allergy status to narcotic agent status: Secondary | ICD-10-CM

## 2018-08-25 DIAGNOSIS — Z794 Long term (current) use of insulin: Secondary | ICD-10-CM | POA: Diagnosis not present

## 2018-08-25 DIAGNOSIS — Z6841 Body Mass Index (BMI) 40.0 and over, adult: Secondary | ICD-10-CM

## 2018-08-25 DIAGNOSIS — I5043 Acute on chronic combined systolic (congestive) and diastolic (congestive) heart failure: Secondary | ICD-10-CM | POA: Diagnosis present

## 2018-08-25 DIAGNOSIS — E876 Hypokalemia: Secondary | ICD-10-CM | POA: Diagnosis not present

## 2018-08-25 DIAGNOSIS — Z9119 Patient's noncompliance with other medical treatment and regimen: Secondary | ICD-10-CM

## 2018-08-25 DIAGNOSIS — Z992 Dependence on renal dialysis: Secondary | ICD-10-CM | POA: Diagnosis not present

## 2018-08-25 DIAGNOSIS — Z85528 Personal history of other malignant neoplasm of kidney: Secondary | ICD-10-CM

## 2018-08-25 DIAGNOSIS — R0609 Other forms of dyspnea: Secondary | ICD-10-CM

## 2018-08-25 DIAGNOSIS — Z79890 Hormone replacement therapy (postmenopausal): Secondary | ICD-10-CM | POA: Diagnosis not present

## 2018-08-25 DIAGNOSIS — R072 Precordial pain: Secondary | ICD-10-CM

## 2018-08-25 DIAGNOSIS — Z905 Acquired absence of kidney: Secondary | ICD-10-CM

## 2018-08-25 DIAGNOSIS — N179 Acute kidney failure, unspecified: Secondary | ICD-10-CM | POA: Diagnosis present

## 2018-08-25 DIAGNOSIS — I4819 Other persistent atrial fibrillation: Secondary | ICD-10-CM | POA: Diagnosis not present

## 2018-08-25 DIAGNOSIS — J811 Chronic pulmonary edema: Secondary | ICD-10-CM | POA: Insufficient documentation

## 2018-08-25 DIAGNOSIS — R0602 Shortness of breath: Secondary | ICD-10-CM

## 2018-08-25 DIAGNOSIS — I252 Old myocardial infarction: Secondary | ICD-10-CM

## 2018-08-25 DIAGNOSIS — I4892 Unspecified atrial flutter: Secondary | ICD-10-CM | POA: Diagnosis present

## 2018-08-25 DIAGNOSIS — I2510 Atherosclerotic heart disease of native coronary artery without angina pectoris: Secondary | ICD-10-CM | POA: Diagnosis present

## 2018-08-25 DIAGNOSIS — G4733 Obstructive sleep apnea (adult) (pediatric): Secondary | ICD-10-CM | POA: Diagnosis present

## 2018-08-25 DIAGNOSIS — I4811 Longstanding persistent atrial fibrillation: Secondary | ICD-10-CM | POA: Diagnosis not present

## 2018-08-25 DIAGNOSIS — Z880 Allergy status to penicillin: Secondary | ICD-10-CM

## 2018-08-25 DIAGNOSIS — G510 Bell's palsy: Secondary | ICD-10-CM | POA: Diagnosis present

## 2018-08-25 DIAGNOSIS — I5033 Acute on chronic diastolic (congestive) heart failure: Secondary | ICD-10-CM | POA: Diagnosis not present

## 2018-08-25 HISTORY — DX: Chronic pulmonary edema: J81.1

## 2018-08-25 LAB — CBC WITH DIFFERENTIAL/PLATELET
Abs Immature Granulocytes: 0.06 10*3/uL (ref 0.00–0.07)
Basophils Absolute: 0.1 10*3/uL (ref 0.0–0.1)
Basophils Relative: 1 %
Eosinophils Absolute: 0 10*3/uL (ref 0.0–0.5)
Eosinophils Relative: 0 %
HCT: 37.6 % (ref 36.0–46.0)
Hemoglobin: 10.8 g/dL — ABNORMAL LOW (ref 12.0–15.0)
Immature Granulocytes: 1 %
Lymphocytes Relative: 25 %
Lymphs Abs: 2.1 10*3/uL (ref 0.7–4.0)
MCH: 26.2 pg (ref 26.0–34.0)
MCHC: 28.7 g/dL — ABNORMAL LOW (ref 30.0–36.0)
MCV: 91.3 fL (ref 80.0–100.0)
Monocytes Absolute: 0.8 10*3/uL (ref 0.1–1.0)
Monocytes Relative: 10 %
Neutro Abs: 5.3 10*3/uL (ref 1.7–7.7)
Neutrophils Relative %: 63 %
Platelets: 269 10*3/uL (ref 150–400)
RBC: 4.12 MIL/uL (ref 3.87–5.11)
RDW: 16.1 % — ABNORMAL HIGH (ref 11.5–15.5)
WBC: 8.3 10*3/uL (ref 4.0–10.5)
nRBC: 0 % (ref 0.0–0.2)

## 2018-08-25 LAB — SARS CORONAVIRUS 2 BY RT PCR (HOSPITAL ORDER, PERFORMED IN ~~LOC~~ HOSPITAL LAB): SARS Coronavirus 2: NEGATIVE

## 2018-08-25 LAB — COMPREHENSIVE METABOLIC PANEL
ALT: 14 U/L (ref 0–44)
AST: 20 U/L (ref 15–41)
Albumin: 3.4 g/dL — ABNORMAL LOW (ref 3.5–5.0)
Alkaline Phosphatase: 69 U/L (ref 38–126)
Anion gap: 8 (ref 5–15)
BUN: 34 mg/dL — ABNORMAL HIGH (ref 8–23)
CO2: 27 mmol/L (ref 22–32)
Calcium: 8.8 mg/dL — ABNORMAL LOW (ref 8.9–10.3)
Chloride: 103 mmol/L (ref 98–111)
Creatinine, Ser: 3.38 mg/dL — ABNORMAL HIGH (ref 0.44–1.00)
GFR calc Af Amer: 15 mL/min — ABNORMAL LOW (ref >60)
GFR calc non Af Amer: 13 mL/min — ABNORMAL LOW (ref >60)
Glucose, Bld: 99 mg/dL (ref 70–99)
Potassium: 3.9 mmol/L (ref 3.5–5.1)
Sodium: 138 mmol/L (ref 135–145)
Total Bilirubin: 0.6 mg/dL (ref 0.3–1.2)
Total Protein: 6.7 g/dL (ref 6.5–8.1)

## 2018-08-25 LAB — TROPONIN I (HIGH SENSITIVITY)
Troponin I (High Sensitivity): 9 ng/L (ref <18)
Troponin I (High Sensitivity): 10 ng/L (ref <18)

## 2018-08-25 LAB — BRAIN NATRIURETIC PEPTIDE: B Natriuretic Peptide: 344 pg/mL — ABNORMAL HIGH (ref 0.0–100.0)

## 2018-08-25 LAB — CBG MONITORING, ED: Glucose-Capillary: 60 mg/dL — ABNORMAL LOW (ref 70–99)

## 2018-08-25 MED ORDER — DILTIAZEM HCL ER COATED BEADS 120 MG PO CP24
120.0000 mg | ORAL_CAPSULE | Freq: Every day | ORAL | Status: DC
  Administered 2018-08-26 – 2018-08-31 (×6): 120 mg via ORAL
  Filled 2018-08-25 (×9): qty 1

## 2018-08-25 MED ORDER — SODIUM CHLORIDE 0.9 % IV SOLN
250.0000 mL | INTRAVENOUS | Status: DC | PRN
  Administered 2018-08-29: 250 mL via INTRAVENOUS

## 2018-08-25 MED ORDER — SODIUM CHLORIDE 0.9% FLUSH
3.0000 mL | Freq: Two times a day (BID) | INTRAVENOUS | Status: DC
  Administered 2018-08-26 – 2018-08-31 (×11): 3 mL via INTRAVENOUS

## 2018-08-25 MED ORDER — INSULIN ASPART 100 UNIT/ML ~~LOC~~ SOLN
0.0000 [IU] | Freq: Three times a day (TID) | SUBCUTANEOUS | Status: DC
  Administered 2018-08-26: 1 [IU] via SUBCUTANEOUS
  Administered 2018-08-27 – 2018-08-28 (×6): 2 [IU] via SUBCUTANEOUS
  Administered 2018-08-29: 3 [IU] via SUBCUTANEOUS
  Administered 2018-08-29 – 2018-08-31 (×6): 2 [IU] via SUBCUTANEOUS

## 2018-08-25 MED ORDER — FAMOTIDINE 20 MG PO TABS
20.0000 mg | ORAL_TABLET | Freq: Every day | ORAL | Status: DC
  Administered 2018-08-26 – 2018-08-31 (×6): 20 mg via ORAL
  Filled 2018-08-25 (×6): qty 1

## 2018-08-25 MED ORDER — MAGNESIUM OXIDE 400 (241.3 MG) MG PO TABS
200.0000 mg | ORAL_TABLET | ORAL | Status: DC
  Administered 2018-08-26 – 2018-08-30 (×3): 200 mg via ORAL
  Filled 2018-08-25 (×2): qty 0.5
  Filled 2018-08-25 (×2): qty 1
  Filled 2018-08-25: qty 0.5
  Filled 2018-08-25: qty 1

## 2018-08-25 MED ORDER — CLONAZEPAM 0.5 MG PO TABS
0.5000 mg | ORAL_TABLET | Freq: Three times a day (TID) | ORAL | Status: DC | PRN
  Administered 2018-08-28 – 2018-08-31 (×3): 0.5 mg via ORAL
  Filled 2018-08-25 (×4): qty 1

## 2018-08-25 MED ORDER — ESTRADIOL 1 MG PO TABS
1.0000 mg | ORAL_TABLET | Freq: Every day | ORAL | Status: DC
  Administered 2018-08-26 – 2018-08-31 (×6): 1 mg via ORAL
  Filled 2018-08-25 (×10): qty 1

## 2018-08-25 MED ORDER — INSULIN GLARGINE 100 UNIT/ML ~~LOC~~ SOLN
5.0000 [IU] | Freq: Two times a day (BID) | SUBCUTANEOUS | Status: DC
  Filled 2018-08-25 (×8): qty 0.35

## 2018-08-25 MED ORDER — AMIODARONE HCL 100 MG PO TABS
100.0000 mg | ORAL_TABLET | Freq: Every day | ORAL | Status: DC
  Administered 2018-08-26: 100 mg via ORAL
  Filled 2018-08-25: qty 1

## 2018-08-25 MED ORDER — TORSEMIDE 20 MG PO TABS
40.0000 mg | ORAL_TABLET | Freq: Every day | ORAL | Status: DC
  Administered 2018-08-25: 40 mg via ORAL
  Filled 2018-08-25 (×6): qty 2

## 2018-08-25 MED ORDER — SODIUM CHLORIDE 0.9% FLUSH: 3.0000 mL | INTRAVENOUS | Status: DC | PRN

## 2018-08-25 MED ORDER — METOPROLOL TARTRATE 12.5 MG HALF TABLET
12.5000 mg | ORAL_TABLET | Freq: Two times a day (BID) | ORAL | Status: DC
  Administered 2018-08-26 – 2018-08-31 (×12): 12.5 mg via ORAL
  Filled 2018-08-25 (×12): qty 1

## 2018-08-25 MED ORDER — HYPROMELLOSE (GONIOSCOPIC) 2.5 % OP SOLN
1.0000 [drp] | Freq: Three times a day (TID) | OPHTHALMIC | Status: DC | PRN
  Filled 2018-08-25: qty 15

## 2018-08-25 MED ORDER — APIXABAN 5 MG PO TABS
5.0000 mg | ORAL_TABLET | Freq: Two times a day (BID) | ORAL | Status: DC
  Administered 2018-08-26 – 2018-08-31 (×12): 5 mg via ORAL
  Filled 2018-08-25 (×12): qty 1

## 2018-08-25 MED ORDER — LEVOTHYROXINE SODIUM 25 MCG PO TABS
137.0000 ug | ORAL_TABLET | Freq: Every day | ORAL | Status: DC
  Administered 2018-08-26 – 2018-08-31 (×6): 137 ug via ORAL
  Filled 2018-08-25 (×10): qty 1
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
## 2018-08-25 NOTE — ED Notes (Signed)
Pt ambulated to BR with assist and back to bed.

## 2018-08-25 NOTE — ED Triage Notes (Signed)
Pt with mid cp and sob for 3 weeks, weight gain as well. Pt to start dialysis soon.

## 2018-08-25 NOTE — Telephone Encounter (Signed)
Daughter called in stating her mothers weight this morning is 265lbs which is up from Monday (242lbs) pt has been taking extra toresmide since Monday - increased shortness of breath. Per Roderic Palau NP - should proceed to the ER for further evaulation as with current kidney function will need close monitoring for labs/fluid removal. Daughter stated she will call for EMS now.
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
## 2018-08-26 DIAGNOSIS — I5043 Acute on chronic combined systolic (congestive) and diastolic (congestive) heart failure: Secondary | ICD-10-CM

## 2018-08-26 DIAGNOSIS — I5033 Acute on chronic diastolic (congestive) heart failure: Secondary | ICD-10-CM

## 2018-08-26 DIAGNOSIS — I4819 Other persistent atrial fibrillation: Secondary | ICD-10-CM

## 2018-08-26 HISTORY — DX: Acute on chronic combined systolic (congestive) and diastolic (congestive) heart failure: I50.43

## 2018-08-26 LAB — COMPREHENSIVE METABOLIC PANEL
ALT: 13 U/L (ref 0–44)
AST: 18 U/L (ref 15–41)
Albumin: 3.4 g/dL — ABNORMAL LOW (ref 3.5–5.0)
Alkaline Phosphatase: 64 U/L (ref 38–126)
Anion gap: 11 (ref 5–15)
BUN: 35 mg/dL — ABNORMAL HIGH (ref 8–23)
CO2: 27 mmol/L (ref 22–32)
Calcium: 8.9 mg/dL (ref 8.9–10.3)
Chloride: 102 mmol/L (ref 98–111)
Creatinine, Ser: 3.38 mg/dL — ABNORMAL HIGH (ref 0.44–1.00)
GFR calc Af Amer: 15 mL/min — ABNORMAL LOW (ref >60)
GFR calc non Af Amer: 13 mL/min — ABNORMAL LOW (ref >60)
Glucose, Bld: 83 mg/dL (ref 70–99)
Potassium: 4 mmol/L (ref 3.5–5.1)
Sodium: 140 mmol/L (ref 135–145)
Total Bilirubin: 0.6 mg/dL (ref 0.3–1.2)
Total Protein: 6.7 g/dL (ref 6.5–8.1)

## 2018-08-26 LAB — CBC
HCT: 36 % (ref 36.0–46.0)
Hemoglobin: 10.2 g/dL — ABNORMAL LOW (ref 12.0–15.0)
MCH: 25.8 pg — ABNORMAL LOW (ref 26.0–34.0)
MCHC: 28.3 g/dL — ABNORMAL LOW (ref 30.0–36.0)
MCV: 91.1 fL (ref 80.0–100.0)
Platelets: 277 10*3/uL (ref 150–400)
RBC: 3.95 MIL/uL (ref 3.87–5.11)
RDW: 16.1 % — ABNORMAL HIGH (ref 11.5–15.5)
WBC: 8.9 10*3/uL (ref 4.0–10.5)
nRBC: 0 % (ref 0.0–0.2)

## 2018-08-26 LAB — GLUCOSE, CAPILLARY
Glucose-Capillary: 125 mg/dL — ABNORMAL HIGH (ref 70–99)
Glucose-Capillary: 121 mg/dL — ABNORMAL HIGH (ref 70–99)

## 2018-08-26 LAB — CBG MONITORING, ED
Glucose-Capillary: 72 mg/dL (ref 70–99)
Glucose-Capillary: 78 mg/dL (ref 70–99)
Glucose-Capillary: 97 mg/dL (ref 70–99)

## 2018-08-26 LAB — HEMOGLOBIN A1C
Hgb A1c MFr Bld: 6.9 % — ABNORMAL HIGH (ref 4.8–5.6)
Mean Plasma Glucose: 151.33 mg/dL

## 2018-08-26 LAB — TSH: TSH: 1.933 u[IU]/mL (ref 0.350–4.500)

## 2018-08-26 MED ORDER — ACETAMINOPHEN 325 MG PO TABS
162.5000 mg | ORAL_TABLET | ORAL | Status: DC | PRN
  Administered 2018-08-26 – 2018-08-31 (×3): 162.5 mg via ORAL
  Filled 2018-08-26 (×2): qty 1

## 2018-08-26 MED ORDER — AMIODARONE HCL 200 MG PO TABS
200.0000 mg | ORAL_TABLET | Freq: Every day | ORAL | Status: DC
  Administered 2018-08-27 – 2018-08-31 (×5): 200 mg via ORAL
  Filled 2018-08-26 (×5): qty 1

## 2018-08-26 MED ORDER — BUMETANIDE 0.25 MG/ML IJ SOLN
2.0000 mg | Freq: Three times a day (TID) | INTRAMUSCULAR | Status: DC
  Administered 2018-08-26 – 2018-08-27 (×3): 2 mg via INTRAVENOUS
  Filled 2018-08-26 (×12): qty 8

## 2018-08-26 MED ORDER — POLYVINYL ALCOHOL 1.4 % OP SOLN
1.0000 [drp] | Freq: Three times a day (TID) | OPHTHALMIC | Status: DC | PRN
  Filled 2018-08-26: qty 15

## 2018-08-26 MED ORDER — ACETAMINOPHEN 325 MG PO TABS
650.0000 mg | ORAL_TABLET | Freq: Four times a day (QID) | ORAL | Status: DC | PRN
  Filled 2018-08-26: qty 2
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
## 2018-08-26 NOTE — ED Notes (Signed)
Pt resting with eyes closed, resp even and non labored,

## 2018-08-26 NOTE — Progress Notes (Signed)
Inpatient Diabetes Program Recommendations  AACE/ADA: New Consensus Statement on Inpatient Glycemic Control (2015)  Target Ranges:  Prepandial:   less than 140 mg/dL      Peak postprandial:   less than 180 mg/dL (1-2 hours)      Critically ill patients:  140 - 180 mg/dL   Lab Results  Component Value Date   GLUCAP 72 08/26/2018   HGBA1C 6.9 (H) 08/25/2018    Review of Glycemic Control Results for [name], [name] (MRN [id number]) as of 08/26/2018 10:25  Ref. Range 08/25/2018 21:31 08/26/2018 00:19 08/26/2018 08:30  Glucose-Capillary Latest Ref Range: 70 - 99 mg/dL 60 (L) 97 72   Diabetes history: DM2 Outpatient Diabetes medications: Per chart with endocrinologist Dr. Elyse Hsu 04/07/18 Lantus dosing: Before lunch:  20 units if sugar under 100 25 units if sugar 100-180 30 units if sugar 180-240 35 units if sugar over 240 At 10 PM:  5 units if sugar under 100 10 units if sugar 100-200 15 units if sugar over 200   Current orders for Inpatient glycemic control: Lantus 5-35 sliding scale bid + Novolog sensitive correction tid  Inpatient Diabetes Program Recommendations:   -D/C Lantus for now and restart as needed  Thank you, Bethena Roys E. Tauno Falotico, RN, MSN, CDE  Diabetes Coordinator Inpatient Glycemic Control Team Team Pager [phone] (8am-5pm) 08/26/2018 10:34 AM

## 2018-08-26 NOTE — Progress Notes (Signed)
PROGRESS NOTE  [name] YQM:[id number] DOB: 08-25-1940 DOA: 08/25/2018 PCP: Octavio Graves, DO (Inactive)  Brief History   78 year old woman PMH systolic, diastolic CHF, atrial fibrillation, CKD V followed by Dr. Joelyn Oms nephrology and Dr. Donzetta Matters [facility] for AV fistula placement presented with increasing shortness of breath, 20 pound weight gain.  A & P  Acute on chronic combined systolic, diastolic CHF with associated weight gain and dyspnea on exertion over the last several weeks, despite increased doses of Demadex.  Last LVEF 45-50% 2018. --No signs or symptoms to suggest ACS --Discussed with nephrology Dr. Noel Journey, he will order alternative diuretic --Patient also adamant to have her cardiology team involved, consultation placed  CKD stage V --Creatinine appears stable at this point.  Discussed with Dr. Carolin Sicks, he will direct diuretic management.  Patient is allergic to Lasix.  Chronic hypoxic respiratory failure --Appears stable.  Diabetes mellitus type 2 --Reportedly on sliding scale Lantus at home, unusual.  Glipizide on hold. --Blood sugars currently sub-100.  Will hold Lantus for now and continue sliding scale insulin.  Atrial flutter, paroxysmal atrial fibrillation --Stable.  Continue metoprolol, apixaban --Could can consider discontinuing Cardizem given AF, although this would need further investigation discussion with cardiology   Plan is for transfer to [facility], nephrology following, cardiology consulted.   DVT prophylaxis: apixaban Code Status: Full Family Communication: none, patient awake, alert, did not request Disposition Plan: transfer to [facility] already arranged    Murray Hodgkins, MD  [facility] Direct contact: see [email or web address] (further directions at bottom of note if needed) 7PM-7AM contact night coverage as at bottom of note 08/26/2018, 12:16 PM  LOS: 1 day   Consultants  . Cardiology . Nephrology  Procedures  .   Antibiotics  .    Interval History/Subjective  Feels okay today.  Breathing okay.  Some improvement in swelling of legs.  Still has a lot of abdominal wall edema.  Objective   Vitals:  Vitals:   08/26/18 0845 08/26/18 0900  BP:    Pulse: 89   Resp: 19 (!) 21  Temp:    SpO2: 100%     Exam:  Constitutional:  . Appears calm and comfortable Respiratory:  . CTA bilaterally, no w/r/r.  . Respiratory effort normal.  Cardiovascular:  . RRR, no m/r/g . Minimal bilateral LE extremity edema   . Significant abdominal wall edema Abdomen:  . Soft, nontender, there is significant abdominal wall edema and groin edema Psychiatric:  . Mental status o Mood, affect appropriate  I have personally reviewed the following:   Today's Data  . No urine output recorded . Creatinine stable at 3.38.  Potassium 4.0.  Remainder CMP unremarkable. . Hemoglobin stable 10.2.  Lab Data  . Episode of hypoglycemia noted last evening . SARS-CoV-2 negative  Micro Data  .   Imaging  . Chest x-ray showed mild pulmonary edema  Cardiology Data  . EKG showed atrial fibrillation, no acute changes  Other Data  .   Scheduled Meds: . amiodarone  100 mg Oral Daily  . apixaban  5 mg Oral BID  . diltiazem  120 mg Oral Daily  . estradiol  1 mg Oral Daily  . famotidine  20 mg Oral Daily  . insulin aspart  0-9 Units Subcutaneous TID WC  . insulin glargine  5-35 Units Subcutaneous BID  . levothyroxine  137 mcg Oral QAC breakfast  . magnesium oxide  200 mg Oral QODAY  . metoprolol tartrate  12.5 mg Oral BID  .  sodium chloride flush  3 mL Intravenous Q12H  . torsemide  40 mg Oral Daily   Continuous Infusions: . sodium chloride      Principal Problem:   DOE (dyspnea on exertion) Active Problems:   Obesity, Class III, BMI 40-49.9 (morbid obesity) (HCC)   DM neuropathy, type II diabetes mellitus (HCC)   CAD (coronary artery disease)   Acute on chronic diastolic ACC/AHA stage C congestive heart failure (HCC)   A-fib  (HCC)   Atrial fibrillation (HCC)   Chronic kidney disease (CKD), active medical management without dialysis, stage 5 ([place])   LOS: 1 day   How to contact the [facility] Attending or Consulting provider 7A - 7P or covering provider during after hours [place], for this patient?  1. Check the care team in [facility] and look for a) attending/consulting [facility] provider listed and b) the [facility] team listed 2. Log into [email or web address] and use 's universal password to access. If you do not have the password, please contact the hospital operator. 3. Locate the [facility] provider you are looking for under [facility] and page to a number that you can be directly reached. 4. If you still have difficulty reaching the provider, please page the [facility] (Director on Call) for the Hospitalists listed on amion for assistance.

## 2018-08-26 NOTE — ED Notes (Addendum)
Contact information Heinz Knuckles (daughter) [phone] Krystal Clark (son in law) [phone]

## 2018-08-26 NOTE — Progress Notes (Signed)
[name] called tonight wanting to find out the status of her mother. She is very upset that no doctor has called her regarding her mother's treatments.  I let [name] know that I will placed a note in the patient's file and to have a doctor call in the morning if possible.

## 2018-08-27 DIAGNOSIS — I4891 Unspecified atrial fibrillation: Secondary | ICD-10-CM

## 2018-08-27 DIAGNOSIS — I5043 Acute on chronic combined systolic (congestive) and diastolic (congestive) heart failure: Secondary | ICD-10-CM

## 2018-08-27 DIAGNOSIS — N189 Chronic kidney disease, unspecified: Secondary | ICD-10-CM

## 2018-08-27 LAB — RENAL FUNCTION PANEL
Albumin: 3.4 g/dL — ABNORMAL LOW (ref 3.5–5.0)
Anion gap: 13 (ref 5–15)
BUN: 41 mg/dL — ABNORMAL HIGH (ref 8–23)
CO2: 23 mmol/L (ref 22–32)
Calcium: 9.1 mg/dL (ref 8.9–10.3)
Chloride: 100 mmol/L (ref 98–111)
Creatinine, Ser: 3.75 mg/dL — ABNORMAL HIGH (ref 0.44–1.00)
GFR calc Af Amer: 13 mL/min — ABNORMAL LOW (ref >60)
GFR calc non Af Amer: 11 mL/min — ABNORMAL LOW (ref >60)
Glucose, Bld: 163 mg/dL — ABNORMAL HIGH (ref 70–99)
Phosphorus: 5.6 mg/dL — ABNORMAL HIGH (ref 2.5–4.6)
Potassium: 4.7 mmol/L (ref 3.5–5.1)
Sodium: 136 mmol/L (ref 135–145)

## 2018-08-27 LAB — IRON AND TIBC
Iron: 26 ug/dL — ABNORMAL LOW (ref 28–170)
Saturation Ratios: 6 % — ABNORMAL LOW (ref 10.4–31.8)
TIBC: 462 ug/dL — ABNORMAL HIGH (ref 250–450)
UIBC: 436 ug/dL

## 2018-08-27 LAB — GLUCOSE, CAPILLARY: Glucose-Capillary: 193 mg/dL — ABNORMAL HIGH (ref 70–99)

## 2018-08-27 LAB — FERRITIN: Ferritin: 12 ng/mL (ref 11–307)

## 2018-08-27 MED ORDER — METOLAZONE 5 MG PO TABS
5.0000 mg | ORAL_TABLET | Freq: Once | ORAL | Status: AC
  Administered 2018-08-27: 5 mg via ORAL
  Filled 2018-08-27: qty 1

## 2018-08-27 MED ORDER — DEXTROSE 5 % IV SOLN
4.0000 mg | Freq: Three times a day (TID) | INTRAVENOUS | Status: DC
  Administered 2018-08-27 – 2018-08-30 (×9): 4 mg via INTRAVENOUS
  Filled 2018-08-27 (×5): qty 16
  Filled 2018-08-27: qty 10
  Filled 2018-08-27 (×6): qty 16

## 2018-08-27 MED ORDER — POLYETHYLENE GLYCOL 3350 17 G PO PACK
17.0000 g | PACK | Freq: Every day | ORAL | Status: DC
  Administered 2018-08-27 – 2018-08-30 (×4): 17 g via ORAL
  Filled 2018-08-27 (×4): qty 1

## 2018-08-27 NOTE — Plan of Care (Signed)
Acute rehab goals established. 

## 2018-08-27 NOTE — Progress Notes (Signed)
[facility] will sign off.   Medication Recommendations:  As per consult note on 7/31. Diuretics per nephrology. Other recommendations (labs, testing, etc):  [facility] Follow up as an outpatient:  In atrial fibrillation clinic.

## 2018-08-27 NOTE — Progress Notes (Signed)
Patient ID: [name], female   DOB: 1940/08/18, 78 y.o.   MRN: [id number] [facility] Progress Note   Assessment/ Plan:   1. Acute kidney Injury on chronic kidney disease stage IV: Patient with solitary kidney and underlying chronic kidney disease from hypertension and likely cardiorenal syndrome.  Her acute renal insufficiency appears to be from CHF exacerbation/hemodynamic mechanism and likely will fluctuate with efforts at increased diuresis/volume unloading for symptomatic improvement.  She is status post first stage brachiobasilic fistula by Dr. Donzetta Matters on 08/02/2018.  At this time, we will continue efforts at titrating diuretics to improve volume unloading/fluid status by adding metolazone and increasing bumetanide dose to 4 mg IV 3 times daily. 2.  Acute exacerbation of congestive heart failure: Meager response to intravenous bumetanide overnight, uptitrate dose and add metolazone.  Seen earlier by cardiology who have signed off. 3.  Anemia: Likely secondary to chronic kidney disease, will send off iron studies. 4.  Secondary hyperparathyroidism: Calcium level within acceptable range, hyperphosphatemia noted-we will continue to trend this to decide need for binder. 5.  Chronic atrial fibrillation: Appears rate controlled on diltiazem/metoprolol/amiodarone and on anticoagulation with Eliquis.  Subjective:   Reports to be feeling distended in her abdomen and complains of constipation-awaiting MiraLAX.  Shortness of breath unchanged.   Objective:   BP 104/61 (BP Location: Right Arm)   Pulse 80   Temp 97.7 F (36.5 C) (Oral)   Resp 19   Ht 5\' 2"  (1.575 m)   Wt 118.3 kg   SpO2 100%   BMI 47.68 kg/m   Intake/Output Summary (Last 24 hours) at 08/27/2018 1004 Last data filed at 08/27/2018 0900 Gross per 24 hour  Intake 849 ml  Output 1225 ml  Net -376 ml   Weight change: 0.408 kg  Physical Exam: Gen: Comfortably sitting up on the side of her bed, on oxygen via nasal  cannula.  Speaks complete sentences CVS: Pulse irregularly irregular, normal rate, S1 and S2 normal Resp: Diminished breath sounds over bases, no distinct rales or rhonchi Abd: Soft, obese, nontender Ext: 1-2+ pitting lower extremity edema  Imaging: Dg Chest Port 1 View  Result Date: 08/25/2018 CLINICAL DATA:  Shortness of breath and mid chest pain for 3 weeks, weight gain, to start dialysis soon, history CHF, type II diabetes mellitus, hypertension, cancer of the RIGHT renal pelvis EXAM: PORTABLE CHEST 1 VIEW COMPARISON:  Portable exam 1643 hours compared to 03/16/2017 FINDINGS: Enlargement of cardiac silhouette with pulmonary vascular congestion. Atherosclerotic calcification aorta. Loop recorder projects over lower LEFT chest. Hazy pulmonary markings may reflect minimal pulmonary edema. No segmental consolidation, pleural effusion or pneumothorax. Bones unremarkable. IMPRESSION: Enlargement of cardiac silhouette with pulmonary vascular congestion and suspect minimal pulmonary edema. Electronically Signed   By: Lavonia Dana M.D.   On: 08/25/2018 17:07    Labs: BMET Recent Labs  Lab 08/25/18 1714 08/26/18 0350 08/27/18 0423  NA 138 140 136  K 3.9 4.0 4.7  CL 103 102 100  CO2 27 27 23   GLUCOSE 99 83 163*  BUN 34* 35* 41*  CREATININE 3.38* 3.38* 3.75*  CALCIUM 8.8* 8.9 9.1  PHOS  --   --  5.6*   CBC Recent Labs  Lab 08/25/18 1714 08/26/18 0350  WBC 8.3 8.9  NEUTROABS 5.3  --   HGB 10.8* 10.2*  HCT 37.6 36.0  MCV 91.3 91.1  PLT 269 277    Medications:    . amiodarone  200 mg Oral Daily  . apixaban  5 mg Oral BID  . bumetanide (BUMEX) IV  2 mg Intravenous Q8H  . diltiazem  120 mg Oral Daily  . estradiol  1 mg Oral Daily  . famotidine  20 mg Oral Daily  . insulin aspart  0-9 Units Subcutaneous TID WC  . levothyroxine  137 mcg Oral QAC breakfast  . magnesium oxide  200 mg Oral QODAY  . metolazone  5 mg Oral Once  . metoprolol tartrate  12.5 mg Oral BID  . polyethylene  glycol  17 g Oral Daily  . sodium chloride flush  3 mL Intravenous Q12H   Elmarie Shiley, MD 08/27/2018, 10:04 AM

## 2018-08-27 NOTE — Evaluation (Signed)
Physical Therapy Evaluation Patient Details Name: [name] MRN: [id number] DOB: [birth year] Today's Date: 08/27/2018   History of Present Illness  78 y.o. 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 Admitted for  Acute exacerbation of Combined Systolic/diastolic CHF .  Clinical Impression  Pt admitted with above diagnosis. Pt currently with functional limitations due to the deficits listed below (see PT Problem List). Min assist for gait due to mild instability at times while using RW for support. Dyspneic with SpO2 95% on room air during ambulatory bout. Anticipate she will continue to progress well. Pt will benefit from skilled PT to increase their independence and safety with mobility to allow discharge to the venue listed below.       Follow Up Recommendations Home health PT    Equipment Recommendations  None recommended by PT    Recommendations for Other Services OT consult     Precautions / Restrictions Precautions Precautions: None Restrictions Weight Bearing Restrictions: No      Mobility  Bed Mobility               General bed mobility comments: Sitting EOB when PT entered room  Transfers Overall transfer level: Needs assistance Equipment used: Rolling walker (2 wheeled) Transfers: Sit to/from Stand Sit to Stand: Min guard         General transfer comment: Min guard for safety. VC for hand placement.  Ambulation/Gait Ambulation/Gait assistance: Min assist Gait Distance (Feet): 35 Feet Assistive device: Rolling walker (2 wheeled) Gait Pattern/deviations: Step-through pattern;Decreased  stride length;Drifts right/left Gait velocity: slow Gait velocity interpretation: <1.31 ft/sec, indicative of household ambulator General Gait Details: Gait mildly unsteady, min assist for balance. Educated on safe DME use and proximity. Moderate dyspnea and quite fatigued by end of distance. One standing rest break. SpO2 95% on room air.   Stairs            Wheelchair Mobility    Modified Rankin (Stroke Patients Only)       Balance Overall balance assessment: Mild deficits observed, not formally tested                                           Pertinent Vitals/Pain Pain Assessment: No/denies pain    Home Living Family/patient expects to be discharged to:: Private residence Living Arrangements: Children Available Help at Discharge: Family;Available 24 hours/day Type of Home: House Home Access: Ramped entrance     Home Layout: One level Home Equipment: Walker - 4 wheels;Grab bars - tub/shower;Grab bars - toilet;Shower seat;Bedside commode;Cane - single point;Cane - quad;Wheelchair - manual      Prior Function Level of Independence: Needs assistance   Gait / Transfers Assistance Needed: rollator, denies falls  ADL's / Homemaking Assistance Needed: Family assist with shopping and putting groceries away. lives with autistic son who is able to help with some household tasks.  Comments: Uses      Hand Dominance   Dominant Hand: Right    Extremity/Trunk Assessment   Upper Extremity Assessment Upper Extremity Assessment: Defer to OT evaluation    Lower Extremity Assessment  Lower Extremity Assessment: Generalized weakness       Communication   Communication: HOH  Cognition Arousal/Alertness: Awake/alert Behavior During Therapy: WFL for tasks assessed/performed Overall Cognitive Status: Within Functional Limits for tasks assessed                                        General Comments General comments (skin integrity,  edema, etc.): SpO2 98% on room air, 95% with short distance walk.     Exercises     Assessment/Plan    PT Assessment Patient needs continued PT services  PT Problem List Decreased strength;Decreased activity tolerance;Decreased balance;Decreased mobility;Decreased knowledge of use of DME;Cardiopulmonary status limiting activity;Obesity       PT Treatment Interventions DME instruction;Gait training;Functional mobility training;Therapeutic activities;Therapeutic exercise;Balance training;Patient/family education;Neuromuscular re-education    PT Goals (Current goals can be found in the Care Plan section)  Acute Rehab PT Goals Patient Stated Goal: Get well PT Goal Formulation: With patient Time For Goal Achievement: 09/10/18 Potential to Achieve Goals: Good    Frequency Min 3X/week   Barriers to discharge        Co-evaluation               AM-PAC PT "6 Clicks" Mobility  Outcome Measure Help needed turning from your back to your side while in a flat bed without using bedrails?: A Little Help needed moving from lying on your back to sitting on the side of a flat bed without using bedrails?: A Little Help needed moving to and from a bed to a chair (including a wheelchair)?: A Little Help needed standing up from a chair using your arms (e.g., wheelchair or bedside chair)?: A Little Help needed to walk in hospital room?: A Little Help needed climbing 3-5 steps with a railing? : A Lot 6 Click Score: 17    End of Session Equipment Utilized During Treatment: Gait belt Activity Tolerance: Patient tolerated treatment well Patient left: in bed;with call bell/phone within reach   PT Visit Diagnosis: Unsteadiness on feet (R26.81);Muscle weakness (generalized) (M62.81);Difficulty in walking, not elsewhere classified (R26.2)    Time: 3086-5784 PT Time Calculation (min) (ACUTE ONLY): 22 min   Charges:   PT Evaluation $PT Eval Moderate Complexity: 1 Mod          Keera Altidor Rupert Stacks, PT, DPT  Ellouise Newer 08/27/2018, 3:44 PM

## 2018-08-27 NOTE — Progress Notes (Addendum)
PROGRESS NOTE    [name]  OBS:[id number]  DOB: [birth year]  DOA: 08/25/2018 PCP: Octavio Graves, DO (Inactive)  Brief Narrative:  78 y.o. 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 on 7/30 with dyspnea/intermittent palpitations/20 pound weight gain inspite of increasing outpatient Torsemide dose. BNP 344. Chest x-ray with cardiomegaly and mild pulmonary edema. She was seen by nephrology Dr Carolin Sicks and started on IV diuretics. She did have increase urine output after IV Bumex.  She was transferred to [facility] upon patient's request to be evaluated by her primary cardiologist.   Subjective:  Patient says she isn't diuresing much but feels better.Seen by cardiology Dr Angelena Form yesterday and her amiodarone dose was increased given c/o palpitations intermittently over last 3 weeks which could have precipitated CHF. She is able to talk full sentences without SOB. O2 [place] tapered from 4lits yesterday to 3lits today.   Objective: Vitals:   08/26/18 2300 08/26/18 2345 08/27/18 0559 08/27/18 0750  BP: 121/70 103/72 115/69 104/61  Pulse: 83 81 77 80  Resp:  17 19   Temp:  (!) 97.5 F (36.4 C) 97.6 F (36.4 C) 97.7 F (36.5 C)  TempSrc:  Oral Oral Oral  SpO2:  95% 92% 100%  Weight:   118.3 kg   Height:        Intake/Output Summary (Last 24 hours) at 08/27/2018 0910 Last data filed at 08/27/2018 0811 Gross per 24 hour  Intake 369 ml  Output 825 ml  Net -456 ml   Filed Weights   08/25/18 1629 08/26/18 1446 08/27/18 0559  Weight: 117.9 kg  118.3 kg 118.3 kg    Physical Examination:  General exam: Appears calm and comfortable  Respiratory system: Clear to auscultation. Respiratory effort normal. Cardiovascular system: S1 & S2 heard, irregular. No JVD, murmurs, rubs.+1 leg edema. Gastrointestinal system: Abdomen is obese ,nondistended, soft and nontender. No organomegaly or masses felt. Normal bowel sounds heard. Central nervous system: Alert and oriented. No focal neurological deficits. Extremities: Symmetric 5 x 5 power. Skin: No rashes, lesions or ulcers Psychiatry: Judgement and insight appear normal. Mood & affect appropriate.     Data Reviewed: I have personally reviewed following labs and imaging studies  CBC: Recent Labs  Lab 08/25/18 1714 08/26/18 0350  WBC 8.3 8.9  NEUTROABS 5.3  --   HGB 10.8* 10.2*  HCT 37.6 36.0  MCV 91.3 91.1  PLT 269 476   Basic Metabolic Panel: Recent Labs  Lab 08/25/18 1714 08/26/18 0350 08/27/18 0423  NA 138 140 136  K 3.9 4.0 4.7  CL 103 102 100  CO2 27 27 23   GLUCOSE 99 83 163*  BUN 34* 35* 41*  CREATININE 3.38* 3.38* 3.75*  CALCIUM 8.8* 8.9 9.1  PHOS  --   --  5.6*   GFR: Estimated Creatinine Clearance: 15.8 mL/min (A) (by C-G formula based on SCr of 3.75 mg/dL (H)). Liver Function Tests: Recent Labs  Lab 08/25/18 1714 08/26/18 0350 08/27/18 0423  AST 20 18  --   ALT 14 13  --   ALKPHOS 69 64  --   BILITOT 0.6 0.6  --  PROT 6.7 6.7  --   ALBUMIN 3.4* 3.4* 3.4*   No results for input(s): LIPASE, AMYLASE in the last 168 hours. No results for input(s): AMMONIA in the last 168 hours. Coagulation Profile: No results for input(s): INR, PROTIME in the last 168 hours. Cardiac Enzymes: No results for input(s): CKTOTAL, CKMB, CKMBINDEX, TROPONINI in the last 168 hours. BNP (last 3 results) No results for input(s): PROBNP in the last 8760 hours. HbA1C: Recent Labs    08/25/18 1714  HGBA1C 6.9*   CBG: Recent Labs  Lab 08/26/18 0019 08/26/18 0830  08/26/18 1253 08/26/18 1524 08/26/18 2116  GLUCAP 97 72 78 125* 121*   Lipid Profile: No results for input(s): CHOL, HDL, LDLCALC, TRIG, CHOLHDL, LDLDIRECT in the last 72 hours. Thyroid Function Tests: Recent Labs    08/26/18 1659  TSH 1.933   Anemia Panel: No results for input(s): VITAMINB12, FOLATE, FERRITIN, TIBC, IRON, RETICCTPCT in the last 72 hours. Sepsis Labs: No results for input(s): PROCALCITON, LATICACIDVEN in the last 168 hours.  Recent Results (from the past 240 hour(s))  SARS Coronavirus 2 (CEPHEID - Performed in [place] hospital lab), Hosp Order     Status: None   Collection Time: 08/25/18  9:25 PM   Specimen: Nasopharyngeal Swab  Result Value Ref Range Status   SARS Coronavirus 2 NEGATIVE NEGATIVE Final    Comment: (NOTE) If result is NEGATIVE SARS-CoV-2 target nucleic acids are NOT DETECTED. The SARS-CoV-2 RNA is generally detectable in upper and lower  respiratory specimens during the acute phase of infection. The lowest  concentration of SARS-CoV-2 viral copies this assay can detect is 250  copies / mL. A negative result does not preclude SARS-CoV-2 infection  and should not be used as the sole basis for treatment or other  patient management decisions.  A negative result may occur with  improper specimen collection / handling, submission of specimen other  than nasopharyngeal swab, presence of viral mutation(s) within the  areas targeted by this assay, and inadequate number of viral copies  (<250 copies / mL). A negative result must be combined with clinical  observations, patient history, and epidemiological information. If result is POSITIVE SARS-CoV-2 target nucleic acids are DETECTED. The SARS-CoV-2 RNA is generally detectable in upper and lower  respiratory specimens dur ing the acute phase of infection.  Positive  results are indicative of active infection with SARS-CoV-2.  Clinical  correlation with patient history and other diagnostic  information is  necessary to determine patient infection status.  Positive results do  not rule out bacterial infection or co-infection with other viruses. If result is PRESUMPTIVE POSTIVE SARS-CoV-2 nucleic acids MAY BE PRESENT.   A presumptive positive result was obtained on the submitted specimen  and confirmed on repeat testing.  While 2019 novel coronavirus  (SARS-CoV-2) nucleic acids may be present in the submitted sample  additional confirmatory testing may be necessary for epidemiological  and / or clinical management purposes  to differentiate between  SARS-CoV-2 and other Sarbecovirus currently known to infect humans.  If clinically indicated additional testing with an alternate test  methodology [phone]) is advised. The SARS-CoV-2 RNA is generally  detectable in upper and lower respiratory sp ecimens during the acute  phase of infection. The expected result is Negative. Fact Sheet for Patients:  StrictlyIdeas.no Fact Sheet for Healthcare Providers: BankingDealers.co.za This test is not yet approved or cleared by the [place] FDA and has been authorized for detection and/or diagnosis of SARS-CoV-2 by FDA under an  Emergency Use Authorization (EUA).  This EUA will remain in effect (meaning this test can be used) for the duration of the COVID-19 declaration under Section 564(b)(1) of the Act, 21 U.S.C. section 360bbb-3(b)(1), unless the authorization is terminated or revoked sooner. Performed at [facility], [address], [place], [place] [address]       Radiology Studies: Dg Chest [facility] 1 View  Result Date: 08/25/2018 CLINICAL DATA:  Shortness of breath and mid chest pain for 3 weeks, weight gain, to start dialysis soon, history CHF, type II diabetes mellitus, hypertension, cancer of the RIGHT renal pelvis EXAM: PORTABLE CHEST 1 VIEW COMPARISON:  Portable exam 1643 hours compared to 03/16/2017 FINDINGS: Enlargement of  cardiac silhouette with pulmonary vascular congestion. Atherosclerotic calcification aorta. Loop recorder projects over lower LEFT chest. Hazy pulmonary markings may reflect minimal pulmonary edema. No segmental consolidation, pleural effusion or pneumothorax. Bones unremarkable. IMPRESSION: Enlargement of cardiac silhouette with pulmonary vascular congestion and suspect minimal pulmonary edema. Electronically Signed   By: Lavonia Dana M.D.   On: 08/25/2018 17:07        Scheduled Meds: . amiodarone  200 mg Oral Daily  . apixaban  5 mg Oral BID  . bumetanide (BUMEX) IV  2 mg Intravenous Q8H  . diltiazem  120 mg Oral Daily  . estradiol  1 mg Oral Daily  . famotidine  20 mg Oral Daily  . insulin aspart  0-9 Units Subcutaneous TID WC  . levothyroxine  137 mcg Oral QAC breakfast  . magnesium oxide  200 mg Oral QODAY  . metoprolol tartrate  12.5 mg Oral BID  . sodium chloride flush  3 mL Intravenous Q12H   Continuous Infusions: . sodium chloride      Assessment & Plan:    1. Acute exacerbation of Combined Systolic/diastolic CHF : On IV diuresis per nephrology. O2 close to baseline requirement. Monitor daily weights. I/Os. Can likely be transitioned to oral diuretic in next 48 hours. Seen by  Cardiology and increased amio dose. Nephrology increased IV bumex to TID and added metolazone.  Last echo in 2018 showed EF 45-50% . Will repeat  2. A.flutter/ paroxysmal A.fib: S/P ablation in 2016 with recurrence, now on eliquis and amiodarone dose increased by cardiology given c/o palpitations which could indicate indicate intermittent RVR precipitating #1. Continue diltiazem long-acting 120 mg daily, metoprolol 12.5 mg twice daily . HR in 80s now  3. CAD-No angina currently but has h/o chronic angina.  Not on aspirin due to need of anticoagulation. -Continue beta-blocker  4.  Chronic kidney disease stage V with RCC, s/p right nephrectomy: Monitor on diuretics, being followed by nephrology  5.  Hypothyroidism: Synthroid  6. DM -2: on SSI. HgbA1c 6.9. ?Uses Lantus titration doses at home . Glipizide on hold   DVT prophylaxis: on anticoagulation Code Status: full code Family / Patient Communication: d/w patient and updated daughter [name] [phone]) who insists on being called every time a doctor sees her as she claims patient doesn't understand details of medication changes although patient seems coherent and asking the right questions. Disposition Plan: home when cleared by renal.  Daughter also concerned about ambulation and home walker upgrades. PT eval requested     LOS: 2 days    Time spent: 35 minutes    Guilford Shi, MD [facility] Pager [phone]  If 7PM-7AM, please contact night-coverage [email or web address] Password [email or web address] 08/27/2018, 9:10 AM

## 2018-08-28 ENCOUNTER — Inpatient Hospital Stay (HOSPITAL_COMMUNITY): Payer: Medicare HMO

## 2018-08-28 LAB — RENAL FUNCTION PANEL
Albumin: 3.2 g/dL — ABNORMAL LOW (ref 3.5–5.0)
Anion gap: 13 (ref 5–15)
BUN: 50 mg/dL — ABNORMAL HIGH (ref 8–23)
CO2: 24 mmol/L (ref 22–32)
Calcium: 9.2 mg/dL (ref 8.9–10.3)
Chloride: 99 mmol/L (ref 98–111)
Creatinine, Ser: 3.58 mg/dL — ABNORMAL HIGH (ref 0.44–1.00)
GFR calc Af Amer: 14 mL/min — ABNORMAL LOW (ref >60)
GFR calc non Af Amer: 12 mL/min — ABNORMAL LOW (ref >60)
Glucose, Bld: 173 mg/dL — ABNORMAL HIGH (ref 70–99)
Phosphorus: 5.2 mg/dL — ABNORMAL HIGH (ref 2.5–4.6)
Potassium: 3.9 mmol/L (ref 3.5–5.1)
Sodium: 136 mmol/L (ref 135–145)

## 2018-08-28 LAB — GLUCOSE, CAPILLARY
Glucose-Capillary: 197 mg/dL — ABNORMAL HIGH (ref 70–99)
Glucose-Capillary: 155 mg/dL — ABNORMAL HIGH (ref 70–99)
Glucose-Capillary: 161 mg/dL — ABNORMAL HIGH (ref 70–99)
Glucose-Capillary: 146 mg/dL — ABNORMAL HIGH (ref 70–99)

## 2018-08-28 MED ORDER — DIPHENHYDRAMINE HCL 25 MG PO CAPS
25.0000 mg | ORAL_CAPSULE | Freq: Once | ORAL | Status: AC
  Administered 2018-08-28: 25 mg via ORAL
  Filled 2018-08-28: qty 1

## 2018-08-28 MED ORDER — SODIUM CHLORIDE 0.9 % IV SOLN
510.0000 mg | INTRAVENOUS | Status: DC
  Administered 2018-08-28: 510 mg via INTRAVENOUS
  Filled 2018-08-28 (×2): qty 17

## 2018-08-28 MED ORDER — METOLAZONE 5 MG PO TABS
5.0000 mg | ORAL_TABLET | Freq: Two times a day (BID) | ORAL | Status: DC
  Administered 2018-08-28 – 2018-08-30 (×5): 5 mg via ORAL
  Filled 2018-08-28 (×5): qty 1

## 2018-08-28 MED ORDER — ALUM & MAG HYDROXIDE-SIMETH 200-200-20 MG/5ML PO SUSP: 30.0000 mL | Freq: Four times a day (QID) | ORAL | Status: DC | PRN

## 2018-08-28 NOTE — Plan of Care (Signed)
  Problem: Clinical Measurements: Goal: Ability to maintain clinical measurements within normal limits will improve Outcome: Progressing Goal: Will remain free from infection Outcome: Progressing Goal: Respiratory complications will improve Outcome: Progressing   Problem: Activity: Goal: Risk for activity intolerance will decrease Outcome: Progressing   Problem: Coping: Goal: Level of anxiety will decrease Outcome: Progressing   Problem: Pain Managment: Goal: General experience of comfort will improve Outcome: Progressing   Problem: Safety: Goal: Ability to remain free from injury will improve Outcome: Progressing   Problem: Skin Integrity: Goal: Risk for impaired skin integrity will decrease Outcome: Progressing

## 2018-08-28 NOTE — Progress Notes (Signed)
Pt started c/o sudden severe sharp pain in lower back and both sides after feraheme was started. States it feels like something was attacking her kidney. Nephrologist Dr. Posey Pronto made aware.  Received order for tylenol, benadryl and [place] of abdomen for potential allergic reaction.  Idolina Primer, RN

## 2018-08-28 NOTE — Progress Notes (Signed)
Patient ID: [name], female   DOB: 1940-01-28, 78 y.o.   MRN: [id number] [facility] Progress Note   Assessment/ Plan:   1. Acute kidney Injury on chronic kidney disease stage IV: Patient with solitary kidney and underlying chronic kidney disease from hypertension and likely cardiorenal syndrome.  Her acute renal insufficiency appears to be from CHF exacerbation/hemodynamic mechanism and likely will fluctuate with efforts at increased diuresis/volume unloading for symptomatic improvement.  She is status post first stage brachiobasilic fistula by Dr. Donzetta Matters on 08/02/2018.  Creatinine slightly better with unimpressive urine output/weight change overnight but symptomatic improvement noted by patient.  Will increase metolazone to 5 mg twice daily along with current dose of bumetanide.  If she fails to have decent urine output overnight, will need to consider initiation of dialysis for volume management. 2.  Acute exacerbation of congestive heart failure: Unimpressive response to diuresis overnight-increase metolazone to 5 mg daily and continue bumetanide 4 mg 3 times daily.  Symptomatically better. 3.  Anemia: Likely secondary to chronic kidney disease, severely iron deficient, will give intravenous iron. 4.  Secondary hyperparathyroidism: Calcium level within acceptable range, hyperphosphatemia noted to be trending down. 5.  Chronic atrial fibrillation: Appears rate controlled on diltiazem/metoprolol/amiodarone and on anticoagulation with Eliquis.  Subjective:   Reports improvement in her shortness of breath but still having problems with constipation.   Objective:   BP 115/64 (BP Location: Right Arm)   Pulse 70   Temp 97.6 F (36.4 C) (Oral)   Resp 20   Ht 5\' 2"  (1.575 m)   Wt 118.5 kg   SpO2 100%   BMI 47.79 kg/m   Intake/Output Summary (Last 24 hours) at 08/28/2018 0936 Last data filed at 08/28/2018 0913 Gross per 24 hour  Intake 615 ml  Output -  Net 615 ml   Weight  change: -0.091 kg  Physical Exam: Gen: Comfortably sitting up on the side of her bed, oxygen via nasal cannula.  Speaks complete sentences CVS: Pulse irregularly irregular, normal rate, S1 and S2 normal Resp: Diminished breath sounds over bases, no distinct rales or rhonchi Abd: Soft, obese, nontender Ext: 1-2+ pitting lower extremity edema  Imaging: No results found.  Labs: BMET Recent Labs  Lab 08/25/18 1714 08/26/18 0350 08/27/18 0423 08/28/18 0423  NA 138 140 136 136  K 3.9 4.0 4.7 3.9  CL 103 102 100 99  CO2 27 27 23 24   GLUCOSE 99 83 163* 173*  BUN 34* 35* 41* 50*  CREATININE 3.38* 3.38* 3.75* 3.58*  CALCIUM 8.8* 8.9 9.1 9.2  PHOS  --   --  5.6* 5.2*   CBC Recent Labs  Lab 08/25/18 1714 08/26/18 0350  WBC 8.3 8.9  NEUTROABS 5.3  --   HGB 10.8* 10.2*  HCT 37.6 36.0  MCV 91.3 91.1  PLT 269 277    Medications:    . amiodarone  200 mg Oral Daily  . apixaban  5 mg Oral BID  . diltiazem  120 mg Oral Daily  . estradiol  1 mg Oral Daily  . famotidine  20 mg Oral Daily  . insulin aspart  0-9 Units Subcutaneous TID WC  . levothyroxine  137 mcg Oral QAC breakfast  . magnesium oxide  200 mg Oral QODAY  . metolazone  5 mg Oral BID  . metoprolol tartrate  12.5 mg Oral BID  . polyethylene glycol  17 g Oral Daily  . sodium chloride flush  3 mL Intravenous Q12H   Ulice Dash  Posey Pronto, MD 08/28/2018, 9:36 AM

## 2018-08-28 NOTE — Progress Notes (Signed)
PROGRESS NOTE    [name]  IWP:[id number]  DOB: [birth year]  DOA: 08/25/2018 PCP: Octavio Graves, DO (Inactive)  Brief Narrative:  78 y.o. 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 BNP 344. Chest x-ray with cardiomegaly and mild pulmonary edema. She was seen by nephrology Dr Carolin Sicks and started on IV diuretics. She did have increase urine output after IV Bumex.  She was transferred to [facility] upon patient's request to be evaluated by her primary cardiologist.  08/28/2018: Patient seen.  Available records reviewed.  Patient still feels significantly volume overloaded.  Input from cardiology and nephrology is appreciated.  IV Feraheme discontinued today due to report of back pain.  Nephrology is managing.  No chest pain.  No other reported constitutional symptoms.  Subjective: Still feels volume overloaded. No shortness of breath No chest pain  Objective: Vitals:   08/28/18 0431 08/28/18 0743 08/28/18 0803 08/28/18 1312  BP: (!) 104/54  115/64 104/64  Pulse: 82  70 76  Resp: 20     Temp: (!) 97.5 F (36.4 C)  97.6 F (36.4 C) (!) 97.4 F (36.3 C)  TempSrc: Oral  Oral Oral  SpO2: 94%  100% 99%  Weight: 118.3 kg 118.5 kg    Height:        Intake/Output Summary (Last 24 hours) at 08/28/2018 1503 Last data filed at 08/28/2018 1050 Gross per 24 hour  Intake 981 ml  Output 900 ml  Net 81 ml   Filed Weights   08/27/18 0559 08/28/18 0431 08/28/18 0743  Weight: 118.3 kg 118.3 kg 118.5 kg    Physical Examination:  General exam: Patient is morbidly obese.  Patient is not in any obvious distress. Respiratory system: Clear to auscultation, but decreased.  Cardiovascular system: S1 & S2 heard. Gastrointestinal system: Abdomen is morbidly obese, soft and nontender.  Organs are difficult to assess.   Central nervous system: Awake, alert and oriented.  Patient moves all extremities.   Extremities: 1+ bilateral lower extremity edema.   Data Reviewed: I have personally reviewed following labs and imaging studies  CBC: Recent Labs  Lab 08/25/18 1714 08/26/18 0350  WBC 8.3 8.9  NEUTROABS 5.3  --   HGB 10.8* 10.2*  HCT 37.6 36.0  MCV 91.3 91.1  PLT 269 505   Basic Metabolic Panel: Recent Labs  Lab 08/25/18 1714 08/26/18 0350 08/27/18 0423 08/28/18 0423  NA 138 140 136 136  K 3.9 4.0 4.7 3.9  CL 103 102 100 99  CO2 27 27 23 24   GLUCOSE 99 83 163* 173*  BUN 34* 35* 41* 50*  CREATININE 3.38* 3.38* 3.75* 3.58*  CALCIUM 8.8* 8.9 9.1 9.2  PHOS  --   --  5.6* 5.2*   GFR: Estimated Creatinine Clearance: 16.6 mL/min (A) (by C-G formula based on SCr of 3.58 mg/dL (H)). Liver Function Tests: Recent Labs  Lab 08/25/18 1714 08/26/18 0350 08/27/18 0423 08/28/18 0423  AST 20 18  --   --   ALT 14 13  --   --  ALKPHOS 69 64  --   --   BILITOT 0.6 0.6  --   --   PROT 6.7 6.7  --   --   ALBUMIN 3.4* 3.4* 3.4* 3.2*   No results for input(s): LIPASE, AMYLASE in the last 168 hours. No results for input(s): AMMONIA in the last 168 hours. Coagulation Profile: No results for input(s): INR, PROTIME in the last 168 hours. Cardiac Enzymes: No results for input(s): CKTOTAL, CKMB, CKMBINDEX, TROPONINI in the last 168 hours. BNP (last 3 results) No results for input(s): PROBNP in the last 8760 hours. HbA1C: Recent Labs    08/25/18 1714  HGBA1C 6.9*   CBG: Recent Labs  Lab 08/26/18 1524  08/26/18 2116 08/27/18 2126 08/28/18 0800 08/28/18 1212  GLUCAP 125* 121* 193* 197* 155*   Lipid Profile: No results for input(s): CHOL, HDL, LDLCALC, TRIG, CHOLHDL, LDLDIRECT in the last 72 hours. Thyroid Function Tests: Recent Labs    08/26/18 1659  TSH 1.933   Anemia Panel: Recent Labs    08/27/18 1110  FERRITIN 12  TIBC 462*  IRON 26*   Sepsis Labs: No results for input(s): PROCALCITON, LATICACIDVEN in the last 168 hours.  Recent Results (from the past 240 hour(s))  SARS Coronavirus 2 (CEPHEID - Performed in [place] hospital lab), Hosp Order     Status: None   Collection Time: 08/25/18  9:25 PM   Specimen: Nasopharyngeal Swab  Result Value Ref Range Status   SARS Coronavirus 2 NEGATIVE NEGATIVE Final    Comment: (NOTE) If result is NEGATIVE SARS-CoV-2 target nucleic acids are NOT DETECTED. The SARS-CoV-2 RNA is generally detectable in upper and lower  respiratory specimens during the acute phase of infection. The lowest  concentration of SARS-CoV-2 viral copies this assay can detect is 250  copies / mL. A negative result does not preclude SARS-CoV-2 infection  and should not be used as the sole basis for treatment or other  patient management decisions.  A negative result may occur with  improper specimen collection / handling, submission of specimen other  than nasopharyngeal swab, presence of viral mutation(s) within the  areas targeted by this assay, and inadequate number of viral copies  (<250 copies / mL). A negative result must be combined with clinical  observations, patient history, and epidemiological information. If result is POSITIVE SARS-CoV-2 target nucleic acids are DETECTED. The SARS-CoV-2 RNA is generally detectable in upper and lower  respiratory specimens dur ing the acute phase of infection.  Positive  results are indicative of active infection with SARS-CoV-2.  Clinical  correlation with patient history and other diagnostic information  is  necessary to determine patient infection status.  Positive results do  not rule out bacterial infection or co-infection with other viruses. If result is PRESUMPTIVE POSTIVE SARS-CoV-2 nucleic acids MAY BE PRESENT.   A presumptive positive result was obtained on the submitted specimen  and confirmed on repeat testing.  While 2019 novel coronavirus  (SARS-CoV-2) nucleic acids may be present in the submitted sample  additional confirmatory testing may be necessary for epidemiological  and / or clinical management purposes  to differentiate between  SARS-CoV-2 and other Sarbecovirus currently known to infect humans.  If clinically indicated additional testing with an alternate test  methodology [phone]) is advised. The SARS-CoV-2 RNA is generally  detectable in upper and lower respiratory sp ecimens during the acute  phase of infection. The expected result is Negative. Fact Sheet for Patients:  StrictlyIdeas.no Fact Sheet for Healthcare Providers: BankingDealers.co.za  This test is not yet approved or cleared by the [place] and has been authorized for detection and/or diagnosis of SARS-CoV-2 by FDA under an Emergency Use Authorization (EUA).  This EUA will remain in effect (meaning this test can be used) for the duration of the COVID-19 declaration under Section 564(b)(1) of the Act, 21 U.S.C. section 360bbb-3(b)(1), unless the authorization is terminated or revoked sooner. Performed at [facility], [address], [place], [place] [address]       Radiology Studies: US Renal  Result Date: 08/28/2018 CLINICAL DATA:  Acute renal failure EXAM: RENAL / URINARY TRACT ULTRASOUND COMPLETE COMPARISON:  CT abdomen pelvis 03/16/2017 FINDINGS: Right Kidney: Surgically absent Left Kidney: Renal measurements: 10.6 x 4.5 x 5.8 cm = volume: 144.2 mL. Renal cortical thinning. Increased renal cortical echogenicity. No hydronephrosis. Bladder:  Not visualized IMPRESSION: No hydronephrosis. Findings suggestive of chronic renal disease involving the left kidney. Electronically Signed   By: Lovey Newcomer M.D.   On: 08/28/2018 11:55        Scheduled Meds: . amiodarone  200 mg Oral Daily  . apixaban  5 mg Oral BID  . diltiazem  120 mg Oral Daily  . estradiol  1 mg Oral Daily  . famotidine  20 mg Oral Daily  . insulin aspart  0-9 Units Subcutaneous TID WC  . levothyroxine  137 mcg Oral QAC breakfast  . magnesium oxide  200 mg Oral QODAY  . metolazone  5 mg Oral BID  . metoprolol tartrate  12.5 mg Oral BID  . polyethylene glycol  17 g Oral Daily  . sodium chloride flush  3 mL Intravenous Q12H   Continuous Infusions: . sodium chloride    . bumetanide (BUMEX) IV 4 mg (08/28/18 1356)  . ferumoxytol Stopped (08/28/18 1050)    Assessment & Plan:    1. Acute exacerbation of Combined Systolic/diastolic CHF/volume overload:  On IV diuresis per nephrology. O2 close to baseline requirement. Monitor daily weights. I/Os. Can likely be transitioned to oral diuretic in next 48 hours. Seen by  Cardiology and increased amio dose. Nephrology increased IV bumex to TID and added metolazone.  Last echo in 2018 showed EF 45-50% . Will repeat -08/28/2018: Continue IV Bumex and metolazone.  Continue to monitor I's and O's.  Last echocardiogram done was on 11/22/2016, an EF of 45 to 50% was documented.  Query value in repeating echocardiogram.  2. A.flutter/ paroxysmal A.fib:  S/P ablation in 2016 with recurrence, now on eliquis and amiodarone dose increased by cardiology given c/o palpitations which could indicate indicate intermittent RVR precipitating #1. Continue diltiazem long-acting 120 mg daily, metoprolol 12.5 mg twice daily . HR in 80s now  3. CAD:  Stable.   -Continue beta-blocker  4.  Chronic kidney disease stage V, with volume overload:  Patient is currently on IV Bumex 5 mg every 8 hourly and oral metolazone 5 Mg p.o. twice daily.    Continue to monitor I's and O's.   History of renal cell carcinoma status post right nephrectomy.   Patient is currently volume overloaded, not very responsive to diuretics.   Creation of vascular abscess for possible renal replacement therapy is currently in progress.   Nephrology input is appreciated.   Anemia of CKD, but hemoglobin is at goal.  5. Hypothyroidism:  Synthroid  6. 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   DVT prophylaxis: on apixaban 5 Mg p.o. twice daily  Code Status: full code Family / Patient Communication: d Disposition Plan: This  will depend on hospital course   LOS: 3 days    Time spent: 35 minutes    Bonnell Public, MD [facility] Pager [phone].    If 7PM-7AM, please contact night-coverage [email or web address] Password [email or web address] 08/28/2018, 3:03 PM

## 2018-08-28 NOTE — Progress Notes (Signed)
Patient ID: [name], female   DOB: 05-Apr-1940, 78 y.o.   MRN: [id number] I received a page from the patient's nurse regarding acute/sharp bilateral flank pain following initiation of Feraheme infusion.  This indeed would be a very atypical "allergic reaction" if at all any.  Infusion stopped, treat with Tylenol and diphenhydramine.  Obtain renal ultrasound as she has a solitary kidney.  The patient has an extensive medication allergy/intolerance list noted.  Elmarie Shiley MD [facility]. Office # [phone] Pager # [phone] 11:08 AM

## 2018-08-29 ENCOUNTER — Other Ambulatory Visit: Payer: Self-pay

## 2018-08-29 LAB — RENAL FUNCTION PANEL
Albumin: 3.1 g/dL — ABNORMAL LOW (ref 3.5–5.0)
Anion gap: 13 (ref 5–15)
BUN: 56 mg/dL — ABNORMAL HIGH (ref 8–23)
CO2: 27 mmol/L (ref 22–32)
Calcium: 9.2 mg/dL (ref 8.9–10.3)
Chloride: 94 mmol/L — ABNORMAL LOW (ref 98–111)
Creatinine, Ser: 4.07 mg/dL — ABNORMAL HIGH (ref 0.44–1.00)
GFR calc Af Amer: 12 mL/min — ABNORMAL LOW (ref >60)
GFR calc non Af Amer: 10 mL/min — ABNORMAL LOW (ref >60)
Glucose, Bld: 160 mg/dL — ABNORMAL HIGH (ref 70–99)
Phosphorus: 5 mg/dL — ABNORMAL HIGH (ref 2.5–4.6)
Potassium: 3.3 mmol/L — ABNORMAL LOW (ref 3.5–5.1)
Sodium: 134 mmol/L — ABNORMAL LOW (ref 135–145)

## 2018-08-29 LAB — GLUCOSE, CAPILLARY
Glucose-Capillary: 201 mg/dL — ABNORMAL HIGH (ref 70–99)
Glucose-Capillary: 110 mg/dL — ABNORMAL HIGH (ref 70–99)
Glucose-Capillary: 170 mg/dL — ABNORMAL HIGH (ref 70–99)
Glucose-Capillary: 206 mg/dL — ABNORMAL HIGH (ref 70–99)

## 2018-08-29 MED ORDER — POTASSIUM CHLORIDE CRYS ER 20 MEQ PO TBCR
40.0000 meq | EXTENDED_RELEASE_TABLET | Freq: Once | ORAL | Status: AC
  Administered 2018-08-29: 40 meq via ORAL
  Filled 2018-08-29: qty 2

## 2018-08-29 NOTE — Progress Notes (Signed)
Patient ID: [name], female   DOB: 1940/10/24, 78 y.o.   MRN: [id number] [facility] Progress Note   Assessment/ Plan:   1. Acute kidney Injury on chronic kidney disease stage IV: Patient with solitary kidney and underlying chronic kidney disease from hypertension and likely cardiorenal syndrome.  Her acute renal insufficiency appears to be from CHF exacerbation/hemodynamic mechanism and likely will fluctuate with efforts at increased diuresis/volume unloading for symptomatic improvement.  She is status post first stage brachiobasilic fistula by Dr. Donzetta Matters on 08/02/2018.  At this time, we will continue efforts at titrating diuretics to improve volume unloading/fluid status by adding metolazone and increasing bumetanide dose to 4 mg IV 3 times daily. Had good response but crt bumped.  No acute indication for HD but may get there this week 2.  Acute exacerbation of congestive heart failure: Meager response to intravenous bumetanide overnight, uptitrate dose and add metolazone.  Seen earlier by cardiology who have signed off. Good response 3.  Anemia: Likely secondary to chronic kidney disease, will send off iron studies. Had "reaction " to IV iron-  hgb over 10- no ESA yet 4.  Secondary hyperparathyroidism: Calcium level within acceptable range, hyperphosphatemia noted-we will continue to trend this to decide need for binder. 5.  Chronic atrial fibrillation: Appears rate controlled on diltiazem/metoprolol/amiodarone and on anticoagulation with Eliquis. 6. Hypokalemia- give dose today   Subjective:   "I never feel good"  Nauseated - still swollen.  Almost 4 liters of UOP which is great ut crt now over 4   "I waited too long"  Reported flan pain but renal u/s neg for stone or hydro.  Tearful when hears that kidney function worse    Objective:   BP 113/64   Pulse 78   Temp 97.6 F (36.4 C) (Oral)   Resp 16   Ht 5\' 2"  (1.575 m)   Wt 116.5 kg   SpO2 97%   BMI 46.97 kg/m    Intake/Output Summary (Last 24 hours) at 08/29/2018 0953 Last data filed at 08/29/2018 0945 Gross per 24 hour  Intake 914.15 ml  Output 4150 ml  Net -3235.85 ml   Weight change: 0.272 kg  Physical Exam: Gen: Comfortably sitting up on the side of her bed, on oxygen via nasal cannula.  Speaks complete sentences- morbidly obese CVS: Pulse irregularly irregular, normal rate, S1 and S2 normal Resp: Diminished breath sounds over bases, no distinct rales or rhonchi Abd: Soft, obese, nontender Ext: 1-2+ pitting lower extremity edema Left upper arm AVF with thrill  Imaging: US Renal  Result Date: 08/28/2018 CLINICAL DATA:  Acute renal failure EXAM: RENAL / URINARY TRACT ULTRASOUND COMPLETE COMPARISON:  CT abdomen pelvis 03/16/2017 FINDINGS: Right Kidney: Surgically absent Left Kidney: Renal measurements: 10.6 x 4.5 x 5.8 cm = volume: 144.2 mL. Renal cortical thinning. Increased renal cortical echogenicity. No hydronephrosis. Bladder: Not visualized IMPRESSION: No hydronephrosis. Findings suggestive of chronic renal disease involving the left kidney. Electronically Signed   By: Lovey Newcomer M.D.   On: 08/28/2018 11:55    Labs: BMET Recent Labs  Lab 08/25/18 1714 08/26/18 0350 08/27/18 0423 08/28/18 0423 08/29/18 0418  NA 138 140 136 136 134*  K 3.9 4.0 4.7 3.9 3.3*  CL 103 102 100 99 94*  CO2 27 27 23 24 27   GLUCOSE 99 83 163* 173* 160*  BUN 34* 35* 41* 50* 56*  CREATININE 3.38* 3.38* 3.75* 3.58* 4.07*  CALCIUM 8.8* 8.9 9.1 9.2 9.2  PHOS  --   --  5.6* 5.2* 5.0*   CBC Recent Labs  Lab 08/25/18 1714 08/26/18 0350  WBC 8.3 8.9  NEUTROABS 5.3  --   HGB 10.8* 10.2*  HCT 37.6 36.0  MCV 91.3 91.1  PLT 269 277    Medications:    . amiodarone  200 mg Oral Daily  . apixaban  5 mg Oral BID  . diltiazem  120 mg Oral Daily  . estradiol  1 mg Oral Daily  . famotidine  20 mg Oral Daily  . insulin aspart  0-9 Units Subcutaneous TID WC  . levothyroxine  137 mcg Oral QAC breakfast   . magnesium oxide  200 mg Oral QODAY  . metolazone  5 mg Oral BID  . metoprolol tartrate  12.5 mg Oral BID  . polyethylene glycol  17 g Oral Daily  . sodium chloride flush  3 mL Intravenous Q12H   Taevion Sikora A Pamula [facility]  08/29/2018, 9:53 AM

## 2018-08-29 NOTE — Discharge Instructions (Addendum)
[name],  You were in the hospital with heart and kidney failure. You were managed with diuretics and your medications have been adjusted on discharged. Metolazone 2.5mg  daily has been added to your medications. Please follow-up with your PCP and Dr. Joelyn Oms.   Information on my medicine - ELIQUIS (apixaban)  Why was Eliquis prescribed for you? Eliquis was prescribed for you to reduce the risk of a blood clot forming that can cause a stroke if you have a medical condition called atrial fibrillation (a type of irregular heartbeat).  What do You need to know about Eliquis ? Take your Eliquis TWICE DAILY - one tablet in the morning and one tablet in the evening with or without food. If you have difficulty swallowing the tablet whole please discuss with your pharmacist how to take the medication safely.  Take Eliquis exactly as prescribed by your doctor and DO NOT stop taking Eliquis without talking to the doctor who prescribed the medication.  Stopping may increase your risk of developing a stroke.  Refill your prescription before you run out.  After discharge, you should have regular check-up appointments with your healthcare provider that is prescribing your Eliquis.  In the future your dose may need to be changed if your kidney function or weight changes by a significant amount or as you get older.  What do you do if you miss a dose? If you miss a dose, take it as soon as you remember on the same day and resume taking twice daily.  Do not take more than one dose of ELIQUIS at the same time to make up a missed dose.  Important Safety Information A possible side effect of Eliquis is bleeding. You should call your healthcare provider right away if you experience any of the following: ? Bleeding from an injury or your nose that does not stop. ? Unusual colored urine (red or dark brown) or unusual colored stools (red or black). ? Unusual bruising for unknown reasons. ? A serious  fall or if you hit your head (even if there is no bleeding).  Some medicines may interact with Eliquis and might increase your risk of bleeding or clotting while on Eliquis. To help avoid this, consult your healthcare provider or pharmacist prior to using any new prescription or non-prescription medications, including herbals, vitamins, non-steroidal anti-inflammatory drugs (NSAIDs) and supplements.  This website has more information on Eliquis (apixaban): http://www.eliquis.com/eliquis/home

## 2018-08-29 NOTE — Progress Notes (Signed)
Physical Therapy Treatment Patient Details Name: [name] MRN: [id number] DOB: [birth year] Today's Date: 08/29/2018    History of Present Illness 78 y.o. 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 Admitted for  Acute exacerbation of Combined Systolic/diastolic CHF .    PT Comments    Pt family brought her Rollator and pt reports it is much steadier for ambulation. Pt is min guard for transfers and ambulation of 80 feet. Pt with 3/4 DoE at end of ambulation with SaO2 of 92%O2 on RA. Pt replaces supplemental 2L O2 via [place] for comfort.  Pt then becomes emotional about kidney failure and asked to not have to work with PT anymore. Attempted to educate on the benefits of exercise, however states she will do PT when she gets home. PT will follow back to confirm request for discharge from PT services.     Follow Up Recommendations  Home health PT     Equipment Recommendations  None recommended by PT    Recommendations for Other Services OT consult     Precautions / Restrictions Precautions Precautions: None Restrictions Weight Bearing Restrictions: No    Mobility  Bed Mobility               General bed mobility comments: Sitting EOB when PT entered room  Transfers Overall transfer level: Needs assistance Equipment used: Rolling walker (2 wheeled) Transfers: Sit to/from Stand Sit to Stand: Min guard         General transfer comment: Min guard for safety. VC for hand placement.  Ambulation/Gait Ambulation/Gait assistance: Min guard Gait Distance (Feet): 80 Feet Assistive device: 4-wheeled walker Gait  Pattern/deviations: Step-through pattern;Decreased stride length;Drifts right/left Gait velocity: slow   General Gait Details: pt family brought Rollator, pt reports it is much steadier than the RW, pt ambulates with mildly unsteady waddling gait but does not require outside assist 3/4 DoE by end of ambulation SaO2 on RA 92%O2, pt replaces [place] with 2 L O2 for comfort       Balance Overall balance assessment: Mild deficits observed, not formally tested                                          Cognition Arousal/Alertness: Awake/alert Behavior During Therapy: WFL for tasks assessed/performed Overall Cognitive Status: Within Functional Limits for tasks assessed                                               Pertinent Vitals/Pain Pain Assessment: No/denies pain           PT Goals (current goals can now be found in the care plan section) Acute Rehab PT Goals Patient Stated Goal: Get well PT Goal Formulation: With patient Time For Goal Achievement: 09/10/18 Potential to Achieve Goals: Good Progress towards PT goals: Progressing toward goals    Frequency    Min 3X/week      PT Plan Current plan remains appropriate  AM-PAC PT "6 Clicks" Mobility   Outcome Measure  Help needed turning from your back to your side while in a flat bed without using bedrails?: A Little Help needed moving from lying on your back to sitting on the side of a flat bed without using bedrails?: A Little Help needed moving to and from a bed to a chair (including a wheelchair)?: A Little Help needed standing up from a chair using your arms (e.g., wheelchair or bedside chair)?: A Little Help needed to walk in hospital room?: A Little Help needed climbing 3-5 steps with a railing? : A Lot 6 Click Score: 17    End of Session Equipment Utilized During Treatment: Gait belt Activity Tolerance: Patient tolerated treatment well Patient left: in bed;with call  bell/phone within reach   PT Visit Diagnosis: Unsteadiness on feet (R26.81);Muscle weakness (generalized) (M62.81);Difficulty in walking, not elsewhere classified (R26.2)     Time: 8099-8338 PT Time Calculation (min) (ACUTE ONLY): 15 min  Charges:  $Gait Training: 8-22 mins                     Brilee Port B. Migdalia Dk PT, DPT Acute Rehabilitation Services Pager [phone] Office [phone]    [place] 08/29/2018, 5:35 PM

## 2018-08-29 NOTE — Progress Notes (Addendum)
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 BNP 344. Chest x-ray with cardiomegaly and mild pulmonary edema. She was seen by nephrology Dr Carolin Sicks and started on IV diuretics. She did haveincrease urine output after IV Bumex. She was transferred to [facility] upon patient's request to be evaluated by her primary cardiologist.  08/29/2018: Patient seen.  Continues to have lower extremity edema. Input from cardiology and nephrology is appreciated.  IV Feraheme discontinued due to report of back pain.  Nephrology is managing. Denies chest pain.    Assessment & Plan:   Principal Problem:   Acute on chronic combined systolic and diastolic CHF (congestive heart failure) (HCC) Active Problems:   Obesity, Class III, BMI 40-49.9 (morbid obesity) (HCC)   DM neuropathy, type II diabetes mellitus (HCC)   CAD (coronary artery disease)   A-fib (HCC)   Atrial fibrillation (HCC)   Chronic kidney disease (CKD), active medical management without dialysis, stage 5 ([place])   DOE (dyspnea on exertion)   1. Acute exacerbation of Combined Systolic/diastolic CHF/volume overload:  On IV diuresis per nephrology. O2 close to baseline requirement. Monitor daily weights. I/Os. Can likely be transitioned to oral  diuretic in next 48 hours. Seen by  Cardiology and increased amio dose. Nephrology increased IV bumex to TID and added metolazone.  Last echo in 2018 showed EF 45-50% . Will repeat -08/28/2018: Continue IV Bumex and metolazone.  Continue to monitor I's and O's.  Last echocardiogram done was on 11/22/2016, an EF of 45 to 50% was documented.  Query value in repeating echocardiogram. 08/29/2018: IV bumetanide.  Dose increased and metolazone added per nephrology.  Cardiology signed off.  2. A.flutter/ paroxysmal A.fib:  S/P ablation in 2016 with recurrence, now on eliquis and amiodarone dose increased by cardiology given c/o palpitations which could indicate indicate intermittent RVR precipitating #1. Continue diltiazem long-acting 120 mg daily, metoprolol 12.5 mg twice daily . HR in 80s now 08/29/2018: Continue current medications as per cardiology.  They have signed off.  3. CAD:  Stable.   -Continue beta-blocker 08/29/2018: Denies having any chest pain.  4. Chronic kidney disease stage V, with volume overload:  Patient is currently on IV Bumex 5 mg every 8 hourly and oral metolazone 5 Mg p.o. twice daily.   Continue to monitor I's and O's.   History of renal cell carcinoma status post right nephrectomy.   Patient is currently volume overloaded, not very responsive to diuretics.   Creation of vascular abscess for possible renal replacement therapy is currently in progress.   Nephrology input is appreciated.   Anemia of CKD, but hemoglobin is at goal. 08/29/2018: Titrating diuretics to improve fluid status per nephrology.  Appreciate nephrology help. BUN/creatinine worsened today.  Continue to monitor.  5. Hypothyroidism:  Synthroid  6. 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   DVT prophylaxis: on apixaban 5 Mg p.o. twice daily  Code Status: full  code Family / Patient Communication:  Attempted to call family. Disposition Plan: This will depend on hospital course   Consultants:   Cardiology,  nephrology   Procedures:   None  Antimicrobials:   None    Subjective: Complaining of nausea.  Still has volume overload, bilateral lower extremity edema.  Denies having any chest pain.  Objective: Vitals:   08/28/18 2036 08/29/18 0628 08/29/18 0916 08/29/18 0920  BP: (!) 98/56 99/61 113/64 113/64  Pulse: 79 69  78  Resp: 16 16    Temp: 97.8 F (36.6 C) 97.6 F (36.4 C)    TempSrc: Oral Oral    SpO2: 95% 97%    Weight:  116.5 kg    Height:        Intake/Output Summary (Last 24 hours) at 08/29/2018 1535 Last data filed at 08/29/2018 1512 Gross per 24 hour  Intake 794.15 ml  Output 4900 ml  Net -4105.85 ml   Filed Weights   08/28/18 0431 08/28/18 0743 08/29/18 0628  Weight: 118.3 kg 118.5 kg 116.5 kg    Examination:  General exam: Obese female, does not appear to be in any acute distress at this time Respiratory system: Decreased breath sounds lower lobes, few crackles, no rhonchi or wheezing Cardiovascular system: S1 & S2 heard Gastrointestinal system: Morbidly obese, soft and nontender. Normal bowel sounds heard. Central nervous system: Alert and oriented.  Able to move all extremities  Extremities: Bilateral lower extremity edema Psychiatry: Judgement and insight appear normal. Mood & affect appropriate.   Data Reviewed: I have personally reviewed following labs and imaging studies  CBC: Recent Labs  Lab 08/25/18 1714 08/26/18 0350  WBC 8.3 8.9  NEUTROABS 5.3  --   HGB 10.8* 10.2*  HCT 37.6 36.0  MCV 91.3 91.1  PLT 269 119   Basic Metabolic Panel: Recent Labs  Lab 08/25/18 1714 08/26/18 0350 08/27/18 0423 08/28/18 0423 08/29/18 0418  NA 138 140 136 136 134*  K 3.9 4.0 4.7 3.9 3.3*  CL 103 102 100 99 94*  CO2 27 27 23 24 27   GLUCOSE 99 83 163* 173* 160*  BUN 34* 35* 41* 50* 56*  CREATININE 3.38* 3.38* 3.75* 3.58* 4.07*  CALCIUM 8.8* 8.9 9.1 9.2 9.2  PHOS  --   --  5.6* 5.2* 5.0*   GFR: Estimated Creatinine Clearance: 14.5 mL/min (A) (by  C-G formula based on SCr of 4.07 mg/dL (H)). Liver Function Tests: Recent Labs  Lab 08/25/18 1714 08/26/18 0350 08/27/18 0423 08/28/18 0423 08/29/18 0418  AST 20 18  --   --   --   ALT 14 13  --   --   --   ALKPHOS 69 64  --   --   --   BILITOT 0.6 0.6  --   --   --   PROT 6.7 6.7  --   --   --   ALBUMIN 3.4* 3.4* 3.4* 3.2* 3.1*   No results for input(s): LIPASE, AMYLASE in the last 168 hours. No results for input(s): AMMONIA in the last 168 hours. Coagulation Profile: No results for input(s): INR, PROTIME in the last 168 hours. Cardiac Enzymes: No results for input(s): CKTOTAL, CKMB, CKMBINDEX, TROPONINI in the last 168 hours. BNP (last 3 results) No results for input(s): PROBNP in the last 8760 hours. HbA1C: No results for input(s): HGBA1C in the last 72 hours. CBG: Recent Labs  Lab 08/28/18 1212 08/28/18 1636 08/28/18 2222 08/29/18 0753 08/29/18 1140  GLUCAP 155* 161* 146*  201* 110*   Lipid Profile: No results for input(s): CHOL, HDL, LDLCALC, TRIG, CHOLHDL, LDLDIRECT in the last 72 hours. Thyroid Function Tests: Recent Labs    08/26/18 1659  TSH 1.933   Anemia Panel: Recent Labs    08/27/18 1110  FERRITIN 12  TIBC 462*  IRON 26*   Sepsis Labs: No results for input(s): PROCALCITON, LATICACIDVEN in the last 168 hours.  Recent Results (from the past 240 hour(s))  SARS Coronavirus 2 (CEPHEID - Performed in [place] hospital lab), Hosp Order     Status: None   Collection Time: 08/25/18  9:25 PM   Specimen: Nasopharyngeal Swab  Result Value Ref Range Status   SARS Coronavirus 2 NEGATIVE NEGATIVE Final    Comment: (NOTE) If result is NEGATIVE SARS-CoV-2 target nucleic acids are NOT DETECTED. The SARS-CoV-2 RNA is generally detectable in upper and lower  respiratory specimens during the acute phase of infection. The lowest  concentration of SARS-CoV-2 viral copies this assay can detect is 250  copies / mL. A negative result does not preclude  SARS-CoV-2 infection  and should not be used as the sole basis for treatment or other  patient management decisions.  A negative result may occur with  improper specimen collection / handling, submission of specimen other  than nasopharyngeal swab, presence of viral mutation(s) within the  areas targeted by this assay, and inadequate number of viral copies  (<250 copies / mL). A negative result must be combined with clinical  observations, patient history, and epidemiological information. If result is POSITIVE SARS-CoV-2 target nucleic acids are DETECTED. The SARS-CoV-2 RNA is generally detectable in upper and lower  respiratory specimens dur ing the acute phase of infection.  Positive  results are indicative of active infection with SARS-CoV-2.  Clinical  correlation with patient history and other diagnostic information is  necessary to determine patient infection status.  Positive results do  not rule out bacterial infection or co-infection with other viruses. If result is PRESUMPTIVE POSTIVE SARS-CoV-2 nucleic acids MAY BE PRESENT.   A presumptive positive result was obtained on the submitted specimen  and confirmed on repeat testing.  While 2019 novel coronavirus  (SARS-CoV-2) nucleic acids may be present in the submitted sample  additional confirmatory testing may be necessary for epidemiological  and / or clinical management purposes  to differentiate between  SARS-CoV-2 and other Sarbecovirus currently known to infect humans.  If clinically indicated additional testing with an alternate test  methodology [phone]) is advised. The SARS-CoV-2 RNA is generally  detectable in upper and lower respiratory sp ecimens during the acute  phase of infection. The expected result is Negative. Fact Sheet for Patients:  StrictlyIdeas.no Fact Sheet for Healthcare Providers: BankingDealers.co.za This test is not yet approved or cleared by the  [place] FDA and has been authorized for detection and/or diagnosis of SARS-CoV-2 by FDA under an Emergency Use Authorization (EUA).  This EUA will remain in effect (meaning this test can be used) for the duration of the COVID-19 declaration under Section 564(b)(1) of the Act, 21 U.S.C. section 360bbb-3(b)(1), unless the authorization is terminated or revoked sooner. Performed at [facility], [address], [place], [place] [address]          Radiology Studies: US Renal  Result Date: 08/28/2018 CLINICAL DATA:  Acute renal failure EXAM: RENAL / URINARY TRACT ULTRASOUND COMPLETE COMPARISON:  CT abdomen pelvis 03/16/2017 FINDINGS: Right Kidney: Surgically absent Left Kidney: Renal measurements: 10.6 x 4.5 x 5.8 cm = volume:  144.2 mL. Renal cortical thinning. Increased renal cortical echogenicity. No hydronephrosis. Bladder: Not visualized IMPRESSION: No hydronephrosis. Findings suggestive of chronic renal disease involving the left kidney. Electronically Signed   By: Lovey Newcomer M.D.   On: 08/28/2018 11:55        Scheduled Meds:  amiodarone  200 mg Oral Daily   apixaban  5 mg Oral BID   diltiazem  120 mg Oral Daily   estradiol  1 mg Oral Daily   famotidine  20 mg Oral Daily   insulin aspart  0-9 Units Subcutaneous TID WC   levothyroxine  137 mcg Oral QAC breakfast   magnesium oxide  200 mg Oral QODAY   metolazone  5 mg Oral BID   metoprolol tartrate  12.5 mg Oral BID   polyethylene glycol  17 g Oral Daily   sodium chloride flush  3 mL Intravenous Q12H   Continuous Infusions:  sodium chloride 250 mL (08/29/18 0643)   bumetanide (BUMEX) IV 4 mg (08/29/18 1512)   ferumoxytol Stopped (08/28/18 2128)     LOS: 4 days    Time spent: 35 min    Aquiles Ruffini, MD [facility] Pager on [place]  If 7PM-7AM, please contact night-coverage [email or web address] Password [email or web address] 08/29/2018, 3:35 PM

## 2018-08-30 LAB — GLUCOSE, CAPILLARY
Glucose-Capillary: 176 mg/dL — ABNORMAL HIGH (ref 70–99)
Glucose-Capillary: 152 mg/dL — ABNORMAL HIGH (ref 70–99)
Glucose-Capillary: 162 mg/dL — ABNORMAL HIGH (ref 70–99)
Glucose-Capillary: 193 mg/dL — ABNORMAL HIGH (ref 70–99)
Glucose-Capillary: 158 mg/dL — ABNORMAL HIGH (ref 70–99)
Glucose-Capillary: 188 mg/dL — ABNORMAL HIGH (ref 70–99)
Glucose-Capillary: 228 mg/dL — ABNORMAL HIGH (ref 70–99)

## 2018-08-30 LAB — RENAL FUNCTION PANEL
Albumin: 3.3 g/dL — ABNORMAL LOW (ref 3.5–5.0)
Anion gap: 15 (ref 5–15)
BUN: 67 mg/dL — ABNORMAL HIGH (ref 8–23)
CO2: 30 mmol/L (ref 22–32)
Calcium: 9.7 mg/dL (ref 8.9–10.3)
Chloride: 90 mmol/L — ABNORMAL LOW (ref 98–111)
Creatinine, Ser: 4.3 mg/dL — ABNORMAL HIGH (ref 0.44–1.00)
GFR calc Af Amer: 11 mL/min — ABNORMAL LOW (ref >60)
GFR calc non Af Amer: 9 mL/min — ABNORMAL LOW (ref >60)
Glucose, Bld: 164 mg/dL — ABNORMAL HIGH (ref 70–99)
Phosphorus: 5.1 mg/dL — ABNORMAL HIGH (ref 2.5–4.6)
Potassium: 3.4 mmol/L — ABNORMAL LOW (ref 3.5–5.1)
Sodium: 135 mmol/L (ref 135–145)

## 2018-08-30 LAB — MAGNESIUM: Magnesium: 2.1 mg/dL (ref 1.7–2.4)

## 2018-08-30 MED ORDER — METOLAZONE 5 MG PO TABS: 2.5000 mg | ORAL_TABLET | Freq: Every day | ORAL | Status: DC

## 2018-08-30 MED ORDER — METOLAZONE 5 MG PO TABS
2.5000 mg | ORAL_TABLET | ORAL | Status: DC
  Administered 2018-08-31: 2.5 mg via ORAL
  Filled 2018-08-30: qty 1

## 2018-08-30 MED ORDER — TORSEMIDE 20 MG PO TABS
40.0000 mg | ORAL_TABLET | Freq: Two times a day (BID) | ORAL | Status: DC
  Administered 2018-08-30 – 2018-08-31 (×2): 40 mg via ORAL
  Filled 2018-08-30 (×2): qty 2

## 2018-08-30 MED ORDER — POTASSIUM CHLORIDE CRYS ER 20 MEQ PO TBCR
40.0000 meq | EXTENDED_RELEASE_TABLET | Freq: Once | ORAL | Status: AC
  Administered 2018-08-30: 40 meq via ORAL
  Filled 2018-08-30: qty 2

## 2018-08-30 NOTE — Care Management Important Message (Signed)
Important Message  Patient Details  Name: [name] MRN: [id number] Date of Birth: 1940-10-12   Medicare Important Message Given:  Yes     Shelda Altes 08/30/2018, 1:53 PM

## 2018-08-30 NOTE — Progress Notes (Signed)
Patient ID: [name], female   DOB: June 30, 1940, 78 y.o.   MRN: [id number] [facility] Progress Note   Assessment/ Plan:   1. Acute kidney Injury on chronic kidney disease stage IV: Patient with solitary kidney and underlying chronic kidney disease from hypertension and likely cardiorenal syndrome.  Her acute renal insufficiency appears to be from CHF exacerbation/hemodynamic mechanism and likely will fluctuate with efforts at increased diuresis/volume unloading for symptomatic improvement.  She is status post first stage brachiobasilic fistula by Dr. Donzetta Matters on 08/02/2018.  Have been successful with diuresis but with not unexpected inc in creatinine but not uremic.  Will dec metolazone quite a bit from 5 BID to 2.5 daily and convert iv bumex to PO torsemide that she takes at home.  She is not uremic so may be able to be discharged on this regimen with daily weights and follow up with Dr. Joelyn Oms  2.  Acute exacerbation of congestive heart failure: good response to intravenous bumetanide plus metolazone overnight,  Back off a little today.  Seen earlier by cardiology who have signed off.  3.  Anemia: Likely secondary to chronic kidney disease, will send off iron studies. Had "reaction " to IV iron-  hgb over 10- no ESA yet 4.  Secondary hyperparathyroidism: Calcium level within acceptable range, hyperphosphatemia noted-we will continue to trend this to decide need for binder. Not yet  5.  Chronic atrial fibrillation: Appears rate controlled on diltiazem/metoprolol/amiodarone and on anticoagulation with Eliquis. 6. Hypokalemia- give dose again today   Subjective:     Over 4 liters of UOP which is great but crt inc.   Overall feels better with weight down 7.2 pounds since admit - wants to think about going home    Objective:   BP 129/67   Pulse 84   Temp (!) 97.5 F (36.4 C) (Oral)   Resp 18   Ht 5\' 2"  (1.575 m)   Wt 114.7 kg   SpO2 93%   BMI 46.24 kg/m   Intake/Output Summary  (Last 24 hours) at 08/30/2018 1116 Last data filed at 08/30/2018 0542 Gross per 24 hour  Intake 898.87 ml  Output 3700 ml  Net -2801.13 ml   Weight change: -3.856 kg  Physical Exam: Gen: Comfortably sitting up on the side of her bed, on oxygen via nasal cannula.  Speaks complete sentences- morbidly obese CVS: Pulse irregularly irregular, normal rate, S1 and S2 normal Resp: Diminished breath sounds over bases, no distinct rales or rhonchi Abd: Soft, obese, nontender Ext: 1-2+ pitting lower extremity edema Left upper arm AVF with thrill  Imaging: US Renal  Result Date: 08/28/2018 CLINICAL DATA:  Acute renal failure EXAM: RENAL / URINARY TRACT ULTRASOUND COMPLETE COMPARISON:  CT abdomen pelvis 03/16/2017 FINDINGS: Right Kidney: Surgically absent Left Kidney: Renal measurements: 10.6 x 4.5 x 5.8 cm = volume: 144.2 mL. Renal cortical thinning. Increased renal cortical echogenicity. No hydronephrosis. Bladder: Not visualized IMPRESSION: No hydronephrosis. Findings suggestive of chronic renal disease involving the left kidney. Electronically Signed   By: Lovey Newcomer M.D.   On: 08/28/2018 11:55    Labs: BMET Recent Labs  Lab 08/25/18 1714 08/26/18 0350 08/27/18 0423 08/28/18 0423 08/29/18 0418 08/30/18 0258  NA 138 140 136 136 134* 135  K 3.9 4.0 4.7 3.9 3.3* 3.4*  CL 103 102 100 99 94* 90*  CO2 27 27 23 24 27 30   GLUCOSE 99 83 163* 173* 160* 164*  BUN 34* 35* 41* 50* 56* 67*  CREATININE  3.38* 3.38* 3.75* 3.58* 4.07* 4.30*  CALCIUM 8.8* 8.9 9.1 9.2 9.2 9.7  PHOS  --   --  5.6* 5.2* 5.0* 5.1*   CBC Recent Labs  Lab 08/25/18 1714 08/26/18 0350  WBC 8.3 8.9  NEUTROABS 5.3  --   HGB 10.8* 10.2*  HCT 37.6 36.0  MCV 91.3 91.1  PLT 269 277    Medications:    . amiodarone  200 mg Oral Daily  . apixaban  5 mg Oral BID  . diltiazem  120 mg Oral Daily  . estradiol  1 mg Oral Daily  . famotidine  20 mg Oral Daily  . insulin aspart  0-9 Units Subcutaneous TID WC  . levothyroxine   137 mcg Oral QAC breakfast  . magnesium oxide  200 mg Oral QODAY  . metolazone  5 mg Oral BID  . metoprolol tartrate  12.5 mg Oral BID  . polyethylene glycol  17 g Oral Daily  . sodium chloride flush  3 mL Intravenous Q12H   Elverta Dimiceli A Jaimey Franchini  08/30/2018, 11:16 AM

## 2018-08-30 NOTE — Progress Notes (Addendum)
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 BNP 344. Chest x-ray with cardiomegaly and mild pulmonary edema. She was seen by nephrology Dr Carolin Sicks and started on IV diuretics. She did haveincrease urine output after IV Bumex. She was transferred to [facility] upon patient's request to be evaluated by her primary cardiologist.  08/30/2018: Patient seen. Still having extremity edema. She says she is feeling better. IV Feraheme discontinued due to report of back pain. Nephrology is managing diuretics. Appreciate help.  Denies chest pain.   Assessment & Plan:   Principal Problem:   Acute on chronic combined systolic and diastolic CHF (congestive heart failure) (HCC) Active Problems:   Obesity, Class III, BMI 40-49.9 (morbid obesity) (HCC)   DM neuropathy, type II diabetes mellitus (HCC)   CAD (coronary artery disease)   A-fib (HCC)   Atrial fibrillation (HCC)   Chronic kidney disease (CKD), active medical management without dialysis, stage 5 ([place])   DOE (dyspnea on exertion)   1. Acute exacerbation of Combined Systolic/diastolic CHF/volume overload:  On IV diuresis per nephrology. O2 close to baseline requirement. Monitor daily weights. I/Os. Can likely be transitioned to oral  diuretic in next 48 hours. Seen by Cardiology and increased amio dose. Nephrology increased IV bumex to TID and added metolazone. Last echo in 2018 showed EF 45-50% . Will repeat -08/28/2018: Continue IV Bumex and metolazone. Continue to monitor I's and O's. Last echocardiogram done was on 11/22/2016, an EF of 45 to 50% was documented. Query value in repeating echocardiogram. 08/29/2018: IV bumetanide.  Dose increased and metolazone added per nephrology.  Cardiology signed off. 08/30/2018: Patient had some diuresis.  Per nephrology decreased metolazone from 5 mg twice daily to 2.5 daily and convert IV Bumex to p.o. torsemide.  2. A.flutter/ paroxysmal A.fib:  S/P ablation in 2016 with recurrence, now on eliquis and amiodarone dose increased by cardiology given c/o palpitations which could indicate indicate intermittent RVR precipitating #1. Continue diltiazem long-acting 120 mg daily, metoprolol 12.5 mg twice daily . HR in 80s now 08/30/2018: Continue current medications as per cardiology.  They have signed off.  3. CAD:  Stable. -Continue beta-blocker 08/30/2018: Denies having any chest pain.  4. Chronic kidney disease stage V, with volume overload:  Patient is currently on IV Bumex 5 mg every 8 hourly and oral metolazone 5 Mg p.o. twice daily.  Continue to monitor I's and O's.  History of renal cell carcinoma status post right nephrectomy.  Patient is currently volume overloaded, not very responsive to diuretics.  Creation of vascular abscess for possible renal replacement therapy is currently in progress.  Nephrology input is appreciated.  Anemia of CKD, but hemoglobin is at goal. 08/29/2018: Titrating diuretics to improve fluid status per nephrology.  Appreciate nephrology help. BUN/creatinine worsened today.  Continue to monitor. 08/30/2018: Per nephrology decrease metolazone from 5 mg twice daily to 2.5 daily and convert IV Bumex to p.o.  torsemide.  Continue to monitor BUN/cr.  5.  Hypothyroidism:  Synthroid  6. 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   DVT prophylaxis:on apixaban 5 Mg p.o. twice daily Code Status:full code Family / Patient Communication: Attempted to call daughter but was unable to reach. Disposition Plan:This will depend on hospital course   Consultants:   Cardiology, nephrology   Procedures:   None  Antimicrobials:   None    Subjective: She says she is feeling a little better.  Creatinine increased.  Objective: Vitals:   08/30/18 0534 08/30/18 0857 08/30/18 0903 08/30/18 1402  BP: 120/66 129/67 129/67 98/63  Pulse:  84  63  Resp: 18   20  Temp: (!) 97.5 F (36.4 C)   (!) 97.5 F (36.4 C)  TempSrc: Oral   Oral  SpO2: 93%   98%  Weight: 114.7 kg     Height:        Intake/Output Summary (Last 24 hours) at 08/30/2018 1709 Last data filed at 08/30/2018 1617 Gross per 24 hour  Intake 1444.87 ml  Output 3550 ml  Net -2105.13 ml   Filed Weights   08/28/18 0743 08/29/18 0628 08/30/18 0534  Weight: 118.5 kg 116.5 kg 114.7 kg    Examination:  General exam: Obese female, appears calm and comfortable  Respiratory system: Decreased breath sounds lower lobes, no rhonchi, few crackles. Respiratory effort normal. Cardiovascular system: Irregularly irregular Gastrointestinal system: Morbidly obese, soft, nontender.  Normal bowel sounds.  Central nervous system: Alert and oriented. No focal neurological deficits. Extremities: Bilateral lower extremity edema Psychiatry: Judgement and insight appear normal. Mood & affect appropriate.   Data Reviewed: I have personally reviewed following labs and imaging studies  CBC: Recent Labs  Lab 08/25/18 1714 08/26/18 0350  WBC 8.3 8.9  NEUTROABS 5.3  --   HGB 10.8* 10.2*  HCT 37.6 36.0  MCV 91.3 91.1  PLT 269 741   Basic Metabolic Panel: Recent Labs  Lab 08/26/18 0350 08/27/18 0423 08/28/18 0423 08/29/18 0418 08/30/18 0258  NA 140 136 136 134* 135  K 4.0 4.7 3.9 3.3* 3.4*  CL 102 100  99 94* 90*  CO2 27 23 24 27 30   GLUCOSE 83 163* 173* 160* 164*  BUN 35* 41* 50* 56* 67*  CREATININE 3.38* 3.75* 3.58* 4.07* 4.30*  CALCIUM 8.9 9.1 9.2 9.2 9.7  MG  --   --   --   --  2.1  PHOS  --  5.6* 5.2* 5.0* 5.1*   GFR: Estimated Creatinine Clearance: 13.5 mL/min (A) (by C-G formula based on SCr of 4.3 mg/dL (H)). Liver Function Tests: Recent Labs  Lab 08/25/18 1714 08/26/18 0350 08/27/18 0423 08/28/18 0423 08/29/18 0418 08/30/18 0258  AST 20 18  --   --   --   --   ALT 14 13  --   --   --   --   ALKPHOS 69 64  --   --   --   --   BILITOT 0.6 0.6  --   --   --   --   PROT 6.7 6.7  --   --   --   --   ALBUMIN 3.4* 3.4* 3.4* 3.2* 3.1* 3.3*   No results for input(s): LIPASE, AMYLASE in the last 168 hours. No results for input(s): AMMONIA in the last 168 hours. Coagulation Profile: No results for input(s): INR, PROTIME in the last 168 hours. Cardiac Enzymes: No results for input(s): CKTOTAL, CKMB, CKMBINDEX, TROPONINI in the last  168 hours. BNP (last 3 results) No results for input(s): PROBNP in the last 8760 hours. HbA1C: No results for input(s): HGBA1C in the last 72 hours. CBG: Recent Labs  Lab 08/29/18 1644 08/29/18 2118 08/30/18 0804 08/30/18 1107 08/30/18 1612  GLUCAP 170* 206* 193* 158* 188*   Lipid Profile: No results for input(s): CHOL, HDL, LDLCALC, TRIG, CHOLHDL, LDLDIRECT in the last 72 hours. Thyroid Function Tests: No results for input(s): TSH, T4TOTAL, FREET4, T3FREE, THYROIDAB in the last 72 hours. Anemia Panel: No results for input(s): VITAMINB12, FOLATE, FERRITIN, TIBC, IRON, RETICCTPCT in the last 72 hours. Sepsis Labs: No results for input(s): PROCALCITON, LATICACIDVEN in the last 168 hours.  Recent Results (from the past 240 hour(s))  SARS Coronavirus 2 (CEPHEID - Performed in [facility] hospital lab), Hosp Order     Status: None   Collection Time: 08/25/18  9:25 PM   Specimen: Nasopharyngeal Swab  Result Value Ref Range Status    SARS Coronavirus 2 NEGATIVE NEGATIVE Final    Comment: (NOTE) If result is NEGATIVE SARS-CoV-2 target nucleic acids are NOT DETECTED. The SARS-CoV-2 RNA is generally detectable in upper and lower  respiratory specimens during the acute phase of infection. The lowest  concentration of SARS-CoV-2 viral copies this assay can detect is 250  copies / mL. A negative result does not preclude SARS-CoV-2 infection  and should not be used as the sole basis for treatment or other  patient management decisions.  A negative result may occur with  improper specimen collection / handling, submission of specimen other  than nasopharyngeal swab, presence of viral mutation(s) within the  areas targeted by this assay, and inadequate number of viral copies  (<250 copies / mL). A negative result must be combined with clinical  observations, patient history, and epidemiological information. If result is POSITIVE SARS-CoV-2 target nucleic acids are DETECTED. The SARS-CoV-2 RNA is generally detectable in upper and lower  respiratory specimens dur ing the acute phase of infection.  Positive  results are indicative of active infection with SARS-CoV-2.  Clinical  correlation with patient history and other diagnostic information is  necessary to determine patient infection status.  Positive results do  not rule out bacterial infection or co-infection with other viruses. If result is PRESUMPTIVE POSTIVE SARS-CoV-2 nucleic acids MAY BE PRESENT.   A presumptive positive result was obtained on the submitted specimen  and confirmed on repeat testing.  While 2019 novel coronavirus  (SARS-CoV-2) nucleic acids may be present in the submitted sample  additional confirmatory testing may be necessary for epidemiological  and / or clinical management purposes  to differentiate between  SARS-CoV-2 and other Sarbecovirus currently known to infect humans.  If clinically indicated additional testing with an alternate test   methodology [phone]) is advised. The SARS-CoV-2 RNA is generally  detectable in upper and lower respiratory sp ecimens during the acute  phase of infection. The expected result is Negative. Fact Sheet for Patients:  StrictlyIdeas.no Fact Sheet for Healthcare Providers: BankingDealers.co.za This test is not yet approved or cleared by the [place] FDA and has been authorized for detection and/or diagnosis of SARS-CoV-2 by FDA under an Emergency Use Authorization (EUA).  This EUA will remain in effect (meaning this test can be used) for the duration of the COVID-19 declaration under Section 564(b)(1) of the Act, 21 U.S.C. section 360bbb-3(b)(1), unless the authorization is terminated or revoked sooner. Performed at [facility], [address], [place], [place] [address]  Radiology Studies: No results found.      Scheduled Meds: . amiodarone  200 mg Oral Daily  . apixaban  5 mg Oral BID  . diltiazem  120 mg Oral Daily  . estradiol  1 mg Oral Daily  . famotidine  20 mg Oral Daily  . insulin aspart  0-9 Units Subcutaneous TID WC  . levothyroxine  137 mcg Oral QAC breakfast  . magnesium oxide  200 mg Oral QODAY  . [START ON 08/31/2018] metolazone  2.5 mg Oral Q24H  . metoprolol tartrate  12.5 mg Oral BID  . polyethylene glycol  17 g Oral Daily  . sodium chloride flush  3 mL Intravenous Q12H  . torsemide  40 mg Oral BID   Continuous Infusions: . sodium chloride Stopped (08/29/18 1540)  . ferumoxytol Stopped (08/28/18 2128)     LOS: 5 days    Time spent: 25 min   Yaakov Guthrie, MD [facility] Pager on amion  If 7PM-7AM, please contact night-coverage [email or web address] Password [email or web address] 08/30/2018, 5:09 PM

## 2018-08-31 LAB — RENAL FUNCTION PANEL
Albumin: 3.4 g/dL — ABNORMAL LOW (ref 3.5–5.0)
Anion gap: 17 — ABNORMAL HIGH (ref 5–15)
BUN: 72 mg/dL — ABNORMAL HIGH (ref 8–23)
CO2: 29 mmol/L (ref 22–32)
Calcium: 9.9 mg/dL (ref 8.9–10.3)
Chloride: 90 mmol/L — ABNORMAL LOW (ref 98–111)
Creatinine, Ser: 4.13 mg/dL — ABNORMAL HIGH (ref 0.44–1.00)
GFR calc Af Amer: 11 mL/min — ABNORMAL LOW (ref >60)
GFR calc non Af Amer: 10 mL/min — ABNORMAL LOW (ref >60)
Glucose, Bld: 167 mg/dL — ABNORMAL HIGH (ref 70–99)
Phosphorus: 5.3 mg/dL — ABNORMAL HIGH (ref 2.5–4.6)
Potassium: 3.5 mmol/L (ref 3.5–5.1)
Sodium: 136 mmol/L (ref 135–145)

## 2018-08-31 LAB — GLUCOSE, CAPILLARY
Glucose-Capillary: 190 mg/dL — ABNORMAL HIGH (ref 70–99)
Glucose-Capillary: 171 mg/dL — ABNORMAL HIGH (ref 70–99)

## 2018-08-31 MED ORDER — POTASSIUM CHLORIDE CRYS ER 20 MEQ PO TBCR
20.0000 meq | EXTENDED_RELEASE_TABLET | Freq: Every day | ORAL | Status: DC
  Administered 2018-08-31: 20 meq via ORAL
  Filled 2018-08-31: qty 1

## 2018-08-31 MED ORDER — METOLAZONE 2.5 MG PO TABS: 2.5000 mg | ORAL_TABLET | Freq: Every day | ORAL | 0 refills | Status: DC

## 2018-08-31 NOTE — Discharge Summary (Signed)
Physician Discharge Summary  [name] GYF:[id number] DOB: 06/10/1940 DOA: 08/25/2018  PCP: Octavio Graves, DO (Inactive)  Admit date: 08/25/2018 Discharge date: 08/31/2018  Admitted From: Home Disposition: Home  Recommendations for Outpatient Follow-up:  1. Follow up with PCP in 1 week 2. Please obtain BMP/CBC in one week 3. Please follow up on the following pending results: None  Home Health: PT Equipment/Devices: None  Discharge Condition: Stable CODE STATUS: Full code Diet recommendation: Heart/renal/carb modified   Brief/Interim Summary:  Admission HPI written by Vashti Hey, MD   [name] is an 78 y.o. 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  Patient and daughter attribute the dyspnea on exertion to 08/02/2018 when she underwent vein mapping for eventual fistula creation.  Patient and daughter both believe that "when they put her to sleep something went wrong".  Apparently since that time patient has had significant dyspnea on exertion although no shortness of breath at rest.  Patient has apparently increased her torsemide dose without good effect at home.  Patient is followed by Dr. [name] in cardiology and Roderic Palau in the A. fib clinic.  Roderic Palau apparently instructed the patient to come to the ED today given no improvement of DOE with increased doses of torsemide.  Patient is on 3 L of oxygen at baseline at home.  Patient denies fevers or chills.  No change in baseline cough.  Patient has baseline orthopnea but denies new PND.  She does have increasing lower extremity edema.  She also thinks her abdominal girth is increasing.  She also thinks she has had a 20 pound weight gain over the past  couple of weeks.  Review of systems is also notable for intermittent chest pain which she has had for several months and that is without change.    Hospital course:  Acute combined systolic and diastolic heart failure EF of 45-50%. Managed by nephrology secondary to concomitant severe renal disease. Diuresed with IV bumex and metolazone 5 mg BID with good diuresis. Transitioned to home torsemide and metolazone 2.5 mg daily on discharge secondary to worsening kidney function on previous regimen. Symptoms resolved prior to discharge.  Atrial flutter vs paroxysmal atrial fibrillation Amiodarone increased secondary to palpitations. Continued diltiazem and metoprolol.  CAD Stable. No chest pain  CKD stage V Management per nephrology.  Hypothyroidism Continued Synthroid.  Diabetes mellitis type 2 Continue home regimen on discharge.  Discharge Diagnoses:  Principal Problem:   Acute on chronic combined systolic and diastolic CHF (congestive heart failure) (HCC) Active Problems:   Obesity, Class III, BMI 40-49.9 (morbid obesity) (HCC)   DM neuropathy, type II diabetes mellitus (HCC)   CAD (coronary artery disease)   A-fib (HCC)   Atrial fibrillation (HCC)   Chronic kidney disease (CKD), active medical management without dialysis, stage 5 ([place])   DOE (dyspnea on exertion)    Discharge Instructions  Discharge Instructions    Increase activity slowly   Complete by: As directed      Allergies as of 08/31/2018      Reactions   Adhesive [tape] Itching, Swelling, Rash, Other (See Comments)   Tears skin and causes blisters also. EKG pads will cause welps.    Avelox [moxifloxacin] Swelling, Rash   Blueberry Flavor  Anaphylaxis   Cefprozil Shortness Of Breath, Rash   Cetacaine [butamben-tetracaine-benzocaine] Nausea And Vomiting, Swelling   Dicyclomine Nausea And Vomiting, Other (See Comments)   "Heart trouble"; Headaches and increased blood sugars "Heart trouble"; Headaches and  increased blood sugars   Food Anaphylaxis, Other (See Comments)   Melons, Bananas, Cantaloupes, Watermelon-throat closes up and blisters    Imdur [isosorbide Nitrate] Hives, Palpitations, Other (See Comments), Rash   Headaches also   Januvia [sitagliptin] Shortness Of Breath   Lipitor [atorvastatin] Shortness Of Breath   Losartan Potassium Shortness Of Breath   Nitroglycerin Other (See Comments)   Caused cardiac arrest and feels like skin bring torn off back of head Caused cardiac arrest and feels like skin bring torn off back of head   Oxycodone Hives, Rash   Tolerates Dilaudid Tolerates Dilaudid   Penicillins Anaphylaxis   Has patient had a PCN reaction causing immediate rash, facial/tongue/throat swelling, SOB or lightheadedness with hypotension: Yes Has patient had a PCN reaction causing severe rash involving mucus membranes or skin necrosis: No Has patient had a PCN reaction that required hospitalization Yes Has patient had a PCN reaction occurring within the last 10 years: No If all of the above answers are "NO", then may proceed with Cephalosporin use. Has patient had a PCN reaction causing immediate rash, facial/tongue/throat swelling, SOB or lightheadedness with hypotension: Yes Has patient had a PCN reaction causing severe rash involving mucus membranes or skin necrosis: No Has patient had a PCN reaction that required hospitalization Yes Has patient had a PCN reaction occurring within the last 10 years: No If all of the above answers are "NO", then may proceed with Cephalosporin use.   Prednisone Anaphylaxis   Vancomycin Anaphylaxis   Hydrocodone Hives   Tolerates Dilaudid   Latex Other (See Comments), Rash   blisters   Tamiflu [oseltamivir] Other (See Comments)   Contraindicated with other medications Patient on tikosyn, and tamiflu interfered with anti arrhythmic med Contraindicated with other medications Patient on tikosyn, and tamiflu interfered with anti arrhythmic  med   Feraheme [ferumoxytol] Other (See Comments)   Sharp pain to lower back and flank   Zyrtec [cetirizine] Other (See Comments)   unspecified   Lasix [furosemide] Hives, Swelling, Rash   Mupirocin Rash      Medication List    TAKE these medications   acetaminophen 500 MG tablet Commonly known as: TYLENOL Take 0.5 tablets (250 mg total) by mouth every 6 (six) hours as needed for mild pain (pain). What changed: when to take this   amiodarone 200 MG tablet Commonly known as: PACERONE Take 100 mg by mouth daily.   apixaban 5 MG Tabs tablet Commonly known as: Eliquis Take 1 tablet (5 mg total) by mouth 2 (two) times daily.   clonazePAM 1 MG tablet Commonly known as: KLONOPIN Take 0.5-1 mg by mouth 3 (three) times daily as needed for anxiety.   diltiazem 120 MG 24 hr capsule Commonly known as: CARDIZEM CD TAKE ONE (1) CAPSULE EACH DAY What changed: See the new instructions.   estradiol 1 MG tablet Commonly known as: ESTRACE Take 1 mg by mouth daily.   famotidine 20 MG tablet Commonly known as: PEPCID Take 20 mg by mouth daily.   glipiZIDE 10 MG 24 hr tablet Commonly known as: GLUCOTROL XL Take 10 mg by mouth daily.   Insulin Glargine 100 UNIT/ML Solostar Pen Commonly known as: LANTUS Inject 5-35 Units into the skin See admin instructions. Before Lunch per sliding scale: Under  100= 20 units, 100-180= 25 units, 180-240= 30 units, Over 240= 35 units At 10pm per sliding Scale: Under 100= 5 units, 100-200= 10 units, Over 200= 15 units   levothyroxine 137 MCG tablet Commonly known as: SYNTHROID Take 137 mcg by mouth daily before breakfast.   magnesium oxide 400 MG tablet Commonly known as: MAG-OX Take 200 mg by mouth every other day.   metolazone 2.5 MG tablet Commonly known as: ZAROXOLYN Take 1 tablet (2.5 mg total) by mouth daily. Start taking on: September 01, 2018   metoprolol tartrate 25 MG tablet Commonly known as: LOPRESSOR TAKE 1/2 TABLET TWICE DAILY What  changed: when to take this   potassium chloride SA 20 MEQ tablet Commonly known as: K-DUR TAKE 1/2 TABLET EVERY DAY   Systane 0.4-0.3 % Soln Generic drug: Polyethyl Glycol-Propyl Glycol Place 1 drop into both eyes 3 (three) times daily as needed (DRY EYE RELIEF).   torsemide 20 MG tablet Commonly known as: DEMADEX TAKE 2 TABLETS TWICE DAILY What changed:   how much to take  when to take this  additional instructions   traMADol 50 MG tablet Commonly known as: Ultram Take 1 tablet (50 mg total) by mouth every 6 (six) hours as needed. What changed: reasons to take this   vitamin B-12 500 MCG tablet Commonly known as: CYANOCOBALAMIN Take 500 mcg by mouth daily.      Follow-up Information    Octavio Graves, DO. Schedule an appointment as soon as possible for a visit in 1 week(s).   Contact information: [address] [address] [phone]        Rexene Agent, MD. Schedule an appointment as soon as possible for a visit.   Specialty: Nephrology Contact information: [place] [address] [phone]          Allergies  Allergen Reactions  . Adhesive [Tape] Itching, Swelling, Rash and Other (See Comments)    Tears skin and causes blisters also. EKG pads will cause welps.   . Avelox [Moxifloxacin] Swelling and Rash  . Blueberry Flavor Anaphylaxis  . Cefprozil Shortness Of Breath and Rash  . Cetacaine [Butamben-Tetracaine-Benzocaine] Nausea And Vomiting and Swelling  . Dicyclomine Nausea And Vomiting and Other (See Comments)    "Heart trouble"; Headaches and increased blood sugars "Heart trouble"; Headaches and increased blood sugars  . Food Anaphylaxis and Other (See Comments)    Melons, Bananas, Cantaloupes, Watermelon-throat closes up and blisters   . Imdur [Isosorbide Nitrate] Hives, Palpitations, Other (See Comments) and Rash    Headaches also  . Januvia [Sitagliptin] Shortness Of Breath  . Lipitor [Atorvastatin]  Shortness Of Breath  . Losartan Potassium Shortness Of Breath  . Nitroglycerin Other (See Comments)    Caused cardiac arrest and feels like skin bring torn off back of head Caused cardiac arrest and feels like skin bring torn off back of head  . Oxycodone Hives and Rash    Tolerates Dilaudid Tolerates Dilaudid  . Penicillins Anaphylaxis    Has patient had a PCN reaction causing immediate rash, facial/tongue/throat swelling, SOB or lightheadedness with hypotension: Yes Has patient had a PCN reaction causing severe rash involving mucus membranes or skin necrosis: No Has patient had a PCN reaction that required hospitalization Yes Has patient had a PCN reaction occurring within the last 10 years: No If all of the above answers are "NO", then may proceed with Cephalosporin use. Has patient had a PCN reaction causing immediate rash, facial/tongue/throat swelling, SOB or lightheadedness  with hypotension: Yes Has patient had a PCN reaction causing severe rash involving mucus membranes or skin necrosis: No Has patient had a PCN reaction that required hospitalization Yes Has patient had a PCN reaction occurring within the last 10 years: No If all of the above answers are "NO", then may proceed with Cephalosporin use.   . Prednisone Anaphylaxis  . Vancomycin Anaphylaxis  . Hydrocodone Hives    Tolerates Dilaudid  . Latex Other (See Comments) and Rash    blisters  . Tamiflu [Oseltamivir] Other (See Comments)    Contraindicated with other medications Patient on tikosyn, and tamiflu interfered with anti arrhythmic med Contraindicated with other medications Patient on tikosyn, and tamiflu interfered with anti arrhythmic med  . Feraheme [Ferumoxytol] Other (See Comments)    Sharp pain to lower back and flank  . Zyrtec [Cetirizine] Other (See Comments)    unspecified  . Lasix [Furosemide] Hives, Swelling and Rash  . Mupirocin Rash    Consultations:  Cardiology  Nephrology    Procedures/Studies: US Renal  Result Date: 08/28/2018 CLINICAL DATA:  Acute renal failure EXAM: RENAL / URINARY TRACT ULTRASOUND COMPLETE COMPARISON:  CT abdomen pelvis 03/16/2017 FINDINGS: Right Kidney: Surgically absent Left Kidney: Renal measurements: 10.6 x 4.5 x 5.8 cm = volume: 144.2 mL. Renal cortical thinning. Increased renal cortical echogenicity. No hydronephrosis. Bladder: Not visualized IMPRESSION: No hydronephrosis. Findings suggestive of chronic renal disease involving the left kidney. Electronically Signed   By: Lovey Newcomer M.D.   On: 08/28/2018 11:55   Dg Chest Port 1 View  Result Date: 08/25/2018 CLINICAL DATA:  Shortness of breath and mid chest pain for 3 weeks, weight gain, to start dialysis soon, history CHF, type II diabetes mellitus, hypertension, cancer of the RIGHT renal pelvis EXAM: PORTABLE CHEST 1 VIEW COMPARISON:  Portable exam 1643 hours compared to 03/16/2017 FINDINGS: Enlargement of cardiac silhouette with pulmonary vascular congestion. Atherosclerotic calcification aorta. Loop recorder projects over lower LEFT chest. Hazy pulmonary markings may reflect minimal pulmonary edema. No segmental consolidation, pleural effusion or pneumothorax. Bones unremarkable. IMPRESSION: Enlargement of cardiac silhouette with pulmonary vascular congestion and suspect minimal pulmonary edema. Electronically Signed   By: Lavonia Dana M.D.   On: 08/25/2018 17:07      Subjective: Feels better. No dyspnea  Discharge Exam: Vitals:   08/31/18 0545 08/31/18 0900  BP: (!) 119/57 (!) 102/56  Pulse:    Resp: 16   Temp: 97.7 F (36.5 C)   SpO2: 94%    Vitals:   08/30/18 1947 08/31/18 0541 08/31/18 0545 08/31/18 0900  BP: (!) 123/59  (!) 119/57 (!) 102/56  Pulse: 64     Resp: 20  16   Temp: (!) 97.5 F (36.4 C)  97.7 F (36.5 C)   TempSrc: Oral  Oral   SpO2: 97%  94%   Weight:  112.4 kg    Height:        General: Pt is alert, awake, not in acute distress Cardiovascular: RRR,  S1/S2 +, no rubs, no gallops Respiratory: CTA bilaterally, no wheezing, no rhonchi Abdominal: Soft, NT, ND, bowel sounds + Extremities: edema, no cyanosis    The results of significant diagnostics from this hospitalization (including imaging, microbiology, ancillary and laboratory) are listed below for reference.     Microbiology: Recent Results (from the past 240 hour(s))  SARS Coronavirus 2 (CEPHEID - Performed in [place] hospital lab), Hosp Order     Status: None   Collection Time: 08/25/18  9:25 PM  Specimen: Nasopharyngeal Swab  Result Value Ref Range Status   SARS Coronavirus 2 NEGATIVE NEGATIVE Final    Comment: (NOTE) If result is NEGATIVE SARS-CoV-2 target nucleic acids are NOT DETECTED. The SARS-CoV-2 RNA is generally detectable in upper and lower  respiratory specimens during the acute phase of infection. The lowest  concentration of SARS-CoV-2 viral copies this assay can detect is 250  copies / mL. A negative result does not preclude SARS-CoV-2 infection  and should not be used as the sole basis for treatment or other  patient management decisions.  A negative result may occur with  improper specimen collection / handling, submission of specimen other  than nasopharyngeal swab, presence of viral mutation(s) within the  areas targeted by this assay, and inadequate number of viral copies  (<250 copies / mL). A negative result must be combined with clinical  observations, patient history, and epidemiological information. If result is POSITIVE SARS-CoV-2 target nucleic acids are DETECTED. The SARS-CoV-2 RNA is generally detectable in upper and lower  respiratory specimens dur ing the acute phase of infection.  Positive  results are indicative of active infection with SARS-CoV-2.  Clinical  correlation with patient history and other diagnostic information is  necessary to determine patient infection status.  Positive results do  not rule out bacterial infection or  co-infection with other viruses. If result is PRESUMPTIVE POSTIVE SARS-CoV-2 nucleic acids MAY BE PRESENT.   A presumptive positive result was obtained on the submitted specimen  and confirmed on repeat testing.  While 2019 novel coronavirus  (SARS-CoV-2) nucleic acids may be present in the submitted sample  additional confirmatory testing may be necessary for epidemiological  and / or clinical management purposes  to differentiate between  SARS-CoV-2 and other Sarbecovirus currently known to infect humans.  If clinically indicated additional testing with an alternate test  methodology [phone]) is advised. The SARS-CoV-2 RNA is generally  detectable in upper and lower respiratory sp ecimens during the acute  phase of infection. The expected result is Negative. Fact Sheet for Patients:  StrictlyIdeas.no Fact Sheet for Healthcare Providers: BankingDealers.co.za This test is not yet approved or cleared by the [place] FDA and has been authorized for detection and/or diagnosis of SARS-CoV-2 by FDA under an Emergency Use Authorization (EUA).  This EUA will remain in effect (meaning this test can be used) for the duration of the COVID-19 declaration under Section 564(b)(1) of the Act, 21 U.S.C. section 360bbb-3(b)(1), unless the authorization is terminated or revoked sooner. Performed at [facility], [address] [place], [place] [address]      Labs: BNP (last 3 results) Recent Labs    08/25/18 1714  BNP 841.3*   Basic Metabolic Panel: Recent Labs  Lab 08/27/18 0423 08/28/18 0423 08/29/18 0418 08/30/18 0258 08/31/18 0606  NA 136 136 134* 135 136  K 4.7 3.9 3.3* 3.4* 3.5  CL 100 99 94* 90* 90*  CO2 23 24 27 30 29   GLUCOSE 163* 173* 160* 164* 167*  BUN 41* 50* 56* 67* 72*  CREATININE 3.75* 3.58* 4.07* 4.30* 4.13*  CALCIUM 9.1 9.2 9.2 9.7 9.9  MG  --   --   --  2.1  --   PHOS 5.6* 5.2* 5.0* 5.1* 5.3*   Liver Function  Tests: Recent Labs  Lab 08/25/18 1714 08/26/18 0350 08/27/18 0423 08/28/18 0423 08/29/18 0418 08/30/18 0258 08/31/18 0606  AST 20 18  --   --   --   --   --  ALT 14 13  --   --   --   --   --   ALKPHOS 69 64  --   --   --   --   --   BILITOT 0.6 0.6  --   --   --   --   --   PROT 6.7 6.7  --   --   --   --   --   ALBUMIN 3.4* 3.4* 3.4* 3.2* 3.1* 3.3* 3.4*   No results for input(s): LIPASE, AMYLASE in the last 168 hours. No results for input(s): AMMONIA in the last 168 hours. CBC: Recent Labs  Lab 08/25/18 1714 08/26/18 0350  WBC 8.3 8.9  NEUTROABS 5.3  --   HGB 10.8* 10.2*  HCT 37.6 36.0  MCV 91.3 91.1  PLT 269 277   Cardiac Enzymes: No results for input(s): CKTOTAL, CKMB, CKMBINDEX, TROPONINI in the last 168 hours. BNP: Invalid input(s): POCBNP CBG: Recent Labs  Lab 08/30/18 0804 08/30/18 1107 08/30/18 1612 08/30/18 2138 08/31/18 0739  GLUCAP 193* 158* 188* 228* 190*   D-Dimer No results for input(s): DDIMER in the last 72 hours. Hgb A1c No results for input(s): HGBA1C in the last 72 hours. Lipid Profile No results for input(s): CHOL, HDL, LDLCALC, TRIG, CHOLHDL, LDLDIRECT in the last 72 hours. Thyroid function studies No results for input(s): TSH, T4TOTAL, T3FREE, THYROIDAB in the last 72 hours.  Invalid input(s): FREET3 Anemia work up No results for input(s): VITAMINB12, FOLATE, FERRITIN, TIBC, IRON, RETICCTPCT in the last 72 hours. Urinalysis    Component Value Date/Time   COLORURINE YELLOW 10/19/2015 1420   APPEARANCEUR CLEAR 10/19/2015 1420   LABSPEC 1.010 10/19/2015 1420   PHURINE 6.0 10/19/2015 1420   GLUCOSEU NEGATIVE 10/19/2015 1420   HGBUR TRACE (A) 10/19/2015 1420   BILIRUBINUR NEGATIVE 10/19/2015 1420   KETONESUR NEGATIVE 10/19/2015 1420   PROTEINUR NEGATIVE 10/19/2015 1420   UROBILINOGEN 0.2 12/04/2014 0655   NITRITE NEGATIVE 10/19/2015 1420   LEUKOCYTESUR NEGATIVE 10/19/2015 1420   Sepsis Labs Invalid input(s): PROCALCITONIN,   WBC,  LACTICIDVEN Microbiology Recent Results (from the past 240 hour(s))  SARS Coronavirus 2 (CEPHEID - Performed in [facility] hospital lab), Hosp Order     Status: None   Collection Time: 08/25/18  9:25 PM   Specimen: Nasopharyngeal Swab  Result Value Ref Range Status   SARS Coronavirus 2 NEGATIVE NEGATIVE Final    Comment: (NOTE) If result is NEGATIVE SARS-CoV-2 target nucleic acids are NOT DETECTED. The SARS-CoV-2 RNA is generally detectable in upper and lower  respiratory specimens during the acute phase of infection. The lowest  concentration of SARS-CoV-2 viral copies this assay can detect is 250  copies / mL. A negative result does not preclude SARS-CoV-2 infection  and should not be used as the sole basis for treatment or other  patient management decisions.  A negative result may occur with  improper specimen collection / handling, submission of specimen other  than nasopharyngeal swab, presence of viral mutation(s) within the  areas targeted by this assay, and inadequate number of viral copies  (<250 copies / mL). A negative result must be combined with clinical  observations, patient history, and epidemiological information. If result is POSITIVE SARS-CoV-2 target nucleic acids are DETECTED. The SARS-CoV-2 RNA is generally detectable in upper and lower  respiratory specimens dur ing the acute phase of infection.  Positive  results are indicative of active infection with SARS-CoV-2.  Clinical  correlation with patient history and other  diagnostic information is  necessary to determine patient infection status.  Positive results do  not rule out bacterial infection or co-infection with other viruses. If result is PRESUMPTIVE POSTIVE SARS-CoV-2 nucleic acids MAY BE PRESENT.   A presumptive positive result was obtained on the submitted specimen  and confirmed on repeat testing.  While 2019 novel coronavirus  (SARS-CoV-2) nucleic acids may be present in the submitted  sample  additional confirmatory testing may be necessary for epidemiological  and / or clinical management purposes  to differentiate between  SARS-CoV-2 and other Sarbecovirus currently known to infect humans.  If clinically indicated additional testing with an alternate test  methodology [id number]) is advised. The SARS-CoV-2 RNA is generally  detectable in upper and lower respiratory sp ecimens during the acute  phase of infection. The expected result is Negative. Fact Sheet for Patients:  StrictlyIdeas.no Fact Sheet for Healthcare Providers: BankingDealers.co.za This test is not yet approved or cleared by the [place] FDA and has been authorized for detection and/or diagnosis of SARS-CoV-2 by FDA under an Emergency Use Authorization (EUA).  This EUA will remain in effect (meaning this test can be used) for the duration of the COVID-19 declaration under Section 564(b)(1) of the Act, 21 U.S.C. section 360bbb-3(b)(1), unless the authorization is terminated or revoked sooner. Performed at [facility], [address] [place], [place] [address]      Time coordinating discharge: 35 minutes  SIGNED:   Cordelia Poche, MD [facility] 08/31/2018, 10:42 AM

## 2018-08-31 NOTE — Progress Notes (Signed)
Patient ID: [name], female   DOB: Mar 16, 1940, 78 y.o.   MRN: [id number] [facility] Progress Note   Assessment/ Plan:   1. Acute kidney Injury on chronic kidney disease stage IV: Patient with solitary kidney and underlying chronic kidney disease from hypertension and likely cardiorenal syndrome.  Her acute renal insufficiency appears to be from CHF exacerbation/hemodynamic mechanism and likely will fluctuate with efforts at increased diuresis/volume unloading for symptomatic improvement.  She is status post first stage brachiobasilic fistula by Dr. Donzetta Matters on 08/02/2018.  Have been successful with diuresis but with not unexpected inc in creatinine but not uremic.  dec metolazone quite a bit from 5 BID to 2.5 daily and convert iv bumex to PO torsemide that she takes at home.  She is not uremic so may be able to be discharged on this regimen with daily weights and follow up with Dr. Joelyn Oms  2.  Acute exacerbation of congestive heart failure: good response to intravenous bumetanide plus metolazone overnight,  Back off a little today.  Seen earlier by cardiology who have signed off.  3.  Anemia: Likely secondary to chronic kidney disease, will send off iron studies. Had "reaction " to IV iron-  hgb over 10- no ESA yet 4.  Secondary hyperparathyroidism: Calcium level within acceptable range, hyperphosphatemia noted-we will continue to trend this to decide need for binder. Not yet  5.  Chronic atrial fibrillation: Appears rate controlled on diltiazem/metoprolol/amiodarone and on anticoagulation with Eliquis. 6. Hypokalemia- stable but may need repletion as OP- will choose 30 meq today    From my standpoint have achieved a negative fluid balance on metolazone 2.5 daily and torsemide 40 BID and crt is stable so I would be comfortable with discharge home - daily weights and I will arrange follow up with [facility]   Subjective:     Almost 3 liters of UOP which is great  crt dec.    Overall feels better with weight down 13 pounds since admit - wants to  go home.  Is tearful today "too many doctors"      Objective:   BP (!) 102/56 (BP Location: Right Arm)   Pulse 64   Temp 97.7 F (36.5 C) (Oral)   Resp 16   Ht 5\' 2"  (1.575 m)   Wt 112.4 kg   SpO2 94%   BMI 45.32 kg/m   Intake/Output Summary (Last 24 hours) at 08/31/2018 0957 Last data filed at 08/31/2018 0802 Gross per 24 hour  Intake 786 ml  Output 3350 ml  Net -2564 ml   Weight change: -2.268 kg  Physical Exam: Gen: Comfortably sitting up on the side of her bed, on oxygen via nasal cannula.  Speaks complete sentences- morbidly obese CVS: Pulse irregularly irregular, normal rate, S1 and S2 normal Resp: Diminished breath sounds over bases, no distinct rales or rhonchi Abd: Soft, obese, nontender Ext: 1-2+ pitting lower extremity edema Left upper arm AVF with thrill  Imaging: No results found.  Labs: BMET Recent Labs  Lab 08/25/18 1714 08/26/18 0350 08/27/18 0423 08/28/18 0423 08/29/18 0418 08/30/18 0258 08/31/18 0606  NA 138 140 136 136 134* 135 136  K 3.9 4.0 4.7 3.9 3.3* 3.4* 3.5  CL 103 102 100 99 94* 90* 90*  CO2 27 27 23 24 27 30 29   GLUCOSE 99 83 163* 173* 160* 164* 167*  BUN 34* 35* 41* 50* 56* 67* 72*  CREATININE 3.38* 3.38* 3.75* 3.58* 4.07* 4.30* 4.13*  CALCIUM 8.8*  8.9 9.1 9.2 9.2 9.7 9.9  PHOS  --   --  5.6* 5.2* 5.0* 5.1* 5.3*   CBC Recent Labs  Lab 08/25/18 1714 08/26/18 0350  WBC 8.3 8.9  NEUTROABS 5.3  --   HGB 10.8* 10.2*  HCT 37.6 36.0  MCV 91.3 91.1  PLT 269 277    Medications:    . amiodarone  200 mg Oral Daily  . apixaban  5 mg Oral BID  . diltiazem  120 mg Oral Daily  . estradiol  1 mg Oral Daily  . famotidine  20 mg Oral Daily  . insulin aspart  0-9 Units Subcutaneous TID WC  . levothyroxine  137 mcg Oral QAC breakfast  . magnesium oxide  200 mg Oral QODAY  . metolazone  2.5 mg Oral Q24H  . metoprolol tartrate  12.5 mg Oral BID  . polyethylene  glycol  17 g Oral Daily  . sodium chloride flush  3 mL Intravenous Q12H  . torsemide  40 mg Oral BID   Louis Meckel  08/31/2018, 9:57 AM

## 2018-08-31 NOTE — TOC Transition Note (Addendum)
Transition of Care [facility]) - CM/SW Discharge Note   Patient Details  Name: [name] MRN: [id number] Date of Birth: 03/02/1940  Transition of Care [facility]) CM/SW Contact:  Pollie Friar, RN Phone Number: 08/31/2018, 12:03 PM   Clinical Narrative:    Pt discharging home with [facility] services. Cory with [facility] accepted the referral. Has Rollator, shower chair and 3 in 1 at home. Pt uses home oxygen 2-4 L. She doesn't remember the name of oxygen company. Pt states her transportation home will bring a tank of oxygen.  Family to provide transport home.   Final next level of care: Home w Home Health Services Barriers to Discharge: No Barriers Identified   Patient Goals and CMS Choice   CMS Medicare.gov Compare Post Acute Care list provided to:: Patient Choice offered to / list presented to : Patient  Discharge Placement                       Discharge Plan and Services                          HH Arranged: PT [facility] Agency: [place] Date [place]: 08/31/18   Representative spoke with at [place]: [place] ([employer]) Interventions     Readmission Risk Interventions No flowsheet data found.

## 2018-09-07 ENCOUNTER — Other Ambulatory Visit: Payer: Self-pay

## 2018-09-07 DIAGNOSIS — N183 Chronic kidney disease, stage 3 unspecified: Secondary | ICD-10-CM

## 2018-09-16 ENCOUNTER — Ambulatory Visit (HOSPITAL_COMMUNITY)
Admission: RE | Admit: 2018-09-16 | Discharge: 2018-09-16 | Disposition: A | Discharge status: Home / Self Care | Payer: Medicare HMO | Source: Ambulatory Visit | Attending: Vascular Surgery | Admitting: Vascular Surgery

## 2018-09-16 ENCOUNTER — Ambulatory Visit (INDEPENDENT_AMBULATORY_CARE_PROVIDER_SITE_OTHER): Payer: Self-pay | Admitting: Vascular Surgery

## 2018-09-16 ENCOUNTER — Encounter: Payer: Self-pay | Admitting: Vascular Surgery

## 2018-09-16 ENCOUNTER — Other Ambulatory Visit: Payer: Self-pay

## 2018-09-16 ENCOUNTER — Other Ambulatory Visit: Payer: Self-pay | Admitting: *Deleted

## 2018-09-16 VITALS — BP 123/68 | HR 102 | Temp 97.3°F | Resp 14 | Ht 62.0 in | Wt 228.0 lb

## 2018-09-16 DIAGNOSIS — N186 End stage renal disease: Secondary | ICD-10-CM

## 2018-09-16 DIAGNOSIS — N183 Chronic kidney disease, stage 3 unspecified: Secondary | ICD-10-CM
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
## 2018-09-21 DIAGNOSIS — N2581 Secondary hyperparathyroidism of renal origin: Secondary | ICD-10-CM

## 2018-09-21 HISTORY — DX: Secondary hyperparathyroidism of renal origin: N25.81

## 2018-09-23 ENCOUNTER — Telehealth (HOSPITAL_COMMUNITY): Payer: Self-pay | Admitting: *Deleted

## 2018-09-23 NOTE — Telephone Encounter (Signed)
[name] called stating patient needs permission from her cardiologist to have hepatits B shot for dialysis. Please call the HD center - [facility] on [facility] at [phone] with recommendations of whether patient can receive Hepatitis B vaccine or not.

## 2018-09-23 NOTE — Telephone Encounter (Signed)
Spoke to [employer] Dr.Jordan's advice given.

## 2018-09-23 NOTE — Telephone Encounter (Signed)
OK from cardiac standpoint to Hep B vaccine  Peter [name] MD, [facility]

## 2018-09-28 ENCOUNTER — Other Ambulatory Visit (HOSPITAL_COMMUNITY): Payer: Self-pay | Admitting: *Deleted

## 2018-09-28 DIAGNOSIS — N186 End stage renal disease: Secondary | ICD-10-CM

## 2018-10-04 ENCOUNTER — Encounter (HOSPITAL_COMMUNITY): Payer: Self-pay | Admitting: *Deleted

## 2018-10-04 NOTE — Progress Notes (Addendum)
Spoke with son-in-law for preop phone call d/t patient's dementia. Below instructions given regarding DM management. Per son-in-law patient will need daughter to be at bedside d/t dementia. He reports that she is unable to recall new information after about 2 minutes.       How do I manage my blood sugar before surgery? . Check your blood sugar at least 4 times a day, starting 2 days before surgery, to make sure that the level is not too high or low. o Check your blood sugar the morning of your surgery when you wake up and every 2 hours until you get to the Short Stay unit. . If your blood sugar is less than 70 mg/dL, you will need to treat for low blood sugar: o Do not take insulin. o Treat a low blood sugar (less than 70 mg/dL) with  cup of clear juice (cranberry or apple), 4 glucose tablets, OR glucose gel. Recheck blood sugar in 15 minutes after treatment (to make sure it is greater than 70 mg/dL). If your blood sugar is not greater than 70 mg/dL on recheck, call [phone] o  for further instructions. . Report your blood sugar to the short stay nurse when you get to Short Stay.  . If you are admitted to the hospital after surgery: o Your blood sugar will be checked by the staff and you will probably be given insulin after surgery (instead of oral diabetes medicines) to make sure you have good blood sugar levels. o The goal for blood sugar control after surgery is 80-180 mg/dL.              WHAT DO I DO ABOUT MY DIABETES MEDICATION?   Marland Kitchen Do not take oral diabetes medicines (pills) the morning of surgery.  . THE NIGHT BEFORE SURGERY, take _1/2________ dose of __Lantus_________insulin.

## 2018-10-04 NOTE — Anesthesia Preprocedure Evaluation (Addendum)
Anesthesia Evaluation  Patient identified by MRN, date of birth, ID band Patient awake    Reviewed: Allergy & Precautions, H&P , NPO status , Patient's Chart, lab work & pertinent test results  History of Anesthesia Complications (+) PONV, PROLONGED EMERGENCE and history of anesthetic complications  Airway Mallampati: II  TM Distance: <3 FB Neck ROM: Full    Dental  (+) Edentulous Upper, Edentulous Lower   Pulmonary asthma , sleep apnea and Oxygen sleep apnea , COPD (3L O2 continuous),  oxygen dependent,     + decreased breath sounds      Cardiovascular hypertension, + CAD, + Past MI and +CHF  + dysrhythmias Atrial Fibrillation  Rhythm:Regular Rate:Normal   '18 TTE - EF 45-50%, Trivial MR and TR. Diastolic dysfunction mentioned on prior echo reports (not commented on in 2018)    Neuro/Psych PSYCHIATRIC DISORDERS Anxiety Depression Dementia  Bell's palsy     GI/Hepatic Neg liver ROS, hiatal hernia, GERD  Medicated and Controlled, Gastroparesis IBS    Endo/Other  diabetes, Type 2, Insulin Dependent, Oral Hypoglycemic AgentsHypothyroidism Morbid obesity  Renal/GU ESRF and DialysisRenal disease S/p nephrectomy      Musculoskeletal  (+) Arthritis ,  Gout    Abdominal (+) + obese,   Peds  Hematology negative hematology ROS (+)   Anesthesia Other Findings   Reproductive/Obstetrics                         Anesthesia Physical Anesthesia Plan  ASA: IV  Anesthesia Plan: General   Post-op Pain Management:    Induction: Intravenous  PONV Risk Score and Plan: 4 or greater and Treatment may vary due to age or medical condition, Ondansetron and Propofol infusion  Airway Management Planned: LMA  Additional Equipment: None  Intra-op Plan:   Post-operative Plan: Extubation in OR  Informed Consent: I have reviewed the patients History and Physical, chart, labs and discussed the procedure  including the risks, benefits and alternatives for the proposed anesthesia with the patient or authorized representative who has indicated his/her understanding and acceptance.     Dental Advisory Given and Dental advisory given  Plan Discussed with: CRNA and Anesthesiologist  Anesthesia Plan Comments: (LMA supreme)    Anesthesia Quick Evaluation

## 2018-10-05 ENCOUNTER — Ambulatory Visit (HOSPITAL_COMMUNITY): Payer: Medicare HMO | Admitting: Physician Assistant

## 2018-10-05 ENCOUNTER — Encounter (HOSPITAL_COMMUNITY): Payer: Self-pay | Admitting: Anesthesiology

## 2018-10-05 ENCOUNTER — Other Ambulatory Visit: Payer: Self-pay

## 2018-10-05 ENCOUNTER — Ambulatory Visit (HOSPITAL_COMMUNITY)
Admission: RE | Admit: 2018-10-05 | Discharge: 2018-10-05 | Disposition: A | Discharge status: Home / Self Care | Payer: Medicare HMO | Attending: Vascular Surgery | Admitting: Vascular Surgery

## 2018-10-05 ENCOUNTER — Encounter (HOSPITAL_COMMUNITY)
Admission: RE | Disposition: A | Discharge status: Home / Self Care | Payer: Self-pay | Source: Home / Self Care | Attending: Vascular Surgery

## 2018-10-05 DIAGNOSIS — E1140 Type 2 diabetes mellitus with diabetic neuropathy, unspecified: Secondary | ICD-10-CM | POA: Insufficient documentation

## 2018-10-05 DIAGNOSIS — I252 Old myocardial infarction: Secondary | ICD-10-CM | POA: Diagnosis not present

## 2018-10-05 DIAGNOSIS — I2510 Atherosclerotic heart disease of native coronary artery without angina pectoris: Secondary | ICD-10-CM | POA: Insufficient documentation

## 2018-10-05 DIAGNOSIS — K219 Gastro-esophageal reflux disease without esophagitis: Secondary | ICD-10-CM | POA: Insufficient documentation

## 2018-10-05 DIAGNOSIS — E039 Hypothyroidism, unspecified: Secondary | ICD-10-CM | POA: Diagnosis not present

## 2018-10-05 DIAGNOSIS — I5032 Chronic diastolic (congestive) heart failure: Secondary | ICD-10-CM | POA: Insufficient documentation

## 2018-10-05 DIAGNOSIS — J449 Chronic obstructive pulmonary disease, unspecified: Secondary | ICD-10-CM | POA: Insufficient documentation

## 2018-10-05 DIAGNOSIS — Z7901 Long term (current) use of anticoagulants: Secondary | ICD-10-CM | POA: Insufficient documentation

## 2018-10-05 DIAGNOSIS — Z794 Long term (current) use of insulin: Secondary | ICD-10-CM | POA: Diagnosis not present

## 2018-10-05 DIAGNOSIS — E1122 Type 2 diabetes mellitus with diabetic chronic kidney disease: Secondary | ICD-10-CM | POA: Diagnosis not present

## 2018-10-05 DIAGNOSIS — K3184 Gastroparesis: Secondary | ICD-10-CM | POA: Diagnosis not present

## 2018-10-05 DIAGNOSIS — Z9981 Dependence on supplemental oxygen: Secondary | ICD-10-CM | POA: Insufficient documentation

## 2018-10-05 DIAGNOSIS — I132 Hypertensive heart and chronic kidney disease with heart failure and with stage 5 chronic kidney disease, or end stage renal disease: Secondary | ICD-10-CM | POA: Insufficient documentation

## 2018-10-05 DIAGNOSIS — Z79899 Other long term (current) drug therapy: Secondary | ICD-10-CM | POA: Diagnosis not present

## 2018-10-05 DIAGNOSIS — I480 Paroxysmal atrial fibrillation: Secondary | ICD-10-CM | POA: Insufficient documentation

## 2018-10-05 DIAGNOSIS — N185 Chronic kidney disease, stage 5: Secondary | ICD-10-CM | POA: Diagnosis not present

## 2018-10-05 DIAGNOSIS — G4730 Sleep apnea, unspecified: Secondary | ICD-10-CM | POA: Diagnosis not present

## 2018-10-05 DIAGNOSIS — Z79890 Hormone replacement therapy (postmenopausal): Secondary | ICD-10-CM | POA: Insufficient documentation

## 2018-10-05 DIAGNOSIS — E1143 Type 2 diabetes mellitus with diabetic autonomic (poly)neuropathy: Secondary | ICD-10-CM | POA: Diagnosis not present

## 2018-10-05 DIAGNOSIS — Z992 Dependence on renal dialysis: Secondary | ICD-10-CM | POA: Diagnosis not present

## 2018-10-05 DIAGNOSIS — N186 End stage renal disease: Secondary | ICD-10-CM

## 2018-10-05 DIAGNOSIS — F0390 Unspecified dementia without behavioral disturbance: Secondary | ICD-10-CM | POA: Insufficient documentation

## 2018-10-05 DIAGNOSIS — Z905 Acquired absence of kidney: Secondary | ICD-10-CM | POA: Insufficient documentation

## 2018-10-05 HISTORY — DX: End stage renal disease: N18.6

## 2018-10-05 HISTORY — PX: BASCILIC VEIN TRANSPOSITION: SHX5742

## 2018-10-05 HISTORY — DX: Unspecified dementia, unspecified severity, without behavioral disturbance, psychotic disturbance, mood disturbance, and anxiety: F03.90

## 2018-10-05 LAB — POCT I-STAT 4, (NA,K, GLUC, HGB,HCT)
Glucose, Bld: 131 mg/dL — ABNORMAL HIGH (ref 70–99)
HCT: 40 % (ref 36.0–46.0)
Hemoglobin: 13.6 g/dL (ref 12.0–15.0)
Potassium: 3.8 mmol/L (ref 3.5–5.1)
Sodium: 134 mmol/L — ABNORMAL LOW (ref 135–145)

## 2018-10-05 LAB — GLUCOSE, CAPILLARY
Glucose-Capillary: 145 mg/dL — ABNORMAL HIGH (ref 70–99)
Glucose-Capillary: 111 mg/dL — ABNORMAL HIGH (ref 70–99)

## 2018-10-05 LAB — SARS CORONAVIRUS 2 BY RT PCR (HOSPITAL ORDER, PERFORMED IN ~~LOC~~ HOSPITAL LAB): SARS Coronavirus 2: NEGATIVE

## 2018-10-05 LAB — PROTIME-INR
INR: 1.2 (ref 0.8–1.2)
Prothrombin Time: 15.5 seconds — ABNORMAL HIGH (ref 11.4–15.2)

## 2018-10-05 SURGERY — TRANSPOSITION, VEIN, BASILIC
Anesthesia: General | Site: Arm Upper | Laterality: Left

## 2018-10-05 MED ORDER — TRAMADOL HCL 50 MG PO TABS: 50.0000 mg | ORAL_TABLET | Freq: Four times a day (QID) | ORAL | 0 refills | Status: DC | PRN

## 2018-10-05 MED ORDER — TRAMADOL HCL 50 MG PO TABS
ORAL_TABLET | ORAL | Status: AC
  Administered 2018-10-05: 50 mg via ORAL
  Filled 2018-10-05: qty 1

## 2018-10-05 MED ORDER — 0.9 % SODIUM CHLORIDE (POUR BTL) OPTIME
TOPICAL | Status: DC | PRN
  Administered 2018-10-05: 10:00:00 1000 mL

## 2018-10-05 MED ORDER — ONDANSETRON HCL 4 MG/2ML IJ SOLN
INTRAMUSCULAR | Status: DC | PRN
  Administered 2018-10-05 (×2): 4 mg via INTRAVENOUS

## 2018-10-05 MED ORDER — FENTANYL CITRATE (PF) 250 MCG/5ML IJ SOLN
INTRAMUSCULAR | Status: AC
  Filled 2018-10-05: qty 5

## 2018-10-05 MED ORDER — FENTANYL CITRATE (PF) 100 MCG/2ML IJ SOLN
INTRAMUSCULAR | Status: DC | PRN
  Administered 2018-10-05 (×4): 25 ug via INTRAVENOUS

## 2018-10-05 MED ORDER — ALBUMIN HUMAN 5 % IV SOLN
INTRAVENOUS | Status: AC
  Administered 2018-10-05: 12.5 g
  Filled 2018-10-05: qty 250

## 2018-10-05 MED ORDER — ONDANSETRON HCL 4 MG/2ML IJ SOLN: 4.0000 mg | Freq: Once | INTRAMUSCULAR | Status: DC | PRN

## 2018-10-05 MED ORDER — SODIUM CHLORIDE 0.9 % IV SOLN
INTRAVENOUS | Status: DC | PRN
  Administered 2018-10-05: 500 mL

## 2018-10-05 MED ORDER — TRAMADOL HCL 50 MG PO TABS
50.0000 mg | ORAL_TABLET | Freq: Once | ORAL | Status: AC
  Administered 2018-10-05: 14:00:00 50 mg via ORAL

## 2018-10-05 MED ORDER — METOCLOPRAMIDE HCL 5 MG/ML IJ SOLN
10.0000 mg | Freq: Once | INTRAMUSCULAR | Status: AC
  Administered 2018-10-05: 15:00:00 10 mg via INTRAVENOUS

## 2018-10-05 MED ORDER — SODIUM CHLORIDE 0.9 % IV SOLN
INTRAVENOUS | Status: DC | PRN
  Administered 2018-10-05: 50 ug/min via INTRAVENOUS

## 2018-10-05 MED ORDER — FENTANYL CITRATE (PF) 100 MCG/2ML IJ SOLN
25.0000 ug | INTRAMUSCULAR | Status: DC | PRN
  Administered 2018-10-05: 25 ug via INTRAVENOUS

## 2018-10-05 MED ORDER — LIDOCAINE-EPINEPHRINE (PF) 1 %-1:200000 IJ SOLN
INTRAMUSCULAR | Status: AC
  Filled 2018-10-05: qty 30

## 2018-10-05 MED ORDER — PROPOFOL 10 MG/ML IV BOLUS
INTRAVENOUS | Status: AC
  Filled 2018-10-05: qty 20

## 2018-10-05 MED ORDER — LIDOCAINE 2% (20 MG/ML) 5 ML SYRINGE
INTRAMUSCULAR | Status: DC | PRN
  Administered 2018-10-05: 60 mg via INTRAVENOUS

## 2018-10-05 MED ORDER — METOCLOPRAMIDE HCL 5 MG/ML IJ SOLN
INTRAMUSCULAR | Status: AC
  Administered 2018-10-05: 10 mg via INTRAVENOUS
  Filled 2018-10-05: qty 2

## 2018-10-05 MED ORDER — SODIUM CHLORIDE 0.9 % IV SOLN
INTRAVENOUS | Status: AC
  Filled 2018-10-05: qty 1.2

## 2018-10-05 MED ORDER — FENTANYL CITRATE (PF) 100 MCG/2ML IJ SOLN
INTRAMUSCULAR | Status: AC
  Filled 2018-10-05: qty 2

## 2018-10-05 MED ORDER — VANCOMYCIN HCL IN DEXTROSE 1-5 GM/200ML-% IV SOLN
INTRAVENOUS | Status: AC
  Filled 2018-10-05: qty 200

## 2018-10-05 MED ORDER — ALBUMIN HUMAN 5 % IV SOLN: 12.5000 g | INTRAVENOUS | Status: DC

## 2018-10-05 MED ORDER — SODIUM CHLORIDE 0.9 % IV SOLN
INTRAVENOUS | Status: DC
  Administered 2018-10-05 (×2): via INTRAVENOUS

## 2018-10-05 MED ORDER — VANCOMYCIN HCL 1000 MG IV SOLR
INTRAVENOUS | Status: DC | PRN
  Administered 2018-10-05: 1000 mg via INTRAVENOUS

## 2018-10-05 MED ORDER — PHENYLEPHRINE HCL (PRESSORS) 10 MG/ML IV SOLN
INTRAVENOUS | Status: DC | PRN
  Administered 2018-10-05 (×3): 80 ug via INTRAVENOUS

## 2018-10-05 MED ORDER — METOCLOPRAMIDE HCL 5 MG/ML IJ SOLN: 5.0000 mg | Freq: Once | INTRAMUSCULAR | Status: DC

## 2018-10-05 MED ORDER — PROPOFOL 10 MG/ML IV BOLUS
INTRAVENOUS | Status: DC | PRN
  Administered 2018-10-05: 100 mg via INTRAVENOUS

## 2018-10-05 SURGICAL SUPPLY — 40 items
ADH SKN CLS APL DERMABOND .7 (GAUZE/BANDAGES/DRESSINGS) ×1
ARMBAND PINK RESTRICT EXTREMIT (MISCELLANEOUS) ×3 IMPLANT
CANISTER SUCT 3000ML PPV (MISCELLANEOUS) ×3 IMPLANT
CLIP VESOCCLUDE MED 24/CT (CLIP) ×2 IMPLANT
CLIP VESOCCLUDE MED 6/CT (CLIP) IMPLANT
CLIP VESOCCLUDE SM WIDE 24/CT (CLIP) ×2 IMPLANT
CLIP VESOCCLUDE SM WIDE 6/CT (CLIP) IMPLANT
COVER PROBE W GEL 5X96 (DRAPES) ×3 IMPLANT
COVER WAND RF STERILE (DRAPES) ×3 IMPLANT
DERMABOND ADVANCED (GAUZE/BANDAGES/DRESSINGS) ×2
DERMABOND ADVANCED .7 DNX12 (GAUZE/BANDAGES/DRESSINGS) ×1 IMPLANT
ELECT REM PT RETURN 9FT ADLT (ELECTROSURGICAL) ×3
ELECTRODE REM PT RTRN 9FT ADLT (ELECTROSURGICAL) ×1 IMPLANT
GLOVE BIO SURGEON STRL SZ7.5 (GLOVE) ×1 IMPLANT
GLOVE BIOGEL PI IND STRL 6.5 (GLOVE) IMPLANT
GLOVE BIOGEL PI IND STRL 7.0 (GLOVE) IMPLANT
GLOVE BIOGEL PI INDICATOR 6.5 (GLOVE) ×12
GLOVE BIOGEL PI INDICATOR 7.0 (GLOVE) ×2
GLOVE SURG SS PI 7.0 STRL IVOR (GLOVE) ×2 IMPLANT
GOWN STRL REUS W/ TWL LRG LVL3 (GOWN DISPOSABLE) ×2 IMPLANT
GOWN STRL REUS W/ TWL XL LVL3 (GOWN DISPOSABLE) ×1 IMPLANT
GOWN STRL REUS W/TWL LRG LVL3 (GOWN DISPOSABLE) ×9
GOWN STRL REUS W/TWL XL LVL3 (GOWN DISPOSABLE) ×3
GRAFT GORETEX STRT 4-7X45 (Vascular Products) ×2 IMPLANT
KIT BASIN OR (CUSTOM PROCEDURE TRAY) ×3 IMPLANT
KIT TURNOVER KIT B (KITS) ×3 IMPLANT
NS IRRIG 1000ML POUR BTL (IV SOLUTION) ×3 IMPLANT
PACK CV ACCESS (CUSTOM PROCEDURE TRAY) ×3 IMPLANT
PAD ARMBOARD 7.5X6 YLW CONV (MISCELLANEOUS) ×6 IMPLANT
SPONGE LAP 18X18 X RAY DECT (DISPOSABLE) ×4 IMPLANT
SUT MNCRL AB 4-0 PS2 18 (SUTURE) ×5 IMPLANT
SUT PROLENE 5 0 C 1 24 (SUTURE) ×20 IMPLANT
SUT PROLENE 6 0 BV (SUTURE) ×7 IMPLANT
SUT SILK 2 0 SH (SUTURE) IMPLANT
SUT VIC AB 3-0 SH 27 (SUTURE) ×6
SUT VIC AB 3-0 SH 27X BRD (SUTURE) ×1 IMPLANT
SUT VIC AB 4-0 PS2 18 (SUTURE) ×2 IMPLANT
TOWEL GREEN STERILE (TOWEL DISPOSABLE) ×3 IMPLANT
UNDERPAD 30X30 (UNDERPADS AND DIAPERS) ×3 IMPLANT
WATER STERILE IRR 1000ML POUR (IV SOLUTION) ×3 IMPLANT

## 2018-10-05 NOTE — Discharge Instructions (Signed)
° °  [facility]  Discharge Instructions  AV Fistula or Graft Surgery for Dialysis Access  Please refer to the following instructions for your post-procedure care. Your surgeon or physician assistant will discuss any changes with you.  Activity  You may drive the day following your surgery, if you are comfortable and no longer taking prescription pain medication. Resume full activity as the soreness in your incision resolves.  Bathing/Showering  You may shower after you go home. Keep your incision dry for 48 hours. Do not soak in a bathtub, hot tub, or swim until the incision heals completely. You may not shower if you have a hemodialysis catheter.  Incision Care  Clean your incision with mild soap and water after 48 hours. Pat the area dry with a clean towel. You do not need a bandage unless otherwise instructed. Do not apply any ointments or creams to your incision. You may have skin glue on your incision. Do not peel it off. It will come off on its own in about one week. Your arm may swell a bit after surgery. To reduce swelling use pillows to elevate your arm so it is above your heart. Your doctor will tell you if you need to lightly wrap your arm with an ACE bandage.  Diet  Resume your normal diet. There are not special food restrictions following this procedure. In order to heal from your surgery, it is CRITICAL to get adequate nutrition. Your body requires vitamins, minerals, and protein. Vegetables are the best source of vitamins and minerals. Vegetables also provide the perfect balance of protein. Processed food has little nutritional value, so try to avoid this.  Medications  Resume taking all of your medications. If your incision is causing pain, you may take over-the counter pain relievers such as acetaminophen (Tylenol). If you were prescribed a stronger pain medication, please be aware these medications can cause nausea and constipation. Prevent  nausea by taking the medication with a snack or meal. Avoid constipation by drinking plenty of fluids and eating foods with high amount of fiber, such as fruits, vegetables, and grains.  Do not take Tylenol if you are taking prescription pain medications.  Follow up Your surgeon may want to see you in the office following your access surgery. If so, this will be arranged at the time of your surgery.  Please call us immediately for any of the following conditions:  Increased pain, redness, drainage (pus) from your incision site Fever of 101 degrees or higher Severe or worsening pain at your incision site Hand pain or numbness.  Reduce your risk of vascular disease:  Stop smoking. If you would like help, call [facility] at 1-800-QUIT-NOW 7757973925) or [place] at [place] your cholesterol Maintain a desired weight Control your diabetes Keep your blood pressure down  Dialysis  It will take several weeks to several months for your new dialysis access to be ready for use. Your surgeon will determine when it is okay to use it. Your nephrologist will continue to direct your dialysis. You can continue to use your Permcath until your new access is ready for use.   10/05/2018 LAURYN LIZARDI [id number] 1940/10/30  Surgeon(s): Waynetta Sandy, MD  Procedure(s): Insertion left upper arm AV graft  x Do not stick graft for 4 weeks    If you have any questions, please call the office at [phone].

## 2018-10-05 NOTE — H&P (Signed)
   History and Physical Update  The patient was interviewed and re-examined.  The patient's previous History and Physical has been reviewed and is unchanged from recent office visit. Plan for left arm second stage bvt.   Ryelle Ruvalcaba C. Donzetta Matters, MD [facility] [place] Office: [phone] Pager: [phone]   10/05/2018, 8:38 AM

## 2018-10-05 NOTE — Progress Notes (Signed)
Dr Donzetta Matters called re pain/numbness in l thumb and fingers 2/3 / weak grip  And inability to locate thrill/bruit /  / by time arrived thrill found, remains with weak grip, and numbness in fingers/ eval'd by dr Donzetta Matters, will monitor

## 2018-10-05 NOTE — Anesthesia Procedure Notes (Signed)
Procedure Name: LMA Insertion Date/Time: 10/05/2018 10:08 AM Performed by: Neldon Newport, CRNA Pre-anesthesia Checklist: Timeout performed, Patient being monitored, Suction available, Emergency Drugs available and Patient identified Patient Re-evaluated:Patient Re-evaluated prior to induction Oxygen Delivery Method: Circle system utilized Preoxygenation: Pre-oxygenation with 100% oxygen Induction Type: IV induction Ventilation: Mask ventilation without difficulty LMA: LMA with gastric port inserted LMA Size: 4.0 Number of attempts: 1 Placement Confirmation: breath sounds checked- equal and bilateral and positive ETCO2 Tube secured with: Tape Dental Injury: Teeth and Oropharynx as per pre-operative assessment

## 2018-10-05 NOTE — Transfer of Care (Signed)
Immediate Anesthesia Transfer of Care Note  Patient: [name]  Procedure(s) Performed: BASILIC VEIN TRANSPOSITION SECOND STAGE- Using 4-106mm STRETCH Goretex Vascular Graft (Left Arm Upper)  Patient Location: PACU  Anesthesia Type:General  Level of Consciousness: awake, alert  and oriented  Airway & Oxygen Therapy: Patient Spontanous Breathing and Patient connected to nasal cannula oxygen  Post-op Assessment: Report given to RN, Post -op Vital signs reviewed and stable and Patient moving all extremities X 4  Post vital signs: Reviewed and stable  Last Vitals:  Vitals Value Taken Time  BP 158/132 10/05/18 1234  Temp    Pulse 87 10/05/18 1235  Resp 20 10/05/18 1235  SpO2 97 % 10/05/18 1235  Vitals shown include unvalidated device data.  Last Pain:  Vitals:   10/05/18 0759  TempSrc:   PainSc: 5       Patients Stated Pain Goal: 5 (66/29/47 6546)  Complications: No apparent anesthesia complications

## 2018-10-05 NOTE — Anesthesia Postprocedure Evaluation (Signed)
Anesthesia Post Note  Patient: [name]  Procedure(s) Performed: BASILIC VEIN TRANSPOSITION SECOND STAGE- Using 4-44mm STRETCH Goretex Vascular Graft (Left Arm Upper)     Patient location during evaluation: PACU Anesthesia Type: General Level of consciousness: awake and alert Pain management: pain level controlled Vital Signs Assessment: post-procedure vital signs reviewed and stable Respiratory status: spontaneous breathing, nonlabored ventilation, respiratory function stable and patient connected to nasal cannula oxygen Cardiovascular status: blood pressure returned to baseline and stable Postop Assessment: no apparent nausea or vomiting Anesthetic complications: no    Last Vitals:  Vitals:   10/05/18 1320 10/05/18 1350  BP: 105/71   Pulse: 92   Resp: (!) 26   Temp:  36.6 C  SpO2: 96%     Last Pain:  Vitals:   10/05/18 1350  TempSrc:   PainSc: [place]

## 2018-10-05 NOTE — Op Note (Signed)
° ° °  Patient name: [name] MRN: [id number] DOB: 1940-03-23 Sex: female  10/05/2018 Pre-operative Diagnosis: End-stage renal disease Post-operative diagnosis:  Same Surgeon:  Erlene Quan C. Donzetta Matters, MD Assistant: Leontine Locket, PA Procedure Performed: 1.  Left arm second stage basilic vein fistula creation 2.  Left arm interposition the 7 mm PTFE graft  Indications: 78 year old female with end-stage renal disease on dialysis via catheter currently.  She is indicated for second stage basilic vein fistula.  Findings: The basilic vein was initially transposed.  Unfortunately a very thin-walled attempt to repair it caused further injury.  I attempted to transect the axillary portion sewed end-to-end but it again tore.  I then placed a interposition 7 mm PTFE graft from the vein near the groin antecubitum to the vein in the axilla.  At completion there is a very strong thrill graft   Procedure:  The patient was identified in the holding area and taken to the operating room where she was laid supine operative general anesthesia induced.  She was sterilely prepped and draped in the left upper extremity usual fashion antibiotics were minister and timeout was called.  We began using ultrasound to identify the vein apparently suitable size.  We made the incision above the acute resection of the vein.  We made 2 separate skip incisions.  We protected the nerve did identify 2 branches of the nerve throughout the course.  Branches were taken between clips and ties of the vein.  After the vein was fully mobilized we transected near the antecubitum after clamping it.  We marked of orientation flushed with heparinized saline total laterally.  We then sewed it end-to-end to the vein using antecubitum with 6-0 Prolene suture.  Upon completion did have good flow however there was an injury near the axilla placenta be repaired ultimately with pledgeted sutures and this did tighten down the lumen quite tight.  I elected to  then transected the vein in the axilla.  I clamped both ends after marking for orientation again.  Especially both has attempted a sentence and and six 5-0 Prolene suture.  I did use 2 separate sutures.  Unfortunately the vein continue to tear as it was very thin-walled.  I then pulled the veins transected again pointed out towards the antecubitum and re-tunneled it but did not have enough room.  Much of the vein appeared involved.  With this I then transected the post previous anastomoses.  A total of 7 mm PTFE graft laterally.  I spatulated in the axilla and sewed it end-to-end with 6-0 Prolene suture.  Completion right and left heparinized saline.  I brought down to the antecubitum transected to size and sewed end-to-end with 6-0 Prolene suture later.  Upon completion I have very good thrill in the graft.  Also had a radial artery signal at the wrist which did augment with compression of the graft.  I irrigated all my wounds obtain hemostasis and closed in layers with Vicryl Monocryl.  Dermabond placed to level the skin.  She was awakened anesthesia and tolerated procedure well without immediate complication.  All counts were correct at completion.  EBL: 200 cc   Rhapsody Wolven C. Donzetta Matters, MD [facility] [place] Office: [phone] Pager: [phone]

## 2018-10-06 ENCOUNTER — Encounter (HOSPITAL_COMMUNITY): Payer: Self-pay | Admitting: Vascular Surgery

## 2018-10-10 ENCOUNTER — Other Ambulatory Visit: Payer: Self-pay

## 2018-10-10 ENCOUNTER — Telehealth (HOSPITAL_COMMUNITY): Payer: Self-pay | Admitting: *Deleted

## 2018-10-10 DIAGNOSIS — I4819 Other persistent atrial fibrillation: Secondary | ICD-10-CM

## 2018-10-10 DIAGNOSIS — R200 Anesthesia of skin: Secondary | ICD-10-CM

## 2018-10-10 DIAGNOSIS — R202 Paresthesia of skin: Secondary | ICD-10-CM

## 2018-10-10 NOTE — Telephone Encounter (Signed)
Patient daughter called in stating pts heart rate has been in the 95-105 range not feeling well today. BP today 106/72. Discussed with Roderic Palau NP - will increase amiodarone to 200mg  a day and call back with report on Wed/Thurs. Pt daughter verbalized understanding.

## 2018-10-12 ENCOUNTER — Ambulatory Visit (INDEPENDENT_AMBULATORY_CARE_PROVIDER_SITE_OTHER): Payer: Medicare HMO | Admitting: Physician Assistant

## 2018-10-12 ENCOUNTER — Ambulatory Visit (HOSPITAL_COMMUNITY)
Admission: RE | Admit: 2018-10-12 | Discharge: 2018-10-12 | Disposition: A | Discharge status: Home / Self Care | Payer: Medicare HMO | Source: Ambulatory Visit | Attending: Vascular Surgery | Admitting: Vascular Surgery

## 2018-10-12 ENCOUNTER — Other Ambulatory Visit: Payer: Self-pay

## 2018-10-12 VITALS — BP 110/66 | HR 102 | Temp 97.8°F | Resp 14 | Ht 62.0 in | Wt 231.0 lb

## 2018-10-12 DIAGNOSIS — N186 End stage renal disease: Secondary | ICD-10-CM

## 2018-10-12 DIAGNOSIS — R200 Anesthesia of skin: Secondary | ICD-10-CM | POA: Insufficient documentation

## 2018-10-12 DIAGNOSIS — R202 Paresthesia of skin: Secondary | ICD-10-CM

## 2018-10-12 NOTE — Progress Notes (Signed)
POST OPERATIVE OFFICE NOTE    CC:  F/u for surgery  HPI:  This is a 78 year old female with end-stage renal disease on dialysis via catheter currently.  She is indicated for second stage basilic vein fistula.  The vein was not usable and she had an AV graft placed by Dr. Donzetta Matters on 10/05/2018.  She has numbness in the thumb and index fingers with painful swelling in the upper arm.  The fingers are cooler to touch.  She states these symptoms are slowly improving over the last 4 days.    Allergies  Allergen Reactions  . Adhesive [Tape] Itching, Swelling, Rash and Other (See Comments)    Tears skin and causes blisters also. EKG pads will cause welps.   . Avelox [Moxifloxacin] Swelling and Rash  . Blueberry Flavor Anaphylaxis  . Cefprozil Shortness Of Breath and Rash  . Cetacaine [Butamben-Tetracaine-Benzocaine] Nausea And Vomiting and Swelling  . Dicyclomine Nausea And Vomiting and Other (See Comments)    "Heart trouble"; Headaches and increased blood sugars "Heart trouble"; Headaches and increased blood sugars  . Food Anaphylaxis and Other (See Comments)    Melons, Bananas, Cantaloupes, Watermelon-throat closes up and blisters   . Imdur [Isosorbide Nitrate] Hives, Palpitations, Other (See Comments) and Rash    Headaches also  . Januvia [Sitagliptin] Shortness Of Breath  . Lipitor [Atorvastatin] Shortness Of Breath  . Losartan Potassium Shortness Of Breath  . Nitroglycerin Other (See Comments)    Caused cardiac arrest and feels like skin bring torn off back of head Caused cardiac arrest and feels like skin bring torn off back of head  . Oxycodone Hives and Rash    Tolerates Dilaudid Tolerates Dilaudid  . Penicillins Anaphylaxis    Has patient had a PCN reaction causing immediate rash, facial/tongue/throat swelling, SOB or lightheadedness with hypotension: Yes Has patient had a PCN reaction causing severe rash involving mucus membranes or skin necrosis: No Has patient had a PCN reaction  that required hospitalization Yes Has patient had a PCN reaction occurring within the last 10 years: No If all of the above answers are "NO", then may proceed with Cephalosporin use. Has patient had a PCN reaction causing immediate rash, facial/tongue/throat swelling, SOB or lightheadedness with hypotension: Yes Has patient had a PCN reaction causing severe rash involving mucus membranes or skin necrosis: No Has patient had a PCN reaction that required hospitalization Yes Has patient had a PCN reaction occurring within the last 10 years: No If all of the above answers are "NO", then may proceed with Cephalosporin use.   . Prednisone Anaphylaxis  . Vancomycin Anaphylaxis  . Hydrocodone Hives    Tolerates Dilaudid  . Latex Other (See Comments) and Rash    blisters  . Tamiflu [Oseltamivir] Other (See Comments)    Contraindicated with other medications Patient on tikosyn, and tamiflu interfered with anti arrhythmic med Contraindicated with other medications Patient on tikosyn, and tamiflu interfered with anti arrhythmic med  . Feraheme [Ferumoxytol] Other (See Comments)    Sharp pain to lower back and flank  . Zyrtec [Cetirizine] Other (See Comments)    unspecified  . Lasix [Furosemide] Hives, Swelling and Rash  . Mupirocin Rash    Current Outpatient Medications  Medication Sig Dispense Refill  . acetaminophen (TYLENOL) 500 MG tablet Take 0.5 tablets (250 mg total) by mouth every 6 (six) hours as needed for mild pain (pain). (Patient taking differently: Take 250 mg by mouth 2 (two) times daily as needed for mild pain (pain). )    .  amiodarone (PACERONE) 200 MG tablet Take 200 mg by mouth daily.     Marland Kitchen apixaban (ELIQUIS) 5 MG TABS tablet Take 1 tablet (5 mg total) by mouth 2 (two) times daily. 60 tablet 6  . B Complex-C-Folic Acid (RENAL VITAMIN) 0.8 MG TABS Take 0.8 mg by mouth daily.    . clonazePAM (KLONOPIN) 1 MG tablet Take 0.5-1 mg by mouth 3 (three) times daily as needed for  anxiety.     Marland Kitchen diltiazem (CARDIZEM CD) 120 MG 24 hr capsule TAKE ONE (1) CAPSULE EACH DAY (Patient taking differently: Take 120 mg by mouth daily. ) 90 capsule 1  . estradiol (ESTRACE) 1 MG tablet Take 1 mg by mouth daily.    . famotidine (PEPCID) 20 MG tablet Take 20 mg by mouth daily.     Marland Kitchen glipiZIDE (GLUCOTROL XL) 10 MG 24 hr tablet Take 10 mg by mouth daily.     . Insulin Glargine (LANTUS) 100 UNIT/ML Solostar Pen Inject 5-35 Units into the skin 2 (two) times daily. Before Lunch per sliding scale: Under 100= 20 units, 100-180= 25 units, 180-240= 30 units, Over 240= 35 units At 10pm per sliding Scale: Under 100= 5 units, 100-200= 10 units, Over 200= 15 units    . levothyroxine (SYNTHROID, LEVOTHROID) 137 MCG tablet Take 137 mcg by mouth daily before breakfast.    . metolazone (ZAROXOLYN) 2.5 MG tablet Take 1 tablet (2.5 mg total) by mouth daily. 30 tablet 0  . metoprolol tartrate (LOPRESSOR) 25 MG tablet TAKE 1/2 TABLET TWICE DAILY 90 tablet 3  . potassium chloride SA (K-DUR,KLOR-CON) 20 MEQ tablet TAKE 1/2 TABLET EVERY DAY 45 tablet 3  . promethazine (PHENERGAN) 25 MG tablet Take 25 mg by mouth every 6 (six) hours as needed for nausea or vomiting.    Marland Kitchen SYSTANE 0.4-0.3 % SOLN Place 1 drop into both eyes 3 (three) times daily as needed (DRY EYE RELIEF).    Marland Kitchen torsemide (DEMADEX) 20 MG tablet TAKE 2 TABLETS TWICE DAILY 360 tablet 0  . traMADol (ULTRAM) 50 MG tablet Take 1 tablet (50 mg total) by mouth every 6 (six) hours as needed. 15 tablet 0  . vitamin B-12 (CYANOCOBALAMIN) 500 MCG tablet Take 500 mcg by mouth daily.     No current facility-administered medications for this visit.      ROS:  See HPI  Physical Exam:    Incision:  Well healed incisions. Extremities:  Palpable radial pulse with doppler signals in ulnar and palmer arch.  Pulsatile/ thrill felt in the graft.   Findings: +------------------+-------------+----------+---------+----------+-------------+ Arteriovenous      Diameter (cm)Depth (cm)BranchingPSV (cm/s) Flow Volume  Graft                                                         (ml/min)    +------------------+-------------+----------+---------+----------+-------------+ Native Artery                                        216        1406      Inflow                                                                    +------------------+-------------+----------+---------+----------+-------------+  Arterial                                             571                  Anastomosis                                                               +------------------+-------------+----------+---------+----------+-------------+ just after                                           153                  anastomosis                                                               +------------------+-------------+----------+---------+----------+-------------+ mid graft                                            198                  +------------------+-------------+----------+---------+----------+-------------+ distal graft                                         222                  +------------------+-------------+----------+---------+----------+-------------+ venous anastomosis                                   300                  +------------------+-------------+----------+---------+----------+-------------+ venous outflow                                       269                  +------------------+-------------+----------+---------+----------+-------------+    Summary: Patent AVG. No thrombus or stenosis. There is perigraft fluid at the antecubital fossa/distal upper arm. Unable to compress graft due to pain. Distal radial artery is antegrade.   Assessment/Plan:  This is a 78 y.o. female who is s/p: Procedure Performed: 1.  Left arm second stage basilic vein fistula  creation 2.  Left arm interposition the 7 mm PTFE graft  Symptoms of steal to include radial nerve numbness, cool fingers, and UE and forearm edema.  The patient states her symptoms are tolerable and seem to be improving slowly with time.    She is willing to give  it more time to improve.  I suggested increase active use of the left UE such as a squeeze ball.  She will call if the symptoms are intolerable.  Otherwise she will follow up in 5 weeks for exam.     Roxy Horseman  [facility] [phone]  Clinic MD:  Oneida Alar

## 2018-10-13 DIAGNOSIS — D509 Iron deficiency anemia, unspecified: Secondary | ICD-10-CM | POA: Insufficient documentation

## 2018-10-13 HISTORY — DX: Iron deficiency anemia, unspecified: D50.9

## 2018-10-20 ENCOUNTER — Ambulatory Visit (HOSPITAL_COMMUNITY)
Admission: RE | Admit: 2018-10-20 | Discharge: 2018-10-20 | Disposition: A | Discharge status: Home / Self Care | Payer: Medicare HMO | Source: Ambulatory Visit | Attending: Nurse Practitioner | Admitting: Nurse Practitioner

## 2018-10-20 ENCOUNTER — Other Ambulatory Visit: Payer: Self-pay

## 2018-10-20 DIAGNOSIS — I4819 Other persistent atrial fibrillation: Secondary | ICD-10-CM

## 2018-10-20 NOTE — Telephone Encounter (Signed)
Allison Palau NP spoke with patients daughter [name] - pt still having HR right around 100. Having issues with hypotension on dialysis days. Will have pt wear zio patch for 7 days to assess heart rhythm. Zio patch mailed to pts home. Will follow up once monitor results known.

## 2018-11-02 ENCOUNTER — Ambulatory Visit: Payer: Medicare HMO

## 2018-11-02 NOTE — Progress Notes (Deleted)
POST OPERATIVE OFFICE NOTE    CC:  F/u for surgery  HPI:  This is a 78 y.o. female who is s/p  Procedure Performed: 1.  Left arm second stage basilic vein fistula creation 2.  Left arm interposition the 7 mm PTFE graft  She is here today for f/u visit incisional checks.  She denise pain, loss of sensation and loss of motor.  Allergies  Allergen Reactions  . Adhesive [Tape] Itching, Swelling, Rash and Other (See Comments)    Tears skin and causes blisters also. EKG pads will cause welps.   . Avelox [Moxifloxacin] Swelling and Rash  . Blueberry Flavor Anaphylaxis  . Cefprozil Shortness Of Breath and Rash  . Cetacaine [Butamben-Tetracaine-Benzocaine] Nausea And Vomiting and Swelling  . Dicyclomine Nausea And Vomiting and Other (See Comments)    "Heart trouble"; Headaches and increased blood sugars "Heart trouble"; Headaches and increased blood sugars  . Food Anaphylaxis and Other (See Comments)    Melons, Bananas, Cantaloupes, Watermelon-throat closes up and blisters   . Imdur [Isosorbide Nitrate] Hives, Palpitations, Other (See Comments) and Rash    Headaches also  . Januvia [Sitagliptin] Shortness Of Breath  . Lipitor [Atorvastatin] Shortness Of Breath  . Losartan Potassium Shortness Of Breath  . Nitroglycerin Other (See Comments)    Caused cardiac arrest and feels like skin bring torn off back of head Caused cardiac arrest and feels like skin bring torn off back of head  . Oxycodone Hives and Rash    Tolerates Dilaudid Tolerates Dilaudid  . Penicillins Anaphylaxis    Has patient had a PCN reaction causing immediate rash, facial/tongue/throat swelling, SOB or lightheadedness with hypotension: Yes Has patient had a PCN reaction causing severe rash involving mucus membranes or skin necrosis: No Has patient had a PCN reaction that required hospitalization Yes Has patient had a PCN reaction occurring within the last 10 years: No If all of the above answers are "NO", then may  proceed with Cephalosporin use. Has patient had a PCN reaction causing immediate rash, facial/tongue/throat swelling, SOB or lightheadedness with hypotension: Yes Has patient had a PCN reaction causing severe rash involving mucus membranes or skin necrosis: No Has patient had a PCN reaction that required hospitalization Yes Has patient had a PCN reaction occurring within the last 10 years: No If all of the above answers are "NO", then may proceed with Cephalosporin use.   . Prednisone Anaphylaxis  . Vancomycin Anaphylaxis  . Hydrocodone Hives    Tolerates Dilaudid  . Latex Other (See Comments) and Rash    blisters  . Tamiflu [Oseltamivir] Other (See Comments)    Contraindicated with other medications Patient on tikosyn, and tamiflu interfered with anti arrhythmic med Contraindicated with other medications Patient on tikosyn, and tamiflu interfered with anti arrhythmic med  . Feraheme [Ferumoxytol] Other (See Comments)    Sharp pain to lower back and flank  . Zyrtec [Cetirizine] Other (See Comments)    unspecified  . Lasix [Furosemide] Hives, Swelling and Rash  . Mupirocin Rash    Current Outpatient Medications  Medication Sig Dispense Refill  . acetaminophen (TYLENOL) 500 MG tablet Take 0.5 tablets (250 mg total) by mouth every 6 (six) hours as needed for mild pain (pain). (Patient taking differently: Take 250 mg by mouth 2 (two) times daily as needed for mild pain (pain). )    . amiodarone (PACERONE) 200 MG tablet Take 200 mg by mouth daily.     Marland Kitchen apixaban (ELIQUIS) 5 MG TABS tablet Take 1  tablet (5 mg total) by mouth 2 (two) times daily. 60 tablet 6  . B Complex-C-Folic Acid (RENAL VITAMIN) 0.8 MG TABS Take 0.8 mg by mouth daily.    . clonazePAM (KLONOPIN) 1 MG tablet Take 0.5-1 mg by mouth 3 (three) times daily as needed for anxiety.     Marland Kitchen diltiazem (CARDIZEM CD) 120 MG 24 hr capsule TAKE ONE (1) CAPSULE EACH DAY (Patient taking differently: Take 120 mg by mouth daily. ) 90  capsule 1  . estradiol (ESTRACE) 1 MG tablet Take 1 mg by mouth daily.    . famotidine (PEPCID) 20 MG tablet Take 20 mg by mouth daily.     Marland Kitchen glipiZIDE (GLUCOTROL XL) 10 MG 24 hr tablet Take 10 mg by mouth daily.     . Insulin Glargine (LANTUS) 100 UNIT/ML Solostar Pen Inject 5-35 Units into the skin 2 (two) times daily. Before Lunch per sliding scale: Under 100= 20 units, 100-180= 25 units, 180-240= 30 units, Over 240= 35 units At 10pm per sliding Scale: Under 100= 5 units, 100-200= 10 units, Over 200= 15 units    . levothyroxine (SYNTHROID, LEVOTHROID) 137 MCG tablet Take 137 mcg by mouth daily before breakfast.    . metolazone (ZAROXOLYN) 2.5 MG tablet Take 1 tablet (2.5 mg total) by mouth daily. 30 tablet 0  . metoprolol tartrate (LOPRESSOR) 25 MG tablet TAKE 1/2 TABLET TWICE DAILY 90 tablet 3  . potassium chloride SA (K-DUR,KLOR-CON) 20 MEQ tablet TAKE 1/2 TABLET EVERY DAY 45 tablet 3  . promethazine (PHENERGAN) 25 MG tablet Take 25 mg by mouth every 6 (six) hours as needed for nausea or vomiting.    Marland Kitchen SYSTANE 0.4-0.3 % SOLN Place 1 drop into both eyes 3 (three) times daily as needed (DRY EYE RELIEF).    Marland Kitchen torsemide (DEMADEX) 20 MG tablet TAKE 2 TABLETS TWICE DAILY 360 tablet 0  . traMADol (ULTRAM) 50 MG tablet Take 1 tablet (50 mg total) by mouth every 6 (six) hours as needed. 15 tablet 0  . vitamin B-12 (CYANOCOBALAMIN) 500 MCG tablet Take 500 mcg by mouth daily.     No current facility-administered medications for this visit.      ROS:  See HPI  Physical Exam:  ***  Incision:  *** Extremities:  *** Neuro: *** Abdomen:  ***  Assessment/Plan:  This is a 78 y.o. female who is s/p: ***  -***   Leontine Locket, PA-C [facility] [phone]  Clinic MD:  ***

## 2018-11-15 NOTE — Addendum Note (Signed)
Encounter addended by: Juluis Mire, RN on: 11/15/2018 11:35 AM  Actions taken: Imaging Exam ended

## 2018-11-18 HISTORY — DX: Other disorders of phosphorus metabolism: E83.39

## 2018-11-23 ENCOUNTER — Ambulatory Visit (HOSPITAL_COMMUNITY)
Admission: RE | Admit: 2018-11-23 | Discharge: 2018-11-23 | Disposition: A | Discharge status: Home / Self Care | Payer: Medicare HMO | Source: Ambulatory Visit | Attending: Nurse Practitioner | Admitting: Nurse Practitioner

## 2018-11-23 ENCOUNTER — Encounter (HOSPITAL_COMMUNITY): Payer: Self-pay | Admitting: Nurse Practitioner

## 2018-11-23 ENCOUNTER — Other Ambulatory Visit: Payer: Self-pay

## 2018-11-23 VITALS — BP 140/80 | HR 94 | Ht 62.0 in | Wt 231.2 lb

## 2018-11-23 DIAGNOSIS — Z8371 Family history of colonic polyps: Secondary | ICD-10-CM | POA: Insufficient documentation

## 2018-11-23 DIAGNOSIS — J45909 Unspecified asthma, uncomplicated: Secondary | ICD-10-CM | POA: Diagnosis not present

## 2018-11-23 DIAGNOSIS — E039 Hypothyroidism, unspecified: Secondary | ICD-10-CM | POA: Insufficient documentation

## 2018-11-23 DIAGNOSIS — Z800 Family history of malignant neoplasm of digestive organs: Secondary | ICD-10-CM | POA: Insufficient documentation

## 2018-11-23 DIAGNOSIS — Z794 Long term (current) use of insulin: Secondary | ICD-10-CM | POA: Insufficient documentation

## 2018-11-23 DIAGNOSIS — Z6841 Body Mass Index (BMI) 40.0 and over, adult: Secondary | ICD-10-CM | POA: Diagnosis not present

## 2018-11-23 DIAGNOSIS — Z888 Allergy status to other drugs, medicaments and biological substances status: Secondary | ICD-10-CM | POA: Insufficient documentation

## 2018-11-23 DIAGNOSIS — E1122 Type 2 diabetes mellitus with diabetic chronic kidney disease: Secondary | ICD-10-CM | POA: Diagnosis not present

## 2018-11-23 DIAGNOSIS — Z881 Allergy status to other antibiotic agents status: Secondary | ICD-10-CM | POA: Insufficient documentation

## 2018-11-23 DIAGNOSIS — F0390 Unspecified dementia without behavioral disturbance: Secondary | ICD-10-CM | POA: Diagnosis not present

## 2018-11-23 DIAGNOSIS — I252 Old myocardial infarction: Secondary | ICD-10-CM | POA: Insufficient documentation

## 2018-11-23 DIAGNOSIS — Z91018 Allergy to other foods: Secondary | ICD-10-CM | POA: Insufficient documentation

## 2018-11-23 DIAGNOSIS — K219 Gastro-esophageal reflux disease without esophagitis: Secondary | ICD-10-CM | POA: Insufficient documentation

## 2018-11-23 DIAGNOSIS — Z885 Allergy status to narcotic agent status: Secondary | ICD-10-CM | POA: Insufficient documentation

## 2018-11-23 DIAGNOSIS — Z8041 Family history of malignant neoplasm of ovary: Secondary | ICD-10-CM | POA: Insufficient documentation

## 2018-11-23 DIAGNOSIS — N186 End stage renal disease: Secondary | ICD-10-CM | POA: Diagnosis not present

## 2018-11-23 DIAGNOSIS — M5030 Other cervical disc degeneration, unspecified cervical region: Secondary | ICD-10-CM | POA: Diagnosis not present

## 2018-11-23 DIAGNOSIS — I428 Other cardiomyopathies: Secondary | ICD-10-CM | POA: Insufficient documentation

## 2018-11-23 DIAGNOSIS — M1990 Unspecified osteoarthritis, unspecified site: Secondary | ICD-10-CM | POA: Diagnosis not present

## 2018-11-23 DIAGNOSIS — R9431 Abnormal electrocardiogram [ECG] [EKG]: Secondary | ICD-10-CM | POA: Insufficient documentation

## 2018-11-23 DIAGNOSIS — G4730 Sleep apnea, unspecified: Secondary | ICD-10-CM | POA: Insufficient documentation

## 2018-11-23 DIAGNOSIS — Z79899 Other long term (current) drug therapy: Secondary | ICD-10-CM | POA: Insufficient documentation

## 2018-11-23 DIAGNOSIS — Z886 Allergy status to analgesic agent status: Secondary | ICD-10-CM | POA: Insufficient documentation

## 2018-11-23 DIAGNOSIS — Z7901 Long term (current) use of anticoagulants: Secondary | ICD-10-CM | POA: Diagnosis not present

## 2018-11-23 DIAGNOSIS — Z905 Acquired absence of kidney: Secondary | ICD-10-CM | POA: Insufficient documentation

## 2018-11-23 DIAGNOSIS — Z8249 Family history of ischemic heart disease and other diseases of the circulatory system: Secondary | ICD-10-CM | POA: Insufficient documentation

## 2018-11-23 DIAGNOSIS — Z833 Family history of diabetes mellitus: Secondary | ICD-10-CM | POA: Insufficient documentation

## 2018-11-23 DIAGNOSIS — Z9049 Acquired absence of other specified parts of digestive tract: Secondary | ICD-10-CM | POA: Diagnosis not present

## 2018-11-23 DIAGNOSIS — I4819 Other persistent atrial fibrillation: Secondary | ICD-10-CM | POA: Diagnosis not present

## 2018-11-23 DIAGNOSIS — Z882 Allergy status to sulfonamides status: Secondary | ICD-10-CM | POA: Insufficient documentation

## 2018-11-23 DIAGNOSIS — Z79890 Hormone replacement therapy (postmenopausal): Secondary | ICD-10-CM | POA: Insufficient documentation

## 2018-11-23 DIAGNOSIS — F419 Anxiety disorder, unspecified: Secondary | ICD-10-CM | POA: Insufficient documentation

## 2018-11-23 DIAGNOSIS — I480 Paroxysmal atrial fibrillation: Secondary | ICD-10-CM | POA: Insufficient documentation

## 2018-11-23 DIAGNOSIS — I5032 Chronic diastolic (congestive) heart failure: Secondary | ICD-10-CM | POA: Insufficient documentation

## 2018-11-23 DIAGNOSIS — I4892 Unspecified atrial flutter: Secondary | ICD-10-CM | POA: Diagnosis not present

## 2018-11-23 DIAGNOSIS — Z8051 Family history of malignant neoplasm of kidney: Secondary | ICD-10-CM | POA: Insufficient documentation

## 2018-11-23 DIAGNOSIS — F329 Major depressive disorder, single episode, unspecified: Secondary | ICD-10-CM | POA: Diagnosis not present

## 2018-11-23 DIAGNOSIS — E669 Obesity, unspecified: Secondary | ICD-10-CM | POA: Diagnosis not present

## 2018-11-23 DIAGNOSIS — Z880 Allergy status to penicillin: Secondary | ICD-10-CM | POA: Insufficient documentation

## 2018-11-23 DIAGNOSIS — I132 Hypertensive heart and chronic kidney disease with heart failure and with stage 5 chronic kidney disease, or end stage renal disease: Secondary | ICD-10-CM | POA: Insufficient documentation

## 2018-11-23 DIAGNOSIS — Z803 Family history of malignant neoplasm of breast: Secondary | ICD-10-CM | POA: Insufficient documentation

## 2018-11-23 DIAGNOSIS — Z883 Allergy status to other anti-infective agents status: Secondary | ICD-10-CM | POA: Insufficient documentation
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
## 2019-01-09 ENCOUNTER — Other Ambulatory Visit (HOSPITAL_COMMUNITY): Payer: Self-pay | Admitting: *Deleted

## 2019-01-09 MED ORDER — APIXABAN 5 MG PO TABS: 5.0000 mg | ORAL_TABLET | Freq: Two times a day (BID) | ORAL | 6 refills | Status: DC

## 2019-01-09 MED ORDER — AMIODARONE HCL 200 MG PO TABS: 200.0000 mg | ORAL_TABLET | Freq: Every day | ORAL | 1 refills | Status: DC

## 2019-02-22 ENCOUNTER — Ambulatory Visit (HOSPITAL_COMMUNITY): Payer: Medicare HMO | Admitting: Nurse Practitioner

## 2019-02-27 ENCOUNTER — Other Ambulatory Visit: Payer: Self-pay

## 2019-02-27 ENCOUNTER — Encounter (HOSPITAL_COMMUNITY): Payer: Self-pay | Admitting: Nurse Practitioner

## 2019-02-27 ENCOUNTER — Ambulatory Visit (HOSPITAL_COMMUNITY)
Admission: RE | Admit: 2019-02-27 | Discharge: 2019-02-27 | Disposition: A | Discharge status: Home / Self Care | Payer: Medicare HMO | Source: Ambulatory Visit | Attending: Nurse Practitioner | Admitting: Nurse Practitioner

## 2019-02-27 VITALS — BP 138/70 | HR 95 | Ht 62.0 in | Wt 233.0 lb

## 2019-02-27 DIAGNOSIS — I484 Atypical atrial flutter: Secondary | ICD-10-CM

## 2019-02-27 DIAGNOSIS — Z833 Family history of diabetes mellitus: Secondary | ICD-10-CM | POA: Diagnosis not present

## 2019-02-27 DIAGNOSIS — I428 Other cardiomyopathies: Secondary | ICD-10-CM | POA: Insufficient documentation

## 2019-02-27 DIAGNOSIS — Z9981 Dependence on supplemental oxygen: Secondary | ICD-10-CM | POA: Diagnosis not present

## 2019-02-27 DIAGNOSIS — I252 Old myocardial infarction: Secondary | ICD-10-CM | POA: Diagnosis not present

## 2019-02-27 DIAGNOSIS — Z794 Long term (current) use of insulin: Secondary | ICD-10-CM | POA: Insufficient documentation

## 2019-02-27 DIAGNOSIS — E1122 Type 2 diabetes mellitus with diabetic chronic kidney disease: Secondary | ICD-10-CM | POA: Insufficient documentation

## 2019-02-27 DIAGNOSIS — D6869 Other thrombophilia: Secondary | ICD-10-CM | POA: Diagnosis not present

## 2019-02-27 DIAGNOSIS — Z880 Allergy status to penicillin: Secondary | ICD-10-CM | POA: Insufficient documentation

## 2019-02-27 DIAGNOSIS — I480 Paroxysmal atrial fibrillation: Secondary | ICD-10-CM

## 2019-02-27 DIAGNOSIS — I132 Hypertensive heart and chronic kidney disease with heart failure and with stage 5 chronic kidney disease, or end stage renal disease: Secondary | ICD-10-CM | POA: Diagnosis not present

## 2019-02-27 DIAGNOSIS — E039 Hypothyroidism, unspecified: Secondary | ICD-10-CM | POA: Diagnosis not present

## 2019-02-27 DIAGNOSIS — I2510 Atherosclerotic heart disease of native coronary artery without angina pectoris: Secondary | ICD-10-CM | POA: Insufficient documentation

## 2019-02-27 DIAGNOSIS — Z888 Allergy status to other drugs, medicaments and biological substances status: Secondary | ICD-10-CM | POA: Insufficient documentation

## 2019-02-27 DIAGNOSIS — Z8553 Personal history of malignant neoplasm of renal pelvis: Secondary | ICD-10-CM | POA: Insufficient documentation

## 2019-02-27 DIAGNOSIS — Z79899 Other long term (current) drug therapy: Secondary | ICD-10-CM | POA: Insufficient documentation

## 2019-02-27 DIAGNOSIS — Z992 Dependence on renal dialysis: Secondary | ICD-10-CM | POA: Diagnosis not present

## 2019-02-27 DIAGNOSIS — N186 End stage renal disease: Secondary | ICD-10-CM | POA: Insufficient documentation

## 2019-02-27 DIAGNOSIS — Z8249 Family history of ischemic heart disease and other diseases of the circulatory system: Secondary | ICD-10-CM | POA: Insufficient documentation

## 2019-02-27 DIAGNOSIS — I5032 Chronic diastolic (congestive) heart failure: Secondary | ICD-10-CM | POA: Diagnosis not present

## 2019-02-27 DIAGNOSIS — Z7901 Long term (current) use of anticoagulants: Secondary | ICD-10-CM | POA: Diagnosis not present

## 2019-02-27 DIAGNOSIS — E785 Hyperlipidemia, unspecified: Secondary | ICD-10-CM | POA: Diagnosis not present

## 2019-02-27 DIAGNOSIS — Z79890 Hormone replacement therapy (postmenopausal): Secondary | ICD-10-CM | POA: Diagnosis not present

## 2019-02-27 DIAGNOSIS — Z885 Allergy status to narcotic agent status: Secondary | ICD-10-CM | POA: Insufficient documentation
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
## 2019-02-28 ENCOUNTER — Encounter (HOSPITAL_COMMUNITY): Payer: Self-pay | Admitting: Nurse Practitioner

## 2019-02-28 NOTE — Addendum Note (Signed)
Encounter addended by: Sherran Needs, NP on: 02/28/2019 12:15 PM  Actions taken: Clinical Note Signed, Medication List reviewed, Problem List reviewed, Allergies reviewed, Results reviewed in IB, Visit diagnoses modified

## 2019-04-06 DIAGNOSIS — M109 Gout, unspecified: Secondary | ICD-10-CM | POA: Insufficient documentation

## 2019-04-06 HISTORY — DX: Gout, unspecified: M10.9

## 2019-04-07 ENCOUNTER — Telehealth: Payer: Self-pay

## 2019-04-07 DIAGNOSIS — Z889 Allergy status to unspecified drugs, medicaments and biological substances status: Secondary | ICD-10-CM

## 2019-04-07 HISTORY — DX: Allergy status to unspecified drugs, medicaments and biological substances: Z88.9

## 2019-04-07 NOTE — Telephone Encounter (Signed)
Pt's daughter, Stann Mainland left message to call her back regarding pt AVG site. No answer-left a VM to call us back if they still need assistance.

## 2019-04-10 ENCOUNTER — Other Ambulatory Visit: Payer: Self-pay

## 2019-04-10 ENCOUNTER — Ambulatory Visit (INDEPENDENT_AMBULATORY_CARE_PROVIDER_SITE_OTHER): Payer: Medicare HMO | Admitting: Physician Assistant

## 2019-04-10 VITALS — BP 126/76 | HR 83 | Temp 97.3°F | Resp 16 | Ht 62.0 in | Wt 225.0 lb

## 2019-04-10 DIAGNOSIS — Z992 Dependence on renal dialysis: Secondary | ICD-10-CM | POA: Insufficient documentation

## 2019-04-10 DIAGNOSIS — N186 End stage renal disease: Secondary | ICD-10-CM

## 2019-04-10 DIAGNOSIS — L03114 Cellulitis of left upper limb: Secondary | ICD-10-CM | POA: Diagnosis not present

## 2019-04-10 HISTORY — DX: End stage renal disease: N18.6

## 2019-04-10 MED ORDER — DOXYCYCLINE HYCLATE 100 MG PO CAPS: 100.0000 mg | ORAL_CAPSULE | Freq: Two times a day (BID) | ORAL | 0 refills | Status: AC
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
## 2019-04-10 NOTE — Addendum Note (Signed)
Addended byDagoberto Ligas on: 04/10/2019 04:32 PM   Modules accepted: Orders

## 2019-04-21 ENCOUNTER — Other Ambulatory Visit (HOSPITAL_COMMUNITY): Payer: Self-pay | Admitting: Nurse Practitioner

## 2019-04-28 ENCOUNTER — Telehealth: Payer: Self-pay | Admitting: Vascular Surgery

## 2019-04-28 ENCOUNTER — Other Ambulatory Visit: Payer: Self-pay | Admitting: Vascular Surgery

## 2019-04-28 MED ORDER — ONDANSETRON HCL 4 MG PO TABS: 4.0000 mg | ORAL_TABLET | Freq: Three times a day (TID) | ORAL | 1 refills | Status: DC | PRN

## 2019-04-28 NOTE — Telephone Encounter (Signed)
I received a call from the patient's daughter [name].  She reports that [name] was having bad nausea and vomiting with the doxycycline.  I reviewed her recent office visit from 315.  She does have cellulitis around a prosthetic graft.  She has very few options due to multiple antibiotic allergies.  Was called in a prescription for Zofran for nausea and will continue with her doxycycline

## 2019-05-08 ENCOUNTER — Encounter: Payer: Self-pay | Admitting: Physician Assistant

## 2019-05-08 ENCOUNTER — Telehealth (INDEPENDENT_AMBULATORY_CARE_PROVIDER_SITE_OTHER): Payer: Medicare HMO | Admitting: Physician Assistant

## 2019-05-08 ENCOUNTER — Telehealth: Payer: Self-pay

## 2019-05-08 VITALS — BP 136/85 | HR 81 | Ht 62.0 in | Wt 231.0 lb

## 2019-05-08 DIAGNOSIS — E119 Type 2 diabetes mellitus without complications: Secondary | ICD-10-CM | POA: Diagnosis not present

## 2019-05-08 DIAGNOSIS — I2510 Atherosclerotic heart disease of native coronary artery without angina pectoris: Secondary | ICD-10-CM | POA: Diagnosis not present

## 2019-05-08 DIAGNOSIS — I10 Essential (primary) hypertension: Secondary | ICD-10-CM | POA: Diagnosis not present

## 2019-05-08 DIAGNOSIS — N186 End stage renal disease: Secondary | ICD-10-CM

## 2019-05-08 DIAGNOSIS — Z992 Dependence on renal dialysis: Secondary | ICD-10-CM

## 2019-05-08 DIAGNOSIS — I4821 Permanent atrial fibrillation: Secondary | ICD-10-CM | POA: Diagnosis not present
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
## 2019-05-08 NOTE — Patient Instructions (Signed)
Medication Instructions:  Your physician recommends that you continue on your current medications as directed. Please refer to the Current Medication list given to you today.  *If you need a refill on your cardiac medications before your next appointment, please call your pharmacy*  Lab Work: NONE ordered at this time of appointment   If you have labs (blood work) drawn today and your tests are completely normal, you will receive your results only by: . MyChart Message (if you have MyChart) OR . A paper copy in the mail If you have any lab test that is abnormal or we need to change your treatment, we will call you to review the results.  Testing/Procedures: NONE ordered at this time of appointment   Follow-Up: At [facility], you and your health needs are our priority.  As part of our continuing mission to provide you with exceptional heart care, we have created designated Provider Care Teams.  These Care Teams include your primary Cardiologist (physician) and Advanced Practice Providers (APPs -  Physician Assistants and Nurse Practitioners) who all work together to provide you with the care you need, when you need it.  Your next appointment:   6 month(s)  The format for your next appointment:   In Person  Provider:   Peter Jordan, MD  Other Instructions   

## 2019-05-08 NOTE — Telephone Encounter (Signed)
  Patient Consent for Virtual Visit         [name] has provided verbal consent on 05/08/2019 for a virtual visit (video or telephone).   CONSENT FOR VIRTUAL VISIT FOR:  [name]  By participating in this virtual visit I agree to the following:  I hereby voluntarily request, consent and authorize [facility] and its employed or contracted physicians, physician assistants, nurse practitioners or other licensed health care professionals (the Practitioner), to provide me with telemedicine health care services (the "Services") as deemed necessary by the treating Practitioner. I acknowledge and consent to receive the Services by the Practitioner via telemedicine. I understand that the telemedicine visit will involve communicating with the Practitioner through live audiovisual communication technology and the disclosure of certain medical information by electronic transmission. I acknowledge that I have been given the opportunity to request an in-person assessment or other available alternative prior to the telemedicine visit and am voluntarily participating in the telemedicine visit.  I understand that I have the right to withhold or withdraw my consent to the use of telemedicine in the course of my care at any time, without affecting my right to future care or treatment, and that the Practitioner or I may terminate the telemedicine visit at any time. I understand that I have the right to inspect all information obtained and/or recorded in the course of the telemedicine visit and may receive copies of available information for a reasonable fee.  I understand that some of the potential risks of receiving the Services via telemedicine include:  Marland Kitchen Delay or interruption in medical evaluation due to technological equipment failure or disruption; . Information transmitted may not be sufficient (e.g. poor resolution of images) to allow for appropriate medical decision making by the  Practitioner; and/or  . In rare instances, security protocols could fail, causing a breach of personal health information.  Furthermore, I acknowledge that it is my responsibility to provide information about my medical history, conditions and care that is complete and accurate to the best of my ability. I acknowledge that Practitioner's advice, recommendations, and/or decision may be based on factors not within their control, such as incomplete or inaccurate data provided by me or distortions of diagnostic images or specimens that may result from electronic transmissions. I understand that the practice of medicine is not an exact science and that Practitioner makes no warranties or guarantees regarding treatment outcomes. I acknowledge that a copy of this consent can be made available to me via my patient portal ([place]), or I can request a printed copy by calling the office of [facility].    I understand that my insurance will be billed for this visit.   I have read or had this consent read to me. . I understand the contents of this consent, which adequately explains the benefits and risks of the Services being provided via telemedicine.  . I have been provided ample opportunity to ask questions regarding this consent and the Services and have had my questions answered to my satisfaction. . I give my informed consent for the services to be provided through the use of telemedicine in my medical care

## 2019-05-15 ENCOUNTER — Ambulatory Visit (HOSPITAL_COMMUNITY)
Admission: RE | Admit: 2019-05-15 | Discharge: 2019-05-15 | Disposition: A | Discharge status: Home / Self Care | Payer: Medicare HMO | Source: Ambulatory Visit | Attending: Surgery | Admitting: Surgery

## 2019-05-15 ENCOUNTER — Other Ambulatory Visit (HOSPITAL_COMMUNITY): Payer: Self-pay | Admitting: Physician Assistant

## 2019-05-15 ENCOUNTER — Ambulatory Visit (INDEPENDENT_AMBULATORY_CARE_PROVIDER_SITE_OTHER): Payer: Medicare HMO | Admitting: Physician Assistant

## 2019-05-15 ENCOUNTER — Other Ambulatory Visit: Payer: Self-pay

## 2019-05-15 VITALS — BP 136/77 | HR 82 | Temp 97.6°F | Resp 18 | Ht 62.0 in | Wt 232.3 lb

## 2019-05-15 DIAGNOSIS — R200 Anesthesia of skin: Secondary | ICD-10-CM | POA: Diagnosis not present

## 2019-05-15 DIAGNOSIS — T82898A Other specified complication of vascular prosthetic devices, implants and grafts, initial encounter: Secondary | ICD-10-CM

## 2019-05-15 DIAGNOSIS — R202 Paresthesia of skin: Secondary | ICD-10-CM

## 2019-05-15 DIAGNOSIS — Z992 Dependence on renal dialysis: Secondary | ICD-10-CM

## 2019-05-15 DIAGNOSIS — N186 End stage renal disease: Secondary | ICD-10-CM | POA: Diagnosis not present
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
## 2019-05-16 ENCOUNTER — Other Ambulatory Visit (HOSPITAL_COMMUNITY)
Admission: RE | Admit: 2019-05-16 | Discharge: 2019-05-16 | Disposition: A | Discharge status: Home / Self Care | Payer: Medicare HMO | Source: Ambulatory Visit | Attending: Surgery | Admitting: Surgery

## 2019-05-16 DIAGNOSIS — Z01812 Encounter for preprocedural laboratory examination: Secondary | ICD-10-CM | POA: Diagnosis present

## 2019-05-16 DIAGNOSIS — Z20822 Contact with and (suspected) exposure to covid-19: Secondary | ICD-10-CM | POA: Diagnosis not present

## 2019-05-16 LAB — SARS CORONAVIRUS 2 (TAT 6-24 HRS): SARS Coronavirus 2: NEGATIVE

## 2019-05-17 ENCOUNTER — Encounter (HOSPITAL_COMMUNITY): Payer: Self-pay | Admitting: Surgery

## 2019-05-17 ENCOUNTER — Other Ambulatory Visit: Payer: Self-pay

## 2019-05-17 NOTE — Progress Notes (Signed)
PA, Anesthesiology, asked to review pt history. 

## 2019-05-17 NOTE — Progress Notes (Signed)
Pt denies any acute pulmonary issues. Pt stated that she is on supplemental oxygen. Pt denies chest pain. Pt stated that she is under the care of Roderic Palau, NP, Cardiology, Peter [name], Cardiology and Dr. Lorel Monaco, PCP. Pt stated that she has a non-functioning Loop Recorder. Pt stated that she had a nuclear stress test 15 years ago. Pt stated that her last dose of Eliquis was " Monday night" as instructed. Pt made aware to stop taking vitamins, fish oil and herbal medications. Do not take any NSAIDs ie: Ibuprofen, Advil, Naproxen (Aleve), Motrin, BC and Goody Powder. Pt made aware to hold Glipizide the morning of surgery. Pt made aware to take 50% of her Lantus insulin the night before surgery. Pt made aware to check CBG every 2 hours prior to arrival to hospital on DOS. Pt made aware to treat a CBG < 70 with 4 ounces of cranberry juice, wait 15 minutes after intervention to recheck CBG, if CBG remains < 70, call Short Stay unit to speak with a nurse. Pt reminded to quarantine. Pt verbalized understanding of all pre-op instructions.

## 2019-05-18 NOTE — Progress Notes (Signed)
Anesthesia Chart Review: Same day workup  Follows with cardiology for hx of  HTN, DM II, CAD, Mixed CHF and permanent atrial fibrillation/a flutters/p failed ablation. Cath 2014 showed 50 to 60% lesion in left circumflex with normal FFR. Echo 2018 showed EF 45-50%, no signfiicant valvular abnormalities. She also has a loop recorder placed previously, after battery ran out, she elected not to have it explanted. She has a history of QT prolongation on Tikosyn and developed Torsades with azithromycin. She was taken of Tikosyn ant QT prolonging drugs avoided. Due to drop of blood pressure and heart rate during dialysis, she was taken off of daily rate control therapy as well and is only taking beta-blocker on as-needed basis.  She was most recently seen by A. fib clinic in February 2021 at which time she was in atypical atrial flutter.  Her amiodarone therapy is mainly used for rate control at this point. Last seen by general cardiology 05/08/19, no changes to management.   LD Eliquis 05/15/19.  ESRD on HD TTS  IDDMII, last A1c in Epic 6.9 on 08/25/19.  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   Will need DOS labs and eval.  EKG 02/27/19: Atypical flutter. Rate 95.Low voltage QRS. Poor anterior R wave progression  Event monitor 11/15/18: Persistent atrial fibrillation (burden 100%) Average heart rate 96 bpm (range 72-136 bpm)  TTE 11/22/16: - Left ventricle: The cavity size was normal. Systolic function was  mildly reduced. The estimated ejection fraction was in the range  of 45% to 50%. Wall motion was normal; there were no regional  wall motion abnormalities.  - Mitral valve: Mildly calcified annulus.    Wynonia Musty [facility] Short Stay Center/Anesthesiology Phone [phone] 05/18/2019 1:39 PM

## 2019-05-18 NOTE — Anesthesia Preprocedure Evaluation (Addendum)
Anesthesia Evaluation  Patient identified by MRN, date of birth, ID band Patient awake    Reviewed: Allergy & Precautions, H&P , NPO status , Patient's Chart, lab work & pertinent test results  History of Anesthesia Complications (+) PONV and Family history of anesthesia reaction  Airway Mallampati: III  TM Distance: >3 FB Neck ROM: Full    Dental no notable dental hx. (+) Partial Lower, Partial Upper, Dental Advisory Given   Pulmonary asthma , sleep apnea and Oxygen sleep apnea ,    Pulmonary exam normal breath sounds clear to auscultation       Cardiovascular hypertension, + CAD, + Past MI, +CHF and + DOE  + dysrhythmias Atrial Fibrillation  Rhythm:Regular Rate:Normal     Neuro/Psych  Headaches, Anxiety Depression Dementia    GI/Hepatic Neg liver ROS, hiatal hernia, GERD  ,  Endo/Other  diabetes, Insulin Dependent, Oral Hypoglycemic AgentsHypothyroidism Morbid obesity  Renal/GU ESRF and DialysisRenal disease  negative genitourinary   Musculoskeletal  (+) Arthritis , Osteoarthritis,    Abdominal   Peds  Hematology  (+) Blood dyscrasia, anemia ,   Anesthesia Other Findings   Reproductive/Obstetrics negative OB ROS                           Anesthesia Physical Anesthesia Plan  ASA: IV  Anesthesia Plan: MAC   Post-op Pain Management:    Induction: Intravenous  PONV Risk Score and Plan: 4 or greater and Propofol infusion, Ondansetron and Treatment may vary due to age or medical condition  Airway Management Planned: Simple Face Mask  Additional Equipment:   Intra-op Plan:   Post-operative Plan:   Informed Consent: I have reviewed the patients History and Physical, chart, labs and discussed the procedure including the risks, benefits and alternatives for the proposed anesthesia with the patient or authorized representative who has indicated his/her understanding and acceptance.      Dental advisory given  Plan Discussed with: CRNA  Anesthesia Plan Comments: (Follows with cardiology for hx of  HTN, DM II, CAD, Mixed CHF and permanent atrial fibrillation/a flutters/p failed ablation. Cath 2014 showed 50 to 60% lesion in left circumflex with normal FFR. Echo 2018 showed EF 45-50%, no signfiicant valvular abnormalities. She also has a loop recorder placed previously, after battery ran out, she elected not to have it explanted. She has a history of QT prolongation on Tikosyn and developed Torsades with azithromycin. She was taken of Tikosyn ant QT prolonging drugs avoided. Due to drop of blood pressure and heart rate during dialysis, she was taken off of daily rate control therapy as well and is only taking beta-blocker on as-needed basis.  She was most recently seen by A. fib clinic in February 2021 at which time she was in atypical atrial flutter.  Her amiodarone therapy is mainly used for rate control at this point. Last seen by general cardiology 05/08/19, no changes to management.   LD Eliquis 05/15/19.  ESRD on HD TTS  IDDMII, last A1c in Epic 6.9 on 08/25/19.  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   Will need DOS labs and eval.  EKG 02/27/19: Atypical flutter. Rate 95.Low voltage QRS. Poor anterior R wave progression  Event monitor 11/15/18: Persistent atrial fibrillation (burden 100%) Average heart rate 96 bpm (range 72-136 bpm)  TTE 11/22/16: - Left ventricle: The cavity size was normal. Systolic function was  mildly reduced. The estimated ejection fraction was in the range  of 45% to  50%. Wall motion was normal; there were no regional  wall motion abnormalities.  - Mitral valve: Mildly calcified annulus.  )       Anesthesia Quick Evaluation

## 2019-05-19 ENCOUNTER — Ambulatory Visit (HOSPITAL_COMMUNITY): Payer: Medicare HMO | Admitting: Physician Assistant

## 2019-05-19 ENCOUNTER — Ambulatory Visit (HOSPITAL_COMMUNITY): Payer: Medicare HMO

## 2019-05-19 ENCOUNTER — Ambulatory Visit (HOSPITAL_COMMUNITY)
Admission: RE | Admit: 2019-05-19 | Discharge: 2019-05-19 | Disposition: A | Discharge status: Home / Self Care | Payer: Medicare HMO | Attending: Surgery | Admitting: Surgery

## 2019-05-19 ENCOUNTER — Encounter (HOSPITAL_COMMUNITY): Payer: Self-pay | Admitting: Surgery

## 2019-05-19 ENCOUNTER — Encounter (HOSPITAL_COMMUNITY)
Admission: RE | Disposition: A | Discharge status: Home / Self Care | Payer: Self-pay | Source: Home / Self Care | Attending: Surgery

## 2019-05-19 ENCOUNTER — Other Ambulatory Visit: Payer: Self-pay

## 2019-05-19 DIAGNOSIS — Z992 Dependence on renal dialysis: Secondary | ICD-10-CM

## 2019-05-19 DIAGNOSIS — Z79899 Other long term (current) drug therapy: Secondary | ICD-10-CM | POA: Insufficient documentation

## 2019-05-19 DIAGNOSIS — E1143 Type 2 diabetes mellitus with diabetic autonomic (poly)neuropathy: Secondary | ICD-10-CM | POA: Diagnosis not present

## 2019-05-19 DIAGNOSIS — E1122 Type 2 diabetes mellitus with diabetic chronic kidney disease: Secondary | ICD-10-CM | POA: Diagnosis not present

## 2019-05-19 DIAGNOSIS — I252 Old myocardial infarction: Secondary | ICD-10-CM | POA: Diagnosis not present

## 2019-05-19 DIAGNOSIS — I132 Hypertensive heart and chronic kidney disease with heart failure and with stage 5 chronic kidney disease, or end stage renal disease: Secondary | ICD-10-CM | POA: Diagnosis not present

## 2019-05-19 DIAGNOSIS — E039 Hypothyroidism, unspecified: Secondary | ICD-10-CM | POA: Insufficient documentation

## 2019-05-19 DIAGNOSIS — I2510 Atherosclerotic heart disease of native coronary artery without angina pectoris: Secondary | ICD-10-CM | POA: Insufficient documentation

## 2019-05-19 DIAGNOSIS — K219 Gastro-esophageal reflux disease without esophagitis: Secondary | ICD-10-CM | POA: Diagnosis not present

## 2019-05-19 DIAGNOSIS — K589 Irritable bowel syndrome without diarrhea: Secondary | ICD-10-CM | POA: Insufficient documentation

## 2019-05-19 DIAGNOSIS — F419 Anxiety disorder, unspecified: Secondary | ICD-10-CM | POA: Insufficient documentation

## 2019-05-19 DIAGNOSIS — I5032 Chronic diastolic (congestive) heart failure: Secondary | ICD-10-CM | POA: Diagnosis not present

## 2019-05-19 DIAGNOSIS — I6523 Occlusion and stenosis of bilateral carotid arteries: Secondary | ICD-10-CM | POA: Insufficient documentation

## 2019-05-19 DIAGNOSIS — K3184 Gastroparesis: Secondary | ICD-10-CM | POA: Insufficient documentation

## 2019-05-19 DIAGNOSIS — N186 End stage renal disease: Secondary | ICD-10-CM

## 2019-05-19 DIAGNOSIS — I4892 Unspecified atrial flutter: Secondary | ICD-10-CM | POA: Diagnosis not present

## 2019-05-19 DIAGNOSIS — E1140 Type 2 diabetes mellitus with diabetic neuropathy, unspecified: Secondary | ICD-10-CM | POA: Insufficient documentation

## 2019-05-19 DIAGNOSIS — Y841 Kidney dialysis as the cause of abnormal reaction of the patient, or of later complication, without mention of misadventure at the time of the procedure: Secondary | ICD-10-CM | POA: Diagnosis not present

## 2019-05-19 DIAGNOSIS — E785 Hyperlipidemia, unspecified: Secondary | ICD-10-CM | POA: Diagnosis not present

## 2019-05-19 DIAGNOSIS — J45909 Unspecified asthma, uncomplicated: Secondary | ICD-10-CM | POA: Diagnosis not present

## 2019-05-19 DIAGNOSIS — I428 Other cardiomyopathies: Secondary | ICD-10-CM | POA: Diagnosis not present

## 2019-05-19 DIAGNOSIS — T82898A Other specified complication of vascular prosthetic devices, implants and grafts, initial encounter: Secondary | ICD-10-CM | POA: Insufficient documentation

## 2019-05-19 DIAGNOSIS — Z9981 Dependence on supplemental oxygen: Secondary | ICD-10-CM | POA: Insufficient documentation

## 2019-05-19 DIAGNOSIS — E559 Vitamin D deficiency, unspecified: Secondary | ICD-10-CM | POA: Insufficient documentation

## 2019-05-19 DIAGNOSIS — M109 Gout, unspecified: Secondary | ICD-10-CM | POA: Insufficient documentation

## 2019-05-19 DIAGNOSIS — E669 Obesity, unspecified: Secondary | ICD-10-CM | POA: Diagnosis not present

## 2019-05-19 DIAGNOSIS — I480 Paroxysmal atrial fibrillation: Secondary | ICD-10-CM | POA: Insufficient documentation

## 2019-05-19 DIAGNOSIS — F329 Major depressive disorder, single episode, unspecified: Secondary | ICD-10-CM | POA: Diagnosis not present

## 2019-05-19 DIAGNOSIS — F0390 Unspecified dementia without behavioral disturbance: Secondary | ICD-10-CM | POA: Diagnosis not present

## 2019-05-19 DIAGNOSIS — Z794 Long term (current) use of insulin: Secondary | ICD-10-CM | POA: Insufficient documentation

## 2019-05-19 DIAGNOSIS — Z6841 Body Mass Index (BMI) 40.0 and over, adult: Secondary | ICD-10-CM | POA: Insufficient documentation

## 2019-05-19 DIAGNOSIS — G4730 Sleep apnea, unspecified: Secondary | ICD-10-CM | POA: Insufficient documentation

## 2019-05-19 DIAGNOSIS — Z95828 Presence of other vascular implants and grafts: Secondary | ICD-10-CM

## 2019-05-19 DIAGNOSIS — Z79890 Hormone replacement therapy (postmenopausal): Secondary | ICD-10-CM | POA: Insufficient documentation

## 2019-05-19 HISTORY — DX: Anemia, unspecified: D64.9

## 2019-05-19 HISTORY — DX: Headache, unspecified: R51.9

## 2019-05-19 HISTORY — PX: LIGATION OF ARTERIOVENOUS  FISTULA: SHX5948

## 2019-05-19 HISTORY — DX: Presence of spectacles and contact lenses: Z97.3

## 2019-05-19 HISTORY — PX: INSERTION OF DIALYSIS CATHETER: SHX1324

## 2019-05-19 HISTORY — DX: Unspecified hearing loss, unspecified ear: H91.90

## 2019-05-19 LAB — PROTIME-INR
INR: 1.1 (ref 0.8–1.2)
Prothrombin Time: 14 seconds (ref 11.4–15.2)

## 2019-05-19 LAB — GLUCOSE, CAPILLARY: Glucose-Capillary: 93 mg/dL (ref 70–99)

## 2019-05-19 LAB — POCT I-STAT, CHEM 8
BUN: 17 mg/dL (ref 8–23)
Calcium, Ion: 1.08 mmol/L — ABNORMAL LOW (ref 1.15–1.40)
Chloride: 95 mmol/L — ABNORMAL LOW (ref 98–111)
Creatinine, Ser: 3.9 mg/dL — ABNORMAL HIGH (ref 0.44–1.00)
Glucose, Bld: 128 mg/dL — ABNORMAL HIGH (ref 70–99)
HCT: 40 % (ref 36.0–46.0)
Hemoglobin: 13.6 g/dL (ref 12.0–15.0)
Potassium: 3.7 mmol/L (ref 3.5–5.1)
Sodium: 137 mmol/L (ref 135–145)
TCO2: 33 mmol/L — ABNORMAL HIGH (ref 22–32)

## 2019-05-19 SURGERY — LIGATION OF ARTERIOVENOUS  FISTULA
Anesthesia: General

## 2019-05-19 MED ORDER — SODIUM CHLORIDE 0.9 % IV SOLN
INTRAVENOUS | Status: AC
  Filled 2019-05-19: qty 1.2

## 2019-05-19 MED ORDER — SUGAMMADEX SODIUM 200 MG/2ML IV SOLN
INTRAVENOUS | Status: DC | PRN
  Administered 2019-05-19 (×3): 100 mg via INTRAVENOUS

## 2019-05-19 MED ORDER — PROPOFOL 500 MG/50ML IV EMUL
INTRAVENOUS | Status: DC | PRN
  Administered 2019-05-19: 75 ug/kg/min via INTRAVENOUS

## 2019-05-19 MED ORDER — PHENYLEPHRINE HCL-NACL 10-0.9 MG/250ML-% IV SOLN
INTRAVENOUS | Status: DC | PRN
  Administered 2019-05-19: 50 ug/min via INTRAVENOUS

## 2019-05-19 MED ORDER — HEPARIN SODIUM (PORCINE) 1000 UNIT/ML IJ SOLN
INTRAMUSCULAR | Status: DC | PRN
  Administered 2019-05-19: 3400 [IU]

## 2019-05-19 MED ORDER — 0.9 % SODIUM CHLORIDE (POUR BTL) OPTIME
TOPICAL | Status: DC | PRN
  Administered 2019-05-19: 13:00:00 1000 mL

## 2019-05-19 MED ORDER — FENTANYL CITRATE (PF) 250 MCG/5ML IJ SOLN
INTRAMUSCULAR | Status: AC
  Filled 2019-05-19: qty 5

## 2019-05-19 MED ORDER — SODIUM CHLORIDE 0.9 % IV SOLN
INTRAVENOUS | Status: DC | PRN
  Administered 2019-05-19: 500 mL

## 2019-05-19 MED ORDER — PHENYLEPHRINE 40 MCG/ML (10ML) SYRINGE FOR IV PUSH (FOR BLOOD PRESSURE SUPPORT)
PREFILLED_SYRINGE | INTRAVENOUS | Status: DC | PRN
  Administered 2019-05-19 (×2): 120 ug via INTRAVENOUS

## 2019-05-19 MED ORDER — FENTANYL CITRATE (PF) 100 MCG/2ML IJ SOLN
INTRAMUSCULAR | Status: DC | PRN
  Administered 2019-05-19: 25 ug via INTRAVENOUS

## 2019-05-19 MED ORDER — FENTANYL CITRATE (PF) 100 MCG/2ML IJ SOLN: 25.0000 ug | INTRAMUSCULAR | Status: DC | PRN

## 2019-05-19 MED ORDER — CHLORHEXIDINE GLUCONATE 4 % EX LIQD: 60.0000 mL | Freq: Once | CUTANEOUS | Status: DC

## 2019-05-19 MED ORDER — HEPARIN SODIUM (PORCINE) 1000 UNIT/ML IJ SOLN
INTRAMUSCULAR | Status: AC
  Filled 2019-05-19: qty 1

## 2019-05-19 MED ORDER — CLINDAMYCIN PHOSPHATE 900 MG/50ML IV SOLN
INTRAVENOUS | Status: AC
  Filled 2019-05-19: qty 50

## 2019-05-19 MED ORDER — PROPOFOL 10 MG/ML IV BOLUS
INTRAVENOUS | Status: AC
  Filled 2019-05-19: qty 20

## 2019-05-19 MED ORDER — LIDOCAINE 2% (20 MG/ML) 5 ML SYRINGE
INTRAMUSCULAR | Status: DC | PRN
  Administered 2019-05-19: 60 mg via INTRAVENOUS

## 2019-05-19 MED ORDER — PROPOFOL 10 MG/ML IV BOLUS
INTRAVENOUS | Status: DC | PRN
  Administered 2019-05-19: 20 mg via INTRAVENOUS

## 2019-05-19 MED ORDER — SODIUM CHLORIDE 0.9 % IV SOLN: INTRAVENOUS | Status: DC

## 2019-05-19 MED ORDER — CLINDAMYCIN PHOSPHATE 900 MG/50ML IV SOLN
900.0000 mg | INTRAVENOUS | Status: AC
  Administered 2019-05-19: 900 mg via INTRAVENOUS
  Filled 2019-05-19: qty 50

## 2019-05-19 MED ORDER — ACETAMINOPHEN 500 MG PO TABS
1000.0000 mg | ORAL_TABLET | Freq: Once | ORAL | Status: AC
  Administered 2019-05-19: 09:00:00 1000 mg via ORAL
  Filled 2019-05-19: qty 2

## 2019-05-19 MED ORDER — ONDANSETRON HCL 4 MG/2ML IJ SOLN
INTRAMUSCULAR | Status: DC | PRN
  Administered 2019-05-19: 4 mg via INTRAVENOUS

## 2019-05-19 MED ORDER — ROCURONIUM BROMIDE 100 MG/10ML IV SOLN
INTRAVENOUS | Status: DC | PRN
Start: 2019-05-19 — End: 2019-05-19
  Administered 2019-05-19: 40 mg via INTRAVENOUS

## 2019-05-19 MED ORDER — LIDOCAINE-EPINEPHRINE 1 %-1:100000 IJ SOLN
INTRAMUSCULAR | Status: AC
  Filled 2019-05-19: qty 1

## 2019-05-19 MED ORDER — TRAMADOL HCL 50 MG PO TABS: 50.0000 mg | ORAL_TABLET | Freq: Three times a day (TID) | ORAL | 0 refills | Status: DC | PRN

## 2019-05-19 MED ORDER — ALBUMIN HUMAN 5 % IV SOLN: INTRAVENOUS | Status: DC | PRN

## 2019-05-19 SURGICAL SUPPLY — 56 items
ADH SKN CLS APL DERMABOND .7 (GAUZE/BANDAGES/DRESSINGS) ×4
BAG DECANTER FOR FLEXI CONT (MISCELLANEOUS) ×4 IMPLANT
BIOPATCH RED 1 DISK 7.0 (GAUZE/BANDAGES/DRESSINGS) ×3 IMPLANT
BIOPATCH RED 1IN DISK 7.0MM (GAUZE/BANDAGES/DRESSINGS) ×1
CANISTER SUCT 3000ML PPV (MISCELLANEOUS) ×4 IMPLANT
CATH PALINDROME RT-P 15FX19CM (CATHETERS) IMPLANT
CATH PALINDROME RT-P 15FX23CM (CATHETERS) ×2 IMPLANT
CATH PALINDROME RT-P 15FX28CM (CATHETERS) IMPLANT
CATH PALINDROME RT-P 15FX55CM (CATHETERS) IMPLANT
CLIP VESOCCLUDE MED 6/CT (CLIP) ×4 IMPLANT
CLIP VESOCCLUDE SM WIDE 6/CT (CLIP) ×4 IMPLANT
COVER PROBE W GEL 5X96 (DRAPES) ×4 IMPLANT
COVER SURGICAL LIGHT HANDLE (MISCELLANEOUS) ×4 IMPLANT
COVER WAND RF STERILE (DRAPES) ×2 IMPLANT
DERMABOND ADVANCED (GAUZE/BANDAGES/DRESSINGS) ×4
DERMABOND ADVANCED .7 DNX12 (GAUZE/BANDAGES/DRESSINGS) ×2 IMPLANT
DRAPE C-ARM 42X72 X-RAY (DRAPES) ×4 IMPLANT
DRAPE CHEST BREAST 15X10 FENES (DRAPES) ×4 IMPLANT
ELECT REM PT RETURN 9FT ADLT (ELECTROSURGICAL) ×4
ELECTRODE REM PT RTRN 9FT ADLT (ELECTROSURGICAL) ×2 IMPLANT
GAUZE 4X4 16PLY RFD (DISPOSABLE) ×4 IMPLANT
GLOVE BIOGEL PI IND STRL 7.5 (GLOVE) ×2 IMPLANT
GLOVE BIOGEL PI INDICATOR 7.5 (GLOVE) ×2
GLOVE SURG SS PI 7.5 STRL IVOR (GLOVE) ×4 IMPLANT
GOWN STRL REUS W/ TWL LRG LVL3 (GOWN DISPOSABLE) ×4 IMPLANT
GOWN STRL REUS W/ TWL XL LVL3 (GOWN DISPOSABLE) ×2 IMPLANT
GOWN STRL REUS W/TWL LRG LVL3 (GOWN DISPOSABLE) ×8
GOWN STRL REUS W/TWL XL LVL3 (GOWN DISPOSABLE) ×4
HEMOSTAT SNOW SURGICEL 2X4 (HEMOSTASIS) IMPLANT
KIT BASIN OR (CUSTOM PROCEDURE TRAY) ×4 IMPLANT
KIT TURNOVER KIT B (KITS) ×4 IMPLANT
NDL 18GX1X1/2 (RX/OR ONLY) (NEEDLE) ×2 IMPLANT
NDL HYPO 25GX1X1/2 BEV (NEEDLE) ×2 IMPLANT
NEEDLE 18GX1X1/2 (RX/OR ONLY) (NEEDLE) ×4 IMPLANT
NEEDLE HYPO 25GX1X1/2 BEV (NEEDLE) ×4 IMPLANT
NS IRRIG 1000ML POUR BTL (IV SOLUTION) ×4 IMPLANT
PACK CV ACCESS (CUSTOM PROCEDURE TRAY) ×4 IMPLANT
PACK SURGICAL SETUP 50X90 (CUSTOM PROCEDURE TRAY) ×4 IMPLANT
PAD ARMBOARD 7.5X6 YLW CONV (MISCELLANEOUS) ×8 IMPLANT
SET MICROPUNCTURE 5F STIFF (MISCELLANEOUS) ×2 IMPLANT
SOAP 2 % CHG 4 OZ (WOUND CARE) ×2 IMPLANT
SUT ETHILON 3 0 PS 1 (SUTURE) ×4 IMPLANT
SUT PROLENE 5 0 C 1 24 (SUTURE) ×2 IMPLANT
SUT PROLENE 6 0 BV (SUTURE) IMPLANT
SUT SILK 0 TIES 10X30 (SUTURE) ×2 IMPLANT
SUT VIC AB 3-0 SH 27 (SUTURE) ×4
SUT VIC AB 3-0 SH 27X BRD (SUTURE) ×2 IMPLANT
SUT VICRYL 4-0 PS2 18IN ABS (SUTURE) ×6 IMPLANT
SYR 10ML LL (SYRINGE) ×4 IMPLANT
SYR 20ML LL LF (SYRINGE) ×8 IMPLANT
SYR 5ML LL (SYRINGE) ×4 IMPLANT
SYR CONTROL 10ML LL (SYRINGE) ×4 IMPLANT
TOWEL GREEN STERILE (TOWEL DISPOSABLE) ×8 IMPLANT
TOWEL GREEN STERILE FF (TOWEL DISPOSABLE) ×4 IMPLANT
UNDERPAD 30X30 (UNDERPADS AND DIAPERS) ×4 IMPLANT
WATER STERILE IRR 1000ML POUR (IV SOLUTION) ×4 IMPLANT

## 2019-05-19 NOTE — Anesthesia Procedure Notes (Signed)
Procedure Name: MAC Date/Time: 05/19/2019 12:05 PM Performed by: Oletta Lamas, CRNA Pre-anesthesia Checklist: Patient identified, Emergency Drugs available, Suction available, Patient being monitored and Timeout performed Patient Re-evaluated:Patient Re-evaluated prior to induction Oxygen Delivery Method: Simple face mask Preoxygenation: Pre-oxygenation with 100% oxygen Induction Type: IV induction Placement Confirmation: positive ETCO2

## 2019-05-19 NOTE — Op Note (Signed)
    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  Catheter tip was at the cavoatrial junction  Indications: This is a 79 year old female, status post left arm graft placement.  She has developed numbness and weakness in her left hand.  Duplex of her graft revealed significant change in radial artery pressure with graft compression.  Because of her symptoms we had recommended graft ligation.  Procedure:  The patient was identified in the holding area and taken to [place] [facility]  The patient was then placed supine on the table. general anesthesia was administered.  The patient was prepped and draped in the usual sterile fashion.  A time out was called and antibiotics were administered.  Ultrasound was used to evaluate the right internal jugular vein which was widely patent and easily compressible.  A #11 blade was used to make a skin nick and the right internal jugular vein was cannulated with a micropuncture needle.  An 018 wire was advanced without resistance followed by placement of a micropuncture sheath.  Next a 035 J-wire was advanced into the inferior vena cava under fluoroscopic visualization.  The subcutaneous tract was dilated with sequential dilators and the peel-away sheath was placed.  Next a 23 cm palindrome catheter was inserted and the peel-away sheath was removed.  A skin exit site below the clavicle was selected a #11 blade was used to make a skin nick and subcutaneous tunnel was then created.  The  catheter was then brought through the previously created tunnel with the cuff situated at the skin exit site.  Fluoroscopy confirmed that the catheter tip was in good position.  It was then connected to the hub.  Both ports flushed and aspirated without difficulty.  The catheter was sutured in position with 3-0 nylon.  The neck incision was closed with 4-0 Vicryl and Dermabond.  The catheter was filled the appropriate volumes of heparin.  Next attention was turned towards the left arm.  A longitudinal incision was made over the graft.  Using cautery and sharp dissection the graft was circumferentially exposed.  It was ligated proximally and distally with a silk tie.  I oversewed the arterial end with 5-0 Prolene in 2 layers.  There was a significant improvement in her palpable left radial pulse.  The wound was irrigated and closed with 3-0 and 4-0 Vicryl followed by Dermabond.  There were no immediate complications.  The patient will be brought back to the office for further discussions regarding new access.   Disposition: To PACU stable.   Theotis Burrow, M.D., [facility] [place] Office: [phone] Pager:  [phone]

## 2019-05-19 NOTE — Interval H&P Note (Signed)
History and Physical Interval Note:  05/19/2019 11:24 AM  [name]  has presented today for surgery, with the diagnosis of END STAGE RENAL DISEASE.  The various methods of treatment have been discussed with the patient and family. After consideration of risks, benefits and other options for treatment, the patient has consented to  Procedure(s): LIGATION OF ARTERIOVENOUS  GRAFT (Left) INSERTION OF DIALYSIS CATHETER (N/A) as a surgical intervention.  The patient's history has been reviewed, patient examined, no change in status, stable for surgery.  I have reviewed the patient's chart and labs.  Questions were answered to the patient's satisfaction.     [name]

## 2019-05-19 NOTE — Progress Notes (Signed)
Waiting on results of CXR so that patient can be discharged.

## 2019-05-19 NOTE — Transfer of Care (Signed)
Immediate Anesthesia Transfer of Care Note  Patient: [name]  Procedure(s) Performed: LIGATION OF ARTERIOVENOUS  GRAFT (Left ) INSERTION OF DIALYSIS CATHETER (N/A )  Patient Location: PACU  Anesthesia Type:General  Level of Consciousness: awake, alert  and oriented  Airway & Oxygen Therapy: Patient Spontanous Breathing and Patient connected to face mask oxygen  Post-op Assessment: Report given to RN and Post -op Vital signs reviewed and stable  Post vital signs: Reviewed and stable  Last Vitals:  Vitals Value Taken Time  BP 125/84 05/19/19 1340  Temp    Pulse 77 05/19/19 1344  Resp 13 05/19/19 1344  SpO2 99 % 05/19/19 1344  Vitals shown include unvalidated device data.  Last Pain:  Vitals:   05/19/19 0803  PainSc: 8       Patients Stated Pain Goal: 2 (14/83/07 3543)  Complications: No apparent anesthesia complications

## 2019-05-19 NOTE — Progress Notes (Signed)
Spoke with Leontine Locket, PA and asked her to give the daughter a call for an update on the surgery.

## 2019-05-19 NOTE — Anesthesia Procedure Notes (Signed)
Procedure Name: Intubation Date/Time: 05/19/2019 12:34 PM Performed by: Oletta Lamas, CRNA Pre-anesthesia Checklist: Patient identified, Emergency Drugs available, Suction available, Patient being monitored and Timeout performed Patient Re-evaluated:Patient Re-evaluated prior to induction Oxygen Delivery Method: Circle system utilized Preoxygenation: Pre-oxygenation with 100% oxygen Induction Type: IV induction Ventilation: Mask ventilation without difficulty Laryngoscope Size: Mac and 3 Grade View: Grade I Tube type: Oral Tube size: 7.0 mm Number of attempts: 1 Airway Equipment and Method: Stylet Placement Confirmation: ETT inserted through vocal cords under direct vision,  positive ETCO2 and breath sounds checked- equal and bilateral Secured at: 22 cm Tube secured with: Tape Dental Injury: Teeth and Oropharynx as per pre-operative assessment

## 2019-05-19 NOTE — Discharge Instructions (Signed)
   [facility]  Discharge Instructions  AV Fistula or Graft Surgery for Dialysis Access  Please refer to the following instructions for your post-procedure care. Your surgeon or physician assistant will discuss any changes with you.  Activity  You may drive the day following your surgery, if you are comfortable and no longer taking prescription pain medication. Resume full activity as the soreness in your incision resolves.  Bathing/Showering  You may shower after you go home. Keep your incision dry for 48 hours. Do not soak in a bathtub, hot tub, or swim until the incision heals completely. You may not shower if you have a hemodialysis catheter.  Incision Care  Clean your incision with mild soap and water after 48 hours. Pat the area dry with a clean towel. You do not need a bandage unless otherwise instructed. Do not apply any ointments or creams to your incision. You may have skin glue on your incision. Do not peel it off. It will come off on its own in about one week. Your arm may swell a bit after surgery. To reduce swelling use pillows to elevate your arm so it is above your heart. Your doctor will tell you if you need to lightly wrap your arm with an ACE bandage.  Diet  Resume your normal diet. There are not special food restrictions following this procedure. In order to heal from your surgery, it is CRITICAL to get adequate nutrition. Your body requires vitamins, minerals, and protein. Vegetables are the best source of vitamins and minerals. Vegetables also provide the perfect balance of protein. Processed food has little nutritional value, so try to avoid this.  Medications  Resume taking all of your medications. If your incision is causing pain, you may take over-the counter pain relievers such as acetaminophen (Tylenol). If you were prescribed a stronger pain medication, please be aware these medications can cause nausea and constipation. Prevent  nausea by taking the medication with a snack or meal. Avoid constipation by drinking plenty of fluids and eating foods with high amount of fiber, such as fruits, vegetables, and grains.  Do not take Tylenol if you are taking prescription pain medications.  Restart Eliquis 05/20/2019.  Follow up Your surgeon may want to see you in the office following your access surgery. If so, this will be arranged at the time of your surgery.  Please call us immediately for any of the following conditions:  . Increased pain, redness, drainage (pus) from your incision site . Fever of 101 degrees or higher . Severe or worsening pain at your incision site . Hand pain or numbness. .  Reduce your risk of vascular disease:  . Stop smoking. If you would like help, call [facility] at 1-800-QUIT-NOW (339) 741-2338) or [place] at 518-121-6774  . Manage your cholesterol . Maintain a desired weight . Control your diabetes . Keep your blood pressure down  Dialysis  It will take several weeks to several months for your new dialysis access to be ready for use. Your surgeon will determine when it is okay to use it. Your nephrologist will continue to direct your dialysis. You can continue to use your Permcath until your new access is ready for use.   05/19/2019 [name] [id number] 1940-04-11  Surgeon(s): Serafina Mitchell, MD  Procedure(s): LIGATION OF ARTERIOVENOUS  GRAFT INSERTION OF DIALYSIS CATHETER   If you have any questions, please call the office at [phone].

## 2019-05-19 NOTE — Anesthesia Postprocedure Evaluation (Signed)
Anesthesia Post Note  Patient: [name]  Procedure(s) Performed: LIGATION OF ARTERIOVENOUS  GRAFT (Left ) INSERTION OF DIALYSIS CATHETER (N/A )     Patient location during evaluation: PACU Anesthesia Type: General Level of consciousness: awake and alert Pain management: pain level controlled Vital Signs Assessment: post-procedure vital signs reviewed and stable Respiratory status: spontaneous breathing, nonlabored ventilation, respiratory function stable and patient connected to nasal cannula oxygen Cardiovascular status: blood pressure returned to baseline and stable Postop Assessment: no apparent nausea or vomiting Anesthetic complications: no    Last Vitals:  Vitals:   05/19/19 1405 05/19/19 1410  BP:  115/69  Pulse: 79 80  Resp: 19 13  Temp:    SpO2: 93% 99%    Last Pain:  Vitals:   05/19/19 1355  PainSc: 8                  Lindsey Demonte,W. EDMOND

## 2019-05-23 DIAGNOSIS — D689 Coagulation defect, unspecified: Secondary | ICD-10-CM

## 2019-05-23 HISTORY — DX: Coagulation defect, unspecified: D68.9

## 2019-06-07 ENCOUNTER — Other Ambulatory Visit: Payer: Self-pay | Admitting: *Deleted

## 2019-06-07 DIAGNOSIS — N186 End stage renal disease: Secondary | ICD-10-CM

## 2019-06-07 DIAGNOSIS — I739 Peripheral vascular disease, unspecified: Secondary | ICD-10-CM

## 2019-06-12 ENCOUNTER — Ambulatory Visit (INDEPENDENT_AMBULATORY_CARE_PROVIDER_SITE_OTHER): Payer: Self-pay | Admitting: Surgery

## 2019-06-12 ENCOUNTER — Other Ambulatory Visit: Payer: Self-pay

## 2019-06-12 ENCOUNTER — Ambulatory Visit (INDEPENDENT_AMBULATORY_CARE_PROVIDER_SITE_OTHER)
Admit: 2019-06-12 | Discharge: 2019-06-12 | Disposition: A | Discharge status: Home / Self Care | Payer: Medicare HMO | Attending: Surgery | Admitting: Surgery

## 2019-06-12 ENCOUNTER — Ambulatory Visit (HOSPITAL_COMMUNITY)
Admission: RE | Admit: 2019-06-12 | Discharge: 2019-06-12 | Disposition: A | Discharge status: Home / Self Care | Payer: Medicare HMO | Source: Ambulatory Visit | Attending: Surgery | Admitting: Surgery

## 2019-06-12 ENCOUNTER — Other Ambulatory Visit (HOSPITAL_COMMUNITY): Payer: Medicare HMO

## 2019-06-12 ENCOUNTER — Encounter: Payer: Self-pay | Admitting: Surgery

## 2019-06-12 VITALS — BP 131/79 | HR 75 | Temp 97.6°F | Resp 20 | Ht 62.0 in | Wt 235.0 lb

## 2019-06-12 DIAGNOSIS — N186 End stage renal disease: Secondary | ICD-10-CM

## 2019-06-12 DIAGNOSIS — Z992 Dependence on renal dialysis: Secondary | ICD-10-CM

## 2019-06-12 DIAGNOSIS — I739 Peripheral vascular disease, unspecified: Secondary | ICD-10-CM | POA: Insufficient documentation

## 2019-06-12 NOTE — H&P (View-Only) (Signed)
Patient name: [name] MRN: [id number] DOB: 09-Oct-1940 Sex: female  REASON FOR VISIT:     post op  HISTORY OF PRESENT ILLNESS:   [name] is a 79 y.o. female who underwent a left arm second stage basilic vein transposition interposition graft on 10/05/2018 with Dr. Donzetta Matters.  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  I ligated her access on 05/19/2019 and placed a catheter.  CURRENT MEDICATIONS:    Current Outpatient Medications  Medication Sig Dispense Refill  . acetaminophen (TYLENOL) 500 MG tablet Take 0.5 tablets (250 mg total) by mouth every 6 (six) hours as needed for mild pain (pain).    Marland Kitchen amiodarone (PACERONE) 200 MG tablet TAKE ONE (1) TABLET EACH DAY (Patient taking differently: Take 200 mg by mouth daily. ) 90 tablet 1  . apixaban (ELIQUIS) 5 MG TABS tablet Take 1 tablet (5 mg total) by mouth 2 (two) times daily. 60 tablet 6  . B Complex-C-Folic Acid (RENAL VITAMIN) 0.8 MG TABS Take 0.8 mg by mouth daily.    . cetirizine (ZYRTEC) 10 MG tablet Take 5 mg by mouth daily.    . clonazePAM (KLONOPIN) 1 MG tablet Take 0.5-1 mg by mouth 3 (three) times daily as needed for anxiety.     Marland Kitchen doxycycline (VIBRAMYCIN) 100 MG capsule Take 100 mg by mouth 2 (two) times daily.    Marland Kitchen estradiol (ESTRACE) 1 MG tablet Take 1 mg by mouth daily.    . famotidine (PEPCID) 20 MG tablet Take 40 mg by mouth daily.     . ferric citrate (AURYXIA) 1 GM 210 MG(Fe) tablet Take 210 mg by mouth 3 (three) times daily with meals.     Marland Kitchen glipiZIDE (GLUCOTROL XL) 10 MG 24 hr tablet Take 10 mg by mouth every other day.     . Insulin Glargine (LANTUS) 100 UNIT/ML Solostar Pen Inject 5-35 Units into the skin 2 (two) times daily. Before Lunch per sliding scale: Under 100= 20 units, 100-180= 25 units, 180-240= 30 units, Over 240= 35 units At 10pm per sliding Scale: Under 100= 5 units, 100-200= 10 units, Over 200= 15 units    . levothyroxine (SYNTHROID) 150 MCG tablet Take  150 mcg by mouth daily before breakfast.     . ondansetron (ZOFRAN) 4 MG tablet Take 1 tablet (4 mg total) by mouth every 8 (eight) hours as needed for nausea or vomiting. 30 tablet 1  . simvastatin (ZOCOR) 10 MG tablet Take 10 mg by mouth daily at 6 PM.     . SYSTANE 0.4-0.3 % SOLN Place 1 drop into both eyes 3 (three) times daily as needed (DRY EYE RELIEF).    Marland Kitchen traMADol (ULTRAM) 50 MG tablet Take 1 tablet (50 mg total) by mouth every 8 (eight) hours as needed. 8 tablet 0  . vitamin B-12 (CYANOCOBALAMIN) 500 MCG tablet Take 500 mcg by mouth daily.     No current facility-administered medications for this visit.    REVIEW OF SYSTEMS:   [X]  denotes positive finding, [ ]  denotes negative finding Cardiac  Comments:  Chest pain or chest pressure:    Shortness of breath upon exertion:    Short of breath when lying flat:    Irregular heart rhythm:    Constitutional    Fever or chills:      PHYSICAL EXAM:   Vitals:   06/12/19 1034  BP: 131/79  Pulse: 75  Resp: 20  Temp: 97.6 F (36.4 C)  SpO2: 95%  Weight:  235 lb (106.6 kg)  Height: 5\' 2"  (1.575 m)    GENERAL: The patient is a well-nourished female, in no acute distress. The vital signs are documented above. CARDIOVASCULAR: There is a regular rate and rhythm. PULMONARY: Non-labored respirations   STUDIES:   +-----------------+-------------+----------+---------+  Right Cephalic  Diameter (cm)Depth (cm)Findings   +-----------------+-------------+----------+---------+  Mid upper arm   0.28 / NV             +-----------------+-------------+----------+---------+  Dist upper arm    0.31               +-----------------+-------------+----------+---------+  Antecubital fossa  0.30               +-----------------+-------------+----------+---------+  Prox forearm    0.28 / 0.31             +-----------------+-------------+----------+---------+  Mid  forearm     0.45               +-----------------+-------------+----------+---------+  Dist forearm     0.32        branching  +-----------------+-------------+----------+---------+   +-----------------+-------------+----------+--------------+  Right Basilic  Diameter (cm)Depth (cm)  Findings    +-----------------+-------------+----------+--------------+  Shoulder                 not visualized  +-----------------+-------------+----------+--------------+  Prox upper arm              not visualized  +-----------------+-------------+----------+--------------+  Mid upper arm    0.53                 +-----------------+-------------+----------+--------------+  Dist upper arm    0.51                 +-----------------+-------------+----------+--------------+  Antecubital fossa  0.56         branching    +-----------------+-------------+----------+--------------+  Prox forearm     0.17                 +-----------------+-------------+----------+--------------+   +-----------------+-------------+----------+--------+  Left Cephalic  Diameter (cm)Depth (cm)Findings  +-----------------+-------------+----------+--------+  Prox upper arm    0.41        tortuous  +-----------------+-------------+----------+--------+  Mid upper arm    0.37              +-----------------+-------------+----------+--------+  Dist upper arm    0.33              +-----------------+-------------+----------+--------+  Antecubital fossa  0.33              +-----------------+-------------+----------+--------+  Prox forearm     0.42              +-----------------+-------------+----------+--------+  Mid forearm    0.42 / 0.31              +-----------------+-------------+----------+--------+  Dist forearm     0.47              +-----------------+-------------+----------+--------+   +-----------------+-------------+----------+--------+  Left Basilic   Diameter (cm)Depth (cm)Findings  +-----------------+-------------+----------+--------+  Prox upper arm               graft   +-----------------+-------------+----------+--------+  Mid upper arm               graft   +-----------------+-------------+----------+--------+  Dist upper arm               graft   +-----------------+-------------+----------+--------+  Antecubital fossa             graft   +-----------------+-------------+----------+--------+  Prox forearm  0.67              +-----------------+-------------+----------+--------+   ABI   +-------+-----------+-----------+------------+------------+  Right 1.34    0.84                  +-------+-----------+-----------+------------+------------+  Left  [place]     0.88                   Right toe:  119 Left toe 125 +-------+-----------+-----------+------------+------------+    MEDICAL ISSUES:   ESRD: The patient is refusing a right upper extremity access given that that is her only functioning arm after the steal has affected her left arm.  We discussed placing a thigh graft.  She has had cardiac caths in the past on the right and so I will go into her left groin for right thigh graft.  I discussed the details of the operation.  Depending on how she does I may consider keeping her overnight.  We talked about the risk of steal in the thigh graft.  We also talked about the risk of infection and the importance of keeping the area clean and dry.  Her procedure is scheduled for Friday, May 22  Leia Alf, MD, FACS [facility]  [facility] [phone] Pager [phone]

## 2019-06-12 NOTE — Progress Notes (Signed)
Patient name: [name] MRN: [id number] DOB: 1940/11/14 Sex: female  REASON FOR VISIT:     post op  HISTORY OF PRESENT ILLNESS:   [name] is a 79 y.o. female who underwent a left arm second stage basilic vein transposition interposition graft on 10/05/2018 with Dr. Donzetta Matters.  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  I ligated her access on 05/19/2019 and placed a catheter.  CURRENT MEDICATIONS:    Current Outpatient Medications  Medication Sig Dispense Refill  . acetaminophen (TYLENOL) 500 MG tablet Take 0.5 tablets (250 mg total) by mouth every 6 (six) hours as needed for mild pain (pain).    Marland Kitchen amiodarone (PACERONE) 200 MG tablet TAKE ONE (1) TABLET EACH DAY (Patient taking differently: Take 200 mg by mouth daily. ) 90 tablet 1  . apixaban (ELIQUIS) 5 MG TABS tablet Take 1 tablet (5 mg total) by mouth 2 (two) times daily. 60 tablet 6  . B Complex-C-Folic Acid (RENAL VITAMIN) 0.8 MG TABS Take 0.8 mg by mouth daily.    . cetirizine (ZYRTEC) 10 MG tablet Take 5 mg by mouth daily.    . clonazePAM (KLONOPIN) 1 MG tablet Take 0.5-1 mg by mouth 3 (three) times daily as needed for anxiety.     Marland Kitchen doxycycline (VIBRAMYCIN) 100 MG capsule Take 100 mg by mouth 2 (two) times daily.    Marland Kitchen estradiol (ESTRACE) 1 MG tablet Take 1 mg by mouth daily.    . famotidine (PEPCID) 20 MG tablet Take 40 mg by mouth daily.     . ferric citrate (AURYXIA) 1 GM 210 MG(Fe) tablet Take 210 mg by mouth 3 (three) times daily with meals.     Marland Kitchen glipiZIDE (GLUCOTROL XL) 10 MG 24 hr tablet Take 10 mg by mouth every other day.     . Insulin Glargine (LANTUS) 100 UNIT/ML Solostar Pen Inject 5-35 Units into the skin 2 (two) times daily. Before Lunch per sliding scale: Under 100= 20 units, 100-180= 25 units, 180-240= 30 units, Over 240= 35 units At 10pm per sliding Scale: Under 100= 5 units, 100-200= 10 units, Over 200= 15 units    . levothyroxine (SYNTHROID) 150 MCG tablet Take  150 mcg by mouth daily before breakfast.     . ondansetron (ZOFRAN) 4 MG tablet Take 1 tablet (4 mg total) by mouth every 8 (eight) hours as needed for nausea or vomiting. 30 tablet 1  . simvastatin (ZOCOR) 10 MG tablet Take 10 mg by mouth daily at 6 PM.     . SYSTANE 0.4-0.3 % SOLN Place 1 drop into both eyes 3 (three) times daily as needed (DRY EYE RELIEF).    Marland Kitchen traMADol (ULTRAM) 50 MG tablet Take 1 tablet (50 mg total) by mouth every 8 (eight) hours as needed. 8 tablet 0  . vitamin B-12 (CYANOCOBALAMIN) 500 MCG tablet Take 500 mcg by mouth daily.     No current facility-administered medications for this visit.    REVIEW OF SYSTEMS:   [X]  denotes positive finding, [ ]  denotes negative finding Cardiac  Comments:  Chest pain or chest pressure:    Shortness of breath upon exertion:    Short of breath when lying flat:    Irregular heart rhythm:    Constitutional    Fever or chills:      PHYSICAL EXAM:   Vitals:   06/12/19 1034  BP: 131/79  Pulse: 75  Resp: 20  Temp: 97.6 F (36.4 C)  SpO2: 95%  Weight:  235 lb (106.6 kg)  Height: 5\' 2"  (1.575 m)    GENERAL: The patient is a well-nourished female, in no acute distress. The vital signs are documented above. CARDIOVASCULAR: There is a regular rate and rhythm. PULMONARY: Non-labored respirations   STUDIES:   +-----------------+-------------+----------+---------+  Right Cephalic  Diameter (cm)Depth (cm)Findings   +-----------------+-------------+----------+---------+  Mid upper arm   0.28 / NV             +-----------------+-------------+----------+---------+  Dist upper arm    0.31               +-----------------+-------------+----------+---------+  Antecubital fossa  0.30               +-----------------+-------------+----------+---------+  Prox forearm    0.28 / 0.31             +-----------------+-------------+----------+---------+  Mid  forearm     0.45               +-----------------+-------------+----------+---------+  Dist forearm     0.32        branching  +-----------------+-------------+----------+---------+   +-----------------+-------------+----------+--------------+  Right Basilic  Diameter (cm)Depth (cm)  Findings    +-----------------+-------------+----------+--------------+  Shoulder                 not visualized  +-----------------+-------------+----------+--------------+  Prox upper arm              not visualized  +-----------------+-------------+----------+--------------+  Mid upper arm    0.53                 +-----------------+-------------+----------+--------------+  Dist upper arm    0.51                 +-----------------+-------------+----------+--------------+  Antecubital fossa  0.56         branching    +-----------------+-------------+----------+--------------+  Prox forearm     0.17                 +-----------------+-------------+----------+--------------+   +-----------------+-------------+----------+--------+  Left Cephalic  Diameter (cm)Depth (cm)Findings  +-----------------+-------------+----------+--------+  Prox upper arm    0.41        tortuous  +-----------------+-------------+----------+--------+  Mid upper arm    0.37              +-----------------+-------------+----------+--------+  Dist upper arm    0.33              +-----------------+-------------+----------+--------+  Antecubital fossa  0.33              +-----------------+-------------+----------+--------+  Prox forearm     0.42              +-----------------+-------------+----------+--------+  Mid forearm    0.42 / 0.31              +-----------------+-------------+----------+--------+  Dist forearm     0.47              +-----------------+-------------+----------+--------+   +-----------------+-------------+----------+--------+  Left Basilic   Diameter (cm)Depth (cm)Findings  +-----------------+-------------+----------+--------+  Prox upper arm               graft   +-----------------+-------------+----------+--------+  Mid upper arm               graft   +-----------------+-------------+----------+--------+  Dist upper arm               graft   +-----------------+-------------+----------+--------+  Antecubital fossa             graft   +-----------------+-------------+----------+--------+  Prox forearm  0.67              +-----------------+-------------+----------+--------+   ABI   +-------+-----------+-----------+------------+------------+  Right 1.34    0.84                  +-------+-----------+-----------+------------+------------+  Left  [place]     0.88                   Right toe:  119 Left toe 125 +-------+-----------+-----------+------------+------------+    MEDICAL ISSUES:   ESRD: The patient is refusing a right upper extremity access given that that is her only functioning arm after the steal has affected her left arm.  We discussed placing a thigh graft.  She has had cardiac caths in the past on the right and so I will go into her left groin for right thigh graft.  I discussed the details of the operation.  Depending on how she does I may consider keeping her overnight.  We talked about the risk of steal in the thigh graft.  We also talked about the risk of infection and the importance of keeping the area clean and dry.  Her procedure is scheduled for Friday, May 22  Leia Alf, MD, FACS [facility] [phone] Pager [phone]

## 2019-06-13 ENCOUNTER — Other Ambulatory Visit (HOSPITAL_COMMUNITY)
Admission: RE | Admit: 2019-06-13 | Discharge: 2019-06-13 | Disposition: A | Discharge status: Home / Self Care | Payer: Medicare HMO | Source: Ambulatory Visit | Attending: Surgery | Admitting: Surgery

## 2019-06-13 DIAGNOSIS — Z20822 Contact with and (suspected) exposure to covid-19: Secondary | ICD-10-CM | POA: Diagnosis not present

## 2019-06-13 DIAGNOSIS — Z01812 Encounter for preprocedural laboratory examination: Secondary | ICD-10-CM | POA: Insufficient documentation

## 2019-06-13 LAB — SARS CORONAVIRUS 2 (TAT 6-24 HRS): SARS Coronavirus 2: NEGATIVE

## 2019-06-15 ENCOUNTER — Encounter (HOSPITAL_COMMUNITY): Payer: Self-pay | Admitting: Surgery

## 2019-06-15 ENCOUNTER — Other Ambulatory Visit: Payer: Self-pay

## 2019-06-15 NOTE — Anesthesia Preprocedure Evaluation (Addendum)
Anesthesia Evaluation  Patient identified by MRN, date of birth, ID band Patient awake    Reviewed: Allergy & Precautions, H&P , NPO status , Patient's Chart, lab work & pertinent test results  History of Anesthesia Complications (+) PONV  Airway Mallampati: II  TM Distance: >3 FB Neck ROM: Full    Dental no notable dental hx. (+) Partial Lower, Partial Upper, Dental Advisory Given   Pulmonary asthma , sleep apnea ,    Pulmonary exam normal breath sounds clear to auscultation       Cardiovascular Exercise Tolerance: Good hypertension, Pt. on medications + CAD, + Past MI and +CHF  + dysrhythmias Atrial Fibrillation  Rhythm:Regular Rate:Normal     Neuro/Psych  Headaches, Anxiety Depression Dementia    GI/Hepatic Neg liver ROS, hiatal hernia, GERD  ,  Endo/Other  diabetes, Insulin Dependent, Oral Hypoglycemic AgentsHypothyroidism Morbid obesity  Renal/GU ESRF and DialysisRenal disease  negative genitourinary   Musculoskeletal  (+) Arthritis , Osteoarthritis,    Abdominal   Peds  Hematology  (+) Blood dyscrasia, anemia ,   Anesthesia Other Findings   Reproductive/Obstetrics negative OB ROS                            Anesthesia Physical Anesthesia Plan  ASA: III  Anesthesia Plan: General   Post-op Pain Management:    Induction: Intravenous  PONV Risk Score and Plan: 4 or greater and Ondansetron and Treatment may vary due to age or medical condition  Airway Management Planned: Oral ETT  Additional Equipment:   Intra-op Plan:   Post-operative Plan: Extubation in OR  Informed Consent: I have reviewed the patients History and Physical, chart, labs and discussed the procedure including the risks, benefits and alternatives for the proposed anesthesia with the patient or authorized representative who has indicated his/her understanding and acceptance.     Dental advisory  given  Plan Discussed with: CRNA  Anesthesia Plan Comments:         Anesthesia Quick Evaluation

## 2019-06-15 NOTE — Progress Notes (Addendum)
[name] denies chest pain or shortness of breath at this time. Patient has chronic asthma and is short of breath with much moving- this is her baseline. Patient wears oxygen at 2 - 2.5 liters 24/7.  [name] Patient tested negative for Covid_5/18/2021_ and has been in quarantine since that time.  Patient has been to dialysis following Covid guidelines.  [name] has type II diabetes, patientdoes not take Lantus except she does take HS or Glypizide on dialysis days; TTHSAT.  Patient is going to call me with her sliding scale when she gets home. I instructed patient to check CBG after awaking and every 2 hours until arrival  to the hospital.  I Instructed patient if CBG is less than 70 to take 4 Glucose tablets.Recheck CBG in 15 minutes then call pre- op desk at [phone] for further instructions. If scheduled to receive Insulin, do not take Insulin.  Patient called back and reviewed Lantus and Glypizide . Patient does not take Glypizide on the day of dialysis.  Patient does not take the lunch does on dialysis days, she does take HS dose of Lantus, I instructed patient to only take 2 units of Lantus tonight. [name] reports that she has chest pain while she is on dialysis and her blood pressure drops; "they have to take me off the machine for a while and start it back after the pain goes away."  [name] reports that her cardiologist is aware.

## 2019-06-16 ENCOUNTER — Other Ambulatory Visit: Payer: Self-pay

## 2019-06-16 ENCOUNTER — Ambulatory Visit (HOSPITAL_COMMUNITY): Payer: Medicare HMO

## 2019-06-16 ENCOUNTER — Encounter (HOSPITAL_COMMUNITY)
Admission: AD | Disposition: A | Discharge status: Home / Self Care | Payer: Self-pay | Source: Home / Self Care | Attending: Surgery

## 2019-06-16 ENCOUNTER — Encounter (HOSPITAL_COMMUNITY): Payer: Self-pay | Admitting: Surgery

## 2019-06-16 ENCOUNTER — Observation Stay (HOSPITAL_COMMUNITY)
Admission: AD | Admit: 2019-06-16 | Discharge: 2019-06-17 | Disposition: A | Discharge status: Home / Self Care | Payer: Medicare HMO | Attending: Surgery | Admitting: Surgery

## 2019-06-16 DIAGNOSIS — I428 Other cardiomyopathies: Secondary | ICD-10-CM | POA: Insufficient documentation

## 2019-06-16 DIAGNOSIS — Z885 Allergy status to narcotic agent status: Secondary | ICD-10-CM | POA: Diagnosis not present

## 2019-06-16 DIAGNOSIS — N186 End stage renal disease: Secondary | ICD-10-CM | POA: Diagnosis present

## 2019-06-16 DIAGNOSIS — I4819 Other persistent atrial fibrillation: Secondary | ICD-10-CM | POA: Diagnosis not present

## 2019-06-16 DIAGNOSIS — F419 Anxiety disorder, unspecified: Secondary | ICD-10-CM | POA: Insufficient documentation

## 2019-06-16 DIAGNOSIS — F0390 Unspecified dementia without behavioral disturbance: Secondary | ICD-10-CM | POA: Diagnosis not present

## 2019-06-16 DIAGNOSIS — I132 Hypertensive heart and chronic kidney disease with heart failure and with stage 5 chronic kidney disease, or end stage renal disease: Principal | ICD-10-CM | POA: Insufficient documentation

## 2019-06-16 DIAGNOSIS — E1122 Type 2 diabetes mellitus with diabetic chronic kidney disease: Secondary | ICD-10-CM | POA: Insufficient documentation

## 2019-06-16 DIAGNOSIS — E559 Vitamin D deficiency, unspecified: Secondary | ICD-10-CM | POA: Insufficient documentation

## 2019-06-16 DIAGNOSIS — Z888 Allergy status to other drugs, medicaments and biological substances status: Secondary | ICD-10-CM | POA: Insufficient documentation

## 2019-06-16 DIAGNOSIS — G4730 Sleep apnea, unspecified: Secondary | ICD-10-CM | POA: Diagnosis not present

## 2019-06-16 DIAGNOSIS — Z992 Dependence on renal dialysis: Secondary | ICD-10-CM | POA: Diagnosis not present

## 2019-06-16 DIAGNOSIS — E039 Hypothyroidism, unspecified: Secondary | ICD-10-CM | POA: Diagnosis not present

## 2019-06-16 DIAGNOSIS — Z79899 Other long term (current) drug therapy: Secondary | ICD-10-CM | POA: Insufficient documentation

## 2019-06-16 DIAGNOSIS — Z794 Long term (current) use of insulin: Secondary | ICD-10-CM | POA: Insufficient documentation

## 2019-06-16 DIAGNOSIS — Z880 Allergy status to penicillin: Secondary | ICD-10-CM | POA: Insufficient documentation

## 2019-06-16 DIAGNOSIS — Z9981 Dependence on supplemental oxygen: Secondary | ICD-10-CM | POA: Diagnosis not present

## 2019-06-16 DIAGNOSIS — I4892 Unspecified atrial flutter: Secondary | ICD-10-CM | POA: Insufficient documentation

## 2019-06-16 DIAGNOSIS — E785 Hyperlipidemia, unspecified: Secondary | ICD-10-CM | POA: Insufficient documentation

## 2019-06-16 DIAGNOSIS — E1143 Type 2 diabetes mellitus with diabetic autonomic (poly)neuropathy: Secondary | ICD-10-CM | POA: Insufficient documentation

## 2019-06-16 DIAGNOSIS — Z6841 Body Mass Index (BMI) 40.0 and over, adult: Secondary | ICD-10-CM | POA: Diagnosis not present

## 2019-06-16 DIAGNOSIS — I252 Old myocardial infarction: Secondary | ICD-10-CM | POA: Insufficient documentation

## 2019-06-16 DIAGNOSIS — Z79890 Hormone replacement therapy (postmenopausal): Secondary | ICD-10-CM | POA: Insufficient documentation

## 2019-06-16 DIAGNOSIS — I5032 Chronic diastolic (congestive) heart failure: Secondary | ICD-10-CM | POA: Insufficient documentation

## 2019-06-16 DIAGNOSIS — Z7901 Long term (current) use of anticoagulants: Secondary | ICD-10-CM | POA: Insufficient documentation

## 2019-06-16 DIAGNOSIS — Z881 Allergy status to other antibiotic agents status: Secondary | ICD-10-CM | POA: Insufficient documentation

## 2019-06-16 DIAGNOSIS — J45909 Unspecified asthma, uncomplicated: Secondary | ICD-10-CM | POA: Insufficient documentation

## 2019-06-16 DIAGNOSIS — N185 Chronic kidney disease, stage 5: Secondary | ICD-10-CM | POA: Diagnosis not present

## 2019-06-16 DIAGNOSIS — I2510 Atherosclerotic heart disease of native coronary artery without angina pectoris: Secondary | ICD-10-CM | POA: Diagnosis not present

## 2019-06-16 DIAGNOSIS — K219 Gastro-esophageal reflux disease without esophagitis: Secondary | ICD-10-CM | POA: Diagnosis not present

## 2019-06-16 HISTORY — DX: Dyspnea, unspecified: R06.00

## 2019-06-16 HISTORY — PX: AV FISTULA PLACEMENT: SHX1204

## 2019-06-16 LAB — SURGICAL PCR SCREEN
MRSA, PCR: NEGATIVE
Staphylococcus aureus: NEGATIVE

## 2019-06-16 LAB — POCT I-STAT, CHEM 8
BUN: 17 mg/dL (ref 8–23)
Calcium, Ion: 1.09 mmol/L — ABNORMAL LOW (ref 1.15–1.40)
Chloride: 98 mmol/L (ref 98–111)
Creatinine, Ser: 3.7 mg/dL — ABNORMAL HIGH (ref 0.44–1.00)
Glucose, Bld: 133 mg/dL — ABNORMAL HIGH (ref 70–99)
HCT: 42 % (ref 36.0–46.0)
Hemoglobin: 14.3 g/dL (ref 12.0–15.0)
Potassium: 3.5 mmol/L (ref 3.5–5.1)
Sodium: 138 mmol/L (ref 135–145)
TCO2: 33 mmol/L — ABNORMAL HIGH (ref 22–32)

## 2019-06-16 LAB — GLUCOSE, CAPILLARY
Glucose-Capillary: 122 mg/dL — ABNORMAL HIGH (ref 70–99)
Glucose-Capillary: 159 mg/dL — ABNORMAL HIGH (ref 70–99)
Glucose-Capillary: 238 mg/dL — ABNORMAL HIGH (ref 70–99)
Glucose-Capillary: 265 mg/dL — ABNORMAL HIGH (ref 70–99)
Glucose-Capillary: 181 mg/dL — ABNORMAL HIGH (ref 70–99)

## 2019-06-16 LAB — HEMOGLOBIN A1C
Hgb A1c MFr Bld: 6 % — ABNORMAL HIGH (ref 4.8–5.6)
Mean Plasma Glucose: 125.5 mg/dL

## 2019-06-16 SURGERY — INSERTION OF ARTERIOVENOUS (AV) GORE-TEX GRAFT THIGH
Anesthesia: General | Site: Thigh | Laterality: Left

## 2019-06-16 MED ORDER — ONDANSETRON HCL 4 MG/2ML IJ SOLN
INTRAMUSCULAR | Status: AC
  Filled 2019-06-16: qty 2

## 2019-06-16 MED ORDER — PROPOFOL 10 MG/ML IV BOLUS
INTRAVENOUS | Status: DC | PRN
  Administered 2019-06-16: 50 mg via INTRAVENOUS

## 2019-06-16 MED ORDER — PROTAMINE SULFATE 10 MG/ML IV SOLN
INTRAVENOUS | Status: DC | PRN
  Administered 2019-06-16: 50 mg via INTRAVENOUS

## 2019-06-16 MED ORDER — SODIUM CHLORIDE 0.9 % IV SOLN: INTRAVENOUS | Status: DC

## 2019-06-16 MED ORDER — DEXAMETHASONE SODIUM PHOSPHATE 10 MG/ML IJ SOLN
INTRAMUSCULAR | Status: DC | PRN
Start: 2019-06-16 — End: 2019-06-16
  Administered 2019-06-16: 5 mg via INTRAVENOUS

## 2019-06-16 MED ORDER — PHENOL 1.4 % MT LIQD: 1.0000 | OROMUCOSAL | Status: DC | PRN

## 2019-06-16 MED ORDER — LIDOCAINE 2% (20 MG/ML) 5 ML SYRINGE
INTRAMUSCULAR | Status: DC | PRN
  Administered 2019-06-16: 60 mg via INTRAVENOUS

## 2019-06-16 MED ORDER — ONDANSETRON HCL 4 MG/2ML IJ SOLN
INTRAMUSCULAR | Status: DC | PRN
  Administered 2019-06-16: 4 mg via INTRAVENOUS

## 2019-06-16 MED ORDER — HEPARIN SODIUM (PORCINE) 1000 UNIT/ML IJ SOLN
INTRAMUSCULAR | Status: DC | PRN
  Administered 2019-06-16: 5000 [IU] via INTRAVENOUS

## 2019-06-16 MED ORDER — RENA-VITE PO TABS
1.0000 | ORAL_TABLET | Freq: Every day | ORAL | Status: DC
  Administered 2019-06-16: 1 via ORAL
  Filled 2019-06-16: qty 1

## 2019-06-16 MED ORDER — FENTANYL CITRATE (PF) 250 MCG/5ML IJ SOLN
INTRAMUSCULAR | Status: AC
  Filled 2019-06-16: qty 5

## 2019-06-16 MED ORDER — DEXAMETHASONE SODIUM PHOSPHATE 10 MG/ML IJ SOLN
INTRAMUSCULAR | Status: AC
  Filled 2019-06-16: qty 1

## 2019-06-16 MED ORDER — ACETAMINOPHEN 650 MG RE SUPP: 325.0000 mg | RECTAL | Status: DC | PRN

## 2019-06-16 MED ORDER — CHLORHEXIDINE GLUCONATE 4 % EX LIQD: 60.0000 mL | Freq: Once | CUTANEOUS | Status: DC

## 2019-06-16 MED ORDER — CLONAZEPAM 0.5 MG PO TABS: 0.5000 mg | ORAL_TABLET | Freq: Three times a day (TID) | ORAL | Status: DC | PRN

## 2019-06-16 MED ORDER — AMIODARONE HCL 200 MG PO TABS
200.0000 mg | ORAL_TABLET | Freq: Every day | ORAL | Status: DC
  Administered 2019-06-17: 200 mg via ORAL
  Filled 2019-06-16: qty 1

## 2019-06-16 MED ORDER — POTASSIUM CHLORIDE CRYS ER 20 MEQ PO TBCR
20.0000 meq | EXTENDED_RELEASE_TABLET | Freq: Once | ORAL | Status: AC
  Administered 2019-06-16: 20 meq via ORAL
  Filled 2019-06-16: qty 1

## 2019-06-16 MED ORDER — LABETALOL HCL 5 MG/ML IV SOLN: 10.0000 mg | INTRAVENOUS | Status: DC | PRN

## 2019-06-16 MED ORDER — DOXYCYCLINE HYCLATE 100 MG PO TABS
100.0000 mg | ORAL_TABLET | Freq: Two times a day (BID) | ORAL | Status: DC
  Administered 2019-06-16 – 2019-06-17 (×2): 100 mg via ORAL
  Filled 2019-06-16 (×2): qty 1

## 2019-06-16 MED ORDER — HYDRALAZINE HCL 20 MG/ML IJ SOLN: 5.0000 mg | INTRAMUSCULAR | Status: DC | PRN

## 2019-06-16 MED ORDER — FERRIC CITRATE 1 GM 210 MG(FE) PO TABS
210.0000 mg | ORAL_TABLET | Freq: Three times a day (TID) | ORAL | Status: DC
  Administered 2019-06-16 – 2019-06-17 (×3): 210 mg via ORAL
  Filled 2019-06-16 (×3): qty 1

## 2019-06-16 MED ORDER — PHENYLEPHRINE HCL (PRESSORS) 10 MG/ML IV SOLN
INTRAVENOUS | Status: DC | PRN
  Administered 2019-06-16: 120 ug via INTRAVENOUS

## 2019-06-16 MED ORDER — HYDROMORPHONE HCL 1 MG/ML IJ SOLN: 0.5000 mg | INTRAMUSCULAR | Status: DC | PRN

## 2019-06-16 MED ORDER — ONDANSETRON HCL 4 MG/2ML IJ SOLN: 4.0000 mg | Freq: Four times a day (QID) | INTRAMUSCULAR | Status: DC | PRN

## 2019-06-16 MED ORDER — POLYETHYLENE GLYCOL 3350 17 G PO PACK: 17.0000 g | PACK | Freq: Every day | ORAL | Status: DC | PRN

## 2019-06-16 MED ORDER — CLINDAMYCIN PHOSPHATE 900 MG/50ML IV SOLN
INTRAVENOUS | Status: DC | PRN
Start: 2019-06-16 — End: 2019-06-16
  Administered 2019-06-16: 900 mg via INTRAVENOUS

## 2019-06-16 MED ORDER — METOPROLOL TARTRATE 5 MG/5ML IV SOLN: 2.0000 mg | INTRAVENOUS | Status: DC | PRN

## 2019-06-16 MED ORDER — SODIUM CHLORIDE 0.9 % IV SOLN
INTRAVENOUS | Status: DC | PRN
  Administered 2019-06-16: 250 mL via INTRAVENOUS

## 2019-06-16 MED ORDER — LIDOCAINE 2% (20 MG/ML) 5 ML SYRINGE
INTRAMUSCULAR | Status: AC
  Filled 2019-06-16: qty 5

## 2019-06-16 MED ORDER — CHLORHEXIDINE GLUCONATE 0.12 % MT SOLN: 15.0000 mL | Freq: Once | OROMUCOSAL | Status: AC

## 2019-06-16 MED ORDER — FENTANYL CITRATE (PF) 100 MCG/2ML IJ SOLN
INTRAMUSCULAR | Status: AC
  Filled 2019-06-16: qty 2

## 2019-06-16 MED ORDER — ACETAMINOPHEN 325 MG PO TABS
325.0000 mg | ORAL_TABLET | ORAL | Status: DC | PRN
  Administered 2019-06-17: 650 mg via ORAL
  Filled 2019-06-16: qty 2

## 2019-06-16 MED ORDER — FAMOTIDINE 20 MG PO TABS
40.0000 mg | ORAL_TABLET | Freq: Every day | ORAL | Status: DC
  Administered 2019-06-17: 40 mg via ORAL
  Filled 2019-06-16: qty 2

## 2019-06-16 MED ORDER — SODIUM CHLORIDE 0.9 % IV SOLN
INTRAVENOUS | Status: AC
  Filled 2019-06-16: qty 1.2

## 2019-06-16 MED ORDER — ESTRADIOL 1 MG PO TABS
1.0000 mg | ORAL_TABLET | Freq: Every day | ORAL | Status: DC
  Administered 2019-06-17: 1 mg via ORAL
  Filled 2019-06-16: qty 1

## 2019-06-16 MED ORDER — LEVOTHYROXINE SODIUM 75 MCG PO TABS
150.0000 ug | ORAL_TABLET | Freq: Every day | ORAL | Status: DC
  Administered 2019-06-17: 150 ug via ORAL
  Filled 2019-06-16: qty 2

## 2019-06-16 MED ORDER — PHENYLEPHRINE 40 MCG/ML (10ML) SYRINGE FOR IV PUSH (FOR BLOOD PRESSURE SUPPORT)
PREFILLED_SYRINGE | INTRAVENOUS | Status: AC
  Filled 2019-06-16: qty 10

## 2019-06-16 MED ORDER — LORATADINE 10 MG PO TABS
10.0000 mg | ORAL_TABLET | Freq: Every day | ORAL | Status: DC
  Administered 2019-06-17: 10 mg via ORAL
  Filled 2019-06-16: qty 1

## 2019-06-16 MED ORDER — 0.9 % SODIUM CHLORIDE (POUR BTL) OPTIME
TOPICAL | Status: DC | PRN
  Administered 2019-06-16: 1000 mL

## 2019-06-16 MED ORDER — VITAMIN B-12 100 MCG PO TABS
500.0000 ug | ORAL_TABLET | Freq: Every day | ORAL | Status: DC
  Administered 2019-06-17: 500 ug via ORAL
  Filled 2019-06-16: qty 5

## 2019-06-16 MED ORDER — PROPOFOL 10 MG/ML IV BOLUS
INTRAVENOUS | Status: AC
  Filled 2019-06-16: qty 40

## 2019-06-16 MED ORDER — ROCURONIUM BROMIDE 10 MG/ML (PF) SYRINGE
PREFILLED_SYRINGE | INTRAVENOUS | Status: DC | PRN
  Administered 2019-06-16: 40 mg via INTRAVENOUS

## 2019-06-16 MED ORDER — CHLORHEXIDINE GLUCONATE 0.12 % MT SOLN
OROMUCOSAL | Status: AC
  Administered 2019-06-16: 15 mL via OROMUCOSAL
  Filled 2019-06-16: qty 15

## 2019-06-16 MED ORDER — TRAMADOL HCL 50 MG PO TABS: 50.0000 mg | ORAL_TABLET | Freq: Two times a day (BID) | ORAL | Status: DC | PRN

## 2019-06-16 MED ORDER — SIMVASTATIN 20 MG PO TABS
20.0000 mg | ORAL_TABLET | Freq: Every day | ORAL | Status: DC
  Administered 2019-06-16: 20 mg via ORAL
  Filled 2019-06-16: qty 1

## 2019-06-16 MED ORDER — BISACODYL 10 MG RE SUPP: 10.0000 mg | Freq: Every day | RECTAL | Status: DC | PRN

## 2019-06-16 MED ORDER — ORAL CARE MOUTH RINSE: 15.0000 mL | Freq: Two times a day (BID) | OROMUCOSAL | Status: DC

## 2019-06-16 MED ORDER — LIDOCAINE-EPINEPHRINE (PF) 1 %-1:200000 IJ SOLN
INTRAMUSCULAR | Status: AC
  Filled 2019-06-16: qty 30

## 2019-06-16 MED ORDER — NEOSTIGMINE METHYLSULFATE 10 MG/10ML IV SOLN
INTRAVENOUS | Status: DC | PRN
  Administered 2019-06-16: 4 mg via INTRAVENOUS

## 2019-06-16 MED ORDER — ALBUMIN HUMAN 5 % IV SOLN: INTRAVENOUS | Status: DC | PRN

## 2019-06-16 MED ORDER — SODIUM CHLORIDE 0.9 % IV SOLN
INTRAVENOUS | Status: DC | PRN
  Administered 2019-06-16: 500 mL

## 2019-06-16 MED ORDER — ROCURONIUM BROMIDE 10 MG/ML (PF) SYRINGE
PREFILLED_SYRINGE | INTRAVENOUS | Status: AC
  Filled 2019-06-16: qty 10

## 2019-06-16 MED ORDER — FENTANYL CITRATE (PF) 100 MCG/2ML IJ SOLN
25.0000 ug | INTRAMUSCULAR | Status: DC | PRN
  Administered 2019-06-16: 25 ug via INTRAVENOUS

## 2019-06-16 MED ORDER — GLIPIZIDE ER 10 MG PO TB24
10.0000 mg | ORAL_TABLET | ORAL | Status: DC
  Filled 2019-06-16: qty 1

## 2019-06-16 MED ORDER — PHENYLEPHRINE HCL-NACL 10-0.9 MG/250ML-% IV SOLN
INTRAVENOUS | Status: DC | PRN
  Administered 2019-06-16: 60 ug/min via INTRAVENOUS

## 2019-06-16 MED ORDER — GLYCOPYRROLATE 0.2 MG/ML IJ SOLN
INTRAMUSCULAR | Status: DC | PRN
Start: 2019-06-16 — End: 2019-06-16
  Administered 2019-06-16: .6 mg via INTRAVENOUS

## 2019-06-16 MED ORDER — GUAIFENESIN-DM 100-10 MG/5ML PO SYRP: 15.0000 mL | ORAL_SOLUTION | ORAL | Status: DC | PRN

## 2019-06-16 MED ORDER — ALUM & MAG HYDROXIDE-SIMETH 200-200-20 MG/5ML PO SUSP: 15.0000 mL | ORAL | Status: DC | PRN

## 2019-06-16 MED ORDER — INSULIN ASPART 100 UNIT/ML ~~LOC~~ SOLN
0.0000 [IU] | Freq: Three times a day (TID) | SUBCUTANEOUS | Status: DC
  Administered 2019-06-16: 8 [IU] via SUBCUTANEOUS
  Administered 2019-06-17 (×2): 3 [IU] via SUBCUTANEOUS

## 2019-06-16 MED ORDER — ACETAMINOPHEN 500 MG PO TABS
1000.0000 mg | ORAL_TABLET | Freq: Once | ORAL | Status: AC
  Administered 2019-06-16: 1000 mg via ORAL
  Filled 2019-06-16: qty 2

## 2019-06-16 MED ORDER — FENTANYL CITRATE (PF) 100 MCG/2ML IJ SOLN
INTRAMUSCULAR | Status: DC | PRN
  Administered 2019-06-16 (×2): 25 ug via INTRAVENOUS

## 2019-06-16 MED ORDER — ORAL CARE MOUTH RINSE: 15.0000 mL | Freq: Once | OROMUCOSAL | Status: AC

## 2019-06-16 MED ORDER — ONDANSETRON HCL 4 MG PO TABS: 4.0000 mg | ORAL_TABLET | Freq: Three times a day (TID) | ORAL | Status: DC | PRN

## 2019-06-16 MED ORDER — CLINDAMYCIN PHOSPHATE 600 MG/50ML IV SOLN
600.0000 mg | Freq: Three times a day (TID) | INTRAVENOUS | Status: AC
  Administered 2019-06-16 (×2): 600 mg via INTRAVENOUS
  Filled 2019-06-16 (×2): qty 50

## 2019-06-16 SURGICAL SUPPLY — 40 items
ADH SKN CLS APL DERMABOND .7 (GAUZE/BANDAGES/DRESSINGS) ×1
CANISTER SUCT 3000ML PPV (MISCELLANEOUS) ×3 IMPLANT
CANISTER WOUND CARE 500ML ATS (WOUND CARE) ×2 IMPLANT
CLIP VESOCCLUDE MED 6/CT (CLIP) ×3 IMPLANT
CLIP VESOCCLUDE SM WIDE 6/CT (CLIP) ×3 IMPLANT
COVER WAND RF STERILE (DRAPES) ×3 IMPLANT
DERMABOND ADVANCED (GAUZE/BANDAGES/DRESSINGS) ×2
DERMABOND ADVANCED .7 DNX12 (GAUZE/BANDAGES/DRESSINGS) ×1 IMPLANT
DRAPE INCISE IOBAN 66X45 STRL (DRAPES) ×3 IMPLANT
ELECT BLADE 4.0 EZ CLEAN MEGAD (MISCELLANEOUS) ×3
ELECT REM PT RETURN 9FT ADLT (ELECTROSURGICAL) ×3
ELECTRODE BLDE 4.0 EZ CLN MEGD (MISCELLANEOUS) IMPLANT
ELECTRODE REM PT RTRN 9FT ADLT (ELECTROSURGICAL) ×1 IMPLANT
GLOVE BIOGEL PI IND STRL 7.5 (GLOVE) ×1 IMPLANT
GLOVE BIOGEL PI INDICATOR 7.5 (GLOVE) ×2
GLOVE SURG SS PI 7.5 STRL IVOR (GLOVE) ×3 IMPLANT
GOWN STRL REUS W/ TWL LRG LVL3 (GOWN DISPOSABLE) ×2 IMPLANT
GOWN STRL REUS W/ TWL XL LVL3 (GOWN DISPOSABLE) ×1 IMPLANT
GOWN STRL REUS W/TWL LRG LVL3 (GOWN DISPOSABLE) ×6
GOWN STRL REUS W/TWL XL LVL3 (GOWN DISPOSABLE) ×3
GRAFT GORETEX STRT 6X50 (Vascular Products) ×2 IMPLANT
HEMOSTAT SNOW SURGICEL 2X4 (HEMOSTASIS) ×2 IMPLANT
KIT BASIN OR (CUSTOM PROCEDURE TRAY) ×3 IMPLANT
KIT DRSG PREVENA PLUS 7DAY 125 (MISCELLANEOUS) ×2 IMPLANT
KIT PREVENA INCISION MGT 13 (CANNISTER) ×2 IMPLANT
KIT TURNOVER KIT B (KITS) ×3 IMPLANT
NS IRRIG 1000ML POUR BTL (IV SOLUTION) ×3 IMPLANT
PACK CV ACCESS (CUSTOM PROCEDURE TRAY) ×3 IMPLANT
PAD ARMBOARD 7.5X6 YLW CONV (MISCELLANEOUS) ×6 IMPLANT
SUT PROLENE 6 0 BV (SUTURE) ×5 IMPLANT
SUT SILK 2 0 PERMA HAND 18 BK (SUTURE) ×2 IMPLANT
SUT SILK 2 0 SH (SUTURE) IMPLANT
SUT VIC AB 2-0 CT1 27 (SUTURE) ×6
SUT VIC AB 2-0 CT1 TAPERPNT 27 (SUTURE) IMPLANT
SUT VIC AB 3-0 SH 27 (SUTURE) ×3
SUT VIC AB 3-0 SH 27X BRD (SUTURE) ×1 IMPLANT
SUT VICRYL 4-0 PS2 18IN ABS (SUTURE) ×3 IMPLANT
TOWEL GREEN STERILE (TOWEL DISPOSABLE) ×3 IMPLANT
UNDERPAD 30X36 HEAVY ABSORB (UNDERPADS AND DIAPERS) ×3 IMPLANT
WATER STERILE IRR 1000ML POUR (IV SOLUTION) ×3 IMPLANT

## 2019-06-16 NOTE — Transfer of Care (Signed)
Immediate Anesthesia Transfer of Care Note  Patient: [name]  Procedure(s) Performed: INSERTION OF ARTERIOVENOUS (AV) GORE-TEX GRAFT THIGH (Left Thigh)  Patient Location: PACU  Anesthesia Type:General  Level of Consciousness: awake, alert  and oriented  Airway & Oxygen Therapy: Patient Spontanous Breathing and Patient connected to face mask oxygen  Post-op Assessment: Report given to RN and Post -op Vital signs reviewed and stable  Post vital signs: Reviewed and stable  Last Vitals:  Vitals Value Taken Time  BP 123/75 06/16/19 0947  Temp 36.7 C 06/16/19 0945  Pulse 86 06/16/19 0953  Resp 20 06/16/19 0953  SpO2 94 % 06/16/19 0953  Vitals shown include unvalidated device data.  Last Pain:  Vitals:   06/16/19 0945  TempSrc:   PainSc: (P) 0-No pain         Complications: No apparent anesthesia complications

## 2019-06-16 NOTE — TOC Initial Note (Addendum)
Transition of Care [facility]) - Initial/Assessment Note    Patient Details  Name: [name] MRN: [id number] Date of Birth: [birth year]  [facility] CM/SW Contact:    Bartholomew Crews, RN Phone Number: [phone] 06/16/2019, 1:24 PM  Clinical Narrative:                  Spoke with patient at the bedside. PTA home with daughter and son in law. Goes to HD TTS at [facility]. Anticipate discharge tomorrow morning in time time to get to outpatient hemodialysis - usual seat 11:30.   Patient and daughter expressed concerns about outpatient dialysis tomorrow. Renal navigator discussed transition options with patient and daughter.   Daughter discussed need for wheelchair and having prescription. Referral sent to [facility] with DME order and progress note pending MD signature.  TOC following for transition needs.   Expected Discharge Plan: Home/Self Care Barriers to Discharge: Continued Medical Work up   Patient Goals and CMS Choice Patient states their goals for this hospitalization and ongoing recovery are:: return home with daughter CMS Medicare.gov Compare Post Acute Care list provided to:: Patient Choice offered to / list presented to : NA  Expected Discharge Plan and Services Expected Discharge Plan: Home/Self Care In-house Referral: NA Discharge Planning Services: CM Consult Post Acute Care Choice: NA Living arrangements for the past 2 months: Single Family Home                 DME Arranged: N/A DME Agency: NA       HH Arranged: NA HH Agency: NA        Prior Living Arrangements/Services Living arrangements for the past 2 months: Single Family Home Lives with:: Self, Adult Children Patient language and need for interpreter reviewed:: Yes Do you feel safe going back to the place where you live?: Yes      Need for Family Participation in Patient Care: Yes (Comment) Care giver support system in place?: Yes (comment) Current home services: DME(walker) Criminal  Activity/Legal Involvement Pertinent to Current Situation/Hospitalization: No - Comment as needed  Activities of Daily Living Home Assistive Devices/Equipment: Walker (specify type), Built-in shower seat, Eyeglasses, Oxygen, Wheelchair, CBG Meter, Grab bars in shower, Blood pressure cuff ADL Screening (condition at time of admission) Patient's cognitive ability adequate to safely complete daily activities?: Yes Is the patient deaf or have difficulty hearing?: Yes Does the patient have difficulty seeing, even when wearing glasses/contacts?: No Does the patient have difficulty concentrating, remembering, or making decisions?: No Patient able to express need for assistance with ADLs?: Yes Does the patient have difficulty dressing or bathing?: Yes Independently performs ADLs?: Yes (appropriate for developmental age) Does the patient have difficulty walking or climbing stairs?: No Weakness of Legs: Both Weakness of Arms/Hands: Both  Permission Sought/Granted Permission sought to share information with : Family Supports          Permission granted to share info w Relationship: daughter     Emotional Assessment Appearance:: Appears stated age Attitude/Demeanor/Rapport: Engaged Affect (typically observed): Accepting Orientation: : Oriented to Self, Oriented to  Time, Oriented to Place, Oriented to Situation Alcohol / Substance Use: Not Applicable Psych Involvement: No (comment)  Admission diagnosis:  ESRD (end stage renal disease) ([place]) [N18.6] Patient Active Problem List   Diagnosis Date Noted  . ESRD (end stage renal disease) ([place]) 06/16/2019  . ESRD on dialysis ([place]) 04/10/2019  . Acute on chronic combined systolic and diastolic CHF (congestive heart failure) ([place]) 08/26/2018  . Pulmonary edema  08/25/2018  . DOE (dyspnea on exertion) 08/25/2018  . Chest pain 11/21/2016  . Chronic kidney disease (CKD), active medical management without dialysis, stage 5 ([place])   . Cellulitis  05/30/2015  . Cellulitis, abdominal wall 05/30/2015  . UTI (lower urinary tract infection) 05/30/2015  . Diabetes mellitus with renal manifestation ([place]) 05/30/2015  . 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 CKD (chronic kidney disease), stage III ([place])   . Hypertensive heart disease   . Obesity (BMI 30-39.9)   . Renal mass 02/20/2015  . Atrial flutter ([place]) 12/31/2014  . Persistent atrial fibrillation ([place])   . Atrial fibrillation ([place]) 05/13/2014  . Influenza with respiratory manifestations 04/18/2014  . A-fib ([place]) 04/16/2014  . Arterial hypotension   . Pyrexia   . Renal insufficiency   . Acute on chronic diastolic ACC/AHA stage C congestive heart failure ([place])   . Chronic diastolic CHF (congestive heart failure) ([place]) 09/22/2013  . Acute right-sided CHF (congestive heart failure) ([place]) 08/02/2013  . Bradycardia 08/02/2013  . PAF (paroxysmal atrial fibrillation) ([place]) 01/11/2013  . CAD (coronary artery disease) 01/11/2013  . Elevated troponin 01/11/2013  . Hyperlipidemia 01/11/2013  . HTN (hypertension)   . Syncope 01/10/2013  . DM neuropathy, type II diabetes mellitus ([place]) 01/09/2013  . NSTEMI (non-ST elevated myocardial infarction) ([place]) 01/08/2013  . Obesity, Class III, BMI 40-49.9 (morbid obesity) ([place]) 03/05/2010  . CAROTID STENOSIS 01/29/2010  . UNSPECIFIED TACHYCARDIA 01/29/2010  . PALPITATIONS 01/29/2010  . Hypothyroidism 02/07/2009  . Anxiety state 02/07/2009  . Hypotension 02/07/2009  . Asthma 02/07/2009  . GASTROESOPHAGEAL REFLUX DISEASE, CHRONIC 02/07/2009  . Osteoarthrosis, unspecified whether generalized or localized, unspecified site 02/07/2009  . DYSPHAGIA UNSPECIFIED 02/07/2009  . DIVERTICULITIS, HX OF 02/07/2009   PCP:  Scotty Court, DO Pharmacy:   [facility], [address] [place] [address] Phone: [phone] Fax: [phone]     Social Determinants of Health (SDOH) Interventions     Readmission Risk Interventions No flowsheet data found.

## 2019-06-16 NOTE — Anesthesia Procedure Notes (Signed)
Procedure Name: Intubation Date/Time: 06/16/2019 7:42 AM Performed by: Inda Coke, CRNA Pre-anesthesia Checklist: Patient identified, Emergency Drugs available, Suction available, Patient being monitored and Timeout performed Patient Re-evaluated:Patient Re-evaluated prior to induction Oxygen Delivery Method: Circle system utilized Preoxygenation: Pre-oxygenation with 100% oxygen Induction Type: IV induction Ventilation: Mask ventilation without difficulty and Oral airway inserted - appropriate to patient size Laryngoscope Size: Sabra Heck and 2 Grade View: Grade I Tube type: Oral Tube size: 7.5 mm Number of attempts: 1 Airway Equipment and Method: Stylet Placement Confirmation: ETT inserted through vocal cords under direct vision,  positive ETCO2,  CO2 detector and breath sounds checked- equal and bilateral Secured at: 22 cm Tube secured with: Tape Comments: Performed by Sena Slate, SRNA

## 2019-06-16 NOTE — Care Management Obs Status (Signed)
[place] NOTIFICATION   Patient Details  Name: [name] MRN: [id number] Date of Birth: [birth year]   Medicare Observation Status Notification Given:  Yes    Bartholomew Crews, RN 06/16/2019, 1:00 PM

## 2019-06-16 NOTE — Progress Notes (Signed)
Renal Navigator met with patient, patient's daughter and patient's son in law at bedside to discuss plan for HD treatment tomorrow. [name] immediately asked if patient could have HD in the hospital prior to discharge tomorrow and states that she has already discussed that plan with surgeon. Navigator explained that since patient is being discharged, it is in her best interest to go to her OP HD clinic tomorrow at her regular seat time-per her son in law, whoc provides her transportation, they typically arrive at 11:10am, as this way she will know when to expect her treatment. Navigator explained that a treatment in the hospital cannot be scheduled as critical patients will be treated first and that we try to keep the HD unit clear for patients who cannot receive an OP HD treatment. [name] was very understanding. Renal Navigator states treatment can be done in the hospital if patient is not discharged or truly unable to make it to her OP HD clinic. Patient states she does not plan to do HD tomorrow because she is sore from surgery today and that she did not go to HD after her last access surgery. Navigator told patient that we do not recommend skipping treatment.  Renal Navigator asked patient's family to arrive tomorrow at 10am and see how patient is feeling. Navigator updated updated RN and CM.   Alphonzo Cruise, [place] Renal Navigator [phone]

## 2019-06-16 NOTE — Progress Notes (Signed)
Renal Navigator appreciates call from [facility] regarding plan for patient to stay overnight tonight after placement of thigh graft today. Patient is a TTS HD patient at [facility]. Navigator spoke with Quarry manager at [facility], who states that patient has a normal second shift seat and that she can come tomorrow at her typical seat time.  Navigator has notified Nephrology and RN staff on [facility], where patient will stay overnight. Navigator will attempt to also follow up with patient this afternoon post surgery to discuss plan for OP HD treatment at her clinic tomorrow after discharge.  Alphonzo Cruise, [employer] Renal Navigator [phone]

## 2019-06-16 NOTE — Care Management CC44 (Signed)
Condition Code 44 Documentation Completed  Patient Details  Name: [name] MRN: [id number] Date of Birth: 19-Aug-1940   Condition Code 44 given:  Yes Patient signature on Condition Code 44 notice:  Yes Documentation of 2 MD's agreement:  Yes Code 44 added to claim:  Yes    Bartholomew Crews, RN 06/16/2019, 1:01 PM

## 2019-06-16 NOTE — Progress Notes (Signed)
NEW ADMISSION NOTE New Admission Note:   Arrival Method:  Patient arrived from PACU. Mental Orientation:  Alert and oriented x 4. Telemetry: 51M-12, NSR Assessment: Completed Skin:  Warm, dry and intact, no open areas noted, psoriasis noted all over the body.   IV: Right Hand IV SL. Pain:  Denies any pain currently. Tubes:  N/A Safety Measures: Safety Fall Prevention Plan has been given, discussed and signed Admission: Completed [facility] [place] Orientation: Patient has been orientated to the room, unit and staff.  Family: daughter and son-in-law at bedside  Orders have been reviewed and implemented. Will continue to monitor the patient. Call light has been placed within reach and bed alarm has been activated.   Amaryllis Dyke, RN

## 2019-06-16 NOTE — Progress Notes (Deleted)
Patient coughing at bedside. Patient asked to use home inhaler. [facility] called and agreed patient could use inhaler.  Patient took 2 puffs of albuterol inhaler with no issues, will continue to monitor.  

## 2019-06-16 NOTE — Anesthesia Postprocedure Evaluation (Signed)
Anesthesia Post Note  Patient: [name]  Procedure(s) Performed: INSERTION OF ARTERIOVENOUS (AV) GORE-TEX GRAFT THIGH (Left Thigh)     Patient location during evaluation: PACU Anesthesia Type: General Level of consciousness: awake and alert Pain management: pain level controlled Vital Signs Assessment: post-procedure vital signs reviewed and stable Respiratory status: spontaneous breathing, nonlabored ventilation, respiratory function stable and patient connected to nasal cannula oxygen Cardiovascular status: blood pressure returned to baseline and stable Postop Assessment: no apparent nausea or vomiting Anesthetic complications: no    Last Vitals:  Vitals:   06/16/19 1000 06/16/19 1015  BP: 107/65 (!) 110/57  Pulse: 86 79  Resp: 18 (!) 21  Temp:    SpO2: 95% 96%    Last Pain:  Vitals:   06/16/19 1015  TempSrc:   PainSc: Asleep                 Delrae Hagey,W. EDMOND

## 2019-06-16 NOTE — Interval H&P Note (Signed)
History and Physical Interval Note:  06/16/2019 7:11 AM  [name]  has presented today for surgery, with the diagnosis of END STAGE RENAL DISEASE.  The various methods of treatment have been discussed with the patient and family. After consideration of risks, benefits and other options for treatment, the patient has consented to  Procedure(s): INSERTION OF ARTERIOVENOUS (AV) GORE-TEX GRAFT THIGH (Left) as a surgical intervention.  The patient's history has been reviewed, patient examined, no change in status, stable for surgery.  I have reviewed the patient's chart and labs.  Questions were answered to the patient's satisfaction.     [name]

## 2019-06-16 NOTE — Discharge Summary (Signed)
Discharge Summary    [name] [birth year] 79 y.o. female  [id number]  Admission Date: 06/16/2019  Discharge Date: 06/17/2019  Physician: Serafina Mitchell, MD  Admission Diagnosis: ESRD (end stage renal disease) [facility]) [N18.6]   HPI:   This is a 80 y.o. female who underwent a left arm second stage basilic vein transposition interposition graft on 10/05/2018 with Dr. Donzetta Matters.  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  I ligated her access on 05/19/2019 and placed a catheter.  She states that she is still somewhat tender from where the [facility] was removed.    The pt is right hand dominant.   Pt is on dialysis at the [facility] location T/T/S.    She has afib and is on Eliquis for this.  She is followed by [facility].   She states that she gets nauseated with anesthesia.    Hospital Course:  The patient was admitted to the hospital and taken to the operating room on 06/16/2019 and underwent: Placement of left thigh AVG    The pt tolerated the procedure well and was transported to the PACU in good condition.   On POD 1, pt was doing well with Pravena in tact.  She is going to have HD prior to discharge.  She will f/u with Dr. Trula Slade in a couple of weeks.    The remainder of the hospital course consisted of increasing mobilization and increasing intake of solids without difficulty.  CBC    Component Value Date/Time   WBC 8.0 06/17/2019 0345   RBC 3.46 (L) 06/17/2019 0345   HGB 11.3 (L) 06/17/2019 0345   HCT 36.0 06/17/2019 0345   PLT 160 06/17/2019 0345   MCV 104.0 (H) 06/17/2019 0345   MCH 32.7 06/17/2019 0345   MCHC 31.4 06/17/2019 0345   RDW 14.9 06/17/2019 0345   LYMPHSABS 2.1 08/25/2018 1714   MONOABS 0.8 08/25/2018 1714   EOSABS 0.0 08/25/2018 1714   BASOSABS 0.1 08/25/2018 1714    BMET    Component Value Date/Time   NA 138 06/17/2019 0345   K 4.4 06/17/2019 0345   CL 101 06/17/2019 0345   CO2 25 06/17/2019 0345   GLUCOSE 173 (H) 06/17/2019  0345   BUN 22 06/17/2019 0345   CREATININE 4.33 (H) 06/17/2019 0345   CALCIUM 9.1 06/17/2019 0345   GFRNONAA 9 (L) 06/17/2019 0345   GFRAA 11 (L) 06/17/2019 0345      Discharge Instructions    Discharge patient   Complete by: As directed    Discharge pt home after dialysis   Discharge disposition: 01-Home or Self Care   Discharge patient date: 06/17/2019      Discharge Diagnosis:  ESRD (end stage renal disease) ([place]) [N18.6]  Secondary Diagnosis: Patient Active Problem List   Diagnosis Date Noted  . ESRD (end stage renal disease) ([place]) 06/16/2019  . ESRD on dialysis ([place]) 04/10/2019  . Acute on chronic combined systolic and diastolic CHF (congestive heart failure) ([place]) 08/26/2018  . Pulmonary edema 08/25/2018  . DOE (dyspnea on exertion) 08/25/2018  . Chest pain 11/21/2016  . Chronic kidney disease (CKD), active medical management without dialysis, stage 5 ([place])   . Cellulitis 05/30/2015  . Cellulitis, abdominal wall 05/30/2015  . UTI (lower urinary tract infection) 05/30/2015  . Diabetes mellitus with renal manifestation ([place]) 05/30/2015  . Cancer of right renal pelvis ([place])   . CKD (chronic kidney disease), stage III ([place])   . Hypertensive heart disease   .  Obesity (BMI 30-39.9)   . Renal mass 02/20/2015  . Atrial flutter ([place]) 12/31/2014  . Persistent atrial fibrillation ([place])   . Atrial fibrillation ([place]) 05/13/2014  . Influenza with respiratory manifestations 04/18/2014  . A-fib ([place]) 04/16/2014  . Arterial hypotension   . Pyrexia   . Renal insufficiency   . Acute on chronic diastolic ACC/AHA stage C congestive heart failure ([place])   . Chronic diastolic CHF (congestive heart failure) ([place]) 09/22/2013  . Acute right-sided CHF (congestive heart failure) ([place]) 08/02/2013  . Bradycardia 08/02/2013  . PAF (paroxysmal atrial fibrillation) ([place]) 01/11/2013  . CAD (coronary artery disease) 01/11/2013  . Elevated troponin 01/11/2013  . Hyperlipidemia 01/11/2013  .  HTN (hypertension)   . Syncope 01/10/2013  . DM neuropathy, type II diabetes mellitus ([place]) 01/09/2013  . NSTEMI (non-ST elevated myocardial infarction) ([place]) 01/08/2013  . Obesity, Class III, BMI 40-49.9 (morbid obesity) ([place]) 03/05/2010  . CAROTID STENOSIS 01/29/2010  . UNSPECIFIED TACHYCARDIA 01/29/2010  . PALPITATIONS 01/29/2010  . Hypothyroidism 02/07/2009  . Anxiety state 02/07/2009  . Hypotension 02/07/2009  . Asthma 02/07/2009  . GASTROESOPHAGEAL REFLUX DISEASE, CHRONIC 02/07/2009  . Osteoarthrosis, unspecified whether generalized or localized, unspecified site 02/07/2009  . DYSPHAGIA UNSPECIFIED 02/07/2009  . DIVERTICULITIS, HX OF 02/07/2009   Past Medical History:  Diagnosis Date  . Adenomatous colon polyp 02/13/09  . Anemia   . Anxiety   . Asthma   . Atrial flutter ([place])    a. By ILR interrogation.  . Bell's palsy 2013  . Cancer of right renal pelvis ([place])    a. 01/2015 s/p robot assisted lap nephroureterectomy, lysis of adhesions.  . Chronic diastolic CHF (congestive heart failure) ([place])    a. 12/2012 Echo: EF 45%, grade 3 DD; b. 08/2014 TEE: EF 55%.  . CKD (chronic kidney disease), stage III   . Complication of anesthesia    difficult to awaken , N/V  . Degenerative disc disease, cervical   . Dementia ([place])   . Depression   . Diabetes mellitus without complication ([place])    Type II  . Diverticulitis   . Dyspnea    shortness of breath, wears oxygen 2- 2.5 liters  . Dysrhythmia    PAF  . End stage renal disease [facility])    T/Th/ [address]  . Family history of adverse reaction to anesthesia    Father - N/V  . Gastroparesis   . GERD (gastroesophageal reflux disease)   . Gout   . Headache   . Hematoma 07/2015   post Nephrectomy  . Hiatal hernia   . History of blood transfusion   . History of kidney stones    passed  . HOH (hard of hearing)   . Hyperlipidemia   . Hypertension   . Hypothyroidism   . IBS (irritable bowel syndrome)   . Myocardial  infarction ([place])    2014  . Neuropathy of both feet   . NICM (nonischemic cardiomyopathy) ([place])    a. 12/2012 Echo: EF 45% with grade 3 DD;  b. 08/2014 TEE: EF 55%, no rwma, mod RAE, mod-sev LAE, triv MR/TR, No LAA thrombus, no PFO/ASD, Grade III plaque in desc thoracic Ao.  . Non-obstructive CAD    a. 12/2012 Cath: LM nl, LAD 50p/m, LCX 50-52m (FFR 0.93), RCA min irregs, EF 55-65%-->Med Rx. b. L&RHC 03/21/2014 EF 50-55%, 50% eccentric LCx stenosis with negative FFR, 40% ostial RCA stenosis, 40-50% mid LAD stenosis   . Obesity (BMI 30-39.9)   . Osteoarthritis   .  Oxygen dependent    a. patient uses 1l at rest and 2L with exertion   . PAF (paroxysmal atrial fibrillation) ([place])    a. 2015 - was on tikosyn but developed QT prolongation and torsades in setting of azithromycin-->tikosyn d/c'd, later switched to [facility] 08/2014;  b. 08/2014 s/p AF RFCA;  c. 11/2014 Amio reduced to 100mg  QD;  d. CHA2DS2VASc = 6-->chronic xarelto, reduced to 15mg  QD 02/2014 in setting of CKD/nephrectomy.  Marland Kitchen PONV (postoperative nausea and vomiting)   . Sleep apnea    pt scored 5 per stop bang tool per PAT visit 02/14/2015; results sent to PCP Dr Melina Copa   . Status post dilation of esophageal narrowing   . Syncope    a. 12/2012: MDT Reveal LINQ ILR placed;  b. 12/2012 Echo: EF 45-50%, Gr 3 DD, mild MR, mildly dil LA;  c. 12/2012 Carotid U/S: 1-39% bilat ICA stenosis.  . Vitamin D deficiency   . Wears glasses      Allergies as of 06/17/2019      Reactions   Adhesive [tape] Itching, Swelling, Rash, Other (See Comments)   Tears skin and causes blisters also. EKG pads will cause welps.    Avelox [moxifloxacin] Swelling, Rash   Blueberry Flavor Anaphylaxis   Cefprozil Shortness Of Breath, Rash   Cetacaine [butamben-tetracaine-benzocaine] Nausea And Vomiting, Swelling   Dicyclomine Nausea And Vomiting, Other (See Comments)   "Heart trouble"; Headaches and increased blood sugars "Heart trouble"; Headaches and increased blood  sugars   Food Anaphylaxis, Other (See Comments)   Melons, Bananas, Cantaloupes, Watermelon-throat closes up and blisters    Imdur [isosorbide Nitrate] Hives, Palpitations, Other (See Comments), Rash   Headaches also   Januvia [sitagliptin] Shortness Of Breath   Lipitor [atorvastatin] Shortness Of Breath   Losartan Potassium Shortness Of Breath   Nitroglycerin Other (See Comments)   Caused cardiac arrest and feels like skin bring torn off back of head Caused cardiac arrest and feels like skin bring torn off back of head   Oxycodone Hives, Rash   Tolerates Dilaudid Tolerates Dilaudid   Penicillins Anaphylaxis   Has patient had a PCN reaction causing immediate rash, facial/tongue/throat swelling, SOB or lightheadedness with hypotension: Yes Has patient had a PCN reaction causing severe rash involving mucus membranes or skin necrosis: No Has patient had a PCN reaction that required hospitalization Yes Has patient had a PCN reaction occurring within the last 10 years: No If all of the above answers are "NO", then may proceed with Cephalosporin use. Has patient had a PCN reaction causing immediate rash, facial/tongue/throat swelling, SOB or lightheadedness with hypotension: Yes Has patient had a PCN reaction causing severe rash involving mucus membranes or skin necrosis: No Has patient had a PCN reaction that required hospitalization Yes Has patient had a PCN reaction occurring within the last 10 years: No If all of the above answers are "NO", then may proceed with Cephalosporin use.   Prednisone Anaphylaxis   Vancomycin Anaphylaxis   Hydrocodone Hives   Tolerates Dilaudid   Latex Other (See Comments), Rash   blisters   Tamiflu [oseltamivir] Other (See Comments)   Contraindicated with other medications Patient on tikosyn, and tamiflu interfered with anti arrhythmic med Contraindicated with other medications Patient on tikosyn, and tamiflu interfered with anti arrhythmic med   Feraheme  [ferumoxytol] Other (See Comments)   Sharp pain to lower back and flank   Zyrtec [cetirizine] Other (See Comments)   unspecified   Lasix [furosemide] Hives, Swelling, Rash  Mupirocin Rash      Medication List    TAKE these medications   acetaminophen 500 MG tablet Commonly known as: TYLENOL Take 0.5 tablets (250 mg total) by mouth every 6 (six) hours as needed for mild pain (pain).   amiodarone 200 MG tablet Commonly known as: PACERONE TAKE ONE (1) TABLET EACH DAY What changed: See the new instructions.   apixaban 5 MG Tabs tablet Commonly known as: Eliquis Take 1 tablet (5 mg total) by mouth 2 (two) times daily.   Auryxia 1 GM 210 MG(Fe) tablet Generic drug: ferric citrate Take 210 mg by mouth 3 (three) times daily with meals.   cetirizine 10 MG tablet Commonly known as: ZYRTEC Take 5 mg by mouth 2 (two) times daily.   clonazePAM 1 MG tablet Commonly known as: KLONOPIN Take 0.5-1 mg by mouth 3 (three) times daily as needed for anxiety.   doxycycline 100 MG capsule Commonly known as: VIBRAMYCIN Take 100 mg by mouth 2 (two) times daily.   estradiol 1 MG tablet Commonly known as: ESTRACE Take 1 mg by mouth daily.   famotidine 20 MG tablet Commonly known as: PEPCID Take 40 mg by mouth daily.   glipiZIDE 10 MG 24 hr tablet Commonly known as: GLUCOTROL XL Take 10 mg by mouth See admin instructions. Take Monday, Wednesday, Friday, Sunday   If CBG is  130 or less , do not take it.   insulin glargine 100 UNIT/ML Solostar Pen Commonly known as: LANTUS Inject 5-35 Units into the skin 2 (two) times daily. Before Lunch per sliding scale: Under 100= 20 units, 100-180= 25 units, 180-240= 30 units, Over 240= 35 units  At 10pm per sliding Scale: Under 100= 5 units, 100-200= 10 units, Over 200= 15 units   levothyroxine 150 MCG tablet Commonly known as: SYNTHROID Take 150 mcg by mouth daily before breakfast.   ondansetron 4 MG tablet Commonly known as: Zofran Take 1  tablet (4 mg total) by mouth every 8 (eight) hours as needed for nausea or vomiting.   Renal Vitamin 0.8 MG Tabs Take 0.8 mg by mouth daily.   simvastatin 20 MG tablet Commonly known as: ZOCOR Take 20 mg by mouth daily at 6 PM.   traMADol 50 MG tablet Commonly known as: Ultram Take 1 tablet (50 mg total) by mouth every 8 (eight) hours as needed.   vitamin B-12 500 MCG tablet Commonly known as: CYANOCOBALAMIN Take 500 mcg by mouth daily.            Durable Medical Equipment  (From admission, onward)         Start     Ordered   06/16/19 1404  For home use only DME lightweight manual wheelchair with seat cushion  Once    Comments: Patient suffers from congestive heart failure, end stage renal disease, and osteoarthrosis which impairs their ability to perform daily activities like adls in the home.  A walker will not resolve  issue with performing activities of daily living. A wheelchair will allow patient to safely perform daily activities. Patient is not able to propel themselves in the home using a standard weight wheelchair due to weakness. Patient can self propel in the lightweight wheelchair. Length of need is lifetime. Accessories: elevating leg rests (ELRs), wheel locks, extensions and anti-tippers.   06/16/19 1405           Instructions: [facility] Discharge Instructions AV Fistula or Graft Surgery for Dialysis Access  Please refer  to the following instructions for your post-procedure care. Your surgeon or physician assistant will discuss any changes with you.  Activity  You may drive the day following your surgery, if you are comfortable and no longer taking prescription pain medication. Resume full activity as the soreness in your incision resolves.  Bathing/Showering  You may shower after you go home. Keep your incision dry for 48 hours. Do not soak in a bathtub, hot tub, or swim until the incision heals completely. You may  not shower if you have a hemodialysis catheter.  Incision Care  Clean your incision with mild soap and water after 48 hours. Pat the area dry with a clean towel. You do not need a bandage unless otherwise instructed. Do not apply any ointments or creams to your incision. You may have skin glue on your incision. Do not peel it off. It will come off on its own in about one week. Your arm may swell a bit after surgery. To reduce swelling use pillows to elevate your arm so it is above your heart. Your doctor will tell you if you need to lightly wrap your arm with an ACE bandage.  Keep Pravena vac on incision until it loses it seal.  Then, wash the groin wound with soap and water daily and pat dry. (No tub bath-only shower)  Then put a dry gauze or washcloth there to keep this area dry daily and as needed.  Do not use Vaseline or neosporin on your incisions.  Only use soap and water on your incisions and then protect and keep dry.  The only time the incision should be wet is when you are in the shower.  This is to help prevent infection.  Diet  Resume your normal diet. There are not special food restrictions following this procedure. In order to heal from your surgery, it is CRITICAL to get adequate nutrition. Your body requires vitamins, minerals, and protein. Vegetables are the best source of vitamins and minerals. Vegetables also provide the perfect balance of protein. Processed food has little nutritional value, so try to avoid this.  Medications  Resume taking all of your medications. If your incision is causing pain, you may take over-the counter pain relievers such as acetaminophen (Tylenol). If you were prescribed a stronger pain medication, please be aware these medications can cause nausea and constipation. Prevent nausea by taking the medication with a snack or meal. Avoid constipation by drinking plenty of fluids and eating foods with high amount of fiber, such as fruits, vegetables, and grains.  Do not take Tylenol if you are taking prescription pain medications.  Follow up Your surgeon may want to see you in the office following your access surgery. If so, this will be arranged at the time of your surgery.  Please call us immediately for any of the following conditions:  . Increased pain, redness, drainage (pus) from your incision site . Fever of 101 degrees or higher . Severe or worsening pain at your incision site . Hand pain or numbness. .  Reduce your risk of vascular disease:  . Stop smoking. If you would like help, call [facility] at 1-800-QUIT-NOW 805-363-7457) or [place] at [phone]  . Manage your cholesterol . Maintain a desired weight . Control your diabetes . Keep your blood pressure down  Dialysis  It will take several weeks to several months for your new dialysis access to be ready for use. Your surgeon will determine when it is OK to use it. Your  nephrologist will continue to direct your dialysis. You can continue to use your Permcath until your new access is ready for use.   06/17/2019 [name] [id number] 10-24-1940  Surgeon(s): Serafina Mitchell, MD  Procedure(s): INSERTION OF ARTERIOVENOUS (AV) GORE-TEX GRAFT LEFT THIGH  x Do not stick graft for 4 weeks    If you have any questions, please call the office at [phone].  Prescriptions given: Tramadol #20 No Refill  Disposition: home  Patient's condition: is Good  Follow up: 1. Dr. Trula Slade or PA on Dr. Stephens Shire clinic day in 2 weeks   Leontine Locket, PA-C [facility] [phone] 06/17/2019  9:38 AM

## 2019-06-16 NOTE — Op Note (Signed)
    Patient name: INFANTOF VILLAGOMEZ MRN: [id number] DOB: 1941-01-19 Sex: female  06/16/2019 Pre-operative Diagnosis: End-stage renal disease Post-operative diagnosis:  Same Surgeon:  [name] Assistants: Aldona Bar Ryne Procedure: #1: Left thigh AVG G (4 x 7)   #2: Placement of Prevena wound VAC Anesthesia: General Blood Loss: Minimal Specimens: None  Findings: Venous anastomosis was to the saphenous vein approximately 2 cm distal to the saphenofemoral junction.  This was a end to end anastomosis.  The arterial anastomosis was to the superficial femoral artery and a end-to-side fashion.  Indications: The patient has previously had a left arm fistula ligated for steal.  She refused a access in her right arm given the steal symptoms in her left.  She is here today for thigh graft.  Procedure:  The patient was identified in the holding area and taken to [place] [facility]  The patient was then placed supine on the table. general anesthesia was administered.  The patient was prepped and draped in the usual sterile fashion.  A time out was called and antibiotics were administered.  I made an oblique incision in the left groin so as to avoid a skin crease from her pannus.  Cautery was used about subcutaneous tissue down to the femoral sheath which was opened sharply.  I dissected out the saphenous vein which was a healthy appearing 6 mm vein.  I then dissected out the artery.  The dissection was at the level of the superficial femoral artery which was fully mobilized.  A counterincision was then made in the distal thigh and a curved tunneler was used to create a subcutaneous tunnel.  A 6 x 50 stretch Gore-Tex graft was brought through the tunnel.  The patient was fully heparinized.  After the heparin circulated, I ligated the saphenous vein distally.  It was clamped proximally.  A end-to-end anastomosis was created with running 6-0 Prolene.  The graft was pulled the appropriate length.  I then occluded the  superficial femoral artery with vascular clamps and made a arteriotomy with 11 blade which was extended longitudinally with Potts scissors.  The graft was cut to the appropriate length and beveled but the size the arteriotomy.  A running anastomosis was created with 6-0 Prolene in a end-to-side fashion.  Prior to completion the appropriate flushing maneuvers were performed and the anastomosis was completed.  Clamps were released.  There was an excellent thrill within the graft.  50 mg of protamine was used to reverse the heparin.  Arista was placed in the wound.  Once hemostasis was satisfactory the deep tissue was reapproximated with 2-0 Vicryl.  The subcutaneous tissue was closed with another layer of 3-0 Vicryl followed by 3-0 Vicryl on the skin.  The counterincision was closed by 3-0 Vicryl.  Dermabond was placed on the counterincision and a Prevena wound VAC was placed over the groin incision.  The patient was successfully extubated taken recovery in stable condition.  There were no immediate complications.   Disposition: To PACU stable.   Theotis Burrow, M.D., [facility] [place] Office: [phone] Pager:  [phone]

## 2019-06-16 NOTE — Discharge Instructions (Signed)
[facility]  Discharge Instructions  AV Fistula or Graft Surgery for Dialysis Access  Please refer to the following instructions for your post-procedure care. Your surgeon or physician assistant will discuss any changes with you.  Activity  You may drive the day following your surgery, if you are comfortable and no longer taking prescription pain medication. Resume full activity as the soreness in your incision resolves.  Bathing/Showering  You may shower after you go home. Keep your incision dry for 48 hours. Do not soak in a bathtub, hot tub, or swim until the incision heals completely. You may not shower if you have a hemodialysis catheter.  Incision Care  Clean your incision with mild soap and water after 48 hours. Pat the area dry with a clean towel. You do not need a bandage unless otherwise instructed. Do not apply any ointments or creams to your incision. You may have skin glue on your incision. Do not peel it off. It will come off on its own in about one week. Your arm may swell a bit after surgery. To reduce swelling use pillows to elevate your arm so it is above your heart. Your doctor will tell you if you need to lightly wrap your arm with an ACE bandage.  Keep Pravena vac on incision until it loses it seal.  Then, wash the groin wound with soap and water daily and pat dry. (No tub bath-only shower)  Then put a dry gauze or washcloth there to keep this area dry daily and as needed.  Do not use Vaseline or neosporin on your incisions.  Only use soap and water on your incisions and then protect and keep dry.  The only time the incision should be wet is when you are in the shower.  This is to help prevent infection.   Diet  Resume your normal diet. There are not special food restrictions following this procedure. In order to heal from your surgery, it is CRITICAL to get adequate nutrition. Your body requires vitamins, minerals, and protein.  Vegetables are the best source of vitamins and minerals. Vegetables also provide the perfect balance of protein. Processed food has little nutritional value, so try to avoid this.  Medications  Resume taking all of your medications. If your incision is causing pain, you may take over-the counter pain relievers such as acetaminophen (Tylenol). If you were prescribed a stronger pain medication, please be aware these medications can cause nausea and constipation. Prevent nausea by taking the medication with a snack or meal. Avoid constipation by drinking plenty of fluids and eating foods with high amount of fiber, such as fruits, vegetables, and grains.  Do not take Tylenol if you are taking prescription pain medications.  Follow up Your surgeon may want to see you in the office following your access surgery. If so, this will be arranged at the time of your surgery.  Please call us immediately for any of the following conditions:  . Increased pain, redness, drainage (pus) from your incision site . Fever of 101 degrees or higher . Severe or worsening pain at your incision site . Hand pain or numbness. .  Reduce your risk of vascular disease:  . Stop smoking. If you would like help, call [facility] at 1-800-QUIT-NOW 825 651 9378) or [place] at 705-415-9450  . Manage your cholesterol . Maintain a desired weight . Control your diabetes . Keep your blood pressure down  Dialysis  It will take several weeks to several  months for your new dialysis access to be ready for use. Your surgeon will determine when it is okay to use it. Your nephrologist will continue to direct your dialysis. You can continue to use your Permcath until your new access is ready for use.   06/16/2019 [name] [id number] September 18, 1940  Surgeon(s): Serafina Mitchell, MD  Procedure(s): INSERTION OF ARTERIOVENOUS (AV) GORE-TEX GRAFT LEFT THIGH  x Do not stick graft for 4 weeks    If you have any  questions, please call the office at [phone].

## 2019-06-16 NOTE — Progress Notes (Signed)
    Durable Medical Equipment  (From admission, onward)         Start     Ordered   06/16/19 1404  For home use only DME lightweight manual wheelchair with seat cushion  Once    Comments: Patient suffers from congestive heart failure, end stage renal disease, and osteoarthrosis which impairs their ability to perform daily activities like adls in the home.  A walker will not resolve  issue with performing activities of daily living. A wheelchair will allow patient to safely perform daily activities. Patient is not able to propel themselves in the home using a standard weight wheelchair due to weakness. Patient can self propel in the lightweight wheelchair. Length of need is lifetime. Accessories: elevating leg rests (ELRs), wheel locks, extensions and anti-tippers.   06/16/19 1405

## 2019-06-17 DIAGNOSIS — I132 Hypertensive heart and chronic kidney disease with heart failure and with stage 5 chronic kidney disease, or end stage renal disease: Secondary | ICD-10-CM | POA: Diagnosis not present

## 2019-06-17 LAB — CBC
HCT: 36 % (ref 36.0–46.0)
Hemoglobin: 11.3 g/dL — ABNORMAL LOW (ref 12.0–15.0)
MCH: 32.7 pg (ref 26.0–34.0)
MCHC: 31.4 g/dL (ref 30.0–36.0)
MCV: 104 fL — ABNORMAL HIGH (ref 80.0–100.0)
Platelets: 160 10*3/uL (ref 150–400)
RBC: 3.46 MIL/uL — ABNORMAL LOW (ref 3.87–5.11)
RDW: 14.9 % (ref 11.5–15.5)
WBC: 8 10*3/uL (ref 4.0–10.5)
nRBC: 0 % (ref 0.0–0.2)

## 2019-06-17 LAB — GLUCOSE, CAPILLARY
Glucose-Capillary: 153 mg/dL — ABNORMAL HIGH (ref 70–99)
Glucose-Capillary: 176 mg/dL — ABNORMAL HIGH (ref 70–99)
Glucose-Capillary: 132 mg/dL — ABNORMAL HIGH (ref 70–99)

## 2019-06-17 LAB — BASIC METABOLIC PANEL
Anion gap: 12 (ref 5–15)
BUN: 22 mg/dL (ref 8–23)
CO2: 25 mmol/L (ref 22–32)
Calcium: 9.1 mg/dL (ref 8.9–10.3)
Chloride: 101 mmol/L (ref 98–111)
Creatinine, Ser: 4.33 mg/dL — ABNORMAL HIGH (ref 0.44–1.00)
GFR calc Af Amer: 11 mL/min — ABNORMAL LOW (ref >60)
GFR calc non Af Amer: 9 mL/min — ABNORMAL LOW (ref >60)
Glucose, Bld: 173 mg/dL — ABNORMAL HIGH (ref 70–99)
Potassium: 4.4 mmol/L (ref 3.5–5.1)
Sodium: 138 mmol/L (ref 135–145)

## 2019-06-17 MED ORDER — TRAMADOL HCL 50 MG PO TABS: 50.0000 mg | ORAL_TABLET | Freq: Three times a day (TID) | ORAL | 0 refills | Status: DC | PRN

## 2019-06-17 MED ORDER — CHLORHEXIDINE GLUCONATE CLOTH 2 % EX PADS
6.0000 | MEDICATED_PAD | Freq: Every day | CUTANEOUS | Status: DC
  Administered 2019-06-17: 6 via TOPICAL

## 2019-06-17 MED ORDER — HEPARIN SODIUM (PORCINE) 1000 UNIT/ML IJ SOLN
INTRAMUSCULAR | Status: AC
  Filled 2019-06-17: qty 4

## 2019-06-17 MED ORDER — APIXABAN 5 MG PO TABS
5.0000 mg | ORAL_TABLET | Freq: Two times a day (BID) | ORAL | Status: DC
  Administered 2019-06-17: 5 mg via ORAL
  Filled 2019-06-17: qty 1

## 2019-06-17 NOTE — Procedures (Signed)
   I was present at this dialysis session, have reviewed the session itself and made  appropriate changes Kelly Splinter MD [place] pager [phone]   06/17/2019, 5:25 PM

## 2019-06-17 NOTE — Progress Notes (Addendum)
[facility] Brief Note  Patient admited observation status. She underwent placement of left thigh AVGG yesterday per Dr. Oliva Bustard. Seen at bedside. She is due for routine dialysis today and does not feel up to going to her outpatient center today. Orders written for short dialysis in hospital today. Should be ok for discharge after.    Lynnda Child PA-C [facility] 06/17/2019,9:19 AM  Pt seen, examined and agree w A/P as above.  Kelly Splinter  MD 06/17/2019, 5:25 PM

## 2019-06-17 NOTE — Plan of Care (Signed)
  Problem: Education: Goal: Knowledge of General Education information will improve Description: Including pain rating scale, medication(s)/side effects and non-pharmacologic comfort measures 06/17/2019 1353 by Narcisa Ganesh, Godfrey Pick, RN Outcome: Adequate for Discharge 06/17/2019 1353 by Shianna Bally, Godfrey Pick, RN Outcome: Adequate for Discharge   Problem: Education: Goal: Knowledge of disease and its progression will improve 06/17/2019 1353 by Thayden Lemire, Godfrey Pick, RN Outcome: Adequate for Discharge 06/17/2019 1353 by Aashna Matson, Godfrey Pick, RN Outcome: Adequate for Discharge Goal: Individualized Educational Video(s) 06/17/2019 1353 by Delbert Darley, Godfrey Pick, RN Outcome: Adequate for Discharge 06/17/2019 1353 by Kuron Docken, Godfrey Pick, RN Outcome: Adequate for Discharge   Problem: Fluid Volume: Goal: Compliance with measures to maintain balanced fluid volume will improve 06/17/2019 1353 by Raymonda Pell, Godfrey Pick, RN Outcome: Adequate for Discharge 06/17/2019 1353 by Kennah Hehr, Godfrey Pick, RN Outcome: Adequate for Discharge   Problem: Health Behavior/Discharge Planning: Goal: Ability to manage health-related needs will improve 06/17/2019 1353 by Dominiqua Cooner, Godfrey Pick, RN Outcome: Adequate for Discharge 06/17/2019 1353 by Torian Thoennes, Godfrey Pick, RN Outcome: Adequate for Discharge   Problem: Nutritional: Goal: Ability to make healthy dietary choices will improve 06/17/2019 1353 by Traylon Schimming, Godfrey Pick, RN Outcome: Adequate for Discharge 06/17/2019 1353 by Yvonne Petite, Godfrey Pick, RN Outcome: Adequate for Discharge   Problem: Clinical Measurements: Goal: Complications related to the disease process, condition or treatment will be avoided or minimized 06/17/2019 1353 by Christerpher Clos, Godfrey Pick, RN Outcome: Adequate for Discharge 06/17/2019 1353 by Redonna Wilbert, Godfrey Pick, RN Outcome: Adequate for Discharge  Patient will be discharged after HD

## 2019-06-17 NOTE — Progress Notes (Signed)
Nsg Discharge Note  Admit Date:  06/16/2019 Discharge date: 06/17/2019   [name] to be D/C'd  per MD order.  AVS completed.  Patient/caregiver able to verbalize understanding.  Discharge Medication: Allergies as of 06/17/2019      Reactions   Adhesive [tape] Itching, Swelling, Rash, Other (See Comments)   Tears skin and causes blisters also. EKG pads will cause welps.    Avelox [moxifloxacin] Swelling, Rash   Blueberry Flavor Anaphylaxis   Cefprozil Shortness Of Breath, Rash   Cetacaine [butamben-tetracaine-benzocaine] Nausea And Vomiting, Swelling   Dicyclomine Nausea And Vomiting, Other (See Comments)   "Heart trouble"; Headaches and increased blood sugars "Heart trouble"; Headaches and increased blood sugars   Food Anaphylaxis, Other (See Comments)   Melons, Bananas, Cantaloupes, Watermelon-throat closes up and blisters    Imdur [isosorbide Nitrate] Hives, Palpitations, Other (See Comments), Rash   Headaches also   Januvia [sitagliptin] Shortness Of Breath   Lipitor [atorvastatin] Shortness Of Breath   Losartan Potassium Shortness Of Breath   Nitroglycerin Other (See Comments)   Caused cardiac arrest and feels like skin bring torn off back of head Caused cardiac arrest and feels like skin bring torn off back of head   Oxycodone Hives, Rash   Tolerates Dilaudid Tolerates Dilaudid   Penicillins Anaphylaxis   Has patient had a PCN reaction causing immediate rash, facial/tongue/throat swelling, SOB or lightheadedness with hypotension: Yes Has patient had a PCN reaction causing severe rash involving mucus membranes or skin necrosis: No Has patient had a PCN reaction that required hospitalization Yes Has patient had a PCN reaction occurring within the last 10 years: No If all of the above answers are "NO", then may proceed with Cephalosporin use. Has patient had a PCN reaction causing immediate rash, facial/tongue/throat swelling, SOB or lightheadedness with hypotension:  Yes Has patient had a PCN reaction causing severe rash involving mucus membranes or skin necrosis: No Has patient had a PCN reaction that required hospitalization Yes Has patient had a PCN reaction occurring within the last 10 years: No If all of the above answers are "NO", then may proceed with Cephalosporin use.   Prednisone Anaphylaxis   Vancomycin Anaphylaxis   Hydrocodone Hives   Tolerates Dilaudid   Latex Other (See Comments), Rash   blisters   Tamiflu [oseltamivir] Other (See Comments)   Contraindicated with other medications Patient on tikosyn, and tamiflu interfered with anti arrhythmic med Contraindicated with other medications Patient on tikosyn, and tamiflu interfered with anti arrhythmic med   Feraheme [ferumoxytol] Other (See Comments)   Sharp pain to lower back and flank   Zyrtec [cetirizine] Other (See Comments)   unspecified   Lasix [furosemide] Hives, Swelling, Rash   Mupirocin Rash      Medication List    TAKE these medications   acetaminophen 500 MG tablet Commonly known as: TYLENOL Take 0.5 tablets (250 mg total) by mouth every 6 (six) hours as needed for mild pain (pain).   amiodarone 200 MG tablet Commonly known as: PACERONE TAKE ONE (1) TABLET EACH DAY What changed: See the new instructions.   apixaban 5 MG Tabs tablet Commonly known as: Eliquis Take 1 tablet (5 mg total) by mouth 2 (two) times daily.   Auryxia 1 GM 210 MG(Fe) tablet Generic drug: ferric citrate Take 210 mg by mouth 3 (three) times daily with meals.   cetirizine 10 MG tablet Commonly known as: ZYRTEC Take 5 mg by mouth 2 (two) times daily.   clonazePAM 1 MG tablet  Commonly known as: KLONOPIN Take 0.5-1 mg by mouth 3 (three) times daily as needed for anxiety.   doxycycline 100 MG capsule Commonly known as: VIBRAMYCIN Take 100 mg by mouth 2 (two) times daily.   estradiol 1 MG tablet Commonly known as: ESTRACE Take 1 mg by mouth daily.   famotidine 20 MG  tablet Commonly known as: PEPCID Take 40 mg by mouth daily.   glipiZIDE 10 MG 24 hr tablet Commonly known as: GLUCOTROL XL Take 10 mg by mouth See admin instructions. Take Monday, Wednesday, Friday, Sunday   If CBG is  130 or less , do not take it.   insulin glargine 100 UNIT/ML Solostar Pen Commonly known as: LANTUS Inject 5-35 Units into the skin 2 (two) times daily. Before Lunch per sliding scale: Under 100= 20 units, 100-180= 25 units, 180-240= 30 units, Over 240= 35 units  At 10pm per sliding Scale: Under 100= 5 units, 100-200= 10 units, Over 200= 15 units   levothyroxine 150 MCG tablet Commonly known as: SYNTHROID Take 150 mcg by mouth daily before breakfast.   ondansetron 4 MG tablet Commonly known as: Zofran Take 1 tablet (4 mg total) by mouth every 8 (eight) hours as needed for nausea or vomiting.   Renal Vitamin 0.8 MG Tabs Take 0.8 mg by mouth daily.   simvastatin 20 MG tablet Commonly known as: ZOCOR Take 20 mg by mouth daily at 6 PM.   traMADol 50 MG tablet Commonly known as: Ultram Take 1 tablet (50 mg total) by mouth every 8 (eight) hours as needed.   vitamin B-12 500 MCG tablet Commonly known as: CYANOCOBALAMIN Take 500 mcg by mouth daily.            Durable Medical Equipment  (From admission, onward)         Start     Ordered   06/16/19 1404  For home use only DME lightweight manual wheelchair with seat cushion  Once    Comments: Patient suffers from congestive heart failure, end stage renal disease, and osteoarthrosis which impairs their ability to perform daily activities like adls in the home.  A walker will not resolve  issue with performing activities of daily living. A wheelchair will allow patient to safely perform daily activities. Patient is not able to propel themselves in the home using a standard weight wheelchair due to weakness. Patient can self propel in the lightweight wheelchair. Length of need is lifetime. Accessories: elevating  leg rests (ELRs), wheel locks, extensions and anti-tippers.   06/16/19 1405          Discharge Assessment: Vitals:   06/17/19 1719 06/17/19 1812  BP: (!) 97/51 104/66  Pulse: 84 (!) 108  Resp: (!) 21 18  Temp: 97.7 F (36.5 C) 97.7 F (36.5 C)  SpO2: 100% 99%   Skin clean, dry and intact without evidence of skin break down, no evidence of skin tears noted. IV catheter discontinued intact. Site without signs and symptoms of complications - no redness or edema noted at insertion site, patient denies c/o pain - only slight tenderness at site.  Dressing with slight pressure applied.  D/c Instructions-Education: Discharge instructions given to patient/family with verbalized understanding. D/c education completed with patient/family including follow up instructions, medication list, d/c activities limitations if indicated, with other d/c instructions as indicated by MD - patient able to verbalize understanding, all questions fully answered. Patient instructed to return to ED, call 911, or call MD for any changes in condition. Home provena  in good working condition. Patient escorted via [place], and D/C home via private auto.  Honor Loh , RN 06/17/2019 6:18 PM

## 2019-06-17 NOTE — Progress Notes (Signed)
  Progress Note    06/17/2019 10:20 AM 1 Day Post-Op  Subjective: Having some left thigh pain  Vitals:   06/17/19 0456 06/17/19 0841  BP: (!) 91/58 (!) 95/57  Pulse: 85 86  Resp: 18 18  Temp: 98.4 F (36.9 C) 97.7 F (36.5 C)  SpO2: 100% 100%    Physical Exam: Awake alert oriented Left thigh graft with palpable pulsatility Counterincision healing well clean dry intact and groin incision with Prevena VAC in place She has minimal function of her left thumb index and middle finger does have grip of her ring and small finger Palpable left radial pulse Left foot is warm and well-perfused  CBC    Component Value Date/Time   WBC 8.0 06/17/2019 0345   RBC 3.46 (L) 06/17/2019 0345   HGB 11.3 (L) 06/17/2019 0345   HCT 36.0 06/17/2019 0345   PLT 160 06/17/2019 0345   MCV 104.0 (H) 06/17/2019 0345   MCH 32.7 06/17/2019 0345   MCHC 31.4 06/17/2019 0345   RDW 14.9 06/17/2019 0345   LYMPHSABS 2.1 08/25/2018 1714   MONOABS 0.8 08/25/2018 1714   EOSABS 0.0 08/25/2018 1714   BASOSABS 0.1 08/25/2018 1714    BMET    Component Value Date/Time   NA 138 06/17/2019 0345   K 4.4 06/17/2019 0345   CL 101 06/17/2019 0345   CO2 25 06/17/2019 0345   GLUCOSE 173 (H) 06/17/2019 0345   BUN 22 06/17/2019 0345   CREATININE 4.33 (H) 06/17/2019 0345   CALCIUM 9.1 06/17/2019 0345   GFRNONAA 9 (L) 06/17/2019 0345   GFRAA 11 (L) 06/17/2019 0345    INR    Component Value Date/Time   INR 1.1 05/19/2019 0759     Intake/Output Summary (Last 24 hours) at 06/17/2019 1020 Last data filed at 06/17/2019 1000 Gross per 24 hour  Intake 572.52 ml  Output 1 ml  Net 571.52 ml     Assessment/plan:  79 y.o. female is s/p placement of left thigh AV graft.  She will get a short course of dialysis today and plan for discharge home to follow-up in our office in a few weeks for evaluation.    Almir Botts C. Donzetta Matters, MD [facility] [place] Office: [phone] Pager:  [phone]  06/17/2019 10:20 AM

## 2019-06-17 NOTE — Progress Notes (Signed)
Patient in HD.

## 2019-06-17 NOTE — Plan of Care (Signed)
  Problem: Education: Goal: Knowledge of General Education information will improve Description: Including pain rating scale, medication(s)/side effects and non-pharmacologic comfort measures 06/17/2019 1355 by Dekota Kirlin, Godfrey Pick, RN Outcome: Completed/Met 06/17/2019 1353 by Jerae Izard, Godfrey Pick, RN Outcome: Adequate for Discharge 06/17/2019 1353 by Morty Ortwein, Godfrey Pick, RN Outcome: Adequate for Discharge   Problem: Education: Goal: Knowledge of disease and its progression will improve 06/17/2019 1355 by Verginia Toohey, Godfrey Pick, RN Outcome: Completed/Met 06/17/2019 1353 by Shady Padron, Godfrey Pick, RN Outcome: Adequate for Discharge 06/17/2019 1353 by Agamjot Kilgallon, Godfrey Pick, RN Outcome: Adequate for Discharge Goal: Individualized Educational Video(s) 06/17/2019 1355 by Abid Bolla, Godfrey Pick, RN Outcome: Completed/Met 06/17/2019 1353 by Jenah Vanasten, Godfrey Pick, RN Outcome: Adequate for Discharge 06/17/2019 1353 by Wandell Scullion, Godfrey Pick, RN Outcome: Adequate for Discharge   Problem: Fluid Volume: Goal: Compliance with measures to maintain balanced fluid volume will improve 06/17/2019 1355 by Treyvon Blahut, Godfrey Pick, RN Outcome: Completed/Met 06/17/2019 1353 by Montell Leopard, Godfrey Pick, RN Outcome: Adequate for Discharge 06/17/2019 1353 by Filip Luten, Godfrey Pick, RN Outcome: Adequate for Discharge   Problem: Health Behavior/Discharge Planning: Goal: Ability to manage health-related needs will improve 06/17/2019 1355 by Romin Divita, Godfrey Pick, RN Outcome: Completed/Met 06/17/2019 1353 by Brenson Hartman, Godfrey Pick, RN Outcome: Adequate for Discharge 06/17/2019 1353 by Janard Culp, Godfrey Pick, RN Outcome: Adequate for Discharge   Problem: Nutritional: Goal: Ability to make healthy dietary choices will improve 06/17/2019 1355 by Makyna Niehoff, Godfrey Pick, RN Outcome: Completed/Met 06/17/2019 1353 by Azazel Franze, Godfrey Pick, RN Outcome: Adequate for Discharge 06/17/2019 1353 by Chinedum Vanhouten, Godfrey Pick, RN Outcome: Adequate for Discharge   Problem: Clinical Measurements: Goal:  Complications related to the disease process, condition or treatment will be avoided or minimized 06/17/2019 1355 by Tyiesha Brackney, Godfrey Pick, RN Outcome: Completed/Met 06/17/2019 1353 by Antwaun Buth, Godfrey Pick, RN Outcome: Adequate for Discharge 06/17/2019 1353 by Story Vanvranken, Godfrey Pick, RN Outcome: Adequate for Discharge

## 2019-06-17 NOTE — Progress Notes (Signed)
Provena for home use connected and working. Patient ducated.

## 2019-06-17 NOTE — Progress Notes (Signed)
Patient returned from HD at approximately 1545.

## 2019-06-17 NOTE — Plan of Care (Signed)
  Problem: Education: Goal: Knowledge of General Education information will improve Description: Including pain rating scale, medication(s)/side effects and non-pharmacologic comfort measures Outcome: Adequate for Discharge   Problem: Education: Goal: Knowledge of disease and its progression will improve Outcome: Adequate for Discharge Goal: Individualized Educational Video(s) Outcome: Adequate for Discharge   Problem: Fluid Volume: Goal: Compliance with measures to maintain balanced fluid volume will improve Outcome: Adequate for Discharge   Problem: Health Behavior/Discharge Planning: Goal: Ability to manage health-related needs will improve Outcome: Adequate for Discharge   Problem: Nutritional: Goal: Ability to make healthy dietary choices will improve Outcome: Adequate for Discharge   Problem: Clinical Measurements: Goal: Complications related to the disease process, condition or treatment will be avoided or minimized Outcome: Adequate for Discharge

## 2019-06-19 ENCOUNTER — Telehealth: Payer: Self-pay | Admitting: Nephrology

## 2019-06-19 ENCOUNTER — Telehealth: Payer: Self-pay

## 2019-06-19 NOTE — Telephone Encounter (Signed)
Transition of care contact from inpatient facility  Date of discharge: 06/17/19 Date of contact: 06/19/19 Method: Phone Spoke to: Patient  Patient contacted to discuss transition of care from recent inpatient hospitalization. Patient was admitted to [facility] from 06/16/19 to 06/17/19 with discharge diagnosis of Insert Left Thigh AVGG with Prevna  Wound vac    She reported some pain at Surgery site and Low grade fever  100 last pm, instructed to call [facility] if pain unrelenting or temp continuous    Medication changes were reviewed.  Patient will follow up with his/her outpatient HD unit [id number] 06/20/19

## 2019-06-19 NOTE — Telephone Encounter (Addendum)
Telephone call received from patient stating can hardly walk from pain in left leg. Reports incision at left knee has area that has opened up with red, bloody drainage. States when she sits, incision area pulls.   Discussed with Corrina B., PA. Advised pt to continue to take her Tramadol as prescribed for pain. Pt advised to clean area gently with soap and water, pat dry, may cover with dry gauze dressing and discussed s/o infection to monitor or wound separation. Pt scheduled for wound check with PA on tomorrow at 9:45AM. Pt voiced understanding. Minette Brine, RN

## 2019-06-20 ENCOUNTER — Ambulatory Visit (INDEPENDENT_AMBULATORY_CARE_PROVIDER_SITE_OTHER): Payer: Self-pay | Admitting: Physician Assistant

## 2019-06-20 ENCOUNTER — Other Ambulatory Visit: Payer: Self-pay

## 2019-06-20 VITALS — BP 114/75 | HR 81 | Temp 97.4°F | Resp 18 | Ht 62.0 in | Wt 231.0 lb

## 2019-06-20 DIAGNOSIS — Z992 Dependence on renal dialysis: Secondary | ICD-10-CM

## 2019-06-20 DIAGNOSIS — N186 End stage renal disease: Secondary | ICD-10-CM

## 2019-06-20 NOTE — Progress Notes (Signed)
POST OPERATIVE OFFICE NOTE    CC:  F/u for surgery  HPI:  This is a 79 y.o. female who is s/p left thigh graft on 06/15/2019 by Dr. Trula Slade.  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  She saw Dr. Trula Slade back on 5/17 and at that time, pt did not want to have a right arm access given steal sx had affected her left arm.  She had a hx of cardiac cath via right groin and therefore, she had a left thigh graft placed on 5/20.  She returns today with c/o pain around the graft and states it started oozing in the past day or two and was afraid the counter incision had opened. She has not had any fevers.  She states she has 4 more abx treatment for a UTI.  She tells me that she does not have pain in her left foot and states that it is fine.  She has some tingling in it when she stands initially but improves shortly thereafter.  She states the Vilinda Boehringer continues to have a seal.  She states that her left hand remains unchanged.   She is here today with her son in law [place].  Allergies  Allergen Reactions  . Adhesive [Tape] Itching, Swelling, Rash and Other (See Comments)    Tears skin and causes blisters also. EKG pads will cause welps.   . Avelox [Moxifloxacin] Swelling and Rash  . Blueberry Flavor Anaphylaxis  . Cefprozil Shortness Of Breath and Rash  . Cetacaine [Butamben-Tetracaine-Benzocaine] Nausea And Vomiting and Swelling  . Dicyclomine Nausea And Vomiting and Other (See Comments)    "Heart trouble"; Headaches and increased blood sugars "Heart trouble"; Headaches and increased blood sugars  . Food Anaphylaxis and Other (See Comments)    Melons, Bananas, Cantaloupes, Watermelon-throat closes up and blisters   . Imdur [Isosorbide Nitrate] Hives, Palpitations, Other (See Comments) and Rash    Headaches also  . Januvia [Sitagliptin] Shortness Of Breath  . Lipitor [Atorvastatin] Shortness Of Breath  . Losartan Potassium  Shortness Of Breath  . Nitroglycerin Other (See Comments)    Caused cardiac arrest and feels like skin bring torn off back of head Caused cardiac arrest and feels like skin bring torn off back of head  . Oxycodone Hives and Rash    Tolerates Dilaudid Tolerates Dilaudid  . Penicillins Anaphylaxis    Has patient had a PCN reaction causing immediate rash, facial/tongue/throat swelling, SOB or lightheadedness with hypotension: Yes Has patient had a PCN reaction causing severe rash involving mucus membranes or skin necrosis: No Has patient had a PCN reaction that required hospitalization Yes Has patient had a PCN reaction occurring within the last 10 years: No If all of the above answers are "NO", then may proceed with Cephalosporin use. Has patient had a PCN reaction causing immediate rash, facial/tongue/throat swelling, SOB or lightheadedness with hypotension: Yes Has patient had a PCN reaction causing severe rash involving mucus membranes or skin necrosis: No Has patient had a PCN reaction that required hospitalization Yes Has patient had a PCN reaction occurring within the last 10 years: No If all of the above answers are "NO", then may proceed with Cephalosporin use.   . Prednisone Anaphylaxis  . Vancomycin Anaphylaxis  . Hydrocodone Hives    Tolerates Dilaudid  . Latex Other (See Comments) and Rash    blisters  . Tamiflu [Oseltamivir] Other (See Comments)    Contraindicated with other  medications Patient on tikosyn, and tamiflu interfered with anti arrhythmic med Contraindicated with other medications Patient on tikosyn, and tamiflu interfered with anti arrhythmic med  . Feraheme [Ferumoxytol] Other (See Comments)    Sharp pain to lower back and flank  . Zyrtec [Cetirizine] Other (See Comments)    unspecified  . Lasix [Furosemide] Hives, Swelling and Rash  . Mupirocin Rash    Current Outpatient Medications  Medication Sig Dispense Refill  . acetaminophen (TYLENOL) 500 MG  tablet Take 0.5 tablets (250 mg total) by mouth every 6 (six) hours as needed for mild pain (pain).    Marland Kitchen amiodarone (PACERONE) 200 MG tablet TAKE ONE (1) TABLET EACH DAY (Patient taking differently: Take 200 mg by mouth daily. ) 90 tablet 1  . apixaban (ELIQUIS) 5 MG TABS tablet Take 1 tablet (5 mg total) by mouth 2 (two) times daily. 60 tablet 6  . B Complex-C-Folic Acid (RENAL VITAMIN) 0.8 MG TABS Take 0.8 mg by mouth daily.    . cetirizine (ZYRTEC) 10 MG tablet Take 5 mg by mouth 2 (two) times daily.     . clonazePAM (KLONOPIN) 1 MG tablet Take 0.5-1 mg by mouth 3 (three) times daily as needed for anxiety.     Marland Kitchen doxycycline (VIBRAMYCIN) 100 MG capsule Take 100 mg by mouth 2 (two) times daily.    Marland Kitchen estradiol (ESTRACE) 1 MG tablet Take 1 mg by mouth daily.    . famotidine (PEPCID) 20 MG tablet Take 40 mg by mouth daily.     . ferric citrate (AURYXIA) 1 GM 210 MG(Fe) tablet Take 210 mg by mouth 3 (three) times daily with meals.     Marland Kitchen glipiZIDE (GLUCOTROL XL) 10 MG 24 hr tablet Take 10 mg by mouth See admin instructions. Take Monday, Wednesday, Friday, Sunday   If CBG is  130 or less , do not take it.    . Insulin Glargine (LANTUS) 100 UNIT/ML Solostar Pen Inject 5-35 Units into the skin 2 (two) times daily. Before Lunch per sliding scale: Under 100= 20 units, 100-180= 25 units, 180-240= 30 units, Over 240= 35 units  At 10pm per sliding Scale: Under 100= 5 units, 100-200= 10 units, Over 200= 15 units    . levothyroxine (SYNTHROID) 150 MCG tablet Take 150 mcg by mouth daily before breakfast.     . ondansetron (ZOFRAN) 4 MG tablet Take 1 tablet (4 mg total) by mouth every 8 (eight) hours as needed for nausea or vomiting. 30 tablet 1  . simvastatin (ZOCOR) 20 MG tablet Take 20 mg by mouth daily at 6 PM.     . traMADol (ULTRAM) 50 MG tablet Take 1 tablet (50 mg total) by mouth every 8 (eight) hours as needed. 20 tablet 0  . vitamin B-12 (CYANOCOBALAMIN) 500 MCG tablet Take 500 mcg by mouth daily.      No current facility-administered medications for this visit.     ROS:  See HPI  Physical Exam:  Today's Vitals   06/20/19 0949  BP: 114/75  Pulse: 81  Resp: 18  Temp: (!) 97.4 F (36.3 C)  TempSrc: Temporal  SpO2: 97%  Weight: 231 lb (104.8 kg)  Height: 5\' 2"  (1.575 m)   Body mass index is 42.25 kg/m.   Incision:  Left thigh counter incision with some redness/bruising around the incision most likely from tunneling.  She does have a superficial area of sloughing just above the incision.  This does not appear infected. There is some mild bloody drainage  on the bandage.  The incision is in tact.   Extremities:  Easily palpable left radial and ulnar pulses.  Fingers on left hand warm.  Still with weak grip of left hand.  There is a bruit present in the left thigh AVG and is palpable.    Assessment/Plan:  This is a 79 y.o. female who is s/p:  left thigh graft on 06/15/2019 by Dr. Trula Slade.  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  She comes back in today with concerns of her counter incision on the left thigh.   -pt seen and examined with Dr. Donnetta Hutching.  Pt does have some redness/bruising around the counter incision.  Dr. Donnetta Hutching felt there is no evidence of infection and possibly reaction to tunneling. There is an area just above the incision that has skin sloughing and there is some bloody ooze from this.  Dr. Donnetta Hutching discussed with her and [name] that this should improve with time.  She will continue dry dressing to this.  Pt does have 4 more abx treatments for UTI.    -she states her Vilinda Boehringer is still with a seal.  She understands when she can remove this vac.  Long discussion about when the vac is removed, she can wash the incision with soap and water, but this is the only time this should be wet.  She understands to place gauze or washcloth in groin over incision to help wick moisture to help prevent wound infection and  this was stressed to pt and [name].   She will return to see Dr. Trula Slade on 6/7.  She will call us sooner if she has any issues before then.     [name], [facility] [phone]  Clinic MD:  Pt seen and examined with Dr. Donnetta Hutching

## 2019-06-30 ENCOUNTER — Other Ambulatory Visit: Payer: Self-pay | Admitting: Vascular Surgery

## 2019-07-03 ENCOUNTER — Ambulatory Visit (INDEPENDENT_AMBULATORY_CARE_PROVIDER_SITE_OTHER): Payer: Self-pay | Admitting: Physician Assistant

## 2019-07-03 ENCOUNTER — Other Ambulatory Visit: Payer: Self-pay

## 2019-07-03 VITALS — BP 112/64 | HR 66 | Resp 16 | Ht 62.0 in | Wt 232.0 lb

## 2019-07-03 DIAGNOSIS — N186 End stage renal disease: Secondary | ICD-10-CM

## 2019-07-03 DIAGNOSIS — Z992 Dependence on renal dialysis: Secondary | ICD-10-CM

## 2019-07-03 NOTE — Progress Notes (Signed)
POST OPERATIVE OFFICE NOTE    CC:  F/u for surgery  HPI:  This is a 79 y.o. female who is s/p left thigh graft on 06/15/2019 by Dr. Trula Slade.  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  She saw Dr. Trula Slade back on 5/17 and at that time, pt did not want to have a right arm access given steal sx had affected her left arm.  She had a hx of cardiac cath via right groin and therefore, she had a left thigh graft placed on 5/20.  She was seen 06/20/2019 for left thigh incision drainage and edema.  She is here today for f/u incisional check.  She states her thigh is still tender medially, but otherwise it seems to be getting better.  She continues to have left LE edema.      Allergies  Allergen Reactions  . Adhesive [Tape] Itching, Swelling, Rash and Other (See Comments)    Tears skin and causes blisters also. EKG pads will cause welps.   . Avelox [Moxifloxacin] Swelling and Rash  . Blueberry Flavor Anaphylaxis  . Cefprozil Shortness Of Breath and Rash  . Cetacaine [Butamben-Tetracaine-Benzocaine] Nausea And Vomiting and Swelling  . Dicyclomine Nausea And Vomiting and Other (See Comments)    "Heart trouble"; Headaches and increased blood sugars "Heart trouble"; Headaches and increased blood sugars  . Food Anaphylaxis and Other (See Comments)    Melons, Bananas, Cantaloupes, Watermelon-throat closes up and blisters   . Imdur [Isosorbide Nitrate] Hives, Palpitations, Other (See Comments) and Rash    Headaches also  . Januvia [Sitagliptin] Shortness Of Breath  . Lipitor [Atorvastatin] Shortness Of Breath  . Losartan Potassium Shortness Of Breath  . Nitroglycerin Other (See Comments)    Caused cardiac arrest and feels like skin bring torn off back of head Caused cardiac arrest and feels like skin bring torn off back of head  . Oxycodone Hives and Rash    Tolerates Dilaudid Tolerates Dilaudid  . Penicillins Anaphylaxis   Has patient had a PCN reaction causing immediate rash, facial/tongue/throat swelling, SOB or lightheadedness with hypotension: Yes Has patient had a PCN reaction causing severe rash involving mucus membranes or skin necrosis: No Has patient had a PCN reaction that required hospitalization Yes Has patient had a PCN reaction occurring within the last 10 years: No If all of the above answers are "NO", then may proceed with Cephalosporin use. Has patient had a PCN reaction causing immediate rash, facial/tongue/throat swelling, SOB or lightheadedness with hypotension: Yes Has patient had a PCN reaction causing severe rash involving mucus membranes or skin necrosis: No Has patient had a PCN reaction that required hospitalization Yes Has patient had a PCN reaction occurring within the last 10 years: No If all of the above answers are "NO", then may proceed with Cephalosporin use.   . Prednisone Anaphylaxis  . Vancomycin Anaphylaxis  . Hydrocodone Hives    Tolerates Dilaudid  . Latex Other (See Comments) and Rash    blisters  . Tamiflu [Oseltamivir] Other (See Comments)    Contraindicated with other medications Patient on tikosyn, and tamiflu interfered with anti arrhythmic med Contraindicated with other medications Patient on tikosyn, and tamiflu interfered with anti arrhythmic med  . Feraheme [Ferumoxytol] Other (See Comments)    Sharp pain to lower back and flank  . Tramadol Itching    Reports burning and itching in certain places.  Must take a Benadryl right after.   Marland Kitchen  Zyrtec [Cetirizine] Other (See Comments)    unspecified  . Lasix [Furosemide] Hives, Swelling and Rash  . Mupirocin Rash    Current Outpatient Medications  Medication Sig Dispense Refill  . acetaminophen (TYLENOL) 500 MG tablet Take 0.5 tablets (250 mg total) by mouth every 6 (six) hours as needed for mild pain (pain).    Marland Kitchen amiodarone (PACERONE) 200 MG tablet TAKE ONE (1) TABLET EACH DAY (Patient taking differently: Take  200 mg by mouth daily. ) 90 tablet 1  . apixaban (ELIQUIS) 5 MG TABS tablet Take 1 tablet (5 mg total) by mouth 2 (two) times daily. 60 tablet 6  . B Complex-C-Folic Acid (RENAL VITAMIN) 0.8 MG TABS Take 0.8 mg by mouth daily.    . cetirizine (ZYRTEC) 10 MG tablet Take 5 mg by mouth 2 (two) times daily.     . clonazePAM (KLONOPIN) 1 MG tablet Take 0.5-1 mg by mouth 3 (three) times daily as needed for anxiety.     Marland Kitchen doxycycline (VIBRAMYCIN) 100 MG capsule Take 100 mg by mouth 2 (two) times daily.    Marland Kitchen estradiol (ESTRACE) 1 MG tablet Take 1 mg by mouth daily.    . famotidine (PEPCID) 20 MG tablet Take 40 mg by mouth daily.     . ferric citrate (AURYXIA) 1 GM 210 MG(Fe) tablet Take 210 mg by mouth 3 (three) times daily with meals.     Marland Kitchen glipiZIDE (GLUCOTROL XL) 10 MG 24 hr tablet Take 10 mg by mouth See admin instructions. Take Monday, Wednesday, Friday, Sunday   If CBG is  130 or less , do not take it.    . Insulin Glargine (LANTUS) 100 UNIT/ML Solostar Pen Inject 5-35 Units into the skin 2 (two) times daily. Before Lunch per sliding scale: Under 100= 20 units, 100-180= 25 units, 180-240= 30 units, Over 240= 35 units  At 10pm per sliding Scale: Under 100= 5 units, 100-200= 10 units, Over 200= 15 units    . levothyroxine (SYNTHROID) 150 MCG tablet Take 150 mcg by mouth daily before breakfast.     . ondansetron (ZOFRAN) 4 MG tablet Take 1 tablet (4 mg total) by mouth every 8 (eight) hours as needed for nausea or vomiting. 30 tablet 1  . simvastatin (ZOCOR) 20 MG tablet Take 20 mg by mouth daily at 6 PM.     . traMADol (ULTRAM) 50 MG tablet Take 1 tablet (50 mg total) by mouth every 8 (eight) hours as needed. 20 tablet 0  . vitamin B-12 (CYANOCOBALAMIN) 500 MCG tablet Take 500 mcg by mouth daily.     No current facility-administered medications for this visit.     ROS:  See HPI  Physical Exam:  Vitals:   07/03/19 1501  BP: 112/64  Pulse: 66  Resp: 16     Incision:  Left groin incision  has completely healed.  Distal loop incision has a scab without erythema or draiange Extremities:  LE/foot edema, warm and well perfused. Palpable left radial pulse with improved left hand  grip 4+/5. Lungs: non labored breathing   Assessment/Plan:  This is a 79 y.o. female who is s/p: Left thigh loop AV graft placement.  The loop incision still has a scab.  I would wait until the scab is completely healed before starting HD using the thigh graft.    Encouraged elevation of LE when at rest to assist with left foot/LE edema.  The graft may be accessed in 2 weeks once the scab has healed over  the distal loop graft.     Roxy Horseman PA-C [facility] [phone]  Clinic MD:  Trula Slade

## 2019-08-11 ENCOUNTER — Other Ambulatory Visit (HOSPITAL_COMMUNITY): Payer: Self-pay | Admitting: Nurse Practitioner

## 2019-08-22 DIAGNOSIS — Z95828 Presence of other vascular implants and grafts: Secondary | ICD-10-CM

## 2019-08-22 HISTORY — DX: Presence of other vascular implants and grafts: Z95.828

## 2019-09-11 ENCOUNTER — Telehealth: Payer: Self-pay | Admitting: *Deleted

## 2019-09-11 NOTE — Telephone Encounter (Signed)
Daughter called stating patient has been in severe pain since Saturday, 09/09/2019 and is unable to stand.  Patient expressed to [facility] the pain and was told it was normal but to call [facility]. An appointment was scheduled with a PA tomorrow morning before patient has dialysis at 11:00.

## 2019-09-12 ENCOUNTER — Emergency Department (HOSPITAL_COMMUNITY)
Admission: EM | Admit: 2019-09-12 | Discharge: 2019-09-12 | Disposition: A | Discharge status: Home / Self Care | Payer: Medicare HMO | Attending: Emergency Medicine | Admitting: Emergency Medicine

## 2019-09-12 ENCOUNTER — Ambulatory Visit (INDEPENDENT_AMBULATORY_CARE_PROVIDER_SITE_OTHER): Payer: Medicare HMO | Admitting: Physician Assistant

## 2019-09-12 ENCOUNTER — Encounter (HOSPITAL_COMMUNITY): Payer: Self-pay | Admitting: Emergency Medicine

## 2019-09-12 ENCOUNTER — Telehealth: Payer: Self-pay | Admitting: Vascular Surgery

## 2019-09-12 ENCOUNTER — Other Ambulatory Visit: Payer: Self-pay

## 2019-09-12 VITALS — BP 122/58 | HR 55 | Temp 97.5°F | Resp 20 | Ht 62.0 in | Wt 242.0 lb

## 2019-09-12 DIAGNOSIS — M79606 Pain in leg, unspecified: Secondary | ICD-10-CM | POA: Diagnosis present

## 2019-09-12 DIAGNOSIS — Z5321 Procedure and treatment not carried out due to patient leaving prior to being seen by health care provider: Secondary | ICD-10-CM | POA: Diagnosis not present

## 2019-09-12 DIAGNOSIS — Z992 Dependence on renal dialysis: Secondary | ICD-10-CM

## 2019-09-12 DIAGNOSIS — N186 End stage renal disease: Secondary | ICD-10-CM

## 2019-09-12 NOTE — Progress Notes (Signed)
    Postoperative Access Visit   History of Present Illness   [name] is a 79 y.o. year old female who presents for postoperative follow-up for: left thigh arteriovenous graft by Dr. Trula Slade (Date: 06/16/19).  She presents for evaluation of left thigh AV graft after experiencing severe pain with cannulation of graft during dialysis session this past Thursday.  She again had severe pain at [facility] dialysis session.  She states she was able to complete both hemodialysis treatments.  She has some edema of her left leg however she is been seen in office several times after graft placement for leg edema.  She denies any areas of redness, fevers, chills, nausea/vomiting.  She denies any bleeding or areas of hematoma.  She still has her right IJ [facility] however states she is scheduled to have it removed tomorrow.  Patient is on Eliquis for history of atrial fibrillation.  Physical Examination   Vitals:   09/12/19 0946  BP: (!) 122/58  Pulse: (!) 55  Resp: 20  Temp: (!) 97.5 F (36.4 C)  TempSrc: Temporal  SpO2: 98%  Weight: 242 lb (109.8 kg)  Height: 5\' 2"  (1.575 m)   Body mass index is 44.26 kg/m.  left thigh Incision is healed, palpable left DP pulse; palpable thrill throughout graft without areas of firm hematoma, erythema, or obvious infection; some pitting edema of left lower extremity however not impressive    Medical Decision Making   [name] is a 79 y.o. year old female who presents s/p left thigh arteriovenous graft with pain   Left thigh AV graft is patent with palpable thrill  Left foot is well-perfused with a palpable DP pulse  Based on my physical exam I do not palpate any firm hematoma that would suggest infiltration  Given patient's level of pain we will rest her left thigh AV graft and use her right IJ TDC for the next couple weeks  If left lower extremity edema worsens we can check a venous duplex to rule out DVT  If pain resolves over the next  couple weeks, we will continue HD via left thigh AV graft and eventually remove right IJ [facility]  Patient will call/return to office if symptoms fail to improve otherwise she will follow up as needed  Dagoberto Ligas PA-C [facility] [place] Office: [phone]  Clinic MD: Carlis Abbott

## 2019-09-12 NOTE — ED Triage Notes (Signed)
Patient reports R leg pain onset Saturday. Dialysis patient, sent by nephrologist for concern that "they hit a nerve" while accessing fistula

## 2019-09-12 NOTE — ED Notes (Signed)
Pt stated they were leaving  

## 2019-09-12 NOTE — Telephone Encounter (Signed)
This patient was seen in the office today by [facility] with pain in her left thigh AV graft.  On exam there was no evidence of bleeding or significant problems noted except for some mild swelling.  She was told not to use the graft for dialysis and to continue to use her tunneled dialysis catheter for now.  She subsequently went for dialysis today and they dialyzed her using her catheter.  However she was having increasing pain in the leg and spoke with the office who referred her to the emergency department.  The daughter [name] called me because she was told there would be a 10-hour wait and she did not want to wait this long given that her mother was having significant pain.  I encouraged her to discuss this with the charge nurse and explained that her pain was getting worse so hopefully she can be seen sooner and that I would be happy to see her when she was in her room.  Given that they did not cannulate the graft today and given that her exam in the office today was fairly unremarkable I think it is less likely that she is having a significant bleeding problem.  Her graft was placed in May.  However, if she does not stay in the emergency room tonight I suspect they will call the office tomorrow to be seen again. Allison Mayo, MD

## 2019-09-12 NOTE — ED Notes (Signed)
Outside

## 2019-09-13 ENCOUNTER — Telehealth: Payer: Self-pay | Admitting: *Deleted

## 2019-09-13 ENCOUNTER — Emergency Department (HOSPITAL_BASED_OUTPATIENT_CLINIC_OR_DEPARTMENT_OTHER): Payer: Medicare HMO

## 2019-09-13 ENCOUNTER — Other Ambulatory Visit: Payer: Self-pay

## 2019-09-13 ENCOUNTER — Emergency Department (HOSPITAL_BASED_OUTPATIENT_CLINIC_OR_DEPARTMENT_OTHER)
Admit: 2019-09-13 | Discharge: 2019-09-13 | Disposition: A | Discharge status: Home / Self Care | Payer: Medicare HMO | Attending: Vascular Surgery | Admitting: Vascular Surgery

## 2019-09-13 ENCOUNTER — Emergency Department (HOSPITAL_COMMUNITY)
Admission: EM | Admit: 2019-09-13 | Discharge: 2019-09-13 | Disposition: A | Discharge status: Home / Self Care | Payer: Medicare HMO | Attending: Emergency Medicine | Admitting: Emergency Medicine

## 2019-09-13 DIAGNOSIS — J45909 Unspecified asthma, uncomplicated: Secondary | ICD-10-CM | POA: Diagnosis not present

## 2019-09-13 DIAGNOSIS — I2510 Atherosclerotic heart disease of native coronary artery without angina pectoris: Secondary | ICD-10-CM | POA: Insufficient documentation

## 2019-09-13 DIAGNOSIS — Z7901 Long term (current) use of anticoagulants: Secondary | ICD-10-CM | POA: Insufficient documentation

## 2019-09-13 DIAGNOSIS — I5032 Chronic diastolic (congestive) heart failure: Secondary | ICD-10-CM | POA: Diagnosis not present

## 2019-09-13 DIAGNOSIS — R52 Pain, unspecified: Secondary | ICD-10-CM

## 2019-09-13 DIAGNOSIS — Z91040 Latex allergy status: Secondary | ICD-10-CM | POA: Insufficient documentation

## 2019-09-13 DIAGNOSIS — I132 Hypertensive heart and chronic kidney disease with heart failure and with stage 5 chronic kidney disease, or end stage renal disease: Secondary | ICD-10-CM | POA: Insufficient documentation

## 2019-09-13 DIAGNOSIS — Z794 Long term (current) use of insulin: Secondary | ICD-10-CM | POA: Insufficient documentation

## 2019-09-13 DIAGNOSIS — E1122 Type 2 diabetes mellitus with diabetic chronic kidney disease: Secondary | ICD-10-CM | POA: Diagnosis not present

## 2019-09-13 DIAGNOSIS — E1140 Type 2 diabetes mellitus with diabetic neuropathy, unspecified: Secondary | ICD-10-CM | POA: Diagnosis not present

## 2019-09-13 DIAGNOSIS — M7989 Other specified soft tissue disorders: Secondary | ICD-10-CM | POA: Diagnosis not present

## 2019-09-13 DIAGNOSIS — N186 End stage renal disease: Secondary | ICD-10-CM | POA: Insufficient documentation

## 2019-09-13 DIAGNOSIS — R600 Localized edema: Secondary | ICD-10-CM | POA: Diagnosis not present

## 2019-09-13 DIAGNOSIS — Z79890 Hormone replacement therapy (postmenopausal): Secondary | ICD-10-CM | POA: Diagnosis not present

## 2019-09-13 DIAGNOSIS — M79605 Pain in left leg: Secondary | ICD-10-CM

## 2019-09-13 DIAGNOSIS — Z992 Dependence on renal dialysis: Secondary | ICD-10-CM | POA: Insufficient documentation

## 2019-09-13 LAB — LACTIC ACID, PLASMA: Lactic Acid, Venous: 3 mmol/L (ref 0.5–1.9)

## 2019-09-13 LAB — COMPREHENSIVE METABOLIC PANEL
ALT: 15 U/L (ref 0–44)
AST: 20 U/L (ref 15–41)
Albumin: 3.1 g/dL — ABNORMAL LOW (ref 3.5–5.0)
Alkaline Phosphatase: 52 U/L (ref 38–126)
Anion gap: 13 (ref 5–15)
BUN: 15 mg/dL (ref 8–23)
CO2: 30 mmol/L (ref 22–32)
Calcium: 9.2 mg/dL (ref 8.9–10.3)
Chloride: 93 mmol/L — ABNORMAL LOW (ref 98–111)
Creatinine, Ser: 4.28 mg/dL — ABNORMAL HIGH (ref 0.44–1.00)
GFR calc Af Amer: 11 mL/min — ABNORMAL LOW (ref >60)
GFR calc non Af Amer: 9 mL/min — ABNORMAL LOW (ref >60)
Glucose, Bld: 239 mg/dL — ABNORMAL HIGH (ref 70–99)
Potassium: 3.4 mmol/L — ABNORMAL LOW (ref 3.5–5.1)
Sodium: 136 mmol/L (ref 135–145)
Total Bilirubin: 0.3 mg/dL (ref 0.3–1.2)
Total Protein: 6.3 g/dL — ABNORMAL LOW (ref 6.5–8.1)

## 2019-09-13 LAB — CBC WITH DIFFERENTIAL/PLATELET
Abs Immature Granulocytes: 0.04 10*3/uL (ref 0.00–0.07)
Basophils Absolute: 0.1 10*3/uL (ref 0.0–0.1)
Basophils Relative: 1 %
Eosinophils Absolute: 0.2 10*3/uL (ref 0.0–0.5)
Eosinophils Relative: 2 %
HCT: 35.1 % — ABNORMAL LOW (ref 36.0–46.0)
Hemoglobin: 10.5 g/dL — ABNORMAL LOW (ref 12.0–15.0)
Immature Granulocytes: 1 %
Lymphocytes Relative: 25 %
Lymphs Abs: 1.6 10*3/uL (ref 0.7–4.0)
MCH: 31.3 pg (ref 26.0–34.0)
MCHC: 29.9 g/dL — ABNORMAL LOW (ref 30.0–36.0)
MCV: 104.8 fL — ABNORMAL HIGH (ref 80.0–100.0)
Monocytes Absolute: 0.8 10*3/uL (ref 0.1–1.0)
Monocytes Relative: 13 %
Neutro Abs: 3.7 10*3/uL (ref 1.7–7.7)
Neutrophils Relative %: 58 %
Platelets: 232 10*3/uL (ref 150–400)
RBC: 3.35 MIL/uL — ABNORMAL LOW (ref 3.87–5.11)
RDW: 13.9 % (ref 11.5–15.5)
WBC: 6.4 10*3/uL (ref 4.0–10.5)
nRBC: 0 % (ref 0.0–0.2)

## 2019-09-13 NOTE — Discharge Instructions (Signed)
Your history, exam, and work-up today shows evidence of swelling in the left leg that does not appear to be from a blood clot or problem with the fistula.  Clinically we do not suspect you have cellulitis.  I suspect the clamping from the other day you described likely cause fluid to pull in your leg and you are still feeling some symptoms from this.  Please elevate your leg and use compression stockings as we discussed.  Please follow-up with your vascular surgery team.  If any symptoms change or worsen including new fevers or evidence of new rash or infection appearance, please return to the nearest emergency department.

## 2019-09-13 NOTE — ED Provider Notes (Signed)
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    Discharge Medication List as of 09/13/2019 10:18 PM      Follow Up: Wannetta Sender, FNP [facility] [address] [place] [address] [phone]     your vascular surgery team     [place] [address] [id number] mc [place]       Glenard Keesling, Gwenyth Allegra, MD 09/14/19 0010

## 2019-09-13 NOTE — Telephone Encounter (Signed)
Spoke with pts daughter while she is waiting in the ED with her mother. States pt is in a lot of pain and wants to know why she hasn't been seen sooner. Dialysis graft duplex has been ordered to be done in the hospital. She also has an appt to be seen in the office tomorrow. Pt chose to stay in the ED as transport is very painful for her.

## 2019-09-13 NOTE — Progress Notes (Signed)
Left lower extremity venous duplex and left thigh dialysis access duplex has been completed. Preliminary results can be found in CV Proc through chart review.  Results were given to Dr. Sherry Ruffing.  09/13/19 6:08 PM [name] RVT

## 2019-09-13 NOTE — ED Triage Notes (Addendum)
Pt reports L leg pain and swelling onset Saturday. Sts unable to bear weight on the leg. LWBS seen for same last night. Dialysis pt, had full tx yesterday.

## 2019-09-13 NOTE — Telephone Encounter (Signed)
Spoke with pts daughter regarding severe pain in pts leg. They went to the ED yesterday and were unable to wait long enough to be seen. Spoke with [place] PA. There is nothing we can do for her at the office. Advised the pt and her daughter to try an urgent care.

## 2019-09-13 NOTE — ED Notes (Signed)
Date and time results received: 09/13/19 1443 (use smartphrase ".now" to insert current time)  Test: LACTIC Critical Value: 3.0  Name of Provider Notified: [facility]  Orders Received? Or Actions Taken?: .

## 2019-09-14 ENCOUNTER — Ambulatory Visit: Payer: Medicare HMO | Admitting: Vascular Surgery

## 2019-09-14 ENCOUNTER — Inpatient Hospital Stay (HOSPITAL_COMMUNITY): Admit: 2019-09-14 | Payer: Medicare HMO

## 2019-09-14 ENCOUNTER — Telehealth: Payer: Self-pay

## 2019-09-14 ENCOUNTER — Other Ambulatory Visit: Payer: Self-pay | Admitting: Vascular Surgery

## 2019-09-14 MED ORDER — TRAMADOL HCL 50 MG PO TABS: 50.0000 mg | ORAL_TABLET | Freq: Three times a day (TID) | ORAL | 0 refills | Status: DC | PRN

## 2019-09-14 MED ORDER — TRAMADOL HCL 50 MG PO TABS: 50.0000 mg | ORAL_TABLET | Freq: Four times a day (QID) | ORAL | 0 refills | Status: DC | PRN

## 2019-09-14 NOTE — Telephone Encounter (Addendum)
Per Dr. Scot Dock, Tramadol was called sent to her Pharmacy on file.  Message was given to daughter and she verbalized understanding.  Brin Ruggerio.  ----- Message from Angelia Mould, MD sent at 09/14/2019 10:17 AM EDT ----- Regarding: RE: Pt advice I think a knee high compression stocking would be fine but would not recommend a thigh-high stocking if this covers part of her thigh AV graft.  Thank you. CD ----- Message ----- From: Kaleen Mask, LPN Sent: 5/85/9292  10:07 AM EDT To: Angelia Mould, MD Subject: Pt advice                                      Good Morning.  Pts daughter [name] called this morning.  She said she took pt to the ED yesterday for leg swelling.  She thinks Dialysis put a clamp on it when they should not have.  The dopplar "looked good" and says they suggested rest, elevation, an ace wrap and compression stockings. She wanted to see if you approved of this assessment for compression stockings and also said they would not fill her Tramadol for pain.    Please advise.  Thanks,  Thurston Hole., LPN

## 2019-10-04 ENCOUNTER — Ambulatory Visit (HOSPITAL_COMMUNITY): Payer: Medicare HMO | Admitting: Nurse Practitioner

## 2019-10-09 ENCOUNTER — Encounter (HOSPITAL_COMMUNITY): Payer: Self-pay | Admitting: Nurse Practitioner

## 2019-10-09 ENCOUNTER — Ambulatory Visit (HOSPITAL_COMMUNITY)
Admission: RE | Admit: 2019-10-09 | Discharge: 2019-10-09 | Disposition: A | Discharge status: Home / Self Care | Payer: Medicare HMO | Source: Ambulatory Visit | Attending: Nurse Practitioner | Admitting: Nurse Practitioner

## 2019-10-09 ENCOUNTER — Other Ambulatory Visit: Payer: Self-pay

## 2019-10-09 VITALS — BP 136/60 | HR 65 | Ht 62.0 in | Wt 229.4 lb

## 2019-10-09 DIAGNOSIS — I4892 Unspecified atrial flutter: Secondary | ICD-10-CM | POA: Insufficient documentation

## 2019-10-09 DIAGNOSIS — E669 Obesity, unspecified: Secondary | ICD-10-CM | POA: Insufficient documentation

## 2019-10-09 DIAGNOSIS — E039 Hypothyroidism, unspecified: Secondary | ICD-10-CM | POA: Insufficient documentation

## 2019-10-09 DIAGNOSIS — Z992 Dependence on renal dialysis: Secondary | ICD-10-CM | POA: Diagnosis not present

## 2019-10-09 DIAGNOSIS — Z6830 Body mass index (BMI) 30.0-30.9, adult: Secondary | ICD-10-CM | POA: Insufficient documentation

## 2019-10-09 DIAGNOSIS — E1122 Type 2 diabetes mellitus with diabetic chronic kidney disease: Secondary | ICD-10-CM | POA: Insufficient documentation

## 2019-10-09 DIAGNOSIS — I132 Hypertensive heart and chronic kidney disease with heart failure and with stage 5 chronic kidney disease, or end stage renal disease: Secondary | ICD-10-CM | POA: Insufficient documentation

## 2019-10-09 DIAGNOSIS — D6869 Other thrombophilia: Secondary | ICD-10-CM

## 2019-10-09 DIAGNOSIS — N186 End stage renal disease: Secondary | ICD-10-CM | POA: Insufficient documentation

## 2019-10-09 DIAGNOSIS — E785 Hyperlipidemia, unspecified: Secondary | ICD-10-CM | POA: Insufficient documentation

## 2019-10-09 DIAGNOSIS — I5032 Chronic diastolic (congestive) heart failure: Secondary | ICD-10-CM | POA: Insufficient documentation

## 2019-10-09 DIAGNOSIS — K219 Gastro-esophageal reflux disease without esophagitis: Secondary | ICD-10-CM | POA: Insufficient documentation

## 2019-10-09 DIAGNOSIS — Z79899 Other long term (current) drug therapy: Secondary | ICD-10-CM | POA: Insufficient documentation

## 2019-10-09 DIAGNOSIS — I252 Old myocardial infarction: Secondary | ICD-10-CM | POA: Insufficient documentation

## 2019-10-09 DIAGNOSIS — K449 Diaphragmatic hernia without obstruction or gangrene: Secondary | ICD-10-CM | POA: Diagnosis not present

## 2019-10-09 DIAGNOSIS — E559 Vitamin D deficiency, unspecified: Secondary | ICD-10-CM | POA: Insufficient documentation

## 2019-10-09 DIAGNOSIS — Z7901 Long term (current) use of anticoagulants: Secondary | ICD-10-CM | POA: Insufficient documentation

## 2019-10-09 DIAGNOSIS — I2510 Atherosclerotic heart disease of native coronary artery without angina pectoris: Secondary | ICD-10-CM | POA: Diagnosis not present

## 2019-10-09 DIAGNOSIS — I4819 Other persistent atrial fibrillation: Secondary | ICD-10-CM | POA: Diagnosis not present

## 2019-10-09 DIAGNOSIS — F419 Anxiety disorder, unspecified: Secondary | ICD-10-CM | POA: Diagnosis not present

## 2019-10-09 DIAGNOSIS — I428 Other cardiomyopathies: Secondary | ICD-10-CM | POA: Diagnosis not present

## 2019-10-09 DIAGNOSIS — Z794 Long term (current) use of insulin: Secondary | ICD-10-CM | POA: Insufficient documentation

## 2019-10-09 DIAGNOSIS — I480 Paroxysmal atrial fibrillation: Secondary | ICD-10-CM | POA: Diagnosis present

## 2019-10-09 DIAGNOSIS — Z8249 Family history of ischemic heart disease and other diseases of the circulatory system: Secondary | ICD-10-CM | POA: Insufficient documentation
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
## 2019-10-17 ENCOUNTER — Other Ambulatory Visit (HOSPITAL_COMMUNITY): Payer: Self-pay

## 2019-10-17 MED ORDER — AMIODARONE HCL 200 MG PO TABS: ORAL_TABLET | ORAL | 1 refills | Status: DC

## 2019-11-01 ENCOUNTER — Other Ambulatory Visit: Payer: Self-pay

## 2019-11-01 ENCOUNTER — Ambulatory Visit (INDEPENDENT_AMBULATORY_CARE_PROVIDER_SITE_OTHER): Payer: Medicare HMO | Admitting: Physician Assistant

## 2019-11-01 ENCOUNTER — Ambulatory Visit (HOSPITAL_COMMUNITY)
Admission: RE | Admit: 2019-11-01 | Discharge: 2019-11-01 | Disposition: A | Discharge status: Home / Self Care | Payer: Medicare HMO | Source: Ambulatory Visit | Attending: Vascular Surgery | Admitting: Vascular Surgery

## 2019-11-01 ENCOUNTER — Other Ambulatory Visit (HOSPITAL_COMMUNITY): Payer: Self-pay | Admitting: Vascular Surgery

## 2019-11-01 VITALS — BP 121/61 | HR 69 | Temp 98.1°F | Resp 20 | Ht 62.0 in | Wt 231.9 lb

## 2019-11-01 DIAGNOSIS — M79605 Pain in left leg: Secondary | ICD-10-CM

## 2019-11-01 DIAGNOSIS — Z992 Dependence on renal dialysis: Secondary | ICD-10-CM

## 2019-11-01 DIAGNOSIS — N186 End stage renal disease: Secondary | ICD-10-CM | POA: Diagnosis not present

## 2019-11-01 MED ORDER — LIDOCAINE-PRILOCAINE 2.5-2.5 % EX CREA
1.0000 | TOPICAL_CREAM | CUTANEOUS | 3 refills | Status: DC | PRN
Start: 2019-11-01 — End: 2021-03-01

## 2019-11-01 NOTE — Progress Notes (Signed)
POST OPERATIVE DIALYSIS ACCESS OFFICE NOTE    CC:  F/u for dialysis access surgery  HPI:  This is a 79 y.o. female who is s/p placement of left thigh arteriovenous graft by Dr. Trula Slade on Jun 16, 2019.  She was last seen in our office on September 12, 2019 and at that time she was complaining of significant pain with cannulation of her graft during dialysis.  She had no evidence of graft thrombosis or hematoma to suggest extravasation.  She was been seen earlier secondary to lower extremity edema.  Duplex study of her left lower extremity as well as her AV graft did not demonstrate any acute abnormality.  There was no evidence of deep vein thrombosis and her graft was patent.  She was advised to rest the AVG and use her [facility] for the next several weeks. Current complaint is excruciating pain when needles are going into her graft. Her dialysis staff has tried several options including smaller needles. She is using local anesthetic cream prior to treatment. She denies fever or chills. She denies left lower extremity or foot pain. She suffers from diabetic neuropathy.  Dialysis days:  TTS   Dialysis center: [facility]  Allergies  Allergen Reactions  . Adhesive [Tape] Itching, Swelling, Rash and Other (See Comments)    Tears skin and causes blisters also. EKG pads will cause welps.   . Avelox [Moxifloxacin] Swelling and Rash  . Blueberry Flavor Anaphylaxis  . Cefprozil Shortness Of Breath and Rash  . Cetacaine [Butamben-Tetracaine-Benzocaine] Nausea And Vomiting and Swelling  . Dicyclomine Nausea And Vomiting and Other (See Comments)    "Heart trouble"; Headaches and increased blood sugars "Heart trouble"; Headaches and increased blood sugars  . Food Anaphylaxis and Other (See Comments)    Melons, Bananas, Cantaloupes, Watermelon-throat closes up and blisters   . Imdur [Isosorbide Nitrate] Hives, Palpitations, Other (See Comments) and Rash    Headaches also  . Januvia [Sitagliptin] Shortness Of  Breath  . Lipitor [Atorvastatin] Shortness Of Breath  . Losartan Potassium Shortness Of Breath  . Nitroglycerin Other (See Comments)    Caused cardiac arrest and feels like skin bring torn off back of head Caused cardiac arrest and feels like skin bring torn off back of head  . Oxycodone Hives and Rash    Tolerates Dilaudid Tolerates Dilaudid  . Penicillins Anaphylaxis    Has patient had a PCN reaction causing immediate rash, facial/tongue/throat swelling, SOB or lightheadedness with hypotension: Yes Has patient had a PCN reaction causing severe rash involving mucus membranes or skin necrosis: No Has patient had a PCN reaction that required hospitalization Yes Has patient had a PCN reaction occurring within the last 10 years: No If all of the above answers are "NO", then may proceed with Cephalosporin use. Has patient had a PCN reaction causing immediate rash, facial/tongue/throat swelling, SOB or lightheadedness with hypotension: Yes Has patient had a PCN reaction causing severe rash involving mucus membranes or skin necrosis: No Has patient had a PCN reaction that required hospitalization Yes Has patient had a PCN reaction occurring within the last 10 years: No If all of the above answers are "NO", then may proceed with Cephalosporin use.   . Prednisone Anaphylaxis  . Vancomycin Anaphylaxis  . Hydrocodone Hives    Tolerates Dilaudid  . Latex Other (See Comments) and Rash    blisters  . Tamiflu [Oseltamivir] Other (See Comments)    Contraindicated with other medications Patient on tikosyn, and tamiflu interfered with anti arrhythmic med  Contraindicated with other medications Patient on tikosyn, and tamiflu interfered with anti arrhythmic med  . Feraheme [Ferumoxytol] Other (See Comments)    Sharp pain to lower back and flank  . Zyrtec [Cetirizine] Other (See Comments)    unspecified  . Lasix [Furosemide] Hives, Swelling and Rash  . Mupirocin Rash    Current Outpatient  Medications  Medication Sig Dispense Refill  . acetaminophen (TYLENOL) 500 MG tablet Take 0.5 tablets (250 mg total) by mouth every 6 (six) hours as needed for mild pain (pain).    Marland Kitchen amiodarone (PACERONE) 200 MG tablet TAKE ONE (1) TABLET EACH DAY 90 tablet 1  . B Complex-C-Folic Acid (RENAL VITAMIN) 0.8 MG TABS Take 0.8 mg by mouth daily.    . cetirizine (ZYRTEC) 10 MG tablet Take 10 mg by mouth daily.     . clonazePAM (KLONOPIN) 1 MG tablet Take 0.5-1 mg by mouth 3 (three) times daily as needed for anxiety.     Marland Kitchen ELIQUIS 5 MG TABS tablet TAKE ONE TABLET BY MOUTH TWICE DAILY 60 tablet 6  . estradiol (ESTRACE) 1 MG tablet Take 1 mg by mouth daily.    . famotidine (PEPCID) 20 MG tablet Take 40 mg by mouth daily.     . ferric citrate (AURYXIA) 1 GM 210 MG(Fe) tablet Take 210 mg by mouth 5 (five) times daily. Takes with a meal    . ferric gluconate (NULECIT) 12.5 MG/ML injection Inject into the vein.    Marland Kitchen glipiZIDE (GLUCOTROL XL) 10 MG 24 hr tablet Take 10 mg by mouth See admin instructions. Take Monday, Wednesday, Friday, Sunday   If CBG is  130 or less , do not take it.    . Insulin Glargine (LANTUS) 100 UNIT/ML Solostar Pen Inject 5-35 Units into the skin 2 (two) times daily. Before Lunch per sliding scale: Under 100= 20 units, 100-180= 25 units, 180-240= 30 units, Over 240= 35 units  At 10pm per sliding Scale: Under 100= 5 units, 100-200= 10 units, Over 200= 15 units    . ondansetron (ZOFRAN) 4 MG tablet Take 1 tablet (4 mg total) by mouth every 8 (eight) hours as needed for nausea or vomiting. 30 tablet 1  . promethazine (PHENERGAN) 25 MG tablet Take 25 mg by mouth every 6 (six) hours as needed.    . simvastatin (ZOCOR) 10 MG tablet Take 10 mg by mouth daily.     Marland Kitchen SYNTHROID 175 MCG tablet Take 175 mcg by mouth daily.    . traMADol (ULTRAM) 50 MG tablet Take 1 tablet (50 mg total) by mouth every 8 (eight) hours as needed. (Patient taking differently: Take 50 mg by mouth 2 (two) times daily. )  20 tablet 0  . TRUE METRIX BLOOD GLUCOSE TEST test strip     . TRUEplus Lancets 33G MISC     . vitamin B-12 (CYANOCOBALAMIN) 500 MCG tablet Take 500 mcg by mouth daily.    Marland Kitchen VITAMIN D PO by Intravenous (Continuous Infusion) route. Given Vitamin D Liquid only on Thursday     No current facility-administered medications for this visit.     ROS:  See HPI  Vitals:   11/01/19 1211  BP: 121/61  Pulse: 69  Resp: 20  Temp: 98.1 F (36.7 C)  SpO2: 96%    Physical Exam:  General appearance: WD, WN in NAD Cardiac: RRR Respiratory: nonlabored Extremities:  No skin issues overlying graft. Good thrill and bruit in graft. Palpable left DP pulse.  Dialysis duplex on 11/01/2019  AVG         PSV (cm/s)Flow Vol (mL/min)Describe  +--------------------+----------+-----------------+--------+  Native artery inflow  130     1307          +--------------------+----------+-----------------+--------+  Arterial anastomosis  123                 +--------------------+----------+-----------------+--------+  Prox graft       109                 +--------------------+----------+-----------------+--------+  Mid graft        109                 +--------------------+----------+-----------------+--------+  Venous anastomosis   118                 +--------------------+----------+-----------------+--------+  Venous outflow     114                 +--------------------+----------+-----------------+--------+    Summary:  Patent left thigh AVG with no visualized stenosis, The was a technically  difficult exam.   Assessment/Plan:  79 yo female experiencing severe pain when accessing AVG.  No evidence of stenosis on duplex study (reviewed with Dr. Scot Dock).  Unable to prescribe Hurricaine spray as outpatient. EMLA cream prescribed.  Explained if she continues to have  intolerable pain, the next step would be to discuss new access in right thigh with surgeon.  -Barbie Banner, PA-C 11/01/2019 11:51 AM [facility] [phone]  Clinic MD:  Scot Dock

## 2019-12-08 ENCOUNTER — Other Ambulatory Visit: Payer: Self-pay

## 2019-12-08 ENCOUNTER — Telehealth: Payer: Self-pay

## 2019-12-08 MED FILL — ETHYL CHLORIDE SPRAY: 1 days supply | Qty: 116 | Fill #0

## 2019-12-08 NOTE — Telephone Encounter (Signed)
[name] called at 12:17.  Please call back regarding the graft in the patient's leg.

## 2019-12-08 NOTE — Telephone Encounter (Signed)
Spoke with patients daughter she states the Emla cream was not working to numb her access site. She states the Dr at Dialysis wrote a script for ethylchloride spray but they are unable to find a pharmacy that carries it. Told patient daughter I would check with [facility] and call her back. Spoke with Junie Panning at [facility] she states they can order this for the patient. Patient will need to bring Rx to them and they will order this approximate cost would be $30 dollars. Called patients daughter and gave her the information including address and phone number of the pharmacy.

## 2019-12-18 ENCOUNTER — Ambulatory Visit: Payer: Medicare HMO | Admitting: Physician Assistant

## 2019-12-29 ENCOUNTER — Telehealth (HOSPITAL_COMMUNITY): Payer: Self-pay | Admitting: *Deleted

## 2019-12-29 MED ORDER — AMIODARONE HCL 200 MG PO TABS: 100.0000 mg | ORAL_TABLET | Freq: Every day | ORAL | 1 refills | Status: DC

## 2019-12-29 NOTE — Telephone Encounter (Signed)
Patient called in stating she continues having issues with hypotension with dialysis and she would like to try decreasing her amiodarone back to 100mg  a day and see if this will help with her BP. She will call back if any issues arise with HR/Rhythm.

## 2020-01-04 ENCOUNTER — Ambulatory Visit (INDEPENDENT_AMBULATORY_CARE_PROVIDER_SITE_OTHER): Payer: Medicare HMO | Admitting: Nurse Practitioner

## 2020-01-04 ENCOUNTER — Other Ambulatory Visit (INDEPENDENT_AMBULATORY_CARE_PROVIDER_SITE_OTHER): Payer: Medicare HMO

## 2020-01-04 ENCOUNTER — Encounter: Payer: Self-pay | Admitting: Nurse Practitioner

## 2020-01-04 VITALS — BP 132/76 | HR 91 | Ht 62.0 in | Wt 223.0 lb

## 2020-01-04 DIAGNOSIS — Z86010 Personal history of colonic polyps: Secondary | ICD-10-CM

## 2020-01-04 DIAGNOSIS — R110 Nausea: Secondary | ICD-10-CM | POA: Diagnosis not present

## 2020-01-04 DIAGNOSIS — K921 Melena: Secondary | ICD-10-CM | POA: Diagnosis not present

## 2020-01-04 DIAGNOSIS — R634 Abnormal weight loss: Secondary | ICD-10-CM | POA: Diagnosis not present

## 2020-01-04 LAB — CBC
HCT: 38.6 % (ref 36.0–46.0)
Hemoglobin: 12.4 g/dL (ref 12.0–15.0)
MCHC: 32.2 g/dL (ref 30.0–36.0)
MCV: 97.8 fl (ref 78.0–100.0)
Platelets: 220 10*3/uL (ref 150.0–400.0)
RBC: 3.94 Mil/uL (ref 3.87–5.11)
RDW: 15.2 % (ref 11.5–15.5)
WBC: 8.5 10*3/uL (ref 4.0–10.5)

## 2020-01-04 MED ORDER — OMEPRAZOLE 40 MG PO CPDR
40.0000 mg | DELAYED_RELEASE_CAPSULE | Freq: Two times a day (BID) | ORAL | 3 refills | Status: DC
Start: 2020-01-04 — End: 2020-03-12

## 2020-01-04 NOTE — Patient Instructions (Signed)
If you are age 77 or older, your body mass index should be between 23-30. Your Body mass index is 40.79 kg/m. If this is out of the aforementioned range listed, please consider follow up with your Primary Care Provider.  If you are age 48 or younger, your body mass index should be between 19-25. Your Body mass index is 40.79 kg/m. If this is out of the aformentioned range listed, please consider follow up with your Primary Care Provider.   Your provider has requested that you go to the basement level for lab work before leaving today. Press "B" on the elevator. The lab is located at the first door on the left as you exit the elevator.  We have sent the following medications to your pharmacy for you to pick up at your convenience: Omeprazole 40 mg twice daily.   Stop Pepcid.
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
## 2020-01-05 ENCOUNTER — Telehealth: Payer: Self-pay | Admitting: Nurse Practitioner

## 2020-01-05 NOTE — Progress Notes (Signed)
Reviewed and agree with management plans. ? ?Lijah Bourque L. Tinley Rought, MD, MPH  ?

## 2020-01-05 NOTE — Telephone Encounter (Addendum)
[name] is very concerned that we can not get her mom in until Jan 24 to have a endo/colon. Daughter states mom has been vomiting and has diarrhea that is black tarry for weeks now. Daughter is concerned with mom history that this could be cancer and we are just putting it off for another month. Daughter would like to speak with Nevin Bloodgood. Daughter did say Monday Jan 24 will work but would like to try and get her in sooner.

## 2020-01-05 NOTE — Progress Notes (Signed)
Reviewed and agree with management plan.  Dougles Kimmey T. Keenan Trefry, MD FACG [facility]  

## 2020-01-08 NOTE — Telephone Encounter (Signed)
[name] called again, requesting a call back as soon as possible.  Is very concerned for mother and is wanting to know what to do.  Please advise.

## 2020-01-10 ENCOUNTER — Other Ambulatory Visit: Payer: Self-pay

## 2020-01-10 ENCOUNTER — Telehealth: Payer: Self-pay

## 2020-01-10 DIAGNOSIS — K921 Melena: Secondary | ICD-10-CM

## 2020-01-10 NOTE — Telephone Encounter (Signed)
Called daughter. No answer. Left a message to return my call.

## 2020-01-10 NOTE — Telephone Encounter (Signed)
Spoke with Ms. Norville Haggard, the daughter of the patient. She agrees to the following Pre-visit 01/24/20 at 2:30 pm. She requests this to be on the telephone because this is a dialysis day for the patient. Covid Screening test 01/30/20 at 10:00 am [place] 02/02/20 at 6:30 am

## 2020-01-10 NOTE — Telephone Encounter (Signed)
[facility] Pre-operative Risk Assessment     Request for surgical clearance:     Endoscopy Procedure  What type of surgery is being performed?     EGD and colonoscopy  When is this surgery scheduled?     02/02/2020  What type of clearance is required ?   Pharmacy  Are there any medications that need to be held prior to surgery and how long? Eliquis 2 days  Practice name and name of physician performing surgery?      [facility]  What is your office phone and fax number?      Phone- [phone]  [phone]  Anesthesia type (None, local, MAC, general) ?       MAC

## 2020-01-11 NOTE — Telephone Encounter (Signed)
Patient with diagnosis of afib on Eliquis for anticoagulation.    Procedure: EGD and colonoscopy Date of procedure: 02/02/20  CHA2DS2-VASc Score = 7  This indicates a 11.2% annual risk of stroke. The patient's score is based upon: CHF History: Yes HTN History: Yes Diabetes History: Yes Stroke History: No Vascular Disease History: Yes Age Score: 2 Gender Score: 1   Pt is on dialysis. Platelet count 232K  Request is to hold Eliquis for 2 days prior. Will route to MD for input given higher CV risk due to elevated [id number] score, complicated by potential reduced med clearance due to pt being on dialysis.

## 2020-01-11 NOTE — Telephone Encounter (Signed)
   Primary Cardiologist: Peter [name], MD  Chart reviewed as part of pre-operative protocol coverage. Given past medical history and time since last visit, based on ACC/AHA guidelines, [name] would be at acceptable risk for the planned procedure without further cardiovascular testing.   Patient with diagnosis of afib on Eliquis for anticoagulation.    Procedure: EGD and colonoscopy Date of procedure: 02/02/20  CHA2DS2-VASc Score = 7  This indicates a 11.2% annual risk of stroke. The patient's score is based upon: CHF History: Yes HTN History: Yes Diabetes History: Yes Stroke History: No Vascular Disease History: Yes Age Score: 2 Gender Score: 1   Pt is on dialysis. Platelet count 232K  Her Eliquis may be held for 3 days prior to her procedure.  Please resume as soon as hemostasis is achieved.  I will route this recommendation to the requesting party via Epic fax function and remove from pre-op pool.  Please call with questions.  Jossie Ng. [name] NP-C    01/11/2020, 10:02 AM [place] [address] Office [phone] Fax [phone]

## 2020-01-11 NOTE — Telephone Encounter (Signed)
I would hold Eliquis for 3 days for endoscopic procedures  Yoel Kaufhold [name] MD, [facility]

## 2020-01-15 ENCOUNTER — Ambulatory Visit: Payer: Medicare HMO | Admitting: Physician Assistant

## 2020-01-22 ENCOUNTER — Telehealth: Payer: Self-pay | Admitting: Nurse Practitioner

## 2020-01-22 NOTE — Telephone Encounter (Signed)
Called daughter [name]) back and got voice mail. Left message that a CBC is due this week, to recheck her Mom's Hbg. And gave her the hours of our lab that she can bring her in. Asked her to please call back if any questions.

## 2020-01-22 NOTE — Telephone Encounter (Signed)
Returned patient's daughter's call. [name]) she is concerned because she said her Mom's Hbg was low at dialysis yesterday and they had to give her something ? She wasn't sure what. I asked her to bring her Mom into our lab on Wed. (she can't come tomorrow because of Dialysis) to get her CBC drawn and she agreed.

## 2020-01-22 NOTE — Telephone Encounter (Signed)
[name] returning your call.

## 2020-01-22 NOTE — Telephone Encounter (Signed)
Inbound call from patient's daughter requesting a call back please.  States that her hemoglobin is low and has been getting injections to help bring it back up.

## 2020-01-24 ENCOUNTER — Other Ambulatory Visit (INDEPENDENT_AMBULATORY_CARE_PROVIDER_SITE_OTHER): Payer: Medicare HMO

## 2020-01-24 ENCOUNTER — Other Ambulatory Visit: Payer: Self-pay

## 2020-01-24 ENCOUNTER — Ambulatory Visit (AMBULATORY_SURGERY_CENTER): Payer: Medicare HMO | Admitting: *Deleted

## 2020-01-24 VITALS — Ht 62.0 in | Wt 232.0 lb

## 2020-01-24 DIAGNOSIS — R634 Abnormal weight loss: Secondary | ICD-10-CM

## 2020-01-24 DIAGNOSIS — R110 Nausea: Secondary | ICD-10-CM

## 2020-01-24 DIAGNOSIS — K921 Melena: Secondary | ICD-10-CM

## 2020-01-24 DIAGNOSIS — Z800 Family history of malignant neoplasm of digestive organs: Secondary | ICD-10-CM

## 2020-01-24 DIAGNOSIS — Z01818 Encounter for other preprocedural examination: Secondary | ICD-10-CM

## 2020-01-24 DIAGNOSIS — Z86010 Personal history of colonic polyps: Secondary | ICD-10-CM

## 2020-01-24 LAB — CBC WITH DIFFERENTIAL/PLATELET
Basophils Absolute: 0.1 10*3/uL (ref 0.0–0.1)
Basophils Relative: 1 % (ref 0.0–3.0)
Eosinophils Absolute: 0.3 10*3/uL (ref 0.0–0.7)
Eosinophils Relative: 4.5 % (ref 0.0–5.0)
HCT: 28.8 % — ABNORMAL LOW (ref 36.0–46.0)
Hemoglobin: 9.5 g/dL — ABNORMAL LOW (ref 12.0–15.0)
Lymphocytes Relative: 30.7 % (ref 12.0–46.0)
Lymphs Abs: 2 10*3/uL (ref 0.7–4.0)
MCHC: 32.8 g/dL (ref 30.0–36.0)
MCV: 98.2 fl (ref 78.0–100.0)
Monocytes Absolute: 0.8 10*3/uL (ref 0.1–1.0)
Monocytes Relative: 12.6 % — ABNORMAL HIGH (ref 3.0–12.0)
Neutro Abs: 3.3 10*3/uL (ref 1.4–7.7)
Neutrophils Relative %: 51.2 % (ref 43.0–77.0)
Platelets: 244 10*3/uL (ref 150.0–400.0)
RBC: 2.93 Mil/uL — ABNORMAL LOW (ref 3.87–5.11)
RDW: 15.2 % (ref 11.5–15.5)
WBC: 6.5 10*3/uL (ref 4.0–10.5)

## 2020-01-24 MED ORDER — SUPREP BOWEL PREP KIT 17.5-3.13-1.6 GM/177ML PO SOLN: 1.0000 | Freq: Once | ORAL | 0 refills | Status: AC

## 2020-01-24 NOTE — Progress Notes (Signed)
No egg or soy allergy known to patient  issues with past sedation with any surgeries or procedures- hard to wake post op  No intubation problems in the past  No FH of Malignant Hyperthermia No diet pills per patient No home 02 use per patient  On Eliquis  blood thinners per patient - last dose Monday 1-3 and stop until after procedure - 3 day hold per Cardio  Pt denies issues with constipation  No A fib or A flutter  EMMI video to pt or via [place] 19 guidelines implemented in PV today with Pt and RN  Pt is fully vaccinated  for Covid    Pt informed she does not have to quarantine from dialysis after covid test   Due to the COVID-19 pandemic we are asking patients to follow certain guidelines.  Pt aware of COVID protocols and [facility] guidelines   Pt verified name, DOB, address and insurance during PV today. Pt mailed instruction packet to included paper to complete and mail back to [facility] with addressed and stamped envelope, Emmi video, copy of consent form to read and not return, and instructions. PV completed over the phone. Pt encouraged to call with questions or issues   Emailed instructions to son [name]- [email or web address]- he will print out instructions for his mother - [name] states he received instructions in his email this pm- he will print off for his mom today

## 2020-01-30 ENCOUNTER — Other Ambulatory Visit (HOSPITAL_COMMUNITY): Payer: Medicare HMO

## 2020-01-30 ENCOUNTER — Other Ambulatory Visit (HOSPITAL_COMMUNITY)
Admission: RE | Admit: 2020-01-30 | Discharge: 2020-01-30 | Disposition: A | Discharge status: Home / Self Care | Payer: Medicare HMO | Source: Ambulatory Visit | Attending: Gastroenterology | Admitting: Gastroenterology

## 2020-01-30 DIAGNOSIS — U071 COVID-19: Secondary | ICD-10-CM | POA: Diagnosis not present

## 2020-01-30 DIAGNOSIS — Z01812 Encounter for preprocedural laboratory examination: Secondary | ICD-10-CM | POA: Diagnosis present

## 2020-01-30 HISTORY — DX: COVID-19: U07.1

## 2020-01-30 LAB — SARS CORONAVIRUS 2 (TAT 6-24 HRS): SARS Coronavirus 2: POSITIVE — AB

## 2020-01-30 MED FILL — ETHYL CHLORIDE SPRAY: 1 days supply | Qty: 116 | Fill #1

## 2020-01-31 ENCOUNTER — Telehealth: Payer: Self-pay | Admitting: Infectious Diseases

## 2020-01-31 ENCOUNTER — Telehealth: Payer: Self-pay | Admitting: Gastroenterology

## 2020-01-31 NOTE — Telephone Encounter (Signed)
Inbound  Call from [place] at covid testing site stating patient's results came back and she tested positive for covid.  Is requesting a call back please.

## 2020-01-31 NOTE — Progress Notes (Signed)
Patient tested positive for Covid-19. Message left with surgeon's office    Update: @1020  call returned, nurse from Dr. Payton Emerald office notified.

## 2020-01-31 NOTE — Telephone Encounter (Signed)
Thanks for the update. Will try to work her in as quickly as possible in the future.

## 2020-01-31 NOTE — Telephone Encounter (Signed)
Called to discuss with patient about COVID-19 symptoms and the use of one of the available treatments for those with mild to moderate Covid symptoms and at a high risk of hospitalization.  Pt appears to qualify for outpatient treatment due to co-morbid conditions and/or a member of an at-risk group in accordance with the FDA Emergency Use Authorization.    Symptom onset: Uncertain  Vaccinated: not documented  Qualifiers: multiple risk factors with Tier 1 on NIH algorhithm   Unable to reach pt - LVM and sent [place] message to assess for monoclonal Ab infusion   [name]  01/31/2020 2:03 PM

## 2020-01-31 NOTE — Telephone Encounter (Signed)
Spoke with pt and notified her that she tested positive for covid. Pts procedure at the hospital cancelled. Instructed pt to contact her PCP regarding her positive covid test to get PCP recommendations. Pt knows to call back when she is better to reschedule procedure. Dr. Tarri Glenn notified.

## 2020-02-02 ENCOUNTER — Encounter (HOSPITAL_COMMUNITY): Admission: RE | Payer: Self-pay | Source: Home / Self Care

## 2020-02-02 ENCOUNTER — Ambulatory Visit (HOSPITAL_COMMUNITY): Admission: RE | Admit: 2020-02-02 | Payer: Medicare HMO | Source: Home / Self Care | Admitting: Gastroenterology

## 2020-02-02 SURGERY — ESOPHAGOGASTRODUODENOSCOPY (EGD) WITH PROPOFOL
Anesthesia: Monitor Anesthesia Care

## 2020-02-07 ENCOUNTER — Telehealth: Payer: Self-pay | Admitting: Nurse Practitioner

## 2020-02-07 NOTE — Telephone Encounter (Signed)
Pt's daughter is requesting a call back from a nurse to discuss the pt's symptoms which is diarrhea and vomiting, pt's symptoms are getting worse and cannot keep anything down.

## 2020-02-07 NOTE — Telephone Encounter (Addendum)
[name] Is the nausea and vomiting related to her COVID? She has Phenergan. She is on PPI BID. Gastroparesis hx. She is having about 4 or more diarrhea stools. "Everything goes in one end and out the other." Fluid restriction of 2 liters per day, but daughter does not think she has met the limit. She has taken 2 or 3 Imodium today.  Family is considering the ED. Daughter understands due to the lateness of the call, I may not get any recommendations back from the provider tonight. For tonight she will use the Phenergan if her mother begins vomiting. She has not vomited today. She will take Imodium again with the next diarrhea stool, but no more than 42m in 24 hours. She will change from Gatorade to Pedialyte and get more hydration without exceeding the 2 liter per day limit.

## 2020-02-09 NOTE — Telephone Encounter (Signed)
Unfortunately her procedures had to be cancelled due to testing positive for COVID. Until we can get procedures done to help figure things out she will need to treat symptoms with anti-diarrheal agents, anti-emetics, etc. If can't keep enough fluid done then will have to go to ED I am happy to refill anti-emetics Pedialyte is good. She did have heme positive stools so if getting weak then go to ED. Thanks

## 2020-02-14 ENCOUNTER — Other Ambulatory Visit (HOSPITAL_COMMUNITY): Payer: Self-pay | Admitting: Internal Medicine

## 2020-02-14 ENCOUNTER — Telehealth: Payer: Self-pay

## 2020-02-14 ENCOUNTER — Other Ambulatory Visit: Payer: Self-pay

## 2020-02-14 ENCOUNTER — Ambulatory Visit (HOSPITAL_COMMUNITY)
Admission: RE | Admit: 2020-02-14 | Discharge: 2020-02-14 | Disposition: A | Discharge status: Home / Self Care | Payer: Medicare HMO | Source: Ambulatory Visit | Attending: Internal Medicine | Admitting: Internal Medicine

## 2020-02-14 ENCOUNTER — Other Ambulatory Visit: Payer: Self-pay | Admitting: Radiology

## 2020-02-14 DIAGNOSIS — Z8616 Personal history of COVID-19: Secondary | ICD-10-CM | POA: Diagnosis not present

## 2020-02-14 DIAGNOSIS — N186 End stage renal disease: Secondary | ICD-10-CM | POA: Insufficient documentation

## 2020-02-14 DIAGNOSIS — G4733 Obstructive sleep apnea (adult) (pediatric): Secondary | ICD-10-CM | POA: Insufficient documentation

## 2020-02-14 DIAGNOSIS — Z7901 Long term (current) use of anticoagulants: Secondary | ICD-10-CM | POA: Insufficient documentation

## 2020-02-14 DIAGNOSIS — E1122 Type 2 diabetes mellitus with diabetic chronic kidney disease: Secondary | ICD-10-CM | POA: Diagnosis not present

## 2020-02-14 DIAGNOSIS — Z992 Dependence on renal dialysis: Secondary | ICD-10-CM | POA: Insufficient documentation

## 2020-02-14 DIAGNOSIS — I132 Hypertensive heart and chronic kidney disease with heart failure and with stage 5 chronic kidney disease, or end stage renal disease: Secondary | ICD-10-CM | POA: Diagnosis not present

## 2020-02-14 DIAGNOSIS — Z79899 Other long term (current) drug therapy: Secondary | ICD-10-CM | POA: Insufficient documentation

## 2020-02-14 DIAGNOSIS — R110 Nausea: Secondary | ICD-10-CM

## 2020-02-14 DIAGNOSIS — I4891 Unspecified atrial fibrillation: Secondary | ICD-10-CM | POA: Diagnosis not present

## 2020-02-14 DIAGNOSIS — Y841 Kidney dialysis as the cause of abnormal reaction of the patient, or of later complication, without mention of misadventure at the time of the procedure: Secondary | ICD-10-CM | POA: Insufficient documentation

## 2020-02-14 DIAGNOSIS — R634 Abnormal weight loss: Secondary | ICD-10-CM

## 2020-02-14 DIAGNOSIS — Z79890 Hormone replacement therapy (postmenopausal): Secondary | ICD-10-CM | POA: Insufficient documentation

## 2020-02-14 DIAGNOSIS — Z794 Long term (current) use of insulin: Secondary | ICD-10-CM | POA: Insufficient documentation

## 2020-02-14 DIAGNOSIS — I5032 Chronic diastolic (congestive) heart failure: Secondary | ICD-10-CM | POA: Insufficient documentation

## 2020-02-14 DIAGNOSIS — T82868A Thrombosis of vascular prosthetic devices, implants and grafts, initial encounter: Secondary | ICD-10-CM | POA: Diagnosis present

## 2020-02-14 DIAGNOSIS — I2510 Atherosclerotic heart disease of native coronary artery without angina pectoris: Secondary | ICD-10-CM | POA: Diagnosis not present

## 2020-02-14 HISTORY — PX: IR THROMBECTOMY AV FISTULA W/THROMBOLYSIS/PTA INC/SHUNT/IMG LEFT: IMG6106

## 2020-02-14 HISTORY — PX: IR US GUIDE VASC ACCESS LEFT: IMG2389

## 2020-02-14 LAB — GLUCOSE, CAPILLARY: Glucose-Capillary: 115 mg/dL — ABNORMAL HIGH (ref 70–99)

## 2020-02-14 MED ORDER — LIDOCAINE HCL 1 % IJ SOLN
INTRAMUSCULAR | Status: AC | PRN
  Administered 2020-02-14: 20 mL via INTRADERMAL

## 2020-02-14 MED ORDER — FENTANYL CITRATE (PF) 100 MCG/2ML IJ SOLN
INTRAMUSCULAR | Status: AC
  Filled 2020-02-14: qty 2

## 2020-02-14 MED ORDER — HEPARIN SODIUM (PORCINE) 1000 UNIT/ML IJ SOLN
INTRAMUSCULAR | Status: AC
  Filled 2020-02-14: qty 1

## 2020-02-14 MED ORDER — LIDOCAINE HCL 1 % IJ SOLN
INTRAMUSCULAR | Status: AC
  Filled 2020-02-14: qty 20

## 2020-02-14 MED ORDER — FENTANYL CITRATE (PF) 100 MCG/2ML IJ SOLN
INTRAMUSCULAR | Status: AC | PRN
  Administered 2020-02-14: 25 ug via INTRAVENOUS
  Administered 2020-02-14: 50 ug via INTRAVENOUS
  Administered 2020-02-14: 25 ug via INTRAVENOUS

## 2020-02-14 MED ORDER — MIDAZOLAM HCL 2 MG/2ML IJ SOLN
INTRAMUSCULAR | Status: AC | PRN
Start: 2020-02-14 — End: 2020-02-14
  Administered 2020-02-14: 1 mg via INTRAVENOUS
  Administered 2020-02-14 (×2): 0.5 mg via INTRAVENOUS

## 2020-02-14 MED ORDER — MIDAZOLAM HCL 2 MG/2ML IJ SOLN
INTRAMUSCULAR | Status: AC
  Filled 2020-02-14: qty 2

## 2020-02-14 MED ORDER — SODIUM CHLORIDE 0.9 % IV SOLN: INTRAVENOUS | Status: DC

## 2020-02-14 MED ORDER — HEPARIN SODIUM (PORCINE) 1000 UNIT/ML IJ SOLN
INTRAMUSCULAR | Status: AC | PRN
  Administered 2020-02-14: 6000 [IU] via INTRAVENOUS

## 2020-02-14 MED ORDER — IOHEXOL 300 MG/ML  SOLN
100.0000 mL | Freq: Once | INTRAMUSCULAR | Status: AC | PRN
  Administered 2020-02-14: 30 mL

## 2020-02-14 MED ORDER — ALTEPLASE 2 MG IJ SOLR
INTRAMUSCULAR | Status: AC | PRN
  Administered 2020-02-14: 4 mg

## 2020-02-14 NOTE — Telephone Encounter (Signed)
Spoke with Heinz Knuckles, the daughter and care giver. The patient is at IR today. Her port for dialysis stopped working. Briefly mentioned the dates and plan for her mother. Plan to speak tomorrow.

## 2020-02-14 NOTE — Discharge Instructions (Addendum)
Stoelting's Anesthesia and Co-Existing Disease (7th ed., pp. [phone]). Elsevier."> Signa Kell and Rector's The Kidney (10th ed., pp. 2191-2225.e6). Elsevier.">  Dialysis Vascular Access Malfunction        Dialysis vascular access is an opening into your blood vessels that can be used for dialysis treatments. Dialysis is a treatment used for kidney failure. A health care provider can make vascular access in many ways, such as by:  Joining an artery to a vein in order to make a bigger blood vessel called a fistula.  Joining an artery to a vein using a soft tube called a graft.  Placing a thin, flexible tube (catheter) in a large vein, usually in your neck, chest, or groin. A vascular access can malfunction. This means that it can become blocked or stop working correctly. What are the causes? Malfunctioning of a vascular access may be caused by:  Infection. This is the most common cause of malfunction.  A blood clot inside a part of the fistula, graft, or catheter. A blood clot can completely or partially block the flow of blood.  A kink in the graft or catheter.  A collection of blood (hematoma or bruise) next to the graft or catheter that pushes against it, blocking the flow of blood. What are the signs or symptoms? Symptoms of this condition include:  A change in the vibration or pulse (thrill) of your fistula or graft.  Feeling no thrill in the fistula or graft.  New or unusual swelling of the area around the access.  An unsuccessful puncture of your access by the dialysis team.  Slow flow of blood through the fistula, graft, or catheter. Dialysis will not work properly if this happens.  Bleeding that cannot be easily stopped when the needle is removed after dialysis.  Signs of infection, such as: ? Pain, swelling, redness, red streaks, or numbness. ? Blood or pus coming from the access. How is this diagnosed? This condition may be diagnosed with:  Blood tests or blood  cultures.  An X-ray that uses dye, or contrast, to show areas that have a blockage. How is this treated? Treatment for this condition depends on the underlying cause of the malfunction.  If the vascular access is infected, your health care provider may prescribe antibiotic medicine to control the infection.  If a clot is found in the vascular access, you may need surgery to remove the clot. Blood thinners may also be used.  If a blockage in the vascular access is caused by something else, such as a kink in a graft, you will likely need surgery to unblock or replace the graft.  If there is a malfunction for any reason, your health care provider may remove and replace your access. Follow these instructions at home: Medicines  Take over-the-counter and prescription medicines only as told by your health care provider.  If you were prescribed an antibiotic medicine, use it as told by your health care provider. Do not stop using the antibiotic even if you start to feel better. Activity  Try not to bump the area that has the access device.  Do not lift anything that is heavier than 10 lb (4.5 kg), or the limit that you are told, until your health care provider says that it is safe. Lifting heavy objects can put too much pressure on your access.  Do exercises as told by your health care provider. Certain exercises may be limited based on where the access site is located.  Return to your normal  activities as told by your health care provider. Ask your health care provider what activities are safe for you. Lifestyle  Do not wear jewelry or tight clothing around the access.  Do not sleep with the access arm placed under your head or body.  Avoid pulling or tugging on the access if you have a central line catheter. General instructions  Wash your hands with soap and water for at least 20 seconds before and after touching the area around the vascular access. If soap and water are not  available, use hand sanitizer.  Keep all follow-up visits. This is very important. Any delay in follow-up could cause permanent malfunction of the vascular access. Contact a health care provider if:  Swelling around the vascular access gets worse.  You develop new pain.  You have pus or other fluid (drainage) at the vascular access site.  You have chills or a fever. Get help right away if:  You have bleeding at the vascular access that cannot be easily controlled.  You have pain, numbness, an unusual pale skin color, or blue fingers or sores at the tips of the fingers of the fistula hand.  You develop redness or red streaking on the skin around, above, or below the vascular access.  Your access is hot, swollen, red, and very painful.  You can suddenly see the cuff of the catheter used in the access. These symptoms may represent a serious problem that is an emergency. Do not wait to see if the symptoms will go away. Get medical help right away. Call your local emergency services (911 in the [place] Do not drive yourself to the hospital. Summary  Dialysis vascular access is an opening into your blood vessels that can be used for dialysis treatments.  Your vascular access can become blocked or stop working correctly (malfunction) for several reasons.  Treatment varies depending on the cause of the malfunction.  Keep all follow-up visits. Any delay in follow-up could cause permanent malfunction of the vascular access. This information is not intended to replace advice given to you by your health care provider. Make sure you discuss any questions you have with your health care provider. Document Revised: 08/23/2019 Document Reviewed: 08/23/2019 Elsevier Patient Education  2021 [place]. Moderate Conscious Sedation, Adult Sedation is the use of medicines to promote relaxation and to relieve discomfort and anxiety. Moderate conscious sedation is a type of sedation. Under moderate  conscious sedation, you are less alert than normal, but you are still able to respond to instructions, touch, or both. Moderate conscious sedation is used during short medical and dental procedures. It is milder than deep sedation, which is a type of sedation under which you cannot be easily woken up. It is also milder than general anesthesia, which is the use of medicines to make you unconscious. Moderate conscious sedation allows you to return to your regular activities sooner. Tell a health care provider about:  Any allergies you have.  All medicines you are taking, including vitamins, herbs, eye drops, creams, and over-the-counter medicines.  Any use of steroids. This includes steroids taken by mouth or as a cream.  Any problems you or family members have had with sedatives and anesthetic medicines.  Any blood disorders you have.  Any surgeries you have had.  Any medical conditions you have, such as sleep apnea.  Whether you are pregnant or may be pregnant.  Any use of cigarettes, alcohol, marijuana, or drugs. What are the risks? Generally, this is a safe  procedure. However, problems may occur, including:  Getting too much medicine (oversedation).  Nausea.  Allergic reaction to medicines.  Trouble breathing. If this happens, a breathing tube may be used. It will be removed when you are awake and breathing on your own.  Heart trouble.  Lung trouble.  Confusion that gets better with time (emergence delirium). What happens before the procedure? Staying hydrated Follow instructions from your health care provider about hydration, which may include:  Up to 2 hours before the procedure - you may continue to drink clear liquids, such as water, clear fruit juice, black coffee, and plain tea. Eating and drinking restrictions Follow instructions from your health care provider about eating and drinking, which may include:  8 hours before the procedure - stop eating heavy meals or  foods, such as meat, fried foods, or fatty foods.  6 hours before the procedure - stop eating light meals or foods, such as toast or cereal.  6 hours before the procedure - stop drinking milk or drinks that contain milk.  2 hours before the procedure - stop drinking clear liquids. Medicines Ask your health care provider about:  Changing or stopping your regular medicines. This is especially important if you are taking diabetes medicines or blood thinners.  Taking medicines such as aspirin and ibuprofen. These medicines can thin your blood. Do not take these medicines unless your health care provider tells you to take them.  Taking over-the-counter medicines, vitamins, herbs, and supplements. Tests and exams  You will have a physical exam.  You may have blood tests done to show how well: ? Your kidneys and liver work. ? Your blood clots. General instructions  Plan to have a responsible adult take you home from the hospital or clinic.  If you will be going home right after the procedure, plan to have a responsible adult care for you for the time you are told. This is important. What happens during the procedure?  You will be given the sedative. The sedative may be given: ? As a pill that you will swallow. It can also be inserted into the rectum. ? As a spray through the nose. ? As an injection into the muscle. ? As an injection into the vein through an IV.  You may be given oxygen as needed.  Your breathing, heart rate, and blood pressure will be monitored during the procedure.  The medical or dental procedure will be done. The procedure may vary among health care providers and hospitals.   What happens after the procedure?  Your blood pressure, heart rate, breathing rate, and blood oxygen level will be monitored until you leave the hospital or clinic.  You will get fluids through your IV if needed.  Do not drive or operate machinery until your health care provider says  that it is safe. Summary  Sedation is the use of medicines to promote relaxation and to relieve discomfort and anxiety. Moderate conscious sedation is a type of sedation that is used during short medical and dental procedures.  Tell the health care provider about any medical conditions that you have and about all the medicines that you are taking.  You will be given the sedative as a pill, a spray through the nose, an injection into the muscle, or an injection into the vein through an IV. Vital signs are monitored during the sedation.  Moderate conscious sedation allows you to return to your regular activities sooner. This information is not intended to replace advice given to  you by your health care provider. Make sure you discuss any questions you have with your health care provider. Document Revised: 05/12/2019 Document Reviewed: 12/08/2018 Elsevier Patient Education  2021 [employer].
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
## 2020-02-14 NOTE — Telephone Encounter (Signed)
-----   Message from Ladene Artist, MD sent at 02/13/2020 10:16 PM EST ----- Regarding: RE: I agree. Office reevaluation with PG to reassess her health status and fitness for procedures is appropriate. OK to schedule her procedures now and if she is not medically fit at [facility] appointment then cancel procedures. MS ----- Message ----- From: Willia Craze, NP Sent: 02/13/2020   4:23 PM EST To: Ladene Artist, MD, Greggory Keen, LPN, #  Okay. I haven't seen this patient since early December so I don't know what her health status is at this point. I will put her in with Fuller Plan on one of the Feb days he mentioned if [facility] can accommodate and. I will try to work her back in to see me beforehand to make sure she is still stable to undergo procedures. Thanks Beth, please see if I have a time to see patient in clinic before 2/15 or 2/16. If so then please give her that appt and go ahead and put procedures in with Fuller Plan for one of those days. If I see her in clinic and she doesn't seem stable then I can cancel procedures. She will need to be off Eliquis for the procedures. Thanks    ----- Message ----- From: [name], MD Sent: 02/13/2020   3:15 PM EST To: Ladene Artist, MD, Willia Craze, NP, #  I had overbooked her at a 7:30 appointment because of Paula's concerns.  I am happy to do the procedures if I have availability if/when her pulmonary status allows anesthesia and endoscopy as she recovers from Covid.   KLB ----- Message ----- From: Ladene Artist, MD Sent: 02/13/2020   3:06 PM EST To: Willia Craze, NP, Greggory Keen, LPN, #  My outpatient block on 1/17 was cancelled due to weather. My next 2 outpatient procedures blocks on 3/3 and 3/7 are currently full. I could do this colon/EGD during my yellow block hospital week on Tues 2/15 or Wed 2/16 if [facility] has availability. If [facility] or anyone else has availability I'm happy for them to schedule and proceed.   MS ----- Message  ----- From: Willia Craze, NP Sent: 02/13/2020   2:56 PM EST To: Ladene Artist, MD, Greggory Keen, LPN, #  I saw this new patient early December for nausea / vomiting, heme positive black stool on Eliquis and weight loss of 19 pounds since August.  Fuller Plan was supervising. She needed a double at the hospital, Fuller Plan had no openings, [facility] offered to help and scheduled patient for procedures at hospital . Daughter upset that procedures were too far away so Joelene Millin found a sooner date to accommodate patient. She was scheduled for 02/02/20 but pre-procedure COVID test was positive so procedures cancelled.    Hgb has been stable, I can repeat one soon but she is still in need of procedures.   Thanks,  PG

## 2020-02-14 NOTE — Procedures (Signed)
Pre procedural Dx: ESRD Post procedural Dx: Same.   Technically successful declot of left thigh dialysis graft.   Access is ready for immediate use.  EBL: Trace Complications: None immediate   Ronny Bacon, MD Pager #: [phone]

## 2020-02-15 NOTE — Telephone Encounter (Signed)
[employer]. No answer. Left message to call back to discuss the plan.

## 2020-02-16 NOTE — Telephone Encounter (Signed)
Spoke with [name]. Discussed the new plan. She agrees to this and will bring the patient to the appointment with Tye Savoy, NP on 02/28/20 at 1:30pm. She agrees to the appointment for the EGD/colonoscopy at [facility] on 03/12/20 with a planned arrival of 11:00 am. Agrees that the new instructions will be reviewed at 02/28/20 appointment if it is determined that she can have the procedures.

## 2020-02-27 MED FILL — ETHYL CHLORIDE SPRAY: 1 days supply | Qty: 116 | Fill #2

## 2020-02-28 ENCOUNTER — Other Ambulatory Visit (HOSPITAL_COMMUNITY): Payer: Self-pay | Admitting: Nurse Practitioner

## 2020-02-28 ENCOUNTER — Ambulatory Visit: Payer: Medicare HMO | Admitting: Nurse Practitioner

## 2020-02-29 ENCOUNTER — Telehealth: Payer: Self-pay

## 2020-02-29 NOTE — Telephone Encounter (Signed)
-----   Message from Willia Craze, NP sent at 02/29/2020  8:33 AM EST ----- [name], please see Dr. Lynne Leader answer to my question and call patient. Thank you ----- Message ----- From: Ladene Artist, MD Sent: 02/28/2020   2:05 PM EST To: Willia Craze, NP  Please call her, or have your nurse call her, to assess for any significant cardiopulmonary symptoms, her overall status and if she is going to proceed with colonoscopy and EGD on 2/15 with pre-procedure Covid-19 testing as required.   ----- Message ----- From: Willia Craze, NP Sent: 02/28/2020   1:57 PM EST To: Ladene Artist, MD  FYI, this patient is scheduled with you for an ECL at [facility] Feb 15th. She was to come in for a quick check to make sure still medically stable for procedure but did not show for appt today. I guess if you feel comfortable proceeding with procedures then okay, otherwise we can cancel them, see her in clinic and reschedule procedures. However, we have been trying to get them done as patient is anemic, heme positive on blood thinners Thanks,  Pg

## 2020-02-29 NOTE — Telephone Encounter (Signed)
Called the daughter. No answer. Left her a message to call us back.

## 2020-03-05 NOTE — Telephone Encounter (Signed)
Thanks . Ok then will plan to proceed with procedures.

## 2020-03-05 NOTE — Telephone Encounter (Signed)
Called care giver, the daughter. She states her mother is fully recovered from [place]. States the entire family had it but they are all well again. Per the dialysis center, the patient's BP has been stable. She has not had to go to the ED for any urgent issues. No recent medication changes.  Reminded of the COVID screening 03/08/20. She will review the instructions to be certain she understands. EGD/colonoscopy 03/12/20 as planned.

## 2020-03-06 ENCOUNTER — Other Ambulatory Visit: Payer: Self-pay

## 2020-03-06 ENCOUNTER — Encounter (HOSPITAL_COMMUNITY): Payer: Self-pay | Admitting: Gastroenterology

## 2020-03-08 ENCOUNTER — Other Ambulatory Visit (HOSPITAL_COMMUNITY): Payer: Medicare HMO

## 2020-03-11 ENCOUNTER — Telehealth: Payer: Self-pay | Admitting: Nurse Practitioner

## 2020-03-11 NOTE — Telephone Encounter (Signed)
Called daughter back. No answer. Left a message of my return call on her voicemail.

## 2020-03-12 ENCOUNTER — Ambulatory Visit (HOSPITAL_COMMUNITY)
Admission: RE | Admit: 2020-03-12 | Discharge: 2020-03-12 | Disposition: A | Discharge status: Home / Self Care | Payer: Medicare HMO | Attending: Gastroenterology | Admitting: Gastroenterology

## 2020-03-12 ENCOUNTER — Encounter (HOSPITAL_COMMUNITY)
Admission: RE | Disposition: A | Discharge status: Home / Self Care | Payer: Self-pay | Source: Home / Self Care | Attending: Gastroenterology

## 2020-03-12 ENCOUNTER — Ambulatory Visit (HOSPITAL_COMMUNITY): Payer: Medicare HMO | Admitting: Anesthesiology

## 2020-03-12 ENCOUNTER — Encounter (HOSPITAL_COMMUNITY): Payer: Self-pay | Admitting: Gastroenterology

## 2020-03-12 ENCOUNTER — Other Ambulatory Visit: Payer: Self-pay

## 2020-03-12 DIAGNOSIS — D124 Benign neoplasm of descending colon: Secondary | ICD-10-CM

## 2020-03-12 DIAGNOSIS — Z8379 Family history of other diseases of the digestive system: Secondary | ICD-10-CM | POA: Insufficient documentation

## 2020-03-12 DIAGNOSIS — Z7901 Long term (current) use of anticoagulants: Secondary | ICD-10-CM | POA: Diagnosis not present

## 2020-03-12 DIAGNOSIS — C180 Malignant neoplasm of cecum: Secondary | ICD-10-CM

## 2020-03-12 DIAGNOSIS — Z885 Allergy status to narcotic agent status: Secondary | ICD-10-CM | POA: Insufficient documentation

## 2020-03-12 DIAGNOSIS — Z905 Acquired absence of kidney: Secondary | ICD-10-CM | POA: Diagnosis not present

## 2020-03-12 DIAGNOSIS — I132 Hypertensive heart and chronic kidney disease with heart failure and with stage 5 chronic kidney disease, or end stage renal disease: Secondary | ICD-10-CM | POA: Diagnosis not present

## 2020-03-12 DIAGNOSIS — R110 Nausea: Secondary | ICD-10-CM

## 2020-03-12 DIAGNOSIS — Z8041 Family history of malignant neoplasm of ovary: Secondary | ICD-10-CM | POA: Insufficient documentation

## 2020-03-12 DIAGNOSIS — K633 Ulcer of intestine: Secondary | ICD-10-CM

## 2020-03-12 DIAGNOSIS — Z8249 Family history of ischemic heart disease and other diseases of the circulatory system: Secondary | ICD-10-CM | POA: Diagnosis not present

## 2020-03-12 DIAGNOSIS — K921 Melena: Secondary | ICD-10-CM

## 2020-03-12 DIAGNOSIS — I4891 Unspecified atrial fibrillation: Secondary | ICD-10-CM | POA: Diagnosis not present

## 2020-03-12 DIAGNOSIS — D120 Benign neoplasm of cecum: Secondary | ICD-10-CM | POA: Insufficient documentation

## 2020-03-12 DIAGNOSIS — Z79890 Hormone replacement therapy (postmenopausal): Secondary | ICD-10-CM | POA: Insufficient documentation

## 2020-03-12 DIAGNOSIS — I2510 Atherosclerotic heart disease of native coronary artery without angina pectoris: Secondary | ICD-10-CM | POA: Diagnosis not present

## 2020-03-12 DIAGNOSIS — I252 Old myocardial infarction: Secondary | ICD-10-CM | POA: Insufficient documentation

## 2020-03-12 DIAGNOSIS — Z880 Allergy status to penicillin: Secondary | ICD-10-CM | POA: Insufficient documentation

## 2020-03-12 DIAGNOSIS — I5032 Chronic diastolic (congestive) heart failure: Secondary | ICD-10-CM | POA: Insufficient documentation

## 2020-03-12 DIAGNOSIS — Z884 Allergy status to anesthetic agent status: Secondary | ICD-10-CM | POA: Insufficient documentation

## 2020-03-12 DIAGNOSIS — Z800 Family history of malignant neoplasm of digestive organs: Secondary | ICD-10-CM | POA: Insufficient documentation

## 2020-03-12 DIAGNOSIS — Z794 Long term (current) use of insulin: Secondary | ICD-10-CM | POA: Insufficient documentation

## 2020-03-12 DIAGNOSIS — G4733 Obstructive sleep apnea (adult) (pediatric): Secondary | ICD-10-CM | POA: Diagnosis not present

## 2020-03-12 DIAGNOSIS — R634 Abnormal weight loss: Secondary | ICD-10-CM

## 2020-03-12 DIAGNOSIS — K2970 Gastritis, unspecified, without bleeding: Secondary | ICD-10-CM | POA: Insufficient documentation

## 2020-03-12 DIAGNOSIS — N186 End stage renal disease: Secondary | ICD-10-CM | POA: Diagnosis not present

## 2020-03-12 DIAGNOSIS — Z833 Family history of diabetes mellitus: Secondary | ICD-10-CM | POA: Insufficient documentation

## 2020-03-12 DIAGNOSIS — Z888 Allergy status to other drugs, medicaments and biological substances status: Secondary | ICD-10-CM | POA: Insufficient documentation

## 2020-03-12 DIAGNOSIS — Z86010 Personal history of colonic polyps: Secondary | ICD-10-CM | POA: Diagnosis not present

## 2020-03-12 DIAGNOSIS — D122 Benign neoplasm of ascending colon: Secondary | ICD-10-CM

## 2020-03-12 DIAGNOSIS — D123 Benign neoplasm of transverse colon: Secondary | ICD-10-CM | POA: Diagnosis not present

## 2020-03-12 DIAGNOSIS — Z79899 Other long term (current) drug therapy: Secondary | ICD-10-CM | POA: Insufficient documentation

## 2020-03-12 DIAGNOSIS — K6389 Other specified diseases of intestine: Secondary | ICD-10-CM

## 2020-03-12 DIAGNOSIS — R112 Nausea with vomiting, unspecified: Secondary | ICD-10-CM | POA: Diagnosis present

## 2020-03-12 DIAGNOSIS — Z9981 Dependence on supplemental oxygen: Secondary | ICD-10-CM | POA: Diagnosis not present

## 2020-03-12 DIAGNOSIS — Z992 Dependence on renal dialysis: Secondary | ICD-10-CM | POA: Diagnosis not present

## 2020-03-12 DIAGNOSIS — R195 Other fecal abnormalities: Secondary | ICD-10-CM

## 2020-03-12 DIAGNOSIS — Z881 Allergy status to other antibiotic agents status: Secondary | ICD-10-CM | POA: Insufficient documentation

## 2020-03-12 DIAGNOSIS — Z8052 Family history of malignant neoplasm of bladder: Secondary | ICD-10-CM | POA: Insufficient documentation

## 2020-03-12 DIAGNOSIS — Z91040 Latex allergy status: Secondary | ICD-10-CM | POA: Insufficient documentation

## 2020-03-12 DIAGNOSIS — Z85528 Personal history of other malignant neoplasm of kidney: Secondary | ICD-10-CM | POA: Insufficient documentation

## 2020-03-12 DIAGNOSIS — E1122 Type 2 diabetes mellitus with diabetic chronic kidney disease: Secondary | ICD-10-CM | POA: Insufficient documentation

## 2020-03-12 DIAGNOSIS — C186 Malignant neoplasm of descending colon: Secondary | ICD-10-CM | POA: Insufficient documentation

## 2020-03-12 DIAGNOSIS — Z6841 Body Mass Index (BMI) 40.0 and over, adult: Secondary | ICD-10-CM | POA: Insufficient documentation

## 2020-03-12 DIAGNOSIS — Z8051 Family history of malignant neoplasm of kidney: Secondary | ICD-10-CM | POA: Insufficient documentation

## 2020-03-12 HISTORY — PX: POLYPECTOMY: SHX5525

## 2020-03-12 HISTORY — PX: COLONOSCOPY WITH PROPOFOL: SHX5780

## 2020-03-12 HISTORY — PX: BIOPSY: SHX5522

## 2020-03-12 HISTORY — PX: SUBMUCOSAL TATTOO INJECTION: SHX6856

## 2020-03-12 HISTORY — PX: ESOPHAGOGASTRODUODENOSCOPY (EGD) WITH PROPOFOL: SHX5813

## 2020-03-12 HISTORY — DX: Malignant neoplasm of cecum: C18.0

## 2020-03-12 LAB — POCT I-STAT, CHEM 8
BUN: 26 mg/dL — ABNORMAL HIGH (ref 8–23)
Calcium, Ion: 1.07 mmol/L — ABNORMAL LOW (ref 1.15–1.40)
Chloride: 101 mmol/L (ref 98–111)
Creatinine, Ser: 5.9 mg/dL — ABNORMAL HIGH (ref 0.44–1.00)
Glucose, Bld: 94 mg/dL (ref 70–99)
HCT: 40 % (ref 36.0–46.0)
Hemoglobin: 13.6 g/dL (ref 12.0–15.0)
Potassium: 6.1 mmol/L — ABNORMAL HIGH (ref 3.5–5.1)
Sodium: 141 mmol/L (ref 135–145)
TCO2: 31 mmol/L (ref 22–32)

## 2020-03-12 LAB — BASIC METABOLIC PANEL
Anion gap: 15 (ref 5–15)
BUN: 19 mg/dL (ref 8–23)
CO2: 27 mmol/L (ref 22–32)
Calcium: 8.9 mg/dL (ref 8.9–10.3)
Chloride: 103 mmol/L (ref 98–111)
Creatinine, Ser: 5.7 mg/dL — ABNORMAL HIGH (ref 0.44–1.00)
GFR, Estimated: 7 mL/min — ABNORMAL LOW (ref >60)
Glucose, Bld: 94 mg/dL (ref 70–99)
Potassium: 4.3 mmol/L (ref 3.5–5.1)
Sodium: 145 mmol/L (ref 135–145)

## 2020-03-12 SURGERY — ESOPHAGOGASTRODUODENOSCOPY (EGD) WITH PROPOFOL
Anesthesia: Monitor Anesthesia Care

## 2020-03-12 MED ORDER — PROPOFOL 500 MG/50ML IV EMUL
INTRAVENOUS | Status: DC | PRN
  Administered 2020-03-12: 25 ug/kg/min via INTRAVENOUS

## 2020-03-12 MED ORDER — SODIUM CHLORIDE 0.9 % IV SOLN: INTRAVENOUS | Status: DC

## 2020-03-12 MED ORDER — LIDOCAINE 2% (20 MG/ML) 5 ML SYRINGE
INTRAMUSCULAR | Status: DC | PRN
  Administered 2020-03-12 (×2): 40 mg via INTRAVENOUS

## 2020-03-12 MED ORDER — SPOT INK MARKER SYRINGE KIT
PACK | SUBMUCOSAL | Status: AC
  Filled 2020-03-12: qty 5

## 2020-03-12 MED ORDER — PROPOFOL 10 MG/ML IV BOLUS
INTRAVENOUS | Status: AC
  Filled 2020-03-12: qty 20

## 2020-03-12 MED ORDER — EPHEDRINE SULFATE-NACL 50-0.9 MG/10ML-% IV SOSY
PREFILLED_SYRINGE | INTRAVENOUS | Status: DC | PRN
  Administered 2020-03-12: 10 mg via INTRAVENOUS

## 2020-03-12 MED ORDER — PHENYLEPHRINE 40 MCG/ML (10ML) SYRINGE FOR IV PUSH (FOR BLOOD PRESSURE SUPPORT)
PREFILLED_SYRINGE | INTRAVENOUS | Status: DC | PRN
  Administered 2020-03-12 (×3): 80 ug via INTRAVENOUS

## 2020-03-12 MED ORDER — PROPOFOL 500 MG/50ML IV EMUL
INTRAVENOUS | Status: DC | PRN
  Administered 2020-03-12 (×2): 20 mg via INTRAVENOUS
  Administered 2020-03-12: 50 mg via INTRAVENOUS
  Administered 2020-03-12: 30 mg via INTRAVENOUS
  Administered 2020-03-12: 20 mg via INTRAVENOUS

## 2020-03-12 MED ORDER — SPOT INK MARKER SYRINGE KIT
PACK | SUBMUCOSAL | Status: DC | PRN
  Administered 2020-03-12: 3 mL via SUBMUCOSAL

## 2020-03-12 MED ORDER — PROPOFOL 1000 MG/100ML IV EMUL
INTRAVENOUS | Status: AC
  Filled 2020-03-12: qty 200

## 2020-03-12 MED ORDER — PHENYLEPHRINE HCL-NACL 10-0.9 MG/250ML-% IV SOLN
INTRAVENOUS | Status: DC | PRN
  Administered 2020-03-12: 25 ug/min via INTRAVENOUS

## 2020-03-12 MED ORDER — GLYCOPYRROLATE PF 0.2 MG/ML IJ SOSY
PREFILLED_SYRINGE | INTRAMUSCULAR | Status: DC | PRN
  Administered 2020-03-12: .1 mg via INTRAVENOUS

## 2020-03-12 SURGICAL SUPPLY — 25 items

## 2020-03-12 NOTE — Anesthesia Procedure Notes (Signed)
Procedure Name: MAC Date/Time: 03/12/2020 2:05 PM Performed by: Michele Rockers, CRNA Pre-anesthesia Checklist: Patient identified, Emergency Drugs available, Suction available, Timeout performed and Patient being monitored Patient Re-evaluated:Patient Re-evaluated prior to induction Oxygen Delivery Method: Nasal Cannula

## 2020-03-12 NOTE — Transfer of Care (Signed)
Immediate Anesthesia Transfer of Care Note  Patient: NEYSHA CRIADO  Procedure(s) Performed: ESOPHAGOGASTRODUODENOSCOPY (EGD) WITH PROPOFOL (N/A ) COLONOSCOPY WITH PROPOFOL (N/A ) POLYPECTOMY SUBMUCOSAL TATTOO INJECTION BIOPSY  Patient Location: Endoscopy Unit  Anesthesia Type:MAC  Level of Consciousness: drowsy  Airway & Oxygen Therapy: Patient Spontanous Breathing and Patient connected to face mask oxygen  Post-op Assessment: Report given to RN and Post -op Vital signs reviewed and stable  Post vital signs: Reviewed and stable  Last Vitals:  Vitals Value Taken Time  BP 98/38 1510  Temp    Pulse 72   Resp    SpO2 100%     Last Pain:  Vitals:   03/12/20 1242  TempSrc: Oral  PainSc:          Complications: No complications documented.

## 2020-03-12 NOTE — Discharge Instructions (Signed)
YOU HAD AN ENDOSCOPIC PROCEDURE TODAY: Refer to the procedure report and other information in the discharge instructions given to you for any specific questions about what was found during the examination. If this information does not answer your questions, please call [place] office at [phone] to clarify.  ° °YOU SHOULD EXPECT: Some feelings of bloating in the abdomen. Passage of more gas than usual. Walking can help get rid of the air that was put into your GI tract during the procedure and reduce the bloating. If you had a lower endoscopy (such as a colonoscopy or flexible sigmoidoscopy) you may notice spotting of blood in your stool or on the toilet paper. Some abdominal soreness may be present for a day or two, also. ° °DIET: Your first meal following the procedure should be a light meal and then it is ok to progress to your normal diet. A half-sandwich or bowl of soup is an example of a good first meal. Heavy or fried foods are harder to digest and may make you feel nauseous or bloated. Drink plenty of fluids but you should avoid alcoholic beverages for 24 hours. If you had a esophageal dilation, please see attached instructions for diet.   ° °ACTIVITY: Your care partner should take you home directly after the procedure. You should plan to take it easy, moving slowly for the rest of the day. You can resume normal activity the day after the procedure however YOU SHOULD NOT DRIVE, use power tools, machinery or perform tasks that involve climbing or major physical exertion for 24 hours (because of the sedation medicines used during the test).  ° °SYMPTOMS TO REPORT IMMEDIATELY: °A gastroenterologist can be reached at any hour. Please call [phone]  for any of the following symptoms:  °Following lower endoscopy (colonoscopy, flexible sigmoidoscopy) °Excessive amounts of blood in the stool  °Significant tenderness, worsening of abdominal pains  °Swelling of the abdomen that is new, acute  °Fever of 100° or  higher  °Following upper endoscopy (EGD, EUS, ERCP, esophageal dilation) °Vomiting of blood or coffee ground material  °New, significant abdominal pain  °New, significant chest pain or pain under the shoulder blades  °Painful or persistently difficult swallowing  °New shortness of breath  °Black, tarry-looking or red, bloody stools ° °FOLLOW UP:  °If any biopsies were taken you will be contacted by phone or by letter within the next 1-3 weeks. Call [phone]  if you have not heard about the biopsies in 3 weeks.  °Please also call with any specific questions about appointments or follow up tests. ° °
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
## 2020-03-12 NOTE — Op Note (Signed)
[facility] Patient Name: [name] Procedure Date: 03/12/2020 MRN: [id number] Attending MD: Ladene Artist , MD Date of Birth: Nov 06, 1940 CSN: [id number] Age: 80 Admit Type: Outpatient Procedure:                Colonoscopy Indications:              Heme positive stool, Weight loss, History of                            adenomatous colon polyps. Providers:                Pricilla Riffle. Fuller Plan, MD, Cleda Daub, RN, Benetta Spar, Technician Referring MD:              Medicines:                Monitored Anesthesia Care Complications:            No immediate complications. Estimated blood loss:                            None. Estimated Blood Loss:     Estimated blood loss: none. Procedure:                Pre-Anesthesia Assessment:                           - Prior to the procedure, a History and Physical                            was performed, and patient medications and                            allergies were reviewed. The patient's tolerance of                            previous anesthesia was also reviewed. The risks                            and benefits of the procedure and the sedation                            options and risks were discussed with the patient.                            All questions were answered, and informed consent                            was obtained. Prior Anticoagulants: The patient has                            taken Eliquis (apixaban), last dose was 3 days                            prior to procedure.  ASA Grade Assessment: III - A                            patient with severe systemic disease. After                            reviewing the risks and benefits, the patient was                            deemed in satisfactory condition to undergo the                            procedure.                           After obtaining informed consent, the colonoscope                            was  passed under direct vision. Throughout the                            procedure, the patient's blood pressure, pulse, and                            oxygen saturations were monitored continuously. The                            CF-HQ190L ([id number] Olympus colonoscope was                            introduced through the anus and advanced to the the                            cecum, identified by appendiceal orifice and                            ileocecal valve. The ileocecal valve, appendiceal                            orifice, and rectum were photographed. The quality                            of the bowel preparation was good. The colonoscopy                            was performed without difficulty. The patient                            tolerated the procedure well. Scope In: 2:07:46 PM Scope Out: 2:47:03 PM Scope Withdrawal Time: 0 hours 36 minutes 14 seconds  Total Procedure Duration: 0 hours 39 minutes 17 seconds  Findings:      The perianal and digital rectal examinations were normal.      Six sessile polyps were found in the descending colon (2), transverse  colon, ascending colon, cecum and IC valve. The polyps were 7 to 9 mm in       size. These polyps were removed with a cold snare. Mild oozing at all       polypectomy sites that stopped spontaneously. Resection and retrieval       were complete.      An ulcerated non-obstructing medium-sized mass was found in the cecum.       The mass was non-circumferential. The mass measured 12 mm in length. In       addition, its diameter measured 12 mm. Biopsies were taken with a cold       forceps for histology.      A 15 mm polyp was found in the transverse colon. The polyp was sessile.       The polyp was removed with a hot snare. Resection and retrieval were       complete.      The exam was otherwise without abnormality on direct and retroflexion       views. Impression:               Six polyps in the descending colon,  transverse                            colon, ascending colon, cecum and IC valve. Removed                            by cold snare and retrieved.                           One 12 mm ulcerated mass in the cecum. Forceps                            biopsies obtained.                           One 15 mm polyp in the transverse colon. Removed by                            hot snare and retrieved.                           One 40 mm polyp in the descending colon. Removed by                            piecemeal polypectomy and retrieved. Tattoos placed                            proximal to polypectomy site.                           Otherwise normal appearing colonoscopy. Moderate Sedation:      Not Applicable - Patient had care per Anesthesia. Recommendation:           - Repeat colonoscopy after studies are complete for                            surveillance based on pathology results.                           -  Resume Eliquis (apixaban) in 5 days at prior                            dose. Refer to managing physician for further                            adjustment of therapy.                           - Patient has a contact number available for                            emergencies. The signs and symptoms of potential                            delayed complications were discussed with the                            patient. Return to normal activities tomorrow.                            Written discharge instructions were provided to the                            patient.                           - Resume previous diet.                           - Continue present medications.                           - Await pathology results.                           - No aspirin, ibuprofen, naproxen, or other                            non-steroidal anti-inflammatory drugs for 3 weeks                            after polyp removal. Procedure Code(s):        --- Professional ---                            [phone], Colonoscopy, flexible; with removal of                            tumor(s), polyp(s), or other lesion(s) by snare                            technique                           45380, 59, Colonoscopy, flexible; with biopsy,  single or multiple                           45381, Colonoscopy, flexible; with directed                            submucosal injection(s), any substance Diagnosis Code(s):        --- Professional ---                           R19.5, Other fecal abnormalities                           R63.4, Abnormal weight loss CPT copyright 2019 American Medical Association. All rights reserved. The codes documented in this report are preliminary and upon coder review may  be revised to meet current compliance requirements. Ladene Artist, MD 03/12/2020 3:19:50 PM This report has been signed electronically. Number of Addenda: 0

## 2020-03-12 NOTE — Telephone Encounter (Signed)
Spoke with [name]. The phone calls from the [facility] and another call from "someone with [facility]" had confused the patient. She thought her time and location had changed. I checked the schedule for [facility]. The patient is still listed as a 12:30 start at [facility]. Instructed her to be at Admitting at 11:00 am.

## 2020-03-12 NOTE — Anesthesia Preprocedure Evaluation (Addendum)
Anesthesia Evaluation  Patient identified by MRN, date of birth, ID band Patient awake    Reviewed: Allergy & Precautions, NPO status , Patient's Chart, lab work & pertinent test results  History of Anesthesia Complications (+) PONV and history of anesthetic complications  Airway Mallampati: II  TM Distance: >3 FB Neck ROM: Full    Dental  (+) Poor Dentition, Missing   Pulmonary shortness of breath, asthma , sleep apnea and Oxygen sleep apnea ,    Pulmonary exam normal breath sounds clear to auscultation       Cardiovascular hypertension, + CAD and +CHF  Normal cardiovascular exam+ dysrhythmias Atrial Fibrillation  Rhythm:Regular Rate:Normal  ECG: rate 65   Neuro/Psych  Headaches, PSYCHIATRIC DISORDERS Anxiety Depression Dementia    GI/Hepatic Neg liver ROS, hiatal hernia, GERD  Medicated and Controlled,  Endo/Other  diabetes, Insulin DependentHypothyroidism Morbid obesity  Renal/GU ESRF and DialysisRenal diseaseOn HD T, R, Sat     Musculoskeletal  (+) Arthritis , Gout   Abdominal (+) + obese,   Peds  Hematology HLD   Anesthesia Other Findings diarrhea and vomiting  Reproductive/Obstetrics                            Anesthesia Physical Anesthesia Plan  ASA: IV  Anesthesia Plan: MAC   Post-op Pain Management:    Induction: Intravenous  PONV Risk Score and Plan: 3 and Propofol infusion and Treatment may vary due to age or medical condition  Airway Management Planned: Nasal Cannula  Additional Equipment:   Intra-op Plan:   Post-operative Plan:   Informed Consent: I have reviewed the patients History and Physical, chart, labs and discussed the procedure including the risks, benefits and alternatives for the proposed anesthesia with the patient or authorized representative who has indicated his/her understanding and acceptance.     Dental advisory given  Plan Discussed with:  CRNA  Anesthesia Plan Comments:         Anesthesia Quick Evaluation

## 2020-03-12 NOTE — Op Note (Signed)
[facility] Patient Name: [name] Procedure Date: 03/12/2020 MRN: [id number] Attending MD: Ladene Artist , MD Date of Birth: [birth year] CSN: [id number] Age: 80 Admit Type: Outpatient Procedure:                Upper GI endoscopy Indications:              Heme positive stool, Melena, Nausea with vomiting Providers:                Pricilla Riffle. Fuller Plan, MD, Cleda Daub, RN, Benetta Spar, Technician Referring MD:              Medicines:                Monitored Anesthesia Care Complications:            No immediate complications. Estimated Blood Loss:     Estimated blood loss was minimal. Procedure:                Pre-Anesthesia Assessment:                           - Prior to the procedure, a History and Physical                            was performed, and patient medications and                            allergies were reviewed. The patient's tolerance of                            previous anesthesia was also reviewed. The risks                            and benefits of the procedure and the sedation                            options and risks were discussed with the patient.                            All questions were answered, and informed consent                            was obtained. Prior Anticoagulants: The patient has                            taken Eliquis (apixaban), last dose was 3 days                            prior to procedure. ASA Grade Assessment: III - A                            patient with severe systemic disease. After  reviewing the risks and benefits, the patient was                            deemed in satisfactory condition to undergo the                            procedure.                           - Prior to the procedure, a History and Physical                            was performed, and patient medications and                            allergies were reviewed.  The patient's tolerance of                            previous anesthesia was also reviewed. The risks                            and benefits of the procedure and the sedation                            options and risks were discussed with the patient.                            All questions were answered, and informed consent                            was obtained. Prior Anticoagulants: The patient has                            taken Eliquis (apixaban), last dose was 3 days                            prior to procedure. ASA Grade Assessment: III - A                            patient with severe systemic disease. After                            reviewing the risks and benefits, the patient was                            deemed in satisfactory condition to undergo the                            procedure.                           After obtaining informed consent, the endoscope was  passed under direct vision. Throughout the                            procedure, the patient's blood pressure, pulse, and                            oxygen saturations were monitored continuously. The                            [id number]-H190 ([id number] Olympus gastroscope was                            introduced through the mouth, and advanced to the                            second part of duodenum. The upper GI endoscopy was                            accomplished without difficulty. The patient                            tolerated the procedure well. Scope In: Scope Out: Findings:      The examined esophagus was normal.      Localized mild inflammation characterized by erythema and granularity       was found in the gastric antrum. Biopsies were taken with a cold forceps       for histology. Mild oozing at biopsy sites the stopped spontaneously.      The exam of the stomach was otherwise normal.      The duodenal bulb and second portion of the duodenum were normal. Impression:                - Normal esophagus.                           - Gastritis. Biopsied.                           - Normal duodenal bulb and second portion of the                            duodenum. Moderate Sedation:      Not Applicable - Patient had care per Anesthesia. Recommendation:           - Patient has a contact number available for                            emergencies. The signs and symptoms of potential                            delayed complications were discussed with the                            patient. Return to normal activities tomorrow.  Written discharge instructions were provided to the                            patient.                           - Resume previous diet.                           - Continue present medications.                           - Resume Eliquis (apixaban) at prior dose in 5                            days. Refer to managing physician for further                            adjustment of therapy.                           - Await pathology results. Procedure Code(s):        --- Professional ---                           [phone], Esophagogastroduodenoscopy, flexible,                            transoral; with biopsy, single or multiple Diagnosis Code(s):        --- Professional ---                           K29.70, Gastritis, unspecified, without bleeding                           R19.5, Other fecal abnormalities                           K92.1, Melena (includes Hematochezia)                           R11.2, Nausea with vomiting, unspecified CPT copyright 2019 American Medical Association. All rights reserved. The codes documented in this report are preliminary and upon coder review may  be revised to meet current compliance requirements. Ladene Artist, MD 03/12/2020 3:23:48 PM This report has been signed electronically. Number of Addenda: 0

## 2020-03-13 ENCOUNTER — Encounter (HOSPITAL_COMMUNITY): Payer: Self-pay | Admitting: Gastroenterology

## 2020-03-13 LAB — POCT I-STAT, CHEM 8
BUN: 18 mg/dL (ref 8–23)
Calcium, Ion: 0.94 mmol/L — ABNORMAL LOW (ref 1.15–1.40)
Chloride: 103 mmol/L (ref 98–111)
Creatinine, Ser: 4 mg/dL — ABNORMAL HIGH (ref 0.44–1.00)
Glucose, Bld: 67 mg/dL — ABNORMAL LOW (ref 70–99)
HCT: 31 % — ABNORMAL LOW (ref 36.0–46.0)
Hemoglobin: 10.5 g/dL — ABNORMAL LOW (ref 12.0–15.0)
Potassium: 7.2 mmol/L (ref 3.5–5.1)
Sodium: 137 mmol/L (ref 135–145)
TCO2: 23 mmol/L (ref 22–32)

## 2020-03-13 NOTE — Anesthesia Postprocedure Evaluation (Signed)
Anesthesia Post Note  Patient: [name]  Procedure(s) Performed: ESOPHAGOGASTRODUODENOSCOPY (EGD) WITH PROPOFOL (N/A ) COLONOSCOPY WITH PROPOFOL (N/A ) POLYPECTOMY SUBMUCOSAL TATTOO INJECTION BIOPSY     Patient location during evaluation: Endoscopy Anesthesia Type: MAC Level of consciousness: awake Pain management: pain level controlled Vital Signs Assessment: post-procedure vital signs reviewed and stable Respiratory status: spontaneous breathing, nonlabored ventilation, respiratory function stable and patient connected to nasal cannula oxygen Cardiovascular status: stable and blood pressure returned to baseline Postop Assessment: no apparent nausea or vomiting Anesthetic complications: no   No complications documented.  Last Vitals:  Vitals:   03/12/20 1530 03/12/20 1540  BP: (!) 91/44 (!) 109/53  Pulse: 71 100  Resp: (!) 22 17  Temp:    SpO2: 100% 97%    Last Pain:  Vitals:   03/12/20 1540  TempSrc:   PainSc: 0-No pain                 Imaad Reuss P Dmitri Pettigrew

## 2020-03-15 ENCOUNTER — Telehealth: Payer: Self-pay | Admitting: Gastroenterology

## 2020-03-15 ENCOUNTER — Other Ambulatory Visit: Payer: Self-pay

## 2020-03-15 ENCOUNTER — Telehealth: Payer: Self-pay

## 2020-03-15 DIAGNOSIS — C801 Malignant (primary) neoplasm, unspecified: Secondary | ICD-10-CM

## 2020-03-15 LAB — SURGICAL PATHOLOGY

## 2020-03-15 NOTE — Telephone Encounter (Signed)
Do you want the CTs  W/ contrast, with creatine elevated ? Please advise

## 2020-03-15 NOTE — Telephone Encounter (Signed)
-----   Message from Ladene Artist, MD sent at 03/15/2020  3:48 PM EST ----- I reviewed the pathology report and spoke directly with the pathologist today. I called patient at home and reviewed results with her. I tried twice to call her daughter and calls went to VM. The patient has a good understanding of the findings and plans.   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  Large descending colon polyp removed piecemeal. 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 The polyp had a short wide stalk and the adenocarcinoma is at the base of the stalk. This area is marked with 2 tattoos.  Other polyps were benign TAs, SPPs.  Gastric biopsies showed a reactive gastropathy which is a benign irritation of the stomach lining.  Recommend:  CT chest, abd, pelvis. CMP, CBC, CEA Referral to Drs. Marcello Moores or [facility]

## 2020-03-15 NOTE — Telephone Encounter (Signed)
Pt's daughter [name] called inquiring about path results.

## 2020-03-15 NOTE — Telephone Encounter (Signed)
Yes with contrast. She already has ESRD and she's on HD on Tu-Th-Sa schedule.

## 2020-03-15 NOTE — Telephone Encounter (Signed)
See pathology result note 

## 2020-03-19 ENCOUNTER — Ambulatory Visit (HOSPITAL_COMMUNITY): Payer: Medicare HMO

## 2020-03-20 ENCOUNTER — Other Ambulatory Visit: Payer: Self-pay

## 2020-03-20 ENCOUNTER — Ambulatory Visit (HOSPITAL_COMMUNITY)
Admission: RE | Admit: 2020-03-20 | Discharge: 2020-03-20 | Disposition: A | Discharge status: Home / Self Care | Payer: Medicare HMO | Source: Ambulatory Visit | Attending: Gastroenterology | Admitting: Gastroenterology

## 2020-03-20 DIAGNOSIS — C801 Malignant (primary) neoplasm, unspecified: Secondary | ICD-10-CM | POA: Insufficient documentation

## 2020-03-20 DIAGNOSIS — N993 Prolapse of vaginal vault after hysterectomy: Secondary | ICD-10-CM | POA: Insufficient documentation

## 2020-03-20 DIAGNOSIS — Z905 Acquired absence of kidney: Secondary | ICD-10-CM | POA: Insufficient documentation

## 2020-03-20 DIAGNOSIS — N2889 Other specified disorders of kidney and ureter: Secondary | ICD-10-CM | POA: Insufficient documentation

## 2020-03-20 DIAGNOSIS — I700 Atherosclerosis of aorta: Secondary | ICD-10-CM | POA: Insufficient documentation

## 2020-03-20 MED ORDER — IOHEXOL 9 MG/ML PO SOLN: 1000.0000 mL | ORAL | Status: AC

## 2020-03-20 MED ORDER — IOHEXOL 300 MG/ML  SOLN
100.0000 mL | Freq: Once | INTRAMUSCULAR | Status: AC | PRN
  Administered 2020-03-20: 80 mL via INTRAVENOUS

## 2020-03-20 MED ORDER — IOHEXOL 9 MG/ML PO SOLN
ORAL | Status: AC
  Administered 2020-03-20: 1000 mL via ORAL
  Filled 2020-03-20: qty 1000

## 2020-03-21 NOTE — Telephone Encounter (Signed)
[name], [name] calling back requesting a call back to get clarification on patient's results.  Can be reached at [phone].

## 2020-03-21 NOTE — Telephone Encounter (Signed)
Returned call to her daughter to review the pathology and recommendations. She had several questions and I addressed them to her satisfaction. She thanked me for the call. Awaiting CT results, blood work results and surgical consult.

## 2020-03-21 NOTE — Telephone Encounter (Signed)
[name] daughter would like for Dr Fuller Plan to call her at [phone] to discuss the pt cancer.  She is sorry she missed his call previously. Her father had a heart attack and she was with him.

## 2020-03-22 ENCOUNTER — Other Ambulatory Visit (INDEPENDENT_AMBULATORY_CARE_PROVIDER_SITE_OTHER): Payer: Medicare HMO

## 2020-03-22 DIAGNOSIS — C801 Malignant (primary) neoplasm, unspecified: Secondary | ICD-10-CM

## 2020-03-22 LAB — CBC WITH DIFFERENTIAL/PLATELET
Basophils Absolute: 0.1 10*3/uL (ref 0.0–0.1)
Basophils Relative: 0.8 % (ref 0.0–3.0)
Eosinophils Absolute: 0.3 10*3/uL (ref 0.0–0.7)
Eosinophils Relative: 4.9 % (ref 0.0–5.0)
HCT: 37.3 % (ref 36.0–46.0)
Hemoglobin: 11.9 g/dL — ABNORMAL LOW (ref 12.0–15.0)
Lymphocytes Relative: 25.3 % (ref 12.0–46.0)
Lymphs Abs: 1.8 10*3/uL (ref 0.7–4.0)
MCHC: 31.9 g/dL (ref 30.0–36.0)
MCV: 91.9 fl (ref 78.0–100.0)
Monocytes Absolute: 0.6 10*3/uL (ref 0.1–1.0)
Monocytes Relative: 9.1 % (ref 3.0–12.0)
Neutro Abs: 4.2 10*3/uL (ref 1.4–7.7)
Neutrophils Relative %: 59.9 % (ref 43.0–77.0)
Platelets: 249 10*3/uL (ref 150.0–400.0)
RBC: 4.06 Mil/uL (ref 3.87–5.11)
RDW: 16.3 % — ABNORMAL HIGH (ref 11.5–15.5)
WBC: 7 10*3/uL (ref 4.0–10.5)

## 2020-03-22 LAB — COMPREHENSIVE METABOLIC PANEL
ALT: 11 U/L (ref 0–35)
AST: 21 U/L (ref 0–37)
Albumin: 3.1 g/dL — ABNORMAL LOW (ref 3.5–5.2)
Alkaline Phosphatase: 62 U/L (ref 39–117)
BUN: 30 mg/dL — ABNORMAL HIGH (ref 6–23)
CO2: 27 mEq/L (ref 19–32)
Calcium: 9.1 mg/dL (ref 8.4–10.5)
Chloride: 101 mEq/L (ref 96–112)
Creatinine, Ser: 6.46 mg/dL (ref 0.40–1.20)
GFR: 5.79 mL/min — CL (ref >60.00)
Glucose, Bld: 167 mg/dL — ABNORMAL HIGH (ref 70–99)
Potassium: 4.1 mEq/L (ref 3.5–5.1)
Sodium: 137 mEq/L (ref 135–145)
Total Bilirubin: 0.5 mg/dL (ref 0.2–1.2)
Total Protein: 6.2 g/dL (ref 6.0–8.3)

## 2020-03-23 LAB — CEA: CEA: 1.2 ng/mL

## 2020-04-01 NOTE — Telephone Encounter (Signed)
[facility] called regarding a referral they received for this patient. They said as they read the paperwork it says Dr. Marcello Moores or Dema Severin so they want to confirm please call [phone]

## 2020-04-01 NOTE — Telephone Encounter (Signed)
Returned call to [facility] at the phone # that was given, left message on their voice mail to please call back. This patient was referred to [facility] and saw Dr. Marcello Moores on 03/25/20.

## 2020-05-07 DIAGNOSIS — Z9981 Dependence on supplemental oxygen: Secondary | ICD-10-CM

## 2020-05-07 HISTORY — DX: Dependence on supplemental oxygen: Z99.81

## 2020-06-05 ENCOUNTER — Ambulatory Visit (HOSPITAL_COMMUNITY): Payer: Medicare HMO | Admitting: Nurse Practitioner

## 2020-06-15 ENCOUNTER — Emergency Department (HOSPITAL_COMMUNITY)
Admission: EM | Admit: 2020-06-15 | Discharge: 2020-06-15 | Disposition: A | Discharge status: Home / Self Care | Payer: Medicare HMO | Attending: Emergency Medicine | Admitting: Emergency Medicine

## 2020-06-15 ENCOUNTER — Other Ambulatory Visit: Payer: Self-pay

## 2020-06-15 ENCOUNTER — Emergency Department (HOSPITAL_COMMUNITY): Payer: Medicare HMO

## 2020-06-15 ENCOUNTER — Encounter (HOSPITAL_COMMUNITY): Payer: Self-pay | Admitting: *Deleted

## 2020-06-15 DIAGNOSIS — D631 Anemia in chronic kidney disease: Secondary | ICD-10-CM | POA: Diagnosis not present

## 2020-06-15 DIAGNOSIS — I132 Hypertensive heart and chronic kidney disease with heart failure and with stage 5 chronic kidney disease, or end stage renal disease: Secondary | ICD-10-CM | POA: Diagnosis not present

## 2020-06-15 DIAGNOSIS — Z8616 Personal history of COVID-19: Secondary | ICD-10-CM | POA: Diagnosis not present

## 2020-06-15 DIAGNOSIS — M25562 Pain in left knee: Secondary | ICD-10-CM | POA: Diagnosis not present

## 2020-06-15 DIAGNOSIS — M25571 Pain in right ankle and joints of right foot: Secondary | ICD-10-CM | POA: Diagnosis not present

## 2020-06-15 DIAGNOSIS — Z7951 Long term (current) use of inhaled steroids: Secondary | ICD-10-CM | POA: Insufficient documentation

## 2020-06-15 DIAGNOSIS — Z7901 Long term (current) use of anticoagulants: Secondary | ICD-10-CM | POA: Insufficient documentation

## 2020-06-15 DIAGNOSIS — I5043 Acute on chronic combined systolic (congestive) and diastolic (congestive) heart failure: Secondary | ICD-10-CM | POA: Insufficient documentation

## 2020-06-15 DIAGNOSIS — Z91040 Latex allergy status: Secondary | ICD-10-CM | POA: Diagnosis not present

## 2020-06-15 DIAGNOSIS — E039 Hypothyroidism, unspecified: Secondary | ICD-10-CM | POA: Insufficient documentation

## 2020-06-15 DIAGNOSIS — I2510 Atherosclerotic heart disease of native coronary artery without angina pectoris: Secondary | ICD-10-CM | POA: Diagnosis not present

## 2020-06-15 DIAGNOSIS — Z794 Long term (current) use of insulin: Secondary | ICD-10-CM | POA: Insufficient documentation

## 2020-06-15 DIAGNOSIS — Z85038 Personal history of other malignant neoplasm of large intestine: Secondary | ICD-10-CM | POA: Insufficient documentation

## 2020-06-15 DIAGNOSIS — I480 Paroxysmal atrial fibrillation: Secondary | ICD-10-CM | POA: Diagnosis not present

## 2020-06-15 DIAGNOSIS — M25561 Pain in right knee: Secondary | ICD-10-CM | POA: Diagnosis present

## 2020-06-15 DIAGNOSIS — W050XXA Fall from non-moving wheelchair, initial encounter: Secondary | ICD-10-CM | POA: Insufficient documentation

## 2020-06-15 DIAGNOSIS — J45909 Unspecified asthma, uncomplicated: Secondary | ICD-10-CM | POA: Insufficient documentation

## 2020-06-15 DIAGNOSIS — N186 End stage renal disease: Secondary | ICD-10-CM | POA: Diagnosis not present

## 2020-06-15 DIAGNOSIS — W19XXXA Unspecified fall, initial encounter: Secondary | ICD-10-CM

## 2020-06-15 DIAGNOSIS — Z8553 Personal history of malignant neoplasm of renal pelvis: Secondary | ICD-10-CM | POA: Insufficient documentation

## 2020-06-15 DIAGNOSIS — E1129 Type 2 diabetes mellitus with other diabetic kidney complication: Secondary | ICD-10-CM | POA: Insufficient documentation

## 2020-06-15 DIAGNOSIS — M25572 Pain in left ankle and joints of left foot: Secondary | ICD-10-CM | POA: Insufficient documentation

## 2020-06-15 DIAGNOSIS — Z992 Dependence on renal dialysis: Secondary | ICD-10-CM | POA: Diagnosis not present

## 2020-06-15 DIAGNOSIS — E1140 Type 2 diabetes mellitus with diabetic neuropathy, unspecified: Secondary | ICD-10-CM | POA: Diagnosis not present

## 2020-06-15 DIAGNOSIS — Z79899 Other long term (current) drug therapy: Secondary | ICD-10-CM | POA: Diagnosis not present

## 2020-06-15 DIAGNOSIS — E1122 Type 2 diabetes mellitus with diabetic chronic kidney disease: Secondary | ICD-10-CM | POA: Insufficient documentation

## 2020-06-15 LAB — CBC
HCT: 34 % — ABNORMAL LOW (ref 36.0–46.0)
Hemoglobin: 10.2 g/dL — ABNORMAL LOW (ref 12.0–15.0)
MCH: 30.5 pg (ref 26.0–34.0)
MCHC: 30 g/dL (ref 30.0–36.0)
MCV: 101.8 fL — ABNORMAL HIGH (ref 80.0–100.0)
Platelets: 200 10*3/uL (ref 150–400)
RBC: 3.34 MIL/uL — ABNORMAL LOW (ref 3.87–5.11)
RDW: 17 % — ABNORMAL HIGH (ref 11.5–15.5)
WBC: 5.9 10*3/uL (ref 4.0–10.5)
nRBC: 0 % (ref 0.0–0.2)

## 2020-06-15 LAB — BASIC METABOLIC PANEL
Anion gap: 11 (ref 5–15)
BUN: 20 mg/dL (ref 8–23)
CO2: 25 mmol/L (ref 22–32)
Calcium: 8.5 mg/dL — ABNORMAL LOW (ref 8.9–10.3)
Chloride: 101 mmol/L (ref 98–111)
Creatinine, Ser: 5.84 mg/dL — ABNORMAL HIGH (ref 0.44–1.00)
GFR, Estimated: 7 mL/min — ABNORMAL LOW (ref >60)
Glucose, Bld: 187 mg/dL — ABNORMAL HIGH (ref 70–99)
Potassium: 4.7 mmol/L (ref 3.5–5.1)
Sodium: 137 mmol/L (ref 135–145)

## 2020-06-15 NOTE — ED Notes (Signed)
Pt transported to Xray. 

## 2020-06-15 NOTE — Discharge Instructions (Addendum)
As discussed, your evaluation today has been largely reassuring.  But, it is important that you monitor your condition carefully, and do not hesitate to return to the ED if you develop new, or concerning changes in your condition. ? ?Otherwise, please follow-up with your physician for appropriate ongoing care. ? ?

## 2020-06-15 NOTE — ED Triage Notes (Signed)
Pt was being transferred by her son-in-law from wheelchair, after dialysis, and she fell.  No loc.  She landed first on the wheelchair and only her legs hit the ground.  Pt refused care yesterday, but today is c/o pain to legs bil.  Family states pt appears weaker than normal. R dialysis catheter placed yesterday and pt received 3 hours of dialysis.
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
## 2020-06-20 ENCOUNTER — Other Ambulatory Visit: Payer: Self-pay | Admitting: Internal Medicine

## 2020-06-20 ENCOUNTER — Emergency Department (HOSPITAL_COMMUNITY): Payer: Medicare HMO

## 2020-06-20 ENCOUNTER — Encounter (HOSPITAL_COMMUNITY): Payer: Self-pay | Admitting: *Deleted

## 2020-06-20 ENCOUNTER — Inpatient Hospital Stay (HOSPITAL_COMMUNITY)
Admission: EM | Admit: 2020-06-20 | Discharge: 2020-06-29 | DRG: 314 | Disposition: A | Discharge status: Home Health | Payer: Medicare HMO | Attending: Internal Medicine | Admitting: Internal Medicine

## 2020-06-20 ENCOUNTER — Other Ambulatory Visit: Payer: Self-pay

## 2020-06-20 DIAGNOSIS — E1143 Type 2 diabetes mellitus with diabetic autonomic (poly)neuropathy: Secondary | ICD-10-CM | POA: Diagnosis present

## 2020-06-20 DIAGNOSIS — Z91018 Allergy to other foods: Secondary | ICD-10-CM

## 2020-06-20 DIAGNOSIS — E1169 Type 2 diabetes mellitus with other specified complication: Secondary | ICD-10-CM | POA: Diagnosis present

## 2020-06-20 DIAGNOSIS — E039 Hypothyroidism, unspecified: Secondary | ICD-10-CM | POA: Diagnosis present

## 2020-06-20 DIAGNOSIS — R6520 Severe sepsis without septic shock: Secondary | ICD-10-CM | POA: Diagnosis present

## 2020-06-20 DIAGNOSIS — C180 Malignant neoplasm of cecum: Secondary | ICD-10-CM | POA: Diagnosis present

## 2020-06-20 DIAGNOSIS — Z79890 Hormone replacement therapy (postmenopausal): Secondary | ICD-10-CM

## 2020-06-20 DIAGNOSIS — E1122 Type 2 diabetes mellitus with diabetic chronic kidney disease: Secondary | ICD-10-CM | POA: Diagnosis present

## 2020-06-20 DIAGNOSIS — Z79899 Other long term (current) drug therapy: Secondary | ICD-10-CM

## 2020-06-20 DIAGNOSIS — L03221 Cellulitis of neck: Secondary | ICD-10-CM | POA: Diagnosis present

## 2020-06-20 DIAGNOSIS — D631 Anemia in chronic kidney disease: Secondary | ICD-10-CM | POA: Diagnosis present

## 2020-06-20 DIAGNOSIS — Z419 Encounter for procedure for purposes other than remedying health state, unspecified: Secondary | ICD-10-CM

## 2020-06-20 DIAGNOSIS — T827XXA Infection and inflammatory reaction due to other cardiac and vascular devices, implants and grafts, initial encounter: Secondary | ICD-10-CM | POA: Diagnosis present

## 2020-06-20 DIAGNOSIS — R54 Age-related physical debility: Secondary | ICD-10-CM | POA: Diagnosis present

## 2020-06-20 DIAGNOSIS — Z833 Family history of diabetes mellitus: Secondary | ICD-10-CM

## 2020-06-20 DIAGNOSIS — R296 Repeated falls: Secondary | ICD-10-CM | POA: Diagnosis present

## 2020-06-20 DIAGNOSIS — Z91040 Latex allergy status: Secondary | ICD-10-CM

## 2020-06-20 DIAGNOSIS — I313 Pericardial effusion (noninflammatory): Secondary | ICD-10-CM | POA: Diagnosis not present

## 2020-06-20 DIAGNOSIS — E785 Hyperlipidemia, unspecified: Secondary | ICD-10-CM | POA: Diagnosis not present

## 2020-06-20 DIAGNOSIS — B9561 Methicillin susceptible Staphylococcus aureus infection as the cause of diseases classified elsewhere: Secondary | ICD-10-CM | POA: Diagnosis not present

## 2020-06-20 DIAGNOSIS — I428 Other cardiomyopathies: Secondary | ICD-10-CM | POA: Diagnosis present

## 2020-06-20 DIAGNOSIS — Z8616 Personal history of COVID-19: Secondary | ICD-10-CM

## 2020-06-20 DIAGNOSIS — Z885 Allergy status to narcotic agent status: Secondary | ICD-10-CM

## 2020-06-20 DIAGNOSIS — Z85528 Personal history of other malignant neoplasm of kidney: Secondary | ICD-10-CM

## 2020-06-20 DIAGNOSIS — Z992 Dependence on renal dialysis: Secondary | ICD-10-CM | POA: Diagnosis not present

## 2020-06-20 DIAGNOSIS — Z880 Allergy status to penicillin: Secondary | ICD-10-CM

## 2020-06-20 DIAGNOSIS — J45909 Unspecified asthma, uncomplicated: Secondary | ICD-10-CM | POA: Diagnosis present

## 2020-06-20 DIAGNOSIS — I480 Paroxysmal atrial fibrillation: Secondary | ICD-10-CM | POA: Diagnosis present

## 2020-06-20 DIAGNOSIS — F0390 Unspecified dementia without behavioral disturbance: Secondary | ICD-10-CM | POA: Diagnosis present

## 2020-06-20 DIAGNOSIS — Z888 Allergy status to other drugs, medicaments and biological substances status: Secondary | ICD-10-CM

## 2020-06-20 DIAGNOSIS — K3184 Gastroparesis: Secondary | ICD-10-CM | POA: Diagnosis present

## 2020-06-20 DIAGNOSIS — T80211A Bloodstream infection due to central venous catheter, initial encounter: Secondary | ICD-10-CM | POA: Diagnosis not present

## 2020-06-20 DIAGNOSIS — Z20822 Contact with and (suspected) exposure to covid-19: Secondary | ICD-10-CM | POA: Diagnosis present

## 2020-06-20 DIAGNOSIS — I2510 Atherosclerotic heart disease of native coronary artery without angina pectoris: Secondary | ICD-10-CM | POA: Diagnosis present

## 2020-06-20 DIAGNOSIS — Y841 Kidney dialysis as the cause of abnormal reaction of the patient, or of later complication, without mention of misadventure at the time of the procedure: Secondary | ICD-10-CM | POA: Diagnosis present

## 2020-06-20 DIAGNOSIS — E875 Hyperkalemia: Secondary | ICD-10-CM

## 2020-06-20 DIAGNOSIS — A4101 Sepsis due to Methicillin susceptible Staphylococcus aureus: Secondary | ICD-10-CM | POA: Diagnosis present

## 2020-06-20 DIAGNOSIS — Q211 Atrial septal defect: Secondary | ICD-10-CM | POA: Diagnosis not present

## 2020-06-20 DIAGNOSIS — Z1509 Genetic susceptibility to other malignant neoplasm: Secondary | ICD-10-CM

## 2020-06-20 DIAGNOSIS — J9611 Chronic respiratory failure with hypoxia: Secondary | ICD-10-CM | POA: Diagnosis present

## 2020-06-20 DIAGNOSIS — M109 Gout, unspecified: Secondary | ICD-10-CM | POA: Diagnosis present

## 2020-06-20 DIAGNOSIS — Y712 Prosthetic and other implants, materials and accessory cardiovascular devices associated with adverse incidents: Secondary | ICD-10-CM | POA: Diagnosis not present

## 2020-06-20 DIAGNOSIS — M4316 Spondylolisthesis, lumbar region: Secondary | ICD-10-CM | POA: Diagnosis present

## 2020-06-20 DIAGNOSIS — R7881 Bacteremia: Secondary | ICD-10-CM | POA: Diagnosis not present

## 2020-06-20 DIAGNOSIS — E872 Acidosis: Secondary | ICD-10-CM | POA: Diagnosis present

## 2020-06-20 DIAGNOSIS — N186 End stage renal disease: Secondary | ICD-10-CM | POA: Diagnosis present

## 2020-06-20 DIAGNOSIS — I4891 Unspecified atrial fibrillation: Secondary | ICD-10-CM | POA: Diagnosis present

## 2020-06-20 DIAGNOSIS — Z6838 Body mass index (BMI) 38.0-38.9, adult: Secondary | ICD-10-CM | POA: Diagnosis not present

## 2020-06-20 DIAGNOSIS — Z7901 Long term (current) use of anticoagulants: Secondary | ICD-10-CM

## 2020-06-20 DIAGNOSIS — A419 Sepsis, unspecified organism: Secondary | ICD-10-CM

## 2020-06-20 DIAGNOSIS — J9811 Atelectasis: Secondary | ICD-10-CM | POA: Diagnosis present

## 2020-06-20 DIAGNOSIS — Z8249 Family history of ischemic heart disease and other diseases of the circulatory system: Secondary | ICD-10-CM

## 2020-06-20 DIAGNOSIS — Z800 Family history of malignant neoplasm of digestive organs: Secondary | ICD-10-CM

## 2020-06-20 DIAGNOSIS — I132 Hypertensive heart and chronic kidney disease with heart failure and with stage 5 chronic kidney disease, or end stage renal disease: Secondary | ICD-10-CM | POA: Diagnosis present

## 2020-06-20 DIAGNOSIS — W19XXXA Unspecified fall, initial encounter: Secondary | ICD-10-CM

## 2020-06-20 DIAGNOSIS — I5042 Chronic combined systolic (congestive) and diastolic (congestive) heart failure: Secondary | ICD-10-CM | POA: Diagnosis present

## 2020-06-20 DIAGNOSIS — Z91048 Other nonmedicinal substance allergy status: Secondary | ICD-10-CM

## 2020-06-20 DIAGNOSIS — I4892 Unspecified atrial flutter: Secondary | ICD-10-CM | POA: Diagnosis present

## 2020-06-20 DIAGNOSIS — Z881 Allergy status to other antibiotic agents status: Secondary | ICD-10-CM

## 2020-06-20 DIAGNOSIS — Z794 Long term (current) use of insulin: Secondary | ICD-10-CM

## 2020-06-20 DIAGNOSIS — Z79818 Long term (current) use of other agents affecting estrogen receptors and estrogen levels: Secondary | ICD-10-CM

## 2020-06-20 DIAGNOSIS — K219 Gastro-esophageal reflux disease without esophagitis: Secondary | ICD-10-CM | POA: Diagnosis present

## 2020-06-20 DIAGNOSIS — I252 Old myocardial infarction: Secondary | ICD-10-CM

## 2020-06-20 DIAGNOSIS — Z9981 Dependence on supplemental oxygen: Secondary | ICD-10-CM

## 2020-06-20 HISTORY — DX: Methicillin susceptible Staphylococcus aureus infection as the cause of diseases classified elsewhere: B95.61

## 2020-06-20 HISTORY — DX: Bacteremia: R78.81

## 2020-06-20 LAB — RESP PANEL BY RT-PCR (FLU A&B, COVID) ARPGX2
Influenza A by PCR: NEGATIVE
Influenza B by PCR: NEGATIVE
SARS Coronavirus 2 by RT PCR: NEGATIVE

## 2020-06-20 LAB — CBC WITH DIFFERENTIAL/PLATELET
Abs Immature Granulocytes: 0.06 10*3/uL (ref 0.00–0.07)
Basophils Absolute: 0.1 10*3/uL (ref 0.0–0.1)
Basophils Relative: 1 %
Eosinophils Absolute: 0 10*3/uL (ref 0.0–0.5)
Eosinophils Relative: 0 %
HCT: 37.4 % (ref 36.0–46.0)
Hemoglobin: 11.5 g/dL — ABNORMAL LOW (ref 12.0–15.0)
Immature Granulocytes: 1 %
Lymphocytes Relative: 7 %
Lymphs Abs: 1 10*3/uL (ref 0.7–4.0)
MCH: 30.7 pg (ref 26.0–34.0)
MCHC: 30.7 g/dL (ref 30.0–36.0)
MCV: 100 fL (ref 80.0–100.0)
Monocytes Absolute: 1 10*3/uL (ref 0.1–1.0)
Monocytes Relative: 7 %
Neutro Abs: 11.1 10*3/uL — ABNORMAL HIGH (ref 1.7–7.7)
Neutrophils Relative %: 84 %
Platelets: 230 10*3/uL (ref 150–400)
RBC: 3.74 MIL/uL — ABNORMAL LOW (ref 3.87–5.11)
RDW: 16.1 % — ABNORMAL HIGH (ref 11.5–15.5)
WBC: 13.1 10*3/uL — ABNORMAL HIGH (ref 4.0–10.5)
nRBC: 0 % (ref 0.0–0.2)

## 2020-06-20 LAB — COMPREHENSIVE METABOLIC PANEL
ALT: 17 U/L (ref 0–44)
AST: 30 U/L (ref 15–41)
Albumin: 2.9 g/dL — ABNORMAL LOW (ref 3.5–5.0)
Alkaline Phosphatase: 121 U/L (ref 38–126)
Anion gap: 10 (ref 5–15)
BUN: 9 mg/dL (ref 8–23)
CO2: 30 mmol/L (ref 22–32)
Calcium: 8 mg/dL — ABNORMAL LOW (ref 8.9–10.3)
Chloride: 99 mmol/L (ref 98–111)
Creatinine, Ser: 3.46 mg/dL — ABNORMAL HIGH (ref 0.44–1.00)
GFR, Estimated: 13 mL/min — ABNORMAL LOW (ref >60)
Glucose, Bld: 140 mg/dL — ABNORMAL HIGH (ref 70–99)
Potassium: 3.7 mmol/L (ref 3.5–5.1)
Sodium: 139 mmol/L (ref 135–145)
Total Bilirubin: 1.1 mg/dL (ref 0.3–1.2)
Total Protein: 6.6 g/dL (ref 6.5–8.1)

## 2020-06-20 LAB — LACTIC ACID, PLASMA
Lactic Acid, Venous: 2.8 mmol/L (ref 0.5–1.9)
Lactic Acid, Venous: 2.6 mmol/L (ref 0.5–1.9)
Lactic Acid, Venous: 3.7 mmol/L (ref 0.5–1.9)

## 2020-06-20 LAB — PROTIME-INR
INR: 1.6 — ABNORMAL HIGH (ref 0.8–1.2)
Prothrombin Time: 18.9 seconds — ABNORMAL HIGH (ref 11.4–15.2)

## 2020-06-20 LAB — APTT: aPTT: 37 seconds — ABNORMAL HIGH (ref 24–36)

## 2020-06-20 LAB — TROPONIN I (HIGH SENSITIVITY): Troponin I (High Sensitivity): 11 ng/L (ref <18)

## 2020-06-20 LAB — CBG MONITORING, ED: Glucose-Capillary: 111 mg/dL — ABNORMAL HIGH (ref 70–99)

## 2020-06-20 MED ORDER — ALBUTEROL SULFATE (2.5 MG/3ML) 0.083% IN NEBU: 3.0000 mL | INHALATION_SOLUTION | Freq: Four times a day (QID) | RESPIRATORY_TRACT | Status: DC | PRN

## 2020-06-20 MED ORDER — LEVOTHYROXINE SODIUM 75 MCG PO TABS
175.0000 ug | ORAL_TABLET | Freq: Every day | ORAL | Status: DC
  Administered 2020-06-21 – 2020-06-29 (×9): 175 ug via ORAL
  Filled 2020-06-20 (×9): qty 1

## 2020-06-20 MED ORDER — ESTRADIOL 1 MG PO TABS
1.0000 mg | ORAL_TABLET | Freq: Every day | ORAL | Status: DC
  Administered 2020-06-21 – 2020-06-29 (×8): 1 mg via ORAL
  Filled 2020-06-20 (×10): qty 1

## 2020-06-20 MED ORDER — SODIUM CHLORIDE 0.9 % IV SOLN: 2.0000 g | INTRAVENOUS | Status: DC

## 2020-06-20 MED ORDER — INSULIN ASPART 100 UNIT/ML IJ SOLN
0.0000 [IU] | Freq: Every day | INTRAMUSCULAR | Status: DC
  Administered 2020-06-22 – 2020-06-26 (×2): 2 [IU] via SUBCUTANEOUS

## 2020-06-20 MED ORDER — ACETAMINOPHEN 650 MG RE SUPP: 650.0000 mg | Freq: Four times a day (QID) | RECTAL | Status: DC | PRN

## 2020-06-20 MED ORDER — INSULIN ASPART 100 UNIT/ML IJ SOLN
0.0000 [IU] | Freq: Three times a day (TID) | INTRAMUSCULAR | Status: DC
  Administered 2020-06-22: 1 [IU] via SUBCUTANEOUS
  Administered 2020-06-22: 2 [IU] via SUBCUTANEOUS
  Administered 2020-06-23 (×2): 1 [IU] via SUBCUTANEOUS
  Administered 2020-06-24: 2 [IU] via SUBCUTANEOUS
  Administered 2020-06-24 – 2020-06-26 (×3): 1 [IU] via SUBCUTANEOUS
  Administered 2020-06-27: 2 [IU] via SUBCUTANEOUS
  Administered 2020-06-28: 1 [IU] via SUBCUTANEOUS

## 2020-06-20 MED ORDER — SODIUM CHLORIDE 0.9 % IV SOLN
750.0000 mg | Freq: Once | INTRAVENOUS | Status: DC
  Filled 2020-06-20: qty 15

## 2020-06-20 MED ORDER — SIMVASTATIN 20 MG PO TABS
10.0000 mg | ORAL_TABLET | Freq: Every day | ORAL | Status: DC
  Administered 2020-06-20 – 2020-06-28 (×9): 10 mg via ORAL
  Filled 2020-06-20 (×9): qty 1

## 2020-06-20 MED ORDER — ACETAMINOPHEN 325 MG PO TABS
650.0000 mg | ORAL_TABLET | Freq: Four times a day (QID) | ORAL | Status: DC | PRN
  Administered 2020-06-21 – 2020-06-29 (×8): 650 mg via ORAL
  Filled 2020-06-20 (×9): qty 2

## 2020-06-20 MED ORDER — CLONAZEPAM 1 MG PO TABS
1.0000 mg | ORAL_TABLET | Freq: Every day | ORAL | Status: DC
  Administered 2020-06-21 – 2020-06-29 (×8): 1 mg via ORAL
  Filled 2020-06-20 (×8): qty 1

## 2020-06-20 MED ORDER — SODIUM CHLORIDE 0.9 % IV SOLN
1.0000 g | Freq: Once | INTRAVENOUS | Status: AC
  Administered 2020-06-20: 1 g via INTRAVENOUS
  Filled 2020-06-20: qty 10

## 2020-06-20 MED ORDER — LACTATED RINGERS IV BOLUS
1000.0000 mL | Freq: Once | INTRAVENOUS | Status: AC
  Administered 2020-06-20: 1000 mL via INTRAVENOUS

## 2020-06-20 MED ORDER — SODIUM CHLORIDE 0.9 % IV SOLN
8.0000 mg/kg | INTRAVENOUS | Status: DC
  Administered 2020-06-20: 800 mg via INTRAVENOUS

## 2020-06-20 MED ORDER — AMIODARONE HCL 200 MG PO TABS
200.0000 mg | ORAL_TABLET | Freq: Every day | ORAL | Status: DC
  Administered 2020-06-21 – 2020-06-29 (×9): 200 mg via ORAL
  Filled 2020-06-20 (×9): qty 1

## 2020-06-20 MED ORDER — FAMOTIDINE 20 MG PO TABS: 40.0000 mg | ORAL_TABLET | Freq: Every day | ORAL | Status: DC

## 2020-06-20 NOTE — ED Notes (Signed)
Patient refusing CT scans at this time.

## 2020-06-20 NOTE — ED Notes (Signed)
Patient brought back from triage by through put NT Vera. This RN observed patient adamantly refusing to be moved from wheelchair to bed because. " the bed really hurts my back". Dr. Burt Ek also at bedside explaining to patient that we need to have her in the bed to be medically examined and also explaining the importance of coming into the hospital.  Pt still refusing and now refusing treatment.

## 2020-06-20 NOTE — Progress Notes (Signed)
Pharmacy Antibiotic Note  [name] is a 80 y.o. female admitted on 06/20/2020 with sepsis.  Pharmacy has been consulted for daptomycin dosing in the setting of numerous drug allergies.   WBC 13.1, LA 2.6, SCr 3.46. Of note, patient is also on low dose simvastatin. Will need to monitor CK closely   Plan: -Start Daptomycin 800 mg (8 mg/kg) Q 48 hours -Monitor renal and weekly CK   Height: 5\' 2"  (157.5 cm) Weight: 99.5 kg (219 lb 5.7 oz) IBW/kg (Calculated) : 50.1  Temp (24hrs), Avg:99.9 F (37.7 C), Min:99 F (37.2 C), Max:100.8 F (38.2 C)  Recent Labs  Lab 06/15/20 1349 06/20/20 1649 06/20/20 1849  WBC 5.9 13.1*  --   CREATININE 5.84* 3.46*  --   LATICACIDVEN  --  2.8* 2.6*    Estimated Creatinine Clearance: 15 mL/min (A) (by C-G formula based on SCr of 3.46 mg/dL (H)).    Allergies  Allergen Reactions  . Adhesive [Tape] Itching, Swelling, Rash and Other (See Comments)    Tears skin and causes blisters also. EKG pads will cause welps.   . Avelox [Moxifloxacin] Swelling and Rash  . Blueberry Flavor Anaphylaxis  . Cefprozil Shortness Of Breath and Rash  . Cetacaine [Butamben-Tetracaine-Benzocaine] Nausea And Vomiting and Swelling  . Dicyclomine Nausea And Vomiting and Other (See Comments)    "Heart trouble"; Headaches and increased blood sugars "Heart trouble"; Headaches and increased blood sugars  . Food Anaphylaxis and Other (See Comments)    Melons, Bananas, Cantaloupes, Watermelon-throat closes up and blisters   . Imdur [Isosorbide Nitrate] Hives, Palpitations, Other (See Comments) and Rash    Headaches also  . Januvia [Sitagliptin] Shortness Of Breath  . Lipitor [Atorvastatin] Shortness Of Breath  . Losartan Potassium Shortness Of Breath  . Nitroglycerin Other (See Comments)    Caused cardiac arrest and feels like skin bring torn off back of head Caused cardiac arrest and feels like skin bring torn off back of head  . Omeprazole Shortness Of Breath and  Swelling  . Oxycodone Hives and Rash    Tolerates Dilaudid Tolerates Dilaudid  . Penicillins Anaphylaxis    Has patient had a PCN reaction causing immediate rash, facial/tongue/throat swelling, SOB or lightheadedness with hypotension: Yes Has patient had a PCN reaction causing severe rash involving mucus membranes or skin necrosis: No Has patient had a PCN reaction that required hospitalization Yes Has patient had a PCN reaction occurring within the last 10 years: No If all of the above answers are "NO", then may proceed with Cephalosporin use. Has patient had a PCN reaction causing immediate rash, facial/tongue/throat swelling, SOB or lightheadedness with hypotension: Yes Has patient had a PCN reaction causing severe rash involving mucus membranes or skin necrosis: No Has patient had a PCN reaction that required hospitalization Yes Has patient had a PCN reaction occurring within the last 10 years: No If all of the above answers are "NO", then may proceed with Cephalosporin use.   . Prednisone Anaphylaxis  . Vancomycin Anaphylaxis  . Hydrocodone Hives    Tolerates Dilaudid  . Latex Other (See Comments) and Rash    blisters  . Tamiflu [Oseltamivir] Other (See Comments)    Contraindicated with other medications Patient on tikosyn, and tamiflu interfered with anti arrhythmic med Contraindicated with other medications Patient on tikosyn, and tamiflu interfered with anti arrhythmic med  . Feraheme [Ferumoxytol] Other (See Comments)    Sharp pain to lower back and flank  . Zyrtec [Cetirizine] Other (See Comments)  unspecified  . Lasix [Furosemide] Hives, Swelling and Rash  . Mupirocin Rash    Antimicrobials this admission: Daptomycin 5/26 >>  Ceftriaxone 5/26 >>   Dose adjustments this admission:   Microbiology results: 5/26 BCx:  5/26 UCx:    Thank you for allowing pharmacy to be a part of this patient's care.  Albertina Parr, PharmD., BCPS, BCCCP Clinical  Pharmacist Please refer to [facility] for unit-specific pharmacist

## 2020-06-20 NOTE — ED Triage Notes (Signed)
Pt reports having a fall on Tuesday. Has pain to right shoulder and right hip area. Has redness noted to right neck, recently had dialysis cath placed on 5/20. Temp 100.8 at triage.

## 2020-06-20 NOTE — ED Provider Notes (Signed)
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 [place] Orders ED Discharge Orders    None       Aris Lot, MD 06/20/20 2041    Pattricia Boss, MD 06/20/20 2315
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
## 2020-06-20 NOTE — ED Provider Notes (Signed)
Emergency Medicine Provider Triage Evaluation Note  [name] , a 80 y.o. female  was evaluated in triage.  Pt complains of fall that occurred pta. She is c/o right hip pain. She did hit her head. She further reports chills and pain to the right side of her neck where she has a wound and some erythema to her skin  Review of Systems  Positive: Fall, head injury, right hip pain, wound Negative: No loc  Physical Exam  BP 106/86   Pulse (!) 129   Temp 99 F (37.2 C) (Oral)   Resp (!) 22   SpO2 100%  Gen:   Awake, no distress   Resp:  Normal effort  MSK:   Moves extremities without difficulty  Other:  Wound noted to the right side of the neck with surrounding erythema. No midline cervical spine ttp. ttp to the right hip noted. Clear speech.   Medical Decision Making  Medically screening exam initiated at 4:42 PM.  Appropriate orders placed.  [name] was informed that the remainder of the evaluation will be completed by another provider, this initial triage assessment does not replace that evaluation, and the importance of remaining in the ED until their evaluation is complete.     Rodney Booze, PA-C 06/20/20 1652    Pattricia Boss, MD 06/20/20 [phone]

## 2020-06-20 NOTE — H&P (Signed)
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 was given 1 L fluid bolus in the ED.  Blood pressure now improved, tachycardia resolved, and lactic acidosis improving on repeat labs.  Continue to trend WBC count and lactate.  Blood culture x2 pending.  Vascular surgery will consult in the morning.  ESRD on HD TTS Last HD session was today.  She has peripheral edema but no pulmonary edema on chest x-ray.  Patient was given 1 L fluid bolus in the ED, continue to monitor volume status closely.  Potassium and bicarb within normal range.  Suspicion for bacteremia given recently placed tunneled right IJ dialysis catheter which will have to be replaced.   -Vascular surgery has been consulted.  Consult nephrology in the morning.  Frequent falls No traumatic injuries identified on imaging. -Check CK level.  PT/OT  eval, fall precautions.  Paroxysmal A. Fib/flutter Currently in sinus rhythm. -Hold Eliquis until new dialysis access is established.  Continue amiodarone.  Insulin-dependent type 2 diabetes -Check A1c.  Sliding scale insulin very sensitive ACHS.  Chronic hypoxic respiratory failure Stable.  No change in oxygen requirement from baseline. -Continue supplemental oxygen  Chronic combined CHF Patient was given 1 L fluid bolus in the ED, continue to monitor volume status closely. -Volume management per nephrology  Hyperlipidemia -Continue home Zocor  Hypothyroidism -Continue Synthroid  DVT prophylaxis: SCDs Code Status: Full code.  Discussed with the patient and her daughter at bedside. Family Communication: Daughter updated Disposition Plan: Status is: Inpatient  Remains inpatient appropriate because:Inpatient level of care appropriate due to severity of illness   Dispo: The patient is from: Home              Anticipated d/c is to: Home              Patient currently is not medically stable to d/c.   Difficult to place patient No  Level of care: Level of care: Telemetry Medical   The medical decision making on this patient was of high complexity and the patient is at high risk for clinical deterioration, therefore this is a level 3 visit.  Shela Leff MD [facility]  If 7PM-7AM, please contact night-coverage [email or web address]  06/20/2020, 9:30 PM

## 2020-06-21 ENCOUNTER — Inpatient Hospital Stay (HOSPITAL_COMMUNITY): Payer: Medicare HMO

## 2020-06-21 DIAGNOSIS — R7881 Bacteremia: Secondary | ICD-10-CM

## 2020-06-21 DIAGNOSIS — B9561 Methicillin susceptible Staphylococcus aureus infection as the cause of diseases classified elsewhere: Secondary | ICD-10-CM | POA: Diagnosis not present

## 2020-06-21 DIAGNOSIS — T80211A Bloodstream infection due to central venous catheter, initial encounter: Secondary | ICD-10-CM

## 2020-06-21 DIAGNOSIS — Y712 Prosthetic and other implants, materials and accessory cardiovascular devices associated with adverse incidents: Secondary | ICD-10-CM

## 2020-06-21 LAB — GLUCOSE, CAPILLARY
Glucose-Capillary: 103 mg/dL — ABNORMAL HIGH (ref 70–99)
Glucose-Capillary: 98 mg/dL (ref 70–99)
Glucose-Capillary: 123 mg/dL — ABNORMAL HIGH (ref 70–99)
Glucose-Capillary: 126 mg/dL — ABNORMAL HIGH (ref 70–99)

## 2020-06-21 LAB — BLOOD CULTURE ID PANEL (REFLEXED) - BCID2

## 2020-06-21 LAB — BASIC METABOLIC PANEL
Anion gap: 9 (ref 5–15)
BUN: 10 mg/dL (ref 8–23)
CO2: 30 mmol/L (ref 22–32)
Calcium: 8.1 mg/dL — ABNORMAL LOW (ref 8.9–10.3)
Chloride: 99 mmol/L (ref 98–111)
Creatinine, Ser: 4.08 mg/dL — ABNORMAL HIGH (ref 0.44–1.00)
GFR, Estimated: 11 mL/min — ABNORMAL LOW (ref >60)
Glucose, Bld: 101 mg/dL — ABNORMAL HIGH (ref 70–99)
Potassium: 3.9 mmol/L (ref 3.5–5.1)
Sodium: 138 mmol/L (ref 135–145)

## 2020-06-21 LAB — CK: Total CK: 18 U/L — ABNORMAL LOW (ref 38–234)

## 2020-06-21 LAB — CBC
HCT: 33.3 % — ABNORMAL LOW (ref 36.0–46.0)
Hemoglobin: 10.1 g/dL — ABNORMAL LOW (ref 12.0–15.0)
MCH: 30.8 pg (ref 26.0–34.0)
MCHC: 30.3 g/dL (ref 30.0–36.0)
MCV: 101.5 fL — ABNORMAL HIGH (ref 80.0–100.0)
Platelets: 203 10*3/uL (ref 150–400)
RBC: 3.28 MIL/uL — ABNORMAL LOW (ref 3.87–5.11)
RDW: 16 % — ABNORMAL HIGH (ref 11.5–15.5)
WBC: 13.5 10*3/uL — ABNORMAL HIGH (ref 4.0–10.5)
nRBC: 0 % (ref 0.0–0.2)

## 2020-06-21 LAB — LACTIC ACID, PLASMA
Lactic Acid, Venous: 2.4 mmol/L (ref 0.5–1.9)
Lactic Acid, Venous: 1.8 mmol/L (ref 0.5–1.9)
Lactic Acid, Venous: 1.7 mmol/L (ref 0.5–1.9)

## 2020-06-21 LAB — HEMOGLOBIN A1C
Hgb A1c MFr Bld: 5.5 % (ref 4.8–5.6)
Mean Plasma Glucose: 111 mg/dL

## 2020-06-21 MED ORDER — CHLORHEXIDINE GLUCONATE CLOTH 2 % EX PADS
6.0000 | MEDICATED_PAD | Freq: Every day | CUTANEOUS | Status: DC
  Administered 2020-06-21 – 2020-06-29 (×9): 6 via TOPICAL

## 2020-06-21 MED ORDER — CEFAZOLIN SODIUM-DEXTROSE 1-4 GM/50ML-% IV SOLN
1.0000 g | Freq: Every day | INTRAVENOUS | Status: AC
  Administered 2020-06-21 – 2020-06-28 (×8): 1 g via INTRAVENOUS
  Filled 2020-06-21 (×11): qty 50

## 2020-06-21 MED ORDER — SODIUM CHLORIDE 0.9 % IV BOLUS
500.0000 mL | Freq: Once | INTRAVENOUS | Status: AC
  Administered 2020-06-21: 500 mL via INTRAVENOUS

## 2020-06-21 MED ORDER — FAMOTIDINE 20 MG PO TABS
20.0000 mg | ORAL_TABLET | Freq: Every day | ORAL | Status: DC
  Administered 2020-06-21 – 2020-06-28 (×8): 20 mg via ORAL
  Filled 2020-06-21 (×8): qty 1

## 2020-06-21 NOTE — Progress Notes (Signed)
Set Dr. Sloan Leiter a chat message to please reach out to patient's daughter, Heinz Knuckles at [phone] for an update the patient.

## 2020-06-21 NOTE — Progress Notes (Signed)
PROGRESS NOTE    [name]  PRF:[id number] DOB: March 07, 1940 DOA: 06/20/2020 PCP: Wannetta Sender, FNP    Brief Narrative:  80 year old female with extensive medical issues, ESRD on hemodialysis TTS schedule, paroxysmal A. fib on Eliquis, chronic respiratory failure on home oxygen, obesity, coronary artery disease, combined chronic heart failure, type 2 diabetes, hypertension, hyperlipidemia, hypothyroidism, diverticulosis and multiple comorbidities recently underwent tunneled IJ catheter placement on 5/20 due to occlusion of left femoral dialysis catheter.  She has frequent fall.  Also fell on 5/21.  Was complaining of right neck pain after catheter placement.  Continue to complain of back pain and right hip pain so came to ER. In the emergency room febrile and tachycardic on arrival.  Had received dialysis on 5/26 the day of admission.  COVID-19 negative.  Chest x-ray with streaky opacity left lung base.  Head CT negative.  X-ray of the right hip and pelvis without any injuries.  Started on daptomycin and ceftriaxone after blood cultures and admit to the hospital.  Right tunneled IJ catheter was obviously infected on presentation. Blood cultures growing MSSA on all culture bottles.  Assessment & Plan:   Principal Problem:   MSSA bacteremia Active Problems:   Hypothyroidism   Hyperlipidemia   Atrial fibrillation ([place])   ESRD (end stage renal disease) ([place])  Severe sepsis secondary to MSSA bacteremia, infected indwelling tunneled dialysis catheter: Treated with daptomycin and Rocephin.  Followed by ID. Hemodynamically stabilizing.  Will order TEE today. Vascular surgery on board, anticipate removal of catheter today.  ESRD on HD: TTS schedule . Received HD 5/26. Will need a line break. Can probably have HD catheter removed today . May need temp catheter tomorrow for HD and a line holiday. Will consult Nephrology.  Paroxysmal atrial fibrillation: Patient currently in sinus  rhythm.  Eliquis on hold.  On amiodarone.  Type 2 diabetes on insulin: Remains on sliding scale.  Stable.  Chronic respiratory failure: No change in oxygen requirement.  Debility and frailty/frequent falls: Work with PT OT.  Lives at home with family.  Overall with very poor mobility.   DVT prophylaxis: Place and maintain sequential compression device Start: 06/20/20 2134   Code Status: Full code Family Communication: Daughter on the phone called and updated. Disposition Plan: Status is: Inpatient  Remains inpatient appropriate because:Ongoing diagnostic testing needed not appropriate for outpatient work up and Inpatient level of care appropriate due to severity of illness   Dispo: The patient is from: Home              Anticipated d/c is to: Home              Patient currently is not medically stable to d/c.   Difficult to place patient No         Consultants:  Vascular surgery Nephrology  Procedures:   None  Antimicrobials:   Daptomycin and Rocephin 5/26--     Subjective: Patient seen and examined.  Today morning she feels okay.  She had sleep last night because of being in the hospital.  Denies any chest pain or shortness of breath.  Afebrile.  Objective: Vitals:   06/21/20 0254 06/21/20 0444 06/21/20 0805 06/21/20 1100  BP: (!) 101/52 (!) 98/44 (!) 102/50 (!) 121/51  Pulse: 71 70 64 67  Resp: 20 20 15 19   Temp: 99.7 F (37.6 C) 99.8 F (37.7 C) 98.5 F (36.9 C) 100 F (37.8 C)  TempSrc: Oral Oral Oral Axillary  SpO2: 100% 98%  100% 100%  Weight: 99.1 kg     Height: 5\' 3"  (1.6 m)       Intake/Output Summary (Last 24 hours) at 06/21/2020 1422 Last data filed at 06/21/2020 0600 Gross per 24 hour  Intake 120 ml  Output 50 ml  Net 70 ml   Filed Weights   06/20/20 2044 06/21/20 0254  Weight: 99.5 kg 99.1 kg    Examination:  General exam: Appears calm and comfortable  Currently on 2 L oxygen that she uses at home.  Frail and debilitated.  Not  in any distress. Respiratory system: Clear to auscultation. Respiratory effort normal.  Mostly bilateral clear. Cardiovascular system: S1 & S2 heard, RRR.  2+ bilateral pedal edema. Right tunneled IJ catheter with erythema and surrounding tenderness. Gastrointestinal system: Abdomen is nondistended, soft and nontender. No organomegaly or masses felt. Normal bowel sounds heard. Patient has ostomy bag with brown liquidy stool. Central nervous system: Alert and oriented. No focal neurological deficits.  Generalized weakness.    Data Reviewed: I have personally reviewed following labs and imaging studies  CBC: Recent Labs  Lab 06/15/20 1349 06/20/20 1649 06/21/20 0226  WBC 5.9 13.1* 13.5*  NEUTROABS  --  11.1*  --   HGB 10.2* 11.5* 10.1*  HCT 34.0* 37.4 33.3*  MCV 101.8* 100.0 101.5*  PLT 200 230 010   Basic Metabolic Panel: Recent Labs  Lab 06/15/20 1349 06/20/20 1649 06/21/20 0226  NA 137 139 138  K 4.7 3.7 3.9  CL 101 99 99  CO2 25 30 30   GLUCOSE 187* 140* 101*  BUN 20 9 10   CREATININE 5.84* 3.46* 4.08*  CALCIUM 8.5* 8.0* 8.1*   GFR: Estimated Creatinine Clearance: 13 mL/min (A) (by C-G formula based on SCr of 4.08 mg/dL (H)). Liver Function Tests: Recent Labs  Lab 06/20/20 1649  AST 30  ALT 17  ALKPHOS 121  BILITOT 1.1  PROT 6.6  ALBUMIN 2.9*   No results for input(s): LIPASE, AMYLASE in the last 168 hours. No results for input(s): AMMONIA in the last 168 hours. Coagulation Profile: Recent Labs  Lab 06/20/20 1649  INR 1.6*   Cardiac Enzymes: Recent Labs  Lab 06/21/20 0226  CKTOTAL 18*   BNP (last 3 results) No results for input(s): PROBNP in the last 8760 hours. HbA1C: No results for input(s): HGBA1C in the last 72 hours. CBG: Recent Labs  Lab 06/20/20 2143 06/21/20 0810 06/21/20 1159  GLUCAP 111* 103* 98   Lipid Profile: No results for input(s): CHOL, HDL, LDLCALC, TRIG, CHOLHDL, LDLDIRECT in the last 72 hours. Thyroid Function  Tests: No results for input(s): TSH, T4TOTAL, FREET4, T3FREE, THYROIDAB in the last 72 hours. Anemia Panel: No results for input(s): VITAMINB12, FOLATE, FERRITIN, TIBC, IRON, RETICCTPCT in the last 72 hours. Sepsis Labs: Recent Labs  Lab 06/20/20 2209 06/21/20 0226 06/21/20 0714 06/21/20 1024  LATICACIDVEN 3.7* 2.4* 1.8 1.7    Recent Results (from the past 240 hour(s))  Blood culture (routine x 2)     Status: None (Preliminary result)   Collection Time: 06/20/20  4:55 PM   Specimen: BLOOD RIGHT HAND  Result Value Ref Range Status   Specimen Description BLOOD RIGHT HAND  Final   Special Requests   Final    BOTTLES DRAWN AEROBIC AND ANAEROBIC Blood Culture results may not be optimal due to an inadequate volume of blood received in culture bottles   Culture  Setup Time   Final    GRAM POSITIVE COCCI IN CLUSTERS IN BOTH  AEROBIC AND ANAEROBIC BOTTLES CRITICAL VALUE NOTED.  VALUE IS CONSISTENT WITH PREVIOUSLY REPORTED AND CALLED VALUE. Performed at [facility], [place] [address] [place], [place] [address]    Culture GRAM POSITIVE COCCI  Final   Report Status PENDING  Incomplete  Resp Panel by RT-PCR (Flu A&B, Covid) Nasopharyngeal Swab     Status: None   Collection Time: 06/20/20  5:51 PM   Specimen: Nasopharyngeal Swab; Nasopharyngeal(NP) swabs in vial transport medium  Result Value Ref Range Status   SARS Coronavirus 2 by RT PCR NEGATIVE NEGATIVE Final    Comment: (NOTE) SARS-CoV-2 target nucleic acids are NOT DETECTED.  The SARS-CoV-2 RNA is generally detectable in upper respiratory specimens during the acute phase of infection. The lowest concentration of SARS-CoV-2 viral copies this assay can detect is 138 copies/mL. A negative result does not preclude SARS-Cov-2 infection and should not be used as the sole basis for treatment or other patient management decisions. A negative result may occur with  improper specimen collection/handling, submission of specimen  other than nasopharyngeal swab, presence of viral mutation(s) within the areas targeted by this assay, and inadequate number of viral copies(<138 copies/mL). A negative result must be combined with clinical observations, patient history, and epidemiological information. The expected result is Negative.  Fact Sheet for Patients:  EntrepreneurPulse.com.au  Fact Sheet for Healthcare Providers:  IncredibleEmployment.be  This test is no t yet approved or cleared by the [place] FDA and  has been authorized for detection and/or diagnosis of SARS-CoV-2 by FDA under an Emergency Use Authorization (EUA). This EUA will remain  in effect (meaning this test can be used) for the duration of the COVID-19 declaration under Section 564(b)(1) of the Act, 21 U.S.C.section 360bbb-3(b)(1), unless the authorization is terminated  or revoked sooner.       Influenza A by PCR NEGATIVE NEGATIVE Final   Influenza B by PCR NEGATIVE NEGATIVE Final    Comment: (NOTE) The Xpert Xpress SARS-CoV-2/FLU/RSV plus assay is intended as an aid in the diagnosis of influenza from Nasopharyngeal swab specimens and should not be used as a sole basis for treatment. Nasal washings and aspirates are unacceptable for Xpert Xpress SARS-CoV-2/FLU/RSV testing.  Fact Sheet for Patients: EntrepreneurPulse.com.au  Fact Sheet for Healthcare Providers: IncredibleEmployment.be  This test is not yet approved or cleared by the [place] FDA and has been authorized for detection and/or diagnosis of SARS-CoV-2 by FDA under an Emergency Use Authorization (EUA). This EUA will remain in effect (meaning this test can be used) for the duration of the COVID-19 declaration under Section 564(b)(1) of the Act, 21 U.S.C. section 360bbb-3(b)(1), unless the authorization is terminated or revoked.  Performed at [facility], [place] [address], [place],  [place] [address]   Blood culture (routine x 2)     Status: None (Preliminary result)   Collection Time: 06/20/20  6:25 PM   Specimen: Right Antecubital; Blood  Result Value Ref Range Status   Specimen Description RIGHT ANTECUBITAL  Final   Special Requests   Final    BOTTLES DRAWN AEROBIC ONLY Blood Culture adequate volume   Culture  Setup Time   Final    GRAM POSITIVE COCCI IN CLUSTERS AEROBIC BOTTLE ONLY CRITICAL RESULT CALLED TO, READ BACK BY AND VERIFIED WITH: PHARMD M.YATES AT 8676 ON 06/21/2020 BY T.SAAD. Performed at [facility], [place] [address] [place], [place] [address]    Culture [facility] POSITIVE COCCI  Final   Report Status PENDING  Incomplete  Radiology Studies: DG Chest 1 View  Result Date: 06/20/2020 CLINICAL DATA:  Fall. EXAM: CHEST  1 VIEW COMPARISON:  06/15/2020 FINDINGS: 1936 hours. Low lung volumes. The cardio pericardial silhouette is enlarged. Right IJ central line tip overlies the right atrium. Streaky opacity at the left base suggest atelectasis. Bones are demineralized. Telemetry leads overlie the chest. IMPRESSION: Low volume film with left base atelectasis. Electronically Signed   By: Misty Stanley M.D.   On: 06/20/2020 19:41   DG Lumbar Spine 2-3 Views  Result Date: 06/20/2020 CLINICAL DATA:  Fall 2 days ago.  Low back pain.  Initial encounter. EXAM: LUMBAR SPINE - 2-3 VIEW COMPARISON:  None. FINDINGS: There is no evidence of lumbar spine fracture. Mild degenerative disc disease is seen at L4-5, facet DJD is seen at L4-5 and L5-S1. Grade 1 anterolisthesis is seen at L4-5 measuring 4 mm, which is attributable to degenerative changes at this level. No focal lytic or sclerotic bone lesions identified. Aortic atherosclerotic calcification noted. IMPRESSION: No acute findings. Lower lumbar degenerative spondylosis, with grade 1 anterolisthesis at L4-5. Electronically Signed   By: Marlaine Hind M.D.   On: 06/20/2020 19:42   CT Head Wo Contrast  Result Date:  06/20/2020 CLINICAL DATA:  Fall.  Head trauma. EXAM: CT HEAD WITHOUT CONTRAST TECHNIQUE: Contiguous axial images were obtained from the base of the skull through the vertex without intravenous contrast. COMPARISON:  05/15/2013 FINDINGS: Brain: There is no evidence for acute hemorrhage, hydrocephalus, mass lesion, or abnormal extra-axial fluid collection. No definite CT evidence for acute infarction. Diffuse loss of parenchymal volume is consistent with atrophy. Patchy low attenuation in the deep hemispheric and periventricular white matter is nonspecific, but likely reflects chronic microvascular ischemic demyelination. Calcification in the posterior left cerebral hemisphere is stable consistent with benign etiology, potentially vascular malformation. Vascular: No hyperdense vessel or unexpected calcification. Skull: No evidence for fracture. No worrisome lytic or sclerotic lesion. Sinuses/Orbits: The visualized paranasal sinuses and mastoid air cells are clear. Visualized portions of the globes and intraorbital fat are unremarkable. Other: None. IMPRESSION: 1. Stable exam.  No acute intracranial abnormality. Electronically Signed   By: Misty Stanley M.D.   On: 06/20/2020 19:56   DG Hip Unilat W or Wo Pelvis 2-3 Views Right  Result Date: 06/20/2020 CLINICAL DATA:  Fall 2 days ago. Right hip pain. Initial encounter. EXAM: DG HIP (WITH OR WITHOUT PELVIS) 2-3V RIGHT COMPARISON:  None. FINDINGS: There is no evidence of hip fracture or dislocation. There is no evidence of arthropathy or other focal bone abnormality. Generalized osteopenia noted. IMPRESSION: No acute findings. Osteopenia. Electronically Signed   By: Marlaine Hind M.D.   On: 06/20/2020 19:43        Scheduled Meds: . amiodarone  200 mg Oral Daily  . Chlorhexidine Gluconate Cloth  6 each Topical Daily  . clonazePAM  1 mg Oral Daily  . estradiol  1 mg Oral Daily  . famotidine  20 mg Oral Q2000  . insulin aspart  0-5 Units Subcutaneous QHS  .  insulin aspart  0-6 Units Subcutaneous TID WC  . levothyroxine  175 mcg Oral QAC breakfast  . simvastatin  10 mg Oral QHS   Continuous Infusions: . cefTRIAXone (ROCEPHIN)  IV    . DAPTOmycin (CUBICIN)  IV    . [START ON 06/22/2020] DAPTOmycin (CUBICIN)  IV Stopped (06/20/20 2328)     LOS: 1 day    Time spent: 35 minutes    Barb Merino, MD [facility] Pager  [phone] 

## 2020-06-21 NOTE — Progress Notes (Signed)
Echo attempted. Patient requested that she be allowed to finish eating. Will attempt again later as time allows.

## 2020-06-21 NOTE — Consult Note (Addendum)
I have seen and examined the patient. I have personally reviewed the clinical findings, laboratory findings, microbiological data and imaging studies. The assessment and treatment plan was discussed with the  Advance Practice Provider.  I agree with her/his recommendations except following additions/corrections.  High Grade MSSA bacteremia, CLABSI in the setting of rt IJ HD catheter. Rt IJ catheter  removed by [facility] today  She has a Left thigh AVG placed on 06/15/20, does not appear to be infected  No other hardware She complained of back pain and rt hip pain on admission in the setting of fall. But did not complain back or hip pain today. Xray L spine and Rt hip with no acute findings   Repeat 2 sets of blood cultures after catheter removal  TTE Line holiday given staph bacteremia [place] chest given severe erythema and tenderness in the rt chest to r/o deeper infection Monitor back pain and rt hip joint for need for worsening and need to image Monitor CBC and BMP on IV antibiotics.   Dr Baxter Flattery will check on her over the long weekend. I will be back on Tuesday.   Allison Oz, MD [name]    Date of Admission:  06/20/2020      Total days of antibiotics 1   Daptomycin + Ceftriaxone              Reason for Consult: Staph aureus bacteremia     Referring Provider: Johnnye [name] consultation  Primary Care Provider: Wannetta Sender, FNP   Assessment: [name] is a 80 y.o. female with multiple medical problems including recent laparoscopic colectomy and ileostomy (April 2022) and HD line insertion (5/20) here with weakness, falls at home. She had leukocytosis and fevers at presentation. Found to have high grade bacteremia with gram positive cocci growing in all 3 collected bottles.   MSSA Bacteremia = Erythema and tenderness over recently inserted HD line with obvious  concern for line infection. Vascular team is following and planning removal and holiday - agree and appreciate help. Will plan repeating blood cultures after line in the morning after her line is removed.  TTE is already pending as well. Her lactic acidosis has resolved and she does feel better. No findings of metastatic infection at this time. She is complaining of some low back pain acutely worsened after her falls recently. Not severe just noticeable.   Multiple allergies = seems she has tolerated cefazolin in April as part of surgical prophylaxis with colectomy. Will narrow to cefazolin to her bacteremia.   Falls = baseline CK 18.   Plan: 1. Switch to cefazolin  2. Follow blood cultures for identification of GPC and susceptibilities  3. Repeat blood cultures tomorrow / after line is removed  4. TTE pending - follow results  5. Follow back pain/Rt hip pain    Principal Problem:   Severe sepsis ([place]) Active Problems:   Hypothyroidism   Hyperlipidemia   Atrial fibrillation ([place])   ESRD (end stage renal disease) ([place])   . amiodarone  200 mg Oral Daily  . Chlorhexidine Gluconate Cloth  6 each Topical Daily  . clonazePAM  1 mg Oral Daily  . estradiol  1 mg Oral Daily  . famotidine  20 mg Oral Q2000  . insulin aspart  0-5 Units Subcutaneous QHS  . insulin aspart  0-6 Units Subcutaneous TID WC  .  levothyroxine  175 mcg Oral QAC breakfast  . simvastatin  10 mg Oral QHS    HPI: [name] is a 80 y.o. female who presented to the ER on 5/21 after her dialysis session and a recent fall.   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 Multiple high grade allergies and intolerances of medications and foods.   Her daughter and son are in the room at the interview. They have noticed increased weakness for over a week.  R HD catheter recently  placed on 5/20 after having trouble with left femoral fistula malfunction; they noticed some erythema Monday and have found this to rapidly spread over the last few days. She has had several falls recently, one with minor injury to the R anterior knee after she got her foot stuck in a car. [name] does report some low back pain that is a little worse than normal after her falls. No pacemaker, or intravascular hardware aside from HD line. She does have a loop recorder that is no longer working in the left chest that has not caused any problems for her.   She was noted by EDP to have erythema to the right side of her neck and upper right chest where HD line was placed. She was started empirically on IV daptomycin (vancomycin allergy) and ceftriaxone. In the mean time her blood cultures have returned with MSSA in [place]  Vascular has also been consulted with concern over infected HD line - planning for removal of catheter with line holiday. It looks like she received Cefazolin recently during line removal at [facility]. Ms. Bruyere and her daughter both verify that she did very well with the IV antibiotics she received at that surgery and there was no concern for allergic reaction.     Review of Systems: Fevers, chills, malaise and generalised weakness Denies nausea/vomiting, abdominal pain and diarrhea Denies GU symptoms Denies cough, chest pain and SOB  Past Medical History:  Diagnosis Date  . Adenomatous colon polyp 02/13/09  . Allergy    april- september   . Anemia   . Anxiety   . Asthma   . Atrial flutter ([place])    a. By ILR interrogation.  . Bell's palsy 2013  . 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 01/2015 s/p robot assisted lap nephroureterectomy, lysis of adhesions.  . Chronic diastolic CHF (congestive heart failure) ([place])    a. 12/2012 Echo: EF 45%, grade 3 DD; b. 08/2014 TEE: EF 55%.  . CKD (chronic kidney disease), stage III ([place])   . Complication of anesthesia    difficult to awaken ,  N/V  . COVID-19 01/30/2020  . Degenerative disc disease, cervical   . Dementia ([place])   . Depression   . Diabetes mellitus without complication ([place])    Type II  . Diverticulitis   . Dyspnea    shortness of breath, wears oxygen 2- 2.5 liters  . Dysrhythmia    PAF  . End stage renal disease [facility])    T/Th/ [address]  . Family history of adverse reaction to anesthesia    Father - N/V  . Gastroparesis   . GERD (gastroesophageal reflux disease)   . Gout   . Headache   . Hematoma 07/2015   post Nephrectomy  . Hiatal hernia   . History of blood transfusion   .  History of kidney stones    passed  . HOH (hard of hearing)   . Hyperlipidemia   . Hypertension   . Hypothyroidism   . IBS (irritable bowel syndrome)   . Myocardial infarction ([place])    2014  . Neuropathy of both feet   . NICM (nonischemic cardiomyopathy) ([place])    a. 12/2012 Echo: EF 45% with grade 3 DD;  b. 08/2014 TEE: EF 55%, no rwma, mod RAE, mod-sev LAE, triv MR/TR, No LAA thrombus, no PFO/ASD, Grade III plaque in desc thoracic Ao.  . Non-obstructive CAD    a. 12/2012 Cath: LM nl, LAD 50p/m, LCX 50-41m (FFR 0.93), RCA min irregs, EF 55-65%-->Med Rx. b. L&RHC 03/21/2014 EF 50-55%, 50% eccentric LCx stenosis with negative FFR, 40% ostial RCA stenosis, 40-50% mid LAD stenosis   . Obesity (BMI 30-39.9)   . Osteoarthritis   . Oxygen deficiency    uses 2-3 liters 02   . Oxygen dependent    a. patient uses 1l at rest and 2L with exertion   . PAF (paroxysmal atrial fibrillation) ([place])    a. 2015 - was on tikosyn but developed QT prolongation and torsades in setting of azithromycin-->tikosyn d/c'd, later switched to [facility] 08/2014;  b. 08/2014 s/p AF RFCA;  c. 11/2014 Amio reduced to 100mg  QD;  d. CHA2DS2VASc = 6-->chronic xarelto, reduced to 15mg  QD 02/2014 in setting of CKD/nephrectomy.  Marland Kitchen PONV (postoperative nausea and vomiting)   . PONV (postoperative nausea and vomiting)   . Sleep apnea    pt scored 5 per stop bang tool  per PAT visit 02/14/2015; results sent to PCP Dr Melina Copa   . Status post dilation of esophageal narrowing   . Syncope    a. 12/2012: MDT Reveal LINQ ILR placed;  b. 12/2012 Echo: EF 45-50%, Gr 3 DD, mild MR, mildly dil LA;  c. 12/2012 Carotid U/S: 1-39% bilat ICA stenosis.  . Vitamin D deficiency   . Wears glasses     Social History   Tobacco Use  . Smoking status: Never Smoker  . Smokeless tobacco: Never Used  Vaping Use  . Vaping Use: Never used  Substance Use Topics  . Alcohol use: No  . Drug use: No    Family History  Problem Relation Age of Onset  . Heart attack Mother   . Diabetes Mother   . Colon cancer Father   . Esophageal cancer Father   . Kidney cancer Father   . Diabetes Father   . Ovarian cancer Sister   . Liver cancer Sister   . Breast cancer Sister   . Colon cancer Son   . Colon polyps Son   . Diabetes Sister   . Irritable bowel syndrome Sister   . Myocarditis Brother   . Rectal cancer Neg Hx   . Stomach cancer Neg Hx    Allergies  Allergen Reactions  . Adhesive [Tape] Itching, Swelling, Rash and Other (See Comments)    Tears skin and causes blisters also. EKG pads will cause welps.   . Avelox [Moxifloxacin] Swelling and Rash  . Blueberry Flavor Anaphylaxis  . Cefprozil Shortness Of Breath and Rash    Tolerated ceftriaxone on 06/20/20  . Cetacaine [Butamben-Tetracaine-Benzocaine] Nausea And Vomiting and Swelling  . Dicyclomine Nausea And Vomiting and Other (See Comments)    "Heart trouble"; Headaches and increased blood sugars "Heart trouble"; Headaches and increased blood sugars  . Food Anaphylaxis and Other (See Comments)    Melons, Bananas, Cantaloupes, Watermelon-throat closes up  and blisters   . Imdur [Isosorbide Nitrate] Hives, Palpitations, Other (See Comments) and Rash    Headaches also  . Januvia [Sitagliptin] Shortness Of Breath  . Lipitor [Atorvastatin] Shortness Of Breath  . Losartan Potassium Shortness Of Breath  . Nitroglycerin Other  (See Comments)    Caused cardiac arrest and feels like skin bring torn off back of head Caused cardiac arrest and feels like skin bring torn off back of head  . Omeprazole Shortness Of Breath and Swelling  . Oxycodone Hives and Rash    Tolerates Dilaudid Tolerates Dilaudid  . Penicillins Anaphylaxis    Has patient had a PCN reaction causing immediate rash, facial/tongue/throat swelling, SOB or lightheadedness with hypotension: Yes Has patient had a PCN reaction causing severe rash involving mucus membranes or skin necrosis: No Has patient had a PCN reaction that required hospitalization Yes Has patient had a PCN reaction occurring within the last 10 years: No If all of the above answers are "NO", then may proceed with Cephalosporin use. Has patient had a PCN reaction causing immediate rash, facial/tongue/throat swelling, SOB or lightheadedness with hypotension: Yes Has patient had a PCN reaction causing severe rash involving mucus membranes or skin necrosis: No Has patient had a PCN reaction that required hospitalization Yes Has patient had a PCN reaction occurring within the last 10 years: No If all of the above answers are "NO", then may proceed with Cephalosporin use.   . Prednisone Anaphylaxis  . Vancomycin Anaphylaxis  . Hydrocodone Hives    Tolerates Dilaudid  . Latex Other (See Comments) and Rash    blisters  . Tamiflu [Oseltamivir] Other (See Comments)    Contraindicated with other medications Patient on tikosyn, and tamiflu interfered with anti arrhythmic med Contraindicated with other medications Patient on tikosyn, and tamiflu interfered with anti arrhythmic med  . Feraheme [Ferumoxytol] Other (See Comments)    Sharp pain to lower back and flank  . Zyrtec [Cetirizine] Other (See Comments)    unspecified  . Lasix [Furosemide] Hives, Swelling and Rash  . Mupirocin Rash    OBJECTIVE: Blood pressure (!) 102/50, pulse 64, temperature 98.5 F (36.9 C), temperature source  Oral, resp. rate 15, height 5\' 3"  (1.6 m), weight 99.1 kg, SpO2 100 %.  Morbidly obese female lying in bed Chronically ill appearing Normocephalic and atraumatic  RT neck and chest - erythema and tenderness, Rt permacath site- tenderness Chest - clear lung fields CVS- Normal s1s2, muffled heart sounds Abdomen - soft, large abdomen , ostomy with greenish fluid Extremities -abrasions in the rt anterior leg   Lab Results Lab Results  Component Value Date   WBC 13.5 (H) 06/21/2020   HGB 10.1 (L) 06/21/2020   HCT 33.3 (L) 06/21/2020   MCV 101.5 (H) 06/21/2020   PLT 203 06/21/2020    Lab Results  Component Value Date   CREATININE 4.08 (H) 06/21/2020   BUN 10 06/21/2020   NA 138 06/21/2020   K 3.9 06/21/2020   CL 99 06/21/2020   CO2 30 06/21/2020    Lab Results  Component Value Date   ALT 17 06/20/2020   AST 30 06/20/2020   ALKPHOS 121 06/20/2020   BILITOT 1.1 06/20/2020     Microbiology: Recent Results (from the past 240 hour(s))  Blood culture (routine x 2)     Status: None (Preliminary result)   Collection Time: 06/20/20  4:55 PM   Specimen: BLOOD RIGHT HAND  Result Value Ref Range Status   Specimen Description BLOOD RIGHT  HAND  Final   Special Requests   Final    BOTTLES DRAWN AEROBIC AND ANAEROBIC Blood Culture results may not be optimal due to an inadequate volume of blood received in culture bottles   Culture  Setup Time   Final    GRAM POSITIVE COCCI IN CLUSTERS IN BOTH AEROBIC AND ANAEROBIC BOTTLES CRITICAL VALUE NOTED.  VALUE IS CONSISTENT WITH PREVIOUSLY REPORTED AND CALLED VALUE. Performed at [facility], [place] [address], [place], [place] [address]    Culture GRAM POSITIVE COCCI  Final   Report Status PENDING  Incomplete  Resp Panel by RT-PCR (Flu A&B, Covid) Nasopharyngeal Swab     Status: None   Collection Time: 06/20/20  5:51 PM   Specimen: Nasopharyngeal Swab; Nasopharyngeal(NP) swabs in vial transport medium  Result Value Ref Range Status    SARS Coronavirus 2 by RT PCR NEGATIVE NEGATIVE Final    Comment: (NOTE) SARS-CoV-2 target nucleic acids are NOT DETECTED.  The SARS-CoV-2 RNA is generally detectable in upper respiratory specimens during the acute phase of infection. The lowest concentration of SARS-CoV-2 viral copies this assay can detect is 138 copies/mL. A negative result does not preclude SARS-Cov-2 infection and should not be used as the sole basis for treatment or other patient management decisions. A negative result may occur with  improper specimen collection/handling, submission of specimen other than nasopharyngeal swab, presence of viral mutation(s) within the areas targeted by this assay, and inadequate number of viral copies(<138 copies/mL). A negative result must be combined with clinical observations, patient history, and epidemiological information. The expected result is Negative.  Fact Sheet for Patients:  EntrepreneurPulse.com.au  Fact Sheet for Healthcare Providers:  IncredibleEmployment.be  This test is no t yet approved or cleared by the [place] FDA and  has been authorized for detection and/or diagnosis of SARS-CoV-2 by FDA under an Emergency Use Authorization (EUA). This EUA will remain  in effect (meaning this test can be used) for the duration of the COVID-19 declaration under Section 564(b)(1) of the Act, 21 U.S.C.section 360bbb-3(b)(1), unless the authorization is terminated  or revoked sooner.       Influenza A by PCR NEGATIVE NEGATIVE Final   Influenza B by PCR NEGATIVE NEGATIVE Final    Comment: (NOTE) The Xpert Xpress SARS-CoV-2/FLU/RSV plus assay is intended as an aid in the diagnosis of influenza from Nasopharyngeal swab specimens and should not be used as a sole basis for treatment. Nasal washings and aspirates are unacceptable for Xpert Xpress SARS-CoV-2/FLU/RSV testing.  Fact Sheet for  Patients: EntrepreneurPulse.com.au  Fact Sheet for Healthcare Providers: IncredibleEmployment.be  This test is not yet approved or cleared by the [place] FDA and has been authorized for detection and/or diagnosis of SARS-CoV-2 by FDA under an Emergency Use Authorization (EUA). This EUA will remain in effect (meaning this test can be used) for the duration of the COVID-19 declaration under Section 564(b)(1) of the Act, 21 U.S.C. section 360bbb-3(b)(1), unless the authorization is terminated or revoked.  Performed at [facility], [place] [address] [place], [place] [address]   Blood culture (routine x 2)     Status: None (Preliminary result)   Collection Time: 06/20/20  6:25 PM   Specimen: Right Antecubital; Blood  Result Value Ref Range Status   Specimen Description RIGHT ANTECUBITAL  Final   Special Requests   Final    BOTTLES DRAWN AEROBIC ONLY Blood Culture adequate volume   Culture  Setup Time   Final  GRAM POSITIVE COCCI IN CLUSTERS AEROBIC BOTTLE ONLY CRITICAL RESULT CALLED TO, READ BACK BY AND VERIFIED WITH: PHARMD M.YATES AT [id number] ON 06/21/2020 BY T.SAAD. Performed at [facility], [place] [address], [place], [place] [address]    Culture GRAM POSITIVE COCCI  Final   Report Status PENDING  Incomplete     Janene Madeira, MSN, NP-C [place] [facility] [email or web address] Pager: [phone] Office: [phone] [place]: [phone]

## 2020-06-21 NOTE — Progress Notes (Signed)
PT Cancellation Note  Patient Details Name: [name] MRN: [id number] DOB: [birth year]   Cancelled Treatment:    Reason Eval/Treat Not Completed: Other (comment) other skilled services working with patient. Will attempt to return later if time/schedule allow.    Windell Norfolk, DPT, PN1   Supplemental Physical Therapist [facility]    Pager [phone] Acute Rehab Office [phone]

## 2020-06-21 NOTE — Progress Notes (Signed)
PHARMACY - PHYSICIAN COMMUNICATION CRITICAL VALUE ALERT - BLOOD CULTURE IDENTIFICATION (BCID)  CLOMA RAHRIG is an 80 y.o. female who presented to [facility] on 06/20/2020 with a chief complaint of fevers with HD yesterday.   Assessment:  3/3 BCx GPCs, BCID Staph aureus, no resistance detected. Likely source new HD catheter placed 5 days ago.   Name of physician (or Provider) Contacted: Dr. Sloan Leiter. ID team also aware  Current antibiotics: ceftriaxone and daptomycin  Changes to prescribed antibiotics recommended:  ID consulted and will adjust   No results found for this or any previous visit.  Rebbeca Paul, PharmD PGY1 Pharmacy Resident 06/21/2020 11:36 AM  Please check [email or web address] for unit-specific pharmacy phone numbers.

## 2020-06-21 NOTE — Evaluation (Signed)
Occupational Therapy Evaluation Patient Details Name: [name] MRN: [id number] DOB: March 20, 1940 Today's Date: 06/21/2020    History of Present Illness 80yo female admitted 06/20/20 who recently had a R IJ HD cath placed on 06/14/20 secondary to occlusion of L femoral HD catheter; she then had a fall on 06/15/20. Negative for fractures, CTH clear. Found to be in severe sepsis with cellulitis around R IJ cath. PMH anxiety, A-fib, bell's palsy, pelvic CA, CHF, CKD, hx covid, DM, ESRD on HD, gout, HLD, HTN, DM with neuropathy, syncope, cervical surgery, trigger finger release   Clinical Impression   Pt was ambulating with a rollator at home with her son and was assisted with some aspects of ADL and all IADL. Pt presents with generalized weakness, pain and impaired cognition. Evaluation was limited to bed level as pt refused EOB or OOB despite encouragement. Per daughter, this is not pt's typical behavior. Pt with hx of multiple recent falls. Recommending SNF for further rehab prior to return home.     Follow Up Recommendations  SNF;Supervision/Assistance - 24 hour    Equipment Recommendations  None recommended by OT    Recommendations for Other Services       Precautions / Restrictions Precautions Precautions: Fall;Other (comment) Precaution Comments: frequent falls, chronic femoral cath/new R IJ cath, on O2 at baseline, claustrophobic Restrictions Weight Bearing Restrictions: No      Mobility Bed Mobility               General bed mobility comments: refused    Transfers                 General transfer comment: refused    Balance                                           ADL either performed or assessed with clinical judgement   ADL Overall ADL's : Needs assistance/impaired Eating/Feeding: NPO   Grooming: Bed level;Set up   Upper Body Bathing: Total assistance;Bed level   Lower Body Bathing: Total assistance;Bed level   Upper Body  Dressing : Maximal assistance;Bed level   Lower Body Dressing: Total assistance;Bed level       Toileting- Clothing Manipulation and Hygiene: Total assistance;Bed level               Vision Patient Visual Report: No change from baseline       Perception     Praxis      Pertinent Vitals/Pain Pain Assessment: Faces Faces Pain Scale: Hurts little more Pain Location: "it hurts from the waist down" Pain Descriptors / Indicators: Discomfort Pain Intervention(s): Monitored during session;Repositioned     Hand Dominance Right   Extremity/Trunk Assessment Upper Extremity Assessment Upper Extremity Assessment: Generalized weakness;LUE deficits/detail;RUE deficits/detail RUE Deficits / Details: arthritic changes in hand RUE Coordination: decreased fine motor LUE Deficits / Details: numbness on radial side from prior placement of HD graft per pt, arthritic changes in hand LUE Coordination: decreased fine motor   Lower Extremity Assessment Lower Extremity Assessment: Defer to PT evaluation RLE Deficits / Details: no better than 2+ to 3-/5 for hip flexion and hip abduction, knee flexion and ankle PF/DF 3/5 RLE: Unable to fully assess due to pain RLE Sensation: history of peripheral neuropathy LLE Deficits / Details: no better than 2+ to 3-/5 for hip flexion and hip abduction, knee flexion and ankle PF/DF 3/5  LLE: Unable to fully assess due to pain LLE Sensation: history of peripheral neuropathy   Cervical / Trunk Assessment Cervical / Trunk Assessment: Kyphotic Cervical / Trunk Exceptions: weakness, obesity   Communication Communication Communication: HOH   Cognition Arousal/Alertness: Awake/alert Behavior During Therapy: Flat affect;Anxious Overall Cognitive Status: Impaired/Different from baseline                                 General Comments: family reports that pt is usually quite motivated and is now confused, pt verbalizing during session, but  tangential,   General Comments  refused EOB/OOB today- unable to assess balance today    Exercises     Shoulder Instructions      Home Living Family/patient expects to be discharged to:: Private residence Living Arrangements: Children Available Help at Discharge: Family;Available PRN/intermittently Type of Home: House Home Access: Ramped entrance     Home Layout: One level     Bathroom Shower/Tub: Occupational psychologist: Handicapped height (has ileostomy, 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 Tends to fall at home when brother is not there. Per past charting, family member who stays with her is autistic.      Prior Functioning/Environment Level of Independence: Needs assistance  Gait / Transfers Assistance Needed: rollator with S ADL's / Homemaking Assistance Needed: family helps with dressing/bathing, iADLs but family does most of it   Comments: pt with poor awareness of safety        OT Problem List: Decreased strength      OT Treatment/Interventions: Self-care/ADL training;DME and/or AE instruction;Balance training;Patient/family education;Therapeutic activities;Therapeutic exercise    OT Goals(Current goals can be found in the care plan section) Acute Rehab OT Goals Patient Stated Goal: get more mobile and back to independence OT Goal Formulation: With patient/family Time For Goal Achievement: 07/05/20 Potential to Achieve Goals: Good ADL Goals Pt Will Perform Grooming: with supervision;sitting Pt Will Perform Upper Body Bathing: with min assist;sitting Pt Will Perform Upper Body Dressing: with  supervision;sitting Pt Will Transfer to Toilet: with mod assist;stand pivot transfer;bedside commode Pt/caregiver will Perform Home Exercise Program: Increased strength;Both right and left upper extremity;With Supervision ([facility]) Additional ADL Goal #1: Pt will perform bed mobility with moderate assistance in preparation for ADL.  OT Frequency: Min 2X/week   Barriers to D/C:            Co-evaluation PT/OT/SLP Co-Evaluation/Treatment: Yes Reason for Co-Treatment: For patient/therapist safety   OT goals addressed during session: Strengthening/ROM      AM-PAC OT "6 Clicks" Daily Activity     Outcome Measure Help from another person eating meals?: A Little Help from another person taking care of personal grooming?: A Little Help from another person toileting, which includes using toliet, bedpan, or urinal?: Total Help from another person bathing (including washing, rinsing, drying)?: A Lot Help from another person to put on and taking off regular upper body clothing?: A Lot Help from another person to put on and taking off regular lower body clothing?: Total 6 Click Score: 12   End of Session    Activity Tolerance: Patient limited by fatigue;Patient limited  by pain Patient left: in bed;with call bell/phone within reach;with bed alarm set;with family/visitor present  OT Visit Diagnosis: Muscle weakness (generalized) (M62.81)                Time: 2346-8873 OT Time Calculation (min): 21 min Charges:  OT General Charges $OT Visit: 1 Visit OT Evaluation $OT Eval Moderate Complexity: 1 Mod  Nestor Lewandowsky, OTR/L Acute Rehabilitation Services Pager: [phone] Office: [phone]  [name] 06/21/2020, 1:07 PM

## 2020-06-21 NOTE — Progress Notes (Signed)
Patient admitted for Sepsis, arived into the unit at about 0230. Patient is alert and oriented x4, able to communicate effectively. Skin assessment done with another RN, multiple abrasions noted to skin 2 open abrassions on the stomach above the colostomy, generalized echymosis scattered around the skin, excoriated MASD to all skin fold including the under breasts, groins and thigh. Areas were cleaned and antifungal poweder applied, Patient is alert and stable. Will continue to monitor.

## 2020-06-21 NOTE — Consult Note (Addendum)
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 [place] [address]  CBC     Status: Abnormal   Collection Time: 06/21/20  2:26 AM  Result Value Ref Range   WBC 13.5 (H) 4.0 - 10.5 K/uL   RBC 3.28 (L) 3.87 - 5.11 MIL/uL   Hemoglobin 10.1 (L) 12.0 - 15.0 g/dL   HCT 33.3 (L) 36.0 - 46.0 %   MCV 101.5 (H) 80.0 - 100.0 fL   MCH 30.8 26.0 - 34.0 pg   MCHC 30.3 30.0 - 36.0 g/dL   RDW 16.0 (H) 11.5 - 15.5 %   Platelets 203 150 - 400 K/uL   nRBC 0.0 0.0 - 0.2 %    Comment: Performed at [facility], [place] [address] [place], [place] [address]   Basic metabolic panel     Status: Abnormal   Collection Time: 06/21/20  2:26 AM  Result Value Ref Range   Sodium 138 135 - 145 mmol/L   Potassium 3.9 3.5 - 5.1 mmol/L   Chloride 99 98 - 111 mmol/L   CO2 30 22 - 32 mmol/L   Glucose, Bld 101 (H) 70 - 99 mg/dL    Comment: Glucose reference range applies only to samples taken after fasting for at least 8 hours.   BUN 10 8 - 23 mg/dL   Creatinine, Ser 4.08 (H) 0.44 - 1.00 mg/dL   Calcium 8.1 (L) 8.9 - 10.3 mg/dL   GFR, Estimated 11 (L) >60 mL/min    Comment: (NOTE) Calculated using the CKD-EPI Creatinine Equation (2021)    Anion gap 9 5 - 15    Comment: Performed at [facility]  Lab, [address] [place], [place] [address]  Lactic acid, plasma     Status: Abnormal   Collection Time: 06/21/20  2:26 AM  Result Value Ref Range   Lactic Acid, Venous 2.4 (HH) 0.5 - 1.9 mmol/L    Comment: CRITICAL VALUE NOTED.  VALUE IS CONSISTENT WITH PREVIOUSLY REPORTED AND CALLED VALUE. Performed at [facility], [place] [address], [place], [place] [address]   Lactic acid, plasma     Status: None   Collection Time: 06/21/20  7:14 AM  Result Value Ref Range   Lactic Acid, Venous 1.8 0.5 - 1.9 mmol/L    Comment: Performed at [place] [address], [place], [place] [address]  Glucose, capillary     Status: Abnormal   Collection Time: 06/21/20  8:10 AM  Result Value Ref Range   Glucose-Capillary 103 (H) 70 - 99 mg/dL    Comment: Glucose reference range applies only to samples taken after fasting for at least 8 hours.  Lactic acid, plasma     Status: None   Collection Time: 06/21/20 10:24 AM  Result Value Ref Range   Lactic Acid, Venous 1.7 0.5 - 1.9 mmol/L    Comment: Performed at [facility] [address], [place], [place] [address]  Glucose, capillary     Status: None   Collection Time: 06/21/20 11:59 AM  Result Value Ref Range   Glucose-Capillary 98 70 - 99 mg/dL    Comment: Glucose reference range applies only to  samples taken after fasting for at least 8 hours.    ROS: See HPI but also denying palpitations, but positive for weakness fatigue pain at PermCath site significant output from her ostomy   Physical Exam: Vitals:   06/21/20 1429 06/21/20 1531  BP:    Pulse:    Resp:    Temp: 100.3 F (37.9 C) 99.6 F (37.6 C)  SpO2:       General: Obese pleasant elderly female NAD HEENT: [place], PERRLA EOMI nonicteric Neck: Supple, no JVD appreciated, right IJ area minimal erythema covered now mostly with dressing sp TDC removal Heart: RRR no MRG Lungs: CTA nonlabored breathing Abdomen: Obese, BS positive normoactive, ostomy bag with black liquid stool. Extremities: Trace bipedal edema, thigh edema Skin: Skin warm and dry erythema minimal at IJ but dressing covering site of PermCath Neuro: Alert O x3 moves all extremities independently no acute focal deficits appreciated Dialysis Access: Left femoral AV Gore-Tex graft no bruit no thrill multiple upper extremity access sites clotted, not appreciated for infection  Dialysis Orders: [facility], 3.5hrs TTS  . EDW 98 kg, 2K, 2 CA bath  , Heparin none. Access did have a right IJ PermCath(now removed) recent clotted left femoral AVGG     Calcitriol 0.7 5 mcg p.o./HD  Mircera 60 MCG every 2 weeks last given 06/14/20     Assessment/Plan 1. MSSA bacteremia(etiology thought to be a right IJ PermCath)= ID consulted, currently on ceftriaxone and daptomycin; HD PermCath removed today plan for line holiday 2.  ESRD -HD TTS, last dialysis yesterday on schedule labs stable today no excess volume on chest x-ray check labs daily for line holiday until HD line placement 3. Hypertension/volume  -originally hypotensive in ER given IV fluids currently BP stable, no excess volume on chest x-ray mild volume on exam, no shortness of breath 4. Anemia  -Hgb 10.1 plan for Aranesp 1 week follow-up Hgb trend 5. Metabolic bone disease -corrected calcium okay follow-up calcium and  phosphorus binders with meals vitamin D on dialysis 6. Proximal A. Fib= currently sinus rhythm Eliquis on hold on amiodarone plan per admit 7. History of chronic respiratory failure= currently no changes in oxygen requirements no shortness of breath stressed volume compliance to patient and family in room secondary to no HD access currently and plan for line holiday secondary sepsis 8. Diabetes mellitus type 2 per admit 9. Debility and recent falls= PT OT consulting may need skilled nursing home  Ernest Haber, PA-C [facility] [phone] 06/21/2020, 3:42 PM   I have seen and examined this patient and agree with the plan of care. Erythematous along right chest wall/ neck and tender to palpation although pt states it was worse prior to the catheter being removed.   Appreciate [facility] taking care of this complicated patient.  No absolute indication for HD and I would favor giving her a line holiday through the weekend and  At the earliest placing a catheter for HD on Mon.  Dwana Melena, MD 06/21/2020, 7:23 PM

## 2020-06-21 NOTE — ED Notes (Signed)
Admitting MD notified on patient's elevated Lactic Acid results.

## 2020-06-21 NOTE — Evaluation (Signed)
Physical Therapy Evaluation Patient Details Name: [name] MRN: [id number] DOB: 11-11-1940 Today's Date: 06/21/2020   History of Present Illness  80yo female admitted 06/20/20 who recently had a R IJ HD cath placed on 06/14/20 secondary to occlusion of L femoral HD catheter; she then had a fall on 06/15/20. Negative for fractures, CTH clear. Found to be in severe sepsis with cellulitis around R IJ cath. PMH anxiety, A-fib, bell's palsy, pelvic CA, CHF, CKD, hx covid, DM, ESRD on HD, gout, HLD, HTN, DM with neuropathy, syncope, cervical surgery, trigger finger release  Clinical Impression   Patient received in bed, family present and assisted with providing PLOF/history. Patient adamantly refused EOB/OOB today, but agreeable to bed level evaluation. General MMT no better than 3/5 grossly. Very weak and family reports frequent falls recently. Left in bed with all needs met, bed alarm active and family present. Would definitely benefit from rehab in SNF setting prior to return home.     Follow Up Recommendations SNF;Supervision for mobility/OOB    Equipment Recommendations  Rolling walker with 5" wheels;3in1 (PT);Wheelchair (measurements PT);Wheelchair cushion (measurements PT)    Recommendations for Other Services       Precautions / Restrictions Precautions Precautions: Fall;Other (comment) Precaution Comments: frequent falls, chronic femoral cath/new R IJ cath, on O2 at baseline, claustrophobic Restrictions Weight Bearing Restrictions: No      Mobility  Bed Mobility               General bed mobility comments: refused    Transfers                 General transfer comment: refused  Ambulation/Gait             General Gait Details: refused  Stairs            Wheelchair Mobility    Modified Rankin (Stroke Patients Only)       Balance                                             Pertinent Vitals/Pain Pain Assessment:  Faces Faces Pain Scale: Hurts little more Pain Location: "it hurts from teh waist down" Pain Descriptors / Indicators: Discomfort Pain Intervention(s): Limited activity within patient's tolerance;Monitored during session    Home Living Family/patient expects to be discharged to:: Private residence Living Arrangements: Children Available Help at Discharge: Family;Available PRN/intermittently Type of Home: House Home Access: Ramped entrance     Home Layout: One level Home Equipment: Walker - 4 wheels;Grab bars - toilet;Grab bars - tub/shower;Shower seat;Bedside commode;Wheelchair - manual;Cane - single point;Cane - quad Additional Comments: cannot get rollator into the bathroom so she tends to leave it to go in the bathroom; multipole falls recently, including after getting femoral cath removed. Tends to fall at home when brother is not there. Per past charting, family member who stays with her is autistic.    Prior Function Level of Independence: Needs assistance   Gait / Transfers Assistance Needed: rollator with S  ADL's / Homemaking Assistance Needed: family helps with dressing/bathing, iADLs but family does most of it        Hand Dominance        Extremity/Trunk Assessment   Upper Extremity Assessment Upper Extremity Assessment: Defer to OT evaluation    Lower Extremity Assessment Lower Extremity Assessment: RLE deficits/detail;LLE deficits/detail RLE Deficits / Details:  no better than 2+ to 3-/5 for hip flexion and hip abduction, knee flexion and ankle PF/DF 3/5 RLE: Unable to fully assess due to pain RLE Sensation: history of peripheral neuropathy LLE Deficits / Details: no better than 2+ to 3-/5 for hip flexion and hip abduction, knee flexion and ankle PF/DF 3/5 LLE: Unable to fully assess due to pain LLE Sensation: history of peripheral neuropathy    Cervical / Trunk Assessment Cervical / Trunk Assessment: Kyphotic  Communication   Communication: HOH   Cognition Arousal/Alertness: Awake/alert Behavior During Therapy: Flat affect;Anxious Overall Cognitive Status: Impaired/Different from baseline                                 General Comments: awake and verbally interacted with [place] but kept her eyes closed during PT session; seems to have some anxiety about movement due to frequent falls. Family reports she is far from her cognitive baseline and has been confused.      General Comments General comments (skin integrity, edema, etc.): refused EOB/OOB today- unable to assess balance today    Exercises     Assessment/Plan    PT Assessment Patient needs continued PT services  PT Problem List Decreased strength;Decreased cognition;Decreased knowledge of use of DME;Decreased activity tolerance;Decreased safety awareness;Decreased balance;Decreased mobility;Decreased coordination;Impaired sensation       PT Treatment Interventions DME instruction;Balance training;Gait training;Neuromuscular re-education;Stair training;Cognitive remediation;Functional mobility training;Patient/family education;Therapeutic activities;Therapeutic exercise;Wheelchair mobility training    PT Goals (Current goals can be found in the Care Plan section)  Acute Rehab PT Goals Patient Stated Goal: get more mobile and back to independence PT Goal Formulation: With family Time For Goal Achievement: 07/05/20 Potential to Achieve Goals: Fair    Frequency Min 2X/week   Barriers to discharge Other (comment) from prior charting, family member who stays with her is autistic    Co-evaluation               AM-PAC PT "6 Clicks" Mobility  Outcome Measure Help needed turning from your back to your side while in a flat bed without using bedrails?: A Little Help needed moving from lying on your back to sitting on the side of a flat bed without using bedrails?: A Lot Help needed moving to and from a bed to a chair (including a wheelchair)?: Total Help  needed standing up from a chair using your arms (e.g., wheelchair or bedside chair)?: Total Help needed to walk in hospital room?: Total Help needed climbing 3-5 steps with a railing? : Total 6 Click Score: 9    End of Session   Activity Tolerance: Other (comment) (limited by malaise) Patient left: in bed;with call bell/phone within reach;with bed alarm set;with family/visitor present Nurse Communication: Mobility status PT Visit Diagnosis: Unsteadiness on feet (R26.81);Muscle weakness (generalized) (M62.81);Repeated falls (R29.6);Difficulty in walking, not elsewhere classified (R26.2)    Time: 6808-8110 PT Time Calculation (min) (ACUTE ONLY): 21 min   Charges:   PT Evaluation $PT Eval Moderate Complexity: 1 Mod          Windell Norfolk, DPT, PN1   Supplemental Physical Therapist [place]    Pager [phone] Acute Rehab Office [phone]

## 2020-06-21 NOTE — Progress Notes (Signed)
  Procedure Note   PRE-OPERATIVE DIAGNOSIS:  MSSA bacteremia   POST-OPERATIVE DIAGNOSIS:   MSSA bacteremia .  PROCEDURE:  Removal of right IJ perm cath.   PROVIDER:   Barbie Banner, PA-C.  ANESTHESIA:  None required  The risks and benefits of this procedure were explained to the patient and the patient consented.   OPERATIVE PROCEDURE:  The following procedure was performed at the bedside.  The right side of patient's neck and chest was prepped in standard fashion. The cuff was not incorporated enough to require dissection.  The catheter was removed in its entirety, and hemostasis was achieved with local compression.  Sterile dressing applied. No evidence of bleeding or hematoma.  The patient tolerated the procedure well and did not have any complications.  RECOMMENDATIONS:   Patient may resume diet and Eliquis from vascular surgery perspective.  Barbie Banner, PA-C [facility] [phone] 06/21/2020  3:40 PM

## 2020-06-22 ENCOUNTER — Other Ambulatory Visit (HOSPITAL_COMMUNITY): Payer: Medicare HMO

## 2020-06-22 DIAGNOSIS — B9561 Methicillin susceptible Staphylococcus aureus infection as the cause of diseases classified elsewhere: Secondary | ICD-10-CM | POA: Diagnosis not present

## 2020-06-22 DIAGNOSIS — R7881 Bacteremia: Secondary | ICD-10-CM | POA: Diagnosis not present

## 2020-06-22 LAB — GLUCOSE, CAPILLARY
Glucose-Capillary: 140 mg/dL — ABNORMAL HIGH (ref 70–99)
Glucose-Capillary: 133 mg/dL — ABNORMAL HIGH (ref 70–99)
Glucose-Capillary: 211 mg/dL — ABNORMAL HIGH (ref 70–99)
Glucose-Capillary: 174 mg/dL — ABNORMAL HIGH (ref 70–99)
Glucose-Capillary: 210 mg/dL — ABNORMAL HIGH (ref 70–99)

## 2020-06-22 LAB — PHOSPHORUS: Phosphorus: 4.5 mg/dL (ref 2.5–4.6)

## 2020-06-22 NOTE — Progress Notes (Signed)
[facility] Progress Note   80 y.o. 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 Found to be  febrile 100.8, w/  erythema on the right side of her neck, right upper chest surrounding area where the dialysis catheter was.  Blood cultures grew MSSA bacteremia ID was called(she has multiple allergies to antibiotics). Cath removed 5/27.  Dialysis Orders:  [facility] 3.5hrs TTS  EDW 98 kg, 2K, 2 CA bath  , Heparin none. Access did have a right IJ PermCath(now removed) recent clotted left femoral AVGG     Calcitriol 0.7 5 mcg p.o./HD  Mircera 60 MCG every 2 weeks last given 06/14/20      Assessment/ Plan:   1. MSSA bacteremia(etiology thought to be a right IJ PermCath)= ID consulted, currently on Ancef; HD PermCath removed 5/27. 2.  ESRD -HD TTS, last dialysis 5/26 w/ no c/o dyspnea. Will recheck labs and dose Lokelma if she becomes hyperkalemnic but labs on 5/27 were fine. Would like to hold off till Mon or Tues for line holiday. 3. Hypertension/volume  -originally hypotensive in ER given IV fluids currently BP stable, no excess volume on chest x-ray mild volume on exam, no shortness of breath 4. Anemia  -Hgb 10.1 plan for Aranesp 1 week follow-up Hgb trend 5. Metabolic bone disease -corrected calcium okay follow-up calcium and phosphorus binders with meals vitamin D on dialysis. Phos 4.5 on 5/28 6. Proximal A. Fib= currently sinus rhythm Eliquis on hold on amiodarone plan per admit 7. History of chronic respiratory failure= currently no changes in oxygen requirements no shortness  of breath stressed volume compliance to patient and family in room secondary to no HD access currently and plan for line holiday secondary sepsis 8. Diabetes mellitus type 2 per admit 9. Debility and recent falls= PT OT consulting may need skilled nursing home   Subjective:   Req O2 overnight but denies dyspnea/ fever/ chills. States that the pain over the TC tract and neck is much better c/w yesterday.   Objective:   BP (!) 110/53 (BP Location: Right Arm)   Pulse 60   Temp 97.8 F (36.6 C) (Oral)   Resp 15   Ht 5\' 3"  (1.6 m)   Wt 99.1 kg   SpO2 99%   BMI 38.70 kg/m   Intake/Output Summary (Last 24 hours) at 06/22/2020 9937 Last data filed at 06/22/2020 0600 Gross per 24 hour  Intake 446 ml  Output 625 ml  Net -179 ml   Weight change:   Physical Exam: General: Obese pleasant elderly female NAD Neck: Supple, no JVD appreciated, right IJ area minimal erythema and much less tender c/w yest, erythema less also Heart: RRR no MRG Lungs: CTA nonlabored breathing Abdomen: Obese, BS positive normoactive, ostomy bag with black liquid stool. Extremities: Trace bipedal edema, thigh edema Neuro: Alert O x3 moves all extremities independently no acute focal deficits appreciated Dialysis Access: Left femoral AV Gore-Tex graft no bruit no thrill multiple upper extremity access sites clotted, ecchymosis higher up in thigh  Imaging: DG Chest 1 View  Result Date: 06/20/2020 CLINICAL DATA:  Fall. EXAM: CHEST  1 VIEW COMPARISON:  06/15/2020 FINDINGS: 1936 hours. Low  lung volumes. The cardio pericardial silhouette is enlarged. Right IJ central line tip overlies the right atrium. Streaky opacity at the left base suggest atelectasis. Bones are demineralized. Telemetry leads overlie the chest. IMPRESSION: Low volume film with left base atelectasis. Electronically Signed   By: Misty Stanley M.D.   On: 06/20/2020 19:41   DG Lumbar Spine 2-3 Views  Result Date: 06/20/2020 CLINICAL DATA:  Fall 2 days  ago.  Low back pain.  Initial encounter. EXAM: LUMBAR SPINE - 2-3 VIEW COMPARISON:  None. FINDINGS: There is no evidence of lumbar spine fracture. Mild degenerative disc disease is seen at L4-5, facet DJD is seen at L4-5 and L5-S1. Grade 1 anterolisthesis is seen at L4-5 measuring 4 mm, which is attributable to degenerative changes at this level. No focal lytic or sclerotic bone lesions identified. Aortic atherosclerotic calcification noted. IMPRESSION: No acute findings. Lower lumbar degenerative spondylosis, with grade 1 anterolisthesis at L4-5. Electronically Signed   By: Marlaine Hind M.D.   On: 06/20/2020 19:42   CT Head Wo Contrast  Result Date: 06/20/2020 CLINICAL DATA:  Fall.  Head trauma. EXAM: CT HEAD WITHOUT CONTRAST TECHNIQUE: Contiguous axial images were obtained from the base of the skull through the vertex without intravenous contrast. COMPARISON:  05/15/2013 FINDINGS: Brain: There is no evidence for acute hemorrhage, hydrocephalus, mass lesion, or abnormal extra-axial fluid collection. No definite CT evidence for acute infarction. Diffuse loss of parenchymal volume is consistent with atrophy. Patchy low attenuation in the deep hemispheric and periventricular white matter is nonspecific, but likely reflects chronic microvascular ischemic demyelination. Calcification in the posterior left cerebral hemisphere is stable consistent with benign etiology, potentially vascular malformation. Vascular: No hyperdense vessel or unexpected calcification. Skull: No evidence for fracture. No worrisome lytic or sclerotic lesion. Sinuses/Orbits: The visualized paranasal sinuses and mastoid air cells are clear. Visualized portions of the globes and intraorbital fat are unremarkable. Other: None. IMPRESSION: 1. Stable exam.  No acute intracranial abnormality. Electronically Signed   By: Misty Stanley M.D.   On: 06/20/2020 19:56   [place] CHEST SOFT TISSUE  Result Date: 06/21/2020 CLINICAL DATA:  80 year old female  with removal of right IJ dialysis catheter. Redness over the right chest and neck. Concern for abscess. EXAM: ULTRASOUND OF HEAD/NECK SOFT TISSUES TECHNIQUE: Ultrasound examination of the head and neck soft tissues was performed in the area of clinical concern. COMPARISON:  None. FINDINGS: Targeted sonographic images of the soft tissues of the chest and right side of the neck in the region of the clinical concern was performed. There is heterogeneity of the soft tissues with complex fluid and edema tracking from the IJ into the soft tissues along the course of the catheter. A component of the collection in the right neck measures approximately 2.6 x 0.5 x 2.3 cm. No drainable fluid collection or abscess identified. There is mild hyperemia of the surrounding tissue. A 5 x 7 mm nodular density along the periphery of the lumen of the right IJ may represent a clot. IMPRESSION: 1. Complex fluid and edema tracking from the right IJ into the soft tissues along the course of the catheter. No drainable fluid collection or abscess. 2. Probable small clot within the lumen of the IJ at the insertion of the catheter. These results will be called to the ordering clinician or representative by the Radiologist Assistant, and communication documented in the PACS or [employer]. Electronically Signed   By: Anner Crete M.D.   On: 06/21/2020 21:51   DG Hip  Unilat W or Wo Pelvis 2-3 Views Right  Result Date: 06/20/2020 CLINICAL DATA:  Fall 2 days ago. Right hip pain. Initial encounter. EXAM: DG HIP (WITH OR WITHOUT PELVIS) 2-3V RIGHT COMPARISON:  None. FINDINGS: There is no evidence of hip fracture or dislocation. There is no evidence of arthropathy or other focal bone abnormality. Generalized osteopenia noted. IMPRESSION: No acute findings. Osteopenia. Electronically Signed   By: Marlaine Hind M.D.   On: 06/20/2020 19:43    Labs: BMET Recent Labs  Lab 06/15/20 1349 06/20/20 1649 06/21/20 0226 06/22/20 0022  NA  137 139 138  --   K 4.7 3.7 3.9  --   CL 101 99 99  --   CO2 25 30 30   --   GLUCOSE 187* 140* 101*  --   BUN 20 9 10   --   CREATININE 5.84* 3.46* 4.08*  --   CALCIUM 8.5* 8.0* 8.1*  --   PHOS  --   --   --  4.5   CBC Recent Labs  Lab 06/15/20 1349 06/20/20 1649 06/21/20 0226  WBC 5.9 13.1* 13.5*  NEUTROABS  --  11.1*  --   HGB 10.2* 11.5* 10.1*  HCT 34.0* 37.4 33.3*  MCV 101.8* 100.0 101.5*  PLT 200 230 203    Medications:    . amiodarone  200 mg Oral Daily  . Chlorhexidine Gluconate Cloth  6 each Topical Daily  . clonazePAM  1 mg Oral Daily  . estradiol  1 mg Oral Daily  . famotidine  20 mg Oral Q2000  . insulin aspart  0-5 Units Subcutaneous QHS  . insulin aspart  0-6 Units Subcutaneous TID WC  . levothyroxine  175 mcg Oral QAC breakfast  . simvastatin  10 mg Oral QHS      Otelia Santee, MD 06/22/2020, 7:11 AM

## 2020-06-22 NOTE — Progress Notes (Signed)
PROGRESS NOTE    [name]  EXB:[id number] DOB: March 05, 1940 DOA: 06/20/2020 PCP: Wannetta Sender, FNP    Brief Narrative:  80 year old female with extensive medical issues, ESRD on hemodialysis TTS schedule, paroxysmal A. fib on Eliquis, chronic respiratory failure on home oxygen, obesity, coronary artery disease, combined chronic heart failure, type 2 diabetes, hypertension, hyperlipidemia, hypothyroidism, diverticulosis and multiple comorbidities recently underwent tunneled IJ catheter placement on 5/20 due to occlusion of left femoral dialysis catheter.  She has frequent fall.  Also fell on 5/21.  Was complaining of right neck pain after catheter placement.  Continue to complain of back pain and right hip pain so came to ER. In the emergency room febrile and tachycardic on arrival.  Had received dialysis on 5/26 the day of admission.  COVID-19 negative.  Chest x-ray with streaky opacity left lung base.  Head CT negative.  X-ray of the right hip and pelvis without any injuries.  Started on daptomycin and ceftriaxone after blood cultures and admit to the hospital.  Right tunneled IJ catheter was obviously infected on presentation. Blood cultures growing MSSA on all culture bottles.  Assessment & Plan:   Principal Problem:   MSSA bacteremia Active Problems:   Hypothyroidism   Hyperlipidemia   Atrial fibrillation ([place])   ESRD (end stage renal disease) ([place])  Severe sepsis secondary to MSSA bacteremia, infected indwelling tunneled dialysis catheter: Treated with daptomycin and Rocephin.  Followed by ID.  Patient now on cefazolin. Hemodynamically stabilizing.  TEE pending. Permacath removed 5/27, post cultures negative so far.  ESRD on HD: TTS schedule . Received HD 5/26. Will need a line break.  Closely monitoring and trying to defer catheter until Tuesday.  Paroxysmal atrial fibrillation: Patient currently in sinus rhythm.  Eliquis on hold until permacath.  On  amiodarone.  Type 2 diabetes on insulin: Remains on sliding scale.  Stable.  Chronic respiratory failure: No change in oxygen requirement.  Fairly stable.  Debility and frailty/frequent falls: Work with PT OT.  Lives at home with family.  Overall with very poor mobility.   DVT prophylaxis: Place and maintain sequential compression device Start: 06/20/20 2134   Code Status: Full code Family Communication: Daughter on the phone called and updated. Disposition Plan: Status is: Inpatient  Remains inpatient appropriate because:Ongoing diagnostic testing needed not appropriate for outpatient work up and Inpatient level of care appropriate due to severity of illness   Dispo: The patient is from: Home              Anticipated d/c is to: Home with home health.              Patient currently is not medically stable to d/c.   Difficult to place patient No         Consultants:  Vascular surgery Nephrology  Procedures:   None  Antimicrobials:   Daptomycin and Rocephin 5/26--5/27  Cefazolin 5/28---     Subjective: Patient seen and examined.  She was trying to wash herself.  She had some itchiness and tenderness around the permacath removal site otherwise denies any complaints.  Remains afebrile.  Denies any shortness of breath or chest pain. Objective: Vitals:   06/21/20 1608 06/21/20 1627 06/21/20 2015 06/22/20 0458  BP: (!) 100/42 (!) 102/52 (!) 145/63 (!) 110/53  Pulse: 69 74 80 60  Resp: 20 19 17 15   Temp: 99.3 F (37.4 C)  100.2 F (37.9 C) 97.8 F (36.6 C)  TempSrc: Oral  Oral Oral  SpO2: (!) 88% (!) 30% 92% 99%  Weight:      Height:        Intake/Output Summary (Last 24 hours) at 06/22/2020 1146 Last data filed at 06/22/2020 1035 Gross per 24 hour  Intake 566 ml  Output 1025 ml  Net -459 ml   Filed Weights   06/20/20 2044 06/21/20 0254  Weight: 99.5 kg 99.1 kg    Examination:  General exam: Appears calm and comfortable. Currently on room air that  she uses at home.  Frail and debilitated.  Not in any distress. Respiratory system: Clear to auscultation. Respiratory effort normal.  Mostly bilateral clear. Cardiovascular system: S1 & S2 heard, RRR.  2+ bilateral pedal edema. Right chest wall incision site mildly tender with no fluctuation.  Gastrointestinal system: Abdomen is nondistended, soft and nontender. No organomegaly or masses felt. Normal bowel sounds heard. Patient has ostomy bag with brown liquidy stool. Central nervous system: Alert and oriented. No focal neurological deficits.  Generalized weakness.    Data Reviewed: I have personally reviewed following labs and imaging studies  CBC: Recent Labs  Lab 06/15/20 1349 06/20/20 1649 06/21/20 0226  WBC 5.9 13.1* 13.5*  NEUTROABS  --  11.1*  --   HGB 10.2* 11.5* 10.1*  HCT 34.0* 37.4 33.3*  MCV 101.8* 100.0 101.5*  PLT 200 230 387   Basic Metabolic Panel: Recent Labs  Lab 06/15/20 1349 06/20/20 1649 06/21/20 0226 06/22/20 0022  NA 137 139 138  --   K 4.7 3.7 3.9  --   CL 101 99 99  --   CO2 25 30 30   --   GLUCOSE 187* 140* 101*  --   BUN 20 9 10   --   CREATININE 5.84* 3.46* 4.08*  --   CALCIUM 8.5* 8.0* 8.1*  --   PHOS  --   --   --  4.5   GFR: Estimated Creatinine Clearance: 13 mL/min (A) (by C-G formula based on SCr of 4.08 mg/dL (H)). Liver Function Tests: Recent Labs  Lab 06/20/20 1649  AST 30  ALT 17  ALKPHOS 121  BILITOT 1.1  PROT 6.6  ALBUMIN 2.9*   No results for input(s): LIPASE, AMYLASE in the last 168 hours. No results for input(s): AMMONIA in the last 168 hours. Coagulation Profile: Recent Labs  Lab 06/20/20 1649  INR 1.6*   Cardiac Enzymes: Recent Labs  Lab 06/21/20 0226  CKTOTAL 18*   BNP (last 3 results) No results for input(s): PROBNP in the last 8760 hours. HbA1C: Recent Labs    06/21/20 0226  HGBA1C 5.5   CBG: Recent Labs  Lab 06/21/20 1159 06/21/20 1654 06/21/20 2118 06/22/20 0512 06/22/20 0750  GLUCAP  98 123* 126* 140* 133*   Lipid Profile: No results for input(s): CHOL, HDL, LDLCALC, TRIG, CHOLHDL, LDLDIRECT in the last 72 hours. Thyroid Function Tests: No results for input(s): TSH, T4TOTAL, FREET4, T3FREE, THYROIDAB in the last 72 hours. Anemia Panel: No results for input(s): VITAMINB12, FOLATE, FERRITIN, TIBC, IRON, RETICCTPCT in the last 72 hours. Sepsis Labs: Recent Labs  Lab 06/20/20 2209 06/21/20 0226 06/21/20 0714 06/21/20 1024  LATICACIDVEN 3.7* 2.4* 1.8 1.7    Recent Results (from the past 240 hour(s))  Blood culture (routine x 2)     Status: None (Preliminary result)   Collection Time: 06/20/20  4:55 PM   Specimen: BLOOD RIGHT HAND  Result Value Ref Range Status   Specimen Description BLOOD RIGHT HAND  Final   Special Requests  Final    BOTTLES DRAWN AEROBIC AND ANAEROBIC Blood Culture results may not be optimal due to an inadequate volume of blood received in culture bottles   Culture  Setup Time   Final    GRAM POSITIVE COCCI IN CLUSTERS IN BOTH AEROBIC AND ANAEROBIC BOTTLES CRITICAL VALUE NOTED.  VALUE IS CONSISTENT WITH PREVIOUSLY REPORTED AND CALLED VALUE. Performed at [facility], [place] [address], [place], [place] [address]    Culture GRAM POSITIVE COCCI  Final   Report Status PENDING  Incomplete  Resp Panel by RT-PCR (Flu A&B, Covid) Nasopharyngeal Swab     Status: None   Collection Time: 06/20/20  5:51 PM   Specimen: Nasopharyngeal Swab; Nasopharyngeal(NP) swabs in vial transport medium  Result Value Ref Range Status   SARS Coronavirus 2 by RT PCR NEGATIVE NEGATIVE Final    Comment: (NOTE) SARS-CoV-2 target nucleic acids are NOT DETECTED.  The SARS-CoV-2 RNA is generally detectable in upper respiratory specimens during the acute phase of infection. The lowest concentration of SARS-CoV-2 viral copies this assay can detect is 138 copies/mL. A negative result does not preclude SARS-Cov-2 infection and should not be used as the sole basis for  treatment or other patient management decisions. A negative result may occur with  improper specimen collection/handling, submission of specimen other than nasopharyngeal swab, presence of viral mutation(s) within the areas targeted by this assay, and inadequate number of viral copies(<138 copies/mL). A negative result must be combined with clinical observations, patient history, and epidemiological information. The expected result is Negative.  Fact Sheet for Patients:  EntrepreneurPulse.com.au  Fact Sheet for Healthcare Providers:  IncredibleEmployment.be  This test is no t yet approved or cleared by the [place] FDA and  has been authorized for detection and/or diagnosis of SARS-CoV-2 by FDA under an Emergency Use Authorization (EUA). This EUA will remain  in effect (meaning this test can be used) for the duration of the COVID-19 declaration under Section 564(b)(1) of the Act, 21 U.S.C.section 360bbb-3(b)(1), unless the authorization is terminated  or revoked sooner.       Influenza A by PCR NEGATIVE NEGATIVE Final   Influenza B by PCR NEGATIVE NEGATIVE Final    Comment: (NOTE) The Xpert Xpress SARS-CoV-2/FLU/RSV plus assay is intended as an aid in the diagnosis of influenza from Nasopharyngeal swab specimens and should not be used as a sole basis for treatment. Nasal washings and aspirates are unacceptable for Xpert Xpress SARS-CoV-2/FLU/RSV testing.  Fact Sheet for Patients: EntrepreneurPulse.com.au  Fact Sheet for Healthcare Providers: IncredibleEmployment.be  This test is not yet approved or cleared by the [place] FDA and has been authorized for detection and/or diagnosis of SARS-CoV-2 by FDA under an Emergency Use Authorization (EUA). This EUA will remain in effect (meaning this test can be used) for the duration of the COVID-19 declaration under Section 564(b)(1) of the Act, 21  U.S.C. section 360bbb-3(b)(1), unless the authorization is terminated or revoked.  Performed at [facility], [place] [address], [place], [place] [address]   Blood culture (routine x 2)     Status: Abnormal (Preliminary result)   Collection Time: 06/20/20  6:25 PM   Specimen: Right Antecubital; Blood  Result Value Ref Range Status   Specimen Description RIGHT ANTECUBITAL  Final   Special Requests   Final    BOTTLES DRAWN AEROBIC ONLY Blood Culture adequate volume   Culture  Setup Time   Final    GRAM POSITIVE COCCI IN CLUSTERS AEROBIC BOTTLE ONLY CRITICAL  RESULT CALLED TO, READ BACK BY AND VERIFIED WITH: PHARMD M.YATES AT 1128 ON 06/21/2020 BY T.SAAD.    Culture (A)  Final    STAPHYLOCOCCUS AUREUS SUSCEPTIBILITIES TO FOLLOW Performed at [facility], [place] [address], [place], [place] [address]    Report Status PENDING  Incomplete  Blood Culture ID Panel (Reflexed)     Status: Abnormal   Collection Time: 06/20/20  6:25 PM  Result Value Ref Range Status   Enterococcus faecalis NOT DETECTED NOT DETECTED Final   Enterococcus Faecium NOT DETECTED NOT DETECTED Final   Listeria monocytogenes NOT DETECTED NOT DETECTED Final   Staphylococcus species DETECTED (A) NOT DETECTED Final   Staphylococcus aureus (BCID) DETECTED (A) NOT DETECTED Final    Comment: CRITICAL RESULT CALLED TO, READ BACK BY AND VERIFIED WITH: M. PHAM PHARMD AT 1915 06/21/20 BY D. VANHOOK    Staphylococcus epidermidis NOT DETECTED NOT DETECTED Final   Staphylococcus lugdunensis NOT DETECTED NOT DETECTED Final   Streptococcus species NOT DETECTED NOT DETECTED Final   Streptococcus agalactiae NOT DETECTED NOT DETECTED Final   Streptococcus pneumoniae NOT DETECTED NOT DETECTED Final   Streptococcus pyogenes NOT DETECTED NOT DETECTED Final   A.calcoaceticus-baumannii NOT DETECTED NOT DETECTED Final   Bacteroides fragilis NOT DETECTED NOT DETECTED Final   Enterobacterales NOT DETECTED NOT DETECTED Final    Enterobacter cloacae complex NOT DETECTED NOT DETECTED Final   Escherichia coli NOT DETECTED NOT DETECTED Final   Klebsiella aerogenes NOT DETECTED NOT DETECTED Final   Klebsiella oxytoca NOT DETECTED NOT DETECTED Final   Klebsiella pneumoniae NOT DETECTED NOT DETECTED Final   Proteus species NOT DETECTED NOT DETECTED Final   Salmonella species NOT DETECTED NOT DETECTED Final   Serratia marcescens NOT DETECTED NOT DETECTED Final   Haemophilus influenzae NOT DETECTED NOT DETECTED Final   Neisseria meningitidis NOT DETECTED NOT DETECTED Final   Pseudomonas aeruginosa NOT DETECTED NOT DETECTED Final   Stenotrophomonas maltophilia NOT DETECTED NOT DETECTED Final   Candida albicans NOT DETECTED NOT DETECTED Final   Candida auris NOT DETECTED NOT DETECTED Final   Candida glabrata NOT DETECTED NOT DETECTED Final   Candida krusei NOT DETECTED NOT DETECTED Final   Candida parapsilosis NOT DETECTED NOT DETECTED Final   Candida tropicalis NOT DETECTED NOT DETECTED Final   Cryptococcus neoformans/gattii NOT DETECTED NOT DETECTED Final   Meth resistant mecA/C and MREJ NOT DETECTED NOT DETECTED Final    Comment: Performed at [facility], [address] [place], [place] [address]  Culture, blood (routine x 2)     Status: None (Preliminary result)   Collection Time: 06/21/20  3:01 PM   Specimen: BLOOD  Result Value Ref Range Status   Specimen Description BLOOD LEFT ANTECUBITAL  Final   Special Requests   Final    BOTTLES DRAWN AEROBIC ONLY Blood Culture results may not be optimal due to an inadequate volume of blood received in culture bottles   Culture   Final    NO GROWTH < 24 HOURS Performed at [facility], [place] [address] [place], [place] [address]    Report Status PENDING  Incomplete  Culture, blood (routine x 2)     Status: None (Preliminary result)   Collection Time: 06/21/20  3:10 PM   Specimen: BLOOD RIGHT HAND  Result Value Ref Range Status   Specimen Description BLOOD  RIGHT HAND  Final   Special Requests   Final    BOTTLES DRAWN AEROBIC ONLY Blood Culture results may  not be optimal due to an inadequate volume of blood received in culture bottles   Culture   Final    NO GROWTH < 24 HOURS Performed at [place] [address], [place], [place] [address]    Report Status PENDING  Incomplete         Radiology Studies: DG Chest 1 View  Result Date: 06/20/2020 CLINICAL DATA:  Fall. EXAM: CHEST  1 VIEW COMPARISON:  06/15/2020 FINDINGS: 1936 hours. Low lung volumes. The cardio pericardial silhouette is enlarged. Right IJ central line tip overlies the right atrium. Streaky opacity at the left base suggest atelectasis. Bones are demineralized. Telemetry leads overlie the chest. IMPRESSION: Low volume film with left base atelectasis. Electronically Signed   By: Misty Stanley M.D.   On: 06/20/2020 19:41   DG Lumbar Spine 2-3 Views  Result Date: 06/20/2020 CLINICAL DATA:  Fall 2 days ago.  Low back pain.  Initial encounter. EXAM: LUMBAR SPINE - 2-3 VIEW COMPARISON:  None. FINDINGS: There is no evidence of lumbar spine fracture. Mild degenerative disc disease is seen at L4-5, facet DJD is seen at L4-5 and L5-S1. Grade 1 anterolisthesis is seen at L4-5 measuring 4 mm, which is attributable to degenerative changes at this level. No focal lytic or sclerotic bone lesions identified. Aortic atherosclerotic calcification noted. IMPRESSION: No acute findings. Lower lumbar degenerative spondylosis, with grade 1 anterolisthesis at L4-5. Electronically Signed   By: Marlaine Hind M.D.   On: 06/20/2020 19:42   CT Head Wo Contrast  Result Date: 06/20/2020 CLINICAL DATA:  Fall.  Head trauma. EXAM: CT HEAD WITHOUT CONTRAST TECHNIQUE: Contiguous axial images were obtained from the base of the skull through the vertex without intravenous contrast. COMPARISON:  05/15/2013 FINDINGS: Brain: There is no evidence for acute hemorrhage, hydrocephalus, mass lesion, or abnormal  extra-axial fluid collection. No definite CT evidence for acute infarction. Diffuse loss of parenchymal volume is consistent with atrophy. Patchy low attenuation in the deep hemispheric and periventricular white matter is nonspecific, but likely reflects chronic microvascular ischemic demyelination. Calcification in the posterior left cerebral hemisphere is stable consistent with benign etiology, potentially vascular malformation. Vascular: No hyperdense vessel or unexpected calcification. Skull: No evidence for fracture. No worrisome lytic or sclerotic lesion. Sinuses/Orbits: The visualized paranasal sinuses and mastoid air cells are clear. Visualized portions of the globes and intraorbital fat are unremarkable. Other: None. IMPRESSION: 1. Stable exam.  No acute intracranial abnormality. Electronically Signed   By: Misty Stanley M.D.   On: 06/20/2020 19:56   [place] CHEST SOFT TISSUE  Result Date: 06/21/2020 CLINICAL DATA:  80 year old female with removal of right IJ dialysis catheter. Redness over the right chest and neck. Concern for abscess. EXAM: ULTRASOUND OF HEAD/NECK SOFT TISSUES TECHNIQUE: Ultrasound examination of the head and neck soft tissues was performed in the area of clinical concern. COMPARISON:  None. FINDINGS: Targeted sonographic images of the soft tissues of the chest and right side of the neck in the region of the clinical concern was performed. There is heterogeneity of the soft tissues with complex fluid and edema tracking from the IJ into the soft tissues along the course of the catheter. A component of the collection in the right neck measures approximately 2.6 x 0.5 x 2.3 cm. No drainable fluid collection or abscess identified. There is mild hyperemia of the surrounding tissue. A 5 x 7 mm nodular density along the periphery of the lumen of the right IJ may represent a clot. IMPRESSION: 1. Complex  fluid and edema tracking from the right IJ into the soft tissues along the course of the  catheter. No drainable fluid collection or abscess. 2. Probable small clot within the lumen of the IJ at the insertion of the catheter. These results will be called to the ordering clinician or representative by the Radiologist Assistant, and communication documented in the PACS or [employer]. Electronically Signed   By: Anner Crete M.D.   On: 06/21/2020 21:51   DG Hip Unilat W or Wo Pelvis 2-3 Views Right  Result Date: 06/20/2020 CLINICAL DATA:  Fall 2 days ago. Right hip pain. Initial encounter. EXAM: DG HIP (WITH OR WITHOUT PELVIS) 2-3V RIGHT COMPARISON:  None. FINDINGS: There is no evidence of hip fracture or dislocation. There is no evidence of arthropathy or other focal bone abnormality. Generalized osteopenia noted. IMPRESSION: No acute findings. Osteopenia. Electronically Signed   By: Marlaine Hind M.D.   On: 06/20/2020 19:43        Scheduled Meds: . amiodarone  200 mg Oral Daily  . Chlorhexidine Gluconate Cloth  6 each Topical Daily  . clonazePAM  1 mg Oral Daily  . estradiol  1 mg Oral Daily  . famotidine  20 mg Oral Q2000  . insulin aspart  0-5 Units Subcutaneous QHS  . insulin aspart  0-6 Units Subcutaneous TID WC  . levothyroxine  175 mcg Oral QAC breakfast  . simvastatin  10 mg Oral QHS   Continuous Infusions: .  ceFAZolin (ANCEF) IV 1 g (06/22/20 0940)     LOS: 2 days    Time spent: 34 minutes    Barb Merino, MD [facility] Pager [phone]

## 2020-06-22 NOTE — Progress Notes (Signed)
[place] [facility]    Date of Admission:  06/20/2020   Total days of antibiotics 3          ID: [name] is a 80 y.o. female with MSSA bacteremia in setting of tunneled catheter Principal Problem:   MSSA bacteremia Active Problems:   Hypothyroidism   Hyperlipidemia   Atrial fibrillation (HCC)   ESRD (end stage renal disease) ([place])    Subjective: She has had her right tunneled HD catheter removed last night, she has some itching about dressing. Also she mentioned that she had some abdominal wall lesions that appears to be getting larger? She subscribes to chills but no fevers, nightsweats  12 point ros is negative except what is mentioned in hpi  Medications:  . amiodarone  200 mg Oral Daily  . Chlorhexidine Gluconate Cloth  6 each Topical Daily  . clonazePAM  1 mg Oral Daily  . estradiol  1 mg Oral Daily  . famotidine  20 mg Oral Q2000  . insulin aspart  0-5 Units Subcutaneous QHS  . insulin aspart  0-6 Units Subcutaneous TID WC  . levothyroxine  175 mcg Oral QAC breakfast  . simvastatin  10 mg Oral QHS    Objective: Vital signs in last 24 hours: Temp:  [97.8 F (36.6 C)-100.2 F (37.9 C)] 98.7 F (37.1 C) (05/28 1225) Pulse Rate:  [60-80] 60 (05/28 1225) Resp:  [15-20] 19 (05/28 1225) BP: (98-145)/(42-66) 98/66 (05/28 1225) SpO2:  [30 %-99 %] 91 % (05/28 1225) Physical Exam  Constitutional:  oriented to person, place, and time. appears well-developed and well-nourished. No distress.  HENT: [place]/AT, PERRLA, no scleral icterus Mouth/Throat: Oropharynx is clear and moist. No oropharyngeal exudate.  Cardiovascular: Normal rate, regular rhythm and normal heart sounds. Exam reveals no gallop and no friction rub.  No murmur heard.  Chest wall =bandaged Pulmonary/Chest: Effort normal and breath sounds normal. No respiratory distress.  has no wheezes.  Neck = supple, no nuchal rigidity Abdominal: Soft. Bowel sounds are normal.  exhibits no  distension. Ostomy. Has LUQ lesion (some fibrinous exudate) and parastomal lesion ( also fibrinous exudate) Lymphadenopathy: no cervical adenopathy. No axillary adenopathy Neurological: alert and oriented to person, place, and time.  Skin: Skin is warm and dry. No rash noted. No erythema.  Psychiatric: a normal mood and affect.  behavior is normal.   Lab Results Recent Labs    06/20/20 1649 06/21/20 0226  WBC 13.1* 13.5*  HGB 11.5* 10.1*  HCT 37.4 33.3*  NA 139 138  K 3.7 3.9  CL 99 99  CO2 30 30  BUN 9 10  CREATININE 3.46* 4.08*   Liver Panel Recent Labs    06/20/20 1649  PROT 6.6  ALBUMIN 2.9*  AST 30  ALT 17  ALKPHOS 121  BILITOT 1.1   Sedimentation Rate No results for input(s): ESRSEDRATE in the last 72 hours. C-Reactive Protein No results for input(s): CRP in the last 72 hours.  Microbiology: 5/27 blood cx NGTD at 24hr Studies/Results: DG Chest 1 View  Result Date: 06/20/2020 CLINICAL DATA:  Fall. EXAM: CHEST  1 VIEW COMPARISON:  06/15/2020 FINDINGS: 1936 hours. Low lung volumes. The cardio pericardial silhouette is enlarged. Right IJ central line tip overlies the right atrium. Streaky opacity at the left base suggest atelectasis. Bones are demineralized. Telemetry leads overlie the chest. IMPRESSION: Low volume film with left base atelectasis. Electronically Signed   By: Misty Stanley M.D.   On: 06/20/2020 19:41  DG Lumbar Spine 2-3 Views  Result Date: 06/20/2020 CLINICAL DATA:  Fall 2 days ago.  Low back pain.  Initial encounter. EXAM: LUMBAR SPINE - 2-3 VIEW COMPARISON:  None. FINDINGS: There is no evidence of lumbar spine fracture. Mild degenerative disc disease is seen at L4-5, facet DJD is seen at L4-5 and L5-S1. Grade 1 anterolisthesis is seen at L4-5 measuring 4 mm, which is attributable to degenerative changes at this level. No focal lytic or sclerotic bone lesions identified. Aortic atherosclerotic calcification noted. IMPRESSION: No acute findings. Lower  lumbar degenerative spondylosis, with grade 1 anterolisthesis at L4-5. Electronically Signed   By: Marlaine Hind M.D.   On: 06/20/2020 19:42   CT Head Wo Contrast  Result Date: 06/20/2020 CLINICAL DATA:  Fall.  Head trauma. EXAM: CT HEAD WITHOUT CONTRAST TECHNIQUE: Contiguous axial images were obtained from the base of the skull through the vertex without intravenous contrast. COMPARISON:  05/15/2013 FINDINGS: Brain: There is no evidence for acute hemorrhage, hydrocephalus, mass lesion, or abnormal extra-axial fluid collection. No definite CT evidence for acute infarction. Diffuse loss of parenchymal volume is consistent with atrophy. Patchy low attenuation in the deep hemispheric and periventricular white matter is nonspecific, but likely reflects chronic microvascular ischemic demyelination. Calcification in the posterior left cerebral hemisphere is stable consistent with benign etiology, potentially vascular malformation. Vascular: No hyperdense vessel or unexpected calcification. Skull: No evidence for fracture. No worrisome lytic or sclerotic lesion. Sinuses/Orbits: The visualized paranasal sinuses and mastoid air cells are clear. Visualized portions of the globes and intraorbital fat are unremarkable. Other: None. IMPRESSION: 1. Stable exam.  No acute intracranial abnormality. Electronically Signed   By: Misty Stanley M.D.   On: 06/20/2020 19:56   [place] CHEST SOFT TISSUE  Result Date: 06/21/2020 CLINICAL DATA:  80 year old female with removal of right IJ dialysis catheter. Redness over the right chest and neck. Concern for abscess. EXAM: ULTRASOUND OF HEAD/NECK SOFT TISSUES TECHNIQUE: Ultrasound examination of the head and neck soft tissues was performed in the area of clinical concern. COMPARISON:  None. FINDINGS: Targeted sonographic images of the soft tissues of the chest and right side of the neck in the region of the clinical concern was performed. There is heterogeneity of the soft tissues with  complex fluid and edema tracking from the IJ into the soft tissues along the course of the catheter. A component of the collection in the right neck measures approximately 2.6 x 0.5 x 2.3 cm. No drainable fluid collection or abscess identified. There is mild hyperemia of the surrounding tissue. A 5 x 7 mm nodular density along the periphery of the lumen of the right IJ may represent a clot. IMPRESSION: 1. Complex fluid and edema tracking from the right IJ into the soft tissues along the course of the catheter. No drainable fluid collection or abscess. 2. Probable small clot within the lumen of the IJ at the insertion of the catheter. These results will be called to the ordering clinician or representative by the Radiologist Assistant, and communication documented in the PACS or [employer]. Electronically Signed   By: Anner Crete M.D.   On: 06/21/2020 21:51   DG Hip Unilat W or Wo Pelvis 2-3 Views Right  Result Date: 06/20/2020 CLINICAL DATA:  Fall 2 days ago. Right hip pain. Initial encounter. EXAM: DG HIP (WITH OR WITHOUT PELVIS) 2-3V RIGHT COMPARISON:  None. FINDINGS: There is no evidence of hip fracture or dislocation. There is no evidence of arthropathy or other focal bone abnormality.  Generalized osteopenia noted. IMPRESSION: No acute findings. Osteopenia. Electronically Signed   By: Marlaine Hind M.D.   On: 06/20/2020 19:43     Assessment/Plan: MSSA bacteremia = continue on cefazolin, hopefully has source control since line removed. Recommend to see if the fluid collection noted on 5/27 CT is not an abscess vs clot.  Agree with plan to wait for any new line placement until repeat blood cx are NGTD at 72hr.  Superficial skin wounds = please have nursing do wet do dry dressing daily on her abdominal wall wounds.vs. wound care consultation for management    [facility] for Infectious Diseases Cell: [phone] Pager: [phone]  06/22/2020, 3:33 PM

## 2020-06-22 NOTE — Progress Notes (Signed)
[facility]  Subjective  -right IJ tunneled dialysis catheter removed yesterday with bacteremia.   Objective (!) 110/53 60 97.8 F (36.6 C) (Oral) 15 99%  Intake/Output Summary (Last 24 hours) at 06/22/2020 0910 Last data filed at 06/22/2020 0600 Gross per 24 hour  Intake 446 ml  Output 625 ml  Net -179 ml    Previous right IJ insertion site with no hematoma status post catheter removal  Laboratory Lab Results: Recent Labs    06/20/20 1649 06/21/20 0226  WBC 13.1* 13.5*  HGB 11.5* 10.1*  HCT 37.4 33.3*  PLT 230 203   BMET Recent Labs    06/20/20 1649 06/21/20 0226  NA 139 138  K 3.7 3.9  CL 99 99  CO2 30 30  GLUCOSE 140* 101*  BUN 9 10  CREATININE 3.46* 4.08*  CALCIUM 8.0* 8.1*    COAG Lab Results  Component Value Date   INR 1.6 (H) 06/20/2020   INR 1.1 05/19/2019   INR 1.2 10/05/2018   No results found for: PTT  Assessment/Planning:  80 year old female with end-stage renal disease underwent right IJ tunneled dialysis catheter removal yesterday given sepsis and bacteremia.  Will await input from ID and nephrology but looks like Monday or Tuesday per nephrology notes to have catheter replaced.  I will tentatively put her on the OR schedule for Tuesday for [facility].  Marty Heck 06/22/2020 9:10 AM --

## 2020-06-23 ENCOUNTER — Other Ambulatory Visit: Payer: Self-pay

## 2020-06-23 ENCOUNTER — Inpatient Hospital Stay (HOSPITAL_COMMUNITY): Payer: Medicare HMO

## 2020-06-23 DIAGNOSIS — B9561 Methicillin susceptible Staphylococcus aureus infection as the cause of diseases classified elsewhere: Secondary | ICD-10-CM | POA: Diagnosis not present

## 2020-06-23 DIAGNOSIS — R7881 Bacteremia: Secondary | ICD-10-CM

## 2020-06-23 LAB — BASIC METABOLIC PANEL WITH GFR
Anion gap: 9 (ref 5–15)
BUN: 31 mg/dL — ABNORMAL HIGH (ref 8–23)
CO2: 26 mmol/L (ref 22–32)
Calcium: 8.7 mg/dL — ABNORMAL LOW (ref 8.9–10.3)
Chloride: 98 mmol/L (ref 98–111)
Creatinine, Ser: 6.26 mg/dL — ABNORMAL HIGH (ref 0.44–1.00)
GFR, Estimated: 6 mL/min — ABNORMAL LOW (ref >60)
Glucose, Bld: 144 mg/dL — ABNORMAL HIGH (ref 70–99)
Potassium: 3.9 mmol/L (ref 3.5–5.1)
Sodium: 133 mmol/L — ABNORMAL LOW (ref 135–145)

## 2020-06-23 LAB — CBC WITH DIFFERENTIAL/PLATELET
Abs Immature Granulocytes: 0.09 10*3/uL — ABNORMAL HIGH (ref 0.00–0.07)
Basophils Absolute: 0.1 10*3/uL (ref 0.0–0.1)
Basophils Relative: 1 %
Eosinophils Absolute: 0.1 10*3/uL (ref 0.0–0.5)
Eosinophils Relative: 1 %
HCT: 31.2 % — ABNORMAL LOW (ref 36.0–46.0)
Hemoglobin: 9.8 g/dL — ABNORMAL LOW (ref 12.0–15.0)
Immature Granulocytes: 1 %
Lymphocytes Relative: 19 %
Lymphs Abs: 1.4 10*3/uL (ref 0.7–4.0)
MCH: 30.9 pg (ref 26.0–34.0)
MCHC: 31.4 g/dL (ref 30.0–36.0)
MCV: 98.4 fL (ref 80.0–100.0)
Monocytes Absolute: 1 10*3/uL (ref 0.1–1.0)
Monocytes Relative: 14 %
Neutro Abs: 4.6 10*3/uL (ref 1.7–7.7)
Neutrophils Relative %: 64 %
Platelets: 226 10*3/uL (ref 150–400)
RBC: 3.17 MIL/uL — ABNORMAL LOW (ref 3.87–5.11)
RDW: 15.8 % — ABNORMAL HIGH (ref 11.5–15.5)
WBC: 7.2 10*3/uL (ref 4.0–10.5)
nRBC: 0 % (ref 0.0–0.2)

## 2020-06-23 LAB — ECHOCARDIOGRAM COMPLETE
AR max vel: 1.5 cm2
AV Area VTI: 1.39 cm2
AV Area mean vel: 1.44 cm2
AV Mean grad: 7 mmHg
AV Peak grad: 13.8 mmHg
Ao pk vel: 1.86 m/s
Area-P 1/2: 4.31 cm2
Height: 63 in
MV VTI: 1.92 cm2
S' Lateral: 3.9 cm
Weight: 3495.61 oz

## 2020-06-23 LAB — CULTURE, BLOOD (ROUTINE X 2): Special Requests: ADEQUATE

## 2020-06-23 LAB — GLUCOSE, CAPILLARY
Glucose-Capillary: 134 mg/dL — ABNORMAL HIGH (ref 70–99)
Glucose-Capillary: 141 mg/dL — ABNORMAL HIGH (ref 70–99)
Glucose-Capillary: 175 mg/dL — ABNORMAL HIGH (ref 70–99)
Glucose-Capillary: 177 mg/dL — ABNORMAL HIGH (ref 70–99)
Glucose-Capillary: 148 mg/dL — ABNORMAL HIGH (ref 70–99)

## 2020-06-23 NOTE — Progress Notes (Signed)
  Echocardiogram 2D Echocardiogram has been performed.  [name] 06/23/2020, 3:26 PM

## 2020-06-23 NOTE — Progress Notes (Signed)
[facility] Progress Note   80 y.o. 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 Found to be  febrile 100.8, w/  erythema on the right side of her neck, right upper chest surrounding area where the dialysis catheter was.  Blood cultures grew MSSA bacteremia ID was called(she has multiple allergies to antibiotics). Cath removed 5/27.  Dialysis Orders:  [facility] 3.5hrs TTS  EDW 98 kg, 2K, 2 CA bath  , Heparin none. Access did have a right IJ PermCath(now removed) recent clotted left femoral AVGG     Calcitriol 0.7 5 mcg p.o./HD  Mircera 60 MCG every 2 weeks last given 06/14/20      Assessment/ Plan:   1. MSSA bacteremia(etiology thought to be a right IJ PermCath)= ID consulted, currently on Ancef; HD PermCath removed 5/27. - Appreciate ID and VVS -> Line placement 72 hrs after negative culture. Currently cxs on 5/27 NTD.  2.  ESRD -HD TTS, last dialysis 5/26 w/ no c/o dyspnea. Will recheck labs and dose Lokelma if she becomes hyperkalemnic. Would like to hold off till  Tues for line holiday. - K 3.9 and actually volume neg bec of the diarrhea. - Dr. Carlis Abbott planning on placing tunneled catheter Tuesday for HD as long as 5/27 cultures remain negative.  3. Hypertension/volume  -originally hypotensive in ER given IV fluids currently BP stable, no excess volume on chest x-ray mild volume on exam, no shortness of breath 4. Anemia  -Hgb 10.1 plan for Aranesp 1 week follow-up Hgb trend 5. Metabolic bone disease -corrected calcium okay follow-up calcium and phosphorus binders with  meals vitamin D on dialysis. Phos 4.5 on 5/28 6. Proximal A. Fib= currently sinus rhythm Eliquis on hold on amiodarone plan per admit 7. History of chronic respiratory failure= currently no changes in oxygen requirements no shortness of breath stressed volume compliance to patient and family in room secondary to no HD access currently and plan for line holiday secondary sepsis 8. Diabetes mellitus type 2 per admit 9. Debility and recent falls= PT OT consulting may need skilled nursing home   Subjective:   Req O2 overnight but denies dyspnea/ fever/ chills. States that the pain over the TC tract and neck is much better but still present.    Objective:   BP (!) 109/49 (BP Location: Right Arm)   Pulse 61   Temp 97.9 F (36.6 C) (Oral)   Resp 19   Ht 5\' 3"  (1.6 m)   Wt 99.1 kg   SpO2 100%   BMI 38.70 kg/m   Intake/Output Summary (Last 24 hours) at 06/23/2020 0710 Last data filed at 06/22/2020 1234 Gross per 24 hour  Intake 240 ml  Output 400 ml  Net -160 ml   Weight change:   Physical Exam: General: Obese pleasant woman NAD Neck: Supple, no JVD appreciated, right IJ area minimal erythema and much less tender but still tender Heart: RRR no MRG Lungs: CTA nonlabored breathing Abdomen: Obese, BS positive normoactive, ostomy bag with black liquid stool. Extremities: Trace bipedal edema, thigh edema Neuro: Alert O x3 moves all extremities Dialysis Access: Left femoral AV Gore-Tex graft no bruit no thrill multiple upper  extremity access sites clotted, ecchymosis higher up in thigh  Imaging: [place] CHEST SOFT TISSUE  Result Date: 06/21/2020 CLINICAL DATA:  80 year old female with removal of right IJ dialysis catheter. Redness over the right chest and neck. Concern for abscess. EXAM: ULTRASOUND OF HEAD/NECK SOFT TISSUES TECHNIQUE: Ultrasound examination of the head and neck soft tissues was performed in the area of clinical concern. COMPARISON:  None. FINDINGS: Targeted sonographic images  of the soft tissues of the chest and right side of the neck in the region of the clinical concern was performed. There is heterogeneity of the soft tissues with complex fluid and edema tracking from the IJ into the soft tissues along the course of the catheter. A component of the collection in the right neck measures approximately 2.6 x 0.5 x 2.3 cm. No drainable fluid collection or abscess identified. There is mild hyperemia of the surrounding tissue. A 5 x 7 mm nodular density along the periphery of the lumen of the right IJ may represent a clot. IMPRESSION: 1. Complex fluid and edema tracking from the right IJ into the soft tissues along the course of the catheter. No drainable fluid collection or abscess. 2. Probable small clot within the lumen of the IJ at the insertion of the catheter. These results will be called to the ordering clinician or representative by the Radiologist Assistant, and communication documented in the PACS or [employer]. Electronically Signed   By: Anner Crete M.D.   On: 06/21/2020 21:51    Labs: BMET Recent Labs  Lab 06/20/20 1649 06/21/20 0226 06/22/20 0022 06/23/20 0111  NA 139 138  --  133*  K 3.7 3.9  --  3.9  CL 99 99  --  98  CO2 30 30  --  26  GLUCOSE 140* 101*  --  144*  BUN 9 10  --  31*  CREATININE 3.46* 4.08*  --  6.26*  CALCIUM 8.0* 8.1*  --  8.7*  PHOS  --   --  4.5  --    CBC Recent Labs  Lab 06/20/20 1649 06/21/20 0226 06/23/20 0111  WBC 13.1* 13.5* 7.2  NEUTROABS 11.1*  --  4.6  HGB 11.5* 10.1* 9.8*  HCT 37.4 33.3* 31.2*  MCV 100.0 101.5* 98.4  PLT 230 203 226    Medications:    . amiodarone  200 mg Oral Daily  . Chlorhexidine Gluconate Cloth  6 each Topical Daily  . clonazePAM  1 mg Oral Daily  . estradiol  1 mg Oral Daily  . famotidine  20 mg Oral Q2000  . insulin aspart  0-5 Units Subcutaneous QHS  . insulin aspart  0-6 Units Subcutaneous TID WC  . levothyroxine  175 mcg Oral QAC breakfast  . simvastatin  10 mg  Oral QHS      Otelia Santee, MD 06/23/2020, 7:10 AM

## 2020-06-23 NOTE — Progress Notes (Signed)
[facility] [place]  Subjective  -right IJ tunneled dialysis catheter removed Friday.  No complaints.   Objective (!) 109/49 61 97.9 F (36.6 C) (Oral) 19 100%  Intake/Output Summary (Last 24 hours) at 06/23/2020 0934 Last data filed at 06/22/2020 2227 Gross per 24 hour  Intake 120 ml  Output 550 ml  Net -430 ml    Previous right IJ insertion site with no hematoma status post catheter removal  Laboratory Lab Results: Recent Labs    06/21/20 0226 06/23/20 0111  WBC 13.5* 7.2  HGB 10.1* 9.8*  HCT 33.3* 31.2*  PLT 203 226   BMET Recent Labs    06/21/20 0226 06/23/20 0111  NA 138 133*  K 3.9 3.9  CL 99 98  CO2 30 26  GLUCOSE 101* 144*  BUN 10 31*  CREATININE 4.08* 6.26*  CALCIUM 8.1* 8.7*    COAG Lab Results  Component Value Date   INR 1.6 (H) 06/20/2020   INR 1.1 05/19/2019   INR 1.2 10/05/2018   No results found for: PTT  Assessment/Planning:  80 year old female with end-stage renal disease underwent right IJ tunneled dialysis catheter removal Friday given sepsis and MSSA bacteremia.  ID is recommending waiting at least 72 hours with no growth on blood cultures before placing new line.  I have tentatively posted her for Tuesday in the OR for [facility] with Dr. Scot Dock.  Can certainly delay if her cultures are still positive.  Marty Heck 06/23/2020 9:34 AM --

## 2020-06-23 NOTE — Progress Notes (Signed)
PROGRESS NOTE    [name]  ZDG:[id number] DOB: 05-Jan-1941 DOA: 06/20/2020 PCP: Wannetta Sender, FNP    Brief Narrative:  80 year old female with extensive medical issues, ESRD on hemodialysis TTS schedule, paroxysmal A. fib on Eliquis, chronic respiratory failure on home oxygen, obesity, coronary artery disease, combined chronic heart failure, type 2 diabetes, hypertension, hyperlipidemia, hypothyroidism, diverticulosis and multiple comorbidities recently underwent tunneled IJ catheter placement on 5/20 due to occlusion of left femoral dialysis catheter.  She has frequent fall.  Also fell on 5/21.  Was complaining of right neck pain after catheter placement.  Continue to complain of back pain and right hip pain so came to ER. In the emergency room febrile and tachycardic on arrival.  Had received dialysis on 5/26 the day of admission.  COVID-19 negative.  Chest x-ray with streaky opacity left lung base.  Head CT negative.  X-ray of the right hip and pelvis without any injuries.  Started on daptomycin and ceftriaxone after blood cultures and admit to the hospital.  Right tunneled IJ catheter was obviously infected on presentation. Blood cultures with MSSA bacteremia.  Assessment & Plan:   Principal Problem:   MSSA bacteremia Active Problems:   Hypothyroidism   Hyperlipidemia   Atrial fibrillation ([place])   ESRD (end stage renal disease) ([place])  Severe sepsis secondary to MSSA bacteremia, infected indwelling tunneled dialysis catheter: 5/26, blood cultures with MSSA 5/27, repeat blood cultures still growing MSSA, was drawn while catheter was a still in.  Repeat blood cultures were provided too early. 5/27, removal of right IJ permacath. Repeat blood cultures today. Currently on cefazolin. Hemodynamically stabilizing.  TEE pending.  ESRD on HD: TTS schedule . Received HD 5/26.   Closely monitoring and trying to defer catheter until Tuesday if blood cultures negative from  today.  Paroxysmal atrial fibrillation: Patient currently in sinus rhythm.  Eliquis on hold until permacath.  On amiodarone.  No indication for bridging.  Type 2 diabetes on insulin: Remains on sliding scale.  Stable.  Chronic respiratory failure: No change in oxygen requirement.  Fairly stable.  Debility and frailty/frequent falls: Work with PT OT.  Lives at home with family.    DVT prophylaxis: Place and maintain sequential compression device Start: 06/20/20 2134   Code Status: Full code Family Communication: Daughter on the phone called and updated 5/28. Disposition Plan: Status is: Inpatient  Remains inpatient appropriate because:Ongoing diagnostic testing needed not appropriate for outpatient work up and Inpatient level of care appropriate due to severity of illness   Dispo: The patient is from: Home              Anticipated d/c is to: Home with home health.              Patient currently is not medically stable to d/c.   Difficult to place patient No   Consultants:  Vascular surgery Nephrology Infectious disease  Procedures:   None  Antimicrobials:   Daptomycin and Rocephin 5/26--5/27  Cefazolin 5/28---     Subjective: Patient seen and examined.  No new events.  Denies any complaints. She was a scared to know that he has to have another line and that can get infected.  She also realizes that she has no other options to get dialysis.   Objective: Vitals:   06/22/20 1225 06/22/20 2009 06/23/20 0505 06/23/20 1139  BP: 98/66 104/67 (!) 109/49 (!) 99/54  Pulse: 60 63 61 62  Resp: 19 17 19 19   Temp: 98.7  F (37.1 C) 98.2 F (36.8 C) 97.9 F (36.6 C) 98 F (36.7 C)  TempSrc: Oral Oral Oral Oral  SpO2: 91% 98% 100% 99%  Weight:      Height:        Intake/Output Summary (Last 24 hours) at 06/23/2020 1203 Last data filed at 06/22/2020 2227 Gross per 24 hour  Intake 120 ml  Output 150 ml  Net -30 ml   Filed Weights   06/20/20 2044 06/21/20 0254   Weight: 99.5 kg 99.1 kg    Examination:  General exam: Appears calm and comfortable. Currently on room air that she uses at home.  Frail and debilitated.  Not in any distress. Respiratory system: Clear to auscultation. Respiratory effort normal.  Mostly bilateral clear. Cardiovascular system: S1 & S2 heard, RRR.  2+ bilateral pedal edema.  Right leg more than left. Right chest wall incision site mildly tender with no fluctuation.  Gastrointestinal system: Abdomen is nondistended, soft and nontender. No organomegaly or masses felt. Normal bowel sounds heard. Patient has ostomy bag with brown liquidy stool. Central nervous system: Alert and oriented. No focal neurological deficits.  Generalized weakness.    Data Reviewed: I have personally reviewed following labs and imaging studies  CBC: Recent Labs  Lab 06/20/20 1649 06/21/20 0226 06/23/20 0111  WBC 13.1* 13.5* 7.2  NEUTROABS 11.1*  --  4.6  HGB 11.5* 10.1* 9.8*  HCT 37.4 33.3* 31.2*  MCV 100.0 101.5* 98.4  PLT 230 203 793   Basic Metabolic Panel: Recent Labs  Lab 06/20/20 1649 06/21/20 0226 06/22/20 0022 06/23/20 0111  NA 139 138  --  133*  K 3.7 3.9  --  3.9  CL 99 99  --  98  CO2 30 30  --  26  GLUCOSE 140* 101*  --  144*  BUN 9 10  --  31*  CREATININE 3.46* 4.08*  --  6.26*  CALCIUM 8.0* 8.1*  --  8.7*  PHOS  --   --  4.5  --    GFR: Estimated Creatinine Clearance: 8.4 mL/min (A) (by C-G formula based on SCr of 6.26 mg/dL (H)). Liver Function Tests: Recent Labs  Lab 06/20/20 1649  AST 30  ALT 17  ALKPHOS 121  BILITOT 1.1  PROT 6.6  ALBUMIN 2.9*   No results for input(s): LIPASE, AMYLASE in the last 168 hours. No results for input(s): AMMONIA in the last 168 hours. Coagulation Profile: Recent Labs  Lab 06/20/20 1649  INR 1.6*   Cardiac Enzymes: Recent Labs  Lab 06/21/20 0226  CKTOTAL 18*   BNP (last 3 results) No results for input(s): PROBNP in the last 8760 hours. HbA1C: Recent Labs     06/21/20 0226  HGBA1C 5.5   CBG: Recent Labs  Lab 06/22/20 1633 06/22/20 2011 06/23/20 0504 06/23/20 0728 06/23/20 1138  GLUCAP 174* 210* 134* 141* 175*   Lipid Profile: No results for input(s): CHOL, HDL, LDLCALC, TRIG, CHOLHDL, LDLDIRECT in the last 72 hours. Thyroid Function Tests: No results for input(s): TSH, T4TOTAL, FREET4, T3FREE, THYROIDAB in the last 72 hours. Anemia Panel: No results for input(s): VITAMINB12, FOLATE, FERRITIN, TIBC, IRON, RETICCTPCT in the last 72 hours. Sepsis Labs: Recent Labs  Lab 06/20/20 2209 06/21/20 0226 06/21/20 0714 06/21/20 1024  LATICACIDVEN 3.7* 2.4* 1.8 1.7    Recent Results (from the past 240 hour(s))  Blood culture (routine x 2)     Status: Abnormal   Collection Time: 06/20/20  4:55 PM   Specimen: BLOOD  RIGHT HAND  Result Value Ref Range Status   Specimen Description BLOOD RIGHT HAND  Final   Special Requests   Final    BOTTLES DRAWN AEROBIC AND ANAEROBIC Blood Culture results may not be optimal due to an inadequate volume of blood received in culture bottles   Culture  Setup Time   Final    GRAM POSITIVE COCCI IN CLUSTERS IN BOTH AEROBIC AND ANAEROBIC BOTTLES CRITICAL VALUE NOTED.  VALUE IS CONSISTENT WITH PREVIOUSLY REPORTED AND CALLED VALUE.    Culture (A)  Final    STAPHYLOCOCCUS AUREUS SUSCEPTIBILITIES PERFORMED ON PREVIOUS CULTURE WITHIN THE LAST 5 DAYS. Performed at [facility], [place] [address] [place], [place] [address]    Report Status 06/23/2020 FINAL  Final  Resp Panel by RT-PCR (Flu A&B, Covid) Nasopharyngeal Swab     Status: None   Collection Time: 06/20/20  5:51 PM   Specimen: Nasopharyngeal Swab; Nasopharyngeal(NP) swabs in vial transport medium  Result Value Ref Range Status   SARS Coronavirus 2 by RT PCR NEGATIVE NEGATIVE Final    Comment: (NOTE) SARS-CoV-2 target nucleic acids are NOT DETECTED.  The SARS-CoV-2 RNA is generally detectable in upper respiratory specimens during the acute  phase of infection. The lowest concentration of SARS-CoV-2 viral copies this assay can detect is 138 copies/mL. A negative result does not preclude SARS-Cov-2 infection and should not be used as the sole basis for treatment or other patient management decisions. A negative result may occur with  improper specimen collection/handling, submission of specimen other than nasopharyngeal swab, presence of viral mutation(s) within the areas targeted by this assay, and inadequate number of viral copies(<138 copies/mL). A negative result must be combined with clinical observations, patient history, and epidemiological information. The expected result is Negative.  Fact Sheet for Patients:  EntrepreneurPulse.com.au  Fact Sheet for Healthcare Providers:  IncredibleEmployment.be  This test is no t yet approved or cleared by the [place] FDA and  has been authorized for detection and/or diagnosis of SARS-CoV-2 by FDA under an Emergency Use Authorization (EUA). This EUA will remain  in effect (meaning this test can be used) for the duration of the COVID-19 declaration under Section 564(b)(1) of the Act, 21 U.S.C.section 360bbb-3(b)(1), unless the authorization is terminated  or revoked sooner.       Influenza A by PCR NEGATIVE NEGATIVE Final   Influenza B by PCR NEGATIVE NEGATIVE Final    Comment: (NOTE) The Xpert Xpress SARS-CoV-2/FLU/RSV plus assay is intended as an aid in the diagnosis of influenza from Nasopharyngeal swab specimens and should not be used as a sole basis for treatment. Nasal washings and aspirates are unacceptable for Xpert Xpress SARS-CoV-2/FLU/RSV testing.  Fact Sheet for Patients: EntrepreneurPulse.com.au  Fact Sheet for Healthcare Providers: IncredibleEmployment.be  This test is not yet approved or cleared by the [place] FDA and has been authorized for detection and/or diagnosis of  SARS-CoV-2 by FDA under an Emergency Use Authorization (EUA). This EUA will remain in effect (meaning this test can be used) for the duration of the COVID-19 declaration under Section 564(b)(1) of the Act, 21 U.S.C. section 360bbb-3(b)(1), unless the authorization is terminated or revoked.  Performed at [facility], [place] [address], [place], [place] [address]   Blood culture (routine x 2)     Status: Abnormal   Collection Time: 06/20/20  6:25 PM   Specimen: Right Antecubital; Blood  Result Value Ref Range Status   Specimen Description RIGHT ANTECUBITAL  Final   Special  Requests   Final    BOTTLES DRAWN AEROBIC ONLY Blood Culture adequate volume   Culture  Setup Time   Final    GRAM POSITIVE COCCI IN CLUSTERS AEROBIC BOTTLE ONLY CRITICAL RESULT CALLED TO, READ BACK BY AND VERIFIED WITH: PHARMD M.YATES AT 4235 ON 06/21/2020 BY T.SAAD. Performed at [facility], [place] [address] [place], [place] [address]    Culture STAPHYLOCOCCUS AUREUS (A)  Final   Report Status 06/23/2020 FINAL  Final   Organism ID, Bacteria STAPHYLOCOCCUS AUREUS  Final      Susceptibility   Staphylococcus aureus - MIC*    CIPROFLOXACIN <=0.5 SENSITIVE Sensitive     ERYTHROMYCIN RESISTANT Resistant     GENTAMICIN <=0.5 SENSITIVE Sensitive     OXACILLIN 0.5 SENSITIVE Sensitive     TETRACYCLINE <=1 SENSITIVE Sensitive     VANCOMYCIN <=0.5 SENSITIVE Sensitive     TRIMETH/SULFA <=10 SENSITIVE Sensitive     CLINDAMYCIN RESISTANT Resistant     RIFAMPIN <=0.5 SENSITIVE Sensitive     Inducible Clindamycin POSITIVE Resistant     * STAPHYLOCOCCUS AUREUS  Blood Culture ID Panel (Reflexed)     Status: Abnormal   Collection Time: 06/20/20  6:25 PM  Result Value Ref Range Status   Enterococcus faecalis NOT DETECTED NOT DETECTED Final   Enterococcus Faecium NOT DETECTED NOT DETECTED Final   Listeria monocytogenes NOT DETECTED NOT DETECTED Final   Staphylococcus species DETECTED (A) NOT DETECTED Final    Staphylococcus aureus (BCID) DETECTED (A) NOT DETECTED Final    Comment: CRITICAL RESULT CALLED TO, READ BACK BY AND VERIFIED WITH: M. PHAM PHARMD AT 1915 06/21/20 BY D. VANHOOK    Staphylococcus epidermidis NOT DETECTED NOT DETECTED Final   Staphylococcus lugdunensis NOT DETECTED NOT DETECTED Final   Streptococcus species NOT DETECTED NOT DETECTED Final   Streptococcus agalactiae NOT DETECTED NOT DETECTED Final   Streptococcus pneumoniae NOT DETECTED NOT DETECTED Final   Streptococcus pyogenes NOT DETECTED NOT DETECTED Final   A.calcoaceticus-baumannii NOT DETECTED NOT DETECTED Final   Bacteroides fragilis NOT DETECTED NOT DETECTED Final   Enterobacterales NOT DETECTED NOT DETECTED Final   Enterobacter cloacae complex NOT DETECTED NOT DETECTED Final   Escherichia coli NOT DETECTED NOT DETECTED Final   Klebsiella aerogenes NOT DETECTED NOT DETECTED Final   Klebsiella oxytoca NOT DETECTED NOT DETECTED Final   Klebsiella pneumoniae NOT DETECTED NOT DETECTED Final   Proteus species NOT DETECTED NOT DETECTED Final   Salmonella species NOT DETECTED NOT DETECTED Final   Serratia marcescens NOT DETECTED NOT DETECTED Final   Haemophilus influenzae NOT DETECTED NOT DETECTED Final   Neisseria meningitidis NOT DETECTED NOT DETECTED Final   Pseudomonas aeruginosa NOT DETECTED NOT DETECTED Final   Stenotrophomonas maltophilia NOT DETECTED NOT DETECTED Final   Candida albicans NOT DETECTED NOT DETECTED Final   Candida auris NOT DETECTED NOT DETECTED Final   Candida glabrata NOT DETECTED NOT DETECTED Final   Candida krusei NOT DETECTED NOT DETECTED Final   Candida parapsilosis NOT DETECTED NOT DETECTED Final   Candida tropicalis NOT DETECTED NOT DETECTED Final   Cryptococcus neoformans/gattii NOT DETECTED NOT DETECTED Final   Meth resistant mecA/C and MREJ NOT DETECTED NOT DETECTED Final    Comment: Performed at [facility], [address], [place], [place] [address]  Culture, blood (routine  x 2)     Status: None (Preliminary result)   Collection Time: 06/21/20  3:01 PM   Specimen: BLOOD  Result Value Ref Range Status  Specimen Description BLOOD LEFT ANTECUBITAL  Final   Special Requests   Final    BOTTLES DRAWN AEROBIC ONLY Blood Culture results may not be optimal due to an inadequate volume of blood received in culture bottles   Culture  Setup Time   Final    GRAM POSITIVE COCCI IN CLUSTERS AEROBIC BOTTLE ONLY CRITICAL VALUE NOTED.  VALUE IS CONSISTENT WITH PREVIOUSLY REPORTED AND CALLED VALUE.    Culture   Final    GRAM POSITIVE COCCI CULTURE REINCUBATED FOR BETTER GROWTH Performed at [facility], [place] [address] [place], [place] [address]    Report Status PENDING  Incomplete  Culture, blood (routine x 2)     Status: None (Preliminary result)   Collection Time: 06/21/20  3:10 PM   Specimen: BLOOD RIGHT HAND  Result Value Ref Range Status   Specimen Description BLOOD RIGHT HAND  Final   Special Requests   Final    BOTTLES DRAWN AEROBIC ONLY Blood Culture results may not be optimal due to an inadequate volume of blood received in culture bottles   Culture  Setup Time   Final    GRAM POSITIVE COCCI IN CLUSTERS AEROBIC BOTTLE ONLY CRITICAL VALUE NOTED.  VALUE IS CONSISTENT WITH PREVIOUSLY REPORTED AND CALLED VALUE. Performed at [facility], [place] [address] [place], [place] [address]    Culture GRAM POSITIVE COCCI  Final   Report Status PENDING  Incomplete         Radiology Studies: [place] CHEST SOFT TISSUE  Result Date: 06/21/2020 CLINICAL DATA:  80 year old female with removal of right IJ dialysis catheter. Redness over the right chest and neck. Concern for abscess. EXAM: ULTRASOUND OF HEAD/NECK SOFT TISSUES TECHNIQUE: Ultrasound examination of the head and neck soft tissues was performed in the area of clinical concern. COMPARISON:  None. FINDINGS: Targeted sonographic images of the soft tissues of the chest and right side of the neck in the region  of the clinical concern was performed. There is heterogeneity of the soft tissues with complex fluid and edema tracking from the IJ into the soft tissues along the course of the catheter. A component of the collection in the right neck measures approximately 2.6 x 0.5 x 2.3 cm. No drainable fluid collection or abscess identified. There is mild hyperemia of the surrounding tissue. A 5 x 7 mm nodular density along the periphery of the lumen of the right IJ may represent a clot. IMPRESSION: 1. Complex fluid and edema tracking from the right IJ into the soft tissues along the course of the catheter. No drainable fluid collection or abscess. 2. Probable small clot within the lumen of the IJ at the insertion of the catheter. These results will be called to the ordering clinician or representative by the Radiologist Assistant, and communication documented in the PACS or [employer]. Electronically Signed   By: Anner Crete M.D.   On: 06/21/2020 21:51        Scheduled Meds: . amiodarone  200 mg Oral Daily  . Chlorhexidine Gluconate Cloth  6 each Topical Daily  . clonazePAM  1 mg Oral Daily  . estradiol  1 mg Oral Daily  . famotidine  20 mg Oral Q2000  . insulin aspart  0-5 Units Subcutaneous QHS  . insulin aspart  0-6 Units Subcutaneous TID WC  . levothyroxine  175 mcg Oral QAC breakfast  . simvastatin  10 mg Oral QHS   Continuous Infusions: .  ceFAZolin (ANCEF) IV 1 g (06/23/20  0940)     LOS: 3 days    Time spent: 30 minutes    Barb Merino, MD [facility] Pager [phone]

## 2020-06-23 NOTE — Progress Notes (Signed)
Request for IV restart received.  Patient is currently out of bed in chair between the bed and window.  Requested that [facility], RN notify IV team once patient is back in bed for restart.

## 2020-06-23 NOTE — Progress Notes (Signed)
ID PROGRESS NOTE   Both sets of  blood cx from 5/27 have turned positive. Concern for her ongoing bacteremia despite temp IJ removal.   Micro:5/26 Staphylococcus aureus      MIC    CIPROFLOXACIN <=0.5 SENSI... Sensitive    CLINDAMYCIN RESISTANT  Resistant    ERYTHROMYCIN RESISTANT  Resistant    GENTAMICIN <=0.5 SENSI... Sensitive    Inducible Clindamycin POSITIVE  Resistant    OXACILLIN 0.5 SENSITIVE  Sensitive    RIFAMPIN <=0.5 SENSI... Sensitive    TETRACYCLINE <=1 SENSITIVE  Sensitive    TRIMETH/SULFA <=10 SENSIT... Sensitive    VANCOMYCIN <=0.5 SENSI... Sensitive     A/P:persistent MSSA bacteremia concerning for endocarditis despite temp HD line removal  Will repeat blood cx today  Will need TEE to evaluate for endocarditis  [name] B. Spencer for Infectious Diseases [phone]

## 2020-06-24 DIAGNOSIS — B9561 Methicillin susceptible Staphylococcus aureus infection as the cause of diseases classified elsewhere: Secondary | ICD-10-CM | POA: Diagnosis not present

## 2020-06-24 DIAGNOSIS — R7881 Bacteremia: Secondary | ICD-10-CM | POA: Diagnosis not present

## 2020-06-24 LAB — CULTURE, BLOOD (ROUTINE X 2)

## 2020-06-24 LAB — BASIC METABOLIC PANEL
Anion gap: 10 (ref 5–15)
BUN: 37 mg/dL — ABNORMAL HIGH (ref 8–23)
CO2: 24 mmol/L (ref 22–32)
Calcium: 8.7 mg/dL — ABNORMAL LOW (ref 8.9–10.3)
Chloride: 99 mmol/L (ref 98–111)
Creatinine, Ser: 6.99 mg/dL — ABNORMAL HIGH (ref 0.44–1.00)
GFR, Estimated: 6 mL/min — ABNORMAL LOW (ref >60)
Glucose, Bld: 170 mg/dL — ABNORMAL HIGH (ref 70–99)
Potassium: 4.3 mmol/L (ref 3.5–5.1)
Sodium: 133 mmol/L — ABNORMAL LOW (ref 135–145)

## 2020-06-24 LAB — GLUCOSE, CAPILLARY
Glucose-Capillary: 136 mg/dL — ABNORMAL HIGH (ref 70–99)
Glucose-Capillary: 156 mg/dL — ABNORMAL HIGH (ref 70–99)
Glucose-Capillary: 201 mg/dL — ABNORMAL HIGH (ref 70–99)
Glucose-Capillary: 178 mg/dL — ABNORMAL HIGH (ref 70–99)
Glucose-Capillary: 169 mg/dL — ABNORMAL HIGH (ref 70–99)

## 2020-06-24 MED ORDER — CHLORHEXIDINE GLUCONATE CLOTH 2 % EX PADS: 6.0000 | MEDICATED_PAD | Freq: Every day | CUTANEOUS | Status: DC

## 2020-06-24 NOTE — H&P (View-Only) (Signed)
[facility]  Subjective  -afebrile and white count normalized.   Objective 114/61 (!) 57 97.8 F (36.6 C) (Oral) 13 93%  Intake/Output Summary (Last 24 hours) at 06/24/2020 0951 Last data filed at 06/23/2020 2156 Gross per 24 hour  Intake 120 ml  Output 775 ml  Net -655 ml    No issues since removal of right IJ tunnel catheter  Laboratory Lab Results: Recent Labs    06/23/20 0111  WBC 7.2  HGB 9.8*  HCT 31.2*  PLT 226   BMET Recent Labs    06/23/20 0111 06/24/20 0028  NA 133* 133*  K 3.9 4.3  CL 98 99  CO2 26 24  GLUCOSE 144* 170*  BUN 31* 37*  CREATININE 6.26* 6.99*  CALCIUM 8.7* 8.7*    COAG Lab Results  Component Value Date   INR 1.6 (H) 06/20/2020   INR 1.1 05/19/2019   INR 1.2 10/05/2018   No results found for: PTT  Assessment/Planning:  80 year old female admitted with sepsis and vascular surgery removed right IJ tunneled dialysis catheter.  Cultures from 06/21/2020 are positive.  I discussed with Dr. Sloan Leiter about potentially delaying [facility] catheter placement and having temporary line placed in IR.  He feels the cultures from 06/21/2020 were drawn too early and clinically she is much better.  She has cultures from yesterday that have no growth to date.  If cultures remain negative from yesterday he feels we should leave her on the schedule.  We will tentatively plan to proceed as discussed with her unless her cultures from yesterday are positive and then will need a temporary catheter in IR.  Marty Heck 06/24/2020 9:51 AM --

## 2020-06-24 NOTE — Progress Notes (Signed)
[facility] [place]  Subjective  -afebrile and white count normalized.   Objective 114/61 (!) 57 97.8 F (36.6 C) (Oral) 13 93%  Intake/Output Summary (Last 24 hours) at 06/24/2020 0951 Last data filed at 06/23/2020 2156 Gross per 24 hour  Intake 120 ml  Output 775 ml  Net -655 ml    No issues since removal of right IJ tunnel catheter  Laboratory Lab Results: Recent Labs    06/23/20 0111  WBC 7.2  HGB 9.8*  HCT 31.2*  PLT 226   BMET Recent Labs    06/23/20 0111 06/24/20 0028  NA 133* 133*  K 3.9 4.3  CL 98 99  CO2 26 24  GLUCOSE 144* 170*  BUN 31* 37*  CREATININE 6.26* 6.99*  CALCIUM 8.7* 8.7*    COAG Lab Results  Component Value Date   INR 1.6 (H) 06/20/2020   INR 1.1 05/19/2019   INR 1.2 10/05/2018   No results found for: PTT  Assessment/Planning:  80 year old female admitted with sepsis and vascular surgery removed right IJ tunneled dialysis catheter.  Cultures from 06/21/2020 are positive.  I discussed with Dr. Sloan Leiter about potentially delaying [facility] catheter placement and having temporary line placed in IR.  He feels the cultures from 06/21/2020 were drawn too early and clinically she is much better.  She has cultures from yesterday that have no growth to date.  If cultures remain negative from yesterday he feels we should leave her on the schedule.  We will tentatively plan to proceed as discussed with her unless her cultures from yesterday are positive and then will need a temporary catheter in IR.  Marty Heck 06/24/2020 9:51 AM --

## 2020-06-24 NOTE — TOC Progression Note (Signed)
Transition of Care [facility]) - Progression Note    Patient Details  Name: [name] MRN: [id number] Date of Birth: 09-04-1940  Transition of Care [facility]) CM/SW [place], [place] Phone Number: 06/24/2020, 12:17 PM  Clinical Narrative:    CSW attempted to call pt's room, called her cell phone and dtr, [name] answered. Dtr noted that she had not been able to call into pt's room and had been trying to get it fixed with the nurses. SNF recommendations were discussed and she said she would like to discuss it with her mother and follow up with CSW later. She did note they have a lot of support at home so they will see what the pt would like to do. SW will continue to follow for DC planning.   Expected Discharge Plan: Skilled Nursing Facility Barriers to Discharge: Continued Medical Work up  Expected Discharge Plan and Services Expected Discharge Plan: [place]                                               Social Determinants of Health (SDOH) Interventions    Readmission Risk Interventions No flowsheet data found.

## 2020-06-24 NOTE — Anesthesia Preprocedure Evaluation (Addendum)
Anesthesia Evaluation  Patient identified by MRN, date of birth, ID band Patient awake    Reviewed: Allergy & Precautions, NPO status , Patient's Chart, lab work & pertinent test results  History of Anesthesia Complications (+) PONV and history of anesthetic complications  Airway Mallampati: I  TM Distance: >3 FB Neck ROM: Full    Dental  (+) Poor Dentition, Missing, Dental Advisory Given   Pulmonary shortness of breath, asthma , sleep apnea and Oxygen sleep apnea ,    Pulmonary exam normal        Cardiovascular hypertension, + CAD and +CHF  Normal cardiovascular exam+ dysrhythmias Atrial Fibrillation   IMPRESSIONS    1. Left ventricular ejection fraction, by estimation, is 50 to 55%. The  left ventricle has low normal function. The left ventricle has no regional  wall motion abnormalities. Left ventricular diastolic parameters are  consistent with Grade III diastolic  dysfunction (restrictive). Elevated left ventricular end-diastolic  pressure.  2. Right ventricular systolic function is normal. The right ventricular  size is moderately enlarged. Tricuspid regurgitation signal is inadequate  for assessing PA pressure.  3. Left atrial size was moderately dilated.  4. Right atrial size was moderately dilated.  5. The mitral valve is abnormal. Trivial mitral valve regurgitation. No  evidence of mitral stenosis. Moderate mitral annular calcification.  6. The aortic valve is tricuspid. There is moderate calcification of the  aortic valve. There is moderate thickening of the aortic valve. Aortic  valve regurgitation is not visualized. Mild aortic valve sclerosis is  present, with no evidence of aortic  valve stenosis.  7. The inferior vena cava is normal in size with greater than 50%  respiratory variability, suggesting right atrial pressure of 3 mmHg.    Neuro/Psych  Headaches, PSYCHIATRIC DISORDERS Anxiety Depression  Dementia    GI/Hepatic Neg liver ROS, hiatal hernia, GERD  Medicated and Controlled,IBS   Endo/Other  diabetes, Insulin DependentHypothyroidism Morbid obesity  Renal/GU ESRF and DialysisRenal diseaseOn HD T, R, Sat     Musculoskeletal  (+) Arthritis , Gout   Abdominal (+) - obese,   Peds  Hematology HLD   Anesthesia Other Findings   Reproductive/Obstetrics                            Anesthesia Physical  Anesthesia Plan  ASA: IV  Anesthesia Plan: MAC   Post-op Pain Management:    Induction: Intravenous  PONV Risk Score and Plan: 3 and Propofol infusion, Treatment may vary due to age or medical condition and Ondansetron  Airway Management Planned: Nasal Cannula, Simple Face Mask and Natural Airway  Additional Equipment:   Intra-op Plan:   Post-operative Plan:   Informed Consent: I have reviewed the patients History and Physical, chart, labs and discussed the procedure including the risks, benefits and alternatives for the proposed anesthesia with the patient or authorized representative who has indicated his/her understanding and acceptance.     Dental advisory given  Plan Discussed with: Surgeon, Anesthesiologist and CRNA  Anesthesia Plan Comments:        Anesthesia Quick Evaluation

## 2020-06-24 NOTE — Progress Notes (Signed)
Consulted to restart I.V. Patient did not want ultrasound started I.V. Requested I.V. in hand. Able to find small vein lateral wrist.

## 2020-06-24 NOTE — Progress Notes (Signed)
[facility] Progress Note   Subjective:   Seated on bedside, feeling well with no new concerns. Denies SOB, CP, palpitations, dizziness, abdominal pain and nausea. Mentions she is interested in changing HD units, will discuss with renal navigator tomorrow.   BCx from 5/27 still positive, ID recommending TEE. Repeat cultures drawn yesterday.   Objective Vitals:   06/23/20 0505 06/23/20 1139 06/23/20 1956 06/24/20 0453  BP: (!) 109/49 (!) 99/54 128/61 114/61  Pulse: 61 62 62 (!) 57  Resp: 19 19 18 13   Temp: 97.9 F (36.6 C) 98 F (36.7 C) 98 F (36.7 C) 97.8 F (36.6 C)  TempSrc: Oral Oral Oral Oral  SpO2: 100% 99% 100% 93%  Weight:      Height:       Physical Exam General: Well developed female, alert and in NAD Heart: RRR, no murmurs, rubs or gallops Lungs: CTA bilaterally without wheezing, rhonchi or rales Abdomen: Soft, non-distended, +BS Extremities: 1+ edema bilateral lower extremities Dialysis Access: Bandage over prior [facility] site, no current access  Additional Objective Labs: Basic Metabolic Panel: Recent Labs  Lab 06/21/20 0226 06/22/20 0022 06/23/20 0111 06/24/20 0028  NA 138  --  133* 133*  K 3.9  --  3.9 4.3  CL 99  --  98 99  CO2 30  --  26 24  GLUCOSE 101*  --  144* 170*  BUN 10  --  31* 37*  CREATININE 4.08*  --  6.26* 6.99*  CALCIUM 8.1*  --  8.7* 8.7*  PHOS  --  4.5  --   --    Liver Function Tests: Recent Labs  Lab 06/20/20 1649  AST 30  ALT 17  ALKPHOS 121  BILITOT 1.1  PROT 6.6  ALBUMIN 2.9*   CBC: Recent Labs  Lab 06/20/20 1649 06/21/20 0226 06/23/20 0111  WBC 13.1* 13.5* 7.2  NEUTROABS 11.1*  --  4.6  HGB 11.5* 10.1* 9.8*  HCT 37.4 33.3* 31.2*  MCV 100.0 101.5* 98.4  PLT 230 203 226   Blood Culture    Component Value Date/Time   SDES BLOOD RIGHT ARM 06/23/2020 1305   SPECREQUEST  06/23/2020 1305    BOTTLES DRAWN AEROBIC ONLY Blood Culture results may not be optimal due to an inadequate volume of blood  received in culture bottles   CULT  06/23/2020 1305    NO GROWTH < 24 HOURS Performed at [facility], [address] [place], [place] [address]    REPTSTATUS PENDING 06/23/2020 1305    Cardiac Enzymes: Recent Labs  Lab 06/21/20 0226  CKTOTAL 18*   CBG: Recent Labs  Lab 06/23/20 1138 06/23/20 1608 06/23/20 2016 06/24/20 0459 06/24/20 0744  GLUCAP 175* 177* 148* 136* 156*   Studies/Results: ECHOCARDIOGRAM COMPLETE  Result Date: 06/23/2020    ECHOCARDIOGRAM REPORT   Patient Name:   [name] Date of Exam: 06/23/2020 Medical Rec #:  [id number]        Height:       63.0 in Accession #:    [id number]       Weight:       218.5 lb Date of Birth:  Jan 18, 1941        BSA:          2.008 m Patient Age:    80 years         BP:           99/54 mmHg Patient Gender: F  HR:           56 bpm. Exam Location:  Inpatient Procedure: 2D Echo, Cardiac Doppler and Color Doppler Indications:    Bacteremia  History:        Patient has prior history of Echocardiogram examinations, most                 recent 11/22/2016. CHF, Previous Myocardial Infarction and CAD;                 Risk Factors:Dyslipidemia and Diabetes. ESRD. Hx atrial                 fibrillation. ESRD. CKD. Patient states she has an old loop                 recorder in place.  Sonographer:    Clayton Lefort RDCS (AE) Referring Phys: [id number] [facility]  Sonographer Comments: No IV access for Definity use at time of echocardiogram. IMPRESSIONS  1. Left ventricular ejection fraction, by estimation, is 50 to 55%. The left ventricle has low normal function. The left ventricle has no regional wall motion abnormalities. Left ventricular diastolic parameters are consistent with Grade III diastolic dysfunction (restrictive). Elevated left ventricular end-diastolic pressure.  2. Right ventricular systolic function is normal. The right ventricular size is moderately enlarged. Tricuspid regurgitation signal is inadequate for assessing  PA pressure.  3. Left atrial size was moderately dilated.  4. Right atrial size was moderately dilated.  5. The mitral valve is abnormal. Trivial mitral valve regurgitation. No evidence of mitral stenosis. Moderate mitral annular calcification.  6. The aortic valve is tricuspid. There is moderate calcification of the aortic valve. There is moderate thickening of the aortic valve. Aortic valve regurgitation is not visualized. Mild aortic valve sclerosis is present, with no evidence of aortic valve stenosis.  7. The inferior vena cava is normal in size with greater than 50% respiratory variability, suggesting right atrial pressure of 3 mmHg. Comparison(s): No significant change from prior study. Conclusion(s)/Recommendation(s): No evidence of valvular vegetations on this transthoracic echocardiogram. Would recommend a transesophageal echocardiogram to exclude infective endocarditis if clinically indicated. FINDINGS  Left Ventricle: Left ventricular ejection fraction, by estimation, is 50 to 55%. The left ventricle has low normal function. The left ventricle has no regional wall motion abnormalities. The left ventricular internal cavity size was normal in size. There is no left ventricular hypertrophy. Left ventricular diastolic parameters are consistent with Grade III diastolic dysfunction (restrictive). Elevated left ventricular end-diastolic pressure. Right Ventricle: The right ventricular size is moderately enlarged. Right vetricular wall thickness was not well visualized. Right ventricular systolic function is normal. Tricuspid regurgitation signal is inadequate for assessing PA pressure. Left Atrium: Left atrial size was moderately dilated. Right Atrium: Right atrial size was moderately dilated. Pericardium: There is no evidence of pericardial effusion. Presence of pericardial fat pad. Mitral Valve: The mitral valve is abnormal. There is moderate thickening of the mitral valve leaflet(s). There is moderate  calcification of the mitral valve leaflet(s). Moderate mitral annular calcification. Trivial mitral valve regurgitation. No evidence of mitral valve stenosis. MV peak gradient, 3.4 mmHg. The mean mitral valve gradient is 1.0 mmHg. Tricuspid Valve: The tricuspid valve is normal in structure. Tricuspid valve regurgitation is trivial. Aortic Valve: The aortic valve is tricuspid. There is moderate calcification of the aortic valve. There is moderate thickening of the aortic valve. Aortic valve regurgitation is not visualized. Mild aortic valve sclerosis is present, with no evidence of aortic valve stenosis.  Aortic valve mean gradient measures 7.0 mmHg. Aortic valve peak gradient measures 13.8 mmHg. Aortic valve area, by VTI measures 1.39 cm. Pulmonic Valve: The pulmonic valve was not well visualized. Pulmonic valve regurgitation is not visualized. Aorta: The aortic root was not well visualized and the aortic root, ascending aorta and aortic arch are all structurally normal, with no evidence of dilitation or obstruction. Venous: The inferior vena cava is normal in size with greater than 50% respiratory variability, suggesting right atrial pressure of 3 mmHg. IAS/Shunts: The atrial septum is grossly normal.  LEFT VENTRICLE PLAX 2D LVIDd:         5.60 cm  Diastology LVIDs:         3.90 cm  LV e' medial:    5.23 cm/s LV PW:         1.10 cm  LV E/e' medial:  19.3 LV IVS:        0.90 cm  LV e' lateral:   6.58 cm/s LVOT diam:     2.00 cm  LV E/e' lateral: 15.3 LV SV:         61 LV SV Index:   30 LVOT Area:     3.14 cm  RIGHT VENTRICLE            IVC RV Basal diam:  4.10 cm    IVC diam: 1.60 cm RV Mid diam:    3.20 cm RV S prime:     9.51 cm/s TAPSE (M-mode): 1.9 cm LEFT ATRIUM             Index       RIGHT ATRIUM           Index LA diam:        4.60 cm 2.29 cm/m  RA Area:     20.70 cm LA Vol (A2C):   91.6 ml 45.63 ml/m RA Volume:   61.90 ml  30.83 ml/m LA Vol (A4C):   78.0 ml 38.85 ml/m LA Biplane Vol: 85.0 ml 42.34  ml/m  AORTIC VALVE AV Area (Vmax):    1.50 cm AV Area (Vmean):   1.44 cm AV Area (VTI):     1.39 cm AV Vmax:           186.00 cm/s AV Vmean:          122.000 cm/s AV VTI:            0.435 m AV Peak Grad:      13.8 mmHg AV Mean Grad:      7.0 mmHg LVOT Vmax:         88.70 cm/s LVOT Vmean:        56.100 cm/s LVOT VTI:          0.193 m LVOT/AV VTI ratio: 0.44  AORTA Ao Root diam: 3.40 cm Ao Asc diam:  3.50 cm MITRAL VALVE MV Area (PHT): 4.31 cm     SHUNTS MV Area VTI:   1.92 cm     Systemic VTI:  0.19 m MV Peak grad:  3.4 mmHg     Systemic Diam: 2.00 cm MV Mean grad:  1.0 mmHg MV Vmax:       0.92 m/s MV Vmean:      47.6 cm/s MV Decel Time: 176 msec MV E velocity: 101.00 cm/s MV A velocity: 36.50 cm/s MV E/A ratio:  2.77 Buford Dresser MD Electronically signed by Buford Dresser MD Signature Date/Time: 06/23/2020/5:59:13 PM    Final    Medications: .  ceFAZolin (  ANCEF) IV 1 g (06/23/20 1822)   . amiodarone  200 mg Oral Daily  . Chlorhexidine Gluconate Cloth  6 each Topical Daily  . clonazePAM  1 mg Oral Daily  . estradiol  1 mg Oral Daily  . famotidine  20 mg Oral Q2000  . insulin aspart  0-5 Units Subcutaneous QHS  . insulin aspart  0-6 Units Subcutaneous TID WC  . levothyroxine  175 mcg Oral QAC breakfast  . simvastatin  10 mg Oral QHS    Outpatient Dialysis Orders: [facility] 3.5hrsTTS EDW98 kg, 2K, 2 CA bath,Heparin none. Accessdid have a right IJ PermCath(now removed)recent clotted left femoral AVGG Calcitriol 0.7 89mcg p.o./HD Mircera60MCG every 2 weeks last given 06/14/20   Assessment/Plan:   1. MSSA bacteremia(etiology thought to be a right IJ PermCath)= ID consulted, currently on Ancef;HD PermCath removed 5/27. BCx from 5/27 still positive but redrawn 5/29 as pt is clinically much improved. VVS planning TDC vs temp cath tomorrow. Appreciate ID and VVS  2. ESRD -HD TTS,last dialysis 5/26 w/ no c/o dyspnea. Renal labs remain stable, no acute indications for  HD today. She is starting to accumulate some LE edema. Reminded to limit fluid intake and will plan for HD tomorrow after new catheter is placed 3. Hypertension/volume -originally hypotensive in ER given IV fluids. Currently BP stable, no excess volume on chest x-ray, mild volume on exam. UF with HD tomorrow as tolerated.  4. Anemia -Hgb 9.8, not due for ESA dose yet 5. Metabolic bone disease -corrected calcium and phos controlled. Continue VDRA and binders 6. Proximal A. Fib-currently sinus rhythm Eliquis on hold on amiodarone plan per admit 7. History of chronic respiratory failure- currently no changes in oxygen requirements, no shortness of breath 8. Diabetes mellitus type 2 per admit 9. Debility and recent falls-PT OT consulting   Anice Paganini, PA-C 06/24/2020, 9:57 AM  [facility] Pager: [phone]

## 2020-06-24 NOTE — Plan of Care (Signed)
  Problem: Fluid Volume: Goal: Hemodynamic stability will improve Outcome: Progressing   Problem: Respiratory: Goal: Ability to maintain adequate ventilation will improve Outcome: Progressing   Problem: Education: Goal: Knowledge of General Education information will improve Description: Including pain rating scale, medication(s)/side effects and non-pharmacologic comfort measures Outcome: Progressing

## 2020-06-24 NOTE — Progress Notes (Signed)
PROGRESS NOTE    [name]  YQI:[id number] DOB: 1940/12/15 DOA: 06/20/2020 PCP: Wannetta Sender, FNP    Brief Narrative:  80 year old female with extensive medical issues, ESRD on hemodialysis TTS schedule, paroxysmal A. fib on Eliquis, chronic respiratory failure on home oxygen, obesity, coronary artery disease, combined chronic heart failure, type 2 diabetes, hypertension, hyperlipidemia, hypothyroidism, diverticulosis and multiple comorbidities recently underwent tunneled IJ catheter placement on 5/20 due to occlusion of left femoral dialysis catheter.  She has frequent fall.  Also fell on 5/21.  Was complaining of right neck pain after catheter placement.  Continue to complain of back pain and right hip pain so came to ER. In the emergency room febrile and tachycardic on arrival.  Had received dialysis on 5/26 the day of admission.  COVID-19 negative.  Chest x-ray with streaky opacity left lung base.  Head CT negative.  X-ray of the right hip and pelvis without any injuries.  Started on daptomycin and ceftriaxone after blood cultures and admit to the hospital.  Right tunneled IJ catheter was obviously infected on presentation. Blood cultures with MSSA bacteremia.  Assessment & Plan:   Principal Problem:   MSSA bacteremia Active Problems:   Hypothyroidism   Hyperlipidemia   Atrial fibrillation ([place])   ESRD (end stage renal disease) ([place])  Severe sepsis secondary to MSSA bacteremia, infected indwelling tunneled dialysis catheter: 5/26, blood cultures with MSSA 5/27, repeat blood cultures still growing MSSA, was drawn while catheter was a still in.   5/27, removal of right IJ permacath. 5/29, repeat blood cultures drawn, no growth so far.  Currently on cefazolin.  Followed by ID. TTE with no obvious vegetation.  We will request TEE. Patient will need another dialysis access, hopefully if blood cultures from 5/29 negative she can go for permacath.  Will clarify with ID in  the morning.  She is on a schedule for permacath tomorrow.  ESRD on HD: TTS schedule . Received HD 5/26.   Closely monitoring and trying to defer catheter until Tuesday if blood cultures negative from 5/29.  Paroxysmal atrial fibrillation: Patient currently in sinus rhythm.  Eliquis on hold until permacath.  On amiodarone.  No indication for bridging.  Type 2 diabetes on insulin: Remains on sliding scale.  Stable.  Chronic respiratory failure: No change in oxygen requirement.  Fairly stable.  Debility and frailty/frequent falls: Work with PT OT.  Lives at home with family.    DVT prophylaxis: Place and maintain sequential compression device Start: 06/20/20 2134   Code Status: Full code Family Communication: Daughter on the phone called and updated today. Disposition Plan: Status is: Inpatient  Remains inpatient appropriate because:Ongoing diagnostic testing needed not appropriate for outpatient work up and Inpatient level of care appropriate due to severity of illness   Dispo: The patient is from: Home              Anticipated d/c is to: Home with home health.              Patient currently is not medically stable to d/c.   Difficult to place patient No   Consultants:  Vascular surgery Nephrology Infectious disease  Procedures:   None  Antimicrobials:   Daptomycin and Rocephin 5/26--5/27  Cefazolin 5/28---     Subjective: Patient seen and examined.  No new events.  Denies any complaints.wants to shower while she has no perm a  Cath.    Objective: Vitals:   06/23/20 0505 06/23/20 1139 06/23/20 1956 06/24/20  0453  BP: (!) 109/49 (!) 99/54 128/61 114/61  Pulse: 61 62 62 (!) 57  Resp: 19 19 18 13   Temp: 97.9 F (36.6 C) 98 F (36.7 C) 98 F (36.7 C) 97.8 F (36.6 C)  TempSrc: Oral Oral Oral Oral  SpO2: 100% 99% 100% 93%  Weight:      Height:        Intake/Output Summary (Last 24 hours) at 06/24/2020 1252 Last data filed at 06/23/2020 2156 Gross per 24  hour  Intake 60 ml  Output 775 ml  Net -715 ml   Filed Weights   06/20/20 2044 06/21/20 0254  Weight: 99.5 kg 99.1 kg    Examination:  General exam: Appears calm and comfortable. Sitting at the edge of the bed. Currently on room air that she uses at home.  Frail and debilitated.  Not in any distress. Respiratory system: Clear to auscultation. Respiratory effort normal.  Mostly bilateral clear. Cardiovascular system: S1 & S2 heard, RRR.  2+ bilateral pedal edema.  Right leg more than left. Right chest wall incision clean and dry. Gastrointestinal system: Abdomen is nondistended, soft and nontender. No organomegaly or masses felt. Normal bowel sounds heard. Patient has ostomy bag with brown liquidy stool. Central nervous system: Alert and oriented. No focal neurological deficits.  Generalized weakness.    Data Reviewed: I have personally reviewed following labs and imaging studies  CBC: Recent Labs  Lab 06/20/20 1649 06/21/20 0226 06/23/20 0111  WBC 13.1* 13.5* 7.2  NEUTROABS 11.1*  --  4.6  HGB 11.5* 10.1* 9.8*  HCT 37.4 33.3* 31.2*  MCV 100.0 101.5* 98.4  PLT 230 203 539   Basic Metabolic Panel: Recent Labs  Lab 06/20/20 1649 06/21/20 0226 06/22/20 0022 06/23/20 0111 06/24/20 0028  NA 139 138  --  133* 133*  K 3.7 3.9  --  3.9 4.3  CL 99 99  --  98 99  CO2 30 30  --  26 24  GLUCOSE 140* 101*  --  144* 170*  BUN 9 10  --  31* 37*  CREATININE 3.46* 4.08*  --  6.26* 6.99*  CALCIUM 8.0* 8.1*  --  8.7* 8.7*  PHOS  --   --  4.5  --   --    GFR: Estimated Creatinine Clearance: 7.6 mL/min (A) (by C-G formula based on SCr of 6.99 mg/dL (H)). Liver Function Tests: Recent Labs  Lab 06/20/20 1649  AST 30  ALT 17  ALKPHOS 121  BILITOT 1.1  PROT 6.6  ALBUMIN 2.9*   No results for input(s): LIPASE, AMYLASE in the last 168 hours. No results for input(s): AMMONIA in the last 168 hours. Coagulation Profile: Recent Labs  Lab 06/20/20 1649  INR 1.6*   Cardiac  Enzymes: Recent Labs  Lab 06/21/20 0226  CKTOTAL 18*   BNP (last 3 results) No results for input(s): PROBNP in the last 8760 hours. HbA1C: No results for input(s): HGBA1C in the last 72 hours. CBG: Recent Labs  Lab 06/23/20 1608 06/23/20 2016 06/24/20 0459 06/24/20 0744 06/24/20 1241  GLUCAP 177* 148* 136* 156* 201*   Lipid Profile: No results for input(s): CHOL, HDL, LDLCALC, TRIG, CHOLHDL, LDLDIRECT in the last 72 hours. Thyroid Function Tests: No results for input(s): TSH, T4TOTAL, FREET4, T3FREE, THYROIDAB in the last 72 hours. Anemia Panel: No results for input(s): VITAMINB12, FOLATE, FERRITIN, TIBC, IRON, RETICCTPCT in the last 72 hours. Sepsis Labs: Recent Labs  Lab 06/20/20 2209 06/21/20 0226 06/21/20 0714 06/21/20 1024  LATICACIDVEN 3.7* 2.4* 1.8 1.7    Recent Results (from the past 240 hour(s))  Blood culture (routine x 2)     Status: Abnormal   Collection Time: 06/20/20  4:55 PM   Specimen: BLOOD RIGHT HAND  Result Value Ref Range Status   Specimen Description BLOOD RIGHT HAND  Final   Special Requests   Final    BOTTLES DRAWN AEROBIC AND ANAEROBIC Blood Culture results may not be optimal due to an inadequate volume of blood received in culture bottles   Culture  Setup Time   Final    GRAM POSITIVE COCCI IN CLUSTERS IN BOTH AEROBIC AND ANAEROBIC BOTTLES CRITICAL VALUE NOTED.  VALUE IS CONSISTENT WITH PREVIOUSLY REPORTED AND CALLED VALUE.    Culture (A)  Final    STAPHYLOCOCCUS AUREUS SUSCEPTIBILITIES PERFORMED ON PREVIOUS CULTURE WITHIN THE LAST 5 DAYS. Performed at [facility], [place] [address] [place], [place] [address]    Report Status 06/23/2020 FINAL  Final  Resp Panel by RT-PCR (Flu A&B, Covid) Nasopharyngeal Swab     Status: None   Collection Time: 06/20/20  5:51 PM   Specimen: Nasopharyngeal Swab; Nasopharyngeal(NP) swabs in vial transport medium  Result Value Ref Range Status   SARS Coronavirus 2 by RT PCR NEGATIVE NEGATIVE Final     Comment: (NOTE) SARS-CoV-2 target nucleic acids are NOT DETECTED.  The SARS-CoV-2 RNA is generally detectable in upper respiratory specimens during the acute phase of infection. The lowest concentration of SARS-CoV-2 viral copies this assay can detect is 138 copies/mL. A negative result does not preclude SARS-Cov-2 infection and should not be used as the sole basis for treatment or other patient management decisions. A negative result may occur with  improper specimen collection/handling, submission of specimen other than nasopharyngeal swab, presence of viral mutation(s) within the areas targeted by this assay, and inadequate number of viral copies(<138 copies/mL). A negative result must be combined with clinical observations, patient history, and epidemiological information. The expected result is Negative.  Fact Sheet for Patients:  EntrepreneurPulse.com.au  Fact Sheet for Healthcare Providers:  IncredibleEmployment.be  This test is no t yet approved or cleared by the [place] FDA and  has been authorized for detection and/or diagnosis of SARS-CoV-2 by FDA under an Emergency Use Authorization (EUA). This EUA will remain  in effect (meaning this test can be used) for the duration of the COVID-19 declaration under Section 564(b)(1) of the Act, 21 U.S.C.section 360bbb-3(b)(1), unless the authorization is terminated  or revoked sooner.       Influenza A by PCR NEGATIVE NEGATIVE Final   Influenza B by PCR NEGATIVE NEGATIVE Final    Comment: (NOTE) The Xpert Xpress SARS-CoV-2/FLU/RSV plus assay is intended as an aid in the diagnosis of influenza from Nasopharyngeal swab specimens and should not be used as a sole basis for treatment. Nasal washings and aspirates are unacceptable for Xpert Xpress SARS-CoV-2/FLU/RSV testing.  Fact Sheet for Patients: EntrepreneurPulse.com.au  Fact Sheet for Healthcare  Providers: IncredibleEmployment.be  This test is not yet approved or cleared by the [place] FDA and has been authorized for detection and/or diagnosis of SARS-CoV-2 by FDA under an Emergency Use Authorization (EUA). This EUA will remain in effect (meaning this test can be used) for the duration of the COVID-19 declaration under Section 564(b)(1) of the Act, 21 U.S.C. section 360bbb-3(b)(1), unless the authorization is terminated or revoked.  Performed at [facility], [place] [address], [place], [place] [address]   Blood culture (routine  x 2)     Status: Abnormal   Collection Time: 06/20/20  6:25 PM   Specimen: Right Antecubital; Blood  Result Value Ref Range Status   Specimen Description RIGHT ANTECUBITAL  Final   Special Requests   Final    BOTTLES DRAWN AEROBIC ONLY Blood Culture adequate volume   Culture  Setup Time   Final    GRAM POSITIVE COCCI IN CLUSTERS AEROBIC BOTTLE ONLY CRITICAL RESULT CALLED TO, READ BACK BY AND VERIFIED WITH: PHARMD M.YATES AT 9767 ON 06/21/2020 BY T.SAAD. Performed at [facility], [place] [address] [place], [place] [address]    Culture STAPHYLOCOCCUS AUREUS (A)  Final   Report Status 06/23/2020 FINAL  Final   Organism ID, Bacteria STAPHYLOCOCCUS AUREUS  Final      Susceptibility   Staphylococcus aureus - MIC*    CIPROFLOXACIN <=0.5 SENSITIVE Sensitive     ERYTHROMYCIN RESISTANT Resistant     GENTAMICIN <=0.5 SENSITIVE Sensitive     OXACILLIN 0.5 SENSITIVE Sensitive     TETRACYCLINE <=1 SENSITIVE Sensitive     VANCOMYCIN <=0.5 SENSITIVE Sensitive     TRIMETH/SULFA <=10 SENSITIVE Sensitive     CLINDAMYCIN RESISTANT Resistant     RIFAMPIN <=0.5 SENSITIVE Sensitive     Inducible Clindamycin POSITIVE Resistant     * STAPHYLOCOCCUS AUREUS  Blood Culture ID Panel (Reflexed)     Status: Abnormal   Collection Time: 06/20/20  6:25 PM  Result Value Ref Range Status   Enterococcus faecalis NOT DETECTED NOT DETECTED  Final   Enterococcus Faecium NOT DETECTED NOT DETECTED Final   Listeria monocytogenes NOT DETECTED NOT DETECTED Final   Staphylococcus species DETECTED (A) NOT DETECTED Final   Staphylococcus aureus (BCID) DETECTED (A) NOT DETECTED Final    Comment: CRITICAL RESULT CALLED TO, READ BACK BY AND VERIFIED WITH: M. PHAM PHARMD AT 1915 06/21/20 BY D. VANHOOK    Staphylococcus epidermidis NOT DETECTED NOT DETECTED Final   Staphylococcus lugdunensis NOT DETECTED NOT DETECTED Final   Streptococcus species NOT DETECTED NOT DETECTED Final   Streptococcus agalactiae NOT DETECTED NOT DETECTED Final   Streptococcus pneumoniae NOT DETECTED NOT DETECTED Final   Streptococcus pyogenes NOT DETECTED NOT DETECTED Final   A.calcoaceticus-baumannii NOT DETECTED NOT DETECTED Final   Bacteroides fragilis NOT DETECTED NOT DETECTED Final   Enterobacterales NOT DETECTED NOT DETECTED Final   Enterobacter cloacae complex NOT DETECTED NOT DETECTED Final   Escherichia coli NOT DETECTED NOT DETECTED Final   Klebsiella aerogenes NOT DETECTED NOT DETECTED Final   Klebsiella oxytoca NOT DETECTED NOT DETECTED Final   Klebsiella pneumoniae NOT DETECTED NOT DETECTED Final   Proteus species NOT DETECTED NOT DETECTED Final   Salmonella species NOT DETECTED NOT DETECTED Final   Serratia marcescens NOT DETECTED NOT DETECTED Final   Haemophilus influenzae NOT DETECTED NOT DETECTED Final   Neisseria meningitidis NOT DETECTED NOT DETECTED Final   Pseudomonas aeruginosa NOT DETECTED NOT DETECTED Final   Stenotrophomonas maltophilia NOT DETECTED NOT DETECTED Final   Candida albicans NOT DETECTED NOT DETECTED Final   Candida auris NOT DETECTED NOT DETECTED Final   Candida glabrata NOT DETECTED NOT DETECTED Final   Candida krusei NOT DETECTED NOT DETECTED Final   Candida parapsilosis NOT DETECTED NOT DETECTED Final   Candida tropicalis NOT DETECTED NOT DETECTED Final   Cryptococcus neoformans/gattii NOT DETECTED NOT DETECTED Final    Meth resistant mecA/C and MREJ NOT DETECTED NOT DETECTED Final    Comment: Performed at [facility], [address] [place], [place] [address]  Culture, blood (routine x 2)     Status: Abnormal   Collection Time: 06/21/20  3:01 PM   Specimen: BLOOD  Result Value Ref Range Status   Specimen Description BLOOD LEFT ANTECUBITAL  Final   Special Requests   Final    BOTTLES DRAWN AEROBIC ONLY Blood Culture results may not be optimal due to an inadequate volume of blood received in culture bottles   Culture  Setup Time   Final    GRAM POSITIVE COCCI IN CLUSTERS AEROBIC BOTTLE ONLY CRITICAL VALUE NOTED.  VALUE IS CONSISTENT WITH PREVIOUSLY REPORTED AND CALLED VALUE.    Culture (A)  Final    STAPHYLOCOCCUS AUREUS SUSCEPTIBILITIES PERFORMED ON PREVIOUS CULTURE WITHIN THE LAST 5 DAYS. Performed at [facility], [place] [address] [place], [place] [address]    Report Status 06/24/2020 FINAL  Final  Culture, blood (routine x 2)     Status: Abnormal   Collection Time: 06/21/20  3:10 PM   Specimen: BLOOD RIGHT HAND  Result Value Ref Range Status   Specimen Description BLOOD RIGHT HAND  Final   Special Requests   Final    BOTTLES DRAWN AEROBIC ONLY Blood Culture results may not be optimal due to an inadequate volume of blood received in culture bottles   Culture  Setup Time   Final    GRAM POSITIVE COCCI IN CLUSTERS AEROBIC BOTTLE ONLY CRITICAL VALUE NOTED.  VALUE IS CONSISTENT WITH PREVIOUSLY REPORTED AND CALLED VALUE.    Culture (A)  Final    STAPHYLOCOCCUS AUREUS SUSCEPTIBILITIES PERFORMED ON PREVIOUS CULTURE WITHIN THE LAST 5 DAYS. Performed at [facility], [place] [address] [place], [place] [address]    Report Status 06/24/2020 FINAL  Final  Culture, blood (Routine X 2) w Reflex to ID Panel     Status: None (Preliminary result)   Collection Time: 06/23/20 12:57 PM   Specimen: BLOOD RIGHT ARM  Result Value Ref Range Status   Specimen Description BLOOD RIGHT ARM  Final    Special Requests   Final    BOTTLES DRAWN AEROBIC ONLY Blood Culture results may not be optimal due to an inadequate volume of blood received in culture bottles   Culture   Final    NO GROWTH < 24 HOURS Performed at [facility], [place] [address] [place], [place] [address]    Report Status PENDING  Incomplete  Culture, blood (Routine X 2) w Reflex to ID Panel     Status: None (Preliminary result)   Collection Time: 06/23/20  1:05 PM   Specimen: BLOOD RIGHT ARM  Result Value Ref Range Status   Specimen Description BLOOD RIGHT ARM  Final   Special Requests   Final    BOTTLES DRAWN AEROBIC ONLY Blood Culture results may not be optimal due to an inadequate volume of blood received in culture bottles   Culture   Final    NO GROWTH < 24 HOURS Performed at [facility], [place] [address] [place],  [address]    Report Status PENDING  Incomplete         Radiology Studies: ECHOCARDIOGRAM COMPLETE  Result Date: 06/23/2020    ECHOCARDIOGRAM REPORT   Patient Name:   ALPA SALVO Date of Exam: 06/23/2020 Medical Rec #:  [id number]        Height:       63.0 in Accession #:    [id number]  Weight:       218.5 lb Date of Birth:  1940/10/04        BSA:          2.008 m Patient Age:    50 years         BP:           99/54 mmHg Patient Gender: F                HR:           56 bpm. Exam Location:  Inpatient Procedure: 2D Echo, Cardiac Doppler and Color Doppler Indications:    Bacteremia  History:        Patient has prior history of Echocardiogram examinations, most                 recent 11/22/2016. CHF, Previous Myocardial Infarction and CAD;                 Risk Factors:Dyslipidemia and Diabetes. ESRD. Hx atrial                 fibrillation. ESRD. CKD. Patient states she has an old loop                 recorder in place.  Sonographer:    Clayton Lefort RDCS (AE) Referring Phys: [id number] [facility]  Sonographer Comments: No IV access for Definity use at time of echocardiogram.  IMPRESSIONS  1. Left ventricular ejection fraction, by estimation, is 50 to 55%. The left ventricle has low normal function. The left ventricle has no regional wall motion abnormalities. Left ventricular diastolic parameters are consistent with Grade III diastolic dysfunction (restrictive). Elevated left ventricular end-diastolic pressure.  2. Right ventricular systolic function is normal. The right ventricular size is moderately enlarged. Tricuspid regurgitation signal is inadequate for assessing PA pressure.  3. Left atrial size was moderately dilated.  4. Right atrial size was moderately dilated.  5. The mitral valve is abnormal. Trivial mitral valve regurgitation. No evidence of mitral stenosis. Moderate mitral annular calcification.  6. The aortic valve is tricuspid. There is moderate calcification of the aortic valve. There is moderate thickening of the aortic valve. Aortic valve regurgitation is not visualized. Mild aortic valve sclerosis is present, with no evidence of aortic valve stenosis.  7. The inferior vena cava is normal in size with greater than 50% respiratory variability, suggesting right atrial pressure of 3 mmHg. Comparison(s): No significant change from prior study. Conclusion(s)/Recommendation(s): No evidence of valvular vegetations on this transthoracic echocardiogram. Would recommend a transesophageal echocardiogram to exclude infective endocarditis if clinically indicated. FINDINGS  Left Ventricle: Left ventricular ejection fraction, by estimation, is 50 to 55%. The left ventricle has low normal function. The left ventricle has no regional wall motion abnormalities. The left ventricular internal cavity size was normal in size. There is no left ventricular hypertrophy. Left ventricular diastolic parameters are consistent with Grade III diastolic dysfunction (restrictive). Elevated left ventricular end-diastolic pressure. Right Ventricle: The right ventricular size is moderately enlarged.  Right vetricular wall thickness was not well visualized. Right ventricular systolic function is normal. Tricuspid regurgitation signal is inadequate for assessing PA pressure. Left Atrium: Left atrial size was moderately dilated. Right Atrium: Right atrial size was moderately dilated. Pericardium: There is no evidence of pericardial effusion. Presence of pericardial fat pad. Mitral Valve: The mitral valve is abnormal. There is moderate thickening of the mitral valve leaflet(s). There is moderate calcification of the mitral valve leaflet(s). Moderate mitral annular calcification.  Trivial mitral valve regurgitation. No evidence of mitral valve stenosis. MV peak gradient, 3.4 mmHg. The mean mitral valve gradient is 1.0 mmHg. Tricuspid Valve: The tricuspid valve is normal in structure. Tricuspid valve regurgitation is trivial. Aortic Valve: The aortic valve is tricuspid. There is moderate calcification of the aortic valve. There is moderate thickening of the aortic valve. Aortic valve regurgitation is not visualized. Mild aortic valve sclerosis is present, with no evidence of aortic valve stenosis. Aortic valve mean gradient measures 7.0 mmHg. Aortic valve peak gradient measures 13.8 mmHg. Aortic valve area, by VTI measures 1.39 cm. Pulmonic Valve: The pulmonic valve was not well visualized. Pulmonic valve regurgitation is not visualized. Aorta: The aortic root was not well visualized and the aortic root, ascending aorta and aortic arch are all structurally normal, with no evidence of dilitation or obstruction. Venous: The inferior vena cava is normal in size with greater than 50% respiratory variability, suggesting right atrial pressure of 3 mmHg. IAS/Shunts: The atrial septum is grossly normal.  LEFT VENTRICLE PLAX 2D LVIDd:         5.60 cm  Diastology LVIDs:         3.90 cm  LV e' medial:    5.23 cm/s LV PW:         1.10 cm  LV E/e' medial:  19.3 LV IVS:        0.90 cm  LV e' lateral:   6.58 cm/s LVOT diam:     2.00  cm  LV E/e' lateral: 15.3 LV SV:         61 LV SV Index:   30 LVOT Area:     3.14 cm  RIGHT VENTRICLE            IVC RV Basal diam:  4.10 cm    IVC diam: 1.60 cm RV Mid diam:    3.20 cm RV S prime:     9.51 cm/s TAPSE (M-mode): 1.9 cm LEFT ATRIUM             Index       RIGHT ATRIUM           Index LA diam:        4.60 cm 2.29 cm/m  RA Area:     20.70 cm LA Vol (A2C):   91.6 ml 45.63 ml/m RA Volume:   61.90 ml  30.83 ml/m LA Vol (A4C):   78.0 ml 38.85 ml/m LA Biplane Vol: 85.0 ml 42.34 ml/m  AORTIC VALVE AV Area (Vmax):    1.50 cm AV Area (Vmean):   1.44 cm AV Area (VTI):     1.39 cm AV Vmax:           186.00 cm/s AV Vmean:          122.000 cm/s AV VTI:            0.435 m AV Peak Grad:      13.8 mmHg AV Mean Grad:      7.0 mmHg LVOT Vmax:         88.70 cm/s LVOT Vmean:        56.100 cm/s LVOT VTI:          0.193 m LVOT/AV VTI ratio: 0.44  AORTA Ao Root diam: 3.40 cm Ao Asc diam:  3.50 cm MITRAL VALVE MV Area (PHT): 4.31 cm     SHUNTS MV Area VTI:   1.92 cm     Systemic VTI:  0.19 m MV Peak grad:  3.4 mmHg     Systemic Diam: 2.00 cm MV Mean grad:  1.0 mmHg MV Vmax:       0.92 m/s MV Vmean:      47.6 cm/s MV Decel Time: 176 msec MV E velocity: 101.00 cm/s MV A velocity: 36.50 cm/s MV E/A ratio:  2.77 Buford Dresser MD Electronically signed by Buford Dresser MD Signature Date/Time: 06/23/2020/5:59:13 PM    Final         Scheduled Meds: . amiodarone  200 mg Oral Daily  . Chlorhexidine Gluconate Cloth  6 each Topical Daily  . Chlorhexidine Gluconate Cloth  6 each Topical Q0600  . clonazePAM  1 mg Oral Daily  . estradiol  1 mg Oral Daily  . famotidine  20 mg Oral Q2000  . insulin aspart  0-5 Units Subcutaneous QHS  . insulin aspart  0-6 Units Subcutaneous TID WC  . levothyroxine  175 mcg Oral QAC breakfast  . simvastatin  10 mg Oral QHS   Continuous Infusions: .  ceFAZolin (ANCEF) IV 1 g (06/23/20 1822)     LOS: 4 days    Time spent: 30 minutes    Barb Merino,  MD [facility] Pager [phone]

## 2020-06-25 ENCOUNTER — Encounter (HOSPITAL_COMMUNITY)
Admission: EM | Disposition: A | Discharge status: Home Health | Payer: Self-pay | Source: Home / Self Care | Attending: Internal Medicine

## 2020-06-25 ENCOUNTER — Inpatient Hospital Stay (HOSPITAL_COMMUNITY): Payer: Medicare HMO | Admitting: Anesthesiology

## 2020-06-25 ENCOUNTER — Inpatient Hospital Stay (HOSPITAL_COMMUNITY): Payer: Medicare HMO

## 2020-06-25 ENCOUNTER — Encounter (HOSPITAL_COMMUNITY): Payer: Self-pay | Admitting: Internal Medicine

## 2020-06-25 DIAGNOSIS — B9561 Methicillin susceptible Staphylococcus aureus infection as the cause of diseases classified elsewhere: Secondary | ICD-10-CM | POA: Diagnosis not present

## 2020-06-25 DIAGNOSIS — R7881 Bacteremia: Secondary | ICD-10-CM | POA: Diagnosis not present

## 2020-06-25 DIAGNOSIS — N186 End stage renal disease: Secondary | ICD-10-CM

## 2020-06-25 HISTORY — PX: INSERTION OF DIALYSIS CATHETER: SHX1324

## 2020-06-25 LAB — GLUCOSE, CAPILLARY
Glucose-Capillary: 134 mg/dL — ABNORMAL HIGH (ref 70–99)
Glucose-Capillary: 135 mg/dL — ABNORMAL HIGH (ref 70–99)
Glucose-Capillary: 162 mg/dL — ABNORMAL HIGH (ref 70–99)
Glucose-Capillary: 131 mg/dL — ABNORMAL HIGH (ref 70–99)
Glucose-Capillary: 176 mg/dL — ABNORMAL HIGH (ref 70–99)

## 2020-06-25 LAB — BASIC METABOLIC PANEL
Anion gap: 12 (ref 5–15)
BUN: 45 mg/dL — ABNORMAL HIGH (ref 8–23)
CO2: 23 mmol/L (ref 22–32)
Calcium: 8.7 mg/dL — ABNORMAL LOW (ref 8.9–10.3)
Chloride: 97 mmol/L — ABNORMAL LOW (ref 98–111)
Creatinine, Ser: 7.26 mg/dL — ABNORMAL HIGH (ref 0.44–1.00)
GFR, Estimated: 5 mL/min — ABNORMAL LOW (ref >60)
Glucose, Bld: 151 mg/dL — ABNORMAL HIGH (ref 70–99)
Potassium: 4.1 mmol/L (ref 3.5–5.1)
Sodium: 132 mmol/L — ABNORMAL LOW (ref 135–145)

## 2020-06-25 SURGERY — INSERTION OF DIALYSIS CATHETER
Anesthesia: Monitor Anesthesia Care | Laterality: Left

## 2020-06-25 MED ORDER — LIDOCAINE-EPINEPHRINE (PF) 1 %-1:200000 IJ SOLN
INTRAMUSCULAR | Status: AC
  Filled 2020-06-25: qty 30

## 2020-06-25 MED ORDER — FENTANYL CITRATE (PF) 250 MCG/5ML IJ SOLN
INTRAMUSCULAR | Status: DC | PRN
  Administered 2020-06-25: 25 ug via INTRAVENOUS

## 2020-06-25 MED ORDER — FENTANYL CITRATE (PF) 100 MCG/2ML IJ SOLN: 25.0000 ug | INTRAMUSCULAR | Status: DC | PRN

## 2020-06-25 MED ORDER — SODIUM CHLORIDE 0.9 % IV SOLN
INTRAVENOUS | Status: AC
  Filled 2020-06-25: qty 1.2

## 2020-06-25 MED ORDER — CHLORHEXIDINE GLUCONATE 0.12 % MT SOLN: 15.0000 mL | Freq: Once | OROMUCOSAL | Status: AC

## 2020-06-25 MED ORDER — HEPARIN SODIUM (PORCINE) 1000 UNIT/ML IJ SOLN
INTRAMUSCULAR | Status: AC
  Filled 2020-06-25: qty 1

## 2020-06-25 MED ORDER — SODIUM CHLORIDE 0.9 % IV SOLN: INTRAVENOUS | Status: DC | PRN

## 2020-06-25 MED ORDER — CHLORHEXIDINE GLUCONATE 0.12 % MT SOLN
OROMUCOSAL | Status: AC
  Administered 2020-06-25: 15 mL via OROMUCOSAL
  Filled 2020-06-25: qty 15

## 2020-06-25 MED ORDER — 0.9 % SODIUM CHLORIDE (POUR BTL) OPTIME
TOPICAL | Status: DC | PRN
  Administered 2020-06-25: 1000 mL

## 2020-06-25 MED ORDER — HEPARIN SODIUM (PORCINE) 1000 UNIT/ML IJ SOLN
INTRAMUSCULAR | Status: DC | PRN
  Administered 2020-06-25: 4200 [IU]

## 2020-06-25 MED ORDER — LACTATED RINGERS IV SOLN: INTRAVENOUS | Status: DC

## 2020-06-25 MED ORDER — LIDOCAINE 2% (20 MG/ML) 5 ML SYRINGE
INTRAMUSCULAR | Status: DC | PRN
  Administered 2020-06-25: 40 mg via INTRAVENOUS

## 2020-06-25 MED ORDER — LIDOCAINE-EPINEPHRINE (PF) 1 %-1:200000 IJ SOLN
INTRAMUSCULAR | Status: DC | PRN
  Administered 2020-06-25: 20 mL

## 2020-06-25 MED ORDER — FENTANYL CITRATE (PF) 250 MCG/5ML IJ SOLN
INTRAMUSCULAR | Status: AC
  Filled 2020-06-25: qty 5

## 2020-06-25 MED ORDER — ACETAMINOPHEN 500 MG PO TABS
1000.0000 mg | ORAL_TABLET | Freq: Once | ORAL | Status: AC
  Administered 2020-06-25: 1000 mg via ORAL
  Filled 2020-06-25: qty 2

## 2020-06-25 MED ORDER — LIDOCAINE 2% (20 MG/ML) 5 ML SYRINGE
INTRAMUSCULAR | Status: AC
  Filled 2020-06-25: qty 5

## 2020-06-25 MED ORDER — PHENYLEPHRINE 40 MCG/ML (10ML) SYRINGE FOR IV PUSH (FOR BLOOD PRESSURE SUPPORT)
PREFILLED_SYRINGE | INTRAVENOUS | Status: DC | PRN
  Administered 2020-06-25 (×3): 80 ug via INTRAVENOUS

## 2020-06-25 MED ORDER — PROPOFOL 500 MG/50ML IV EMUL
INTRAVENOUS | Status: DC | PRN
  Administered 2020-06-25: 50 ug/kg/min via INTRAVENOUS

## 2020-06-25 MED ORDER — SODIUM CHLORIDE 0.9 % IV SOLN
INTRAVENOUS | Status: DC | PRN
  Administered 2020-06-25: 500 mL

## 2020-06-25 MED ORDER — ORAL CARE MOUTH RINSE: 15.0000 mL | Freq: Once | OROMUCOSAL | Status: AC

## 2020-06-25 SURGICAL SUPPLY — 47 items
ADH SKN CLS APL DERMABOND .7 (GAUZE/BANDAGES/DRESSINGS) ×1
APL PRP STRL LF DISP 70% ISPRP (MISCELLANEOUS) ×1
BAG DECANTER FOR FLEXI CONT (MISCELLANEOUS) ×2 IMPLANT
BIOPATCH RED 1 DISK 7.0 (GAUZE/BANDAGES/DRESSINGS) ×2 IMPLANT
CATH PALINDROME-P 19CM W/VT (CATHETERS) IMPLANT
CATH PALINDROME-P 23CM W/VT (CATHETERS) IMPLANT
CATH PALINDROME-P 28CM W/VT (CATHETERS) ×1 IMPLANT
CHLORAPREP W/TINT 26 (MISCELLANEOUS) ×2 IMPLANT
COVER PROBE W GEL 5X96 (DRAPES) IMPLANT
COVER SURGICAL LIGHT HANDLE (MISCELLANEOUS) ×2 IMPLANT
COVER WAND RF STERILE (DRAPES) ×2 IMPLANT
DECANTER SPIKE VIAL GLASS SM (MISCELLANEOUS) ×1 IMPLANT
DERMABOND ADVANCED (GAUZE/BANDAGES/DRESSINGS) ×1
DERMABOND ADVANCED .7 DNX12 (GAUZE/BANDAGES/DRESSINGS) IMPLANT
DRAPE C-ARM 42X72 X-RAY (DRAPES) ×2 IMPLANT
DRAPE CHEST BREAST 15X10 FENES (DRAPES) ×2 IMPLANT
GAUZE 4X4 16PLY RFD (DISPOSABLE) ×2 IMPLANT
GLOVE BIO SURGEON STRL SZ7.5 (GLOVE) ×2 IMPLANT
GLOVE SRG 8 PF TXTR STRL LF DI (GLOVE) ×1 IMPLANT
GLOVE SURG SYN 7.5  E (GLOVE) ×2
GLOVE SURG SYN 7.5 E (GLOVE) ×1 IMPLANT
GLOVE SURG SYN 7.5 PF PI (GLOVE) IMPLANT
GLOVE SURG UNDER POLY LF SZ6.5 (GLOVE) ×1 IMPLANT
GLOVE SURG UNDER POLY LF SZ7 (GLOVE) ×1 IMPLANT
GLOVE SURG UNDER POLY LF SZ8 (GLOVE) ×2
GOWN STRL REUS W/ TWL LRG LVL3 (GOWN DISPOSABLE) ×2 IMPLANT
GOWN STRL REUS W/TWL LRG LVL3 (GOWN DISPOSABLE) ×4
KIT BASIN OR (CUSTOM PROCEDURE TRAY) ×2 IMPLANT
KIT MICROPUNCTURE NIT STIFF (SHEATH) ×2 IMPLANT
KIT PALINDROME-P 55CM (CATHETERS) IMPLANT
KIT TURNOVER KIT B (KITS) ×2 IMPLANT
NDL 18GX1X1/2 (RX/OR ONLY) (NEEDLE) ×1 IMPLANT
NDL HYPO 25GX1X1/2 BEV (NEEDLE) ×1 IMPLANT
NEEDLE 18GX1X1/2 (RX/OR ONLY) (NEEDLE) ×2 IMPLANT
NEEDLE HYPO 25GX1X1/2 BEV (NEEDLE) ×2 IMPLANT
NS IRRIG 1000ML POUR BTL (IV SOLUTION) ×2 IMPLANT
PACK SURGICAL SETUP 50X90 (CUSTOM PROCEDURE TRAY) ×2 IMPLANT
PAD ARMBOARD 7.5X6 YLW CONV (MISCELLANEOUS) ×4 IMPLANT
SUT ETHILON 3 0 PS 1 (SUTURE) ×2 IMPLANT
SUT MNCRL AB 4-0 PS2 18 (SUTURE) ×2 IMPLANT
SYR 10ML LL (SYRINGE) ×2 IMPLANT
SYR 20ML LL LF (SYRINGE) ×4 IMPLANT
SYR 5ML LL (SYRINGE) ×4 IMPLANT
SYR CONTROL 10ML LL (SYRINGE) ×2 IMPLANT
TOWEL GREEN STERILE (TOWEL DISPOSABLE) ×4 IMPLANT
TOWEL GREEN STERILE FF (TOWEL DISPOSABLE) ×2 IMPLANT
WATER STERILE IRR 1000ML POUR (IV SOLUTION) ×2 IMPLANT

## 2020-06-25 NOTE — Interval H&P Note (Signed)
History and Physical Interval Note:  06/25/2020 8:42 AM  [name]  has presented today for surgery, with the diagnosis of ESRD.  The various methods of treatment have been discussed with the patient and family. After consideration of risks, benefits and other options for treatment, the patient has consented to  Procedure(s): INSERTION OF TUNNELED  DIALYSIS CATHETER (N/A) as a surgical intervention.  The patient's history has been reviewed, patient examined, no change in status, stable for surgery.  I have reviewed the patient's chart and labs.  Questions were answered to the patient's satisfaction.     Deitra Mayo

## 2020-06-25 NOTE — Plan of Care (Signed)
  Problem: Respiratory: Goal: Ability to maintain adequate ventilation will improve Outcome: Progressing   Problem: Education: Goal: Knowledge of General Education information will improve Description: Including pain rating scale, medication(s)/side effects and non-pharmacologic comfort measures Outcome: Progressing   Problem: Coping: Goal: Level of anxiety will decrease Outcome: Progressing

## 2020-06-25 NOTE — Progress Notes (Signed)
PT Cancellation Note  Patient Details Name: [name] MRN: [id number] DOB: 08-23-1940   Cancelled Treatment:    Reason Eval/Treat Not Completed: Patient declined, no reason specified politely declines PT due to having just had procedure this morning and HD soon. Reports she was able to walk to the bathroom earlier (RN confirms). Agreeable to trying PT tomorrow. Will continue to follow.    Windell Norfolk, DPT, PN1   Supplemental Physical Therapist [facility]    Pager [phone] Acute Rehab Office [phone]

## 2020-06-25 NOTE — Anesthesia Procedure Notes (Signed)
Procedure Name: MAC Date/Time: 06/25/2020 10:57 AM Performed by: Colin Benton, CRNA Pre-anesthesia Checklist: Patient identified, Emergency Drugs available, Suction available and Patient being monitored Patient Re-evaluated:Patient Re-evaluated prior to induction Oxygen Delivery Method: Simple face mask Induction Type: IV induction Placement Confirmation: positive ETCO2 Dental Injury: Teeth and Oropharynx as per pre-operative assessment

## 2020-06-25 NOTE — Transfer of Care (Signed)
Immediate Anesthesia Transfer of Care Note  Patient: [name]  Procedure(s) Performed: INSERTION OF LEFT INTERNAL JUGULAR TUNNELED  DIALYSIS CATHETER (Left )  Patient Location: PACU  Anesthesia Type:MAC  Level of Consciousness: awake, alert , oriented and patient cooperative  Airway & Oxygen Therapy: Patient Spontanous Breathing  Post-op Assessment: Report given to RN and Post -op Vital signs reviewed and stable  Post vital signs: Reviewed and stable  Last Vitals:  Vitals Value Taken Time  BP 91/43 06/25/20 1155  Temp    Pulse 56 06/25/20 1159  Resp 19 06/25/20 1159  SpO2 100 % 06/25/20 1159  Vitals shown include unvalidated device data.  Last Pain:  Vitals:   06/25/20 1008  TempSrc: Oral  PainSc:       Patients Stated Pain Goal: 0 (15/94/70 7615)  Complications: No complications documented.

## 2020-06-25 NOTE — Op Note (Signed)
    NAME: [name]    MRN: [id number] DOB: 1940/09/24    DATE OF OPERATION: 06/25/2020  PREOP DIAGNOSIS:    End-stage renal disease  POSTOP DIAGNOSIS:    Same  PROCEDURE:    Ultrasound-guided placement of left IJ tunneled dialysis catheter (28 cm)  SURGEON: Judeth Cornfield. Scot Dock, MD  ASSIST: None  ANESTHESIA: Local with sedation  EBL: Minimal  INDICATIONS:    [name] is a 80 y.o. female who presents for placement of a new catheter.  She had a right IJ tunneled dialysis catheter removed because of infection  FINDINGS:   Patent left IJ  TECHNIQUE:   The patient was taken to the operating room and sedated by anesthesia.  The neck and upper chest were prepped and draped in usual sterile fashion.  Under ultrasound guidance, after the skin was anesthetized, I cannulated the left IJ with a micropuncture needle and a micropuncture sheath was introduced over a wire.  I then advanced the J-wire into the right atrium.  The exit site for the catheter was selected and the skin anesthetized between the 2 areas.  The catheter was then brought through the tunnel.  Next the tract over the wire was dilated and then the dilator and peel-away sheath were advanced over the wire.  The wire and dilator were removed.  The catheter was passed through the peel-away sheath and positioned in the right atrium.  Both ports withdrew easily were then flushed with heparinized saline and filled with concentrated heparin.  The catheter was secured at its exit site with a 3-0 nylon suture.  The IJ cannulation site was closed with a 4-0 subcuticular stitch.  Dermabond was applied.  The patient tolerated the procedure well transferred to recovery room in stable condition.  All needle and sponge counts were correct.    Deitra Mayo, MD, FACS [facility]  DATE OF DICTATION:   06/25/2020

## 2020-06-25 NOTE — Progress Notes (Signed)
[facility] Progress Note   Subjective:   S/p insertion of new TDC today. Planned for HD this afternoon. Feeling well, no SOB, CP, dizziness, or nausea.   Objective Vitals:   06/25/20 1008 06/25/20 1155 06/25/20 1210 06/25/20 1235  BP: (!) 140/50 (!) 91/43 (!) 96/46 (!) 92/46  Pulse: 60 (!) 56 (!) 56   Resp: 17 17 18    Temp: 98.4 F (36.9 C) (!) 97.1 F (36.2 C)  97.6 F (36.4 C)  TempSrc: Oral     SpO2: 98% 100% 100% 100%  Weight: 100.2 kg     Height: 5\' 3"  (1.6 m)      Physical Exam  General: Well developed female, alert and in NAD Heart: RRR, no murmurs, rubs or gallops Lungs: CTA bilaterally without wheezing, rhonchi or rales Abdomen: Soft, non-distended, +BS Extremities: 1+ edema bilateral lower extremities Dialysis Access: New L IJ [facility]  Additional Objective Labs: Basic Metabolic Panel: Recent Labs  Lab 06/22/20 0022 06/23/20 0111 06/24/20 0028 06/25/20 0027  NA  --  133* 133* 132*  K  --  3.9 4.3 4.1  CL  --  98 99 97*  CO2  --  26 24 23   GLUCOSE  --  144* 170* 151*  BUN  --  31* 37* 45*  CREATININE  --  6.26* 6.99* 7.26*  CALCIUM  --  8.7* 8.7* 8.7*  PHOS 4.5  --   --   --    Liver Function Tests: Recent Labs  Lab 06/20/20 1649  AST 30  ALT 17  ALKPHOS 121  BILITOT 1.1  PROT 6.6  ALBUMIN 2.9*   No results for input(s): LIPASE, AMYLASE in the last 168 hours. CBC: Recent Labs  Lab 06/20/20 1649 06/21/20 0226 06/23/20 0111  WBC 13.1* 13.5* 7.2  NEUTROABS 11.1*  --  4.6  HGB 11.5* 10.1* 9.8*  HCT 37.4 33.3* 31.2*  MCV 100.0 101.5* 98.4  PLT 230 203 226   Blood Culture    Component Value Date/Time   SDES BLOOD RIGHT ARM 06/23/2020 1305   SPECREQUEST  06/23/2020 1305    BOTTLES DRAWN AEROBIC ONLY Blood Culture results may not be optimal due to an inadequate volume of blood received in culture bottles   CULT  06/23/2020 1305    NO GROWTH 2 DAYS Performed at [facility] [address] [place], [place]  [address]    REPTSTATUS PENDING 06/23/2020 1305    Cardiac Enzymes: Recent Labs  Lab 06/21/20 0226  CKTOTAL 18*   CBG: Recent Labs  Lab 06/24/20 1724 06/24/20 2032 06/25/20 0751 06/25/20 0922 06/25/20 1154  GLUCAP 178* 169* 134* 135* 162*    Studies/Results: DG Chest Port 1 View  Result Date: 06/25/2020 CLINICAL DATA:  Dialysis catheter placement EXAM: PORTABLE CHEST 1 VIEW COMPARISON:  06/20/2020 FINDINGS: Right catheter has been removed. New left IJ approach dialysis catheter tip overlies the superior right atrium. No pneumothorax. Left mid lung atelectasis/scarring. Similar cardiomegaly. IMPRESSION: New left IJ approach dialysis catheter tip overlies superior right atrium. No pneumothorax. Electronically Signed   By: Macy Mis M.D.   On: 06/25/2020 12:30   DG Fluoro Guide CV Line-No Report  Result Date: 06/25/2020 Fluoroscopy was utilized by the requesting physician.  No radiographic interpretation.   ECHOCARDIOGRAM COMPLETE  Result Date: 06/23/2020    ECHOCARDIOGRAM REPORT   Patient Name:   [name] Date of Exam: 06/23/2020 Medical Rec #:  [id number]        Height:  63.0 in Accession #:    [id number]       Weight:       218.5 lb Date of Birth:  1940-10-01        BSA:          2.008 m Patient Age:    80 years         BP:           99/54 mmHg Patient Gender: F                HR:           56 bpm. Exam Location:  Inpatient Procedure: 2D Echo, Cardiac Doppler and Color Doppler Indications:    Bacteremia  History:        Patient has prior history of Echocardiogram examinations, most                 recent 11/22/2016. CHF, Previous Myocardial Infarction and CAD;                 Risk Factors:Dyslipidemia and Diabetes. ESRD. Hx atrial                 fibrillation. ESRD. CKD. Patient states she has an old loop                 recorder in place.  Sonographer:    Clayton Lefort RDCS (AE) Referring Phys: [id number] [facility]  Sonographer Comments: No IV access for Definity use at  time of echocardiogram. IMPRESSIONS  1. Left ventricular ejection fraction, by estimation, is 50 to 55%. The left ventricle has low normal function. The left ventricle has no regional wall motion abnormalities. Left ventricular diastolic parameters are consistent with Grade III diastolic dysfunction (restrictive). Elevated left ventricular end-diastolic pressure.  2. Right ventricular systolic function is normal. The right ventricular size is moderately enlarged. Tricuspid regurgitation signal is inadequate for assessing PA pressure.  3. Left atrial size was moderately dilated.  4. Right atrial size was moderately dilated.  5. The mitral valve is abnormal. Trivial mitral valve regurgitation. No evidence of mitral stenosis. Moderate mitral annular calcification.  6. The aortic valve is tricuspid. There is moderate calcification of the aortic valve. There is moderate thickening of the aortic valve. Aortic valve regurgitation is not visualized. Mild aortic valve sclerosis is present, with no evidence of aortic valve stenosis.  7. The inferior vena cava is normal in size with greater than 50% respiratory variability, suggesting right atrial pressure of 3 mmHg. Comparison(s): No significant change from prior study. Conclusion(s)/Recommendation(s): No evidence of valvular vegetations on this transthoracic echocardiogram. Would recommend a transesophageal echocardiogram to exclude infective endocarditis if clinically indicated. FINDINGS  Left Ventricle: Left ventricular ejection fraction, by estimation, is 50 to 55%. The left ventricle has low normal function. The left ventricle has no regional wall motion abnormalities. The left ventricular internal cavity size was normal in size. There is no left ventricular hypertrophy. Left ventricular diastolic parameters are consistent with Grade III diastolic dysfunction (restrictive). Elevated left ventricular end-diastolic pressure. Right Ventricle: The right ventricular size is  moderately enlarged. Right vetricular wall thickness was not well visualized. Right ventricular systolic function is normal. Tricuspid regurgitation signal is inadequate for assessing PA pressure. Left Atrium: Left atrial size was moderately dilated. Right Atrium: Right atrial size was moderately dilated. Pericardium: There is no evidence of pericardial effusion. Presence of pericardial fat pad. Mitral Valve: The mitral valve is abnormal. There is moderate thickening of the mitral valve  leaflet(s). There is moderate calcification of the mitral valve leaflet(s). Moderate mitral annular calcification. Trivial mitral valve regurgitation. No evidence of mitral valve stenosis. MV peak gradient, 3.4 mmHg. The mean mitral valve gradient is 1.0 mmHg. Tricuspid Valve: The tricuspid valve is normal in structure. Tricuspid valve regurgitation is trivial. Aortic Valve: The aortic valve is tricuspid. There is moderate calcification of the aortic valve. There is moderate thickening of the aortic valve. Aortic valve regurgitation is not visualized. Mild aortic valve sclerosis is present, with no evidence of aortic valve stenosis. Aortic valve mean gradient measures 7.0 mmHg. Aortic valve peak gradient measures 13.8 mmHg. Aortic valve area, by VTI measures 1.39 cm. Pulmonic Valve: The pulmonic valve was not well visualized. Pulmonic valve regurgitation is not visualized. Aorta: The aortic root was not well visualized and the aortic root, ascending aorta and aortic arch are all structurally normal, with no evidence of dilitation or obstruction. Venous: The inferior vena cava is normal in size with greater than 50% respiratory variability, suggesting right atrial pressure of 3 mmHg. IAS/Shunts: The atrial septum is grossly normal.  LEFT VENTRICLE PLAX 2D LVIDd:         5.60 cm  Diastology LVIDs:         3.90 cm  LV e' medial:    5.23 cm/s LV PW:         1.10 cm  LV E/e' medial:  19.3 LV IVS:        0.90 cm  LV e' lateral:   6.58  cm/s LVOT diam:     2.00 cm  LV E/e' lateral: 15.3 LV SV:         61 LV SV Index:   30 LVOT Area:     3.14 cm  RIGHT VENTRICLE            IVC RV Basal diam:  4.10 cm    IVC diam: 1.60 cm RV Mid diam:    3.20 cm RV S prime:     9.51 cm/s TAPSE (M-mode): 1.9 cm LEFT ATRIUM             Index       RIGHT ATRIUM           Index LA diam:        4.60 cm 2.29 cm/m  RA Area:     20.70 cm LA Vol (A2C):   91.6 ml 45.63 ml/m RA Volume:   61.90 ml  30.83 ml/m LA Vol (A4C):   78.0 ml 38.85 ml/m LA Biplane Vol: 85.0 ml 42.34 ml/m  AORTIC VALVE AV Area (Vmax):    1.50 cm AV Area (Vmean):   1.44 cm AV Area (VTI):     1.39 cm AV Vmax:           186.00 cm/s AV Vmean:          122.000 cm/s AV VTI:            0.435 m AV Peak Grad:      13.8 mmHg AV Mean Grad:      7.0 mmHg LVOT Vmax:         88.70 cm/s LVOT Vmean:        56.100 cm/s LVOT VTI:          0.193 m LVOT/AV VTI ratio: 0.44  AORTA Ao Root diam: 3.40 cm Ao Asc diam:  3.50 cm MITRAL VALVE MV Area (PHT): 4.31 cm     SHUNTS MV Area VTI:   1.92  cm     Systemic VTI:  0.19 m MV Peak grad:  3.4 mmHg     Systemic Diam: 2.00 cm MV Mean grad:  1.0 mmHg MV Vmax:       0.92 m/s MV Vmean:      47.6 cm/s MV Decel Time: 176 msec MV E velocity: 101.00 cm/s MV A velocity: 36.50 cm/s MV E/A ratio:  2.77 Buford Dresser MD Electronically signed by Buford Dresser MD Signature Date/Time: 06/23/2020/5:59:13 PM    Final    Medications: .  ceFAZolin (ANCEF) IV 1 g (06/24/20 1745)   . amiodarone  200 mg Oral Daily  . Chlorhexidine Gluconate Cloth  6 each Topical Daily  . Chlorhexidine Gluconate Cloth  6 each Topical Q0600  . clonazePAM  1 mg Oral Daily  . estradiol  1 mg Oral Daily  . famotidine  20 mg Oral Q2000  . insulin aspart  0-5 Units Subcutaneous QHS  . insulin aspart  0-6 Units Subcutaneous TID WC  . levothyroxine  175 mcg Oral QAC breakfast  . simvastatin  10 mg Oral QHS    Outpatient Dialysis Orders: [facility] 3.5hrsTTS EDW98 kg, 2K, 2 CA  bath,Heparin none. Accessdid have a right IJ PermCath(now removed)recent clotted left femoral AVGG Calcitriol 0.7 43mcg p.o./HD Mircera60MCG every 2 weeks last given 06/14/20   Assessment/Plan: 1. MSSA bacteremia(etiology thought to be a right IJ PermCath)= ID consulted, currently on Ancef;HD PermCath removed 5/27. BCx from 5/27 still positive but redrawn 5/29 as pt is clinically much improved- negative to date. New TDC placed this AM. Appreciate ID and VVS  2. ESRD -HD TTS,last dialysis 5/26 w/ no c/o dyspnea. Renal labs remain stable. Planned for HD this afternoon, continue TTS schedule 3. Hypertension/volume -originally hypotensive in ER given IV fluids. Currently BP stable, no excess volume on chest x-ray, mild volume on exam. UF with HD as tolerated.  4. Anemia -Hgb 9.8, not due for ESA dose yet 5. Metabolic bone disease -corrected calcium and phos controlled. Continue VDRA and binders 6. Proximal A. Fib-currently sinus rhythm Eliquis on hold on amiodarone plan per admit 7. History of chronic respiratory failure- currently no changes in oxygen requirements, no shortness of breath 8. Diabetes mellitus type 2 per admit 9. Debility and recent falls-PT OT consulting    Anice Paganini, PA-C 06/25/2020, 1:04 PM  [facility] Pager: [phone]

## 2020-06-25 NOTE — Anesthesia Postprocedure Evaluation (Signed)
Anesthesia Post Note  Patient: [name]  Procedure(s) Performed: INSERTION OF LEFT INTERNAL JUGULAR TUNNELED  DIALYSIS CATHETER (Left )     Patient location during evaluation: PACU Anesthesia Type: MAC Level of consciousness: awake and alert Pain management: pain level controlled Vital Signs Assessment: post-procedure vital signs reviewed and stable Respiratory status: spontaneous breathing and respiratory function stable Cardiovascular status: stable Postop Assessment: no apparent nausea or vomiting Anesthetic complications: no   No complications documented.  Last Vitals:  Vitals:   06/25/20 1210 06/25/20 1235  BP: (!) 96/46 (!) 92/46  Pulse: (!) 56   Resp: 18   Temp:  36.4 C  SpO2: 100%     Last Pain:  Vitals:   06/25/20 1220  TempSrc:   PainSc: Asleep                 Casilda Pickerill DANIEL

## 2020-06-25 NOTE — Progress Notes (Signed)
PROGRESS NOTE    [name]  VVO:[id number] DOB: 1940-02-04 DOA: 06/20/2020 PCP: Wannetta Sender, FNP    Brief Narrative:  80 year old female with extensive medical issues, ESRD on hemodialysis TTS schedule, paroxysmal A. fib on Eliquis, chronic respiratory failure on home oxygen, obesity, coronary artery disease, combined chronic heart failure, type 2 diabetes, hypertension, hyperlipidemia, hypothyroidism, diverticulosis and multiple comorbidities recently underwent tunneled IJ catheter placement on 5/20 due to occlusion of left femoral dialysis catheter.  She has frequent fall.  Also fell on 5/21.  Was complaining of right neck pain after catheter placement.  Continue to complain of back pain and right hip pain so came to ER. In the emergency room febrile and tachycardic on arrival.  Had received dialysis on 5/26 the day of admission.  COVID-19 negative.  Chest x-ray with streaky opacity left lung base.  Head CT negative.  X-ray of the right hip and pelvis without any injuries.  Started on daptomycin and ceftriaxone after blood cultures and admit to the hospital.  Right tunneled IJ catheter was obviously infected on presentation. Blood cultures with MSSA bacteremia.  Assessment & Plan:   Principal Problem:   MSSA bacteremia Active Problems:   Hypothyroidism   Hyperlipidemia   Atrial fibrillation ([place])   ESRD (end stage renal disease) ([place])  Severe sepsis secondary to MSSA bacteremia, infected indwelling tunneled dialysis catheter, POA: 5/26, blood cultures with MSSA 5/27, repeat blood cultures still growing MSSA, was drawn while catheter was a still in.   5/27, removal of right IJ permacath. 5/29, repeat blood cultures drawn, no growth so far.  Currently on cefazolin.  Followed by ID-appreciate insight and recommendations TTE with no obvious vegetation.  Cardiology following for TEE. Permacath placed 06/25/2020 after recurrent cultures remain negative greater than 48  hours.  ESRD on HD: TTS schedule . Received HD 5/26.   Plan for dialysis later today per schedule Nephrology following, appreciate insight and recommendations  Paroxysmal atrial fibrillation: Patient currently in sinus rhythm.  Eliquis on hold for permacath placement, likely reinitiate in the next 24 hours.  On amiodarone.  No indication for bridging.  Type 2 diabetes on insulin: Remains on sliding scale.  Stable.  Chronic respiratory failure: No change in oxygen requirement.  Fairly stable.  Ambulatory dysfunction, debility and frailty/frequent falls: Work with PT OT.  Lives at home with family.   DVT prophylaxis: Place and maintain sequential compression device Start: 06/20/20 2134 **Eliquis on hold for procedure as above -restart in the next 24 hours assuming no contraindications or signs of bleeding postprocedure** Code Status: Full code Family Communication: Daughter and son-in-law at bedside updated at length about patient's current diagnosis, prognosis and disposition plan Disposition Plan: Status is: Inpatient  Remains inpatient appropriate because:Ongoing diagnostic testing needed not appropriate for outpatient work up and Inpatient level of care appropriate due to severity of illness   Dispo: The patient is from: Home              Anticipated d/c is to: Home with home health -likely DC in the next 48 to 72 hours              Patient currently is not medically stable to d/c.   Difficult to place patient No   Consultants:  Vascular surgery Nephrology Infectious disease  Procedures:   None  Antimicrobials:   Daptomycin and Rocephin 5/26--5/27  Cefazolin 5/28--- ongoing  Subjective: No acute issues or events overnight, tolerated procedure quite well denies nausea vomiting diarrhea  constipation headache fevers or chills.   Objective: Vitals:   06/24/20 0453 06/24/20 1500 06/24/20 2030 06/25/20 0413  BP: 114/61 (!) 143/69 124/60 104/60  Pulse: (!) 57 (!) 58 (!)  56 (!) 56  Resp: 13 18 16 17   Temp: 97.8 F (36.6 C) 97.7 F (36.5 C) 97.8 F (36.6 C) 97.8 F (36.6 C)  TempSrc: Oral Oral Oral Oral  SpO2: 93% 99% 97% 99%  Weight:      Height:        Intake/Output Summary (Last 24 hours) at 06/25/2020 0734 Last data filed at 06/25/2020 0100 Gross per 24 hour  Intake 120 ml  Output 100 ml  Net 20 ml   Filed Weights   06/20/20 2044 06/21/20 0254  Weight: 99.5 kg 99.1 kg    Examination:  General:  Pleasantly resting in bed, No acute distress. HEENT:  Normocephalic atraumatic.  Sclerae nonicteric, noninjected.  Extraocular movements intact bilaterally.  Left supraclavicular bandage clean dry intact Neck:  Without mass or deformity.  Trachea is midline. Lungs:  Clear to auscultate bilaterally without rhonchi, wheeze, or rales. Heart:  Regular rate and rhythm.  Without murmurs, rubs, or gallops. Abdomen:  Soft, nontender, nondistended.  Ostomy with brown liquidy stool site otherwise clean dry intact Extremities: Without cyanosis, clubbing, 1-2+ pitting edema bilateral lower extremities, or obvious deformity. Vascular:  Dorsalis pedis and posterior tibial pulses palpable bilaterally. Skin:  Warm and dry, no erythema, no ulcerations.   Data Reviewed: I have personally reviewed following labs and imaging studies  CBC: Recent Labs  Lab 06/20/20 1649 06/21/20 0226 06/23/20 0111  WBC 13.1* 13.5* 7.2  NEUTROABS 11.1*  --  4.6  HGB 11.5* 10.1* 9.8*  HCT 37.4 33.3* 31.2*  MCV 100.0 101.5* 98.4  PLT 230 203 253   Basic Metabolic Panel: Recent Labs  Lab 06/20/20 1649 06/21/20 0226 06/22/20 0022 06/23/20 0111 06/24/20 0028 06/25/20 0027  NA 139 138  --  133* 133* 132*  K 3.7 3.9  --  3.9 4.3 4.1  CL 99 99  --  98 99 97*  CO2 30 30  --  26 24 23   GLUCOSE 140* 101*  --  144* 170* 151*  BUN 9 10  --  31* 37* 45*  CREATININE 3.46* 4.08*  --  6.26* 6.99* 7.26*  CALCIUM 8.0* 8.1*  --  8.7* 8.7* 8.7*  PHOS  --   --  4.5  --   --   --     GFR: Estimated Creatinine Clearance: 7.3 mL/min (A) (by C-G formula based on SCr of 7.26 mg/dL (H)). Liver Function Tests: Recent Labs  Lab 06/20/20 1649  AST 30  ALT 17  ALKPHOS 121  BILITOT 1.1  PROT 6.6  ALBUMIN 2.9*   No results for input(s): LIPASE, AMYLASE in the last 168 hours. No results for input(s): AMMONIA in the last 168 hours. Coagulation Profile: Recent Labs  Lab 06/20/20 1649  INR 1.6*   Cardiac Enzymes: Recent Labs  Lab 06/21/20 0226  CKTOTAL 18*   BNP (last 3 results) No results for input(s): PROBNP in the last 8760 hours. HbA1C: No results for input(s): HGBA1C in the last 72 hours. CBG: Recent Labs  Lab 06/24/20 0459 06/24/20 0744 06/24/20 1241 06/24/20 1724 06/24/20 2032  GLUCAP 136* 156* 201* 178* 169*   Lipid Profile: No results for input(s): CHOL, HDL, LDLCALC, TRIG, CHOLHDL, LDLDIRECT in the last 72 hours. Thyroid Function Tests: No results for input(s): TSH, T4TOTAL, FREET4, T3FREE, THYROIDAB in  the last 72 hours. Anemia Panel: No results for input(s): VITAMINB12, FOLATE, FERRITIN, TIBC, IRON, RETICCTPCT in the last 72 hours. Sepsis Labs: Recent Labs  Lab 06/20/20 2209 06/21/20 0226 06/21/20 0714 06/21/20 1024  LATICACIDVEN 3.7* 2.4* 1.8 1.7    Recent Results (from the past 240 hour(s))  Blood culture (routine x 2)     Status: Abnormal   Collection Time: 06/20/20  4:55 PM   Specimen: BLOOD RIGHT HAND  Result Value Ref Range Status   Specimen Description BLOOD RIGHT HAND  Final   Special Requests   Final    BOTTLES DRAWN AEROBIC AND ANAEROBIC Blood Culture results may not be optimal due to an inadequate volume of blood received in culture bottles   Culture  Setup Time   Final    GRAM POSITIVE COCCI IN CLUSTERS IN BOTH AEROBIC AND ANAEROBIC BOTTLES CRITICAL VALUE NOTED.  VALUE IS CONSISTENT WITH PREVIOUSLY REPORTED AND CALLED VALUE.    Culture (A)  Final    STAPHYLOCOCCUS AUREUS SUSCEPTIBILITIES PERFORMED ON PREVIOUS  CULTURE WITHIN THE LAST 5 DAYS. Performed at [facility], [place] [address] [place], [place] [address]    Report Status 06/23/2020 FINAL  Final  Resp Panel by RT-PCR (Flu A&B, Covid) Nasopharyngeal Swab     Status: None   Collection Time: 06/20/20  5:51 PM   Specimen: Nasopharyngeal Swab; Nasopharyngeal(NP) swabs in vial transport medium  Result Value Ref Range Status   SARS Coronavirus 2 by RT PCR NEGATIVE NEGATIVE Final    Comment: (NOTE) SARS-CoV-2 target nucleic acids are NOT DETECTED.  The SARS-CoV-2 RNA is generally detectable in upper respiratory specimens during the acute phase of infection. The lowest concentration of SARS-CoV-2 viral copies this assay can detect is 138 copies/mL. A negative result does not preclude SARS-Cov-2 infection and should not be used as the sole basis for treatment or other patient management decisions. A negative result may occur with  improper specimen collection/handling, submission of specimen other than nasopharyngeal swab, presence of viral mutation(s) within the areas targeted by this assay, and inadequate number of viral copies(<138 copies/mL). A negative result must be combined with clinical observations, patient history, and epidemiological information. The expected result is Negative.  Fact Sheet for Patients:  EntrepreneurPulse.com.au  Fact Sheet for Healthcare Providers:  IncredibleEmployment.be  This test is no t yet approved or cleared by the [place] FDA and  has been authorized for detection and/or diagnosis of SARS-CoV-2 by FDA under an Emergency Use Authorization (EUA). This EUA will remain  in effect (meaning this test can be used) for the duration of the COVID-19 declaration under Section 564(b)(1) of the Act, 21 U.S.C.section 360bbb-3(b)(1), unless the authorization is terminated  or revoked sooner.       Influenza A by PCR NEGATIVE NEGATIVE Final   Influenza B by PCR  NEGATIVE NEGATIVE Final    Comment: (NOTE) The Xpert Xpress SARS-CoV-2/FLU/RSV plus assay is intended as an aid in the diagnosis of influenza from Nasopharyngeal swab specimens and should not be used as a sole basis for treatment. Nasal washings and aspirates are unacceptable for Xpert Xpress SARS-CoV-2/FLU/RSV testing.  Fact Sheet for Patients: EntrepreneurPulse.com.au  Fact Sheet for Healthcare Providers: IncredibleEmployment.be  This test is not yet approved or cleared by the [place] FDA and has been authorized for detection and/or diagnosis of SARS-CoV-2 by FDA under an Emergency Use Authorization (EUA). This EUA will remain in effect (meaning this test can be used) for the duration of the COVID-19 declaration  under Section 564(b)(1) of the Act, 21 U.S.C. section 360bbb-3(b)(1), unless the authorization is terminated or revoked.  Performed at [facility], [place] [address] [place], [place] [address]   Blood culture (routine x 2)     Status: Abnormal   Collection Time: 06/20/20  6:25 PM   Specimen: Right Antecubital; Blood  Result Value Ref Range Status   Specimen Description RIGHT ANTECUBITAL  Final   Special Requests   Final    BOTTLES DRAWN AEROBIC ONLY Blood Culture adequate volume   Culture  Setup Time   Final    GRAM POSITIVE COCCI IN CLUSTERS AEROBIC BOTTLE ONLY CRITICAL RESULT CALLED TO, READ BACK BY AND VERIFIED WITH: PHARMD M.YATES AT 7026 ON 06/21/2020 BY T.SAAD. Performed at [facility], [place] [address] [place], [place] [address]    Culture STAPHYLOCOCCUS AUREUS (A)  Final   Report Status 06/23/2020 FINAL  Final   Organism ID, Bacteria STAPHYLOCOCCUS AUREUS  Final      Susceptibility   Staphylococcus aureus - MIC*    CIPROFLOXACIN <=0.5 SENSITIVE Sensitive     ERYTHROMYCIN RESISTANT Resistant     GENTAMICIN <=0.5 SENSITIVE Sensitive     OXACILLIN 0.5 SENSITIVE Sensitive     TETRACYCLINE <=1 SENSITIVE  Sensitive     VANCOMYCIN <=0.5 SENSITIVE Sensitive     TRIMETH/SULFA <=10 SENSITIVE Sensitive     CLINDAMYCIN RESISTANT Resistant     RIFAMPIN <=0.5 SENSITIVE Sensitive     Inducible Clindamycin POSITIVE Resistant     * STAPHYLOCOCCUS AUREUS  Blood Culture ID Panel (Reflexed)     Status: Abnormal   Collection Time: 06/20/20  6:25 PM  Result Value Ref Range Status   Enterococcus faecalis NOT DETECTED NOT DETECTED Final   Enterococcus Faecium NOT DETECTED NOT DETECTED Final   Listeria monocytogenes NOT DETECTED NOT DETECTED Final   Staphylococcus species DETECTED (A) NOT DETECTED Final   Staphylococcus aureus (BCID) DETECTED (A) NOT DETECTED Final    Comment: CRITICAL RESULT CALLED TO, READ BACK BY AND VERIFIED WITH: M. PHAM PHARMD AT 1915 06/21/20 BY D. VANHOOK    Staphylococcus epidermidis NOT DETECTED NOT DETECTED Final   Staphylococcus lugdunensis NOT DETECTED NOT DETECTED Final   Streptococcus species NOT DETECTED NOT DETECTED Final   Streptococcus agalactiae NOT DETECTED NOT DETECTED Final   Streptococcus pneumoniae NOT DETECTED NOT DETECTED Final   Streptococcus pyogenes NOT DETECTED NOT DETECTED Final   A.calcoaceticus-baumannii NOT DETECTED NOT DETECTED Final   Bacteroides fragilis NOT DETECTED NOT DETECTED Final   Enterobacterales NOT DETECTED NOT DETECTED Final   Enterobacter cloacae complex NOT DETECTED NOT DETECTED Final   Escherichia coli NOT DETECTED NOT DETECTED Final   Klebsiella aerogenes NOT DETECTED NOT DETECTED Final   Klebsiella oxytoca NOT DETECTED NOT DETECTED Final   Klebsiella pneumoniae NOT DETECTED NOT DETECTED Final   Proteus species NOT DETECTED NOT DETECTED Final   Salmonella species NOT DETECTED NOT DETECTED Final   Serratia marcescens NOT DETECTED NOT DETECTED Final   Haemophilus influenzae NOT DETECTED NOT DETECTED Final   Neisseria meningitidis NOT DETECTED NOT DETECTED Final   Pseudomonas aeruginosa NOT DETECTED NOT DETECTED Final    Stenotrophomonas maltophilia NOT DETECTED NOT DETECTED Final   Candida albicans NOT DETECTED NOT DETECTED Final   Candida auris NOT DETECTED NOT DETECTED Final   Candida glabrata NOT DETECTED NOT DETECTED Final   Candida krusei NOT DETECTED NOT DETECTED Final   Candida parapsilosis NOT DETECTED NOT DETECTED Final   Candida tropicalis NOT DETECTED  NOT DETECTED Final   Cryptococcus neoformans/gattii NOT DETECTED NOT DETECTED Final   Meth resistant mecA/C and MREJ NOT DETECTED NOT DETECTED Final    Comment: Performed at [facility], [place] [address] [place], [place] [address]  Culture, blood (routine x 2)     Status: Abnormal   Collection Time: 06/21/20  3:01 PM   Specimen: BLOOD  Result Value Ref Range Status   Specimen Description BLOOD LEFT ANTECUBITAL  Final   Special Requests   Final    BOTTLES DRAWN AEROBIC ONLY Blood Culture results may not be optimal due to an inadequate volume of blood received in culture bottles   Culture  Setup Time   Final    GRAM POSITIVE COCCI IN CLUSTERS AEROBIC BOTTLE ONLY CRITICAL VALUE NOTED.  VALUE IS CONSISTENT WITH PREVIOUSLY REPORTED AND CALLED VALUE.    Culture (A)  Final    STAPHYLOCOCCUS AUREUS SUSCEPTIBILITIES PERFORMED ON PREVIOUS CULTURE WITHIN THE LAST 5 DAYS. Performed at [facility], [place] [address] [place], [place] [address]    Report Status 06/24/2020 FINAL  Final  Culture, blood (routine x 2)     Status: Abnormal   Collection Time: 06/21/20  3:10 PM   Specimen: BLOOD RIGHT HAND  Result Value Ref Range Status   Specimen Description BLOOD RIGHT HAND  Final   Special Requests   Final    BOTTLES DRAWN AEROBIC ONLY Blood Culture results may not be optimal due to an inadequate volume of blood received in culture bottles   Culture  Setup Time   Final    GRAM POSITIVE COCCI IN CLUSTERS AEROBIC BOTTLE ONLY CRITICAL VALUE NOTED.  VALUE IS CONSISTENT WITH PREVIOUSLY REPORTED AND CALLED VALUE.    Culture (A)  Final     STAPHYLOCOCCUS AUREUS SUSCEPTIBILITIES PERFORMED ON PREVIOUS CULTURE WITHIN THE LAST 5 DAYS. Performed at [facility], [place] [address] [place], [place] [address]    Report Status 06/24/2020 FINAL  Final  Culture, blood (Routine X 2) w Reflex to ID Panel     Status: None (Preliminary result)   Collection Time: 06/23/20 12:57 PM   Specimen: BLOOD RIGHT ARM  Result Value Ref Range Status   Specimen Description BLOOD RIGHT ARM  Final   Special Requests   Final    BOTTLES DRAWN AEROBIC ONLY Blood Culture results may not be optimal due to an inadequate volume of blood received in culture bottles   Culture   Final    NO GROWTH < 24 HOURS Performed at [facility], [place] [address], [place], [place] [address]    Report Status PENDING  Incomplete  Culture, blood (Routine X 2) w Reflex to ID Panel     Status: None (Preliminary result)   Collection Time: 06/23/20  1:05 PM   Specimen: BLOOD RIGHT ARM  Result Value Ref Range Status   Specimen Description BLOOD RIGHT ARM  Final   Special Requests   Final    BOTTLES DRAWN AEROBIC ONLY Blood Culture results may not be optimal due to an inadequate volume of blood received in culture bottles   Culture   Final    NO GROWTH < 24 HOURS Performed at [facility], [place] [address], [place], [place] [address]    Report Status PENDING  Incomplete         Radiology Studies: ECHOCARDIOGRAM COMPLETE  Result Date: 06/23/2020    ECHOCARDIOGRAM REPORT   Patient Name:   BRITTON PERKINSON Date  of Exam: 06/23/2020 Medical Rec #:  [id number]        Height:       63.0 in Accession #:    [id number]       Weight:       218.5 lb Date of Birth:  Dec 20, 1940        BSA:          2.008 m Patient Age:    89 years         BP:           99/54 mmHg Patient Gender: F                HR:           56 bpm. Exam Location:  Inpatient Procedure: 2D Echo, Cardiac Doppler and Color Doppler Indications:    Bacteremia  History:        Patient has prior history of  Echocardiogram examinations, most                 recent 11/22/2016. CHF, Previous Myocardial Infarction and CAD;                 Risk Factors:Dyslipidemia and Diabetes. ESRD. Hx atrial                 fibrillation. ESRD. CKD. Patient states she has an old loop                 recorder in place.  Sonographer:    Clayton Lefort RDCS (AE) Referring Phys: [id number] [facility]  Sonographer Comments: No IV access for Definity use at time of echocardiogram. IMPRESSIONS  1. Left ventricular ejection fraction, by estimation, is 50 to 55%. The left ventricle has low normal function. The left ventricle has no regional wall motion abnormalities. Left ventricular diastolic parameters are consistent with Grade III diastolic dysfunction (restrictive). Elevated left ventricular end-diastolic pressure.  2. Right ventricular systolic function is normal. The right ventricular size is moderately enlarged. Tricuspid regurgitation signal is inadequate for assessing PA pressure.  3. Left atrial size was moderately dilated.  4. Right atrial size was moderately dilated.  5. The mitral valve is abnormal. Trivial mitral valve regurgitation. No evidence of mitral stenosis. Moderate mitral annular calcification.  6. The aortic valve is tricuspid. There is moderate calcification of the aortic valve. There is moderate thickening of the aortic valve. Aortic valve regurgitation is not visualized. Mild aortic valve sclerosis is present, with no evidence of aortic valve stenosis.  7. The inferior vena cava is normal in size with greater than 50% respiratory variability, suggesting right atrial pressure of 3 mmHg. Comparison(s): No significant change from prior study. Conclusion(s)/Recommendation(s): No evidence of valvular vegetations on this transthoracic echocardiogram. Would recommend a transesophageal echocardiogram to exclude infective endocarditis if clinically indicated. FINDINGS  Left Ventricle: Left ventricular ejection fraction, by  estimation, is 50 to 55%. The left ventricle has low normal function. The left ventricle has no regional wall motion abnormalities. The left ventricular internal cavity size was normal in size. There is no left ventricular hypertrophy. Left ventricular diastolic parameters are consistent with Grade III diastolic dysfunction (restrictive). Elevated left ventricular end-diastolic pressure. Right Ventricle: The right ventricular size is moderately enlarged. Right vetricular wall thickness was not well visualized. Right ventricular systolic function is normal. Tricuspid regurgitation signal is inadequate for assessing PA pressure. Left Atrium: Left atrial size was moderately dilated. Right Atrium: Right atrial size was moderately dilated. Pericardium: There is no evidence of  pericardial effusion. Presence of pericardial fat pad. Mitral Valve: The mitral valve is abnormal. There is moderate thickening of the mitral valve leaflet(s). There is moderate calcification of the mitral valve leaflet(s). Moderate mitral annular calcification. Trivial mitral valve regurgitation. No evidence of mitral valve stenosis. MV peak gradient, 3.4 mmHg. The mean mitral valve gradient is 1.0 mmHg. Tricuspid Valve: The tricuspid valve is normal in structure. Tricuspid valve regurgitation is trivial. Aortic Valve: The aortic valve is tricuspid. There is moderate calcification of the aortic valve. There is moderate thickening of the aortic valve. Aortic valve regurgitation is not visualized. Mild aortic valve sclerosis is present, with no evidence of aortic valve stenosis. Aortic valve mean gradient measures 7.0 mmHg. Aortic valve peak gradient measures 13.8 mmHg. Aortic valve area, by VTI measures 1.39 cm. Pulmonic Valve: The pulmonic valve was not well visualized. Pulmonic valve regurgitation is not visualized. Aorta: The aortic root was not well visualized and the aortic root, ascending aorta and aortic arch are all structurally normal,  with no evidence of dilitation or obstruction. Venous: The inferior vena cava is normal in size with greater than 50% respiratory variability, suggesting right atrial pressure of 3 mmHg. IAS/Shunts: The atrial septum is grossly normal.  LEFT VENTRICLE PLAX 2D LVIDd:         5.60 cm  Diastology LVIDs:         3.90 cm  LV e' medial:    5.23 cm/s LV PW:         1.10 cm  LV E/e' medial:  19.3 LV IVS:        0.90 cm  LV e' lateral:   6.58 cm/s LVOT diam:     2.00 cm  LV E/e' lateral: 15.3 LV SV:         61 LV SV Index:   30 LVOT Area:     3.14 cm  RIGHT VENTRICLE            IVC RV Basal diam:  4.10 cm    IVC diam: 1.60 cm RV Mid diam:    3.20 cm RV S prime:     9.51 cm/s TAPSE (M-mode): 1.9 cm LEFT ATRIUM             Index       RIGHT ATRIUM           Index LA diam:        4.60 cm 2.29 cm/m  RA Area:     20.70 cm LA Vol (A2C):   91.6 ml 45.63 ml/m RA Volume:   61.90 ml  30.83 ml/m LA Vol (A4C):   78.0 ml 38.85 ml/m LA Biplane Vol: 85.0 ml 42.34 ml/m  AORTIC VALVE AV Area (Vmax):    1.50 cm AV Area (Vmean):   1.44 cm AV Area (VTI):     1.39 cm AV Vmax:           186.00 cm/s AV Vmean:          122.000 cm/s AV VTI:            0.435 m AV Peak Grad:      13.8 mmHg AV Mean Grad:      7.0 mmHg LVOT Vmax:         88.70 cm/s LVOT Vmean:        56.100 cm/s LVOT VTI:          0.193 m LVOT/AV VTI ratio: 0.44  AORTA Ao Root diam: 3.40 cm Ao Asc  diam:  3.50 cm MITRAL VALVE MV Area (PHT): 4.31 cm     SHUNTS MV Area VTI:   1.92 cm     Systemic VTI:  0.19 m MV Peak grad:  3.4 mmHg     Systemic Diam: 2.00 cm MV Mean grad:  1.0 mmHg MV Vmax:       0.92 m/s MV Vmean:      47.6 cm/s MV Decel Time: 176 msec MV E velocity: 101.00 cm/s MV A velocity: 36.50 cm/s MV E/A ratio:  2.77 Buford Dresser MD Electronically signed by Buford Dresser MD Signature Date/Time: 06/23/2020/5:59:13 PM    Final    Scheduled Meds: . amiodarone  200 mg Oral Daily  . Chlorhexidine Gluconate Cloth  6 each Topical Daily  . Chlorhexidine  Gluconate Cloth  6 each Topical Q0600  . clonazePAM  1 mg Oral Daily  . estradiol  1 mg Oral Daily  . famotidine  20 mg Oral Q2000  . insulin aspart  0-5 Units Subcutaneous QHS  . insulin aspart  0-6 Units Subcutaneous TID WC  . levothyroxine  175 mcg Oral QAC breakfast  . simvastatin  10 mg Oral QHS   Continuous Infusions: .  ceFAZolin (ANCEF) IV 1 g (06/24/20 1745)    LOS: 5 days   Time spent: 30 minutes  Little Ishikawa, DO [facility] Pager: Secure chat  Between 7 PM and 7 AM please see night call on Amion

## 2020-06-25 NOTE — Progress Notes (Addendum)
[facility] Infectious Diseases Follow Up Note  Patient Identification: Patient Name: [name] MRN: [id number] [place] Date: 06/20/2020  4:16 PM Age: 80 y.o.Today's Date: 06/25/2020   Reason for Visit: MSSA bacteremia   Principal Problem:   MSSA bacteremia Active Problems:   Hypothyroidism   Hyperlipidemia   Atrial fibrillation ([place])   ESRD (end stage renal disease) (HCC)  Antibiotics: cefazolin - current                     Total days of antibiotics 6   Lines/Tubes:  AVG , colostomy RLQ, PIVs   Interval Events: Remains afebrile, no leukocytosis, plan for TEE today   Assessment  High-grade MSSA bacteremia/CLABSI of right IJ hemodialysis catheter ( removed on 5/27). Repeat blood cx prior to CVC removed on 5/27 positive for staph aureus Repeat blood cx 5/29 no growth in 2 days  Left thigh AVG TTE negative for vegetations TEE planned for today  Complex fluid and edema tracking from the right IJ into the soft tissues along the course of the catheter (No drainable fluid collection or abscess). Probable small clot within the lumen of the IJ at the insertion of the catheter.  ESRD on hemodialysis  Recommendations Continue cefazolin as is for now  Line holiday Fu TEE Results Monitor CBC, BMP Following   Rest of the management as per the primary team. Thank you for the consult. Please page with pertinent questions or concerns.  ______________________________________________________________________ Subjective Patient off the floor for TEE  Vitals BP (!) 140/50   Pulse 60   Temp 98.4 F (36.9 C) (Oral)   Resp 17   Ht 5\' 3"  (1.6 m)   Wt 100.2 kg   SpO2 98%   BMI 39.15 kg/m    Pertinent Microbiology Results for orders placed or performed during the hospital encounter of 06/20/20  Blood culture (routine x 2)     Status: Abnormal   Collection Time: 06/20/20  4:55 PM   Specimen: BLOOD RIGHT HAND  Result  Value Ref Range Status   Specimen Description BLOOD RIGHT HAND  Final   Special Requests   Final    BOTTLES DRAWN AEROBIC AND ANAEROBIC Blood Culture results may not be optimal due to an inadequate volume of blood received in culture bottles   Culture  Setup Time   Final    GRAM POSITIVE COCCI IN CLUSTERS IN BOTH AEROBIC AND ANAEROBIC BOTTLES CRITICAL VALUE NOTED.  VALUE IS CONSISTENT WITH PREVIOUSLY REPORTED AND CALLED VALUE.    Culture (A)  Final    STAPHYLOCOCCUS AUREUS SUSCEPTIBILITIES PERFORMED ON PREVIOUS CULTURE WITHIN THE LAST 5 DAYS. Performed at [facility], [place] [address], [place], [place] [address]    Report Status 06/23/2020 FINAL  Final  Resp Panel by RT-PCR (Flu A&B, Covid) Nasopharyngeal Swab     Status: None   Collection Time: 06/20/20  5:51 PM   Specimen: Nasopharyngeal Swab; Nasopharyngeal(NP) swabs in vial transport medium  Result Value Ref Range Status   SARS Coronavirus 2 by RT PCR NEGATIVE NEGATIVE Final    Comment: (NOTE) SARS-CoV-2 target nucleic acids are NOT DETECTED.  The SARS-CoV-2 RNA is generally detectable in upper respiratory specimens during the acute phase of infection. The lowest concentration of SARS-CoV-2 viral copies this assay can detect is 138 copies/mL. A negative result does not preclude SARS-Cov-2 infection and should not be used as the sole basis for treatment or other patient management decisions. A negative result may occur with  improper specimen collection/handling, submission of specimen other than nasopharyngeal swab, presence of viral mutation(s) within the areas targeted by this assay, and inadequate number of viral copies(<138 copies/mL). A negative result must be combined with clinical observations, patient history, and epidemiological information. The expected result is Negative.  Fact Sheet for Patients:  EntrepreneurPulse.com.au  Fact Sheet for Healthcare Providers:   IncredibleEmployment.be  This test is no t yet approved or cleared by the [place] FDA and  has been authorized for detection and/or diagnosis of SARS-CoV-2 by FDA under an Emergency Use Authorization (EUA). This EUA will remain  in effect (meaning this test can be used) for the duration of the COVID-19 declaration under Section 564(b)(1) of the Act, 21 U.S.C.section 360bbb-3(b)(1), unless the authorization is terminated  or revoked sooner.       Influenza A by PCR NEGATIVE NEGATIVE Final   Influenza B by PCR NEGATIVE NEGATIVE Final    Comment: (NOTE) The Xpert Xpress SARS-CoV-2/FLU/RSV plus assay is intended as an aid in the diagnosis of influenza from Nasopharyngeal swab specimens and should not be used as a sole basis for treatment. Nasal washings and aspirates are unacceptable for Xpert Xpress SARS-CoV-2/FLU/RSV testing.  Fact Sheet for Patients: EntrepreneurPulse.com.au  Fact Sheet for Healthcare Providers: IncredibleEmployment.be  This test is not yet approved or cleared by the [place] FDA and has been authorized for detection and/or diagnosis of SARS-CoV-2 by FDA under an Emergency Use Authorization (EUA). This EUA will remain in effect (meaning this test can be used) for the duration of the COVID-19 declaration under Section 564(b)(1) of the Act, 21 U.S.C. section 360bbb-3(b)(1), unless the authorization is terminated or revoked.  Performed at [facility], [place] [address], [place], [place] [address]   Blood culture (routine x 2)     Status: Abnormal   Collection Time: 06/20/20  6:25 PM   Specimen: Right Antecubital; Blood  Result Value Ref Range Status   Specimen Description RIGHT ANTECUBITAL  Final   Special Requests   Final    BOTTLES DRAWN AEROBIC ONLY Blood Culture adequate volume   Culture  Setup Time   Final    GRAM POSITIVE COCCI IN CLUSTERS AEROBIC BOTTLE ONLY CRITICAL RESULT CALLED  TO, READ BACK BY AND VERIFIED WITH: PHARMD M.YATES AT 5697 ON 06/21/2020 BY T.SAAD. Performed at [facility], [place] [address] [place], [place] [address]    Culture STAPHYLOCOCCUS AUREUS (A)  Final   Report Status 06/23/2020 FINAL  Final   Organism ID, Bacteria STAPHYLOCOCCUS AUREUS  Final      Susceptibility   Staphylococcus aureus - MIC*    CIPROFLOXACIN <=0.5 SENSITIVE Sensitive     ERYTHROMYCIN RESISTANT Resistant     GENTAMICIN <=0.5 SENSITIVE Sensitive     OXACILLIN 0.5 SENSITIVE Sensitive     TETRACYCLINE <=1 SENSITIVE Sensitive     VANCOMYCIN <=0.5 SENSITIVE Sensitive     TRIMETH/SULFA <=10 SENSITIVE Sensitive     CLINDAMYCIN RESISTANT Resistant     RIFAMPIN <=0.5 SENSITIVE Sensitive     Inducible Clindamycin POSITIVE Resistant     * STAPHYLOCOCCUS AUREUS  Blood Culture ID Panel (Reflexed)     Status: Abnormal   Collection Time: 06/20/20  6:25 PM  Result Value Ref Range Status   Enterococcus faecalis NOT DETECTED NOT DETECTED Final   Enterococcus Faecium NOT DETECTED NOT DETECTED Final   Listeria monocytogenes NOT DETECTED NOT DETECTED Final   Staphylococcus species DETECTED (A) NOT DETECTED Final   Staphylococcus aureus (BCID) DETECTED (  A) NOT DETECTED Final    Comment: CRITICAL RESULT CALLED TO, READ BACK BY AND VERIFIED WITH: M. PHAM PHARMD AT 1915 06/21/20 BY D. VANHOOK    Staphylococcus epidermidis NOT DETECTED NOT DETECTED Final   Staphylococcus lugdunensis NOT DETECTED NOT DETECTED Final   Streptococcus species NOT DETECTED NOT DETECTED Final   Streptococcus agalactiae NOT DETECTED NOT DETECTED Final   Streptococcus pneumoniae NOT DETECTED NOT DETECTED Final   Streptococcus pyogenes NOT DETECTED NOT DETECTED Final   A.calcoaceticus-baumannii NOT DETECTED NOT DETECTED Final   Bacteroides fragilis NOT DETECTED NOT DETECTED Final   Enterobacterales NOT DETECTED NOT DETECTED Final   Enterobacter cloacae complex NOT DETECTED NOT DETECTED Final   Escherichia coli  NOT DETECTED NOT DETECTED Final   Klebsiella aerogenes NOT DETECTED NOT DETECTED Final   Klebsiella oxytoca NOT DETECTED NOT DETECTED Final   Klebsiella pneumoniae NOT DETECTED NOT DETECTED Final   Proteus species NOT DETECTED NOT DETECTED Final   Salmonella species NOT DETECTED NOT DETECTED Final   Serratia marcescens NOT DETECTED NOT DETECTED Final   Haemophilus influenzae NOT DETECTED NOT DETECTED Final   Neisseria meningitidis NOT DETECTED NOT DETECTED Final   Pseudomonas aeruginosa NOT DETECTED NOT DETECTED Final   Stenotrophomonas maltophilia NOT DETECTED NOT DETECTED Final   Candida albicans NOT DETECTED NOT DETECTED Final   Candida auris NOT DETECTED NOT DETECTED Final   Candida glabrata NOT DETECTED NOT DETECTED Final   Candida krusei NOT DETECTED NOT DETECTED Final   Candida parapsilosis NOT DETECTED NOT DETECTED Final   Candida tropicalis NOT DETECTED NOT DETECTED Final   Cryptococcus neoformans/gattii NOT DETECTED NOT DETECTED Final   Meth resistant mecA/C and MREJ NOT DETECTED NOT DETECTED Final    Comment: Performed at [facility], [address] [place], [place] [address]  Culture, blood (routine x 2)     Status: Abnormal   Collection Time: 06/21/20  3:01 PM   Specimen: BLOOD  Result Value Ref Range Status   Specimen Description BLOOD LEFT ANTECUBITAL  Final   Special Requests   Final    BOTTLES DRAWN AEROBIC ONLY Blood Culture results may not be optimal due to an inadequate volume of blood received in culture bottles   Culture  Setup Time   Final    GRAM POSITIVE COCCI IN CLUSTERS AEROBIC BOTTLE ONLY CRITICAL VALUE NOTED.  VALUE IS CONSISTENT WITH PREVIOUSLY REPORTED AND CALLED VALUE.    Culture (A)  Final    STAPHYLOCOCCUS AUREUS SUSCEPTIBILITIES PERFORMED ON PREVIOUS CULTURE WITHIN THE LAST 5 DAYS. Performed at [facility], [place] [address] [place], [place] [address]    Report Status 06/24/2020 FINAL  Final  Culture, blood (routine x 2)      Status: Abnormal   Collection Time: 06/21/20  3:10 PM   Specimen: BLOOD RIGHT HAND  Result Value Ref Range Status   Specimen Description BLOOD RIGHT HAND  Final   Special Requests   Final    BOTTLES DRAWN AEROBIC ONLY Blood Culture results may not be optimal due to an inadequate volume of blood received in culture bottles   Culture  Setup Time   Final    GRAM POSITIVE COCCI IN CLUSTERS AEROBIC BOTTLE ONLY CRITICAL VALUE NOTED.  VALUE IS CONSISTENT WITH PREVIOUSLY REPORTED AND CALLED VALUE.    Culture (A)  Final    STAPHYLOCOCCUS AUREUS SUSCEPTIBILITIES PERFORMED ON PREVIOUS CULTURE WITHIN THE LAST 5 DAYS. Performed at [facility], [place] [address], [place], [place] [address]  Report Status 06/24/2020 FINAL  Final  Culture, blood (Routine X 2) w Reflex to ID Panel     Status: None (Preliminary result)   Collection Time: 06/23/20 12:57 PM   Specimen: BLOOD RIGHT ARM  Result Value Ref Range Status   Specimen Description BLOOD RIGHT ARM  Final   Special Requests   Final    BOTTLES DRAWN AEROBIC ONLY Blood Culture results may not be optimal due to an inadequate volume of blood received in culture bottles   Culture   Final    NO GROWTH 2 DAYS Performed at [facility], [place] [address], [place], [place] [address]    Report Status PENDING  Incomplete  Culture, blood (Routine X 2) w Reflex to ID Panel     Status: None (Preliminary result)   Collection Time: 06/23/20  1:05 PM   Specimen: BLOOD RIGHT ARM  Result Value Ref Range Status   Specimen Description BLOOD RIGHT ARM  Final   Special Requests   Final    BOTTLES DRAWN AEROBIC ONLY Blood Culture results may not be optimal due to an inadequate volume of blood received in culture bottles   Culture   Final    NO GROWTH 2 DAYS Performed at [facility], [place] [address], [place], [place] [address]    Report Status PENDING  Incomplete    Pertinent Lab. CMP Latest Ref Rng & Units 06/25/2020 06/24/2020 06/23/2020  Glucose  70 - 99 mg/dL 151(H) 170(H) 144(H)  BUN 8 - 23 mg/dL 45(H) 37(H) 31(H)  Creatinine 0.44 - 1.00 mg/dL 7.26(H) 6.99(H) 6.26(H)  Sodium 135 - 145 mmol/L 132(L) 133(L) 133(L)  Potassium 3.5 - 5.1 mmol/L 4.1 4.3 3.9  Chloride 98 - 111 mmol/L 97(L) 99 98  CO2 22 - 32 mmol/L 23 24 26   Calcium 8.9 - 10.3 mg/dL 8.7(L) 8.7(L) 8.7(L)  Total Protein 6.5 - 8.1 g/dL - - -  Total Bilirubin 0.3 - 1.2 mg/dL - - -  Alkaline Phos 38 - 126 U/L - - -  AST 15 - 41 U/L - - -  ALT 0 - 44 U/L - - -     Pertinent Imaging today Plain films and CT images have been personally visualized and interpreted; radiology reports have been reviewed. Decision making incorporated into the Impression / Recommendations.  [place] RT chest 06/21/20 IMPRESSION: 1. Complex fluid and edema tracking from the right IJ into the soft tissues along the course of the catheter. No drainable fluid collection or abscess. 2. Probable small clot within the lumen of the IJ at the insertion of the catheter.   I have spent more than 35 minutes for this patient encounter including review of prior medical records, coordination of care  with greater than 50% of time being face to face/counseling and discussing diagnostics/treatment plan with the patient/family.  Electronically signed by:   Rosiland Oz, MD Infectious Disease Physician [facility] Infectious Disease Pager: [phone]

## 2020-06-26 ENCOUNTER — Encounter (HOSPITAL_COMMUNITY): Payer: Self-pay | Admitting: Vascular Surgery

## 2020-06-26 DIAGNOSIS — I4891 Unspecified atrial fibrillation: Secondary | ICD-10-CM

## 2020-06-26 DIAGNOSIS — B9561 Methicillin susceptible Staphylococcus aureus infection as the cause of diseases classified elsewhere: Secondary | ICD-10-CM | POA: Diagnosis not present

## 2020-06-26 DIAGNOSIS — R7881 Bacteremia: Secondary | ICD-10-CM | POA: Diagnosis not present

## 2020-06-26 DIAGNOSIS — N186 End stage renal disease: Secondary | ICD-10-CM | POA: Diagnosis not present

## 2020-06-26 LAB — BASIC METABOLIC PANEL
Anion gap: 10 (ref 5–15)
BUN: 15 mg/dL (ref 8–23)
CO2: 27 mmol/L (ref 22–32)
Calcium: 8.2 mg/dL — ABNORMAL LOW (ref 8.9–10.3)
Chloride: 97 mmol/L — ABNORMAL LOW (ref 98–111)
Creatinine, Ser: 3.53 mg/dL — ABNORMAL HIGH (ref 0.44–1.00)
GFR, Estimated: 13 mL/min — ABNORMAL LOW (ref >60)
Glucose, Bld: 120 mg/dL — ABNORMAL HIGH (ref 70–99)
Potassium: 3.7 mmol/L (ref 3.5–5.1)
Sodium: 134 mmol/L — ABNORMAL LOW (ref 135–145)

## 2020-06-26 LAB — POCT I-STAT, CHEM 8
BUN: 43 mg/dL — ABNORMAL HIGH (ref 8–23)
Calcium, Ion: 1.25 mmol/L (ref 1.15–1.40)
Chloride: 102 mmol/L (ref 98–111)
Creatinine, Ser: 7.5 mg/dL — ABNORMAL HIGH (ref 0.44–1.00)
Glucose, Bld: 153 mg/dL — ABNORMAL HIGH (ref 70–99)
HCT: 32 % — ABNORMAL LOW (ref 36.0–46.0)
Hemoglobin: 10.9 g/dL — ABNORMAL LOW (ref 12.0–15.0)
Potassium: 4.4 mmol/L (ref 3.5–5.1)
Sodium: 135 mmol/L (ref 135–145)
TCO2: 23 mmol/L (ref 22–32)

## 2020-06-26 LAB — CBC
HCT: 29.5 % — ABNORMAL LOW (ref 36.0–46.0)
Hemoglobin: 9.1 g/dL — ABNORMAL LOW (ref 12.0–15.0)
MCH: 30.2 pg (ref 26.0–34.0)
MCHC: 30.8 g/dL (ref 30.0–36.0)
MCV: 98 fL (ref 80.0–100.0)
Platelets: 218 10*3/uL (ref 150–400)
RBC: 3.01 MIL/uL — ABNORMAL LOW (ref 3.87–5.11)
RDW: 15.6 % — ABNORMAL HIGH (ref 11.5–15.5)
WBC: 6.7 10*3/uL (ref 4.0–10.5)
nRBC: 0 % (ref 0.0–0.2)

## 2020-06-26 LAB — GLUCOSE, CAPILLARY
Glucose-Capillary: 131 mg/dL — ABNORMAL HIGH (ref 70–99)
Glucose-Capillary: 188 mg/dL — ABNORMAL HIGH (ref 70–99)
Glucose-Capillary: 205 mg/dL — ABNORMAL HIGH (ref 70–99)

## 2020-06-26 MED ORDER — CHLORHEXIDINE GLUCONATE CLOTH 2 % EX PADS: 6.0000 | MEDICATED_PAD | Freq: Every day | CUTANEOUS | Status: DC

## 2020-06-26 MED ORDER — HEPARIN SODIUM (PORCINE) 5000 UNIT/ML IJ SOLN
5000.0000 [IU] | Freq: Three times a day (TID) | INTRAMUSCULAR | Status: DC
  Administered 2020-06-26 – 2020-06-28 (×7): 5000 [IU] via SUBCUTANEOUS
  Filled 2020-06-26 (×8): qty 1

## 2020-06-26 NOTE — Progress Notes (Signed)
Physical Therapy Treatment Patient Details Name: [name] MRN: [id number] DOB: [birth year] Today's Date: 06/26/2020    History of Present Illness 80yo female admitted 06/20/20 who recently had a R IJ HD cath placed on 06/14/20 secondary to occlusion of L femoral HD catheter; she then had a fall on 06/15/20. Negative for fractures, CTH clear. Found to be in severe sepsis with cellulitis around R IJ cath. PMH anxiety, A-fib, bell's palsy, pelvic CA, CHF, CKD, hx covid, DM, ESRD on HD, gout, HLD, HTN, DM with neuropathy, syncope, cervical surgery, trigger finger release    PT Comments    Patient received in recliner, very pleasant and cooperative today. Has improved significantly since initial PT eval, and was able to mobilize on a supervision to min guard level basis with rollator today, although she did need VC for locking/unlocking brakes on 4WW with transfers. Gait distance limited by R knee pain, and she reports that she really does not tend to leave home besides going to OP HD. Left up in recliner with all needs met this morning- will certainly benefit from HHPT f/u at DC, no longer in need of SNF at this point.     Follow Up Recommendations  Home health PT;Supervision - Intermittent     Equipment Recommendations  None recommended by PT    Recommendations for Other Services       Precautions / Restrictions Precautions Precautions: Fall;Other (comment) Precaution Comments: frequent falls, chronic femoral cath/new R IJ cath, on O2 at baseline, claustrophobic Restrictions Weight Bearing Restrictions: No    Mobility  Bed Mobility               General bed mobility comments: pt in chair    Transfers Overall transfer level: Needs assistance Equipment used: Rolling walker (2 wheeled) Transfers: Sit to/from Stand Sit to Stand: Supervision         General transfer comment: supervision for safety and to lock brakes  Ambulation/Gait Ambulation/Gait assistance: Min  guard Gait Distance (Feet): 60 Feet Assistive device: 4-wheeled walker Gait Pattern/deviations: Step-through pattern;Decreased step length - right;Decreased step length - left;Trunk flexed Gait velocity: decreased   General Gait Details: slow and steady with personal rollator- does fatigue easily, and gait distance somewhat limited by R knee pain   Stairs             Wheelchair Mobility    Modified Rankin (Stroke Patients Only)       Balance Overall balance assessment: Needs assistance   Sitting balance-Leahy Scale: Fair       Standing balance-Leahy Scale: Poor Standing balance comment: reliant on rollator                            Cognition Arousal/Alertness: Awake/alert Behavior During Therapy: WFL for tasks assessed/performed Overall Cognitive Status: Within Functional Limits for tasks assessed                                 General Comments: seems much more herself today- pleasant, interactive and motivated to work with therapies      Exercises      General Comments        Pertinent Vitals/Pain Pain Assessment: Faces Faces Pain Scale: Hurts a little bit Pain Location: R knee Pain Descriptors / Indicators: Discomfort Pain Intervention(s): Limited activity within patient's tolerance;Monitored during session    Home Living  Prior Function            PT Goals (current goals can now be found in the care plan section) Acute Rehab PT Goals Patient Stated Goal: get more mobile and back to independence PT Goal Formulation: With family Time For Goal Achievement: 07/05/20 Potential to Achieve Goals: Fair Progress towards PT goals: Progressing toward goals    Frequency    Min 3X/week      PT Plan Frequency needs to be updated;Equipment recommendations need to be updated;Discharge plan needs to be updated    Co-evaluation              AM-PAC PT "6 Clicks" Mobility   Outcome  Measure  Help needed turning from your back to your side while in a flat bed without using bedrails?: A Little Help needed moving from lying on your back to sitting on the side of a flat bed without using bedrails?: A Little Help needed moving to and from a bed to a chair (including a wheelchair)?: A Little Help needed standing up from a chair using your arms (e.g., wheelchair or bedside chair)?: A Little Help needed to walk in hospital room?: A Little Help needed climbing 3-5 steps with a railing? : A Lot 6 Click Score: 17    End of Session   Activity Tolerance: Patient tolerated treatment well Patient left: in chair;with call bell/phone within reach Nurse Communication: Mobility status PT Visit Diagnosis: Unsteadiness on feet (R26.81);Muscle weakness (generalized) (M62.81);Repeated falls (R29.6);Difficulty in walking, not elsewhere classified (R26.2)     Time: 3267-1245 PT Time Calculation (min) (ACUTE ONLY): 23 min  Charges:  $Gait Training: 8-22 mins (co-tx with OT)                     Windell Norfolk, DPT, PN1   Supplemental Physical Therapist [place]    Pager [phone] Acute Rehab Office [phone]

## 2020-06-26 NOTE — Progress Notes (Signed)
[facility] Progress Note   Subjective:   Pt had HD last night, BP soft only allowing 1L UF but pt feels her edema is much better. TDC site is still sore. No SOB, CP, palpitations, dizziness, abdominal pain or nausea.   Objective Vitals:   06/25/20 2330 06/26/20 0000 06/26/20 0031 06/26/20 0425  BP: (!) 86/55 (!) 89/52 (!) 93/51 (!) 115/59  Pulse: (!) 58 (!) 56 (!) 57 (!) 59  Resp:   17 18  Temp:   98 F (36.7 C) 97.7 F (36.5 C)  TempSrc:   Oral Oral  SpO2:   99% 98%  Weight:   96.6 kg   Height:       Physical Exam General:Well developed female, alert and in NAD Heart:RRR, no murmurs, rubs or gallops Lungs:CTA bilaterally without wheezing, rhonchi or rales Abdomen:Soft, non-distended, +BS, + ostomy, mild diffuse TTP Extremities:trace edema bilateral lower extremities Dialysis Access:New L IJ TDC, bruising near insertion site but no bleeding/drainage  Additional Objective Labs: Basic Metabolic Panel: Recent Labs  Lab 06/22/20 0022 06/23/20 0111 06/24/20 0028 06/25/20 0027 06/25/20 1025 06/26/20 0156  NA  --    < > 133* 132* 135 134*  K  --    < > 4.3 4.1 4.4 3.7  CL  --    < > 99 97* 102 97*  CO2  --    < > 24 23  --  27  GLUCOSE  --    < > 170* 151* 153* 120*  BUN  --    < > 37* 45* 43* 15  CREATININE  --    < > 6.99* 7.26* 7.50* 3.53*  CALCIUM  --    < > 8.7* 8.7*  --  8.2*  PHOS 4.5  --   --   --   --   --    < > = values in this interval not displayed.   Liver Function Tests: Recent Labs  Lab 06/20/20 1649  AST 30  ALT 17  ALKPHOS 121  BILITOT 1.1  PROT 6.6  ALBUMIN 2.9*   CBC: Recent Labs  Lab 06/20/20 1649 06/21/20 0226 06/23/20 0111 06/25/20 1025 06/26/20 0156  WBC 13.1* 13.5* 7.2  --  6.7  NEUTROABS 11.1*  --  4.6  --   --   HGB 11.5* 10.1* 9.8* 10.9* 9.1*  HCT 37.4 33.3* 31.2* 32.0* 29.5*  MCV 100.0 101.5* 98.4  --  98.0  PLT 230 203 226  --  218   Blood Culture    Component Value Date/Time   SDES BLOOD  RIGHT ARM 06/23/2020 1305   SPECREQUEST  06/23/2020 1305    BOTTLES DRAWN AEROBIC ONLY Blood Culture results may not be optimal due to an inadequate volume of blood received in culture bottles   CULT  06/23/2020 1305    NO GROWTH 3 DAYS Performed at [facility] [address], [place], [place] [address]    REPTSTATUS PENDING 06/23/2020 1305    Cardiac Enzymes: Recent Labs  Lab 06/21/20 0226  CKTOTAL 18*   CBG: Recent Labs  Lab 06/25/20 0922 06/25/20 1154 06/25/20 1706 06/25/20 1948 06/26/20 0736  GLUCAP 135* 162* 131* 176* 131*    Studies/Results: DG Chest Port 1 View  Result Date: 06/25/2020 CLINICAL DATA:  Dialysis catheter placement EXAM: PORTABLE CHEST 1 VIEW COMPARISON:  06/20/2020 FINDINGS: Right catheter has been removed. New left IJ approach dialysis catheter tip overlies the superior right atrium. No pneumothorax. Left mid lung  atelectasis/scarring. Similar cardiomegaly. IMPRESSION: New left IJ approach dialysis catheter tip overlies superior right atrium. No pneumothorax. Electronically Signed   By: Macy Mis M.D.   On: 06/25/2020 12:30   DG Fluoro Guide CV Line-No Report  Result Date: 06/25/2020 Fluoroscopy was utilized by the requesting physician.  No radiographic interpretation.   Medications: .  ceFAZolin (ANCEF) IV 1 g (06/26/20 0110)   . amiodarone  200 mg Oral Daily  . Chlorhexidine Gluconate Cloth  6 each Topical Daily  . Chlorhexidine Gluconate Cloth  6 each Topical Q0600  . clonazePAM  1 mg Oral Daily  . estradiol  1 mg Oral Daily  . famotidine  20 mg Oral Q2000  . heparin sodium (porcine)      . insulin aspart  0-5 Units Subcutaneous QHS  . insulin aspart  0-6 Units Subcutaneous TID WC  . levothyroxine  175 mcg Oral QAC breakfast  . simvastatin  10 mg Oral QHS   OutpatientDialysis Orders: [facility] 3.5hrsTTS EDW98 kg, 2K, 2 CA bath,Heparin none. Accessdid have a right IJ PermCath(now removed)recent clotted left femoral  AVGG Calcitriol 0.7 75mcg p.o./HD Mircera60MCG every 2 weeks last given 06/14/20  Assessment/Plan: 1. MSSA bacteremia(etiology thought to be a right IJ PermCath)= ID consulted, currently on Ancef;HD PermCath removed 5/27.BCx from 5/27 still positive but redrawn 5/29 as pt is clinically much improved- negative to date. New TDC placed 5/31.Appreciate ID and VVS  2. ESRD -HD TTS,s/p line holiday, now back on regular schedule. Next HD Thursday.  3. Hypertension/volume -originally hypotensive in ER given IV fluids. Currently BP stable, no excess volume on chest x-ray,edema improved. Under EDW and not tolerating much UF with HD, UF goal 1L tomorrow 4. Anemia -Hgb9.1, not due for ESA dose yet 5. Metabolic bone disease -corrected calciumand phos controlled. Continue VDRA and binders 6. Proximal A. Fib-currently sinus rhythm Eliquis on hold on amiodarone plan per admit 7. History of chronic respiratory failure-currently no changes in oxygen requirements,no shortness of breath 8. Diabetes mellitus type 2 per admit 9. Debility and recent falls-PT OT consulting  Allison Paganini, PA-C 06/26/2020, 10:21 AM  [facility] Pager: [phone]

## 2020-06-26 NOTE — Progress Notes (Signed)
[facility] Infectious Diseases Follow Up Note  Patient Identification: Patient Name: [name] MRN: [id number] [place] Date: 06/20/2020  4:16 PM Age: 80 y.o.Today's Date: 06/26/2020   Reason for Visit: MSSA bacteremia  Principal Problem:   MSSA bacteremia Active Problems:   Hypothyroidism   Hyperlipidemia   Atrial fibrillation ([employer])   ESRD (end stage renal disease) (HCC)  Antibiotics: cefazolin - current                     Total days of antibiotics 7  Lines/Tubes:  Left thigh  AVG , colostomy RLQ, PIVs   Interval Events: Afebrile, no leukocytosis, hemodynamically stable, right IJ hemodialysis catheter placed yesterday.  TEE is pending   Assessment High-grade MSSA bacteremia/CLABSI of right IJ hemodialysis catheter (removed on 5/27).  Blood culture prior to CVC removed on 5/27 positive for staph aureus Repeat blood culture 5/29 no growth in 3 days left thigh AVG TTE TTE negative for vegetations TEE is pending  Right neck cellulitis-clinically resolved Complex fluid and edema tracking from the right IJ into the soft tissues along the course of the catheter (no drainable fluid collection or abscess) Probable small clot within the lumen of the IJ at the insertion of the catheter  ESRD on hemodialysis-new left IJ hemodialysis catheter, no issues with AV graft at left  thigh ( has some bruise )  Recommendations Continue cefazolin as is TEE is pending, duration of antibiotics pending TEE results Monitor CBC and BMP  Rest of the management as per the primary team. Thank you for the consult. Please page with pertinent questions or concerns. _____________________________________________________________________ Subjective patient seen and examined at the bedside.  Sitting up in chair.  Feels well.  Denies any pain in the neck.  Denies any fever chills and sweats.  Denies nausea vomiting and diarrhea  Vitals BP (!) 115/59  (BP Location: Right Arm)   Pulse (!) 59   Temp 97.7 F (36.5 C) (Oral)   Resp 18   Ht 5\' 3"  (1.6 m)   Wt 96.6 kg   SpO2 98%   BMI 37.72 kg/m     Physical Exam Constitutional:   Obese elderly female sitting up in chair    Comments: Not in acute distress, redness and tenderness in the right-sided neck has resolved  Cardiovascular:     Rate and Rhythm: Normal rate and regular rhythm.     Heart sounds:   Pulmonary:     Effort: Pulmonary effort is normal.     Comments: No respiratory distress  Abdominal:     Palpations: Abdomen is soft.     Tenderness: Large abdomen, ostomy with greenish liquid  Musculoskeletal:        General: No swelling or tenderness.   Skin:    Comments: No lesions or rashes.  Left AV graft site nontender nondraining nonerythematous, has bruise  Neurological:     General: No focal deficit present.   Psychiatric:        Mood and Affect: Mood normal.     Pertinent Microbiology Results for orders placed or performed during the hospital encounter of 06/20/20  Blood culture (routine x 2)     Status: Abnormal   Collection Time: 06/20/20  4:55 PM   Specimen: BLOOD RIGHT HAND  Result Value Ref Range Status   Specimen Description BLOOD RIGHT HAND  Final   Special Requests   Final    BOTTLES DRAWN AEROBIC AND ANAEROBIC Blood Culture results may not be optimal due  to an inadequate volume of blood received in culture bottles   Culture  Setup Time   Final    GRAM POSITIVE COCCI IN CLUSTERS IN BOTH AEROBIC AND ANAEROBIC BOTTLES CRITICAL VALUE NOTED.  VALUE IS CONSISTENT WITH PREVIOUSLY REPORTED AND CALLED VALUE.    Culture (A)  Final    STAPHYLOCOCCUS AUREUS SUSCEPTIBILITIES PERFORMED ON PREVIOUS CULTURE WITHIN THE LAST 5 DAYS. Performed at [facility], [place] [address], [place], [place] [address]    Report Status 06/23/2020 FINAL  Final  Resp Panel by RT-PCR (Flu A&B, Covid) Nasopharyngeal Swab     Status: None   Collection Time: 06/20/20  5:51  PM   Specimen: Nasopharyngeal Swab; Nasopharyngeal(NP) swabs in vial transport medium  Result Value Ref Range Status   SARS Coronavirus 2 by RT PCR NEGATIVE NEGATIVE Final    Comment: (NOTE) SARS-CoV-2 target nucleic acids are NOT DETECTED.  The SARS-CoV-2 RNA is generally detectable in upper respiratory specimens during the acute phase of infection. The lowest concentration of SARS-CoV-2 viral copies this assay can detect is 138 copies/mL. A negative result does not preclude SARS-Cov-2 infection and should not be used as the sole basis for treatment or other patient management decisions. A negative result may occur with  improper specimen collection/handling, submission of specimen other than nasopharyngeal swab, presence of viral mutation(s) within the areas targeted by this assay, and inadequate number of viral copies(<138 copies/mL). A negative result must be combined with clinical observations, patient history, and epidemiological information. The expected result is Negative.  Fact Sheet for Patients:  EntrepreneurPulse.com.au  Fact Sheet for Healthcare Providers:  IncredibleEmployment.be  This test is no t yet approved or cleared by the [place] FDA and  has been authorized for detection and/or diagnosis of SARS-CoV-2 by FDA under an Emergency Use Authorization (EUA). This EUA will remain  in effect (meaning this test can be used) for the duration of the COVID-19 declaration under Section 564(b)(1) of the Act, 21 U.S.C.section 360bbb-3(b)(1), unless the authorization is terminated  or revoked sooner.       Influenza A by PCR NEGATIVE NEGATIVE Final   Influenza B by PCR NEGATIVE NEGATIVE Final    Comment: (NOTE) The Xpert Xpress SARS-CoV-2/FLU/RSV plus assay is intended as an aid in the diagnosis of influenza from Nasopharyngeal swab specimens and should not be used as a sole basis for treatment. Nasal washings and aspirates are  unacceptable for Xpert Xpress SARS-CoV-2/FLU/RSV testing.  Fact Sheet for Patients: EntrepreneurPulse.com.au  Fact Sheet for Healthcare Providers: IncredibleEmployment.be  This test is not yet approved or cleared by the [place] FDA and has been authorized for detection and/or diagnosis of SARS-CoV-2 by FDA under an Emergency Use Authorization (EUA). This EUA will remain in effect (meaning this test can be used) for the duration of the COVID-19 declaration under Section 564(b)(1) of the Act, 21 U.S.C. section 360bbb-3(b)(1), unless the authorization is terminated or revoked.  Performed at [facility], [place] [address] [place], [place] [address]   Blood culture (routine x 2)     Status: Abnormal   Collection Time: 06/20/20  6:25 PM   Specimen: Right Antecubital; Blood  Result Value Ref Range Status   Specimen Description RIGHT ANTECUBITAL  Final   Special Requests   Final    BOTTLES DRAWN AEROBIC ONLY Blood Culture adequate volume   Culture  Setup Time   Final    GRAM POSITIVE COCCI IN CLUSTERS AEROBIC BOTTLE ONLY CRITICAL RESULT CALLED TO, READ  BACK BY AND VERIFIED WITH: PHARMD M.YATES AT 6644 ON 06/21/2020 BY T.SAAD. Performed at [facility], [place] [address] [place], [place] [address]    Culture STAPHYLOCOCCUS AUREUS (A)  Final   Report Status 06/23/2020 FINAL  Final   Organism ID, Bacteria STAPHYLOCOCCUS AUREUS  Final      Susceptibility   Staphylococcus aureus - MIC*    CIPROFLOXACIN <=0.5 SENSITIVE Sensitive     ERYTHROMYCIN RESISTANT Resistant     GENTAMICIN <=0.5 SENSITIVE Sensitive     OXACILLIN 0.5 SENSITIVE Sensitive     TETRACYCLINE <=1 SENSITIVE Sensitive     VANCOMYCIN <=0.5 SENSITIVE Sensitive     TRIMETH/SULFA <=10 SENSITIVE Sensitive     CLINDAMYCIN RESISTANT Resistant     RIFAMPIN <=0.5 SENSITIVE Sensitive     Inducible Clindamycin POSITIVE Resistant     * STAPHYLOCOCCUS AUREUS  Blood Culture ID  Panel (Reflexed)     Status: Abnormal   Collection Time: 06/20/20  6:25 PM  Result Value Ref Range Status   Enterococcus faecalis NOT DETECTED NOT DETECTED Final   Enterococcus Faecium NOT DETECTED NOT DETECTED Final   Listeria monocytogenes NOT DETECTED NOT DETECTED Final   Staphylococcus species DETECTED (A) NOT DETECTED Final   Staphylococcus aureus (BCID) DETECTED (A) NOT DETECTED Final    Comment: CRITICAL RESULT CALLED TO, READ BACK BY AND VERIFIED WITH: M. PHAM PHARMD AT 1915 06/21/20 BY D. VANHOOK    Staphylococcus epidermidis NOT DETECTED NOT DETECTED Final   Staphylococcus lugdunensis NOT DETECTED NOT DETECTED Final   Streptococcus species NOT DETECTED NOT DETECTED Final   Streptococcus agalactiae NOT DETECTED NOT DETECTED Final   Streptococcus pneumoniae NOT DETECTED NOT DETECTED Final   Streptococcus pyogenes NOT DETECTED NOT DETECTED Final   A.calcoaceticus-baumannii NOT DETECTED NOT DETECTED Final   Bacteroides fragilis NOT DETECTED NOT DETECTED Final   Enterobacterales NOT DETECTED NOT DETECTED Final   Enterobacter cloacae complex NOT DETECTED NOT DETECTED Final   Escherichia coli NOT DETECTED NOT DETECTED Final   Klebsiella aerogenes NOT DETECTED NOT DETECTED Final   Klebsiella oxytoca NOT DETECTED NOT DETECTED Final   Klebsiella pneumoniae NOT DETECTED NOT DETECTED Final   Proteus species NOT DETECTED NOT DETECTED Final   Salmonella species NOT DETECTED NOT DETECTED Final   Serratia marcescens NOT DETECTED NOT DETECTED Final   Haemophilus influenzae NOT DETECTED NOT DETECTED Final   Neisseria meningitidis NOT DETECTED NOT DETECTED Final   Pseudomonas aeruginosa NOT DETECTED NOT DETECTED Final   Stenotrophomonas maltophilia NOT DETECTED NOT DETECTED Final   Candida albicans NOT DETECTED NOT DETECTED Final   Candida auris NOT DETECTED NOT DETECTED Final   Candida glabrata NOT DETECTED NOT DETECTED Final   Candida krusei NOT DETECTED NOT DETECTED Final   Candida  parapsilosis NOT DETECTED NOT DETECTED Final   Candida tropicalis NOT DETECTED NOT DETECTED Final   Cryptococcus neoformans/gattii NOT DETECTED NOT DETECTED Final   Meth resistant mecA/C and MREJ NOT DETECTED NOT DETECTED Final    Comment: Performed at [facility], [address] [place], [place] [address]  Culture, blood (routine x 2)     Status: Abnormal   Collection Time: 06/21/20  3:01 PM   Specimen: BLOOD  Result Value Ref Range Status   Specimen Description BLOOD LEFT ANTECUBITAL  Final   Special Requests   Final    BOTTLES DRAWN AEROBIC ONLY Blood Culture results may not be optimal due to an inadequate volume of blood received in culture bottles   Culture  Setup Time   Final    GRAM POSITIVE COCCI IN CLUSTERS AEROBIC BOTTLE ONLY CRITICAL VALUE NOTED.  VALUE IS CONSISTENT WITH PREVIOUSLY REPORTED AND CALLED VALUE.    Culture (A)  Final    STAPHYLOCOCCUS AUREUS SUSCEPTIBILITIES PERFORMED ON PREVIOUS CULTURE WITHIN THE LAST 5 DAYS. Performed at [facility], [place] [address] [place], [place] [address]    Report Status 06/24/2020 FINAL  Final  Culture, blood (routine x 2)     Status: Abnormal   Collection Time: 06/21/20  3:10 PM   Specimen: BLOOD RIGHT HAND  Result Value Ref Range Status   Specimen Description BLOOD RIGHT HAND  Final   Special Requests   Final    BOTTLES DRAWN AEROBIC ONLY Blood Culture results may not be optimal due to an inadequate volume of blood received in culture bottles   Culture  Setup Time   Final    GRAM POSITIVE COCCI IN CLUSTERS AEROBIC BOTTLE ONLY CRITICAL VALUE NOTED.  VALUE IS CONSISTENT WITH PREVIOUSLY REPORTED AND CALLED VALUE.    Culture (A)  Final    STAPHYLOCOCCUS AUREUS SUSCEPTIBILITIES PERFORMED ON PREVIOUS CULTURE WITHIN THE LAST 5 DAYS. Performed at [facility], [place] [address], [place], [place] [address]    Report Status 06/24/2020 FINAL  Final  Culture, blood (Routine X 2) w Reflex to ID Panel     Status: None  (Preliminary result)   Collection Time: 06/23/20 12:57 PM   Specimen: BLOOD RIGHT ARM  Result Value Ref Range Status   Specimen Description BLOOD RIGHT ARM  Final   Special Requests   Final    BOTTLES DRAWN AEROBIC ONLY Blood Culture results may not be optimal due to an inadequate volume of blood received in culture bottles   Culture   Final    NO GROWTH 3 DAYS Performed at [facility], [place] [address] [place], [place] [address]    Report Status PENDING  Incomplete  Culture, blood (Routine X 2) w Reflex to ID Panel     Status: None (Preliminary result)   Collection Time: 06/23/20  1:05 PM   Specimen: BLOOD RIGHT ARM  Result Value Ref Range Status   Specimen Description BLOOD RIGHT ARM  Final   Special Requests   Final    BOTTLES DRAWN AEROBIC ONLY Blood Culture results may not be optimal due to an inadequate volume of blood received in culture bottles   Culture   Final    NO GROWTH 3 DAYS Performed at [facility], [place] [address], [place], [place] [address]    Report Status PENDING  Incomplete     Pertinent Lab. CBC Latest Ref Rng & Units 06/26/2020 06/25/2020 06/23/2020  WBC 4.0 - 10.5 K/uL 6.7 - 7.2  Hemoglobin 12.0 - 15.0 g/dL 9.1(L) 10.9(L) 9.8(L)  Hematocrit 36.0 - 46.0 % 29.5(L) 32.0(L) 31.2(L)  Platelets 150 - 400 K/uL 218 - 226   CMP Latest Ref Rng & Units 06/26/2020 06/25/2020 06/25/2020  Glucose 70 - 99 mg/dL 120(H) 153(H) 151(H)  BUN 8 - 23 mg/dL 15 43(H) 45(H)  Creatinine 0.44 - 1.00 mg/dL 3.53(H) 7.50(H) 7.26(H)  Sodium 135 - 145 mmol/L 134(L) 135 132(L)  Potassium 3.5 - 5.1 mmol/L 3.7 4.4 4.1  Chloride 98 - 111 mmol/L 97(L) 102 97(L)  CO2 22 - 32 mmol/L 27 - 23  Calcium 8.9 - 10.3 mg/dL 8.2(L) - 8.7(L)  Total Protein 6.5 - 8.1 g/dL - - -  Total Bilirubin 0.3 -  1.2 mg/dL - - -  Alkaline Phos 38 - 126 U/L - - -  AST 15 - 41 U/L - - -  ALT 0 - 44 U/L - - -     Pertinent Imaging today Plain films and CT images have been personally visualized and  interpreted; radiology reports have been reviewed. Decision making incorporated into the Impression / Recommendations.  I have spent more than 35 minutes for this patient encounter including review of prior medical records, coordination of care  with greater than 50% of time being face to face/counseling and discussing diagnostics/treatment plan with the patient/family.  Electronically signed by:   Rosiland Oz, MD Infectious Disease Physician [facility] for Infectious Disease Pager: [phone]

## 2020-06-26 NOTE — Progress Notes (Signed)
Occupational Therapy Treatment Patient Details Name: [name] MRN: [id number] DOB: [birth year] Today's Date: 06/26/2020    History of present illness 80yo female admitted 06/20/20 who recently had a R IJ HD cath placed on 06/14/20 secondary to occlusion of L femoral HD catheter; she then had a fall on 06/15/20. Negative for fractures, CTH clear. Found to be in severe sepsis with cellulitis around R IJ cath. PMH anxiety, A-fib, bell's palsy, pelvic CA, CHF, CKD, hx covid, DM, ESRD on HD, gout, HLD, HTN, DM with neuropathy, syncope, cervical surgery, trigger finger release   OT comments  Pt has progressed markedly from evaluation. Donned gown with set up and stood at sink for one grooming activity Ambulating in hall with her rollator and supervision. Cognition appears to have returned to baseline. She reports managing her ostomy with very little nursing assistance. Updated d/c recommendation.  Follow Up Recommendations  No OT follow up;Supervision/Assistance - 24 hour    Equipment Recommendations  None recommended by OT    Recommendations for Other Services      Precautions / Restrictions Precautions Precautions: Fall;Other (comment) Precaution Comments: frequent falls, chronic femoral cath/new R IJ cath, on O2 at baseline, claustrophobic Restrictions Weight Bearing Restrictions: No       Mobility Bed Mobility               General bed mobility comments: pt in chair    Transfers Overall transfer level: Needs assistance Equipment used: Rolling walker (2 wheeled) Transfers: Sit to/from Stand Sit to Stand: Supervision         General transfer comment: supervision for safety and to lock brakes    Balance Overall balance assessment: Needs assistance   Sitting balance-Leahy Scale: Fair       Standing balance-Leahy Scale: Poor Standing balance comment: reliant on rollator                           ADL either performed or assessed with clinical  judgement   ADL Overall ADL's : Needs assistance/impaired     Grooming: Brushing hair;Standing;Supervision/safety           Upper Body Dressing : Set up;Sitting Upper Body Dressing Details (indicate cue type and reason): front opening gown         Toileting- Clothing Manipulation and Hygiene: Minimal assistance Toileting - Clothing Manipulation Details (indicate cue type and reason): reports she is managing her ostomy     Functional mobility during ADLs: Supervision/safety;Rolling walker       Vision       Perception     Praxis      Cognition Arousal/Alertness: Awake/alert Behavior During Therapy: WFL for tasks assessed/performed Overall Cognitive Status: Within Functional Limits for tasks assessed                                          Exercises     Shoulder Instructions       General Comments      Pertinent Vitals/ Pain       Pain Assessment: Faces Faces Pain Scale: Hurts a little bit Pain Location: R knee Pain Descriptors / Indicators: Discomfort Pain Intervention(s): Monitored during session  Home Living  Prior Functioning/Environment              Frequency  Min 2X/week        Progress Toward Goals  OT Goals(current goals can now be found in the care plan section)  Progress towards OT goals: Progressing toward goals  Acute Rehab OT Goals Patient Stated Goal: get more mobile and back to independence OT Goal Formulation: With patient/family Time For Goal Achievement: 07/05/20 Potential to Achieve Goals: Good  Plan Discharge plan needs to be updated    Co-evaluation                 AM-PAC OT "6 Clicks" Daily Activity     Outcome Measure   Help from another person eating meals?: None Help from another person taking care of personal grooming?: A Little Help from another person toileting, which includes using toliet, bedpan, or urinal?: A  Little Help from another person bathing (including washing, rinsing, drying)?: A Lot Help from another person to put on and taking off regular upper body clothing?: A Little Help from another person to put on and taking off regular lower body clothing?: A Lot 6 Click Score: 17    End of Session Equipment Utilized During Treatment: Rolling walker  OT Visit Diagnosis: Muscle weakness (generalized) (M62.81)   Activity Tolerance Patient tolerated treatment well   Patient Left in chair;with call bell/phone within reach   Nurse Communication          Time: 3710-6269 OT Time Calculation (min): 23 min  Charges: OT General Charges $OT Visit: 1 Visit OT Treatments $Self Care/Home Management : 8-22 mins  Nestor Lewandowsky, OTR/L Acute Rehabilitation Services Pager: [phone] Office: [phone]  [name] 06/26/2020, 11:52 AM

## 2020-06-26 NOTE — Progress Notes (Signed)
PROGRESS NOTE    KEELY DRENNAN  CWU:[id number] DOB: February 05, 1940 DOA: 06/20/2020 PCP: Wannetta Sender, FNP    Brief Narrative:  80 year old female with extensive medical issues, ESRD on hemodialysis TTS schedule, paroxysmal A. fib on Eliquis, chronic respiratory failure on home oxygen, obesity, coronary artery disease, combined chronic heart failure, type 2 diabetes, hypertension, hyperlipidemia, hypothyroidism, diverticulosis and multiple comorbidities recently underwent tunneled IJ catheter placement on 5/20 due to occlusion of left femoral dialysis catheter.  She has frequent fall.  Also fell on 5/21.  Was complaining of right neck pain after catheter placement.  Continue to complain of back pain and right hip pain so came to ER. In the emergency room febrile and tachycardic on arrival.  Had received dialysis on 5/26 the day of admission.  COVID-19 negative.  Chest x-ray with streaky opacity left lung base.  Head CT negative.  X-ray of the right hip and pelvis without any injuries.  Started on daptomycin and ceftriaxone after blood cultures and admit to the hospital.  Right tunneled IJ catheter was obviously infected on presentation. Blood cultures with MSSA bacteremia.  Assessment & Plan:   Principal Problem:   MSSA bacteremia Active Problems:   Hypothyroidism   Hyperlipidemia   Atrial fibrillation ([place])   ESRD (end stage renal disease) ([place])  Severe sepsis secondary to MSSA bacteremia, infected indwelling tunneled dialysis catheter, POA: 5/26, blood cultures with MSSA 5/27, repeat blood cultures still growing MSSA, was drawn while catheter was a still in.   5/27, removal of right IJ permacath. 5/29, repeat blood cultures drawn, no growth so far.  Currently on cefazolin.  Followed by ID-appreciate insight and recommendations 5/31 Permacath placed after recurrent cultures remain negative greater than 48 hours. TTE with no obvious vegetation.  Cardiology consulted  for  TEE.  ESRD on HD: TTS schedule . Received HD 5/26.   Renal consulted,  Paroxysmal atrial fibrillation:  -Patient currently in sinus rhythm.  Eliquis on hold for permacath placement, likely reinitiate in the next 24 hours.  On amiodarone.  No indication for bridging.  Lynch syndrome with colon adenocarcinoma  -S/p laparoscopic right hemicolectomy 05/08/2020  Type 2 diabetes on insulin: Remains on sliding scale.  Stable.  Chronic respiratory failure: No change in oxygen requirement.  Fairly stable.  Ambulatory dysfunction, debility and frailty/frequent falls: Work with PT OT.  Lives at home with family.   DVT prophylaxis: Place and maintain sequential compression device Start: 06/20/20 2134 **Eliquis on hold for procedure as above -restart in the next 24 hours assuming no contraindications or signs of bleeding postprocedure** Code Status: Full code Family Communication: None at bedside Disposition Plan: Status is: Inpatient  Remains inpatient appropriate because:Ongoing diagnostic testing needed not appropriate for outpatient work up and Inpatient level of care appropriate due to severity of illness   Dispo: The patient is from: Home              Anticipated d/c is to: Home with home health              Patient currently is not medically stable to d/c.   Difficult to place patient No   Consultants:  Vascular surgery Nephrology Infectious disease Cardiology fot TEE  Procedures:   None  Antimicrobials:   Daptomycin and Rocephin 5/26--5/27  Cefazolin 5/28--- ongoing  Subjective: - She denies any chest pain, shortness of breath, no nausea, no vomiting, no fever or chills.   Objective: Vitals:   06/25/20 2330 06/26/20 0000 06/26/20 0031 06/26/20  0425  BP: (!) 86/55 (!) 89/52 (!) 93/51 (!) 115/59  Pulse: (!) 58 (!) 56 (!) 57 (!) 59  Resp:   17 18  Temp:   98 F (36.7 C) 97.7 F (36.5 C)  TempSrc:   Oral Oral  SpO2:   99% 98%  Weight:   96.6 kg   Height:         Intake/Output Summary (Last 24 hours) at 06/26/2020 1124 Last data filed at 06/26/2020 0925 Gross per 24 hour  Intake 440 ml  Output 1862 ml  Net -1422 ml   Filed Weights   06/25/20 1008 06/25/20 2025 06/26/20 0031  Weight: 100.2 kg 97.6 kg 96.6 kg    Examination:  Awake Alert, Oriented X 3, No new F.N deficits, Normal affect Left upper chest perma cath. Symmetrical Chest wall movement, Good air movement bilaterally, CTAB RRR,No Gallops,Rubs or new Murmurs, No Parasternal Heave +ve B.Sounds, Abd Soft, colostomy, No tenderness, No rebound - guarding or rigidity. No Cyanosis, Clubbing +1  edema, No new Rash or bruise     Data Reviewed: I have personally reviewed following labs and imaging studies  CBC: Recent Labs  Lab 06/20/20 1649 06/21/20 0226 06/23/20 0111 06/25/20 1025 06/26/20 0156  WBC 13.1* 13.5* 7.2  --  6.7  NEUTROABS 11.1*  --  4.6  --   --   HGB 11.5* 10.1* 9.8* 10.9* 9.1*  HCT 37.4 33.3* 31.2* 32.0* 29.5*  MCV 100.0 101.5* 98.4  --  98.0  PLT 230 203 226  --  938   Basic Metabolic Panel: Recent Labs  Lab 06/21/20 0226 06/22/20 0022 06/23/20 0111 06/24/20 0028 06/25/20 0027 06/25/20 1025 06/26/20 0156  NA 138  --  133* 133* 132* 135 134*  K 3.9  --  3.9 4.3 4.1 4.4 3.7  CL 99  --  98 99 97* 102 97*  CO2 30  --  26 24 23   --  27  GLUCOSE 101*  --  144* 170* 151* 153* 120*  BUN 10  --  31* 37* 45* 43* 15  CREATININE 4.08*  --  6.26* 6.99* 7.26* 7.50* 3.53*  CALCIUM 8.1*  --  8.7* 8.7* 8.7*  --  8.2*  PHOS  --  4.5  --   --   --   --   --    GFR: Estimated Creatinine Clearance: 14.8 mL/min (A) (by C-G formula based on SCr of 3.53 mg/dL (H)). Liver Function Tests: Recent Labs  Lab 06/20/20 1649  AST 30  ALT 17  ALKPHOS 121  BILITOT 1.1  PROT 6.6  ALBUMIN 2.9*   No results for input(s): LIPASE, AMYLASE in the last 168 hours. No results for input(s): AMMONIA in the last 168 hours. Coagulation Profile: Recent Labs  Lab 06/20/20 1649   INR 1.6*   Cardiac Enzymes: Recent Labs  Lab 06/21/20 0226  CKTOTAL 18*   BNP (last 3 results) No results for input(s): PROBNP in the last 8760 hours. HbA1C: No results for input(s): HGBA1C in the last 72 hours. CBG: Recent Labs  Lab 06/25/20 0922 06/25/20 1154 06/25/20 1706 06/25/20 1948 06/26/20 0736  GLUCAP 135* 162* 131* 176* 131*   Lipid Profile: No results for input(s): CHOL, HDL, LDLCALC, TRIG, CHOLHDL, LDLDIRECT in the last 72 hours. Thyroid Function Tests: No results for input(s): TSH, T4TOTAL, FREET4, T3FREE, THYROIDAB in the last 72 hours. Anemia Panel: No results for input(s): VITAMINB12, FOLATE, FERRITIN, TIBC, IRON, RETICCTPCT in the last 72 hours. Sepsis Labs: Recent  Labs  Lab 06/20/20 2209 06/21/20 0226 06/21/20 0714 06/21/20 1024  LATICACIDVEN 3.7* 2.4* 1.8 1.7    Recent Results (from the past 240 hour(s))  Blood culture (routine x 2)     Status: Abnormal   Collection Time: 06/20/20  4:55 PM   Specimen: BLOOD RIGHT HAND  Result Value Ref Range Status   Specimen Description BLOOD RIGHT HAND  Final   Special Requests   Final    BOTTLES DRAWN AEROBIC AND ANAEROBIC Blood Culture results may not be optimal due to an inadequate volume of blood received in culture bottles   Culture  Setup Time   Final    GRAM POSITIVE COCCI IN CLUSTERS IN BOTH AEROBIC AND ANAEROBIC BOTTLES CRITICAL VALUE NOTED.  VALUE IS CONSISTENT WITH PREVIOUSLY REPORTED AND CALLED VALUE.    Culture (A)  Final    STAPHYLOCOCCUS AUREUS SUSCEPTIBILITIES PERFORMED ON PREVIOUS CULTURE WITHIN THE LAST 5 DAYS. Performed at [facility], [place] [address] [place], [place] [address]    Report Status 06/23/2020 FINAL  Final  Resp Panel by RT-PCR (Flu A&B, Covid) Nasopharyngeal Swab     Status: None   Collection Time: 06/20/20  5:51 PM   Specimen: Nasopharyngeal Swab; Nasopharyngeal(NP) swabs in vial transport medium  Result Value Ref Range Status   SARS Coronavirus 2 by RT PCR  NEGATIVE NEGATIVE Final    Comment: (NOTE) SARS-CoV-2 target nucleic acids are NOT DETECTED.  The SARS-CoV-2 RNA is generally detectable in upper respiratory specimens during the acute phase of infection. The lowest concentration of SARS-CoV-2 viral copies this assay can detect is 138 copies/mL. A negative result does not preclude SARS-Cov-2 infection and should not be used as the sole basis for treatment or other patient management decisions. A negative result may occur with  improper specimen collection/handling, submission of specimen other than nasopharyngeal swab, presence of viral mutation(s) within the areas targeted by this assay, and inadequate number of viral copies(<138 copies/mL). A negative result must be combined with clinical observations, patient history, and epidemiological information. The expected result is Negative.  Fact Sheet for Patients:  EntrepreneurPulse.com.au  Fact Sheet for Healthcare Providers:  IncredibleEmployment.be  This test is no t yet approved or cleared by the [place] FDA and  has been authorized for detection and/or diagnosis of SARS-CoV-2 by FDA under an Emergency Use Authorization (EUA). This EUA will remain  in effect (meaning this test can be used) for the duration of the COVID-19 declaration under Section 564(b)(1) of the Act, 21 U.S.C.section 360bbb-3(b)(1), unless the authorization is terminated  or revoked sooner.       Influenza A by PCR NEGATIVE NEGATIVE Final   Influenza B by PCR NEGATIVE NEGATIVE Final    Comment: (NOTE) The Xpert Xpress SARS-CoV-2/FLU/RSV plus assay is intended as an aid in the diagnosis of influenza from Nasopharyngeal swab specimens and should not be used as a sole basis for treatment. Nasal washings and aspirates are unacceptable for Xpert Xpress SARS-CoV-2/FLU/RSV testing.  Fact Sheet for Patients: EntrepreneurPulse.com.au  Fact Sheet for  Healthcare Providers: IncredibleEmployment.be  This test is not yet approved or cleared by the [place] FDA and has been authorized for detection and/or diagnosis of SARS-CoV-2 by FDA under an Emergency Use Authorization (EUA). This EUA will remain in effect (meaning this test can be used) for the duration of the COVID-19 declaration under Section 564(b)(1) of the Act, 21 U.S.C. section 360bbb-3(b)(1), unless the authorization is terminated or revoked.  Performed at [facility],  [address] [place], [place] [address]   Blood culture (routine x 2)     Status: Abnormal   Collection Time: 06/20/20  6:25 PM   Specimen: Right Antecubital; Blood  Result Value Ref Range Status   Specimen Description RIGHT ANTECUBITAL  Final   Special Requests   Final    BOTTLES DRAWN AEROBIC ONLY Blood Culture adequate volume   Culture  Setup Time   Final    GRAM POSITIVE COCCI IN CLUSTERS AEROBIC BOTTLE ONLY CRITICAL RESULT CALLED TO, READ BACK BY AND VERIFIED WITH: PHARMD M.YATES AT 3149 ON 06/21/2020 BY T.SAAD. Performed at [facility], [place] [address], [place], [place] [address]    Culture STAPHYLOCOCCUS AUREUS (A)  Final   Report Status 06/23/2020 FINAL  Final   Organism ID, Bacteria STAPHYLOCOCCUS AUREUS  Final      Susceptibility   Staphylococcus aureus - MIC*    CIPROFLOXACIN <=0.5 SENSITIVE Sensitive     ERYTHROMYCIN RESISTANT Resistant     GENTAMICIN <=0.5 SENSITIVE Sensitive     OXACILLIN 0.5 SENSITIVE Sensitive     TETRACYCLINE <=1 SENSITIVE Sensitive     VANCOMYCIN <=0.5 SENSITIVE Sensitive     TRIMETH/SULFA <=10 SENSITIVE Sensitive     CLINDAMYCIN RESISTANT Resistant     RIFAMPIN <=0.5 SENSITIVE Sensitive     Inducible Clindamycin POSITIVE Resistant     * STAPHYLOCOCCUS AUREUS  Blood Culture ID Panel (Reflexed)     Status: Abnormal   Collection Time: 06/20/20  6:25 PM  Result Value Ref Range Status   Enterococcus faecalis NOT DETECTED NOT  DETECTED Final   Enterococcus Faecium NOT DETECTED NOT DETECTED Final   Listeria monocytogenes NOT DETECTED NOT DETECTED Final   Staphylococcus species DETECTED (A) NOT DETECTED Final   Staphylococcus aureus (BCID) DETECTED (A) NOT DETECTED Final    Comment: CRITICAL RESULT CALLED TO, READ BACK BY AND VERIFIED WITH: M. PHAM PHARMD AT 1915 06/21/20 BY D. VANHOOK    Staphylococcus epidermidis NOT DETECTED NOT DETECTED Final   Staphylococcus lugdunensis NOT DETECTED NOT DETECTED Final   Streptococcus species NOT DETECTED NOT DETECTED Final   Streptococcus agalactiae NOT DETECTED NOT DETECTED Final   Streptococcus pneumoniae NOT DETECTED NOT DETECTED Final   Streptococcus pyogenes NOT DETECTED NOT DETECTED Final   A.calcoaceticus-baumannii NOT DETECTED NOT DETECTED Final   Bacteroides fragilis NOT DETECTED NOT DETECTED Final   Enterobacterales NOT DETECTED NOT DETECTED Final   Enterobacter cloacae complex NOT DETECTED NOT DETECTED Final   Escherichia coli NOT DETECTED NOT DETECTED Final   Klebsiella aerogenes NOT DETECTED NOT DETECTED Final   Klebsiella oxytoca NOT DETECTED NOT DETECTED Final   Klebsiella pneumoniae NOT DETECTED NOT DETECTED Final   Proteus species NOT DETECTED NOT DETECTED Final   Salmonella species NOT DETECTED NOT DETECTED Final   Serratia marcescens NOT DETECTED NOT DETECTED Final   Haemophilus influenzae NOT DETECTED NOT DETECTED Final   Neisseria meningitidis NOT DETECTED NOT DETECTED Final   Pseudomonas aeruginosa NOT DETECTED NOT DETECTED Final   Stenotrophomonas maltophilia NOT DETECTED NOT DETECTED Final   Candida albicans NOT DETECTED NOT DETECTED Final   Candida auris NOT DETECTED NOT DETECTED Final   Candida glabrata NOT DETECTED NOT DETECTED Final   Candida krusei NOT DETECTED NOT DETECTED Final   Candida parapsilosis NOT DETECTED NOT DETECTED Final   Candida tropicalis NOT DETECTED NOT DETECTED Final   Cryptococcus neoformans/gattii NOT DETECTED NOT  DETECTED Final   Meth resistant mecA/C and MREJ NOT DETECTED NOT DETECTED Final  Comment: Performed at [facility], [place] [address], [place], [place] [address]  Culture, blood (routine x 2)     Status: Abnormal   Collection Time: 06/21/20  3:01 PM   Specimen: BLOOD  Result Value Ref Range Status   Specimen Description BLOOD LEFT ANTECUBITAL  Final   Special Requests   Final    BOTTLES DRAWN AEROBIC ONLY Blood Culture results may not be optimal due to an inadequate volume of blood received in culture bottles   Culture  Setup Time   Final    GRAM POSITIVE COCCI IN CLUSTERS AEROBIC BOTTLE ONLY CRITICAL VALUE NOTED.  VALUE IS CONSISTENT WITH PREVIOUSLY REPORTED AND CALLED VALUE.    Culture (A)  Final    STAPHYLOCOCCUS AUREUS SUSCEPTIBILITIES PERFORMED ON PREVIOUS CULTURE WITHIN THE LAST 5 DAYS. Performed at [facility], [place] [address] [place], [place] [address]    Report Status 06/24/2020 FINAL  Final  Culture, blood (routine x 2)     Status: Abnormal   Collection Time: 06/21/20  3:10 PM   Specimen: BLOOD RIGHT HAND  Result Value Ref Range Status   Specimen Description BLOOD RIGHT HAND  Final   Special Requests   Final    BOTTLES DRAWN AEROBIC ONLY Blood Culture results may not be optimal due to an inadequate volume of blood received in culture bottles   Culture  Setup Time   Final    GRAM POSITIVE COCCI IN CLUSTERS AEROBIC BOTTLE ONLY CRITICAL VALUE NOTED.  VALUE IS CONSISTENT WITH PREVIOUSLY REPORTED AND CALLED VALUE.    Culture (A)  Final    STAPHYLOCOCCUS AUREUS SUSCEPTIBILITIES PERFORMED ON PREVIOUS CULTURE WITHIN THE LAST 5 DAYS. Performed at [facility], [place] [address] [place], [place] [address]    Report Status 06/24/2020 FINAL  Final  Culture, blood (Routine X 2) w Reflex to ID Panel     Status: None (Preliminary result)   Collection Time: 06/23/20 12:57 PM   Specimen: BLOOD RIGHT ARM  Result Value Ref Range Status   Specimen Description BLOOD  RIGHT ARM  Final   Special Requests   Final    BOTTLES DRAWN AEROBIC ONLY Blood Culture results may not be optimal due to an inadequate volume of blood received in culture bottles   Culture   Final    NO GROWTH 3 DAYS Performed at [facility], [place] [address], [place], [place] [address]    Report Status PENDING  Incomplete  Culture, blood (Routine X 2) w Reflex to ID Panel     Status: None (Preliminary result)   Collection Time: 06/23/20  1:05 PM   Specimen: BLOOD RIGHT ARM  Result Value Ref Range Status   Specimen Description BLOOD RIGHT ARM  Final   Special Requests   Final    BOTTLES DRAWN AEROBIC ONLY Blood Culture results may not be optimal due to an inadequate volume of blood received in culture bottles   Culture   Final    NO GROWTH 3 DAYS Performed at [facility], [place] [address], [place], [place] [address]    Report Status PENDING  Incomplete         Radiology Studies: DG Chest Port 1 View  Result Date: 06/25/2020 CLINICAL DATA:  Dialysis catheter placement EXAM: PORTABLE CHEST 1 VIEW COMPARISON:  06/20/2020 FINDINGS: Right catheter has been removed. New left IJ approach dialysis catheter tip overlies the superior right atrium. No pneumothorax. Left mid lung atelectasis/scarring. Similar cardiomegaly. IMPRESSION:  New left IJ approach dialysis catheter tip overlies superior right atrium. No pneumothorax. Electronically Signed   By: Macy Mis M.D.   On: 06/25/2020 12:30   DG Fluoro Guide CV Line-No Report  Result Date: 06/25/2020 Fluoroscopy was utilized by the requesting physician.  No radiographic interpretation.   Scheduled Meds: . amiodarone  200 mg Oral Daily  . Chlorhexidine Gluconate Cloth  6 each Topical Daily  . clonazePAM  1 mg Oral Daily  . estradiol  1 mg Oral Daily  . famotidine  20 mg Oral Q2000  . heparin sodium (porcine)      . insulin aspart  0-5 Units Subcutaneous QHS  . insulin aspart  0-6 Units Subcutaneous TID WC  .  levothyroxine  175 mcg Oral QAC breakfast  . simvastatin  10 mg Oral QHS   Continuous Infusions: .  ceFAZolin (ANCEF) IV 1 g (06/26/20 0110)    LOS: 6 days   Time spent: 30 minutes  Nain Rudd, DO [facility] Pager: Secure chat  Between 7 PM and 7 AM please see night call on Amion

## 2020-06-27 DIAGNOSIS — R7881 Bacteremia: Secondary | ICD-10-CM | POA: Diagnosis not present

## 2020-06-27 DIAGNOSIS — B9561 Methicillin susceptible Staphylococcus aureus infection as the cause of diseases classified elsewhere: Secondary | ICD-10-CM | POA: Diagnosis not present

## 2020-06-27 DIAGNOSIS — N186 End stage renal disease: Secondary | ICD-10-CM | POA: Diagnosis not present

## 2020-06-27 DIAGNOSIS — I4891 Unspecified atrial fibrillation: Secondary | ICD-10-CM | POA: Diagnosis not present

## 2020-06-27 LAB — CBC
HCT: 31.3 % — ABNORMAL LOW (ref 36.0–46.0)
Hemoglobin: 9.6 g/dL — ABNORMAL LOW (ref 12.0–15.0)
MCH: 30.1 pg (ref 26.0–34.0)
MCHC: 30.7 g/dL (ref 30.0–36.0)
MCV: 98.1 fL (ref 80.0–100.0)
Platelets: 262 10*3/uL (ref 150–400)
RBC: 3.19 MIL/uL — ABNORMAL LOW (ref 3.87–5.11)
RDW: 15.5 % (ref 11.5–15.5)
WBC: 8.2 10*3/uL (ref 4.0–10.5)
nRBC: 0 % (ref 0.0–0.2)

## 2020-06-27 LAB — GLUCOSE, CAPILLARY
Glucose-Capillary: 200 mg/dL — ABNORMAL HIGH (ref 70–99)
Glucose-Capillary: 159 mg/dL — ABNORMAL HIGH (ref 70–99)
Glucose-Capillary: 119 mg/dL — ABNORMAL HIGH (ref 70–99)
Glucose-Capillary: 215 mg/dL — ABNORMAL HIGH (ref 70–99)
Glucose-Capillary: 146 mg/dL — ABNORMAL HIGH (ref 70–99)

## 2020-06-27 LAB — BASIC METABOLIC PANEL
Anion gap: 10 (ref 5–15)
BUN: 23 mg/dL (ref 8–23)
CO2: 26 mmol/L (ref 22–32)
Calcium: 8.7 mg/dL — ABNORMAL LOW (ref 8.9–10.3)
Chloride: 97 mmol/L — ABNORMAL LOW (ref 98–111)
Creatinine, Ser: 4.74 mg/dL — ABNORMAL HIGH (ref 0.44–1.00)
GFR, Estimated: 9 mL/min — ABNORMAL LOW (ref >60)
Glucose, Bld: 138 mg/dL — ABNORMAL HIGH (ref 70–99)
Potassium: 4.3 mmol/L (ref 3.5–5.1)
Sodium: 133 mmol/L — ABNORMAL LOW (ref 135–145)

## 2020-06-27 MED ORDER — HEPARIN SODIUM (PORCINE) 1000 UNIT/ML IJ SOLN
INTRAMUSCULAR | Status: AC
  Administered 2020-06-27: 1000 [IU]
  Filled 2020-06-27: qty 5

## 2020-06-27 NOTE — Anesthesia Preprocedure Evaluation (Addendum)
Anesthesia Evaluation  Patient identified by MRN, date of birth, ID band Patient awake    Reviewed: Allergy & Precautions, NPO status   History of Anesthesia Complications (+) PONV  Airway Mallampati: I  TM Distance: >3 FB Neck ROM: Full    Dental  (+) Poor Dentition   Pulmonary shortness of breath and Long-Term Oxygen Therapy, asthma , sleep apnea ,    Pulmonary exam normal        Cardiovascular hypertension, + CAD, + Past MI, +CHF and + DOE  + dysrhythmias Atrial Fibrillation  Rhythm:Irregular Rate:Normal     Neuro/Psych  Headaches, Anxiety Depression Dementia    GI/Hepatic hiatal hernia, GERD  ,  Endo/Other  diabetesHypothyroidism   Renal/GU ESRF and DialysisRenal disease  negative genitourinary   Musculoskeletal  (+) Arthritis ,   Abdominal (+)  Abdomen: soft. Bowel sounds: normal.  Peds  Hematology  (+) anemia ,   Anesthesia Other Findings   Reproductive/Obstetrics                            Anesthesia Physical Anesthesia Plan  ASA: IV  Anesthesia Plan: MAC   Post-op Pain Management:    Induction: Intravenous  PONV Risk Score and Plan: 3 and Propofol infusion  Airway Management Planned: Simple Face Mask, Natural Airway and Nasal Cannula  Additional Equipment: None  Intra-op Plan:   Post-operative Plan:   Informed Consent: I have reviewed the patients History and Physical, chart, labs and discussed the procedure including the risks, benefits and alternatives for the proposed anesthesia with the patient or authorized representative who has indicated his/her understanding and acceptance.     Dental advisory given  Plan Discussed with: CRNA  Anesthesia Plan Comments: (ECHO 05/22: 1. Left ventricular ejection fraction, by estimation, is 50 to 55%. The  left ventricle has low normal function. The left ventricle has no regional  wall motion abnormalities. Left  ventricular diastolic parameters are  consistent with Grade III diastolic  dysfunction (restrictive). Elevated left ventricular end-diastolic  pressure.  2. Right ventricular systolic function is normal. The right ventricular  size is moderately enlarged. Tricuspid regurgitation signal is inadequate  for assessing PA pressure.  3. Left atrial size was moderately dilated.  4. Right atrial size was moderately dilated.  5. The mitral valve is abnormal. Trivial mitral valve regurgitation. No  evidence of mitral stenosis. Moderate mitral annular calcification.  6. The aortic valve is tricuspid. There is moderate calcification of the  aortic valve. There is moderate thickening of the aortic valve. Aortic  valve regurgitation is not visualized. Mild aortic valve sclerosis is  present, with no evidence of aortic  valve stenosis.  7. The inferior vena cava is normal in size with greater than 50%  respiratory variability, suggesting right atrial pressure of 3 mmHg. )        Anesthesia Quick Evaluation

## 2020-06-27 NOTE — Progress Notes (Signed)
PROGRESS NOTE    [name]  TTS:[id number] DOB: 1940/05/24 DOA: 06/20/2020 PCP: Wannetta Sender, FNP    Brief Narrative:   80 year old female with extensive medical issues, ESRD on hemodialysis TTS schedule, paroxysmal A. fib on Eliquis, chronic respiratory failure on home oxygen, obesity, coronary artery disease, combined chronic heart failure, type 2 diabetes, hypertension, hyperlipidemia, hypothyroidism, diverticulosis and multiple comorbidities recently underwent tunneled IJ catheter placement on 5/20 due to occlusion of left femoral dialysis catheter.  She has frequent fall.  Also fell on 5/21.  Was complaining of right neck pain after catheter placement.  Continue to complain of back pain and right hip pain so came to ER. In the emergency room febrile and tachycardic on arrival.  Had received dialysis on 5/26 the day of admission.  COVID-19 negative.  Chest x-ray with streaky opacity left lung base.  Head CT negative.  X-ray of the right hip and pelvis without any injuries.  Started on daptomycin and ceftriaxone after blood cultures and admit to the hospital.  Right tunneled IJ catheter was obviously infected on presentation. Blood cultures with MSSA bacteremia.  Assessment & Plan:   Principal Problem:   MSSA bacteremia Active Problems:   Hypothyroidism   Hyperlipidemia   Atrial fibrillation ([place])   ESRD (end stage renal disease) ([place])  Severe sepsis secondary to MSSA bacteremia, infected indwelling tunneled dialysis catheter, POA: 5/26, blood cultures with MSSA 5/27, repeat blood cultures still growing MSSA, was drawn while catheter was a still in.   5/27, removal of right IJ permacath. 5/29, repeat blood cultures drawn, no growth so far.  Currently on cefazolin.  Followed by ID-appreciate insight and recommendations 5/31 Permacath placed after recurrent cultures remain negative greater than 48 hours. TTE with no obvious vegetation.  TEE to be performed tomorrow by  cardiology, to determine length of treatment.  ESRD on HD: TTS schedule . Received HD 5/26.   Renal consulted, next hemodialysis on Saturday  Paroxysmal atrial fibrillation:  -Patient currently in sinus rhythm.  Eliquis on hold for permacath placement, likely reinitiate in the next 24 hours.  On amiodarone.   -Eliquis remains on hold, will resume tomorrow after TEE if there is no further procedures anticipated.  Lynch syndrome with colon adenocarcinoma  -S/p laparoscopic right hemicolectomy 05/08/2020  Type 2 diabetes on insulin: Remains on sliding scale.  Stable.  Chronic respiratory failure: No change in oxygen requirement.  Fairly stable.  Ambulatory dysfunction, debility and frailty/frequent falls: Work with PT OT.  Lives at home with family.   DVT prophylaxis: heparin injection 5,000 Units Start: 06/26/20 1400 Place and maintain sequential compression device Start: 06/20/20 2134 **Eliquis on hold for procedure as above -restart in the next 24 hours assuming no contraindications or signs of bleeding postprocedure** Code Status: Full code Family Communication: None at bedside Disposition Plan: Status is: Inpatient  Remains inpatient appropriate because:Ongoing diagnostic testing needed not appropriate for outpatient work up and Inpatient level of care appropriate due to severity of illness   Dispo: The patient is from: Home              Anticipated d/c is to: Home with home health              Patient currently is not medically stable to d/c.   Difficult to place patient No   Consultants:  Vascular surgery Nephrology Infectious disease Cardiology fot TEE  Procedures:   None  Antimicrobials:   Daptomycin and Rocephin 5/26--5/27  Cefazolin 5/28--- ongoing  Subjective: - She denies any chest pain, shortness of breath, no nausea, no vomiting, no fever or chills.   Objective: Vitals:   06/27/20 1030 06/27/20 1100 06/27/20 1127 06/27/20 1211  BP: (!) 86/45 (!)  91/51 (!) 98/51 (!) 110/49  Pulse: (!) 54 (!) 53 (!) 54 (!) 57  Resp:   17 20  Temp:   97.8 F (36.6 C) 98.2 F (36.8 C)  TempSrc:   Oral Oral  SpO2:   100% 98%  Weight:   97 kg   Height:        Intake/Output Summary (Last 24 hours) at 06/27/2020 1416 Last data filed at 06/27/2020 1127 Gross per 24 hour  Intake --  Output 100 ml  Net -100 ml   Filed Weights   06/26/20 0031 06/27/20 0743 06/27/20 1127  Weight: 96.6 kg 97.1 kg 97 kg    Examination:  Awake Alert, Oriented X 3, No new F.N deficits, Normal affect Left upper chest permacath Symmetrical Chest wall movement, Good air movement bilaterally, CTAB RRR,No Gallops,Rubs or new Murmurs, No Parasternal Heave +ve B.Sounds, Abd Soft, No tenderness, No rebound - guarding or rigidity. No Cyanosis, Clubbing or edema, No new Rash or bruise     Data Reviewed: I have personally reviewed following labs and imaging studies  CBC: Recent Labs  Lab 06/20/20 1649 06/21/20 0226 06/23/20 0111 06/25/20 1025 06/26/20 0156 06/27/20 0034  WBC 13.1* 13.5* 7.2  --  6.7 8.2  NEUTROABS 11.1*  --  4.6  --   --   --   HGB 11.5* 10.1* 9.8* 10.9* 9.1* 9.6*  HCT 37.4 33.3* 31.2* 32.0* 29.5* 31.3*  MCV 100.0 101.5* 98.4  --  98.0 98.1  PLT 230 203 226  --  218 381   Basic Metabolic Panel: Recent Labs  Lab 06/22/20 0022 06/23/20 0111 06/24/20 0028 06/25/20 0027 06/25/20 1025 06/26/20 0156 06/27/20 0034  NA  --  133* 133* 132* 135 134* 133*  K  --  3.9 4.3 4.1 4.4 3.7 4.3  CL  --  98 99 97* 102 97* 97*  CO2  --  26 24 23   --  27 26  GLUCOSE  --  144* 170* 151* 153* 120* 138*  BUN  --  31* 37* 45* 43* 15 23  CREATININE  --  6.26* 6.99* 7.26* 7.50* 3.53* 4.74*  CALCIUM  --  8.7* 8.7* 8.7*  --  8.2* 8.7*  PHOS 4.5  --   --   --   --   --   --    GFR: Estimated Creatinine Clearance: 11 mL/min (A) (by C-G formula based on SCr of 4.74 mg/dL (H)). Liver Function Tests: Recent Labs  Lab 06/20/20 1649  AST 30  ALT 17  ALKPHOS 121   BILITOT 1.1  PROT 6.6  ALBUMIN 2.9*   No results for input(s): LIPASE, AMYLASE in the last 168 hours. No results for input(s): AMMONIA in the last 168 hours. Coagulation Profile: Recent Labs  Lab 06/20/20 1649  INR 1.6*   Cardiac Enzymes: Recent Labs  Lab 06/21/20 0226  CKTOTAL 18*   BNP (last 3 results) No results for input(s): PROBNP in the last 8760 hours. HbA1C: No results for input(s): HGBA1C in the last 72 hours. CBG: Recent Labs  Lab 06/26/20 1143 06/26/20 1653 06/26/20 2034 06/27/20 0722 06/27/20 1205  GLUCAP 188* 200* 205* 159* 119*   Lipid Profile: No results for input(s): CHOL, HDL, LDLCALC, TRIG, CHOLHDL, LDLDIRECT in the last 72 hours.  Thyroid Function Tests: No results for input(s): TSH, T4TOTAL, FREET4, T3FREE, THYROIDAB in the last 72 hours. Anemia Panel: No results for input(s): VITAMINB12, FOLATE, FERRITIN, TIBC, IRON, RETICCTPCT in the last 72 hours. Sepsis Labs: Recent Labs  Lab 06/20/20 2209 06/21/20 0226 06/21/20 0714 06/21/20 1024  LATICACIDVEN 3.7* 2.4* 1.8 1.7    Recent Results (from the past 240 hour(s))  Blood culture (routine x 2)     Status: Abnormal   Collection Time: 06/20/20  4:55 PM   Specimen: BLOOD RIGHT HAND  Result Value Ref Range Status   Specimen Description BLOOD RIGHT HAND  Final   Special Requests   Final    BOTTLES DRAWN AEROBIC AND ANAEROBIC Blood Culture results may not be optimal due to an inadequate volume of blood received in culture bottles   Culture  Setup Time   Final    GRAM POSITIVE COCCI IN CLUSTERS IN BOTH AEROBIC AND ANAEROBIC BOTTLES CRITICAL VALUE NOTED.  VALUE IS CONSISTENT WITH PREVIOUSLY REPORTED AND CALLED VALUE.    Culture (A)  Final    STAPHYLOCOCCUS AUREUS SUSCEPTIBILITIES PERFORMED ON PREVIOUS CULTURE WITHIN THE LAST 5 DAYS. Performed at [facility], [place] [address], [place], [place] [address]    Report Status 06/23/2020 FINAL  Final  Resp Panel by RT-PCR (Flu A&B, Covid)  Nasopharyngeal Swab     Status: None   Collection Time: 06/20/20  5:51 PM   Specimen: Nasopharyngeal Swab; Nasopharyngeal(NP) swabs in vial transport medium  Result Value Ref Range Status   SARS Coronavirus 2 by RT PCR NEGATIVE NEGATIVE Final    Comment: (NOTE) SARS-CoV-2 target nucleic acids are NOT DETECTED.  The SARS-CoV-2 RNA is generally detectable in upper respiratory specimens during the acute phase of infection. The lowest concentration of SARS-CoV-2 viral copies this assay can detect is 138 copies/mL. A negative result does not preclude SARS-Cov-2 infection and should not be used as the sole basis for treatment or other patient management decisions. A negative result may occur with  improper specimen collection/handling, submission of specimen other than nasopharyngeal swab, presence of viral mutation(s) within the areas targeted by this assay, and inadequate number of viral copies(<138 copies/mL). A negative result must be combined with clinical observations, patient history, and epidemiological information. The expected result is Negative.  Fact Sheet for Patients:  EntrepreneurPulse.com.au  Fact Sheet for Healthcare Providers:  IncredibleEmployment.be  This test is no t yet approved or cleared by the [place] FDA and  has been authorized for detection and/or diagnosis of SARS-CoV-2 by FDA under an Emergency Use Authorization (EUA). This EUA will remain  in effect (meaning this test can be used) for the duration of the COVID-19 declaration under Section 564(b)(1) of the Act, 21 U.S.C.section 360bbb-3(b)(1), unless the authorization is terminated  or revoked sooner.       Influenza A by PCR NEGATIVE NEGATIVE Final   Influenza B by PCR NEGATIVE NEGATIVE Final    Comment: (NOTE) The Xpert Xpress SARS-CoV-2/FLU/RSV plus assay is intended as an aid in the diagnosis of influenza from Nasopharyngeal swab specimens and should not be  used as a sole basis for treatment. Nasal washings and aspirates are unacceptable for Xpert Xpress SARS-CoV-2/FLU/RSV testing.  Fact Sheet for Patients: EntrepreneurPulse.com.au  Fact Sheet for Healthcare Providers: IncredibleEmployment.be  This test is not yet approved or cleared by the [place] FDA and has been authorized for detection and/or diagnosis of SARS-CoV-2 by FDA under an Emergency Use Authorization (EUA). This EUA will remain in effect (  meaning this test can be used) for the duration of the COVID-19 declaration under Section 564(b)(1) of the Act, 21 U.S.C. section 360bbb-3(b)(1), unless the authorization is terminated or revoked.  Performed at [facility], [place] [address] [place], [place] [address]   Blood culture (routine x 2)     Status: Abnormal   Collection Time: 06/20/20  6:25 PM   Specimen: Right Antecubital; Blood  Result Value Ref Range Status   Specimen Description RIGHT ANTECUBITAL  Final   Special Requests   Final    BOTTLES DRAWN AEROBIC ONLY Blood Culture adequate volume   Culture  Setup Time   Final    GRAM POSITIVE COCCI IN CLUSTERS AEROBIC BOTTLE ONLY CRITICAL RESULT CALLED TO, READ BACK BY AND VERIFIED WITH: PHARMD M.YATES AT 8295 ON 06/21/2020 BY T.SAAD. Performed at [facility], [place] [address] [place], [place] [address]    Culture STAPHYLOCOCCUS AUREUS (A)  Final   Report Status 06/23/2020 FINAL  Final   Organism ID, Bacteria STAPHYLOCOCCUS AUREUS  Final      Susceptibility   Staphylococcus aureus - MIC*    CIPROFLOXACIN <=0.5 SENSITIVE Sensitive     ERYTHROMYCIN RESISTANT Resistant     GENTAMICIN <=0.5 SENSITIVE Sensitive     OXACILLIN 0.5 SENSITIVE Sensitive     TETRACYCLINE <=1 SENSITIVE Sensitive     VANCOMYCIN <=0.5 SENSITIVE Sensitive     TRIMETH/SULFA <=10 SENSITIVE Sensitive     CLINDAMYCIN RESISTANT Resistant     RIFAMPIN <=0.5 SENSITIVE Sensitive     Inducible Clindamycin  POSITIVE Resistant     * STAPHYLOCOCCUS AUREUS  Blood Culture ID Panel (Reflexed)     Status: Abnormal   Collection Time: 06/20/20  6:25 PM  Result Value Ref Range Status   Enterococcus faecalis NOT DETECTED NOT DETECTED Final   Enterococcus Faecium NOT DETECTED NOT DETECTED Final   Listeria monocytogenes NOT DETECTED NOT DETECTED Final   Staphylococcus species DETECTED (A) NOT DETECTED Final   Staphylococcus aureus (BCID) DETECTED (A) NOT DETECTED Final    Comment: CRITICAL RESULT CALLED TO, READ BACK BY AND VERIFIED WITH: M. PHAM PHARMD AT 1915 06/21/20 BY D. VANHOOK    Staphylococcus epidermidis NOT DETECTED NOT DETECTED Final   Staphylococcus lugdunensis NOT DETECTED NOT DETECTED Final   Streptococcus species NOT DETECTED NOT DETECTED Final   Streptococcus agalactiae NOT DETECTED NOT DETECTED Final   Streptococcus pneumoniae NOT DETECTED NOT DETECTED Final   Streptococcus pyogenes NOT DETECTED NOT DETECTED Final   A.calcoaceticus-baumannii NOT DETECTED NOT DETECTED Final   Bacteroides fragilis NOT DETECTED NOT DETECTED Final   Enterobacterales NOT DETECTED NOT DETECTED Final   Enterobacter cloacae complex NOT DETECTED NOT DETECTED Final   Escherichia coli NOT DETECTED NOT DETECTED Final   Klebsiella aerogenes NOT DETECTED NOT DETECTED Final   Klebsiella oxytoca NOT DETECTED NOT DETECTED Final   Klebsiella pneumoniae NOT DETECTED NOT DETECTED Final   Proteus species NOT DETECTED NOT DETECTED Final   Salmonella species NOT DETECTED NOT DETECTED Final   Serratia marcescens NOT DETECTED NOT DETECTED Final   Haemophilus influenzae NOT DETECTED NOT DETECTED Final   Neisseria meningitidis NOT DETECTED NOT DETECTED Final   Pseudomonas aeruginosa NOT DETECTED NOT DETECTED Final   Stenotrophomonas maltophilia NOT DETECTED NOT DETECTED Final   Candida albicans NOT DETECTED NOT DETECTED Final   Candida auris NOT DETECTED NOT DETECTED Final   Candida glabrata NOT DETECTED NOT DETECTED Final    Candida krusei NOT DETECTED NOT DETECTED Final  Candida parapsilosis NOT DETECTED NOT DETECTED Final   Candida tropicalis NOT DETECTED NOT DETECTED Final   Cryptococcus neoformans/gattii NOT DETECTED NOT DETECTED Final   Meth resistant mecA/C and MREJ NOT DETECTED NOT DETECTED Final    Comment: Performed at [facility], [address] [place], [place] [address]  Culture, blood (routine x 2)     Status: Abnormal   Collection Time: 06/21/20  3:01 PM   Specimen: BLOOD  Result Value Ref Range Status   Specimen Description BLOOD LEFT ANTECUBITAL  Final   Special Requests   Final    BOTTLES DRAWN AEROBIC ONLY Blood Culture results may not be optimal due to an inadequate volume of blood received in culture bottles   Culture  Setup Time   Final    GRAM POSITIVE COCCI IN CLUSTERS AEROBIC BOTTLE ONLY CRITICAL VALUE NOTED.  VALUE IS CONSISTENT WITH PREVIOUSLY REPORTED AND CALLED VALUE.    Culture (A)  Final    STAPHYLOCOCCUS AUREUS SUSCEPTIBILITIES PERFORMED ON PREVIOUS CULTURE WITHIN THE LAST 5 DAYS. Performed at [facility], [place] [address], [place], [place] [address]    Report Status 06/24/2020 FINAL  Final  Culture, blood (routine x 2)     Status: Abnormal   Collection Time: 06/21/20  3:10 PM   Specimen: BLOOD RIGHT HAND  Result Value Ref Range Status   Specimen Description BLOOD RIGHT HAND  Final   Special Requests   Final    BOTTLES DRAWN AEROBIC ONLY Blood Culture results may not be optimal due to an inadequate volume of blood received in culture bottles   Culture  Setup Time   Final    GRAM POSITIVE COCCI IN CLUSTERS AEROBIC BOTTLE ONLY CRITICAL VALUE NOTED.  VALUE IS CONSISTENT WITH PREVIOUSLY REPORTED AND CALLED VALUE.    Culture (A)  Final    STAPHYLOCOCCUS AUREUS SUSCEPTIBILITIES PERFORMED ON PREVIOUS CULTURE WITHIN THE LAST 5 DAYS. Performed at [facility], [place] [address], [place], [place] [address]    Report Status 06/24/2020 FINAL  Final   Culture, blood (Routine X 2) w Reflex to ID Panel     Status: None (Preliminary result)   Collection Time: 06/23/20 12:57 PM   Specimen: BLOOD RIGHT ARM  Result Value Ref Range Status   Specimen Description BLOOD RIGHT ARM  Final   Special Requests   Final    BOTTLES DRAWN AEROBIC ONLY Blood Culture results may not be optimal due to an inadequate volume of blood received in culture bottles   Culture   Final    NO GROWTH 4 DAYS Performed at [facility], [place] [address] [place], [place] [address]    Report Status PENDING  Incomplete  Culture, blood (Routine X 2) w Reflex to ID Panel     Status: None (Preliminary result)   Collection Time: 06/23/20  1:05 PM   Specimen: BLOOD RIGHT ARM  Result Value Ref Range Status   Specimen Description BLOOD RIGHT ARM  Final   Special Requests   Final    BOTTLES DRAWN AEROBIC ONLY Blood Culture results may not be optimal due to an inadequate volume of blood received in culture bottles   Culture   Final    NO GROWTH 4 DAYS Performed at [facility], [place] [address], [place],  [address]    Report Status PENDING  Incomplete         Radiology Studies: No results found. Scheduled Meds: . amiodarone  200 mg  Oral Daily  . Chlorhexidine Gluconate Cloth  6 each Topical Daily  . clonazePAM  1 mg Oral Daily  . estradiol  1 mg Oral Daily  . famotidine  20 mg Oral Q2000  . heparin injection (subcutaneous)  5,000 Units Subcutaneous Q8H  . insulin aspart  0-5 Units Subcutaneous QHS  . insulin aspart  0-6 Units Subcutaneous TID WC  . levothyroxine  175 mcg Oral QAC breakfast  . simvastatin  10 mg Oral QHS   Continuous Infusions: .  ceFAZolin (ANCEF) IV Stopped (06/27/20 0155)    LOS: 7 days    Phillips Climes, MD [facility] Pager: Secure chat  Between 7 PM and 7 AM please see night call on Amion

## 2020-06-27 NOTE — Progress Notes (Signed)
[facility] Progress Note   Subjective:   Pt seen on HD.  Tolerating treatment well. Chest is itchy but less painful. Denies SOB, CP, palpitations, dizziness, abdominal pain and nausea.   Objective Vitals:   06/26/20 1400 06/26/20 2017 06/26/20 2018 06/27/20 0522  BP: (!) 101/45 (!) 112/53 (!) 112/53 (!) 109/51  Pulse: (!) 57 (!) 59 (!) 59 (!) 54  Resp: 19 16 16 18   Temp: 98.1 F (36.7 C) 97.8 F (36.6 C) 97.8 F (36.6 C) 97.6 F (36.4 C)  TempSrc: Oral Oral Oral Oral  SpO2:  100% 100% 100%  Weight:      Height:       Physical Exam General:Well developed female, alert and in NAD Heart:RRR, no murmurs, rubs or gallops Lungs:CTA bilaterally without wheezing, rhonchi or rales Abdomen:Soft, non-distended, +BS, + ostomy Extremities:no edema bilateral lower extremities Dialysis Access:NewL IJTDC, bruising near insertion site but no bleeding/drainage  Additional Objective Labs: Basic Metabolic Panel: Recent Labs  Lab 06/22/20 0022 06/23/20 0111 06/25/20 0027 06/25/20 1025 06/26/20 0156 06/27/20 0034  NA  --    < > 132* 135 134* 133*  K  --    < > 4.1 4.4 3.7 4.3  CL  --    < > 97* 102 97* 97*  CO2  --    < > 23  --  27 26  GLUCOSE  --    < > 151* 153* 120* 138*  BUN  --    < > 45* 43* 15 23  CREATININE  --    < > 7.26* 7.50* 3.53* 4.74*  CALCIUM  --    < > 8.7*  --  8.2* 8.7*  PHOS 4.5  --   --   --   --   --    < > = values in this interval not displayed.   Liver Function Tests: Recent Labs  Lab 06/20/20 1649  AST 30  ALT 17  ALKPHOS 121  BILITOT 1.1  PROT 6.6  ALBUMIN 2.9*   No results for input(s): LIPASE, AMYLASE in the last 168 hours. CBC: Recent Labs  Lab 06/20/20 1649 06/21/20 0226 06/23/20 0111 06/25/20 1025 06/26/20 0156 06/27/20 0034  WBC 13.1* 13.5* 7.2  --  6.7 8.2  NEUTROABS 11.1*  --  4.6  --   --   --   HGB 11.5* 10.1* 9.8* 10.9* 9.1* 9.6*  HCT 37.4 33.3* 31.2* 32.0* 29.5* 31.3*  MCV 100.0 101.5* 98.4  --   98.0 98.1  PLT 230 203 226  --  218 262   Blood Culture    Component Value Date/Time   SDES BLOOD RIGHT ARM 06/23/2020 1305   SPECREQUEST  06/23/2020 1305    BOTTLES DRAWN AEROBIC ONLY Blood Culture results may not be optimal due to an inadequate volume of blood received in culture bottles   CULT  06/23/2020 1305    NO GROWTH 4 DAYS Performed at [facility], [place] [address] [place], [place] [address]    REPTSTATUS PENDING 06/23/2020 1305    Cardiac Enzymes: Recent Labs  Lab 06/21/20 0226  CKTOTAL 18*   CBG: Recent Labs  Lab 06/25/20 1948 06/26/20 0736 06/26/20 1143 06/26/20 2034 06/27/20 0722  GLUCAP 176* 131* 188* 205* 159*   Iron Studies: No results for input(s): IRON, TIBC, TRANSFERRIN, FERRITIN in the last 72 hours. @lablastinr3 @ Studies/Results: DG Chest Port 1 View  Result Date: 06/25/2020 CLINICAL DATA:  Dialysis catheter placement EXAM: PORTABLE CHEST 1 VIEW  COMPARISON:  06/20/2020 FINDINGS: Right catheter has been removed. New left IJ approach dialysis catheter tip overlies the superior right atrium. No pneumothorax. Left mid lung atelectasis/scarring. Similar cardiomegaly. IMPRESSION: New left IJ approach dialysis catheter tip overlies superior right atrium. No pneumothorax. Electronically Signed   By: Macy Mis M.D.   On: 06/25/2020 12:30   DG Fluoro Guide CV Line-No Report  Result Date: 06/25/2020 Fluoroscopy was utilized by the requesting physician.  No radiographic interpretation.   Medications: .  ceFAZolin (ANCEF) IV Stopped (06/27/20 0155)   . amiodarone  200 mg Oral Daily  . Chlorhexidine Gluconate Cloth  6 each Topical Daily  . clonazePAM  1 mg Oral Daily  . estradiol  1 mg Oral Daily  . famotidine  20 mg Oral Q2000  . heparin injection (subcutaneous)  5,000 Units Subcutaneous Q8H  . insulin aspart  0-5 Units Subcutaneous QHS  . insulin aspart  0-6 Units Subcutaneous TID WC  . levothyroxine  175 mcg Oral QAC breakfast  .  simvastatin  10 mg Oral QHS    [facility]ialysis [facility].5h[facility] EDW98 kg, 2K, 2 CA bath,Heparin none. Accessdid have a right IJ PermCath(now removed)recent clotted left femoral AVGG Calcitriol 0.7 52mcg p.o./HD Mircera60MCG every 2 weeks last given 06/14/20  Assessment/Plan: 1. MSSA bacteremia(etiology thought to be a right IJ PermCath)= ID consulted, currently on cefazolin;HD PermCath removed 5/27.BCx from 5/27 were still positive but redrawn 5/29 as pt is clinically much improved- negative to date.New TDC placed 5/31.TEE pending to determine duration of antibiotics. Appreciate ID and VVS  2. ESRD -HD TTS,s/p line holiday, now back on regular schedule. Next HD Saturday.  3. Hypertension/volume -originally hypotensive in ER given IV fluids. Currently BP stable, no excess volume on chest x-ray,edema improved. Under EDW and not tolerating much UF with HD, UF goal 1L tomorrow. Pt reports increased urine output. 4. Anemia -Hgb9.6, not due for ESA dose yet 5. Metabolic bone disease -corrected calciumand phos controlled. Continue VDRA and binders 6. Proximal A. Fib-currently sinus rhythm Eliquis on hold on amiodarone plan per admit 7. History of chronic respiratory failure-currently no changes in oxygen requirements,no shortness of breath 8. Diabetes mellitus type 2 per admit 9. Debility and recent falls-PT OT consulting  Anice Paganini, PA-C 06/27/2020, 8:10 AM  [facility] Pager: [phone]

## 2020-06-27 NOTE — H&P (View-Only) (Signed)
PROGRESS NOTE    [name]  AJO:[id number] DOB: 15-Oct-1940 DOA: 06/20/2020 PCP: Wannetta Sender, FNP    Brief Narrative:   80 year old female with extensive medical issues, ESRD on hemodialysis TTS schedule, paroxysmal A. fib on Eliquis, chronic respiratory failure on home oxygen, obesity, coronary artery disease, combined chronic heart failure, type 2 diabetes, hypertension, hyperlipidemia, hypothyroidism, diverticulosis and multiple comorbidities recently underwent tunneled IJ catheter placement on 5/20 due to occlusion of left femoral dialysis catheter.  She has frequent fall.  Also fell on 5/21.  Was complaining of right neck pain after catheter placement.  Continue to complain of back pain and right hip pain so came to ER. In the emergency room febrile and tachycardic on arrival.  Had received dialysis on 5/26 the day of admission.  COVID-19 negative.  Chest x-ray with streaky opacity left lung base.  Head CT negative.  X-ray of the right hip and pelvis without any injuries.  Started on daptomycin and ceftriaxone after blood cultures and admit to the hospital.  Right tunneled IJ catheter was obviously infected on presentation. Blood cultures with MSSA bacteremia.  Assessment & Plan:   Principal Problem:   MSSA bacteremia Active Problems:   Hypothyroidism   Hyperlipidemia   Atrial fibrillation ([place])   ESRD (end stage renal disease) ([place])  Severe sepsis secondary to MSSA bacteremia, infected indwelling tunneled dialysis catheter, POA: 5/26, blood cultures with MSSA 5/27, repeat blood cultures still growing MSSA, was drawn while catheter was a still in.   5/27, removal of right IJ permacath. 5/29, repeat blood cultures drawn, no growth so far.  Currently on cefazolin.  Followed by ID-appreciate insight and recommendations 5/31 Permacath placed after recurrent cultures remain negative greater than 48 hours. TTE with no obvious vegetation.  TEE to be performed tomorrow by  cardiology, to determine length of treatment.  ESRD on HD: TTS schedule . Received HD 5/26.   Renal consulted, next hemodialysis on Saturday  Paroxysmal atrial fibrillation:  -Patient currently in sinus rhythm.  Eliquis on hold for permacath placement, likely reinitiate in the next 24 hours.  On amiodarone.   -Eliquis remains on hold, will resume tomorrow after TEE if there is no further procedures anticipated.  Lynch syndrome with colon adenocarcinoma  -S/p laparoscopic right hemicolectomy 05/08/2020  Type 2 diabetes on insulin: Remains on sliding scale.  Stable.  Chronic respiratory failure: No change in oxygen requirement.  Fairly stable.  Ambulatory dysfunction, debility and frailty/frequent falls: Work with PT OT.  Lives at home with family.   DVT prophylaxis: heparin injection 5,000 Units Start: 06/26/20 1400 Place and maintain sequential compression device Start: 06/20/20 2134 **Eliquis on hold for procedure as above -restart in the next 24 hours assuming no contraindications or signs of bleeding postprocedure** Code Status: Full code Family Communication: None at bedside Disposition Plan: Status is: Inpatient  Remains inpatient appropriate because:Ongoing diagnostic testing needed not appropriate for outpatient work up and Inpatient level of care appropriate due to severity of illness   Dispo: The patient is from: Home              Anticipated d/c is to: Home with home health              Patient currently is not medically stable to d/c.   Difficult to place patient No   Consultants:  Vascular surgery Nephrology Infectious disease Cardiology fot TEE  Procedures:   None  Antimicrobials:   Daptomycin and Rocephin 5/26--5/27  Cefazolin 5/28--- ongoing  Subjective: - She denies any chest pain, shortness of breath, no nausea, no vomiting, no fever or chills.   Objective: Vitals:   06/27/20 1030 06/27/20 1100 06/27/20 1127 06/27/20 1211  BP: (!) 86/45 (!)  91/51 (!) 98/51 (!) 110/49  Pulse: (!) 54 (!) 53 (!) 54 (!) 57  Resp:   17 20  Temp:   97.8 F (36.6 C) 98.2 F (36.8 C)  TempSrc:   Oral Oral  SpO2:   100% 98%  Weight:   97 kg   Height:        Intake/Output Summary (Last 24 hours) at 06/27/2020 1416 Last data filed at 06/27/2020 1127 Gross per 24 hour  Intake --  Output 100 ml  Net -100 ml   Filed Weights   06/26/20 0031 06/27/20 0743 06/27/20 1127  Weight: 96.6 kg 97.1 kg 97 kg    Examination:  Awake Alert, Oriented X 3, No new F.N deficits, Normal affect Left upper chest permacath Symmetrical Chest wall movement, Good air movement bilaterally, CTAB RRR,No Gallops,Rubs or new Murmurs, No Parasternal Heave +ve B.Sounds, Abd Soft, No tenderness, No rebound - guarding or rigidity. No Cyanosis, Clubbing or edema, No new Rash or bruise     Data Reviewed: I have personally reviewed following labs and imaging studies  CBC: Recent Labs  Lab 06/20/20 1649 06/21/20 0226 06/23/20 0111 06/25/20 1025 06/26/20 0156 06/27/20 0034  WBC 13.1* 13.5* 7.2  --  6.7 8.2  NEUTROABS 11.1*  --  4.6  --   --   --   HGB 11.5* 10.1* 9.8* 10.9* 9.1* 9.6*  HCT 37.4 33.3* 31.2* 32.0* 29.5* 31.3*  MCV 100.0 101.5* 98.4  --  98.0 98.1  PLT 230 203 226  --  218 096   Basic Metabolic Panel: Recent Labs  Lab 06/22/20 0022 06/23/20 0111 06/24/20 0028 06/25/20 0027 06/25/20 1025 06/26/20 0156 06/27/20 0034  NA  --  133* 133* 132* 135 134* 133*  K  --  3.9 4.3 4.1 4.4 3.7 4.3  CL  --  98 99 97* 102 97* 97*  CO2  --  26 24 23   --  27 26  GLUCOSE  --  144* 170* 151* 153* 120* 138*  BUN  --  31* 37* 45* 43* 15 23  CREATININE  --  6.26* 6.99* 7.26* 7.50* 3.53* 4.74*  CALCIUM  --  8.7* 8.7* 8.7*  --  8.2* 8.7*  PHOS 4.5  --   --   --   --   --   --    GFR: Estimated Creatinine Clearance: 11 mL/min (A) (by C-G formula based on SCr of 4.74 mg/dL (H)). Liver Function Tests: Recent Labs  Lab 06/20/20 1649  AST 30  ALT 17  ALKPHOS 121   BILITOT 1.1  PROT 6.6  ALBUMIN 2.9*   No results for input(s): LIPASE, AMYLASE in the last 168 hours. No results for input(s): AMMONIA in the last 168 hours. Coagulation Profile: Recent Labs  Lab 06/20/20 1649  INR 1.6*   Cardiac Enzymes: Recent Labs  Lab 06/21/20 0226  CKTOTAL 18*   BNP (last 3 results) No results for input(s): PROBNP in the last 8760 hours. HbA1C: No results for input(s): HGBA1C in the last 72 hours. CBG: Recent Labs  Lab 06/26/20 1143 06/26/20 1653 06/26/20 2034 06/27/20 0722 06/27/20 1205  GLUCAP 188* 200* 205* 159* 119*   Lipid Profile: No results for input(s): CHOL, HDL, LDLCALC, TRIG, CHOLHDL, LDLDIRECT in the last 72 hours.  Thyroid Function Tests: No results for input(s): TSH, T4TOTAL, FREET4, T3FREE, THYROIDAB in the last 72 hours. Anemia Panel: No results for input(s): VITAMINB12, FOLATE, FERRITIN, TIBC, IRON, RETICCTPCT in the last 72 hours. Sepsis Labs: Recent Labs  Lab 06/20/20 2209 06/21/20 0226 06/21/20 0714 06/21/20 1024  LATICACIDVEN 3.7* 2.4* 1.8 1.7    Recent Results (from the past 240 hour(s))  Blood culture (routine x 2)     Status: Abnormal   Collection Time: 06/20/20  4:55 PM   Specimen: BLOOD RIGHT HAND  Result Value Ref Range Status   Specimen Description BLOOD RIGHT HAND  Final   Special Requests   Final    BOTTLES DRAWN AEROBIC AND ANAEROBIC Blood Culture results may not be optimal due to an inadequate volume of blood received in culture bottles   Culture  Setup Time   Final    GRAM POSITIVE COCCI IN CLUSTERS IN BOTH AEROBIC AND ANAEROBIC BOTTLES CRITICAL VALUE NOTED.  VALUE IS CONSISTENT WITH PREVIOUSLY REPORTED AND CALLED VALUE.    Culture (A)  Final    STAPHYLOCOCCUS AUREUS SUSCEPTIBILITIES PERFORMED ON PREVIOUS CULTURE WITHIN THE LAST 5 DAYS. Performed at [facility], [place] [address], [place], [place] [address]    Report Status 06/23/2020 FINAL  Final  Resp Panel by RT-PCR (Flu A&B, Covid)  Nasopharyngeal Swab     Status: None   Collection Time: 06/20/20  5:51 PM   Specimen: Nasopharyngeal Swab; Nasopharyngeal(NP) swabs in vial transport medium  Result Value Ref Range Status   SARS Coronavirus 2 by RT PCR NEGATIVE NEGATIVE Final    Comment: (NOTE) SARS-CoV-2 target nucleic acids are NOT DETECTED.  The SARS-CoV-2 RNA is generally detectable in upper respiratory specimens during the acute phase of infection. The lowest concentration of SARS-CoV-2 viral copies this assay can detect is 138 copies/mL. A negative result does not preclude SARS-Cov-2 infection and should not be used as the sole basis for treatment or other patient management decisions. A negative result may occur with  improper specimen collection/handling, submission of specimen other than nasopharyngeal swab, presence of viral mutation(s) within the areas targeted by this assay, and inadequate number of viral copies(<138 copies/mL). A negative result must be combined with clinical observations, patient history, and epidemiological information. The expected result is Negative.  Fact Sheet for Patients:  EntrepreneurPulse.com.au  Fact Sheet for Healthcare Providers:  IncredibleEmployment.be  This test is no t yet approved or cleared by the [place] FDA and  has been authorized for detection and/or diagnosis of SARS-CoV-2 by FDA under an Emergency Use Authorization (EUA). This EUA will remain  in effect (meaning this test can be used) for the duration of the COVID-19 declaration under Section 564(b)(1) of the Act, 21 U.S.C.section 360bbb-3(b)(1), unless the authorization is terminated  or revoked sooner.       Influenza A by PCR NEGATIVE NEGATIVE Final   Influenza B by PCR NEGATIVE NEGATIVE Final    Comment: (NOTE) The Xpert Xpress SARS-CoV-2/FLU/RSV plus assay is intended as an aid in the diagnosis of influenza from Nasopharyngeal swab specimens and should not be  used as a sole basis for treatment. Nasal washings and aspirates are unacceptable for Xpert Xpress SARS-CoV-2/FLU/RSV testing.  Fact Sheet for Patients: EntrepreneurPulse.com.au  Fact Sheet for Healthcare Providers: IncredibleEmployment.be  This test is not yet approved or cleared by the [place] FDA and has been authorized for detection and/or diagnosis of SARS-CoV-2 by FDA under an Emergency Use Authorization (EUA). This EUA will remain in effect (  meaning this test can be used) for the duration of the COVID-19 declaration under Section 564(b)(1) of the Act, 21 U.S.C. section 360bbb-3(b)(1), unless the authorization is terminated or revoked.  Performed at [facility], [place] [address], [place], [place] [address]   Blood culture (routine x 2)     Status: Abnormal   Collection Time: 06/20/20  6:25 PM   Specimen: Right Antecubital; Blood  Result Value Ref Range Status   Specimen Description RIGHT ANTECUBITAL  Final   Special Requests   Final    BOTTLES DRAWN AEROBIC ONLY Blood Culture adequate volume   Culture  Setup Time   Final    GRAM POSITIVE COCCI IN CLUSTERS AEROBIC BOTTLE ONLY CRITICAL RESULT CALLED TO, READ BACK BY AND VERIFIED WITH: PHARMD M.YATES AT 9323 ON 06/21/2020 BY T.SAAD. Performed at [facility], [place] [address], [place], [place] [address]    Culture STAPHYLOCOCCUS AUREUS (A)  Final   Report Status 06/23/2020 FINAL  Final   Organism ID, Bacteria STAPHYLOCOCCUS AUREUS  Final      Susceptibility   Staphylococcus aureus - MIC*    CIPROFLOXACIN <=0.5 SENSITIVE Sensitive     ERYTHROMYCIN RESISTANT Resistant     GENTAMICIN <=0.5 SENSITIVE Sensitive     OXACILLIN 0.5 SENSITIVE Sensitive     TETRACYCLINE <=1 SENSITIVE Sensitive     VANCOMYCIN <=0.5 SENSITIVE Sensitive     TRIMETH/SULFA <=10 SENSITIVE Sensitive     CLINDAMYCIN RESISTANT Resistant     RIFAMPIN <=0.5 SENSITIVE Sensitive     Inducible Clindamycin  POSITIVE Resistant     * STAPHYLOCOCCUS AUREUS  Blood Culture ID Panel (Reflexed)     Status: Abnormal   Collection Time: 06/20/20  6:25 PM  Result Value Ref Range Status   Enterococcus faecalis NOT DETECTED NOT DETECTED Final   Enterococcus Faecium NOT DETECTED NOT DETECTED Final   Listeria monocytogenes NOT DETECTED NOT DETECTED Final   Staphylococcus species DETECTED (A) NOT DETECTED Final   Staphylococcus aureus (BCID) DETECTED (A) NOT DETECTED Final    Comment: CRITICAL RESULT CALLED TO, READ BACK BY AND VERIFIED WITH: M. PHAM PHARMD AT 1915 06/21/20 BY D. VANHOOK    Staphylococcus epidermidis NOT DETECTED NOT DETECTED Final   Staphylococcus lugdunensis NOT DETECTED NOT DETECTED Final   Streptococcus species NOT DETECTED NOT DETECTED Final   Streptococcus agalactiae NOT DETECTED NOT DETECTED Final   Streptococcus pneumoniae NOT DETECTED NOT DETECTED Final   Streptococcus pyogenes NOT DETECTED NOT DETECTED Final   A.calcoaceticus-baumannii NOT DETECTED NOT DETECTED Final   Bacteroides fragilis NOT DETECTED NOT DETECTED Final   Enterobacterales NOT DETECTED NOT DETECTED Final   Enterobacter cloacae complex NOT DETECTED NOT DETECTED Final   Escherichia coli NOT DETECTED NOT DETECTED Final   Klebsiella aerogenes NOT DETECTED NOT DETECTED Final   Klebsiella oxytoca NOT DETECTED NOT DETECTED Final   Klebsiella pneumoniae NOT DETECTED NOT DETECTED Final   Proteus species NOT DETECTED NOT DETECTED Final   Salmonella species NOT DETECTED NOT DETECTED Final   Serratia marcescens NOT DETECTED NOT DETECTED Final   Haemophilus influenzae NOT DETECTED NOT DETECTED Final   Neisseria meningitidis NOT DETECTED NOT DETECTED Final   Pseudomonas aeruginosa NOT DETECTED NOT DETECTED Final   Stenotrophomonas maltophilia NOT DETECTED NOT DETECTED Final   Candida albicans NOT DETECTED NOT DETECTED Final   Candida auris NOT DETECTED NOT DETECTED Final   Candida glabrata NOT DETECTED NOT DETECTED Final    Candida krusei NOT DETECTED NOT DETECTED Final  Candida parapsilosis NOT DETECTED NOT DETECTED Final   Candida tropicalis NOT DETECTED NOT DETECTED Final   Cryptococcus neoformans/gattii NOT DETECTED NOT DETECTED Final   Meth resistant mecA/C and MREJ NOT DETECTED NOT DETECTED Final    Comment: Performed at [facility], [address] [place], [place] [address]  Culture, blood (routine x 2)     Status: Abnormal   Collection Time: 06/21/20  3:01 PM   Specimen: BLOOD  Result Value Ref Range Status   Specimen Description BLOOD LEFT ANTECUBITAL  Final   Special Requests   Final    BOTTLES DRAWN AEROBIC ONLY Blood Culture results may not be optimal due to an inadequate volume of blood received in culture bottles   Culture  Setup Time   Final    GRAM POSITIVE COCCI IN CLUSTERS AEROBIC BOTTLE ONLY CRITICAL VALUE NOTED.  VALUE IS CONSISTENT WITH PREVIOUSLY REPORTED AND CALLED VALUE.    Culture (A)  Final    STAPHYLOCOCCUS AUREUS SUSCEPTIBILITIES PERFORMED ON PREVIOUS CULTURE WITHIN THE LAST 5 DAYS. Performed at [facility], [place] [address], [place], [place] [address]    Report Status 06/24/2020 FINAL  Final  Culture, blood (routine x 2)     Status: Abnormal   Collection Time: 06/21/20  3:10 PM   Specimen: BLOOD RIGHT HAND  Result Value Ref Range Status   Specimen Description BLOOD RIGHT HAND  Final   Special Requests   Final    BOTTLES DRAWN AEROBIC ONLY Blood Culture results may not be optimal due to an inadequate volume of blood received in culture bottles   Culture  Setup Time   Final    GRAM POSITIVE COCCI IN CLUSTERS AEROBIC BOTTLE ONLY CRITICAL VALUE NOTED.  VALUE IS CONSISTENT WITH PREVIOUSLY REPORTED AND CALLED VALUE.    Culture (A)  Final    STAPHYLOCOCCUS AUREUS SUSCEPTIBILITIES PERFORMED ON PREVIOUS CULTURE WITHIN THE LAST 5 DAYS. Performed at [facility], [place] [address] [place], [place] [address]    Report Status 06/24/2020 FINAL  Final   Culture, blood (Routine X 2) w Reflex to ID Panel     Status: None (Preliminary result)   Collection Time: 06/23/20 12:57 PM   Specimen: BLOOD RIGHT ARM  Result Value Ref Range Status   Specimen Description BLOOD RIGHT ARM  Final   Special Requests   Final    BOTTLES DRAWN AEROBIC ONLY Blood Culture results may not be optimal due to an inadequate volume of blood received in culture bottles   Culture   Final    NO GROWTH 4 DAYS Performed at [facility], [place] [address], [place], [place] [address]    Report Status PENDING  Incomplete  Culture, blood (Routine X 2) w Reflex to ID Panel     Status: None (Preliminary result)   Collection Time: 06/23/20  1:05 PM   Specimen: BLOOD RIGHT ARM  Result Value Ref Range Status   Specimen Description BLOOD RIGHT ARM  Final   Special Requests   Final    BOTTLES DRAWN AEROBIC ONLY Blood Culture results may not be optimal due to an inadequate volume of blood received in culture bottles   Culture   Final    NO GROWTH 4 DAYS Performed at [facility], [place] [address] [place],  [address]    Report Status PENDING  Incomplete         Radiology Studies: No results found. Scheduled Meds: . amiodarone  200 mg  Oral Daily  . Chlorhexidine Gluconate Cloth  6 each Topical Daily  . clonazePAM  1 mg Oral Daily  . estradiol  1 mg Oral Daily  . famotidine  20 mg Oral Q2000  . heparin injection (subcutaneous)  5,000 Units Subcutaneous Q8H  . insulin aspart  0-5 Units Subcutaneous QHS  . insulin aspart  0-6 Units Subcutaneous TID WC  . levothyroxine  175 mcg Oral QAC breakfast  . simvastatin  10 mg Oral QHS   Continuous Infusions: .  ceFAZolin (ANCEF) IV Stopped (06/27/20 0155)    LOS: 7 days    Phillips Climes, MD [facility] Pager: Secure chat  Between 7 PM and 7 AM please see night call on Amion

## 2020-06-27 NOTE — Progress Notes (Signed)
    [facility] has been requested to perform a transesophageal echocardiogram on 06/28/20 for bacteremia.  After careful review of history and examination, the risks and benefits of transesophageal echocardiogram have been explained including risks of esophageal damage, perforation (1:10,000 risk), bleeding, pharyngeal hematoma as well as other potential complications associated with conscious sedation including aspiration, arrhythmia, respiratory failure and death. Alternatives to treatment were discussed, questions were answered. Patient is willing to proceed. Labs and vital signs stable.   Reino Bellis, NP-C 06/27/2020 2:12 PM

## 2020-06-27 NOTE — Progress Notes (Signed)
PT Cancellation Note  Patient Details Name: [name] MRN: [id number] DOB: 1940/11/18   Cancelled Treatment:    Reason Eval/Treat Not Completed: Patient at procedure or test/unavailable in HD- will attempt to return if time/schedule allow.    Windell Norfolk, DPT, PN1   Supplemental Physical Therapist [facility]    Pager [phone] Acute Rehab Office [phone]

## 2020-06-28 ENCOUNTER — Encounter (HOSPITAL_COMMUNITY): Payer: Self-pay | Admitting: Internal Medicine

## 2020-06-28 ENCOUNTER — Encounter (HOSPITAL_COMMUNITY)
Admission: EM | Disposition: A | Discharge status: Home Health | Payer: Self-pay | Source: Home / Self Care | Attending: Internal Medicine

## 2020-06-28 ENCOUNTER — Inpatient Hospital Stay (HOSPITAL_COMMUNITY): Payer: Medicare HMO | Admitting: Anesthesiology

## 2020-06-28 ENCOUNTER — Inpatient Hospital Stay (HOSPITAL_COMMUNITY): Payer: Medicare HMO

## 2020-06-28 DIAGNOSIS — N186 End stage renal disease: Secondary | ICD-10-CM | POA: Diagnosis not present

## 2020-06-28 DIAGNOSIS — R7881 Bacteremia: Secondary | ICD-10-CM

## 2020-06-28 DIAGNOSIS — E039 Hypothyroidism, unspecified: Secondary | ICD-10-CM

## 2020-06-28 DIAGNOSIS — I313 Pericardial effusion (noninflammatory): Secondary | ICD-10-CM

## 2020-06-28 DIAGNOSIS — Q211 Atrial septal defect: Secondary | ICD-10-CM

## 2020-06-28 DIAGNOSIS — Z992 Dependence on renal dialysis: Secondary | ICD-10-CM

## 2020-06-28 DIAGNOSIS — I4891 Unspecified atrial fibrillation: Secondary | ICD-10-CM | POA: Diagnosis not present

## 2020-06-28 DIAGNOSIS — B9561 Methicillin susceptible Staphylococcus aureus infection as the cause of diseases classified elsewhere: Secondary | ICD-10-CM | POA: Diagnosis not present

## 2020-06-28 DIAGNOSIS — E785 Hyperlipidemia, unspecified: Secondary | ICD-10-CM

## 2020-06-28 HISTORY — PX: BUBBLE STUDY: SHX6837

## 2020-06-28 HISTORY — PX: TEE WITHOUT CARDIOVERSION: SHX5443

## 2020-06-28 LAB — CULTURE, BLOOD (ROUTINE X 2)
Culture: NO GROWTH
Culture: NO GROWTH

## 2020-06-28 LAB — CBC
HCT: 29.1 % — ABNORMAL LOW (ref 36.0–46.0)
Hemoglobin: 9 g/dL — ABNORMAL LOW (ref 12.0–15.0)
MCH: 30.5 pg (ref 26.0–34.0)
MCHC: 30.9 g/dL (ref 30.0–36.0)
MCV: 98.6 fL (ref 80.0–100.0)
Platelets: 219 10*3/uL (ref 150–400)
RBC: 2.95 MIL/uL — ABNORMAL LOW (ref 3.87–5.11)
RDW: 15.8 % — ABNORMAL HIGH (ref 11.5–15.5)
WBC: 9.3 10*3/uL (ref 4.0–10.5)
nRBC: 0 % (ref 0.0–0.2)

## 2020-06-28 LAB — BASIC METABOLIC PANEL
Anion gap: 8 (ref 5–15)
BUN: 13 mg/dL (ref 8–23)
CO2: 26 mmol/L (ref 22–32)
Calcium: 8.6 mg/dL — ABNORMAL LOW (ref 8.9–10.3)
Chloride: 101 mmol/L (ref 98–111)
Creatinine, Ser: 3.58 mg/dL — ABNORMAL HIGH (ref 0.44–1.00)
GFR, Estimated: 13 mL/min — ABNORMAL LOW (ref >60)
Glucose, Bld: 144 mg/dL — ABNORMAL HIGH (ref 70–99)
Potassium: 3.9 mmol/L (ref 3.5–5.1)
Sodium: 135 mmol/L (ref 135–145)

## 2020-06-28 LAB — GLUCOSE, CAPILLARY
Glucose-Capillary: 128 mg/dL — ABNORMAL HIGH (ref 70–99)
Glucose-Capillary: 187 mg/dL — ABNORMAL HIGH (ref 70–99)
Glucose-Capillary: 149 mg/dL — ABNORMAL HIGH (ref 70–99)
Glucose-Capillary: 199 mg/dL — ABNORMAL HIGH (ref 70–99)

## 2020-06-28 SURGERY — ECHOCARDIOGRAM, TRANSESOPHAGEAL
Anesthesia: Monitor Anesthesia Care

## 2020-06-28 MED ORDER — PROPOFOL 10 MG/ML IV BOLUS
INTRAVENOUS | Status: DC | PRN
  Administered 2020-06-28: 40 mg via INTRAVENOUS

## 2020-06-28 MED ORDER — PROPOFOL 500 MG/50ML IV EMUL
INTRAVENOUS | Status: DC | PRN
  Administered 2020-06-28: 50 ug/kg/min via INTRAVENOUS

## 2020-06-28 MED ORDER — SODIUM CHLORIDE 0.9 % IV SOLN: INTRAVENOUS | Status: DC

## 2020-06-28 MED ORDER — CEFAZOLIN SODIUM-DEXTROSE 2-4 GM/100ML-% IV SOLN
2.0000 g | INTRAVENOUS | Status: DC
  Filled 2020-06-28: qty 100

## 2020-06-28 MED ORDER — DARBEPOETIN ALFA 60 MCG/0.3ML IJ SOSY
60.0000 ug | PREFILLED_SYRINGE | INTRAMUSCULAR | Status: DC
  Filled 2020-06-28: qty 0.3

## 2020-06-28 MED ORDER — PHENYLEPHRINE 40 MCG/ML (10ML) SYRINGE FOR IV PUSH (FOR BLOOD PRESSURE SUPPORT)
PREFILLED_SYRINGE | INTRAVENOUS | Status: DC | PRN
  Administered 2020-06-28: 120 ug via INTRAVENOUS
  Administered 2020-06-28: 160 ug via INTRAVENOUS
  Administered 2020-06-28: 80 ug via INTRAVENOUS

## 2020-06-28 NOTE — Progress Notes (Signed)
[facility] Progress Note   Subjective:    Seen in room. Family at bedside. No UF with HD yesterday d/t hypotension. Had TEE this am.    Objective Vitals:   06/28/20 0824 06/28/20 0829 06/28/20 0840 06/28/20 0902  BP: (!) 123/42 (!) 102/42 (!) 106/43 (!) 112/52  Pulse: (!) 48 (!) 49 (!) 48 (!) 52  Resp: 17 16 12 18   Temp:    97.7 F (36.5 C)  TempSrc:    Oral  SpO2: 97% 100% 99% 100%  Weight:      Height:       Physical Exam General:Well developed female, alert and in NAD Heart:RRR, no murmurs, rubs or gallops Lungs:CTA bilaterally without wheezing, rhonchi or rales Abdomen:Soft, non-distended, +BS, + ostomy Extremities:no edema bilateral lower extremities Dialysis Access:NewL IJTDC, dsg c,d,i   Additional Objective Labs: Basic Metabolic Panel: Recent Labs  Lab 06/22/20 0022 06/23/20 0111 06/26/20 0156 06/27/20 0034 06/28/20 0407  NA  --    < > 134* 133* 135  K  --    < > 3.7 4.3 3.9  CL  --    < > 97* 97* 101  CO2  --    < > 27 26 26   GLUCOSE  --    < > 120* 138* 144*  BUN  --    < > 15 23 13   CREATININE  --    < > 3.53* 4.74* 3.58*  CALCIUM  --    < > 8.2* 8.7* 8.6*  PHOS 4.5  --   --   --   --    < > = values in this interval not displayed.   Liver Function Tests: No results for input(s): AST, ALT, ALKPHOS, BILITOT, PROT, ALBUMIN in the last 168 hours. No results for input(s): LIPASE, AMYLASE in the last 168 hours. CBC: Recent Labs  Lab 06/23/20 0111 06/25/20 1025 06/26/20 0156 06/27/20 0034 06/28/20 0407  WBC 7.2  --  6.7 8.2 9.3  NEUTROABS 4.6  --   --   --   --   HGB 9.8*   < > 9.1* 9.6* 9.0*  HCT 31.2*   < > 29.5* 31.3* 29.1*  MCV 98.4  --  98.0 98.1 98.6  PLT 226  --  218 262 219   < > = values in this interval not displayed.   Blood Culture    Component Value Date/Time   SDES BLOOD RIGHT ARM 06/23/2020 1305   SPECREQUEST  06/23/2020 1305    BOTTLES DRAWN AEROBIC ONLY Blood Culture results may not be optimal due  to an inadequate volume of blood received in culture bottles   CULT  06/23/2020 1305    NO GROWTH 5 DAYS Performed at [facility], [place] [address], [place], [place] [address]    REPTSTATUS 06/28/2020 FINAL 06/23/2020 1305    Cardiac Enzymes: No results for input(s): CKTOTAL, CKMB, CKMBINDEX, TROPONINI in the last 168 hours. CBG: Recent Labs  Lab 06/27/20 0722 06/27/20 1205 06/27/20 1643 06/27/20 2019 06/28/20 0904  GLUCAP 159* 119* 215* 146* 128*   Iron Studies: No results for input(s): IRON, TIBC, TRANSFERRIN, FERRITIN in the last 72 hours. @lablastinr3 @ Studies/Results: No results found. Medications: .  ceFAZolin (ANCEF) IV 1 g (06/27/20 1736)   . amiodarone  200 mg Oral Daily  . Chlorhexidine Gluconate Cloth  6 each Topical Daily  . clonazePAM  1 mg Oral Daily  . estradiol  1 mg Oral Daily  . famotidine  20  mg Oral Q2000  . heparin injection (subcutaneous)  5,000 Units Subcutaneous Q8H  . insulin aspart  0-5 Units Subcutaneous QHS  . insulin aspart  0-6 Units Subcutaneous TID WC  . levothyroxine  175 mcg Oral QAC breakfast  . simvastatin  10 mg Oral QHS    OutpatientDialysis Orders: [facility] 3.5hrsTTS EDW98 kg, 2K, 2 CA bath,Heparin none. Accessdid have a right IJ PermCath(now removed)recent clotted left femoral AVGG Calcitriol 0.7 1mcg p.o./HD Mircera60MCG every 2 weeks last given 06/14/20  Assessment/Plan: 1. MSSA bacteremia(etiology thought to be a right IJ PermCath) --- ID consulted, currently on cefazolin;HD PermCath removed 5/27.BCx from 5/27 were still positive but redrawn 5/29 as pt is clinically much improved- negative to date.New TDC placed 5/31.TEE pending to determine duration of antibiotics. Appreciate ID and VVS  2. ESRD -HD TTS,s/p line holiday, now back on regular schedule. Next HD 6/4 3. Hypertension/volume -originally hypotensive in ER given IV fluids. Currently BP stable, no excess volume on chest x-ray,edema improved.  Under EDW and not tolerating much UF with HD. Hypotensive on HD 6/2. Minimal UF goal tomorrow. Pt reports increased urine output. 4. Anemia -Hgb9.0; redose ESA with HD 6/4.  5. Metabolic bone disease -corrected calciumand phos controlled. Continue VDRA and binders 6. Proximal A. Fib-On amio. Eliquis on hold -- per primary/cards  7. History of chronic respiratory failure-currently no changes in oxygen requirements,no shortness of breath 8. Diabetes mellitus type 2 per admit 9. Debility and recent falls-PT OT consulting  Lynnda Child PA-C [facility] 06/28/2020,10:10 AM

## 2020-06-28 NOTE — Progress Notes (Signed)
PT Cancellation Note  Patient Details Name: [name] MRN: [id number] DOB: May 26, 1940   Cancelled Treatment:    Reason Eval/Treat Not Completed: Fatigue/lethargy limiting ability to participate. PT checking on pt for second time today. Pt declined, stating she is still too fatigued from procedure this morning. Max encouragement provided, and offered to assist pt to bathroom, therapeutic exercise, and ambulation. Pt continuing to decline. Will follow-up as schedule allows.    Thelma Comp 06/28/2020, 2:48 PM   Rolinda Roan, PT, DPT Acute Rehabilitation Services Pager: [phone] Office: [phone]

## 2020-06-28 NOTE — Progress Notes (Signed)
OT Cancellation Note  Patient Details Name: [name] MRN: [id number] DOB: [birth year]   Cancelled Treatment:    Reason Eval/Treat Not Completed: Other (comment). Pt had TEE earlier this AM, sitting EOB eating breakfast and reporting her head still feels a little spinney due to the anesthesia for TEE and she does not feel like doing therapy today. She also reports she is getting up by herself and going to bathroom with her RW. Dtr in room (whom she lives with) reports she is never up on her feet at home without someone with her. We will continue to follow.  [place], OTR/L Acute Rehab Services Pager [phone] Office [phone]     Almon Register 06/28/2020, 10:34 AM

## 2020-06-28 NOTE — CV Procedure (Signed)
    TRANSESOPHAGEAL ECHOCARDIOGRAM   NAME:  [name]    MRN: [id number] DOB:  May 24, 1940    ADMIT DATE: 06/20/2020  INDICATIONS: Bacteremia  PROCEDURE:   Informed consent was obtained prior to the procedure. The risks, benefits and alternatives for the procedure were discussed and the patient comprehended these risks.  Risks include, but are not limited to, cough, sore throat, vomiting, nausea, somnolence, esophageal and stomach trauma or perforation, bleeding, low blood pressure, aspiration, pneumonia, infection, trauma to the teeth and death.    Procedural time out performed. The oropharynx was anesthetized with topical 1% benzocaine.    Anesthesia was administered by Dr. Gloris Manchester.  The patient was administered 300 mg of propofol and 0 mg of lidocaine to achieve and maintain moderate conscious sedation.  The patient's heart rate, blood pressure, and oxygen saturation are monitored continuously during the procedure. The period of conscious sedation is 14 minutes, of which I was present face-to-face 100% of this time.   The transesophageal probe was inserted in the esophagus and stomach without difficulty and multiple views were obtained.   COMPLICATIONS:    There were no immediate complications.  KEY FINDINGS:  1. No evidence of endocarditis.  2. Small PFO 3. Normal LV/RF function.  4. Full report to follow. 5. Further management per primary team.   Addison Naegeli. Audie Box, MD, [place]  [address], [place], [place] [address] [phone]  8:13 AM

## 2020-06-28 NOTE — Anesthesia Postprocedure Evaluation (Signed)
Anesthesia Post Note  Patient: [name]  Procedure(s) Performed: TRANSESOPHAGEAL ECHOCARDIOGRAM (TEE) (N/A ) BUBBLE STUDY     Patient location during evaluation: Endoscopy Anesthesia Type: MAC Level of consciousness: awake and alert Pain management: pain level controlled Vital Signs Assessment: post-procedure vital signs reviewed and stable Respiratory status: spontaneous breathing, nonlabored ventilation, respiratory function stable and patient connected to nasal cannula oxygen Cardiovascular status: stable and blood pressure returned to baseline Postop Assessment: no apparent nausea or vomiting Anesthetic complications: no   No complications documented.  Last Vitals:  Vitals:   06/28/20 0840 06/28/20 0902  BP: (!) 106/43 (!) 112/52  Pulse: (!) 48 (!) 52  Resp: 12 18  Temp:  36.5 C  SpO2: 99% 100%    Last Pain:  Vitals:   06/28/20 0902  TempSrc: Oral  PainSc:                  March Rummage Christain Mcraney

## 2020-06-28 NOTE — Interval H&P Note (Signed)
History and Physical Interval Note:  06/28/2020 7:26 AM  [name]  has presented today for surgery, with the diagnosis of BACTETEMIA.  The various methods of treatment have been discussed with the patient and family. After consideration of risks, benefits and other options for treatment, the patient has consented to  Procedure(s): TRANSESOPHAGEAL ECHOCARDIOGRAM (TEE) (N/A) as a surgical intervention.  The patient's history has been reviewed, patient examined, no change in status, stable for surgery.  I have reviewed the patient's chart and labs.  Questions were answered to the patient's satisfaction.    NPO for TEE. Bacteremia is indication. EGD 03/12/2020 with gastritis and normal esophagus.   [place] T. Audie Box, MD, [place]  [address], [place], [place] [address] [phone]  7:28 AM

## 2020-06-28 NOTE — Progress Notes (Addendum)
[facility] Infectious Diseases Follow Up Note  Patient Identification: Patient Name: [name] MRN: [id number] [place] Date: 06/20/2020  4:16 PM Age: 80 y.o.Today's Date: 06/28/2020   Reason for Visit: MSSA bacteremia  Principal Problem:   MSSA bacteremia Active Problems:   Hypothyroidism   Hyperlipidemia   Atrial fibrillation ([place])   ESRD (end stage renal disease) ([place])  Antibiotics:cefazolin - current  Total days of antibiotics 9  Lines/Tubes:  Left thigh AVG , colostomy RLQ, PIVs   Interval Events: Continues to remain afebrile, no leukocytosis, hemodynamically stable, status post TEE today   Assessment High-grade MSSA bacteremia/CLABSI of right IJ hemodialysis catheter (removed on 5/27).  Blood culture prior to CVC removed on 5/27 positive for staph aureus.  Repeat blood cultures 5/29 no growth to date TTE and TEE negative for vegetations  Right neck cellulitis-clinically resolved Complex fluid and edema tracking from the right IJ into the soft tissues along the course of the catheter(no drainable fluid collection or abscess) Probable small clot within the lumen of the IJ at the insertion of the catheter ESRD on hemodialysis-new left IJ HD catheter, left thigh AV graft-no issues currently  Recommendations Continue cefazolin with HD-plan for 6 weeks from date of negative blood culture on 5/21 given clot within the IJ. End date 08/04/2020 Monitor CBC BMP on IV antibiotics A follow-up with [facility] has been made 07/16/20 at 10:45 am  PFO management per Cardiology/Cardiothoracic SX ID will sign off for now.  Call us with questions  Rest of the management as per the primary team. Thank you for the consult. Please page with pertinent questions or concerns.  ______________________________________________________________________ Subjective patient seen and examined at the bedside.  Patient is back from  TEE.  She feels little drowsy but has no complaints.  Daughter and son-in-law at bedside  Vitals BP (!) 112/52 (BP Location: Right Arm)   Pulse (!) 52   Temp 97.7 F (36.5 C) (Oral)   Resp 18   Ht 5\' 3"  (1.6 m)   Wt 97 kg   SpO2 100%   BMI 37.88 kg/m  '   Physical Exam Constitutional:  Morbid Obese lady lying in bed    Comments:   Cardiovascular:     Rate and Rhythm: Normal rate and regular rhythm.     Heart sounds: muffled  heart sounds  Pulmonary:     Effort: Pulmonary effort is normal.     Comments: No acute respiratory distress  Abdominal:     Palpations: Abdomen is soft.     Tenderness: Large abdomen nontender  Musculoskeletal:        General: No swelling or tenderness.  Left thigh AV graft with minimal bruise but no swelling tenderness  Skin:    Comments: No lesions or rashes  Neurological:     General: No focal deficit present.   Psychiatric:        Mood and Affect: Mood normal.   Pertinent Microbiology Results for orders placed or performed during the hospital encounter of 06/20/20  Blood culture (routine x 2)     Status: Abnormal   Collection Time: 06/20/20  4:55 PM   Specimen: BLOOD RIGHT HAND  Result Value Ref Range Status   Specimen Description BLOOD RIGHT HAND  Final   Special Requests   Final    BOTTLES DRAWN AEROBIC AND ANAEROBIC Blood Culture results may not be optimal due to an inadequate volume of blood received in culture bottles   Culture  Setup Time   Final  GRAM POSITIVE COCCI IN CLUSTERS IN BOTH AEROBIC AND ANAEROBIC BOTTLES CRITICAL VALUE NOTED.  VALUE IS CONSISTENT WITH PREVIOUSLY REPORTED AND CALLED VALUE.    Culture (A)  Final    STAPHYLOCOCCUS AUREUS SUSCEPTIBILITIES PERFORMED ON PREVIOUS CULTURE WITHIN THE LAST 5 DAYS. Performed at [facility], [place] [address], [place], [place] [address]    Report Status 06/23/2020 FINAL  Final  Resp Panel by RT-PCR (Flu A&B, Covid) Nasopharyngeal Swab     Status: None   Collection  Time: 06/20/20  5:51 PM   Specimen: Nasopharyngeal Swab; Nasopharyngeal(NP) swabs in vial transport medium  Result Value Ref Range Status   SARS Coronavirus 2 by RT PCR NEGATIVE NEGATIVE Final    Comment: (NOTE) SARS-CoV-2 target nucleic acids are NOT DETECTED.  The SARS-CoV-2 RNA is generally detectable in upper respiratory specimens during the acute phase of infection. The lowest concentration of SARS-CoV-2 viral copies this assay can detect is 138 copies/mL. A negative result does not preclude SARS-Cov-2 infection and should not be used as the sole basis for treatment or other patient management decisions. A negative result may occur with  improper specimen collection/handling, submission of specimen other than nasopharyngeal swab, presence of viral mutation(s) within the areas targeted by this assay, and inadequate number of viral copies(<138 copies/mL). A negative result must be combined with clinical observations, patient history, and epidemiological information. The expected result is Negative.  Fact Sheet for Patients:  EntrepreneurPulse.com.au  Fact Sheet for Healthcare Providers:  IncredibleEmployment.be  This test is no t yet approved or cleared by the [place] FDA and  has been authorized for detection and/or diagnosis of SARS-CoV-2 by FDA under an Emergency Use Authorization (EUA). This EUA will remain  in effect (meaning this test can be used) for the duration of the COVID-19 declaration under Section 564(b)(1) of the Act, 21 U.S.C.section 360bbb-3(b)(1), unless the authorization is terminated  or revoked sooner.       Influenza A by PCR NEGATIVE NEGATIVE Final   Influenza B by PCR NEGATIVE NEGATIVE Final    Comment: (NOTE) The Xpert Xpress SARS-CoV-2/FLU/RSV plus assay is intended as an aid in the diagnosis of influenza from Nasopharyngeal swab specimens and should not be used as a sole basis for treatment. Nasal washings  and aspirates are unacceptable for Xpert Xpress SARS-CoV-2/FLU/RSV testing.  Fact Sheet for Patients: EntrepreneurPulse.com.au  Fact Sheet for Healthcare Providers: IncredibleEmployment.be  This test is not yet approved or cleared by the [place] FDA and has been authorized for detection and/or diagnosis of SARS-CoV-2 by FDA under an Emergency Use Authorization (EUA). This EUA will remain in effect (meaning this test can be used) for the duration of the COVID-19 declaration under Section 564(b)(1) of the Act, 21 U.S.C. section 360bbb-3(b)(1), unless the authorization is terminated or revoked.  Performed at [facility], [place] [address] [place], [place] [address]   Blood culture (routine x 2)     Status: Abnormal   Collection Time: 06/20/20  6:25 PM   Specimen: Right Antecubital; Blood  Result Value Ref Range Status   Specimen Description RIGHT ANTECUBITAL  Final   Special Requests   Final    BOTTLES DRAWN AEROBIC ONLY Blood Culture adequate volume   Culture  Setup Time   Final    GRAM POSITIVE COCCI IN CLUSTERS AEROBIC BOTTLE ONLY CRITICAL RESULT CALLED TO, READ BACK BY AND VERIFIED WITH: PHARMD M.YATES AT 1751 ON 06/21/2020 BY T.SAAD. Performed at [facility], [place]  [address] [place], [place] [address]    Culture STAPHYLOCOCCUS AUREUS (A)  Final   Report Status 06/23/2020 FINAL  Final   Organism ID, Bacteria STAPHYLOCOCCUS AUREUS  Final      Susceptibility   Staphylococcus aureus - MIC*    CIPROFLOXACIN <=0.5 SENSITIVE Sensitive     ERYTHROMYCIN RESISTANT Resistant     GENTAMICIN <=0.5 SENSITIVE Sensitive     OXACILLIN 0.5 SENSITIVE Sensitive     TETRACYCLINE <=1 SENSITIVE Sensitive     VANCOMYCIN <=0.5 SENSITIVE Sensitive     TRIMETH/SULFA <=10 SENSITIVE Sensitive     CLINDAMYCIN RESISTANT Resistant     RIFAMPIN <=0.5 SENSITIVE Sensitive     Inducible Clindamycin POSITIVE Resistant     * STAPHYLOCOCCUS AUREUS   Blood Culture ID Panel (Reflexed)     Status: Abnormal   Collection Time: 06/20/20  6:25 PM  Result Value Ref Range Status   Enterococcus faecalis NOT DETECTED NOT DETECTED Final   Enterococcus Faecium NOT DETECTED NOT DETECTED Final   Listeria monocytogenes NOT DETECTED NOT DETECTED Final   Staphylococcus species DETECTED (A) NOT DETECTED Final   Staphylococcus aureus (BCID) DETECTED (A) NOT DETECTED Final    Comment: CRITICAL RESULT CALLED TO, READ BACK BY AND VERIFIED WITH: M. PHAM PHARMD AT 1915 06/21/20 BY D. VANHOOK    Staphylococcus epidermidis NOT DETECTED NOT DETECTED Final   Staphylococcus lugdunensis NOT DETECTED NOT DETECTED Final   Streptococcus species NOT DETECTED NOT DETECTED Final   Streptococcus agalactiae NOT DETECTED NOT DETECTED Final   Streptococcus pneumoniae NOT DETECTED NOT DETECTED Final   Streptococcus pyogenes NOT DETECTED NOT DETECTED Final   A.calcoaceticus-baumannii NOT DETECTED NOT DETECTED Final   Bacteroides fragilis NOT DETECTED NOT DETECTED Final   Enterobacterales NOT DETECTED NOT DETECTED Final   Enterobacter cloacae complex NOT DETECTED NOT DETECTED Final   Escherichia coli NOT DETECTED NOT DETECTED Final   Klebsiella aerogenes NOT DETECTED NOT DETECTED Final   Klebsiella oxytoca NOT DETECTED NOT DETECTED Final   Klebsiella pneumoniae NOT DETECTED NOT DETECTED Final   Proteus species NOT DETECTED NOT DETECTED Final   Salmonella species NOT DETECTED NOT DETECTED Final   Serratia marcescens NOT DETECTED NOT DETECTED Final   Haemophilus influenzae NOT DETECTED NOT DETECTED Final   Neisseria meningitidis NOT DETECTED NOT DETECTED Final   Pseudomonas aeruginosa NOT DETECTED NOT DETECTED Final   Stenotrophomonas maltophilia NOT DETECTED NOT DETECTED Final   Candida albicans NOT DETECTED NOT DETECTED Final   Candida auris NOT DETECTED NOT DETECTED Final   Candida glabrata NOT DETECTED NOT DETECTED Final   Candida krusei NOT DETECTED NOT DETECTED  Final   Candida parapsilosis NOT DETECTED NOT DETECTED Final   Candida tropicalis NOT DETECTED NOT DETECTED Final   Cryptococcus neoformans/gattii NOT DETECTED NOT DETECTED Final   Meth resistant mecA/C and MREJ NOT DETECTED NOT DETECTED Final    Comment: Performed at [facility], [address] [place], [place] [address]  Culture, blood (routine x 2)     Status: Abnormal   Collection Time: 06/21/20  3:01 PM   Specimen: BLOOD  Result Value Ref Range Status   Specimen Description BLOOD LEFT ANTECUBITAL  Final   Special Requests   Final    BOTTLES DRAWN AEROBIC ONLY Blood Culture results may not be optimal due to an inadequate volume of blood received in culture bottles   Culture  Setup Time   Final    GRAM POSITIVE COCCI IN CLUSTERS AEROBIC BOTTLE ONLY CRITICAL VALUE NOTED.  VALUE  IS CONSISTENT WITH PREVIOUSLY REPORTED AND CALLED VALUE.    Culture (A)  Final    STAPHYLOCOCCUS AUREUS SUSCEPTIBILITIES PERFORMED ON PREVIOUS CULTURE WITHIN THE LAST 5 DAYS. Performed at [facility], [place] [address], [place], [place] [address]    Report Status 06/24/2020 FINAL  Final  Culture, blood (routine x 2)     Status: Abnormal   Collection Time: 06/21/20  3:10 PM   Specimen: BLOOD RIGHT HAND  Result Value Ref Range Status   Specimen Description BLOOD RIGHT HAND  Final   Special Requests   Final    BOTTLES DRAWN AEROBIC ONLY Blood Culture results may not be optimal due to an inadequate volume of blood received in culture bottles   Culture  Setup Time   Final    GRAM POSITIVE COCCI IN CLUSTERS AEROBIC BOTTLE ONLY CRITICAL VALUE NOTED.  VALUE IS CONSISTENT WITH PREVIOUSLY REPORTED AND CALLED VALUE.    Culture (A)  Final    STAPHYLOCOCCUS AUREUS SUSCEPTIBILITIES PERFORMED ON PREVIOUS CULTURE WITHIN THE LAST 5 DAYS. Performed at [facility], [place] [address] [place], [place] [address]    Report Status 06/24/2020 FINAL  Final  Culture, blood (Routine X 2) w Reflex to ID Panel      Status: None   Collection Time: 06/23/20 12:57 PM   Specimen: BLOOD RIGHT ARM  Result Value Ref Range Status   Specimen Description BLOOD RIGHT ARM  Final   Special Requests   Final    BOTTLES DRAWN AEROBIC ONLY Blood Culture results may not be optimal due to an inadequate volume of blood received in culture bottles   Culture   Final    NO GROWTH 5 DAYS Performed at [facility], [place] [address], [place], [place] [address]    Report Status 06/28/2020 FINAL  Final  Culture, blood (Routine X 2) w Reflex to ID Panel     Status: None   Collection Time: 06/23/20  1:05 PM   Specimen: BLOOD RIGHT ARM  Result Value Ref Range Status   Specimen Description BLOOD RIGHT ARM  Final   Special Requests   Final    BOTTLES DRAWN AEROBIC ONLY Blood Culture results may not be optimal due to an inadequate volume of blood received in culture bottles   Culture   Final    NO GROWTH 5 DAYS Performed at [facility], [place] [address], [place], [place] [address]    Report Status 06/28/2020 FINAL  Final    Pertinent Lab. CBC Latest Ref Rng & Units 06/28/2020 06/27/2020 06/26/2020  WBC 4.0 - 10.5 K/uL 9.3 8.2 6.7  Hemoglobin 12.0 - 15.0 g/dL 9.0(L) 9.6(L) 9.1(L)  Hematocrit 36.0 - 46.0 % 29.1(L) 31.3(L) 29.5(L)  Platelets 150 - 400 K/uL 219 262 218   CMP Latest Ref Rng & Units 06/28/2020 06/27/2020 06/26/2020  Glucose 70 - 99 mg/dL 144(H) 138(H) 120(H)  BUN 8 - 23 mg/dL 13 23 15   Creatinine 0.44 - 1.00 mg/dL 3.58(H) 4.74(H) 3.53(H)  Sodium 135 - 145 mmol/L 135 133(L) 134(L)  Potassium 3.5 - 5.1 mmol/L 3.9 4.3 3.7  Chloride 98 - 111 mmol/L 101 97(L) 97(L)  CO2 22 - 32 mmol/L 26 26 27   Calcium 8.9 - 10.3 mg/dL 8.6(L) 8.7(L) 8.2(L)  Total Protein 6.5 - 8.1 g/dL - - -  Total Bilirubin 0.3 - 1.2 mg/dL - - -  Alkaline Phos 38 - 126 U/L - - -  AST 15 - 41 U/L - - -  ALT 0 - 44 U/L - - -    Pertinent Imaging today Plain films and CT images have been personally visualized and interpreted; radiology  reports have been reviewed. Decision making incorporated into the Impression / Recommendations.  TEE 06/28/20 IMPRESSIONS    1. No evidence of endocarditis. There was very minimal bubbles crossing  the IAS on bubble study, consistent with a trivial PFO.  2. Left ventricular ejection fraction, by estimation, is 50 to 55%. The  left ventricle has low normal function. The left ventricle has no regional  wall motion abnormalities.  3. Right ventricular systolic function is normal. The right ventricular  size is normal.  4. Left atrial size was mild to moderately dilated. No left atrial/left  atrial appendage thrombus was detected. The LAA emptying velocity was 54  cm/s.  5. A small pericardial effusion is present. The pericardial effusion is  circumferential. There is no evidence of cardiac tamponade.  6. The mitral valve is grossly normal. Trivial mitral valve  regurgitation. No evidence of mitral stenosis.  7. The aortic valve is tricuspid. There is mild calcification of the  aortic valve. There is mild thickening of the aortic valve. Aortic valve  regurgitation is not visualized. Mild aortic valve sclerosis is present,  with no evidence of aortic valve  stenosis.  8. There is mild (Grade II) layered plaque involving the transverse aorta  and descending aorta.  9. Agitated saline contrast bubble study was positive with shunting  observed within 3-6 cardiac cycles suggestive of interatrial shunt. There  is a small patent foramen ovale with predominantly right to left shunting  across the atrial septum.   Conclusion(s)/Recommendation(s): No evidence of vegetation/infective  endocarditis on this transesophageal  echocardiogram.   I have spent more than 35 minutes for this patient encounter including review of prior medical records, coordination of care  with greater than 50% of time being face to face/counseling and discussing diagnostics/treatment plan with the  patient/family.  Electronically signed by:   Rosiland Oz, MD Infectious Disease Physician [facility] Pager: [phone]

## 2020-06-28 NOTE — Progress Notes (Signed)
PROGRESS NOTE    [name]  DJS:[id number] DOB: [birth year] DOA: 06/20/2020 PCP: Wannetta Sender, FNP    Brief Narrative:   80 year old female with extensive medical issues, ESRD on hemodialysis TTS schedule, paroxysmal A. fib on Eliquis, chronic respiratory failure on home oxygen, obesity, coronary artery disease, combined chronic heart failure, type 2 diabetes, hypertension, hyperlipidemia, hypothyroidism, diverticulosis and multiple comorbidities recently underwent tunneled IJ catheter placement on 5/20 due to occlusion of left femoral dialysis catheter.  She has frequent fall.  Also fell on 5/21.  Was complaining of right neck pain after catheter placement.  Continue to complain of back pain and right hip pain so came to ER. In the emergency room febrile and tachycardic on arrival.  Had received dialysis on 5/26 the day of admission.  COVID-19 negative.  Chest x-ray with streaky opacity left lung base.  Head CT negative.  X-ray of the right hip and pelvis without any injuries.  Started on daptomycin and ceftriaxone after blood cultures and admit to the hospital.  Right tunneled IJ catheter was obviously infected on presentation. Blood cultures with MSSA bacteremia.  Assessment & Plan:   Principal Problem:   MSSA bacteremia Active Problems:   Hypothyroidism   Hyperlipidemia   Atrial fibrillation ([place])   ESRD (end stage renal disease) ([place])  Severe sepsis secondary to MSSA bacteremia, infected indwelling tunneled dialysis catheter, POA: 5/26, blood cultures with MSSA 5/27, repeat blood cultures still growing MSSA, was drawn while catheter was a still in.   5/27, removal of right IJ permacath. 5/29, repeat blood cultures drawn, no growth so far.  Currently on cefazolin.  Followed by ID-appreciate insight and recommendations 5/31 Permacath placed after recurrent cultures remain negative greater than 48 hours. TTE with no obvious vegetation.  TEE with no evidence of  vegetations Per ID patient will need to Continue cefazolin with HD-plan for 6 weeks from date of negative blood culture on 5/21 given clot within the IJ. End date 08/04/2020  ESRD on HD: TTS schedule . Received HD 5/26.   Renal consulted, next hemodialysis on Saturday  Paroxysmal atrial fibrillation:  -Patient currently in sinus rhythm.  Eliquis on hold for permacath placement, likely reinitiate in the next 24 hours.  On amiodarone.   -Eliquis remains on hold, will resume tomorrow after TEE if there is no further procedures anticipated.  Lynch syndrome with colon adenocarcinoma  -S/p laparoscopic right hemicolectomy 05/08/2020  Type 2 diabetes on insulin: Remains on sliding scale.  Stable.  Chronic respiratory failure: No change in oxygen requirement.  Fairly stable.  Ambulatory dysfunction, debility and frailty/frequent falls: Work with PT OT.  Lives at home with family.   DVT prophylaxis: heparin injection 5,000 Units Start: 06/26/20 1400 Place and maintain sequential compression device Start: 06/20/20 2134 **Eliquis on hold for procedure as above -restart in the next 24 hours assuming no contraindications or signs of bleeding postprocedure** Code Status: Full code Family Communication: daughter  at bedside Disposition Plan: Status is: Inpatient  Remains inpatient appropriate because:Ongoing diagnostic testing needed not appropriate for outpatient work up and Inpatient level of care appropriate due to severity of illness   Dispo: The patient is from: Home              Anticipated d/c is to: Home with home health              Patient currently is not medically stable to d/c.   Difficult to place patient No   Consultants:  Vascular  surgery Nephrology Infectious disease Cardiology fot TEE  Procedures:   None  Antimicrobials:   Daptomycin and Rocephin 5/26--5/27  Cefazolin 5/28--- ongoing  Subjective: -She denies any chest pain, shortness of breath, fever or  chills  Objective: Vitals:   06/28/20 0829 06/28/20 0840 06/28/20 0902 06/28/20 1129  BP: (!) 102/42 (!) 106/43 (!) 112/52 106/67  Pulse: (!) 49 (!) 48 (!) 52 (!) 52  Resp: 16 12 18 18   Temp:   97.7 F (36.5 C) 98 F (36.7 C)  TempSrc:   Oral Oral  SpO2: 100% 99% 100% 100%  Weight:      Height:        Intake/Output Summary (Last 24 hours) at 06/28/2020 1330 Last data filed at 06/28/2020 0813 Gross per 24 hour  Intake 300 ml  Output --  Net 300 ml   Filed Weights   06/27/20 0743 06/27/20 1127 06/28/20 0730  Weight: 97.1 kg 97 kg 97 kg    Examination:  Awake Alert, Oriented X 3, No new F.N deficits, Normal affect Symmetrical Chest wall movement, Good air movement bilaterally, CTAB RRR,No Gallops,Rubs or new Murmurs, No Parasternal Heave +ve B.Sounds, Abd Soft, No tenderness, No rebound - guarding or rigidity. No Cyanosis, Clubbing or edema, No new Rash or bruise     Data Reviewed: I have personally reviewed following labs and imaging studies  CBC: Recent Labs  Lab 06/23/20 0111 06/25/20 1025 06/26/20 0156 06/27/20 0034 06/28/20 0407  WBC 7.2  --  6.7 8.2 9.3  NEUTROABS 4.6  --   --   --   --   HGB 9.8* 10.9* 9.1* 9.6* 9.0*  HCT 31.2* 32.0* 29.5* 31.3* 29.1*  MCV 98.4  --  98.0 98.1 98.6  PLT 226  --  218 262 671   Basic Metabolic Panel: Recent Labs  Lab 06/22/20 0022 06/23/20 0111 06/24/20 0028 06/25/20 0027 06/25/20 1025 06/26/20 0156 06/27/20 0034 06/28/20 0407  NA  --    < > 133* 132* 135 134* 133* 135  K  --    < > 4.3 4.1 4.4 3.7 4.3 3.9  CL  --    < > 99 97* 102 97* 97* 101  CO2  --    < > 24 23  --  27 26 26   GLUCOSE  --    < > 170* 151* 153* 120* 138* 144*  BUN  --    < > 37* 45* 43* 15 23 13   CREATININE  --    < > 6.99* 7.26* 7.50* 3.53* 4.74* 3.58*  CALCIUM  --    < > 8.7* 8.7*  --  8.2* 8.7* 8.6*  PHOS 4.5  --   --   --   --   --   --   --    < > = values in this interval not displayed.   GFR: Estimated Creatinine Clearance: 14.6  mL/min (A) (by C-G formula based on SCr of 3.58 mg/dL (H)). Liver Function Tests: No results for input(s): AST, ALT, ALKPHOS, BILITOT, PROT, ALBUMIN in the last 168 hours. No results for input(s): LIPASE, AMYLASE in the last 168 hours. No results for input(s): AMMONIA in the last 168 hours. Coagulation Profile: No results for input(s): INR, PROTIME in the last 168 hours. Cardiac Enzymes: No results for input(s): CKTOTAL, CKMB, CKMBINDEX, TROPONINI in the last 168 hours. BNP (last 3 results) No results for input(s): PROBNP in the last 8760 hours. HbA1C: No results for input(s): HGBA1C in the last  72 hours. CBG: Recent Labs  Lab 06/27/20 1205 06/27/20 1643 06/27/20 2019 06/28/20 0904 06/28/20 1132  GLUCAP 119* 215* 146* 128* 187*   Lipid Profile: No results for input(s): CHOL, HDL, LDLCALC, TRIG, CHOLHDL, LDLDIRECT in the last 72 hours. Thyroid Function Tests: No results for input(s): TSH, T4TOTAL, FREET4, T3FREE, THYROIDAB in the last 72 hours. Anemia Panel: No results for input(s): VITAMINB12, FOLATE, FERRITIN, TIBC, IRON, RETICCTPCT in the last 72 hours. Sepsis Labs: No results for input(s): PROCALCITON, LATICACIDVEN in the last 168 hours.  Recent Results (from the past 240 hour(s))  Blood culture (routine x 2)     Status: Abnormal   Collection Time: 06/20/20  4:55 PM   Specimen: BLOOD RIGHT HAND  Result Value Ref Range Status   Specimen Description BLOOD RIGHT HAND  Final   Special Requests   Final    BOTTLES DRAWN AEROBIC AND ANAEROBIC Blood Culture results may not be optimal due to an inadequate volume of blood received in culture bottles   Culture  Setup Time   Final    GRAM POSITIVE COCCI IN CLUSTERS IN BOTH AEROBIC AND ANAEROBIC BOTTLES CRITICAL VALUE NOTED.  VALUE IS CONSISTENT WITH PREVIOUSLY REPORTED AND CALLED VALUE.    Culture (A)  Final    STAPHYLOCOCCUS AUREUS SUSCEPTIBILITIES PERFORMED ON PREVIOUS CULTURE WITHIN THE LAST 5 DAYS. Performed at [facility], [place] [address], [place], [place] [address]    Report Status 06/23/2020 FINAL  Final  Resp Panel by RT-PCR (Flu A&B, Covid) Nasopharyngeal Swab     Status: None   Collection Time: 06/20/20  5:51 PM   Specimen: Nasopharyngeal Swab; Nasopharyngeal(NP) swabs in vial transport medium  Result Value Ref Range Status   SARS Coronavirus 2 by RT PCR NEGATIVE NEGATIVE Final    Comment: (NOTE) SARS-CoV-2 target nucleic acids are NOT DETECTED.  The SARS-CoV-2 RNA is generally detectable in upper respiratory specimens during the acute phase of infection. The lowest concentration of SARS-CoV-2 viral copies this assay can detect is 138 copies/mL. A negative result does not preclude SARS-Cov-2 infection and should not be used as the sole basis for treatment or other patient management decisions. A negative result may occur with  improper specimen collection/handling, submission of specimen other than nasopharyngeal swab, presence of viral mutation(s) within the areas targeted by this assay, and inadequate number of viral copies(<138 copies/mL). A negative result must be combined with clinical observations, patient history, and epidemiological information. The expected result is Negative.  Fact Sheet for Patients:  EntrepreneurPulse.com.au  Fact Sheet for Healthcare Providers:  IncredibleEmployment.be  This test is no t yet approved or cleared by the [place] FDA and  has been authorized for detection and/or diagnosis of SARS-CoV-2 by FDA under an Emergency Use Authorization (EUA). This EUA will remain  in effect (meaning this test can be used) for the duration of the COVID-19 declaration under Section 564(b)(1) of the Act, 21 U.S.C.section 360bbb-3(b)(1), unless the authorization is terminated  or revoked sooner.       Influenza A by PCR NEGATIVE NEGATIVE Final   Influenza B by PCR NEGATIVE NEGATIVE Final    Comment: (NOTE) The Xpert Xpress  SARS-CoV-2/FLU/RSV plus assay is intended as an aid in the diagnosis of influenza from Nasopharyngeal swab specimens and should not be used as a sole basis for treatment. Nasal washings and aspirates are unacceptable for Xpert Xpress SARS-CoV-2/FLU/RSV testing.  Fact Sheet for Patients: EntrepreneurPulse.com.au  Fact Sheet for Healthcare Providers: IncredibleEmployment.be  This test is  not yet approved or cleared by the [place] and has been authorized for detection and/or diagnosis of SARS-CoV-2 by FDA under an Emergency Use Authorization (EUA). This EUA will remain in effect (meaning this test can be used) for the duration of the COVID-19 declaration under Section 564(b)(1) of the Act, 21 U.S.C. section 360bbb-3(b)(1), unless the authorization is terminated or revoked.  Performed at [facility], [place] [address], [place], [place] [address]   Blood culture (routine x 2)     Status: Abnormal   Collection Time: 06/20/20  6:25 PM   Specimen: Right Antecubital; Blood  Result Value Ref Range Status   Specimen Description RIGHT ANTECUBITAL  Final   Special Requests   Final    BOTTLES DRAWN AEROBIC ONLY Blood Culture adequate volume   Culture  Setup Time   Final    GRAM POSITIVE COCCI IN CLUSTERS AEROBIC BOTTLE ONLY CRITICAL RESULT CALLED TO, READ BACK BY AND VERIFIED WITH: PHARMD M.YATES AT 6045 ON 06/21/2020 BY T.SAAD. Performed at [facility], [place] [address] [place], [place] [address]    Culture STAPHYLOCOCCUS AUREUS (A)  Final   Report Status 06/23/2020 FINAL  Final   Organism ID, Bacteria STAPHYLOCOCCUS AUREUS  Final      Susceptibility   Staphylococcus aureus - MIC*    CIPROFLOXACIN <=0.5 SENSITIVE Sensitive     ERYTHROMYCIN RESISTANT Resistant     GENTAMICIN <=0.5 SENSITIVE Sensitive     OXACILLIN 0.5 SENSITIVE Sensitive     TETRACYCLINE <=1 SENSITIVE Sensitive     VANCOMYCIN <=0.5 SENSITIVE Sensitive      TRIMETH/SULFA <=10 SENSITIVE Sensitive     CLINDAMYCIN RESISTANT Resistant     RIFAMPIN <=0.5 SENSITIVE Sensitive     Inducible Clindamycin POSITIVE Resistant     * STAPHYLOCOCCUS AUREUS  Blood Culture ID Panel (Reflexed)     Status: Abnormal   Collection Time: 06/20/20  6:25 PM  Result Value Ref Range Status   Enterococcus faecalis NOT DETECTED NOT DETECTED Final   Enterococcus Faecium NOT DETECTED NOT DETECTED Final   Listeria monocytogenes NOT DETECTED NOT DETECTED Final   Staphylococcus species DETECTED (A) NOT DETECTED Final   Staphylococcus aureus (BCID) DETECTED (A) NOT DETECTED Final    Comment: CRITICAL RESULT CALLED TO, READ BACK BY AND VERIFIED WITH: M. PHAM PHARMD AT 1915 06/21/20 BY D. VANHOOK    Staphylococcus epidermidis NOT DETECTED NOT DETECTED Final   Staphylococcus lugdunensis NOT DETECTED NOT DETECTED Final   Streptococcus species NOT DETECTED NOT DETECTED Final   Streptococcus agalactiae NOT DETECTED NOT DETECTED Final   Streptococcus pneumoniae NOT DETECTED NOT DETECTED Final   Streptococcus pyogenes NOT DETECTED NOT DETECTED Final   A.calcoaceticus-baumannii NOT DETECTED NOT DETECTED Final   Bacteroides fragilis NOT DETECTED NOT DETECTED Final   Enterobacterales NOT DETECTED NOT DETECTED Final   Enterobacter cloacae complex NOT DETECTED NOT DETECTED Final   Escherichia coli NOT DETECTED NOT DETECTED Final   Klebsiella aerogenes NOT DETECTED NOT DETECTED Final   Klebsiella oxytoca NOT DETECTED NOT DETECTED Final   Klebsiella pneumoniae NOT DETECTED NOT DETECTED Final   Proteus species NOT DETECTED NOT DETECTED Final   Salmonella species NOT DETECTED NOT DETECTED Final   Serratia marcescens NOT DETECTED NOT DETECTED Final   Haemophilus influenzae NOT DETECTED NOT DETECTED Final   Neisseria meningitidis NOT DETECTED NOT DETECTED Final   Pseudomonas aeruginosa NOT DETECTED NOT DETECTED Final   Stenotrophomonas maltophilia NOT DETECTED NOT DETECTED Final    Candida albicans  NOT DETECTED NOT DETECTED Final   Candida auris NOT DETECTED NOT DETECTED Final   Candida glabrata NOT DETECTED NOT DETECTED Final   Candida krusei NOT DETECTED NOT DETECTED Final   Candida parapsilosis NOT DETECTED NOT DETECTED Final   Candida tropicalis NOT DETECTED NOT DETECTED Final   Cryptococcus neoformans/gattii NOT DETECTED NOT DETECTED Final   Meth resistant mecA/C and MREJ NOT DETECTED NOT DETECTED Final    Comment: Performed at [facility], [place] [address], [place], [place] [address]  Culture, blood (routine x 2)     Status: Abnormal   Collection Time: 06/21/20  3:01 PM   Specimen: BLOOD  Result Value Ref Range Status   Specimen Description BLOOD LEFT ANTECUBITAL  Final   Special Requests   Final    BOTTLES DRAWN AEROBIC ONLY Blood Culture results may not be optimal due to an inadequate volume of blood received in culture bottles   Culture  Setup Time   Final    GRAM POSITIVE COCCI IN CLUSTERS AEROBIC BOTTLE ONLY CRITICAL VALUE NOTED.  VALUE IS CONSISTENT WITH PREVIOUSLY REPORTED AND CALLED VALUE.    Culture (A)  Final    STAPHYLOCOCCUS AUREUS SUSCEPTIBILITIES PERFORMED ON PREVIOUS CULTURE WITHIN THE LAST 5 DAYS. Performed at [facility], [place] [address] [place], [place] [address]    Report Status 06/24/2020 FINAL  Final  Culture, blood (routine x 2)     Status: Abnormal   Collection Time: 06/21/20  3:10 PM   Specimen: BLOOD RIGHT HAND  Result Value Ref Range Status   Specimen Description BLOOD RIGHT HAND  Final   Special Requests   Final    BOTTLES DRAWN AEROBIC ONLY Blood Culture results may not be optimal due to an inadequate volume of blood received in culture bottles   Culture  Setup Time   Final    GRAM POSITIVE COCCI IN CLUSTERS AEROBIC BOTTLE ONLY CRITICAL VALUE NOTED.  VALUE IS CONSISTENT WITH PREVIOUSLY REPORTED AND CALLED VALUE.    Culture (A)  Final    STAPHYLOCOCCUS AUREUS SUSCEPTIBILITIES PERFORMED ON PREVIOUS CULTURE  WITHIN THE LAST 5 DAYS. Performed at [facility], [place] [address], [place], [place] [address]    Report Status 06/24/2020 FINAL  Final  Culture, blood (Routine X 2) w Reflex to ID Panel     Status: None   Collection Time: 06/23/20 12:57 PM   Specimen: BLOOD RIGHT ARM  Result Value Ref Range Status   Specimen Description BLOOD RIGHT ARM  Final   Special Requests   Final    BOTTLES DRAWN AEROBIC ONLY Blood Culture results may not be optimal due to an inadequate volume of blood received in culture bottles   Culture   Final    NO GROWTH 5 DAYS Performed at [facility], [place] [address], [place], [place] [address]    Report Status 06/28/2020 FINAL  Final  Culture, blood (Routine X 2) w Reflex to ID Panel     Status: None   Collection Time: 06/23/20  1:05 PM   Specimen: BLOOD RIGHT ARM  Result Value Ref Range Status   Specimen Description BLOOD RIGHT ARM  Final   Special Requests   Final    BOTTLES DRAWN AEROBIC ONLY Blood Culture results may not be optimal due to an inadequate volume of blood received in culture bottles   Culture   Final    NO GROWTH 5 DAYS Performed at [facility], [place] [address],  [place], [place] [address]    Report Status 06/28/2020 FINAL  Final         Radiology Studies: No results found. Scheduled Meds: . amiodarone  200 mg Oral Daily  . Chlorhexidine Gluconate Cloth  6 each Topical Daily  . clonazePAM  1 mg Oral Daily  . [START ON 06/29/2020] darbepoetin (ARANESP) injection - DIALYSIS  60 mcg Intravenous Q Sat-HD  . estradiol  1 mg Oral Daily  . famotidine  20 mg Oral Q2000  . heparin injection (subcutaneous)  5,000 Units Subcutaneous Q8H  . insulin aspart  0-5 Units Subcutaneous QHS  . insulin aspart  0-6 Units Subcutaneous TID WC  . levothyroxine  175 mcg Oral QAC breakfast  . simvastatin  10 mg Oral QHS   Continuous Infusions: .  ceFAZolin (ANCEF) IV 1 g (06/27/20 1736)  . [START ON 06/29/2020]  ceFAZolin (ANCEF) IV      LOS: 8  days    Phillips Climes, MD [facility] Pager: Secure chat  Between 7 PM and 7 AM please see night call on Amion

## 2020-06-28 NOTE — Transfer of Care (Signed)
Immediate Anesthesia Transfer of Care Note  Patient: YSABEL STANKOVICH  Procedure(s) Performed: TRANSESOPHAGEAL ECHOCARDIOGRAM (TEE) (N/A ) BUBBLE STUDY  Patient Location: PACU, Endo  Anesthesia Type:MAC  Level of Consciousness: awake, alert , oriented and patient cooperative  Airway & Oxygen Therapy: Patient Spontanous Breathing and Patient connected to nasal cannula oxygen  Post-op Assessment: Report given to RN and Post -op Vital signs reviewed and stable  Post vital signs: Reviewed and stable  Last Vitals:  Vitals Value Taken Time  BP 123/42 06/28/20 0824  Temp 36.4 C 06/28/20 0820  Pulse 49 06/28/20 0826  Resp 18 06/28/20 0826  SpO2 100 % 06/28/20 0826  Vitals shown include unvalidated device data.  Last Pain:  Vitals:   06/28/20 0820  TempSrc: Axillary  PainSc: Asleep      Patients Stated Pain Goal: 0 (44/96/75 9163)  Complications: No complications documented.

## 2020-06-28 NOTE — Progress Notes (Signed)
Responded to spiritual care consult to assist with patient with  AD. Could not complete due to no notary available. I provided education and instructions on how to proceed when ready.  Patient may be here Monday when notary is available.if so Chaplain will be called if needed.  Jaclynn Major, [place], [facility], Pager [phone]

## 2020-06-28 NOTE — Progress Notes (Signed)
  Echocardiogram Echocardiogram Transesophageal has been performed.  [name] 06/28/2020, 8:48 AM

## 2020-06-28 NOTE — Anesthesia Procedure Notes (Signed)
Procedure Name: MAC Date/Time: 06/28/2020 7:57 AM Performed by: Lowella Dell, CRNA Pre-anesthesia Checklist: Patient identified, Emergency Drugs available, Suction available, Patient being monitored and Timeout performed Patient Re-evaluated:Patient Re-evaluated prior to induction Oxygen Delivery Method: Nasal cannula Induction Type: IV induction Placement Confirmation: positive ETCO2 Dental Injury: Teeth and Oropharynx as per pre-operative assessment

## 2020-06-28 NOTE — Progress Notes (Signed)
PT Cancellation Note  Patient Details Name: [name] MRN: [id number] DOB: 1941-01-25   Cancelled Treatment:    Reason Eval/Treat Not Completed: Patient at procedure or test/unavailable currently in procedure- will attempt to return as/if time and schedule allow.    Windell Norfolk, DPT, PN1   Supplemental Physical Therapist [facility]    Pager [phone] Acute Rehab Office [phone]

## 2020-06-29 DIAGNOSIS — B9561 Methicillin susceptible Staphylococcus aureus infection as the cause of diseases classified elsewhere: Secondary | ICD-10-CM | POA: Diagnosis not present

## 2020-06-29 DIAGNOSIS — R7881 Bacteremia: Secondary | ICD-10-CM | POA: Diagnosis not present

## 2020-06-29 DIAGNOSIS — N186 End stage renal disease: Secondary | ICD-10-CM | POA: Diagnosis not present

## 2020-06-29 DIAGNOSIS — I4891 Unspecified atrial fibrillation: Secondary | ICD-10-CM | POA: Diagnosis not present

## 2020-06-29 LAB — CBC
HCT: 28.7 % — ABNORMAL LOW (ref 36.0–46.0)
Hemoglobin: 8.8 g/dL — ABNORMAL LOW (ref 12.0–15.0)
MCH: 30.4 pg (ref 26.0–34.0)
MCHC: 30.7 g/dL (ref 30.0–36.0)
MCV: 99.3 fL (ref 80.0–100.0)
Platelets: 258 10*3/uL (ref 150–400)
RBC: 2.89 MIL/uL — ABNORMAL LOW (ref 3.87–5.11)
RDW: 15.8 % — ABNORMAL HIGH (ref 11.5–15.5)
WBC: 9.1 10*3/uL (ref 4.0–10.5)
nRBC: 0.2 % (ref 0.0–0.2)

## 2020-06-29 LAB — BASIC METABOLIC PANEL
Anion gap: 9 (ref 5–15)
BUN: 18 mg/dL (ref 8–23)
CO2: 25 mmol/L (ref 22–32)
Calcium: 8.8 mg/dL — ABNORMAL LOW (ref 8.9–10.3)
Chloride: 98 mmol/L (ref 98–111)
Creatinine, Ser: 5.12 mg/dL — ABNORMAL HIGH (ref 0.44–1.00)
GFR, Estimated: 8 mL/min — ABNORMAL LOW (ref >60)
Glucose, Bld: 150 mg/dL — ABNORMAL HIGH (ref 70–99)
Potassium: 3.9 mmol/L (ref 3.5–5.1)
Sodium: 132 mmol/L — ABNORMAL LOW (ref 135–145)

## 2020-06-29 LAB — GLUCOSE, CAPILLARY
Glucose-Capillary: 126 mg/dL — ABNORMAL HIGH (ref 70–99)
Glucose-Capillary: 161 mg/dL — ABNORMAL HIGH (ref 70–99)

## 2020-06-29 MED ORDER — HEPARIN SODIUM (PORCINE) 1000 UNIT/ML IJ SOLN
INTRAMUSCULAR | Status: AC
  Administered 2020-06-29: 1000 [IU]
  Filled 2020-06-29: qty 4

## 2020-06-29 MED ORDER — CEFAZOLIN SODIUM-DEXTROSE 2-4 GM/100ML-% IV SOLN: 2.0000 g | INTRAVENOUS | 0 refills | Status: AC

## 2020-06-29 MED ORDER — DARBEPOETIN ALFA 60 MCG/0.3ML IJ SOSY
PREFILLED_SYRINGE | INTRAMUSCULAR | Status: AC
  Administered 2020-06-29: 60 ug via INTRAVENOUS
  Filled 2020-06-29: qty 0.3

## 2020-06-29 NOTE — Discharge Summary (Signed)
Physician Discharge Summary  [name] IOX:[id number] DOB: Sep 03, 1940 DOA: 06/20/2020  PCP: Wannetta Sender, FNP  Admit date: 06/20/2020 Discharge date: 06/29/2020  Admitted From: Hom Disposition:  Home   Recommendations for Outpatient Follow-up:  1. Follow up with PCP in 1-2 weeks 2. Please obtain BMP/CBC in one week 3. Patient to continue with IV cefazolin on TTS schedule for hemodialysis until 08/04/2020  Home Health:YES Equipment/Devices:None  Discharge Condition:Stable CODE STATUS:FULL Diet recommendation: renal  / Carb Modified   Brief/Interim Summary:  80 year old female with extensive medical issues, ESRD on hemodialysis TTS schedule, paroxysmal A. fib on Eliquis, chronic respiratory failure on home oxygen, obesity, coronary artery disease, combined chronic heart failure, type 2 diabetes, hypertension, hyperlipidemia, hypothyroidism, diverticulosis and multiple comorbidities recently underwent tunneled IJ catheter placement on 5/20 due to occlusion of left femoral dialysis catheter.  She has frequent fall.  Also fell on 5/21.  Was complaining of right neck pain after catheter placement.  Continue to complain of back pain and right hip pain so came to ER. In the emergency room febrile and tachycardic on arrival.  Had received dialysis on 5/26 the day of admission.  COVID-19 negative.  Chest x-ray with streaky opacity left lung base.  Head CT negative.  X-ray of the right hip and pelvis without any injuries.  Started on daptomycin and ceftriaxone after blood cultures and admit to the hospital.  Right tunneled IJ catheter was obviously infected on presentation. Blood cultures with MSSA bacteremia.  Discharge Diagnoses:  Principal Problem:   MSSA bacteremia Active Problems:   Hypothyroidism   Hyperlipidemia   Atrial fibrillation ([place])   ESRD (end stage renal disease) ([place])  Severe sepsis secondary to MSSA bacteremia, infected indwelling tunneled dialysis catheter,  POA: 5/26, blood cultures with MSSA 5/27, repeat blood cultures still growing MSSA, was drawn while catheter was a still in.   5/27, removal of right IJ permacath. 5/29, repeat blood cultures drawn, no growth so far.  Currently on cefazolin.  Followed by ID-appreciate insight and recommendations 5/31 Permacath placed after recurrent cultures remain negative greater than 48 hours. TTE with no obvious vegetation.  TEE with no evidence of vegetations Per ID patient will need to Continue cefazolin with HD-plan for 6 weeks from date of negative blood culture on 5/21 given clot within the IJ. End date 08/04/2020, this has been arranged by renal.  ESRD on HD: TTS schedule .  -Renal consulted, to continue hemodialysis on TTS schedule as an outpatient Patient blood pressure usually soft, especially on hemodialysis days.  Report she was instructed to stop midodrine by her oncologist at [facility].  Paroxysmal atrial fibrillation:  -Patient currently in sinus rhythm.  Resume home medication amiodarone and Eliquis  Lynch syndrome with colon adenocarcinoma  -S/p laparoscopic right hemicolectomy 05/08/2020  Type 2 diabetes on insulin: Remains on sliding scale.  Stable.  Chronic respiratory failure: No change in oxygen requirement.  Fairly stable.  Ambulatory dysfunction, debility and frailty/frequent falls: Work with PT OT.  Lives at home with family.   Discharge Instructions  Discharge Instructions    Diet - low sodium heart healthy   Complete by: As directed    Discharge instructions   Complete by: As directed    Follow with Primary MD Wannetta Sender, FNP in 7 days   Get CBC, CMP, checked  by Primary MD next visit.    Activity: As tolerated with Full fall precautions use walker/cane & assistance as needed   Disposition Home  Diet: Heart Healthy/renal diet with 1200 cc fluid restriction.   On your next visit with your primary care physician please Get Medicines reviewed  and adjusted.   Please request your Prim.MD to go over all Hospital Tests and Procedure/Radiological results at the follow up, please get all Hospital records sent to your Prim MD by signing hospital release before you go home.   If you experience worsening of your admission symptoms, develop shortness of breath, life threatening emergency, suicidal or homicidal thoughts you must seek medical attention immediately by calling 911 or calling your MD immediately  if symptoms less severe.  You Must read complete instructions/literature along with all the possible adverse reactions/side effects for all the Medicines you take and that have been prescribed to you. Take any new Medicines after you have completely understood and accpet all the possible adverse reactions/side effects.   Do not drive, operating heavy machinery, perform activities at heights, swimming or participation in water activities or provide baby sitting services if your were admitted for syncope or siezures until you have seen by Primary MD or a Neurologist and advised to do so again.  Do not drive when taking Pain medications.    Do not take more than prescribed Pain, Sleep and Anxiety Medications  Special Instructions: If you have smoked or chewed Tobacco  in the last 2 yrs please stop smoking, stop any regular Alcohol  and or any Recreational drug use.  Wear Seat belts while driving.   Please note  You were cared for by a hospitalist during your hospital stay. If you have any questions about your discharge medications or the care you received while you were in the hospital after you are discharged, you can call the unit and asked to speak with the hospitalist on call if the hospitalist that took care of you is not available. Once you are discharged, your primary care physician will handle any further medical issues. Please note that NO REFILLS for any discharge medications will be authorized once you are discharged, as it is  imperative that you return to your primary care physician (or establish a relationship with a primary care physician if you do not have one) for your aftercare needs so that they can reassess your need for medications and monitor your lab values.   Increase activity slowly   Complete by: As directed    No wound care   Complete by: As directed      Allergies as of 06/29/2020      Reactions   Adhesive [tape] Itching, Swelling, Rash, Other (See Comments)   Tears skin and causes blisters also. EKG pads will cause welps.    Avelox [moxifloxacin] Swelling, Rash   Blueberry Flavor Anaphylaxis   Cefprozil Shortness Of Breath, Rash   Tolerated ceftriaxone on 06/20/20   Cetacaine [butamben-tetracaine-benzocaine] Nausea And Vomiting, Swelling   Dicyclomine Nausea And Vomiting, Other (See Comments)   "Heart trouble"; Headaches and increased blood sugars "Heart trouble"; Headaches and increased blood sugars   Food Anaphylaxis, Other (See Comments)   Melons, Bananas, Cantaloupes, Watermelon-throat closes up and blisters    Imdur [isosorbide Nitrate] Hives, Palpitations, Other (See Comments), Rash   Headaches also   Januvia [sitagliptin] Shortness Of Breath   Lipitor [atorvastatin] Shortness Of Breath   Losartan Potassium Shortness Of Breath   Nitroglycerin Other (See Comments)   Caused cardiac arrest and feels like skin bring torn off back of head Caused cardiac arrest and feels like skin bring torn off back of  head   Omeprazole Shortness Of Breath, Swelling   Oxycodone Hives, Rash   Tolerates Dilaudid Tolerates Dilaudid   Penicillins Anaphylaxis   Has patient had a PCN reaction causing immediate rash, facial/tongue/throat swelling, SOB or lightheadedness with hypotension: Yes Has patient had a PCN reaction causing severe rash involving mucus membranes or skin necrosis: No Has patient had a PCN reaction that required hospitalization Yes Has patient had a PCN reaction occurring within the last 10  years: No If all of the above answers are "NO", then may proceed with Cephalosporin use. Has patient had a PCN reaction causing immediate rash, facial/tongue/throat swelling, SOB or lightheadedness with hypotension: Yes Has patient had a PCN reaction causing severe rash involving mucus membranes or skin necrosis: No Has patient had a PCN reaction that required hospitalization Yes Has patient had a PCN reaction occurring within the last 10 years: No If all of the above answers are "NO", then may proceed with Cephalosporin use.   Prednisone Anaphylaxis   Vancomycin Anaphylaxis   Hydrocodone Hives   Tolerates Dilaudid   Latex Other (See Comments), Rash   blisters   Tamiflu [oseltamivir] Other (See Comments)   Contraindicated with other medications Patient on tikosyn, and tamiflu interfered with anti arrhythmic med Contraindicated with other medications Patient on tikosyn, and tamiflu interfered with anti arrhythmic med   Feraheme [ferumoxytol] Other (See Comments)   Sharp pain to lower back and flank   Lasix [furosemide] Hives, Swelling, Rash   Mupirocin Rash      Medication List    TAKE these medications   acetaminophen 500 MG tablet Commonly known as: TYLENOL Take 0.5 tablets (250 mg total) by mouth every 6 (six) hours as needed for mild pain (pain).   albuterol 108 (90 Base) MCG/ACT inhaler Commonly known as: VENTOLIN HFA Inhale 2 puffs into the lungs every 6 (six) hours as needed for wheezing or shortness of breath.   amiodarone 200 MG tablet Commonly known as: PACERONE Take 0.5 tablets (100 mg total) by mouth daily. What changed: how much to take   ceFAZolin 2-4 GM/100ML-% IVPB Commonly known as: ANCEF Inject 100 mLs (2 g total) into the vein every Tuesday, Thursday, and Saturday at 6 PM. Patient will need cefazolin 2 g after hemodialysis on TTS schedule   cetirizine 10 MG tablet Commonly known as: ZYRTEC Take 10 mg by mouth daily.   clonazePAM 1 MG tablet Commonly  known as: KLONOPIN Take 1 mg by mouth daily.   Eliquis 5 MG Tabs tablet Generic drug: apixaban TAKE ONE TABLET BY MOUTH TWICE DAILY What changed: how much to take   estradiol 1 MG tablet Commonly known as: ESTRACE Take 1 mg by mouth daily.   ethyl chloride spray Apply 1 application topically daily as needed (Numb port).   famotidine 20 MG tablet Commonly known as: PEPCID Take 40 mg by mouth daily.   fluticasone 50 MCG/ACT nasal spray Commonly known as: FLONASE Place 1 spray into both nostrils daily.   insulin glargine 100 UNIT/ML Solostar Pen Commonly known as: LANTUS Inject 5-35 Units into the skin See admin instructions. Before Lunch per sliding scale: Under 100= 20 units, 100-180= 25 units, 180-240= 30 units, Over 240= 35 units  At 10pm  bedtime per sliding Scale: Under 100= 5 units, 100-200= 10 units, Over 200= 15 units   lidocaine-prilocaine cream Commonly known as: EMLA Apply 1 application topically as needed. What changed:   when to take this  reasons to take this   loperamide  2 MG capsule Commonly known as: IMODIUM Take 2 mg by mouth daily.   ondansetron 4 MG tablet Commonly known as: Zofran Take 1 tablet (4 mg total) by mouth every 8 (eight) hours as needed for nausea or vomiting.   promethazine 25 MG tablet Commonly known as: PHENERGAN Take 25 mg by mouth every 6 (six) hours as needed for vomiting or nausea.   Renal Vitamin 0.8 MG Tabs Take 0.8 mg by mouth daily.   simvastatin 10 MG tablet Commonly known as: ZOCOR Take 10 mg by mouth at bedtime.   Synthroid 175 MCG tablet Generic drug: levothyroxine Take 175 mcg by mouth daily.   True Metrix Blood Glucose Test test strip Generic drug: glucose blood   TRUEplus Lancets 33G Misc   vitamin B-12 500 MCG tablet Commonly known as: CYANOCOBALAMIN Take 500 mcg by mouth daily.       Allergies  Allergen Reactions  . Adhesive [Tape] Itching, Swelling, Rash and Other (See Comments)    Tears  skin and causes blisters also. EKG pads will cause welps.   . Avelox [Moxifloxacin] Swelling and Rash  . Blueberry Flavor Anaphylaxis  . Cefprozil Shortness Of Breath and Rash    Tolerated ceftriaxone on 06/20/20  . Cetacaine [Butamben-Tetracaine-Benzocaine] Nausea And Vomiting and Swelling  . Dicyclomine Nausea And Vomiting and Other (See Comments)    "Heart trouble"; Headaches and increased blood sugars "Heart trouble"; Headaches and increased blood sugars  . Food Anaphylaxis and Other (See Comments)    Melons, Bananas, Cantaloupes, Watermelon-throat closes up and blisters   . Imdur [Isosorbide Nitrate] Hives, Palpitations, Other (See Comments) and Rash    Headaches also  . Januvia [Sitagliptin] Shortness Of Breath  . Lipitor [Atorvastatin] Shortness Of Breath  . Losartan Potassium Shortness Of Breath  . Nitroglycerin Other (See Comments)    Caused cardiac arrest and feels like skin bring torn off back of head Caused cardiac arrest and feels like skin bring torn off back of head  . Omeprazole Shortness Of Breath and Swelling  . Oxycodone Hives and Rash    Tolerates Dilaudid Tolerates Dilaudid  . Penicillins Anaphylaxis    Has patient had a PCN reaction causing immediate rash, facial/tongue/throat swelling, SOB or lightheadedness with hypotension: Yes Has patient had a PCN reaction causing severe rash involving mucus membranes or skin necrosis: No Has patient had a PCN reaction that required hospitalization Yes Has patient had a PCN reaction occurring within the last 10 years: No If all of the above answers are "NO", then may proceed with Cephalosporin use. Has patient had a PCN reaction causing immediate rash, facial/tongue/throat swelling, SOB or lightheadedness with hypotension: Yes Has patient had a PCN reaction causing severe rash involving mucus membranes or skin necrosis: No Has patient had a PCN reaction that required hospitalization Yes Has patient had a PCN reaction  occurring within the last 10 years: No If all of the above answers are "NO", then may proceed with Cephalosporin use.   . Prednisone Anaphylaxis  . Vancomycin Anaphylaxis  . Hydrocodone Hives    Tolerates Dilaudid  . Latex Other (See Comments) and Rash    blisters  . Tamiflu [Oseltamivir] Other (See Comments)    Contraindicated with other medications Patient on tikosyn, and tamiflu interfered with anti arrhythmic med Contraindicated with other medications Patient on tikosyn, and tamiflu interfered with anti arrhythmic med  . Feraheme [Ferumoxytol] Other (See Comments)    Sharp pain to lower back and flank  . Lasix [Furosemide] Hives, Swelling  and Rash  . Mupirocin Rash    Consultations:  Vascular surgery Nephrology Infectious disease Cardiology for TEE    Procedures/Studies: DG Chest 1 View  Result Date: 06/20/2020 CLINICAL DATA:  Fall. EXAM: CHEST  1 VIEW COMPARISON:  06/15/2020 FINDINGS: 1936 hours. Low lung volumes. The cardio pericardial silhouette is enlarged. Right IJ central line tip overlies the right atrium. Streaky opacity at the left base suggest atelectasis. Bones are demineralized. Telemetry leads overlie the chest. IMPRESSION: Low volume film with left base atelectasis. Electronically Signed   By: Misty Stanley M.D.   On: 06/20/2020 19:41   DG Chest 2 View  Result Date: 06/15/2020 CLINICAL DATA:  Right Vas-Cath possibly dislodged from fall EXAM: CHEST - 2 VIEW COMPARISON:  05/19/2019 FINDINGS: Tunneled right internal jugular dialysis catheter in place with the tip in the right atrium, unchanged since prior study. Cardiomegaly. Aortic atherosclerosis. No confluent airspace opacities or effusions. No acute bony abnormality. IMPRESSION: Cardiomegaly.  No active disease. Right dialysis catheter in stable position Electronically Signed   By: Rolm Baptise M.D.   On: 06/15/2020 15:00   DG Lumbar Spine 2-3 Views  Result Date: 06/20/2020 CLINICAL DATA:  Fall 2 days ago.   Low back pain.  Initial encounter. EXAM: LUMBAR SPINE - 2-3 VIEW COMPARISON:  None. FINDINGS: There is no evidence of lumbar spine fracture. Mild degenerative disc disease is seen at L4-5, facet DJD is seen at L4-5 and L5-S1. Grade 1 anterolisthesis is seen at L4-5 measuring 4 mm, which is attributable to degenerative changes at this level. No focal lytic or sclerotic bone lesions identified. Aortic atherosclerotic calcification noted. IMPRESSION: No acute findings. Lower lumbar degenerative spondylosis, with grade 1 anterolisthesis at L4-5. Electronically Signed   By: Marlaine Hind M.D.   On: 06/20/2020 19:42   DG Ankle Complete Left  Result Date: 06/15/2020 CLINICAL DATA:  Fall EXAM: LEFT ANKLE COMPLETE - 3+ VIEW COMPARISON:  None. FINDINGS: Soft tissue swelling laterally. No acute bony abnormality. Specifically, no fracture, subluxation, or dislocation. IMPRESSION: No acute bony abnormality. Electronically Signed   By: Rolm Baptise M.D.   On: 06/15/2020 14:58   DG Ankle Complete Right  Result Date: 06/15/2020 CLINICAL DATA:  Fall EXAM: RIGHT ANKLE - COMPLETE 3+ VIEW COMPARISON:  None. FINDINGS: Mild lateral soft tissue swelling. No acute bony abnormality. Specifically, no fracture, subluxation, or dislocation. IMPRESSION: No acute bony abnormality. Electronically Signed   By: Rolm Baptise M.D.   On: 06/15/2020 14:58   CT Head Wo Contrast  Result Date: 06/20/2020 CLINICAL DATA:  Fall.  Head trauma. EXAM: CT HEAD WITHOUT CONTRAST TECHNIQUE: Contiguous axial images were obtained from the base of the skull through the vertex without intravenous contrast. COMPARISON:  05/15/2013 FINDINGS: Brain: There is no evidence for acute hemorrhage, hydrocephalus, mass lesion, or abnormal extra-axial fluid collection. No definite CT evidence for acute infarction. Diffuse loss of parenchymal volume is consistent with atrophy. Patchy low attenuation in the deep hemispheric and periventricular white matter is nonspecific,  but likely reflects chronic microvascular ischemic demyelination. Calcification in the posterior left cerebral hemisphere is stable consistent with benign etiology, potentially vascular malformation. Vascular: No hyperdense vessel or unexpected calcification. Skull: No evidence for fracture. No worrisome lytic or sclerotic lesion. Sinuses/Orbits: The visualized paranasal sinuses and mastoid air cells are clear. Visualized portions of the globes and intraorbital fat are unremarkable. Other: None. IMPRESSION: 1. Stable exam.  No acute intracranial abnormality. Electronically Signed   By: Misty Stanley M.D.   On: 06/20/2020  19:56   DG Chest Port 1 View  Result Date: 06/25/2020 CLINICAL DATA:  Dialysis catheter placement EXAM: PORTABLE CHEST 1 VIEW COMPARISON:  06/20/2020 FINDINGS: Right catheter has been removed. New left IJ approach dialysis catheter tip overlies the superior right atrium. No pneumothorax. Left mid lung atelectasis/scarring. Similar cardiomegaly. IMPRESSION: New left IJ approach dialysis catheter tip overlies superior right atrium. No pneumothorax. Electronically Signed   By: Macy Mis M.D.   On: 06/25/2020 12:30   DG Knee Complete 4 Views Left  Result Date: 06/15/2020 CLINICAL DATA:  Fall EXAM: LEFT KNEE - COMPLETE 4+ VIEW COMPARISON:  06/15/2020 FINDINGS: No acute bony abnormality. Specifically, no fracture, subluxation, or dislocation. No joint effusion. Joint spaces maintained. IMPRESSION: No acute bony abnormality. Electronically Signed   By: Rolm Baptise M.D.   On: 06/15/2020 14:59   DG Knee Complete 4 Views Right  Result Date: 06/15/2020 CLINICAL DATA:  Fall, pain EXAM: RIGHT KNEE - COMPLETE 4+ VIEW COMPARISON:  None. FINDINGS: No acute bony abnormality. Specifically, no fracture, subluxation, or dislocation. No joint effusion. Joint spaces maintained. Lucency noted in the patella, possibly bony erosion. This is not appear to be an acute fracture. IMPRESSION: No acute bony  abnormality. Electronically Signed   By: Rolm Baptise M.D.   On: 06/15/2020 15:00   DG Fluoro Guide CV Line-No Report  Result Date: 06/25/2020 Fluoroscopy was utilized by the requesting physician.  No radiographic interpretation.   ECHOCARDIOGRAM COMPLETE  Result Date: 06/23/2020    ECHOCARDIOGRAM REPORT   Patient Name:   [name] Date of Exam: 06/23/2020 Medical Rec #:  [id number]        Height:       63.0 in Accession #:    [id number]       Weight:       218.5 lb Date of Birth:  1940-09-12        BSA:          2.008 m Patient Age:    23 years         BP:           99/54 mmHg Patient Gender: F                HR:           56 bpm. Exam Location:  Inpatient Procedure: 2D Echo, Cardiac Doppler and Color Doppler Indications:    Bacteremia  History:        Patient has prior history of Echocardiogram examinations, most                 recent 11/22/2016. CHF, Previous Myocardial Infarction and CAD;                 Risk Factors:Dyslipidemia and Diabetes. ESRD. Hx atrial                 fibrillation. ESRD. CKD. Patient states she has an old loop                 recorder in place.  Sonographer:    Clayton Lefort RDCS (AE) Referring Phys: [id number] [facility]  Sonographer Comments: No IV access for Definity use at time of echocardiogram. IMPRESSIONS  1. Left ventricular ejection fraction, by estimation, is 50 to 55%. The left ventricle has low normal function. The left ventricle has no regional wall motion abnormalities. Left ventricular diastolic parameters are consistent with Grade III diastolic dysfunction (restrictive). Elevated left ventricular end-diastolic pressure.  2. Right ventricular  systolic function is normal. The right ventricular size is moderately enlarged. Tricuspid regurgitation signal is inadequate for assessing PA pressure.  3. Left atrial size was moderately dilated.  4. Right atrial size was moderately dilated.  5. The mitral valve is abnormal. Trivial mitral valve regurgitation. No evidence  of mitral stenosis. Moderate mitral annular calcification.  6. The aortic valve is tricuspid. There is moderate calcification of the aortic valve. There is moderate thickening of the aortic valve. Aortic valve regurgitation is not visualized. Mild aortic valve sclerosis is present, with no evidence of aortic valve stenosis.  7. The inferior vena cava is normal in size with greater than 50% respiratory variability, suggesting right atrial pressure of 3 mmHg. Comparison(s): No significant change from prior study. Conclusion(s)/Recommendation(s): No evidence of valvular vegetations on this transthoracic echocardiogram. Would recommend a transesophageal echocardiogram to exclude infective endocarditis if clinically indicated. FINDINGS  Left Ventricle: Left ventricular ejection fraction, by estimation, is 50 to 55%. The left ventricle has low normal function. The left ventricle has no regional wall motion abnormalities. The left ventricular internal cavity size was normal in size. There is no left ventricular hypertrophy. Left ventricular diastolic parameters are consistent with Grade III diastolic dysfunction (restrictive). Elevated left ventricular end-diastolic pressure. Right Ventricle: The right ventricular size is moderately enlarged. Right vetricular wall thickness was not well visualized. Right ventricular systolic function is normal. Tricuspid regurgitation signal is inadequate for assessing PA pressure. Left Atrium: Left atrial size was moderately dilated. Right Atrium: Right atrial size was moderately dilated. Pericardium: There is no evidence of pericardial effusion. Presence of pericardial fat pad. Mitral Valve: The mitral valve is abnormal. There is moderate thickening of the mitral valve leaflet(s). There is moderate calcification of the mitral valve leaflet(s). Moderate mitral annular calcification. Trivial mitral valve regurgitation. No evidence of mitral valve stenosis. MV peak gradient, 3.4 mmHg. The  mean mitral valve gradient is 1.0 mmHg. Tricuspid Valve: The tricuspid valve is normal in structure. Tricuspid valve regurgitation is trivial. Aortic Valve: The aortic valve is tricuspid. There is moderate calcification of the aortic valve. There is moderate thickening of the aortic valve. Aortic valve regurgitation is not visualized. Mild aortic valve sclerosis is present, with no evidence of aortic valve stenosis. Aortic valve mean gradient measures 7.0 mmHg. Aortic valve peak gradient measures 13.8 mmHg. Aortic valve area, by VTI measures 1.39 cm. Pulmonic Valve: The pulmonic valve was not well visualized. Pulmonic valve regurgitation is not visualized. Aorta: The aortic root was not well visualized and the aortic root, ascending aorta and aortic arch are all structurally normal, with no evidence of dilitation or obstruction. Venous: The inferior vena cava is normal in size with greater than 50% respiratory variability, suggesting right atrial pressure of 3 mmHg. IAS/Shunts: The atrial septum is grossly normal.  LEFT VENTRICLE PLAX 2D LVIDd:         5.60 cm  Diastology LVIDs:         3.90 cm  LV e' medial:    5.23 cm/s LV PW:         1.10 cm  LV E/e' medial:  19.3 LV IVS:        0.90 cm  LV e' lateral:   6.58 cm/s LVOT diam:     2.00 cm  LV E/e' lateral: 15.3 LV SV:         61 LV SV Index:   30 LVOT Area:     3.14 cm  RIGHT VENTRICLE  IVC RV Basal diam:  4.10 cm    IVC diam: 1.60 cm RV Mid diam:    3.20 cm RV S prime:     9.51 cm/s TAPSE (M-mode): 1.9 cm LEFT ATRIUM             Index       RIGHT ATRIUM           Index LA diam:        4.60 cm 2.29 cm/m  RA Area:     20.70 cm LA Vol (A2C):   91.6 ml 45.63 ml/m RA Volume:   61.90 ml  30.83 ml/m LA Vol (A4C):   78.0 ml 38.85 ml/m LA Biplane Vol: 85.0 ml 42.34 ml/m  AORTIC VALVE AV Area (Vmax):    1.50 cm AV Area (Vmean):   1.44 cm AV Area (VTI):     1.39 cm AV Vmax:           186.00 cm/s AV Vmean:          122.000 cm/s AV VTI:            0.435 m  AV Peak Grad:      13.8 mmHg AV Mean Grad:      7.0 mmHg LVOT Vmax:         88.70 cm/s LVOT Vmean:        56.100 cm/s LVOT VTI:          0.193 m LVOT/AV VTI ratio: 0.44  AORTA Ao Root diam: 3.40 cm Ao Asc diam:  3.50 cm MITRAL VALVE MV Area (PHT): 4.31 cm     SHUNTS MV Area VTI:   1.92 cm     Systemic VTI:  0.19 m MV Peak grad:  3.4 mmHg     Systemic Diam: 2.00 cm MV Mean grad:  1.0 mmHg MV Vmax:       0.92 m/s MV Vmean:      47.6 cm/s MV Decel Time: 176 msec MV E velocity: 101.00 cm/s MV A velocity: 36.50 cm/s MV E/A ratio:  2.77 Buford Dresser MD Electronically signed by Buford Dresser MD Signature Date/Time: 06/23/2020/5:59:13 PM    Final    ECHO TEE  Result Date: 06/28/2020    TRANSESOPHOGEAL ECHO REPORT   Patient Name:   [name] Date of Exam: 06/28/2020 Medical Rec #:  [id number]        Height:       63.0 in Accession #:    [id number]       Weight:       213.8 lb Date of Birth:  09-30-1940        BSA:          1.989 m Patient Age:    23 years         BP:           102/42 mmHg Patient Gender: F                HR:           52 bpm. Exam Location:  Inpatient Procedure: 2D Echo, Cardiac Doppler, Color Doppler and 3D Echo Indications:    Bacteremia  History:        Patient has prior history of Echocardiogram examinations, most                 recent 06/23/2020. CHF, Previous Myocardial Infarction,                 Arrythmias:Atrial  Fibrillation; Risk Factors:Dyslipidemia and                 Diabetes. ESRD.  Sonographer:    Clayton Lefort RDCS (AE) Referring Phys: [place]: After discussion of the risks and benefits of a TEE, an informed consent was obtained from the patient. TEE procedure time was 14 minutes. The transesophogeal probe was passed without difficulty through the esophogus of the patient. Imaged were obtained with the patient in a left lateral decubitus position. Sedation performed by different physician. The patient was monitored while under deep sedation.  Anesthestetic sedation was provided intravenously by Anesthesiology: 300mg  of Propofol. Image quality was excellent. The patient's vital signs; including heart rate, blood pressure, and oxygen saturation; remained stable throughout the procedure. The patient developed no complications during the procedure. IMPRESSIONS  1. No evidence of endocarditis. There was very minimal bubbles crossing the IAS on bubble study, consistent with a trivial PFO.  2. Left ventricular ejection fraction, by estimation, is 50 to 55%. The left ventricle has low normal function. The left ventricle has no regional wall motion abnormalities.  3. Right ventricular systolic function is normal. The right ventricular size is normal.  4. Left atrial size was mild to moderately dilated. No left atrial/left atrial appendage thrombus was detected. The LAA emptying velocity was 54 cm/s.  5. A small pericardial effusion is present. The pericardial effusion is circumferential. There is no evidence of cardiac tamponade.  6. The mitral valve is grossly normal. Trivial mitral valve regurgitation. No evidence of mitral stenosis.  7. The aortic valve is tricuspid. There is mild calcification of the aortic valve. There is mild thickening of the aortic valve. Aortic valve regurgitation is not visualized. Mild aortic valve sclerosis is present, with no evidence of aortic valve stenosis.  8. There is mild (Grade II) layered plaque involving the transverse aorta and descending aorta.  9. Agitated saline contrast bubble study was positive with shunting observed within 3-6 cardiac cycles suggestive of interatrial shunt. There is a small patent foramen ovale with predominantly right to left shunting across the atrial septum. Conclusion(s)/Recommendation(s): No evidence of vegetation/infective endocarditis on this transesophageal echocardiogram. FINDINGS  Left Ventricle: Left ventricular ejection fraction, by estimation, is 50 to 55%. The left ventricle has low  normal function. The left ventricle has no regional wall motion abnormalities. The left ventricular internal cavity size was normal in size. Right Ventricle: The right ventricular size is normal. No increase in right ventricular wall thickness. Right ventricular systolic function is normal. Left Atrium: Left atrial size was mild to moderately dilated. No left atrial/left atrial appendage thrombus was detected. The LAA emptying velocity was 54 cm/s. Right Atrium: Right atrial size was normal in size. Pericardium: A small pericardial effusion is present. The pericardial effusion is circumferential. There is no evidence of cardiac tamponade. Mitral Valve: The mitral valve is grossly normal. Mild mitral annular calcification. Trivial mitral valve regurgitation. No evidence of mitral valve stenosis. There is no evidence of mitral valve vegetation. Tricuspid Valve: The tricuspid valve is grossly normal. Tricuspid valve regurgitation is trivial. No evidence of tricuspid stenosis. There is no evidence of tricuspid valve vegetation. Aortic Valve: The aortic valve is tricuspid. There is mild calcification of the aortic valve. There is mild thickening of the aortic valve. Aortic valve regurgitation is not visualized. Mild aortic valve sclerosis is present, with no evidence of aortic valve stenosis. There is no evidence of aortic valve vegetation. Pulmonic Valve: The pulmonic valve was grossly  normal. Pulmonic valve regurgitation is trivial. No evidence of pulmonic stenosis. There is no evidence of pulmonic valve vegetation. Aorta: The aortic root and ascending aorta are structurally normal, with no evidence of dilitation. There is mild (Grade II) layered plaque involving the transverse aorta and descending aorta. Venous: The left upper pulmonary vein, left lower pulmonary vein, right lower pulmonary vein and right upper pulmonary vein are normal. A systolic blunting flow pattern is recorded from the left upper pulmonary vein.  IAS/Shunts: The atrial septum is grossly normal. Agitated saline contrast was given intravenously to evaluate for intracardiac shunting. Agitated saline contrast bubble study was positive with shunting observed within 3-6 cardiac cycles suggestive of interatrial shunt. A small patent foramen ovale is detected with predominantly right to left shunting across the atrial septum.  AORTIC VALVE LVOT Vmax:   51.04 cm/s LVOT Vmean:  38.803 cm/s LVOT VTI:    0.131 m  AORTA Ao Root diam: 3.20 cm  SHUNTS Systemic VTI: 0.13 m Eleonore Chiquito MD Electronically signed by Eleonore Chiquito MD Signature Date/Time: 06/28/2020/1:36:49 PM    Final    [place] CHEST SOFT TISSUE  Result Date: 06/21/2020 CLINICAL DATA:  80 year old female with removal of right IJ dialysis catheter. Redness over the right chest and neck. Concern for abscess. EXAM: ULTRASOUND OF HEAD/NECK SOFT TISSUES TECHNIQUE: Ultrasound examination of the head and neck soft tissues was performed in the area of clinical concern. COMPARISON:  None. FINDINGS: Targeted sonographic images of the soft tissues of the chest and right side of the neck in the region of the clinical concern was performed. There is heterogeneity of the soft tissues with complex fluid and edema tracking from the IJ into the soft tissues along the course of the catheter. A component of the collection in the right neck measures approximately 2.6 x 0.5 x 2.3 cm. No drainable fluid collection or abscess identified. There is mild hyperemia of the surrounding tissue. A 5 x 7 mm nodular density along the periphery of the lumen of the right IJ may represent a clot. IMPRESSION: 1. Complex fluid and edema tracking from the right IJ into the soft tissues along the course of the catheter. No drainable fluid collection or abscess. 2. Probable small clot within the lumen of the IJ at the insertion of the catheter. These results will be called to the ordering clinician or representative by the Radiologist Assistant, and  communication documented in the PACS or [employer]. Electronically Signed   By: Anner Crete M.D.   On: 06/21/2020 21:51   DG Hip Unilat W or Wo Pelvis 2-3 Views Right  Result Date: 06/20/2020 CLINICAL DATA:  Fall 2 days ago. Right hip pain. Initial encounter. EXAM: DG HIP (WITH OR WITHOUT PELVIS) 2-3V RIGHT COMPARISON:  None. FINDINGS: There is no evidence of hip fracture or dislocation. There is no evidence of arthropathy or other focal bone abnormality. Generalized osteopenia noted. IMPRESSION: No acute findings. Osteopenia. Electronically Signed   By: Marlaine Hind M.D.   On: 06/20/2020 19:43      Subjective:  Does report some generalized weakness, fatigue, denies any chest pain or shortness of breath, she is eager to go home today.  Discharge Exam: Vitals:   06/29/20 1025 06/29/20 1048  BP: (!) 97/48 (!) 89/57  Pulse: (!) 56   Resp: 18   Temp: 97.7 F (36.5 C) 98.1 F (36.7 C)  SpO2: 100%    Vitals:   06/29/20 1015 06/29/20 1021 06/29/20 1025 06/29/20 1048  BP: Marland Kitchen)  95/51 (!) 95/47 (!) 97/48 (!) 89/57  Pulse: (!) 55 (!) 55 (!) 56   Resp:   18   Temp:   97.7 F (36.5 C) 98.1 F (36.7 C)  TempSrc:   Tympanic Oral  SpO2:   100%   Weight:   96.3 kg   Height:        General: Pt is alert, awake, not in acute distress, pale, obese Cardiovascular: RRR, S1/S2 +, no rubs, no gallops, left upper chest/neck permacath bruising significantly improved Respiratory: CTA bilaterally, no wheezing, no rhonchi Abdominal: Soft, NT, ND, bowel sounds + Extremities: no edema, no cyanosis    The results of significant diagnostics from this hospitalization (including imaging, microbiology, ancillary and laboratory) are listed below for reference.     Microbiology: Recent Results (from the past 240 hour(s))  Blood culture (routine x 2)     Status: Abnormal   Collection Time: 06/20/20  4:55 PM   Specimen: BLOOD RIGHT HAND  Result Value Ref Range Status   Specimen Description  BLOOD RIGHT HAND  Final   Special Requests   Final    BOTTLES DRAWN AEROBIC AND ANAEROBIC Blood Culture results may not be optimal due to an inadequate volume of blood received in culture bottles   Culture  Setup Time   Final    GRAM POSITIVE COCCI IN CLUSTERS IN BOTH AEROBIC AND ANAEROBIC BOTTLES CRITICAL VALUE NOTED.  VALUE IS CONSISTENT WITH PREVIOUSLY REPORTED AND CALLED VALUE.    Culture (A)  Final    STAPHYLOCOCCUS AUREUS SUSCEPTIBILITIES PERFORMED ON PREVIOUS CULTURE WITHIN THE LAST 5 DAYS. Performed at [facility], [place] [address], [place], [place] [address]    Report Status 06/23/2020 FINAL  Final  Resp Panel by RT-PCR (Flu A&B, Covid) Nasopharyngeal Swab     Status: None   Collection Time: 06/20/20  5:51 PM   Specimen: Nasopharyngeal Swab; Nasopharyngeal(NP) swabs in vial transport medium  Result Value Ref Range Status   SARS Coronavirus 2 by RT PCR NEGATIVE NEGATIVE Final    Comment: (NOTE) SARS-CoV-2 target nucleic acids are NOT DETECTED.  The SARS-CoV-2 RNA is generally detectable in upper respiratory specimens during the acute phase of infection. The lowest concentration of SARS-CoV-2 viral copies this assay can detect is 138 copies/mL. A negative result does not preclude SARS-Cov-2 infection and should not be used as the sole basis for treatment or other patient management decisions. A negative result may occur with  improper specimen collection/handling, submission of specimen other than nasopharyngeal swab, presence of viral mutation(s) within the areas targeted by this assay, and inadequate number of viral copies(<138 copies/mL). A negative result must be combined with clinical observations, patient history, and epidemiological information. The expected result is Negative.  Fact Sheet for Patients:  EntrepreneurPulse.com.au  Fact Sheet for Healthcare Providers:  IncredibleEmployment.be  This test is no t yet approved  or cleared by the [place] FDA and  has been authorized for detection and/or diagnosis of SARS-CoV-2 by FDA under an Emergency Use Authorization (EUA). This EUA will remain  in effect (meaning this test can be used) for the duration of the COVID-19 declaration under Section 564(b)(1) of the Act, 21 U.S.C.section 360bbb-3(b)(1), unless the authorization is terminated  or revoked sooner.       Influenza A by PCR NEGATIVE NEGATIVE Final   Influenza B by PCR NEGATIVE NEGATIVE Final    Comment: (NOTE) The Xpert Xpress SARS-CoV-2/FLU/RSV plus assay is intended as an aid in the diagnosis of influenza  from Nasopharyngeal swab specimens and should not be used as a sole basis for treatment. Nasal washings and aspirates are unacceptable for Xpert Xpress SARS-CoV-2/FLU/RSV testing.  Fact Sheet for Patients: EntrepreneurPulse.com.au  Fact Sheet for Healthcare Providers: IncredibleEmployment.be  This test is not yet approved or cleared by the [place] FDA and has been authorized for detection and/or diagnosis of SARS-CoV-2 by FDA under an Emergency Use Authorization (EUA). This EUA will remain in effect (meaning this test can be used) for the duration of the COVID-19 declaration under Section 564(b)(1) of the Act, 21 U.S.C. section 360bbb-3(b)(1), unless the authorization is terminated or revoked.  Performed at [facility], [place] [address] [place], [place] [address]   Blood culture (routine x 2)     Status: Abnormal   Collection Time: 06/20/20  6:25 PM   Specimen: Right Antecubital; Blood  Result Value Ref Range Status   Specimen Description RIGHT ANTECUBITAL  Final   Special Requests   Final    BOTTLES DRAWN AEROBIC ONLY Blood Culture adequate volume   Culture  Setup Time   Final    GRAM POSITIVE COCCI IN CLUSTERS AEROBIC BOTTLE ONLY CRITICAL RESULT CALLED TO, READ BACK BY AND VERIFIED WITH: PHARMD M.YATES AT 0737 ON 06/21/2020 BY  T.SAAD. Performed at [facility], [place] [address] [place], [place] [address]    Culture STAPHYLOCOCCUS AUREUS (A)  Final   Report Status 06/23/2020 FINAL  Final   Organism ID, Bacteria STAPHYLOCOCCUS AUREUS  Final      Susceptibility   Staphylococcus aureus - MIC*    CIPROFLOXACIN <=0.5 SENSITIVE Sensitive     ERYTHROMYCIN RESISTANT Resistant     GENTAMICIN <=0.5 SENSITIVE Sensitive     OXACILLIN 0.5 SENSITIVE Sensitive     TETRACYCLINE <=1 SENSITIVE Sensitive     VANCOMYCIN <=0.5 SENSITIVE Sensitive     TRIMETH/SULFA <=10 SENSITIVE Sensitive     CLINDAMYCIN RESISTANT Resistant     RIFAMPIN <=0.5 SENSITIVE Sensitive     Inducible Clindamycin POSITIVE Resistant     * STAPHYLOCOCCUS AUREUS  Blood Culture ID Panel (Reflexed)     Status: Abnormal   Collection Time: 06/20/20  6:25 PM  Result Value Ref Range Status   Enterococcus faecalis NOT DETECTED NOT DETECTED Final   Enterococcus Faecium NOT DETECTED NOT DETECTED Final   Listeria monocytogenes NOT DETECTED NOT DETECTED Final   Staphylococcus species DETECTED (A) NOT DETECTED Final   Staphylococcus aureus (BCID) DETECTED (A) NOT DETECTED Final    Comment: CRITICAL RESULT CALLED TO, READ BACK BY AND VERIFIED WITH: M. PHAM PHARMD AT 1915 06/21/20 BY D. VANHOOK    Staphylococcus epidermidis NOT DETECTED NOT DETECTED Final   Staphylococcus lugdunensis NOT DETECTED NOT DETECTED Final   Streptococcus species NOT DETECTED NOT DETECTED Final   Streptococcus agalactiae NOT DETECTED NOT DETECTED Final   Streptococcus pneumoniae NOT DETECTED NOT DETECTED Final   Streptococcus pyogenes NOT DETECTED NOT DETECTED Final   A.calcoaceticus-baumannii NOT DETECTED NOT DETECTED Final   Bacteroides fragilis NOT DETECTED NOT DETECTED Final   Enterobacterales NOT DETECTED NOT DETECTED Final   Enterobacter cloacae complex NOT DETECTED NOT DETECTED Final   Escherichia coli NOT DETECTED NOT DETECTED Final   Klebsiella aerogenes NOT DETECTED NOT  DETECTED Final   Klebsiella oxytoca NOT DETECTED NOT DETECTED Final   Klebsiella pneumoniae NOT DETECTED NOT DETECTED Final   Proteus species NOT DETECTED NOT DETECTED Final   Salmonella species NOT DETECTED NOT DETECTED Final   Serratia marcescens NOT DETECTED  NOT DETECTED Final   Haemophilus influenzae NOT DETECTED NOT DETECTED Final   Neisseria meningitidis NOT DETECTED NOT DETECTED Final   Pseudomonas aeruginosa NOT DETECTED NOT DETECTED Final   Stenotrophomonas maltophilia NOT DETECTED NOT DETECTED Final   Candida albicans NOT DETECTED NOT DETECTED Final   Candida auris NOT DETECTED NOT DETECTED Final   Candida glabrata NOT DETECTED NOT DETECTED Final   Candida krusei NOT DETECTED NOT DETECTED Final   Candida parapsilosis NOT DETECTED NOT DETECTED Final   Candida tropicalis NOT DETECTED NOT DETECTED Final   Cryptococcus neoformans/gattii NOT DETECTED NOT DETECTED Final   Meth resistant mecA/C and MREJ NOT DETECTED NOT DETECTED Final    Comment: Performed at [facility], [place] [address], [place], [place] [address]  Culture, blood (routine x 2)     Status: Abnormal   Collection Time: 06/21/20  3:01 PM   Specimen: BLOOD  Result Value Ref Range Status   Specimen Description BLOOD LEFT ANTECUBITAL  Final   Special Requests   Final    BOTTLES DRAWN AEROBIC ONLY Blood Culture results may not be optimal due to an inadequate volume of blood received in culture bottles   Culture  Setup Time   Final    GRAM POSITIVE COCCI IN CLUSTERS AEROBIC BOTTLE ONLY CRITICAL VALUE NOTED.  VALUE IS CONSISTENT WITH PREVIOUSLY REPORTED AND CALLED VALUE.    Culture (A)  Final    STAPHYLOCOCCUS AUREUS SUSCEPTIBILITIES PERFORMED ON PREVIOUS CULTURE WITHIN THE LAST 5 DAYS. Performed at [facility], [place] [address], [place], [place] [address]    Report Status 06/24/2020 FINAL  Final  Culture, blood (routine x 2)     Status: Abnormal   Collection Time: 06/21/20  3:10 PM   Specimen: BLOOD RIGHT  HAND  Result Value Ref Range Status   Specimen Description BLOOD RIGHT HAND  Final   Special Requests   Final    BOTTLES DRAWN AEROBIC ONLY Blood Culture results may not be optimal due to an inadequate volume of blood received in culture bottles   Culture  Setup Time   Final    GRAM POSITIVE COCCI IN CLUSTERS AEROBIC BOTTLE ONLY CRITICAL VALUE NOTED.  VALUE IS CONSISTENT WITH PREVIOUSLY REPORTED AND CALLED VALUE.    Culture (A)  Final    STAPHYLOCOCCUS AUREUS SUSCEPTIBILITIES PERFORMED ON PREVIOUS CULTURE WITHIN THE LAST 5 DAYS. Performed at [facility], [place] [address] [place], [place] [address]    Report Status 06/24/2020 FINAL  Final  Culture, blood (Routine X 2) w Reflex to ID Panel     Status: None   Collection Time: 06/23/20 12:57 PM   Specimen: BLOOD RIGHT ARM  Result Value Ref Range Status   Specimen Description BLOOD RIGHT ARM  Final   Special Requests   Final    BOTTLES DRAWN AEROBIC ONLY Blood Culture results may not be optimal due to an inadequate volume of blood received in culture bottles   Culture   Final    NO GROWTH 5 DAYS Performed at [facility], [place] [address] [place], [place] [address]    Report Status 06/28/2020 FINAL  Final  Culture, blood (Routine X 2) w Reflex to ID Panel     Status: None   Collection Time: 06/23/20  1:05 PM   Specimen: BLOOD RIGHT ARM  Result Value Ref Range Status   Specimen Description BLOOD RIGHT ARM  Final   Special Requests   Final    BOTTLES DRAWN  AEROBIC ONLY Blood Culture results may not be optimal due to an inadequate volume of blood received in culture bottles   Culture   Final    NO GROWTH 5 DAYS Performed at [facility], [place] [address] [place], [place] [address]    Report Status 06/28/2020 FINAL  Final     Labs: BNP (last 3 results) No results for input(s): BNP in the last 8760 hours. Basic Metabolic Panel: Recent Labs  Lab 06/25/20 0027 06/25/20 1025 06/26/20 0156 06/27/20 0034  06/28/20 0407 06/29/20 0113  NA 132* 135 134* 133* 135 132*  K 4.1 4.4 3.7 4.3 3.9 3.9  CL 97* 102 97* 97* 101 98  CO2 23  --  27 26 26 25   GLUCOSE 151* 153* 120* 138* 144* 150*  BUN 45* 43* 15 23 13 18   CREATININE 7.26* 7.50* 3.53* 4.74* 3.58* 5.12*  CALCIUM 8.7*  --  8.2* 8.7* 8.6* 8.8*   Liver Function Tests: No results for input(s): AST, ALT, ALKPHOS, BILITOT, PROT, ALBUMIN in the last 168 hours. No results for input(s): LIPASE, AMYLASE in the last 168 hours. No results for input(s): AMMONIA in the last 168 hours. CBC: Recent Labs  Lab 06/23/20 0111 06/25/20 1025 06/26/20 0156 06/27/20 0034 06/28/20 0407 06/29/20 0113  WBC 7.2  --  6.7 8.2 9.3 9.1  NEUTROABS 4.6  --   --   --   --   --   HGB 9.8* 10.9* 9.1* 9.6* 9.0* 8.8*  HCT 31.2* 32.0* 29.5* 31.3* 29.1* 28.7*  MCV 98.4  --  98.0 98.1 98.6 99.3  PLT 226  --  218 262 219 258   Cardiac Enzymes: No results for input(s): CKTOTAL, CKMB, CKMBINDEX, TROPONINI in the last 168 hours. BNP: Invalid input(s): POCBNP CBG: Recent Labs  Lab 06/28/20 0904 06/28/20 1132 06/28/20 1658 06/28/20 1957 06/29/20 1051  GLUCAP 128* 187* 149* 199* 126*   D-Dimer No results for input(s): DDIMER in the last 72 hours. Hgb A1c No results for input(s): HGBA1C in the last 72 hours. Lipid Profile No results for input(s): CHOL, HDL, LDLCALC, TRIG, CHOLHDL, LDLDIRECT in the last 72 hours. Thyroid function studies No results for input(s): TSH, T4TOTAL, T3FREE, THYROIDAB in the last 72 hours.  Invalid input(s): FREET3 Anemia work up No results for input(s): VITAMINB12, FOLATE, FERRITIN, TIBC, IRON, RETICCTPCT in the last 72 hours. Urinalysis    Component Value Date/Time   COLORURINE YELLOW 10/19/2015 1420   APPEARANCEUR CLEAR 10/19/2015 1420   LABSPEC 1.010 10/19/2015 1420   PHURINE 6.0 10/19/2015 1420   GLUCOSEU NEGATIVE 10/19/2015 1420   HGBUR TRACE (A) 10/19/2015 1420   BILIRUBINUR NEGATIVE 10/19/2015 1420   KETONESUR NEGATIVE  10/19/2015 1420   PROTEINUR NEGATIVE 10/19/2015 1420   UROBILINOGEN 0.2 12/04/2014 0655   NITRITE NEGATIVE 10/19/2015 1420   LEUKOCYTESUR NEGATIVE 10/19/2015 1420   Sepsis Labs Invalid input(s): PROCALCITONIN,  WBC,  LACTICIDVEN Microbiology Recent Results (from the past 240 hour(s))  Blood culture (routine x 2)     Status: Abnormal   Collection Time: 06/20/20  4:55 PM   Specimen: BLOOD RIGHT HAND  Result Value Ref Range Status   Specimen Description BLOOD RIGHT HAND  Final   Special Requests   Final    BOTTLES DRAWN AEROBIC AND ANAEROBIC Blood Culture results may not be optimal due to an inadequate volume of blood received in culture bottles   Culture  Setup Time   Final    GRAM POSITIVE COCCI IN CLUSTERS IN BOTH  AEROBIC AND ANAEROBIC BOTTLES CRITICAL VALUE NOTED.  VALUE IS CONSISTENT WITH PREVIOUSLY REPORTED AND CALLED VALUE.    Culture (A)  Final    STAPHYLOCOCCUS AUREUS SUSCEPTIBILITIES PERFORMED ON PREVIOUS CULTURE WITHIN THE LAST 5 DAYS. Performed at [facility], [place] [address] [place], [place] [address]    Report Status 06/23/2020 FINAL  Final  Resp Panel by RT-PCR (Flu A&B, Covid) Nasopharyngeal Swab     Status: None   Collection Time: 06/20/20  5:51 PM   Specimen: Nasopharyngeal Swab; Nasopharyngeal(NP) swabs in vial transport medium  Result Value Ref Range Status   SARS Coronavirus 2 by RT PCR NEGATIVE NEGATIVE Final    Comment: (NOTE) SARS-CoV-2 target nucleic acids are NOT DETECTED.  The SARS-CoV-2 RNA is generally detectable in upper respiratory specimens during the acute phase of infection. The lowest concentration of SARS-CoV-2 viral copies this assay can detect is 138 copies/mL. A negative result does not preclude SARS-Cov-2 infection and should not be used as the sole basis for treatment or other patient management decisions. A negative result may occur with  improper specimen collection/handling, submission of specimen other than nasopharyngeal  swab, presence of viral mutation(s) within the areas targeted by this assay, and inadequate number of viral copies(<138 copies/mL). A negative result must be combined with clinical observations, patient history, and epidemiological information. The expected result is Negative.  Fact Sheet for Patients:  EntrepreneurPulse.com.au  Fact Sheet for Healthcare Providers:  IncredibleEmployment.be  This test is no t yet approved or cleared by the [place] FDA and  has been authorized for detection and/or diagnosis of SARS-CoV-2 by FDA under an Emergency Use Authorization (EUA). This EUA will remain  in effect (meaning this test can be used) for the duration of the COVID-19 declaration under Section 564(b)(1) of the Act, 21 U.S.C.section 360bbb-3(b)(1), unless the authorization is terminated  or revoked sooner.       Influenza A by PCR NEGATIVE NEGATIVE Final   Influenza B by PCR NEGATIVE NEGATIVE Final    Comment: (NOTE) The Xpert Xpress SARS-CoV-2/FLU/RSV plus assay is intended as an aid in the diagnosis of influenza from Nasopharyngeal swab specimens and should not be used as a sole basis for treatment. Nasal washings and aspirates are unacceptable for Xpert Xpress SARS-CoV-2/FLU/RSV testing.  Fact Sheet for Patients: EntrepreneurPulse.com.au  Fact Sheet for Healthcare Providers: IncredibleEmployment.be  This test is not yet approved or cleared by the [place] FDA and has been authorized for detection and/or diagnosis of SARS-CoV-2 by FDA under an Emergency Use Authorization (EUA). This EUA will remain in effect (meaning this test can be used) for the duration of the COVID-19 declaration under Section 564(b)(1) of the Act, 21 U.S.C. section 360bbb-3(b)(1), unless the authorization is terminated or revoked.  Performed at [facility], [place] [address], [place], [place] [address]   Blood  culture (routine x 2)     Status: Abnormal   Collection Time: 06/20/20  6:25 PM   Specimen: Right Antecubital; Blood  Result Value Ref Range Status   Specimen Description RIGHT ANTECUBITAL  Final   Special Requests   Final    BOTTLES DRAWN AEROBIC ONLY Blood Culture adequate volume   Culture  Setup Time   Final    GRAM POSITIVE COCCI IN CLUSTERS AEROBIC BOTTLE ONLY CRITICAL RESULT CALLED TO, READ BACK BY AND VERIFIED WITH: PHARMD M.YATES AT 7741 ON 06/21/2020 BY T.SAAD. Performed at [facility], [place] [address], [place],  [address]  Culture STAPHYLOCOCCUS AUREUS (A)  Final   Report Status 06/23/2020 FINAL  Final   Organism ID, Bacteria STAPHYLOCOCCUS AUREUS  Final      Susceptibility   Staphylococcus aureus - MIC*    CIPROFLOXACIN <=0.5 SENSITIVE Sensitive     ERYTHROMYCIN RESISTANT Resistant     GENTAMICIN <=0.5 SENSITIVE Sensitive     OXACILLIN 0.5 SENSITIVE Sensitive     TETRACYCLINE <=1 SENSITIVE Sensitive     VANCOMYCIN <=0.5 SENSITIVE Sensitive     TRIMETH/SULFA <=10 SENSITIVE Sensitive     CLINDAMYCIN RESISTANT Resistant     RIFAMPIN <=0.5 SENSITIVE Sensitive     Inducible Clindamycin POSITIVE Resistant     * STAPHYLOCOCCUS AUREUS  Blood Culture ID Panel (Reflexed)     Status: Abnormal   Collection Time: 06/20/20  6:25 PM  Result Value Ref Range Status   Enterococcus faecalis NOT DETECTED NOT DETECTED Final   Enterococcus Faecium NOT DETECTED NOT DETECTED Final   Listeria monocytogenes NOT DETECTED NOT DETECTED Final   Staphylococcus species DETECTED (A) NOT DETECTED Final   Staphylococcus aureus (BCID) DETECTED (A) NOT DETECTED Final    Comment: CRITICAL RESULT CALLED TO, READ BACK BY AND VERIFIED WITH: M. PHAM PHARMD AT 1915 06/21/20 BY D. VANHOOK    Staphylococcus epidermidis NOT DETECTED NOT DETECTED Final   Staphylococcus lugdunensis NOT DETECTED NOT DETECTED Final   Streptococcus species NOT DETECTED NOT DETECTED Final   Streptococcus agalactiae  NOT DETECTED NOT DETECTED Final   Streptococcus pneumoniae NOT DETECTED NOT DETECTED Final   Streptococcus pyogenes NOT DETECTED NOT DETECTED Final   A.calcoaceticus-baumannii NOT DETECTED NOT DETECTED Final   Bacteroides fragilis NOT DETECTED NOT DETECTED Final   Enterobacterales NOT DETECTED NOT DETECTED Final   Enterobacter cloacae complex NOT DETECTED NOT DETECTED Final   Escherichia coli NOT DETECTED NOT DETECTED Final   Klebsiella aerogenes NOT DETECTED NOT DETECTED Final   Klebsiella oxytoca NOT DETECTED NOT DETECTED Final   Klebsiella pneumoniae NOT DETECTED NOT DETECTED Final   Proteus species NOT DETECTED NOT DETECTED Final   Salmonella species NOT DETECTED NOT DETECTED Final   Serratia marcescens NOT DETECTED NOT DETECTED Final   Haemophilus influenzae NOT DETECTED NOT DETECTED Final   Neisseria meningitidis NOT DETECTED NOT DETECTED Final   Pseudomonas aeruginosa NOT DETECTED NOT DETECTED Final   Stenotrophomonas maltophilia NOT DETECTED NOT DETECTED Final   Candida albicans NOT DETECTED NOT DETECTED Final   Candida auris NOT DETECTED NOT DETECTED Final   Candida glabrata NOT DETECTED NOT DETECTED Final   Candida krusei NOT DETECTED NOT DETECTED Final   Candida parapsilosis NOT DETECTED NOT DETECTED Final   Candida tropicalis NOT DETECTED NOT DETECTED Final   Cryptococcus neoformans/gattii NOT DETECTED NOT DETECTED Final   Meth resistant mecA/C and MREJ NOT DETECTED NOT DETECTED Final    Comment: Performed at [facility], [address], [place], [place] [address]  Culture, blood (routine x 2)     Status: Abnormal   Collection Time: 06/21/20  3:01 PM   Specimen: BLOOD  Result Value Ref Range Status   Specimen Description BLOOD LEFT ANTECUBITAL  Final   Special Requests   Final    BOTTLES DRAWN AEROBIC ONLY Blood Culture results may not be optimal due to an inadequate volume of blood received in culture bottles   Culture  Setup Time   Final    GRAM POSITIVE COCCI  IN CLUSTERS AEROBIC BOTTLE ONLY CRITICAL VALUE NOTED.  VALUE IS CONSISTENT WITH PREVIOUSLY REPORTED AND CALLED  VALUE.    Culture (A)  Final    STAPHYLOCOCCUS AUREUS SUSCEPTIBILITIES PERFORMED ON PREVIOUS CULTURE WITHIN THE LAST 5 DAYS. Performed at [facility], [place] [address], [place], [place] [address]    Report Status 06/24/2020 FINAL  Final  Culture, blood (routine x 2)     Status: Abnormal   Collection Time: 06/21/20  3:10 PM   Specimen: BLOOD RIGHT HAND  Result Value Ref Range Status   Specimen Description BLOOD RIGHT HAND  Final   Special Requests   Final    BOTTLES DRAWN AEROBIC ONLY Blood Culture results may not be optimal due to an inadequate volume of blood received in culture bottles   Culture  Setup Time   Final    GRAM POSITIVE COCCI IN CLUSTERS AEROBIC BOTTLE ONLY CRITICAL VALUE NOTED.  VALUE IS CONSISTENT WITH PREVIOUSLY REPORTED AND CALLED VALUE.    Culture (A)  Final    STAPHYLOCOCCUS AUREUS SUSCEPTIBILITIES PERFORMED ON PREVIOUS CULTURE WITHIN THE LAST 5 DAYS. Performed at [facility], [place] [address], [place], [place] [address]    Report Status 06/24/2020 FINAL  Final  Culture, blood (Routine X 2) w Reflex to ID Panel     Status: None   Collection Time: 06/23/20 12:57 PM   Specimen: BLOOD RIGHT ARM  Result Value Ref Range Status   Specimen Description BLOOD RIGHT ARM  Final   Special Requests   Final    BOTTLES DRAWN AEROBIC ONLY Blood Culture results may not be optimal due to an inadequate volume of blood received in culture bottles   Culture   Final    NO GROWTH 5 DAYS Performed at [facility], [place] [address] [place], [place] [address]    Report Status 06/28/2020 FINAL  Final  Culture, blood (Routine X 2) w Reflex to ID Panel     Status: None   Collection Time: 06/23/20  1:05 PM   Specimen: BLOOD RIGHT ARM  Result Value Ref Range Status   Specimen Description BLOOD RIGHT ARM  Final   Special Requests   Final    BOTTLES DRAWN  AEROBIC ONLY Blood Culture results may not be optimal due to an inadequate volume of blood received in culture bottles   Culture   Final    NO GROWTH 5 DAYS Performed at [facility], [place] [address], [place], [place] [address]    Report Status 06/28/2020 FINAL  Final     Time coordinating discharge: Over 30 minutes  SIGNED:   Phillips Climes, MD  [facility] 06/29/2020, 1:18 PM Pager   If 7PM-7AM, please contact night-coverage [email or web address] Password [email or web address]

## 2020-06-29 NOTE — TOC Transition Note (Signed)
Transition of Care [facility]) - CM/SW Discharge Note   Patient Details  Name: [name] MRN: [id number] Date of Birth: 1940/10/30  Transition of [facility]) CM/SW Contact:  Carles Collet, RN Phone Number: 06/29/2020, 1:38 PM   Clinical Narrative:    [name] w patient's daughter. She confirms patient is active w [facility] for [facility] services, orders placed and weekend liaison notified to resume services. [name] declines any new DME needs. No other CM needs identified.      Amos,Marsha Daughter [phone]  [phone]       Final next level of care: Home w Home Health Services Barriers to Discharge: No Barriers Identified   Patient Goals and CMS Choice Patient states their goals for this hospitalization and ongoing recovery are:: to go home CMS Medicare.gov Compare Post Acute Care list provided to:: Other (Comment Required) Choice offered to / list presented to : Adult Children  Discharge Placement                       Discharge Plan and Services   Discharge Planning Services: CM Consult Post Acute Care Choice: Resumption of Svcs/PTA Provider          DME Arranged: N/A         HH Arranged: RN,PT,OT [place] Agency: [place] ([facility]) Date [place]: 06/29/20 Time [place]: 1338 Representative spoke with at [place]: [place] Determinants of Health ([place]) Interventions     Readmission Risk Interventions No flowsheet data found.

## 2020-06-29 NOTE — Progress Notes (Signed)
[facility] Progress Note   Subjective:    Seen in HD unit. Tolerating UF, slept through most of treatment. No complaints this am.    Objective Vitals:   06/29/20 0649 06/29/20 0700 06/29/20 0730 06/29/20 0800  BP: (!) 103/53 (!) 114/56 (!) 89/45 (!) 102/52  Pulse: (!) 53 (!) 52 (!) 55 (!) 56  Resp:      Temp:      TempSrc:      SpO2:      Weight:      Height:       Physical Exam General:Well developed female, alert and in NAD Heart:RRR, no murmurs, rubs or gallops Lungs:CTA bilaterally without wheezing, rhonchi or rales Abdomen:Soft, non-distended, +BS, + ostomy Extremities:no edema bilateral lower extremities Dialysis Access:NewL IJTDC, dsg c,d,i   Additional Objective Labs: Basic Metabolic Panel: Recent Labs  Lab 06/27/20 0034 06/28/20 0407 06/29/20 0113  NA 133* 135 132*  K 4.3 3.9 3.9  CL 97* 101 98  CO2 26 26 25   GLUCOSE 138* 144* 150*  BUN 23 13 18   CREATININE 4.74* 3.58* 5.12*  CALCIUM 8.7* 8.6* 8.8*   Liver Function Tests: No results for input(s): AST, ALT, ALKPHOS, BILITOT, PROT, ALBUMIN in the last 168 hours. No results for input(s): LIPASE, AMYLASE in the last 168 hours. CBC: Recent Labs  Lab 06/23/20 0111 06/25/20 1025 06/26/20 0156 06/27/20 0034 06/28/20 0407 06/29/20 0113  WBC 7.2  --  6.7 8.2 9.3 9.1  NEUTROABS 4.6  --   --   --   --   --   HGB 9.8*   < > 9.1* 9.6* 9.0* 8.8*  HCT 31.2*   < > 29.5* 31.3* 29.1* 28.7*  MCV 98.4  --  98.0 98.1 98.6 99.3  PLT 226  --  218 262 219 258   < > = values in this interval not displayed.   Blood Culture    Component Value Date/Time   SDES BLOOD RIGHT ARM 06/23/2020 1305   SPECREQUEST  06/23/2020 1305    BOTTLES DRAWN AEROBIC ONLY Blood Culture results may not be optimal due to an inadequate volume of blood received in culture bottles   CULT  06/23/2020 1305    NO GROWTH 5 DAYS Performed at [facility], [place] [address] [place], [place] [address]    REPTSTATUS  06/28/2020 FINAL 06/23/2020 1305    Cardiac Enzymes: No results for input(s): CKTOTAL, CKMB, CKMBINDEX, TROPONINI in the last 168 hours. CBG: Recent Labs  Lab 06/27/20 2019 06/28/20 0904 06/28/20 1132 06/28/20 1658 06/28/20 1957  GLUCAP 146* 128* 187* 149* 199*   Iron Studies: No results for input(s): IRON, TIBC, TRANSFERRIN, FERRITIN in the last 72 hours. @lablastinr3 @ Studies/Results: ECHO TEE  Result Date: 06/28/2020    TRANSESOPHOGEAL ECHO REPORT   Patient Name:   [name] Date of Exam: 06/28/2020 Medical Rec #:  [id number]        Height:       63.0 in Accession #:    [id number]       Weight:       213.8 lb Date of Birth:  Jun 19, 1940        BSA:          1.989 m Patient Age:    80 years         BP:           102/42 mmHg Patient Gender: F  HR:           52 bpm. Exam Location:  Inpatient Procedure: 2D Echo, Cardiac Doppler, Color Doppler and 3D Echo Indications:    Bacteremia  History:        Patient has prior history of Echocardiogram examinations, most                 recent 06/23/2020. CHF, Previous Myocardial Infarction,                 Arrythmias:Atrial Fibrillation; Risk Factors:Dyslipidemia and                 Diabetes. ESRD.  Sonographer:    Clayton Lefort RDCS (AE) Referring Phys: [place]: After discussion of the risks and benefits of a TEE, an informed consent was obtained from the patient. TEE procedure time was 14 minutes. The transesophogeal probe was passed without difficulty through the esophogus of the patient. Imaged were obtained with the patient in a left lateral decubitus position. Sedation performed by different physician. The patient was monitored while under deep sedation. Anesthestetic sedation was provided intravenously by Anesthesiology: 300mg  of Propofol. Image quality was excellent. The patient's vital signs; including heart rate, blood pressure, and oxygen saturation; remained stable throughout the procedure. The patient  developed no complications during the procedure. IMPRESSIONS  1. No evidence of endocarditis. There was very minimal bubbles crossing the IAS on bubble study, consistent with a trivial PFO.  2. Left ventricular ejection fraction, by estimation, is 50 to 55%. The left ventricle has low normal function. The left ventricle has no regional wall motion abnormalities.  3. Right ventricular systolic function is normal. The right ventricular size is normal.  4. Left atrial size was mild to moderately dilated. No left atrial/left atrial appendage thrombus was detected. The LAA emptying velocity was 54 cm/s.  5. A small pericardial effusion is present. The pericardial effusion is circumferential. There is no evidence of cardiac tamponade.  6. The mitral valve is grossly normal. Trivial mitral valve regurgitation. No evidence of mitral stenosis.  7. The aortic valve is tricuspid. There is mild calcification of the aortic valve. There is mild thickening of the aortic valve. Aortic valve regurgitation is not visualized. Mild aortic valve sclerosis is present, with no evidence of aortic valve stenosis.  8. There is mild (Grade II) layered plaque involving the transverse aorta and descending aorta.  9. Agitated saline contrast bubble study was positive with shunting observed within 3-6 cardiac cycles suggestive of interatrial shunt. There is a small patent foramen ovale with predominantly right to left shunting across the atrial septum. Conclusion(s)/Recommendation(s): No evidence of vegetation/infective endocarditis on this transesophageal echocardiogram. FINDINGS  Left Ventricle: Left ventricular ejection fraction, by estimation, is 50 to 55%. The left ventricle has low normal function. The left ventricle has no regional wall motion abnormalities. The left ventricular internal cavity size was normal in size. Right Ventricle: The right ventricular size is normal. No increase in right ventricular wall thickness. Right ventricular  systolic function is normal. Left Atrium: Left atrial size was mild to moderately dilated. No left atrial/left atrial appendage thrombus was detected. The LAA emptying velocity was 54 cm/s. Right Atrium: Right atrial size was normal in size. Pericardium: A small pericardial effusion is present. The pericardial effusion is circumferential. There is no evidence of cardiac tamponade. Mitral Valve: The mitral valve is grossly normal. Mild mitral annular calcification. Trivial mitral valve regurgitation. No evidence of mitral valve stenosis. There is no  evidence of mitral valve vegetation. Tricuspid Valve: The tricuspid valve is grossly normal. Tricuspid valve regurgitation is trivial. No evidence of tricuspid stenosis. There is no evidence of tricuspid valve vegetation. Aortic Valve: The aortic valve is tricuspid. There is mild calcification of the aortic valve. There is mild thickening of the aortic valve. Aortic valve regurgitation is not visualized. Mild aortic valve sclerosis is present, with no evidence of aortic valve stenosis. There is no evidence of aortic valve vegetation. Pulmonic Valve: The pulmonic valve was grossly normal. Pulmonic valve regurgitation is trivial. No evidence of pulmonic stenosis. There is no evidence of pulmonic valve vegetation. Aorta: The aortic root and ascending aorta are structurally normal, with no evidence of dilitation. There is mild (Grade II) layered plaque involving the transverse aorta and descending aorta. Venous: The left upper pulmonary vein, left lower pulmonary vein, right lower pulmonary vein and right upper pulmonary vein are normal. A systolic blunting flow pattern is recorded from the left upper pulmonary vein. IAS/Shunts: The atrial septum is grossly normal. Agitated saline contrast was given intravenously to evaluate for intracardiac shunting. Agitated saline contrast bubble study was positive with shunting observed within 3-6 cardiac cycles suggestive of interatrial  shunt. A small patent foramen ovale is detected with predominantly right to left shunting across the atrial septum.  AORTIC VALVE LVOT Vmax:   51.04 cm/s LVOT Vmean:  38.803 cm/s LVOT VTI:    0.131 m  AORTA Ao Root diam: 3.20 cm  SHUNTS Systemic VTI: 0.13 m Allison Chiquito MD Electronically signed by Allison Chiquito MD Signature Date/Time: 06/28/2020/1:36:49 PM    Final    Medications: .  ceFAZolin (ANCEF) IV     . amiodarone  200 mg Oral Daily  . Chlorhexidine Gluconate Cloth  6 each Topical Daily  . clonazePAM  1 mg Oral Daily  . darbepoetin (ARANESP) injection - DIALYSIS  60 mcg Intravenous Q Sat-HD  . estradiol  1 mg Oral Daily  . famotidine  20 mg Oral Q2000  . heparin injection (subcutaneous)  5,000 Units Subcutaneous Q8H  . insulin aspart  0-5 Units Subcutaneous QHS  . insulin aspart  0-6 Units Subcutaneous TID WC  . levothyroxine  175 mcg Oral QAC breakfast  . simvastatin  10 mg Oral QHS    OutpatientDialysis Orders: [facility] 3.5hrsTTS EDW98 kg, 2K, 2 CA bath,Heparin none. Accessdid have a right IJ PermCath(now removed)recent clotted left femoral AVGG Calcitriol 0.7 86mcg p.o./HD Mircera60MCG every 2 weeks last given 06/14/20  Assessment/Plan: 1. MSSA bacteremia(etiology thought to be a right IJ PermCath) --- ID consulted, currently on cefazolin;HD PermCath removed 5/27.BCx from 5/27 were still positive but redrawn 5/29 as pt is clinically much improved- negative to date.New TDC placed 5/31.TEE negative for vegetations. Continue cefazolin with HD for 6 weeks until 08/04/20.  Appreciate ID and VVS  2. ESRD -HD TTS,s/p line holiday, now back on regular schedule. Next HD 6/4 3. Hypertension/volume -originally hypotensive in ER given IV fluids. Currently BP stable, no excess volume on chest x-ray,edema improved. Under EDW and not tolerating much UF with HD. Hypotensive on HD 6/2. Minimal UF goal tomorrow. Pt reports increased urine output. 4. Anemia -Hgb9.0; redose ESA  with HD 6/4.  5. Metabolic bone disease -corrected calciumand phos controlled. Continue VDRA and binders 6. Proximal A. Fib-On amio. Eliquis on hold -- per primary/cards  7. History of chronic respiratory failure-currently no changes in oxygen requirements,no shortness of breath 8. Diabetes mellitus type 2 per admit 9. Debility and recent falls-PT OT consulting  Lynnda Child PA-C [facility] 06/29/2020,8:53 AM

## 2020-06-29 NOTE — Discharge Instructions (Signed)
Follow with Primary MD Wannetta Sender, FNP in 7 days   Get CBC, CMP, checked  by Primary MD next visit.    Activity: As tolerated with Full fall precautions use walker/cane & assistance as needed   Disposition Home    Diet: Heart Healthy/renal diet with 1200 cc fluid restriction.   On your next visit with your primary care physician please Get Medicines reviewed and adjusted.   Please request your Prim.MD to go over all Hospital Tests and Procedure/Radiological results at the follow up, please get all Hospital records sent to your Prim MD by signing hospital release before you go home.   If you experience worsening of your admission symptoms, develop shortness of breath, life threatening emergency, suicidal or homicidal thoughts you must seek medical attention immediately by calling 911 or calling your MD immediately  if symptoms less severe.  You Must read complete instructions/literature along with all the possible adverse reactions/side effects for all the Medicines you take and that have been prescribed to you. Take any new Medicines after you have completely understood and accpet all the possible adverse reactions/side effects.   Do not drive, operating heavy machinery, perform activities at heights, swimming or participation in water activities or provide baby sitting services if your were admitted for syncope or siezures until you have seen by Primary MD or a Neurologist and advised to do so again.  Do not drive when taking Pain medications.    Do not take more than prescribed Pain, Sleep and Anxiety Medications  Special Instructions: If you have smoked or chewed Tobacco  in the last 2 yrs please stop smoking, stop any regular Alcohol  and or any Recreational drug use.  Wear Seat belts while driving.   Please note  You were cared for by a hospitalist during your hospital stay. If you have any questions about your discharge medications or the care you received  while you were in the hospital after you are discharged, you can call the unit and asked to speak with the hospitalist on call if the hospitalist that took care of you is not available. Once you are discharged, your primary care physician will handle any further medical issues. Please note that NO REFILLS for any discharge medications will be authorized once you are discharged, as it is imperative that you return to your primary care physician (or establish a relationship with a primary care physician if you do not have one) for your aftercare needs so that they can reassess your need for medications and monitor your lab values.

## 2020-06-29 NOTE — Progress Notes (Signed)
[name] to be D/C'd Home per MD order.  Discussed with the patient and all questions fully answered.  VSS, Skin clean, dry and intact without evidence of skin break down, no evidence of skin tears noted. IV catheter discontinued intact. Site without signs and symptoms of complications. Dressing and pressure applied.  An After Visit Summary was printed and given to the patient. Patient received prescription.  D/c education completed with patient/family including follow up instructions, medication list, d/c activities limitations if indicated, with other d/c instructions as indicated by MD - patient able to verbalize understanding, all questions fully answered.   Patient instructed to return to ED, call 911, or call MD for any changes in condition.   Patient escorted via [place], and D/C home via private auto.  [place] 06/29/2020 6:59 PM

## 2020-07-01 ENCOUNTER — Encounter (HOSPITAL_COMMUNITY): Payer: Self-pay | Admitting: Cardiovascular Disease

## 2020-07-01 DIAGNOSIS — R7881 Bacteremia: Secondary | ICD-10-CM | POA: Insufficient documentation

## 2020-07-01 HISTORY — DX: Bacteremia: R78.81

## 2020-07-01 NOTE — TOC Transition Note (Signed)
Transition of care contact from inpatient facility  Date of discharge: 06/29/20 Date of contact: 07/01/20 Method: Phone Spoke to: Patient  At 4:15 pm today, patient contacted to discuss transition of care from recent inpatient hospitalization. Spoke with patient's daughter [name]. Patient was admitted to [facility] from 06/20/20-06/29/20 with discharge diagnosis of MSSA bacteremia. R IJ TDC was removed on 06/21/20 and US guided L IJ TDC placed on 06/25/20 via Dr. Scot Dock.  Medication changes were reviewed. Patient scheduled to receive IV Cefazolin on TTS after HD treatments until 08/04/20.  [name] reports being aware of her f/u appt with PCP in 1-2 weeks.  Patient will follow up with his/her outpatient HD unit on: Tuesday, July 02 2020.

## 2020-07-02 ENCOUNTER — Telehealth: Payer: Self-pay | Admitting: *Deleted

## 2020-07-02 NOTE — Telephone Encounter (Signed)
Received call from [facility] at [facility] asking for help. Patient was discharged with homehealth orders for PICC care. Patient getting antibiotics through hemodialysis catheter at [facility]. Patient's orders need to be changed from PICC care protocol to allow [facility] to provide hemodialysis catheter care. RN unable to see OPAT orders, requested [facility] page Dr [place] Bali for verbal order, provided pager number. Landis Gandy, RN

## 2020-07-10 ENCOUNTER — Telehealth: Payer: Self-pay | Admitting: Cardiology

## 2020-07-10 NOTE — Telephone Encounter (Signed)
Spoke to patient's daughter [name] she stated mother is now on dialysis.Stated her B/P has been dropping mid way through dialysis.Stated she was in hospital recently with a bacterial infection.Stated B/P was low while she was in hospital.She was told to see Dr.Jordan.Dr.Jordan is out of office this week.He has a cancellation on Monday.Appointment scheduled 6/20 at 2:00 pm.She will keep checking her B/P daily and  bring B/P readings to appointment.

## 2020-07-10 NOTE — Telephone Encounter (Signed)
PT's daughter is calling her mother went into dyalisis and her BP is very low and they could not get it to go back.She is wanting to get her mother seen .Advised next available is 12/02/2020. She is requesting a callback to see where her mother can be worked in at.
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
## 2020-07-15 ENCOUNTER — Ambulatory Visit (INDEPENDENT_AMBULATORY_CARE_PROVIDER_SITE_OTHER): Payer: Medicare HMO | Admitting: Cardiology

## 2020-07-15 ENCOUNTER — Other Ambulatory Visit: Payer: Self-pay

## 2020-07-15 ENCOUNTER — Encounter: Payer: Self-pay | Admitting: Cardiology

## 2020-07-15 VITALS — BP 116/70 | HR 60 | Ht 63.0 in | Wt 217.8 lb

## 2020-07-15 DIAGNOSIS — I480 Paroxysmal atrial fibrillation: Secondary | ICD-10-CM

## 2020-07-15 DIAGNOSIS — I2510 Atherosclerotic heart disease of native coronary artery without angina pectoris: Secondary | ICD-10-CM | POA: Diagnosis not present

## 2020-07-15 DIAGNOSIS — Z992 Dependence on renal dialysis: Secondary | ICD-10-CM

## 2020-07-15 DIAGNOSIS — N186 End stage renal disease: Secondary | ICD-10-CM | POA: Diagnosis not present

## 2020-07-15 DIAGNOSIS — I953 Hypotension of hemodialysis: Secondary | ICD-10-CM | POA: Diagnosis not present

## 2020-07-15 MED ORDER — MIDODRINE HCL 5 MG PO TABS: ORAL_TABLET | ORAL | 6 refills | Status: DC

## 2020-07-16 ENCOUNTER — Encounter (HOSPITAL_COMMUNITY): Payer: Self-pay

## 2020-07-16 ENCOUNTER — Inpatient Hospital Stay: Payer: Medicare HMO | Admitting: Infectious Diseases

## 2020-07-16 ENCOUNTER — Emergency Department (HOSPITAL_COMMUNITY)
Admission: EM | Admit: 2020-07-16 | Discharge: 2020-07-16 | Disposition: A | Discharge status: Home / Self Care | Payer: Medicare HMO | Attending: Emergency Medicine | Admitting: Emergency Medicine

## 2020-07-16 ENCOUNTER — Emergency Department (HOSPITAL_COMMUNITY): Payer: Medicare HMO

## 2020-07-16 ENCOUNTER — Other Ambulatory Visit: Payer: Self-pay

## 2020-07-16 DIAGNOSIS — Z85038 Personal history of other malignant neoplasm of large intestine: Secondary | ICD-10-CM | POA: Insufficient documentation

## 2020-07-16 DIAGNOSIS — I132 Hypertensive heart and chronic kidney disease with heart failure and with stage 5 chronic kidney disease, or end stage renal disease: Secondary | ICD-10-CM | POA: Insufficient documentation

## 2020-07-16 DIAGNOSIS — Z7951 Long term (current) use of inhaled steroids: Secondary | ICD-10-CM | POA: Diagnosis not present

## 2020-07-16 DIAGNOSIS — I5043 Acute on chronic combined systolic (congestive) and diastolic (congestive) heart failure: Secondary | ICD-10-CM | POA: Insufficient documentation

## 2020-07-16 DIAGNOSIS — I480 Paroxysmal atrial fibrillation: Secondary | ICD-10-CM | POA: Diagnosis not present

## 2020-07-16 DIAGNOSIS — Z794 Long term (current) use of insulin: Secondary | ICD-10-CM | POA: Insufficient documentation

## 2020-07-16 DIAGNOSIS — F0390 Unspecified dementia without behavioral disturbance: Secondary | ICD-10-CM | POA: Insufficient documentation

## 2020-07-16 DIAGNOSIS — I2510 Atherosclerotic heart disease of native coronary artery without angina pectoris: Secondary | ICD-10-CM | POA: Diagnosis not present

## 2020-07-16 DIAGNOSIS — J45909 Unspecified asthma, uncomplicated: Secondary | ICD-10-CM | POA: Diagnosis not present

## 2020-07-16 DIAGNOSIS — Z8616 Personal history of COVID-19: Secondary | ICD-10-CM | POA: Diagnosis not present

## 2020-07-16 DIAGNOSIS — D631 Anemia in chronic kidney disease: Secondary | ICD-10-CM | POA: Diagnosis not present

## 2020-07-16 DIAGNOSIS — M25562 Pain in left knee: Secondary | ICD-10-CM | POA: Insufficient documentation

## 2020-07-16 DIAGNOSIS — Z992 Dependence on renal dialysis: Secondary | ICD-10-CM | POA: Insufficient documentation

## 2020-07-16 DIAGNOSIS — Z8553 Personal history of malignant neoplasm of renal pelvis: Secondary | ICD-10-CM | POA: Insufficient documentation

## 2020-07-16 DIAGNOSIS — Z7901 Long term (current) use of anticoagulants: Secondary | ICD-10-CM | POA: Diagnosis not present

## 2020-07-16 DIAGNOSIS — E1129 Type 2 diabetes mellitus with other diabetic kidney complication: Secondary | ICD-10-CM | POA: Diagnosis not present

## 2020-07-16 DIAGNOSIS — N186 End stage renal disease: Secondary | ICD-10-CM | POA: Diagnosis not present

## 2020-07-16 DIAGNOSIS — X58XXXA Exposure to other specified factors, initial encounter: Secondary | ICD-10-CM | POA: Insufficient documentation

## 2020-07-16 DIAGNOSIS — M25561 Pain in right knee: Secondary | ICD-10-CM | POA: Diagnosis not present

## 2020-07-16 DIAGNOSIS — E1140 Type 2 diabetes mellitus with diabetic neuropathy, unspecified: Secondary | ICD-10-CM | POA: Diagnosis not present

## 2020-07-16 DIAGNOSIS — E1122 Type 2 diabetes mellitus with diabetic chronic kidney disease: Secondary | ICD-10-CM | POA: Insufficient documentation

## 2020-07-16 DIAGNOSIS — Z91040 Latex allergy status: Secondary | ICD-10-CM | POA: Diagnosis not present

## 2020-07-16 DIAGNOSIS — E039 Hypothyroidism, unspecified: Secondary | ICD-10-CM | POA: Diagnosis not present

## 2020-07-16 DIAGNOSIS — S82401A Unspecified fracture of shaft of right fibula, initial encounter for closed fracture: Secondary | ICD-10-CM | POA: Insufficient documentation

## 2020-07-16 DIAGNOSIS — S8991XA Unspecified injury of right lower leg, initial encounter: Secondary | ICD-10-CM | POA: Diagnosis present

## 2020-07-16 LAB — CBC WITH DIFFERENTIAL/PLATELET
Abs Immature Granulocytes: 0.04 10*3/uL (ref 0.00–0.07)
Basophils Absolute: 0.1 10*3/uL (ref 0.0–0.1)
Basophils Relative: 1 %
Eosinophils Absolute: 0 10*3/uL (ref 0.0–0.5)
Eosinophils Relative: 1 %
HCT: 34.1 % — ABNORMAL LOW (ref 36.0–46.0)
Hemoglobin: 10.2 g/dL — ABNORMAL LOW (ref 12.0–15.0)
Immature Granulocytes: 1 %
Lymphocytes Relative: 17 %
Lymphs Abs: 1 10*3/uL (ref 0.7–4.0)
MCH: 30 pg (ref 26.0–34.0)
MCHC: 29.9 g/dL — ABNORMAL LOW (ref 30.0–36.0)
MCV: 100.3 fL — ABNORMAL HIGH (ref 80.0–100.0)
Monocytes Absolute: 0.7 10*3/uL (ref 0.1–1.0)
Monocytes Relative: 12 %
Neutro Abs: 4 10*3/uL (ref 1.7–7.7)
Neutrophils Relative %: 68 %
Platelets: 190 10*3/uL (ref 150–400)
RBC: 3.4 MIL/uL — ABNORMAL LOW (ref 3.87–5.11)
RDW: 15 % (ref 11.5–15.5)
WBC: 5.8 10*3/uL (ref 4.0–10.5)
nRBC: 0 % (ref 0.0–0.2)

## 2020-07-16 LAB — PROTIME-INR
INR: 1.8 — ABNORMAL HIGH (ref 0.8–1.2)
Prothrombin Time: 21.3 seconds — ABNORMAL HIGH (ref 11.4–15.2)

## 2020-07-16 LAB — LACTIC ACID, PLASMA
Lactic Acid, Venous: 2.4 mmol/L (ref 0.5–1.9)
Lactic Acid, Venous: 2.5 mmol/L (ref 0.5–1.9)

## 2020-07-16 LAB — COMPREHENSIVE METABOLIC PANEL
ALT: <5 U/L (ref 0–44)
AST: 24 U/L (ref 15–41)
Albumin: 2.5 g/dL — ABNORMAL LOW (ref 3.5–5.0)
Alkaline Phosphatase: 109 U/L (ref 38–126)
Anion gap: 13 (ref 5–15)
BUN: 17 mg/dL (ref 8–23)
CO2: 27 mmol/L (ref 22–32)
Calcium: 8.5 mg/dL — ABNORMAL LOW (ref 8.9–10.3)
Chloride: 93 mmol/L — ABNORMAL LOW (ref 98–111)
Creatinine, Ser: 5.6 mg/dL — ABNORMAL HIGH (ref 0.44–1.00)
GFR, Estimated: 7 mL/min — ABNORMAL LOW (ref >60)
Glucose, Bld: 125 mg/dL — ABNORMAL HIGH (ref 70–99)
Potassium: 4 mmol/L (ref 3.5–5.1)
Sodium: 133 mmol/L — ABNORMAL LOW (ref 135–145)
Total Bilirubin: 0.7 mg/dL (ref 0.3–1.2)
Total Protein: 6.1 g/dL — ABNORMAL LOW (ref 6.5–8.1)

## 2020-07-16 LAB — MAGNESIUM: Magnesium: 1.7 mg/dL (ref 1.7–2.4)

## 2020-07-16 LAB — APTT: aPTT: 46 seconds — ABNORMAL HIGH (ref 24–36)

## 2020-07-16 MED ORDER — TRAMADOL HCL 50 MG PO TABS
50.0000 mg | ORAL_TABLET | Freq: Once | ORAL | Status: AC
  Administered 2020-07-16: 50 mg via ORAL
  Filled 2020-07-16: qty 1

## 2020-07-16 MED ORDER — TRAMADOL HCL 50 MG PO TABS: 50.0000 mg | ORAL_TABLET | Freq: Two times a day (BID) | ORAL | 0 refills | Status: AC

## 2020-07-16 NOTE — ED Notes (Signed)
RN reviewed discharge instructions w/ pt. Follow up, pain management, and prescriptions reviewed. Pt had no further questions 

## 2020-07-16 NOTE — ED Triage Notes (Signed)
Pt reports bilateral leg pain and weakness from her knees down, unable to walk, usually uses a walker and she is now using a wheelchair. States this happened 2 years ago and she was told it was gout. No significant swelling noted, pt able to move legs. Dialysis pt T/TH/SAT, had dialysis today. Pt a.o

## 2020-07-16 NOTE — Discharge Instructions (Addendum)
You have been seen and discharged from the emergency department.  Your x-ray shows a subacute fibular fracture on the right, both x-ray showed knee effusion.  Wear knee immobilizer, weight-bear as tolerated, ice and elevate the legs.  Follow-up with orthopedics next week.  Take pain medicine as prescribed.  Do not mix this medication with alcohol or other sedating medications. Do not drive or do heavy physical activity and to know how this medication affects you.  It may cause drowsiness.  Follow-up with your primary provider for reevaluation and further care. Take home medications as prescribed. If you have any worsening symptoms or further concerns for your health please return to an emergency department for further evaluation.

## 2020-07-16 NOTE — ED Provider Notes (Signed)
Emergency Medicine Provider Triage Evaluation Note  [name] , a 80 y.o. female  was evaluated in triage.  Pt complains of pain in her lower legs from the knees down bilaterally x1 week that is causing her pain when walking, using wheelchair amounts of her normal walker.  Additional endorses hypertension following dialysis sessions.  She is on dialysis Tuesday/Thursday/Saturday.  Had 2 hours of dialysis today along with IV antibiotic therapy for Recent diagnosis of bacteremia for which she was admitted to the hospital for 9 days in May 2022.  Has new rash on arms x3 weeks, not itchy or painful.  Anticoagulated on Eliquis.  Review of Systems  Positive: Leg pain bilaterally, shortness of breath with chronic oxygen, increased from baseline.  Rash on arms bilaterally x3 weeks. Negative: Fevers, chills, nausea, vomiting, abdominal pain, chest pain, palpitations  Physical Exam  BP 106/75 (BP Location: Right Arm)   Pulse (!) 58   Temp 97.6 F (36.4 C) (Oral)   Resp 16   SpO2 100%  Gen:   Awake, no distress   Resp:  Normal effort, on 2 L of oxygen by nasal cannula chronically. MSK:   Moves extremities without difficulty  Other:  2+ pitting pedal and lower extremity edema, diminished breath sounds with rales in the left lung base.  Erythematous macular rash to the dorsum of hands and forearms bilaterally. Medical Decision Making  Medically screening exam initiated at 2:31 PM.  Appropriate orders placed.  [name] was informed that the remainder of the evaluation will be completed by another provider, this initial triage assessment does not replace that evaluation, and the importance of remaining in the ED until their evaluation is complete.  This chart was dictated using voice recognition software, Dragon. Despite the best efforts of this provider to proofread and correct errors, errors may still occur which can change documentation meaning.    [name] 07/16/20  1437    Allison Chapel, MD 07/17/20 1028
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
## 2020-07-17 NOTE — Progress Notes (Signed)
Orthopedic Tech Progress Note Patient Details:  [name] 1940-04-09 [id number]  Ortho Devices Type of Ortho Device: Knee Immobilizer Ortho Device/Splint Location: RLE Ortho Device/Splint Interventions: Ordered, Adjustment, Application   Post Interventions Patient Tolerated: Well Instructions Provided: Adjustment of device, Care of device, Poper ambulation with device  Yana Schorr 07/17/2020, 12:33 AM

## 2020-07-22 ENCOUNTER — Inpatient Hospital Stay: Payer: Medicare HMO | Admitting: Internal Medicine

## 2020-07-31 ENCOUNTER — Other Ambulatory Visit (HOSPITAL_COMMUNITY): Payer: Self-pay | Admitting: Nurse Practitioner

## 2020-08-07 ENCOUNTER — Inpatient Hospital Stay: Payer: Medicare HMO | Admitting: Internal Medicine

## 2020-10-18 ENCOUNTER — Other Ambulatory Visit (HOSPITAL_COMMUNITY): Payer: Self-pay | Admitting: Nurse Practitioner

## 2020-10-18 NOTE — Telephone Encounter (Signed)
Prescription refill request for Eliquis received. Indication:atrial fib Last office visit:6/22 Scr:5.6 Age: 80 Weight:98.8 kg  Prescription refilled
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
## 2020-11-04 ENCOUNTER — Ambulatory Visit: Payer: Medicare HMO | Admitting: Cardiology

## 2020-11-08 ENCOUNTER — Telehealth: Payer: Self-pay | Admitting: Cardiology

## 2020-11-08 NOTE — Telephone Encounter (Signed)
Pt c/o BP issue: STAT if pt c/o blurred vision, one-sided weakness or slurred speech  1. What are your last 5 BP readings? Doesn't have   2. Are you having any other symptoms (ex. Dizziness, headache, blurred vision, passed out)? Headache on the right side   3. What is your BP issue?  blood pressure is dropping during dialysis and is hard to get back up, pt is also extremely tired and sob after and complaining of a headache on the side of her head. Dialysis Dr. says she needs to be seen as soon as possible as medication they are providing to pt is not increasing her blood pressure

## 2020-11-08 NOTE — Telephone Encounter (Signed)
OK, agree  [name]

## 2020-11-08 NOTE — Telephone Encounter (Signed)
Spoke to patient's daughter [name].She stated mother's B/P has been dropping at Dialysis.Stated they are having to give her more medication.Advised she should take Midodrine 5 mg on dialysis days.Stated she will make sure she is taking before she goes to dialysis.Stated she had to cancel mother's last appointment with Dr.Jordan.Stated after dialysis she is very weak,no energy,sob,pain in right side of head.Appointment scheduled with Dr.Jordan 10/27 at 3:00 pm.Advised I will make Dr.Jordan aware.
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
## 2020-11-21 ENCOUNTER — Ambulatory Visit (INDEPENDENT_AMBULATORY_CARE_PROVIDER_SITE_OTHER): Payer: Medicare HMO | Admitting: Cardiology

## 2020-11-21 ENCOUNTER — Encounter: Payer: Self-pay | Admitting: Cardiology

## 2020-11-21 ENCOUNTER — Other Ambulatory Visit: Payer: Self-pay

## 2020-11-21 VITALS — BP 107/61 | HR 86 | Ht 62.0 in | Wt 207.6 lb

## 2020-11-21 DIAGNOSIS — I2510 Atherosclerotic heart disease of native coronary artery without angina pectoris: Secondary | ICD-10-CM | POA: Diagnosis not present

## 2020-11-21 DIAGNOSIS — I480 Paroxysmal atrial fibrillation: Secondary | ICD-10-CM | POA: Diagnosis not present

## 2020-11-21 DIAGNOSIS — N186 End stage renal disease: Secondary | ICD-10-CM

## 2020-11-21 DIAGNOSIS — I10 Essential (primary) hypertension: Secondary | ICD-10-CM

## 2020-11-21 DIAGNOSIS — Z992 Dependence on renal dialysis: Secondary | ICD-10-CM
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
## 2020-12-30 ENCOUNTER — Telehealth: Payer: Self-pay | Admitting: Cardiology

## 2020-12-30 ENCOUNTER — Ambulatory Visit: Payer: Medicare HMO | Admitting: Cardiology

## 2020-12-30 NOTE — Telephone Encounter (Signed)
A user error has taken place: encounter opened in error, closed for administrative reasons.

## 2021-01-28 ENCOUNTER — Telehealth: Payer: Self-pay | Admitting: Cardiology

## 2021-01-28 NOTE — Telephone Encounter (Signed)
Pt c/o of Chest Pain: STAT if CP now or developed within 24 hours  1. Are you having CP right now? No   2. Are you experiencing any other symptoms (ex. SOB, nausea, vomiting, sweating)? SOB, pale in the face, and fatigue   3. How long have you been experiencing CP? Started 01/24/21   4. Is your CP continuous or coming and going? Coming and going   5. Have you taken Nitroglycerin? No    [name] is calling stating Chrys has been having CP since Friday 01/24/21. She also reports she has been vomiting since 01/25/21. She is unsure if the vomiting is a heart related issue or if it is due to her ostomy bag. She reports she has been having SOB and had to turn up her oxygen, as well as fatigue and is pale in the face. Cherolyn got her labs checked last week at dialysis, her potasium was 5.4 and her hemoglobin was 8 something. Desaree is currently at dialysis now, but she did not report CP when she spoke with her prior to her leaving for her appt. [name] is requesting a callback from [place] in regards to this.  ?

## 2021-01-28 NOTE — Telephone Encounter (Signed)
Spoke to patient's daughter [name] she stated mother has been having chest pain off and on since this past Friday.Stated she has been complaining of being tired,sob.Advised she needs to go to ED to be evaluated.

## 2021-02-05 ENCOUNTER — Ambulatory Visit (HOSPITAL_COMMUNITY): Payer: Medicare HMO | Admitting: Physician Assistant

## 2021-02-12 ENCOUNTER — Ambulatory Visit (HOSPITAL_COMMUNITY): Payer: Medicare HMO | Admitting: Physician Assistant

## 2021-02-15 ENCOUNTER — Emergency Department (HOSPITAL_COMMUNITY): Payer: Medicare HMO

## 2021-02-15 ENCOUNTER — Inpatient Hospital Stay (HOSPITAL_COMMUNITY)
Admission: EM | Admit: 2021-02-15 | Discharge: 2021-02-20 | DRG: 377 | Disposition: A | Discharge status: Home / Self Care | Payer: Medicare HMO | Attending: Family Medicine | Admitting: Family Medicine

## 2021-02-15 ENCOUNTER — Encounter (HOSPITAL_COMMUNITY): Payer: Self-pay | Admitting: Emergency Medicine

## 2021-02-15 ENCOUNTER — Other Ambulatory Visit: Payer: Self-pay

## 2021-02-15 DIAGNOSIS — Z8041 Family history of malignant neoplasm of ovary: Secondary | ICD-10-CM

## 2021-02-15 DIAGNOSIS — F411 Generalized anxiety disorder: Secondary | ICD-10-CM | POA: Diagnosis present

## 2021-02-15 DIAGNOSIS — Z933 Colostomy status: Secondary | ICD-10-CM

## 2021-02-15 DIAGNOSIS — I5042 Chronic combined systolic (congestive) and diastolic (congestive) heart failure: Secondary | ICD-10-CM | POA: Diagnosis present

## 2021-02-15 DIAGNOSIS — I3139 Other pericardial effusion (noninflammatory): Secondary | ICD-10-CM | POA: Diagnosis present

## 2021-02-15 DIAGNOSIS — Z8616 Personal history of COVID-19: Secondary | ICD-10-CM

## 2021-02-15 DIAGNOSIS — C186 Malignant neoplasm of descending colon: Secondary | ICD-10-CM

## 2021-02-15 DIAGNOSIS — E785 Hyperlipidemia, unspecified: Secondary | ICD-10-CM | POA: Diagnosis present

## 2021-02-15 DIAGNOSIS — Z85528 Personal history of other malignant neoplasm of kidney: Secondary | ICD-10-CM

## 2021-02-15 DIAGNOSIS — Z6837 Body mass index (BMI) 37.0-37.9, adult: Secondary | ICD-10-CM

## 2021-02-15 DIAGNOSIS — C182 Malignant neoplasm of ascending colon: Secondary | ICD-10-CM

## 2021-02-15 DIAGNOSIS — Z85038 Personal history of other malignant neoplasm of large intestine: Secondary | ICD-10-CM

## 2021-02-15 DIAGNOSIS — Z20822 Contact with and (suspected) exposure to covid-19: Secondary | ICD-10-CM | POA: Diagnosis present

## 2021-02-15 DIAGNOSIS — Z992 Dependence on renal dialysis: Secondary | ICD-10-CM

## 2021-02-15 DIAGNOSIS — Z90710 Acquired absence of both cervix and uterus: Secondary | ICD-10-CM

## 2021-02-15 DIAGNOSIS — L8990 Pressure ulcer of unspecified site, unspecified stage: Secondary | ICD-10-CM | POA: Insufficient documentation

## 2021-02-15 DIAGNOSIS — Z1509 Genetic susceptibility to other malignant neoplasm: Secondary | ICD-10-CM

## 2021-02-15 DIAGNOSIS — I428 Other cardiomyopathies: Secondary | ICD-10-CM | POA: Diagnosis present

## 2021-02-15 DIAGNOSIS — K449 Diaphragmatic hernia without obstruction or gangrene: Secondary | ICD-10-CM | POA: Diagnosis present

## 2021-02-15 DIAGNOSIS — K5730 Diverticulosis of large intestine without perforation or abscess without bleeding: Secondary | ICD-10-CM | POA: Diagnosis present

## 2021-02-15 DIAGNOSIS — Z794 Long term (current) use of insulin: Secondary | ICD-10-CM

## 2021-02-15 DIAGNOSIS — J9610 Chronic respiratory failure, unspecified whether with hypoxia or hypercapnia: Secondary | ICD-10-CM | POA: Diagnosis present

## 2021-02-15 DIAGNOSIS — E1122 Type 2 diabetes mellitus with diabetic chronic kidney disease: Secondary | ICD-10-CM | POA: Diagnosis present

## 2021-02-15 DIAGNOSIS — Z9049 Acquired absence of other specified parts of digestive tract: Secondary | ICD-10-CM

## 2021-02-15 DIAGNOSIS — E669 Obesity, unspecified: Secondary | ICD-10-CM | POA: Diagnosis present

## 2021-02-15 DIAGNOSIS — Z880 Allergy status to penicillin: Secondary | ICD-10-CM

## 2021-02-15 DIAGNOSIS — Z7901 Long term (current) use of anticoagulants: Secondary | ICD-10-CM

## 2021-02-15 DIAGNOSIS — N186 End stage renal disease: Secondary | ICD-10-CM

## 2021-02-15 DIAGNOSIS — I252 Old myocardial infarction: Secondary | ICD-10-CM

## 2021-02-15 DIAGNOSIS — Z888 Allergy status to other drugs, medicaments and biological substances status: Secondary | ICD-10-CM

## 2021-02-15 DIAGNOSIS — D649 Anemia, unspecified: Secondary | ICD-10-CM

## 2021-02-15 DIAGNOSIS — K644 Residual hemorrhoidal skin tags: Secondary | ICD-10-CM | POA: Diagnosis present

## 2021-02-15 DIAGNOSIS — D500 Iron deficiency anemia secondary to blood loss (chronic): Secondary | ICD-10-CM

## 2021-02-15 DIAGNOSIS — K3184 Gastroparesis: Secondary | ICD-10-CM | POA: Diagnosis present

## 2021-02-15 DIAGNOSIS — R000 Tachycardia, unspecified: Secondary | ICD-10-CM | POA: Diagnosis not present

## 2021-02-15 DIAGNOSIS — I132 Hypertensive heart and chronic kidney disease with heart failure and with stage 5 chronic kidney disease, or end stage renal disease: Secondary | ICD-10-CM | POA: Diagnosis present

## 2021-02-15 DIAGNOSIS — I2510 Atherosclerotic heart disease of native coronary artery without angina pectoris: Secondary | ICD-10-CM | POA: Diagnosis present

## 2021-02-15 DIAGNOSIS — E039 Hypothyroidism, unspecified: Secondary | ICD-10-CM | POA: Diagnosis present

## 2021-02-15 DIAGNOSIS — I953 Hypotension of hemodialysis: Secondary | ICD-10-CM | POA: Diagnosis present

## 2021-02-15 DIAGNOSIS — Z86010 Personal history of colonic polyps: Secondary | ICD-10-CM

## 2021-02-15 DIAGNOSIS — D631 Anemia in chronic kidney disease: Secondary | ICD-10-CM | POA: Diagnosis present

## 2021-02-15 DIAGNOSIS — D62 Acute posthemorrhagic anemia: Secondary | ICD-10-CM | POA: Diagnosis present

## 2021-02-15 DIAGNOSIS — I440 Atrioventricular block, first degree: Secondary | ICD-10-CM | POA: Diagnosis present

## 2021-02-15 DIAGNOSIS — Z881 Allergy status to other antibiotic agents status: Secondary | ICD-10-CM

## 2021-02-15 DIAGNOSIS — I4892 Unspecified atrial flutter: Secondary | ICD-10-CM | POA: Diagnosis present

## 2021-02-15 DIAGNOSIS — Z79899 Other long term (current) drug therapy: Secondary | ICD-10-CM

## 2021-02-15 DIAGNOSIS — E1143 Type 2 diabetes mellitus with diabetic autonomic (poly)neuropathy: Secondary | ICD-10-CM | POA: Diagnosis present

## 2021-02-15 DIAGNOSIS — Z885 Allergy status to narcotic agent status: Secondary | ICD-10-CM

## 2021-02-15 DIAGNOSIS — Z539 Procedure and treatment not carried out, unspecified reason: Secondary | ICD-10-CM | POA: Diagnosis not present

## 2021-02-15 DIAGNOSIS — R0789 Other chest pain: Secondary | ICD-10-CM | POA: Diagnosis not present

## 2021-02-15 DIAGNOSIS — M1990 Unspecified osteoarthritis, unspecified site: Secondary | ICD-10-CM | POA: Diagnosis present

## 2021-02-15 DIAGNOSIS — R079 Chest pain, unspecified: Secondary | ICD-10-CM

## 2021-02-15 DIAGNOSIS — Z8371 Family history of colonic polyps: Secondary | ICD-10-CM

## 2021-02-15 DIAGNOSIS — Z9981 Dependence on supplemental oxygen: Secondary | ICD-10-CM

## 2021-02-15 DIAGNOSIS — Z800 Family history of malignant neoplasm of digestive organs: Secondary | ICD-10-CM

## 2021-02-15 DIAGNOSIS — Z91040 Latex allergy status: Secondary | ICD-10-CM

## 2021-02-15 DIAGNOSIS — K9411 Enterostomy hemorrhage: Secondary | ICD-10-CM | POA: Diagnosis present

## 2021-02-15 DIAGNOSIS — Z91048 Other nonmedicinal substance allergy status: Secondary | ICD-10-CM

## 2021-02-15 DIAGNOSIS — N189 Chronic kidney disease, unspecified: Secondary | ICD-10-CM | POA: Diagnosis present

## 2021-02-15 DIAGNOSIS — Z905 Acquired absence of kidney: Secondary | ICD-10-CM

## 2021-02-15 DIAGNOSIS — Z91018 Allergy to other foods: Secondary | ICD-10-CM

## 2021-02-15 DIAGNOSIS — K219 Gastro-esophageal reflux disease without esophagitis: Secondary | ICD-10-CM | POA: Diagnosis present

## 2021-02-15 DIAGNOSIS — K5521 Angiodysplasia of colon with hemorrhage: Principal | ICD-10-CM

## 2021-02-15 DIAGNOSIS — Z833 Family history of diabetes mellitus: Secondary | ICD-10-CM

## 2021-02-15 DIAGNOSIS — Z8249 Family history of ischemic heart disease and other diseases of the circulatory system: Secondary | ICD-10-CM

## 2021-02-15 DIAGNOSIS — Z803 Family history of malignant neoplasm of breast: Secondary | ICD-10-CM

## 2021-02-15 DIAGNOSIS — K648 Other hemorrhoids: Secondary | ICD-10-CM | POA: Diagnosis present

## 2021-02-15 DIAGNOSIS — I4819 Other persistent atrial fibrillation: Secondary | ICD-10-CM | POA: Diagnosis present

## 2021-02-15 DIAGNOSIS — Z79890 Hormone replacement therapy (postmenopausal): Secondary | ICD-10-CM

## 2021-02-15 DIAGNOSIS — J45909 Unspecified asthma, uncomplicated: Secondary | ICD-10-CM | POA: Diagnosis present

## 2021-02-15 DIAGNOSIS — Z8051 Family history of malignant neoplasm of kidney: Secondary | ICD-10-CM

## 2021-02-15 HISTORY — DX: Supraventricular tachycardia, unspecified: I47.10

## 2021-02-15 HISTORY — DX: Patent foramen ovale: Q21.12

## 2021-02-15 HISTORY — DX: Supraventricular tachycardia: I47.1

## 2021-02-15 HISTORY — DX: Iron deficiency anemia secondary to blood loss (chronic): D50.0

## 2021-02-15 HISTORY — DX: Sepsis due to methicillin susceptible Staphylococcus aureus: A41.01

## 2021-02-15 HISTORY — DX: Chronic combined systolic (congestive) and diastolic (congestive) heart failure: I50.42

## 2021-02-15 HISTORY — DX: Chronic respiratory failure, unspecified whether with hypoxia or hypercapnia: J96.10

## 2021-02-15 HISTORY — DX: Genetic susceptibility to other malignant neoplasm: Z15.09

## 2021-02-15 HISTORY — DX: Malignant neoplasm of urinary organ, unspecified: C68.9

## 2021-02-15 LAB — COMPREHENSIVE METABOLIC PANEL
ALT: 19 U/L (ref 0–44)
AST: 28 U/L (ref 15–41)
Albumin: 2.9 g/dL — ABNORMAL LOW (ref 3.5–5.0)
Alkaline Phosphatase: 89 U/L (ref 38–126)
Anion gap: 15 (ref 5–15)
BUN: 32 mg/dL — ABNORMAL HIGH (ref 8–23)
CO2: 21 mmol/L — ABNORMAL LOW (ref 22–32)
Calcium: 9 mg/dL (ref 8.9–10.3)
Chloride: 99 mmol/L (ref 98–111)
Creatinine, Ser: 9.17 mg/dL — ABNORMAL HIGH (ref 0.44–1.00)
GFR, Estimated: 4 mL/min — ABNORMAL LOW (ref >60)
Glucose, Bld: 148 mg/dL — ABNORMAL HIGH (ref 70–99)
Potassium: 3.8 mmol/L (ref 3.5–5.1)
Sodium: 135 mmol/L (ref 135–145)
Total Bilirubin: 0.5 mg/dL (ref 0.3–1.2)
Total Protein: 6.4 g/dL — ABNORMAL LOW (ref 6.5–8.1)

## 2021-02-15 LAB — RESP PANEL BY RT-PCR (FLU A&B, COVID) ARPGX2
Influenza A by PCR: NEGATIVE
Influenza B by PCR: NEGATIVE
SARS Coronavirus 2 by RT PCR: NEGATIVE

## 2021-02-15 LAB — CBC
HCT: 25.1 % — ABNORMAL LOW (ref 36.0–46.0)
Hemoglobin: 6.8 g/dL — CL (ref 12.0–15.0)
MCH: 21.7 pg — ABNORMAL LOW (ref 26.0–34.0)
MCHC: 27.1 g/dL — ABNORMAL LOW (ref 30.0–36.0)
MCV: 79.9 fL — ABNORMAL LOW (ref 80.0–100.0)
Platelets: 251 K/uL (ref 150–400)
RBC: 3.14 MIL/uL — ABNORMAL LOW (ref 3.87–5.11)
RDW: 18.9 % — ABNORMAL HIGH (ref 11.5–15.5)
WBC: 8.6 K/uL (ref 4.0–10.5)
nRBC: 0 % (ref 0.0–0.2)

## 2021-02-15 LAB — HEPATITIS B SURFACE ANTIBODY,QUALITATIVE: Hep B S Ab: NONREACTIVE

## 2021-02-15 LAB — PREPARE RBC (CROSSMATCH)

## 2021-02-15 MED ORDER — LEVOTHYROXINE SODIUM 75 MCG PO TABS
175.0000 ug | ORAL_TABLET | Freq: Every day | ORAL | Status: DC
  Administered 2021-02-16 – 2021-02-20 (×3): 175 ug via ORAL
  Filled 2021-02-15 (×2): qty 1

## 2021-02-15 MED ORDER — PROMETHAZINE HCL 25 MG PO TABS
25.0000 mg | ORAL_TABLET | Freq: Four times a day (QID) | ORAL | Status: DC | PRN
  Administered 2021-02-17 – 2021-02-19 (×5): 25 mg via ORAL
  Filled 2021-02-15 (×5): qty 1

## 2021-02-15 MED ORDER — CLONAZEPAM 0.5 MG PO TABS
1.0000 mg | ORAL_TABLET | Freq: Three times a day (TID) | ORAL | Status: DC | PRN
  Administered 2021-02-16 – 2021-02-19 (×2): 1 mg via ORAL
  Filled 2021-02-15 (×2): qty 2

## 2021-02-15 MED ORDER — SODIUM CHLORIDE 0.9% IV SOLUTION: Freq: Once | INTRAVENOUS | Status: AC

## 2021-02-15 MED ORDER — AZITHROMYCIN 500 MG PO TABS: 250.0000 mg | ORAL_TABLET | Freq: Every day | ORAL | Status: DC

## 2021-02-15 MED ORDER — ESTRADIOL 1 MG PO TABS
1.0000 mg | ORAL_TABLET | Freq: Every day | ORAL | Status: DC
  Administered 2021-02-16 – 2021-02-19 (×4): 1 mg via ORAL
  Filled 2021-02-15 (×5): qty 1

## 2021-02-15 MED ORDER — CHLORHEXIDINE GLUCONATE CLOTH 2 % EX PADS
6.0000 | MEDICATED_PAD | Freq: Every day | CUTANEOUS | Status: DC
  Administered 2021-02-16 – 2021-02-19 (×4): 6 via TOPICAL

## 2021-02-15 MED ORDER — INSULIN ASPART 100 UNIT/ML IJ SOLN
0.0000 [IU] | Freq: Three times a day (TID) | INTRAMUSCULAR | Status: DC
  Administered 2021-02-16: 1 [IU] via SUBCUTANEOUS
  Administered 2021-02-19: 2 [IU] via SUBCUTANEOUS

## 2021-02-15 MED ORDER — HEPARIN SODIUM (PORCINE) 1000 UNIT/ML IJ SOLN
INTRAMUSCULAR | Status: AC
  Filled 2021-02-15: qty 4

## 2021-02-15 MED ORDER — MIDODRINE HCL 5 MG PO TABS
10.0000 mg | ORAL_TABLET | ORAL | Status: DC
  Administered 2021-02-18: 10 mg via ORAL
  Filled 2021-02-15: qty 2

## 2021-02-15 MED ORDER — BENZONATATE 100 MG PO CAPS
200.0000 mg | ORAL_CAPSULE | Freq: Every day | ORAL | Status: DC | PRN
  Filled 2021-02-15: qty 2

## 2021-02-15 MED ORDER — APIXABAN 5 MG PO TABS: 5.0000 mg | ORAL_TABLET | Freq: Two times a day (BID) | ORAL | Status: DC

## 2021-02-15 MED ORDER — FAMOTIDINE 20 MG PO TABS
20.0000 mg | ORAL_TABLET | Freq: Every day | ORAL | Status: DC
  Administered 2021-02-16 – 2021-02-18 (×3): 20 mg via ORAL
  Filled 2021-02-15 (×4): qty 1

## 2021-02-15 MED ORDER — AMIODARONE HCL 200 MG PO TABS
200.0000 mg | ORAL_TABLET | Freq: Every day | ORAL | Status: DC
  Administered 2021-02-16 – 2021-02-18 (×3): 200 mg via ORAL
  Filled 2021-02-15 (×4): qty 1

## 2021-02-15 MED ORDER — VITAMIN B-12 100 MCG PO TABS
500.0000 ug | ORAL_TABLET | Freq: Every day | ORAL | Status: DC
  Administered 2021-02-16 – 2021-02-19 (×4): 500 ug via ORAL
  Filled 2021-02-15 (×4): qty 5

## 2021-02-15 MED ORDER — ACETAMINOPHEN 500 MG PO TABS
250.0000 mg | ORAL_TABLET | Freq: Four times a day (QID) | ORAL | Status: DC | PRN
  Administered 2021-02-16: 250 mg via ORAL
  Filled 2021-02-15: qty 1

## 2021-02-15 MED ORDER — MIDODRINE HCL 5 MG PO TABS
ORAL_TABLET | ORAL | Status: AC
  Administered 2021-02-15: 10 mg via ORAL
  Filled 2021-02-15: qty 2

## 2021-02-15 MED ORDER — INSULIN GLARGINE 100 UNIT/ML SOLOSTAR PEN: 0.0000 [IU] | PEN_INJECTOR | Freq: Every day | SUBCUTANEOUS | Status: DC

## 2021-02-15 NOTE — ED Triage Notes (Addendum)
Pt had labs at dialysis on Thursday and received call this morning that Hgb was 6.  C/o generalized weakness.  Pt pale.  Wearing 2 liters home O2.  Family concerned about bleeding at Ostomy site.  States the Dr at [facility] evaluated ostomy site and told her it was normal.  Pt was supposed to have dialysis today.

## 2021-02-15 NOTE — Hospital Course (Addendum)
Symptomatic Anemia Patient presented with fatigue, low BP, and blood per ostomy bag. Her Hgb was 6.8; after 2u pRBCs Hgb was 9.5. 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 It was coagulated without difficulty, and no other bleeding was noted. Suspect patient's anemia secondary to AVM. GI recommended serial Hgb checks and transfusions as needed. At discharge, patient's condition had improved and Hgb was 9.5.  Chest pain Patient experienced chest pain after using [id number] bowel prep for GI scope. She had experienced this before with previous colonoscopies. Cardiac workup and consult did not reveal any cardiac cause for pain. Suspect pain due to cramps or GI upset from bowel regimen used; miralax for bowel prep did not produce the same results. At discharge, patient did not have chest pain, and her vitals were stable.   ESRD on HD TTS  S/p right nephrectomy for renal cell carcinoma 2017 Patient received HD in hospital with moderate improvements in Cr each time. Nephrology followed her throughout admission. At discharge, patient's Cr was 7.18, and she was headed straight to outpatient HD.   Paroxysmal Atrial Fibrillation Patient remained rate controlled over admission on amiodarone. Her eliquis was held before her GI procedure, but it was added back at discharge.   CAP   Asthma Patient completed 2 week course of azithromycin for suspected CAP during admission. She was given albuterol prn for SOB and wheezing. On discharge, she was breathing comfortably on RA and indicated home nebulizer regimen for chronic symptoms.   Type 2 DM  Patient's CBGs remained in low-mid 100s throughout admission. She did not receive any lantus over admission. She was continued on her home regimen at discharge.   Hx HTN   Hypotension: chronic, stable  Patient's BP remained soft but stable over admission. She received one dose of midodrine before HD inpatient.    GERD   Hypothyroidism   Anxiety   S/p total hysterectomy   HFpEF Patient was continued on synthroid, famotidine, clonazepam prn, and estradiol over admission without incident.
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
## 2021-02-15 NOTE — ED Provider Notes (Addendum)
[facility] EMERGENCY DEPARTMENT Provider Note   CSN: [id number] Arrival date & time: 02/15/21  1254     History  Chief Complaint  Patient presents with   Abnormal labs    [name] is a 81 y.o. female.  HPI Patient presents with anemia.  Past medical history of end-stage renal disease on dialysis.  Also history of atrial flutter on anticoagulation and has colostomy.  Reportedly hemoglobin of 6.  Reportedly has been trending down recently.  Drawn on Thursday and told to come in.  Reportedly her blood pressure will drop with dialysis even without the anemia.  States she has had blood going into her colostomy bag.  States that she has been seen by [facility] who did it and said that it was okay.  States there was just a little blood bleeding on the periphery.  States her back will sometimes be half a little it however.  States she feels lightheaded.  No chest pain.  Does feel fatigued and has some mild trouble breathing.  She was due for dialysis today.   Past Medical History:  Diagnosis Date   Adenomatous colon polyp 02/13/09   Allergy    april- september    Anemia    Anxiety    Asthma    Atrial flutter ([place])    a. By ILR interrogation.   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 01/2015 s/p robot assisted lap nephroureterectomy, lysis of adhesions.   Chronic diastolic CHF (congestive heart failure) ([place])    a. 12/2012 Echo: EF 45%, grade 3 DD; b. 08/2014 TEE: EF 55%.   CKD (chronic kidney disease), stage III (HCC)    Complication of anesthesia    difficult to awaken , N/V   COVID-19 01/30/2020   Degenerative disc disease, cervical    Dementia (HCC)    Depression    Diabetes mellitus without complication (HCC)    Type II   Diverticulitis    Dyspnea    shortness of breath, wears oxygen 2- 2.5 liters   Dysrhythmia    PAF   End stage renal disease ([place])    T/Th/ Sat [place]   Family history of adverse reaction to  anesthesia    Father - N/V   Gastroparesis    GERD (gastroesophageal reflux disease)    Gout    Headache    Hematoma 07/2015   post Nephrectomy   Hiatal hernia    History of blood transfusion    History of kidney stones    passed   HOH (hard of hearing)    Hyperlipidemia    Hypertension    Hypothyroidism    IBS (irritable bowel syndrome)    Myocardial infarction ([place])    2014   Neuropathy of both feet    NICM (nonischemic cardiomyopathy) ([place])    a. 12/2012 Echo: EF 45% with grade 3 DD;  b. 08/2014 TEE: EF 55%, no rwma, mod RAE, mod-sev LAE, triv MR/TR, No LAA thrombus, no PFO/ASD, Grade III plaque in desc thoracic Ao.   Non-obstructive CAD    a. 12/2012 Cath: LM nl, LAD 50p/m, LCX 50-45m (FFR 0.93), RCA min irregs, EF 55-65%-->Med Rx. b. L&RHC 03/21/2014 EF 50-55%, 50% eccentric LCx stenosis with negative FFR, 40% ostial RCA stenosis, 40-50% mid LAD stenosis    Obesity (BMI 30-39.9)    Osteoarthritis    Oxygen deficiency    uses 2-3 liters 02    Oxygen  dependent    a. patient uses 1l at rest and 2L with exertion    PAF (paroxysmal atrial fibrillation) ([place])    a. 2015 - was on tikosyn but developed QT prolongation and torsades in setting of azithromycin-->tikosyn d/c'd, later switched to [facility] 08/2014;  b. 08/2014 s/p AF RFCA;  c. 11/2014 Amio reduced to 100mg  QD;  d. CHA2DS2VASc = 6-->chronic xarelto, reduced to 15mg  QD 02/2014 in setting of CKD/nephrectomy.   PONV (postoperative nausea and vomiting)    PONV (postoperative nausea and vomiting)    Sleep apnea    pt scored 5 per stop bang tool per PAT visit 02/14/2015; results sent to PCP Dr Melina Copa    Status post dilation of esophageal narrowing    Syncope    a. 12/2012: MDT Reveal LINQ ILR placed;  b. 12/2012 Echo: EF 45-50%, Gr 3 DD, mild MR, mildly dil LA;  c. 12/2012 Carotid U/S: 1-39% bilat ICA stenosis.   Vitamin D deficiency    Wears glasses     Home Medications Prior to Admission medications   Medication Sig Start Date  End Date Taking? Authorizing Provider  acetaminophen (TYLENOL) 500 MG tablet Take 0.5 tablets (250 mg total) by mouth every 6 (six) hours as needed for mild pain (pain). 11/22/16  Yes Aline August, MD  amiodarone (PACERONE) 200 MG tablet TAKE 1 TABLET ONE TIME DAILY Patient taking differently: Take 200 mg by mouth daily. 08/01/20  Yes [name], Peter M, MD  apixaban (ELIQUIS) 5 MG TABS tablet TAKE ONE TABLET BY MOUTH TWICE DAILY Patient taking differently: Take 5 mg by mouth 2 (two) times daily. 10/18/20  Yes [name], Peter M, MD  azithromycin (ZITHROMAX) 250 MG tablet Take 250 mg by mouth See admin instructions. Z-pack Start date : 02/12/21   Yes [provider]  benzonatate (TESSALON) 200 MG capsule Take 200 mg by mouth daily as needed for cough.   Yes [provider]  cetirizine (ZYRTEC) 10 MG tablet Take 10 mg by mouth daily.    Yes [provider]  clonazePAM (KLONOPIN) 1 MG tablet Take 1 mg by mouth 3 (three) times daily as needed for anxiety.   Yes [provider]  estradiol (ESTRACE) 1 MG tablet Take 1 mg by mouth daily.   Yes [provider]  famotidine (PEPCID) 20 MG tablet Take 20 mg by mouth daily.   Yes [provider]  fluticasone (FLONASE) 50 MCG/ACT nasal spray Place 1 spray into both nostrils daily. 10/25/19  Yes [provider]  Insulin Glargine (LANTUS) 100 UNIT/ML Solostar Pen Inject 0-35 Units into the skin daily. Sliding scale. 08/05/13  Yes Delfina Redwood, MD  midodrine (PROAMATINE) 10 MG tablet Take 10 mg by mouth See admin instructions. Takes 10 mg on Tuesdays, Thursdays and Saturdays. Takes 10 mg extra if blood pressure drops in between dialysis.   Yes [provider]  multivitamin (RENA-VIT) TABS tablet Take 1 tablet by mouth daily.   Yes [provider]  promethazine (PHENERGAN) 25 MG tablet Take 25 mg by mouth every 6 (six) hours as needed for vomiting or nausea. 09/11/19  Yes [provider]  SYNTHROID 175 MCG tablet Take 175 mcg by mouth daily. 08/14/19  Yes [provider]  vitamin B-12 (CYANOCOBALAMIN) 500 MCG tablet Take 500 mcg by mouth daily.   Yes [provider]  lidocaine-prilocaine (EMLA) cream Apply 1 application topically as needed. Patient not taking: Reported on 02/15/2021 11/01/19   Barbie Banner, PA-C  midodrine (PROAMATINE) 5 MG tablet Take 5 mg on dialysis days prior to dialysis. Patient not taking: Reported on 02/15/2021 07/15/20   [name], Peter M, MD  ondansetron (ZOFRAN) 4 MG tablet Take 1 tablet (4 mg total) by mouth every 8 (eight) hours as needed for nausea or vomiting. Patient not taking: Reported on 02/15/2021 04/28/19   Rosetta Posner, MD  TRUE METRIX BLOOD GLUCOSE TEST test strip  06/29/19   [provider]  TRUEplus Lancets 33G [place]  06/29/19   [provider]      Allergies    Adhesive [tape], Avelox [moxifloxacin], Blueberry flavor, Cefprozil, Cetacaine [butamben-tetracaine-benzocaine], Dicyclomine, Food, Imdur [isosorbide nitrate], Januvia [sitagliptin], Lipitor [atorvastatin], Losartan potassium, Nitroglycerin, Omeprazole, Oxycodone, Penicillins, Prednisone, Vancomycin, Hydrocodone, Latex, Tamiflu [oseltamivir], Feraheme [ferumoxytol], Other, Lasix [furosemide], and Mupirocin    Review of Systems   Review of Systems  Respiratory:  Positive for cough and shortness of breath.    Physical Exam Updated Vital Signs BP (!) 107/55    Pulse 60    Temp 97.7 F (36.5 C)    Resp (!) 21    Ht 5\' 2"  (1.575 m)    Wt 93.4 kg    SpO2 100%    BMI 37.68 kg/m  Physical Exam Vitals and nursing note reviewed.  HENT:     Head: Normocephalic.  Eyes:     Pupils: Pupils are equal, round, and reactive to light.  Cardiovascular:     Rate and Rhythm: Normal rate.  Abdominal:     Tenderness: There is no abdominal tenderness.     Comments: Colostomy bag to lower abdomen. No bleeding  Musculoskeletal:     Cervical back: Neck  supple.  Skin:    Capillary Refill: Capillary refill takes less than 2 seconds.     Coloration: Skin is pale.  Neurological:     Mental Status: She is alert and oriented to person, place, and time.    ED Results / Procedures / Treatments   Labs (all labs ordered are listed, but only abnormal results are displayed) Labs Reviewed  COMPREHENSIVE METABOLIC PANEL - Abnormal; Notable for the following components:      Result Value   CO2 21 (*)    Glucose, Bld 148 (*)    BUN 32 (*)    Creatinine, Ser 9.17 (*)    Total Protein 6.4 (*)    Albumin 2.9 (*)    GFR, Estimated 4 (*)    All other components within normal limits  CBC - Abnormal; Notable for the following components:   RBC 3.14 (*)    Hemoglobin 6.8 (*)    HCT 25.1 (*)    MCV 79.9 (*)    MCH 21.7 (*)    MCHC 27.1 (*)    RDW 18.9 (*)    All other components within normal limits  RESP PANEL BY RT-PCR (FLU A&B, COVID) ARPGX2  HEPATITIS B SURFACE ANTIGEN  HEPATITIS B SURFACE ANTIBODY,QUALITATIVE  HEPATITIS B SURFACE ANTIBODY, QUANTITATIVE  BASIC METABOLIC PANEL  CBC  MAGNESIUM  TYPE AND SCREEN  PREPARE RBC (CROSSMATCH)    EKG EKG Interpretation  Date/Time:  Saturday February 15 2021 14:43:06 EST Ventricular Rate:  61 PR Interval:    QRS Duration: 109 QT Interval:  496 QTC Calculation: 500 R Axis:   4 Text Interpretation: sinus vs afib. Low voltage, precordial leads Borderline prolonged QT interval Confirmed by Davonna Belling [phone]) on 02/15/2021 2:52:49 PM  Radiology DG Chest Portable 1 View  Result Date: 02/15/2021 CLINICAL  DATA:  Shortness of breath. EXAM: PORTABLE CHEST 1 VIEW COMPARISON:  06/25/2020 and prior radiographs FINDINGS: Cardiomegaly and LEFT IJ dialysis catheter again noted. Mild pulmonary vascular congestion identified. There is no evidence of focal airspace disease, pulmonary edema, suspicious pulmonary nodule/mass, pleural effusion, or pneumothorax. No acute bony abnormalities are identified.  IMPRESSION: Cardiomegaly with mild pulmonary vascular congestion. Electronically Signed   By: Margarette Canada M.D.   On: 02/15/2021 14:44    Procedures Procedures    Medications Ordered in ED Medications  Chlorhexidine Gluconate Cloth 2 % PADS 6 each (has no administration in time range)  acetaminophen (TYLENOL) tablet 250 mg (has no administration in time range)  azithromycin (ZITHROMAX) tablet 250 mg (has no administration in time range)  amiodarone (PACERONE) tablet 200 mg (has no administration in time range)  midodrine (PROAMATINE) tablet 10 mg (has no administration in time range)  estradiol (ESTRACE) tablet 1 mg (has no administration in time range)  levothyroxine (SYNTHROID) tablet 175 mcg (has no administration in time range)  famotidine (PEPCID) tablet 20 mg (has no administration in time range)  vitamin B-12 (CYANOCOBALAMIN) tablet 500 mcg (has no administration in time range)  clonazePAM (KLONOPIN) tablet 1 mg (has no administration in time range)  benzonatate (TESSALON) capsule 200 mg (has no administration in time range)  promethazine (PHENERGAN) tablet 25 mg (has no administration in time range)  insulin aspart (novoLOG) injection 0-6 Units (has no administration in time range)  0.9 %  sodium chloride infusion (Manually program via Guardrails IV Fluids) (has no administration in time range)    ED Course/ Medical Decision Making/ A&P                           Medical Decision Making Amount and/or Complexity of Data Reviewed External Data Reviewed: notes. Labs: ordered. Decision-making details documented in ED Course.    Details: anemia. ESRD Radiology: ordered and independent interpretation performed. Decision-making details documented in ED Course. ECG/medicine tests: ordered. Discussion of management or test interpretation with external provider(s): Discussed with Dr. Soyla Murphy from nephrology who will see patient  Risk OTC drugs. Prescription drug management. Decision  regarding hospitalization.   Patient presents with shortness of breath.  Worsening anemia.  Hemoglobin is 6.8 here.  Likely due to bleeding around her colostomy.  Also history of dialysis.  Due for dialysis today.  Not hypoxic.  Mild hypotension and tachycardia.  Appears as if she may be in her atrial flutter although difficult to determine flutter versus small P waves.  Could also be tachycardic due to the anemia.  She already is anticoagulated.  Will discuss with nephrology for dialysis plans.  We will also require transfusion, but with volume overload already showing on x-ray we will not do to expeditiously.  Will discuss with unassigned medicine for admission also  CRITICAL CARE Performed by: Davonna Belling Total critical care time: 30 minutes Critical care time was exclusive of separately billable procedures and treating other patients. Critical care was necessary to treat or prevent imminent or life-threatening deterioration. Critical care was time spent personally by me on the following activities: development of treatment plan with patient and/or surrogate as well as nursing, discussions with consultants, evaluation of patient's response to treatment, examination of patient, obtaining history from patient or surrogate, ordering and performing treatments and interventions, ordering and review of laboratory studies, ordering and review of radiographic studies, pulse oximetry and re-evaluation of patient's condition.  Final Clinical Impression(s) / ED Diagnoses Final diagnoses:  Blood loss anemia  End stage renal disease [facility])    Rx / DC Orders ED Discharge Orders     None         Davonna Belling, MD 02/15/21 Holli Humbles    Davonna Belling, MD 02/24/21 1452
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
## 2021-02-16 DIAGNOSIS — N186 End stage renal disease: Secondary | ICD-10-CM

## 2021-02-16 DIAGNOSIS — N189 Chronic kidney disease, unspecified: Secondary | ICD-10-CM

## 2021-02-16 DIAGNOSIS — K5521 Angiodysplasia of colon with hemorrhage: Secondary | ICD-10-CM | POA: Diagnosis present

## 2021-02-16 DIAGNOSIS — J45909 Unspecified asthma, uncomplicated: Secondary | ICD-10-CM | POA: Diagnosis present

## 2021-02-16 DIAGNOSIS — Z85528 Personal history of other malignant neoplasm of kidney: Secondary | ICD-10-CM | POA: Diagnosis not present

## 2021-02-16 DIAGNOSIS — I4819 Other persistent atrial fibrillation: Secondary | ICD-10-CM | POA: Diagnosis present

## 2021-02-16 DIAGNOSIS — I2510 Atherosclerotic heart disease of native coronary artery without angina pectoris: Secondary | ICD-10-CM | POA: Diagnosis not present

## 2021-02-16 DIAGNOSIS — Z8616 Personal history of COVID-19: Secondary | ICD-10-CM | POA: Diagnosis not present

## 2021-02-16 DIAGNOSIS — I428 Other cardiomyopathies: Secondary | ICD-10-CM | POA: Diagnosis present

## 2021-02-16 DIAGNOSIS — E1122 Type 2 diabetes mellitus with diabetic chronic kidney disease: Secondary | ICD-10-CM | POA: Diagnosis present

## 2021-02-16 DIAGNOSIS — C182 Malignant neoplasm of ascending colon: Secondary | ICD-10-CM | POA: Diagnosis not present

## 2021-02-16 DIAGNOSIS — C186 Malignant neoplasm of descending colon: Secondary | ICD-10-CM | POA: Diagnosis not present

## 2021-02-16 DIAGNOSIS — D62 Acute posthemorrhagic anemia: Secondary | ICD-10-CM | POA: Diagnosis present

## 2021-02-16 DIAGNOSIS — K9411 Enterostomy hemorrhage: Secondary | ICD-10-CM | POA: Diagnosis present

## 2021-02-16 DIAGNOSIS — D649 Anemia, unspecified: Secondary | ICD-10-CM | POA: Diagnosis present

## 2021-02-16 DIAGNOSIS — Z992 Dependence on renal dialysis: Secondary | ICD-10-CM

## 2021-02-16 DIAGNOSIS — I3139 Other pericardial effusion (noninflammatory): Secondary | ICD-10-CM | POA: Diagnosis present

## 2021-02-16 DIAGNOSIS — I953 Hypotension of hemodialysis: Secondary | ICD-10-CM | POA: Diagnosis present

## 2021-02-16 DIAGNOSIS — I5042 Chronic combined systolic (congestive) and diastolic (congestive) heart failure: Secondary | ICD-10-CM | POA: Diagnosis present

## 2021-02-16 DIAGNOSIS — L8990 Pressure ulcer of unspecified site, unspecified stage: Secondary | ICD-10-CM | POA: Insufficient documentation

## 2021-02-16 DIAGNOSIS — D631 Anemia in chronic kidney disease: Secondary | ICD-10-CM

## 2021-02-16 DIAGNOSIS — Z9049 Acquired absence of other specified parts of digestive tract: Secondary | ICD-10-CM | POA: Diagnosis not present

## 2021-02-16 DIAGNOSIS — R072 Precordial pain: Secondary | ICD-10-CM | POA: Diagnosis not present

## 2021-02-16 DIAGNOSIS — Z85038 Personal history of other malignant neoplasm of large intestine: Secondary | ICD-10-CM | POA: Diagnosis not present

## 2021-02-16 DIAGNOSIS — R079 Chest pain, unspecified: Secondary | ICD-10-CM | POA: Diagnosis not present

## 2021-02-16 DIAGNOSIS — I132 Hypertensive heart and chronic kidney disease with heart failure and with stage 5 chronic kidney disease, or end stage renal disease: Secondary | ICD-10-CM | POA: Diagnosis present

## 2021-02-16 DIAGNOSIS — J9610 Chronic respiratory failure, unspecified whether with hypoxia or hypercapnia: Secondary | ICD-10-CM | POA: Diagnosis present

## 2021-02-16 DIAGNOSIS — D500 Iron deficiency anemia secondary to blood loss (chronic): Secondary | ICD-10-CM | POA: Diagnosis not present

## 2021-02-16 DIAGNOSIS — E669 Obesity, unspecified: Secondary | ICD-10-CM | POA: Diagnosis present

## 2021-02-16 DIAGNOSIS — I5032 Chronic diastolic (congestive) heart failure: Secondary | ICD-10-CM | POA: Diagnosis not present

## 2021-02-16 DIAGNOSIS — I4892 Unspecified atrial flutter: Secondary | ICD-10-CM | POA: Diagnosis present

## 2021-02-16 DIAGNOSIS — E039 Hypothyroidism, unspecified: Secondary | ICD-10-CM | POA: Diagnosis present

## 2021-02-16 DIAGNOSIS — K5520 Angiodysplasia of colon without hemorrhage: Secondary | ICD-10-CM | POA: Diagnosis not present

## 2021-02-16 DIAGNOSIS — Z20822 Contact with and (suspected) exposure to covid-19: Secondary | ICD-10-CM | POA: Diagnosis present

## 2021-02-16 DIAGNOSIS — Z905 Acquired absence of kidney: Secondary | ICD-10-CM | POA: Diagnosis not present

## 2021-02-16 HISTORY — DX: Chronic kidney disease, unspecified: N18.9

## 2021-02-16 HISTORY — DX: Anemia in chronic kidney disease: D63.1

## 2021-02-16 HISTORY — DX: Pressure ulcer of unspecified site, unspecified stage: L89.90

## 2021-02-16 LAB — TYPE AND SCREEN
ABO/RH(D): A POS
Antibody Screen: NEGATIVE
Unit division: 0
Unit division: 0

## 2021-02-16 LAB — BPAM RBC
Blood Product Expiration Date: 202302122359
Blood Product Expiration Date: 202302122359
ISSUE DATE / TIME: 202301212106
ISSUE DATE / TIME: 202301212106
Unit Type and Rh: 6200
Unit Type and Rh: 6200

## 2021-02-16 LAB — CBC
HCT: 31.3 % — ABNORMAL LOW (ref 36.0–46.0)
Hemoglobin: 9.5 g/dL — ABNORMAL LOW (ref 12.0–15.0)
MCH: 23.9 pg — ABNORMAL LOW (ref 26.0–34.0)
MCHC: 30.4 g/dL (ref 30.0–36.0)
MCV: 78.8 fL — ABNORMAL LOW (ref 80.0–100.0)
Platelets: 172 10*3/uL (ref 150–400)
RBC: 3.97 MIL/uL (ref 3.87–5.11)
RDW: 18.2 % — ABNORMAL HIGH (ref 11.5–15.5)
WBC: 7.7 10*3/uL (ref 4.0–10.5)
nRBC: 0 % (ref 0.0–0.2)

## 2021-02-16 LAB — TSH: TSH: 0.756 u[IU]/mL (ref 0.350–4.500)

## 2021-02-16 LAB — BASIC METABOLIC PANEL
Anion gap: 13 (ref 5–15)
BUN: 12 mg/dL (ref 8–23)
CO2: 23 mmol/L (ref 22–32)
Calcium: 8.3 mg/dL — ABNORMAL LOW (ref 8.9–10.3)
Chloride: 98 mmol/L (ref 98–111)
Creatinine, Ser: 4.52 mg/dL — ABNORMAL HIGH (ref 0.44–1.00)
GFR, Estimated: 9 mL/min — ABNORMAL LOW (ref >60)
Glucose, Bld: 164 mg/dL — ABNORMAL HIGH (ref 70–99)
Potassium: 3.9 mmol/L (ref 3.5–5.1)
Sodium: 134 mmol/L — ABNORMAL LOW (ref 135–145)

## 2021-02-16 LAB — GLUCOSE, CAPILLARY
Glucose-Capillary: 110 mg/dL — ABNORMAL HIGH (ref 70–99)
Glucose-Capillary: 159 mg/dL — ABNORMAL HIGH (ref 70–99)
Glucose-Capillary: 148 mg/dL — ABNORMAL HIGH (ref 70–99)
Glucose-Capillary: 184 mg/dL — ABNORMAL HIGH (ref 70–99)

## 2021-02-16 LAB — MAGNESIUM: Magnesium: 1.8 mg/dL (ref 1.7–2.4)

## 2021-02-16 LAB — PHOSPHORUS: Phosphorus: 4.3 mg/dL (ref 2.5–4.6)

## 2021-02-16 LAB — HEPATITIS B SURFACE ANTIGEN: Hepatitis B Surface Ag: NONREACTIVE

## 2021-02-16 MED ORDER — ALBUTEROL SULFATE (2.5 MG/3ML) 0.083% IN NEBU
2.5000 mg | INHALATION_SOLUTION | Freq: Four times a day (QID) | RESPIRATORY_TRACT | Status: DC | PRN
  Administered 2021-02-16: 2.5 mg via RESPIRATORY_TRACT
  Filled 2021-02-16: qty 3

## 2021-02-16 MED ORDER — CALCITRIOL 0.5 MCG PO CAPS
1.0000 ug | ORAL_CAPSULE | ORAL | Status: DC
  Administered 2021-02-18: 1 ug via ORAL
  Filled 2021-02-16: qty 2

## 2021-02-16 MED ORDER — FLEET ENEMA 7-19 GM/118ML RE ENEM
2.0000 | ENEMA | Freq: Once | RECTAL | Status: DC
  Filled 2021-02-16: qty 2

## 2021-02-16 MED ORDER — ACETAMINOPHEN 325 MG PO TABS
650.0000 mg | ORAL_TABLET | Freq: Four times a day (QID) | ORAL | Status: DC | PRN
  Administered 2021-02-19: 650 mg via ORAL
  Filled 2021-02-16: qty 2

## 2021-02-16 NOTE — Progress Notes (Signed)
FPTS Brief Progress Note  S: Patient is awake, daughter is at bedside. Daughter notes concern regarding patients shortness of breath and cough, wondering about CXR results. Feels that her color is "coming back". Patient notes she uses 2-3L O2 at baseline. She continues to have a non-productive cough that is bothersome to her. States that she was given two rounds of antibiotics- they are unable to tell me how many days exactly that patient has been on antibiotics but believe it to be around 2 weeks. They feel her asthma is flaring, she doesn't have any medications she takes for her asthma but note PCP recently sent a prescription for a Nebulizer which they have not yet obtained.     O: BP 115/65 (BP Location: Right Arm)    Pulse 66    Temp 98.2 F (36.8 C)    Resp 20    Ht 5\' 2"  (1.575 m)    Wt 98.8 kg    SpO2 100%    BMI 39.84 kg/m   Gen: Elderly woman, pale, NAD, awake, conversational HEENT: Normocephalic, conjunctival pallor Resp: Normal effort, on 2L [place], rhonchi at bases, end-expiratory wheeze right lower base Abd: colostomy bag RLQ with good drainage, no occult bleeding appreciated, abdomen is obese, soft, non-tender  Ext: 2+ radial, DP and PT pulses b/l, no edema   A/P: Symptomatic Anemia S/p 2U PRBC. Post-transfusion hemoglobin 9.5. May have been a slow loss over time from bleeding around ostomy. Daughter did show me a picture of ostomy bag that had frank blood in it- cannot recall when picture was taken.  -Monitor clinically -Daily CBC; transfusion threshold 8 -Remainder per H&P; progress note to follow shortly   Cough   Hx Asthma Will add on albuterol. Discussed d/c Azithromycin with patient and daughter given no fevers, CXR without concern for pneumonia.  -D/c Azithromycin -Albuterol PRN SOB, wheezing -Tessalon perles PRN cough   ESRD on HD TTS Received HD 1/21.  -Nephrology following   - Orders reviewed. Labs for AM ordered, which was adjusted as needed.   Sharion Settler, DO 02/16/2021, 2:14 AM PGY-2, [facility] Night Resident  Please page [phone] with questions.

## 2021-02-16 NOTE — Progress Notes (Signed)
[name]  Subjective: seen in room, Hb up 9's, feeling much better. HD overnight. No sig UF.   Vitals:   02/16/21 0407 02/16/21 0420 02/16/21 0429 02/16/21 0912  BP:  (!) 91/40 92/60 (!) 101/54  Pulse: 68 63 64 (!) 59  Resp: 18 18  16   Temp:  98 F (36.7 C)  99.1 F (37.3 C)  TempSrc:    Oral  SpO2: 99% 100%  100%  Weight:      Height:        Exam:  alert, nad   no jvd  Chest cta bilat  Cor reg no RG  Abd soft ntnd no ascites   Ext no LE edema   Alert, NF, ox3  L IJ TDC        Home meds include - amiodarone, eliquis, klonopin prn, pepcid, lantus insulin, midodrine 10mg  pre hd tts, synthroid, prns    CXR - 1/21 >> Mild pulmonary vascular congestion identified. There is no evidence of focal airspace disease, pulmonary edema, suspicious pulmonary nodule/mass, pleural effusion, or pneumothorax.     OP HD: TTS [facility]   3.5h  2/2 bath  93.6kg  400/1.5  Heparin none  L IJ TDC (failed LUE access, failed L thigh AVG)  - mircera 225 q2, last 1/19  - calc 1 ug tiw      Assessment/ Plan: Anemia, symptomatic - sp 2u prbc's overnight, Hb 9's today. Feels much better.  ESRD - usual HD TTS.  Had HD here last night w/ no sig UF. Next HD 1/24.  BP/ volume - low bp's chronically, on midodrine pre HD tts. No vol ^ on exam. CXR clear.  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   MBD ckd - cont vdra. Ca in range, adding on phos level. ? binder Parox atrial fib - per pmd. On amio , eliquis.  DM - on insulin SP colostomy - w/ colon cancer surgery in 2022    [name] 02/16/2021, 9:24 AM   Recent Labs  Lab 02/15/21 1307 02/16/21 0129  K 3.8 3.9  BUN 32* 12  CREATININE 9.17* 4.52*  CALCIUM 9.0 8.3*  HGB 6.8* 9.5*   Inpatient medications:  sodium chloride   Intravenous Once   amiodarone  200 mg Oral Daily   Chlorhexidine Gluconate Cloth  6 each Topical Q0600   estradiol  1 mg Oral Daily   famotidine  20 mg Oral Daily   heparin sodium (porcine)        insulin aspart  0-6 Units Subcutaneous TID WC   levothyroxine  175 mcg Oral Daily   [START ON 02/18/2021] midodrine  10 mg Oral Q T,Th,Sa-HD   vitamin B-12  500 mcg Oral Daily    acetaminophen, albuterol, benzonatate, clonazePAM, promethazine

## 2021-02-16 NOTE — Plan of Care (Signed)
  Problem: Clinical Measurements: Goal: Diagnostic test results will improve Outcome: Progressing   

## 2021-02-16 NOTE — Progress Notes (Signed)
RN called for PRN neb due to wheezing. PT awake and alert on [place]. Pt has small wheeze. Tx given and tolerated well. No adverse reactions. No resp distress noted.

## 2021-02-16 NOTE — Progress Notes (Addendum)
FPTS Brief Progress Note  S: Patient is laying in bed asleep with daughter at bedside.  Did not wake patient.  She is currently on room air with O2 saturations in the mid 90s.  O: BP 121/65 (BP Location: Right Arm)    Pulse 66    Temp 98.9 F (37.2 C) (Oral)    Resp 16    Ht 5\' 2"  (1.575 m)    Wt 98.8 kg    SpO2 96%    BMI 39.84 kg/m   General: Asleep , NAD Pulm: Normal work of breathing on room air   A/P: -Continue plan per day team - Orders reviewed. Labs for AM ordered, which was adjusted as needed.    [name], MD 02/16/2021, 11:54 PM PGY-1, [place] [facility] Night Resident  Please page [phone] with questions.

## 2021-02-16 NOTE — Progress Notes (Addendum)
Family Medicine Teaching Service Daily Progress Note Intern Pager: [phone]  Patient name: [name] Medical record number: [id number] Date of birth: 1941-01-20 Age: 81 y.o. Gender: female  Primary Care Provider: Wannetta Sender, FNP Consultants: Nephrology Code Status: FULL  Pt Overview and Major Events to Date:  1/21: Admitted, transfused 2U PRBC  Assessment and Plan: [name] is a 81 y.o. female who presented with generalized weakness and dyspnea, found to have symptomatic anemia. She is now s/p 2U PRBC. Additional PMHx is significant for ESRD on HD TTS, HFpEF, paroxysmal atrial flutter, CAD, HLD, hypothyroidism, lynch syndrome s/p right hemicolectomy.   Symptomatic Anemia  Hemoglobin 6.8 on admission, received 2U PRBC with dialysis yesterday. Post-transfusion hemoglobin 9.5. Per chart review, appears that she has not tolerated iron infusions in the past. Ostomy bag without any blood. Could be multifactorial with iron-deficiency and anemia of chronic disease. She has previous history of macrocytic anemia and is on B12 supplementation. -Monitor CBC daily  -Monitor clinically for bleeding  -PT/OT eval and treat -Continuous telemetry, pulse ox -Consulted GI, Dr. Benson Norway; appreciate recommendations  ESRD on HD TTS  S/p right nephrectomy for renal cell carcinoma 2017 Received dialysis evening of 1/21. -Nephrology following, appreciate care and recommendations -Midodrine 10 mg with HD   Paroxysmal Atrial Fibrillation Per chart review, has history of ablation with recurrence of atrial fibrillation. CHA?DS?-VASc score of 9, moderate-high risk of CVA. HAS-BLED score of 4, with 8.7% yearly major bleeding risk. -Holding Eliquis given anemia  -Continue amiodarone 200 mg daily  -Keep K >4, Mg >2 -Continuous telemetry   ?CAP   Asthma  Appears that patient recently dx with CAP. Was started on Azithromycin prior to admission. Has been afebrile and has already completed  nearly 2 weeks of abx. CXR without any focal airspace disease, pulmonary edema or suspicious mass. On home O2. Not on any home medication for asthma, slight wheezing on exam. -Discontinue Azithromycin  -Albuterol PRN SOB, wheeze -Continuous pulse ox   Type 2 DM  Last Hgb A1c 05/2020 was 5.5. Home medication includes Lantus. -very sensitive SSI   Hx HTN   Hypotension: chronic, stable  Hypotensive on admission, BP have improved some but are still soft.  -Monitor BP per floor routine  -Midodrine PRN with dialysis   Hypothyroidism  Stable on synthroid. Repeat TSH this morning was within normal limits. 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 Drue Flirt. Noted some bleeding around ostomy site at that time. Per note, appears that patient had suspected disease in her descending colon that was not removed in surgery due to her high risk and instability in the OR. She was scheduled to undergo EGD to further evaluate nausea. No frank blood in ostomy. Per chart review, appears that based on staging, patient is not a candidate for adjuvant therapy.  GERD: chronic, stable -Famotidine 20 mg daily  HFpEF, EF 50-55%, Grade III DD Echo performed 05/2020. -Monitor clinically   Anxiety: chronic, stable PDMP reviewed, has received fairly regular monthly prescriptions of 1 mg Clonazepam. Most recent fill 12/2020 for 90 tablets, 30 days. -Continue clonazepam 1 mg TID PRN   S/p total Hysterectomy States that she has been taking estradiol for some time now for her headaches due to hormonal imbalances since her hysterectomy. Notes she tried to come off of it at one point but had continuous headache.  -Continue estradiol  FEN/GI: Renal/carb modified  PPx: Holding given anemia requiring PRBC transfusions Dispo:Pending PT recommendations   and clinical improvement . Barriers include symptomatic  anemia.   Subjective:  Patient feels slightly improved this morning.  She feels that her breathing slightly improved after neb treatment.  She is hopeful to go home soon, and is wondering if she be able to go home today.   Objective: Temp:  [97.7 F (36.5 C)-98.7 F (37.1 C)] 98.2 F (36.8 C) (01/21 2220) Pulse Rate:  [56-120] 58 (01/21 2241) Resp:  [13-23] 16 (01/21 2241) BP: (81-140)/(46-126) 103/58 (01/21 2241) SpO2:  [100 %] 100 % (01/21 2241) Weight:  [93.4 kg] 93.4 kg (01/21 1339) Physical Exam: General: Elderly woman, pale Cardiovascular: RRR without murmur Respiratory: Scant wheezes b/l bases, normal effort, on 2 L [place] Abdomen: Obese abdomen, soft, non-tender, right ostomy bag in place with brown output, no blood seen  Extremities: No edema  Laboratory: Recent Labs  Lab 02/15/21 1307  WBC 8.6  HGB 6.8*  HCT 25.1*  PLT 251   Recent Labs  Lab 02/15/21 1307  NA 135  K 3.8  CL 99  CO2 21*  BUN 32*  CREATININE 9.17*  CALCIUM 9.0  PROT 6.4*  BILITOT 0.5  ALKPHOS 89  ALT 19  AST 28  GLUCOSE 148*    Imaging/Diagnostic Tests: DG Chest Portable 1 View  Result Date: 02/15/2021 CLINICAL DATA:  Shortness of breath. EXAM: PORTABLE CHEST 1 VIEW COMPARISON:  06/25/2020 and prior radiographs FINDINGS: Cardiomegaly and LEFT IJ dialysis catheter again noted. Mild pulmonary vascular congestion identified. There is no evidence of focal airspace disease, pulmonary edema, suspicious pulmonary nodule/mass, pleural effusion, or pneumothorax. No acute bony abnormalities are identified. IMPRESSION: Cardiomegaly with mild pulmonary vascular congestion. Electronically Signed   By: Margarette Canada M.D.   On: 02/15/2021 14:44     Sharion Settler, DO 02/16/2021, 12:09 AM PGY-2, [facility] Intern pager: [phone], text pages welcome

## 2021-02-16 NOTE — Evaluation (Signed)
Physical Therapy Evaluation Patient Details Name: [name] MRN: [id number] DOB: 26-Jun-1940 Today's Date: 02/16/2021  History of Present Illness  Pt is a 81 y.o. female who presented with generalized weakness and dyspnea, found to have symptomatic anemia. She is now s/p 2U PRBC. Additional PMHx is significant for ESRD on HD TTS, HFpEF, paroxysmal atrial flutter, CAD, HLD, hypothyroidism, lynch syndrome s/p right hemicolectomy.  Clinical Impression  Patient presents essentially at baseline from a functional stanpoint.  O2 sats remained stable during ambulation on room air.  No further PT needs identified.  PT will sign off.  Patient does need to mobilize daily while in hospital.        Recommendations for follow up therapy are one component of a multi-disciplinary discharge planning process, led by the attending physician.  Recommendations may be updated based on patient status, additional functional criteria and insurance authorization.  Follow Up Recommendations No PT follow up    Assistance Recommended at Discharge PRN  Patient can return home with the following  Assistance with cooking/housework    Equipment Recommendations Rollator (4 wheels)  Recommendations for Other Services       Functional Status Assessment Patient has not had a recent decline in their functional status     Precautions / Restrictions Precautions Precautions: Fall      Mobility  Bed Mobility               General bed mobility comments: sitting EOB upon arrival    Transfers Overall transfer level: Modified independent Equipment used: Rollator (4 wheels)                    Ambulation/Gait Ambulation/Gait assistance: Modified independent (Device/Increase time) Gait Distance (Feet): 70 Feet Assistive device: Rollator (4 wheels) Gait Pattern/deviations: Step-through pattern, WFL(Within Functional Limits) Gait velocity: decreased     General Gait Details: able to tolerate mild  perturbation during gait  Stairs            Wheelchair Mobility    Modified Rankin (Stroke Patients Only)       Balance Overall balance assessment: Needs assistance Sitting-balance support: No upper extremity supported, Feet supported Sitting balance-Leahy Scale: Normal     Standing balance support: Bilateral upper extremity supported, During functional activity, Reliant on assistive device for balance Standing balance-Leahy Scale: Poor                               Pertinent Vitals/Pain Pain Assessment Pain Assessment: No/denies pain    Home Living Family/patient expects to be discharged to:: Private residence Living Arrangements: Children Available Help at Discharge: Family;Available 24 hours/day Type of Home: House Home Access: Ramped entrance       Home Layout: One level Home Equipment: Rollator (4 wheels) Additional Comments: Son lives with her.  He is high-functioning autisitic.    Prior Function Prior Level of Function : Needs assist             Mobility Comments: ambulates in home independently with distant supervision using rollator       Hand Dominance        Extremity/Trunk Assessment        Lower Extremity Assessment Lower Extremity Assessment: Overall WFL for tasks assessed       Communication   Communication: HOH  Cognition Arousal/Alertness: Awake/alert Behavior During Therapy: WFL for tasks assessed/performed Overall Cognitive Status: Within Functional Limits for tasks assessed  General Comments      Exercises     Assessment/Plan    PT Assessment    PT Problem List         PT Treatment Interventions      PT Goals (Current goals can be found in the Care Plan section)  Acute Rehab PT Goals PT Goal Formulation: All assessment and education complete, DC therapy    Frequency       Co-evaluation               AM-PAC PT "6 Clicks"  Mobility  Outcome Measure Help needed turning from your back to your side while in a flat bed without using bedrails?: None Help needed moving from lying on your back to sitting on the side of a flat bed without using bedrails?: A Little Help needed moving to and from a bed to a chair (including a wheelchair)?: None Help needed standing up from a chair using your arms (e.g., wheelchair or bedside chair)?: None Help needed to walk in hospital room?: None Help needed climbing 3-5 steps with a railing? : A Little 6 Click Score: 22    End of Session Equipment Utilized During Treatment: Gait belt Activity Tolerance: Patient tolerated treatment well Patient left: in bed;with call bell/phone within reach;with family/visitor present (sitting EOB)   PT Visit Diagnosis: Unsteadiness on feet (R26.81)    Time: 9924-2683 PT Time Calculation (min) (ACUTE ONLY): 19 min   Charges:   PT Evaluation $PT Eval Moderate Complexity: 1 Mod         02/16/2021 Margie, PT Acute Rehabilitation Services Pager:  [phone] Office:  [phone]    Shanna Cisco 02/16/2021, 12:02 PM
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
## 2021-02-16 NOTE — Evaluation (Signed)
Occupational Therapy Evaluation Patient Details Name: [name] MRN: [id number] DOB: [birth year] Today's Date: 02/16/2021   History of Present Illness Pt is a 81 y.o. female who presented with generalized weakness and dyspnea, found to have symptomatic anemia. She is now s/p 2U PRBC. Additional PMHx is significant for ESRD on HD TTS, HFpEF, paroxysmal atrial flutter, CAD, HLD, hypothyroidism, lynch syndrome s/p right hemicolectomy.   Clinical Impression   Pt admitted for concerns listed above. PTA pt reported that she was independent with all ADL's and functional mobility, using a rollator. At this time, pt continues to present with independence, demonstrating no difficulties with ADL's or functional mobility. Additionally O2 sats maintained well throughout, even when pt was SOB after mobility. Pt has no further OT needs and acute OT will sign off.       Recommendations for follow up therapy are one component of a multi-disciplinary discharge planning process, led by the attending physician.  Recommendations may be updated based on patient status, additional functional criteria and insurance authorization.   Follow Up Recommendations  No OT follow up    Assistance Recommended at Discharge PRN  Patient can return home with the following      Functional Status Assessment  Patient has had a recent decline in their functional status and demonstrates the ability to make significant improvements in function in a reasonable and predictable amount of time.  Equipment Recommendations  None recommended by OT    Recommendations for Other Services       Precautions / Restrictions Precautions Precautions: Fall Restrictions Weight Bearing Restrictions: No      Mobility Bed Mobility               General bed mobility comments: sitting EOB upon arrival    Transfers Overall transfer level: Modified independent Equipment used: Rollator (4 wheels)                       Balance Overall balance assessment: Needs assistance Sitting-balance support: No upper extremity supported, Feet supported Sitting balance-Leahy Scale: Normal     Standing balance support: Bilateral upper extremity supported, During functional activity, Reliant on assistive device for balance Standing balance-Leahy Scale: Poor                             ADL either performed or assessed with clinical judgement   ADL Overall ADL's : Modified independent;At baseline                                       General ADL Comments: At baseline, able tocomplete all ADL's with no increased difficulties     Vision Baseline Vision/History: 1 Wears glasses Ability to See in Adequate Light: 0 Adequate Patient Visual Report: No change from baseline Vision Assessment?: No apparent visual deficits     Perception     Praxis      Pertinent Vitals/Pain Pain Assessment Pain Assessment: No/denies pain     Hand Dominance Right   Extremity/Trunk Assessment Upper Extremity Assessment Upper Extremity Assessment: Overall WFL for tasks assessed   Lower Extremity Assessment Lower Extremity Assessment: Defer to PT evaluation   Cervical / Trunk Assessment Cervical / Trunk Assessment: Kyphotic   Communication Communication Communication: HOH   Cognition Arousal/Alertness: Awake/alert Behavior During Therapy: WFL for tasks assessed/performed Overall Cognitive Status: Within Functional Limits for  tasks assessed                                       General Comments  VSS on RA, pt ambulated in hallway and returned on RA and o2 remained above 94%    Exercises     Shoulder Instructions      Home Living Family/patient expects to be discharged to:: Private residence Living Arrangements: Children Available Help at Discharge: Family;Available 24 hours/day Type of Home: House Home Access: Ramped entrance     Home Layout: One level      Bathroom Shower/Tub: Occupational psychologist: Handicapped height Bathroom Accessibility: No   Home Equipment: Rollator (4 wheels)   Additional Comments: Son lives with her.  He is high-functioning autisitic.      Prior Functioning/Environment Prior Level of Function : Needs assist             Mobility Comments: ambulates in home independently with distant supervision using rollator ADLs Comments: Indep per pt        OT Problem List: Decreased strength;Decreased activity tolerance;Impaired balance (sitting and/or standing)      OT Treatment/Interventions:      OT Goals(Current goals can be found in the care plan section) Acute Rehab OT Goals Patient Stated Goal: To go home OT Goal Formulation: All assessment and education complete, DC therapy Time For Goal Achievement: 03/02/21 Potential to Achieve Goals: Good  OT Frequency:      Co-evaluation              AM-PAC OT "6 Clicks" Daily Activity     Outcome Measure Help from another person eating meals?: None Help from another person taking care of personal grooming?: None Help from another person toileting, which includes using toliet, bedpan, or urinal?: None Help from another person bathing (including washing, rinsing, drying)?: None Help from another person to put on and taking off regular upper body clothing?: None Help from another person to put on and taking off regular lower body clothing?: None 6 Click Score: 24   End of Session Equipment Utilized During Treatment: Rollator (4 wheels) Nurse Communication: Mobility status  Activity Tolerance: Patient tolerated treatment well Patient left: in bed;with call bell/phone within reach;with family/visitor present  OT Visit Diagnosis: Unsteadiness on feet (R26.81);Other abnormalities of gait and mobility (R26.89);Muscle weakness (generalized) (M62.81)                Time: 3790-2409 OT Time Calculation (min): 23 min Charges:  OT General Charges $OT  Visit: 1 Visit OT Evaluation $OT Eval Moderate Complexity: 1 Mod OT Treatments $Therapeutic Activity: 8-22 mins  Akshith Moncus H., OTR/L Acute Rehabilitation  Aviyah Swetz Elane Kendel Pesnell 02/16/2021, 6:18 PM

## 2021-02-17 ENCOUNTER — Inpatient Hospital Stay (HOSPITAL_COMMUNITY): Payer: Medicare HMO

## 2021-02-17 DIAGNOSIS — D500 Iron deficiency anemia secondary to blood loss (chronic): Secondary | ICD-10-CM | POA: Diagnosis not present

## 2021-02-17 DIAGNOSIS — C182 Malignant neoplasm of ascending colon: Secondary | ICD-10-CM | POA: Diagnosis not present

## 2021-02-17 DIAGNOSIS — N186 End stage renal disease: Secondary | ICD-10-CM | POA: Diagnosis not present

## 2021-02-17 DIAGNOSIS — C186 Malignant neoplasm of descending colon: Secondary | ICD-10-CM

## 2021-02-17 LAB — RENAL FUNCTION PANEL
Albumin: 2.5 g/dL — ABNORMAL LOW (ref 3.5–5.0)
Anion gap: 11 (ref 5–15)
BUN: 27 mg/dL — ABNORMAL HIGH (ref 8–23)
CO2: 21 mmol/L — ABNORMAL LOW (ref 22–32)
Calcium: 8.6 mg/dL — ABNORMAL LOW (ref 8.9–10.3)
Chloride: 103 mmol/L (ref 98–111)
Creatinine, Ser: 6.64 mg/dL — ABNORMAL HIGH (ref 0.44–1.00)
GFR, Estimated: 6 mL/min — ABNORMAL LOW (ref >60)
Glucose, Bld: 133 mg/dL — ABNORMAL HIGH (ref 70–99)
Phosphorus: 7.9 mg/dL — ABNORMAL HIGH (ref 2.5–4.6)
Potassium: 4.1 mmol/L (ref 3.5–5.1)
Sodium: 135 mmol/L (ref 135–145)

## 2021-02-17 LAB — CBC
HCT: 29.1 % — ABNORMAL LOW (ref 36.0–46.0)
Hemoglobin: 8.3 g/dL — ABNORMAL LOW (ref 12.0–15.0)
MCH: 23 pg — ABNORMAL LOW (ref 26.0–34.0)
MCHC: 28.5 g/dL — ABNORMAL LOW (ref 30.0–36.0)
MCV: 80.6 fL (ref 80.0–100.0)
Platelets: 204 10*3/uL (ref 150–400)
RBC: 3.61 MIL/uL — ABNORMAL LOW (ref 3.87–5.11)
RDW: 19 % — ABNORMAL HIGH (ref 11.5–15.5)
WBC: 9.6 10*3/uL (ref 4.0–10.5)
nRBC: 0 % (ref 0.0–0.2)

## 2021-02-17 LAB — GLUCOSE, CAPILLARY
Glucose-Capillary: 123 mg/dL — ABNORMAL HIGH (ref 70–99)
Glucose-Capillary: 115 mg/dL — ABNORMAL HIGH (ref 70–99)
Glucose-Capillary: 147 mg/dL — ABNORMAL HIGH (ref 70–99)

## 2021-02-17 LAB — TROPONIN I (HIGH SENSITIVITY): Troponin I (High Sensitivity): 21 ng/L — ABNORMAL HIGH (ref <18)

## 2021-02-17 LAB — HEPATITIS B SURFACE ANTIBODY, QUANTITATIVE: Hep B S AB Quant (Post): <3.1 m[IU]/mL — ABNORMAL LOW (ref >9.9)

## 2021-02-17 LAB — HEMOGLOBIN A1C
Hgb A1c MFr Bld: 5.9 % — ABNORMAL HIGH (ref 4.8–5.6)
Mean Plasma Glucose: 123 mg/dL

## 2021-02-17 MED ORDER — PEG-KCL-NACL-NASULF-NA ASC-C 100 G PO SOLR: 1.0000 | Freq: Once | ORAL | Status: DC

## 2021-02-17 MED ORDER — MINERAL OIL RE ENEM
2.0000 | ENEMA | Freq: Once | RECTAL | Status: AC
  Administered 2021-02-18: 2 via RECTAL
  Filled 2021-02-17: qty 2

## 2021-02-17 MED ORDER — FLEET ENEMA 7-19 GM/118ML RE ENEM: 2.0000 | ENEMA | Freq: Once | RECTAL | Status: DC

## 2021-02-17 MED ORDER — PEG-KCL-NACL-NASULF-NA ASC-C 100 G PO SOLR: 0.5000 | Freq: Once | ORAL | Status: DC

## 2021-02-17 MED ORDER — PEG-KCL-NACL-NASULF-NA ASC-C 100 G PO SOLR
0.5000 | Freq: Once | ORAL | Status: AC
  Administered 2021-02-17: 100 g via ORAL
  Filled 2021-02-17: qty 1

## 2021-02-17 NOTE — TOC Initial Note (Signed)
Transition of Care [facility]) - Initial/Assessment Note    Patient Details  Name: [name] MRN: [id number] Date of Birth: April 22, 1940  [facility] CM/SW Contact:    Tom-Johnson, Renea Ee, RN Phone Number: 02/17/2021, 3:08 PM  Clinical Narrative:                 CM spoke with patient at bedside about needs for post hospital transition. [name], [name] at bedside. Patient states her son, [name] lives with her and she has two adult children whom are supportive. Admitted for Blood loss Anemia with hgb at 6.8, 2 Units PRBC given and now hgb is 8.3. Has a cane, walker, wheelchair, home Oxygen 2-4L. Patient and [name] expressed their dissatisfaction with [employer], the company servicing their Oxygen supplies. Requesting to be referred to [employer] as patient's husband had used their services. CM called and spoke with Brenton Grills, liaison with [employer] and he will call daughter. CM awaiting his call back.  Has a colostomy and does self care at home. Gets supplies from [employer] [phone]). States she has enough supplies at home and will order when she needs to.  PCP is Wannetta Sender, FNP and uses [employer]. Family transports to her dialysis appointments on TTS schedule. No PT/OT followup noted. CM will continue to follow with needs.    Expected Discharge Plan: Home/Self Care Barriers to Discharge: Continued Medical Work up   Patient Goals and CMS Choice Patient states their goals for this hospitalization and ongoing recovery are:: To return home CMS Medicare.gov Compare Post Acute Care list provided to:: Patient Choice offered to / list presented to : Patient, Adult Children (Daughter, [name])  Expected Discharge Plan and Services Expected Discharge Plan: Home/Self Care   Discharge Planning Services: CM Consult Post Acute Care Choice: NA Living arrangements for the past 2 months: Single Family Home                            HH Arranged: NA [place] Agency: NA        Prior Living Arrangements/Services Living arrangements for the past 2 months: Single Family Home Lives with:: Adult Children (Son, [name]) Patient language and need for interpreter reviewed:: Yes Do you feel safe going back to the place where you live?: Yes      Need for Family Participation in Patient Care: Yes (Comment) Care giver support system in place?: Yes (comment) Current home services: DME ([place], Rollator, wheelchair, shower chair.) Criminal Activity/Legal Involvement Pertinent to Current Situation/Hospitalization: No - Comment as needed  Activities of Daily Living Home Assistive Devices/Equipment: Environmental consultant (specify type), Eyeglasses, Wheelchair, Oxygen, Blood pressure cuff, Shower chair with back ADL Screening (condition at time of admission) Patient's cognitive ability adequate to safely complete daily activities?: Yes Is the patient deaf or have difficulty hearing?: Yes Does the patient have difficulty seeing, even when wearing glasses/contacts?: No Does the patient have difficulty concentrating, remembering, or making decisions?: No Patient able to express need for assistance with ADLs?: Yes Does the patient have difficulty dressing or bathing?: Yes Independently performs ADLs?: No Communication: Independent Dressing (OT): Needs assistance Grooming: Independent Feeding: Independent Bathing: Needs assistance Is this a change from baseline?: Pre-admission baseline Toileting: Needs assistance Is this a change from baseline?: Pre-admission baseline In/Out Bed: Needs assistance Is this a change from baseline?: Pre-admission baseline Walks in Home: Independent with device (comment) (walker at bome) Does the patient have difficulty walking or  climbing stairs?: Yes Weakness of Legs: Both Weakness of Arms/Hands: Left  Permission Sought/Granted Permission sought to share information with : Case Manager, Customer service manager,  Family Supports Permission granted to share information with : Yes, Verbal Permission Granted              Emotional Assessment Appearance:: Appears stated age Attitude/Demeanor/Rapport: Engaged, Gracious Affect (typically observed): Accepting, Appropriate, Calm, Hopeful Orientation: : Oriented to Self, Oriented to Place, Oriented to  Time, Oriented to Situation Alcohol / Substance Use: Not Applicable Psych Involvement: No (comment)  Admission diagnosis:  End stage renal disease ([place]) [N18.6] Blood loss anemia [D50.0] Anemia [D64.9] Patient Active Problem List   Diagnosis Date Noted   Anemia 02/16/2021   Pressure injury of skin 02/16/2021   Blood loss anemia 02/15/2021   MSSA bacteremia 06/20/2020   Occult blood in stools    Benign neoplasm of cecum    Benign neoplasm of ascending colon    Benign neoplasm of transverse colon    Benign neoplasm of descending colon    Loss of weight    Colonic mass    Nausea without vomiting    End stage renal disease ([place]) 06/16/2019   ESRD on dialysis ([place]) 04/10/2019   Acute on chronic combined systolic and diastolic CHF (congestive heart failure) ([place]) 08/26/2018   Pulmonary edema 08/25/2018   DOE (dyspnea on exertion) 08/25/2018   Chest pain 11/21/2016   Chronic kidney disease (CKD), active medical management without dialysis, stage 5 ([place])    Cellulitis 05/30/2015   Cellulitis, abdominal wall 05/30/2015   UTI (lower urinary tract infection) 05/30/2015   Diabetes mellitus with renal manifestation ([place]) 05/30/2015   Cancer of right renal pelvis (HCC)    CKD (chronic kidney disease), stage III (HCC)    Hypertensive heart disease    Obesity (BMI 30-39.9)    Renal mass 02/20/2015   Atrial flutter ([place]) 12/31/2014   Persistent atrial fibrillation (HCC)    Atrial fibrillation ([place]) 05/13/2014   Influenza with respiratory manifestations 04/18/2014   A-fib ([place]) 04/16/2014   Arterial hypotension    Pyrexia    Renal insufficiency     Acute on chronic diastolic ACC/AHA stage C congestive heart failure (HCC)    Chronic diastolic CHF (congestive heart failure) ([place]) 09/22/2013   Acute right-sided CHF (congestive heart failure) (HCC) 08/02/2013   Bradycardia 08/02/2013   PAF (paroxysmal atrial fibrillation) ([place]) 01/11/2013   CAD (coronary artery disease) 01/11/2013   Elevated troponin 01/11/2013   Hyperlipidemia 01/11/2013   HTN (hypertension)    Syncope 01/10/2013   DM neuropathy, type II diabetes mellitus ([place]) 01/09/2013   NSTEMI (non-ST elevated myocardial infarction) ([place]) 01/08/2013   Obesity, Class III, BMI 40-49.9 (morbid obesity) ([place]) 03/05/2010   CAROTID STENOSIS 01/29/2010   UNSPECIFIED TACHYCARDIA 01/29/2010   PALPITATIONS 01/29/2010   Hypothyroidism 02/07/2009   Anxiety state 02/07/2009   Hypotension 02/07/2009   Asthma 02/07/2009   GASTROESOPHAGEAL REFLUX DISEASE, CHRONIC 02/07/2009   Osteoarthrosis, unspecified whether generalized or localized, unspecified site 02/07/2009   DYSPHAGIA UNSPECIFIED 02/07/2009   DIVERTICULITIS, HX OF 02/07/2009   PCP:  Wannetta Sender, FNP Pharmacy:   [facility], [place] [address] Phone: [phone] Fax: [phone]     Social Determinants of Health (SDOH) Interventions    Readmission Risk Interventions No flowsheet data found.

## 2021-02-17 NOTE — Progress Notes (Signed)
[facility] Progress Note   Subjective:  Seen in room. No complaints this am. GI procedure moved to tomorrow.   Objective Vitals:   02/16/21 1629 02/16/21 2035 02/17/21 0618 02/17/21 0908  BP: (!) 100/57 121/65 (!) 106/58 125/61  Pulse: 63 66 68 61  Resp: 17 16 18 18   Temp: 98.2 F (36.8 C) 98.9 F (37.2 C) 98.6 F (37 C) 98.5 F (36.9 C)  TempSrc: Oral Oral Oral Oral  SpO2: 97% 96% 93% 100%  Weight:      Height:        Additional Objective Labs: Basic Metabolic Panel: Recent Labs  Lab 02/15/21 1307 02/16/21 0129 02/17/21 0252  NA 135 134* 135  K 3.8 3.9 4.1  CL 99 98 103  CO2 21* 23 21*  GLUCOSE 148* 164* 133*  BUN 32* 12 27*  CREATININE 9.17* 4.52* 6.64*  CALCIUM 9.0 8.3* 8.6*  PHOS  --  4.3 7.9*   CBC: Recent Labs  Lab 02/15/21 1307 02/16/21 0129 02/17/21 0252  WBC 8.6 7.7 9.6  HGB 6.8* 9.5* 8.3*  HCT 25.1* 31.3* 29.1*  MCV 79.9* 78.8* 80.6  PLT 251 172 204   Blood Culture    Component Value Date/Time   SDES BLOOD RIGHT ARM 06/23/2020 1305   SPECREQUEST  06/23/2020 1305    BOTTLES DRAWN AEROBIC ONLY Blood Culture results may not be optimal due to an inadequate volume of blood received in culture bottles   CULT  06/23/2020 1305    NO GROWTH 5 DAYS Performed at [facility], [place] [address] [place], [place] [address]    REPTSTATUS 06/28/2020 FINAL 06/23/2020 1305     Physical Exam General: Alert, lying in bed, nad  Heart: RRR No, r, g  Lungs: Clear, bilaterally  Abdomen: soft non-tender Extremities: No sig LE edema  Dialysis Access: L IJ TDC   Medications:   sodium chloride   Intravenous Once   amiodarone  200 mg Oral Daily   [START ON 02/18/2021] calcitRIOL  1 mcg Oral Q T,Th,Sa-HD   Chlorhexidine Gluconate Cloth  6 each Topical Q0600   estradiol  1 mg Oral Daily   famotidine  20 mg Oral Daily   insulin aspart  0-6 Units Subcutaneous TID WC   levothyroxine  175 mcg Oral Daily   [START ON 02/18/2021] midodrine   10 mg Oral Q T,Th,Sa-HD   [START ON 02/18/2021] mineral oil  2 enema Rectal Once   vitamin B-12  500 mcg Oral Daily    Dialysis Orders:  TTS [facility]   3.5h  2/2 bath  93.6kg  400/1.5  Heparin none  L IJ TDC (failed LUE access, failed L thigh AVG)  - mircera 225 q2, last 1/19  - calc 1 ug tiw   Assessment/Plan: Anemia, symptomatic - sp 2u prbcs on 1/21.  Hgb 8.6. GI following -for flex sig tomorrow.  ESRD - usual HD TTS.  Had HD here w/ no sig UF. Next HD 1/24.  BP/ volume - low bp's chronically, on midodrine pre HD tts. No vol ^ on exam. CXR clear.  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   MBD ckd - cont vdra. Ca in range. Phos not to goal - restart Renvela powder when taking PO.  Parox atrial fib - per pmd. On amio , eliquis.  DM - on insulin SP colostomy - w/ colon cancer surgery in 2022  Lynnda Child PA-C [place] 02/17/2021,12:18 PM

## 2021-02-17 NOTE — Progress Notes (Signed)
Progress Note   Subjective  Chief Complaint: Anemia/lynch syndrome  Patient lying in bed, daughter at bedside.  Has been NPO but we do not have sedation available today, explained to daughter and patient, will reschedule flex sig tomorrow.  Changed colostomy bag this AM, denies any blood with change or blood in the stool.  Denies nausea, vomiting, AB pain.  Continues to have cough, dyspnea, on O2.     Objective   Vital signs in last 24 hours: Temp:  [98.2 F (36.8 C)-98.9 F (37.2 C)] 98.5 F (36.9 C) (01/23 0908) Pulse Rate:  [61-68] 61 (01/23 0908) Resp:  [16-18] 18 (01/23 0908) BP: (100-125)/(57-65) 125/61 (01/23 0908) SpO2:  [93 %-100 %] 100 % (01/23 0908) Last BM Date: 02/16/21  General:   female in no acute distress, elderly appearing Heart:  regular rate and rhythm, no murmurs or gallops Pulm: Mild wheezing on exam.  Abdomen:  Soft, Obese AB, colostomy bag on right lower AB filled with liquid brown stool, no blood.  Normal bowel sounds. mild tenderness in the entire abdomen. Without guarding and Without rebound Extremities:  Without edema. Neurologic:  Alert and  oriented x4;  grossly normal neurologically.  Skin:   Dry and intact without significant lesions or rashes. Psychiatric: Cooperative. Normal mood and affect.  Intake/Output from previous day: 01/22 0701 - 01/23 0700 In: 580 [P.O.:580] Out: 800 [Stool:250] Intake/Output this shift: No intake/output data recorded.  Lab Results: Recent Labs    02/15/21 1307 02/16/21 0129 02/17/21 0252  WBC 8.6 7.7 9.6  HGB 6.8* 9.5* 8.3*  HCT 25.1* 31.3* 29.1*  PLT 251 172 204   BMET Recent Labs    02/15/21 1307 02/16/21 0129 02/17/21 0252  NA 135 134* 135  K 3.8 3.9 4.1  CL 99 98 103  CO2 21* 23 21*  GLUCOSE 148* 164* 133*  BUN 32* 12 27*  CREATININE 9.17* 4.52* 6.64*  CALCIUM 9.0 8.3* 8.6*   LFT Recent Labs    02/15/21 1307 02/17/21 0252  PROT 6.4*  --   ALBUMIN 2.9* 2.5*  AST 28  --   ALT  19  --   ALKPHOS 89  --   BILITOT 0.5  --    PT/INR No results for input(s): LABPROT, INR in the last 72 hours.  Studies/Results: DG Chest Portable 1 View  Result Date: 02/15/2021 CLINICAL DATA:  Shortness of breath. EXAM: PORTABLE CHEST 1 VIEW COMPARISON:  06/25/2020 and prior radiographs FINDINGS: Cardiomegaly and LEFT IJ dialysis catheter again noted. Mild pulmonary vascular congestion identified. There is no evidence of focal airspace disease, pulmonary edema, suspicious pulmonary nodule/mass, pleural effusion, or pneumothorax. No acute bony abnormalities are identified. IMPRESSION: Cardiomegaly with mild pulmonary vascular congestion. Electronically Signed   By: Margarette Canada M.D.   On: 02/15/2021 14:44      Impression/Plan:   81 year old female with history of colon cancer cecum and ascending colon (colon 03/12/2020) status post right hemicolectomy 04/2020, positive for MLH1 mutation of Lynch syndrome, end-stage renal on dialysis status post right nephrectomy for RCC , heart failure with preserved EF, Afib on Eliquis last dose 12/21 presents with anemia and reported hematochezia in ostomy.  Anemia HGB 8.3 (9.5 post 2 PRBC transfusion on 01/21 and 01/22, admitted 6.8) 1 gram drop in 12 hours Patient did have HSS Saturday No active bleeding at this time.  do not have sedation available today, explained to daughter and patient, will reschedule flex sig tomorrow.  Continue to monitor H/H, transfuse  with HGB goal above 7  Hematochezia in colostomy bag More so with cleaning the site or around the site per patient and daughter No more hematochezia since in the hospital  Lynch syndrome MLH1 mutation Last colon 03/12/2020 due 1 year  ESRD  HSS Tuesday, Thursday and Saturday.   Afib HFpEF Asthma S/p right nephrectomy due to [facility]    Future Appointments  Date Time Provider [place]  02/25/2021  3:00 PM Sherran Needs, NP [facility] None  03/24/2021  2:40 PM [name], Peter M,  MD [facility]      LOS: 1 day   Vladimir Crofts  02/17/2021, 9:13 AM

## 2021-02-17 NOTE — Progress Notes (Signed)
The patient endorses feeling well today with no additional bleeding from her ostomy bag. Her daughter by her bedside had many questions, which was answered this morning.  Exam: Gen: No distress Neuro: Grossly intact. Heart/Pulm: No murmur. RRR. Air entry equal with occasional faint expiratory wheeze Abd: Soft, NT/ND, + BS. Her ostomy bag was less than halfway filled with brown stool, no blood. Ext: No edema  A/P:   Symptomatic Anemia: GI bleed Her hemoglobin remains > 8 S/P transfusion of 2 units of PRBC GI plan on doing Flex Sig today or tomorrow. Monitor CBG closely. Hold pending anticoagulant GI eval. The patient and her daughter was advised we might continue to hold her anticoagulant till she sees her PCP for f/u, and they are okay with that as well.  Paroxysmal Afib: Rate controlled Continue home Amiodarone. Holding her anticoagulants.   ESRD on HD  Management by Nephro.  Her other chronic problems are stable.

## 2021-02-17 NOTE — Progress Notes (Addendum)
Family Medicine Teaching Service Daily Progress Note Intern Pager: [phone]  Patient name: [name] Medical record number: [id number] Date of birth: Sep 07, 1940 Age: 81 y.o. Gender: female  Primary Care Provider: Wannetta Sender, FNP Consultants: Nephrology Code Status: Full   Pt Overview and Major Events to Date:  1/21 - admitted, transfused 2U PRBC   Assessment and Plan: [name] is a 81 y.o. female who presented with generalized weakness and dyspnea, found to have symptomatic anemia. She is now s/p 2U PRBC. Additional PMHx is significant for ESRD on HD TTS, HFpEF, paroxysmal atrial flutter, CAD, HLD, hypothyroidism, lynch syndrome s/p right hemicolectomy.    Symptomatic Anemia Patient's Hgb this morning 8.3, down from 9.5 yesterday s/p 2u pRBCs with dialysis. 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 PT/OT consulted and recommended no follow up. -Monitor daily CBC, signs of bleeding -F/u [facility] tomorrow per GI, appreciate assistance  ESRD on HD TTS  S/p right nephrectomy for renal cell carcinoma 2017 Patient last received dialysis on evening of 1/21; next HD 1/24. She receives midodrine 10 mg with HD per nephrology. Renal function panel demonstrated Cr 6.64, Ca 8.6, P 7.9, albumin 2.5, and GFR 6. -Nephrology following, appreciate recommendations  Paroxysmal Atrial Fibrillation Patient's HR has ranged 59-68. Her ECG on admission demonstrated sinus rhythm. She is on amiodarone 200 mg daily and eliquis 5 mg BID. Eliquis has been held due to concern for bleeding; will reassess after FFS today. -Continue amiodarone -Continue holding eliquis -F/u FFS  CAP   Asthma Patient's RR has ranged 16-18 and SpO2 93-100%. She demonstrates wheezing in all lung fields on exam. Her azithromycin was stopped yesterday after completing nearly 2 weeks of abx for  CAP. CXR was clear, though she did demonstrate wheezing yesterday on exam. -Continue albuterol prn for SOB, wheezing -Continuous pulse ox  Type 2 DM  CBGs have ranged 110-184 on admission. She required 1 unit of novolog yesterday, and she has not been continued on her home lantus. -Continue novolog ssi  Hx HTN   Hypotension: chronic, stable  BP has ranged 100-121/54-65. She is on midodrine during dialysis. She does not take a daily antihypertensive. -Continue to monitor BP -Continue midodrine with dialysis   GERD   Hypothyroidism   Anxiety   S/p total hysterectomy   HFpEF Patient is on famotidine 20 mg daily for GERD, synthroid 125 mcg for hypothyroidism, clonazepam 1 mg TID prn for anxiety, and estradiol 1 mg daily for headaches after hysterectomy. She is also being monitored for stable CHF. -Continue home meds -Continue monitoring clinical status  FEN/GI: Renal/carb modified diet PPx: None given anemia Dispo:Home pending clinical improvement . Barriers include FFS, symptomatic anemia.   Subjective:  Patient and her daughter are dissatisfied with current care and communication between team members. They are amenable to continuing current plan of treatment but request better communication with the team. Patient is breathing better today with a humidifier with oxygen.  Objective: Temp:  [98.2 F (36.8 C)-99.1 F (37.3 C)] 98.6 F (37 C) (01/23 0618) Pulse Rate:  [59-68] 68 (01/23 0618) Resp:  [16-18] 18 (01/23 0618) BP: (100-121)/(54-65) 106/58 (01/23 0618) SpO2:  [93 %-100 %] 93 % (01/23 0618)  Physical Exam: General: Sitting on side of bed, conversant, in NAD Cardiovascular: Regular rate, irregular rhythm, normal S1/S2, no m/r/g Respiratory: Wheezing heard throughout all lung fields Abdomen: Non-distended Extremities: Moves all extremities  equally  Laboratory: Recent Labs  Lab 02/15/21 1307 02/16/21 0129 02/17/21 0252  WBC 8.6 7.7 9.6  HGB 6.8* 9.5* 8.3*  HCT 25.1*  31.3* 29.1*  PLT 251 172 204   Recent Labs  Lab 02/15/21 1307 02/16/21 0129 02/17/21 0252  NA 135 134* 135  K 3.8 3.9 4.1  CL 99 98 103  CO2 21* 23 21*  BUN 32* 12 27*  CREATININE 9.17* 4.52* 6.64*  CALCIUM 9.0 8.3* 8.6*  PROT 6.4*  --   --   BILITOT 0.5  --   --   ALKPHOS 89  --   --   ALT 19  --   --   AST 28  --   --   GLUCOSE 148* 164* 133*   ECG: atrial fibrillation  Signed: Mikal Plane, Medical Student 02/17/2021, 8:42 AM [place] Medical Student Pager: [phone]   I was personally present and performed or re-performed the history, physical exam and medical decision making activities of this service and have verified that the service and findings are accurately documented in the students note.  Shary Key, DO                  02/18/2021, 8:46 AM

## 2021-02-17 NOTE — Progress Notes (Signed)
FPTS Brief Progress Note  S:Came to patient's bed side for night round. Patient was laying down asleep with daughter at bedside who reports patient hasn't been doing well the last 3 hours after taking her bowel prep for her scheduled flexible sigmoidoscopy.  Per daughter patient  started having chest pain and abdominal pain in the last 3 hrs and saw her HR get up to 160. Patient woke up during the conversation  and described the chest pain as pressure in the substernal area that radiate to the left arm and abdominal tenderness she has diffused more significant around the colostomy bag.  Patient reports she has similar symptom when she had MiraLAX in the past for bowel cleanout regimen.  Daughter reported patient was informed that she was going to get two enemas and wouldn't have to use MiraLAX.  In addition she complained about communication with the team and that her and her mother had to clean the colostomy bag themselves and expressed her frustration.    O: BP 121/68    Pulse 67    Temp 98.1 F (36.7 C)    Resp 18    Ht 5\' 2"  (1.575 m)    Wt 98.8 kg    SpO2 94%    BMI 39.84 kg/m   General:laying in bed,  HEENT: Atraumatic, MMM, No sclera icterus CV: RRR, no murmurs, normal S1/S2 Pulm: CTAB, good WOB on RA, no crackles or wheezing Abd: Soft, no distension, mildly tenderness, colostomy bag is clean with no visible drainage Skin: dry, warm, pale Ext: No BLE edema   A/P: Chest Pain Patient complained of chest pain that radiates to the shoulder. With daughter reporting HR up to the 160s. Concerning for ACS vs GI cause from Miralax.  Will monitor closely and follow up with the studies. -Obtain troponin -Obtain 12-lead EKG -Obtained checks x-ray  Abdominal Pain Patient described her abdominal pain is diffused pain worse close to the colostomy bag.  Pain is similar to previous history with MiraLAX.  Abdominal pain is possible due to cramps 2/2 to miralax. -Discontinued MiraLAX -To complete  flexible sigmoidoscopy today per GI plan -continue plan per day team  - Orders reviewed. Labs for AM ordered, which was adjusted as needed.    [name], MD 02/17/2021, 10:38 PM PGY-1, [place] [facility] Night Resident  Please page [phone] with questions.

## 2021-02-18 ENCOUNTER — Encounter (HOSPITAL_COMMUNITY)
Admission: EM | Disposition: A | Discharge status: Home / Self Care | Payer: Self-pay | Source: Home / Self Care | Attending: Family Medicine

## 2021-02-18 ENCOUNTER — Encounter (HOSPITAL_COMMUNITY): Payer: Self-pay | Admitting: Family Medicine

## 2021-02-18 ENCOUNTER — Other Ambulatory Visit (HOSPITAL_COMMUNITY): Payer: Medicare HMO

## 2021-02-18 DIAGNOSIS — I5032 Chronic diastolic (congestive) heart failure: Secondary | ICD-10-CM | POA: Diagnosis not present

## 2021-02-18 DIAGNOSIS — I2510 Atherosclerotic heart disease of native coronary artery without angina pectoris: Secondary | ICD-10-CM

## 2021-02-18 DIAGNOSIS — D500 Iron deficiency anemia secondary to blood loss (chronic): Secondary | ICD-10-CM | POA: Diagnosis not present

## 2021-02-18 DIAGNOSIS — N186 End stage renal disease: Secondary | ICD-10-CM | POA: Diagnosis not present

## 2021-02-18 DIAGNOSIS — R072 Precordial pain: Secondary | ICD-10-CM

## 2021-02-18 LAB — RENAL FUNCTION PANEL
Albumin: 2.7 g/dL — ABNORMAL LOW (ref 3.5–5.0)
Anion gap: 15 (ref 5–15)
BUN: 31 mg/dL — ABNORMAL HIGH (ref 8–23)
CO2: 19 mmol/L — ABNORMAL LOW (ref 22–32)
Calcium: 9.1 mg/dL (ref 8.9–10.3)
Chloride: 102 mmol/L (ref 98–111)
Creatinine, Ser: 8.22 mg/dL — ABNORMAL HIGH (ref 0.44–1.00)
GFR, Estimated: 5 mL/min — ABNORMAL LOW (ref >60)
Glucose, Bld: 151 mg/dL — ABNORMAL HIGH (ref 70–99)
Phosphorus: 8.8 mg/dL — ABNORMAL HIGH (ref 2.5–4.6)
Potassium: 4.7 mmol/L (ref 3.5–5.1)
Sodium: 136 mmol/L (ref 135–145)

## 2021-02-18 LAB — CBC
HCT: 32.4 % — ABNORMAL LOW (ref 36.0–46.0)
Hemoglobin: 9.5 g/dL — ABNORMAL LOW (ref 12.0–15.0)
MCH: 23.5 pg — ABNORMAL LOW (ref 26.0–34.0)
MCHC: 29.3 g/dL — ABNORMAL LOW (ref 30.0–36.0)
MCV: 80.2 fL (ref 80.0–100.0)
Platelets: 219 10*3/uL (ref 150–400)
RBC: 4.04 MIL/uL (ref 3.87–5.11)
RDW: 19.5 % — ABNORMAL HIGH (ref 11.5–15.5)
WBC: 8.6 10*3/uL (ref 4.0–10.5)
nRBC: 0 % (ref 0.0–0.2)

## 2021-02-18 LAB — GLUCOSE, CAPILLARY
Glucose-Capillary: 104 mg/dL — ABNORMAL HIGH (ref 70–99)
Glucose-Capillary: 97 mg/dL (ref 70–99)
Glucose-Capillary: 168 mg/dL — ABNORMAL HIGH (ref 70–99)
Glucose-Capillary: 128 mg/dL — ABNORMAL HIGH (ref 70–99)

## 2021-02-18 LAB — TROPONIN I (HIGH SENSITIVITY): Troponin I (High Sensitivity): 21 ng/L — ABNORMAL HIGH (ref <18)

## 2021-02-18 SURGERY — SIGMOIDOSCOPY, FLEXIBLE
Anesthesia: Monitor Anesthesia Care

## 2021-02-18 MED ORDER — METOCLOPRAMIDE HCL 5 MG/ML IJ SOLN
5.0000 mg | Freq: Once | INTRAMUSCULAR | Status: DC
  Filled 2021-02-18: qty 2

## 2021-02-18 MED ORDER — PEG 3350-KCL-NA BICARB-NACL 420 G PO SOLR
4000.0000 mL | Freq: Once | ORAL | Status: AC
  Administered 2021-02-18: 4000 mL via ORAL
  Filled 2021-02-18: qty 4000

## 2021-02-18 MED ORDER — HEPARIN SODIUM (PORCINE) 1000 UNIT/ML IJ SOLN
INTRAMUSCULAR | Status: AC
  Administered 2021-02-18: 1000 [IU]
  Filled 2021-02-18: qty 5

## 2021-02-18 NOTE — Progress Notes (Signed)
FPTS Brief Progress Note  S: Went to see patient is for night rounds.  She was awake and sitting on the bed with daughter at bedside.  Says she is doing well and denies having any chest pains.  She is worried about her colonoscopy scheduled for tomorrow.  Otherwise has no complaint at this time.  O: BP 104/65 (BP Location: Right Arm)    Pulse (!) 44    Temp 98 F (36.7 C)    Resp 18    Ht 5\' 2"  (1.575 m)    Wt 92 kg    SpO2 96%    BMI 37.10 kg/m   General:Awake, well appearing, NAD CV: RRR, no murmurs Pulm: CTAB, good WOB on RA Abd: Soft, no distension, no tenderness Ext: No BLE edema    A/P: -Patient to have colonoscopy tomorrow -Continue plans per day team. - Orders reviewed. Labs for AM ordered, which was adjusted as needed.   [name], MD 02/18/2021, 9:06 PM PGY-1, [facility] Family Medicine Night Resident  Please page [phone] with questions.

## 2021-02-18 NOTE — Progress Notes (Signed)
°  [facility] Progress Note   Subjective:  Seen in room. Didn't tolerate GI prep well last night. Has episode of tachycardia last night.   Objective Vitals:   02/17/21 0618 02/17/21 0908 02/17/21 1638 02/17/21 2126  BP: (!) 106/58 125/61 (!) 111/59 121/68  Pulse: 68 61 (!) 58 67  Resp: 18 18 20 18   Temp: 98.6 F (37 C) 98.5 F (36.9 C) 97.8 F (36.6 C) 98.1 F (36.7 C)  TempSrc: Oral Oral Oral   SpO2: 93% 100% 100% 94%  Weight:      Height:        Additional Objective Labs: Basic Metabolic Panel: Recent Labs  Lab 02/16/21 0129 02/17/21 0252 02/18/21 0031  NA 134* 135 136  K 3.9 4.1 4.7  CL 98 103 102  CO2 23 21* 19*  GLUCOSE 164* 133* 151*  BUN 12 27* 31*  CREATININE 4.52* 6.64* 8.22*  CALCIUM 8.3* 8.6* 9.1  PHOS 4.3 7.9* 8.8*    CBC: Recent Labs  Lab 02/15/21 1307 02/16/21 0129 02/17/21 0252 02/18/21 0031  WBC 8.6 7.7 9.6 8.6  HGB 6.8* 9.5* 8.3* 9.5*  HCT 25.1* 31.3* 29.1* 32.4*  MCV 79.9* 78.8* 80.6 80.2  PLT 251 172 204 219    Blood Culture    Component Value Date/Time   SDES BLOOD RIGHT ARM 06/23/2020 1305   SPECREQUEST  06/23/2020 1305    BOTTLES DRAWN AEROBIC ONLY Blood Culture results may not be optimal due to an inadequate volume of blood received in culture bottles   CULT  06/23/2020 1305    NO GROWTH 5 DAYS Performed at [facility], [place] [address] [place], [place] [address]    REPTSTATUS 06/28/2020 FINAL 06/23/2020 1305     Physical Exam General: Alert, lying in bed, nad  Heart: RRR No, r, g  Lungs: Clear, bilaterally  Abdomen: soft non-tender Extremities: No sig LE edema  Dialysis Access: L IJ TDC   Medications:   sodium chloride   Intravenous Once   amiodarone  200 mg Oral Daily   calcitRIOL  1 mcg Oral Q T,Th,Sa-HD   Chlorhexidine Gluconate Cloth  6 each Topical Q0600   estradiol  1 mg Oral Daily   famotidine  20 mg Oral Daily   insulin aspart  0-6 Units Subcutaneous TID WC   levothyroxine  175  mcg Oral Daily   midodrine  10 mg Oral Q T,Th,Sa-HD   vitamin B-12  500 mcg Oral Daily    Dialysis Orders:  TTS [facility]   3.5h  2/2 bath  93.6kg  400/1.5  Heparin none  L IJ TDC (failed LUE access, failed L thigh AVG)  - mircera 225 q2, last 1/19  - calc 1 ug tiw   Assessment/Plan: Anemia, symptomatic - sp 2u prbcs on 1/21.  Hgb 8-9s stable. Plans per GI  ESRD - usual HD TTS.  Had HD here w/ no sig UF. Next HD 1/24.  BP/ volume - low bp's chronically, on midodrine pre HD tts. No vol ^ on exam. CXR clear.  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   MBD ckd - cont vdra. Ca in range. Phos not to goal - restart Renvela powder when taking PO.  Parox atrial fib - per pmd. On amio , eliquis.  DM - on insulin SP colostomy - w/ colon cancer surgery in 2022  Pepper Pike 02/18/2021,11:54 AM

## 2021-02-18 NOTE — Progress Notes (Addendum)
Family Medicine Teaching Service Daily Progress Note Intern Pager: [phone]  Patient name: [name] Medical record number: [id number] Date of birth: 1940-11-13 Age: 81 y.o. Gender: female  Primary Care Provider: Wannetta Sender, FNP Consultants: Nephrology, GI Code Status: Full  Pt Overview and Major Events to Date:  1/21 - admitted, transfused 2u pRBCs for anemia 1/23 - chest pain with bowel prep, initial workup negative  Assessment and Plan: [name] is a 81 y.o. female who presented with generalized weakness and dyspnea, found to have symptomatic anemia. She is now s/p 2U PRBC. Additional PMHx is significant for ESRD on HD TTS, HFpEF, paroxysmal atrial flutter, CAD, HLD, hypothyroidism, and lynch syndrome s/p right hemicolectomy.    Symptomatic Anemia Patient's Hgb this morning improved from 8.3>9.5 this morning. MCV is stable at 80.2. Suspect anemia is due to colonic source given patient's history of Lynch syndrome and known invasive adenocarcinoma. Flexible sigmoidoscopy was changed to colonoscopy by GI due to the need for the procedure soon anyway. The colonoscopy was due for this morning; however, patient experienced chest pain with moviprep bowel preparation overnight which led to cardiac workup as below. GI was consulted about the need for cardiac clearance before undergoing the procedure this morning which was confirmed. Cardiology was consulted to clear patient, will await recommendations before rescheduling colonoscopy. -F/u cardiology consult, GI scheduling for colonoscopy; appreciate assistance -Monitor daily CBC, signs of bleeding  Chest pain Patient noted transient chest pain with bowel preparation which she states had happened before. Troponins were flat at 21. ECG demonstrated first degree AV block with PR interval 264 ms. Given PR interval <400 ms, will continue to monitor. Do not suspect ACS at this time. 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 She follows with Dr. [name] with [facility]. Cardiology was consulted in hospital for clearance before colonoscopy. -F/u cardiology recommendations -Monitor clinical status   ESRD on HD TTS  S/p right nephrectomy for renal cell carcinoma 2017 Patient is due for dialysis today. Renal function panel this morning demonstrated Cr 6.64>8.22. -F/u patient after HD -Continue midodrine 10 mg with HD -Nephrology following, appreciate recommendations   Paroxysmal Atrial Fibrillation Patient's HR overnight ranged 58-67. ECG this morning did not demonstrate afib. -Continue amiodarone, holding eliquis until after GI   CAP   Asthma Patient is breathing comfortably with RR 18-20 and SpO2 94-100% on varying levels of [place] per daughter changing settings. She is breathing comfortably on exam. -Continue albuterol prn for SOB, wheezing -Continue pulse ox   Type 2 DM  Patient's CBG have ranged 104-147 overnight. She did not receive novolog overnight. -Continue novolog ssi   Hx HTN   Hypotension: chronic, stable  Patient's BP is stable at 111-121/59-68. -Continue to monitor   GERD   Hypothyroidism   Anxiety   S/p total hysterectomy   HFpEF Patient's conditions are stable. -Continue synthroid 125 mcg daily, famotidine 20 mg daily, clonazepam 1 mg TID prn, and estradiol 1 mg daily -Continue to monitor   FEN/GI: Renal/carb modified diet PPx: None given persistent anemia Dispo:Home pending clinical improvement . Barriers include colonoscopy symptomatic anemia.  Subjective:  Patient is overall doing well this morning. She is a little frustrated about her procedure being delayed pending cardiology recommendations.  Objective: Temp:  [97.8 F (36.6 C)-98.5 F (36.9  C)] 98.1 F (36.7  C) (01/23 2126) Pulse Rate:  [58-67] 67 (01/23 2126) Resp:  [18-20] 18 (01/23 2126) BP: (111-125)/(59-68) 121/68 (01/23 2126) SpO2:  [94 %-100 %] 94 % (01/23 2126)  Physical Exam: General: Lying in bed, conversant, in NAD Cardiovascular: Regular rate Respiratory: Breathing comfortably on [place] Abdomen: Nondistended Extremities: Moves all extremities grossly equally  Laboratory: Recent Labs  Lab 02/16/21 0129 02/17/21 0252 02/18/21 0031  WBC 7.7 9.6 8.6  HGB 9.5* 8.3* 9.5*  HCT 31.3* 29.1* 32.4*  PLT 172 204 219   Recent Labs  Lab 02/15/21 1307 02/16/21 0129 02/17/21 0252 02/18/21 0031  NA 135 134* 135 136  K 3.8 3.9 4.1 4.7  CL 99 98 103 102  CO2 21* 23 21* 19*  BUN 32* 12 27* 31*  CREATININE 9.17* 4.52* 6.64* 8.22*  CALCIUM 9.0 8.3* 8.6* 9.1  PROT 6.4*  --   --   --   BILITOT 0.5  --   --   --   ALKPHOS 89  --   --   --   ALT 19  --   --   --   AST 28  --   --   --   GLUCOSE 148* 164* 133* 151*   Signed: Mikal Plane, Medical Student 02/18/2021, 8:36 AM [place] Student Pager: [phone]   I was personally present and re-performed the exam and MDM and verified the service and findings are accurately documented in the student's note. [place], DO 02/18/21 2:05 PM

## 2021-02-18 NOTE — H&P (View-Only) (Signed)
Progress Note   Subjective  Chief Complaint: Anemia/lynch syndrome   Patient's been having hematochezia in her ostomy bag, will set up for flex sig only but added on colonoscopy through ostomy of remnant colon. [name] states that last time patient had poor reaction to prep, with elevated heart rate and chest pain.  Patient began drinking the prep last night at 9:00 but had heart rate up to 160 with diffuse radiating chest discomfort into her shoulder. Patient stopped prep, medical team has initiated a work-up since patient has also been having worsening working of breathing since being here and some vascular congestion.  Pending echo, troponin 21.  Patient and daughter thought would not have to have evaluation through through her stoma of remnant colon per her surgeon, Dr. Drue Flirt.  Wants [place] to call Dr. Geronimo Boot office and would like for Dr. Drue Flirt to Dr. Tarri Glenn to collaborate. [name] feels has been lack of communication with the hospital in general with nurses to providers, spent very long time discussing the case with them. Dr. Tarri Glenn to discuss with Dr. Geronimo Boot nurse and called, when I went back into the room patient's daughter just gotten off the phone with Dr. Drue Flirt. Explained that they were advised to have a complete colonoscopy to evaluate remnant colon through ostomy.  Patient also had poor reaction to enemas from flex sigmoidoscopy, states it was very painful history of hemorrhoids. This time patient resting comfortably in bed, daughter at bedside. Stating she has always had chest pain, similar to this especially when she went to atrial fibrillation.  Denies any at this moment. Denies nausea, vomiting. Has some pain especially at ostomy site, mild tenderness. Does continue to have coughing with shortness of breath.    Objective   Vital signs in last 24 hours: Temp:  [97.8 F (36.6 C)-98.5 F (36.9 C)] 98.1 F (36.7 C) (01/23 2126) Pulse  Rate:  [58-67] 67 (01/23 2126) Resp:  [18-20] 18 (01/23 2126) BP: (111-125)/(59-68) 121/68 (01/23 2126) SpO2:  [94 %-100 %] 94 % (01/23 2126) Last BM Date: 02/16/21  General:   female in no acute distress, elderly appearing Heart:  regular rate and rhythm, no murmurs or gallops Pulm: Mild wheezing on exam.  Abdomen:  Soft, Obese AB, colostomy bag on right lower AB filled with liquid brown stool, no blood.  Normal bowel sounds. mild tenderness in the entire abdomen. Without guarding and Without rebound Extremities:  Without edema. Neurologic:  Alert and  oriented x4;  grossly normal neurologically.  Skin:   Dry and intact without significant lesions or rashes. Psychiatric: Cooperative. Normal mood and affect.  Intake/Output from previous day: 01/23 0701 - 01/24 0700 In: 600 [P.O.:600] Out: 152 [Urine:1; Emesis/NG output:1; Stool:150] Intake/Output this shift: No intake/output data recorded.  Lab Results: Recent Labs    02/16/21 0129 02/17/21 0252 02/18/21 0031  WBC 7.7 9.6 8.6  HGB 9.5* 8.3* 9.5*  HCT 31.3* 29.1* 32.4*  PLT 172 204 219   BMET Recent Labs    02/16/21 0129 02/17/21 0252 02/18/21 0031  NA 134* 135 136  K 3.9 4.1 4.7  CL 98 103 102  CO2 23 21* 19*  GLUCOSE 164* 133* 151*  BUN 12 27* 31*  CREATININE 4.52* 6.64* 8.22*  CALCIUM 8.3* 8.6* 9.1   LFT Recent Labs    02/15/21 1307 02/17/21 0252 02/18/21 0031  PROT 6.4*  --   --   ALBUMIN 2.9*   < > 2.7*  AST 28  --   --  ALT 19  --   --   ALKPHOS 89  --   --   BILITOT 0.5  --   --    < > = values in this interval not displayed.   PT/INR No results for input(s): LABPROT, INR in the last 72 hours.  Studies/Results: DG CHEST PORT 1 VIEW  Result Date: 02/17/2021 CLINICAL DATA:  Chest pain and abnormal labs with low hemoglobin. EXAM: PORTABLE CHEST 1 VIEW COMPARISON:  Portable chest 02/15/2021. FINDINGS: The heart is silhouette is moderately enlarged but no perihilar vascular congestion is seen.  Linear scarring again noted left mid field. No focal pneumonia is evident or significant pleural collections. The aortic arch is heavily calcified with stable mediastinal configuration with mild aortic tortuosity. A left chest loop recorder device is again noted as well as a left IJ approach dialysis catheter terminating in the upper right atrium. There is no pneumothorax. IMPRESSION: Cardiomegaly without evidence of acute chest process or interval changes. Aortic atherosclerosis. Electronically Signed   By: Telford Nab M.D.   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  Anemia HGB 8.3-->9.5 today (9.5 post 2 PRBC transfusion on 01/21 and 01/22, admitted 6.8) Patient due for HHS today No active bleeding at this time.  Stable H/H  Continue to monitor H/H, transfuse with HGB goal above 7  Hematochezia in colostomy bag More so with cleaning the site or around the site per patient and daughter No more hematochezia since in the hospital Patient discussed with Dr. Geronimo Boot office, understands that they do need complete colonoscopy to evaluate remnant colon and have agreed to this. Will schedule for colonoscopy and flex sig tomorrow. Plan for soapsuds enemas, and will do GoLytely with Reglan 5 mg and Dulcolax. If patient has any chest pain or shortness of breath, stop prep and try very small sips with straw. [phone] PAL for atrium  Chest pain in setting of Afib/HFpEF with shortness of breath Procedure was canceled, getting cardiac evaluation now-still need cardiac clearance. 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 fib and chest pain.  Lynch syndrome MLH1 mutation Last colon 03/12/2020 due 1 year- due for evaluation  ESRD  HSS Tuesday, Thursday and Saturday.   Asthma S/p right nephrectomy due to [facility]    Future Appointments  Date Time Provider [place]  02/18/2021 12:00 PM [facility] ECHO 7 [facility]  02/25/2021  3:00 PM Sherran Needs, NP [facility]-AFIBC None  03/24/2021  2:40 PM [name], Peter M, MD [facility]NORTHLIN [facility]      LOS: 2 days   Vladimir Crofts  02/18/2021, 8:45 AM

## 2021-02-18 NOTE — Progress Notes (Addendum)
Pt receives out-pt HD at [facility] on TTS. Pt arrives at 11:30 for 11:50 chair time. Will assist as needed.   Melven Sartorius Renal Navigator [phone]

## 2021-02-18 NOTE — Progress Notes (Addendum)
Progress Note   Subjective  Chief Complaint: Anemia/lynch syndrome   Patient's been having hematochezia in her ostomy bag, will set up for flex sig only but added on colonoscopy through ostomy of remnant colon. [name] states that last time patient had poor reaction to prep, with elevated heart rate and chest pain.  Patient began drinking the prep last night at 9:00 but had heart rate up to 160 with diffuse radiating chest discomfort into her shoulder. Patient stopped prep, medical team has initiated a work-up since patient has also been having worsening working of breathing since being here and some vascular congestion.  Pending echo, troponin 21.  Patient and daughter thought would not have to have evaluation through through her stoma of remnant colon per her surgeon, Dr. Drue Flirt.  Wants [place] to call Dr. Geronimo Boot office and would like for Dr. Drue Flirt to Dr. Tarri Glenn to collaborate. [name] feels has been lack of communication with the hospital in general with nurses to providers, spent very long time discussing the case with them. Dr. Tarri Glenn to discuss with Dr. Geronimo Boot nurse and called, when I went back into the room patient's daughter just gotten off the phone with Dr. Drue Flirt. Explained that they were advised to have a complete colonoscopy to evaluate remnant colon through ostomy.  Patient also had poor reaction to enemas from flex sigmoidoscopy, states it was very painful history of hemorrhoids. This time patient resting comfortably in bed, daughter at bedside. Stating she has always had chest pain, similar to this especially when she went to atrial fibrillation.  Denies any at this moment. Denies nausea, vomiting. Has some pain especially at ostomy site, mild tenderness. Does continue to have coughing with shortness of breath.    Objective   Vital signs in last 24 hours: Temp:  [97.8 F (36.6 C)-98.5 F (36.9 C)] 98.1 F (36.7 C) (01/23 2126) Pulse  Rate:  [58-67] 67 (01/23 2126) Resp:  [18-20] 18 (01/23 2126) BP: (111-125)/(59-68) 121/68 (01/23 2126) SpO2:  [94 %-100 %] 94 % (01/23 2126) Last BM Date: 02/16/21  General:   female in no acute distress, elderly appearing Heart:  regular rate and rhythm, no murmurs or gallops Pulm: Mild wheezing on exam.  Abdomen:  Soft, Obese AB, colostomy bag on right lower AB filled with liquid brown stool, no blood.  Normal bowel sounds. mild tenderness in the entire abdomen. Without guarding and Without rebound Extremities:  Without edema. Neurologic:  Alert and  oriented x4;  grossly normal neurologically.  Skin:   Dry and intact without significant lesions or rashes. Psychiatric: Cooperative. Normal mood and affect.  Intake/Output from previous day: 01/23 0701 - 01/24 0700 In: 600 [P.O.:600] Out: 152 [Urine:1; Emesis/NG output:1; Stool:150] Intake/Output this shift: No intake/output data recorded.  Lab Results: Recent Labs    02/16/21 0129 02/17/21 0252 02/18/21 0031  WBC 7.7 9.6 8.6  HGB 9.5* 8.3* 9.5*  HCT 31.3* 29.1* 32.4*  PLT 172 204 219   BMET Recent Labs    02/16/21 0129 02/17/21 0252 02/18/21 0031  NA 134* 135 136  K 3.9 4.1 4.7  CL 98 103 102  CO2 23 21* 19*  GLUCOSE 164* 133* 151*  BUN 12 27* 31*  CREATININE 4.52* 6.64* 8.22*  CALCIUM 8.3* 8.6* 9.1   LFT Recent Labs    02/15/21 1307 02/17/21 0252 02/18/21 0031  PROT 6.4*  --   --   ALBUMIN 2.9*   < > 2.7*  AST 28  --   --  ALT 19  --   --   ALKPHOS 89  --   --   BILITOT 0.5  --   --    < > = values in this interval not displayed.   PT/INR No results for input(s): LABPROT, INR in the last 72 hours.  Studies/Results: DG CHEST PORT 1 VIEW  Result Date: 02/17/2021 CLINICAL DATA:  Chest pain and abnormal labs with low hemoglobin. EXAM: PORTABLE CHEST 1 VIEW COMPARISON:  Portable chest 02/15/2021. FINDINGS: The heart is silhouette is moderately enlarged but no perihilar vascular congestion is seen.  Linear scarring again noted left mid field. No focal pneumonia is evident or significant pleural collections. The aortic arch is heavily calcified with stable mediastinal configuration with mild aortic tortuosity. A left chest loop recorder device is again noted as well as a left IJ approach dialysis catheter terminating in the upper right atrium. There is no pneumothorax. IMPRESSION: Cardiomegaly without evidence of acute chest process or interval changes. Aortic atherosclerosis. Electronically Signed   By: Telford Nab M.D.   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  Anemia HGB 8.3-->9.5 today (9.5 post 2 PRBC transfusion on 01/21 and 01/22, admitted 6.8) Patient due for HHS today No active bleeding at this time.  Stable H/H  Continue to monitor H/H, transfuse with HGB goal above 7  Hematochezia in colostomy bag More so with cleaning the site or around the site per patient and daughter No more hematochezia since in the hospital Patient discussed with Dr. Geronimo Boot office, understands that they do need complete colonoscopy to evaluate remnant colon and have agreed to this. Will schedule for colonoscopy and flex sig tomorrow. Plan for soapsuds enemas, and will do GoLytely with Reglan 5 mg and Dulcolax. If patient has any chest pain or shortness of breath, stop prep and try very small sips with straw. [id number] PAL for atrium  Chest pain in setting of Afib/HFpEF with shortness of breath Procedure was canceled, getting cardiac evaluation now-still need cardiac clearance. 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 fib and chest pain.  Lynch syndrome MLH1 mutation Last colon 03/12/2020 due 1 year- due for evaluation  ESRD  HSS Tuesday, Thursday and Saturday.   Asthma S/p right nephrectomy due to [facility]    Future Appointments  Date Time Provider [place]  02/18/2021 12:00 PM [facility] ECHO 7 [facility]  02/25/2021  3:00 PM Sherran Needs, NP [facility] None  03/24/2021  2:40 PM [name], Peter M, MD [facility]      LOS: 2 days   Vladimir Crofts  02/18/2021, 8:45 AM

## 2021-02-18 NOTE — Plan of Care (Signed)
  Problem: Education: Goal: Knowledge of General Education information will improve Description: Including pain rating scale, medication(s)/side effects and non-pharmacologic comfort measures Outcome: Progressing   Problem: Clinical Measurements: Goal: Diagnostic test results will improve Outcome: Progressing   Problem: Coping: Goal: Level of anxiety will decrease Outcome: Progressing   Problem: Pain Managment: Goal: General experience of comfort will improve Outcome: Progressing   

## 2021-02-18 NOTE — Progress Notes (Signed)
Patient remains alert and oriented. Bowel preparation for possible colonoscopy initiated this morning @ 0430 in the left lateral position after consent received and procedure explained. I of 2 Mineral oil enema ejected into rectum with ease. Patient started screaming during the second administration claiming it hurts and she has hemorrhoids.I had to stop the administration and rather make her comfortable. VS were stable. Patient remains in NSR on telemetry. Reassured. Same to be communicated to team in the morning. Safety maintained at all times.

## 2021-02-18 NOTE — Consult Note (Addendum)
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  with no evidence of aortic valve  stenosis.   8. There is mild (Grade II) layered plaque involving the transverse aorta  and descending aorta.   9. Agitated saline contrast bubble study was positive with shunting  observed within 3-6 cardiac cycles suggestive of interatrial shunt. There  is a small patent foramen ovale with predominantly right to left shunting  across the atrial septum.   Conclusion(s)/Recommendation(s): No evidence of vegetation/infective  endocarditis on this transesophageal  echocardiogram.   Cath 02/2014 Cardiac Catheterization Procedure Note   Name: GAGANDEEP KOSSMAN MRN: [id number] DOB: Jan 19, 1941   Procedure: Right Heart Cath, Left Heart Cath, Selective Coronary Angiography, LV angiography   Indication: Shortness  of breath, chest pain concerning for USAP   Procedural Details:  The right wrist was then prepped, draped, and anesthetized with 1% lidocaine. Using the modified Seldinger technique a 5/6 French Slender sheath was placed in the right radial artery. Intra-arterial verapamil was administered through the radial artery sheath. IV heparin was administered after a JR4 catheter was advanced into the central aorta. Access of the RFA was obtained via a front wall venous puncture and a 7 Fr sheath was placed. A Swan-Ganz catheter was used for the right heart catheterization. Standard protocol was followed for recording of right heart pressures and sampling of oxygen saturations. Fick cardiac output was calculated. Standard Judkins catheters were used for selective coronary angiography and left ventriculography. There were no immediate procedural complications. The patient was transferred to the post catheterization recovery area for further monitoring.   Procedural Findings: Hemodynamics RA 10 RV 46/14 PA 43/9 mean 28 PCWP 17 LV 135/14 AO 128/59 mean 83   Oxygen saturations: PA 70 AO 96   Cardiac Output (Fick) 7.1  Cardiac Index (Fick) 3.6            Coronary angiography: Coronary dominance: right   Left mainstem: Widely patent, no obstruction   Left anterior descending (LAD): Mild diffuse mid-LAD stenosis. There is moderate calcification and the mid-vessel has diffuse 40-50% stenosis. The diagonal are patent and the first diagonal is a large vessel.    Left circumflex (LCx): There is a large intermediate branch without stenosis. The AV circumflex has an eccentric 50% stenosis unchanged from the previous study when the patient had a negative FFR.    Right coronary artery (RCA): The right cusp is heavily calcified. The ostium of the RCA has 40% stenosis without catheter dampening. The vessel is a large, dominant RCA without significant obstruction. The PDA and PLA branches are patent.    Left  ventriculography: There is mild inferior hypokinesis and the LVEF is estimated at 50-55%   Estimated Blood Loss: minimal   Final Conclusions:   1. Mild-moderate nonobstructive CAD unchanged from previous cardiac cath studies in 2014.  2.  Low-normal LV systolic function 3. Mild elevation of right heart pressures with normal LV pressures, suspect diastolic dysfunction versus obesity-related. 4. Preserved cardiac output   Recommendations: Medical therapy   Sherren Mocha MD, [facility] 03/21/2014, 9:38 AM  Laboratory Data:  High Sensitivity Troponin:   Recent Labs  Lab 02/17/21 2246 02/18/21 0031  TROPONINIHS 21* 21*     Chemistry Recent Labs  Lab 02/16/21 0129 02/17/21 0252 02/18/21 0031  NA 134* 135 136  K 3.9 4.1 4.7  CL 98 103 102  CO2 23 21* 19*  GLUCOSE 164* 133* 151*  BUN 12 27* 31*  CREATININE 4.52* 6.64* 8.22*  CALCIUM 8.3* 8.6* 9.1  MG 1.8  --   --   GFRNONAA 9* 6* 5*  ANIONGAP 13 11 15     Recent Labs  Lab 02/15/21 1307 02/17/21 0252 02/18/21 0031  PROT 6.4*  --   --   ALBUMIN 2.9* 2.5* 2.7*  AST 28  --   --   ALT 19  --   --   ALKPHOS 89  --   --   BILITOT 0.5  --   --    Lipids No results for input(s): CHOL, TRIG, HDL, LABVLDL, LDLCALC, CHOLHDL in the last 168 hours.  Hematology Recent Labs  Lab 02/16/21 0129 02/17/21 0252 02/18/21 0031  WBC 7.7 9.6 8.6  RBC 3.97 3.61* 4.04  HGB 9.5* 8.3* 9.5*  HCT 31.3* 29.1* 32.4*  MCV 78.8* 80.6 80.2  MCH 23.9* 23.0* 23.5*  MCHC 30.4 28.5* 29.3*  RDW 18.2* 19.0* 19.5*  PLT 172 204 219   Thyroid  Recent Labs  Lab 02/16/21 0129  TSH 0.756    BNPNo results for input(s): BNP, PROBNP in the last 168 hours.  DDimer No results for input(s): DDIMER in the last 168 hours.   Radiology/Studies:  DG CHEST PORT 1 VIEW  Result Date: 02/17/2021 CLINICAL DATA:  Chest pain and abnormal labs with low hemoglobin. EXAM: PORTABLE CHEST 1 VIEW COMPARISON:  Portable chest 02/15/2021. FINDINGS: The heart is silhouette  is moderately enlarged but no perihilar vascular congestion is seen. Linear scarring again noted left mid field. No focal pneumonia is evident or significant pleural collections. The aortic arch is heavily calcified with stable mediastinal configuration with mild aortic tortuosity. A left chest loop recorder device is again noted as well as a left IJ approach dialysis catheter terminating in the upper right atrium. There is no pneumothorax. IMPRESSION: Cardiomegaly without evidence of acute chest process or interval changes. Aortic atherosclerosis. Electronically Signed   By: Telford Nab M.D.   On: 02/17/2021 23:16   DG Chest Portable 1 View  Result Date: 02/15/2021 CLINICAL DATA:  Shortness of breath. EXAM: PORTABLE CHEST 1 VIEW COMPARISON:  06/25/2020 and prior radiographs FINDINGS: Cardiomegaly and LEFT IJ dialysis catheter again noted. Mild pulmonary vascular congestion identified. There is no evidence of focal airspace disease, pulmonary edema, suspicious pulmonary nodule/mass, pleural effusion, or pneumothorax. No acute bony abnormalities are identified. IMPRESSION: Cardiomegaly with mild pulmonary vascular congestion. Electronically Signed   By: Margarette Canada M.D.   On: 02/15/2021 14:44     Assessment and Plan:   1. Recurrent symptomatic anemia requiring transfusion, with hematochezia in colostomy bag; asked to see for pre-op evaluation prior to colonoscopy/flex sig - GI, IM following - initial Hgb 6.8, improved to 9.5 post transfusion - Eliquis on hold - per d/w Dr. Tamala Julian, given low/flat troponins, resolution of chest pain, and potential relationship of CP to tachyarrhythmia, he suggests that  patient may proceed with GI procedure as planned as her anemia is really the primary issue at present and necessitates the GI evaluation. Her baseline comorbidities do place her at increased risk from procedural standpoint but at this time we do not feel any additional testing would ameliorate that  risk  2. Chest pain in setting of probable breakthrough tachyarrhythmia and known moderate CAD - based on history and daughter's reported HR, suspect she had breakthrough atrial fib/flutter at the time of event - which, compounded with underlying known CAD and anemia, could have reproduced symptoms (has had repeat cath in the past for similar issue) - in light of anemia/bleeding issues she is not a great candidate for invasive ischemic assessment as committing patient to additional antiplatelet therapy could be more harmful than beneficial - recommend continued conservative management; BP limits antianginal titration - 2D echo pending - not on ASA due to home Eliquis - update lipids in AM  3. Paroxysmal atrial fib/flutter, PSVT - s/p prior ablation with subsequent recurrences, managed through afib clinic - on amiodarone 200mg  daily -> current HR 50s so not good candidate for increase - previously failed Tikosyn 2/2 QT prolongation/Torsades - not a candidate for flecainide due to h/o CHF/CAD - reasonable to continue present amiodarone dose and follow up as outpatient - episodic paroxysms could be triggered by acute issue - if AF continues to be an issue, may need to consider AVN ablation + PPM through with ESRD this seems less ideal option  - re: amio use, LFTs largely OK, TSH normal, CXR without acute chest process - anticoagulation on hold due to GIB, await clearance for resumption by primary teams  4. Chronic combined CHF - last EF assessment 06/2020 by TEE was 50-55% - repeat echo pending - blood pressure has been prohibitive of aggressive GDMT; requires midodrine on HD days - volume is managed by HD  5. Trivial PFO + small pericardial effusion by TEE 06/2020 - repeat echo pending  DVT prophylaxis: Eliquis on hold, will defer to primary team   Risk Assessment/Risk Scores:     HEAR Score (for undifferentiated chest pain):  HEAR Score: 5  New York Heart Association (NYHA)  Functional Class NYHA Class III  CHA2DS2-VASc Score = 7   This indicates a 11.2% annual risk of stroke. The patient's score is based upon: CHF History: 1 HTN History: 1 Diabetes History: 1 Stroke History: 0 Vascular Disease History: 1 Age Score: 2 Gender Score: 1       For questions or updates, please contact [place] Please consult [email or web address] for contact info under    Signed, Charlie Pitter, PA-C  02/18/2021 2:52 PM

## 2021-02-18 NOTE — Progress Notes (Signed)
IVT consulted for difficult PIV placement for PRN Rx and procedure 1/25.  Pt on her way to dialysis.  RN Idamae Schuller will put in new consult when ready.

## 2021-02-18 NOTE — Progress Notes (Signed)
The patient endorses chest pain from last night exacerbated by bowel prep for endoscopy.  On exam - + mild central chest wall tenderness. Otherwise, no acute findings on her exam.  Labs: Troponin remains flat. Hemoglobin stable at 9 EKG without ischemic changes  Symptomatic Anemia/GI loss Currently stable Holding endoscopy for cards clearance.  Chest pain: Likely atypical Unstable angina is a possibility given her extensive cardiovascular hx with the stressor being bowel prep. I discussed with GI attending vis secure chat message if they would like to proceed with the procedure without cardiac clearance (cardiac clearance preferred). They agreed with holding EGD pending Cards clearance. ECHO ordered. Consult cardiology.   ESRD on HD TTS  Per nephrology   Paroxysmal Atrial Fibrillation Rate and Rhythm controlled. Holding Eliquis pending GI workup. Continue her other home regimen.  Her other chronic problems are stable.

## 2021-02-19 ENCOUNTER — Encounter (HOSPITAL_COMMUNITY)
Admission: EM | Disposition: A | Discharge status: Home / Self Care | Payer: Self-pay | Source: Home / Self Care | Attending: Family Medicine

## 2021-02-19 ENCOUNTER — Encounter (HOSPITAL_COMMUNITY): Payer: Self-pay | Admitting: Family Medicine

## 2021-02-19 ENCOUNTER — Inpatient Hospital Stay (HOSPITAL_COMMUNITY): Payer: Medicare HMO | Admitting: Anesthesiology

## 2021-02-19 ENCOUNTER — Inpatient Hospital Stay (HOSPITAL_COMMUNITY): Payer: Medicare HMO

## 2021-02-19 DIAGNOSIS — K9411 Enterostomy hemorrhage: Secondary | ICD-10-CM

## 2021-02-19 DIAGNOSIS — R079 Chest pain, unspecified: Secondary | ICD-10-CM | POA: Diagnosis not present

## 2021-02-19 DIAGNOSIS — N186 End stage renal disease: Secondary | ICD-10-CM | POA: Diagnosis not present

## 2021-02-19 DIAGNOSIS — K5521 Angiodysplasia of colon with hemorrhage: Secondary | ICD-10-CM

## 2021-02-19 DIAGNOSIS — K5520 Angiodysplasia of colon without hemorrhage: Secondary | ICD-10-CM

## 2021-02-19 DIAGNOSIS — Z1509 Genetic susceptibility to other malignant neoplasm: Secondary | ICD-10-CM

## 2021-02-19 DIAGNOSIS — Z85038 Personal history of other malignant neoplasm of large intestine: Secondary | ICD-10-CM

## 2021-02-19 DIAGNOSIS — D500 Iron deficiency anemia secondary to blood loss (chronic): Secondary | ICD-10-CM | POA: Diagnosis not present

## 2021-02-19 HISTORY — PX: HOT HEMOSTASIS: SHX5433

## 2021-02-19 HISTORY — PX: COLONOSCOPY WITH PROPOFOL: SHX5780

## 2021-02-19 LAB — ECHOCARDIOGRAM COMPLETE
AR max vel: 1.35 cm2
AV Peak grad: 12.9 mmHg
Ao pk vel: 1.8 m/s
Area-P 1/2: 2.96 cm2
Height: 62 in
S' Lateral: 3.3 cm
Weight: 3245.17 oz

## 2021-02-19 LAB — RENAL FUNCTION PANEL
Albumin: 2.5 g/dL — ABNORMAL LOW (ref 3.5–5.0)
Anion gap: 13 (ref 5–15)
BUN: 19 mg/dL (ref 8–23)
CO2: 23 mmol/L (ref 22–32)
Calcium: 8.2 mg/dL — ABNORMAL LOW (ref 8.9–10.3)
Chloride: 98 mmol/L (ref 98–111)
Creatinine, Ser: 5.75 mg/dL — ABNORMAL HIGH (ref 0.44–1.00)
GFR, Estimated: 7 mL/min — ABNORMAL LOW (ref >60)
Glucose, Bld: 115 mg/dL — ABNORMAL HIGH (ref 70–99)
Phosphorus: 6.5 mg/dL — ABNORMAL HIGH (ref 2.5–4.6)
Potassium: 4.1 mmol/L (ref 3.5–5.1)
Sodium: 134 mmol/L — ABNORMAL LOW (ref 135–145)

## 2021-02-19 LAB — GLUCOSE, CAPILLARY
Glucose-Capillary: 104 mg/dL — ABNORMAL HIGH (ref 70–99)
Glucose-Capillary: 116 mg/dL — ABNORMAL HIGH (ref 70–99)
Glucose-Capillary: 125 mg/dL — ABNORMAL HIGH (ref 70–99)
Glucose-Capillary: 248 mg/dL — ABNORMAL HIGH (ref 70–99)
Glucose-Capillary: 171 mg/dL — ABNORMAL HIGH (ref 70–99)

## 2021-02-19 LAB — CBC
HCT: 30.1 % — ABNORMAL LOW (ref 36.0–46.0)
Hemoglobin: 8.8 g/dL — ABNORMAL LOW (ref 12.0–15.0)
MCH: 23.7 pg — ABNORMAL LOW (ref 26.0–34.0)
MCHC: 29.2 g/dL — ABNORMAL LOW (ref 30.0–36.0)
MCV: 81.1 fL (ref 80.0–100.0)
Platelets: 168 10*3/uL (ref 150–400)
RBC: 3.71 MIL/uL — ABNORMAL LOW (ref 3.87–5.11)
RDW: 19.9 % — ABNORMAL HIGH (ref 11.5–15.5)
WBC: 9.3 10*3/uL (ref 4.0–10.5)
nRBC: 0 % (ref 0.0–0.2)

## 2021-02-19 LAB — LIPID PANEL
Cholesterol: 145 mg/dL (ref 0–200)
HDL: 50 mg/dL (ref >40)
LDL Cholesterol: 78 mg/dL (ref 0–99)
Total CHOL/HDL Ratio: 2.9 RATIO
Triglycerides: 86 mg/dL (ref <150)
VLDL: 17 mg/dL (ref 0–40)

## 2021-02-19 SURGERY — COLONOSCOPY WITH PROPOFOL
Anesthesia: Monitor Anesthesia Care

## 2021-02-19 MED ORDER — ALTEPLASE 2 MG IJ SOLR: 2.0000 mg | Freq: Once | INTRAMUSCULAR | Status: DC | PRN

## 2021-02-19 MED ORDER — LIDOCAINE-PRILOCAINE 2.5-2.5 % EX CREA: 1.0000 "application " | TOPICAL_CREAM | CUTANEOUS | Status: DC | PRN

## 2021-02-19 MED ORDER — SODIUM CHLORIDE 0.9 % IV SOLN: 100.0000 mL | INTRAVENOUS | Status: DC | PRN

## 2021-02-19 MED ORDER — LIDOCAINE HCL (PF) 1 % IJ SOLN: 5.0000 mL | INTRAMUSCULAR | Status: DC | PRN

## 2021-02-19 MED ORDER — SODIUM CHLORIDE 0.9 % IV SOLN: INTRAVENOUS | Status: DC

## 2021-02-19 MED ORDER — PENTAFLUOROPROP-TETRAFLUOROETH EX AERO: 1.0000 "application " | INHALATION_SPRAY | CUTANEOUS | Status: DC | PRN

## 2021-02-19 MED ORDER — HEPARIN SODIUM (PORCINE) 1000 UNIT/ML DIALYSIS: 1000.0000 [IU] | INTRAMUSCULAR | Status: DC | PRN

## 2021-02-19 MED ORDER — PROPOFOL 10 MG/ML IV BOLUS
INTRAVENOUS | Status: DC | PRN
  Administered 2021-02-19: 10 mg via INTRAVENOUS
  Administered 2021-02-19: 20 mg via INTRAVENOUS

## 2021-02-19 MED ORDER — PROPOFOL 500 MG/50ML IV EMUL
INTRAVENOUS | Status: DC | PRN
  Administered 2021-02-19: 100 ug/kg/min via INTRAVENOUS

## 2021-02-19 MED ORDER — METOCLOPRAMIDE HCL 5 MG/ML IJ SOLN
5.0000 mg | Freq: Once | INTRAMUSCULAR | Status: AC
  Administered 2021-02-19: 5 mg via INTRAVENOUS
  Filled 2021-02-19: qty 2

## 2021-02-19 MED ORDER — SEVELAMER CARBONATE 0.8 G PO PACK
0.8000 g | PACK | Freq: Three times a day (TID) | ORAL | Status: DC
  Filled 2021-02-19 (×4): qty 1

## 2021-02-19 SURGICAL SUPPLY — 22 items

## 2021-02-19 NOTE — Op Note (Addendum)
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 Post-operatively she had                            delayed stoma healing. Admitted with blood in the                            ostomy bag. Providers:                [name] MD, MD, Jeanella Cara, RN,                            [facility] Technician, Technician Referring MD:              Medicines:                Monitored Anesthesia Care Complications:            No immediate complications. Estimated Blood Loss:     Estimated blood loss: none. Estimated blood loss                            was minimal. Procedure:                Pre-Anesthesia Assessment:                           - Prior to the procedure, a History and Physical                            was performed, and patient medications and                            allergies were reviewed. The patient's tolerance of                            previous anesthesia was also reviewed. The risks                            and benefits of the procedure and the sedation                            options and risks were discussed with the patient.  All questions were answered, and informed consent                            was obtained. Prior Anticoagulants: The patient has                            taken Eliquis (apixaban), last dose was 4 days                            prior to procedure. ASA Grade Assessment: III - A                            patient with severe systemic disease. After                             reviewing the risks and benefits, the patient was                            deemed in satisfactory condition to undergo the                            procedure.                           After I obtained informed consent, the scope was                            passed under direct vision. Throughout the                            procedure, the patient's blood pressure, pulse, and                            oxygen saturations were monitored continuously. The                            [id number]-HQ190L ([id number]) Olympus colonoscope was                            introduced through the ileostomy and advanced to                            the 30 cm into the ileum. The ileoscopy was                            performed without difficulty. The patient tolerated                            the procedure well. The quality of the bowel                            preparation was adequate. The ileoscopy was  performed without difficulty. The patient tolerated                            the procedure well. The quality of the bowel                            preparation was excellent. The terminal ileum was                            photographed. After I obtained informed consent,                            the scope was passed under direct vision.                            Throughout the procedure, the patient's blood                            pressure, pulse, and oxygen saturations were                            monitored continuously. Scope In: 9:41:51 AM Scope Out: 9:49:27 AM Total Procedure Duration: 0 hours 7 minutes 36 seconds  Findings:      The area at 4 cm proximal to the stoma contained a single medium-sized       angioectasia with bleeding on contact. Coagulation for hemostasis using       argon plasma was successful.      The remainder of the examined small bowel appeared normal. No other       bleeding site identified. No blood present. The exam was  otherwise       without abnormality. 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 Treated with                            argon plasma coagulation (APC).                           - The examination was otherwise normal. No other                            bleeding sites identified. Recommendation:           - Continue with colon cancer surveillance at                            frequent intervals given history if it remains                            clinically appropriate.                           - Continue serial hgb/hct with transfusion as  indicated.                           - Advance diet.                           - No additional inpatient GI evaluation planned at                            this time.                           - Follow-up with Dr. Drue Flirt at [facility] as needed.                           Results and recommendations were discussed with the                            patient's daughter by phone.                           The inpatient GI team will move to stand-by. Please                            call the on-call gastroenterologist with any                            additional questions or concerns during this                            hospitalization. Procedure Code(s):        --- Professional ---                           [phone], Ileoscopy, through stoma; diagnostic,                            including collection of specimen(s) by brushing or                            washing, when performed (separate procedure)                           44799, Unlisted procedure, small intestine Diagnosis Code(s):        --- Professional ---                           K55.20, Angiodysplasia of colon without hemorrhage                           K94.11, Enterostomy hemorrhage CPT copyright 2019 American Medical Association. All rights reserved. The codes documented in this report are preliminary and upon coder review may  be revised to  meet current compliance requirements. [name] MD, MD 02/19/2021 10:12:09 AM This report has been signed electronically. Number of Addenda: 0

## 2021-02-19 NOTE — Transfer of Care (Signed)
Immediate Anesthesia Transfer of Care Note  Patient: [name]  Procedure(s) Performed: COLONOSCOPY WITH PROPOFOL HOT HEMOSTASIS (ARGON PLASMA COAGULATION/BICAP)  Patient Location: PACU  Anesthesia Type:MAC  Level of Consciousness: drowsy and patient cooperative  Airway & Oxygen Therapy: Patient connected to face mask oxygen  Post-op Assessment: Report given to RN, Post -op Vital signs reviewed and stable and Patient moving all extremities  Post vital signs: Reviewed and stable  Last Vitals:  Vitals Value Taken Time  BP 133/55 02/19/21 1000  Temp    Pulse 56 02/19/21 1002  Resp 21 02/19/21 1002  SpO2 100 % 02/19/21 1002  Vitals shown include unvalidated device data.  Last Pain:  Vitals:   02/19/21 0837  TempSrc: Temporal  PainSc: 0-No pain      Patients Stated Pain Goal: 0 (13/88/71 9597)  Complications: No notable events documented.

## 2021-02-19 NOTE — Progress Notes (Signed)
Progress note indicates pt for possible d/c tomorrow am if stable. Contacted [place] and spoke to [place]. Pt is still on the schedule for tomorrow and pt can receive treatment at clinic if pt arrives by 11:30 (pt's normal time). Spoke to pt's dtr at bedside regarding pt's possible d/c in the am if stable and pt's need for HD tomorrow. Discussed pt being d/c in the am and then proceeding to [facility] for out-pt HD treatment. Dtr agreeable to plan and confirms family can transport. Contacted attending, nephrology, and RN CM to provide update and to request that pt be seen early tomorrow am to see if pt can d/c. Also contacted inpt HD unit and spoke to charge RN to request that pt be placed on 2nd shift in case pt unable to d/c in the am. Update provided to pt's RN as well. Will follow and assist.   Melven Sartorius Renal Navigator [phone]

## 2021-02-19 NOTE — Anesthesia Procedure Notes (Signed)
Procedure Name: MAC Date/Time: 02/19/2021 9:24 AM Performed by: Amadeo Garnet, CRNA Pre-anesthesia Checklist: Patient identified, Emergency Drugs available, Suction available and Patient being monitored Patient Re-evaluated:Patient Re-evaluated prior to induction Oxygen Delivery Method: Simple face mask Preoxygenation: Pre-oxygenation with 100% oxygen Induction Type: IV induction Placement Confirmation: positive ETCO2 Dental Injury: Teeth and Oropharynx as per pre-operative assessment

## 2021-02-19 NOTE — Progress Notes (Addendum)
Family Medicine Teaching Service Daily Progress Note Intern Pager: [phone]  Patient name: [name] Medical record number: [id number] Date of birth: 08/22/1940 Age: 81 y.o. Gender: female  Primary Care Provider: Wannetta Sender, FNP Consultants: Nephrology, GI Code Status: Full  Pt Overview and Major Events to Date:  1/21 - admitted, transfused 2u pRBCs for anemia 1/23 - chest pain with bowel prep, initial workup negative  Assessment and Plan: [name] is a 81 y.o. female who presented with generalized weakness and dyspnea, found to have symptomatic anemia. She is now s/p 2U PRBC. Additional PMHx is significant for ESRD on HD TTS, HFpEF, paroxysmal atrial flutter, CAD, HLD, hypothyroidism, and lynch syndrome s/p right hemicolectomy.    Symptomatic Anemia Patient's Hgb this morning is 8.8, down from 9.5 yesterday. Colonoscopy this morning at 9:30 AM after cardiac clearance from her episode of chest pain during bowel prep demonstrated an AVM that was bleeding on contact. It was coagulated without difficulty, and no other bleeding was noted. Suspect patient's bleeding from ostomy and decreasing Hgb secondary to this AVM. GI recommended serial Hgb follow ups with transfusions as needed and signed off. Given treatment of source of bleeding, patient is stable for discharge tomorrow likely after one more CBC demonstrating stable Hgb. -D/c tomorrow if Hgb stable and patient's clinical status continues to improve   Chest pain Patient's chest pain after her bowel prep has resolved. Cardiac workup and consult did not reveal any cardiac cause for pain. Suspect pain 2/2 to cramps or GI upset from bowel regimen used; miralax for subsequent bowel preparation did not produce the same results. -Continue to monitor telemetry, clinical status   ESRD on HD TTS  S/p right nephrectomy for renal cell carcinoma 2017 Patient received HD yesterday, and her Cr is 5.75, up from 8.22 yesterday.  She did require clonazepam 1 mg before her session yesterday secondary to anxiety. Her next session is tomorrow, 1/26. -Nephrology following, appreciate recommendations -Continue to monitor   Paroxysmal Atrial Fibrillation Patient's HR ranged 44-91. Patient is in NAD. -Continue amiodarone -Restart eliquis at d/c   CAP   Asthma Patient is breathing comfortably with RR 15-20 and SpO2 95-100% on [place]. She is breathing and speaking comfortably on exam. -Continue albuterol prn for SOB, wheezing -Continue pulse ox   Type 2 DM  Patient's CBG have ranged 104-168. She did not receive novolog overnight. -Continue novolog ssi as needed   Hx HTN   Hypotension: chronic, stable  Patient's BP is stable at 92-142/31-65. She received midodrine 10 mg with HD. Patient's lowest SBP and DBP occurred overnight while sleeping, and she reports no complaints, so will continue to monitor for now. -Continue to monitor   GERD   Hypothyroidism   Anxiety   S/p total hysterectomy   HFpEF Patient continues to be stable. -Continue synthroid, famotidine, clonazepam prn, and estradiol -Continue to monitor   FEN/GI: Renal/carb modified diet PPx: None given persistent anemia Dispo:Home pending stable Hgb in the morning  Subjective:  Patient resting comfortably in the bed after her colonoscopy. Her daughter mentions that she was nauseous after the procedure which is common after she gets anesthesia. Otherwise, they are relieved there was no cancer found on the study.  Objective: Temp:  [97 F (36.1 C)-98.1 F (36.7 C)] 98.1 F (36.7 C) (01/25 0418) Pulse Rate:  [44-91] 59 (01/25 0418) Resp:  [17-20] 17 (01/25 0418) BP: (92-148)/(31-72) 108/52 (01/25 0418) SpO2:  [96 %-100 %] 100 % (01/25 0418) Weight:  [  92 kg-94.5 kg] 92 kg (01/24 1725)  Physical Exam: General: Lying in bed resting comfortably Cardiovascular: Normal rate, difficulty appreciating heart sounds Respiratory: CTAB Abdomen:  Nondistended  Laboratory: Recent Labs  Lab 02/17/21 0252 02/18/21 0031 02/19/21 0254  WBC 9.6 8.6 9.3  HGB 8.3* 9.5* 8.8*  HCT 29.1* 32.4* 30.1*  PLT 204 219 168   Recent Labs  Lab 02/15/21 1307 02/16/21 0129 02/17/21 0252 02/18/21 0031 02/19/21 0254  NA 135   < > 135 136 134*  K 3.8   < > 4.1 4.7 4.1  CL 99   < > 103 102 98  CO2 21*   < > 21* 19* 23  BUN 32*   < > 27* 31* 19  CREATININE 9.17*   < > 6.64* 8.22* 5.75*  CALCIUM 9.0   < > 8.6* 9.1 8.2*  PROT 6.4*  --   --   --   --   BILITOT 0.5  --   --   --   --   ALKPHOS 89  --   --   --   --   ALT 19  --   --   --   --   AST 28  --   --   --   --   GLUCOSE 148*   < > 133* 151* 115*   < > = values in this interval not displayed.   Imaging/Diagnostic Tests: Colonoscopy/Endoscopy: The area at 4 cm proximal to the stoma contained a single medium-sized angioectasia with bleeding on contact. Coagulation for hemostasis using argon plasma was successful. The remainder of the examined small bowel appeared normal. No other bleeding site identified. No blood present. The exam was otherwise without abnormality.  Signed: Mikal Plane, Medical Student 02/19/2021, 7:51 AM [place] Medical Student Pager: [phone]  I was personally present and performed or re-performed the history, physical exam and medical decision making activities of this service and have verified that the service and findings are accurately documented in the students note.  Shary Key, DO                  02/19/2021, 4:43 PM

## 2021-02-19 NOTE — Progress Notes (Signed)
Pt off the unit to hemo 

## 2021-02-19 NOTE — Interval H&P Note (Signed)
History and Physical Interval Note:  02/19/2021 9:06 AM  [name]  has presented today for surgery, with the diagnosis of Hematochezia, history of Lynch.  The various methods of treatment have been discussed with the patient and family. After consideration of risks, benefits and other options for treatment, the patient has consented to  Procedure(s): COLONOSCOPY WITH PROPOFOL (N/A) as a surgical intervention.  The patient's history has been reviewed, patient examined, no change in status, stable for surgery.  I have reviewed the patient's chart and labs.  Questions were answered to the patient's satisfaction.     [name]

## 2021-02-19 NOTE — Progress Notes (Signed)
[facility] Progress Note   Subjective:  Seen in room. Had colonoscopy this am - single AVM treated. Eating lunch. No cp, dyspnea   Objective Vitals:   02/19/21 0837 02/19/21 1000 02/19/21 1015 02/19/21 1054  BP: (!) 117/47  (!) 146/66 108/71  Pulse: (!) 58  (!) 53 (!) 53  Resp: 14  18 18   Temp: (!) 97.4 F (36.3 C) (!) 97.5 F (36.4 C)  (!) 97.3 F (36.3 C)  TempSrc: Temporal   Oral  SpO2: 100%  100% 100%  Weight: 92 kg     Height: 5\' 2"  (1.575 m)       Additional Objective Labs: Basic Metabolic Panel: Recent Labs  Lab 02/17/21 0252 02/18/21 0031 02/19/21 0254  NA 135 136 134*  K 4.1 4.7 4.1  CL 103 102 98  CO2 21* 19* 23  GLUCOSE 133* 151* 115*  BUN 27* 31* 19  CREATININE 6.64* 8.22* 5.75*  CALCIUM 8.6* 9.1 8.2*  PHOS 7.9* 8.8* 6.5*    CBC: Recent Labs  Lab 02/15/21 1307 02/16/21 0129 02/17/21 0252 02/18/21 0031 02/19/21 0254  WBC 8.6 7.7 9.6 8.6 9.3  HGB 6.8* 9.5* 8.3* 9.5* 8.8*  HCT 25.1* 31.3* 29.1* 32.4* 30.1*  MCV 79.9* 78.8* 80.6 80.2 81.1  PLT 251 172 204 219 168    Blood Culture    Component Value Date/Time   SDES BLOOD RIGHT ARM 06/23/2020 1305   SPECREQUEST  06/23/2020 1305    BOTTLES DRAWN AEROBIC ONLY Blood Culture results may not be optimal due to an inadequate volume of blood received in culture bottles   CULT  06/23/2020 1305    NO GROWTH 5 DAYS Performed at [facility], [place] [address], [place], [place] [address]    REPTSTATUS 06/28/2020 FINAL 06/23/2020 1305     Physical Exam General: Alert, lying in bed, nad  Heart: RRR No, r, g  Lungs: Clear, bilaterally  Abdomen: soft non-tender Extremities: No sig LE edema  Dialysis Access: L IJ TDC   Medications:   amiodarone  200 mg Oral Daily   calcitRIOL  1 mcg Oral Q T,Th,Sa-HD   Chlorhexidine Gluconate Cloth  6 each Topical Q0600   estradiol  1 mg Oral Daily   famotidine  20 mg Oral Daily   insulin aspart  0-6 Units Subcutaneous TID WC    levothyroxine  175 mcg Oral Daily   metoCLOPramide (REGLAN) injection  5 mg Intravenous Once   midodrine  10 mg Oral Q T,Th,Sa-HD   vitamin B-12  500 mcg Oral Daily    Dialysis Orders:  TTS [facility]   3.5h  2/2 bath  93.6kg  400/1.5  Heparin none  L IJ TDC (failed LUE access, failed L thigh AVG)  - mircera 225 q2, last 1/19  - calc 1 ug tiw   Assessment/Plan: Anemia, symptomatic - sp 2u prbcs on 1/21.  Hgb 8-9s stable. Plans per GI. S/p Colonoscopy 1/25 -single AVM treated with APC.  ESRD - usual HD TTS.  Next HD 1/26.  BP/ volume - low bp's chronically, on midodrine pre HD tts. No vol ^ on exam. CXR clear.  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   MBD ckd - cont vdra. Ca in range. Phos not to goal - restart Renvela powder .  Parox atrial fib - per pmd. On amio , eliquis.  DM - on insulin SP colostomy - w/ colon cancer surgery in 2022  Lynnda Child PA-C [facility]  02/19/2021,12:43 PM

## 2021-02-19 NOTE — Progress Notes (Signed)
Echocardiogram 2D Echocardiogram has been performed.  [name] 02/19/2021, 3:34 PM

## 2021-02-19 NOTE — Anesthesia Preprocedure Evaluation (Addendum)
Anesthesia Evaluation  Patient identified by MRN, date of birth, ID band Patient awake    Reviewed: Allergy & Precautions, NPO status , Patient's Chart, lab work & pertinent test results  History of Anesthesia Complications (+) PONV, PROLONGED EMERGENCE and history of anesthetic complications  Airway Mallampati: III  TM Distance: >3 FB Neck ROM: Full    Dental  (+) Dental Advisory Given, Missing   Pulmonary asthma , sleep apnea ,   PRN Home O2    Pulmonary exam normal        Cardiovascular hypertension, + CAD, + Past MI, +CHF and + DOE  Normal cardiovascular exam+ dysrhythmias Atrial Fibrillation and Supra Ventricular Tachycardia    '22 TEE - EF 50 to 55%. Left atrial size was mild to moderately dilated. A small pericardial effusion is present. The pericardial effusion is circumferential. There is no evidence of cardiac tamponade. Trivial mitral valve  regurgitation. Mild aortic valve sclerosis is present,  with no evidence of aortic valve stenosis. There is mild (Grade II) layered plaque involving the transverse aorta  and descending aorta. Agitated saline contrast bubble study was positive with shunting observed within 3-6 cardiac cycles suggestive of interatrial shunt. There  is a small patent foramen ovale with predominantly right to left shunting across the atrial septum.   See cardiology consult    Neuro/Psych PSYCHIATRIC DISORDERS Anxiety Depression Dementia  Neuromuscular disease    GI/Hepatic Neg liver ROS, hiatal hernia, GERD  Controlled,  Endo/Other  diabetes, Insulin DependentHypothyroidism  Obesity   Renal/GU Dialysis and ESRFRenal disease     Musculoskeletal  (+) Arthritis , Osteoarthritis,   Gout    Abdominal   Peds  Hematology  (+) anemia ,  On eliquis    Anesthesia Other Findings   Reproductive/Obstetrics                            Anesthesia Physical Anesthesia  Plan  ASA: 4  Anesthesia Plan: MAC   Post-op Pain Management:    Induction:   PONV Risk Score and Plan: 3 and Propofol infusion and Treatment may vary due to age or medical condition  Airway Management Planned: Nasal Cannula and Natural Airway  Additional Equipment: None  Intra-op Plan:   Post-operative Plan:   Informed Consent: I have reviewed the patients History and Physical, chart, labs and discussed the procedure including the risks, benefits and alternatives for the proposed anesthesia with the patient or authorized representative who has indicated his/her understanding and acceptance.       Plan Discussed with: CRNA and Anesthesiologist  Anesthesia Plan Comments:        Anesthesia Quick Evaluation

## 2021-02-19 NOTE — Op Note (Addendum)
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 She still had suspected                            disease in her descending colon but was not removed                            in surgery due to her high risk and instability in                            the OR. Post-operatively she had delayed stoma                            healing. Admitted with blood in the ostomy bag. Providers:                [name] MD, MD, Jeanella Cara, RN,                            [facility] Technician, Technician Referring MD:              Medicines:                Monitored Anesthesia Care Complications:            No immediate complications. Estimated Blood Loss:     Estimated blood loss: none. Procedure:                Pre-Anesthesia Assessment:                           - Prior to the procedure, a History and Physical                            was performed, and patient medications and                            allergies were reviewed. The patient's tolerance of  previous anesthesia was also reviewed. The risks                            and benefits of the procedure and the sedation                            options and risks were discussed with the patient.                            All questions were answered, and informed consent                            was obtained. Prior Anticoagulants: The patient has                            taken  Eliquis (apixaban), last dose was 4 days                            prior to procedure. ASA Grade Assessment: III - A                            patient with severe systemic disease. After                            reviewing the risks and benefits, the patient was                            deemed in satisfactory condition to undergo the                            procedure.                           After obtaining informed consent, the colonoscope                            was passed under direct vision. Throughout the                            procedure, the patient's blood pressure, pulse, and                            oxygen saturations were monitored continuously. The                            [id number]-HQ190L ([id number]) Olympus colonoscope was                            introduced through the anus with the intention of                            advancing to the anastomosis. The scope was  advanced to the descending colon - 45 cm from the                            anal verge - before the procedure was aborted. I                            was unable to safely advance the colonoscope                            further given the concern for potential                            perforation. Medications were given. The                            colonoscopy was performed with moderate difficulty                            due to post-surgical anatomy. The patient tolerated                            the procedure well. The quality of the bowel                            preparation was excellent. Scope In: 9:31:20 AM Scope Out: 9:36:00 AM Total Procedure Duration: 0 hours 4 minutes 40 seconds  Findings:      Non-bleeding external and internal hemorrhoids were found.      A few small-mouthed diverticula were found in the sigmoid colon.      The exam was otherwise without abnormality on direct and retroflexion       views. However, I was unable to evaluate the  colon proximal to the       distal descending colon due to adhesions. Impression:               - Non-bleeding external and internal hemorrhoids.                           - Diverticulosis in the sigmoid colon.                           - Her colon was not completely evaluated due to the                            fixed nature of the colon due to likely adhesions.                           - The examination was otherwise normal on direct                            and retroflexion views.                           - No specimens collected. Recommendation:           -  Proceed with ileoscopy now for further evaluation. Procedure Code(s):        --- Professional ---                           J0315, 53, Colorectal cancer screening; colonoscopy                            on individual at high risk Diagnosis Code(s):        --- Professional ---                           K57.30, Diverticulosis of large intestine without                            perforation or abscess without bleeding                           K64.8, Other hemorrhoids                           Z85.038, Personal history of other malignant                            neoplasm of large intestine CPT copyright 2019 American Medical Association. All rights reserved. The codes documented in this report are preliminary and upon coder review may  be revised to meet current compliance requirements. [name] MD, MD 02/19/2021 10:02:53 AM This report has been signed electronically. Number of Addenda: 0

## 2021-02-19 NOTE — Anesthesia Postprocedure Evaluation (Signed)
Anesthesia Post Note  Patient: [name]  Procedure(s) Performed: COLONOSCOPY WITH PROPOFOL HOT HEMOSTASIS (ARGON PLASMA COAGULATION/BICAP)     Patient location during evaluation: PACU Anesthesia Type: MAC Level of consciousness: awake and alert Pain management: pain level controlled Vital Signs Assessment: post-procedure vital signs reviewed and stable Respiratory status: spontaneous breathing, nonlabored ventilation and respiratory function stable Cardiovascular status: stable and blood pressure returned to baseline Anesthetic complications: no   No notable events documented.  Last Vitals:  Vitals:   02/19/21 1015 02/19/21 1054  BP: (!) 146/66 108/71  Pulse: (!) 53 (!) 53  Resp: 18 18  Temp:  (!) 36.3 C  SpO2: 100% 100%                   Audry Pili

## 2021-02-19 NOTE — Progress Notes (Signed)
Made it through GI w/u without complications. NSR on ECG / telemetry. Plan:  [facility] will sign off.   Medication Recommendations:  Continue scheduled CV meds Other recommendations (labs, testing, etc):  none Follow up as an outpatient:  as required.

## 2021-02-19 NOTE — Plan of Care (Signed)

## 2021-02-20 ENCOUNTER — Other Ambulatory Visit (HOSPITAL_COMMUNITY): Payer: Self-pay

## 2021-02-20 ENCOUNTER — Other Ambulatory Visit: Payer: Self-pay | Admitting: Student

## 2021-02-20 ENCOUNTER — Encounter (HOSPITAL_COMMUNITY): Payer: Self-pay | Admitting: Gastroenterology

## 2021-02-20 LAB — RENAL FUNCTION PANEL
Albumin: 2.7 g/dL — ABNORMAL LOW (ref 3.5–5.0)
Anion gap: 11 (ref 5–15)
BUN: 28 mg/dL — ABNORMAL HIGH (ref 8–23)
CO2: 23 mmol/L (ref 22–32)
Calcium: 9 mg/dL (ref 8.9–10.3)
Chloride: 102 mmol/L (ref 98–111)
Creatinine, Ser: 7.18 mg/dL — ABNORMAL HIGH (ref 0.44–1.00)
GFR, Estimated: 5 mL/min — ABNORMAL LOW (ref >60)
Glucose, Bld: 127 mg/dL — ABNORMAL HIGH (ref 70–99)
Phosphorus: 8.1 mg/dL — ABNORMAL HIGH (ref 2.5–4.6)
Potassium: 4.4 mmol/L (ref 3.5–5.1)
Sodium: 136 mmol/L (ref 135–145)

## 2021-02-20 LAB — CBC
HCT: 32.7 % — ABNORMAL LOW (ref 36.0–46.0)
Hemoglobin: 9.5 g/dL — ABNORMAL LOW (ref 12.0–15.0)
MCH: 23.6 pg — ABNORMAL LOW (ref 26.0–34.0)
MCHC: 29.1 g/dL — ABNORMAL LOW (ref 30.0–36.0)
MCV: 81.3 fL (ref 80.0–100.0)
Platelets: 195 10*3/uL (ref 150–400)
RBC: 4.02 MIL/uL (ref 3.87–5.11)
RDW: 20.3 % — ABNORMAL HIGH (ref 11.5–15.5)
WBC: 9.9 10*3/uL (ref 4.0–10.5)
nRBC: 0 % (ref 0.0–0.2)

## 2021-02-20 LAB — GLUCOSE, CAPILLARY: Glucose-Capillary: 98 mg/dL (ref 70–99)

## 2021-02-20 MED ORDER — CALCITRIOL 0.5 MCG PO CAPS
1.0000 ug | ORAL_CAPSULE | ORAL | 0 refills | Status: DC
  Filled 2021-02-20: qty 30, 35d supply, fill #0

## 2021-02-20 NOTE — Progress Notes (Signed)
[facility] Progress Note   Subjective:  Seen in room. No complaints. For discharge today.   Objective Vitals:   02/19/21 1054 02/19/21 1637 02/19/21 2148 02/20/21 0404  BP: 108/71 (!) 97/48 (!) 114/58 (!) 112/59  Pulse: (!) 53 60 (!) 58 60  Resp: 18 18 17 19   Temp: (!) 97.3 F (36.3 C) 98.1 F (36.7 C) 98.4 F (36.9 C) 98.1 F (36.7 C)  TempSrc: Oral  Oral Oral  SpO2: 100% 96% 100% 95%  Weight:      Height:        Additional Objective Labs: Basic Metabolic Panel: Recent Labs  Lab 02/18/21 0031 02/19/21 0254 02/20/21 0317  NA 136 134* 136  K 4.7 4.1 4.4  CL 102 98 102  CO2 19* 23 23  GLUCOSE 151* 115* 127*  BUN 31* 19 28*  CREATININE 8.22* 5.75* 7.18*  CALCIUM 9.1 8.2* 9.0  PHOS 8.8* 6.5* 8.1*    CBC: Recent Labs  Lab 02/16/21 0129 02/17/21 0252 02/18/21 0031 02/19/21 0254 02/20/21 0317  WBC 7.7 9.6 8.6 9.3 9.9  HGB 9.5* 8.3* 9.5* 8.8* 9.5*  HCT 31.3* 29.1* 32.4* 30.1* 32.7*  MCV 78.8* 80.6 80.2 81.1 81.3  PLT 172 204 219 168 195    Blood Culture    Component Value Date/Time   SDES BLOOD RIGHT ARM 06/23/2020 1305   SPECREQUEST  06/23/2020 1305    BOTTLES DRAWN AEROBIC ONLY Blood Culture results may not be optimal due to an inadequate volume of blood received in culture bottles   CULT  06/23/2020 1305    NO GROWTH 5 DAYS Performed at [facility], [place] [address], [place], [place] [address]    REPTSTATUS 06/28/2020 FINAL 06/23/2020 1305     Physical Exam General: Alert, lying in bed, nad  Heart: RRR No, r, g  Lungs: Clear, bilaterally  Abdomen: soft non-tender Extremities: No sig LE edema  Dialysis Access: L IJ TDC   Medications:  sodium chloride     sodium chloride     sodium chloride     sodium chloride      amiodarone  200 mg Oral Daily   calcitRIOL  1 mcg Oral Q T,Th,Sa-HD   Chlorhexidine Gluconate Cloth  6 each Topical Q0600   estradiol  1 mg Oral Daily   famotidine  20 mg Oral Daily   insulin aspart   0-6 Units Subcutaneous TID WC   levothyroxine  175 mcg Oral Daily   midodrine  10 mg Oral Q T,Th,Sa-HD   sevelamer carbonate  0.8 g Oral TID WC   vitamin B-12  500 mcg Oral Daily    Dialysis Orders:  TTS [facility]   3.5h  2/2 bath  93.6kg  400/1.5  Heparin none  L IJ TDC (failed LUE access, failed L thigh AVG)  - mircera 225 q2, last 1/19  - calc 1 ug tiw   Assessment/Plan: Anemia, symptomatic - sp 2u prbcs on 1/21.  Hgb 8-9s stable. Plans per GI. S/p Colonoscopy 1/25 -single AVM treated with APC.  ESRD - usual HD TTS.  Next HD 1/26 at outpatient center.  BP/ volume - BP chronically low on midodrine pre HD tts. Stable here. No vol ^ on exam. CXR clear.  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   MBD ckd - cont vdra. Ca in range. Phos not to goal - restart Renvela powder .  Parox atrial fib - per pmd. On amio. Restart Eliquis at discharge  DM -  on insulin SP colostomy - w/ colon cancer surgery in 2022  [facility] 02/20/2021,9:31 AM

## 2021-02-20 NOTE — Progress Notes (Addendum)
FPTS Brief Progress Note  S: Went to see patient for night rounds.  She was laying in bed asleep with daughter at bedside sleeping as well.  Rounded with patient's nurse who stated patient initially complained about nausea and she gave as needed Phenergan.  Patient had no other complaint afterwards.   O: BP (!) 114/58    Pulse (!) 58    Temp 98.4 F (36.9 C) (Oral)    Resp 17    Ht 5\' 2"  (1.575 m)    Wt 92 kg    SpO2 100%    BMI 37.10 kg/m   General:Asleep, NAD Pulm: Nonlabored breathing on 3 L [place]   A/P: -Continue plan per day team - Orders reviewed. Labs for AM ordered, which was adjusted as needed.   [name], MD 02/20/2021, 2:31 AM PGY-1, [facility] Night Resident  Please page [phone] with questions.

## 2021-02-20 NOTE — TOC Transition Note (Addendum)
Transition of Care [facility]) - CM/SW Discharge Note   Patient Details  Name: [name] MRN: [id number] Date of Birth: May 17, 1940  [facility] CM/SW Contact:  Tom-Johnson, Renea Ee, RN Phone Number: 02/20/2021, 1:03 PM   Clinical Narrative:     Patient is scheduled for discharge today. No followup by PT/OT. No other recommendations noted. Rollator recommended by PT but patient had a wheelchair within the last 5 yrs and Medicare will not pay. Daughter, [name] to followup with [employer] about concerns. Daughter to transport at discharge. No further TOC needs noted.    Final next level of care: Home/Self Care Barriers to Discharge: Barriers Resolved   Patient Goals and CMS Choice Patient states their goals for this hospitalization and ongoing recovery are:: To return home CMS Medicare.gov Compare Post Acute Care list provided to:: Patient Choice offered to / list presented to : Patient, Adult Children (Daughter, [name])  Discharge Placement                       Discharge Plan and Services   Discharge Planning Services: CM Consult Post Acute Care Choice: NA          DME Arranged: N/A DME Agency: NA       HH Arranged: NA HH Agency: NA        Social Determinants of Health (SDOH) Interventions     Readmission Risk Interventions No flowsheet data found.

## 2021-02-20 NOTE — Discharge Summary (Addendum)
[facility] Discharge Summary  Patient name: [name] Medical record number: [id number] Date of birth: 1940-09-08 Age: 81 y.o. Gender: female Date of Admission: 02/15/2021  Date of Discharge: 02/20/2021 Admitting Physician: Kinnie Feil, MD  Primary Care Provider: Wannetta Sender, FNP Consultants: Nephrology, GI  Indication for Hospitalization: Symptomatic anemia  Discharge Diagnoses/Problem List:  Hospital Problem List as of 02/20/2021          Priority Resolved POA     Unprioritized     * (Principal) Blood loss anemia   Unknown     End stage renal disease ([place])   Unknown     Anemia   Yes     Pressure injury of skin   Unknown     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[place])   Unknown     Malignant neoplasm of descending colon ([place])   Unknown     History of colon cancer   Unknown     Lynch syndrome   Unknown     AVM (arteriovenous malformation) of small bowel, acquired with hemorrhage   Unknown    Disposition: Home  Discharge Condition: Stable  Discharge Exam:  GEN: Well developed, well-nourished, in NAD HEAD: NCAT CVS: Regular rate, irregular rhythm, normal S1/S2, no murmurs, rubs, gallops RESP: Breathing comfortably on RA, intermittent wheezes heard throughout lung fields ABD: Nondistended EXT: Moves all extremities grossly equally    Brief Hospital Course:  Symptomatic Anemia Patient presented with fatigue, low BP, and blood per ostomy bag. Her Hgb was 6.8; after 2u pRBCs Hgb was 9.5. 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 It was coagulated without difficulty, and no other bleeding was noted. Suspect patient's anemia secondary to AVM. GI recommended serial Hgb checks and transfusions as needed. At discharge, patient's condition had improved and Hgb was 9.5.  Chest pain Patient experienced chest pain after using [id number] bowel prep for GI scope. She  had experienced this before with previous colonoscopies. Cardiac workup and consult did not reveal any cardiac cause for pain. Suspect pain due to cramps or GI upset from bowel regimen used; miralax for bowel prep did not produce the same results. At discharge, patient did not have chest pain, and her vitals were stable.   ESRD on HD TTS  S/p right nephrectomy for renal cell carcinoma 2017 Patient received HD in hospital with moderate improvements in Cr each time. Nephrology followed her throughout admission. At discharge, patient's Cr was 7.18, and she was headed straight to outpatient HD.   Paroxysmal Atrial Fibrillation Patient remained rate controlled over admission on amiodarone. Her eliquis was held before her GI procedure, but it was added back at discharge.   CAP   Asthma Patient completed 2 week course of azithromycin for suspected CAP during admission. She was given albuterol prn for SOB and wheezing. On discharge, she was breathing comfortably on RA and indicated home nebulizer regimen for chronic symptoms.   Type 2 DM  Patient's CBGs remained in low-mid 100s throughout admission. She did not receive any lantus over admission. She was continued on her home regimen at discharge.   Hx HTN   Hypotension: chronic, stable  Patient's BP remained soft but stable over admission. She received one dose of midodrine before HD inpatient.   GERD   Hypothyroidism   Anxiety   S/p total hysterectomy   HFpEF Patient was continued on synthroid, famotidine, clonazepam prn, and estradiol over admission  without incident.  Issues for Follow Up:  Serial Hgb checks Any evidence of bleeding from ostomy Continue outpatient dialysis  Significant Procedures: Colonoscopy, endoscopy  Significant Labs and Imaging:  Recent Labs  Lab 02/18/21 0031 02/19/21 0254 02/20/21 0317  WBC 8.6 9.3 9.9  HGB 9.5* 8.8* 9.5*  HCT 32.4* 30.1* 32.7*  PLT 219 168 195   Recent Labs  Lab 02/15/21 1307 02/16/21 0129  02/17/21 0252 02/18/21 0031 02/19/21 0254 02/20/21 0317  NA 135 134* 135 136 134* 136  K 3.8 3.9 4.1 4.7 4.1 4.4  CL 99 98 103 102 98 102  CO2 21* 23 21* 19* 23 23  GLUCOSE 148* 164* 133* 151* 115* 127*  BUN 32* 12 27* 31* 19 28*  CREATININE 9.17* 4.52* 6.64* 8.22* 5.75* 7.18*  CALCIUM 9.0 8.3* 8.6* 9.1 8.2* 9.0  MG  --  1.8  --   --   --   --   PHOS  --  4.3 7.9* 8.8* 6.5* 8.1*  ALKPHOS 89  --   --   --   --   --   AST 28  --   --   --   --   --   ALT 19  --   --   --   --   --   ALBUMIN 2.9*  --  2.5* 2.7* 2.5* 2.7*   Results/Tests Pending at Time of Discharge: None  Discharge Medications:  Allergies as of 02/20/2021       Reactions   Adhesive [tape] Itching, Swelling, Rash, Other (See Comments)   Tears skin and causes blisters also. EKG pads will cause welps.    Avelox [moxifloxacin] Swelling, Rash   Blueberry Flavor Anaphylaxis   Cefprozil Shortness Of Breath, Rash   Tolerated ceftriaxone on 06/20/20   Cetacaine [butamben-tetracaine-benzocaine] Nausea And Vomiting, Swelling   Dicyclomine Nausea And Vomiting, Other (See Comments)   "Heart trouble"; Headaches and increased blood sugars "Heart trouble"; Headaches and increased blood sugars   Food Anaphylaxis, Other (See Comments)   Melons, Bananas, [place]aloupes, Watermelon-throat closes up and blisters    Imdur [isosorbide Nitrate] Hives, Palpitations, Other (See Comments), Rash   Headaches also   Januvia [sitagliptin] Shortness Of Breath   Lipitor [atorvastatin] Shortness Of Breath   Losartan Potassium Shortness Of Breath   Nitroglycerin Other (See Comments)   Caused cardiac arrest and feels like skin bring torn off back of head   Omeprazole Shortness Of Breath, Swelling   Oxycodone Hives, Rash   Tolerates Dilaudid Tolerates Dilaudid   Penicillins Anaphylaxis   Has patient had a PCN reaction causing immediate rash, facial/tongue/throat swelling, SOB or lightheadedness with hypotension: Yes Has patient had a PCN  reaction causing severe rash involving mucus membranes or skin necrosis: No Has patient had a PCN reaction that required hospitalization Yes Has patient had a PCN reaction occurring within the last 10 years: No If all of the above answers are "NO", then may proceed with Cephalosporin use. Has patient had a PCN reaction causing immediate rash, facial/tongue/throat swelling, SOB or lightheadedness with hypotension: Yes Has patient had a PCN reaction causing severe rash involving mucus membranes or skin necrosis: No Has patient had a PCN reaction that required hospitalization Yes Has patient had a PCN reaction occurring within the last 10 years: No If all of the above answers are "NO", then may proceed with Cephalosporin use.   Prednisone Anaphylaxis   Vancomycin Anaphylaxis   Hydrocodone Hives   Tolerates Dilaudid   Latex  Other (See Comments), Rash   blisters   Tamiflu [oseltamivir] Other (See Comments)   Contraindicated with other medications Patient on tikosyn, and tamiflu interfered with anti arrhythmic med Contraindicated with other medications Patient on tikosyn, and tamiflu interfered with anti arrhythmic med   Feraheme [ferumoxytol] Other (See Comments)   Sharp pain to lower back and flank   Other Other (See Comments)   Hydrogen - unknown   Lasix [furosemide] Hives, Swelling, Rash   Mupirocin Rash        Medication List     STOP taking these medications    azithromycin 250 MG tablet Commonly known as: ZITHROMAX       TAKE these medications    acetaminophen 500 MG tablet Commonly known as: TYLENOL Take 0.5 tablets (250 mg total) by mouth every 6 (six) hours as needed for mild pain (pain).   amiodarone 200 MG tablet Commonly known as: PACERONE TAKE 1 TABLET ONE TIME DAILY What changed: See the new instructions. Notes to patient: 02/20/2021   benzonatate 200 MG capsule Commonly known as: TESSALON Take 200 mg by mouth daily as needed for cough.   calcitRIOL  0.5 MCG capsule Commonly known as: ROCALTROL Take 2 capsules (1 mcg total) by mouth Every Tuesday,Thursday,and Saturday with dialysis. Notes to patient: 02/20/2021   cetirizine 10 MG tablet Commonly known as: ZYRTEC Take 10 mg by mouth daily.   clonazePAM 1 MG tablet Commonly known as: KLONOPIN Take 1 mg by mouth 3 (three) times daily as needed for anxiety.   Eliquis 5 MG Tabs tablet Generic drug: apixaban TAKE ONE TABLET BY MOUTH TWICE DAILY What changed: how much to take   estradiol 1 MG tablet Commonly known as: ESTRACE Take 1 mg by mouth daily.   famotidine 20 MG tablet Commonly known as: PEPCID Take 20 mg by mouth daily.   fluticasone 50 MCG/ACT nasal spray Commonly known as: FLONASE Place 1 spray into both nostrils daily.   insulin glargine 100 UNIT/ML Solostar Pen Commonly known as: LANTUS Inject 0-35 Units into the skin daily. Sliding scale.   lidocaine-prilocaine cream Commonly known as: EMLA Apply 1 application topically as needed.   midodrine 10 MG tablet Commonly known as: PROAMATINE Take 10 mg by mouth See admin instructions. Takes 10 mg on Tuesdays, Thursdays and Saturdays. Takes 10 mg extra if blood pressure drops in between dialysis. What changed: Another medication with the same name was removed. Continue taking this medication, and follow the directions you see here.   multivitamin Tabs tablet Take 1 tablet by mouth daily.   ondansetron 4 MG tablet Commonly known as: Zofran Take 1 tablet (4 mg total) by mouth every 8 (eight) hours as needed for nausea or vomiting.   promethazine 25 MG tablet Commonly known as: PHENERGAN Take 25 mg by mouth every 6 (six) hours as needed for vomiting or nausea.   Synthroid 175 MCG tablet Generic drug: levothyroxine Take 175 mcg by mouth daily.   True Metrix Blood Glucose Test test strip Generic drug: glucose blood   TRUEplus Lancets 33G Misc   vitamin B-12 500 MCG tablet Commonly known as:  CYANOCOBALAMIN Take 500 mcg by mouth daily.       Discharge Instructions: Please refer to Patient Instructions section of EMR for full details.  Patient was counseled important signs and symptoms that should prompt return to medical care, changes in medications, dietary instructions, activity restrictions, and follow up appointments.   Follow-Up Appointments: with PCP.  Signed: Mikal Plane, Medical  Student 02/20/2021, 1:54 PM [place] Medical Student Pager: [phone]   I was personally present and re-performed the exam and MDM and verified the service and findings are accurately documented in the student's note. [place], DO 02/20/21 2:48 PM

## 2021-02-20 NOTE — Progress Notes (Signed)
Pt is for d/c this am in order to get to out-pt HD clinic by 11:30. Spoke to [place] at [facility] to make clinic aware that pt will receive treatment at clinic today. Contacted renal PA regarding orders needed by clinic. Pt's RN aware of plan.   Melven Sartorius Renal Navigator [phone]

## 2021-02-20 NOTE — Discharge Instructions (Addendum)
Thank you for letting us take care of you during your stay! Here are your discharge instructions:  Your anemia has improved after finding the abnormal blood vessel in your bowel and stopping the bleeding. We did not find a cardiac reason for your chest pain after your bowel prep, meaning it could have been cramping from the medications that caused it. Continue taking your medications as prescribed on this paper. Follow up with your PCP shortly after your hospital stay to ensure your blood counts are still improving and to assess your breathing.  Please let us know if you have any other questions before you follow up with your primary care provider.  Ethelene Hal, [place]

## 2021-02-20 NOTE — Progress Notes (Signed)
DISCHARGE NOTE HOME [name] to be discharged Home per MD order. Discussed prescriptions and follow up appointments with the patient. Prescriptions given to patient; medication list explained in detail. Patient verbalized understanding.  Skin clean, dry and intact without evidence of skin break down, no evidence of skin tears noted. IV catheter discontinued intact. Site without signs and symptoms of complications. Dressing and pressure applied. Pt denies pain at the site currently. No complaints noted.  Patient free of lines, drains, and wounds.   An After Visit Summary (AVS) was printed and given to the patient. Patient escorted via wheelchair, and discharged home via private auto.  Toshi Ishii S Eulia Hatcher, RN

## 2021-02-20 NOTE — TOC Benefit Eligibility Note (Signed)
Patient Advocate Encounter   Received notification fthat prior authorization for Calcitriol 0.5 mcg is required.   PA submitted on 02/20/2021 Key [id number] Status is pending       Lyndel Safe, [place] Patient Advocate Specialist [place] Patient Advocate Team Direct Number: [phone]  Fax: [phone]

## 2021-02-21 ENCOUNTER — Other Ambulatory Visit (HOSPITAL_COMMUNITY): Payer: Self-pay

## 2021-02-21 ENCOUNTER — Telehealth: Payer: Self-pay | Admitting: Nurse Practitioner

## 2021-02-21 NOTE — Telephone Encounter (Signed)
Transition of care contact from inpatient facility  Date of discharge: 02/20/2021 Date of contact: 02/21/2021 Method: Phone Spoke to: Daughter Heinz Knuckles  Patient contacted to discuss transition of care from recent inpatient hospitalization. Patient was admitted to [facility] from 01/21-01/26/2023 with discharge diagnosis of symptomatic anemia.  Medication changes were reviewed. Daughter upset because she believes HD center does not communicate changes in patient's condition in timely fashion. Have entered order into [place] that daughter be notified of all changes. Also wishes to have copy of labs. Have entered orders in [employer]ube.  Patient will follow up with his/her outpatient HD unit on: 01/

## 2021-02-25 ENCOUNTER — Ambulatory Visit (HOSPITAL_COMMUNITY): Payer: Medicare HMO | Admitting: Nurse Practitioner

## 2021-02-26 ENCOUNTER — Inpatient Hospital Stay (HOSPITAL_COMMUNITY)
Admission: EM | Admit: 2021-02-26 | Discharge: 2021-03-01 | DRG: 640 | Disposition: A | Discharge status: Home Health | Payer: Medicare HMO | Attending: Family Medicine | Admitting: Family Medicine

## 2021-02-26 ENCOUNTER — Emergency Department (HOSPITAL_COMMUNITY): Payer: Medicare HMO

## 2021-02-26 ENCOUNTER — Encounter (HOSPITAL_COMMUNITY): Payer: Self-pay | Admitting: Emergency Medicine

## 2021-02-26 ENCOUNTER — Other Ambulatory Visit: Payer: Self-pay

## 2021-02-26 ENCOUNTER — Encounter: Payer: Self-pay | Admitting: Student

## 2021-02-26 DIAGNOSIS — E872 Acidosis, unspecified: Secondary | ICD-10-CM | POA: Diagnosis not present

## 2021-02-26 DIAGNOSIS — R109 Unspecified abdominal pain: Secondary | ICD-10-CM

## 2021-02-26 DIAGNOSIS — Z800 Family history of malignant neoplasm of digestive organs: Secondary | ICD-10-CM

## 2021-02-26 DIAGNOSIS — Z8619 Personal history of other infectious and parasitic diseases: Secondary | ICD-10-CM

## 2021-02-26 DIAGNOSIS — Z20822 Contact with and (suspected) exposure to covid-19: Secondary | ICD-10-CM | POA: Diagnosis present

## 2021-02-26 DIAGNOSIS — N186 End stage renal disease: Secondary | ICD-10-CM | POA: Diagnosis present

## 2021-02-26 DIAGNOSIS — Z79890 Hormone replacement therapy (postmenopausal): Secondary | ICD-10-CM

## 2021-02-26 DIAGNOSIS — E1140 Type 2 diabetes mellitus with diabetic neuropathy, unspecified: Secondary | ICD-10-CM | POA: Diagnosis present

## 2021-02-26 DIAGNOSIS — Z9049 Acquired absence of other specified parts of digestive tract: Secondary | ICD-10-CM

## 2021-02-26 DIAGNOSIS — I5042 Chronic combined systolic (congestive) and diastolic (congestive) heart failure: Secondary | ICD-10-CM | POA: Diagnosis present

## 2021-02-26 DIAGNOSIS — Z1509 Genetic susceptibility to other malignant neoplasm: Secondary | ICD-10-CM

## 2021-02-26 DIAGNOSIS — Z91018 Allergy to other foods: Secondary | ICD-10-CM

## 2021-02-26 DIAGNOSIS — Z888 Allergy status to other drugs, medicaments and biological substances status: Secondary | ICD-10-CM

## 2021-02-26 DIAGNOSIS — E669 Obesity, unspecified: Secondary | ICD-10-CM | POA: Diagnosis present

## 2021-02-26 DIAGNOSIS — R079 Chest pain, unspecified: Secondary | ICD-10-CM

## 2021-02-26 DIAGNOSIS — Z87440 Personal history of urinary (tract) infections: Secondary | ICD-10-CM

## 2021-02-26 DIAGNOSIS — D631 Anemia in chronic kidney disease: Secondary | ICD-10-CM | POA: Diagnosis present

## 2021-02-26 DIAGNOSIS — Z6837 Body mass index (BMI) 37.0-37.9, adult: Secondary | ICD-10-CM

## 2021-02-26 DIAGNOSIS — Z881 Allergy status to other antibiotic agents status: Secondary | ICD-10-CM

## 2021-02-26 DIAGNOSIS — I2510 Atherosclerotic heart disease of native coronary artery without angina pectoris: Secondary | ICD-10-CM | POA: Diagnosis present

## 2021-02-26 DIAGNOSIS — Z8249 Family history of ischemic heart disease and other diseases of the circulatory system: Secondary | ICD-10-CM

## 2021-02-26 DIAGNOSIS — Z87442 Personal history of urinary calculi: Secondary | ICD-10-CM

## 2021-02-26 DIAGNOSIS — Z7901 Long term (current) use of anticoagulants: Secondary | ICD-10-CM

## 2021-02-26 DIAGNOSIS — Z885 Allergy status to narcotic agent status: Secondary | ICD-10-CM

## 2021-02-26 DIAGNOSIS — J45909 Unspecified asthma, uncomplicated: Secondary | ICD-10-CM | POA: Diagnosis present

## 2021-02-26 DIAGNOSIS — I252 Old myocardial infarction: Secondary | ICD-10-CM

## 2021-02-26 DIAGNOSIS — M1990 Unspecified osteoarthritis, unspecified site: Secondary | ICD-10-CM | POA: Diagnosis present

## 2021-02-26 DIAGNOSIS — K219 Gastro-esophageal reflux disease without esophagitis: Secondary | ICD-10-CM | POA: Diagnosis present

## 2021-02-26 DIAGNOSIS — Z833 Family history of diabetes mellitus: Secondary | ICD-10-CM

## 2021-02-26 DIAGNOSIS — R9431 Abnormal electrocardiogram [ECG] [EKG]: Secondary | ICD-10-CM | POA: Diagnosis present

## 2021-02-26 DIAGNOSIS — R112 Nausea with vomiting, unspecified: Secondary | ICD-10-CM | POA: Diagnosis present

## 2021-02-26 DIAGNOSIS — E889 Metabolic disorder, unspecified: Secondary | ICD-10-CM | POA: Diagnosis present

## 2021-02-26 DIAGNOSIS — Z794 Long term (current) use of insulin: Secondary | ICD-10-CM

## 2021-02-26 DIAGNOSIS — F411 Generalized anxiety disorder: Secondary | ICD-10-CM | POA: Diagnosis present

## 2021-02-26 DIAGNOSIS — E1122 Type 2 diabetes mellitus with diabetic chronic kidney disease: Secondary | ICD-10-CM | POA: Diagnosis present

## 2021-02-26 DIAGNOSIS — J9610 Chronic respiratory failure, unspecified whether with hypoxia or hypercapnia: Secondary | ICD-10-CM | POA: Diagnosis present

## 2021-02-26 DIAGNOSIS — I4819 Other persistent atrial fibrillation: Secondary | ICD-10-CM | POA: Diagnosis present

## 2021-02-26 DIAGNOSIS — Z85038 Personal history of other malignant neoplasm of large intestine: Secondary | ICD-10-CM

## 2021-02-26 DIAGNOSIS — Z79899 Other long term (current) drug therapy: Secondary | ICD-10-CM

## 2021-02-26 DIAGNOSIS — N2581 Secondary hyperparathyroidism of renal origin: Secondary | ICD-10-CM | POA: Diagnosis present

## 2021-02-26 DIAGNOSIS — Z86010 Personal history of colonic polyps: Secondary | ICD-10-CM

## 2021-02-26 DIAGNOSIS — Z90710 Acquired absence of both cervix and uterus: Secondary | ICD-10-CM

## 2021-02-26 DIAGNOSIS — Z933 Colostomy status: Secondary | ICD-10-CM

## 2021-02-26 DIAGNOSIS — E785 Hyperlipidemia, unspecified: Secondary | ICD-10-CM | POA: Diagnosis present

## 2021-02-26 DIAGNOSIS — Z880 Allergy status to penicillin: Secondary | ICD-10-CM

## 2021-02-26 DIAGNOSIS — E876 Hypokalemia: Secondary | ICD-10-CM | POA: Diagnosis present

## 2021-02-26 DIAGNOSIS — Z992 Dependence on renal dialysis: Secondary | ICD-10-CM

## 2021-02-26 DIAGNOSIS — I132 Hypertensive heart and chronic kidney disease with heart failure and with stage 5 chronic kidney disease, or end stage renal disease: Secondary | ICD-10-CM | POA: Diagnosis present

## 2021-02-26 DIAGNOSIS — E039 Hypothyroidism, unspecified: Secondary | ICD-10-CM | POA: Diagnosis present

## 2021-02-26 LAB — CBC WITH DIFFERENTIAL/PLATELET
Abs Immature Granulocytes: 0.05 10*3/uL (ref 0.00–0.07)
Basophils Absolute: 0.1 10*3/uL (ref 0.0–0.1)
Basophils Relative: 1 %
Eosinophils Absolute: 0 10*3/uL (ref 0.0–0.5)
Eosinophils Relative: 0 %
HCT: 34.6 % — ABNORMAL LOW (ref 36.0–46.0)
Hemoglobin: 9.4 g/dL — ABNORMAL LOW (ref 12.0–15.0)
Immature Granulocytes: 1 %
Lymphocytes Relative: 10 %
Lymphs Abs: 0.7 10*3/uL (ref 0.7–4.0)
MCH: 22.5 pg — ABNORMAL LOW (ref 26.0–34.0)
MCHC: 27.2 g/dL — ABNORMAL LOW (ref 30.0–36.0)
MCV: 82.8 fL (ref 80.0–100.0)
Monocytes Absolute: 0.6 10*3/uL (ref 0.1–1.0)
Monocytes Relative: 8 %
Neutro Abs: 5.9 10*3/uL (ref 1.7–7.7)
Neutrophils Relative %: 80 %
Platelets: 224 10*3/uL (ref 150–400)
RBC: 4.18 MIL/uL (ref 3.87–5.11)
RDW: 19.6 % — ABNORMAL HIGH (ref 11.5–15.5)
WBC: 7.3 10*3/uL (ref 4.0–10.5)
nRBC: 0 % (ref 0.0–0.2)

## 2021-02-26 LAB — COMPREHENSIVE METABOLIC PANEL
ALT: 21 U/L (ref 0–44)
AST: 47 U/L — ABNORMAL HIGH (ref 15–41)
Albumin: 3.2 g/dL — ABNORMAL LOW (ref 3.5–5.0)
Alkaline Phosphatase: 88 U/L (ref 38–126)
Anion gap: 17 — ABNORMAL HIGH (ref 5–15)
BUN: 17 mg/dL (ref 8–23)
CO2: 22 mmol/L (ref 22–32)
Calcium: 9 mg/dL (ref 8.9–10.3)
Chloride: 95 mmol/L — ABNORMAL LOW (ref 98–111)
Creatinine, Ser: 6.63 mg/dL — ABNORMAL HIGH (ref 0.44–1.00)
GFR, Estimated: 6 mL/min — ABNORMAL LOW (ref >60)
Glucose, Bld: 89 mg/dL (ref 70–99)
Potassium: 3.1 mmol/L — ABNORMAL LOW (ref 3.5–5.1)
Sodium: 134 mmol/L — ABNORMAL LOW (ref 135–145)
Total Bilirubin: 0.6 mg/dL (ref 0.3–1.2)
Total Protein: 6.6 g/dL (ref 6.5–8.1)

## 2021-02-26 LAB — LIPASE, BLOOD: Lipase: 37 U/L (ref 11–51)

## 2021-02-26 LAB — LACTIC ACID, PLASMA: Lactic Acid, Venous: 4.3 mmol/L (ref 0.5–1.9)

## 2021-02-26 LAB — TROPONIN I (HIGH SENSITIVITY): Troponin I (High Sensitivity): 22 ng/L — ABNORMAL HIGH (ref <18)

## 2021-02-26 LAB — MAGNESIUM: Magnesium: 1.9 mg/dL (ref 1.7–2.4)

## 2021-02-26 LAB — BRAIN NATRIURETIC PEPTIDE: B Natriuretic Peptide: 176.8 pg/mL — ABNORMAL HIGH (ref 0.0–100.0)

## 2021-02-26 MED ORDER — FENTANYL CITRATE PF 50 MCG/ML IJ SOSY
50.0000 ug | PREFILLED_SYRINGE | Freq: Once | INTRAMUSCULAR | Status: AC
  Administered 2021-02-26: 50 ug via INTRAVENOUS
  Filled 2021-02-26: qty 1

## 2021-02-26 MED ORDER — LORAZEPAM 1 MG PO TABS
1.0000 mg | ORAL_TABLET | Freq: Once | ORAL | Status: AC
Start: 2021-02-26 — End: 2021-02-26
  Administered 2021-02-26: 1 mg via SUBLINGUAL
  Filled 2021-02-26: qty 1

## 2021-02-26 MED ORDER — LACTATED RINGERS IV BOLUS
500.0000 mL | Freq: Once | INTRAVENOUS | Status: AC
  Administered 2021-02-26: 500 mL via INTRAVENOUS

## 2021-02-26 NOTE — ED Notes (Signed)
Pt has been transferred to CT unable to assess v/s at present

## 2021-02-26 NOTE — ED Triage Notes (Signed)
Patient coming from home, complaint of n/v, abdominal pain since last night after getting back from dialysis.

## 2021-02-26 NOTE — ED Provider Triage Note (Signed)
Emergency Medicine Provider Triage Evaluation Note  [name] , a 82 y.o. female  was evaluated in triage.  Pt complains of abdominal pain starting yesterday with associated vomiting.  Patient has a history of colectomy and ostomy.  She is ESRD on dialysis, last dialyzed yesterday.  Recent hospitalization for anemia.  No blood noted in ostomy bag.  She has had persistent vomiting today.  She states that she gets flares of pain like this after she eats something sugary.  Review of Systems  Positive: Abdominal pain, vomiting Negative: Fever  Physical Exam  BP (!) 133/57 (BP Location: Right Arm)    Pulse (!) 57    Temp 97.6 F (36.4 C) (Oral)    Resp 18    SpO2 100%  Gen:   Awake, appears uncomfortable  Resp:  Normal effort  MSK:   Moves extremities without difficulty  Other:    Medical Decision Making  Medically screening exam initiated at 7:27 PM.  Appropriate orders placed.  [name] was informed that the remainder of the evaluation will be completed by another provider, this initial triage assessment does not replace that evaluation, and the importance of remaining in the ED until their evaluation is complete.     Carlisle Cater, PA-C 02/26/21 1928

## 2021-02-26 NOTE — ED Provider Notes (Signed)
[facility] EMERGENCY DEPARTMENT Provider Note   CSN: [id number] Arrival date & time: 02/26/21  1900     History  Chief Complaint  Patient presents with   Abdominal Pain   Nausea   Emesis    [name] is a 81 y.o. female.   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  The patient states that she has developed substernal chest pressure, described as an elephant sitting on her chest.  She endorses persistent significant nausea with episodes of nonbloody but yellow and green-colored emesis.  She denies any radiation of the pain to her back.  She states that she has had normal output from her colostomy.  Home Medications Prior to Admission medications   Medication Sig Start Date End Date Taking? Authorizing Provider  acetaminophen (TYLENOL) 500 MG tablet Take 0.5 tablets (250 mg total) by mouth every 6 (six) hours as needed for mild pain (pain). Patient taking differently: Take 1,000 mg by mouth every 6 (six) hours as needed for mild pain (pain). 11/22/16  Yes Aline August, MD  amiodarone (PACERONE) 200 MG tablet TAKE 1 TABLET ONE TIME DAILY Patient taking differently: Take 200 mg by mouth daily. 08/01/20  Yes [name], Peter M, MD  apixaban (ELIQUIS) 5 MG TABS tablet TAKE ONE TABLET BY MOUTH TWICE DAILY Patient taking differently: Take 5 mg by mouth 2 (two) times daily. 10/18/20  Yes [name], Peter M, MD  benzonatate (TESSALON) 200 MG capsule Take 200 mg by mouth  daily as needed for cough.   Yes [provider]  calcitRIOL (ROCALTROL) 0.5 MCG capsule Take 2 capsules (1 mcg total) by mouth Every Tuesday,Thursday,and Saturday with dialysis. 02/20/21  Yes Pray, Norwood Levo, MD  cetirizine (ZYRTEC) 10 MG tablet Take 10 mg by mouth daily.    Yes [provider]  clonazePAM (KLONOPIN) 1 MG tablet Take 1 mg by mouth 3 (three) times daily as needed for anxiety.   Yes [provider]  estradiol (ESTRACE) 1 MG tablet Take 1 mg by mouth daily.   Yes [provider]  famotidine (PEPCID) 20 MG tablet Take 20 mg by mouth daily.   Yes [provider]  fluticasone (FLONASE) 50 MCG/ACT nasal spray Place 1 spray into both nostrils daily. 10/25/19  Yes [provider]  Insulin Glargine (LANTUS) 100 UNIT/ML Solostar Pen Inject 0-35 Units into the skin daily. Sliding scale. 08/05/13  Yes Delfina Redwood, MD  levalbuterol Penne Lash) 1.25 MG/3ML nebulizer solution Inhale 1.25 mg into the lungs 3 (three) times daily. 02/17/21  Yes [provider]  midodrine (PROAMATINE) 10 MG tablet Take 10 mg by mouth See admin instructions. Takes 10 mg on Tuesdays, Thursdays and Saturdays. Takes 10 mg extra if blood pressure drops in between dialysis.   Yes [provider]  multivitamin (RENA-VIT) TABS tablet Take 1 tablet by mouth at bedtime.   Yes [provider]  promethazine (PHENERGAN) 25 MG tablet Take 25 mg by mouth every 6 (six) hours as needed for vomiting or nausea. 09/11/19  Yes [provider]  SYNTHROID 175  MCG tablet Take 175 mcg by mouth daily. 08/14/19  Yes [provider]  TRUE METRIX BLOOD GLUCOSE TEST test strip  06/29/19  Yes [provider]  TRUEplus Lancets 33G [place]  06/29/19  Yes [provider]  vitamin B-12 (CYANOCOBALAMIN) 500 MCG tablet Take 500 mcg by mouth daily.   Yes [provider]  lidocaine-prilocaine (EMLA) cream Apply 1 application topically as  needed. Patient not taking: Reported on 02/15/2021 11/01/19   Barbie Banner, PA-C  ondansetron (ZOFRAN) 4 MG tablet Take 1 tablet (4 mg total) by mouth every 8 (eight) hours as needed for nausea or vomiting. Patient not taking: Reported on 02/15/2021 04/28/19   Rosetta Posner, MD      Allergies    Adhesive [tape], Avelox [moxifloxacin], Blueberry flavor, Cefprozil, Cetacaine [butamben-tetracaine-benzocaine], Dicyclomine, Food, Imdur [isosorbide nitrate], Januvia [sitagliptin], Lipitor [atorvastatin], Losartan potassium, Nitroglycerin, Omeprazole, Oxycodone, Penicillins, Prednisone, Vancomycin, Hydrocodone, Latex, Tamiflu [oseltamivir], Feraheme [ferumoxytol], Other, Lasix [furosemide], and Mupirocin    Review of Systems   Review of Systems  Cardiovascular:  Positive for chest pain.  Gastrointestinal:  Positive for abdominal pain, nausea and vomiting.  All other systems reviewed and are negative.  Physical Exam Updated Vital Signs BP 126/72    Pulse (!) 56    Temp 97.6 F (36.4 C) (Oral)    Resp 18    SpO2 99%  Physical Exam Vitals and nursing note reviewed.  Constitutional:      General: She is not in acute distress. HENT:     Head: Normocephalic and atraumatic.  Eyes:     Conjunctiva/sclera: Conjunctivae normal.     Pupils: Pupils are equal, round, and reactive to light.  Cardiovascular:     Rate and Rhythm: Normal rate and regular rhythm.     Comments: 2+ symmetric radial pulses bilaterally Pulmonary:     Effort: Pulmonary effort is normal. No respiratory distress.     Breath sounds: Normal breath sounds.  Abdominal:     General: There is no distension.     Tenderness: There is no abdominal tenderness. There is no guarding.     Comments: Colostomy in place draining brown liquid, no evidence of bright red blood  Musculoskeletal:        General: No deformity or signs of injury.     Cervical back: Neck supple.     Right lower leg: No edema.     Left lower leg: No edema.   Skin:    Findings: No lesion or rash.  Neurological:     General: No focal deficit present.     Mental Status: She is alert. Mental status is at baseline.    ED Results / Procedures / Treatments   Labs (all labs ordered are listed, but only abnormal results are displayed) Labs Reviewed  COMPREHENSIVE METABOLIC PANEL - Abnormal; Notable for the following components:      Result Value   Sodium 134 (*)    Potassium 3.1 (*)    Chloride 95 (*)    Creatinine, Ser 6.63 (*)    Albumin 3.2 (*)    AST 47 (*)    GFR, Estimated 6 (*)    Anion gap 17 (*)    All other components within normal limits  CBC WITH DIFFERENTIAL/PLATELET - Abnormal; Notable for the following components:   Hemoglobin 9.4 (*)    HCT 34.6 (*)    MCH 22.5 (*)    MCHC 27.2 (*)    RDW 19.6 (*)  All other components within normal limits  LACTIC ACID, PLASMA - Abnormal; Notable for the following components:   Lactic Acid, Venous 4.3 (*)    All other components within normal limits  BRAIN NATRIURETIC PEPTIDE - Abnormal; Notable for the following components:   B Natriuretic Peptide 176.8 (*)    All other components within normal limits  LACTIC ACID, PLASMA - Abnormal; Notable for the following components:   Lactic Acid, Venous 3.4 (*)    All other components within normal limits  TROPONIN I (HIGH SENSITIVITY) - Abnormal; Notable for the following components:   Troponin I (High Sensitivity) 22 (*)    All other components within normal limits  TROPONIN I (HIGH SENSITIVITY) - Abnormal; Notable for the following components:   Troponin I (High Sensitivity) 21 (*)    All other components within normal limits  CULTURE, BLOOD (SINGLE)  LIPASE, BLOOD  MAGNESIUM    EKG EKG Interpretation  Date/Time:  Wednesday February 26 2021 21:16:27 EST Ventricular Rate:  59 PR Interval:    QRS Duration: 108 QT Interval:  506 QTC Calculation: 502 R Axis:   14 Text Interpretation: Sinus rhythm Low voltage, precordial leads  Nonspecific T abnrm, anterolateral leads Prolonged QT interval Reconfirmed by Regan Lemming ([id number]) on 02/27/2021 12:42:11 AM  Radiology CT CHEST WO CONTRAST  Result Date: 02/27/2021 CLINICAL DATA:  Chest pain EXAM: CT CHEST WITHOUT CONTRAST TECHNIQUE: Multidetector CT imaging of the chest was performed following the standard protocol without IV contrast. RADIATION DOSE REDUCTION: This exam was performed according to the departmental dose-optimization program which includes automated exposure control, adjustment of the mA and/or kV according to patient size and/or use of iterative reconstruction technique. COMPARISON:  Chest x-ray from the previous day. FINDINGS: Cardiovascular: Initial precontrast images were obtained although functioning IV could not be utilized for subsequent CTA of the chest. Precontrast images demonstrate atherosclerotic calcifications of the thoracic aorta without aneurysmal dilatation. No hyperdense crescent to suggest acute aortic injury is noted. Calcification in the aortic valve is noted. Coronary calcifications are seen. No cardiomegaly is noted. Left jugular dialysis catheter is seen. Mediastinum/Nodes: Thoracic inlet is within normal limits. No sizable hilar or mediastinal adenopathy is noted. The esophagus as visualized is within normal limits. Lungs/Pleura: Lungs are well aerated bilaterally. No focal infiltrate or sizable effusion is noted. Minimal platelike scarring is again noted in the lingula similar to that seen on prior chest x-ray. No sizable pneumothorax or parenchymal nodule is seen. Upper Abdomen: Visualized upper abdomen shows no acute abnormality. Musculoskeletal: Degenerative changes of the thoracic spine are noted. IMPRESSION: Limited noncontrast imaging demonstrates atherosclerotic calcifications without aneurysmal dilatation. No hyperdense crescent to suggest acute aortic abnormality is noted. Aortic Atherosclerosis (ICD10-I70.0). Electronically Signed   By: Inez Catalina M.D.   On: 02/27/2021 00:27   DG Chest Portable 1 View  Result Date: 02/26/2021 CLINICAL DATA:  Chest pain. EXAM: PORTABLE CHEST 1 VIEW COMPARISON:  02/17/2021. FINDINGS: The heart is enlarged and the mediastinal contour is within normal limits. Atherosclerotic calcification of the aorta is noted. There is subsegmental atelectasis in the mid to lower lung fields bilaterally. No effusion or pneumothorax. A left internal jugular central venous catheter is stable. A loop recorder device is noted over the left chest. No acute osseous abnormality. IMPRESSION: 1. Subsegmental atelectasis bilaterally. 2. Cardiomegaly. Electronically Signed   By: Brett Fairy M.D.   On: 02/26/2021 21:37    Procedures Ultrasound ED Peripheral IV (Provider)  Date/Time: 02/26/2021 10:11 PM Performed by: Regan Lemming,  MD Authorized by: Regan Lemming, MD   Procedure details:    Indications: multiple failed IV attempts     Skin Prep: chlorhexidine gluconate     Location:  Right AC   Angiocath:  22 G   Bedside Ultrasound Guided: Yes     Images: not archived     Patient tolerated procedure without complications: Yes     Dressing applied: Yes      Medications Ordered in ED Medications  LORazepam (ATIVAN) tablet 1 mg (1 mg Sublingual Given 02/26/21 2154)  fentaNYL (SUBLIMAZE) injection 50 mcg (50 mcg Intravenous Given 02/26/21 2304)  lactated ringers bolus 500 mL (0 mLs Intravenous Paused 02/26/21 2235)    ED Course/ Medical Decision Making/ A&P Clinical Course as of 02/27/21 0118  Wed Feb 26, 2021  2132 Lactic Acid, Venous(!!): 4.3 [JL]    Clinical Course User Index [JL] Regan Lemming, MD                           Medical Decision Making Amount and/or Complexity of Data Reviewed Labs: ordered. Decision-making details documented in ED Course. Radiology: ordered.  Risk Prescription drug management.   81 year old female with a history of ESRD Tuesday Thursday Saturday via left chest wall permacath,  recent admission for symptomatic anemia with a hemoglobin of 6.8, found to have an AVM that was bleeding on colonoscopy/endoscopy that was coagulated without difficulty, paroxysmal atrial fibrillation on anticoagulation and rate controlled on amiodarone who presents with uncontrolled nausea and vomiting after hemodialysis yesterday.  The patient states that she has developed substernal chest pressure, described as an elephant sitting on her chest.  She endorses persistent significant nausea with episodes of nonbloody but yellow and green-colored emesis.  She denies any radiation of the pain to her back.  She states that she has had normal output from her colostomy.  On arrival, the patient was afebrile, he medically stable, saturating well on room air.  Atrial fibrillation without RVR was noted cardiac telemetry.  EKG was performed which revealed normal sinus rhythm with no T-segment changes noted, nonspecific T wave changes noted.  The patient presents with constant substernal chest pain with no radiation.  Differential diagnosis includes ACS, PE, aortic dissection, additionally considered Boerhaave's, pancreatitis, small bowel obstruction, other intra-abdominal catastrophe, dialysis disequilibrium syndrome, gastritis, dehydration.  IV access was difficult initially on arrival and ultrasound-guided IV access was achieved.  CTA chest abdomen pelvis was ordered to evaluate rule out aortic dissection or PE.  Initial troponin was found to be mildly elevated to 22 with a repeat downtrending to 21. Initial lactic acid was also found to be elevated to greater than 4.  The patient is afebrile, not tachycardic or tachypneic, with no leukocytosis noted on labs.  No concern for sepsis at this time.  More concern for possible dehydration following dialysis in the setting of ongoing nausea and vomiting.  CBC revealed a white blood cell count of 7.3, improved hemoglobin to 9.4 from previous hospitalization 2 weeks ago when  she had an AVM that was treated by cautery with gastroenterology.  Repeat lactic acid was downtrending following a 500 cc IV fluid bolus to 3.4.  The patient continued to endorse nausea.  She did have QT prolongation greater than 500 on EKG therefore was administered sublingual Ativan for nausea.  She was administered fentanyl for pain control.  CMP revealed hypokalemia to 3.1, no significant uremia, no significant biliary dysfunction with mild AST elevation of 47.  Plan to follow-up the patient's CTA imaging and reassess the patient following this. Signout given to Dr. Leonette Monarch at [phone].    Final Clinical Impression(s) / ED Diagnoses Final diagnoses:  Nausea and vomiting, unspecified vomiting type  Chest pain, unspecified type  Abdominal pain, unspecified abdominal location    Rx / DC Orders ED Discharge Orders     None         Regan Lemming, MD 02/27/21 [phone]

## 2021-02-27 ENCOUNTER — Emergency Department (HOSPITAL_COMMUNITY): Payer: Medicare HMO

## 2021-02-27 ENCOUNTER — Observation Stay (HOSPITAL_COMMUNITY): Payer: Medicare HMO

## 2021-02-27 DIAGNOSIS — R109 Unspecified abdominal pain: Secondary | ICD-10-CM | POA: Diagnosis not present

## 2021-02-27 DIAGNOSIS — R112 Nausea with vomiting, unspecified: Secondary | ICD-10-CM | POA: Diagnosis present

## 2021-02-27 DIAGNOSIS — R079 Chest pain, unspecified: Secondary | ICD-10-CM | POA: Diagnosis not present

## 2021-02-27 LAB — TROPONIN I (HIGH SENSITIVITY)
Troponin I (High Sensitivity): 21 ng/L — ABNORMAL HIGH (ref <18)
Troponin I (High Sensitivity): 20 ng/L — ABNORMAL HIGH (ref <18)
Troponin I (High Sensitivity): 25 ng/L — ABNORMAL HIGH (ref <18)

## 2021-02-27 LAB — LACTIC ACID, PLASMA
Lactic Acid, Venous: 2.4 mmol/L (ref 0.5–1.9)
Lactic Acid, Venous: 2.6 mmol/L (ref 0.5–1.9)
Lactic Acid, Venous: 3.4 mmol/L (ref 0.5–1.9)

## 2021-02-27 LAB — CBG MONITORING, ED
Glucose-Capillary: 173 mg/dL — ABNORMAL HIGH (ref 70–99)
Glucose-Capillary: 132 mg/dL — ABNORMAL HIGH (ref 70–99)

## 2021-02-27 LAB — GLUCOSE, CAPILLARY
Glucose-Capillary: 143 mg/dL — ABNORMAL HIGH (ref 70–99)
Glucose-Capillary: 129 mg/dL — ABNORMAL HIGH (ref 70–99)

## 2021-02-27 LAB — RESP PANEL BY RT-PCR (FLU A&B, COVID) ARPGX2
Influenza A by PCR: NEGATIVE
Influenza B by PCR: NEGATIVE
SARS Coronavirus 2 by RT PCR: NEGATIVE

## 2021-02-27 MED ORDER — MIDODRINE HCL 5 MG PO TABS
10.0000 mg | ORAL_TABLET | ORAL | Status: DC
  Administered 2021-03-01: 10 mg via ORAL
  Filled 2021-02-27 (×3): qty 2

## 2021-02-27 MED ORDER — LEVALBUTEROL HCL 1.25 MG/0.5ML IN NEBU
1.2500 mg | INHALATION_SOLUTION | Freq: Three times a day (TID) | RESPIRATORY_TRACT | Status: DC
  Administered 2021-02-28 – 2021-03-01 (×4): 1.25 mg via RESPIRATORY_TRACT
  Filled 2021-02-27 (×9): qty 0.5

## 2021-02-27 MED ORDER — INSULIN ASPART 100 UNIT/ML IJ SOLN
0.0000 [IU] | INTRAMUSCULAR | Status: DC
  Administered 2021-02-27: 1 [IU] via SUBCUTANEOUS

## 2021-02-27 MED ORDER — HYOSCYAMINE SULFATE 0.125 MG SL SUBL
0.1250 mg | SUBLINGUAL_TABLET | Freq: Once | SUBLINGUAL | Status: AC
Start: 2021-02-27 — End: 2021-02-27
  Administered 2021-02-27: 0.125 mg via ORAL
  Filled 2021-02-27: qty 1

## 2021-02-27 MED ORDER — CLONAZEPAM 0.5 MG PO TABS: 1.0000 mg | ORAL_TABLET | Freq: Three times a day (TID) | ORAL | Status: DC | PRN

## 2021-02-27 MED ORDER — CALCITRIOL 0.5 MCG PO CAPS
1.0000 ug | ORAL_CAPSULE | ORAL | Status: DC
  Administered 2021-03-01: 1 ug via ORAL
  Filled 2021-02-27 (×2): qty 2

## 2021-02-27 MED ORDER — PROCHLORPERAZINE EDISYLATE 10 MG/2ML IJ SOLN
10.0000 mg | Freq: Once | INTRAMUSCULAR | Status: AC
  Administered 2021-02-27: 10 mg via INTRAVENOUS
  Filled 2021-02-27: qty 2

## 2021-02-27 MED ORDER — IOHEXOL 350 MG/ML SOLN: 100.0000 mL | Freq: Once | INTRAVENOUS | Status: DC | PRN

## 2021-02-27 MED ORDER — ACETAMINOPHEN 325 MG PO TABS
650.0000 mg | ORAL_TABLET | Freq: Four times a day (QID) | ORAL | Status: DC | PRN
  Administered 2021-02-28 (×2): 650 mg via ORAL
  Filled 2021-02-27 (×2): qty 2

## 2021-02-27 MED ORDER — FAMOTIDINE 20 MG PO TABS
20.0000 mg | ORAL_TABLET | Freq: Every day | ORAL | Status: DC
  Administered 2021-02-27 – 2021-03-01 (×3): 20 mg via ORAL
  Filled 2021-02-27 (×3): qty 1

## 2021-02-27 MED ORDER — IOHEXOL 350 MG/ML SOLN
100.0000 mL | Freq: Once | INTRAVENOUS | Status: AC | PRN
  Administered 2021-02-27: 100 mL via INTRAVENOUS

## 2021-02-27 MED ORDER — LEVOTHYROXINE SODIUM 75 MCG PO TABS
175.0000 ug | ORAL_TABLET | Freq: Every day | ORAL | Status: DC
  Administered 2021-02-28: 175 ug via ORAL
  Filled 2021-02-27: qty 1

## 2021-02-27 MED ORDER — APIXABAN 5 MG PO TABS
5.0000 mg | ORAL_TABLET | Freq: Two times a day (BID) | ORAL | Status: DC
  Administered 2021-02-27 – 2021-03-01 (×4): 5 mg via ORAL
  Filled 2021-02-27 (×4): qty 1

## 2021-02-27 MED ORDER — LORAZEPAM 1 MG PO TABS
1.0000 mg | ORAL_TABLET | Freq: Once | ORAL | Status: AC
  Administered 2021-02-27: 1 mg via SUBLINGUAL
  Filled 2021-02-27: qty 1

## 2021-02-27 MED ORDER — AMIODARONE HCL 200 MG PO TABS
200.0000 mg | ORAL_TABLET | Freq: Every day | ORAL | Status: DC
  Administered 2021-02-27 – 2021-03-01 (×3): 200 mg via ORAL
  Filled 2021-02-27 (×3): qty 1

## 2021-02-27 MED ORDER — CHLORHEXIDINE GLUCONATE CLOTH 2 % EX PADS: 6.0000 | MEDICATED_PAD | Freq: Every day | CUTANEOUS | Status: DC

## 2021-02-27 MED ORDER — SODIUM CHLORIDE 0.9 % IV SOLN
12.5000 mg | Freq: Four times a day (QID) | INTRAVENOUS | Status: DC | PRN
  Administered 2021-02-27: 12.5 mg via INTRAVENOUS
  Filled 2021-02-27: qty 12.5

## 2021-02-27 MED ORDER — LIDOCAINE VISCOUS HCL 2 % MT SOLN
15.0000 mL | Freq: Once | OROMUCOSAL | Status: AC
Start: 2021-02-27 — End: 2021-02-27
  Administered 2021-02-27: 15 mL via OROMUCOSAL
  Filled 2021-02-27: qty 15

## 2021-02-27 MED ORDER — FENTANYL CITRATE PF 50 MCG/ML IJ SOSY
50.0000 ug | PREFILLED_SYRINGE | Freq: Once | INTRAMUSCULAR | Status: AC
  Administered 2021-02-27: 50 ug via INTRAVENOUS
  Filled 2021-02-27: qty 1

## 2021-02-27 MED ORDER — FLUTICASONE PROPIONATE 50 MCG/ACT NA SUSP
1.0000 | Freq: Every day | NASAL | Status: DC
  Administered 2021-02-28 – 2021-03-01 (×2): 1 via NASAL
  Filled 2021-02-27: qty 16

## 2021-02-27 NOTE — ED Notes (Signed)
Adjusted pt in bed ?

## 2021-02-27 NOTE — Discharge Instructions (Addendum)
Dear [name],   Thank you so much for allowing [place] to be part of your care!  You were admitted to [facility] for nausea and vomiting.  You were treated with medications.  You had CT scans of your abdomen and head which did not find any causes for your symptoms.   POST-HOSPITAL & CARE INSTRUCTIONS Make sure to eat smaller meals spread throughout the day. Do not take any anti-nausea medications without first consulting with your doctor. Please let PCP/Specialists know of any changes that were made.  Please see medications section of this packet for any medication changes.   DOCTOR'S APPOINTMENT & FOLLOW UP CARE INSTRUCTIONS  Future Appointments  Date Time Provider [place]  03/06/2021  3:45 PM [name], MD [facility]  03/24/2021  2:40 PM [name], Peter M, MD [facility] [place]: Please call your doctor or return to the ED if you develop chest pain, shortness of breath, or if your nausea and vomiting return and you are unable to hold down fluids.  Take care and be well!  [facility]  [place], [place] [address] [phone]

## 2021-02-27 NOTE — Progress Notes (Signed)
FPTS Interim Progress Note  Called and spoke to patient's daughter, Tomi Bamberger. Provided update on results thus far and ongoing workup. All questions answered.   Donney Dice, DO 02/27/2021, 2:03 PM PGY-2, [facility] pager [phone]

## 2021-02-27 NOTE — H&P (Signed)
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 CT CHEST WITHOUT CONTRAST TECHNIQUE: Multidetector CT imaging of the chest was performed following the standard protocol without IV contrast. RADIATION DOSE REDUCTION: This exam was performed according to the departmental dose-optimization program which includes automated exposure control, adjustment of the mA and/or kV according to patient size and/or use of iterative reconstruction technique. COMPARISON:  Chest x-ray from the previous day. FINDINGS: Cardiovascular: Initial precontrast images were obtained although functioning IV could not be utilized for subsequent CTA of the chest. Precontrast images demonstrate atherosclerotic calcifications of the thoracic aorta without aneurysmal dilatation. No hyperdense crescent to suggest acute aortic injury is noted. Calcification in the aortic valve is noted. Coronary  calcifications are seen. No cardiomegaly is noted. Left jugular dialysis catheter is seen. Mediastinum/Nodes: Thoracic inlet is within normal limits. No sizable hilar or mediastinal adenopathy is noted. The esophagus as visualized is within normal limits. Lungs/Pleura: Lungs are well aerated bilaterally. No focal infiltrate or sizable effusion is noted. Minimal platelike scarring is again noted in the lingula similar to that seen on prior chest x-ray. No sizable pneumothorax or parenchymal nodule is seen. Upper Abdomen: Visualized upper abdomen shows no acute abnormality. Musculoskeletal: Degenerative changes of the thoracic spine are noted. IMPRESSION: Limited noncontrast imaging demonstrates atherosclerotic calcifications without aneurysmal dilatation. No hyperdense crescent to suggest acute aortic abnormality is noted. Aortic Atherosclerosis (ICD10-I70.0). Electronically Signed   By: Inez Catalina M.D.   On: 02/27/2021 00:27   DG Chest Portable 1 View  Result Date: 02/26/2021 CLINICAL DATA:  Chest pain. EXAM: PORTABLE CHEST 1 VIEW COMPARISON:  02/17/2021. FINDINGS: The heart is enlarged and the mediastinal contour is within normal limits. Atherosclerotic calcification of the aorta is noted. There is subsegmental atelectasis in the mid to lower lung fields bilaterally. No effusion or pneumothorax. A left internal jugular central venous catheter is stable. A loop recorder device is noted over the left chest. No acute osseous abnormality. IMPRESSION: 1. Subsegmental atelectasis bilaterally. 2. Cardiomegaly. Electronically Signed   By: Brett Fairy M.D.   On: 02/26/2021 21:37   CT Angio Chest/Abd/Pel for Dissection W and/or Wo Contrast  Result Date: 02/27/2021 CLINICAL DATA:  81 year old female with chest pain. History of colon carcinoma. EXAM: CT ANGIOGRAPHY CHEST, ABDOMEN AND PELVIS TECHNIQUE: Multidetector CT imaging through the chest, abdomen and pelvis was performed using the standard protocol during bolus  administration of intravenous contrast. Multiplanar reconstructed images and MIPs were obtained and reviewed to  evaluate the vascular anatomy. RADIATION DOSE REDUCTION: This exam was performed according to the departmental dose-optimization program which includes automated exposure control, adjustment of the mA and/or kV according to patient size and/or use of iterative reconstruction technique. CONTRAST:  142mL OMNIPAQUE IOHEXOL 350 MG/ML SOLN COMPARISON:  Noncontrast chest CT 2346 hours. Cancer staging CT Chest, Abdomen, and Pelvis 03/20/2020. FINDINGS: CTA CHEST FINDINGS Cardiovascular: Negative for thoracic aortic dissection. Calcified aortic atherosclerosis. Tortuous proximal great vessels. Ascending aorta caliber up to 34 mm is mildly dilated but not aneurysmal. Calcified coronary artery atherosclerosis. Stable borderline to mild cardiomegaly. No pericardial effusion. Central pulmonary arteries also enhanced and appear to be patent. Mediastinum/Nodes: Negative. No mediastinal mass or lymphadenopathy. Lungs/Pleura: Mildly lower lung volumes compared to last year. Chronic scarring in the left mid lung partially along the major fissure. Mild atelectasis and/or scarring elsewhere. No pleural effusion or acute pulmonary opacity. Major airways remain patent. Musculoskeletal: Osteopenia. No acute or suspicious osseous lesion identified. But there is subcutaneous, multi spatial contrast extravasation into the soft tissues about the right shoulder visible on series 5 images 6 through 25. Review of the MIP images confirms the above findings. CTA ABDOMEN AND PELVIS FINDINGS VASCULAR Aortoiliac calcified atherosclerosis. Negative for abdominal aortic dissection or aneurysm. Major arterial structures remain patent. Review of the MIP images confirms the above findings. NON-VASCULAR Hepatobiliary: Diminutive and mildly nodular appearance of the liver raising the possibility of cirrhosis. But maintain normal liver  enhancement. Diminutive or absent gallbladder as before. Pancreas: Negative. Spleen: Negative. 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 Adrenal glands remain normal. Decompressed left ureter. Small volume of excreted IV contrast in the bladder which otherwise appears normal. Stomach/Bowel: Large new ventral abdominal hernia containing small bowel and mesentery has a wide hernia neck of at least 5 cm (sagittal image 96) and hernia sac size of 12.7 cm diameter. Portion of the central transverse colon and mesocolon also contained within the sac. Furthermore, a small bowel ostomy courses through the hernia (series 5, image 151). No dilated large or small bowel loops. 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 Negative stomach and duodenum.  No free air or free fluid. Lymphatic: No lymphadenopathy. Reproductive: Surgically absent uterus. Diminutive or absent ovaries as before. Chronic pelvic floor laxity appears stable from last year. Other: No pelvic free fluid. Musculoskeletal: Chronic mild grade 1 spondylolisthesis in the lower lumbar spine. Osteopenia. No acute or suspicious osseous lesion. Review of the MIP images confirms the above findings. IMPRESSION: 1. Negative for aortic dissection or aneurysm. Aortic Atherosclerosis (ICD10-I70.0). 2. Earlier IV contrast injection infiltrated about the right shoulder, visible on these images. I discussed this with the CT Department, who will document the extravasation and place the post-extravasation care order-set in the EMR. 3. 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 But no evidence of hernia incarceration or bowel obstruction. 4. Small, slightly nodular appearance of the liver raising the  possibility of cirrhosis. 5. No other acute finding in the abdomen or pelvis. 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 Prior hysterectomy with pelvic floor laxity. Electronically Signed   By: Genevie Ann M.D.   On: 02/27/2021 05:24    EKG: Irregularly irregular rhythm with no P waves consistent with atrial  fibrillation   Gifford Shave, MD  02/27/2021, 8:13 AM PGY-3, [facility] pager: [phone], text pages welcome

## 2021-02-27 NOTE — ED Provider Notes (Signed)
I assumed care of this patient.  Please see previous provider note for further details of Hx, PE.  Briefly patient is a 81 y.o. female who presented N/V, upper and chest pain. Noted to have elevated lactic acid felt to be related to dehydration, but pending CT dissection to rule out mesenteric ischemia, vascular process, or inflammatory/infectious source.   CTA w/o acute process. Given IVF. LA improving but still elevated Still nauseated and not tolerating PO after several rounds of antiemetics. Admitted to [facility] for continued management.       Fatima Blank, MD 02/28/21 [phone]

## 2021-02-27 NOTE — ED Notes (Signed)
Pt wears 2L O2 as needed, both here and at home.

## 2021-02-27 NOTE — Consult Note (Signed)
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 the above findings. CTA ABDOMEN AND PELVIS FINDINGS VASCULAR Aortoiliac calcified atherosclerosis. Negative for abdominal aortic dissection or aneurysm. Major arterial structures remain patent. Review of the MIP images confirms the above findings. NON-VASCULAR Hepatobiliary: Diminutive and mildly nodular appearance of the liver raising the possibility of cirrhosis. But maintain normal liver enhancement. Diminutive or absent gallbladder as before. Pancreas: Negative. Spleen: Negative. 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 Adrenal glands remain normal. Decompressed left ureter. Small volume of excreted IV  contrast in the bladder which otherwise appears normal. Stomach/Bowel: Large new ventral abdominal hernia containing small bowel and mesentery has a wide hernia neck of at least 5 cm (sagittal image 96) and hernia sac size of 12.7 cm diameter. Portion of the central transverse colon and mesocolon also contained within the sac. Furthermore, a small bowel ostomy courses through the hernia (series 5, image 151). No dilated large or small bowel loops. 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 Negative stomach and duodenum.  No free air or free fluid. Lymphatic: No lymphadenopathy. Reproductive: Surgically absent uterus. Diminutive or absent ovaries as before. Chronic pelvic floor laxity appears stable from last year. Other: No pelvic free fluid. Musculoskeletal: Chronic mild grade 1 spondylolisthesis in the lower lumbar spine. Osteopenia. No acute or suspicious osseous lesion. Review of the MIP images confirms the above findings. IMPRESSION: 1. Negative for aortic dissection or aneurysm. Aortic Atherosclerosis (ICD10-I70.0). 2. Earlier IV contrast injection infiltrated about the right shoulder, visible on these images. I discussed this with the CT Department, who will document the extravasation and place the post-extravasation care order-set in the EMR. 3. 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 But no evidence of hernia incarceration or bowel obstruction. 4. Small, slightly nodular appearance of the liver raising the possibility of cirrhosis. 5. No other acute finding in the abdomen or pelvis. 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 Prior hysterectomy with pelvic floor laxity. Electronically Signed   By: Genevie Ann M.D.   On: 02/27/2021 05:24     Dialysis Orders:  TTS [facility]   3.5h  2/2 bath  93kg  400/1.5  Heparin none  L IJ TDC (failed LUE access, failed L thigh AVG)  - mircera 225 q2, last 1/19  - calc 1 ug tiw   Assessment/Plan:  Nausea/Vomiting: Reports acute onset nausea and vomiting since Tuesday. Has received fentanyl and ativan in the ED. Work up ongoing, per primary team Chest pain: present on arrival, CTA negative for aortic dissection or PE. No chest pain at present.   ESRD:  Dialysis on TTS schedule, last HD was Tuesday but no acute indications for RRT and patient prefers not to have dialysis today. Will plan for short HD tomorrow then resume TTS schedule.  Hypokalemia: 2/2 vomiting. Will use 4K bath with dialysis tomorrow.   Hypertension/volume: BP controlled, no volume overload on exam. Has had GI losses and poor PO intake. Minimal/no UF with HD tomorrow.   Anemia: Hgb 9.4. Due for ESA will order with HD tomorrow. Recent GI bleed, discharged last  week.   Metabolic bone disease: Calcium controlled. Continue calcitriol.   Nutrition:  Not tolerating PO at present  A.fib: mildly tachycardic but asymptomatic. On eliquis.  T2DM: insult per primary team  Anice Paganini, PA-C 02/27/2021, 9:58 AM  [facility] Pager: [phone]

## 2021-02-28 DIAGNOSIS — R079 Chest pain, unspecified: Secondary | ICD-10-CM | POA: Diagnosis not present

## 2021-02-28 DIAGNOSIS — R109 Unspecified abdominal pain: Secondary | ICD-10-CM | POA: Diagnosis not present

## 2021-02-28 DIAGNOSIS — Z6837 Body mass index (BMI) 37.0-37.9, adult: Secondary | ICD-10-CM | POA: Diagnosis not present

## 2021-02-28 DIAGNOSIS — J9610 Chronic respiratory failure, unspecified whether with hypoxia or hypercapnia: Secondary | ICD-10-CM | POA: Diagnosis present

## 2021-02-28 DIAGNOSIS — E039 Hypothyroidism, unspecified: Secondary | ICD-10-CM | POA: Diagnosis present

## 2021-02-28 DIAGNOSIS — E872 Acidosis, unspecified: Secondary | ICD-10-CM | POA: Diagnosis present

## 2021-02-28 DIAGNOSIS — F411 Generalized anxiety disorder: Secondary | ICD-10-CM | POA: Diagnosis present

## 2021-02-28 DIAGNOSIS — J45909 Unspecified asthma, uncomplicated: Secondary | ICD-10-CM | POA: Diagnosis present

## 2021-02-28 DIAGNOSIS — K219 Gastro-esophageal reflux disease without esophagitis: Secondary | ICD-10-CM | POA: Diagnosis present

## 2021-02-28 DIAGNOSIS — Z20822 Contact with and (suspected) exposure to covid-19: Secondary | ICD-10-CM | POA: Diagnosis present

## 2021-02-28 DIAGNOSIS — R112 Nausea with vomiting, unspecified: Secondary | ICD-10-CM | POA: Diagnosis present

## 2021-02-28 DIAGNOSIS — I5042 Chronic combined systolic (congestive) and diastolic (congestive) heart failure: Secondary | ICD-10-CM | POA: Diagnosis present

## 2021-02-28 DIAGNOSIS — E889 Metabolic disorder, unspecified: Secondary | ICD-10-CM | POA: Diagnosis present

## 2021-02-28 DIAGNOSIS — N186 End stage renal disease: Secondary | ICD-10-CM | POA: Diagnosis present

## 2021-02-28 DIAGNOSIS — E669 Obesity, unspecified: Secondary | ICD-10-CM | POA: Diagnosis present

## 2021-02-28 DIAGNOSIS — I4819 Other persistent atrial fibrillation: Secondary | ICD-10-CM | POA: Diagnosis present

## 2021-02-28 DIAGNOSIS — E1140 Type 2 diabetes mellitus with diabetic neuropathy, unspecified: Secondary | ICD-10-CM | POA: Diagnosis present

## 2021-02-28 DIAGNOSIS — D631 Anemia in chronic kidney disease: Secondary | ICD-10-CM | POA: Diagnosis present

## 2021-02-28 DIAGNOSIS — E876 Hypokalemia: Secondary | ICD-10-CM | POA: Diagnosis present

## 2021-02-28 DIAGNOSIS — E785 Hyperlipidemia, unspecified: Secondary | ICD-10-CM | POA: Diagnosis present

## 2021-02-28 DIAGNOSIS — I2510 Atherosclerotic heart disease of native coronary artery without angina pectoris: Secondary | ICD-10-CM | POA: Diagnosis present

## 2021-02-28 DIAGNOSIS — N2581 Secondary hyperparathyroidism of renal origin: Secondary | ICD-10-CM | POA: Diagnosis present

## 2021-02-28 DIAGNOSIS — Z992 Dependence on renal dialysis: Secondary | ICD-10-CM | POA: Diagnosis not present

## 2021-02-28 DIAGNOSIS — E1122 Type 2 diabetes mellitus with diabetic chronic kidney disease: Secondary | ICD-10-CM | POA: Diagnosis present

## 2021-02-28 DIAGNOSIS — R9431 Abnormal electrocardiogram [ECG] [EKG]: Secondary | ICD-10-CM | POA: Diagnosis present

## 2021-02-28 DIAGNOSIS — I252 Old myocardial infarction: Secondary | ICD-10-CM | POA: Diagnosis not present

## 2021-02-28 DIAGNOSIS — I132 Hypertensive heart and chronic kidney disease with heart failure and with stage 5 chronic kidney disease, or end stage renal disease: Secondary | ICD-10-CM | POA: Diagnosis present

## 2021-02-28 LAB — CBC
HCT: 32.7 % — ABNORMAL LOW (ref 36.0–46.0)
Hemoglobin: 9.2 g/dL — ABNORMAL LOW (ref 12.0–15.0)
MCH: 22.5 pg — ABNORMAL LOW (ref 26.0–34.0)
MCHC: 28.1 g/dL — ABNORMAL LOW (ref 30.0–36.0)
MCV: 80 fL (ref 80.0–100.0)
Platelets: 293 10*3/uL (ref 150–400)
RBC: 4.09 MIL/uL (ref 3.87–5.11)
RDW: 19.7 % — ABNORMAL HIGH (ref 11.5–15.5)
WBC: 10 10*3/uL (ref 4.0–10.5)
nRBC: 0 % (ref 0.0–0.2)

## 2021-02-28 LAB — GLUCOSE, CAPILLARY
Glucose-Capillary: 138 mg/dL — ABNORMAL HIGH (ref 70–99)
Glucose-Capillary: 126 mg/dL — ABNORMAL HIGH (ref 70–99)
Glucose-Capillary: 107 mg/dL — ABNORMAL HIGH (ref 70–99)
Glucose-Capillary: 123 mg/dL — ABNORMAL HIGH (ref 70–99)

## 2021-02-28 LAB — BASIC METABOLIC PANEL
Anion gap: 14 (ref 5–15)
BUN: 30 mg/dL — ABNORMAL HIGH (ref 8–23)
CO2: 23 mmol/L (ref 22–32)
Calcium: 8.9 mg/dL (ref 8.9–10.3)
Chloride: 95 mmol/L — ABNORMAL LOW (ref 98–111)
Creatinine, Ser: 8.16 mg/dL — ABNORMAL HIGH (ref 0.44–1.00)
GFR, Estimated: 5 mL/min — ABNORMAL LOW (ref >60)
Glucose, Bld: 128 mg/dL — ABNORMAL HIGH (ref 70–99)
Potassium: 4.6 mmol/L (ref 3.5–5.1)
Sodium: 132 mmol/L — ABNORMAL LOW (ref 135–145)

## 2021-02-28 MED ORDER — PROCHLORPERAZINE EDISYLATE 10 MG/2ML IJ SOLN
5.0000 mg | Freq: Three times a day (TID) | INTRAMUSCULAR | Status: DC | PRN
  Administered 2021-03-01: 5 mg via INTRAVENOUS
  Filled 2021-02-28: qty 2

## 2021-02-28 MED ORDER — PROCHLORPERAZINE MALEATE 5 MG PO TABS
5.0000 mg | ORAL_TABLET | Freq: Three times a day (TID) | ORAL | Status: DC | PRN
  Administered 2021-02-28: 5 mg via ORAL
  Filled 2021-02-28 (×3): qty 1

## 2021-02-28 MED ORDER — MIDODRINE HCL 5 MG PO TABS
10.0000 mg | ORAL_TABLET | Freq: Once | ORAL | Status: AC
  Administered 2021-02-28: 10 mg via ORAL
  Filled 2021-02-28: qty 2

## 2021-02-28 MED ORDER — SCOPOLAMINE 1 MG/3DAYS TD PT72: 1.0000 | MEDICATED_PATCH | TRANSDERMAL | Status: DC

## 2021-02-28 MED ORDER — HEPARIN SODIUM (PORCINE) 1000 UNIT/ML IJ SOLN
INTRAMUSCULAR | Status: AC
  Filled 2021-02-28: qty 4

## 2021-02-28 MED ORDER — INSULIN ASPART 100 UNIT/ML IJ SOLN: 0.0000 [IU] | Freq: Three times a day (TID) | INTRAMUSCULAR | Status: DC

## 2021-02-28 MED ORDER — PROCHLORPERAZINE EDISYLATE 10 MG/2ML IJ SOLN
5.0000 mg | Freq: Once | INTRAMUSCULAR | Status: AC
  Administered 2021-02-28: 5 mg via INTRAVENOUS
  Filled 2021-02-28: qty 2

## 2021-02-28 MED ORDER — CLONAZEPAM 0.5 MG PO TABS
0.5000 mg | ORAL_TABLET | Freq: Once | ORAL | Status: AC
  Administered 2021-02-28: 0.5 mg via ORAL
  Filled 2021-02-28: qty 1

## 2021-02-28 NOTE — Progress Notes (Signed)
FPTS Brief Progress Note  S: Primary RN at bedside. Pt c/o nausea. Has not vomited tonight. Daughter expressed frustrations over ED events. Pt has continued RUE pain from IV infiltration. Daughter reports similar emesis after getting anesthesia with previous procedure. Pt reports she can still taste it in her mouth.    Daughter reports vomiting after eating cookies and chicken salad sandwich prior to admission. Multiple family members at the chicken salad. No one else is sick. She reports her mom is at her baseline mentation but she is not eating.    Had intermittent chest pressure and feeling of choking while in the ED. Daughter reports she choked on her vomitus. No chest pain now.      O: BP (!) 111/45 (BP Location: Right Wrist)    Pulse 69    Temp 98.2 F (36.8 C) (Oral)    Resp 18    SpO2 97%    GEN: alert, no acute distress, daughter and primary RN at bedside  RESP: satting well on RA   A/P: Nausea: No emesis tonight. Pt has no appetite. Give Compazine 5 mg once. Obtain EKG. Consider GI consult.    Prolonged Qtc: EKG yesterday with Qtc 508. Will need to be cautious with antiemetics. Follow up EKG  - Orders reviewed. Labs for AM ordered, which was adjusted as needed.    Lyndee Hensen, DO 02/28/2021, 12:25 AM PGY-3, [facility] Night Resident  Please page [phone] with questions.

## 2021-02-28 NOTE — TOC Initial Note (Addendum)
Transition of Care [facility]) - Initial/Assessment Note    Patient Details  Name: [name] MRN: [id number] Date of Birth: 05-09-1940  Transition of Care [facility]) CM/SW Contact:    Marilu Favre, RN Phone Number: 02/28/2021, 1:48 PM  Clinical Narrative:                 Spoke to patient's daughter [name] at bedside. Patient asleep. Patient from home with son Laurel Dimmer also assists with her care.   Patient has a rollator, wheelchair, NEB machine and oxygen as needed at home. [name] unsure of name of [place], but she does have a portable oxygen concentrator .   PCP is Dr [name]    Expected Discharge Plan: Home/Self Care Barriers to Discharge: Continued Medical Work up   Patient Goals and CMS Choice   CMS Medicare.gov Compare Post Acute Care list provided to:: Patient    Expected Discharge Plan and Services Expected Discharge Plan: Home/Self Care   Discharge Planning Services: CM Consult   Living arrangements for the past 2 months: Single Family Home                 DME Arranged: N/A         HH Arranged: NA          Prior Living Arrangements/Services Living arrangements for the past 2 months: Single Family Home Lives with:: Adult Children              Current home services: DME    Activities of Daily Living      Permission Sought/Granted                  Emotional Assessment              Admission diagnosis:  Nausea & vomiting [R11.2] Abdominal pain, unspecified abdominal location [R10.9] Chest pain, unspecified type [R07.9] Nausea and vomiting, unspecified vomiting type [R11.2] Patient Active Problem List   Diagnosis Date Noted   Nausea & vomiting 02/27/2021   History of colon cancer    Lynch syndrome    AVM (arteriovenous malformation) of small bowel, acquired with hemorrhage    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[place])    Malignant neoplasm of descending colon ([place])    Anemia 02/16/2021   Pressure injury of  skin 02/16/2021   Blood loss anemia 02/15/2021   MSSA bacteremia 06/20/2020   Occult blood in stools    Benign neoplasm of cecum    Benign neoplasm of ascending colon    Benign neoplasm of transverse colon    Benign neoplasm of descending colon    Loss of weight    Colonic mass    Nausea without vomiting    End stage renal disease ([place]) 06/16/2019   ESRD on dialysis ([place]) 04/10/2019   Acute on chronic combined systolic and diastolic CHF (congestive heart failure) ([place]) 08/26/2018   Pulmonary edema 08/25/2018   DOE (dyspnea on exertion) 08/25/2018   Chest pain 11/21/2016   Chronic kidney disease (CKD), active medical management without dialysis, stage 5 ([place])    Cellulitis 05/30/2015   Cellulitis, abdominal wall 05/30/2015   UTI (lower urinary tract infection) 05/30/2015   Diabetes mellitus with renal manifestation ([place]) 05/30/2015   Cancer of right renal pelvis (HCC)    CKD (chronic kidney disease), stage III (HCC)    Hypertensive heart disease    Obesity (BMI 30-39.9)    Renal mass 02/20/2015   Atrial flutter ([place]) 12/31/2014   Persistent atrial  fibrillation ([place])    Atrial fibrillation ([place]) 05/13/2014   Influenza with respiratory manifestations 04/18/2014   A-fib ([place]) 04/16/2014   Arterial hypotension    Pyrexia    Renal insufficiency    Acute on chronic diastolic ACC/AHA stage C congestive heart failure (HCC)    Chronic diastolic CHF (congestive heart failure) ([place]) 09/22/2013   Acute right-sided CHF (congestive heart failure) ([place]) 08/02/2013   Bradycardia 08/02/2013   PAF (paroxysmal atrial fibrillation) ([place]) 01/11/2013   CAD (coronary artery disease) 01/11/2013   Elevated troponin 01/11/2013   Hyperlipidemia 01/11/2013   HTN (hypertension)    Syncope 01/10/2013   DM neuropathy, type II diabetes mellitus ([place]) 01/09/2013   NSTEMI (non-ST elevated myocardial infarction) ([place]) 01/08/2013   Obesity, Class III, BMI 40-49.9 (morbid obesity) ([place]) 03/05/2010    CAROTID STENOSIS 01/29/2010   UNSPECIFIED TACHYCARDIA 01/29/2010   PALPITATIONS 01/29/2010   Hypothyroidism 02/07/2009   Anxiety state 02/07/2009   Hypotension 02/07/2009   Asthma 02/07/2009   GASTROESOPHAGEAL REFLUX DISEASE, CHRONIC 02/07/2009   Osteoarthrosis, unspecified whether generalized or localized, unspecified site 02/07/2009   DYSPHAGIA UNSPECIFIED 02/07/2009   DIVERTICULITIS, HX OF 02/07/2009   PCP:  Wannetta Sender, FNP Pharmacy:   [facility] [address] [place] [address] Phone: [phone] Fax: [phone]  [facility] [address] [place] [address] Phone: [phone] Fax: [phone]     Social Determinants of Health (SDOH) Interventions    Readmission Risk Interventions No flowsheet data found.

## 2021-02-28 NOTE — Progress Notes (Signed)
OT Cancellation Note  Patient Details Name: MASIYA CLAASSEN MRN: [id number] DOB: 20-Oct-1940   Cancelled Treatment:    Reason Eval/Treat Not Completed: Patient declined. Reports fatigue s/p dialysis. OT to check back 2/4.   Gloris Manchester OTR/L Supplemental OT, Department of rehab services [phone]  Sullivan Blasing R H. 02/28/2021, 1:03 PM

## 2021-02-28 NOTE — Procedures (Signed)
Patient seen on Hemodialysis. BP 131/76    Pulse 66    Temp 98.4 F (36.9 C)    Resp 15    Wt 93 kg    SpO2 100%    BMI 37.50 kg/m   QB 400, UF goal keeping even Tolerating treatment without complaints at this time.   Allison Shiley MD [facility] Office # [phone] Pager # [phone] 10:14 AM

## 2021-02-28 NOTE — Progress Notes (Signed)
PT Cancellation Note  Patient Details Name: CAILEEN VERACRUZ MRN: [id number] DOB: 01-25-1941   Cancelled Treatment:    Reason Eval/Treat Not Completed: Patient at procedure or test/unavailable (Off floor for dialysis, PT to f/u in AM)  Urbano Milhouse A. Lamonica Trueba, PT, DPT Acute Rehabilitation Services Office: [place] 02/28/2021, 11:37 AM

## 2021-02-28 NOTE — Progress Notes (Signed)
Pt receives out-pt HD at [facility] on TTS. Pt arrives at 11:30 for 11:50 chair time. Should pt be stable for dc in the am, clinic can be contacted to see if pt can be treated on regular 2nd shift schedule post d/c. Will assist as needed.  Melven Sartorius Renal Navigator [phone]

## 2021-02-28 NOTE — Progress Notes (Signed)
Patient ID: [name], female   DOB: [birth year], 81 y.o.   MRN: [id number] [facility] Progress Note   Assessment/ Plan:   Nausea/Vomiting: Reports acute onset nausea and vomiting since Tuesday limiting oral intake. Overnight with some additional nausea and attempting medical management.  Chest pain: present on arrival, CTA negative for aortic dissection or PE. No evidence of ACS with flat troponin.  ESRD:  Dialysis on TTS schedule, she is getting a short HD today and then will resume her usual TTS schedule with dialysis again tomorrow.  Hypokalemia: 2/2 vomiting. Using 4K bath at dialysis today.  Hypertension/volume: BP controlled, no volume overload on exam. Has had GI losses and poor PO intake. Minimal/no UF with HD tomorrow.  Anemia: Hgb 9.4. ESA will be given with her HD today. Recent GI bleed.  Metabolic bone disease: Calcium controlled. Continue calcitriol.  Nutrition:  Not tolerating PO at present  A.fib: rate controlled and on eliquis.  T2DM: insulin per primary team  Subjective:   Reports a rough night with trouble sleeping and no oral intake. Plans to try and eat/drink something after hemodialysis this morning.   Objective:   BP 130/64    Pulse 66    Temp 98.4 F (36.9 C)    Resp 16    Wt 93 kg    SpO2 100%    BMI 37.50 kg/m   Physical Exam: Gen: Comfortably sleeping in dialysis CVS: Pulse regular rhythm and normal rate, s1 and s2 normal Resp: Clear to ausculation bilaterally, no rales/rhonchi NWG:NFAO, obese, non-tender, colostomy site RLQ, loud bowel sounds on auscultation Ext:No edema over legs   Labs: BMET Recent Labs  Lab 02/26/21 1948 02/28/21 0049  NA 134* 132*  K 3.1* 4.6  CL 95* 95*  CO2 22 23  GLUCOSE 89 128*  BUN 17 30*  CREATININE 6.63* 8.16*  CALCIUM 9.0 8.9   CBC Recent Labs  Lab 02/26/21 1948 02/28/21 0049  WBC 7.3 10.0  NEUTROABS 5.9  --   HGB 9.4* 9.2*  HCT 34.6* 32.7*  MCV 82.8 80.0  PLT 224 293      Medications:     amiodarone  200 mg Oral Daily   apixaban  5 mg Oral BID   calcitRIOL  1 mcg Oral Q T,Th,Sa-HD   Chlorhexidine Gluconate Cloth  6 each Topical Q0600   clonazePAM  0.5 mg Oral Once   famotidine  20 mg Oral Daily   fluticasone  1 spray Each Nare Daily   insulin aspart  0-6 Units Subcutaneous TID WC   levalbuterol  1.25 mg Inhalation TID   levothyroxine  175 mcg Oral QAC breakfast   [START ON 03/01/2021] midodrine  10 mg Oral Q T,Th,Sa-HD   Elmarie Shiley, MD 02/28/2021, 10:04 AM

## 2021-02-28 NOTE — Progress Notes (Signed)
Family Medicine Teaching Service Daily Progress Note Intern Pager: [phone]  Patient name: [name] Medical record number: [id number] Date of birth: 1940-06-05 Age: 81 y.o. Gender: female  Primary Care Provider: Wannetta Sender, FNP Consultants: Nephrololgy   Code Status: Full   Pt Overview and Major Events to Date:  2/2- admitted  Assessment and Plan: [name] is a 81 y.o. female presenting with nausea and vomiting. 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  Intractable nausea and vomiting   Headache No emesis overnight but remains nauseated. Generally feels better this morning. Received some compazine with modest relief. CT head yesterday with no acute intracranial findings to explain her sudden onset headache. Suspect could be due to dehydration. Also using 2L supplemental O2 as needed, but this is a requirement for her at baseline. Labs improving, Anion gap 17>14, K 3.1>4.6. Remains without an elevated white count (10.0). Consider that she may have an underlying component of gastroparesis, unfortunately, treatment options are limited with her prolonged QTc. - Compazine PRN for nausea - Full liquid diet - Continue Tylenol for headache - PT/OT Eval and Treat  ESRD on HD TThS - HD per nephro - Daily RFP  Paroxysmal Atrial Fibrillation on Eliquis  Was tachycardic on arrival to the ED but is now rate and rhythm controlled. Hgb stable at 9.4>9.2. No evidence of active blood loss.  - Eliquis 5mg  BID   T2DM Overnight glucose 128-143. - Continue CBGs/SSI  HTN BP wnl without need for medication at this time.   Hx Lynch Syndrome s/p hemicolectomy Colostomy site without complication.   FEN/GI: FLD, advance as tolerated PPx: On eliquis  Dispo:Pending PT recommendations  pending clinical improvement . Barriers include ongoing nausea.   Subjective:   [name] reports that she feels better today compared to yesterday but that she is still having some nausea. No emesis overnight. On further history, she reports that she has these symptoms most often when eating sweets/dessert at the end of a meal.   Objective: Temp:  [97.6 F (36.4 C)-98.4 F (36.9 C)] 98.4 F (36.9 C) (02/03 0946) Pulse Rate:  [61-73] 61 (02/03 1130) Resp:  [15-25] 15 (02/03 1130) BP: (100-141)/(45-113) 100/52 (02/03 1130) SpO2:  [96 %-100 %] 100 % (02/03 0933) Weight:  [93 kg] 93 kg (02/03 0933) Physical Exam: General: Alert, Awake, NAD Cardiovascular: Irregular rhythm, normal rate, no murmur Respiratory: Normal WOB on RA, lung sounds clear throughout Abdomen: Soft, non-tender, colostomy bag in place, without surrounding erythema Extremities: Without edema or deformity Neuro: Speech fluent, no focal deficit   Laboratory: Recent Labs  Lab 02/26/21 1948 02/28/21 0049  WBC 7.3 10.0  HGB 9.4* 9.2*  HCT 34.6* 32.7*  PLT 224 293   Recent Labs  Lab 02/26/21 1948 02/28/21 0049  NA 134* 132*  K 3.1* 4.6  CL 95* 95*  CO2 22 23  BUN 17 30*  CREATININE 6.63* 8.16*  CALCIUM 9.0 8.9  PROT 6.6  --   BILITOT 0.6  --   ALKPHOS 88  --   ALT 21  --   AST 47*  --   GLUCOSE 89 128*    Imaging/Diagnostic Tests: CT HEAD WO CONTRAST (5MM) CLINICAL DATA:  Sudden onset of headache.  EXAM: CT HEAD WITHOUT CONTRAST  TECHNIQUE: Contiguous axial images were obtained from the base of the skull through the vertex without intravenous contrast.  RADIATION DOSE REDUCTION: This exam was performed according to the departmental dose-optimization program which includes automated exposure control, adjustment of the mA and/or kV according to patient size and/or use of iterative reconstruction technique.  COMPARISON:  06/20/2020  FINDINGS: Brain: Stable appearance of small hyperdensity in the left cerebellum, likely related to a small developmental venous  anomaly or physiologic calcification, image 7 series 3. Periventricular white matter and corona radiata hypodensities favor chronic ischemic microvascular white matter disease. No intracranial hemorrhage, mass lesion, or acute CVA.  Vascular: Persistent intravascular contrast left over from the CT angiogram performed 7 hours before, probably a manifestation of reduced renal clearance of contrast and renal dysfunction. There is atherosclerotic calcification of the cavernous carotid arteries bilaterally.  Skull: Unremarkable  Sinuses/Orbits: Mild chronic left anterior ethmoid sinusitis.  Other: No supplemental non-categorized findings.  IMPRESSION: 1. No acute intracranial findings or specific finding to explain the patient's sudden onset headache. 2. Mild chronic left anterior ethmoid sinusitis. 3. Persistent intravascular contrast left over from CT angiogram 7 hours previous to this exam, suggesting reduced renal clearance of contrast from renal dysfunction. 4. Small persistent hyperdensity in the left cerebellum, likely from a small developmental venous anomaly or physiologic calcification.  Electronically Signed   By: Van Clines M.D.   On: 02/27/2021 12:11 CT Angio Chest/Abd/Pel for Dissection W and/or Wo Contrast CLINICAL DATA:  81 year old female with chest pain. History of colon carcinoma.  EXAM: CT ANGIOGRAPHY CHEST, ABDOMEN AND PELVIS  TECHNIQUE: Multidetector CT imaging through the chest, abdomen and pelvis was performed using the standard protocol during bolus administration of intravenous contrast. Multiplanar reconstructed images and MIPs were obtained and reviewed to evaluate the vascular anatomy.  RADIATION DOSE REDUCTION: This exam was performed according to the departmental dose-optimization program which includes automated exposure control, adjustment of the mA and/or kV according to patient size and/or use of iterative reconstruction  technique.  CONTRAST:  150mL OMNIPAQUE IOHEXOL 350 MG/ML SOLN  COMPARISON:  Noncontrast chest CT 2346 hours. Cancer staging CT Chest, Abdomen, and Pelvis 03/20/2020.  FINDINGS: CTA CHEST FINDINGS  Cardiovascular: Negative for thoracic aortic dissection. Calcified aortic atherosclerosis. Tortuous proximal great vessels. Ascending aorta caliber up to 34 mm is mildly dilated but not aneurysmal. Calcified coronary artery atherosclerosis. Stable borderline to mild cardiomegaly. No pericardial effusion. Central pulmonary arteries also enhanced and appear to be patent.  Mediastinum/Nodes: Negative. No mediastinal mass or lymphadenopathy.  Lungs/Pleura: Mildly lower lung volumes compared to last year. Chronic scarring in the left mid lung partially along the major fissure. Mild atelectasis and/or scarring elsewhere. No pleural effusion or acute pulmonary opacity. Major airways remain patent.  Musculoskeletal: Osteopenia. No acute or suspicious osseous lesion identified.  But there is subcutaneous, multi spatial contrast extravasation into the soft tissues about the right shoulder visible on series 5 images 6 through 25.  Review of the MIP images confirms the above findings.  CTA ABDOMEN AND PELVIS FINDINGS  VASCULAR  Aortoiliac calcified atherosclerosis. Negative for abdominal aortic dissection or aneurysm. Major arterial structures remain patent.  Review of the MIP images confirms the above findings.  NON-VASCULAR  Hepatobiliary: Diminutive and mildly nodular appearance of the liver raising the possibility of cirrhosis. But maintain normal liver enhancement. Diminutive or absent gallbladder as before.  Pancreas: Negative.  Spleen: Negative.  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 Adrenal glands remain normal. Decompressed left ureter. Small volume of excreted IV contrast in the bladder which otherwise appears  normal.  Stomach/Bowel: Large new  ventral abdominal hernia containing small bowel and mesentery has a wide hernia neck of at least 5 cm (sagittal image 96) and hernia sac size of 12.7 cm diameter. Portion of the central transverse colon and mesocolon also contained within the sac. Furthermore, a small bowel ostomy courses through the hernia (series 5, image 151).  No dilated large or small bowel loops. 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  Negative stomach and duodenum.  No free air or free fluid.  Lymphatic: No lymphadenopathy.  Reproductive: Surgically absent uterus. Diminutive or absent ovaries as before. Chronic pelvic floor laxity appears stable from last year.  Other: No pelvic free fluid.  Musculoskeletal: Chronic mild grade 1 spondylolisthesis in the lower lumbar spine. Osteopenia. No acute or suspicious osseous lesion.  Review of the MIP images confirms the above findings.  IMPRESSION: 1. Negative for aortic dissection or aneurysm. Aortic Atherosclerosis (ICD10-I70.0).  2. Earlier IV contrast injection infiltrated about the right shoulder, visible on these images. I discussed this with the CT Department, who will document the extravasation and place the post-extravasation care order-set in the EMR.  3. 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 But no evidence of hernia incarceration or bowel obstruction.  4. Small, slightly nodular appearance of the liver raising the possibility of cirrhosis.  5. No other acute finding in the abdomen or pelvis. 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 Prior hysterectomy with pelvic floor laxity.  Electronically Signed   By: Genevie Ann M.D.   On:  02/27/2021 05:24 CT CHEST WO CONTRAST CLINICAL DATA:  Chest pain  EXAM: CT CHEST WITHOUT CONTRAST  TECHNIQUE: Multidetector CT imaging of the chest was performed following the standard protocol without IV contrast.  RADIATION DOSE REDUCTION: This exam was performed according to the departmental dose-optimization program which includes automated exposure control, adjustment of the mA and/or kV according to patient size and/or use of iterative reconstruction technique.  COMPARISON:  Chest x-ray from the previous day.  FINDINGS: Cardiovascular: Initial precontrast images were obtained although functioning IV could not be utilized for subsequent CTA of the chest. Precontrast images demonstrate atherosclerotic calcifications of the thoracic aorta without aneurysmal dilatation. No hyperdense crescent to suggest acute aortic injury is noted. Calcification in the aortic valve is noted. Coronary calcifications are seen. No cardiomegaly is noted. Left jugular dialysis catheter is seen.  Mediastinum/Nodes: Thoracic inlet is within normal limits. No sizable hilar or mediastinal adenopathy is noted. The esophagus as visualized is within normal limits.  Lungs/Pleura: Lungs are well aerated bilaterally. No focal infiltrate or sizable effusion is noted. Minimal platelike scarring is again noted in the lingula similar to that seen on prior chest x-ray. No sizable pneumothorax or parenchymal nodule is seen.  Upper Abdomen: Visualized upper abdomen shows no acute abnormality.  Musculoskeletal: Degenerative changes of the thoracic spine are noted.  IMPRESSION: Limited noncontrast imaging demonstrates atherosclerotic calcifications without aneurysmal dilatation. No hyperdense crescent to suggest acute aortic abnormality is noted.  Aortic Atherosclerosis (ICD10-I70.0).  Electronically Signed   By: Inez Catalina M.D.   On: 02/27/2021 00:27    Eppie Gibson, MD 02/28/2021, 12:09  PM PGY-1, [place] Intern pager: [phone], text pages welcome]

## 2021-02-28 NOTE — Hospital Course (Addendum)
[name] is a 81 y.o. female with a history of ESRD on HD, HFpEF, PAF, HTN, CAD, HLD, hypothyroidism, Lynch syndrome s/p right hemicolectomy 04/2020 who presented with intractable nausea and vomiting. Hospital course outlined by problem below:  Intractable nausea, vomiting, headache Patient presented on 2/1 with 2 days of persistent, intractable nausea, vomiting, and headache.  In the ED was found to be in A-fib with occasional heart rates in the 120s but otherwise stable.  Admission labs with K 3.1, anion gap 17.  Lactic acidosis to 4.3 which improved to 2.4, not meeting any SIRS criteria and felt to be due to dehydration. CTA chest, abdomen, pelvis unremarkable.  CT head also unremarkable.  Patient symptomatically treated with prochlorperazine given QTC prolongation.  Started on clear liquid diet and by the time of discharge, she was tolerating regular diet without nausea or vomiting.  T2DM PTA A1c 5.9 on 01/2021.  Controlled on sliding scale insulin.  Hypothyroidism Continued on home levothyroxine.  ESRD on HD Dialyzed during admission per nephrology.  Did not stick with her normal TTS schedule due to not feeling well.  She underwent HD on 2/3 and refused her HD on 2/4.  Per nephrology, okay to resume normal HD schedule with next HD on 2/7.  PAF on apixaban Continued on home apixaban 5 mg twice daily.  She was in intermittent atrial fibrillation during admission.  Follow-up recommendations: Repeat EKG to monitor QT prolongation.  Most recent QTc prior to discharge 502. Consider gastric emptying study if nausea and vomiting persist.

## 2021-03-01 LAB — CBC
HCT: 30 % — ABNORMAL LOW (ref 36.0–46.0)
Hemoglobin: 8.5 g/dL — ABNORMAL LOW (ref 12.0–15.0)
MCH: 22.5 pg — ABNORMAL LOW (ref 26.0–34.0)
MCHC: 28.3 g/dL — ABNORMAL LOW (ref 30.0–36.0)
MCV: 79.4 fL — ABNORMAL LOW (ref 80.0–100.0)
Platelets: 308 10*3/uL (ref 150–400)
RBC: 3.78 MIL/uL — ABNORMAL LOW (ref 3.87–5.11)
RDW: 19.5 % — ABNORMAL HIGH (ref 11.5–15.5)
WBC: 9.8 10*3/uL (ref 4.0–10.5)
nRBC: 0 % (ref 0.0–0.2)

## 2021-03-01 LAB — BASIC METABOLIC PANEL
Anion gap: 15 (ref 5–15)
BUN: 17 mg/dL (ref 8–23)
CO2: 23 mmol/L (ref 22–32)
Calcium: 8.8 mg/dL — ABNORMAL LOW (ref 8.9–10.3)
Chloride: 95 mmol/L — ABNORMAL LOW (ref 98–111)
Creatinine, Ser: 5.5 mg/dL — ABNORMAL HIGH (ref 0.44–1.00)
GFR, Estimated: 7 mL/min — ABNORMAL LOW (ref >60)
Glucose, Bld: 107 mg/dL — ABNORMAL HIGH (ref 70–99)
Potassium: 3.7 mmol/L (ref 3.5–5.1)
Sodium: 133 mmol/L — ABNORMAL LOW (ref 135–145)

## 2021-03-01 LAB — GLUCOSE, CAPILLARY
Glucose-Capillary: 121 mg/dL — ABNORMAL HIGH (ref 70–99)
Glucose-Capillary: 129 mg/dL — ABNORMAL HIGH (ref 70–99)
Glucose-Capillary: 130 mg/dL — ABNORMAL HIGH (ref 70–99)
Glucose-Capillary: 150 mg/dL — ABNORMAL HIGH (ref 70–99)

## 2021-03-01 NOTE — Plan of Care (Signed)

## 2021-03-01 NOTE — Progress Notes (Signed)
Patient refused to go to Dialysis today.

## 2021-03-01 NOTE — Discharge Summary (Addendum)
[facility] Discharge Summary  Patient name: [name] Medical record number: [id number] Date of birth: 1940-02-22 Age: 81 y.o. Gender: female Date of Admission: 02/26/2021  Date of Discharge: 03/01/2021  Admitting Physician: Gifford Shave, MD  Primary Care Provider: [name], MD Consultants: nephrology  Indication for Hospitalization: Intractable nausea and vomiting  Discharge Diagnoses/Problem List:  Principal Problem:   Nausea & vomiting QTc prolongation ESRD on HD PAF CAD HLD Hypothyroidism Lynch syndrome s/p hemicolectomy T2DM  Disposition: home with home health PT  Discharge Condition: Stable  Discharge Exam:  General: Alert, resting in bed comfortably, NAD CV: Irregularly irregular, no murmurs Pulm: Clear to auscultation bilaterally, breathing comfortably on room air Abd: Soft, nontender, positive bowel sounds Ext: Warm and well perfused, no edema    Brief Hospital Course:  SAYDEE ZOLMAN is a 81 y.o. female with a history of ESRD on HD, HFpEF, PAF, HTN, CAD, HLD, hypothyroidism, Lynch syndrome s/p right hemicolectomy 04/2020 who presented with intractable nausea and vomiting. Hospital course outlined by problem below:  Intractable nausea, vomiting, headache Patient presented on 2/1 with 2 days of persistent, intractable nausea, vomiting, and headache.  In the ED was found to be in A-fib with occasional heart rates in the 120s but otherwise stable.  Admission labs with K 3.1, anion gap 17.  Lactic acidosis to 4.3 which improved to 2.4, not meeting any SIRS criteria and felt to be due to dehydration. CTA chest, abdomen, pelvis unremarkable.  CT head also unremarkable.  Patient symptomatically treated with prochlorperazine given QTC prolongation.  Started on clear liquid diet and by the time of discharge, she was tolerating regular diet without nausea or vomiting.  T2DM PTA A1c 5.9 on 01/2021.  Controlled on sliding scale  insulin.  Hypothyroidism Continued on home levothyroxine.  ESRD on HD Dialyzed during admission per nephrology.  Did not stick with her normal TTS schedule due to not feeling well.  She underwent HD on 2/3 and refused her HD on 2/4.  Per nephrology, okay to resume normal HD schedule with next HD on 2/7.  PAF on apixaban Continued on home apixaban 5 mg twice daily.  She was in intermittent atrial fibrillation during admission.  Follow-up recommendations: Repeat EKG to monitor QT prolongation.  Most recent QTc prior to discharge 502. Consider gastric emptying study if nausea and vomiting persist.    Significant Procedures: None  Significant Labs and Imaging:  Recent Labs  Lab 02/26/21 1948 02/28/21 0049 03/01/21 0140  WBC 7.3 10.0 9.8  HGB 9.4* 9.2* 8.5*  HCT 34.6* 32.7* 30.0*  PLT 224 293 308   Recent Labs  Lab 02/26/21 1948 02/26/21 2113 02/28/21 0049 03/01/21 0140  NA 134*  --  132* 133*  K 3.1*  --  4.6 3.7  CL 95*  --  95* 95*  CO2 22  --  23 23  GLUCOSE 89  --  128* 107*  BUN 17  --  30* 17  CREATININE 6.63*  --  8.16* 5.50*  CALCIUM 9.0  --  8.9 8.8*  MG  --  1.9  --   --   ALKPHOS 88  --   --   --   AST 47*  --   --   --   ALT 21  --   --   --   ALBUMIN 3.2*  --   --   --       Results/Tests Pending at Time of Discharge: none  Discharge  Medications:  Allergies as of 03/01/2021       Reactions   Adhesive [tape] Itching, Swelling, Rash, Other (See Comments)   Tears skin and causes blisters also. EKG pads will cause welps.    Avelox [moxifloxacin] Swelling, Rash   Blueberry Flavor Anaphylaxis   Cefprozil Shortness Of Breath, Rash   Tolerated ceftriaxone on 06/20/20   Cetacaine [butamben-tetracaine-benzocaine] Nausea And Vomiting, Swelling   Dicyclomine Nausea And Vomiting, Other (See Comments)   "Heart trouble"; Headaches and increased blood sugars "Heart trouble"; Headaches and increased blood sugars   Food Anaphylaxis, Other (See Comments)    Melons, Bananas, [place]aloupes, Watermelon-throat closes up and blisters    Imdur [isosorbide Nitrate] Hives, Palpitations, Other (See Comments), Rash   Headaches also   Januvia [sitagliptin] Shortness Of Breath   Lipitor [atorvastatin] Shortness Of Breath   Losartan Potassium Shortness Of Breath   Nitroglycerin Other (See Comments)   Caused cardiac arrest and feels like skin bring torn off back of head   Omeprazole Shortness Of Breath, Swelling   Oxycodone Hives, Rash   Tolerates Dilaudid Tolerates Dilaudid   Penicillins Anaphylaxis   Has patient had a PCN reaction causing immediate rash, facial/tongue/throat swelling, SOB or lightheadedness with hypotension: Yes Has patient had a PCN reaction causing severe rash involving mucus membranes or skin necrosis: No Has patient had a PCN reaction that required hospitalization Yes Has patient had a PCN reaction occurring within the last 10 years: No If all of the above answers are "NO", then may proceed with Cephalosporin use. Has patient had a PCN reaction causing immediate rash, facial/tongue/throat swelling, SOB or lightheadedness with hypotension: Yes Has patient had a PCN reaction causing severe rash involving mucus membranes or skin necrosis: No Has patient had a PCN reaction that required hospitalization Yes Has patient had a PCN reaction occurring within the last 10 years: No If all of the above answers are "NO", then may proceed with Cephalosporin use.   Prednisone Anaphylaxis   Vancomycin Anaphylaxis   Hydrocodone Hives   Tolerates Dilaudid   Latex Other (See Comments), Rash   blisters   Tamiflu [oseltamivir] Other (See Comments)   Contraindicated with other medications Patient on tikosyn, and tamiflu interfered with anti arrhythmic med Contraindicated with other medications Patient on tikosyn, and tamiflu interfered with anti arrhythmic med   Feraheme [ferumoxytol] Other (See Comments)   Sharp pain to lower back and flank    Other Other (See Comments)   Hydrogen - unknown   Lasix [furosemide] Hives, Swelling, Rash   Mupirocin Rash        Medication List     STOP taking these medications    benzonatate 200 MG capsule Commonly known as: TESSALON   insulin glargine 100 UNIT/ML Solostar Pen Commonly known as: LANTUS   lidocaine-prilocaine cream Commonly known as: EMLA   ondansetron 4 MG tablet Commonly known as: Zofran   promethazine 25 MG tablet Commonly known as: PHENERGAN       TAKE these medications    acetaminophen 500 MG tablet Commonly known as: TYLENOL Take 0.5 tablets (250 mg total) by mouth every 6 (six) hours as needed for mild pain (pain). What changed: how much to take   amiodarone 200 MG tablet Commonly known as: PACERONE TAKE 1 TABLET ONE TIME DAILY What changed: See the new instructions.   calcitRIOL 0.5 MCG capsule Commonly known as: ROCALTROL Take 2 capsules (1 mcg total) by mouth Every Tuesday,Thursday,and Saturday with dialysis.   cetirizine 10 MG tablet Commonly  known as: ZYRTEC Take 10 mg by mouth daily.   clonazePAM 1 MG tablet Commonly known as: KLONOPIN Take 1 mg by mouth 3 (three) times daily as needed for anxiety.   Eliquis 5 MG Tabs tablet Generic drug: apixaban TAKE ONE TABLET BY MOUTH TWICE DAILY What changed: how much to take   estradiol 1 MG tablet Commonly known as: ESTRACE Take 1 mg by mouth daily.   famotidine 20 MG tablet Commonly known as: PEPCID Take 20 mg by mouth daily.   fluticasone 50 MCG/ACT nasal spray Commonly known as: FLONASE Place 1 spray into both nostrils daily.   levalbuterol 1.25 MG/3ML nebulizer solution Commonly known as: XOPENEX Inhale 1.25 mg into the lungs 3 (three) times daily.   midodrine 10 MG tablet Commonly known as: PROAMATINE Take 10 mg by mouth See admin instructions. Takes 10 mg on Tuesdays, Thursdays and Saturdays. Takes 10 mg extra if blood pressure drops in between dialysis.   multivitamin Tabs  tablet Take 1 tablet by mouth at bedtime.   Synthroid 175 MCG tablet Generic drug: levothyroxine Take 175 mcg by mouth daily.   True Metrix Blood Glucose Test test strip Generic drug: glucose blood   TRUEplus Lancets 33G Misc   vitamin B-12 500 MCG tablet Commonly known as: CYANOCOBALAMIN Take 500 mcg by mouth daily.        Discharge Instructions: Please refer to Patient Instructions section of EMR for full details.  Patient was counseled important signs and symptoms that should prompt return to medical care, changes in medications, dietary instructions, activity restrictions, and follow up appointments.   Follow-Up Appointments:   Zola Button, MD 03/01/2021, 3:33 PM PGY-2, [place]

## 2021-03-01 NOTE — Progress Notes (Signed)
Pt refused to do HD and Dr. Posey Pronto notified. No order received.

## 2021-03-01 NOTE — Evaluation (Signed)
Occupational Therapy Evaluation Patient Details Name: [name] MRN: [id number] DOB: Jun 16, 1940 Today's Date: 03/01/2021   History of Present Illness 81 yo female with admit on 2/1 for HA was also recently admitted for symptomatic anemia, now with HA.  Pt is up walking again with N&V more controlled.  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 PTA pt reported that she was independent with functional mobility using a Rollator, and requiring some assist with LB ADL's. At this time, pt appears near her baseline, requiring no increased assist or guarding. Pt limited mildly by O2 needs, other wise she has no further OT needs and acute OT will sign off.       Recommendations for follow up therapy are one component of a multi-disciplinary discharge planning process, led by the attending physician.  Recommendations may be updated based on patient status, additional functional criteria and insurance authorization.   Follow Up Recommendations  No OT follow up    Assistance Recommended at Discharge PRN  Patient can return home with the following A little help with bathing/dressing/bathroom    Functional Status Assessment  Patient has had a recent decline in their functional status and demonstrates the ability to make significant improvements in function in a reasonable and predictable amount of time.  Equipment Recommendations  None recommended by OT    Recommendations for Other Services       Precautions / Restrictions Precautions Precautions: Fall Precaution Comments: on rollator chronically with O2 PRN, check sats with gait Restrictions Weight Bearing Restrictions: No      Mobility Bed Mobility Overal bed mobility: Modified Independent                  Transfers Overall transfer level: Modified independent                         Balance Overall balance assessment: Needs assistance Sitting-balance support: No upper extremity supported, Feet supported Sitting balance-Leahy Scale: Good     Standing balance support: Bilateral upper extremity supported, During functional activity Standing balance-Leahy Scale: Fair Standing balance comment: less than fair dynamically                           ADL either performed or assessed with clinical judgement   ADL Overall ADL's : At baseline                                       General ADL Comments: Overall at baseline, able to complete ADL's with typical level of assist.     Vision Baseline Vision/History: 1 Wears glasses Ability to See in Adequate Light: 0 Adequate Patient Visual Report: No change from baseline Vision Assessment?: No apparent visual deficits     Perception     Praxis      Pertinent Vitals/Pain Pain Assessment Pain Assessment: Faces Faces Pain Scale: Hurts a little bit Pain Location: HA Pain Descriptors / Indicators: Headache Pain Intervention(s): Monitored during session     Hand Dominance Right   Extremity/Trunk Assessment Upper Extremity Assessment Upper Extremity Assessment: Overall WFL for tasks assessed   Lower Extremity Assessment Lower Extremity Assessment: Overall WFL for tasks assessed   Cervical / Trunk  Assessment Cervical / Trunk Assessment: Kyphotic   Communication Communication Communication: HOH   Cognition Arousal/Alertness: Awake/alert Behavior During Therapy: WFL for tasks assessed/performed Overall Cognitive Status: Within Functional Limits for tasks assessed                                       General Comments  VSS on 2L    Exercises     Shoulder Instructions      Home Living Family/patient expects to be discharged to:: Private residence Living Arrangements: Children Available Help at Discharge: Family;Available 24 hours/day Type of  Home: House Home Access: Ramped entrance     Home Layout: One level     Bathroom Shower/Tub: Occupational psychologist: Handicapped height Bathroom Accessibility: No   Home Equipment: Rollator (4 wheels);Wheelchair - manual;BSC/3in1 (shower chair is broken)   Additional Comments: Son lives with her.  He is high-functioning autisitic.      Prior Functioning/Environment Prior Level of Function : Needs assist             Mobility Comments: on rollator with family nearby ADLs Comments: Needs assist with LB ADL's.        OT Problem List: Decreased strength;Decreased activity tolerance;Impaired balance (sitting and/or standing)      OT Treatment/Interventions: Self-care/ADL training;Therapeutic exercise;Energy conservation;DME and/or AE instruction    OT Goals(Current goals can be found in the care plan section) Acute Rehab OT Goals Patient Stated Goal: To go home OT Goal Formulation: All assessment and education complete, DC therapy Time For Goal Achievement: 03/01/21 Potential to Achieve Goals: Good  OT Frequency:      Co-evaluation              AM-PAC OT "6 Clicks" Daily Activity     Outcome Measure Help from another person eating meals?: None Help from another person taking care of personal grooming?: None Help from another person toileting, which includes using toliet, bedpan, or urinal?: None Help from another person bathing (including washing, rinsing, drying)?: A Little Help from another person to put on and taking off regular upper body clothing?: None Help from another person to put on and taking off regular lower body clothing?: A Little 6 Click Score: 22   End of Session Equipment Utilized During Treatment: Rolling walker (2 wheels) Nurse Communication: Mobility status  Activity Tolerance: Patient tolerated treatment well Patient left: in bed;with call bell/phone within reach;with family/visitor present  OT Visit Diagnosis: Unsteadiness  on feet (R26.81);Other abnormalities of gait and mobility (R26.89);Muscle weakness (generalized) (M62.81)                Time: 4008-6761 OT Time Calculation (min): 19 min Charges:  OT General Charges $OT Visit: 1 Visit OT Evaluation $OT Eval Moderate Complexity: [place]., OTR/L Acute Rehabilitation  Tonee Silverstein Elane Yolanda Bonine 03/01/2021, 5:16 PM

## 2021-03-01 NOTE — Progress Notes (Signed)
FPTS Brief Progress Note  S:Daughter requesting update. Pt reports nausea and fear of eating food however would like to go home.  Pt willing to try eating breakfast.   O: BP (!) 122/53 (BP Location: Right Arm)    Pulse 70    Temp 98.1 F (36.7 C) (Oral)    Resp 18    Wt 93.2 kg    SpO2 100%    BMI 37.58 kg/m    RESP: no resp distress   A/P: Intractable N/V: Trial soft diet. Order changed.  - Orders reviewed. Labs for AM ordered, which was adjusted as needed.    Lyndee Hensen, DO 03/01/2021, 2:25 AM PGY-2, [facility] Night Resident  Please page [phone] with questions.

## 2021-03-01 NOTE — Progress Notes (Signed)
Patient ID: [name], female   DOB: December 06, 1940, 81 y.o.   MRN: [id number] [facility] Progress Note   Assessment/ Plan:   Nausea/Vomiting: Reports acute onset nausea and vomiting since Tuesday limiting oral intake.  Yesterday with some abdominal discomfort during dialysis that persisted for couple hours after.  She was able to tolerate liquids for dinner and has tolerated a solid diet this morning. Chest pain: present on arrival, CTA negative for aortic dissection or PE. No evidence of ACS with flat troponin.  ESRD: She usually gets hemodialysis on a TTS schedule and underwent hemodialysis yesterday after missing treatment on Thursday-she declined to go to hemodialysis today (clinically stable to do this based on her current labs/volume assessment).  Next hemodialysis will be on Tuesday 2/7.  Hypokalemia: This appears to be secondary to emesis/decreased intake-corrected with 4K dialysate. Hypertension/volume: BP controlled, no volume overload on exam. Has had GI losses and poor PO intake. Minimal/no UF with HD tomorrow.  Anemia: Hgb 9.4. ESA given with hemodialysis yesterday-continue to follow in light of recent GI bleed.  Metabolic bone disease: Calcium controlled, will order phosphorus level. Continue calcitriol for PTH control.  Nutrition:  Not tolerating PO at present  A.fib: rate controlled and on eliquis.  T2DM: insulin per primary team  Subjective:   Reports that she tolerated her oral intake this morning-Per daughter "the first solid food in 5 days".  Declined hemodialysis after getting dialysis yesterday with some intermittent abdominal pain.   Objective:   BP (!) 97/59 (BP Location: Right Arm)    Pulse 63    Temp 97.9 F (36.6 C) (Oral)    Resp 18    Wt 93.2 kg    SpO2 100%    BMI 37.58 kg/m   Physical Exam: Gen: Comfortably sitting up in bed, daughter at bedside CVS: Pulse regular rhythm and normal rate, s1 and s2 normal Resp: Clear to ausculation  bilaterally, no rales/rhonchi.  Left IJ TDC QQV:ZDGL, obese, non-tender, colostomy site RLQ, normal bowel sounds Ext:No edema over legs   Labs: BMET Recent Labs  Lab 02/26/21 1948 02/28/21 0049 03/01/21 0140  NA 134* 132* 133*  K 3.1* 4.6 3.7  CL 95* 95* 95*  CO2 22 23 23   GLUCOSE 89 128* 107*  BUN 17 30* 17  CREATININE 6.63* 8.16* 5.50*  CALCIUM 9.0 8.9 8.8*   CBC Recent Labs  Lab 02/26/21 1948 02/28/21 0049 03/01/21 0140  WBC 7.3 10.0 9.8  NEUTROABS 5.9  --   --   HGB 9.4* 9.2* 8.5*  HCT 34.6* 32.7* 30.0*  MCV 82.8 80.0 79.4*  PLT 224 293 308     Medications:     amiodarone  200 mg Oral Daily   apixaban  5 mg Oral BID   calcitRIOL  1 mcg Oral Q T,Th,Sa-HD   Chlorhexidine Gluconate Cloth  6 each Topical Q0600   famotidine  20 mg Oral Daily   fluticasone  1 spray Each Nare Daily   insulin aspart  0-6 Units Subcutaneous TID WC   levalbuterol  1.25 mg Inhalation TID   levothyroxine  175 mcg Oral QAC breakfast   midodrine  10 mg Oral Q T,Th,Sa-HD   Elmarie Shiley, MD 03/01/2021, 10:23 AM

## 2021-03-01 NOTE — Evaluation (Signed)
Physical Therapy Evaluation Patient Details Name: [name] MRN: [id number] DOB: 20-Mar-1940 Today's Date: 03/01/2021  History of Present Illness  81 yo female with admit on 2/1 for HA was also recently admitted for symptomatic anemia, now with HA.  Pt is up walking again with N&V more controlled.  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  Pt has a daughter who is a caregiver and was present to offer information about pt.  Follow up with her for completion of therapy goals, and will expect her to be able to manage mobility with close monitoring of sats.  Pt has a recent URI, per daughter, and will need to be encouraged to pace herself.  Pt is also waiting for a breathing tx, and has not had one yet today.  Follow up with her for goals of PT as outlined below.     Recommendations for follow up therapy are one component of a multi-disciplinary discharge planning process, led by the attending physician.  Recommendations may be updated based on patient status, additional functional criteria and insurance authorization.  Follow Up Recommendations Home health PT    Assistance Recommended at Discharge Intermittent Supervision/Assistance  Patient can return home with the following  A little help with bathing/dressing/bathroom;Assistance with cooking/housework;Help with stairs or ramp for entrance    Equipment Recommendations None recommended by PT  Recommendations for Other Services       Functional Status Assessment Patient has had a recent decline in their functional status and demonstrates the ability to make significant improvements in function in a reasonable and predictable amount of time.     Precautions /  Restrictions Precautions Precautions: Fall Precaution Comments: on rollator chronically with O2 PRN, check sats with gait Restrictions Weight Bearing Restrictions: No      Mobility  Bed Mobility Overal bed mobility: Modified Independent                  Transfers Overall transfer level: Modified independent                      Ambulation/Gait Ambulation/Gait assistance: Supervision (for safety) Gait Distance (Feet): 70 Feet (40+30) Assistive device: Rollator (4 wheels) Gait Pattern/deviations: Wide base of support, Step-through pattern, Trunk flexed (mild trunk flexion) Gait velocity: decreased Gait velocity interpretation: <1.31 ft/sec, indicative of household ambulator   General Gait Details: pt desaturated to 77% with gait and sat to recover on room air.  Went to 99% in a minute and remained in 90's with control of speed and not talking with return trip  Financial trader [employer] (Stroke Patients Only)       Balance Overall balance assessment: Needs assistance Sitting-balance support: No upper extremity supported, Feet supported Sitting balance-Leahy Scale: Good     Standing balance support: Bilateral upper extremity supported, During functional activity Standing balance-Leahy Scale: Fair Standing balance comment: less than fair dynamically                             Pertinent Vitals/Pain Pain Assessment Pain Assessment: Faces Faces  Pain Scale: Hurts a little bit Pain Location: HA Pain Descriptors / Indicators: Headache Pain Intervention(s): Limited activity within patient's tolerance, Repositioned, Premedicated before session, Monitored during session    [place] expects to be discharged to:: Private residence Living Arrangements: Children Available Help at Discharge: Family;Available 24 hours/day Type of Home: House Home Access: Ramped entrance       Home Layout:  One level Home Equipment: Rollator (4 wheels);Wheelchair - manual;BSC/3in1 (shower chair is broken) Additional Comments: Son lives with her.  He is high-functioning autisitic.    Prior Function Prior Level of Function : Needs assist             Mobility Comments: on rollator with family nearby       Hand Dominance   Dominant Hand: Right    Extremity/Trunk Assessment   Upper Extremity Assessment Upper Extremity Assessment: Overall WFL for tasks assessed    Lower Extremity Assessment Lower Extremity Assessment: Overall WFL for tasks assessed    Cervical / Trunk Assessment Cervical / Trunk Assessment: Kyphotic  Communication   Communication: HOH  Cognition Arousal/Alertness: Awake/alert Behavior During Therapy: WFL for tasks assessed/performed Overall Cognitive Status: Within Functional Limits for tasks assessed                                          General Comments General comments (skin integrity, edema, etc.): pt was seen for progression of mobility on rollator, noted O2 sats dropping and will need to monitor, but per daughter pt had a recent cold and has been coughing and congested    Exercises     Assessment/Plan    PT Assessment Patient needs continued PT services  PT Problem List Decreased strength;Decreased balance;Decreased mobility;Decreased activity tolerance;Cardiopulmonary status limiting activity       PT Treatment Interventions DME instruction;Gait training;Functional mobility training;Therapeutic activities;Therapeutic exercise;Balance training;Neuromuscular re-education;Patient/family education    PT Goals (Current goals can be found in the Care Plan section)  Acute Rehab PT Goals Patient Stated Goal: to walk and go home PT Goal Formulation: With patient/family Time For Goal Achievement: 03/08/21 Potential to Achieve Goals: Good    Frequency Min 3X/week     Co-evaluation               AM-PAC PT "6 Clicks"  Mobility  Outcome Measure Help needed turning from your back to your side while in a flat bed without using bedrails?: None Help needed moving from lying on your back to sitting on the side of a flat bed without using bedrails?: A Little Help needed moving to and from a bed to a chair (including a wheelchair)?: A Little Help needed standing up from a chair using your arms (e.g., wheelchair or bedside chair)?: A Little Help needed to walk in hospital room?: A Little Help needed climbing 3-5 steps with a railing? : A Lot 6 Click Score: 18    End of Session Equipment Utilized During Treatment: Gait belt Activity Tolerance: Patient tolerated treatment well;Treatment limited secondary to medical complications (Comment) Patient left: in bed;with call bell/phone within reach;with family/visitor present Nurse Communication: Mobility status;Other (comment) (O2 sats) PT Visit Diagnosis: Unsteadiness on feet (R26.81);Difficulty in walking, not elsewhere classified (R26.2)    Time: 8119-1478 PT Time Calculation (min) (ACUTE ONLY): 25 min   Charges:   PT Evaluation $PT Eval Moderate Complexity: 1 Mod PT Treatments $Gait Training: 8-22 mins  Ramond Dial 03/01/2021, 1:27 PM  Mee Hives, PT PhD Acute Rehab Dept. Number: [place] and [place]

## 2021-03-02 ENCOUNTER — Telehealth (HOSPITAL_COMMUNITY): Payer: Self-pay | Admitting: Nephrology

## 2021-03-02 NOTE — Telephone Encounter (Signed)
Transition of care contact from inpatient facility  Date of discharge: 03/01/21 Date of contact: 03/02/21 Method: Phone Spoke to: [name]  Patient contacted to discuss transition of care from recent inpatient hospitalization. Patient was admitted to [facility] from 2/1 - 03/01/21 with discharge diagnosis of intractable N/V.  Her daughter expresses extreme frustration over many issues with her care. Felt like was not cared for well. Was not bathed, bed not changed, is concerned about nausea medication and home health. Daughter reports that she doesn't need PT, but does need a nurse aide to assist with medications and ADLs. Looks like was ordered for PT. Will send a message to her outpatient HD SW to follow up tomorrow and assist on getting the correct assistance for her.  Medication changes were reviewed. Looks like Zofran/phenergan stopped d/t prolonged QT. Daughter is unclear what she was supposed to use for nausea, thankfully has been ok today. I am not sure, looks like got compazine while admitted.  Patient will follow up with his/her outpatient HD unit on: Tuesday, but declined HD while admitted on Sat, so going a long time until next HD. May need extra HD on Monday. Will have HD unit call her in AM to assess.  Veneta Penton, PA-C [employer] Pager [phone]

## 2021-03-04 LAB — CULTURE, BLOOD (SINGLE)
Culture: NO GROWTH
Special Requests: ADEQUATE

## 2021-03-06 ENCOUNTER — Other Ambulatory Visit: Payer: Self-pay

## 2021-03-06 ENCOUNTER — Encounter: Payer: Self-pay | Admitting: Student

## 2021-03-06 ENCOUNTER — Ambulatory Visit (INDEPENDENT_AMBULATORY_CARE_PROVIDER_SITE_OTHER): Payer: Medicare HMO | Admitting: Student

## 2021-03-06 VITALS — BP 84/40 | HR 60 | Ht 62.0 in | Wt 206.6 lb

## 2021-03-06 DIAGNOSIS — I480 Paroxysmal atrial fibrillation: Secondary | ICD-10-CM

## 2021-03-06 DIAGNOSIS — I959 Hypotension, unspecified: Secondary | ICD-10-CM | POA: Diagnosis not present

## 2021-03-06 MED ORDER — MIDODRINE HCL 10 MG PO TABS: 10.0000 mg | ORAL_TABLET | Freq: Three times a day (TID) | ORAL | 2 refills | Status: DC
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
## 2021-03-06 NOTE — Assessment & Plan Note (Signed)
Patient's BP today was 75/66 and on repeat was 85/40.  Did not report BP to be in 90s/60s at home and has had multiple instances of lower BPs with dialysis.  She is currently on midodrine 10 mg at the day of dialysis.  She is not on any BP medication and she reports occasional orthostatic lightheadedness.  Given high age, and concern for falls we will increase midodrine to 10 mg 3 times daily.  Daughter has been advised to keep a BP diary and follow-up in 4 weeks.

## 2021-03-06 NOTE — Patient Instructions (Signed)
It was wonderful to see you today. Thank you for allowing me to be a part of your care. Below is a short summary of what we discussed at your visit today:  EKG looks good today.  Blood pressure was low today 75/66 and a repeat was 85/40.  Increase your midodrine frequency to 3 times daily.  Follow-up in 4 weeks for blood pressure and nausea/vomiting.  Please bring all of your medications to every appointment!  If you have any questions or concerns, please do not hesitate to contact us via phone or MyChart message.   [name], MD [facility]

## 2021-03-17 NOTE — Progress Notes (Signed)
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 maintained with dialysis. Last EF 50-55%.   5.  DM 2:     I will follow up in 4 months.    Medication Adjustments/Labs and Tests Ordered: Current medicines are reviewed at length with the patient today.  Concerns regarding medicines are outlined above.  Medication changes, Labs and Tests ordered today are listed in the Patient Instructions below. There are no Patient Instructions on file for this visit.   Signed, Amiri Riechers [name], MD,FACC 03/24/2021 2:58 PM    [facility]

## 2021-03-19 ENCOUNTER — Emergency Department (HOSPITAL_COMMUNITY): Payer: Medicare HMO

## 2021-03-19 ENCOUNTER — Encounter (HOSPITAL_COMMUNITY): Payer: Self-pay

## 2021-03-19 ENCOUNTER — Inpatient Hospital Stay (HOSPITAL_COMMUNITY): Payer: Medicare HMO

## 2021-03-19 ENCOUNTER — Telehealth: Payer: Self-pay

## 2021-03-19 ENCOUNTER — Inpatient Hospital Stay (HOSPITAL_COMMUNITY)
Admission: EM | Admit: 2021-03-19 | Discharge: 2021-03-23 | DRG: 073 | Disposition: A | Discharge status: Home Health | Payer: Medicare HMO | Attending: Family Medicine | Admitting: Family Medicine

## 2021-03-19 ENCOUNTER — Other Ambulatory Visit: Payer: Self-pay

## 2021-03-19 DIAGNOSIS — D500 Iron deficiency anemia secondary to blood loss (chronic): Secondary | ICD-10-CM | POA: Diagnosis present

## 2021-03-19 DIAGNOSIS — E66813 Obesity, class 3: Secondary | ICD-10-CM | POA: Diagnosis present

## 2021-03-19 DIAGNOSIS — E118 Type 2 diabetes mellitus with unspecified complications: Secondary | ICD-10-CM | POA: Diagnosis present

## 2021-03-19 DIAGNOSIS — I4891 Unspecified atrial fibrillation: Secondary | ICD-10-CM | POA: Diagnosis present

## 2021-03-19 DIAGNOSIS — E039 Hypothyroidism, unspecified: Secondary | ICD-10-CM | POA: Diagnosis present

## 2021-03-19 DIAGNOSIS — E1129 Type 2 diabetes mellitus with other diabetic kidney complication: Secondary | ICD-10-CM | POA: Diagnosis present

## 2021-03-19 DIAGNOSIS — I428 Other cardiomyopathies: Secondary | ICD-10-CM | POA: Diagnosis present

## 2021-03-19 DIAGNOSIS — Z79890 Hormone replacement therapy (postmenopausal): Secondary | ICD-10-CM

## 2021-03-19 DIAGNOSIS — N186 End stage renal disease: Secondary | ICD-10-CM | POA: Diagnosis present

## 2021-03-19 DIAGNOSIS — K3184 Gastroparesis: Secondary | ICD-10-CM | POA: Diagnosis present

## 2021-03-19 DIAGNOSIS — Z6837 Body mass index (BMI) 37.0-37.9, adult: Secondary | ICD-10-CM

## 2021-03-19 DIAGNOSIS — Z833 Family history of diabetes mellitus: Secondary | ICD-10-CM

## 2021-03-19 DIAGNOSIS — E871 Hypo-osmolality and hyponatremia: Secondary | ICD-10-CM

## 2021-03-19 DIAGNOSIS — E1140 Type 2 diabetes mellitus with diabetic neuropathy, unspecified: Secondary | ICD-10-CM | POA: Diagnosis present

## 2021-03-19 DIAGNOSIS — Z7901 Long term (current) use of anticoagulants: Secondary | ICD-10-CM

## 2021-03-19 DIAGNOSIS — I252 Old myocardial infarction: Secondary | ICD-10-CM

## 2021-03-19 DIAGNOSIS — Z880 Allergy status to penicillin: Secondary | ICD-10-CM

## 2021-03-19 DIAGNOSIS — Z91048 Other nonmedicinal substance allergy status: Secondary | ICD-10-CM

## 2021-03-19 DIAGNOSIS — K219 Gastro-esophageal reflux disease without esophagitis: Secondary | ICD-10-CM | POA: Diagnosis present

## 2021-03-19 DIAGNOSIS — Z85038 Personal history of other malignant neoplasm of large intestine: Secondary | ICD-10-CM

## 2021-03-19 DIAGNOSIS — I132 Hypertensive heart and chronic kidney disease with heart failure and with stage 5 chronic kidney disease, or end stage renal disease: Secondary | ICD-10-CM | POA: Diagnosis present

## 2021-03-19 DIAGNOSIS — G4730 Sleep apnea, unspecified: Secondary | ICD-10-CM | POA: Diagnosis present

## 2021-03-19 DIAGNOSIS — I953 Hypotension of hemodialysis: Secondary | ICD-10-CM | POA: Diagnosis present

## 2021-03-19 DIAGNOSIS — Z992 Dependence on renal dialysis: Secondary | ICD-10-CM

## 2021-03-19 DIAGNOSIS — E875 Hyperkalemia: Secondary | ICD-10-CM | POA: Diagnosis not present

## 2021-03-19 DIAGNOSIS — N189 Chronic kidney disease, unspecified: Secondary | ICD-10-CM | POA: Diagnosis present

## 2021-03-19 DIAGNOSIS — I2510 Atherosclerotic heart disease of native coronary artery without angina pectoris: Secondary | ICD-10-CM | POA: Diagnosis present

## 2021-03-19 DIAGNOSIS — D631 Anemia in chronic kidney disease: Secondary | ICD-10-CM | POA: Diagnosis present

## 2021-03-19 DIAGNOSIS — Z9981 Dependence on supplemental oxygen: Secondary | ICD-10-CM

## 2021-03-19 DIAGNOSIS — Z79818 Long term (current) use of other agents affecting estrogen receptors and estrogen levels: Secondary | ICD-10-CM

## 2021-03-19 DIAGNOSIS — M898X9 Other specified disorders of bone, unspecified site: Secondary | ICD-10-CM | POA: Diagnosis present

## 2021-03-19 DIAGNOSIS — Z885 Allergy status to narcotic agent status: Secondary | ICD-10-CM

## 2021-03-19 DIAGNOSIS — E1143 Type 2 diabetes mellitus with diabetic autonomic (poly)neuropathy: Principal | ICD-10-CM

## 2021-03-19 DIAGNOSIS — M109 Gout, unspecified: Secondary | ICD-10-CM | POA: Diagnosis present

## 2021-03-19 DIAGNOSIS — X58XXXA Exposure to other specified factors, initial encounter: Secondary | ICD-10-CM | POA: Diagnosis present

## 2021-03-19 DIAGNOSIS — K582 Mixed irritable bowel syndrome: Secondary | ICD-10-CM | POA: Insufficient documentation

## 2021-03-19 DIAGNOSIS — Z881 Allergy status to other antibiotic agents status: Secondary | ICD-10-CM

## 2021-03-19 DIAGNOSIS — R531 Weakness: Secondary | ICD-10-CM

## 2021-03-19 DIAGNOSIS — Z888 Allergy status to other drugs, medicaments and biological substances status: Secondary | ICD-10-CM

## 2021-03-19 DIAGNOSIS — Z91018 Allergy to other foods: Secondary | ICD-10-CM

## 2021-03-19 DIAGNOSIS — E860 Dehydration: Secondary | ICD-10-CM | POA: Diagnosis present

## 2021-03-19 DIAGNOSIS — Z933 Colostomy status: Secondary | ICD-10-CM

## 2021-03-19 DIAGNOSIS — S99929A Unspecified injury of unspecified foot, initial encounter: Secondary | ICD-10-CM

## 2021-03-19 DIAGNOSIS — E785 Hyperlipidemia, unspecified: Secondary | ICD-10-CM | POA: Diagnosis present

## 2021-03-19 DIAGNOSIS — Z9049 Acquired absence of other specified parts of digestive tract: Secondary | ICD-10-CM

## 2021-03-19 DIAGNOSIS — N2581 Secondary hyperparathyroidism of renal origin: Secondary | ICD-10-CM | POA: Diagnosis present

## 2021-03-19 DIAGNOSIS — S91105A Unspecified open wound of left lesser toe(s) without damage to nail, initial encounter: Secondary | ICD-10-CM | POA: Diagnosis present

## 2021-03-19 DIAGNOSIS — Z8249 Family history of ischemic heart disease and other diseases of the circulatory system: Secondary | ICD-10-CM

## 2021-03-19 DIAGNOSIS — Z8616 Personal history of COVID-19: Secondary | ICD-10-CM

## 2021-03-19 DIAGNOSIS — K589 Irritable bowel syndrome without diarrhea: Secondary | ICD-10-CM | POA: Diagnosis present

## 2021-03-19 DIAGNOSIS — R112 Nausea with vomiting, unspecified: Secondary | ICD-10-CM | POA: Diagnosis present

## 2021-03-19 DIAGNOSIS — E1122 Type 2 diabetes mellitus with diabetic chronic kidney disease: Secondary | ICD-10-CM | POA: Diagnosis present

## 2021-03-19 DIAGNOSIS — J45909 Unspecified asthma, uncomplicated: Secondary | ICD-10-CM | POA: Diagnosis present

## 2021-03-19 DIAGNOSIS — Z79899 Other long term (current) drug therapy: Secondary | ICD-10-CM

## 2021-03-19 DIAGNOSIS — Z91040 Latex allergy status: Secondary | ICD-10-CM

## 2021-03-19 DIAGNOSIS — I5042 Chronic combined systolic (congestive) and diastolic (congestive) heart failure: Secondary | ICD-10-CM | POA: Diagnosis present

## 2021-03-19 DIAGNOSIS — I480 Paroxysmal atrial fibrillation: Secondary | ICD-10-CM | POA: Diagnosis present

## 2021-03-19 DIAGNOSIS — E1169 Type 2 diabetes mellitus with other specified complication: Secondary | ICD-10-CM | POA: Diagnosis present

## 2021-03-19 DIAGNOSIS — F411 Generalized anxiety disorder: Secondary | ICD-10-CM | POA: Diagnosis present

## 2021-03-19 HISTORY — DX: Other fecal abnormalities: R19.5

## 2021-03-19 HISTORY — DX: Malignant neoplasm of descending colon: C18.6

## 2021-03-19 HISTORY — DX: Other persistent atrial fibrillation: I48.19

## 2021-03-19 HISTORY — DX: Acute on chronic diastolic (congestive) heart failure: I50.33

## 2021-03-19 HISTORY — DX: Malignant neoplasm of ascending colon: C18.2

## 2021-03-19 LAB — COMPREHENSIVE METABOLIC PANEL
ALT: 30 U/L (ref 0–44)
AST: 63 U/L — ABNORMAL HIGH (ref 15–41)
Albumin: 2.4 g/dL — ABNORMAL LOW (ref 3.5–5.0)
Alkaline Phosphatase: 203 U/L — ABNORMAL HIGH (ref 38–126)
Anion gap: 13 (ref 5–15)
BUN: 42 mg/dL — ABNORMAL HIGH (ref 8–23)
CO2: 26 mmol/L (ref 22–32)
Calcium: 8.6 mg/dL — ABNORMAL LOW (ref 8.9–10.3)
Chloride: 89 mmol/L — ABNORMAL LOW (ref 98–111)
Creatinine, Ser: 9.78 mg/dL — ABNORMAL HIGH (ref 0.44–1.00)
GFR, Estimated: 4 mL/min — ABNORMAL LOW (ref >60)
Glucose, Bld: 227 mg/dL — ABNORMAL HIGH (ref 70–99)
Potassium: 5.1 mmol/L (ref 3.5–5.1)
Sodium: 128 mmol/L — ABNORMAL LOW (ref 135–145)
Total Bilirubin: 1.2 mg/dL (ref 0.3–1.2)
Total Protein: 5.6 g/dL — ABNORMAL LOW (ref 6.5–8.1)

## 2021-03-19 LAB — CBC WITH DIFFERENTIAL/PLATELET
Abs Immature Granulocytes: 0.07 10*3/uL (ref 0.00–0.07)
Basophils Absolute: 0.1 10*3/uL (ref 0.0–0.1)
Basophils Relative: 0 %
Eosinophils Absolute: 0 10*3/uL (ref 0.0–0.5)
Eosinophils Relative: 0 %
HCT: 34.2 % — ABNORMAL LOW (ref 36.0–46.0)
Hemoglobin: 9.8 g/dL — ABNORMAL LOW (ref 12.0–15.0)
Immature Granulocytes: 0 %
Lymphocytes Relative: 10 %
Lymphs Abs: 1.6 10*3/uL (ref 0.7–4.0)
MCH: 23 pg — ABNORMAL LOW (ref 26.0–34.0)
MCHC: 28.7 g/dL — ABNORMAL LOW (ref 30.0–36.0)
MCV: 80.1 fL (ref 80.0–100.0)
Monocytes Absolute: 1.2 10*3/uL — ABNORMAL HIGH (ref 0.1–1.0)
Monocytes Relative: 7 %
Neutro Abs: 13.2 10*3/uL — ABNORMAL HIGH (ref 1.7–7.7)
Neutrophils Relative %: 83 %
Platelets: 348 10*3/uL (ref 150–400)
RBC: 4.27 MIL/uL (ref 3.87–5.11)
RDW: 21.5 % — ABNORMAL HIGH (ref 11.5–15.5)
WBC: 16.1 10*3/uL — ABNORMAL HIGH (ref 4.0–10.5)
nRBC: 0 % (ref 0.0–0.2)

## 2021-03-19 LAB — RESP PANEL BY RT-PCR (FLU A&B, COVID) ARPGX2
Influenza A by PCR: NEGATIVE
Influenza B by PCR: NEGATIVE
SARS Coronavirus 2 by RT PCR: NEGATIVE

## 2021-03-19 LAB — I-STAT CHEM 8, ED
BUN: 63 mg/dL — ABNORMAL HIGH (ref 8–23)
Calcium, Ion: 0.98 mmol/L — ABNORMAL LOW (ref 1.15–1.40)
Chloride: 91 mmol/L — ABNORMAL LOW (ref 98–111)
Creatinine, Ser: 11.2 mg/dL — ABNORMAL HIGH (ref 0.44–1.00)
Glucose, Bld: 199 mg/dL — ABNORMAL HIGH (ref 70–99)
HCT: 36 % (ref 36.0–46.0)
Hemoglobin: 12.2 g/dL (ref 12.0–15.0)
Potassium: 6.3 mmol/L (ref 3.5–5.1)
Sodium: 126 mmol/L — ABNORMAL LOW (ref 135–145)
TCO2: 29 mmol/L (ref 22–32)

## 2021-03-19 LAB — LACTIC ACID, PLASMA
Lactic Acid, Venous: 3.3 mmol/L (ref 0.5–1.9)
Lactic Acid, Venous: 2.9 mmol/L (ref 0.5–1.9)

## 2021-03-19 LAB — TROPONIN I (HIGH SENSITIVITY)
Troponin I (High Sensitivity): 19 ng/L — ABNORMAL HIGH (ref <18)
Troponin I (High Sensitivity): 15 ng/L (ref <18)

## 2021-03-19 LAB — APTT: aPTT: 42 seconds — ABNORMAL HIGH (ref 24–36)

## 2021-03-19 LAB — PROTIME-INR
INR: 2.1 — ABNORMAL HIGH (ref 0.8–1.2)
Prothrombin Time: 23.5 seconds — ABNORMAL HIGH (ref 11.4–15.2)

## 2021-03-19 MED ORDER — FAMOTIDINE 20 MG PO TABS
20.0000 mg | ORAL_TABLET | Freq: Every morning | ORAL | Status: DC
  Administered 2021-03-20 – 2021-03-22 (×3): 20 mg via ORAL
  Filled 2021-03-19 (×3): qty 1

## 2021-03-19 MED ORDER — SODIUM CHLORIDE 0.9 % IV BOLUS
500.0000 mL | Freq: Once | INTRAVENOUS | Status: AC
  Administered 2021-03-19: 500 mL via INTRAVENOUS

## 2021-03-19 MED ORDER — CLONAZEPAM 0.5 MG PO TABS
1.0000 mg | ORAL_TABLET | Freq: Once | ORAL | Status: AC
  Administered 2021-03-19: 1 mg via ORAL
  Filled 2021-03-19: qty 2

## 2021-03-19 MED ORDER — ESTRADIOL 1 MG PO TABS
1.0000 mg | ORAL_TABLET | Freq: Every day | ORAL | Status: DC
  Filled 2021-03-19: qty 1

## 2021-03-19 MED ORDER — PROMETHAZINE HCL 25 MG PO TABS: 12.5000 mg | ORAL_TABLET | Freq: Four times a day (QID) | ORAL | Status: DC | PRN

## 2021-03-19 MED ORDER — AMIODARONE HCL 200 MG PO TABS
200.0000 mg | ORAL_TABLET | Freq: Every morning | ORAL | Status: DC
  Administered 2021-03-20 – 2021-03-21 (×2): 200 mg via ORAL
  Filled 2021-03-19 (×2): qty 1

## 2021-03-19 MED ORDER — RENA-VITE PO TABS
1.0000 | ORAL_TABLET | Freq: Every day | ORAL | Status: DC
  Administered 2021-03-19 – 2021-03-21 (×3): 1 via ORAL
  Filled 2021-03-19 (×4): qty 1

## 2021-03-19 MED ORDER — LORATADINE 10 MG PO TABS
10.0000 mg | ORAL_TABLET | Freq: Every day | ORAL | Status: DC
  Administered 2021-03-20 – 2021-03-21 (×2): 10 mg via ORAL
  Filled 2021-03-19 (×2): qty 1

## 2021-03-19 MED ORDER — VITAMIN B-12 100 MCG PO TABS
500.0000 ug | ORAL_TABLET | Freq: Every day | ORAL | Status: DC
  Administered 2021-03-20 – 2021-03-21 (×2): 500 ug via ORAL
  Filled 2021-03-19: qty 5
  Filled 2021-03-19: qty 1

## 2021-03-19 MED ORDER — ALBUTEROL SULFATE (2.5 MG/3ML) 0.083% IN NEBU: 3.0000 mL | INHALATION_SOLUTION | Freq: Four times a day (QID) | RESPIRATORY_TRACT | Status: DC | PRN

## 2021-03-19 MED ORDER — LEVOTHYROXINE SODIUM 75 MCG PO TABS
175.0000 ug | ORAL_TABLET | Freq: Every day | ORAL | Status: DC
  Administered 2021-03-20 – 2021-03-22 (×3): 175 ug via ORAL
  Filled 2021-03-19 (×3): qty 1

## 2021-03-19 MED ORDER — FLUTICASONE PROPIONATE 50 MCG/ACT NA SUSP
1.0000 | Freq: Every day | NASAL | Status: DC
  Filled 2021-03-19 (×2): qty 16

## 2021-03-19 MED ORDER — APIXABAN 5 MG PO TABS: 5.0000 mg | ORAL_TABLET | Freq: Two times a day (BID) | ORAL | Status: DC

## 2021-03-19 NOTE — Hospital Course (Addendum)
[name] is a 81 y.o. female with presenting with fatigue, nausea and vomiting for 3 days and found to have electrolyte derangements. 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 Hospital course is outlined below:   Fatigue   Nausea, Vomiting   Electrolyte Derangements with ESRD  Patient has a h/o gastroparesis that is likely exacerbated and the cause of abdominal symptoms. Nausea and vomiting was treated symptomatically with phenergan initially and advancing diet as tolerated.  Patient had a foul smelling urine with UA that showed many bacteria and >50 wbc, but we were instructed not to treat with nephrology recommendations. We were able to advance diet, and patient was tolerating a regular diet prior to discharge. She also saw nutrition prior to discharge and discussed a diet plan that worked for her. Pt needs to see [facility] at [facility] for motility assistance.   ESRD on HD TTS   ACD   Hypotension with HD Pt received HD while inpatient and patient tends to receive midodrine during hypotension episodes with HD.  Per patient she receives midodrine prior to, during, and sometimes after hemodialysis.  She had soft blood pressures throughout her admission, and she seemed to be symptomatic with orthostatic hypotension.  Patient saw PT and OT during admission.  While in the hospital, nephrology increased her midodrine doses that seem to provide benefit to her symptoms.  She will follow-up with nephrology for HD, deferring to nephrology for midodrine dosage.  PAF on Eliquis There is initial concern about a bleed in patient's colostomy bag on admission.  However, she did not present with any active bleeding during the admission.  We did dose adjust her Eliquis to 2.5 mg twice daily.  She was discharged on this  dose.  Hemoglobin stable at discharge to 8.1.  She will need a recheck CBC to make sure her hemoglobin stays above transfusion threshold of 8.  Other chronic and stable conditions managed appropriately.  Issues for follow up Recheck CBC to ensure Hgb stable soon after discharge. Transfusion threshold 8.  Patient needs referral to [facility] at [facility] for motility assistance Continue with HD outpatient Discharged on Eliquis 2.5 mg BID

## 2021-03-19 NOTE — ED Notes (Signed)
MD made aware of lab results.

## 2021-03-19 NOTE — Telephone Encounter (Signed)
[name] calls nurse line regarding issues with ostomy bag, abdominal pain and vomiting. Reports that patient is currently able to tolerate fluids.   Daughter states that she took patient to ED at [facility] yesterday, however, left due to long wait times. Provider called them back and asked them to either come back or go to nearest ED due to elevated WBC and need for CT scan. Patient was also noted to have low BP in ED yesterday at 95/43. Daughter states that BP today is 101/62.  Advised daughter that I would recommend them to go to the ED for further evaluation.   Daughter verbalizes understanding.   FYI to PCP.   Talbot Grumbling, RN

## 2021-03-19 NOTE — H&P (Addendum)
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    Recent Labs  Lab 03/19/21 1805  NA 128*  K 5.1  CL 89*  CO2 26  BUN 42*  CREATININE 9.78*  GLUCOSE 227*  CALCIUM 8.6*     EKG: 78 bpm, QTc of 446, accelerated junctional rhythm  Precious Gilding, DO 03/19/2021, 10:58 PM PGY-1, [facility] pager: [phone], text pages welcome     FPTS Upper-Level Resident Addendum   I have independently interviewed and examined the patient. I have discussed the above with the original author and agree with their documentation. My edits for correction/addition/clarification are in within the document. Please see also any attending notes.   Rise Patience, DO  PGY-2, [facility] 03/20/2021 12:12 AM  FPTS  Service pager: [phone] (text pages welcome through [facility])

## 2021-03-19 NOTE — ED Notes (Signed)
Lab results was given to Nurse. 

## 2021-03-19 NOTE — ED Provider Notes (Signed)
[facility] EMERGENCY DEPARTMENT Provider Note   CSN: [id number] Arrival date & time: 03/19/21  1600     History  Chief Complaint  Patient presents with   abnormal labs    [name] is a 81 y.o. female.  81 year old female with prior medical history as detailed below presents for evaluation.  Patient is presenting with her daughter.  Patient complains of generalized nausea, vomiting, and weakness.  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  She reports intermittent visualization of bright red blood from the ostomy.  No bleeding noted today.  No bloody emesis.  Patient is a dialysis patient with typical dialysis sessions occurring on Tuesday, Thursday, and Saturday.  Her last reported dialysis session was on Saturday.  Patient denies fever.  Patient denies chest pain or shortness of breath.  The history is provided by the patient, medical records and a relative.  Illness Location:  Weakness, fatigue, nausea, vomiting, decreased p.o. intake, intermittent blood in ileostomy output Severity:  Mild Onset quality:  Gradual Duration:  4 days Timing:  Intermittent Progression:  Waxing and waning Chronicity:  New     Home Medications Prior to Admission medications   Medication Sig Start Date End Date Taking? Authorizing Provider  acetaminophen (TYLENOL) 500 MG tablet Take 0.5 tablets (250 mg total) by mouth every 6 (six) hours as needed for mild pain (pain). Patient taking differently: Take 1,000 mg by mouth every 6 (six) hours as needed for mild pain (pain). 11/22/16   Aline August, MD  amiodarone (PACERONE) 200 MG tablet TAKE 1 TABLET ONE TIME DAILY Patient taking differently: Take 200 mg by mouth daily. 08/01/20   [name], Nijah Orlich M, MD  apixaban (ELIQUIS) 5 MG TABS tablet TAKE ONE TABLET BY MOUTH TWICE DAILY Patient taking differently: Take 5 mg by mouth 2 (two) times daily. 10/18/20   [name], Millard Bautch M, MD  calcitRIOL (ROCALTROL) 0.5 MCG capsule Take 2 capsules (1 mcg  total) by mouth Every Tuesday,Thursday,and Saturday with dialysis. 02/20/21   Lenoria Chime, MD  cetirizine (ZYRTEC) 10 MG tablet Take 10 mg by mouth daily.     [provider]  clonazePAM (KLONOPIN) 1 MG tablet Take 1 mg by mouth 3 (three) times daily as needed for anxiety.    [provider]  estradiol (ESTRACE) 1 MG tablet Take 1 mg by mouth daily.    [provider]  famotidine (PEPCID) 20 MG tablet Take 20 mg by mouth daily.    [provider]  fluticasone (FLONASE) 50 MCG/ACT nasal spray Place 1 spray into both nostrils daily. 10/25/19   [provider]  levalbuterol Penne Lash) 1.25 MG/3ML nebulizer solution Inhale 1.25 mg into the lungs 3 (three) times daily. 02/17/21   [provider]  midodrine (PROAMATINE) 10 MG tablet Take 1 tablet (10 mg total) by mouth 3 (three) times daily. Takes 10 mg on Tuesdays, Thursdays and Saturdays. Takes 10 mg extra if blood pressure drops in between dialysis. 03/06/21 06/04/21  [name], MD  multivitamin (RENA-VIT) TABS tablet Take 1 tablet by mouth at bedtime.    [provider]  SYNTHROID 175 MCG tablet Take 175 mcg by mouth daily. 08/14/19   [provider]  TRUE METRIX BLOOD GLUCOSE TEST test strip  06/29/19   [provider]  TRUEplus Lancets 33G [place]  06/29/19   [provider]  vitamin B-12 (CYANOCOBALAMIN) 500 MCG tablet Take 500 mcg by mouth daily.    [provider]  Allergies    Adhesive [tape], Avelox [moxifloxacin], Blueberry flavor, Cefprozil, Cetacaine [butamben-tetracaine-benzocaine], Dicyclomine, Food, Imdur [isosorbide nitrate], Januvia [sitagliptin], Lipitor [atorvastatin], Losartan potassium, Nitroglycerin, Omeprazole, Oxycodone, Penicillins, Prednisone, Vancomycin, Hydrocodone, Latex, Tamiflu [oseltamivir], Feraheme [ferumoxytol], Other, Lasix [furosemide], and Mupirocin    Review of Systems   Review of Systems  All other systems reviewed  and are negative.  Physical Exam Updated Vital Signs BP 112/65    Pulse 70    Temp 98.1 F (36.7 C) (Oral)    Resp 17    Ht 5\' 2"  (1.575 m)    Wt 93.4 kg    SpO2 99%    BMI 37.68 kg/m  Physical Exam Vitals and nursing note reviewed.  Constitutional:      General: She is not in acute distress.    Appearance: Normal appearance. She is well-developed.  HENT:     Head: Normocephalic and atraumatic.  Eyes:     Conjunctiva/sclera: Conjunctivae normal.     Pupils: Pupils are equal, round, and reactive to light.  Cardiovascular:     Rate and Rhythm: Normal rate and regular rhythm.     Heart sounds: Normal heart sounds.  Pulmonary:     Effort: Pulmonary effort is normal. No respiratory distress.     Breath sounds: Normal breath sounds.  Abdominal:     General: There is no distension.     Palpations: Abdomen is soft.     Tenderness: There is no abdominal tenderness.     Comments: Ileostomy site is without surrounding erythema.  No gross hemorrhage noted in output.  Musculoskeletal:        General: No deformity. Normal range of motion.     Cervical back: Normal range of motion and neck supple.  Skin:    General: Skin is warm and dry.  Neurological:     General: No focal deficit present.     Mental Status: She is alert and oriented to person, place, and time. Mental status is at baseline.    ED Results / Procedures / Treatments   Labs (all labs ordered are listed, but only abnormal results are displayed) Labs Reviewed  LACTIC ACID, PLASMA - Abnormal; Notable for the following components:      Result Value   Lactic Acid, Venous 3.3 (*)    All other components within normal limits  CBC WITH DIFFERENTIAL/PLATELET - Abnormal; Notable for the following components:   WBC 16.1 (*)    Hemoglobin 9.8 (*)    HCT 34.2 (*)    MCH 23.0 (*)    MCHC 28.7 (*)    RDW 21.5 (*)    Neutro Abs 13.2 (*)    Monocytes Absolute 1.2 (*)    All other components within normal limits  PROTIME-INR -  Abnormal; Notable for the following components:   Prothrombin Time 23.5 (*)    INR 2.1 (*)    All other components within normal limits  APTT - Abnormal; Notable for the following components:   aPTT 42 (*)    All other components within normal limits  COMPREHENSIVE METABOLIC PANEL - Abnormal; Notable for the following components:   Sodium 128 (*)    Chloride 89 (*)    Glucose, Bld 227 (*)    BUN 42 (*)    Creatinine, Ser 9.78 (*)    Calcium 8.6 (*)    Total Protein 5.6 (*)    Albumin 2.4 (*)    AST 63 (*)    Alkaline Phosphatase 203 (*)  GFR, Estimated 4 (*)    All other components within normal limits  I-STAT CHEM 8, ED - Abnormal; Notable for the following components:   Sodium 126 (*)    Potassium 6.3 (*)    Chloride 91 (*)    BUN 63 (*)    Creatinine, Ser 11.20 (*)    Glucose, Bld 199 (*)    Calcium, Ion 0.98 (*)    All other components within normal limits  TROPONIN I (HIGH SENSITIVITY) - Abnormal; Notable for the following components:   Troponin I (High Sensitivity) 19 (*)    All other components within normal limits  RESP PANEL BY RT-PCR (FLU A&B, COVID) ARPGX2  CULTURE, BLOOD (ROUTINE X 2)  CULTURE, BLOOD (ROUTINE X 2)  URINE CULTURE  LACTIC ACID, PLASMA  URINALYSIS, ROUTINE W REFLEX MICROSCOPIC  TYPE AND SCREEN  TROPONIN I (HIGH SENSITIVITY)    EKG EKG Interpretation  Date/Time:  Wednesday March 19 2021 16:18:24 EST Ventricular Rate:  78 PR Interval:    QRS Duration: 96 QT Interval:  392 QTC Calculation: 446 R Axis:   67 Text Interpretation: Accelerated Junctional rhythm Low voltage QRS When compared with ECG of 27-Feb-2021 09:04, PREVIOUS ECG IS PRESENT Confirmed by Dene Gentry [phone]) on 03/19/2021 4:45:00 PM  Radiology DG Chest Port 1 View  Result Date: 03/19/2021 CLINICAL DATA:  Questionable sepsis - evaluate for abnormality EXAM: PORTABLE CHEST 1 VIEW COMPARISON:  Radiograph 02/26/2021, CT 02/27/2021 FINDINGS: Stable positioning of  left-sided dialysis catheter. Implanted loop recorder in the left chest wall. Unchanged cardiomegaly. Stable mediastinal contours with aortic atherosclerosis. No pulmonary edema, focal airspace disease, pleural effusion, or pneumothorax. Stable osseous structures. IMPRESSION: Unchanged cardiomegaly. No acute chest findings. Electronically Signed   By: Keith Rake M.D.   On: 03/19/2021 17:05    Procedures Procedures    Medications Ordered in ED Medications  sodium chloride 0.9 % bolus 500 mL (500 mLs Intravenous New Bag/Given 03/19/21 1705)  sodium chloride 0.9 % bolus 500 mL (500 mLs Intravenous New Bag/Given 03/19/21 1745)    ED Course/ Medical Decision Making/ A&P                           Medical Decision Making Amount and/or Complexity of Data Reviewed Labs: ordered.    Medical Screen Complete  This patient presented to the ED with complaint of nausea, vomiting, weakness.  This complaint involves an extensive number of treatment options. The initial differential diagnosis includes, but is not limited to, metabolic abnormality,etc  This presentation is: Acute, Chronic, Self-Limited, Previously Undiagnosed, Uncertain Prognosis, Complicated, Systemic Symptoms, and Threat to Life/Bodily Function  Presents with complaint of nausea, vomiting, weakness, and missed dialysis session.  Patient with hypotension present on initial triage.  Hypertension is improved with IV fluids.  Patient is noted to be hyponatremic on labs.  Patient would benefit from admission for further work-up and treatment  Family medicine teaching team is aware of case and will evaluate for admission.  Co morbidities that complicated the patient's evaluation  Advanced age, ESRD on dialysis, history of ileostomy   Additional history obtained:  Additional [facility] obtained from [facility] External records from outside sources obtained and reviewed including prior ED visits and prior Inpatient records.     Lab Tests:  I ordered and personally interpreted labs.  The pertinent results include: CBC, CMP, coags, COVID, flu, UA, lactic acid, cultures   Imaging Studies ordered:  I ordered imaging studies including chest x-ray  I independently visualized and interpreted obtained imaging which showed NAD I agree with the radiologist interpretation.   Cardiac Monitoring:  The patient was maintained on a cardiac monitor.  I personally viewed and interpreted the cardiac monitor which showed an underlying rhythm of: NSR   Medicines ordered:  I ordered medication including IV fluids for potential and Reevaluation of the patient after these medicines showed that the patient: improved    Problem List / ED Course:  Weakness, hypotension, hyponatremia, ESRD   Reevaluation:  After the interventions noted above, I reevaluated the patient and found that they have: improved  Disposition:  After consideration of the diagnostic results and the patients response to treatment, I feel that the patent would benefit from admission.          Final Clinical Impression(s) / ED Diagnoses Final diagnoses:  None    Rx / DC Orders ED Discharge Orders     None         Valarie Merino, MD 03/19/21 [phone]

## 2021-03-19 NOTE — ED Triage Notes (Signed)
Pt arrived POV from home c/o abnormal labs and blood in her ostomy. Pt is a dialysis pt T,TH,S.  Pt last had dialysis on Saturday.

## 2021-03-19 NOTE — ED Provider Triage Note (Signed)
Emergency Medicine Provider Triage Evaluation Note  [name] , a 81 y.o. female  was evaluated in triage.  Pt complains of abnormal lab work, blood in ostomy bag, nausea and vomiting.  Patient started having nausea, vomiting, and generalized abdominal pain yesterday.  Patient was noted to have blood in her ostomy bag.  Blood is described as bright red in color.  Patient is unable to quantify the amount of blood seen.  Due to blood in her ostomy bag patient was brought to [facility] emergency department yesterday however left prior to being seen by provider.  Lab work was collected.  Daughter was told to come to the emergency department due to abnormal lab work.  Lab work shows patient has leukocytosis at 18.  Patient is on dialysis, Tuesday, Thursday, and Saturday.  Last dialysis was Saturday.  Did produce a small amount of foul-smelling urine yesterday.  Review of Systems  Positive: Abdominal pain, nausea, vomiting, blood in ostomy bag, foul-smelling urine Negative: Fever, chills, cough, rhinorrhea, nasal congestion  Physical Exam  BP (!) 71/42 (BP Location: Right Arm)    Pulse 76    Temp 98.1 F (36.7 C) (Oral)    Resp 20    Ht 5\' 2"  (1.575 m)    Wt 93.4 kg    SpO2 97%    BMI 37.68 kg/m  Gen:   Awake, no distress   Resp:  Normal effort, to auscultation bilaterally MSK:   Moves extremities without difficulty  Other:  Abdomen soft, nondistended, no tenderness.  Medical Decision Making  Medically screening exam initiated at 4:26 PM.  Appropriate orders placed.  [name] was informed that the remainder of the evaluation will be completed by another provider, this initial triage assessment does not replace that evaluation, and the importance of remaining in the ED until their evaluation is complete.  Due to elevated white blood cell count will initiate sepsis work-up.  Patient noted to have blood pressure systolic in the 89F.  Charge nurse was notified and patient will be moved back to  next available room.   Loni Beckwith, [place] 03/19/21 1629

## 2021-03-20 ENCOUNTER — Encounter (HOSPITAL_COMMUNITY): Payer: Self-pay | Admitting: Family Medicine

## 2021-03-20 DIAGNOSIS — I129 Hypertensive chronic kidney disease with stage 1 through stage 4 chronic kidney disease, or unspecified chronic kidney disease: Secondary | ICD-10-CM

## 2021-03-20 DIAGNOSIS — E1143 Type 2 diabetes mellitus with diabetic autonomic (poly)neuropathy: Secondary | ICD-10-CM

## 2021-03-20 DIAGNOSIS — N189 Chronic kidney disease, unspecified: Secondary | ICD-10-CM

## 2021-03-20 DIAGNOSIS — N186 End stage renal disease: Secondary | ICD-10-CM

## 2021-03-20 DIAGNOSIS — E860 Dehydration: Secondary | ICD-10-CM | POA: Diagnosis present

## 2021-03-20 DIAGNOSIS — K3184 Gastroparesis: Secondary | ICD-10-CM

## 2021-03-20 DIAGNOSIS — D631 Anemia in chronic kidney disease: Secondary | ICD-10-CM

## 2021-03-20 DIAGNOSIS — E1122 Type 2 diabetes mellitus with diabetic chronic kidney disease: Secondary | ICD-10-CM

## 2021-03-20 DIAGNOSIS — S99929A Unspecified injury of unspecified foot, initial encounter: Secondary | ICD-10-CM

## 2021-03-20 DIAGNOSIS — R112 Nausea with vomiting, unspecified: Secondary | ICD-10-CM | POA: Diagnosis not present

## 2021-03-20 DIAGNOSIS — E871 Hypo-osmolality and hyponatremia: Secondary | ICD-10-CM

## 2021-03-20 DIAGNOSIS — F411 Generalized anxiety disorder: Secondary | ICD-10-CM | POA: Diagnosis not present

## 2021-03-20 DIAGNOSIS — I480 Paroxysmal atrial fibrillation: Secondary | ICD-10-CM

## 2021-03-20 DIAGNOSIS — K582 Mixed irritable bowel syndrome: Secondary | ICD-10-CM

## 2021-03-20 DIAGNOSIS — I5189 Other ill-defined heart diseases: Secondary | ICD-10-CM

## 2021-03-20 DIAGNOSIS — K219 Gastro-esophageal reflux disease without esophagitis: Secondary | ICD-10-CM

## 2021-03-20 DIAGNOSIS — E875 Hyperkalemia: Secondary | ICD-10-CM

## 2021-03-20 DIAGNOSIS — Z992 Dependence on renal dialysis: Secondary | ICD-10-CM

## 2021-03-20 DIAGNOSIS — E039 Hypothyroidism, unspecified: Secondary | ICD-10-CM

## 2021-03-20 DIAGNOSIS — E1140 Type 2 diabetes mellitus with diabetic neuropathy, unspecified: Secondary | ICD-10-CM

## 2021-03-20 HISTORY — DX: Other ill-defined heart diseases: I51.89

## 2021-03-20 HISTORY — DX: Mixed irritable bowel syndrome: K58.2

## 2021-03-20 HISTORY — DX: Type 2 diabetes mellitus with diabetic autonomic (poly)neuropathy: E11.43

## 2021-03-20 MED ORDER — LIDOCAINE-PRILOCAINE 2.5-2.5 % EX CREA: 1.0000 "application " | TOPICAL_CREAM | CUTANEOUS | Status: DC | PRN

## 2021-03-20 MED ORDER — SODIUM CHLORIDE 0.9 % IV SOLN: 100.0000 mL | INTRAVENOUS | Status: DC | PRN

## 2021-03-20 MED ORDER — PENTAFLUOROPROP-TETRAFLUOROETH EX AERO: 1.0000 "application " | INHALATION_SPRAY | CUTANEOUS | Status: DC | PRN

## 2021-03-20 MED ORDER — BOOST / RESOURCE BREEZE PO LIQD CUSTOM
1.0000 | Freq: Three times a day (TID) | ORAL | Status: DC
  Administered 2021-03-21 – 2021-03-22 (×3): 1 via ORAL
  Filled 2021-03-20: qty 1

## 2021-03-20 MED ORDER — PROCHLORPERAZINE MALEATE 10 MG PO TABS
10.0000 mg | ORAL_TABLET | Freq: Four times a day (QID) | ORAL | Status: DC | PRN
  Filled 2021-03-20: qty 1

## 2021-03-20 MED ORDER — CLONAZEPAM 0.5 MG PO TABS: 0.5000 mg | ORAL_TABLET | ORAL | Status: DC

## 2021-03-20 MED ORDER — CHLORHEXIDINE GLUCONATE CLOTH 2 % EX PADS
6.0000 | MEDICATED_PAD | Freq: Every day | CUTANEOUS | Status: DC
  Administered 2021-03-21: 6 via TOPICAL

## 2021-03-20 MED ORDER — SODIUM CHLORIDE 0.9 % IV BOLUS
250.0000 mL | Freq: Once | INTRAVENOUS | Status: AC
  Administered 2021-03-20: 250 mL via INTRAVENOUS

## 2021-03-20 MED ORDER — MIDODRINE HCL 5 MG PO TABS
10.0000 mg | ORAL_TABLET | ORAL | Status: DC
  Administered 2021-03-20: 10 mg via ORAL

## 2021-03-20 MED ORDER — CLONAZEPAM 0.5 MG PO TABS
0.5000 mg | ORAL_TABLET | ORAL | Status: DC
Start: 2021-03-20 — End: 2021-03-23
  Administered 2021-03-20: 0.5 mg via ORAL
  Filled 2021-03-20: qty 1

## 2021-03-20 MED ORDER — HEPARIN SODIUM (PORCINE) 1000 UNIT/ML DIALYSIS
1000.0000 [IU] | INTRAMUSCULAR | Status: DC | PRN
  Administered 2021-03-20: 1000 [IU] via INTRAVENOUS_CENTRAL

## 2021-03-20 MED ORDER — ALTEPLASE 2 MG IJ SOLR: 2.0000 mg | Freq: Once | INTRAMUSCULAR | Status: DC | PRN

## 2021-03-20 MED ORDER — LIDOCAINE HCL (PF) 1 % IJ SOLN: 5.0000 mL | INTRAMUSCULAR | Status: DC | PRN

## 2021-03-20 MED ORDER — CLONAZEPAM 0.5 MG PO TABS
1.0000 mg | ORAL_TABLET | ORAL | Status: DC
  Administered 2021-03-21: 1 mg via ORAL
  Filled 2021-03-20: qty 2

## 2021-03-20 MED ORDER — CLONAZEPAM 0.5 MG PO TABS: 0.5000 mg | ORAL_TABLET | Freq: Every day | ORAL | Status: DC | PRN

## 2021-03-20 MED ORDER — APIXABAN 5 MG PO TABS
5.0000 mg | ORAL_TABLET | Freq: Two times a day (BID) | ORAL | Status: DC
  Administered 2021-03-20 – 2021-03-21 (×3): 5 mg via ORAL
  Filled 2021-03-20 (×3): qty 1

## 2021-03-20 MED ORDER — MIDODRINE HCL 5 MG PO TABS
ORAL_TABLET | ORAL | Status: AC
  Filled 2021-03-20: qty 2

## 2021-03-20 MED ORDER — PROSOURCE PLUS PO LIQD
30.0000 mL | Freq: Two times a day (BID) | ORAL | Status: DC
Start: 2021-03-20 — End: 2021-03-23
  Administered 2021-03-21 – 2021-03-22 (×4): 30 mL via ORAL
  Filled 2021-03-20 (×6): qty 30

## 2021-03-20 NOTE — Procedures (Signed)
° °  I was present at this dialysis session, have reviewed the session itself and made  appropriate changes Kelly Splinter MD [place] pager [phone]   03/20/2021, 1:35 PM

## 2021-03-20 NOTE — Progress Notes (Signed)
FPTS Brief Progress Note  S:Patient notes that her blood pressure has been low since her HD session. She has no acute complaints but thinks that she may be able to give a urine sample but would like to be on the commode to do so.    O: BP (!) 88/48 (BP Location: Right Arm)    Pulse 68    Temp 98 F (36.7 C) (Oral)    Resp 18    Ht 5\' 2"  (1.575 m)    Wt 92.5 kg    SpO2 98%    BMI 37.30 kg/m   General: NAD, supine in bed, chronically ill appearing elderly female. Daughter at bedside Respiratory: speaking in full sentences, no respiratory distress  A/P: Hypotension Likely in the setting of recent HD, typically requires midodrine on HD session days. Has received 2 250cc boluses and the BP has remained stable, will closely monitor.  - Orders reviewed. Labs for AM ordered, which was adjusted as needed.  - If BP not improving (or if worsening), will consider either further fluids (though hesitant given tenuous fluid status) or midodrine dosing.   Rise Patience, DO 03/20/2021, 9:30 PM PGY-2, [facility] Night Resident  Please page [phone] with questions.

## 2021-03-20 NOTE — Progress Notes (Signed)
PT Cancellation Note  Patient Details Name: [name] MRN: [id number] DOB: 04-11-1940   Cancelled Treatment:    Reason Eval/Treat Not Completed: Patient at procedure or test/unavailable (HD). Will follow-up for PT Evaluation as schedule permits.  Mabeline Caras, PT, DPT Acute Rehabilitation Services  Pager [phone] Office [place] 03/20/2021, 12:40 PM

## 2021-03-20 NOTE — Progress Notes (Addendum)
NEW ADMISSION NOTE New Admission Note:   Arrival Method: E.D stretcher bed Mental Orientation: Alert and oriented x 4 Telemetry:#20 NSR Assessment: Completed Skin: DTI?Red deep discoloration on 3rd LT toe ,stage 1 on sacral area .rest of the skin intact,Assessed with Durward Mallard R.N. QJ:JHERD hand Pain: Denies Tubes: Safety Measures: Safety Fall Prevention Plan has been given, discussed and signed Admission: Completed [facility] [place] Orientation: Patient has been orientated to the room, unit and staff.  Family:[name] at the bedside.  Orders have been reviewed and implemented. Will continue to monitor the patient. Call light has been placed within reach and bed alarm has been activated.   [place], Zenon Mayo, RN
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
## 2021-03-20 NOTE — Care Management Obs Status (Incomplete)
[place] NOTIFICATION   Patient Details  Name: [name] MRN: [id number] Date of Birth: 09-11-1940   Medicare Observation Status Notification Given:  Yes    Vergie Living, RN 03/20/2021, 3:10 PM

## 2021-03-20 NOTE — Care Management CC44 (Signed)
Condition Code 44 Documentation Completed  Patient Details  Name: [name] MRN: [id number] Date of Birth: 11-Dec-1940   Condition Code 44 given:  Yes Patient signature on Condition Code 44 notice:  Yes Documentation of 2 MD's agreement:  Yes Code 44 added to claim:  Yes    Fuller Mandril, RN 03/20/2021, 3:11 PM

## 2021-03-20 NOTE — Care Management CC44 (Signed)
Condition Code 44 Documentation Completed  Patient Details  Name: [name] MRN: [id number] Date of Birth: 1940/02/11   Condition Code 44 given:  Yes Patient signature on Condition Code 44 notice:  Yes Documentation of 2 MD's agreement:    Code 44 added to claim:       Fuller Mandril, RN 03/20/2021, 2:32 PM

## 2021-03-20 NOTE — Progress Notes (Signed)
OT Cancellation Note  Patient Details Name: [name] MRN: [id number] DOB: Dec 10, 1940   Cancelled Treatment:    Reason Eval/Treat Not Completed: Medical issues which prohibited therapy (Recently returned from HD. Upon arrival, pt in trendelenburg, family at bedside, and BP 80/41. Nursing aware.)  [place], OTR/L Acute Rehab Pager: [phone] Office: [phone] 03/20/2021, 4:49 PM

## 2021-03-20 NOTE — Progress Notes (Signed)
Family Medicine Teaching Service Daily Progress Note Intern Pager: [phone]  Patient name: [name] Medical record number: [id number] Date of birth: [birth year] Age: 81 y.o. Gender: female  Primary Care Provider: [name], MD Consultants: Nephrology Code Status: FULL  Pt Overview and Major Events to Date:  03/19/21: Admitted to FPTS   Assessment and Plan:  [name] is a 81 y.o. female with presenting with fatigue, nausea and vomiting for 3 days and found to have electrolyte derangements. 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   Fatigue   Nausea, vomiting   Electrolyte Derangements  Initially found to be hypotensive, received 1L NS bolus. No evidence of tachycardia or tachypnea. Etiology unclear, leukocytosis to 16.1 and afebrile overnight. LA 3.3>2.9. Patient had a foul smelling urine yesterday, only voids every 3 weeks. UA and Ucx if able to void. Patient has a h/o gastroparesis that feels similarly to current symptoms. No nausea this AM. Will try eating.  -Monitor symptomatically -Advance diet as tolerated  -CRM -d/c phenergan  -f/u UA and ucx   ESRD on HD TTS   ACD Patient receives HD on Tuesday, Thursday, and Saturdays.  Patient has creatinine of 9.78, GFR 4.  Patient does not currently appear to be fluid overloaded,  in HD today on evaluation.  Nephrology has been consulted, following with HD.  -HD per nephrology -Avoid nephrotoxic agents -AM RFP with HD    Hypotension with HD Patient receives midodrine due to hypotension during HD. (Receives Midodrine prior to, during, and sometimes after HD). Will defer to nephrology expertise as patient was hypotensive this AM.  -Nephrology consulted, can order midodrine if needed   PAF on Eliquis  Patient is not currently in Afib.   She has not current bleeding in colostomy bag today, will restart Eliquis. Hemoglobin is stable at 9.8.  Home medication also includes amiodarone 200 mg.  -Continue home Eliquis -Continue home amiodarone 200 mg daily   S/P right hemicolectomy on colostomy bag with reported bleeding Stable without bleeding    Wound of third left toe XR benign, and there is no evidence of infection given physical exam findings. Will monitor and refer to podiatry for appropriate shoes.   Type 2 diabetes Patient does not currently take any medications to manage blood sugars.  Glucose 227, may need SSI -CBGs 3 times daily at mealtimes and nightly   Hypothyroidism -Continue home medication of Synthroid 175 mcg daily   Anxiety Home medication includes Klonopin 1 mg at bedtime on Sunday, Monday, Wednesday, and Friday and 0.5 mg at bedtime on Tuesday, Thursday, and Saturdays (dialysis days) and 0.5 once daily PRN, verified with the patient.  -Continue home regimen to avoid withdrawal    GERD -Continue home Pepcid 20 mg daily  FEN/GI: Advance as tolerated  PPx: Eliquis  Dispo: Observe for another day and likely d/c tomorrow   Subjective:  Patient notes that she feels alright. She is handling HD well. Notes that she has recurrent ulcers on the middle toe.   Objective: Temp:  [97.8 F (36.6 C)-98.1 F (36.7 C)] 97.8 F (36.6 C) (02/23 0339) Pulse Rate:  [68-77] 70 (02/23 0630) Resp:  [15-21] 18 (02/23 0630) BP: (71-118)/(42-65) 107/57 (02/23 0630) SpO2:  [96 %-100 %] 98 % (02/23 0630) Weight:  [93.4 kg] 93.4 kg (02/22 1625) Physical Exam: General: NAD,  resting in bed, watching TV during dialysis  Cardiovascular: RRR, no m/g/r Respiratory: CTAB of frontal lung fields  Abdomen: Soft, nondistended, nontender, obese  Extremities: FROM, small scab, healing area over top of middle third toe with redness but not ulceration or heat. Hammer toes present   Laboratory: Recent Labs  Lab 03/19/21 1600  03/19/21 1656  WBC 16.1*  --   HGB 9.8* 12.2  HCT 34.2* 36.0  PLT 348  --    Recent Labs  Lab 03/19/21 1656 03/19/21 1805  NA 126* 128*  K 6.3* 5.1  CL 91* 89*  CO2  --  26  BUN 63* 42*  CREATININE 11.20* 9.78*  CALCIUM  --  8.6*  PROT  --  5.6*  BILITOT  --  1.2  ALKPHOS  --  203*  ALT  --  30  AST  --  63*  GLUCOSE 199* 227*    DG Chest Port 1 View  Result Date: 03/19/2021 CLINICAL DATA:  Questionable sepsis - evaluate for abnormality EXAM: PORTABLE CHEST 1 VIEW COMPARISON:  Radiograph 02/26/2021, CT 02/27/2021 FINDINGS: Stable positioning of left-sided dialysis catheter. Implanted loop recorder in the left chest wall. Unchanged cardiomegaly. Stable mediastinal contours with aortic atherosclerosis. No pulmonary edema, focal airspace disease, pleural effusion, or pneumothorax. Stable osseous structures. IMPRESSION: Unchanged cardiomegaly. No acute chest findings. Electronically Signed   By: Keith Rake M.D.   On: 03/19/2021 17:05   DG Foot Complete Left  Result Date: 03/19/2021 CLINICAL DATA:  Left foot pain, no known injury, initial encounter EXAM: LEFT FOOT - COMPLETE 3+ VIEW COMPARISON:  None. FINDINGS: There is no evidence of fracture or dislocation. There is no evidence of arthropathy or other focal bone abnormality. Soft tissues are unremarkable. IMPRESSION: No acute abnormality noted. Electronically Signed   By: Inez Catalina M.D.   On: 03/19/2021 21:59     Erskine Emery, MD 03/20/2021, 7:00 AM PGY-1, [place] Intern pager: [phone], text pages welcome

## 2021-03-20 NOTE — Plan of Care (Signed)
  Problem: Education: Goal: Knowledge of General Education information will improve Description Including pain rating scale, medication(s)/side effects and non-pharmacologic comfort measures Outcome: Progressing   Problem: Health Behavior/Discharge Planning: Goal: Ability to manage health-related needs will improve Outcome: Progressing   

## 2021-03-20 NOTE — ED Notes (Addendum)
Called to follow up on report to 34M. RN requesting more time to review.

## 2021-03-21 ENCOUNTER — Other Ambulatory Visit (HOSPITAL_COMMUNITY): Payer: Self-pay

## 2021-03-21 DIAGNOSIS — I428 Other cardiomyopathies: Secondary | ICD-10-CM | POA: Diagnosis present

## 2021-03-21 DIAGNOSIS — E785 Hyperlipidemia, unspecified: Secondary | ICD-10-CM | POA: Diagnosis present

## 2021-03-21 DIAGNOSIS — X58XXXA Exposure to other specified factors, initial encounter: Secondary | ICD-10-CM | POA: Diagnosis present

## 2021-03-21 DIAGNOSIS — N186 End stage renal disease: Secondary | ICD-10-CM | POA: Diagnosis present

## 2021-03-21 DIAGNOSIS — I132 Hypertensive heart and chronic kidney disease with heart failure and with stage 5 chronic kidney disease, or end stage renal disease: Secondary | ICD-10-CM | POA: Diagnosis present

## 2021-03-21 DIAGNOSIS — J45909 Unspecified asthma, uncomplicated: Secondary | ICD-10-CM | POA: Diagnosis present

## 2021-03-21 DIAGNOSIS — K3184 Gastroparesis: Secondary | ICD-10-CM | POA: Diagnosis present

## 2021-03-21 DIAGNOSIS — E1143 Type 2 diabetes mellitus with diabetic autonomic (poly)neuropathy: Secondary | ICD-10-CM | POA: Diagnosis present

## 2021-03-21 DIAGNOSIS — K589 Irritable bowel syndrome without diarrhea: Secondary | ICD-10-CM | POA: Diagnosis present

## 2021-03-21 DIAGNOSIS — E1122 Type 2 diabetes mellitus with diabetic chronic kidney disease: Secondary | ICD-10-CM | POA: Diagnosis present

## 2021-03-21 DIAGNOSIS — K219 Gastro-esophageal reflux disease without esophagitis: Secondary | ICD-10-CM | POA: Diagnosis present

## 2021-03-21 DIAGNOSIS — N2581 Secondary hyperparathyroidism of renal origin: Secondary | ICD-10-CM | POA: Diagnosis present

## 2021-03-21 DIAGNOSIS — E860 Dehydration: Secondary | ICD-10-CM | POA: Diagnosis present

## 2021-03-21 DIAGNOSIS — R112 Nausea with vomiting, unspecified: Secondary | ICD-10-CM | POA: Diagnosis not present

## 2021-03-21 DIAGNOSIS — Z8616 Personal history of COVID-19: Secondary | ICD-10-CM | POA: Diagnosis not present

## 2021-03-21 DIAGNOSIS — E039 Hypothyroidism, unspecified: Secondary | ICD-10-CM | POA: Diagnosis present

## 2021-03-21 DIAGNOSIS — N189 Chronic kidney disease, unspecified: Secondary | ICD-10-CM | POA: Diagnosis not present

## 2021-03-21 DIAGNOSIS — E871 Hypo-osmolality and hyponatremia: Secondary | ICD-10-CM | POA: Diagnosis present

## 2021-03-21 DIAGNOSIS — E875 Hyperkalemia: Secondary | ICD-10-CM | POA: Diagnosis present

## 2021-03-21 DIAGNOSIS — D500 Iron deficiency anemia secondary to blood loss (chronic): Secondary | ICD-10-CM | POA: Diagnosis present

## 2021-03-21 DIAGNOSIS — Z992 Dependence on renal dialysis: Secondary | ICD-10-CM | POA: Diagnosis not present

## 2021-03-21 DIAGNOSIS — I5042 Chronic combined systolic (congestive) and diastolic (congestive) heart failure: Secondary | ICD-10-CM | POA: Diagnosis present

## 2021-03-21 DIAGNOSIS — F411 Generalized anxiety disorder: Secondary | ICD-10-CM | POA: Diagnosis present

## 2021-03-21 DIAGNOSIS — Z933 Colostomy status: Secondary | ICD-10-CM | POA: Diagnosis not present

## 2021-03-21 DIAGNOSIS — I480 Paroxysmal atrial fibrillation: Secondary | ICD-10-CM | POA: Diagnosis present

## 2021-03-21 DIAGNOSIS — I953 Hypotension of hemodialysis: Secondary | ICD-10-CM | POA: Diagnosis present

## 2021-03-21 LAB — CBC
HCT: 27.7 % — ABNORMAL LOW (ref 36.0–46.0)
Hemoglobin: 7.8 g/dL — ABNORMAL LOW (ref 12.0–15.0)
MCH: 22.8 pg — ABNORMAL LOW (ref 26.0–34.0)
MCHC: 28.2 g/dL — ABNORMAL LOW (ref 30.0–36.0)
MCV: 81 fL (ref 80.0–100.0)
Platelets: 239 10*3/uL (ref 150–400)
RBC: 3.42 MIL/uL — ABNORMAL LOW (ref 3.87–5.11)
RDW: 21 % — ABNORMAL HIGH (ref 11.5–15.5)
WBC: 6.6 10*3/uL (ref 4.0–10.5)
nRBC: 0 % (ref 0.0–0.2)

## 2021-03-21 LAB — URINALYSIS, ROUTINE W REFLEX MICROSCOPIC
Bilirubin Urine: NEGATIVE
Glucose, UA: NEGATIVE mg/dL
Ketones, ur: NEGATIVE mg/dL
Nitrite: NEGATIVE
Protein, ur: 100 mg/dL — AB
Specific Gravity, Urine: 1.013 (ref 1.005–1.030)
pH: 7 (ref 5.0–8.0)

## 2021-03-21 LAB — PREPARE RBC (CROSSMATCH)

## 2021-03-21 LAB — URINALYSIS, MICROSCOPIC (REFLEX): WBC, UA: >50 WBC/hpf (ref 0–5)

## 2021-03-21 LAB — MAGNESIUM: Magnesium: 1.9 mg/dL (ref 1.7–2.4)

## 2021-03-21 LAB — BASIC METABOLIC PANEL
Anion gap: 8 (ref 5–15)
BUN: 19 mg/dL (ref 8–23)
CO2: 25 mmol/L (ref 22–32)
Calcium: 7.9 mg/dL — ABNORMAL LOW (ref 8.9–10.3)
Chloride: 103 mmol/L (ref 98–111)
Creatinine, Ser: 5.32 mg/dL — ABNORMAL HIGH (ref 0.44–1.00)
GFR, Estimated: 8 mL/min — ABNORMAL LOW (ref >60)
Glucose, Bld: 105 mg/dL — ABNORMAL HIGH (ref 70–99)
Potassium: 3.8 mmol/L (ref 3.5–5.1)
Sodium: 136 mmol/L (ref 135–145)

## 2021-03-21 LAB — HEMOGLOBIN AND HEMATOCRIT, BLOOD
HCT: 29.9 % — ABNORMAL LOW (ref 36.0–46.0)
Hemoglobin: 8.6 g/dL — ABNORMAL LOW (ref 12.0–15.0)

## 2021-03-21 LAB — PHOSPHORUS: Phosphorus: 5 mg/dL — ABNORMAL HIGH (ref 2.5–4.6)

## 2021-03-21 MED ORDER — MIDODRINE HCL 5 MG PO TABS: 5.0000 mg | ORAL_TABLET | Freq: Three times a day (TID) | ORAL | Status: DC

## 2021-03-21 MED ORDER — CHLORHEXIDINE GLUCONATE CLOTH 2 % EX PADS
6.0000 | MEDICATED_PAD | Freq: Every day | CUTANEOUS | Status: DC
  Administered 2021-03-22: 6 via TOPICAL

## 2021-03-21 MED ORDER — SODIUM CHLORIDE 0.9% IV SOLUTION: Freq: Once | INTRAVENOUS | Status: AC

## 2021-03-21 MED ORDER — APIXABAN 2.5 MG PO TABS
2.5000 mg | ORAL_TABLET | Freq: Two times a day (BID) | ORAL | Status: DC
  Administered 2021-03-21: 2.5 mg via ORAL
  Filled 2021-03-21: qty 1

## 2021-03-21 MED ORDER — APIXABAN 2.5 MG PO TABS
2.5000 mg | ORAL_TABLET | Freq: Two times a day (BID) | ORAL | 0 refills | Status: DC
  Filled 2021-03-21: qty 60, 30d supply, fill #0

## 2021-03-21 MED ORDER — PROSOURCE PLUS PO LIQD
30.0000 mL | Freq: Two times a day (BID) | ORAL | 0 refills | Status: DC
  Filled 2021-03-21: qty 887, 15d supply, fill #0

## 2021-03-21 MED ORDER — MIDODRINE HCL 5 MG PO TABS
5.0000 mg | ORAL_TABLET | Freq: Three times a day (TID) | ORAL | Status: DC
Start: 2021-03-21 — End: 2021-03-23
  Administered 2021-03-21 – 2021-03-22 (×5): 5 mg via ORAL
  Filled 2021-03-21 (×5): qty 1

## 2021-03-21 MED ORDER — MIDODRINE HCL 5 MG PO TABS: 10.0000 mg | ORAL_TABLET | ORAL | Status: DC | PRN

## 2021-03-21 NOTE — Progress Notes (Signed)
Pt receives out-pt HD at [facility] on TTS. Pt arrives at 11:30 for 11:50 chair time. Pt is for possible d/c tomorrow per attending staff. Contacted [place] and spoke to Agricultural consultant. Pt is on the schedule for tomorrow should pt d/c in the am and able to be at clinic by 11:30 (regular time). Clinic aware pt may d/c in the am and receive treatment out-pt. Met with pt and pt's dtr at bedside. Both agreeable to out-pt HD treatment tomorrow if pt is stable for d/c in the morning. Dtr voiced concerns regarding pt's blood pressure and dizziness. Dtr feels that pt will return to hospital if there is no resolution to these issues prior to d/c. Dtr's concerns shared with charge RN per dtr's request to speak with hospital staff. Navigator also passed dtr's concerns along to attending staff via secure chat. Contacted inpt HD unit to request that pt be placed on 2nd shift in the event pt unable to d/c in the morning.   [name] Renal Navigator [phone]

## 2021-03-21 NOTE — Progress Notes (Signed)
FPTS Interim Progress Note  Patient Hgb dropped to 7.8 this morning, transfusion threshold is 8 given history of CAD. Received last HD session 2/23. Spoke to Dr. Jonnie Finner regarding this and he agrees with transfusing a unit. Ordered 1 u pRBC, will follow up on post H&H. Also discussed the recent UA results, he feels that results are not convincing to treat. Greatly appreciate recommendations from Dr. Jonnie Finner.   Donney Dice, DO 03/21/2021, 8:03 AM PGY-2, [place] Medicine Service pager [phone]

## 2021-03-21 NOTE — Progress Notes (Signed)
Family Medicine Teaching Service Daily Progress Note Intern Pager: [phone]  Patient name: [name] Medical record number: [id number] Date of birth: [birth year] Age: 81 y.o. Gender: female  Primary Care Provider: [name], MD Consultants: Nephrology  Code Status: FULL   Pt Overview and Major Events to Date:  03/19/21: Admitted to FPTS    Assessment and Plan:   [name] is a 81 y.o. female with presenting with fatigue, nausea and vomiting for 3 days and found to have electrolyte derangements. 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  Fatigue   Nausea, Vomiting   Electrolyte Derangements with ESRD  Patient continued to have soft blood pressures overnight, seems to be standard for her, as she needs Midodrine often with HD. Expect pressures to increase throughout the day today. Nausea and vomiting is being treated symptomatically and seems to be improving, likely related to her gastroparesis. UA shows large leukocytes, however, unable to assess this given ESRD history, will defer to nephrology expertise. Patient has been eating a regular diet.  -Monitor symptomatically -CRM   ESRD on HD TTS   ACD   Hypotension in HD  Received HD yesterday and required midodrine in addition to another 500 total mL bolus of fluids. Continue with current management. Hgb 7.8 with transfusion threshold of >8. Will transfuse 1uPRBCs. Symptomatic with SOB and increased fatigue.  -Midodrine per nephrology, appreciate assistance   pAF on Eliquis  Pt presents today in afib, on Eliquis with hgb of 7.8 now. No evidence of active bleeding. On recheck of Hgb>8.6 with no need for transfusion.  -Dose adjust eliquis 2.5 BID   S/p right hemicolectomy with colostomy bag with reported bleeding  No blood coming from  colostomy bag.   Wound of third left toe, stable   T2DM No current medications for control. Glucose 105.  -Monitor CBGs   Hypothyroidism  Continue home Synthroid  Anxiety  Home medication includes Klonopin 1 mg at bedtime on Sunday, Monday, Wednesday, and Friday and 0.5 mg at bedtime on Tuesday, Thursday, and Saturdays (dialysis days) and 0.5 once daily PRN, verified with the patient.  -Continue home regimen to avoid withdrawal   GERD  Continue home Pepcid 20 mg daily    FEN/GI: Regular  PPx: Eliquis 2.5 mg  Dispo: Maintain hgb and tolerated full diet before discharge.   Subjective:  Patient is eating breakfast and has been tolerating well since admission. No vomiting this AM.   Objective: Temp:  [97.8 F (36.6 C)-98.5 F (36.9 C)] 97.9 F (36.6 C) (02/24 0920) Pulse Rate:  [59-68] 63 (02/24 0920) Resp:  [14-22] 18 (02/24 0920) BP: (74-110)/(35-61) 92/52 (02/24 0920) SpO2:  [98 %-100 %] 98 % (02/24 0920) Weight:  [92.5 kg] 92.5 kg (02/23 1240) General: Alert and oriented in no apparent distress Heart: Regular rate and irregular rhythm with no murmurs appreciated Lungs: CTA bilaterally, no wheezing Abdomen: Bowel sounds present, colostomy bag in place with liquid stool, no blood present, no TTP Skin: Warm and dry Extremities: No lower extremity edema   Laboratory: Recent Labs  Lab 03/19/21 1600 03/19/21 1656 03/21/21 0440 03/21/21 1118  WBC 16.1*  --  6.6  --   HGB 9.8* 12.2 7.8* 8.6*  HCT 34.2* 36.0 27.7* 29.9*  PLT 348  --  239  --    Recent Labs  Lab  03/19/21 1656 03/19/21 1805 03/21/21 0440  NA 126* 128* 136  K 6.3* 5.1 3.8  CL 91* 89* 103  CO2  --  26 25  BUN 63* 42* 19  CREATININE 11.20* 9.78* 5.32*  CALCIUM  --  8.6* 7.9*  PROT  --  5.6*  --   BILITOT  --  1.2  --   ALKPHOS  --  203*  --   ALT  --  30  --   AST  --  63*  --   GLUCOSE 199* 227* 105*      Erskine Emery, MD 03/21/2021, 11:49 AM PGY-1, [place]  Intern pager: [phone], text pages welcome

## 2021-03-21 NOTE — Progress Notes (Addendum)
FPTS Brief Progress Note  S: Patient sleeping soundly   O: BP 117/69 (BP Location: Right Arm)    Pulse 63    Temp 98.1 F (36.7 C) (Oral)    Resp 19    Ht 5\' 2"  (1.575 m)    Wt 92.5 kg    SpO2 96%    BMI 37.30 kg/m   General: Patient sleeping, snoring Respiratory: Breathing comfortably on room air  A/P: Fatigue   anemia   nausea vomiting   electrolyte derangements with ESRD -Morning BMP, CBC -Continue to follow plan as outlined in day teams progress note  Precious Gilding, DO 03/21/2021, 10:00 PM PGY-1, [place] Family Medicine Night Resident  Please page [phone] with questions.

## 2021-03-21 NOTE — Evaluation (Signed)
Physical Therapy Evaluation Patient Details Name: [name] MRN: [id number] DOB: 02/08/1940 Today's Date: 03/21/2021  History of Present Illness  81 y.o. female presenting to ED 2/22 with fatigue, nausea and vomiting for 3 days. Found to have electrolyte derangements. 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  Clinical Impression  Patient admitted with above findings. Patient presents with +orthostatics, generalized weakness, decreased activity tolerance, and impaired balance. Patient able to stand from EOB with min guard. Obtained orthostatic vitals with patient being symptomatic reporting dizziness, lightheadedness, burning chest pain, and "lump in my throat". Unable to progress away from EOB due to symptoms and low BP. Patient currently not safe to return home with current symptoms with minimal mobility. Patient will benefit from skilled PT services during acute stay to address listed deficits. Recommend HHPT at discharge to maximize functional independence and improve activity tolerance.   Orthostatic BPs  Sitting 110/61   Standing  74/35   Sitting after 2 minutes 104/57  Standing  74/46 - complaining of burning chest pain and "lump in my throat"   Standing after 3 minutes 78/55  Sitting 116/62          Recommendations for follow up therapy are one component of a multi-disciplinary discharge planning process, led by the attending physician.  Recommendations may be updated based on patient status, additional functional criteria and insurance authorization.  Follow Up Recommendations Home health PT    Assistance Recommended at Discharge Intermittent Supervision/Assistance  Patient can return home with the following  A little help with bathing/dressing/bathroom;Assistance with cooking/housework;Help with stairs or ramp for entrance    Equipment  Recommendations None recommended by PT  Recommendations for Other Services       Functional Status Assessment Patient has had a recent decline in their functional status and demonstrates the ability to make significant improvements in function in a reasonable and predictable amount of time.     Precautions / Restrictions Precautions Precautions: Fall Restrictions Weight Bearing Restrictions: No      Mobility  Bed Mobility               General bed mobility comments: sitting EOB on arrival    Transfers Overall transfer level: Needs assistance Equipment used: Rollator (4 wheels) Transfers: Sit to/from Stand Sit to Stand: Min guard           General transfer comment: min guard for safety. Tends to pull up on RW    Ambulation/Gait                  Stairs            Wheelchair Mobility    Modified Rankin (Stroke Patients Only)       Balance Overall balance assessment: Needs assistance Sitting-balance support: No upper extremity supported, Feet supported Sitting balance-Leahy Scale: Fair     Standing balance support: Bilateral upper extremity supported, During functional activity Standing balance-Leahy Scale: Poor                               Pertinent Vitals/Pain Pain Assessment Pain Assessment: Faces Faces Pain Scale: Hurts little more Pain Location: chest Pain Descriptors / Indicators: Burning, Discomfort, Grimacing Pain Intervention(s): Monitored during session, Repositioned (Notified RN)    Home Living Family/patient expects to be discharged to:: Private residence Living Arrangements: Children Available  Help at Discharge: Family;Available 24 hours/day Type of Home: House Home Access: Ramped entrance       Home Layout: One level Home Equipment: Rollator (4 wheels);Wheelchair - manual;BSC/3in1 (adjustable bed) Additional Comments: 2-3L home O2    Prior Function Prior Level of Function : Needs assist        Physical Assist : ADLs (physical);Mobility (physical) Mobility (physical): Gait ADLs (physical): Bathing;Dressing;Toileting;IADLs Mobility Comments: Was using rollator for mobility. Last 3-4 weeks she was walking very short distances (bathroom to empty colostomy) ADLs Comments: In the last 3-4 weeks, pt has not been abel to perform ADLs and children was helping with dressing and she performs sponge bathing at EOB. Before this, pt was able to perform bathing, dressing, and simple meal prep while seated.     Hand Dominance   Dominant Hand: Right    Extremity/Trunk Assessment   Upper Extremity Assessment Upper Extremity Assessment: Defer to OT evaluation    Lower Extremity Assessment Lower Extremity Assessment: Generalized weakness    Cervical / Trunk Assessment Cervical / Trunk Assessment: Kyphotic;Other exceptions Cervical / Trunk Exceptions: inceased body habitus  Communication   Communication: HOH  Cognition Arousal/Alertness: Awake/alert Behavior During Therapy: WFL for tasks assessed/performed Overall Cognitive Status: Within Functional Limits for tasks assessed                                          General Comments General comments (skin integrity, edema, etc.): Daughter present and advocating for patient. Patient with symptomatic orthostatics reporting dizziness and lightheadedness.    Exercises     Assessment/Plan    PT Assessment Patient needs continued PT services  PT Problem List Decreased strength;Decreased balance;Decreased mobility;Decreased activity tolerance;Cardiopulmonary status limiting activity       PT Treatment Interventions DME instruction;Gait training;Functional mobility training;Therapeutic activities;Therapeutic exercise;Balance training;Neuromuscular re-education;Patient/family education    PT Goals (Current goals can be found in the Care Plan section)  Acute Rehab PT Goals Patient Stated Goal: to be able to walk and  figure out why I have no energy PT Goal Formulation: With patient/family Time For Goal Achievement: 04/04/21 Potential to Achieve Goals: Good    Frequency Min 3X/week     Co-evaluation               AM-PAC PT "6 Clicks" Mobility  Outcome Measure Help needed turning from your back to your side while in a flat bed without using bedrails?: A Little Help needed moving from lying on your back to sitting on the side of a flat bed without using bedrails?: A Little Help needed moving to and from a bed to a chair (including a wheelchair)?: A Little Help needed standing up from a chair using your arms (e.g., wheelchair or bedside chair)?: A Little Help needed to walk in hospital room?: A Little Help needed climbing 3-5 steps with a railing? : A Lot 6 Click Score: 17    End of Session Equipment Utilized During Treatment: Gait belt Activity Tolerance: Patient tolerated treatment well;Treatment limited secondary to medical complications (Comment) (+ orthostatics) Patient left: in bed;with call bell/phone within reach;with bed alarm set;with family/visitor present (sitting EOB) Nurse Communication: Mobility status;Other (comment) (orthostatics) PT Visit Diagnosis: Unsteadiness on feet (R26.81);Difficulty in walking, not elsewhere classified (R26.2)    Time: 1437-1530 PT Time Calculation (min) (ACUTE ONLY): 53 min   Charges:   PT Evaluation $PT Eval Moderate Complexity: 1 Mod  PT Treatments $Therapeutic Activity: 38-52 mins        Natassia Guthridge A. Gilford Rile PT, DPT Acute Rehabilitation Services Pager [phone] Office [phone]   Linna Hoff 03/21/2021, 3:42 PM

## 2021-03-21 NOTE — Progress Notes (Signed)
[facility] Progress Note   Subjective:   Patient seen and examined at bedside.  Reports dizziness "bed spinning" and lightheadedness.  Per OT BP dropped into 80s with standing.  On midodrine pre HD but does not take it at any other time.  Admits to increased dizziness over the last few weeks.  Hgb drop 7.8 this AM.  Daughter reports blood in colostomy bag last week but none noted in last few days.  Denies hemoptysis or hematemesis, vomiting and diarrhea.  Admits to nausea, weakness and fatigue.   Objective Vitals:   03/20/21 1829 03/20/21 2059 03/21/21 0432 03/21/21 0920  BP: (!) 87/35 (!) 88/48 (!) 94/51 (!) 92/52  Pulse: 66 68 64 63  Resp:  18 18 18   Temp:  98 F (36.7 C) 98.5 F (36.9 C) 97.9 F (36.6 C)  TempSrc:  Oral  Oral  SpO2:  98% 100% 98%  Weight:      Height:       Physical Exam General:chronically ill appearing, pale, elderly female in NAD Heart:RRR, no mrg Lungs:CTAB, nml WOB on 2L via [place] Abdomen:soft, NTND, +colostomy bag on RLQ Extremities:no LE edema Dialysis Access: [facility]   Filed Weights   03/19/21 1625 03/20/21 0850 03/20/21 1240  Weight: 93.4 kg 92.2 kg 92.5 kg    Intake/Output Summary (Last 24 hours) at 03/21/2021 1158 Last data filed at 03/21/2021 1000 Gross per 24 hour  Intake 400 ml  Output 700 ml  Net -300 ml    Additional Objective Labs: Basic Metabolic Panel: Recent Labs  Lab 03/19/21 1656 03/19/21 1805 03/21/21 0440  NA 126* 128* 136  K 6.3* 5.1 3.8  CL 91* 89* 103  CO2  --  26 25  GLUCOSE 199* 227* 105*  BUN 63* 42* 19  CREATININE 11.20* 9.78* 5.32*  CALCIUM  --  8.6* 7.9*   Liver Function Tests: Recent Labs  Lab 03/19/21 1805  AST 63*  ALT 30  ALKPHOS 203*  BILITOT 1.2  PROT 5.6*  ALBUMIN 2.4*   CBC: Recent Labs  Lab 03/19/21 1600 03/19/21 1656 03/21/21 0440 03/21/21 1118  WBC 16.1*  --  6.6  --   NEUTROABS 13.2*  --   --   --   HGB 9.8* 12.2 7.8* 8.6*  HCT 34.2* 36.0 27.7* 29.9*  MCV 80.1  --   81.0  --   PLT 348  --  239  --    Blood Culture    Component Value Date/Time   SDES BLOOD RIGHT WRIST 03/19/2021 1642   SPECREQUEST  03/19/2021 1642    BOTTLES DRAWN AEROBIC AND ANAEROBIC Blood Culture adequate volume   CULT  03/19/2021 1642    NO GROWTH 2 DAYS Performed at [facility], [place] [address] [place], [place] [address]    REPTSTATUS PENDING 03/19/2021 1642    Studies/Results: DG Chest Port 1 View  Result Date: 03/19/2021 CLINICAL DATA:  Questionable sepsis - evaluate for abnormality EXAM: PORTABLE CHEST 1 VIEW COMPARISON:  Radiograph 02/26/2021, CT 02/27/2021 FINDINGS: Stable positioning of left-sided dialysis catheter. Implanted loop recorder in the left chest wall. Unchanged cardiomegaly. Stable mediastinal contours with aortic atherosclerosis. No pulmonary edema, focal airspace disease, pleural effusion, or pneumothorax. Stable osseous structures. IMPRESSION: Unchanged cardiomegaly. No acute chest findings. Electronically Signed   By: Keith Rake M.D.   On: 03/19/2021 17:05   DG Foot Complete Left  Result Date: 03/19/2021 CLINICAL DATA:  Left foot pain, no known injury, initial encounter EXAM: LEFT FOOT -  COMPLETE 3+ VIEW COMPARISON:  None. FINDINGS: There is no evidence of fracture or dislocation. There is no evidence of arthropathy or other focal bone abnormality. Soft tissues are unremarkable. IMPRESSION: No acute abnormality noted. Electronically Signed   By: Inez Catalina M.D.   On: 03/19/2021 21:59    Medications:   (feeding supplement) PROSource Plus  30 mL Oral BID BM   amiodarone  200 mg Oral q AM   apixaban  2.5 mg Oral BID   Chlorhexidine Gluconate Cloth  6 each Topical Q0600   clonazePAM  0.5 mg Oral Once per day on Tue Thu Sat   clonazePAM  1 mg Oral Once per day on Sun Mon Wed Fri   famotidine  20 mg Oral q AM   feeding supplement  1 Container Oral TID BM   fluticasone  1 spray Each Nare Daily   levothyroxine  175 mcg Oral QAC breakfast    loratadine  10 mg Oral Daily   midodrine  5 mg Oral TID WC   multivitamin  1 tablet Oral QHS   vitamin B-12  500 mcg Oral Daily    Dialysis Orders: TTS at [facility] 3:30hr, 400/A1.5, EDW 93kg, 2K/2Ca, TDC, no heparin - Mircera 258mcg IV q 2 weeks (last given 1/19?) - Calcitriol 15mcg PO q HD   Assessment/Plan:  Fatigue/nausea/vomiting: Leukocytosis on admit, now resolved. CXR clear. No vomiting today. Urine culture pending.   ESRD:  Continue HD per usual TTS schedule - HD tomorrow.  Hyperkalemia: Resolved.  K 3.8 today.   Hypotension/volume: Chronic hypotension - uses midodrine during HD. Symptomatic orthostatic hypotension noted by OT.  Will start midodrine 5mg  TID. If blood pressure improve can d/c. Continue 10mg  pre HD.  No edema on exam and CXR clear, minimal UF as tolerated.   Acute on chronic Anemia: symptomatic. Hgb drop to 7.8, 1 unit pRBC ordered initially then cancelled when repeat Hgb 8.6. highly variable values since admission.  Reports blood in ostomy bag recently.  May need GI consult if Hgb dropping or recurrent bleeding.  Recent admit for GIB w/bleeding AVM noted on EGD s/p coagulation. Monitor. Order Aranesp with HD tomorrow, last ESA given during last admission.  Metabolic bone disease: Ca ok, check phos. Continue VDRA and binders.   Nutrition:  Alb low, will add supplements.  Renal diet w/fluid restrictions.   A-fib: On amiodarone + Eliquis.  Jen Mow, PA-C [facility] 03/21/2021,11:58 AM  LOS: 1 day

## 2021-03-21 NOTE — TOC Initial Note (Signed)
Transition of Care [facility]) - Initial/Assessment Note    Patient Details  Name: [name] MRN: [id number] Date of Birth: [birth year]  Transition of Care [facility]) CM/SW Contact:    Tom-Johnson, Renea Ee, RN Phone Number: 03/21/2021, 2:14 PM  Clinical Narrative:                  CM spoke with patient and daughter, [name] at bedside about needs for post hospital transition. Recently discharged from the hospital. Admitted for Nausea/vomiting/ Fatigue. Hgb initially was 7.8, rechecked and now 8.6. No blood transfusion given. From home with son [name] and daughter, [name] lives close by and very supportive with care. Has all necessary DME's at home. On home oxygen and supplies from [employer]. Home health PT/OT recommended. Patient was active with [facility] in 2020. Patient and [name] request to continue with their services. CM contacted [name] with [facility] and he voiced acceptance. Information on AVS. Marsha states patient's PCP had referred palliative at home with [facility]. States someone will schedule first home after patient is discharged. CM will continue to follow with needs.  Expected Discharge Plan: [place] Barriers to Discharge: Continued Medical Work up   Patient Goals and CMS Choice Patient states their goals for this hospitalization and ongoing recovery are:: To return home CMS Medicare.gov Compare Post Acute Care list provided to:: Patient Choice offered to / list presented to : Patient, Adult Children (Daughter, [name])  Expected Discharge Plan and Services Expected Discharge Plan: [place]   Discharge Planning Services: CM Consult Post Acute Care Choice: [place] arrangements for the past 2 months: Single Family Home                   DME Agency: NA       HH Arranged: PT, OT HH Agency: [place] Date [facility] Agency Contacted: 03/21/21 Time HH Agency Contacted: 41 Representative spoke with at [place]: [name]  Prior Living Arrangements/Services Living arrangements for the past 2 months: [place] Lives with:: Adult Children Patient language and need for interpreter reviewed:: Yes Do you feel safe going back to the place where you live?: Yes      Need for Family Participation in Patient Care: Yes (Comment) Care giver support system in place?: Yes (comment) Current home services: DME Criminal Activity/Legal Involvement Pertinent to Current Situation/Hospitalization: No - Comment as needed  Activities of Daily Living Home Assistive Devices/Equipment: Walker (specify type) ADL Screening (condition at time of admission) Patient's cognitive ability adequate to safely complete daily activities?: No Is the patient deaf or have difficulty hearing?: No Does the patient have difficulty seeing, even when wearing glasses/contacts?: Yes Does the patient have difficulty concentrating, remembering, or making decisions?: No Patient able to express need for assistance with ADLs?: Yes Does the patient have difficulty dressing or bathing?: Yes Independently performs ADLs?: No Communication: Needs assistance Is this a change from baseline?: Pre-admission baseline Dressing (OT): Needs assistance Is this a change from baseline?: Pre-admission baseline Grooming: Needs assistance Is this a change from baseline?: Pre-admission baseline Feeding: Dependent, Independent Is this a change from baseline?: Pre-admission baseline Bathing: Needs assistance Toileting: Needs assistance Is this a change from baseline?: Pre-admission baseline In/Out Bed: Needs assistance Is this a change from baseline?: Pre-admission baseline Walks in Home: Needs assistance Is this a change from baseline?: Pre-admission baseline Does the patient have difficulty walking or climbing stairs?: Yes Weakness of Legs: Both Weakness of  Arms/Hands: None  Permission Sought/Granted Permission sought to share information  with : Case Manager, Customer service manager, Family Supports Permission granted to share information with : Yes, Verbal Permission Granted  Share Information with NAME: [name]  Permission granted to share info w AGENCY: [facility]        Emotional Assessment Appearance:: Appears stated age Attitude/Demeanor/Rapport: Engaged, Gracious Affect (typically observed): Accepting, Appropriate, Calm, Hopeful Orientation: : Oriented to Self, Oriented to Place, Oriented to  Time, Oriented to Situation Alcohol / Substance Use: Not Applicable Psych Involvement: No (comment)  Admission diagnosis:  Hyperkalemia [E87.5] Hyponatremia [E87.1] Weakness [R53.1] Toe injury [S99.929A] Nausea and vomiting, unspecified vomiting type [R11.2] Patient Active Problem List   Diagnosis Date Noted   Grade II diastolic dysfunction 62/22/9798   Gastroparesis 03/20/2021   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   Dehydration 03/20/2021   Hyponatremia    Toe injury    Hyperkalemia 03/19/2021   Nausea & vomiting 02/27/2021   AVM (arteriovenous malformation) of small bowel, acquired with hemorrhage    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   Pressure injury of skin 02/16/2021   Blood loss anemia 02/15/2021   Bacteremia 07/01/2020   Nausea without vomiting    Coagulation defect, unspecified ([place]) 05/23/2019   ESRD on dialysis ([place]) 04/10/2019   Chronic kidney disease (CKD), active medical management without dialysis, stage 5 ([place])    Diabetes mellitus with renal manifestation ([place]) 05/30/2015   Hypertensive heart disease    A-fib ([place]) 04/16/2014   Arterial hypotension    CAD (coronary artery disease) 01/11/2013   Hyperlipidemia 01/11/2013   Hypertension associated with chronic kidney disease due to type 2 diabetes mellitus ([place])    DM neuropathy, type II diabetes mellitus ([place]) 01/09/2013   Obesity, Class III, BMI 40-49.9 (morbid obesity) ([place]) 03/05/2010   Hypothyroidism 02/07/2009    Anxiety state 02/07/2009   Asthma 02/07/2009   GASTROESOPHAGEAL REFLUX DISEASE, CHRONIC 02/07/2009   Osteoarthrosis, unspecified whether generalized or localized, unspecified site 02/07/2009   PCP:  [name], MD Pharmacy:   [facility] [place] [address] Phone: [phone] Fax: [phone]  [facility] [address] [address] Phone: [phone] Fax: [phone]     Social Determinants of Health (SDOH) Interventions    Readmission Risk Interventions No flowsheet data found.

## 2021-03-21 NOTE — Discharge Instructions (Addendum)
Dear [name],   Thank you so much for allowing [place] to be part of your care!  You were admitted to [facility] for nausea and vomiting as well as chronic anemia. We treated you symptomatically and you improved greatly. You maintained your blood pressure and blood levels as well. Midodrine was increased in dosage by your kidney doctors. We will likely send you to a digestive health specialist as well.    POST-HOSPITAL & CARE INSTRUCTIONS Follow up with your cardiologist next week Follow up with PCP as well for hospital follow up  Please let PCP/Specialists know of any changes that were made.  Please see medications section of this packet for any medication changes.   DOCTOR'S APPOINTMENT & FOLLOW UP CARE INSTRUCTIONS  Future Appointments  Date Time Provider [place]  03/24/2021  2:40 PM [name], Peter M, MD [facility]    RETURN PRECAUTIONS: Return if you experience shortness of breath, chest pain, swelling in the extremities, blood in vomit or colostomy bag.   Take care and be well!  [facility]  [place], [place] [address] [phone]

## 2021-03-21 NOTE — Progress Notes (Signed)
Occupational Therapy Evaluation Patient Details Name: [name] MRN: [id number] DOB: 1940/10/17 Today's Date: 03/21/2021   History of Present Illness 81 y.o. female presenting to ED 2/22 with fatigue, nauseam abd vomiting for 3 days. Found to have electrolyte derangements. 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   Clinical Impression   PTA, pt was living with her son and was recent requiring assistance for BADLs and short distance mobility with rollator. Prior to 3-4 weeks ago, pt was performing ADLs, light IADLs, and able to perform community mobility with rollator. Upon arrival, pt sitting at EOB having finished her breakfast; daughter at bedside. Pt currently requiring Mod A for UB ADLs, Max A for LB ADLs, and Min A for sit<>stand with RW. Pt taking side steps towards HOB, however, reporting dizziness with sitting and standing. Pt would benefit from further acute OT to facilitate safe dc. Pending progress, recommend dc to home with HHOT for further OT to optimize safety, independence with ADLs, and return to PLOF.    Blood pressures:   Sitting at EOB 92/52  Sitting at EOB after BLE exercises 97/52  Seated at EOB after sit<>stand 80/52  Supine 103/49     Recommendations for follow up therapy are one component of a multi-disciplinary discharge planning process, led by the attending physician.  Recommendations may be updated based on patient status, additional functional criteria and insurance authorization.   Follow Up Recommendations  Home health OT    Assistance Recommended at Discharge Frequent or constant Supervision/Assistance  Patient can return home with the following A little help with bathing/dressing/bathroom    Functional Status Assessment  Patient has had a recent decline in their functional status and demonstrates the ability to make  significant improvements in function in a reasonable and predictable amount of time.  Equipment Recommendations  None recommended by OT    Recommendations for Other Services       Precautions / Restrictions Precautions Precautions: Fall      Mobility Bed Mobility Overal bed mobility: Needs Assistance Bed Mobility: Sit to Sidelying, Rolling Rolling: Min guard       Sit to sidelying: Min assist General bed mobility comments: Min A to bring BLEs over EOB. Pt able to initate but able to achieve up and over edge due to weakness    Transfers Overall transfer level: Needs assistance Equipment used: Rolling walker (2 wheels) Transfers: Sit to/from Stand Sit to Stand: Min assist           General transfer comment: Min A for power up. Pt with preference to pull up on walker      Balance Overall balance assessment: Needs assistance Sitting-balance support: No upper extremity supported, Feet supported Sitting balance-Leahy Scale: Fair     Standing balance support: Bilateral upper extremity supported, During functional activity Standing balance-Leahy Scale: Poor                             ADL either performed or assessed with clinical judgement   ADL Overall ADL's : Needs assistance/impaired Eating/Feeding: Set up;Sitting Eating/Feeding Details (indicate cue type and reason): upon arrival, pt sitting at EOB having finished her breakfast Grooming: Set up;Sitting   Upper Body Bathing: Moderate assistance;Sitting   Lower Body Bathing: Maximal assistance;Sit to/from stand   Upper Body Dressing : Moderate assistance;Sitting   Lower Body  Dressing: Maximal assistance;Sit to/from stand   Toilet Transfer: Minimal assistance Toilet Transfer Details (indicate cue type and reason): Pt able to perform side steps towards HOB. However, standing tolerance limited by BP         Functional mobility during ADLs: Minimal assistance;Rolling walker (2 wheels) General  ADL Comments: pt presenting with decreased activity tolerance due to fatigue and soft BP. Notified RN     Vision Baseline Vision/History: 1 Wears glasses Vision Assessment?: No apparent visual deficits Additional Comments: Seeing black spots with dizziness     Perception     Praxis      Pertinent Vitals/Pain Pain Assessment Pain Assessment: Faces Faces Pain Scale: Hurts little more Pain Location: "just my joints" Pain Descriptors / Indicators: Constant, Discomfort Pain Intervention(s): Monitored during session, Repositioned     Hand Dominance Right   Extremity/Trunk Assessment Upper Extremity Assessment Upper Extremity Assessment: Overall WFL for tasks assessed   Lower Extremity Assessment Lower Extremity Assessment: Defer to PT evaluation   Cervical / Trunk Assessment Cervical / Trunk Assessment: Kyphotic;Other exceptions Cervical / Trunk Exceptions: inceased body habitus   Communication Communication Communication: HOH   Cognition Arousal/Alertness: Awake/alert Behavior During Therapy: WFL for tasks assessed/performed Overall Cognitive Status: Within Functional Limits for tasks assessed                                 General Comments: Requiring increased time for following commands due to [facility] and dizziness     General Comments  Daughter present throughout. SpO2 99% on 2L. Pt reporting dizziness throughout session and symptomic. BP sitting at EOB 92/52, sitting after BLE exercises 97/52, sitting after standing 80/52, and Supine 103/49    Exercises Exercises: General Lower Extremity General Exercises - Lower Extremity Ankle Circles/Pumps: AROM, Both, 20 reps, Seated Long Arc Quad: AROM, 20 reps, Seated   Shoulder Instructions      Home Living Family/patient expects to be discharged to:: Private residence Living Arrangements: Children Available Help at Discharge: Family;Available 24 hours/day Type of Home: House Home Access: Ramped entrance      Home Layout: One level     Bathroom Shower/Tub: Occupational psychologist: Handicapped height     Home Equipment: Rollator (4 wheels);Wheelchair - manual;BSC/3in1 (adjustable bed)   Additional Comments: 2-3L home O2      Prior Functioning/Environment Prior Level of Function : Needs assist       Physical Assist : ADLs (physical);Mobility (physical) Mobility (physical): Gait ADLs (physical): Bathing;Dressing;Toileting;IADLs Mobility Comments: Was using rollator for mobility. Last 3-4 weeks she was walking very short distances (bathroom to empty colostomy) ADLs Comments: In the last 3-4 weeks, pt has not been abel to perform ADLs and children was helping with dressing and she performs sponge bathing at EOB. Before this, pt was able to perform bathing, dressing, and simple meal prep while seated.        OT Problem List: Decreased activity tolerance;Decreased strength;Decreased range of motion;Impaired balance (sitting and/or standing);Decreased knowledge of use of DME or AE;Decreased knowledge of precautions      OT Treatment/Interventions: Self-care/ADL training;Therapeutic exercise;Energy conservation;DME and/or AE instruction    OT Goals(Current goals can be found in the care plan section) Acute Rehab OT Goals OT Goal Formulation: With patient/family Time For Goal Achievement: 04/04/21 Potential to Achieve Goals: Good  OT Frequency: Min 2X/week    Co-evaluation  AM-PAC OT "6 Clicks" Daily Activity     Outcome Measure Help from another person eating meals?: None Help from another person taking care of personal grooming?: A Little Help from another person toileting, which includes using toliet, bedpan, or urinal?: A Little Help from another person bathing (including washing, rinsing, drying)?: A Lot Help from another person to put on and taking off regular upper body clothing?: A Lot Help from another person to put on and taking off regular  lower body clothing?: A Lot 6 Click Score: 16   End of Session Equipment Utilized During Treatment: Rolling walker (2 wheels);Oxygen Nurse Communication: Mobility status  Activity Tolerance: Patient tolerated treatment well Patient left: in bed;with call bell/phone within reach;with bed alarm set;with family/visitor present  OT Visit Diagnosis: Unsteadiness on feet (R26.81);Other abnormalities of gait and mobility (R26.89);Muscle weakness (generalized) (M62.81)                Time: 9147-8295 OT Time Calculation (min): 19 min Charges:  OT General Charges $OT Visit: 1 Visit OT Evaluation $OT Eval Moderate Complexity: [place], OTR/L Acute Rehab Pager: [phone] Office: [place] 03/21/2021, 10:05 AM

## 2021-03-22 DIAGNOSIS — I9589 Other hypotension: Secondary | ICD-10-CM

## 2021-03-22 DIAGNOSIS — I480 Paroxysmal atrial fibrillation: Secondary | ICD-10-CM | POA: Diagnosis not present

## 2021-03-22 DIAGNOSIS — K3184 Gastroparesis: Secondary | ICD-10-CM | POA: Diagnosis not present

## 2021-03-22 DIAGNOSIS — N189 Chronic kidney disease, unspecified: Secondary | ICD-10-CM | POA: Diagnosis not present

## 2021-03-22 DIAGNOSIS — R112 Nausea with vomiting, unspecified: Secondary | ICD-10-CM | POA: Diagnosis not present

## 2021-03-22 LAB — BASIC METABOLIC PANEL
Anion gap: 11 (ref 5–15)
BUN: 31 mg/dL — ABNORMAL HIGH (ref 8–23)
CO2: 23 mmol/L (ref 22–32)
Calcium: 8.6 mg/dL — ABNORMAL LOW (ref 8.9–10.3)
Chloride: 100 mmol/L (ref 98–111)
Creatinine, Ser: 7.11 mg/dL — ABNORMAL HIGH (ref 0.44–1.00)
GFR, Estimated: 5 mL/min — ABNORMAL LOW (ref >60)
Glucose, Bld: 157 mg/dL — ABNORMAL HIGH (ref 70–99)
Potassium: 3.8 mmol/L (ref 3.5–5.1)
Sodium: 134 mmol/L — ABNORMAL LOW (ref 135–145)

## 2021-03-22 LAB — HEPATITIS B SURFACE ANTIGEN: Hepatitis B Surface Ag: NONREACTIVE

## 2021-03-22 LAB — CBC
HCT: 28.7 % — ABNORMAL LOW (ref 36.0–46.0)
Hemoglobin: 8.1 g/dL — ABNORMAL LOW (ref 12.0–15.0)
MCH: 22.8 pg — ABNORMAL LOW (ref 26.0–34.0)
MCHC: 28.2 g/dL — ABNORMAL LOW (ref 30.0–36.0)
MCV: 80.6 fL (ref 80.0–100.0)
Platelets: 274 10*3/uL (ref 150–400)
RBC: 3.56 MIL/uL — ABNORMAL LOW (ref 3.87–5.11)
RDW: 20.9 % — ABNORMAL HIGH (ref 11.5–15.5)
WBC: 7.2 10*3/uL (ref 4.0–10.5)
nRBC: 0 % (ref 0.0–0.2)

## 2021-03-22 LAB — MAGNESIUM: Magnesium: 2 mg/dL (ref 1.7–2.4)

## 2021-03-22 MED ORDER — ALTEPLASE 2 MG IJ SOLR: 2.0000 mg | Freq: Once | INTRAMUSCULAR | Status: DC | PRN

## 2021-03-22 MED ORDER — HEPARIN SODIUM (PORCINE) 1000 UNIT/ML DIALYSIS
1000.0000 [IU] | INTRAMUSCULAR | Status: DC | PRN
  Filled 2021-03-22: qty 1

## 2021-03-22 MED ORDER — SODIUM CHLORIDE 0.9 % IV SOLN: 100.0000 mL | INTRAVENOUS | Status: DC | PRN

## 2021-03-22 MED ORDER — LIDOCAINE HCL (PF) 1 % IJ SOLN: 5.0000 mL | INTRAMUSCULAR | Status: DC | PRN

## 2021-03-22 MED ORDER — LIDOCAINE-PRILOCAINE 2.5-2.5 % EX CREA: 1.0000 "application " | TOPICAL_CREAM | CUTANEOUS | Status: DC | PRN

## 2021-03-22 MED ORDER — PENTAFLUOROPROP-TETRAFLUOROETH EX AERO: 1.0000 "application " | INHALATION_SPRAY | CUTANEOUS | Status: DC | PRN

## 2021-03-22 NOTE — Progress Notes (Signed)
Family Medicine Teaching Service Daily Progress Note Intern Pager: [phone]  Patient name: [name] Medical record number: [id number] Date of birth: 08-Nov-1940 Age: 81 y.o. Gender: female  Primary Care Provider: [name], MD Consultants: Nephro Code Status: Full  Pt Overview and Major Events to Date:  03/19/21: Admitted to FPTS    Assessment and Plan:   CORNELLA [name] is a 81 y.o. female with presenting with fatigue, nausea and vomiting for 3 days and found to have electrolyte derangements. 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  Fatigue  nausea vomiting  electrolyte derangements with ESRD Patient continues to tolerate a regular diet without presence of hematemesis or hematochezia.  Treated appropriately with symptomatic treatment.  Patient and daughter would like to see nutrition prior to discharge.  Will advise PCP Dr. Adah Salvage to refer them to digestive health with [facility].  ESRD on HD TTS  hypotension and HD Patient was seen by nephrology, and they decided to increase midodrine dosage given multiple episodes of hypotension in an outside of HD.  PAF on Eliquis Dose adjusted Eliquis 2.5 mg twice daily  Goals of care Discussed with the patient that this is likely chronic due to her gastroparesis, and given her age, with multiple comorbidities, we have to weigh risk and benefit of every procedure.  I expressed that it was a good thing that her hemoglobin stayed above transfusion threshold.  We discussed that fluid status is very important in patients with ESRD and difficult to manage as she can become fluid overloaded quite quickly but needs fluids to maintain a blood pressure.  Otherwise chronic conditions stable at this time.  FEN/GI: Regular  Dispo: Home today after nutrition and  HD.     Subjective:  Pt is having a difficult time understanding why more is not being down for her and expresses frustration at lack of communication.   Objective: Temp:  [97.5 F (36.4 C)-98.1 F (36.7 C)] 97.5 F (36.4 C) (02/25 1020) Pulse Rate:  [54-65] 54 (02/25 1020) Resp:  [16-19] 16 (02/25 1020) BP: (106-117)/(53-69) 108/62 (02/25 1020) SpO2:  [96 %-100 %] 99 % (02/25 1020) General: Alert and oriented in no apparent distress, sitting at bedside with daughter  Heart: Regular rate and regular rhythm with no murmurs appreciated Lungs: CTA bilaterally, no wheezing Abdomen: Bowel sounds present, colostomy bag in place with liquid stool, no blood present, no TTP Skin: Warm and dry Extremities: No lower extremity edema  Laboratory: Recent Labs  Lab 03/19/21 1600 03/19/21 1656 03/21/21 0440 03/21/21 1118 03/22/21 0220  WBC 16.1*  --  6.6  --  7.2  HGB 9.8*   < > 7.8* 8.6* 8.1*  HCT 34.2*   < > 27.7* 29.9* 28.7*  PLT 348  --  239  --  274   < > = values in this interval not displayed.   Recent Labs  Lab 03/19/21 1805 03/21/21 0440 03/22/21 0220  NA 128* 136 134*  K 5.1 3.8 3.8  CL 89* 103 100  CO2 26 25 23   BUN 42* 19 31*  CREATININE 9.78* 5.32* 7.11*  CALCIUM 8.6* 7.9* 8.6*  PROT 5.6*  --   --   BILITOT 1.2  --   --   ALKPHOS 203*  --   --   ALT 30  --   --  AST 63*  --   --   GLUCOSE 227* [place]      Erskine Emery, MD 03/22/2021, 1:19 PM PGY-1, [place] Intern pager: [phone], text pages welcome

## 2021-03-22 NOTE — Progress Notes (Signed)
[facility] Progress Note   Subjective:   Patient seen and examined at bedside.  Feeling a little better today while sitting.  Continues to have dizziness if she moves around too fast.  Has not stood up yet to see if improved with standing.  Reports vomiting/nausea last night after eating pot roast.  Daughter requesting to speak with RD for additional education about diet. Denies CP, SOB, abdominal pain and current n/v/d.    Objective Vitals:   03/21/21 2117 03/22/21 0026 03/22/21 0442 03/22/21 1020  BP: 117/69 (!) 112/59 (!) 108/54 108/62  Pulse: 63 61 65 (!) 54  Resp: 19 18 17 16   Temp: 98.1 F (36.7 C) (!) 97.5 F (36.4 C) 97.8 F (36.6 C) (!) 97.5 F (36.4 C)  TempSrc: Oral Oral Oral Oral  SpO2: 96% 99% 98% 99%  Weight:      Height:       Physical Exam General:chronically ill appearing, elderly female in NAD Heart:RRR, no mrg Lungs:CTAB, nml WOB on RA Abdomen:soft, NTND, +ostomy in RLQ Extremities:no LE edema Dialysis Access: [facility]   Filed Weights   03/19/21 1625 03/20/21 0850 03/20/21 1240  Weight: 93.4 kg 92.2 kg 92.5 kg    Intake/Output Summary (Last 24 hours) at 03/22/2021 1036 Last data filed at 03/22/2021 0443 Gross per 24 hour  Intake 360 ml  Output 475 ml  Net -115 ml    Additional Objective Labs: Basic Metabolic Panel: Recent Labs  Lab 03/19/21 1805 03/21/21 0440 03/21/21 1118 03/22/21 0220  NA 128* 136  --  134*  K 5.1 3.8  --  3.8  CL 89* 103  --  100  CO2 26 25  --  23  GLUCOSE 227* 105*  --  157*  BUN 42* 19  --  31*  CREATININE 9.78* 5.32*  --  7.11*  CALCIUM 8.6* 7.9*  --  8.6*  PHOS  --   --  5.0*  --    Liver Function Tests: Recent Labs  Lab 03/19/21 1805  AST 63*  ALT 30  ALKPHOS 203*  BILITOT 1.2  PROT 5.6*  ALBUMIN 2.4*   CBC: Recent Labs  Lab 03/19/21 1600 03/19/21 1656 03/21/21 0440 03/21/21 1118 03/22/21 0220  WBC 16.1*  --  6.6  --  7.2  NEUTROABS 13.2*  --   --   --   --   HGB 9.8*   < > 7.8*  8.6* 8.1*  HCT 34.2*   < > 27.7* 29.9* 28.7*  MCV 80.1  --  81.0  --  80.6  PLT 348  --  239  --  274   < > = values in this interval not displayed.   Blood Culture    Component Value Date/Time   SDES BLOOD RIGHT WRIST 03/19/2021 1642   SPECREQUEST  03/19/2021 1642    BOTTLES DRAWN AEROBIC AND ANAEROBIC Blood Culture adequate volume   CULT  03/19/2021 1642    NO GROWTH 3 DAYS Performed at [facility], [place] [address] [place], [place] [address]    REPTSTATUS PENDING 03/19/2021 1642   Medications:   (feeding supplement) PROSource Plus  30 mL Oral BID BM   amiodarone  200 mg Oral q AM   apixaban  2.5 mg Oral BID   Chlorhexidine Gluconate Cloth  6 each Topical Q0600   clonazePAM  0.5 mg Oral Once per day on Tue Thu Sat   clonazePAM  1 mg Oral Once per day on Sun Mon  Wed Fri   famotidine  20 mg Oral q AM   feeding supplement  1 Container Oral TID BM   fluticasone  1 spray Each Nare Daily   levothyroxine  175 mcg Oral QAC breakfast   loratadine  10 mg Oral Daily   midodrine  5 mg Oral TID WC   multivitamin  1 tablet Oral QHS   vitamin B-12  500 mcg Oral Daily    Dialysis Orders: TTS at [facility] 3:30hr, 400/A1.5, EDW 93kg, 2K/2Ca, TDC, no heparin - Mircera 233mcg IV q 2 weeks (last given 1/19?) - Calcitriol 54mcg PO q HD   Assessment/Plan:  Fatigue/nausea/vomiting: Leukocytosis on admit, now resolved. CXR clear. Vomiting last night with certain foods.  None this AM. Continue to have fatigue. PT/OT assessing.  RD to provide additional education about diet.   ESRD:  Continue HD per usual TTS schedule - HD today  Hyperkalemia: Resolved.  K 3.8 today.   Hypotension/volume: Chronic hypotension - uses midodrine during HD. Symptomatic orthostatic hypotension noted by OT yesterday. Midodrine 5mg  TID started 2/24, may need to increase to 10mg  TID if no improvement in orthostatics.  Continue 10mg  pre HD.  No edema on exam and CXR clear, minimal UF as tolerated.   Acute on chronic  Anemia: symptomatic. Hgb drop to 7.8, 1 unit pRBC ordered initially then cancelled when repeat Hgb 8.6. Hgb 8.1 this AM.  Highly variable values since admission.  Reports blood in ostomy bag last week.  May need GI consult if Hgb dropping or recurrent bleeding.  Recent admit for GIB w/bleeding AVM noted on EGD s/p coagulation. Monitor. Aranesp ordered with HD today.   Metabolic bone disease: Ca and phos in goal. Continue VDRA and binders.   Nutrition:  Alb low, will add supplements.  Renal diet w/fluid restrictions.   A-fib: On amiodarone + Eliquis  Jen Mow, PA-C [facility] 03/22/2021,10:36 AM  LOS: 3 days

## 2021-03-22 NOTE — Progress Notes (Signed)
I went to see pt this evening however she was in HD. Her daughter and son in law were in the room. They live an hour away and willing to take her home after HD. HD is not due to finish until 10:30pm. I explained I am happy for her to be discharged today as long as she feels well post HD and she has someone staying with her tonight. They will take her to her home where she lives with her brother.  I will return to see her after HD.   Lattie Haw MD PGY-3, [place]

## 2021-03-22 NOTE — Progress Notes (Signed)
FPTS Interim Progress Note  Paged regarding patient wanting to leave early. Went to bedside with Dr. Zigmund Daniel, patient's daughter also present at bedside. I explained that after verifying with HD, today's session is scheduled for 6pm. [name] wanting to leave earlier before HD session, recommended to stay given that patient is due for dialysis today and we don't want her to get behind. Daughter expresses her displeasure for this, I explained that they wanted further monitoring after not wanting to discharge home yesterday therefore scheduled inpatient session. I explained that we have completed the workup and will have PT work with Ms. Pembleton at home. Patient wanting a short session which I said they can discuss with nephrology. They agree to stay until scheduled HD session. Will plan for discharge tomorrow.   Donney Dice, DO 03/22/2021, 4:37 PM PGY-2, [place] Medicine Service pager [phone]

## 2021-03-22 NOTE — Discharge Summary (Signed)
[name] Discharge Summary  Patient name: [name] Medical record number: [id number] Date of birth: 04-21-1940 Age: 81 y.o. Gender: female Date of Admission: 03/19/2021  Date of Discharge: 03/22/21 Admitting Physician: Blane Ohara McDiarmid, MD  Primary Care Provider: [name], MD Consultants: Nephrology   Indication for Hospitalization: Nausea, Vomiting, Fatigue   Discharge Diagnoses/Problem List:  Principal Problem:   Nausea & vomiting Active Problems:   Hypothyroidism   Anxiety state   GASTROESOPHAGEAL REFLUX DISEASE, CHRONIC   Obesity, Class III, BMI 40-49.9 (morbid obesity) ([place])   DM neuropathy, type II diabetes mellitus ([place])   Hypertension associated with chronic kidney disease due to type 2 diabetes mellitus ([place])   Hyperlipidemia   A-fib (HCC)   Diabetes mellitus with renal manifestation ([place])   ESRD on dialysis ([place])   Blood loss anemia   Anemia of renal disease   Hyperkalemia   Gastroparesis   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 rate 16, height 5\' 2"  (1.575 m), weight 92.5 kg, SpO2 99 %. General: Alert and oriented in no apparent distress, sitting at bedside with daughter  Heart: Regular rate and regular rhythm with no murmurs appreciated Lungs: CTA bilaterally, no wheezing Abdomen: Bowel sounds present, colostomy bag in place with liquid stool, no blood present, no TTP Skin: Warm and dry Extremities: No lower extremity edema  Brief Hospital Course:  [name] is a 81 y.o. female with presenting with fatigue, nausea and vomiting for 3 days and found to have electrolyte derangements. 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 Hospital course is outlined below:   Fatigue   Nausea, Vomiting   Electrolyte Derangements with ESRD  Patient has a h/o gastroparesis that is likely exacerbated and the cause of abdominal symptoms. Nausea and vomiting was treated symptomatically with phenergan initially and advancing diet as tolerated.  Patient had a foul smelling urine with UA that showed many bacteria and >50 wbc, but we were instructed not to treat with nephrology recommendations. We were able to advance diet, and patient was tolerating a regular diet prior to discharge. She also saw nutrition prior to discharge and discussed a diet plan that worked for her. Pt needs to see [facility] at [facility] for motility assistance.   ESRD on HD TTS   ACD   Hypotension with HD Pt received HD while inpatient and patient tends to receive midodrine during hypotension episodes with HD.  Per patient she receives midodrine prior to, during, and sometimes after hemodialysis.  She had soft blood pressures throughout her admission, and she seemed to be symptomatic with orthostatic hypotension.  Patient saw PT and OT during admission.  While in the hospital, nephrology increased her midodrine doses that seem to provide benefit to her symptoms.  She will follow-up with nephrology for HD, deferring to nephrology for midodrine dosage.  PAF on Eliquis There is initial concern about a bleed in patient's colostomy bag on admission.  However, she did not present with any active bleeding during the admission.  We did dose adjust her Eliquis to 2.5 mg twice daily.  She was discharged on this dose.  Hemoglobin stable at discharge to 8.1.  She will need a recheck CBC to make sure her hemoglobin stays above transfusion threshold of 8.  Other chronic and stable conditions managed appropriately.  Issues for follow up Recheck CBC  to ensure Hgb stable soon after discharge. Transfusion threshold 8.  Patient needs referral to [facility] at [facility] for motility assistance Continue with HD outpatient Discharged on Eliquis 2.5 mg BID     Significant Procedures: None   Significant Labs and Imaging:  Recent Labs  Lab 03/19/21 1600 03/19/21 1656 03/21/21 0440 03/21/21 1118 03/22/21 0220  WBC 16.1*  --  6.6  --  7.2  HGB 9.8*   < > 7.8* 8.6* 8.1*  HCT 34.2*   < > 27.7* 29.9* 28.7*  PLT 348  --  239  --  274   < > = values in this interval not displayed.   Recent Labs  Lab 03/19/21 1656 03/19/21 1805 03/21/21 0440 03/21/21 1118 03/22/21 0220  NA 126* 128* 136  --  134*  K 6.3* 5.1 3.8  --  3.8  CL 91* 89* 103  --  100  CO2  --  26 25  --  23  GLUCOSE 199* 227* 105*  --  157*  BUN 63* 42* 19  --  31*  CREATININE 11.20* 9.78* 5.32*  --  7.11*  CALCIUM  --  8.6* 7.9*  --  8.6*  MG  --   --  1.9  --  2.0  PHOS  --   --   --  5.0*  --   ALKPHOS  --  203*  --   --   --   AST  --  63*  --   --   --   ALT  --  30  --   --   --   ALBUMIN  --  2.4*  --   --   --     DG Chest Port 1 View  Result Date: 03/19/2021 CLINICAL DATA:  Questionable sepsis - evaluate for abnormality EXAM: PORTABLE CHEST 1 VIEW COMPARISON:  Radiograph 02/26/2021, CT 02/27/2021 FINDINGS: Stable positioning of left-sided dialysis catheter. Implanted loop recorder in the left chest wall. Unchanged cardiomegaly. Stable mediastinal contours with aortic atherosclerosis. No pulmonary edema, focal airspace disease, pleural effusion, or pneumothorax. Stable osseous structures. IMPRESSION: Unchanged cardiomegaly. No acute chest findings. Electronically Signed   By: Keith Rake M.D.   On: 03/19/2021 17:05   DG Foot Complete Left  Result Date: 03/19/2021 CLINICAL DATA:  Left foot pain, no known injury, initial encounter EXAM: LEFT FOOT - COMPLETE 3+ VIEW COMPARISON:  None. FINDINGS: There is no evidence of fracture or dislocation.  There is no evidence of arthropathy or other focal bone abnormality. Soft tissues are unremarkable. IMPRESSION: No acute abnormality noted. Electronically Signed   By: Inez Catalina M.D.   On: 03/19/2021 21:59     Results/Tests Pending at Time of Discharge:  Unresulted Labs (From admission, onward)     Start     Ordered   03/21/21 1841  .Hepatitis B Surface Antigen  Once,   R        03/21/21 1840   03/21/21 1841  Hepatitis B surface antibody  Once,   R        03/21/21 1840   03/19/21 1627  Urine Culture  (Undifferentiated presentation (screening labs and basic nursing orders))  ONCE - STAT,   STAT  Question:  Indication  Answer:  Sepsis   03/19/21 1626   Signed and Held  Renal function panel  Once,   R        Signed and Held   Signed and Held  CBC  Once,   R        Signed and Held   Signed and Held  Hepatitis B surface antigen  (New Admission Hemo Labs (Hepatitis B))  Once,   R        Signed and Held   Signed and Held  Hepatitis B surface antibody  (New Admission Hemo Labs (Hepatitis B))  Once,   R        Signed and Held   Signed and Held  Hepatitis B surface antibody,quantitative  (New Admission Hemo Labs (Hepatitis B))  Once,   R        Signed and Held             Discharge Medications:  Allergies as of 03/22/2021       Reactions   Adhesive [tape] Itching, Swelling, Rash, Other (See Comments)   Tears skin and causes blisters also. EKG pads will cause welts   Avelox [moxifloxacin] Swelling, Rash   Banana Anaphylaxis, Other (See Comments)   Blisters appear also   Blueberry Flavor Anaphylaxis   Cantaloupe Extract Allergy Skin Test Anaphylaxis, Other (See Comments)   Blisters appear also   Cefprozil Shortness Of Breath, Rash, Other (See Comments)   Tolerated ceftriaxone on 06/20/20   Cetacaine [butamben-tetracaine-benzocaine] Nausea And Vomiting, Swelling   Dicyclomine Nausea And Vomiting, Other (See Comments)   "Heart trouble"; Headaches and increased blood sugars    Food Anaphylaxis, Other (See Comments)   Melons- throat closes and blisters appear   Imdur [isosorbide Nitrate] Hives, Palpitations, Other (See Comments), Rash   Headaches also   Januvia [sitagliptin] Shortness Of Breath   Lipitor [atorvastatin] Shortness Of Breath   Losartan Potassium Shortness Of Breath   Nitroglycerin Other (See Comments)   Caused cardiac arrest and feels like skin bring torn off back of head   Omeprazole Shortness Of Breath, Swelling   Oxycodone Hives, Rash, Other (See Comments)   Tolerates Dilaudid   Penicillins Anaphylaxis   Has patient had a PCN reaction causing immediate rash, facial/tongue/throat swelling, SOB or lightheadedness with hypotension: Yes Has patient had a PCN reaction causing severe rash involving mucus membranes or skin necrosis: No Has patient had a PCN reaction that required hospitalization Yes Has patient had a PCN reaction occurring within the last 10 years: No   Prednisone Anaphylaxis   Vancomycin Anaphylaxis   Watermelon [citrullus Vulgaris] Anaphylaxis, Other (See Comments)   Blisters appear also   Hydrocodone Hives, Other (See Comments)   Tolerates Dilaudid   Latex Rash, Other (See Comments)   Blisters   Tamiflu [oseltamivir] Other (See Comments)   Contraindicated with other medications Patient on tikosyn, and tamiflu interfered with anti arrhythmic med Contraindicated with other medications Patient on tikosyn, and tamiflu interfered with anti arrhythmic med   Feraheme [ferumoxytol] Other (See Comments)   Sharp pain to lower back and flank   Other Other (See Comments)   Hydrogen - unknown- patient does not recall this (??)   Lasix [furosemide] Hives, Swelling, Rash   Mupirocin Rash        Medication List     TAKE these medications    (feeding supplement) PROSource Plus liquid Take 30 mLs by mouth 2 (two) times daily between meals.   acetaminophen 500  MG tablet Commonly known as: TYLENOL Take 0.5 tablets (250 mg total)  by mouth every 6 (six) hours as needed for mild pain (pain). What changed:  how much to take when to take this   albuterol 108 (90 Base) MCG/ACT inhaler Commonly known as: VENTOLIN HFA Inhale 1-2 puffs into the lungs every 6 (six) hours as needed for wheezing or shortness of breath.   amiodarone 200 MG tablet Commonly known as: PACERONE TAKE 1 TABLET ONE TIME DAILY What changed: See the new instructions.   calcitRIOL 0.5 MCG capsule Commonly known as: ROCALTROL Take 2 capsules (1 mcg total) by mouth Every Tuesday,Thursday,and Saturday with dialysis.   cetirizine 10 MG tablet Commonly known as: ZYRTEC Take 10 mg by mouth daily.   clonazePAM 1 MG tablet Commonly known as: KLONOPIN Take 0.5-1 mg by mouth See admin instructions. Take 1 mg by mouth at bedtime on Sun/Mon/Wed/Fri and 0.5 mg at bedtime on Tues/Thurs/Sat- may take an additional 0.5-1 mg up to two (more) times a day as needed for anxiety (total combined daily dose is a max of 3 milligrams)   Eliquis 2.5 MG Tabs tablet Generic drug: apixaban Take 1 tablet (2.5 mg total) by mouth 2 (two) times daily. What changed:  medication strength how much to take   estradiol 1 MG tablet Commonly known as: ESTRACE Take 1 mg by mouth daily.   famotidine 20 MG tablet Commonly known as: PEPCID Take 20 mg by mouth in the morning.   fluticasone 50 MCG/ACT nasal spray Commonly known as: FLONASE Place 1 spray into both nostrils daily.   levalbuterol 1.25 MG/3ML nebulizer solution Commonly known as: XOPENEX Take 1.25 mg by nebulization 2 (two) times daily as needed for wheezing or shortness of breath.   midodrine 10 MG tablet Commonly known as: PROAMATINE Take 1 tablet (10 mg total) by mouth 3 (three) times daily. Takes 10 mg on Tuesdays, Thursdays and Saturdays. Takes 10 mg extra if blood pressure drops in between dialysis. What changed:  when to take this additional instructions   multivitamin Tabs tablet Take 1 tablet by  mouth at bedtime.   promethazine 12.5 MG tablet Commonly known as: PHENERGAN Take 12.5 mg by mouth every 6 (six) hours as needed for nausea or vomiting.   Synthroid 175 MCG tablet Generic drug: levothyroxine Take 175 mcg by mouth daily before breakfast.   True Metrix Blood Glucose Test test strip Generic drug: glucose blood   TRUEplus Lancets 33G Misc   vitamin B-12 500 MCG tablet Commonly known as: CYANOCOBALAMIN Take 500 mcg by mouth daily.        Discharge Instructions: Please refer to Patient Instructions section of EMR for full details.  Patient was counseled important signs and symptoms that should prompt return to medical care, changes in medications, dietary instructions, activity restrictions, and follow up appointments.   Follow-Up Appointments:  Follow-up Information     [name], MD. Go on 04/08/2021.   Specialty: Family Medicine Why: @2 :40PM Contact information: [place] [address] [phone]         [name], Peter M, MD .   Specialty: Cardiology Contact information: [address] [place] [address] [place], [facility] Follow up.   Specialty: Home Health Services Why: Someone will call you to schedule first home visit. Contact information: [place] [address] [phone]  Erskine Emery, MD 03/22/2021, 11:30 AM PGY-1, [place]

## 2021-03-22 NOTE — Progress Notes (Signed)
Physical Therapy Treatment Patient Details Name: [name] MRN: [id number] DOB: 08/19/1940 Today's Date: 03/22/2021   History of Present Illness 81 y.o. female presenting to ED 2/22 with fatigue, nausea and vomiting for 3 days. Found to have electrolyte derangements. 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    PT Comments    Pt is making slow but steady progress towards goals. She remains positive for symptomatic orthostatic hypotension with both systolic and diastolic BP dropping >67. Pt unable to complete second standing BP due to feeling fatigued and dizzy. Allowed pt to rest in seated position before attempting short distance gait to recliner chair. Gait is unsteady and distance limited due to orthostatics. Pt reports she has been sitting EOB for hours each day and moving her LEs to keep the blood flowing. Pt would not be safe to ambulate independently until BP is stabilized, question her safety to return home today. Will continue to follow acutely for mobility progression and to maximize safety with mobility.     Recommendations for follow up therapy are one component of a multi-disciplinary discharge planning process, led by the attending physician.  Recommendations may be updated based on patient status, additional functional criteria and insurance authorization.  Follow Up Recommendations  Home health PT     Assistance Recommended at Discharge Intermittent Supervision/Assistance  Patient can return home with the following A little help with bathing/dressing/bathroom;Assistance with cooking/housework;Help with stairs or ramp for entrance   Equipment Recommendations  None recommended by PT    Recommendations for Other Services       Precautions / Restrictions Precautions Precautions: Fall Precaution Comments: on rollator chronically with O2  PRN, check sats with gait Restrictions Weight Bearing Restrictions: No     Mobility  Bed Mobility Overal bed mobility: Needs Assistance Bed Mobility: Sit to Sidelying, Rolling         Sit to sidelying: Min assist General bed mobility comments: reached for PTA's hand to pull up, but min A required.    Transfers Overall transfer level: Needs assistance Equipment used: Rollator (4 wheels) Transfers: Sit to/from Stand Sit to Stand: Min guard           General transfer comment: cues for hand placement as pt tends to pull up on RW    Ambulation/Gait Ambulation/Gait assistance: Min guard Gait Distance (Feet): 8 Feet Assistive device: Rollator (4 wheels) Gait Pattern/deviations: Wide base of support, Step-through pattern, Trunk flexed (mild trunk flexion) Gait velocity: decreased Gait velocity interpretation: <1.31 ft/sec, indicative of household ambulator   General Gait Details: mildly unsteady gait with close min guard as pt was positive for orthostatic hypotension and c/o feeling weak/fatigued. SpO2 at 100% on RA   Stairs             Wheelchair Mobility    Modified Rankin (Stroke Patients Only)       Balance Overall balance assessment: Needs assistance Sitting-balance support: No upper extremity supported, Feet supported Sitting balance-Leahy Scale: Fair     Standing balance support: Bilateral upper extremity supported, During functional activity Standing balance-Leahy Scale: Poor Standing balance comment: less than fair dynamically                            Cognition Arousal/Alertness: Awake/alert Behavior During Therapy: WFL for tasks assessed/performed Overall Cognitive Status: Within Functional Limits for tasks assessed  General Comments: Requiring increased time for following commands due to [facility] and dizziness        Exercises General Exercises - Lower Extremity Ankle Circles/Pumps:  AROM, Both, 20 reps, Seated Long Arc Quad: AROM, 20 reps, Seated Hip Flexion/Marching: AROM, Both, 10 reps, Seated    General Comments General comments (skin integrity, edema, etc.): Daughter present and advocating for patient. Patient with symptomatic orthostatics reporting dizziness and lightheadedness.      Pertinent Vitals/Pain Pain Assessment Pain Assessment: No/denies pain    Home Living                          Prior Function            PT Goals (current goals can now be found in the care plan section) Acute Rehab PT Goals Patient Stated Goal: to be able to walk and figure out why I have no energy PT Goal Formulation: With patient/family Time For Goal Achievement: 04/04/21 Potential to Achieve Goals: Good Progress towards PT goals: Progressing toward goals    Frequency    Min 3X/week      PT Plan Current plan remains appropriate    Co-evaluation              AM-PAC PT "6 Clicks" Mobility   Outcome Measure  Help needed turning from your back to your side while in a flat bed without using bedrails?: A Little Help needed moving from lying on your back to sitting on the side of a flat bed without using bedrails?: A Little Help needed moving to and from a bed to a chair (including a wheelchair)?: A Little Help needed standing up from a chair using your arms (e.g., wheelchair or bedside chair)?: A Little Help needed to walk in hospital room?: A Little Help needed climbing 3-5 steps with a railing? : A Lot 6 Click Score: 17    End of Session Equipment Utilized During Treatment: Gait belt Activity Tolerance: Patient tolerated treatment well;Treatment limited secondary to medical complications (Comment) (+ orthostatics) Patient left: with call bell/phone within reach;with family/visitor present;in chair (sitting EOB) Nurse Communication: Mobility status;Other (comment) (orthostatics) PT Visit Diagnosis: Unsteadiness on feet (R26.81);Difficulty in  walking, not elsewhere classified (R26.2)     Time: 2992-4268 PT Time Calculation (min) (ACUTE ONLY): 53 min  Charges:  $Gait Training: 8-22 mins $Therapeutic Exercise: 8-22 mins $Therapeutic Activity: 23-37 mins                    Benjiman Core, [place] Pager [phone] Acute Rehab  Allena Katz 03/22/2021, 2:05 PM

## 2021-03-23 ENCOUNTER — Telehealth: Payer: Self-pay | Admitting: Nephrology

## 2021-03-23 LAB — TYPE AND SCREEN
ABO/RH(D): A POS
Antibody Screen: NEGATIVE
Unit division: 0

## 2021-03-23 LAB — HEPATITIS B SURFACE ANTIBODY, QUANTITATIVE: Hep B S AB Quant (Post): <3.1 m[IU]/mL — ABNORMAL LOW (ref >9.9)

## 2021-03-23 LAB — BPAM RBC
Blood Product Expiration Date: 202303082359
ISSUE DATE / TIME: 202302241058
Unit Type and Rh: 6200

## 2021-03-23 LAB — URINE CULTURE: Culture: >=100000 — AB

## 2021-03-23 NOTE — Plan of Care (Signed)
  Problem: Health Behavior/Discharge Planning: Goal: Ability to manage health-related needs will improve Outcome: Progressing   Problem: Clinical Measurements: Goal: Ability to maintain clinical measurements within normal limits will improve Outcome: Progressing   

## 2021-03-23 NOTE — Plan of Care (Signed)
°  Problem: Education: Goal: Knowledge of General Education information will improve Description: Including pain rating scale, medication(s)/side effects and non-pharmacologic comfort measures 03/23/2021 0120 by Greggory Stallion, Thurston Hole, RN Outcome: Adequate for Discharge 03/23/2021 0119 by Greggory Stallion, Thurston Hole, RN Outcome: Progressing   Problem: Health Behavior/Discharge Planning: Goal: Ability to manage health-related needs will improve 03/23/2021 0120 by Henrine Screws, RN Outcome: Adequate for Discharge 03/23/2021 0119 by Greggory Stallion, Thurston Hole, RN Outcome: Progressing   Problem: Clinical Measurements: Goal: Ability to maintain clinical measurements within normal limits will improve 03/23/2021 0120 by Henrine Screws, RN Outcome: Adequate for Discharge 03/23/2021 0119 by Greggory Stallion, Thurston Hole, RN Outcome: Progressing Goal: Will remain free from infection 03/23/2021 0120 by Henrine Screws, RN Outcome: Adequate for Discharge 03/23/2021 0119 by Greggory Stallion, Thurston Hole, RN Outcome: Progressing Goal: Diagnostic test results will improve 03/23/2021 0120 by Henrine Screws, RN Outcome: Adequate for Discharge 03/23/2021 0119 by Greggory Stallion, Thurston Hole, RN Outcome: Progressing Goal: Respiratory complications will improve 03/23/2021 0120 by Henrine Screws, RN Outcome: Adequate for Discharge 03/23/2021 0119 by Greggory Stallion, Thurston Hole, RN Outcome: Progressing Goal: Cardiovascular complication will be avoided 03/23/2021 0120 by Henrine Screws, RN Outcome: Adequate for Discharge 03/23/2021 0119 by Henrine Screws, RN Outcome: Progressing   Problem: Activity: Goal: Risk for activity intolerance will decrease 03/23/2021 0120 by Henrine Screws, RN Outcome: Adequate for Discharge 03/23/2021 0119 by Greggory Stallion, Thurston Hole, RN Outcome: Progressing   Problem: Nutrition: Goal: Adequate nutrition will be maintained 03/23/2021 0120 by Henrine Screws, RN Outcome:  Adequate for Discharge 03/23/2021 0119 by Greggory Stallion, Thurston Hole, RN Outcome: Progressing   Problem: Coping: Goal: Level of anxiety will decrease 03/23/2021 0120 by Henrine Screws, RN Outcome: Adequate for Discharge 03/23/2021 0119 by Greggory Stallion, Thurston Hole, RN Outcome: Progressing   Problem: Elimination: Goal: Will not experience complications related to bowel motility 03/23/2021 0120 by Henrine Screws, RN Outcome: Adequate for Discharge 03/23/2021 0119 by Greggory Stallion, Thurston Hole, RN Outcome: Progressing Goal: Will not experience complications related to urinary retention 03/23/2021 0120 by Henrine Screws, RN Outcome: Adequate for Discharge 03/23/2021 0119 by Henrine Screws, RN Outcome: Progressing   Problem: Pain Managment: Goal: General experience of comfort will improve 03/23/2021 0120 by Henrine Screws, RN Outcome: Adequate for Discharge 03/23/2021 0119 by Greggory Stallion, Thurston Hole, RN Outcome: Progressing   Problem: Safety: Goal: Ability to remain free from injury will improve 03/23/2021 0120 by Henrine Screws, RN Outcome: Adequate for Discharge 03/23/2021 0119 by Greggory Stallion, Thurston Hole, RN Outcome: Progressing   Problem: Skin Integrity: Goal: Risk for impaired skin integrity will decrease 03/23/2021 0120 by Henrine Screws, RN Outcome: Adequate for Discharge 03/23/2021 0119 by Henrine Screws, RN Outcome: Progressing

## 2021-03-23 NOTE — Progress Notes (Signed)
[facility] Discharge Summary  Patient name: [name] Medical record number: [id number] Date of birth: 1940/03/04 Age: 81 y.o. Gender: female Date of Admission: 03/19/2021  Date of Discharge: 03/23/2021 Admitting Physician: Blane Ohara McDiarmid, MD  Primary Care Provider: [name], MD Consultants: Nephrology     Indication for Hospitalization: nausea, vomiting, fatigue  Discharge Diagnoses/Problem List:  Nausea & vomiting Active Problems:   Hypothyroidism   Anxiety state   GASTROESOPHAGEAL REFLUX DISEASE, CHRONIC   Obesity, Class III, BMI 40-49.9 (morbid obesity) ([place])   DM neuropathy, type II diabetes mellitus ([place])   Hypertension associated with chronic kidney disease due to type 2 diabetes mellitus ([place])   Hyperlipidemia   A-fib ([place])   Diabetes mellitus with renal manifestation ([place])   ESRD on dialysis ([place])   Blood loss anemia   Anemia of renal disease   Hyperkalemia   Gastroparesis   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 female with presenting with fatigue, nausea and vomiting for 3 days and found to have electrolyte derangements. 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 Hospital course is outlined below:   Fatigue   Nausea, Vomiting   Electrolyte  Derangements with ESRD  Patient has a h/o gastroparesis that is likely exacerbated and the cause of abdominal symptoms. Nausea and vomiting was treated symptomatically with phenergan initially and advancing diet as tolerated.  Patient had a foul smelling urine with UA that showed many bacteria and >50 wbc, but we were instructed not to treat with nephrology recommendations. We were able to advance diet, and patient was tolerating a regular diet prior to discharge. She also saw nutrition prior to discharge and discussed a diet plan that worked for her. Pt needs to see [facility] at [facility] for motility assistance.   ESRD on HD TTS   ACD   Hypotension with HD Pt received HD while inpatient and patient tends to receive midodrine during hypotension episodes with HD.  Per patient she receives midodrine prior to, during, and sometimes after hemodialysis.  She had soft blood pressures throughout her admission, and she seemed to be symptomatic with orthostatic hypotension.  Patient saw PT and OT during admission.  While in the hospital, nephrology increased her midodrine doses that seem to provide benefit to her symptoms.  She will follow-up with nephrology for HD, deferring to nephrology for midodrine dosage.  PAF on Eliquis There is initial concern about a bleed in patient's colostomy bag on admission.  However, she did not present with any active bleeding during the admission.  We did dose adjust her Eliquis to 2.5 mg twice daily.  She was discharged on this dose.  Hemoglobin stable at discharge to 8.1.  She will need a recheck CBC to make sure her hemoglobin stays above transfusion threshold of 8.  Other chronic and stable conditions  managed appropriately.  Issues for follow up Recheck CBC to ensure Hgb stable soon after discharge. Transfusion threshold 8.  Patient needs referral to [facility] at [facility] for motility assistance Continue with HD outpatient Discharged on Eliquis 2.5 mg  BID    Significant Procedures: none   Significant Labs and Imaging:  Recent Labs  Lab 03/19/21 1600 03/19/21 1656 03/21/21 0440 03/21/21 1118 03/22/21 0220  WBC 16.1*  --  6.6  --  7.2  HGB 9.8*   < > 7.8* 8.6* 8.1*  HCT 34.2*   < > 27.7* 29.9* 28.7*  PLT 348  --  239  --  274   < > = values in this interval not displayed.   Recent Labs  Lab 03/19/21 1656 03/19/21 1805 03/21/21 0440 03/21/21 1118 03/22/21 0220  NA 126* 128* 136  --  134*  K 6.3* 5.1 3.8  --  3.8  CL 91* 89* 103  --  100  CO2  --  26 25  --  23  GLUCOSE 199* 227* 105*  --  157*  BUN 63* 42* 19  --  31*  CREATININE 11.20* 9.78* 5.32*  --  7.11*  CALCIUM  --  8.6* 7.9*  --  8.6*  MG  --   --  1.9  --  2.0  PHOS  --   --   --  5.0*  --   ALKPHOS  --  203*  --   --   --   AST  --  63*  --   --   --   ALT  --  30  --   --   --   ALBUMIN  --  2.4*  --   --   --     Results/Tests Pending at Time of Discharge:   Discharge Medications:  Allergies as of 03/23/2021       Reactions   Adhesive [tape] Itching, Swelling, Rash, Other (See Comments)   Tears skin and causes blisters also. EKG pads will cause welts   Avelox [moxifloxacin] Swelling, Rash   Banana Anaphylaxis, Other (See Comments)   Blisters appear also   Blueberry Flavor Anaphylaxis   Cantaloupe Extract Allergy Skin Test Anaphylaxis, Other (See Comments)   Blisters appear also   Cefprozil Shortness Of Breath, Rash, Other (See Comments)   Tolerated ceftriaxone on 06/20/20   Cetacaine [butamben-tetracaine-benzocaine] Nausea And Vomiting, Swelling   Dicyclomine Nausea And Vomiting, Other (See Comments)   "Heart trouble"; Headaches and increased blood sugars   Food Anaphylaxis, Other (See Comments)   Melons- throat closes and blisters appear   Imdur [isosorbide Nitrate] Hives, Palpitations, Other (See Comments), Rash   Headaches also   Januvia [sitagliptin] Shortness Of Breath   Lipitor [atorvastatin] Shortness Of Breath   Losartan Potassium  Shortness Of Breath   Nitroglycerin Other (See Comments)   Caused cardiac arrest and feels like skin bring torn off back of head   Omeprazole Shortness Of Breath, Swelling   Oxycodone Hives, Rash, Other (See Comments)   Tolerates Dilaudid   Penicillins Anaphylaxis   Has patient had a PCN reaction causing immediate rash, facial/tongue/throat swelling, SOB or lightheadedness with hypotension: Yes Has patient had a PCN reaction causing severe rash involving mucus membranes or skin necrosis: No Has patient had a PCN reaction that required hospitalization Yes Has patient had a PCN reaction occurring within the last 10 years: No   Prednisone Anaphylaxis   Vancomycin Anaphylaxis   Watermelon [citrullus Vulgaris] Anaphylaxis,  Other (See Comments)   Blisters appear also   Hydrocodone Hives, Other (See Comments)   Tolerates Dilaudid   Latex Rash, Other (See Comments)   Blisters   Tamiflu [oseltamivir] Other (See Comments)   Contraindicated with other medications Patient on tikosyn, and tamiflu interfered with anti arrhythmic med Contraindicated with other medications Patient on tikosyn, and tamiflu interfered with anti arrhythmic med   Feraheme [ferumoxytol] Other (See Comments)   Sharp pain to lower back and flank   Other Other (See Comments)   Hydrogen - unknown- patient does not recall this (??)   Lasix [furosemide] Hives, Swelling, Rash   Mupirocin Rash        Medication List     TAKE these medications    (feeding supplement) PROSource Plus liquid Take 30 mLs by mouth 2 (two) times daily between meals.   acetaminophen 500 MG tablet Commonly known as: TYLENOL Take 0.5 tablets (250 mg total) by mouth every 6 (six) hours as needed for mild pain (pain). What changed:  how much to take when to take this   albuterol 108 (90 Base) MCG/ACT inhaler Commonly known as: VENTOLIN HFA Inhale 1-2 puffs into the lungs every 6 (six) hours as needed for wheezing or shortness of breath.    amiodarone 200 MG tablet Commonly known as: PACERONE TAKE 1 TABLET ONE TIME DAILY What changed: See the new instructions.   calcitRIOL 0.5 MCG capsule Commonly known as: ROCALTROL Take 2 capsules (1 mcg total) by mouth Every Tuesday,Thursday,and Saturday with dialysis.   cetirizine 10 MG tablet Commonly known as: ZYRTEC Take 10 mg by mouth daily.   clonazePAM 1 MG tablet Commonly known as: KLONOPIN Take 0.5-1 mg by mouth See admin instructions. Take 1 mg by mouth at bedtime on Sun/Mon/Wed/Fri and 0.5 mg at bedtime on Tues/Thurs/Sat- may take an additional 0.5-1 mg up to two (more) times a day as needed for anxiety (total combined daily dose is a max of 3 milligrams)   Eliquis 2.5 MG Tabs tablet Generic drug: apixaban Take 1 tablet (2.5 mg total) by mouth 2 (two) times daily. What changed:  medication strength how much to take   estradiol 1 MG tablet Commonly known as: ESTRACE Take 1 mg by mouth daily.   famotidine 20 MG tablet Commonly known as: PEPCID Take 20 mg by mouth in the morning.   fluticasone 50 MCG/ACT nasal spray Commonly known as: FLONASE Place 1 spray into both nostrils daily.   levalbuterol 1.25 MG/3ML nebulizer solution Commonly known as: XOPENEX Take 1.25 mg by nebulization 2 (two) times daily as needed for wheezing or shortness of breath.   midodrine 10 MG tablet Commonly known as: PROAMATINE Take 1 tablet (10 mg total) by mouth 3 (three) times daily. Takes 10 mg on Tuesdays, Thursdays and Saturdays. Takes 10 mg extra if blood pressure drops in between dialysis. What changed:  when to take this additional instructions   multivitamin Tabs tablet Take 1 tablet by mouth at bedtime.   promethazine 12.5 MG tablet Commonly known as: PHENERGAN Take 12.5 mg by mouth every 6 (six) hours as needed for nausea or vomiting.   Synthroid 175 MCG tablet Generic drug: levothyroxine Take 175 mcg by mouth daily before breakfast.   True Metrix Blood Glucose  Test test strip Generic drug: glucose blood   TRUEplus Lancets 33G Misc   vitamin B-12 500 MCG tablet Commonly known as: CYANOCOBALAMIN Take 500 mcg by mouth daily.        Discharge Instructions: Please  refer to Patient Instructions section of EMR for full details.  Patient was counseled important signs and symptoms that should prompt return to medical care, changes in medications, dietary instructions, activity restrictions, and follow up appointments.   Follow-Up Appointments:  Follow-up Information     [name], MD. Go on 04/08/2021.   Specialty: Family Medicine Why: @2 :40PM Contact information: [place] [address] [phone]         [name], Peter M, MD .   Specialty: Cardiology Contact information: [address] [place] [address] [place], [facility] Follow up.   Specialty: Home Health Services Why: Someone will call you to schedule first home visit. Contact information: [place] [address] [phone]                 Lattie Haw, MD 03/23/2021, 2:15 AM PGY-3, [place]

## 2021-03-23 NOTE — Telephone Encounter (Signed)
Transition of Care Contact from [place]   Date of Discharge: 03/22/21 Date of Contact: 03/23/21 Method of contact: phone Talked to patient   Patient contacted to discuss transition of care form recent hospitaliztion. Patient was admitted to [facility] from 2/22 to 03/22/21 with the discharge diagnosis of weakness/dizziness, nausea/vomiting and hypotension.     Medication changes were reviewed - eliquis decreased to 2.5mg  BID and midodrine increased to 10mg  TID.  Instructions unclear about midodrine discussed with daughter how to take.   Patient will follow up with is outpatient dialysis center 03/25/21.   Other follow up needs include non identified.  Reports ongoing hypotension but has not taken midodrine today.  Keeping BP log, giving midodrine now.  Follow up with cardiology tomorrow.    Jen Mow, PA-C [facility] Pager: [phone]

## 2021-03-23 NOTE — Progress Notes (Signed)
Telemetry and peripheral IV removed. Education printed and discussed with patient and family. All belongings had been gathered. Patient wheeled down by this RN and left via personal vehicle.

## 2021-03-24 ENCOUNTER — Other Ambulatory Visit: Payer: Self-pay

## 2021-03-24 ENCOUNTER — Ambulatory Visit (INDEPENDENT_AMBULATORY_CARE_PROVIDER_SITE_OTHER): Payer: Medicare HMO | Admitting: Cardiology

## 2021-03-24 ENCOUNTER — Encounter: Payer: Self-pay | Admitting: Cardiology

## 2021-03-24 VITALS — BP 102/56 | HR 62 | Ht 62.0 in | Wt 200.0 lb

## 2021-03-24 DIAGNOSIS — I2510 Atherosclerotic heart disease of native coronary artery without angina pectoris: Secondary | ICD-10-CM | POA: Diagnosis not present

## 2021-03-24 DIAGNOSIS — I739 Peripheral vascular disease, unspecified: Secondary | ICD-10-CM

## 2021-03-24 DIAGNOSIS — N186 End stage renal disease: Secondary | ICD-10-CM | POA: Diagnosis not present

## 2021-03-24 DIAGNOSIS — Z992 Dependence on renal dialysis: Secondary | ICD-10-CM

## 2021-03-24 DIAGNOSIS — I480 Paroxysmal atrial fibrillation: Secondary | ICD-10-CM

## 2021-03-24 LAB — CULTURE, BLOOD (ROUTINE X 2)
Culture: NO GROWTH
Special Requests: ADEQUATE

## 2021-03-24 MED ORDER — SEVELAMER HCL 800 MG PO TABS
800.0000 mg | ORAL_TABLET | Freq: Three times a day (TID) | ORAL | Status: DC
Start: 2021-03-24 — End: 2021-05-26

## 2021-03-25 ENCOUNTER — Ambulatory Visit: Payer: Medicare HMO

## 2021-03-26 ENCOUNTER — Other Ambulatory Visit (HOSPITAL_COMMUNITY): Payer: Self-pay | Admitting: Cardiology

## 2021-03-27 ENCOUNTER — Telehealth: Payer: Self-pay

## 2021-03-27 NOTE — Telephone Encounter (Signed)
Patients daughter calls nurse line reporting UTI symptoms in patient.  ? ?Daughter reports urgency, back pain, abnormal odor and color. Daughter reports a urine culture was obtained during her hospital stay, however they never received results or if an antibiotic was warranted. Daughter denies nausea, vomiting or fever/chills.  ? ?Daughter is requesting an antibiotic. Will forward to PCP to advise.  ? ?PCP apt scheduled for 3/14. ?

## 2021-03-28 NOTE — Telephone Encounter (Signed)
Received return phone call from Hospice/Palliative care nurse regarding the below message.  ? ?Reports that patient is very concerned as she has not heard back from provider.  ? ?Provider paged at [phone]. Will await response.  ? ?Talbot Grumbling, RN ? ?

## 2021-03-28 NOTE — Telephone Encounter (Signed)
Patients daughter calls nurse line checking the status.  ? ?Will forward to PCP.  ?

## 2021-03-31 NOTE — Telephone Encounter (Signed)
Called patient and unable to reach patient. Left a generic voice message. Will call patient again this afternoon.  ?

## 2021-04-01 ENCOUNTER — Other Ambulatory Visit: Payer: Self-pay | Admitting: Student

## 2021-04-01 DIAGNOSIS — N342 Other urethritis: Secondary | ICD-10-CM

## 2021-04-01 MED ORDER — FOSFOMYCIN TROMETHAMINE 3 G PO PACK: 3.0000 g | PACK | Freq: Once | ORAL | 0 refills | Status: DC

## 2021-04-01 NOTE — Progress Notes (Addendum)
Spoke with daughter on the phone. Daughter called in about pt having lower back pain, odorous urine, increased frequence prior to hospital discharge. UA a week ago showed pyuria and bacteruira consistent with UTI.  Informed daughter due to mother's extensive allergies I will be sending in a prescription for Fosfomycin for UTI which is a one-time dose which should be taken on a day she havs dialysis. Daughter is unaware if patient is allergic to Fosfomycin. Explained to daughter  the decision for this medication is because patient has history of allergies for other options and I see she has taken this medication in the past. Reviewed return precaution, side effects and symptoms to warrant ED visit. Daughter verbalized understanding and is agreeable to plan. ?

## 2021-04-02 MED ORDER — FOSFOMYCIN TROMETHAMINE 3 G PO PACK: 3.0000 g | PACK | Freq: Once | ORAL | 0 refills | Status: AC

## 2021-04-02 NOTE — Telephone Encounter (Addendum)
PA needed on Fosfomycin.  Clinical questions submitted via Cover My Meds.  Waiting on response, could take up to 72 hours. ? ?Cover My Meds info: ?Key: [id number] ? ?Received approval on medication through 01/25/2022. Pharmacy called with approval. They will contact patient once rx is ready for pick up.  ? ? Talbot Grumbling, RN ? ?

## 2021-04-02 NOTE — Telephone Encounter (Signed)
Received phone call from [place] manager regarding antibiotic. Medication did not go through electronically. Attempted to resend, rx still reverts to print.  ?  ?Called in to pharmacy. Pharmacist reports that this is not on insurance formulary. Preferred agents are macrobid and Keflex. Please advise if either of these medications are suitable alternatives.  ?  ?Talbot Grumbling, RN ?

## 2021-04-02 NOTE — Progress Notes (Signed)
Received phone call from [place] manager regarding antibiotic. Medication did not go through electronically. Attempted to resend, rx still reverts to print.  ? ?Called in to pharmacy. Pharmacist reports that this is not on insurance formulary. Preferred agents are macrobid and Keflex. Please advise if either of these medications are suitable alternatives.  ? ?Talbot Grumbling, RN ? ?

## 2021-04-02 NOTE — Addendum Note (Signed)
Addended by: Talbot Grumbling on: 04/02/2021 11:56 AM ? ? Modules accepted: Orders ? ?

## 2021-04-08 ENCOUNTER — Other Ambulatory Visit (HOSPITAL_COMMUNITY): Payer: Self-pay

## 2021-04-08 ENCOUNTER — Inpatient Hospital Stay: Payer: Medicare HMO | Admitting: Student

## 2021-04-17 ENCOUNTER — Telehealth: Payer: Self-pay | Admitting: Family Medicine

## 2021-04-17 NOTE — Telephone Encounter (Signed)
After-hours call ?[name] called the after-hours line regarding a question regarding her mother's medications.  She went to dialysis today and after leaving dialysis she felt "loopy".  They checked her blood pressure this afternoon and her systolic number was in the 80s and so they gave her her third dose of midodrine early.  The daughter is asking if she has problems with low blood pressure later tonight would it be okay to give her an additional dose of midodrine.  I touched base with the pharmacy regarding this and was told that generally we time the doses of midodrine as we do with concern for hypertension so that if she is still symptomatically hypotensive tonight it would be acceptable to give her an additional dose but this should not be done regularly.  Discussed if her mother had any other symptoms and she reports that it is just her normal nausea but no other symptoms.  Denies any chest pain or shortness of breath.  Discussed strict ED precautions and patient's daughter is agreeable to these.  No further questions or concerns. ?

## 2021-04-18 ENCOUNTER — Telehealth: Payer: Self-pay

## 2021-04-18 NOTE — Telephone Encounter (Signed)
[facility] calls nurse line to give PCP some information prior to 3/27 apt. ? ?Directions for Midodrine need to be clarified with specific times to take.  ? ?Patient has been having bleeding around her Ostomy site and perhaps Eliquis dose needs to be adjusted.  ? ?NP for their office is doing a home visit to check on her today.  ? ?This can all be discussed at 3/27 visit.  ?

## 2021-04-21 ENCOUNTER — Ambulatory Visit (INDEPENDENT_AMBULATORY_CARE_PROVIDER_SITE_OTHER): Payer: Medicare HMO | Admitting: Student

## 2021-04-21 ENCOUNTER — Other Ambulatory Visit: Payer: Self-pay | Admitting: Family Medicine

## 2021-04-21 ENCOUNTER — Other Ambulatory Visit: Payer: Self-pay

## 2021-04-21 ENCOUNTER — Encounter: Payer: Self-pay | Admitting: Student

## 2021-04-21 VITALS — BP 118/49 | HR 59 | Wt 201.0 lb

## 2021-04-21 DIAGNOSIS — E1122 Type 2 diabetes mellitus with diabetic chronic kidney disease: Secondary | ICD-10-CM | POA: Diagnosis not present

## 2021-04-21 DIAGNOSIS — I129 Hypertensive chronic kidney disease with stage 1 through stage 4 chronic kidney disease, or unspecified chronic kidney disease: Secondary | ICD-10-CM

## 2021-04-21 DIAGNOSIS — K5521 Angiodysplasia of colon with hemorrhage: Secondary | ICD-10-CM | POA: Diagnosis not present

## 2021-04-21 DIAGNOSIS — I480 Paroxysmal atrial fibrillation: Secondary | ICD-10-CM

## 2021-04-21 DIAGNOSIS — I959 Hypotension, unspecified: Secondary | ICD-10-CM

## 2021-04-21 LAB — POCT HEMOGLOBIN: Hemoglobin: 8.4 g/dL — AB (ref 11–14.6)

## 2021-04-21 MED ORDER — MIDODRINE HCL 10 MG PO TABS: 10.0000 mg | ORAL_TABLET | Freq: Three times a day (TID) | ORAL | 2 refills | Status: AC

## 2021-04-21 NOTE — Patient Instructions (Signed)
It was wonderful to meet you today. Thank you for allowing me to be a part of your care. Below is a short summary of what we discussed at your visit today: ? ?Please take your midodrine 3 times daily and keep a blood pressure diary to be reviewed at your next appointment. ? ?Follow-up with your GI doctor about bleeding around the ostomy bag ? ?Your hemoglobin today was 7.6.  Please hold your Eliquis until your CBC r results return and I will call for further updates. ? ?Follow-up in 2 weeks ? ? ?If you have any questions or concerns, please do not hesitate to contact us via phone or MyChart message.  ? ?[name], MD ?[facility]  ?

## 2021-04-21 NOTE — Assessment & Plan Note (Signed)
Patient appears hypotensive today with BP of 118/49.  She reports some hypotensive readings with orthostasis at home.  Her BP is usually much improved when she takes midodrine 3 times daily.  Informed patient midodrine dose is 10 mg 3 times daily moving forward. Recommended she keeps blood pressure diary which will be reviewed in her follow-up visit.  Fall precaution discussed with family who verbalized understanding. ?

## 2021-04-21 NOTE — Progress Notes (Signed)
? ? ?  SUBJECTIVE:  ? ?CHIEF COMPLAINT / HPI: Blood pressure follow-up ? ?Hypotension ?Patient presents today with daughter and son in law to clarify dosage for midodrine.  Patient reports she was unclear as to how she was supposed to take the midodrine.  She reports taking it sometimes 3 times daily and sometimes once or twice daily.  Blood pressures since the last time has ranged from systolic 32-671 and diastolic of 24-58.  On Friday with a home health nurse patient said her systolic was 099 and diastolic of 50 however with standing her blood pressure dropped to 62/45  She reports occasional dizziness with standing. Per patient her blood pressure drops even more after dialysis ? ?Bleeding around ostomy bag ?Patient reports she noticed intermittent bleeding around ostomy bag last week.  This usually occurs when she has specific foods.  Last week she noticed bleeding after she had some chips and also around the time after having popcorn.  This bleeding is intermittent and do not last long. ? ?PERTINENT  PMH / PSH: CKD on HD, A-fib on Eliquis, CHF, carotid stenosis. ? ?OBJECTIVE:  ? ?BP (!) 118/49   Pulse (!) 59   Wt 91.2 kg   SpO2 100%   BMI 36.76 kg/m?   ? ?Physical Exam ?General: Alert, well appearing, NAD, Oriented x4 ?Cardiovascular: RRR, No Murmurs, Normal S2/S2 ?Respiratory: CTAB, No wheezing or Rales ?Abdomen: No distension or tenderness.  Ostomy bag is well placed on the right abdomen with no signs of active bleeding or drainage. ?Extremities: No edema on extremities   ?Skin: Warm and dry ? ?ASSESSMENT/PLAN:  ? ?Arterial hypotension ?Patient appears hypotensive today with BP of 118/49.  She reports some hypotensive readings with orthostasis at home.  Her BP is usually much improved when she takes midodrine 3 times daily.  Informed patient midodrine dose is 10 mg 3 times daily moving forward. Recommended she keeps blood pressure diary which will be reviewed in her follow-up visit.  Fall precaution  discussed with family who verbalized understanding. ? ?Bleeding around Ostomy bag ?Patient reported bleeding around her ostomy bag sometime last week after eating chips and popcorn.  She has history of anemia and intermittent dizziness.  Her dizziness is likely multifactorial in the setting of hypotension and anemia.  She is also on Eliquis due to history of A-fib.  Her POCT hemoglobin today was 8.4 and her baseline is around 8.8 ?-Follow-up with CBC ?-Advised patient to avoid foods that upset her stomach ?-Will hold her Eliquis until her CBC result is back  ?-Encouraged follow up with GI doctor within a week ?-Discussed strict return precaution which patient and daughter verbalized understanding. ? ?Follow-up in 1-2 weeks ? ? ? ?[name], MD ?[place]  ? ? ?

## 2021-04-21 NOTE — Assessment & Plan Note (Deleted)
Patient appears hypotensive today with BP of 118/49.  She reports some hypotensive readings with orthostasis at home.  Her BP is usually much improved when she takes midodrine 3 times daily.  Informed patient midodrine dose is 10 mg 3 times daily moving forward. Recommended she keeps blood pressure diary which will be reviewed in her follow-up visit.  Fall precaution discussed with family who verbalized understanding. ?

## 2021-04-22 ENCOUNTER — Other Ambulatory Visit: Payer: Self-pay | Admitting: Student

## 2021-04-22 ENCOUNTER — Telehealth: Payer: Self-pay | Admitting: Student

## 2021-04-22 LAB — CBC
Hematocrit: 27.8 % — ABNORMAL LOW (ref 34.0–46.6)
Hemoglobin: 8 g/dL — ABNORMAL LOW (ref 11.1–15.9)
MCH: 22.9 pg — ABNORMAL LOW (ref 26.6–33.0)
MCHC: 28.8 g/dL — ABNORMAL LOW (ref 31.5–35.7)
MCV: 80 fL (ref 79–97)
Platelets: 283 10*3/uL (ref 150–450)
RBC: 3.49 x10E6/uL — ABNORMAL LOW (ref 3.77–5.28)
RDW: 18.6 % — ABNORMAL HIGH (ref 11.7–15.4)
WBC: 7.3 10*3/uL (ref 3.4–10.8)

## 2021-04-22 NOTE — Progress Notes (Deleted)
Called [name], Mrs Heinz Knuckles (primary care giver for patient) to inform her that Demyah's CBC showed Hgb of 8.0 and so she doesn't need transfusion at this time. Daughter expressed Marithza has tried iron supplement in the past but it made her sick and so it was discontinued. Being they patient is borderline at her threshold for transfusion and with recent report of bleeding around the ostomy bag I advised daughter that they continue to hold Eliquis until next week when they see Dr. Caron Presume for reassessment on when to start the Eliquis.  ?

## 2021-04-22 NOTE — Telephone Encounter (Signed)
Called [name], Mrs [name] (primary care giver for patient) to inform her that [name]'s CBC showed Hgb of 8.0 and so she doesn't need transfusion at this time. Daughter expressed [name] has tried iron supplement in the past but it made her sick and so it was discontinued. Being they patient is borderline at her threshold for transfusion and with recent report of bleeding around the ostomy bag I advised daughter that they continue to hold Eliquis until next week when they see Dr. Caron Presume for reassessment on when to start the Eliquis.  ?

## 2021-05-03 ENCOUNTER — Encounter (HOSPITAL_COMMUNITY): Payer: Self-pay | Admitting: Emergency Medicine

## 2021-05-03 ENCOUNTER — Emergency Department (HOSPITAL_COMMUNITY)
Admission: EM | Admit: 2021-05-03 | Discharge: 2021-05-03 | Disposition: A | Discharge status: Home / Self Care | Payer: Medicare HMO | Attending: Emergency Medicine | Admitting: Emergency Medicine

## 2021-05-03 DIAGNOSIS — D631 Anemia in chronic kidney disease: Secondary | ICD-10-CM | POA: Diagnosis not present

## 2021-05-03 DIAGNOSIS — Z7901 Long term (current) use of anticoagulants: Secondary | ICD-10-CM | POA: Diagnosis not present

## 2021-05-03 DIAGNOSIS — N189 Chronic kidney disease, unspecified: Secondary | ICD-10-CM | POA: Diagnosis not present

## 2021-05-03 DIAGNOSIS — D649 Anemia, unspecified: Secondary | ICD-10-CM | POA: Diagnosis present

## 2021-05-03 DIAGNOSIS — Z91040 Latex allergy status: Secondary | ICD-10-CM | POA: Diagnosis not present

## 2021-05-03 DIAGNOSIS — Z992 Dependence on renal dialysis: Secondary | ICD-10-CM | POA: Insufficient documentation

## 2021-05-03 DIAGNOSIS — Z79899 Other long term (current) drug therapy: Secondary | ICD-10-CM | POA: Diagnosis not present

## 2021-05-03 LAB — CBC
HCT: 27.9 % — ABNORMAL LOW (ref 36.0–46.0)
Hemoglobin: 7.7 g/dL — ABNORMAL LOW (ref 12.0–15.0)
MCH: 22.6 pg — ABNORMAL LOW (ref 26.0–34.0)
MCHC: 27.6 g/dL — ABNORMAL LOW (ref 30.0–36.0)
MCV: 81.8 fL (ref 80.0–100.0)
Platelets: 247 10*3/uL (ref 150–400)
RBC: 3.41 MIL/uL — ABNORMAL LOW (ref 3.87–5.11)
RDW: 18.4 % — ABNORMAL HIGH (ref 11.5–15.5)
WBC: 7.3 10*3/uL (ref 4.0–10.5)
nRBC: 0 % (ref 0.0–0.2)

## 2021-05-03 LAB — COMPREHENSIVE METABOLIC PANEL
ALT: 15 U/L (ref 0–44)
AST: 31 U/L (ref 15–41)
Albumin: 2.4 g/dL — ABNORMAL LOW (ref 3.5–5.0)
Alkaline Phosphatase: 81 U/L (ref 38–126)
Anion gap: 11 (ref 5–15)
BUN: 7 mg/dL — ABNORMAL LOW (ref 8–23)
CO2: 26 mmol/L (ref 22–32)
Calcium: 8.4 mg/dL — ABNORMAL LOW (ref 8.9–10.3)
Chloride: 99 mmol/L (ref 98–111)
Creatinine, Ser: 4.28 mg/dL — ABNORMAL HIGH (ref 0.44–1.00)
GFR, Estimated: 10 mL/min — ABNORMAL LOW (ref >60)
Glucose, Bld: 154 mg/dL — ABNORMAL HIGH (ref 70–99)
Potassium: 3.3 mmol/L — ABNORMAL LOW (ref 3.5–5.1)
Sodium: 136 mmol/L (ref 135–145)
Total Bilirubin: 1 mg/dL (ref 0.3–1.2)
Total Protein: 5.9 g/dL — ABNORMAL LOW (ref 6.5–8.1)

## 2021-05-03 NOTE — ED Provider Notes (Signed)
?[facility] ?Provider Note ? ? ?CSN: [id number] ?Arrival date & time: 05/03/21  1528 ? ?  ? ?History ? ?Chief Complaint  ?Patient presents with  ? Anemia  ? ? ?[name] is a 81 y.o. female. ? ?Patient here as concern for possible anemia.  History of chronic kidney disease.  Hemoglobin supposedly on Thursday was 6.3.  Had dialysis today and was sent here for evaluation.  She has ostomy from prior abdominal surgery.  She has not had any LOC stools or bloody stools.  Her Eliquis has been held due to some superficial bleeding around her ostomy site.  She has not had any bleeding.  She has been on midodrine now for low blood pressures in the past.  Blood pressures are particularly low on dialysis days and normal for her is above 80 systolic.  Denies any chest pain abdominal pain or other concerning symptoms. ? ?The history is provided by the patient.  ?Anemia ?This is a new problem. The problem occurs constantly. The problem has not changed since onset.Pertinent negatives include no chest pain, no abdominal pain, no headaches and no shortness of breath. Nothing relieves the symptoms. She has tried nothing for the symptoms. The treatment provided no relief.  ? ?  ? ?Home Medications ?Prior to Admission medications   ?Medication Sig Start Date End Date Taking? Authorizing Provider  ?acetaminophen (TYLENOL) 500 MG tablet Take 0.5 tablets (250 mg total) by mouth every 6 (six) hours as needed for mild pain (pain). ?Patient taking differently: Take 1,000 mg by mouth daily as needed for mild pain (pain). 11/22/16   Aline August, MD  ?albuterol (VENTOLIN HFA) 108 (90 Base) MCG/ACT inhaler Inhale 1-2 puffs into the lungs every 6 (six) hours as needed for wheezing or shortness of breath.    [provider]  ?amiodarone (PACERONE) 200 MG tablet TAKE 1 TABLET EVERY DAY 03/26/21   [name], Peter M, MD  ?apixaban (ELIQUIS) 2.5 MG TABS tablet Take 1 tablet (2.5 mg total) by mouth 2  (two) times daily. 03/21/21   Erskine Emery, MD  ?calcitRIOL (ROCALTROL) 0.5 MCG capsule Take 2 capsules (1 mcg total) by mouth Every Tuesday,Thursday,and Saturday with dialysis. 02/20/21   Lenoria Chime, MD  ?cetirizine (ZYRTEC) 10 MG tablet Take 10 mg by mouth daily.     [provider]  ?clonazePAM (KLONOPIN) 1 MG tablet Take 0.5-1 mg by mouth See admin instructions. Take 1 mg by mouth at bedtime on Sun/Mon/Wed/Fri and 0.5 mg at bedtime on Tues/Thurs/Sat- may take an additional 0.5-1 mg up to two (more) times a day as needed for anxiety (total combined daily dose is a max of 3 milligrams)    [provider]  ?estradiol (ESTRACE) 1 MG tablet Take 1 mg by mouth daily.    [provider]  ?famotidine (PEPCID) 20 MG tablet Take 20 mg by mouth in the morning.    [provider]  ?fluticasone (FLONASE) 50 MCG/ACT nasal spray Place 1 spray into both nostrils daily. 10/25/19   [provider]  ?levalbuterol Penne Lash) 1.25 MG/3ML nebulizer solution Take 1.25 mg by nebulization 2 (two) times daily as needed for wheezing or shortness of breath. 02/17/21   [provider]  ?midodrine (PROAMATINE) 10 MG tablet Take 1 tablet (10 mg total) by mouth 3 (three) times daily. 04/21/21 07/20/21  Lenoria Chime, MD  ?multivitamin (RENA-VIT) TABS tablet Take 1 tablet by mouth at bedtime.    [provider]  ?  Nutritional Supplements (,FEEDING SUPPLEMENT, PROSOURCE PLUS) liquid Take 30 mLs by mouth 2 (two) times daily between meals. 03/21/21   Erskine Emery, MD  ?promethazine (PHENERGAN) 12.5 MG tablet Take 12.5 mg by mouth every 6 (six) hours as needed for nausea or vomiting.    [provider]  ?sevelamer (RENAGEL) 800 MG tablet Take 1 tablet (800 mg total) by mouth 3 (three) times daily with meals. 03/24/21   [name], Peter M, MD  ?SYNTHROID 175 MCG tablet Take 175 mcg by mouth daily before breakfast. 08/14/19   [provider]  ?TRUE METRIX BLOOD GLUCOSE  TEST test strip  06/29/19   [provider]  ?TRUEplus Lancets 33G [place]  06/29/19   [provider]  ?vitamin B-12 (CYANOCOBALAMIN) 500 MCG tablet Take 500 mcg by mouth daily.    [provider]  ?   ? ?Allergies    ?Adhesive [tape], Avelox [moxifloxacin], Banana, Blueberry flavor, Cantaloupe extract allergy skin test, Cefprozil, Cetacaine [butamben-tetracaine-benzocaine], Dicyclomine, Food, Imdur [isosorbide nitrate], Januvia [sitagliptin], Lipitor [atorvastatin], Losartan potassium, Nitroglycerin, Omeprazole, Oxycodone, Penicillins, Prednisone, Vancomycin, Watermelon [citrullus vulgaris], Hydrocodone, Latex, Tamiflu [oseltamivir], Feraheme [ferumoxytol], Other, Lasix [furosemide], and Mupirocin   ? ?Review of Systems   ?Review of Systems  ?Respiratory:  Negative for shortness of breath.   ?Cardiovascular:  Negative for chest pain.  ?Gastrointestinal:  Negative for abdominal pain.  ?Neurological:  Negative for headaches.  ? ?Physical Exam ?Updated Vital Signs ?BP (!) 105/52   Pulse 65   Temp (!) 97.4 ?F (36.3 ?C) (Oral)   Resp 18   SpO2 100%  ?Physical Exam ?Vitals and nursing note reviewed.  ?Constitutional:   ?   General: She is not in acute distress. ?   Appearance: She is well-developed.  ?HENT:  ?   Head: Normocephalic and atraumatic.  ?Eyes:  ?   Conjunctiva/sclera: Conjunctivae normal.  ?Cardiovascular:  ?   Rate and Rhythm: Normal rate and regular rhythm.  ?   Heart sounds: No murmur heard. ?Pulmonary:  ?   Effort: Pulmonary effort is normal. No respiratory distress.  ?   Breath sounds: Normal breath sounds.  ?Abdominal:  ?   Palpations: Abdomen is soft.  ?   Tenderness: There is no abdominal tenderness.  ?   Comments: Brown stool in ostomy bag, no bleeding around ostomy site  ?Musculoskeletal:     ?   General: No swelling.  ?   Cervical back: Neck supple.  ?Skin: ?   General: Skin is warm and dry.  ?   Capillary Refill: Capillary refill takes less than 2 seconds.  ?Neurological:   ?   Mental Status: She is alert.  ?Psychiatric:     ?   Mood and Affect: Mood normal.  ? ? ?ED Results / Procedures / Treatments   ?Labs ?(all labs ordered are listed, but only abnormal results are displayed) ?Labs Reviewed  ?COMPREHENSIVE METABOLIC PANEL - Abnormal; Notable for the following components:  ?    Result Value  ? Potassium 3.3 (*)   ? Glucose, Bld 154 (*)   ? BUN 7 (*)   ? Creatinine, Ser 4.28 (*)   ? Calcium 8.4 (*)   ? Total Protein 5.9 (*)   ? Albumin 2.4 (*)   ? GFR, Estimated 10 (*)   ? All other components within normal limits  ?CBC - Abnormal; Notable for the following components:  ? RBC 3.41 (*)   ? Hemoglobin 7.7 (*)   ? HCT 27.9 (*)   ?  MCH 22.6 (*)   ? MCHC 27.6 (*)   ? RDW 18.4 (*)   ? All other components within normal limits  ?TYPE AND SCREEN  ? ? ?EKG ?None ? ?Radiology ?No results found. ? ?Procedures ?Procedures  ? ? ?Medications Ordered in ED ?Medications - No data to display ? ?ED Course/ Medical Decision Making/ A&P ?  ?                        ?Medical Decision Making ?Amount and/or Complexity of Data Reviewed ?Labs: ordered. ? ? ?[name] is here for evaluation for possible anemia.  Patient with unremarkable vitals.  Initial blood pressure was 82 systolic but on recheck is 105/52.  Blood pressure typically in the 80s on dialysis days.  She just had dialysis prior to coming here.  She is on midodrine now and not due for her next dose till later tonight.  She has no active bleeding.  No nausea or vomiting or abdominal pain.  She has a prior ostomy and there is no melena or hematochezia in her ostomy pouch.  She does have A-fib history but not on anticoagulation at this time.  She appears well.  Blood work per my review and evaluation is unremarkable.  Hemoglobin is 7.7.  This is her baseline.  Otherwise no significant electrolyte abnormalities.  Overall no need for transfusion at this time.  She is hemodynamically stable.  She is asymptomatic.  We will have her follow-up with  primary care doctor.  Discharged in good condition. ? ?This chart was dictated using voice recognition software.  Despite best efforts to proofread,  errors can occur which can change the documentation mean

## 2021-05-03 NOTE — ED Triage Notes (Signed)
Patient sent to ED by dialysis center for concern of anemia, reported hemoglobin from blood draw on Thursday result was 6.2. Patient denies known source of bleeding, is alert, oriented, pale-appearing, and in no apparent distress at this time. ?

## 2021-05-03 NOTE — ED Notes (Addendum)
Lying: 93/56 ?             62 ?

## 2021-05-03 NOTE — Discharge Instructions (Signed)
No need for blood transfusion today as discussed.  Follow-up with your primary care doctor.  Please return if you notice any blood in your ostomy pouch. ?

## 2021-05-05 ENCOUNTER — Emergency Department (HOSPITAL_COMMUNITY)
Admission: EM | Admit: 2021-05-05 | Discharge: 2021-05-06 | Disposition: A | Discharge status: Home / Self Care | Payer: Medicare HMO | Attending: Emergency Medicine | Admitting: Emergency Medicine

## 2021-05-05 ENCOUNTER — Ambulatory Visit (INDEPENDENT_AMBULATORY_CARE_PROVIDER_SITE_OTHER): Payer: Medicare HMO | Admitting: Family Medicine

## 2021-05-05 ENCOUNTER — Encounter: Payer: Self-pay | Admitting: Family Medicine

## 2021-05-05 ENCOUNTER — Encounter (HOSPITAL_COMMUNITY): Payer: Self-pay | Admitting: Emergency Medicine

## 2021-05-05 ENCOUNTER — Other Ambulatory Visit: Payer: Self-pay

## 2021-05-05 VITALS — BP 102/60 | HR 60 | Temp 97.7°F | Ht 62.0 in | Wt 199.2 lb

## 2021-05-05 DIAGNOSIS — N186 End stage renal disease: Secondary | ICD-10-CM | POA: Diagnosis not present

## 2021-05-05 DIAGNOSIS — E1122 Type 2 diabetes mellitus with diabetic chronic kidney disease: Secondary | ICD-10-CM | POA: Insufficient documentation

## 2021-05-05 DIAGNOSIS — R7989 Other specified abnormal findings of blood chemistry: Secondary | ICD-10-CM | POA: Diagnosis not present

## 2021-05-05 DIAGNOSIS — I959 Hypotension, unspecified: Secondary | ICD-10-CM

## 2021-05-05 DIAGNOSIS — I2510 Atherosclerotic heart disease of native coronary artery without angina pectoris: Secondary | ICD-10-CM | POA: Diagnosis not present

## 2021-05-05 DIAGNOSIS — D649 Anemia, unspecified: Secondary | ICD-10-CM | POA: Insufficient documentation

## 2021-05-05 DIAGNOSIS — E039 Hypothyroidism, unspecified: Secondary | ICD-10-CM | POA: Diagnosis not present

## 2021-05-05 DIAGNOSIS — I4891 Unspecified atrial fibrillation: Secondary | ICD-10-CM | POA: Insufficient documentation

## 2021-05-05 DIAGNOSIS — N189 Chronic kidney disease, unspecified: Secondary | ICD-10-CM | POA: Diagnosis not present

## 2021-05-05 DIAGNOSIS — Z7901 Long term (current) use of anticoagulants: Secondary | ICD-10-CM | POA: Insufficient documentation

## 2021-05-05 DIAGNOSIS — D631 Anemia in chronic kidney disease: Secondary | ICD-10-CM

## 2021-05-05 DIAGNOSIS — I120 Hypertensive chronic kidney disease with stage 5 chronic kidney disease or end stage renal disease: Secondary | ICD-10-CM | POA: Diagnosis not present

## 2021-05-05 DIAGNOSIS — Z79899 Other long term (current) drug therapy: Secondary | ICD-10-CM | POA: Insufficient documentation

## 2021-05-05 DIAGNOSIS — Z91040 Latex allergy status: Secondary | ICD-10-CM | POA: Insufficient documentation

## 2021-05-05 LAB — CBC WITH DIFFERENTIAL/PLATELET
Abs Immature Granulocytes: 0.03 10*3/uL (ref 0.00–0.07)
Basophils Absolute: 0.1 10*3/uL (ref 0.0–0.1)
Basophils Relative: 1 %
Eosinophils Absolute: 0 10*3/uL (ref 0.0–0.5)
Eosinophils Relative: 0 %
HCT: 26.2 % — ABNORMAL LOW (ref 36.0–46.0)
Hemoglobin: 7.2 g/dL — ABNORMAL LOW (ref 12.0–15.0)
Immature Granulocytes: 0 %
Lymphocytes Relative: 33 %
Lymphs Abs: 2.3 10*3/uL (ref 0.7–4.0)
MCH: 22.1 pg — ABNORMAL LOW (ref 26.0–34.0)
MCHC: 27.5 g/dL — ABNORMAL LOW (ref 30.0–36.0)
MCV: 80.4 fL (ref 80.0–100.0)
Monocytes Absolute: 1 10*3/uL (ref 0.1–1.0)
Monocytes Relative: 14 %
Neutro Abs: 3.6 10*3/uL (ref 1.7–7.7)
Neutrophils Relative %: 52 %
Platelets: 352 10*3/uL (ref 150–400)
RBC: 3.26 MIL/uL — ABNORMAL LOW (ref 3.87–5.11)
RDW: 18.6 % — ABNORMAL HIGH (ref 11.5–15.5)
WBC: 7 10*3/uL (ref 4.0–10.5)
nRBC: 0 % (ref 0.0–0.2)

## 2021-05-05 LAB — COMPREHENSIVE METABOLIC PANEL
ALT: 14 U/L (ref 0–44)
AST: 30 U/L (ref 15–41)
Albumin: 2.6 g/dL — ABNORMAL LOW (ref 3.5–5.0)
Alkaline Phosphatase: 90 U/L (ref 38–126)
Anion gap: 11 (ref 5–15)
BUN: 19 mg/dL (ref 8–23)
CO2: 27 mmol/L (ref 22–32)
Calcium: 9.1 mg/dL (ref 8.9–10.3)
Chloride: 99 mmol/L (ref 98–111)
Creatinine, Ser: 7.34 mg/dL — ABNORMAL HIGH (ref 0.44–1.00)
GFR, Estimated: 5 mL/min — ABNORMAL LOW (ref >60)
Glucose, Bld: 176 mg/dL — ABNORMAL HIGH (ref 70–99)
Potassium: 3.4 mmol/L — ABNORMAL LOW (ref 3.5–5.1)
Sodium: 137 mmol/L (ref 135–145)
Total Bilirubin: 0.5 mg/dL (ref 0.3–1.2)
Total Protein: 6 g/dL — ABNORMAL LOW (ref 6.5–8.1)

## 2021-05-05 LAB — CBC
Hematocrit: 23.3 % — ABNORMAL LOW (ref 34.0–46.6)
Hemoglobin: 6.7 g/dL — CL (ref 11.1–15.9)
MCH: 21.6 pg — ABNORMAL LOW (ref 26.6–33.0)
MCHC: 28.8 g/dL — ABNORMAL LOW (ref 31.5–35.7)
MCV: 75 fL — ABNORMAL LOW (ref 79–97)
Platelets: 351 10*3/uL (ref 150–450)
RBC: 3.1 x10E6/uL — ABNORMAL LOW (ref 3.77–5.28)
RDW: 20.1 % — ABNORMAL HIGH (ref 11.7–15.4)
WBC: 6.2 10*3/uL (ref 3.4–10.8)

## 2021-05-05 LAB — POCT HEMOGLOBIN: Hemoglobin: 6.4 g/dL — AB (ref 11–14.6)

## 2021-05-05 LAB — TYPE AND SCREEN
ABO/RH(D): A POS
Antibody Screen: NEGATIVE

## 2021-05-05 LAB — PREPARE RBC (CROSSMATCH)

## 2021-05-05 MED ORDER — APIXABAN 2.5 MG PO TABS: 2.5000 mg | ORAL_TABLET | Freq: Two times a day (BID) | ORAL | 0 refills | Status: DC

## 2021-05-05 MED ORDER — SODIUM CHLORIDE 0.9 % IV SOLN
10.0000 mL/h | Freq: Once | INTRAVENOUS | Status: AC
  Administered 2021-05-06: 10 mL/h via INTRAVENOUS

## 2021-05-05 NOTE — ED Provider Notes (Signed)
?[facility] ?Provider Note ? ? ?CSN: [id number] ?Arrival date & time: 05/05/21  1445 ? ?  ? ?History ? ?Chief Complaint  ?Patient presents with  ? Abnormal Lab  ? ? ?[name] is a 81 y.o. female with a past medical history of end-stage renal disease on dialysis, A-fib, hypertension, CAD, IBS and current ostomy in place presenting for a blood transfusion.  Patient reports that this morning she was seen by her PCP and her hemoglobin was found to be 6.7.  She has been fatigued but denies any dizziness, palpitations or shortness of breath.  Says she has not noted any blood in her ostomy bag.  She was on Eliquis for her A-fib however this has been held due to her low hemoglobin. ? ?Patient was also seen 2 days ago for the same thing however her hemoglobin was above 7 at that time and transfusion was not initiated.  Patient reports she has not missed any dialysis sessions and is scheduled to go tomorrow. ? ?  ? ?Home Medications ?Prior to Admission medications   ?Medication Sig Start Date End Date Taking? Authorizing Provider  ?acetaminophen (TYLENOL) 500 MG tablet Take 0.5 tablets (250 mg total) by mouth every 6 (six) hours as needed for mild pain (pain). ?Patient taking differently: Take 1,000 mg by mouth daily as needed for mild pain (pain). 11/22/16   Aline August, MD  ?albuterol (VENTOLIN HFA) 108 (90 Base) MCG/ACT inhaler Inhale 1-2 puffs into the lungs every 6 (six) hours as needed for wheezing or shortness of breath.    [provider]  ?amiodarone (PACERONE) 200 MG tablet TAKE 1 TABLET EVERY DAY 03/26/21   [name], Peter M, MD  ?apixaban (ELIQUIS) 2.5 MG TABS tablet Take 1 tablet (2.5 mg total) by mouth 2 (two) times daily. 05/05/21   Gifford Shave, MD  ?calcitRIOL (ROCALTROL) 0.5 MCG capsule Take 2 capsules (1 mcg total) by mouth Every Tuesday,Thursday,and Saturday with dialysis. 02/20/21   Lenoria Chime, MD  ?cetirizine (ZYRTEC) 10 MG tablet Take 10 mg  by mouth daily.     [provider]  ?clonazePAM (KLONOPIN) 1 MG tablet Take 0.5-1 mg by mouth See admin instructions. Take 1 mg by mouth at bedtime on Sun/Mon/Wed/Fri and 0.5 mg at bedtime on Tues/Thurs/Sat- may take an additional 0.5-1 mg up to two (more) times a day as needed for anxiety (total combined daily dose is a max of 3 milligrams)    [provider]  ?estradiol (ESTRACE) 1 MG tablet Take 1 mg by mouth daily.    [provider]  ?famotidine (PEPCID) 20 MG tablet Take 20 mg by mouth in the morning.    [provider]  ?fluticasone (FLONASE) 50 MCG/ACT nasal spray Place 1 spray into both nostrils daily. 10/25/19   [provider]  ?levalbuterol Penne Lash) 1.25 MG/3ML nebulizer solution Take 1.25 mg by nebulization 2 (two) times daily as needed for wheezing or shortness of breath. 02/17/21   [provider]  ?midodrine (PROAMATINE) 10 MG tablet Take 1 tablet (10 mg total) by mouth 3 (three) times daily. 04/21/21 07/20/21  Lenoria Chime, MD  ?multivitamin (RENA-VIT) TABS tablet Take 1 tablet by mouth at bedtime.    [provider]  ?Nutritional Supplements (,FEEDING SUPPLEMENT, PROSOURCE PLUS) liquid Take 30 mLs by mouth 2 (two) times daily between meals. 03/21/21   Erskine Emery, MD  ?promethazine (PHENERGAN) 12.5 MG tablet Take 12.5 mg by mouth every 6 (six) hours as  needed for nausea or vomiting.    [provider]  ?sevelamer (RENAGEL) 800 MG tablet Take 1 tablet (800 mg total) by mouth 3 (three) times daily with meals. 03/24/21   [name], Peter M, MD  ?SYNTHROID 175 MCG tablet Take 175 mcg by mouth daily before breakfast. 08/14/19   [provider]  ?TRUE METRIX BLOOD GLUCOSE TEST test strip  06/29/19   [provider]  ?TRUEplus Lancets 33G [place]  06/29/19   [provider]  ?vitamin B-12 (CYANOCOBALAMIN) 500 MCG tablet Take 500 mcg by mouth daily.    [provider]  ?   ? ?Allergies    ?Adhesive [tape],  Avelox [moxifloxacin], Banana, Blueberry flavor, Cantaloupe extract allergy skin test, Cefprozil, Cetacaine [butamben-tetracaine-benzocaine], Dicyclomine, Food, Imdur [isosorbide nitrate], Januvia [sitagliptin], Lipitor [atorvastatin], Losartan potassium, Nitroglycerin, Omeprazole, Oxycodone, Penicillins, Prednisone, Vancomycin, Watermelon [citrullus vulgaris], Hydrocodone, Latex, Tamiflu [oseltamivir], Feraheme [ferumoxytol], Other, Lasix [furosemide], and Mupirocin   ? ?Review of Systems   ?Review of Systems ? ?Physical Exam ?Updated Vital Signs ?BP (!) 111/49 (BP Location: Right Arm)   Pulse (!) 59   Temp 97.9 ?F (36.6 ?C)   Resp 17   SpO2 100%  ?Physical Exam ?Vitals and nursing note reviewed.  ?Constitutional:   ?   Appearance: Normal appearance.  ?HENT:  ?   Head: Normocephalic and atraumatic.  ?Eyes:  ?   General: No scleral icterus. ?   Conjunctiva/sclera: Conjunctivae normal.  ?Cardiovascular:  ?   Rate and Rhythm: Normal rate.  ?Pulmonary:  ?   Effort: Pulmonary effort is normal. No respiratory distress.  ?Abdominal:  ?   General: Abdomen is flat.  ?   Palpations: Abdomen is soft.  ?   Comments: Ostomy bag in place, does not appear infected.  No bright red blood or melena in bag.  ?Skin: ?   General: Skin is warm and dry.  ?   Coloration: Skin is pale. Skin is not jaundiced.  ?   Findings: No rash.  ?Neurological:  ?   Mental Status: She is alert.  ?Psychiatric:     ?   Mood and Affect: Mood normal.  ? ? ?ED Results / Procedures / Treatments   ?Labs ?(all labs ordered are listed, but only abnormal results are displayed) ?Labs Reviewed  ?CBC WITH DIFFERENTIAL/PLATELET - Abnormal; Notable for the following components:  ?    Result Value  ? RBC 3.26 (*)   ? Hemoglobin 7.2 (*)   ? HCT 26.2 (*)   ? MCH 22.1 (*)   ? MCHC 27.5 (*)   ? RDW 18.6 (*)   ? All other components within normal limits  ?COMPREHENSIVE METABOLIC PANEL - Abnormal; Notable for the following components:  ? Potassium 3.4 (*)   ? Glucose,  Bld 176 (*)   ? Creatinine, Ser 7.34 (*)   ? Total Protein 6.0 (*)   ? Albumin 2.6 (*)   ? GFR, Estimated 5 (*)   ? All other components within normal limits  ?TYPE AND SCREEN  ?PREPARE RBC (CROSSMATCH)  ? ? ?EKG ?None ? ?Radiology ?No results found. ? ?Procedures ?Procedures  ? ?Medications Ordered in ED ?Medications  ?0.9 %  sodium chloride infusion (has no administration in time range)  ? ? ?ED Course/ Medical Decision Making/ A&P ?  ?                        ?Medical Decision Making ?Amount and/or Complexity of Data Reviewed ?Labs:  ordered. ? ?Risk ?Prescription drug management. ? ? ?Patient presents to the ED for concern of anemia.  Causes for differential include upper GI bleed, lower GI bleed, diverticulitis, ESRD and bone marrow malignancy ? ?Co morbidities that complicate the patient evaluation include: ESRD on dialysis ? ?Per internal/external chart review: Patient has been seen for weakness and anemia multiple times this year. ? ? ?I performed a full physical exam, pertinent findings include: ?Normal PE ? ?Diagnostics: ? ?I ordered and viewed labs. The pertinent results include:  ?Creatinine 7.34, appears to be baseline the day prior to her dialysis. ?Hemoglobin 7.2 ?Albumin 2.6, appears to be baseline ?Normal potassium ? ? ?Consultations Obtained: ? ?I consulted Dr. Carolin Sicks with nephrology who suggested transfusion 2 units of blood.  He believes patient is likely stable to be discharged home for dialysis tomorrow. ? ? ?Treatment: ? ?I ordered blood for patient's symptomatic anemia. ? ? ?MDM/Disposition: ? ? ?81 year old female presenting for symptomatic anemia.  Has dialysis scheduled for tomorrow.  I spoke with nephrology who suggested we transfused 2 units of PRBCs.  I consented the patient at the bedside and her daughter is present.  At this time, blood transfusion is ongoing.  Patient be signed out to Dr. Sherry Ruffing for continuation of care and likely dc after transfusion. ? ? ?Final Clinical  Impression(s) / ED Diagnoses ?Final diagnoses:  ?Symptomatic anemia  ? ?  ?Rhae Hammock, PA-C ?05/05/21 1829 ? ?  ?Tegeler, Gwenyth Allegra, MD ?05/05/21 2345 ? ?

## 2021-05-05 NOTE — ED Provider Notes (Signed)
7:17 PM ?Care assumed from [place], PA-C.  At time of transfer of care, patient is waiting for 2 units of blood to be transfused and then patient's will be discharged home to go to dialysis tomorrow.  This is the recommendation of nephrology after they were called for the patient's waxing and waning low hemoglobins with her fatigue recently. ? ?Anticipate reassessment and discharge after blood is transfused. ?  ?Alveta Quintela, Gwenyth Allegra, MD ?05/06/21 5170 ? ?

## 2021-05-05 NOTE — Assessment & Plan Note (Signed)
Normotensive today on midodrine 3 times daily.  Recommended continuing midodrine 3 times daily at this time.  Discussed fall precautions and patient reports she would do her best and have assistance when needed. ?

## 2021-05-05 NOTE — ED Triage Notes (Signed)
Patient coming from dr, states hemoglobin is low and needs a blood transfusion. ?

## 2021-05-05 NOTE — Patient Instructions (Signed)
It was a pleasure seeing you today.  I am glad you are doing okay but sorry you are having these issues with low blood pressure.  I want you to continue the midodrine 10 mg 3 times daily.  We are checking her blood levels today.  We discussed at length the risks and benefits of restarting your blood thinner medication (Eliquis) and we have decided to restart the medication.  I sent a prescription for this to your pharmacy.  If you notice any bleeding in your ostomy please stop the medication and be seen immediately.  If you have any questions or concerns please call the clinic.  I hope you have a wonderful day! ? ? ?

## 2021-05-05 NOTE — ED Provider Triage Note (Signed)
Emergency Medicine Provider Triage Evaluation Note ? ?[name] , a 81 y.o. female  was evaluated in triage.  Pt complains of low hgb level. Pt saw her PCP and her hgb was 6.7 and was told she needs a blood transfusion. Seen in the ED on 04/08 for same - hgb 7.7 at that time however it was her baseline. Denies any black tarry stools recently. Last dialyzed Saturday.  ? ?Review of Systems  ?Positive: + low hgb ?Negative: - black tarry stools, vomiting blood ? ?Physical Exam  ?BP (!) 111/49   Pulse 61   Temp 97.9 ?F (36.6 ?C)   Resp 18   SpO2 100%  ?Gen:   Awake, no distress   ?Resp:  Normal effort  ?MSK:   Moves extremities without difficulty  ?Other:   ? ?Medical Decision Making  ?Medically screening exam initiated at 3:38 PM.  Appropriate orders placed.  [name] was informed that the remainder of the evaluation will be completed by another provider, this initial triage assessment does not replace that evaluation, and the importance of remaining in the ED until their evaluation is complete. ? ? ?  ?[name], PA-C ?05/05/21 1541 ? ?

## 2021-05-05 NOTE — Progress Notes (Addendum)
? ? ?  SUBJECTIVE:  ? ?CHIEF COMPLAINT / HPI:  ? ?Anemia ?Patient presents today for a follow-up on her anemia.  She denies any known bleeding although reports that last week she went to dialysis and had blood drawn prior to her dialysis session on Thursday.  She got a call on Saturday that she needed to go to the emergency department for blood transfusion so she went.  She went after her dialysis session on Saturday.  Patient's hemoglobin was 7.7 in the ED so she did not receive a transfusion.   ? ?Hypotension ?Patient continues to have issues with hypotension.  Currently on midodrine 10 mg 3 times a day.  Her blood pressures have improved since starting the midodrine but she noticed postural hypotension per her palliative care nurse where her systolic pressure fell into the 60s.  Denies any syncopal events or falls. ? ?OBJECTIVE:  ? ?BP 102/60   Pulse 60   Temp 97.7 ?F (36.5 ?C)   Ht '5\' 2"'$  (1.575 m)   Wt 199 lb 3.2 oz (90.4 kg)   SpO2 100%   BMI 36.43 kg/m?   ?General: Chronically ill-appearing 81 year old female in no acute distress ?Cardiac: Regular rate ?Respiratory: Normal work of breathing, speaking full sentences ?MSK: No gross abnormalities ? ?ASSESSMENT/PLAN:  ? ?Anemia of renal disease ?Had a long discussion with patient regarding her anemia.  POC hemoglobin of 6.2.  Collected venous sample which came out at 6.7 which is below her transfusion threshold of of 8.  Transfusion threshold of 8 due to CAD.  Feel that the reason she did not require transfusion on Saturday was that she was postdialysis and hemoconcentrated.  Spoke with patient's palliative care nurse who called her dialysis center to see if she could receive blood during dialysis tomorrow.  The dialysis center reported that they do not do transfusions and that she would need to go somewhere else for transfusion.  Spoke with the family regarding the need for transfusion and feel that her fluctuations have to do with her volume status so she  may be above transfusion threshold after her sessions but below prior to the sessions.  She is fatigued and weak and may benefit from a transfusion so did recommend that she go to the emergency department for this.  In the future patient may need to be scheduled with infusion center for outpatient transfusions but not at this time. ? ?Regarding patient's anticoagulation had a long discussion with the patient regarding the risks of restarting the Eliquis versus holding.  Reported that part of it would have to do with how high her blood levels were.  Initially she wanted to restart the Eliquis at her renally adjusted dose.  After the results of her hemoglobin came back recommended that she continue to hold Eliquis and we can address this at a further later date.  Patient is agreeable to this plan and will follow-up in the near future. ? ?Arterial hypotension ?Normotensive today on midodrine 3 times daily.  Recommended continuing midodrine 3 times daily at this time.  Discussed fall precautions and patient reports she would do her best and have assistance when needed. ?  ? ? ?Gifford Shave, MD ?[place]  ? ?

## 2021-05-05 NOTE — Assessment & Plan Note (Addendum)
Had a long discussion with patient regarding her anemia.  POC hemoglobin of 6.2.  Collected venous sample which came out at 6.7 which is below her transfusion threshold of of 8.  Transfusion threshold of 8 due to CAD.  Feel that the reason she did not require transfusion on Saturday was that she was postdialysis and hemoconcentrated.  Spoke with patient's palliative care nurse who called her dialysis center to see if she could receive blood during dialysis tomorrow.  The dialysis center reported that they do not do transfusions and that she would need to go somewhere else for transfusion.  Spoke with the family regarding the need for transfusion and feel that her fluctuations have to do with her volume status so she may be above transfusion threshold after her sessions but below prior to the sessions.  She is fatigued and weak and may benefit from a transfusion so did recommend that she go to the emergency department for this.  In the future patient may need to be scheduled with infusion center for outpatient transfusions but not at this time. ? ?Regarding patient's anticoagulation had a long discussion with the patient regarding the risks of restarting the Eliquis versus holding.  Reported that part of it would have to do with how high her blood levels were.  Initially she wanted to restart the Eliquis at her renally adjusted dose.  After the results of her hemoglobin came back recommended that she continue to hold Eliquis and we can address this at a further later date.  Patient is agreeable to this plan and will follow-up in the near future. ?

## 2021-05-06 LAB — TYPE AND SCREEN
ABO/RH(D): A POS
Antibody Screen: NEGATIVE
Unit division: 0
Unit division: 0

## 2021-05-06 LAB — BPAM RBC
Blood Product Expiration Date: 202305062359
Blood Product Expiration Date: 202305062359
ISSUE DATE / TIME: 202304102004
ISSUE DATE / TIME: 202304102326
Unit Type and Rh: 6200
Unit Type and Rh: 6200

## 2021-05-06 NOTE — Discharge Instructions (Signed)
You were evaluated in the Emergency Department and after careful evaluation, we did not find any emergent condition requiring admission or further testing in the hospital. ? ?Your exam/testing today was overall reassuring.  We gave you a blood transfusion here in the emergency department.  Please follow-up closely with your regular doctors. ? ?Please return to the Emergency Department if you experience any worsening of your condition.  Thank you for allowing [place] to be a part of your care. ? ?

## 2021-05-06 NOTE — ED Provider Notes (Signed)
?  Provider Note ?MRN:  [id number]  ?Arrival date & time: 05/06/21    ?ED Course and Medical Decision Making  ?Assumed care from Dr. Kathrynn Humble at shift change. ? ?Symptomatic anemia, ESRD, case discussed with nephrology, plan is to transfuse 2 units and discharge home. ? ?Procedures ? ?Final Clinical Impressions(s) / ED Diagnoses  ? ?  ICD-10-CM   ?1. Symptomatic anemia  D64.9   ?  ?  ?ED Discharge Orders   ? ? None  ? ?  ?  ? ? ?Discharge Instructions   ? ?  ?You were evaluated in the Emergency Department and after careful evaluation, we did not find any emergent condition requiring admission or further testing in the hospital. ? ?Your exam/testing today was overall reassuring.  We gave you a blood transfusion here in the emergency department.  Please follow-up closely with your regular doctors. ? ?Please return to the Emergency Department if you experience any worsening of your condition.  Thank you for allowing [place] to be a part of your care. ? ? ? ? ? ? ?Barth Kirks. Sedonia Small, MD ?[facility] Emergency Medicine ?[email or web address] ? ?  ?Maudie Flakes, MD ?05/06/21 0250 ? ?

## 2021-05-16 ENCOUNTER — Ambulatory Visit (INDEPENDENT_AMBULATORY_CARE_PROVIDER_SITE_OTHER): Payer: Medicare HMO | Admitting: Student

## 2021-05-16 ENCOUNTER — Encounter: Payer: Self-pay | Admitting: Student

## 2021-05-16 VITALS — BP 119/45 | HR 61 | Ht 62.0 in | Wt 198.8 lb

## 2021-05-16 DIAGNOSIS — D631 Anemia in chronic kidney disease: Secondary | ICD-10-CM | POA: Diagnosis not present

## 2021-05-16 DIAGNOSIS — I959 Hypotension, unspecified: Secondary | ICD-10-CM

## 2021-05-16 DIAGNOSIS — N189 Chronic kidney disease, unspecified: Secondary | ICD-10-CM | POA: Diagnosis not present

## 2021-05-16 DIAGNOSIS — I480 Paroxysmal atrial fibrillation: Secondary | ICD-10-CM

## 2021-05-16 LAB — POCT HEMOGLOBIN: Hemoglobin: 9.3 g/dL — AB (ref 11–14.6)

## 2021-05-16 MED ORDER — APIXABAN 2.5 MG PO TABS: 2.5000 mg | ORAL_TABLET | Freq: Two times a day (BID) | ORAL | 0 refills | Status: DC

## 2021-05-16 NOTE — Progress Notes (Signed)
? ? ?  SUBJECTIVE:  ? ?CHIEF COMPLAINT / HPI: BP Follow Up ? ?Hypotension ?Patient was recently started on midodrine 3 times daily due to symptomatic hypotension.  Her BP diary shows still leaks 100-110s/50s-60s. Blood Pressure is improved and patient denies any orthostatic symptoms. ? ?Anemia ?Patient's Eliquis recently held due to active bleeding and anemia.  She reports no bleeding since February and denies SOB, dizziness, chest pain and endorses improvement in overage energy.  She expressed desire to restart her Eliquis. ? ?PERTINENT  PMH / PSH: CAD, ESRD on HD, T2DM, gastroparesis, A-fib on Eliquis. ? ?OBJECTIVE:  ? ?BP (!) 119/45   Pulse 61   Ht '5\' 2"'$  (1.575 m)   Wt 198 lb 12.8 oz (90.2 kg)   SpO2 100%   BMI 36.36 kg/m?   ? ?Physical Exam ?General: Alert, well appearing, NAD,  ?Cardiovascular: RRR, No Murmurs, Normal S2/S2 ?Respiratory: CTAB, No wheezing or Rales ?Abdomen: Colostomy bag placed to the right of the abdomen shows no active bleeding, drainage or skin changes. ?Extremities: No edema on extremities   ? ? ?ASSESSMENT/PLAN:  ? ?Arterial hypotension ?BP diary shows blood pressure improved to range of 100-110s/50s-60s.  She remains asymptomatic.  Recommend continuing midodrine 3 times daily and fall precautions reviewed. ? ?Anemia of renal disease ?Patient's hemoglobin today was 9.3.  He is currently asymptomatic and denies SOB, dizziness, chest pain or generalized fatigue.  Improved hemoglobin and the absence of any active bleeding recommend restarting patient's Eliquis which she takes for A-fib.  Discussed strict return precaution with patient who verbalized understanding. ? ?Follow-up in a month or sooner as needed. ?  ? ? ?[name], MD ?[place]  ? ? ?

## 2021-05-16 NOTE — Patient Instructions (Signed)
It was wonderful to see you today. Thank you for allowing me to be a part of your care. Below is a short summary of what we discussed at your visit today: ? ?Blood pressure looks much improved.  Continue midodrine 3 times daily. ? ?Your hemoglobin was 9.3 today.  It is safe to restart your Eliquis. ? ?Follow-up in a month or sooner if needed ? ? ?If you have any questions or concerns, please do not hesitate to contact us via phone or MyChart message.  ? ?[name], MD ?[facility]  ?

## 2021-05-16 NOTE — Assessment & Plan Note (Signed)
BP diary shows blood pressure improved to range of 100-110s/50s-60s.  She remains asymptomatic.  Recommend continuing midodrine 3 times daily and fall precautions reviewed. ?

## 2021-05-16 NOTE — Assessment & Plan Note (Signed)
Patient's hemoglobin today was 9.3.  He is currently asymptomatic and denies SOB, dizziness, chest pain or generalized fatigue.  Improved hemoglobin and the absence of any active bleeding recommend restarting patient's Eliquis which she takes for A-fib.  Discussed strict return precaution with patient who verbalized understanding. ?

## 2021-05-21 DIAGNOSIS — T782XXA Anaphylactic shock, unspecified, initial encounter: Secondary | ICD-10-CM

## 2021-05-21 HISTORY — DX: Anaphylactic shock, unspecified, initial encounter: T78.2XXA

## 2021-05-22 ENCOUNTER — Other Ambulatory Visit: Payer: Self-pay

## 2021-05-22 ENCOUNTER — Emergency Department (HOSPITAL_COMMUNITY): Payer: Medicare HMO

## 2021-05-22 ENCOUNTER — Encounter (HOSPITAL_COMMUNITY): Payer: Self-pay | Admitting: Emergency Medicine

## 2021-05-22 ENCOUNTER — Observation Stay (HOSPITAL_COMMUNITY)
Admission: EM | Admit: 2021-05-22 | Discharge: 2021-05-26 | Disposition: A | Discharge status: Home / Self Care | Payer: Medicare HMO | Attending: Family Medicine | Admitting: Family Medicine

## 2021-05-22 DIAGNOSIS — E871 Hypo-osmolality and hyponatremia: Secondary | ICD-10-CM | POA: Insufficient documentation

## 2021-05-22 DIAGNOSIS — Z85038 Personal history of other malignant neoplasm of large intestine: Secondary | ICD-10-CM | POA: Diagnosis not present

## 2021-05-22 DIAGNOSIS — Z91158 Patient's noncompliance with renal dialysis for other reason: Secondary | ICD-10-CM | POA: Insufficient documentation

## 2021-05-22 DIAGNOSIS — K439 Ventral hernia without obstruction or gangrene: Secondary | ICD-10-CM | POA: Insufficient documentation

## 2021-05-22 DIAGNOSIS — F419 Anxiety disorder, unspecified: Secondary | ICD-10-CM | POA: Insufficient documentation

## 2021-05-22 DIAGNOSIS — I700 Atherosclerosis of aorta: Secondary | ICD-10-CM | POA: Insufficient documentation

## 2021-05-22 DIAGNOSIS — Z8616 Personal history of COVID-19: Secondary | ICD-10-CM | POA: Insufficient documentation

## 2021-05-22 DIAGNOSIS — R42 Dizziness and giddiness: Secondary | ICD-10-CM | POA: Insufficient documentation

## 2021-05-22 DIAGNOSIS — Z833 Family history of diabetes mellitus: Secondary | ICD-10-CM | POA: Insufficient documentation

## 2021-05-22 DIAGNOSIS — Z992 Dependence on renal dialysis: Secondary | ICD-10-CM | POA: Insufficient documentation

## 2021-05-22 DIAGNOSIS — Z79899 Other long term (current) drug therapy: Secondary | ICD-10-CM | POA: Insufficient documentation

## 2021-05-22 DIAGNOSIS — Z800 Family history of malignant neoplasm of digestive organs: Secondary | ICD-10-CM | POA: Insufficient documentation

## 2021-05-22 DIAGNOSIS — Z8553 Personal history of malignant neoplasm of renal pelvis: Secondary | ICD-10-CM | POA: Diagnosis not present

## 2021-05-22 DIAGNOSIS — K3184 Gastroparesis: Secondary | ICD-10-CM | POA: Insufficient documentation

## 2021-05-22 DIAGNOSIS — R112 Nausea with vomiting, unspecified: Secondary | ICD-10-CM | POA: Insufficient documentation

## 2021-05-22 DIAGNOSIS — M898X9 Other specified disorders of bone, unspecified site: Secondary | ICD-10-CM | POA: Insufficient documentation

## 2021-05-22 DIAGNOSIS — E785 Hyperlipidemia, unspecified: Secondary | ICD-10-CM | POA: Insufficient documentation

## 2021-05-22 DIAGNOSIS — I480 Paroxysmal atrial fibrillation: Secondary | ICD-10-CM | POA: Insufficient documentation

## 2021-05-22 DIAGNOSIS — Z955 Presence of coronary angioplasty implant and graft: Secondary | ICD-10-CM | POA: Insufficient documentation

## 2021-05-22 DIAGNOSIS — I4891 Unspecified atrial fibrillation: Secondary | ICD-10-CM | POA: Insufficient documentation

## 2021-05-22 DIAGNOSIS — Z7901 Long term (current) use of anticoagulants: Secondary | ICD-10-CM | POA: Insufficient documentation

## 2021-05-22 DIAGNOSIS — N2581 Secondary hyperparathyroidism of renal origin: Secondary | ICD-10-CM | POA: Insufficient documentation

## 2021-05-22 DIAGNOSIS — R9431 Abnormal electrocardiogram [ECG] [EKG]: Secondary | ICD-10-CM | POA: Insufficient documentation

## 2021-05-22 DIAGNOSIS — Z91040 Latex allergy status: Secondary | ICD-10-CM | POA: Insufficient documentation

## 2021-05-22 DIAGNOSIS — Z933 Colostomy status: Secondary | ICD-10-CM | POA: Insufficient documentation

## 2021-05-22 DIAGNOSIS — I132 Hypertensive heart and chronic kidney disease with heart failure and with stage 5 chronic kidney disease, or end stage renal disease: Principal | ICD-10-CM | POA: Insufficient documentation

## 2021-05-22 DIAGNOSIS — K219 Gastro-esophageal reflux disease without esophagitis: Secondary | ICD-10-CM | POA: Insufficient documentation

## 2021-05-22 DIAGNOSIS — Z8551 Personal history of malignant neoplasm of bladder: Secondary | ICD-10-CM | POA: Diagnosis not present

## 2021-05-22 DIAGNOSIS — N261 Atrophy of kidney (terminal): Secondary | ICD-10-CM | POA: Insufficient documentation

## 2021-05-22 DIAGNOSIS — E1143 Type 2 diabetes mellitus with diabetic autonomic (poly)neuropathy: Secondary | ICD-10-CM | POA: Insufficient documentation

## 2021-05-22 DIAGNOSIS — J984 Other disorders of lung: Secondary | ICD-10-CM | POA: Insufficient documentation

## 2021-05-22 DIAGNOSIS — Z9049 Acquired absence of other specified parts of digestive tract: Secondary | ICD-10-CM | POA: Insufficient documentation

## 2021-05-22 DIAGNOSIS — Z1509 Genetic susceptibility to other malignant neoplasm: Secondary | ICD-10-CM | POA: Insufficient documentation

## 2021-05-22 DIAGNOSIS — E875 Hyperkalemia: Secondary | ICD-10-CM | POA: Diagnosis not present

## 2021-05-22 DIAGNOSIS — I2510 Atherosclerotic heart disease of native coronary artery without angina pectoris: Secondary | ICD-10-CM | POA: Insufficient documentation

## 2021-05-22 DIAGNOSIS — K5730 Diverticulosis of large intestine without perforation or abscess without bleeding: Secondary | ICD-10-CM | POA: Insufficient documentation

## 2021-05-22 DIAGNOSIS — N186 End stage renal disease: Secondary | ICD-10-CM | POA: Diagnosis not present

## 2021-05-22 DIAGNOSIS — I252 Old myocardial infarction: Secondary | ICD-10-CM | POA: Insufficient documentation

## 2021-05-22 DIAGNOSIS — I5043 Acute on chronic combined systolic (congestive) and diastolic (congestive) heart failure: Secondary | ICD-10-CM | POA: Insufficient documentation

## 2021-05-22 DIAGNOSIS — J45909 Unspecified asthma, uncomplicated: Secondary | ICD-10-CM | POA: Insufficient documentation

## 2021-05-22 DIAGNOSIS — G4733 Obstructive sleep apnea (adult) (pediatric): Secondary | ICD-10-CM | POA: Insufficient documentation

## 2021-05-22 DIAGNOSIS — E118 Type 2 diabetes mellitus with unspecified complications: Secondary | ICD-10-CM

## 2021-05-22 DIAGNOSIS — Z8249 Family history of ischemic heart disease and other diseases of the circulatory system: Secondary | ICD-10-CM | POA: Insufficient documentation

## 2021-05-22 DIAGNOSIS — Z905 Acquired absence of kidney: Secondary | ICD-10-CM | POA: Insufficient documentation

## 2021-05-22 DIAGNOSIS — M6281 Muscle weakness (generalized): Secondary | ICD-10-CM | POA: Insufficient documentation

## 2021-05-22 DIAGNOSIS — E1159 Type 2 diabetes mellitus with other circulatory complications: Secondary | ICD-10-CM | POA: Insufficient documentation

## 2021-05-22 DIAGNOSIS — Z713 Dietary counseling and surveillance: Secondary | ICD-10-CM | POA: Insufficient documentation

## 2021-05-22 DIAGNOSIS — E039 Hypothyroidism, unspecified: Secondary | ICD-10-CM | POA: Insufficient documentation

## 2021-05-22 DIAGNOSIS — D631 Anemia in chronic kidney disease: Secondary | ICD-10-CM | POA: Insufficient documentation

## 2021-05-22 DIAGNOSIS — R234 Changes in skin texture: Secondary | ICD-10-CM | POA: Diagnosis not present

## 2021-05-22 DIAGNOSIS — E1122 Type 2 diabetes mellitus with diabetic chronic kidney disease: Secondary | ICD-10-CM | POA: Diagnosis not present

## 2021-05-22 DIAGNOSIS — Z79890 Hormone replacement therapy (postmenopausal): Secondary | ICD-10-CM | POA: Insufficient documentation

## 2021-05-22 DIAGNOSIS — M5136 Other intervertebral disc degeneration, lumbar region: Secondary | ICD-10-CM | POA: Insufficient documentation

## 2021-05-22 DIAGNOSIS — M8588 Other specified disorders of bone density and structure, other site: Secondary | ICD-10-CM | POA: Insufficient documentation

## 2021-05-22 DIAGNOSIS — R110 Nausea: Secondary | ICD-10-CM

## 2021-05-22 DIAGNOSIS — R2689 Other abnormalities of gait and mobility: Secondary | ICD-10-CM | POA: Insufficient documentation

## 2021-05-22 DIAGNOSIS — I440 Atrioventricular block, first degree: Secondary | ICD-10-CM | POA: Insufficient documentation

## 2021-05-22 DIAGNOSIS — R600 Localized edema: Secondary | ICD-10-CM | POA: Insufficient documentation

## 2021-05-22 DIAGNOSIS — E878 Other disorders of electrolyte and fluid balance, not elsewhere classified: Secondary | ICD-10-CM | POA: Insufficient documentation

## 2021-05-22 DIAGNOSIS — I5042 Chronic combined systolic (congestive) and diastolic (congestive) heart failure: Secondary | ICD-10-CM | POA: Insufficient documentation

## 2021-05-22 DIAGNOSIS — Z6837 Body mass index (BMI) 37.0-37.9, adult: Secondary | ICD-10-CM | POA: Insufficient documentation

## 2021-05-22 DIAGNOSIS — R0682 Tachypnea, not elsewhere classified: Secondary | ICD-10-CM | POA: Insufficient documentation

## 2021-05-22 DIAGNOSIS — I9589 Other hypotension: Secondary | ICD-10-CM | POA: Insufficient documentation

## 2021-05-22 LAB — CBC
HCT: 39.2 % (ref 36.0–46.0)
HCT: 32.8 % — ABNORMAL LOW (ref 36.0–46.0)
Hemoglobin: 11.3 g/dL — ABNORMAL LOW (ref 12.0–15.0)
Hemoglobin: 9.2 g/dL — ABNORMAL LOW (ref 12.0–15.0)
MCH: 24.5 pg — ABNORMAL LOW (ref 26.0–34.0)
MCH: 23.2 pg — ABNORMAL LOW (ref 26.0–34.0)
MCHC: 28.8 g/dL — ABNORMAL LOW (ref 30.0–36.0)
MCHC: 28 g/dL — ABNORMAL LOW (ref 30.0–36.0)
MCV: 84.8 fL (ref 80.0–100.0)
MCV: 82.8 fL (ref 80.0–100.0)
Platelets: 422 10*3/uL — ABNORMAL HIGH (ref 150–400)
Platelets: 439 10*3/uL — ABNORMAL HIGH (ref 150–400)
RBC: 4.62 MIL/uL (ref 3.87–5.11)
RBC: 3.96 MIL/uL (ref 3.87–5.11)
RDW: 21 % — ABNORMAL HIGH (ref 11.5–15.5)
RDW: 20.4 % — ABNORMAL HIGH (ref 11.5–15.5)
WBC: 8.6 10*3/uL (ref 4.0–10.5)
WBC: 11.1 10*3/uL — ABNORMAL HIGH (ref 4.0–10.5)
nRBC: 0 % (ref 0.0–0.2)
nRBC: 0 % (ref 0.0–0.2)

## 2021-05-22 LAB — I-STAT VENOUS BLOOD GAS, ED
Acid-base deficit: 2 mmol/L (ref 0.0–2.0)
Bicarbonate: 22.5 mmol/L (ref 20.0–28.0)
Calcium, Ion: 1.2 mmol/L (ref 1.15–1.40)
HCT: 33 % — ABNORMAL LOW (ref 36.0–46.0)
Hemoglobin: 11.2 g/dL — ABNORMAL LOW (ref 12.0–15.0)
O2 Saturation: 99 %
Potassium: 5.9 mmol/L — ABNORMAL HIGH (ref 3.5–5.1)
Sodium: 134 mmol/L — ABNORMAL LOW (ref 135–145)
TCO2: 24 mmol/L (ref 22–32)
pCO2, Ven: 35.2 mmHg — ABNORMAL LOW (ref 44–60)
pH, Ven: 7.413 (ref 7.25–7.43)
pO2, Ven: 137 mmHg — ABNORMAL HIGH (ref 32–45)

## 2021-05-22 LAB — RENAL FUNCTION PANEL
Albumin: 2.6 g/dL — ABNORMAL LOW (ref 3.5–5.0)
Anion gap: 14 (ref 5–15)
BUN: 33 mg/dL — ABNORMAL HIGH (ref 8–23)
CO2: 21 mmol/L — ABNORMAL LOW (ref 22–32)
Calcium: 9.6 mg/dL (ref 8.9–10.3)
Chloride: 101 mmol/L (ref 98–111)
Creatinine, Ser: 9.92 mg/dL — ABNORMAL HIGH (ref 0.44–1.00)
GFR, Estimated: 4 mL/min — ABNORMAL LOW (ref >60)
Glucose, Bld: 144 mg/dL — ABNORMAL HIGH (ref 70–99)
Phosphorus: 6.2 mg/dL — ABNORMAL HIGH (ref 2.5–4.6)
Potassium: 5.1 mmol/L (ref 3.5–5.1)
Sodium: 136 mmol/L (ref 135–145)

## 2021-05-22 LAB — COMPREHENSIVE METABOLIC PANEL
ALT: 18 U/L (ref 0–44)
AST: 35 U/L (ref 15–41)
Albumin: 3 g/dL — ABNORMAL LOW (ref 3.5–5.0)
Alkaline Phosphatase: 103 U/L (ref 38–126)
Anion gap: 17 — ABNORMAL HIGH (ref 5–15)
BUN: 33 mg/dL — ABNORMAL HIGH (ref 8–23)
CO2: 18 mmol/L — ABNORMAL LOW (ref 22–32)
Calcium: 10 mg/dL (ref 8.9–10.3)
Chloride: 102 mmol/L (ref 98–111)
Creatinine, Ser: 9.83 mg/dL — ABNORMAL HIGH (ref 0.44–1.00)
GFR, Estimated: 4 mL/min — ABNORMAL LOW (ref >60)
Glucose, Bld: 214 mg/dL — ABNORMAL HIGH (ref 70–99)
Potassium: 5.8 mmol/L — ABNORMAL HIGH (ref 3.5–5.1)
Sodium: 137 mmol/L (ref 135–145)
Total Bilirubin: 0.6 mg/dL (ref 0.3–1.2)
Total Protein: 6.8 g/dL (ref 6.5–8.1)

## 2021-05-22 LAB — LIPASE, BLOOD: Lipase: 49 U/L (ref 11–51)

## 2021-05-22 LAB — BETA-HYDROXYBUTYRIC ACID: Beta-Hydroxybutyric Acid: 0.4 mmol/L — ABNORMAL HIGH (ref 0.05–0.27)

## 2021-05-22 LAB — CBG MONITORING, ED: Glucose-Capillary: 216 mg/dL — ABNORMAL HIGH (ref 70–99)

## 2021-05-22 MED ORDER — ALTEPLASE 2 MG IJ SOLR: 2.0000 mg | Freq: Once | INTRAMUSCULAR | Status: DC | PRN

## 2021-05-22 MED ORDER — PROCHLORPERAZINE EDISYLATE 10 MG/2ML IJ SOLN
10.0000 mg | Freq: Four times a day (QID) | INTRAMUSCULAR | Status: DC | PRN
  Administered 2021-05-23 (×2): 10 mg via INTRAVENOUS
  Filled 2021-05-22 (×2): qty 2

## 2021-05-22 MED ORDER — INSULIN ASPART 100 UNIT/ML IV SOLN
5.0000 [IU] | Freq: Once | INTRAVENOUS | Status: AC
  Administered 2021-05-22: 5 [IU] via INTRAVENOUS

## 2021-05-22 MED ORDER — CHLORHEXIDINE GLUCONATE CLOTH 2 % EX PADS
6.0000 | MEDICATED_PAD | Freq: Every day | CUTANEOUS | Status: DC
  Administered 2021-05-23 – 2021-05-26 (×4): 6 via TOPICAL

## 2021-05-22 MED ORDER — HEPARIN SODIUM (PORCINE) 1000 UNIT/ML DIALYSIS
1000.0000 [IU] | INTRAMUSCULAR | Status: DC | PRN
  Filled 2021-05-22 (×3): qty 1

## 2021-05-22 MED ORDER — APIXABAN 2.5 MG PO TABS
2.5000 mg | ORAL_TABLET | Freq: Two times a day (BID) | ORAL | Status: DC
Start: 2021-05-22 — End: 2021-05-26
  Administered 2021-05-23 – 2021-05-26 (×8): 2.5 mg via ORAL
  Filled 2021-05-22 (×8): qty 1

## 2021-05-22 MED ORDER — SODIUM CHLORIDE 0.9 % IV SOLN: 100.0000 mL | INTRAVENOUS | Status: DC | PRN

## 2021-05-22 MED ORDER — LEVOTHYROXINE SODIUM 75 MCG PO TABS
175.0000 ug | ORAL_TABLET | Freq: Every day | ORAL | Status: DC
  Administered 2021-05-23 – 2021-05-26 (×4): 175 ug via ORAL
  Filled 2021-05-22 (×4): qty 1

## 2021-05-22 MED ORDER — DEXTROSE 50 % IV SOLN
1.0000 | Freq: Once | INTRAVENOUS | Status: AC
  Administered 2021-05-22: 50 mL via INTRAVENOUS
  Filled 2021-05-22: qty 50

## 2021-05-22 MED ORDER — ACETAMINOPHEN 650 MG RE SUPP: 650.0000 mg | Freq: Four times a day (QID) | RECTAL | Status: DC | PRN

## 2021-05-22 MED ORDER — LIDOCAINE HCL (PF) 1 % IJ SOLN: 5.0000 mL | INTRAMUSCULAR | Status: DC | PRN

## 2021-05-22 MED ORDER — HEPARIN SODIUM (PORCINE) 1000 UNIT/ML DIALYSIS
20.0000 [IU]/kg | INTRAMUSCULAR | Status: DC | PRN
  Filled 2021-05-22: qty 2

## 2021-05-22 MED ORDER — ONDANSETRON HCL 4 MG/2ML IJ SOLN
4.0000 mg | Freq: Once | INTRAMUSCULAR | Status: AC
  Administered 2021-05-22: 4 mg via INTRAVENOUS
  Filled 2021-05-22: qty 2

## 2021-05-22 MED ORDER — FAMOTIDINE 20 MG PO TABS: 20.0000 mg | ORAL_TABLET | Freq: Every morning | ORAL | Status: DC

## 2021-05-22 MED ORDER — ALBUTEROL SULFATE (2.5 MG/3ML) 0.083% IN NEBU: 2.5000 mg | INHALATION_SOLUTION | Freq: Four times a day (QID) | RESPIRATORY_TRACT | Status: DC | PRN

## 2021-05-22 MED ORDER — AMIODARONE HCL 200 MG PO TABS
200.0000 mg | ORAL_TABLET | Freq: Every day | ORAL | Status: DC
  Administered 2021-05-23 – 2021-05-26 (×4): 200 mg via ORAL
  Filled 2021-05-22 (×4): qty 1

## 2021-05-22 MED ORDER — LIDOCAINE-PRILOCAINE 2.5-2.5 % EX CREA: 1.0000 "application " | TOPICAL_CREAM | CUTANEOUS | Status: DC | PRN

## 2021-05-22 MED ORDER — PENTAFLUOROPROP-TETRAFLUOROETH EX AERO: 1.0000 "application " | INHALATION_SPRAY | CUTANEOUS | Status: DC | PRN

## 2021-05-22 MED ORDER — SODIUM ZIRCONIUM CYCLOSILICATE 10 G PO PACK
10.0000 g | PACK | Freq: Every day | ORAL | Status: DC
  Administered 2021-05-22 – 2021-05-23 (×2): 10 g via ORAL
  Filled 2021-05-22 (×2): qty 1

## 2021-05-22 MED ORDER — FLUTICASONE PROPIONATE 50 MCG/ACT NA SUSP
1.0000 | Freq: Every day | NASAL | Status: DC
  Administered 2021-05-23 – 2021-05-26 (×2): 1 via NASAL
  Filled 2021-05-22 (×2): qty 16

## 2021-05-22 MED ORDER — ACETAMINOPHEN 325 MG PO TABS
650.0000 mg | ORAL_TABLET | Freq: Four times a day (QID) | ORAL | Status: DC | PRN
Start: 2021-05-22 — End: 2021-05-26
  Administered 2021-05-23: 650 mg via ORAL
  Filled 2021-05-22 (×2): qty 2

## 2021-05-22 NOTE — Progress Notes (Signed)
Dear Doctor: This patient has been identified as a candidate for CVC for the following reason (s): IV therapy over 48 hours and poor veins/poor circulatory system (CHF, COPD, emphysema, diabetes, steroid use, IV drug abuse, etc.) If you agree, please write an order for the indicated device.  ? ?Thank you for supporting the early vascular access assessment program. ?

## 2021-05-22 NOTE — Hospital Course (Addendum)
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 Her hospital course is detailed below: ? ?Volume Overload  Missed Hemodyalisis  ESRD on HD ?On admission, patient had gone almost a week without hemodialysis.  Chest x-ray showed interstitial edema in the setting of missed HD.  She received HD the day of admission and continued to receive HD on normal schedule with nephrology following. ? 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  Given recurrence of symptoms, GI was consulted while inpatient.  They recommended to continue with Compazine, Reglan and Protonix for medication management and did not recommend an EGD at this time.  Patient was able to tolerate advancing diet prior to discharge and had improvement of nausea and vomiting.  She may benefit from follow-up with GI outpatient. ? ?Other chronic conditions were medically managed with home medications and formulary alternatives as necessary (Hypotension, pAFib, T2DM, hypothyroidism, anxiety, GERD) ?

## 2021-05-22 NOTE — ED Triage Notes (Signed)
Pt c/o N/V, states that she missed last 2 dialysis sessions due to "not feeling well". Pt states that she ate yesterday and felt like "poison". Reports taking phenergan prior to coming to ED. Ostomy present due to hx of colon cancer.  ?

## 2021-05-22 NOTE — ED Notes (Signed)
Attempted IV access, requested IV team consult ?

## 2021-05-22 NOTE — ED Notes (Signed)
Patient transported to dialysis at this time. Per dialysis team on phone call with former RN, they report that the patient is safe to go up with the transport team.  ?

## 2021-05-22 NOTE — Progress Notes (Signed)
Bilateral arms assessed for PIV placement. Very small/deep veins, attempted hand blew. Very poor vasculature recommended placing a CVC. Nurse/MD VU. Fran Lowes, RN VAST ?

## 2021-05-22 NOTE — Progress Notes (Signed)
Received page regarding [name] who is followed by our clinic. Patient is currently stable and is needing admission due to inability to tolerate oral intake, as well as having missed dialysis recently. ? ?FPTS night team will complete the admission for this patient.  ? ? ?Shacoria Latif, DO  ?Pager # [phone] ?

## 2021-05-22 NOTE — ED Provider Notes (Signed)
?[facility] ?Provider Note ? ? ?CSN: [id number] ?Arrival date & time: 05/22/21  1318 ? ?  ? ?History ? ?Chief Complaint  ?Patient presents with  ? Nausea  ? Emesis  ? ? ?[name] is a 81 y.o. female. ? ? ?Emesis ? 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  Patient report she ate some corn and her favorite food on Tuesday and immediately started feeling nauseous.  She thinks her mother ate something bad and might have food poisoning.  She has tried Phenergan prior to come to the emergency department without significant improvement. She continued to have multiple episode of emesis for the past 2 days.  Denies any hematemesis.  She also reports decreased ostomy output.  She denies associated fever, or chest pain.  She has missed her dialysis on Tuesday and Thursday and has been having mild shortness of breath.  Denies any injury to the abdomen.  Denies any recent sick contacts.  She also reports she has been taken off of her diabetes medication for the past year.  Denies taking any insulin.  Denies any headache or vision change.  Denies any abdominal pain.  Otherwise no other complaints. ? ?Home Medications ?Prior to Admission medications   ?Medication Sig Start Date End Date Taking? Authorizing Provider  ?acetaminophen (TYLENOL) 500 MG tablet Take 0.5 tablets (250 mg total) by mouth every 6 (six) hours as needed for mild pain (pain). ?Patient taking differently: Take 1,000 mg by mouth daily as needed for mild pain (pain). 11/22/16  Yes Aline August, MD  ?albuterol (VENTOLIN HFA) 108 (90 Base) MCG/ACT inhaler Inhale 1-2 puffs into the lungs every 6 (six) hours as needed for wheezing or shortness of breath.   Yes [provider]  ?amiodarone (PACERONE) 200  MG tablet TAKE 1 TABLET EVERY DAY ?Patient taking differently: Take 200 mg by mouth daily. 03/26/21  Yes [name], Peter M, MD  ?apixaban (ELIQUIS) 2.5 MG TABS tablet Take 1 tablet (2.5 mg total) by mouth 2 (two) times daily. 05/16/21  Yes [name], MD  ?cetirizine (ZYRTEC) 10 MG tablet Take 10 mg by mouth daily.    Yes [provider]  ?clonazePAM (KLONOPIN) 1 MG tablet Take 0.5-1 mg by mouth See admin instructions. Take 1 mg by mouth at bedtime on Sun/Mon/Wed/Fri and 0.5 mg at bedtime on Tues/Thurs/Sat- may take an additional 0.5-1 mg up to two (more) times a day as needed for anxiety (total combined daily dose is a max of 3 milligrams)   Yes [provider]  ?estradiol (ESTRACE) 1 MG tablet Take 1 mg by mouth daily.   Yes [provider]  ?famotidine (PEPCID) 20 MG tablet Take 20 mg by mouth in the morning.   Yes [provider]  ?fluticasone (FLONASE) 50 MCG/ACT nasal spray Place 1 spray into both nostrils daily. 10/25/19  Yes [provider]  ?levalbuterol Penne Lash) 1.25 MG/3ML nebulizer solution Take 1.25 mg by nebulization 2 (two) times daily as needed for wheezing or shortness of breath. 02/17/21  Yes [provider]  ?midodrine (PROAMATINE) 10 MG tablet Take 1 tablet (10 mg total) by mouth 3 (three) times daily. 04/21/21 07/20/21 Yes Pray, Norwood Levo, MD  ?multivitamin (RENA-VIT) TABS tablet Take 1 tablet by mouth at bedtime.   Yes [provider]  ?promethazine (  PHENERGAN) 12.5 MG tablet Take 12.5 mg by mouth every 6 (six) hours as needed for nausea or vomiting.   Yes [provider]  ?SYNTHROID 175 MCG tablet Take 175 mcg by mouth daily before breakfast. 08/14/19  Yes [provider]  ?vitamin B-12 (CYANOCOBALAMIN) 500 MCG tablet Take 500 mcg by mouth daily.   Yes [provider]  ?calcitRIOL (ROCALTROL) 0.5 MCG capsule Take 2 capsules (1 mcg total) by mouth Every Tuesday,Thursday,and Saturday with dialysis. ?Patient not  taking: Reported on 05/22/2021 02/20/21   Lenoria Chime, MD  ?Nutritional Supplements (,FEEDING SUPPLEMENT, PROSOURCE PLUS) liquid Take 30 mLs by mouth 2 (two) times daily between meals. ?Patient not taking: Reported on 05/22/2021 03/21/21   Erskine Emery, MD  ?sevelamer (RENAGEL) 800 MG tablet Take 1 tablet (800 mg total) by mouth 3 (three) times daily with meals. ?Patient not taking: Reported on 05/22/2021 03/24/21   [name], Peter M, MD  ?   ? ?Allergies    ?Adhesive [tape], Avelox [moxifloxacin], Banana, Blueberry flavor, Cantaloupe extract allergy skin test, Cefprozil, Cetacaine [butamben-tetracaine-benzocaine], Dicyclomine, Food, Imdur [isosorbide nitrate], Januvia [sitagliptin], Lipitor [atorvastatin], Losartan potassium, Nitroglycerin, Omeprazole, Oxycodone, Penicillins, Prednisone, Vancomycin, Watermelon [citrullus vulgaris], Hydrocodone, Latex, Tamiflu [oseltamivir], Feraheme [ferumoxytol], Other, Lasix [furosemide], and Mupirocin   ? ?Review of Systems   ?Review of Systems  ?Gastrointestinal:  Positive for vomiting.  ? ?Physical Exam ?Updated Vital Signs ?BP (!) 118/45 (BP Location: Right Arm)   Pulse 74   Temp 98.8 ?F (37.1 ?C) (Oral)   Resp 16   Ht '5\' 2"'$  (1.575 m)   Wt 93.4 kg   SpO2 100%   BMI 37.68 kg/m?  ?Physical Exam ?Constitutional:   ?   Appearance: She is ill-appearing.  ?HENT:  ?   Head: Normocephalic.  ?   Nose: Nose normal. No congestion.  ?   Mouth/Throat:  ?   Mouth: Mucous membranes are moist.  ?Eyes:  ?   Extraocular Movements: Extraocular movements intact.  ?   Pupils: Pupils are equal, round, and reactive to light.  ?Cardiovascular:  ?   Rate and Rhythm: Normal rate.  ?Pulmonary:  ?   Breath sounds: Wheezing present.  ?   Comments: Mild wheezing in the lower lung fields bilaterally ?Abdominal:  ?   Tenderness: There is no abdominal tenderness. There is no guarding or rebound.  ?   Comments: Minimal stool in the ostomy.  No erythema or redness around the ostomy  ?Musculoskeletal:      ?   General: No tenderness or signs of injury.  ?   Cervical back: Normal range of motion. No tenderness.  ?Skin: ?   General: Skin is warm.  ?Neurological:  ?   General: No focal deficit present.  ?   Mental Status: She is oriented to person, place, and time.  ?   Sensory: No sensory deficit.  ? ? ?ED Results / Procedures / Treatments   ?Labs ?(all labs ordered are listed, but only abnormal results are displayed) ?Labs Reviewed  ?CBC - Abnormal; Notable for the following components:  ?    Result Value  ? Hemoglobin 11.3 (*)   ? MCH 24.5 (*)   ? MCHC 28.8 (*)   ? RDW 21.0 (*)   ? Platelets 422 (*)   ? All other components within normal limits  ?COMPREHENSIVE METABOLIC PANEL - Abnormal; Notable for the following components:  ? Potassium 5.8 (*)   ? CO2 18 (*)   ? Glucose, Bld 214 (*)   ?  BUN 33 (*)   ? Creatinine, Ser 9.83 (*)   ? Albumin 3.0 (*)   ? GFR, Estimated 4 (*)   ? Anion gap 17 (*)   ? All other components within normal limits  ?BETA-HYDROXYBUTYRIC ACID - Abnormal; Notable for the following components:  ? Beta-Hydroxybutyric Acid 0.40 (*)   ? All other components within normal limits  ?RENAL FUNCTION PANEL - Abnormal; Notable for the following components:  ? CO2 21 (*)   ? Glucose, Bld 144 (*)   ? BUN 33 (*)   ? Creatinine, Ser 9.92 (*)   ? Phosphorus 6.2 (*)   ? Albumin 2.6 (*)   ? GFR, Estimated 4 (*)   ? All other components within normal limits  ?CBC - Abnormal; Notable for the following components:  ? WBC 11.1 (*)   ? Hemoglobin 9.2 (*)   ? HCT 32.8 (*)   ? MCH 23.2 (*)   ? MCHC 28.0 (*)   ? RDW 20.4 (*)   ? Platelets 439 (*)   ? All other components within normal limits  ?CBG MONITORING, ED - Abnormal; Notable for the following components:  ? Glucose-Capillary 216 (*)   ? All other components within normal limits  ?I-STAT VENOUS BLOOD GAS, ED - Abnormal; Notable for the following components:  ? pCO2, Ven 35.2 (*)   ? pO2, Ven 137 (*)   ? Sodium 134 (*)   ? Potassium 5.9 (*)   ? HCT 33.0 (*)   ?  Hemoglobin 11.2 (*)   ? All other components within normal limits  ?LIPASE, BLOOD  ?URINALYSIS, ROUTINE W REFLEX MICROSCOPIC  ?BRAIN NATRIURETIC PEPTIDE  ?HEPATITIS B SURFACE ANTIGEN  ?HEPATITIS B SURFACE ANTIBODY

## 2021-05-22 NOTE — ED Provider Triage Note (Signed)
Emergency Medicine Provider Triage Evaluation Note ? ?[name] , a 81 y.o. female  was evaluated in triage.  Pt complains of nausea, vomiting, malaise, missed dialysis x2.  Patient goes to dialysis Tuesday Thursday Saturday, and missed last Tuesday as well as today.  She reports that she is just been feeling very sick.  She has been having stomach problems, stomach pain.  She had a CT scan in the last month which was unremarkable.  She has a history of colon cancer, has a total colon resection, and ostomy bag.  She reports normal ostomy output for the last week.  She reports that she needs to be scheduled for an endoscopy with her gastroenterologist.  She denies hematemesis, or bloody output into her ostomy bag.  She denies chest pain, shortness of breath, leg swelling. Endorses pain "all over". ? ?Review of Systems  ?Positive: Nausea, vomiting, abdominal pain, malaise, fatigue ?Negative: Chest pain, leg swelling ? ?Physical Exam  ?BP (!) 155/64 (BP Location: Right Arm)   Pulse 64   Temp 97.7 ?F (36.5 ?C) (Oral)   Resp (!) 23   SpO2 100%  ?Gen:   Awake, ill appearing ?Resp:  Normal effort, no crackles ?MSK:   Moves extremities without difficulty ?Other:  Ostomy bag in place, large ventral hernia, no significant TTP of abdomen ? ?Medical Decision Making  ?Medically screening exam initiated at 1:28 PM.  Appropriate orders placed.  [name] was informed that the remainder of the evaluation will be completed by another provider, this initial triage assessment does not replace that evaluation, and the importance of remaining in the ED until their evaluation is complete. ? ?Workup initiated ?  ?Anselmo Pickler, PA-C ?05/22/21 1330 ? ?

## 2021-05-22 NOTE — ED Notes (Signed)
IV team and MD at bedside attempting access ?
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
## 2021-05-22 NOTE — H&P (Addendum)
Family Medicine Teaching Service ?Hospital Admission History and Physical ?Service Pager: [phone] ? ?Patient name: [name] Medical record number: [id number] ?Date of birth: 12-Jul-1940 Age: 81 y.o. Gender: female ? ?Primary Care Provider: [name], MD ?Consultants: Nephrology ?Code Status: FULL ?Preferred Emergency Contact: daughter Heinz Knuckles ?Contact Information   ? ? Name Relation Home Work Mobile  ? Heinz Knuckles Daughter [phone]  [phone]  ? Amos,Donnie Relative [phone]    ? Strength,Bryan Son   [phone]  ? ?  ?  ? ?Chief Complaint: Poor PO intake and missed HD ? ?Assessment and Plan: ?[name] is a 81 y.o. female presenting with poor PO intake and volume overload in the setting of missed HD. 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  ? ?Volume Overload  Missed Hemodyalisis  ESRD on HD ?Hx of CAD ?Has gone almost a week without HD.  Admission labs remarkable for CO2 18, Cr 9.83, BUN 33, Anion Gap 17. CXR with interstitial edema. Last echo EF 55%, G2DD.  ?- Admit to [facility], med-tele, Dr. Erin Hearing attending ?- Nephrology consulted, appreciate recs ?- Likely needs urgent HD, tonight ?- Continue home rena vit and vitamin b12 PO ?- BNP pending ? ?Poor PO intake  N/V  Decreased colostomy output  ?Hx of gastroparesis ?Feeling ill since Tuesday. Nausea refractory to phernergan. 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  CT abdomen/pelvis does not show any acute processes that would be concerning for obstruction, colitis, or diverticulitis ?--Compazine 10 mg every 6 hours as needed ?--Likely secondary to missed HD ? ?Hyperkalemia ?K 5.8 on arrival. Temporizing measures in the ED: Lokelma x1, Insulin 5units. Likely will be dialyzed this evening. ?- Urgent dialysis as above ?- Monitor RFP  ? ?Hypotension  ?Takes midodrine '10mg'$  TID for chronic hypotension.  BP actually elevated on arrival, likely 2/2 volume overload in the setting of missed HD. Will hold midodrine for now but will likely resume tomorrow or day after ?- Resume midodrine TID ? ?Anemia of CKD ?Hgb 11.3 today, down to 9.2 and stable.  Likely secondary to ESRD. ? ?Paroxysmal A Fib on Eliquis ?CHA2DS2-VASc 7 . HAS-BLED 3. Home regimen amiodarone '200mg'$  daily and Eliquis 2.'5mg'$  BID.  ?- Continue home amiodarone and Eliquis ? ?T2DM ?Currently managed without medications. Glucose 214. ?- CBGs ? ?Hypothyroidism ?- Continue home Synthroid 159mg daily ? ?Anixety ?On Klonopin at home, takes '1mg'$  on non-dialysis days and 0.'5mg'$  on dialysis days. ?- Continue home Klonopin 0.5-1 mg BID PRN ? ?GERD ?- Continue home Pepcid '20mg'$  daily ? ?FEN/GI: Clear liquid diet ?Prophylaxis: Eliquis 2.5 twice daily ? ?Disposition: Telemetry med ? ?History of Present Illness:  CNAYDELINE MORACEis a 81y.o. female presenting with nausea and vomiting.  ? ?Patient reports she has had nausea and vomiting for 2.5 days. She reports associated dizziness. Also has a dry cough. Daughter believes cornbread and milk set off her symptoms. She saw her surgeon last week and was recommended to get in with GI for potential EGD, referral was placed. She missed HD this past Tuesday and today. Denies difficulty swallowing, hematemesis, melena. No sick contacts. She reports nausea is better after receiving Zofran in the ED about 5 hours ago. ? ?Denies smoking, alcohol, recreational drug use, vaping. ? ?Review Of Systems: Per HPI with the following additions:  ? ?Review of Systems  ?Eyes:  Negative for visual disturbance.  ?Respiratory:  Positive  for cough. Negative for shortness of breath.   ?Cardiovascular:  Negative for chest pain.  ?Gastrointestinal:  Positive for nausea and vomiting. Negative for abdominal pain and blood in stool.  ?Neurological:  Positive for dizziness. Negative for headaches.   ? ?Patient Active Problem List  ? Diagnosis Date Noted  ?  ESRD (end stage renal disease) ([place]) 05/22/2021  ? Grade II diastolic dysfunction 16/10/9602  ? Gastroparesis 03/20/2021  ? 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 Dehydration 03/20/2021  ? Hyponatremia   ? Toe injury   ? Hyperkalemia 03/19/2021  ? Nausea & vomiting 02/27/2021  ? AVM (arteriovenous malformation) of small bowel, acquired with hemorrhage   ? 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  ? Pressure injury of skin 02/16/2021  ? Blood loss anemia 02/15/2021  ? Bacteremia 07/01/2020  ? Nausea without vomiting   ? Coagulation defect, unspecified ([place]) 05/23/2019  ? ESRD on dialysis ([place]) 04/10/2019  ? Chronic kidney disease (CKD), active medical management without dialysis, stage 5 ([place])   ? Diabetes mellitus with renal manifestation ([place]) 05/30/2015  ? Hypertensive heart disease   ? A-fib ([place]) 04/16/2014  ? Arterial hypotension   ? CAD (coronary artery disease) 01/11/2013  ? Hyperlipidemia 01/11/2013  ? Hypertension associated with chronic kidney disease due to type 2 diabetes mellitus ([place])   ? DM neuropathy, type II diabetes mellitus ([place]) 01/09/2013  ? Obesity, Class III, BMI 40-49.9 (morbid obesity) ([place]) 03/05/2010  ? Hypothyroidism 02/07/2009  ? Anxiety state 02/07/2009  ? Asthma 02/07/2009  ? GASTROESOPHAGEAL REFLUX DISEASE, CHRONIC 02/07/2009  ? Osteoarthrosis, unspecified whether generalized or localized, unspecified site 02/07/2009  ? ? ?Past Medical History: ?Past Medical History:  ?Diagnosis Date  ? Acute on chronic combined systolic and diastolic CHF (congestive heart failure) ([place]) 08/26/2018  ? Acute on chronic diastolic ACC/AHA stage C congestive heart failure ([place])   ? Acute right-sided CHF (congestive heart failure) ([place]) 08/02/2013  ? Adenomatous colon polyp 02/13/2009  ? Allergy   ? april- september   ? Anemia   ? Anxiety   ? Asthma   ? Atrial fibrillation ([place]) 05/13/2014  ? Atrial flutter ([place])   ? Bell's palsy 2013  ? Cancer of right renal pelvis ([place])   ? a. 01/2015  s/p robot assisted lap nephroureterectomy, lysis of adhesions.  ? CAROTID STENOSIS 01/29/2010  ? Qualifier: Diagnosis of  By: Percival Spanish, MD, Farrel Gordon    ? Cellulitis 05/30/2015  ? Cellulitis, abdominal wall 05/30/2015  ? Chronic combined systolic and diastolic CHF (congestive heart failure) ([place])   ? a. 12/2012 Echo: EF 45%, grade 3 DD; b. 08/2014 TEE: EF 55%.  ? Chronic diastolic CHF (congestive heart failure) ([place]) 09/22/2013  ? Chronic respiratory failure ([place])   ? Complication of anesthesia   ? difficult to awaken , N/V  ? COVID-19 01/30/2020  ? Degenerative disc disease, cervical   ? Dementia ([place])   ? Depression   ? Diabetes mellitus without complication ([place])   ? Type II  ? Diverticulitis   ? DIVERTICULITIS, HX OF 02/07/2009  ? Qualifier: Diagnosis of  By: Zeb Comfort    ? DYSPHAGIA UNSPECIFIED 02/07/2009  ? Qualifier: Diagnosis of  By: Zeb Comfort    ? End stage renal disease ([place])   ? T/Th/ Sat Jeneen Rinks  ? Family history of adverse reaction to anesthesia   ? Father - N/V  ? GASTROESOPHAGEAL REFLUX DISEASE, CHRONIC 02/07/2009  ? Qualifier: Diagnosis of  By: Zeb Comfort    ?  Gastroparesis   ? Dx by Dr Gala Romney ([facility]) before 2014. Subsequently tx'd by Dr Jerilynn Mages. Fuller Plan ([facility], starting 2014)  ? GERD (gastroesophageal reflux disease)   ? Gout   ? Grade II diastolic dysfunction 39/76/7341  ? transthoracic echocardiogram 01/2021  ? Hematoma 07/2015  ? post Nephrectomy  ? Hiatal hernia   ? History of blood transfusion   ? History of kidney stones   ? passed  ? HOH (hard of hearing)   ? Hyperlipidemia   ? Hypertension   ? Hypothyroidism   ? IBS (irritable bowel syndrome)   ? Influenza with respiratory manifestations 04/18/2014  ? Lynch syndrome   ? 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[place])   ? Malignant neoplasm of descending colon ([place])   ? MSSA (methicillin susceptible Staphylococcus aureus) septicemia ([place])   ? MSSA bacteremia 06/20/2020  ? Neuropathy of both feet   ? NICM (nonischemic  cardiomyopathy) ([place])   ? a. 12/2012 Echo: EF 45% with grade 3 DD;  b. 08/2014 TEE: EF 55%, no rwma, mod RAE, mod-sev LAE, triv MR/TR, No LAA thrombus, no PFO/ASD, Grade III plaque in desc thoracic Ao.  ? Non-obstru

## 2021-05-23 DIAGNOSIS — K219 Gastro-esophageal reflux disease without esophagitis: Secondary | ICD-10-CM

## 2021-05-23 DIAGNOSIS — N186 End stage renal disease: Secondary | ICD-10-CM

## 2021-05-23 DIAGNOSIS — Z1509 Genetic susceptibility to other malignant neoplasm: Secondary | ICD-10-CM

## 2021-05-23 DIAGNOSIS — R112 Nausea with vomiting, unspecified: Secondary | ICD-10-CM | POA: Diagnosis not present

## 2021-05-23 DIAGNOSIS — I132 Hypertensive heart and chronic kidney disease with heart failure and with stage 5 chronic kidney disease, or end stage renal disease: Secondary | ICD-10-CM | POA: Diagnosis not present

## 2021-05-23 LAB — CBC
HCT: 32 % — ABNORMAL LOW (ref 36.0–46.0)
Hemoglobin: 9.2 g/dL — ABNORMAL LOW (ref 12.0–15.0)
MCH: 23.1 pg — ABNORMAL LOW (ref 26.0–34.0)
MCHC: 28.8 g/dL — ABNORMAL LOW (ref 30.0–36.0)
MCV: 80.4 fL (ref 80.0–100.0)
Platelets: 321 10*3/uL (ref 150–400)
RBC: 3.98 MIL/uL (ref 3.87–5.11)
RDW: 20.1 % — ABNORMAL HIGH (ref 11.5–15.5)
WBC: 13.6 10*3/uL — ABNORMAL HIGH (ref 4.0–10.5)
nRBC: 0.1 % (ref 0.0–0.2)

## 2021-05-23 LAB — HEPATITIS B SURFACE ANTIGEN
Hepatitis B Surface Ag: NONREACTIVE
Hepatitis B Surface Ag: NONREACTIVE

## 2021-05-23 LAB — RENAL FUNCTION PANEL
Albumin: 2.5 g/dL — ABNORMAL LOW (ref 3.5–5.0)
Anion gap: 10 (ref 5–15)
BUN: 12 mg/dL (ref 8–23)
CO2: 24 mmol/L (ref 22–32)
Calcium: 8.2 mg/dL — ABNORMAL LOW (ref 8.9–10.3)
Chloride: 102 mmol/L (ref 98–111)
Creatinine, Ser: 4.86 mg/dL — ABNORMAL HIGH (ref 0.44–1.00)
GFR, Estimated: 9 mL/min — ABNORMAL LOW (ref >60)
Glucose, Bld: 128 mg/dL — ABNORMAL HIGH (ref 70–99)
Phosphorus: 3.4 mg/dL (ref 2.5–4.6)
Potassium: 4.1 mmol/L (ref 3.5–5.1)
Sodium: 136 mmol/L (ref 135–145)

## 2021-05-23 LAB — BRAIN NATRIURETIC PEPTIDE: B Natriuretic Peptide: 767 pg/mL — ABNORMAL HIGH (ref 0.0–100.0)

## 2021-05-23 LAB — HEPATITIS B SURFACE ANTIBODY,QUALITATIVE: Hep B S Ab: NONREACTIVE

## 2021-05-23 MED ORDER — METOCLOPRAMIDE HCL 5 MG/ML IJ SOLN
5.0000 mg | Freq: Three times a day (TID) | INTRAMUSCULAR | Status: DC
  Administered 2021-05-23: 5 mg via INTRAVENOUS
  Filled 2021-05-23 (×2): qty 2

## 2021-05-23 MED ORDER — CLONAZEPAM 0.5 MG PO TABS: 0.5000 mg | ORAL_TABLET | Freq: Two times a day (BID) | ORAL | Status: DC | PRN

## 2021-05-23 MED ORDER — RENA-VITE PO TABS
1.0000 | ORAL_TABLET | Freq: Every day | ORAL | Status: DC
  Administered 2021-05-23 – 2021-05-25 (×3): 1 via ORAL
  Filled 2021-05-23 (×3): qty 1

## 2021-05-23 MED ORDER — FAMOTIDINE 20 MG PO TABS: 40.0000 mg | ORAL_TABLET | Freq: Two times a day (BID) | ORAL | Status: DC

## 2021-05-23 MED ORDER — MIDODRINE HCL 5 MG PO TABS
10.0000 mg | ORAL_TABLET | Freq: Three times a day (TID) | ORAL | Status: DC
  Administered 2021-05-23 – 2021-05-26 (×9): 10 mg via ORAL
  Filled 2021-05-23 (×9): qty 2

## 2021-05-23 MED ORDER — PANTOPRAZOLE SODIUM 40 MG PO TBEC
40.0000 mg | DELAYED_RELEASE_TABLET | Freq: Every day | ORAL | Status: DC
  Filled 2021-05-23: qty 1

## 2021-05-23 MED ORDER — VITAMIN B-12 1000 MCG PO TABS
500.0000 ug | ORAL_TABLET | Freq: Every day | ORAL | Status: DC
  Administered 2021-05-23 – 2021-05-26 (×4): 500 ug via ORAL
  Filled 2021-05-23 (×4): qty 1

## 2021-05-23 MED ORDER — FAMOTIDINE 20 MG PO TABS
20.0000 mg | ORAL_TABLET | Freq: Every day | ORAL | Status: DC
Start: 2021-05-23 — End: 2021-05-26
  Administered 2021-05-23 – 2021-05-26 (×4): 20 mg via ORAL
  Filled 2021-05-23 (×4): qty 1

## 2021-05-23 NOTE — Consult Note (Addendum)
[facility] Nurse ostomy consult note ?Patient receiving care in [place] ?Patient is independent with ostomy care. She has been taking care of it for a year. ?Stoma type/location: RLQ end ileostomy created 05/08/20 at [facility]. ?Stomal assessment/size: 1 3/8" just above skin level with denuded skin surrounding the stoma and peristomal hernia ?Treatment options for stomal/peristomal skin: barrier ring, no sting barrier wipes ?Output: thin brown approx 200 cc in pouch ?Ostomy pouching: 2pc. 2 3/4" high output pouch [name] # [phone]) Skin barrier [name] # 2) Barrier rings [name] # [phone]) ?Education provided: None  ?Enrolled patient in [place] program: No  ? ?NOTE: If output picks up and the pouch is having to be emptied very frequently may hook up to a urine drainage bag. Left in cabinet outside patient room. ? ?Thank you for the consult. [facility] nurse will not follow at this time.   ?Please re-consult the [facility] team if needed. ? ?[name]. Allison Julian, MSN, RN, CMSRN, AGCNS, WTA ?Wound Treatment Associate ?Pager [phone]   ? ?  ?

## 2021-05-23 NOTE — Consult Note (Addendum)
? ?Referring Provider: Dr. Andria Frames ?Primary Care Physician:  [name], MD ?Primary Gastroenterologist:  Dr. Fuller Plan ? ?Reason for Consultation:  Nausea and vomiting ? ?HPI: [name] is a 81 y.o. 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  GI being consulted for recurrent N/V and poor PO intake.  The daughter tells me that her mother has been going through these cycles with nausea and vomiting.  Some days she is fine and does not have it at all.  It sounds like maybe if she eats a little too much then she starts with the vomiting episodes. The last time that she was seen by Dr. Drue Flirt she suggested that maybe she be seen by GI and have an EGD.  There was mention of gastroparesis in her chart previously.  Is a longstanding diabetic.  She is currently receiving Compazine which does seem to help. ? ?Ileoscopy 01/2021: ?- A single angioectasia in the ileum. Treated with argon plasma coagulation (APC). ? ?Colonoscopy 01/2021: ?- Non-bleeding external and internal hemorrhoids. ?- Diverticulosis in the sigmoid colon. ?- Her colon was not completely evaluated due to the fixed nature of the colon due to likely ?adhesions. ?- The examination was otherwise normal on direct and retroflexion views. ?- No specimens collected. ? ?She had an EGD in February 2022 that showed some gastritis, negative for H. Pylori. ? ?Past Medical History:  ?Diagnosis Date  ? Acute on chronic combined systolic and diastolic CHF (congestive heart failure) ([place]) 08/26/2018  ? Acute on chronic diastolic ACC/AHA stage C congestive heart failure ([place])   ? Acute right-sided CHF (congestive heart failure) ([place])  08/02/2013  ? Adenomatous colon polyp 02/13/2009  ? Allergy   ? april- september   ? Anemia   ? Anxiety   ? Asthma   ? Atrial fibrillation ([place]) 05/13/2014  ? Atrial flutter ([place])   ? Bell's palsy 2013  ? Cancer of right renal pelvis ([place])   ? a. 01/2015 s/p robot assisted lap nephroureterectomy, lysis of adhesions.  ? CAROTID STENOSIS 01/29/2010  ? Qualifier: Diagnosis of  By: Percival Spanish, MD, Farrel Gordon    ? Cellulitis 05/30/2015  ? Cellulitis, abdominal wall 05/30/2015  ? Chronic combined systolic and diastolic CHF (congestive heart failure) ([place])   ? a. 12/2012 Echo: EF 45%, grade 3 DD; b. 08/2014 TEE: EF 55%.  ? Chronic diastolic CHF (congestive heart failure) ([place]) 09/22/2013  ? Chronic respiratory failure ([place])   ? Complication of anesthesia   ? difficult to awaken , N/V  ? COVID-19 01/30/2020  ? Degenerative disc disease, cervical   ? Dementia ([place])   ? Depression   ? Diabetes mellitus without complication ([place])   ? Type II  ? Diverticulitis   ? DIVERTICULITIS, HX OF 02/07/2009  ? Qualifier: Diagnosis of  By: Zeb Comfort    ? DYSPHAGIA UNSPECIFIED 02/07/2009  ? Qualifier: Diagnosis of  By: Zeb Comfort    ? End stage renal disease ([place])   ? T/Th/ Sat Jeneen Rinks  ? Family history of adverse reaction to anesthesia   ? Father - N/V  ? GASTROESOPHAGEAL REFLUX DISEASE, CHRONIC 02/07/2009  ? Qualifier: Diagnosis of  By: Zeb Comfort    ?  Gastroparesis   ? Dx by Dr Gala Romney ([facility]) before 2014. Subsequently tx'd by Dr Jerilynn Mages. Fuller Plan ([facility], starting 2014)  ? GERD (gastroesophageal reflux disease)   ? Gout   ? Grade II diastolic dysfunction 65/03/5463  ? transthoracic echocardiogram 01/2021  ? Hematoma 07/2015  ? post Nephrectomy  ? Hiatal hernia   ? History of blood transfusion   ? History of kidney stones   ? passed  ? HOH (hard of hearing)   ? Hyperlipidemia   ? Hypertension   ? Hypothyroidism   ? IBS (irritable bowel syndrome)   ? Influenza with respiratory manifestations 04/18/2014  ? Lynch syndrome   ?  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[place])   ? Malignant neoplasm of descending colon ([place])   ? MSSA (methicillin susceptible Staphylococcus aureus) septicemia ([place])   ? MSSA bacteremia 06/20/2020  ? Neuropathy of both feet   ? NICM (nonischemic cardiomyopathy) ([place])   ? a. 12/2012 Echo: EF 45% with grade 3 DD;  b. 08/2014 TEE: EF 55%, no rwma, mod RAE, mod-sev LAE, triv MR/TR, No LAA thrombus, no PFO/ASD, Grade III plaque in desc thoracic Ao.  ? Non-obstructive CAD   ? NSTEMI (non-ST elevated myocardial infarction) ([place]) 01/08/2013  ? Obesity (BMI 30-39.9)   ? Occult blood in stools   ? Osteoarthritis   ? Oxygen dependent   ? a. patient uses 1l at rest and 2L with exertion   ? PAF (paroxysmal atrial fibrillation) ([place])   ? Palpitations 01/29/2010  ? Qualifier: Diagnosis of  By: Percival Spanish, MD, Farrel Gordon    ? Persistent atrial fibrillation ([place])   ? PFO (patent foramen ovale)   ? trivial by TEE 06/2020  ? PONV (postoperative nausea and vomiting)   ? PSVT (paroxysmal supraventricular tachycardia) ([place])   ? Pulmonary edema 08/25/2018  ? Sleep apnea   ? pt scored 5 per stop bang tool per PAT visit 02/14/2015; results sent to PCP Dr Melina Copa   ? Status post dilation of esophageal narrowing   ? Syncope   ? a. 12/2012: MDT Reveal LINQ ILR placed;  b. 12/2012 Echo: EF 45-50%, Gr 3 DD, mild MR, mildly dil LA;  c. 12/2012 Carotid U/S: 1-39% bilat ICA stenosis.  ? Urothelial cancer ([place])   ? UTI (lower urinary tract infection) 05/30/2015  ? Vitamin D deficiency   ? Wears glasses   ? ? ?Past Surgical History:  ?Procedure Laterality Date  ? APPENDECTOMY    ? AV FISTULA PLACEMENT Left 08/02/2018  ? Procedure: ARTERIOVENOUS (AV) FISTULA CREATION LEFT ARM;  Surgeon: Waynetta Sandy, MD;  Location: [facility];  Service: Vascular;  Laterality: Left;  ? AV FISTULA PLACEMENT Left 06/16/2019  ? AV FISTULA PLACEMENT Left 06/16/2019  ? Procedure: INSERTION OF ARTERIOVENOUS (AV) GORE-TEX GRAFT THIGH;  Surgeon: Serafina Mitchell, MD;   Location: [facility];  Service: Vascular;  Laterality: Left;  ? BASCILIC VEIN TRANSPOSITION Left 10/05/2018  ? Procedure: BASILIC VEIN TRANSPOSITION SECOND STAGE- Using 4-80m STRETCH Goretex Vascular Graft;  Surgeon: CWaynetta Sandy MD;  [facility]  Service: Vascular;  Laterality: Left;  ? BIOPSY  03/12/2020  ? Procedure: BIOPSY;  Surgeon: SLadene Artist MD;  [facility];  Service: Endoscopy;;  EGD and COLON  ? BUBBLE STUDY  06/28/2020  ? Procedure: BUBBLE STUDY;  Surgeon: OGeralynn Rile MD;  [facility]  Service: Cardiovascular;;  ? CARDIAC CATHETERIZATION  03/21/2014  ? Procedure: RIGHT/LEFT HEART CATH AND CORONARY ANGIOGRAPHY;  Surgeon: Blane Ohara, MD;  Location[facility] CATH LAB;  Service: Cardiovascular;;  ? CARDIOVERSION N/A 07/27/2014  ? Procedure: CARDIOVERSION;  Surgeon: Pixie Casino, MD;  Location: [facility] ENDOSCOPY;  Service: Cardiovascular;  Laterality: N/A;  ? CARPAL TUNNEL RELEASE Bilateral   ? CERVICAL SPINE SURGERY    ? CESAREAN SECTION    ? CHOLECYSTECTOMY  1964  ? COLONOSCOPY    ? COLONOSCOPY W/ POLYPECTOMY    ? COLONOSCOPY WITH PROPOFOL N/A 03/12/2020  ? Procedure: COLONOSCOPY WITH PROPOFOL;  Surgeon: Ladene Artist, MD;  Location: [facility];  Service: Endoscopy;  Laterality: N/A;  ? COLONOSCOPY WITH PROPOFOL N/A 02/19/2021  ? Procedure: COLONOSCOPY WITH PROPOFOL;  Surgeon: [name], MD;  Location: [facility];  Service: Gastroenterology;  Laterality: N/A;  ? CYSTOSCOPY N/A 08/09/2015  ? Procedure: CYSTOSCOPY FLEXIBLE;  Surgeon: Alexis Frock, MD;  Location: [facility];  Service: Urology;  Laterality: N/A;  ? CYSTOSCOPY WITH URETEROSCOPY AND STENT PLACEMENT Right 11/23/2014  ? Procedure: CYSTOSCOPY RIGHT URETEROSCOPY , RETROGRADE AND STENT PLACEMENT, BLADDER BIOPSY AND FULGURATION;  Surgeon: Festus Aloe, MD;  Location: [facility];  Service: Urology;  Laterality: Right;  ? CYSTOSCOPY WITH URETEROSCOPY AND STENT PLACEMENT Right 12/07/2014  ? Procedure:  CYSTOSCOPY RIGHT URETEROSCOPY, RIGHT RETROGRADE, BIOPSY AND STENT PLACEMENT;  Surgeon: Kathie Rhodes, MD;  Location: [facility];  Service: Urology;  Laterality: Right;  ? ELECTROPHYSIOLOGIC STUDY N/A 09/11/2014

## 2021-05-23 NOTE — Evaluation (Signed)
Occupational Therapy Evaluation/Discharge ?Patient Details ?Name: [name] ?MRN: [id number] ?DOB: 09-17-1940 ?Today's Date: 05/23/2021 ? ? ?History of Present Illness [name] is a 81 y.o. female presenting with poor PO intake and volume overload in the setting of missed HD. PMH:  ESRD on HD, Lynch syndrome with hx colon cancer s/p R hemicolectomy with colostomy, hypothyroidism, asthma, GERD, T2DM, paroxysmal A fib on eliquis, CAD, HLD.  ? ?Clinical Impression ?  ?PTA, pt lives with family (24/7 support), currently using wheelchair for mobility, and receiving light assist for ADLs/transfers to/from wheelchair and [facility]. Pt presents now at baseline for functional tasks, Min A for bed mobility and min guard for standing. Pt declined further OOB activity due to dizziness in standing due to medications per pt/family. Pt's daughter present, endorses ability to assist pt and no DME/therapy needs. Encouraged use of prevalon boots to prevent L heel wound from worsening and asked secretary to order boots for pt. No further skilled OT services needed at this time.   ?   ? ?Recommendations for follow up therapy are one component of a multi-disciplinary discharge planning process, led by the attending physician.  Recommendations may be updated based on patient status, additional functional criteria and insurance authorization.  ? ?Follow Up Recommendations ? No OT follow up  ?  ?Assistance Recommended at Discharge Intermittent Supervision/Assistance  ?Patient can return home with the following A little help with walking and/or transfers;A little help with bathing/dressing/bathroom ? ?  ?Functional Status Assessment ? Patient has not had a recent decline in their functional status  ?Equipment Recommendations ? None recommended by OT  ?  ?Recommendations for Other Services   ? ? ?  ?Precautions / Restrictions Precautions ?Precautions: Fall;Other (comment) ?Precaution Comments: monitor O2,  colostomy ?Restrictions ?Weight Bearing Restrictions: No  ? ?  ? ?Mobility Bed Mobility ?Overal bed mobility: Needs Assistance ?Bed Mobility: Supine to Sit, Sit to Supine ?  ?  ?Supine to sit: Min assist ?Sit to supine: Supervision ?  ?General bed mobility comments: handheld assist to lift trunk, able to get BLE back into bed ?  ? ?Transfers ?Overall transfer level: Needs assistance ?Equipment used: None ?Transfers: Sit to/from Stand ?Sit to Stand: Min guard ?  ?  ?  ?  ?  ?General transfer comment: no assist for DME needed, reported dizziness from nausea meds and declined further OOB attempts. Encouraged to sit up in chair this afternoon with attempts to eat ?  ? ?  ?Balance Overall balance assessment: Needs assistance ?Sitting-balance support: No upper extremity supported, Feet supported ?Sitting balance-Leahy Scale: Good ?  ?  ?Standing balance support: No upper extremity supported, During functional activity ?Standing balance-Leahy Scale: Fair ?Standing balance comment: fair static standing without DME ?  ?  ?  ?  ?  ?  ?  ?  ?  ?  ?  ?   ? ?ADL either performed or assessed with clinical judgement  ? ?ADL Overall ADL's : At baseline ?  ?  ?  ?  ?  ?  ?  ?  ?  ?  ?  ?  ?  ?  ?  ?  ?  ?  ?  ?General ADL Comments: Able to stand/transfer with limited assist, at baseline for bathing/dressing. Educated on obtaining bedrails if needed at home (through palliative care), use of prevalon boots to prvent L heel wound from worsening and floating heels in bed  ? ? ? ?Vision Baseline Vision/History: 1 Wears glasses ?  Ability to See in Adequate Light: 0 Adequate ?Patient Visual Report: No change from baseline ?Vision Assessment?: No apparent visual deficits  ?   ?Perception   ?  ?Praxis   ?  ? ?Pertinent Vitals/Pain Pain Assessment ?Pain Assessment: No/denies pain  ? ? ? ?Hand Dominance Right ?  ?Extremity/Trunk Assessment Upper Extremity Assessment ?Upper Extremity Assessment: Generalized weakness ?  ?Lower Extremity  Assessment ?Lower Extremity Assessment: Generalized weakness (scabbed wound on heel of L foot) ?  ?Cervical / Trunk Assessment ?Cervical / Trunk Assessment: Normal ?  ?Communication Communication ?Communication: HOH ?  ?Cognition Arousal/Alertness: Awake/alert ?Behavior During Therapy: [facility] for tasks assessed/performed ?Overall Cognitive Status: Within Functional Limits for tasks assessed ?  ?  ?  ?  ?  ?  ?  ?  ?  ?  ?  ?  ?  ?  ?  ?  ?  ?  ?  ?General Comments  Daughter present and providing information as well ? ?  ?Exercises   ?  ?Shoulder Instructions    ? ? ?Home Living Family/patient expects to be discharged to:: Private residence ?Living Arrangements: Children ?Available Help at Discharge: Family;Available 24 hours/day ?Type of Home: House ?Home Access: Ramped entrance ?  ?  ?Home Layout: One level ?  ?  ?Bathroom Shower/Tub: Walk-in shower ?  ?Bathroom Toilet: Handicapped height ?  ?  ?Home Equipment: Rollator (4 wheels);Wheelchair - manual;BSC/3in1;Other (comment) (adjustable bed ; wc cushion) ?  ?Additional Comments: 2-3L home O2 ?  ? ?  ?Prior Functioning/Environment Prior Level of Function : Needs assist ?  ?  ?  ?  ?  ?  ?Mobility Comments: primarily transferring to/from w/c with family assist, can propel self short distances ?ADLs Comments: Family assisting with BSC transfers, all bathing/dressing tasks. ?  ? ?  ?  ?OT Problem List: Decreased strength;Decreased activity tolerance;Impaired balance (sitting and/or standing) ?  ?   ?OT Treatment/Interventions:    ?  ?OT Goals(Current goals can be found in the care plan section) Acute Rehab OT Goals ?Patient Stated Goal: resolve nausea issues ?OT Goal Formulation: All assessment and education complete, DC therapy  ?OT Frequency:   ?  ? ?Co-evaluation   ?  ?  ?  ?  ? ?  ?AM-PAC OT "6 Clicks" Daily Activity     ?Outcome Measure Help from another person eating meals?: A Little ?Help from another person taking care of personal grooming?: A Little ?Help from  another person toileting, which includes using toliet, bedpan, or urinal?: A Little ?Help from another person bathing (including washing, rinsing, drying)?: A Little ?Help from another person to put on and taking off regular upper body clothing?: A Little ?Help from another person to put on and taking off regular lower body clothing?: A Lot ?6 Click Score: 17 ?  ?End of Session Equipment Utilized During Treatment: Oxygen ?Nurse Communication: Mobility status ? ?Activity Tolerance: Patient tolerated treatment well ?Patient left: in bed;with call bell/phone within reach;with bed alarm set;with family/visitor present ? ?OT Visit Diagnosis: Other abnormalities of gait and mobility (R26.89);Muscle weakness (generalized) (M62.81)  ?              ?Time: 2355-7322 ?OT Time Calculation (min): 24 min ?Charges:  OT General Charges ?$OT Visit: 1 Visit ?OT Evaluation ?$OT Eval Low Complexity: 1 Low ? ?Malachy Chamber, OTR/L ?Acute Rehab Services ?Office: [phone]  ? ?Layla Maw ?05/23/2021, 12:25 PM ?

## 2021-05-23 NOTE — Progress Notes (Addendum)
?[facility] ?Progress Note  ? ?Subjective: Seen in room. GI at bedside. Nausea improved. Next HD 05/23/2021. Patient alert, daughter at bedside.  ? ?Objective ?Vitals:  ? 05/23/21 0117 05/23/21 0138 05/23/21 0405 05/23/21 0820  ?BP: (!) 100/55  (!) 91/52 (!) 118/54  ?Pulse:   74 65  ?Resp: '20 19 18 16  '$ ?Temp:  98.1 ?F (36.7 ?C) 98.5 ?F (36.9 ?C) 98.5 ?F (36.9 ?C)  ?TempSrc:  Oral Oral Oral  ?SpO2:  100% 97% 100%  ?Weight:      ?Height:      ? ?Physical Exam ?General: Chronically ill appearing elderly female in NAD ?Heart: S1,S2 RRR No M/R/G ?Lungs: CTAB A/P ?Abdomen: colostomy present, green drainage. NABS,  ?Extremities: No LE edema.  ?Dialysis Access: [facility]  Drsg CDI.  ? ? ?Additional Objective ?Labs: ?Basic Metabolic Panel: ?Recent Labs  ?Lab 05/22/21 ?1334 05/22/21 ?1710 05/22/21 ?2020 05/23/21 ?0209  ?NA 137 134* 136 136  ?K 5.8* 5.9* 5.1 4.1  ?CL 102  --  101 102  ?CO2 18*  --  21* 24  ?GLUCOSE 214*  --  144* 128*  ?BUN 33*  --  33* 12  ?CREATININE 9.83*  --  9.92* 4.86*  ?CALCIUM 10.0  --  9.6 8.2*  ?PHOS  --   --  6.2* 3.4  ? ?Liver Function Tests: ?Recent Labs  ?Lab 05/22/21 ?1334 05/22/21 ?2020 05/23/21 ?0209  ?AST 35  --   --   ?ALT 18  --   --   ?ALKPHOS 103  --   --   ?BILITOT 0.6  --   --   ?PROT 6.8  --   --   ?ALBUMIN 3.0* 2.6* 2.5*  ? ?Recent Labs  ?Lab 05/22/21 ?1334  ?LIPASE 49  ? ?CBC: ?Recent Labs  ?Lab 05/22/21 ?1334 05/22/21 ?1710 05/22/21 ?2020 05/23/21 ?0209  ?WBC 8.6  --  11.1* 13.6*  ?HGB 11.3* 11.2* 9.2* 9.2*  ?HCT 39.2 33.0* 32.8* 32.0*  ?MCV 84.8  --  82.8 80.4  ?PLT 422*  --  439* 321  ? ?Blood Culture ?   ?Component Value Date/Time  ? SDES BLOOD RIGHT WRIST 03/19/2021 1642  ? SPECREQUEST  03/19/2021 1642  ?  BOTTLES DRAWN AEROBIC AND ANAEROBIC Blood Culture adequate volume  ? CULT  03/19/2021 1642  ?  NO GROWTH 5 DAYS ?Performed at [facility], [place] [address] [place], [place] [address] ?  ? REPTSTATUS 03/24/2021 FINAL 03/19/2021 1642  ? ? ?Cardiac  Enzymes: ?No results for input(s): CKTOTAL, CKMB, CKMBINDEX, TROPONINI in the last 168 hours. ?CBG: ?Recent Labs  ?Lab 05/22/21 ?1336  ?GLUCAP 216*  ? ?Iron Studies: No results for input(s): IRON, TIBC, TRANSFERRIN, FERRITIN in the last 72 hours. ?'@lablastinr3'$ @ ?Studies/Results: ?CT ABDOMEN PELVIS WO CONTRAST ? ?Result Date: 05/22/2021 ?CLINICAL DATA:  Nausea/vomiting.  Bowel obstruction suspected. EXAM: CT ABDOMEN AND PELVIS WITHOUT CONTRAST TECHNIQUE: Multidetector CT imaging of the abdomen and pelvis was performed following the standard protocol without IV contrast. RADIATION DOSE REDUCTION: This exam was performed according to the departmental dose-optimization program which includes automated exposure control, adjustment of the mA and/or kV according to patient size and/or use of iterative reconstruction technique. COMPARISON:  CT examination dated March 20, 2020 FINDINGS: Lower chest: The heart is enlarged. Small bilateral pleural effusions. Hepatobiliary: No focal liver abnormality is seen. Status post cholecystectomy. No biliary dilatation. Pancreas: Unremarkable. No pancreatic ductal dilatation or surrounding inflammatory changes. Spleen: Normal in size without focal abnormality. Adrenals/Urinary Tract:  Adrenal glands are unremarkable. Prior right nephrectomy. Moderate left renal cortical atrophy. No appreciable mass. Bladder is unremarkable. Stomach/Bowel: Stomach is within normal limits. Appendix not visualized. Bowel loops are normal in caliber. No evidence of colitis diverticulitis or obstruction. Vascular/Lymphatic: Aortic atherosclerosis. No enlarged abdominal or pelvic lymph nodes. Reproductive: Status post hysterectomy. No adnexal masses. Other: There is a large ventral hernia containing bowel loops without evidence of obstruction the neck of the hernia measures approximately 5.8 x 6.2 cm. The hernial sac measures approximately 9.1 x 5.7 x 10 cm. There is mild skin thickening and skin irregularity  about the hernial sac, with mild fat stranding, without evidence of fluid collection or abscess. Correlate with physical examination findings. Musculoskeletal: No acute osseous abnormality. Degenerative disease of the lumbar spine. 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 IMPRESSION: 1. Bowel loops are normal in caliber. No evidence of obstruction, colitis or diverticulitis. 2. Large bowel loop containing ventral hernia measuring approximately 9.1 x 5.7 x 10 cm. The neck of the hernia measures approximately 5.8 x 6.2 cm. 3. Skin thickening and mild adjacent edema in the right anterior abdominal wall about the hernia, correlate with physical examination findings. No drainable fluid collection or abscess. 4.  Prior right nephrectomy and advanced atrophy of the left kidney. 5.  Atherosclerotic disease of abdominal aorta and branch vessels. 6. 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 Electronically Signed   By: Keane Police D.O.   On: 05/22/2021 17:05  ? ?DG Chest 2 View ? ?Result Date: 05/22/2021 ?CLINICAL DATA:  Shortness of breath EXAM: CHEST - 2 VIEW COMPARISON:  03/19/2021 FINDINGS: Transverse diameter of heart is increased. There are no signs of alveolar pulmonary edema. There is prominence of interstitial markings in the parahilar regions. Small linear densities in the left lower lung fields may suggest scarring or subsegmental atelectasis. There is no new focal pulmonary consolidation. There is no pleural effusion or pneumothorax. Tip of dialysis catheter is seen in the right atrium. Implantable cardiac monitoring device is noted in the left chest wall. IMPRESSION: Cardiomegaly. There is prominence of interstitial markings in the parahilar regions which may suggest mild interstitial edema or interstitial pneumonia. There is no new focal pulmonary consolidation. There is no pleural effusion or pneumothorax. Electronically Signed   By: Elmer Picker M.D.   On: 05/22/2021 13:50   ?Medications: ? sodium chloride    ? sodium chloride    ? ? amiodarone  200 mg Oral Daily  ? apixaban  2.5 mg Oral BID  ? Chlorhexidine Gluconate Cloth  6 each Topical Q0600  ? fluticasone  1 spray Each Nare Daily  ? levothyroxine  175 mcg Oral QAC breakfast  ? midodrine  10 mg Oral TID  ? multivitamin  1 tablet Oral QHS  ? vitamin B-12  500 mcg Oral Daily  ? ? ? ?Dialysis Orders: Center: [facility]  on TTS . ?EDW 89.5kg HD Bath 2K/2Ca  Time 3:30 Heparin 2700 units IVP. Access TDC BFR 400  DFR 600    ?Micera 225 mcg IVP every 2 weeks, calcitriol 0.5 mcg po/HD ?  ?Assessment/Plan: ? Intractable nausea and vomiting - Primary W/U for possible gastroparesis. Interestingly BUN only 33 on admission after missing a week of HD so doubt uremia. Being seen by GI. Nausea currently improved on antiemetics. Please renal dose Reglan if ordered. Per primary.  ? ESRD -  T,Th,S. Last in-center HD 05/17/2021 HD last PM  on schedule. Next HD 05/24/2021.  ?  Hyperkalemia - Resolved with HD.  ? Hypertension/volume  - HD 04/27, net UF 1600cc. Post wt not done but was very much above OP EDW.  ? Anemia  - stable ? Metabolic bone disease -   continue with home meds ? Nutrition - advance as tolerated ?H/O DMT2 not on medications ?H/O PAF on Eliquis.  ?Hypothyroidism-per primary ?H/O R hemicolectomy with colostomy ? ?Rita H. Brown NP-C ?05/23/2021, 12:03 PM  ?[facility] ?[phone] ? ?Nephrology attending: ?I have personally seen and examined the patient this morning. Chart reviewed and I agree w/above. ?  ?Pt reports feeling much better. No N/V this morning and able to tolerate liquid well. GI is following and planning anti-emetics but no EGD. Regular HD tomorrow per TTS schedule.  ?Pt's daughter was presented at bedside. ? ?D. Mailani Degroote, CKA. ? ? ?  ? ?

## 2021-05-23 NOTE — Progress Notes (Addendum)
Family Medicine Teaching Service ?Daily Progress Note ?Intern Pager: [phone] ? ?Patient name: [name] Medical record number: [id number] ?Date of birth: [birth year] Age: 81 y.o. Gender: female ? ?Primary Care Provider: [name], MD ?Consultants: Consider GI, Nephro ?Code Status: FULL  ? ?Pt Overview and Major Events to Date:  ?05/22/21: Admitted to [facility]  ? ?Assessment and Plan: ? ?[name] is a 81 y.o. female presenting with poor PO intake and volume overload in the setting of missed HD. 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  ?  ?Volume Overload  Missed HD  ESRD on HD  Electrolyte Derangements  ?Anemia of Chronic Disease  ?Hx of CAD ?Previously had not had HD for about a week. Patient has emergent HD 05/22/21, EDW per nephro 89.5 kg, 93.4 kg on admission though weights are not reliable. Hgb 9.2 and stable, electrolytes wnl.  ?- Nephrology following, appreciate recommendations  ?- HD yesterday, nephro to set schedule  ?- Continued home Rena Vit and Vit B12  ? 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 Patient's general surgeon, who placed colostomy with hemicolectomy and end ileostomy has recommended a referral to GI for possible EGD given consistent complaint of nausea and vomiting. Will consider GI consult inpatient for possible EGD and recommendations, sees [place] outpatient.  ?- Compazine 10 mg q6PRN  ?- GI consult, appreciate recs  ?- Continue with HD  ?- wound care to assist with ostomy  ?- Advancing diet as tolerated  ? 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  Maintaining pressures this AM. PT/OT will evaluate and treat.  ? ?pAFib on Eliquis  G2DD  ?On eliquis dosing for ESRD as well as Amiodarone. BNP 176.8>767, last echo 01/2021 LVEF 55% without regional wall motion abnormalities. Mild LVH with G2DD. Unlikely acute exacerbation of heart failure as patient has not had HD in a week and has a recent echo. Had been on 3L [place]> now to RA.  ?- Continue Eliquis and Amiodarone  ?- Monitor volume status  ? ?Chronic and Stable:  ?T2DM: Diet controlled, CBGs 150 > 216  ?Hypothyroidism: Continue home Synthroid 175 mcg daily ?Anxiety: On Klonopin at home, takes 1 Mg on nondialysis days and 0.5 Mg on dialysis days.  Continuing home Klonopin dosing. ?GERD: Continue home Pepcid 20 mg daily ? ?FEN/GI: CLD>consider advancement with improvement of sx  ?PPx: Eliquis  ?Dispo:Pending PT recommendations  pending clinical improvement . Barriers include pending further GI work up.  ? ?Subjective:  ?Patient is still experiencing N/V after HD last night. Sees good improvement with Compazine. Denies any new food intake or sick contacts. Patient experiences these symptoms after eating sweet foods per daughter  ? ?Objective: ?Temp:  [97.7 ?F (36.5 ?C)-98.8 ?F (37.1 ?C)] 98.5 ?F (36.9 ?C) (04/28 0820) ?Pulse Rate:  [64-136] 65 (04/28 0820) ?Resp:  [14-26] 16 (04/28 0820) ?BP: (60-155)/(19-85) 118/54 (04/28 0820) ?SpO2:  [94 %-100 %] 100 % (04/28 0820) ?Weight:  [93.4 kg] 93.4 kg (04/27 1330) ?General: Groggy but appropriately responsive to questioning and touch, daughter at bedside  ?Heart: Regular rate and rhythm with no murmurs appreciated ?Lungs: CTA bilaterally, no  wheezing ?Abdomen: Bowel sounds present, no abdominal pain, colostomy in place  ?Skin: Warm and dry ?Extremities: No lower extremity edema at present  ? ?Laboratory: ?Recent Labs  ?Lab 05/22/21 ?1334 05/22/21 ?1710 05/22/21 ?2020 05/23/21 ?0209  ?WBC 8.6  --  11.1* 13.6*  ?HGB 11.3* 11.2* 9.2* 9.2*  ?HCT 39.2 33.0* 32.8* 32.0*  ?PLT  422*  --  439* 321  ? ?Recent Labs  ?Lab 05/22/21 ?1334 05/22/21 ?1710 05/22/21 ?2020 05/23/21 ?0209  ?NA 137 134* 136 136  ?K 5.8* 5.9* 5.1 4.1  ?CL 102  --  101 102  ?CO2 18*  --  21* 24  ?BUN 33*  --  33* 12  ?CREATININE 9.83*  --  9.92* 4.86*  ?CALCIUM 10.0  --  9.6 8.2*  ?PROT 6.8  --   --   --   ?BILITOT 0.6  --   --   --   ?ALKPHOS 103  --   --   --   ?ALT 18  --   --   --   ?AST 35  --   --   --   ?GLUCOSE 214*  --  144* 128*  ? ? ? ? ?Imaging/Diagnostic Tests: ?CT ABDOMEN PELVIS WO CONTRAST ? ?Result Date: 05/22/2021 ?CLINICAL DATA:  Nausea/vomiting.  Bowel obstruction suspected. EXAM: CT ABDOMEN AND PELVIS WITHOUT CONTRAST TECHNIQUE: Multidetector CT imaging of the abdomen and pelvis was performed following the standard protocol without IV contrast. RADIATION DOSE REDUCTION: This exam was performed according to the departmental dose-optimization program which includes automated exposure control, adjustment of the mA and/or kV according to patient size and/or use of iterative reconstruction technique. COMPARISON:  CT examination dated March 20, 2020 FINDINGS: Lower chest: The heart is enlarged. Small bilateral pleural effusions. Hepatobiliary: No focal liver abnormality is seen. Status post cholecystectomy. No biliary dilatation. Pancreas: Unremarkable. No pancreatic ductal dilatation or surrounding inflammatory changes. Spleen: Normal in size without focal abnormality. Adrenals/Urinary Tract: Adrenal glands are unremarkable. Prior right nephrectomy. Moderate left renal cortical atrophy. No appreciable mass. Bladder is unremarkable. Stomach/Bowel: Stomach is within normal limits. Appendix not visualized. Bowel loops are normal in caliber. No evidence of colitis diverticulitis or obstruction. Vascular/Lymphatic: Aortic atherosclerosis. No enlarged abdominal or pelvic lymph nodes. Reproductive: Status post hysterectomy. No adnexal masses. Other: There is a large ventral hernia containing bowel loops without  evidence of obstruction the neck of the hernia measures approximately 5.8 x 6.2 cm. The hernial sac measures approximately 9.1 x 5.7 x 10 cm. There is mild skin thickening and skin irregularity about the hernial sac, with mild fat stranding, without evidence of fluid collection or abscess. Correlate with physical examination findings. Musculoskeletal: No acute osseous abnormality. Degenerative disease of the lumbar spine. 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 IMPRESSION: 1. Bowel loops are normal in caliber. No evidence of obstruction, colitis or diverticulitis. 2. Large bowel loop containing ventral hernia measuring approximately 9.1 x 5.7 x 10 cm. The neck of the hernia measures approximately 5.8 x 6.2 cm. 3. Skin thickening and mild adjacent edema in the right anterior abdominal wall about the hernia, correlate with physical examination findings. No drainable fluid collection or abscess. 4.  Prior right nephrectomy and advanced atrophy of the left kidney. 5.  Atherosclerotic disease of abdominal aorta and branch vessels. 6. 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 Electronically Signed   By: Keane Police D.O.   On: 05/22/2021 17:05  ? ?DG Chest 2 View ? ?Result  Date: 05/22/2021 ?CLINICAL DATA:  Shortness of breath EXAM: CHEST - 2 VIEW COMPARISON:  03/19/2021 FINDINGS: Transverse diameter of heart is increased. There are no signs of alveolar pulmonary edema. There is prominence of interstitial markings in the parahilar regions. Small linear densities in the left lower lung fields may suggest scarring or subsegmental atelectasis. There is no new focal pulmonary consolidation. There is no pleural effusion or pneumothorax. Tip of dialysis catheter is seen in the right atrium. Implantable cardiac monitoring device is noted in the left chest wall. IMPRESSION: Cardiomegaly. There is prominence of interstitial markings in the parahilar regions which  may suggest mild interstitial edema or interstitial pneumonia. There is no new focal pulmonary consolidation. There is no pleural effusion or pneumothorax. Electronically Signed   By: Elmer Picker M.D.   On: 04/2

## 2021-05-23 NOTE — Progress Notes (Signed)
PT Cancellation Note ? ?Patient Details ?Name: [name] ?MRN: [id number] ?DOB: Jun 08, 1940 ? ? ?Cancelled Treatment:    Reason Eval/Treat Not Completed: PT screened, no needs identified, will sign off; entered as OT finishing evaluation.  She reports pt is at her baseline and pt and family in the room in agreement.  Not able today to make it to the chair due to dizzy from nausea meds.  Patient not willing to mobilize again today and plans for HD tomorrow.  Feel no current PT needs.  Will sign off.  ? ? ?Reginia Naas ?05/23/2021, 11:52 AM ?Magda Kiel, PT ?Acute Rehabilitation Services ?ONGEX:[phone] ?Office:[phone] ?05/23/2021 ? ?

## 2021-05-23 NOTE — Progress Notes (Addendum)
Pt receives out-pt HD at [facility] on TTS. Pt arrives at 11:30 for 11:50 chair time. Will assist as needed.  ? ?Melven Sartorius ?Renal Navigator ?[phone] ?

## 2021-05-23 NOTE — TOC CM/SW Note (Addendum)
Received a call from [place] [phone] with  [facility] , patient is active with them for  Palliative Care program.  ?

## 2021-05-24 DIAGNOSIS — N186 End stage renal disease: Secondary | ICD-10-CM | POA: Diagnosis not present

## 2021-05-24 DIAGNOSIS — R112 Nausea with vomiting, unspecified: Secondary | ICD-10-CM

## 2021-05-24 DIAGNOSIS — E118 Type 2 diabetes mellitus with unspecified complications: Secondary | ICD-10-CM | POA: Diagnosis not present

## 2021-05-24 DIAGNOSIS — K219 Gastro-esophageal reflux disease without esophagitis: Secondary | ICD-10-CM | POA: Diagnosis not present

## 2021-05-24 DIAGNOSIS — I132 Hypertensive heart and chronic kidney disease with heart failure and with stage 5 chronic kidney disease, or end stage renal disease: Secondary | ICD-10-CM | POA: Diagnosis not present

## 2021-05-24 DIAGNOSIS — Z1509 Genetic susceptibility to other malignant neoplasm: Secondary | ICD-10-CM | POA: Diagnosis not present

## 2021-05-24 LAB — RENAL FUNCTION PANEL
Albumin: 2.5 g/dL — ABNORMAL LOW (ref 3.5–5.0)
Albumin: 2.5 g/dL — ABNORMAL LOW (ref 3.5–5.0)
Anion gap: 9 (ref 5–15)
Anion gap: 9 (ref 5–15)
BUN: 18 mg/dL (ref 8–23)
BUN: <5 mg/dL — ABNORMAL LOW (ref 8–23)
CO2: 26 mmol/L (ref 22–32)
CO2: 27 mmol/L (ref 22–32)
Calcium: 8.9 mg/dL (ref 8.9–10.3)
Calcium: 7.9 mg/dL — ABNORMAL LOW (ref 8.9–10.3)
Chloride: 99 mmol/L (ref 98–111)
Chloride: 99 mmol/L (ref 98–111)
Creatinine, Ser: 6.07 mg/dL — ABNORMAL HIGH (ref 0.44–1.00)
Creatinine, Ser: 2.41 mg/dL — ABNORMAL HIGH (ref 0.44–1.00)
GFR, Estimated: 7 mL/min — ABNORMAL LOW (ref >60)
GFR, Estimated: 20 mL/min — ABNORMAL LOW (ref >60)
Glucose, Bld: 143 mg/dL — ABNORMAL HIGH (ref 70–99)
Glucose, Bld: 121 mg/dL — ABNORMAL HIGH (ref 70–99)
Phosphorus: 6.3 mg/dL — ABNORMAL HIGH (ref 2.5–4.6)
Phosphorus: 2.8 mg/dL (ref 2.5–4.6)
Potassium: 5.2 mmol/L — ABNORMAL HIGH (ref 3.5–5.1)
Potassium: 4 mmol/L (ref 3.5–5.1)
Sodium: 134 mmol/L — ABNORMAL LOW (ref 135–145)
Sodium: 135 mmol/L (ref 135–145)

## 2021-05-24 LAB — CBC
HCT: 30.7 % — ABNORMAL LOW (ref 36.0–46.0)
HCT: 31.8 % — ABNORMAL LOW (ref 36.0–46.0)
Hemoglobin: 9.1 g/dL — ABNORMAL LOW (ref 12.0–15.0)
Hemoglobin: 9.5 g/dL — ABNORMAL LOW (ref 12.0–15.0)
MCH: 23.9 pg — ABNORMAL LOW (ref 26.0–34.0)
MCH: 24 pg — ABNORMAL LOW (ref 26.0–34.0)
MCHC: 29.6 g/dL — ABNORMAL LOW (ref 30.0–36.0)
MCHC: 29.9 g/dL — ABNORMAL LOW (ref 30.0–36.0)
MCV: 80.8 fL (ref 80.0–100.0)
MCV: 80.3 fL (ref 80.0–100.0)
Platelets: 358 10*3/uL (ref 150–400)
Platelets: 311 10*3/uL (ref 150–400)
RBC: 3.8 MIL/uL — ABNORMAL LOW (ref 3.87–5.11)
RBC: 3.96 MIL/uL (ref 3.87–5.11)
RDW: 20.1 % — ABNORMAL HIGH (ref 11.5–15.5)
RDW: 20 % — ABNORMAL HIGH (ref 11.5–15.5)
WBC: 9.8 10*3/uL (ref 4.0–10.5)
WBC: 10.2 10*3/uL (ref 4.0–10.5)
nRBC: 0 % (ref 0.0–0.2)
nRBC: 0 % (ref 0.0–0.2)

## 2021-05-24 LAB — HEPATITIS B SURFACE ANTIBODY, QUANTITATIVE: Hep B S AB Quant (Post): <3.1 m[IU]/mL — ABNORMAL LOW (ref >9.9)

## 2021-05-24 MED ORDER — SODIUM CHLORIDE 0.9 % IV SOLN: 100.0000 mL | INTRAVENOUS | Status: DC | PRN

## 2021-05-24 MED ORDER — MIDODRINE HCL 5 MG PO TABS
ORAL_TABLET | ORAL | Status: AC
Start: 2021-05-24 — End: 2021-05-24
  Administered 2021-05-24: 10 mg via ORAL
  Filled 2021-05-24: qty 2

## 2021-05-24 MED ORDER — METOCLOPRAMIDE HCL 5 MG PO TABS
5.0000 mg | ORAL_TABLET | Freq: Three times a day (TID) | ORAL | Status: DC
  Administered 2021-05-24 – 2021-05-26 (×9): 5 mg via ORAL
  Filled 2021-05-24 (×9): qty 1

## 2021-05-24 MED ORDER — HEPARIN SODIUM (PORCINE) 1000 UNIT/ML DIALYSIS
2700.0000 [IU] | Freq: Once | INTRAMUSCULAR | Status: DC
  Filled 2021-05-24: qty 3

## 2021-05-24 NOTE — Progress Notes (Addendum)
?[facility] ?Progress Note  ? ?Subjective: Seen prior to starting HD. Still some C/O nausea but no emesis. Says she feels like choking, thinks she is allergic to reglan but has only had one dose. Otherwise pleasant. UF as tolerated.  ? ?Objective ?Vitals:  ? 05/23/21 1434 05/23/21 1541 05/23/21 1948 05/24/21 0539  ?BP: (!) 131/50 (!) 134/56 (!) 154/65 99/61  ?Pulse: 64 67 68 60  ?Resp: '12 17 18 17  '$ ?Temp: 98 ?F (36.7 ?C) 98.3 ?F (36.8 ?C) 98.5 ?F (36.9 ?C) 98.4 ?F (36.9 ?C)  ?TempSrc: Oral Oral Oral Oral  ?SpO2: 100% 100% 100% 100%  ?Weight:      ?Height:      ? ?Physical Exam ?General: Chronically ill appearing elderly female in NAD ?Heart: S1,S2 RRR No M/R/G ?Lungs: CTAB A/P ?Abdomen: colostomy present, green drainage. NABS,  ?Extremities: No LE edema.  ?Dialysis Access: [facility]  [facility].  ? ? ? ?Additional Objective ?Labs: ?Basic Metabolic Panel: ?Recent Labs  ?Lab 05/22/21 ?2020 05/23/21 ?0209 05/24/21 ?0041  ?NA 136 136 134*  ?K 5.1 4.1 5.2*  ?CL 101 102 99  ?CO2 21* 24 26  ?GLUCOSE 144* 128* 143*  ?BUN 33* 12 18  ?CREATININE 9.92* 4.86* 6.07*  ?CALCIUM 9.6 8.2* 8.9  ?PHOS 6.2* 3.4 6.3*  ? ?Liver Function Tests: ?Recent Labs  ?Lab 05/22/21 ?1334 05/22/21 ?2020 05/23/21 ?0209 05/24/21 ?0041  ?AST 35  --   --   --   ?ALT 18  --   --   --   ?ALKPHOS 103  --   --   --   ?BILITOT 0.6  --   --   --   ?PROT 6.8  --   --   --   ?ALBUMIN 3.0* 2.6* 2.5* 2.5*  ? ?Recent Labs  ?Lab 05/22/21 ?1334  ?LIPASE 49  ? ?CBC: ?Recent Labs  ?Lab 05/22/21 ?1334 05/22/21 ?1710 05/22/21 ?2020 05/23/21 ?0209 05/24/21 ?0041  ?WBC 8.6  --  11.1* 13.6* 9.8  ?HGB 11.3*   < > 9.2* 9.2* 9.1*  ?HCT 39.2   < > 32.8* 32.0* 30.7*  ?MCV 84.8  --  82.8 80.4 80.8  ?PLT 422*  --  439* 321 358  ? < > = values in this interval not displayed.  ? ?Blood Culture ?   ?Component Value Date/Time  ? SDES BLOOD RIGHT WRIST 03/19/2021 1642  ? SPECREQUEST  03/19/2021 1642  ?  BOTTLES DRAWN AEROBIC AND ANAEROBIC Blood Culture adequate volume  ?  CULT  03/19/2021 1642  ?  NO GROWTH 5 DAYS ?Performed at [facility], [place] [address] [place], [place] [address] ?  ? REPTSTATUS 03/24/2021 FINAL 03/19/2021 1642  ? ? ?Cardiac Enzymes: ?No results for input(s): CKTOTAL, CKMB, CKMBINDEX, TROPONINI in the last 168 hours. ?CBG: ?Recent Labs  ?Lab 05/22/21 ?1336  ?GLUCAP 216*  ? ?Iron Studies: No results for input(s): IRON, TIBC, TRANSFERRIN, FERRITIN in the last 72 hours. ?'@lablastinr3'$ @ ?Studies/Results: ?CT ABDOMEN PELVIS WO CONTRAST ? ?Result Date: 05/22/2021 ?CLINICAL DATA:  Nausea/vomiting.  Bowel obstruction suspected. EXAM: CT ABDOMEN AND PELVIS WITHOUT CONTRAST TECHNIQUE: Multidetector CT imaging of the abdomen and pelvis was performed following the standard protocol without IV contrast. RADIATION DOSE REDUCTION: This exam was performed according to the departmental dose-optimization program which includes automated exposure control, adjustment of the mA and/or kV according to patient size and/or use of iterative reconstruction technique. COMPARISON:  CT examination dated March 20, 2020 FINDINGS: Lower chest: The heart is  enlarged. Small bilateral pleural effusions. Hepatobiliary: No focal liver abnormality is seen. Status post cholecystectomy. No biliary dilatation. Pancreas: Unremarkable. No pancreatic ductal dilatation or surrounding inflammatory changes. Spleen: Normal in size without focal abnormality. Adrenals/Urinary Tract: Adrenal glands are unremarkable. Prior right nephrectomy. Moderate left renal cortical atrophy. No appreciable mass. Bladder is unremarkable. Stomach/Bowel: Stomach is within normal limits. Appendix not visualized. Bowel loops are normal in caliber. No evidence of colitis diverticulitis or obstruction. Vascular/Lymphatic: Aortic atherosclerosis. No enlarged abdominal or pelvic lymph nodes. Reproductive: Status post hysterectomy. No adnexal masses. Other: There is a large ventral hernia containing bowel loops without evidence  of obstruction the neck of the hernia measures approximately 5.8 x 6.2 cm. The hernial sac measures approximately 9.1 x 5.7 x 10 cm. There is mild skin thickening and skin irregularity about the hernial sac, with mild fat stranding, without evidence of fluid collection or abscess. Correlate with physical examination findings. Musculoskeletal: No acute osseous abnormality. Degenerative disease of the lumbar spine. 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 IMPRESSION: 1. Bowel loops are normal in caliber. No evidence of obstruction, colitis or diverticulitis. 2. Large bowel loop containing ventral hernia measuring approximately 9.1 x 5.7 x 10 cm. The neck of the hernia measures approximately 5.8 x 6.2 cm. 3. Skin thickening and mild adjacent edema in the right anterior abdominal wall about the hernia, correlate with physical examination findings. No drainable fluid collection or abscess. 4.  Prior right nephrectomy and advanced atrophy of the left kidney. 5.  Atherosclerotic disease of abdominal aorta and branch vessels. 6. 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 Electronically Signed   By: Keane Police D.O.   On: 05/22/2021 17:05  ? ?DG Chest 2 View ? ?Result Date: 05/22/2021 ?CLINICAL DATA:  Shortness of breath EXAM: CHEST - 2 VIEW COMPARISON:  03/19/2021 FINDINGS: Transverse diameter of heart is increased. There are no signs of alveolar pulmonary edema. There is prominence of interstitial markings in the parahilar regions. Small linear densities in the left lower lung fields may suggest scarring or subsegmental atelectasis. There is no new focal pulmonary consolidation. There is no pleural effusion or pneumothorax. Tip of dialysis catheter is seen in the right atrium. Implantable cardiac monitoring device is noted in the left chest wall. IMPRESSION: Cardiomegaly. There is prominence of interstitial markings in the parahilar regions which may  suggest mild interstitial edema or interstitial pneumonia. There is no new focal pulmonary consolidation. There is no pleural effusion or pneumothorax. Electronically Signed   By: Elmer Picker M.D.   On: 05/22/2021 13:50   ?Medications: ? sodium chloride    ? sodium chloride    ? ? amiodarone  200 mg Oral Daily  ? apixaban  2.5 mg Oral BID  ? Chlorhexidine Gluconate Cloth  6 each Topical Q0600  ? famotidine  20 mg Oral Daily  ? fluticasone  1 spray Each Nare Daily  ? levothyroxine  175 mcg Oral QAC breakfast  ? metoCLOPramide (REGLAN) injection  5 mg Intravenous TID AC & HS  ? midodrine  10 mg Oral TID  ? multivitamin  1 tablet Oral QHS  ? vitamin B-12  500 mcg Oral Daily  ? ? ? ?Dialysis Orders: Center: [place]  on TTS . ?EDW 89.5kg HD Bath 2K/2Ca  Time 3:30 Heparin 2700 units IVP. Access TDC BFR 400  DFR 600    ?Micera 225 mcg IVP every 2 weeks, calcitriol 0.5 mcg po/HD ?  ?Assessment/Plan: ? Intractable nausea and  vomiting - Primary W/U for possible gastroparesis. Interestingly BUN only 33 on admission after missing a week of HD so doubt uremia. Being seen by GI. Nausea currently improved on antiemetics. Please renal dose Reglan if ordered. Per primary.  ? ESRD -  T,Th,S. Last in-center HD 05/17/2021 HD last PM  on schedule. Next HD 05/24/2021.  ? Hyperkalemia - Resolved with HD.  ? Hypertension/volume  - HD 04/27, net UF 1600cc. Post wt not done but was very much above OP EDW.  ? Anemia  - stable ? Metabolic bone disease -   continue with home meds ? Nutrition - advance as tolerated ?H/O DMT2 not on medications ?H/O PAF on Eliquis.  ?Hypothyroidism-per primary ?H/O R hemicolectomy with colostomy ?  ? ?Rita H. Brown NP-C ?05/24/2021, 8:27 AM  ?[facility] ?[phone] ? ?Nephrology attending: ?I have personally seen and examined the patient at dialysis unit.  Chart reviewed.  I agree with above. ?Patient reported that her nausea and vomiting is improving.  She is tolerating dialysis well.   Fluctuation in phosphorus level.  Given ongoing GI symptoms, I will not add phosphorus binder today. ? ?D.  Carolin Sicks, ?CKA. ? ? ?  ? ?

## 2021-05-24 NOTE — Progress Notes (Signed)
? ? ?Progress Note ? ? ?Assessment   ? ?*Recurrent nausea vomiting likely due to GERD and gastroparesis. She has intolerances to omeprazole, pantoprazole, esomeprazole. She did not like how IV metoclopramide made her feel so she declined additional doses.  ?*Lynch syndrome S/P right hemicolectomy.  ?*ESRD on HD ? ?Recommendations  ? ?*Continue famotidine 20 mg qd long term ?*Continue prochlorperazine 5-10 mg po or IV q6h prn N/V in hospital and at home ?*Change metoclopramide to 5 mg po ac & hs for a several week trial  ?*Advance to full liquids today and advance only to a soft diet as tolerated ?*Follow antireflux measures and gastroparesis modifications long term ?*If she tolerates advancing her diet no further GI evaluation is planned and she would be OK for discharge from GI standpoint ? ? ?Chief Complaint  ? ?Nausea improved. No vomiting. PO intake improving. Returned from HD and feels better.  ? ?Vital signs in last 24 hours: ?Temp:  [97.6 ?F (36.4 ?C)-98.5 ?F (36.9 ?C)] 97.7 ?F (36.5 ?C) (04/29 1315) ?Pulse Rate:  [60-68] 62 (04/29 1315) ?Resp:  [12-20] 18 (04/29 1315) ?BP: (91-158)/(49-83) 122/52 (04/29 1315) ?SpO2:  [100 %] 100 % (04/29 1315) ?Weight:  [85.8 kg-86.8 kg] 85.8 kg (04/29 1147) ?Last BM Date : 05/23/21 ? ?General: Alert, well-developed, elderly, in NAD ?Heart:  Regular rate and rhythm; no murmurs ?Chest: Clear to ascultation bilaterally ?Abdomen:  Soft, nontender and nondistended. Normal bowel sounds, without guarding, and without rebound.   ?Extremities:  Without edema. ?Neurologic:  Alert and  oriented x4; grossly normal neurologically. ?Psych:  Alert and cooperative. Normal mood and affect. ? ?Intake/Output from previous day: ?04/28 0701 - 04/29 0700 ?In: -  ?Out: 200 [Stool:200] ?Intake/Output this shift: ?Total I/O ?In: -  ?Out: 6967 [Other:1034] ? ?Lab Results: ?Recent Labs  ?  05/23/21 ?0209 05/24/21 ?0041 05/24/21 ?1241  ?WBC 13.6* 9.8 10.2  ?HGB 9.2* 9.1* 9.5*  ?HCT 32.0* 30.7* 31.8*   ?PLT 321 358 311  ? ?BMET ?Recent Labs  ?  05/23/21 ?0209 05/24/21 ?0041 05/24/21 ?1241  ?NA 136 134* 135  ?K 4.1 5.2* 4.0  ?CL 102 99 99  ?CO2 '24 26 27  '$ ?GLUCOSE 128* 143* 121*  ?BUN 12 18 <5*  ?CREATININE 4.86* 6.07* 2.41*  ?CALCIUM 8.2* 8.9 7.9*  ? ?LFT ?Recent Labs  ?  05/22/21 ?1334 05/22/21 ?2020 05/24/21 ?1241  ?PROT 6.8  --   --   ?ALBUMIN 3.0*   < > 2.5*  ?AST 35  --   --   ?ALT 18  --   --   ?ALKPHOS 103  --   --   ?BILITOT 0.6  --   --   ? < > = values in this interval not displayed.  ? ? ?Hepatitis Panel ?Recent Labs  ?  05/23/21 ?0209  ?HEPBSAG NON REACTIVE  ? ? ?Studies/Results: ?CT ABDOMEN PELVIS WO CONTRAST ? ?Result Date: 05/22/2021 ?CLINICAL DATA:  Nausea/vomiting.  Bowel obstruction suspected. EXAM: CT ABDOMEN AND PELVIS WITHOUT CONTRAST TECHNIQUE: Multidetector CT imaging of the abdomen and pelvis was performed following the standard protocol without IV contrast. RADIATION DOSE REDUCTION: This exam was performed according to the departmental dose-optimization program which includes automated exposure control, adjustment of the mA and/or kV according to patient size and/or use of iterative reconstruction technique. COMPARISON:  CT examination dated March 20, 2020 FINDINGS: Lower chest: The heart is enlarged. Small bilateral pleural effusions. Hepatobiliary: No focal liver abnormality is seen. Status post cholecystectomy. No biliary dilatation. Pancreas:  Unremarkable. No pancreatic ductal dilatation or surrounding inflammatory changes. Spleen: Normal in size without focal abnormality. Adrenals/Urinary Tract: Adrenal glands are unremarkable. Prior right nephrectomy. Moderate left renal cortical atrophy. No appreciable mass. Bladder is unremarkable. Stomach/Bowel: Stomach is within normal limits. Appendix not visualized. Bowel loops are normal in caliber. No evidence of colitis diverticulitis or obstruction. Vascular/Lymphatic: Aortic atherosclerosis. No enlarged abdominal or pelvic lymph nodes.  Reproductive: Status post hysterectomy. No adnexal masses. Other: There is a large ventral hernia containing bowel loops without evidence of obstruction the neck of the hernia measures approximately 5.8 x 6.2 cm. The hernial sac measures approximately 9.1 x 5.7 x 10 cm. There is mild skin thickening and skin irregularity about the hernial sac, with mild fat stranding, without evidence of fluid collection or abscess. Correlate with physical examination findings. Musculoskeletal: No acute osseous abnormality. Degenerative disease of the lumbar spine. 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 IMPRESSION: 1. Bowel loops are normal in caliber. No evidence of obstruction, colitis or diverticulitis. 2. Large bowel loop containing ventral hernia measuring approximately 9.1 x 5.7 x 10 cm. The neck of the hernia measures approximately 5.8 x 6.2 cm. 3. Skin thickening and mild adjacent edema in the right anterior abdominal wall about the hernia, correlate with physical examination findings. No drainable fluid collection or abscess. 4.  Prior right nephrectomy and advanced atrophy of the left kidney. 5.  Atherosclerotic disease of abdominal aorta and branch vessels. 6. 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 Electronically Signed   By: Keane Police D.O.   On: 05/22/2021 17:05  ? ?DG Chest 2 View ? ?Result Date: 05/22/2021 ?CLINICAL DATA:  Shortness of breath EXAM: CHEST - 2 VIEW COMPARISON:  03/19/2021 FINDINGS: Transverse diameter of heart is increased. There are no signs of alveolar pulmonary edema. There is prominence of interstitial markings in the parahilar regions. Small linear densities in the left lower lung fields may suggest scarring or subsegmental atelectasis. There is no new focal pulmonary consolidation. There is no pleural effusion or pneumothorax. Tip of dialysis catheter is seen in the right atrium. Implantable cardiac monitoring device is  noted in the left chest wall. IMPRESSION: Cardiomegaly. There is prominence of interstitial markings in the parahilar regions which may suggest mild interstitial edema or interstitial pneumonia. There is no new focal pulmonary consolidation. There is no pleural effusion or pneumothorax. Electronically Signed   By: Elmer Picker M.D.   On: 05/22/2021 13:50   ? ? ? LOS: 0 days  ? ?Pricilla Riffle. Fuller Plan, MD 05/24/2021, 1:38 PM ?See [facility], to contact our on call provider ?  ?

## 2021-05-24 NOTE — Progress Notes (Signed)
Family Medicine Teaching Service ?Daily Progress Note ?Intern Pager: [phone] ? ?Patient name: [name] Medical record number: [id number] ?Date of birth: 14-Sep-1940 Age: 81 y.o. Gender: female ? ?Primary Care Provider: [name], MD ?Consultants: GI, Nephrology ?Code Status: Full ? ?Pt Overview and Major Events to Date:  ?4/27- admitted ? ?Assessment and Plan: ?[name] is a 81 y.o. female presenting with poor PO intake and volume overload in the setting of missed HD. 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  ? ?ESRD on HD  ?Dialyzed emergently on 05/22/21. Seen on HD this am. Feeling better. Volume status much improved, weaned from 3L [place] to RA. Wt this am 86.8kg, reported dry weight is 89.5kg, suspect one of these is unreliable.  ?- Nephro following, appreciate recs ?- HD per nephro ?- Continue home RenaVit and Vit B12 ? ?Poor PO Intake with Recurrent N/V  ?Lynch Syndrome s/p R hemicolectomy ?Gastroparesis ?PO intake improving, reports she feels up to eating more today. Suspect symptoms primarily 2/2 gastroparesis. Did not like the way IV reglan made her feel, refusing further injections. Per GI, hold off on EGD for now.  ?- Transition metoclopramide IV to PO AC and QHS ?- Frequent small meals ?- Compazine '10mg'$  q6h PRN ?- WOC following for colostomy care ?- Advance diet as tolerated, will try FLD today ? ?Hypotension ?BP actually a bit high this am ahead of HD (144/64), anticipate decrease after HD. ?- Continue midodrine '10mg'$  TID ? ?Anemia of CKD ?Hgb stable 9.2>9.1. ? ?Paroxysmal A Fib on Eliquis  HFpEF ?As above, volume status much improved. Suspect volume overload secondary to missed HD and not cardiac in etiology. ?- Continue home Eliquis and amiodarone ? ?FEN/GI: CLD>FLD ?PPx: On Eliquis ?Dispo:Pending PT recommendations  pending clinical improvement .  Barriers include tolerating advancing diet.  ? ?Subjective:  ?Seen on HD. [name] reports feeling generally well but did not like the way the metoclopramide made her feel yesterday. She reports it made her feel like she was "drowning," denies having that sensation at this time. Feels up to eating a bit more today. "We'll see how it goes."  ? ?Objective: ?Temp:  [98 ?F (36.7 ?C)-98.5 ?F (36.9 ?C)] 98.4 ?F (36.9 ?C) (04/29 0539) ?Pulse Rate:  [60-68] 60 (04/29 0539) ?Resp:  [12-18] 17 (04/29 0539) ?BP: (99-154)/(50-65) 99/61 (04/29 0539) ?SpO2:  [100 %] 100 % (04/29 0539) ?Physical Exam: ?General: Chronically ill appearing, but comfortable and in good spirits ?Cardiovascular: Regular rate and rhythm ?Respiratory: Lungs clear anteriorly, normal WOB on RA ?Abdomen: Abdomen soft and non-tender, colostomy site unremarkable ?Extremities: No LE edema ? ?Laboratory: ?Recent Labs  ?Lab 05/22/21 ?2020 05/23/21 ?0209 05/24/21 ?0041  ?WBC 11.1* 13.6* 9.8  ?HGB 9.2* 9.2* 9.1*  ?HCT 32.8* 32.0* 30.7*  ?PLT 439* 321 358  ? ?Recent Labs  ?Lab 05/22/21 ?1334 05/22/21 ?1710 05/22/21 ?2020 05/23/21 ?0209 05/24/21 ?0041  ?NA 137   < > 136 136 134*  ?K 5.8*   < > 5.1 4.1 5.2*  ?CL 102  --  101 102 99  ?CO2 18*  --  21* 24 26  ?BUN 33*  --  33* 12 18  ?CREATININE 9.83*  --  9.92* 4.86* 6.07*  ?CALCIUM 10.0  --  9.6 8.2* 8.9  ?PROT 6.8  --   --   --   --   ?BILITOT 0.6  --   --   --   --   ?  ALKPHOS 103  --   --   --   --   ?ALT 18  --   --   --   --   ?AST 35  --   --   --   --   ?GLUCOSE 214*  --  144* 128* 143*  ? < > = values in this interval not displayed.  ? ? ?Imaging/Diagnostic Tests: ?No new imaging, tests ? ?Allison Gibson, MD ?05/24/2021, 7:01 AM ?PGY-1, [facility] ?[place] Intern pager: [phone], text pages welcome ? ?

## 2021-05-25 DIAGNOSIS — I132 Hypertensive heart and chronic kidney disease with heart failure and with stage 5 chronic kidney disease, or end stage renal disease: Secondary | ICD-10-CM | POA: Diagnosis not present

## 2021-05-25 DIAGNOSIS — E118 Type 2 diabetes mellitus with unspecified complications: Secondary | ICD-10-CM | POA: Diagnosis not present

## 2021-05-25 DIAGNOSIS — R112 Nausea with vomiting, unspecified: Secondary | ICD-10-CM | POA: Diagnosis not present

## 2021-05-25 DIAGNOSIS — K3184 Gastroparesis: Secondary | ICD-10-CM

## 2021-05-25 DIAGNOSIS — E875 Hyperkalemia: Secondary | ICD-10-CM | POA: Diagnosis not present

## 2021-05-25 DIAGNOSIS — N186 End stage renal disease: Secondary | ICD-10-CM | POA: Diagnosis not present

## 2021-05-25 DIAGNOSIS — E1143 Type 2 diabetes mellitus with diabetic autonomic (poly)neuropathy: Secondary | ICD-10-CM | POA: Diagnosis not present

## 2021-05-25 DIAGNOSIS — E871 Hypo-osmolality and hyponatremia: Secondary | ICD-10-CM | POA: Diagnosis not present

## 2021-05-25 DIAGNOSIS — E1122 Type 2 diabetes mellitus with diabetic chronic kidney disease: Secondary | ICD-10-CM | POA: Diagnosis not present

## 2021-05-25 LAB — CBC
HCT: 32.3 % — ABNORMAL LOW (ref 36.0–46.0)
Hemoglobin: 9.4 g/dL — ABNORMAL LOW (ref 12.0–15.0)
MCH: 23.3 pg — ABNORMAL LOW (ref 26.0–34.0)
MCHC: 29.1 g/dL — ABNORMAL LOW (ref 30.0–36.0)
MCV: 80.1 fL (ref 80.0–100.0)
Platelets: 326 10*3/uL (ref 150–400)
RBC: 4.03 MIL/uL (ref 3.87–5.11)
RDW: 19.7 % — ABNORMAL HIGH (ref 11.5–15.5)
WBC: 9.8 10*3/uL (ref 4.0–10.5)
nRBC: 0 % (ref 0.0–0.2)

## 2021-05-25 MED ORDER — ONDANSETRON 4 MG PO TBDP: 4.0000 mg | ORAL_TABLET | Freq: Once | ORAL | Status: DC | PRN

## 2021-05-25 NOTE — Progress Notes (Signed)
Family Medicine Teaching Service ?Daily Progress Note ?Intern Pager: [phone] ? ?Patient name: [name] Medical record number: [id number] ?Date of birth: 05-30-1940 Age: 81 y.o. Gender: female ? ?Primary Care Provider: [name], MD ?Consultants: GI, Nephrology ?Code Status: Full ? ?Pt Overview and Major Events to Date:  ?4/27- admitted ? ?Assessment and Plan: ?[name] is a 81 y.o. female presenting with poor PO intake and volume overload in the setting of missed HD. 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  ? ?ESRD on HD  ?Last HD session yesterday. On 2.5L, home is 3L.  ?- Nephro following, appreciate recs ?- HD per nephro ?- Continue home RenaVit and Vit B12 ? ?Poor PO Intake with Recurrent N/V  ?Lynch Syndrome s/p R hemicolectomy ?Gastroparesis ?PO intake improving,tolerated liquid diet. Suspect symptoms primarily 2/2 gastroparesis. ?- Continue metoclopramide PO  ?- Frequent small meals ?- Compazine '10mg'$  q6h PRN ?- WOC following for colostomy care ?- Advance diet as tolerated ? ?Hypotension ?- Continue midodrine '10mg'$  TID ? ?Anemia of CKD ?Hgb stable ? ?Paroxysmal A Fib on Eliquis  HFpEF ?- Continue home Eliquis and amiodarone ? ?FEN/GI: CLD>FLD>Renal  ?PPx: On Eliquis ?Dispo:Home pending clinical improvement . Barriers include tolerating advancing diet.  ? ?Subjective:  ?Continues to have nausea at the sight of her liquid diet ? ?Objective: ?Temp:  [97.6 ?F (36.4 ?C)-98.1 ?F (36.7 ?C)] 98.1 ?F (36.7 ?C) (04/30 0534) ?Pulse Rate:  [60-65] 65 (04/30 0534) ?Resp:  [14-20] 18 (04/30 0534) ?BP: (91-158)/(45-83) 138/52 (04/30 0534) ?SpO2:  [100 %] 100 % (04/30 0534) ?Weight:  [85.8 kg-86.8 kg] 85.8 kg (04/29 1147) ?Physical Exam: ?General: No acute distress. Age appropriate. Family member sleeping at bedside ?Cardiac: RRR, normal heart sounds, no  murmurs ?Respiratory: CTAB, normal effort ?Abdomen: soft, nontender, nondistended ?Extremities: No edema or cyanosis. ?Neuro: alert and oriented, no focal deficits ?Psych: normal affect ? ?Laboratory: ?Recent Labs  ?Lab 05/24/21 ?0041 05/24/21 ?1241 05/25/21 ?0009  ?WBC 9.8 10.2 9.8  ?HGB 9.1* 9.5* 9.4*  ?HCT 30.7* 31.8* 32.3*  ?PLT 358 311 326  ? ? ?Recent Labs  ?Lab 05/22/21 ?1334 05/22/21 ?1710 05/23/21 ?0209 05/24/21 ?0041 05/24/21 ?1241  ?NA 137   < > 136 134* 135  ?K 5.8*   < > 4.1 5.2* 4.0  ?CL 102   < > 102 99 99  ?CO2 18*   < > '24 26 27  '$ ?BUN 33*   < > 12 18 <5*  ?CREATININE 9.83*   < > 4.86* 6.07* 2.41*  ?CALCIUM 10.0   < > 8.2* 8.9 7.9*  ?PROT 6.8  --   --   --   --   ?BILITOT 0.6  --   --   --   --   ?ALKPHOS 103  --   --   --   --   ?ALT 18  --   --   --   --   ?AST 35  --   --   --   --   ?GLUCOSE 214*   < > 128* 143* 121*  ? < > = values in this interval not displayed.  ? ? ? ?Imaging/Diagnostic Tests: ?No new imaging, tests ? ?Allison Fee, DO ?05/25/2021, 5:40 AM ?PGY-3, [place] ?[place] Intern pager: [phone], text pages welcome ? ?

## 2021-05-25 NOTE — Progress Notes (Signed)
Mobility Specialist Progress Note  ? ? 05/25/21 1107  ?Mobility  ?Activity Transferred from bed to chair  ?Level of Assistance Contact guard assist, steadying assist  ?Assistive Device Front wheel walker  ?Activity Response Tolerated well  ?$Mobility charge 1 Mobility  ? ?Pt received in bed and agreeable. No complaints during stand and pivot. Left with call bell in reach.   ? ?[name] ?Mobility Specialist  ?Primary: 5N M.S. Phone: [phone] ?Secondary: 6N M.S. Phone: [phone] ?  ?

## 2021-05-25 NOTE — Plan of Care (Signed)
  Problem: Nutrition: Goal: Adequate nutrition will be maintained Outcome: Progressing   Problem: Pain Managment: Goal: General experience of comfort will improve Outcome: Progressing   Problem: Safety: Goal: Ability to remain free from injury will improve Outcome: Progressing   

## 2021-05-25 NOTE — Progress Notes (Signed)
FPTS Brief Note ?Reviewed patient's vitals, recent notes.  ?Vitals:  ? 05/25/21 1558 05/25/21 2036  ?BP: (!) 144/67 (!) 102/54  ?Pulse: 61 67  ?Resp: 16 16  ?Temp: 97.8 ?F (36.6 ?C) 98.4 ?F (36.9 ?C)  ?SpO2: 99% 100%  ? ?At this time, no change in plan from day progress note.  ?Zola Button, MD ?Page [phone] with questions about this patient.  ?  ?

## 2021-05-25 NOTE — Progress Notes (Signed)
Wound Treatment Associate-Certified Note: ? ?Assessed RLQ end ileostomy which had intact pouch, reinforced with a window of rectangular foams and attached to a standard drainage bag so the weight of the drainage would not pull the pouch causing leakage. Ileostomy output is medium brown in color and liquidy. Instructed pt and daughter how to switch back and forth from the pouch to the drainage bag as needed. ?Noted stage 2 pressure injury on the left heel, and applied a heel foam protector and Prevalon boot (pt has bilateral boots in room). Stage 1 pressure injury noted on right heel and instructed pt to place in the Prevalon boot for bedtime. Explained how these boots work and the importance of wearing them while in the bed to avoid any further pressure injury. ?Pt showed me the area in the lower abdominal fold which is reddened and draining a foul odor drainage in a minimal amount. This whole area was cleansed with soap and water, dried and I am ordering Interdry to place in the abdominal fold.Instructed pt/daughter how the Interdry works and its use.  ?Three small areas on the left side of the abdomen (proximal) which were measured, cleansed and applied xeroform and covered with foam dressings. All of these areas were present on admission. ? ?

## 2021-05-25 NOTE — Progress Notes (Addendum)
? [facility] ?Progress Note  ? ?Subjective: Seen in room. No issues reported overnight.    ? ?Objective ?Vitals:  ? 05/24/21 1656 05/24/21 2042 05/25/21 0534 05/25/21 0810  ?BP: (!) 127/45 (!) 158/72 (!) 138/52 (!) 110/56  ?Pulse: 64 65 65 66  ?Resp: '18 18 18 17  '$ ?Temp: 97.9 ?F (36.6 ?C) 98.1 ?F (36.7 ?C) 98.1 ?F (36.7 ?C) 97.8 ?F (36.6 ?C)  ?TempSrc: Oral Oral Oral Oral  ?SpO2: 100% 100% 100% 100%  ?Weight:      ?Height:      ? ?Physical Exam ?General: Chronically ill appearing elderly female in NAD ?Heart: S1,S2 RRR No M/R/G ?Lungs: CTAB A/P ?Abdomen: colostomy present, green drainage. NABS,  ?Extremities: No LE edema.  ?Dialysis Access: [facility].  ? ? ?Additional Objective ?Labs: ?Basic Metabolic Panel: ?Recent Labs  ?Lab 05/23/21 ?0209 05/24/21 ?0041 05/24/21 ?1241  ?NA 136 134* 135  ?K 4.1 5.2* 4.0  ?CL 102 99 99  ?CO2 '24 26 27  '$ ?GLUCOSE 128* 143* 121*  ?BUN 12 18 <5*  ?CREATININE 4.86* 6.07* 2.41*  ?CALCIUM 8.2* 8.9 7.9*  ?PHOS 3.4 6.3* 2.8  ? ?Liver Function Tests: ?Recent Labs  ?Lab 05/22/21 ?1334 05/22/21 ?2020 05/23/21 ?0209 05/24/21 ?0041 05/24/21 ?1241  ?AST 35  --   --   --   --   ?ALT 18  --   --   --   --   ?ALKPHOS 103  --   --   --   --   ?BILITOT 0.6  --   --   --   --   ?PROT 6.8  --   --   --   --   ?ALBUMIN 3.0*   < > 2.5* 2.5* 2.5*  ? < > = values in this interval not displayed.  ? ?Recent Labs  ?Lab 05/22/21 ?1334  ?LIPASE 49  ? ?CBC: ?Recent Labs  ?Lab 05/22/21 ?2020 05/23/21 ?0209 05/24/21 ?0041 05/24/21 ?1241 05/25/21 ?0009  ?WBC 11.1* 13.6* 9.8 10.2 9.8  ?HGB 9.2* 9.2* 9.1* 9.5* 9.4*  ?HCT 32.8* 32.0* 30.7* 31.8* 32.3*  ?MCV 82.8 80.4 80.8 80.3 80.1  ?PLT 439* 321 358 311 326  ? ?Blood Culture ?   ?Component Value Date/Time  ? SDES BLOOD RIGHT WRIST 03/19/2021 1642  ? SPECREQUEST  03/19/2021 1642  ?  BOTTLES DRAWN AEROBIC AND ANAEROBIC Blood Culture adequate volume  ? CULT  03/19/2021 1642  ?  NO GROWTH 5 DAYS ?Performed at [facility], [place] [address],  [place], [place] [address] ?  ? REPTSTATUS 03/24/2021 FINAL 03/19/2021 1642  ? ? ?Cardiac Enzymes: ?No results for input(s): CKTOTAL, CKMB, CKMBINDEX, TROPONINI in the last 168 hours. ?CBG: ?Recent Labs  ?Lab 05/22/21 ?1336  ?GLUCAP 216*  ? ?Iron Studies: No results for input(s): IRON, TIBC, TRANSFERRIN, FERRITIN in the last 72 hours. ?'@lablastinr3'$ @ ?Studies/Results: ?No results found. ?Medications: ? sodium chloride    ? sodium chloride    ? sodium chloride    ? sodium chloride    ? ? amiodarone  200 mg Oral Daily  ? apixaban  2.5 mg Oral BID  ? Chlorhexidine Gluconate Cloth  6 each Topical Q0600  ? famotidine  20 mg Oral Daily  ? fluticasone  1 spray Each Nare Daily  ? heparin  2,700 Units Dialysis Once in dialysis  ? levothyroxine  175 mcg Oral QAC breakfast  ? metoCLOPramide  5 mg Oral TID AC & HS  ? midodrine  10  mg Oral TID  ? multivitamin  1 tablet Oral QHS  ? vitamin B-12  500 mcg Oral Daily  ? ? ? ?Dialysis Orders: Center: [place]  on TTS . ?EDW 89.5kg HD Bath 2K/2Ca  Time 3:30 Heparin 2700 units IVP. Access TDC BFR 400  DFR 600    ?Micera 225 mcg IVP every 2 weeks, calcitriol 0.5 mcg po/HD ?  ?Assessment/Plan: ? Intractable nausea and vomiting - Primary W/U for possible gastroparesis. Interestingly BUN only 33 on admission after missing a week of HD so doubt uremia. Being seen by GI. Nausea currently improved on antiemetics. Please renal dose Reglan if ordered. Per primary.  ? ESRD -  T,Th,S. Last in-center HD 05/17/2021 HD last PM  on schedule. Next HD 05/27/2021.  ? Hyperkalemia - Resolved with HD.  ? Hypertension/volume  - BP and volume seem stable. HD 05/24/2021 Net UF just over 1 L. Under OP EDW if weights are accurate. Lower EDW on DC.  ? Anemia  - stable ? Metabolic bone disease -   continue with home meds ? Nutrition - advance as tolerated ?H/O DMT2 not on medications ?H/O PAF on Eliquis.  ?Hypothyroidism-per primary ?H/O R hemicolectomy with colostomy ?  ? ?Allison H. Brown NP-C ?05/25/2021, 11:09 AM  ?[facility] ?[phone] ? ?Nephrology attending: ?I have personally seen and examined the patient.  Chart reviewed.  I agree with above. ?ESRD on HD admitted with intractable nausea vomiting.  Seen by GI and adjusting antiemetics.  No plan for EGD noted.  The patient reports intermittent nausea but denies vomiting.  She is clinically stable today.  She had dialysis yesterday with around 1 L UF, tolerated well.  Plan for regular HD on 5/2. ?Discussed with patient's daughter as well. ? ?D. Dayron Odland ?CKA. ? ? ? ?  ? ?

## 2021-05-26 DIAGNOSIS — I132 Hypertensive heart and chronic kidney disease with heart failure and with stage 5 chronic kidney disease, or end stage renal disease: Secondary | ICD-10-CM | POA: Diagnosis not present

## 2021-05-26 DIAGNOSIS — N186 End stage renal disease: Secondary | ICD-10-CM | POA: Diagnosis not present

## 2021-05-26 DIAGNOSIS — R112 Nausea with vomiting, unspecified: Secondary | ICD-10-CM | POA: Diagnosis not present

## 2021-05-26 DIAGNOSIS — E118 Type 2 diabetes mellitus with unspecified complications: Secondary | ICD-10-CM | POA: Diagnosis not present

## 2021-05-26 DIAGNOSIS — E1143 Type 2 diabetes mellitus with diabetic autonomic (poly)neuropathy: Secondary | ICD-10-CM | POA: Diagnosis not present

## 2021-05-26 LAB — RENAL FUNCTION PANEL
Albumin: 2.3 g/dL — ABNORMAL LOW (ref 3.5–5.0)
Anion gap: 9 (ref 5–15)
BUN: 19 mg/dL (ref 8–23)
CO2: 26 mmol/L (ref 22–32)
Calcium: 8.5 mg/dL — ABNORMAL LOW (ref 8.9–10.3)
Chloride: 98 mmol/L (ref 98–111)
Creatinine, Ser: 5.34 mg/dL — ABNORMAL HIGH (ref 0.44–1.00)
GFR, Estimated: 8 mL/min — ABNORMAL LOW (ref >60)
Glucose, Bld: 100 mg/dL — ABNORMAL HIGH (ref 70–99)
Phosphorus: 5.7 mg/dL — ABNORMAL HIGH (ref 2.5–4.6)
Potassium: 3.9 mmol/L (ref 3.5–5.1)
Sodium: 133 mmol/L — ABNORMAL LOW (ref 135–145)

## 2021-05-26 IMAGING — CR PORTABLE CHEST - 1 VIEW
1 series · 1 of 1 positions shown · non-contrast
Comparison: Portable exam 7234 hours compared to 03/16/2017

CLINICAL DATA: Shortness of breath and mid chest pain for 3 weeks,
weight gain, to start dialysis soon, history CHF, type II diabetes
mellitus, hypertension, cancer of the RIGHT renal pelvis

EXAM:
PORTABLE CHEST 1 VIEW

[portable]
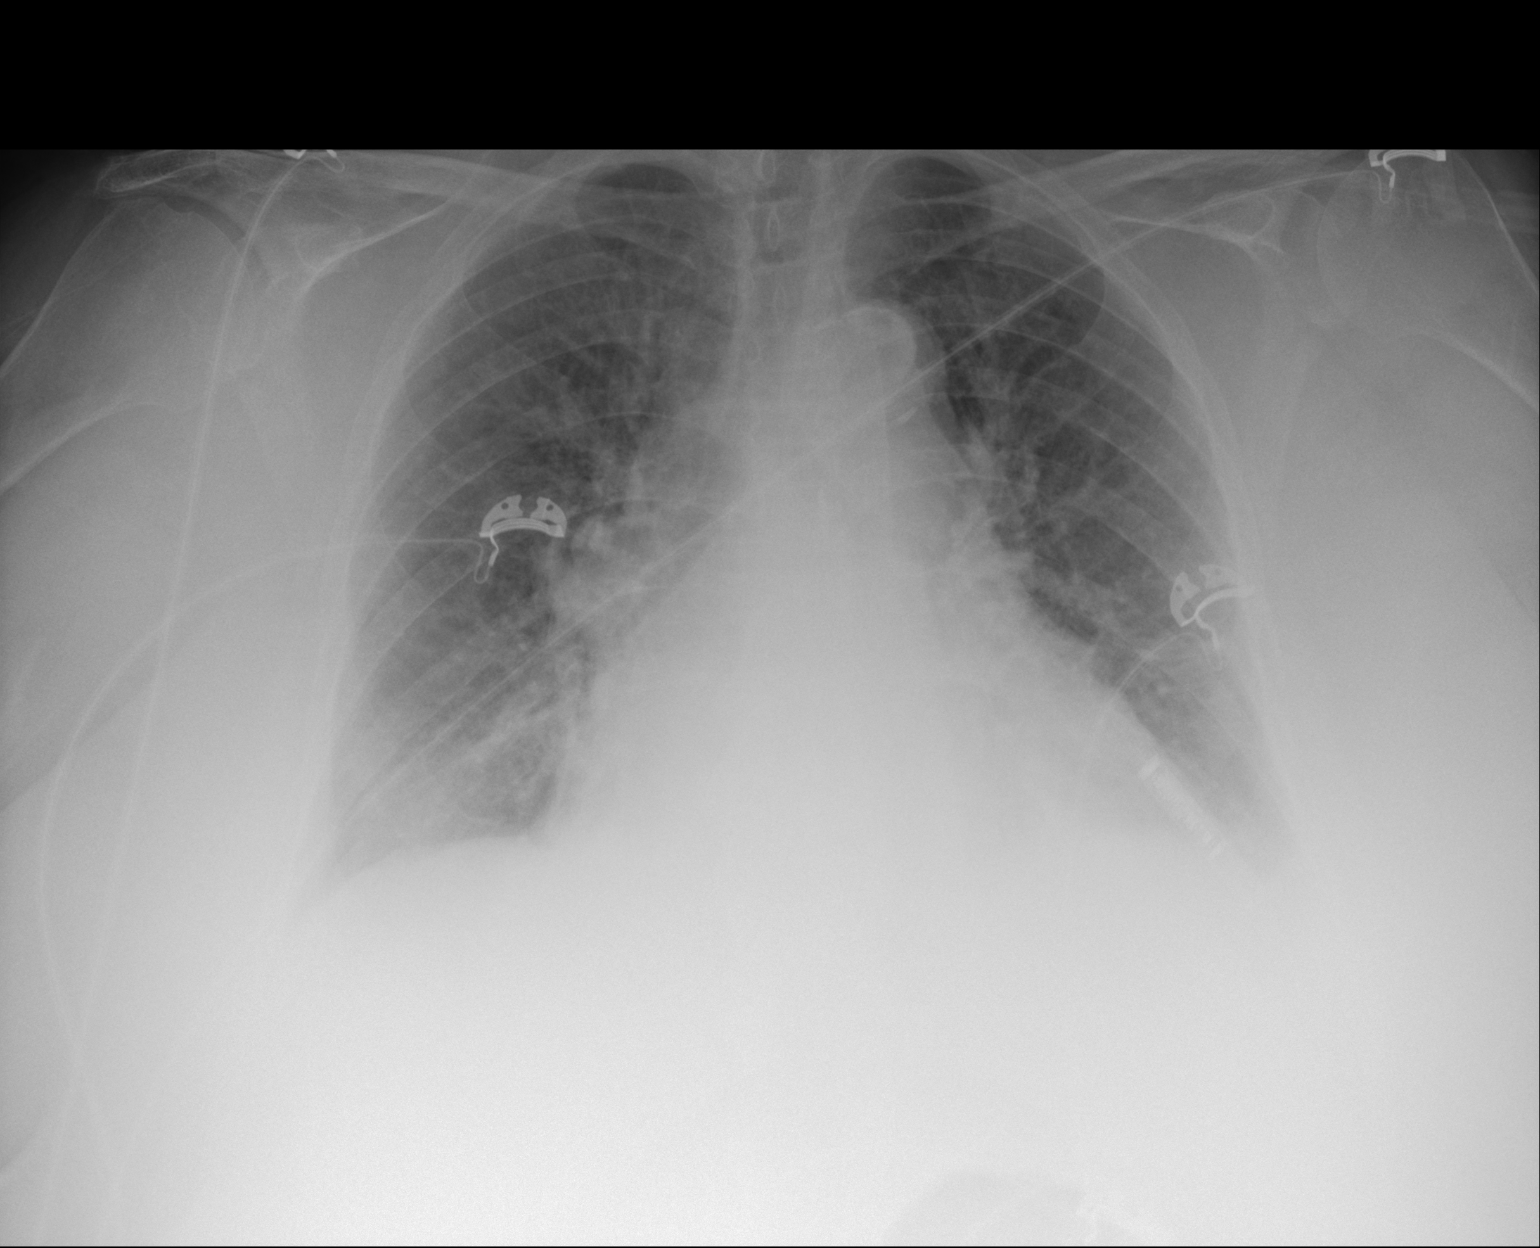

[1 of 1 positions shown; findings below may reference images not displayed]

FINDINGS: Enlargement of cardiac silhouette with pulmonary vascular
congestion.

Atherosclerotic calcification aorta.

Loop recorder projects over lower LEFT chest.

Hazy pulmonary markings may reflect minimal pulmonary edema.

No segmental consolidation, pleural effusion or pneumothorax.

Bones unremarkable.
IMPRESSION: Enlargement of cardiac silhouette with pulmonary vascular congestion
and suspect minimal pulmonary edema.

## 2021-05-26 MED ORDER — METOCLOPRAMIDE HCL 5 MG PO TABS: 5.0000 mg | ORAL_TABLET | Freq: Three times a day (TID) | ORAL | 0 refills | Status: DC

## 2021-05-26 NOTE — Plan of Care (Signed)
  Problem: Nutrition: Goal: Adequate nutrition will be maintained Outcome: Progressing   Problem: Pain Managment: Goal: General experience of comfort will improve Outcome: Progressing   Problem: Safety: Goal: Ability to remain free from injury will improve Outcome: Progressing   

## 2021-05-26 NOTE — Discharge Instructions (Addendum)
Dear [name],  ? ?Thank you so much for allowing [place] to be part of your care!  You were admitted to [facility] for missing dialysis and not being able to eat anything. ? ? ?POST-HOSPITAL & CARE INSTRUCTIONS ?Please continue to do wound care for abdomen and heel and follow this up at PCP ?Continue to go to HD sessions ?Follow a gastroparesis diet. Avoid foods that trigger your pain an nausea.  ?Please let PCP/Specialists know of any changes that were made.  ?Please see medications section of this packet for any medication changes.  ? ?DOCTOR'S APPOINTMENT & FOLLOW UP CARE INSTRUCTIONS  ?Future Appointments  ?Date Time Provider [place]  ?06/18/2021  2:15 PM [name], MD [facility] [place]  ?07/23/2021  2:00 PM [name], Peter M, MD [facility]  ? ? ?RETURN PRECAUTIONS: ? ? ?Take care and be well! ? ?Family Medicine Teaching Service  ?[place]  ?[facility]  ?[address] [place], [place] [address] ?[phone]  ?

## 2021-05-26 NOTE — Progress Notes (Signed)
Changed foam/xeroform dressing on LLQ area ?

## 2021-05-26 NOTE — Discharge Summary (Signed)
Family Medicine Teaching Service ?Hospital Discharge Summary ? ?Patient name: [name] Medical record number: [id number] ?Date of birth: 07/25/1940 Age: 81 y.o. Gender: female ?Date of Admission: 05/22/2021  Date of Discharge: 05/26/2021 ?Admitting Physician: Lind Covert, MD ? ?Primary Care Provider: [name], MD ?Consultants: GI, Nephrology ? ?Indication for Hospitalization: poor PO intake and volume overload s/p missed hemodialysis ? ?Discharge Diagnoses/Problem List:  ?Principal Problem: ?  ESRD (end stage renal disease) ([place]) ?Active Problems: ?  Controlled diabetes mellitus type 2 with complications ([place]) ?  Diabetic gastroparesis ([place]) ?  ?Disposition: Home ? ?Discharge Condition: Stable ? ?Discharge Exam:  ?General: NAD, laying in bed comfortably, alert and responsive to all questions ?Cardiovascular: RRR no murmurs rubs or gallops ?Respiratory: Clear to auscultation bilaterally no wheezes rales or crackles no increased work of breathing ?Abdomen: Nontender except on the lower abdominal fold where bandages.  Ostomy site without large drainage present.  ?Extremities: Left heel with pressure dressing in place, no lower extremity edema ? ? 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 Her hospital course is detailed below: ? ?Volume Overload  Missed Hemodyalisis  ESRD on HD ?On admission, patient had gone almost a week without hemodialysis.  Chest x-ray showed interstitial edema in the setting of missed HD.  She received HD the day of admission and continued to receive HD on normal schedule with nephrology following. ? 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  Given recurrence of symptoms, GI was consulted while inpatient.  They recommended to continue with Compazine, Reglan and Protonix for medication management and did not recommend an EGD at this time.  Patient was able to tolerate advancing diet prior to discharge and had improvement of nausea and vomiting.  She may benefit from follow-up with GI outpatient. ? ?Other chronic conditions were medically managed with home medications and formulary alternatives as necessary (Hypotension, pAFib, T2DM, hypothyroidism, anxiety, GERD) ? ?Issues for Follow Up:  ?Ensure going to HD ?Patient switched from Protonix to Pepcid due to side effects, d/c'd compazine due to side effects  ?Qtc 452, has known history of QT prolongation  ?Discontinued estradiol for HRT ? ?Significant Procedures: None ? ?Significant Labs and Imaging:  ?Recent Labs  ?Lab 05/24/21 ?0041 05/24/21 ?1241 05/25/21 ?0009  ?WBC 9.8 10.2 9.8  ?HGB 9.1* 9.5* 9.4*  ?HCT 30.7* 31.8* 32.3*  ?PLT 358 311 326  ? ?Recent Labs  ?Lab 05/22/21 ?1334 05/22/21 ?1710 05/22/21 ?2020 05/23/21 ?0209 05/24/21 ?0041 05/24/21 ?1241 05/26/21 ?0210  ?NA 137   < > 136 136 134* 135 133*  ?K 5.8*   < > 5.1 4.1 5.2* 4.0 3.9  ?CL 102  --  101 102 99 99 98  ?CO2 18*  --  21* '24 26 27 26  '$ ?GLUCOSE 214*  --  144* 128* 143* 121* 100*  ?BUN 33*  --  33* 12 18 <5* 19  ?CREATININE 9.83*  --  9.92* 4.86* 6.07* 2.41* 5.34*  ?CALCIUM 10.0  --  9.6 8.2* 8.9 7.9* 8.5*  ?PHOS  --   --  6.2* 3.4 6.3* 2.8 5.7*  ?ALKPHOS 103  --   --   --   --   --   --   ?AST 35  --   --   --   --   --   --   ?ALT 18  --   --   --   --   --   --   ?ALBUMIN 3.0*  --  2.6* 2.5* 2.5* 2.5* 2.3*  ? < > = values in this interval not displayed.  ? ? ?Results/Tests Pending at Time of Discharge:  ? ?Discharge  Medications:  ?Allergies as of 05/26/2021   ? ?   Reactions  ? Adhesive [tape] Itching, Swelling, Rash, Other (See Comments)  ? Tears skin and causes blisters also. EKG pads will cause welts  ? Avelox [moxifloxacin] Swelling, Rash  ? Banana Anaphylaxis, Other (See Comments)  ? Blisters appear also  ? Blueberry Flavor Anaphylaxis  ? Cantaloupe Extract Allergy Skin Test Anaphylaxis, Other (See Comments)  ? Blisters appear also  ? Cefprozil Shortness Of Breath, Rash, Other (See Comments)  ? Tolerated ceftriaxone on 06/20/20  ? Cetacaine [butamben-tetracaine-benzocaine] Nausea And Vomiting, Swelling  ? Dicyclomine Nausea And Vomiting, Other (See Comments)  ? "Heart trouble"; Headaches and increased blood sugars  ? Food Anaphylaxis, Other (See Comments)  ? Melons- throat closes and blisters appear  ? Imdur [isosorbide Nitrate] Hives, Palpitations, Other (See Comments), Rash  ? Headaches also  ? Januvia [sitagliptin] Shortness Of Breath  ? Lipitor [atorvastatin] Shortness Of Breath  ? Losartan Potassium Shortness Of Breath  ? Nitroglycerin Other (See Comments)  ? Caused cardiac arrest and feels like skin bring torn off back of head  ? Omeprazole Shortness Of Breath, Swelling  ? Oxycodone Hives, Rash, Other (See Comments)  ? Tolerates Dilaudid  ? Penicillins Anaphylaxis  ? Has patient had a PCN reaction causing immediate rash, facial/tongue/throat swelling, SOB or lightheadedness with hypotension: Yes ?Has patient had a PCN reaction causing severe rash involving mucus membranes or skin necrosis: No ?Has patient had a PCN reaction that required hospitalization Yes ?Has patient had a PCN reaction occurring within the last 10 years: No  ? Prednisone Anaphylaxis  ? Vancomycin Anaphylaxis  ? Watermelon [citrullus Vulgaris] Anaphylaxis, Other (See Comments)  ? Blisters appear also  ? Hydrocodone Hives, Other (See Comments)  ? Tolerates Dilaudid  ? Latex Rash, Other (See Comments)  ? Blisters  ? Tamiflu [oseltamivir] Other (See  Comments)  ? Contraindicated with other medications ?Patient on tikosyn, and tamiflu interfered with anti arrhythmic med ?Contraindicated with other medications ?Patient on tikosyn, and tamiflu interfered with anti arrhythmic med  ? Feraheme [ferumoxytol] Other (See Comments)  ? Sharp pain to lower back and flank  ? Other Other (See Comments)  ? Hydrogen - unknown- patient does not recall this (??)  ? Lasix [furosemide] Hives, Swelling, Rash  ? Mupirocin Rash  ? ?  ? ?  ?Medication List  ?  ? ?STOP taking these medications   ? ?(feeding supplement) PROSource Plus liquid ?  ?calcitRIOL 0.5 MCG capsule ?Commonly known as: ROCALTROL ?  ?estradiol 1 MG tablet ?Commonly known as: ESTRACE ?  ?sevelamer 800 MG tablet ?Commonly known as: RENAGEL ?  ? ?  ? ?TAKE these medications   ? ?acetaminophen 500 MG tablet ?Commonly known as: TYLENOL ?Take 0.5 tablets (250 mg total) by mouth every 6 (six) hours as needed  for mild pain (pain). ?What changed:  ?how much to take ?when to take this ?  ?albuterol 108 (90 Base) MCG/ACT inhaler ?Commonly known as: VENTOLIN HFA ?Inhale 1-2 puffs into the lungs every 6 (six) hours as needed for wheezing or shortness of breath. ?  ?amiodarone 200 MG tablet ?Commonly known as: PACERONE ?TAKE 1 TABLET EVERY DAY ?  ?apixaban 2.5 MG Tabs tablet ?Commonly known as: ELIQUIS ?Take 1 tablet (2.5 mg total) by mouth 2 (two) times daily. ?  ?cetirizine 10 MG tablet ?Commonly known as: ZYRTEC ?Take 10 mg by mouth daily. ?  ?clonazePAM 1 MG tablet ?Commonly known as: KLONOPIN ?Take 0.5-1 mg by mouth See admin instructions. Take 1 mg by mouth at bedtime on Sun/Mon/Wed/Fri and 0.5 mg at bedtime on Tues/Thurs/Sat- may take an additional 0.5-1 mg up to two (more) times a day as needed for anxiety (total combined daily dose is a max of 3 milligrams) ?  ?famotidine 20 MG tablet ?Commonly known as: PEPCID ?Take 20 mg by mouth in the morning. ?  ?fluticasone 50 MCG/ACT nasal spray ?Commonly known as: FLONASE ?Place  1 spray into both nostrils daily. ?  ?levalbuterol 1.25 MG/3ML nebulizer solution ?Commonly known as: XOPENEX ?Take 1.25 mg by nebulization 2 (two) times daily as needed for wheezing or shortness of breath. ?

## 2021-05-26 NOTE — Progress Notes (Signed)
? [facility] ?Progress Note  ? ?Subjective: Seen in room. Might be going home today.  ? ?Objective ?Vitals:  ? 05/25/21 2036 05/26/21 0500 05/26/21 0745 05/26/21 0900  ?BP: (!) 102/54 (!) (P) 113/56 (!) 104/58   ?Pulse: 67 (P) 61 (!) 58 68  ?Resp: 16 (P) 18 16   ?Temp: 98.4 ?F (36.9 ?C) (!) (P) 97.3 ?F (36.3 ?C) 98.1 ?F (36.7 ?C)   ?TempSrc: Oral (P) Oral Oral   ?SpO2: 100% (P) 96% 97%   ?Weight:      ?Height:      ? ?Physical Exam ?General: Chronically ill appearing elderly female in NAD ?Heart: S1,S2 RRR No M/R/G ?Lungs: CTAB A/P ?Abdomen: colostomy present, green drainage. NABS,  ?Extremities: No LE edema.  ?Dialysis Access: [facility]  D[facility]I.  ? ? ?Additional Objective ?Labs: ?Basic Metabolic Panel: ?Recent Labs  ?Lab 05/24/21 ?0041 05/24/21 ?1241 05/26/21 ?0210  ?NA 134* 135 133*  ?K 5.2* 4.0 3.9  ?CL 99 99 98  ?CO2 '26 27 26  '$ ?GLUCOSE 143* 121* 100*  ?BUN 18 <5* 19  ?CREATININE 6.07* 2.41* 5.34*  ?CALCIUM 8.9 7.9* 8.5*  ?PHOS 6.3* 2.8 5.7*  ? ? ?Liver Function Tests: ?Recent Labs  ?Lab 05/22/21 ?1334 05/22/21 ?2020 05/24/21 ?0041 05/24/21 ?1241 05/26/21 ?0210  ?AST 35  --   --   --   --   ?ALT 18  --   --   --   --   ?ALKPHOS 103  --   --   --   --   ?BILITOT 0.6  --   --   --   --   ?PROT 6.8  --   --   --   --   ?ALBUMIN 3.0*   < > 2.5* 2.5* 2.3*  ? < > = values in this interval not displayed.  ? ? ?Recent Labs  ?Lab 05/22/21 ?1334  ?LIPASE 49  ? ? ?CBC: ?Recent Labs  ?Lab 05/22/21 ?2020 05/23/21 ?0209 05/24/21 ?0041 05/24/21 ?1241 05/25/21 ?0009  ?WBC 11.1* 13.6* 9.8 10.2 9.8  ?HGB 9.2* 9.2* 9.1* 9.5* 9.4*  ?HCT 32.8* 32.0* 30.7* 31.8* 32.3*  ?MCV 82.8 80.4 80.8 80.3 80.1  ?PLT 439* 321 358 311 326  ? ? ?Blood Culture ?   ?Component Value Date/Time  ? SDES BLOOD RIGHT WRIST 03/19/2021 1642  ? SPECREQUEST  03/19/2021 1642  ?  BOTTLES DRAWN AEROBIC AND ANAEROBIC Blood Culture adequate volume  ? CULT  03/19/2021 1642  ?  NO GROWTH 5 DAYS ?Performed at [facility], [place] [address]  [place], [place] [address] ?  ? REPTSTATUS 03/24/2021 FINAL 03/19/2021 1642  ? ? ?Cardiac Enzymes: ?No results for input(s): CKTOTAL, CKMB, CKMBINDEX, TROPONINI in the last 168 hours. ?CBG: ?Recent Labs  ?Lab 05/22/21 ?1336  ?GLUCAP 216*  ? ? ?Iron Studies: No results for input(s): IRON, TIBC, TRANSFERRIN, FERRITIN in the last 72 hours. ?'@lablastinr3'$ @ ?Studies/Results: ?No results found. ?Medications: ? sodium chloride    ? sodium chloride    ? sodium chloride    ? sodium chloride    ? ? amiodarone  200 mg Oral Daily  ? apixaban  2.5 mg Oral BID  ? Chlorhexidine Gluconate Cloth  6 each Topical Q0600  ? famotidine  20 mg Oral Daily  ? fluticasone  1 spray Each Nare Daily  ? heparin  2,700 Units Dialysis Once in dialysis  ? levothyroxine  175 mcg Oral QAC breakfast  ? metoCLOPramide  5 mg Oral TID  AC & HS  ? midodrine  10 mg Oral TID  ? multivitamin  1 tablet Oral QHS  ? vitamin B-12  500 mcg Oral Daily  ? ? ? ?Dialysis Orders: Center: [place]  on TTS . ?EDW 89.5kg HD Bath 2K/2Ca  Time 3:30 Heparin 2700 units IVP. Access TDC BFR 400  DFR 600    ?Micera 225 mcg IVP every 2 weeks, calcitriol 0.5 mcg po/HD ?  ?Assessment/Plan: ? Intractable nausea and vomiting - Primary W/U for possible gastroparesis. Interestingly BUN only 33 on admission after missing a week of HD so we doubted uremia. Being seen by GI. Nausea improved w/ antiemetics. Please renal dose Reglan if ordered. Per primary.  ? ESRD -  on HD TTS.  For dc today. Will get OP HD tomorrow on 5/02.  ? Hypertension/volume  - BP and volume seem stable. HD 05/24/2021 Net UF just over 1 L. Under OP EDW if weights are accurate. Lower EDW on DC.  ? Anemia  - stable ? Metabolic bone disease -   continue with home meds ? Nutrition - advance as tolerated ?H/O DMT2 not on medications ?H/O PAF on Eliquis.  ?Hypothyroidism-per primary ?H/O R hemicolectomy with colostomy ?  ? ?Kelly Splinter, MD ?05/26/2021, 2:37 PM ? ? ? ? ? ? ?  ? ?

## 2021-05-26 NOTE — Progress Notes (Signed)
D/C order noted. Contacted [facility] to make clinic aware pt will d/c today and resume care tomorrow.  ? ?Melven Sartorius ?Renal Navigator ?[phone] ?

## 2021-05-26 NOTE — Plan of Care (Signed)
  Problem: Education: Goal: Knowledge of General Education information will improve Description: Including pain rating scale, medication(s)/side effects and non-pharmacologic comfort measures Outcome: Progressing   Problem: Clinical Measurements: Goal: Ability to maintain clinical measurements within normal limits will improve Outcome: Progressing Goal: Will remain free from infection Outcome: Progressing   

## 2021-05-26 NOTE — Progress Notes (Signed)
Family Medicine Teaching Service ?Daily Progress Note ?Intern Pager: [phone] ? ?Patient name: [name] Medical record number: [id number] ?Date of birth: [birth year] Age: 81 y.o. Gender: female ? ?Primary Care Provider: [name], MD ?Consultants: GI, Nephrology ?Code Status: Full ? ?Pt Overview and Major Events to Date:  ?4/27 admitted ? ?Assessment and Plan: ? ?[name] is a 81 year old female presenting with poor PO intake and volume overload in the setting of missed HD. PMH significant for ESRD on HD, Lynch syndrome with hx of colon cancer s/p R hemicolectomy with colostomy in place, hypothyroidism, asthma, GERD, T2DM, pAfib on eliquis, CAD, HLD ? ?Poor PO intake with Recurrent N/V ?PO intake has been improved, ate to sponge cakes yesterday without issue. Has ordered breakfast and will see how she does with this.  Most likely 2/2 to gastroparesis. ?-Renal diet with fluid restriction ?-metoclopramide PO ?-Compazine 10 mg q6h prn ?-famotidine 20 mg qd  ?-Zofran ? ?Wounds  ?Abdominal folds with bandages on.  Yesterday was draining foul odor substance and was red.  Still tender to palpation on examination.  Left heel with stage 2 pressure injury and foam protector on this foot present.  Daughter noted some drainage from ostomy site and needed changing.  Has remained afebrile ?-Discussed with RN to change ostomy ? ?ESRD on HD ?T, Th, S. Missed a week of HD has been receiving here. Due for next HD session tomorrow. ?-Nephrology following, appreciate recs and care ?-HD per nephro ?-RenaVit and Vit B12 ? ?Lynch Syndrome s/p R hemicolectomy ?-WOC for colostomy care ?-GI Outpatient f/u ? ?Hypotension ?BP ranges have been 102-144/54-67. ?-midodrine 10 mg TID ? ?Anemia of CKD ?Hgb stable 9.4 yesterday. ? ?pAF on Elqiuis  HFpEF ?-home eliquis ?-home amiodarone ? ?FEN/GI: Renal Diet with fluid restriction ?PPx: Eliquis ?Dispo: Home today pending tolerating diet ? ?Subjective:  ?Starting to eat a little bit  better was able to eat to sponge cakes yesterday and is getting ready to eat breakfast currently. ? ?Objective: ?Temp:  [97.3 ?F (36.3 ?C)-98.4 ?F (36.9 ?C)] (P) 97.3 ?F (36.3 ?C) (05/01 0500) ?Pulse Rate:  [61-67] (P) 61 (05/01 0500) ?Resp:  [16-18] (P) 18 (05/01 0500) ?BP: (102-144)/(54-67) (P) 113/56 (05/01 0500) ?SpO2:  [96 %-100 %] (P) 96 % (05/01 0500) ?Physical Exam: ?General: NAD, laying in bed comfortably, alert and responsive to all questions ?Cardiovascular: RRR no murmurs rubs or gallops ?Respiratory: Clear to auscultation bilaterally no wheezes rales or crackles no increased work of breathing ?Abdomen: Nontender except on the lower abdominal fold where bandages.  Ostomy site without large drainage present.  ?Extremities: Left heel with pressure dressing in place, no lower extremity edema ? ?Laboratory: ?Recent Labs  ?Lab 05/24/21 ?0041 05/24/21 ?1241 05/25/21 ?0009  ?WBC 9.8 10.2 9.8  ?HGB 9.1* 9.5* 9.4*  ?HCT 30.7* 31.8* 32.3*  ?PLT 358 311 326  ? ?Recent Labs  ?Lab 05/22/21 ?1334 05/22/21 ?1710 05/24/21 ?0041 05/24/21 ?1241 05/26/21 ?0210  ?NA 137   < > 134* 135 133*  ?K 5.8*   < > 5.2* 4.0 3.9  ?CL 102   < > 99 99 98  ?CO2 18*   < > '26 27 26  '$ ?BUN 33*   < > 18 <5* 19  ?CREATININE 9.83*   < > 6.07* 2.41* 5.34*  ?CALCIUM 10.0   < > 8.9 7.9* 8.5*  ?PROT 6.8  --   --   --   --   ?BILITOT 0.6  --   --   --   --   ?  ALKPHOS 103  --   --   --   --   ?ALT 18  --   --   --   --   ?AST 35  --   --   --   --   ?GLUCOSE 214*   < > 143* 121* 100*  ? < > = values in this interval not displayed.  ? ? ?Imaging/Diagnostic Tests: ? ? ?Allison Heck, MD ?05/26/2021, 6:04 AM ?PGY-1, [facility] ?[place] Intern pager: [phone], text pages welcome  ?

## 2021-05-27 ENCOUNTER — Telehealth: Payer: Self-pay | Admitting: Nephrology

## 2021-05-27 NOTE — Telephone Encounter (Signed)
Transition of care contact from inpatient facility ? ?Date of Discharge: 05/26/21 ?Date of Contact: 05/27/21 ?Method of contact: Phone-- attempt  ? ?Attempted to contact patient to discuss transition of care from inpatient admission. Patient did not answer the phone. Message was left on the patient's voicemail. Will try to reach at outpatient dialysis.  ?

## 2021-05-28 ENCOUNTER — Encounter: Payer: Self-pay | Admitting: Student

## 2021-05-28 ENCOUNTER — Other Ambulatory Visit: Payer: Self-pay | Admitting: Student

## 2021-05-28 ENCOUNTER — Telehealth: Payer: Self-pay

## 2021-05-28 DIAGNOSIS — E1143 Type 2 diabetes mellitus with diabetic autonomic (poly)neuropathy: Secondary | ICD-10-CM

## 2021-05-28 MED ORDER — METOCLOPRAMIDE HCL 5 MG PO TABS: 5.0000 mg | ORAL_TABLET | Freq: Three times a day (TID) | ORAL | 0 refills | Status: DC

## 2021-05-28 NOTE — Progress Notes (Signed)
Refill

## 2021-05-28 NOTE — Telephone Encounter (Signed)
Daughter calls nurse line in regards to metoclopramide prescription.  ? ?Daughter reports they had to move her hospital FU to 5/15 from 5/5. Daughter reports they were supposed to discuss continuing medication at 5/5 visit. Daughter reports the medication is working "great" and she hopes this can be refilled when the script runs out on 5/10. ? ?Will forward to PCP.  ?

## 2021-05-29 NOTE — Telephone Encounter (Signed)
Patients daughter has been informed of medication refill.  ?

## 2021-05-30 ENCOUNTER — Ambulatory Visit: Payer: Medicare HMO | Admitting: Student

## 2021-06-03 ENCOUNTER — Telehealth: Payer: Self-pay | Admitting: *Deleted

## 2021-06-03 NOTE — Telephone Encounter (Signed)
Received a call from [facility] [phone]) with [facility].  She states that patient is scheduled next week for her hospital follow up but is wondering if PCP can go ahead and place orders for home health to evaluate for PT, OT and skilled nursing.  Patient has limitations due to her current wounds and ostomy site.  She reports that patient has a hard time getting her to her hemodialysis appointments as well.  Will forward to MD to place this home health referral.  Johnney Ou ? ?

## 2021-06-04 ENCOUNTER — Other Ambulatory Visit: Payer: Self-pay | Admitting: Student

## 2021-06-04 DIAGNOSIS — N186 End stage renal disease: Secondary | ICD-10-CM

## 2021-06-04 DIAGNOSIS — E1143 Type 2 diabetes mellitus with diabetic autonomic (poly)neuropathy: Secondary | ICD-10-CM

## 2021-06-04 DIAGNOSIS — K9409 Other complications of colostomy: Secondary | ICD-10-CM

## 2021-06-04 NOTE — Progress Notes (Signed)
Home health order for PT, OT and skilled nursing. Patient will benefit from PT/OT giving generalized fatigue and difficulty with ambulation. She also need help with wound care at sit of colostomy bag.  ?

## 2021-06-09 ENCOUNTER — Encounter: Payer: Self-pay | Admitting: Student

## 2021-06-09 ENCOUNTER — Ambulatory Visit (INDEPENDENT_AMBULATORY_CARE_PROVIDER_SITE_OTHER): Payer: Medicare HMO | Admitting: Student

## 2021-06-09 ENCOUNTER — Other Ambulatory Visit: Payer: Self-pay

## 2021-06-09 VITALS — BP 108/60 | HR 62 | Ht 62.0 in | Wt 199.8 lb

## 2021-06-09 DIAGNOSIS — R1902 Left upper quadrant abdominal swelling, mass and lump: Secondary | ICD-10-CM

## 2021-06-09 NOTE — Progress Notes (Signed)
? ? ?  SUBJECTIVE:  ? ?CHIEF COMPLAINT / HPI: Hospital follow up ? ?Patient was recently seen at the hospital for nausea and vomiting after two more dialysis treatment.  She reports her symptoms have much improved since discharge from hospital. ? ?Skin Ulcers ?Patient reports she is noticed two skin wounds on the Left lower quadrant of her abdomen that started couple of weeks ago.  Has tried Neosporin and powder supplements and wound dressing over the wound but have not had any improvement since then.  According to patient his skin also started as small bumps that gradually developed into ulcers. They are tender and haven't grown in size. ? ?Abdominal Knot ?Patient reports she started noticing a knot over her right upper quadrant of the abdomen for weeks since her colonoscopy at the GI office. She spoken to GI who suspect this could be a scar tissue. She endorses tenderness of the knot.  ? ?PERTINENT  PMH / PSH:, A-fib, DM, ESRD on HD. ? ?OBJECTIVE:  ? ?BP 108/60   Pulse 62   Ht '5\' 2"'$  (1.575 m)   Wt 199 lb 12.8 oz (90.6 kg)   SpO2 97%   BMI 36.54 kg/m?   ? ?Physical Exam ?General: Alert, well appearing, NAD ?Cardiovascular: RRR, No Murmurs, Normal S2/S2 ?Respiratory: Normal work of breathing on RA ?Abdomen: Distended, Colostomy bag placed on the right side of the Abdomen. Two healing, dry skin superficial ulcers on the LLQ of the abdomen and an firm tender Knot on the LUQ of the abdomen ? ? ? ?ASSESSMENT/PLAN:  ? ?Hospital follow-up ?She is overall stable and denies having any nausea or vomiting since discharge from hospitalization.  Advised patient to continue hemodialysis emphasized the importance of compliance with treatments. ? ?Skin ulcer ?Exam patient was noted to have two 2cm ulcers on the LLQ of the abdomen that are consistent with pressure ulcer.  Recommend patient continue weekly dressings with colloid patch over the wound and advised to avoid use of powder on the wound.  ? ?LUQ mass ?On exam patient  has firm knot on the LUQ of the abdomen that is tender on palpation. This could be concerning for scar tissue. Other consideration include lipoma however will obtain Abdominal US to further assess the mass.  ? ? ?  ? ? ?[name], MD ?[place]  ? ? ?

## 2021-06-09 NOTE — Patient Instructions (Signed)
It was wonderful to see you today. Thank you for allowing me to be a part of your care. Below is a short summary of what we discussed at your visit today: ? ?Placed an order for abdominal ultrasound to assess the knot on your abdomen. ? ?Apply cool dressing over the affected area a week at a time. Avoid using powder on the affected area ? ? ?If you have any questions or concerns, please do not hesitate to contact us via phone or MyChart message.  ? ?[name], MD ?[facility]  ?

## 2021-06-09 NOTE — Telephone Encounter (Signed)
LM for [facility] updating her on home health options.  Unfortunately due to staffing in patient's area, [place], [facility], [facility], [facility], and [facility] have all declined the order.  Will wait to hear back from her on what may be another option.  ? ?Will also update the provider so this can be discussed at the upcoming appointment with the patient. Sheniqua Carolan,CMA ? ?

## 2021-06-10 ENCOUNTER — Encounter: Payer: Self-pay | Admitting: Family Medicine

## 2021-06-10 DIAGNOSIS — K439 Ventral hernia without obstruction or gangrene: Secondary | ICD-10-CM

## 2021-06-10 HISTORY — DX: Ventral hernia without obstruction or gangrene: K43.9

## 2021-06-18 ENCOUNTER — Ambulatory Visit: Payer: Medicare HMO | Admitting: Student

## 2021-06-18 ENCOUNTER — Ambulatory Visit (INDEPENDENT_AMBULATORY_CARE_PROVIDER_SITE_OTHER): Payer: Medicare HMO | Admitting: Student

## 2021-06-18 ENCOUNTER — Encounter: Payer: Self-pay | Admitting: Student

## 2021-06-18 VITALS — BP 116/47 | HR 66 | Wt 195.4 lb

## 2021-06-18 DIAGNOSIS — L98431 Non-pressure chronic ulcer of abdomen limited to breakdown of skin: Secondary | ICD-10-CM

## 2021-06-18 DIAGNOSIS — L98499 Non-pressure chronic ulcer of skin of other sites with unspecified severity: Secondary | ICD-10-CM | POA: Insufficient documentation

## 2021-06-18 DIAGNOSIS — L03319 Cellulitis of trunk, unspecified: Secondary | ICD-10-CM | POA: Diagnosis not present

## 2021-06-18 DIAGNOSIS — L98491 Non-pressure chronic ulcer of skin of other sites limited to breakdown of skin: Secondary | ICD-10-CM

## 2021-06-18 MED ORDER — METRONIDAZOLE 500 MG PO TABS: 500.0000 mg | ORAL_TABLET | Freq: Three times a day (TID) | ORAL | 0 refills | Status: DC

## 2021-06-18 MED ORDER — MORPHINE SULFATE 15 MG PO TABS: 15.0000 mg | ORAL_TABLET | ORAL | 0 refills | Status: DC | PRN

## 2021-06-18 MED ORDER — DOXYCYCLINE HYCLATE 100 MG PO TABS: 100.0000 mg | ORAL_TABLET | Freq: Two times a day (BID) | ORAL | 0 refills | Status: DC

## 2021-06-18 NOTE — Progress Notes (Unsigned)
  SUBJECTIVE:   CHIEF COMPLAINT / HPI:   Skin ulcer: At prior visit on 5/15 noted to have two 2 cm ulcers on the left lower quadrant of the abdomen consistent with pressure ulcers. She has the same colloid patch over the wounds for the last 9 days. States her pain has remained the same and she has had bleeding for the last 8 days.   PERTINENT  PMH / PSH: A-fib, ESRD on HD, CAD, HTN, T2DM, hypothyroidism, anemia of renal disease  OBJECTIVE:  BP (!) 116/47   Pulse 66   Wt 195 lb 6.4 oz (88.6 kg)   SpO2 100%   BMI 35.74 kg/m   General: NAD, pleasant, able to participate in exam Cardiac: RRR, no murmurs auscultated. Respiratory: CTAB, normal effort, no wheezes, rales or rhonchi Skin: warm and dry, no rashes noted  ASSESSMENT/PLAN:  No problem-specific Assessment & Plan notes found for this encounter.   Orders Placed This Encounter  Procedures   Ambulatory referral to [place]    Referral Priority:   Routine    Referral Type:   Home Health Care    Referral Reason:   Specialty Services Required    Requested Specialty:   [place]    Number of Visits Requested:   1   Meds ordered this encounter  Medications   doxycycline (VIBRA-TABS) 100 MG tablet    Sig: Take 1 tablet (100 mg total) by mouth 2 (two) times daily for 10 days.    Dispense:  20 tablet    Refill:  0   metroNIDAZOLE (FLAGYL) 500 MG tablet    Sig: Take 1 tablet (500 mg total) by mouth 3 (three) times daily for 10 days.    Dispense:  30 tablet    Refill:  0   morphine (MSIR) 15 MG tablet    Sig: Take 1 tablet (15 mg total) by mouth every 4 (four) hours as needed for severe pain.    Dispense:  30 tablet    Refill:  0   Return in about 6 days (around 06/24/2021) for Skin ulcer follow-up. Wells Guiles, DO 06/18/2021, 5:31 PM PGY-***, [facility] {    This will disappear when note is signed, click to select method of visit    :1}

## 2021-06-18 NOTE — Patient Instructions (Signed)
It was great to see you today! Thank you for choosing [facility] for your primary care. [name] was seen for skin ulcer follow-up.  Today we addressed: I have prescribed 2 antibiotics.  I have also prescribed pain medication to take every 4 hours as needed.  Please take these antibiotics as soon as you get them.  I will also submit referrals to home health and wound care.  I would like you to follow-up early next week.  If you begin to develop a fever or redness around the ulcer sites that continues to spread, please call our emergency line or proceed to the emergency department.  Please take this after visit summary to your dialysis center and asked them to get a CBC with differential and send results to our office.  If you haven't already, sign up for My Chart to have easy access to your labs results, and communication with your primary care physician.  We are checking some labs today. If they are abnormal, I will call you. If they are normal, I will send you a MyChart message (if it is active) or a letter in the mail. If you do not hear about your labs in the next 2 weeks, please call the office.   You should return to our clinic Return in about 6 days (around 06/24/2021) for Skin ulcer follow-up.  I recommend that you always bring your medications to each appointment as this makes it easy to ensure you are on the correct medications and helps [place] not miss refills when you need them.  Please arrive 15 minutes before your appointment to ensure smooth check in process.  We appreciate your efforts in making this happen.  Please call the clinic at [phone] if your symptoms worsen or you have any concerns.  Thank you for allowing me to participate in your care, Wells Guiles, DO 06/18/2021, 5:16 PM PGY-1, [place]

## 2021-06-19 DIAGNOSIS — L03319 Cellulitis of trunk, unspecified: Secondary | ICD-10-CM | POA: Insufficient documentation

## 2021-06-19 NOTE — Assessment & Plan Note (Addendum)
Patient returns today with worsening ulcers on left abdomen which began bleeding that shortly prior visit.  Induration of the abdominal tissue and significant tenderness makes me concerned for cellulitis although patient has not been experiencing fever.  Pain control continues to be difficult given medical history and additionally antibiotic management particular with prior anaphylaxis to medications in the past.  Doxycycline 100 mg twice daily x10 days, Flagyl 500 mg 3 times daily x10 days, morphine IR every 4 hours as needed.  Labs) today but patient is in dialysis tomorrow.  Placed on AVS that I would like dialysis to obtain CBC with differential and send results to my office.  Discussed particularly with patient's family as well.  Close follow-up in 6 days and return/emergency precautions discussed. Patient has voiced that she wants to continue with dialysis and continue to have more time living and therefore is inappropriate for palliative/hospice care at this time.  Referred to community care coordination for home nurse aide and wound care assistance.

## 2021-06-20 ENCOUNTER — Telehealth: Payer: Self-pay | Admitting: *Deleted

## 2021-06-20 ENCOUNTER — Ambulatory Visit (HOSPITAL_COMMUNITY)
Admission: RE | Admit: 2021-06-20 | Discharge: 2021-06-20 | Disposition: A | Discharge status: Home / Self Care | Payer: Medicare HMO | Source: Ambulatory Visit | Attending: Family Medicine | Admitting: Family Medicine

## 2021-06-20 ENCOUNTER — Telehealth: Payer: Self-pay

## 2021-06-20 DIAGNOSIS — R1902 Left upper quadrant abdominal swelling, mass and lump: Secondary | ICD-10-CM | POA: Diagnosis present

## 2021-06-20 MED ORDER — HYDROMORPHONE HCL 2 MG PO TABS
1.0000 mg | ORAL_TABLET | ORAL | 0 refills | Status: DC | PRN
Start: 2021-06-20 — End: 2021-07-01

## 2021-06-20 NOTE — Telephone Encounter (Signed)
Patients daughter has been updated and was very Patent attorney.

## 2021-06-20 NOTE — Chronic Care Management (AMB) (Signed)
  Care Management   Note  06/20/2021 Name: [name] MRN: [id number] DOB: 11-10-1940  [name] is a 81 y.o. year old female who is a primary care patient of [name], MD. I reached out to [name] by phone today offer care coordination services.   [name] was given information about care management services today including:  Care management services include personalized support from designated clinical staff supervised by her physician, including individualized plan of care and coordination with other care providers 24/7 contact phone numbers for assistance for urgent and routine care needs. The patient may stop care management services at any time by phone call to the office staff.  Daughter [name] DPR on fie  verbally agreed to assistance and services provided by embedded care coordination/care management team today.  Follow up plan: Telephone appointment with care management team member scheduled for:06/24/21  [facility]  Direct Dial: [phone]

## 2021-06-20 NOTE — Telephone Encounter (Signed)
Patients daughter calls nurse line reporting adverse reaction to Morphine.   Daughter reports first dose was given yesterday and shortly after nausea and "whole" body itching occurred. Daughter reports symptoms carried on into the night.   Daughter reports she does feel better now, however they would like a different pain medication.   Will forward to provider who saw patient.

## 2021-06-21 ENCOUNTER — Other Ambulatory Visit: Payer: Self-pay | Admitting: Family Medicine

## 2021-06-21 DIAGNOSIS — I480 Paroxysmal atrial fibrillation: Secondary | ICD-10-CM

## 2021-06-23 ENCOUNTER — Emergency Department (HOSPITAL_COMMUNITY): Payer: Medicare HMO

## 2021-06-23 ENCOUNTER — Inpatient Hospital Stay (HOSPITAL_COMMUNITY)
Admission: EM | Admit: 2021-06-23 | Discharge: 2021-07-01 | DRG: 853 | Disposition: A | Discharge status: Home Health | Payer: Medicare HMO | Attending: Family Medicine | Admitting: Family Medicine

## 2021-06-23 ENCOUNTER — Other Ambulatory Visit: Payer: Self-pay

## 2021-06-23 ENCOUNTER — Encounter (HOSPITAL_COMMUNITY): Payer: Self-pay | Admitting: Emergency Medicine

## 2021-06-23 DIAGNOSIS — Z79890 Hormone replacement therapy (postmenopausal): Secondary | ICD-10-CM

## 2021-06-23 DIAGNOSIS — I2510 Atherosclerotic heart disease of native coronary artery without angina pectoris: Secondary | ICD-10-CM | POA: Diagnosis present

## 2021-06-23 DIAGNOSIS — Z880 Allergy status to penicillin: Secondary | ICD-10-CM

## 2021-06-23 DIAGNOSIS — Z91018 Allergy to other foods: Secondary | ICD-10-CM

## 2021-06-23 DIAGNOSIS — Z79899 Other long term (current) drug therapy: Secondary | ICD-10-CM

## 2021-06-23 DIAGNOSIS — J9610 Chronic respiratory failure, unspecified whether with hypoxia or hypercapnia: Secondary | ICD-10-CM | POA: Diagnosis present

## 2021-06-23 DIAGNOSIS — I9589 Other hypotension: Secondary | ICD-10-CM | POA: Diagnosis present

## 2021-06-23 DIAGNOSIS — K589 Irritable bowel syndrome without diarrhea: Secondary | ICD-10-CM | POA: Diagnosis present

## 2021-06-23 DIAGNOSIS — A419 Sepsis, unspecified organism: Secondary | ICD-10-CM | POA: Diagnosis not present

## 2021-06-23 DIAGNOSIS — M898X9 Other specified disorders of bone, unspecified site: Secondary | ICD-10-CM | POA: Diagnosis present

## 2021-06-23 DIAGNOSIS — Z7901 Long term (current) use of anticoagulants: Secondary | ICD-10-CM

## 2021-06-23 DIAGNOSIS — E1143 Type 2 diabetes mellitus with diabetic autonomic (poly)neuropathy: Secondary | ICD-10-CM | POA: Diagnosis present

## 2021-06-23 DIAGNOSIS — E1122 Type 2 diabetes mellitus with diabetic chronic kidney disease: Secondary | ICD-10-CM | POA: Diagnosis present

## 2021-06-23 DIAGNOSIS — Z905 Acquired absence of kidney: Secondary | ICD-10-CM

## 2021-06-23 DIAGNOSIS — K921 Melena: Secondary | ICD-10-CM

## 2021-06-23 DIAGNOSIS — Z932 Ileostomy status: Secondary | ICD-10-CM

## 2021-06-23 DIAGNOSIS — Z800 Family history of malignant neoplasm of digestive organs: Secondary | ICD-10-CM

## 2021-06-23 DIAGNOSIS — D62 Acute posthemorrhagic anemia: Secondary | ICD-10-CM | POA: Diagnosis present

## 2021-06-23 DIAGNOSIS — E1142 Type 2 diabetes mellitus with diabetic polyneuropathy: Secondary | ICD-10-CM | POA: Diagnosis present

## 2021-06-23 DIAGNOSIS — K2970 Gastritis, unspecified, without bleeding: Secondary | ICD-10-CM

## 2021-06-23 DIAGNOSIS — Z888 Allergy status to other drugs, medicaments and biological substances status: Secondary | ICD-10-CM

## 2021-06-23 DIAGNOSIS — Z1509 Genetic susceptibility to other malignant neoplasm: Secondary | ICD-10-CM

## 2021-06-23 DIAGNOSIS — Z885 Allergy status to narcotic agent status: Secondary | ICD-10-CM

## 2021-06-23 DIAGNOSIS — Z833 Family history of diabetes mellitus: Secondary | ICD-10-CM

## 2021-06-23 DIAGNOSIS — L98499 Non-pressure chronic ulcer of skin of other sites with unspecified severity: Secondary | ICD-10-CM | POA: Diagnosis present

## 2021-06-23 DIAGNOSIS — Z992 Dependence on renal dialysis: Secondary | ICD-10-CM

## 2021-06-23 DIAGNOSIS — L03311 Cellulitis of abdominal wall: Secondary | ICD-10-CM | POA: Diagnosis present

## 2021-06-23 DIAGNOSIS — Z8051 Family history of malignant neoplasm of kidney: Secondary | ICD-10-CM

## 2021-06-23 DIAGNOSIS — K219 Gastro-esophageal reflux disease without esophagitis: Secondary | ICD-10-CM | POA: Diagnosis present

## 2021-06-23 DIAGNOSIS — E669 Obesity, unspecified: Secondary | ICD-10-CM | POA: Diagnosis present

## 2021-06-23 DIAGNOSIS — Z6839 Body mass index (BMI) 39.0-39.9, adult: Secondary | ICD-10-CM

## 2021-06-23 DIAGNOSIS — K449 Diaphragmatic hernia without obstruction or gangrene: Secondary | ICD-10-CM | POA: Diagnosis present

## 2021-06-23 DIAGNOSIS — F05 Delirium due to known physiological condition: Secondary | ICD-10-CM | POA: Diagnosis not present

## 2021-06-23 DIAGNOSIS — E785 Hyperlipidemia, unspecified: Secondary | ICD-10-CM | POA: Diagnosis present

## 2021-06-23 DIAGNOSIS — E118 Type 2 diabetes mellitus with unspecified complications: Secondary | ICD-10-CM | POA: Diagnosis present

## 2021-06-23 DIAGNOSIS — K3184 Gastroparesis: Secondary | ICD-10-CM | POA: Diagnosis present

## 2021-06-23 DIAGNOSIS — Z9049 Acquired absence of other specified parts of digestive tract: Secondary | ICD-10-CM

## 2021-06-23 DIAGNOSIS — N2581 Secondary hyperparathyroidism of renal origin: Secondary | ICD-10-CM | POA: Diagnosis present

## 2021-06-23 DIAGNOSIS — Z9981 Dependence on supplemental oxygen: Secondary | ICD-10-CM

## 2021-06-23 DIAGNOSIS — D631 Anemia in chronic kidney disease: Secondary | ICD-10-CM | POA: Diagnosis present

## 2021-06-23 DIAGNOSIS — I252 Old myocardial infarction: Secondary | ICD-10-CM

## 2021-06-23 DIAGNOSIS — I132 Hypertensive heart and chronic kidney disease with heart failure and with stage 5 chronic kidney disease, or end stage renal disease: Secondary | ICD-10-CM | POA: Diagnosis present

## 2021-06-23 DIAGNOSIS — F0394 Unspecified dementia, unspecified severity, with anxiety: Secondary | ICD-10-CM | POA: Diagnosis present

## 2021-06-23 DIAGNOSIS — I5042 Chronic combined systolic (congestive) and diastolic (congestive) heart failure: Secondary | ICD-10-CM | POA: Diagnosis present

## 2021-06-23 DIAGNOSIS — D649 Anemia, unspecified: Secondary | ICD-10-CM

## 2021-06-23 DIAGNOSIS — E039 Hypothyroidism, unspecified: Secondary | ICD-10-CM | POA: Diagnosis present

## 2021-06-23 DIAGNOSIS — I480 Paroxysmal atrial fibrillation: Secondary | ICD-10-CM | POA: Diagnosis present

## 2021-06-23 DIAGNOSIS — Z85038 Personal history of other malignant neoplasm of large intestine: Secondary | ICD-10-CM

## 2021-06-23 DIAGNOSIS — Z91048 Other nonmedicinal substance allergy status: Secondary | ICD-10-CM

## 2021-06-23 DIAGNOSIS — K31811 Angiodysplasia of stomach and duodenum with bleeding: Secondary | ICD-10-CM

## 2021-06-23 DIAGNOSIS — M109 Gout, unspecified: Secondary | ICD-10-CM | POA: Diagnosis present

## 2021-06-23 DIAGNOSIS — Z85528 Personal history of other malignant neoplasm of kidney: Secondary | ICD-10-CM

## 2021-06-23 DIAGNOSIS — E871 Hypo-osmolality and hyponatremia: Secondary | ICD-10-CM | POA: Diagnosis present

## 2021-06-23 DIAGNOSIS — L89151 Pressure ulcer of sacral region, stage 1: Secondary | ICD-10-CM

## 2021-06-23 DIAGNOSIS — K435 Parastomal hernia without obstruction or  gangrene: Secondary | ICD-10-CM | POA: Diagnosis present

## 2021-06-23 DIAGNOSIS — Z91040 Latex allergy status: Secondary | ICD-10-CM

## 2021-06-23 DIAGNOSIS — G4733 Obstructive sleep apnea (adult) (pediatric): Secondary | ICD-10-CM | POA: Diagnosis present

## 2021-06-23 DIAGNOSIS — N186 End stage renal disease: Secondary | ICD-10-CM

## 2021-06-23 DIAGNOSIS — Z881 Allergy status to other antibiotic agents status: Secondary | ICD-10-CM

## 2021-06-23 DIAGNOSIS — Z933 Colostomy status: Secondary | ICD-10-CM

## 2021-06-23 MED ORDER — LACTATED RINGERS IV BOLUS
500.0000 mL | Freq: Once | INTRAVENOUS | Status: AC
  Administered 2021-06-24: 500 mL via INTRAVENOUS

## 2021-06-23 MED ORDER — LACTATED RINGERS IV SOLN: INTRAVENOUS | Status: DC

## 2021-06-23 MED ORDER — METRONIDAZOLE 500 MG/100ML IV SOLN
500.0000 mg | Freq: Once | INTRAVENOUS | Status: AC
  Administered 2021-06-24: 500 mg via INTRAVENOUS
  Filled 2021-06-23: qty 100

## 2021-06-23 MED ORDER — LINEZOLID 600 MG/300ML IV SOLN
600.0000 mg | Freq: Once | INTRAVENOUS | Status: AC
  Administered 2021-06-24: 600 mg via INTRAVENOUS
  Filled 2021-06-23: qty 300

## 2021-06-23 MED ORDER — SODIUM CHLORIDE 0.9 % IV SOLN
2.0000 g | Freq: Once | INTRAVENOUS | Status: AC
  Administered 2021-06-24: 2 g via INTRAVENOUS
  Filled 2021-06-23: qty 12.5

## 2021-06-23 NOTE — ED Triage Notes (Signed)
Patient reports urticaria and hypotension after taking prescription antibiotic and narcotic pain medication last week .

## 2021-06-23 NOTE — ED Notes (Addendum)
IV access unobtainable, MD Bero made aware for [place] access.

## 2021-06-23 NOTE — Progress Notes (Signed)
Elink following for Sepsis Protocol 

## 2021-06-23 NOTE — ED Notes (Signed)
Patient reports ulcer like wounds on her left lower abdomen and confusion since taking the morphine and abx.

## 2021-06-24 ENCOUNTER — Encounter (HOSPITAL_COMMUNITY): Payer: Self-pay | Admitting: Family Medicine

## 2021-06-24 ENCOUNTER — Emergency Department (HOSPITAL_COMMUNITY): Payer: Medicare HMO

## 2021-06-24 ENCOUNTER — Ambulatory Visit: Payer: Medicare HMO | Admitting: Licensed Clinical Social Worker

## 2021-06-24 DIAGNOSIS — D631 Anemia in chronic kidney disease: Secondary | ICD-10-CM | POA: Diagnosis present

## 2021-06-24 DIAGNOSIS — D1339 Benign neoplasm of other parts of small intestine: Secondary | ICD-10-CM | POA: Diagnosis not present

## 2021-06-24 DIAGNOSIS — I9589 Other hypotension: Secondary | ICD-10-CM | POA: Diagnosis present

## 2021-06-24 DIAGNOSIS — L03311 Cellulitis of abdominal wall: Secondary | ICD-10-CM | POA: Diagnosis present

## 2021-06-24 DIAGNOSIS — E871 Hypo-osmolality and hyponatremia: Secondary | ICD-10-CM | POA: Diagnosis present

## 2021-06-24 DIAGNOSIS — F05 Delirium due to known physiological condition: Secondary | ICD-10-CM | POA: Diagnosis not present

## 2021-06-24 DIAGNOSIS — L89151 Pressure ulcer of sacral region, stage 1: Secondary | ICD-10-CM | POA: Diagnosis not present

## 2021-06-24 DIAGNOSIS — Z85038 Personal history of other malignant neoplasm of large intestine: Secondary | ICD-10-CM | POA: Diagnosis not present

## 2021-06-24 DIAGNOSIS — I132 Hypertensive heart and chronic kidney disease with heart failure and with stage 5 chronic kidney disease, or end stage renal disease: Secondary | ICD-10-CM | POA: Diagnosis present

## 2021-06-24 DIAGNOSIS — F0394 Unspecified dementia, unspecified severity, with anxiety: Secondary | ICD-10-CM | POA: Diagnosis present

## 2021-06-24 DIAGNOSIS — Z515 Encounter for palliative care: Secondary | ICD-10-CM | POA: Diagnosis not present

## 2021-06-24 DIAGNOSIS — L98499 Non-pressure chronic ulcer of skin of other sites with unspecified severity: Secondary | ICD-10-CM | POA: Diagnosis present

## 2021-06-24 DIAGNOSIS — A419 Sepsis, unspecified organism: Secondary | ICD-10-CM | POA: Diagnosis present

## 2021-06-24 DIAGNOSIS — K921 Melena: Secondary | ICD-10-CM | POA: Diagnosis not present

## 2021-06-24 DIAGNOSIS — E1143 Type 2 diabetes mellitus with diabetic autonomic (poly)neuropathy: Secondary | ICD-10-CM | POA: Diagnosis present

## 2021-06-24 DIAGNOSIS — K31811 Angiodysplasia of stomach and duodenum with bleeding: Secondary | ICD-10-CM | POA: Diagnosis present

## 2021-06-24 DIAGNOSIS — Z992 Dependence on renal dialysis: Secondary | ICD-10-CM | POA: Diagnosis not present

## 2021-06-24 DIAGNOSIS — N2581 Secondary hyperparathyroidism of renal origin: Secondary | ICD-10-CM | POA: Diagnosis present

## 2021-06-24 DIAGNOSIS — I5042 Chronic combined systolic (congestive) and diastolic (congestive) heart failure: Secondary | ICD-10-CM | POA: Diagnosis present

## 2021-06-24 DIAGNOSIS — Z7189 Other specified counseling: Secondary | ICD-10-CM | POA: Diagnosis not present

## 2021-06-24 DIAGNOSIS — E1122 Type 2 diabetes mellitus with diabetic chronic kidney disease: Secondary | ICD-10-CM | POA: Diagnosis present

## 2021-06-24 DIAGNOSIS — E1142 Type 2 diabetes mellitus with diabetic polyneuropathy: Secondary | ICD-10-CM | POA: Diagnosis present

## 2021-06-24 DIAGNOSIS — K2971 Gastritis, unspecified, with bleeding: Secondary | ICD-10-CM | POA: Diagnosis not present

## 2021-06-24 DIAGNOSIS — J9610 Chronic respiratory failure, unspecified whether with hypoxia or hypercapnia: Secondary | ICD-10-CM | POA: Diagnosis present

## 2021-06-24 DIAGNOSIS — E039 Hypothyroidism, unspecified: Secondary | ICD-10-CM | POA: Diagnosis present

## 2021-06-24 DIAGNOSIS — D62 Acute posthemorrhagic anemia: Secondary | ICD-10-CM | POA: Diagnosis present

## 2021-06-24 DIAGNOSIS — Z6839 Body mass index (BMI) 39.0-39.9, adult: Secondary | ICD-10-CM | POA: Diagnosis not present

## 2021-06-24 DIAGNOSIS — E669 Obesity, unspecified: Secondary | ICD-10-CM | POA: Diagnosis present

## 2021-06-24 DIAGNOSIS — N186 End stage renal disease: Secondary | ICD-10-CM | POA: Diagnosis present

## 2021-06-24 DIAGNOSIS — D649 Anemia, unspecified: Secondary | ICD-10-CM | POA: Diagnosis not present

## 2021-06-24 DIAGNOSIS — K449 Diaphragmatic hernia without obstruction or gangrene: Secondary | ICD-10-CM | POA: Diagnosis not present

## 2021-06-24 DIAGNOSIS — I480 Paroxysmal atrial fibrillation: Secondary | ICD-10-CM | POA: Diagnosis present

## 2021-06-24 DIAGNOSIS — K2970 Gastritis, unspecified, without bleeding: Secondary | ICD-10-CM | POA: Diagnosis not present

## 2021-06-24 DIAGNOSIS — Z1509 Genetic susceptibility to other malignant neoplasm: Secondary | ICD-10-CM | POA: Diagnosis not present

## 2021-06-24 LAB — CBC
HCT: 21.9 % — ABNORMAL LOW (ref 36.0–46.0)
Hemoglobin: 6.1 g/dL — CL (ref 12.0–15.0)
MCH: 22.3 pg — ABNORMAL LOW (ref 26.0–34.0)
MCHC: 27.9 g/dL — ABNORMAL LOW (ref 30.0–36.0)
MCV: 79.9 fL — ABNORMAL LOW (ref 80.0–100.0)
Platelets: 456 10*3/uL — ABNORMAL HIGH (ref 150–400)
RBC: 2.74 MIL/uL — ABNORMAL LOW (ref 3.87–5.11)
RDW: 18.9 % — ABNORMAL HIGH (ref 11.5–15.5)
WBC: 15.7 10*3/uL — ABNORMAL HIGH (ref 4.0–10.5)
nRBC: 0 % (ref 0.0–0.2)

## 2021-06-24 LAB — CBC WITH DIFFERENTIAL/PLATELET
Abs Immature Granulocytes: 0.11 10*3/uL — ABNORMAL HIGH (ref 0.00–0.07)
Basophils Absolute: 0.1 10*3/uL (ref 0.0–0.1)
Basophils Relative: 0 %
Eosinophils Absolute: 0.1 10*3/uL (ref 0.0–0.5)
Eosinophils Relative: 1 %
HCT: 26.7 % — ABNORMAL LOW (ref 36.0–46.0)
Hemoglobin: 7.5 g/dL — ABNORMAL LOW (ref 12.0–15.0)
Immature Granulocytes: 1 %
Lymphocytes Relative: 8 %
Lymphs Abs: 1.4 10*3/uL (ref 0.7–4.0)
MCH: 22.7 pg — ABNORMAL LOW (ref 26.0–34.0)
MCHC: 28.1 g/dL — ABNORMAL LOW (ref 30.0–36.0)
MCV: 80.7 fL (ref 80.0–100.0)
Monocytes Absolute: 1.2 10*3/uL — ABNORMAL HIGH (ref 0.1–1.0)
Monocytes Relative: 7 %
Neutro Abs: 14.4 10*3/uL — ABNORMAL HIGH (ref 1.7–7.7)
Neutrophils Relative %: 83 %
Platelets: 566 10*3/uL — ABNORMAL HIGH (ref 150–400)
RBC: 3.31 MIL/uL — ABNORMAL LOW (ref 3.87–5.11)
RDW: 18.8 % — ABNORMAL HIGH (ref 11.5–15.5)
WBC: 17.2 10*3/uL — ABNORMAL HIGH (ref 4.0–10.5)
nRBC: 0 % (ref 0.0–0.2)

## 2021-06-24 LAB — PHOSPHORUS: Phosphorus: 8.1 mg/dL — ABNORMAL HIGH (ref 2.5–4.6)

## 2021-06-24 LAB — COMPREHENSIVE METABOLIC PANEL
ALT: 18 U/L (ref 0–44)
ALT: 13 U/L (ref 0–44)
AST: 27 U/L (ref 15–41)
AST: 24 U/L (ref 15–41)
Albumin: 2.2 g/dL — ABNORMAL LOW (ref 3.5–5.0)
Albumin: 1.8 g/dL — ABNORMAL LOW (ref 3.5–5.0)
Alkaline Phosphatase: 103 U/L (ref 38–126)
Alkaline Phosphatase: 77 U/L (ref 38–126)
Anion gap: 15 (ref 5–15)
Anion gap: 12 (ref 5–15)
BUN: 31 mg/dL — ABNORMAL HIGH (ref 8–23)
BUN: 31 mg/dL — ABNORMAL HIGH (ref 8–23)
CO2: 21 mmol/L — ABNORMAL LOW (ref 22–32)
CO2: 21 mmol/L — ABNORMAL LOW (ref 22–32)
Calcium: 8.8 mg/dL — ABNORMAL LOW (ref 8.9–10.3)
Calcium: 8.1 mg/dL — ABNORMAL LOW (ref 8.9–10.3)
Chloride: 98 mmol/L (ref 98–111)
Chloride: 99 mmol/L (ref 98–111)
Creatinine, Ser: 10 mg/dL — ABNORMAL HIGH (ref 0.44–1.00)
Creatinine, Ser: 9.36 mg/dL — ABNORMAL HIGH (ref 0.44–1.00)
GFR, Estimated: 4 mL/min — ABNORMAL LOW (ref >60)
GFR, Estimated: 4 mL/min — ABNORMAL LOW (ref >60)
Glucose, Bld: 117 mg/dL — ABNORMAL HIGH (ref 70–99)
Glucose, Bld: 157 mg/dL — ABNORMAL HIGH (ref 70–99)
Potassium: 4.7 mmol/L (ref 3.5–5.1)
Potassium: 4.4 mmol/L (ref 3.5–5.1)
Sodium: 134 mmol/L — ABNORMAL LOW (ref 135–145)
Sodium: 132 mmol/L — ABNORMAL LOW (ref 135–145)
Total Bilirubin: 0.6 mg/dL (ref 0.3–1.2)
Total Bilirubin: 0.3 mg/dL (ref 0.3–1.2)
Total Protein: 5.5 g/dL — ABNORMAL LOW (ref 6.5–8.1)
Total Protein: 4.6 g/dL — ABNORMAL LOW (ref 6.5–8.1)

## 2021-06-24 LAB — LACTIC ACID, PLASMA
Lactic Acid, Venous: 3.6 mmol/L (ref 0.5–1.9)
Lactic Acid, Venous: 2.6 mmol/L (ref 0.5–1.9)
Lactic Acid, Venous: 2.5 mmol/L (ref 0.5–1.9)
Lactic Acid, Venous: 2.6 mmol/L (ref 0.5–1.9)

## 2021-06-24 LAB — HEMOGLOBIN AND HEMATOCRIT, BLOOD
HCT: 29.3 % — ABNORMAL LOW (ref 36.0–46.0)
Hemoglobin: 8 g/dL — ABNORMAL LOW (ref 12.0–15.0)

## 2021-06-24 LAB — GLUCOSE, CAPILLARY
Glucose-Capillary: 98 mg/dL (ref 70–99)
Glucose-Capillary: 116 mg/dL — ABNORMAL HIGH (ref 70–99)

## 2021-06-24 LAB — PROTIME-INR
INR: 1.5 — ABNORMAL HIGH (ref 0.8–1.2)
Prothrombin Time: 17.6 seconds — ABNORMAL HIGH (ref 11.4–15.2)

## 2021-06-24 LAB — PREPARE RBC (CROSSMATCH)

## 2021-06-24 LAB — HEPATITIS B SURFACE ANTIBODY,QUALITATIVE: Hep B S Ab: NONREACTIVE

## 2021-06-24 LAB — TROPONIN I (HIGH SENSITIVITY)
Troponin I (High Sensitivity): 28 ng/L — ABNORMAL HIGH (ref <18)
Troponin I (High Sensitivity): 27 ng/L — ABNORMAL HIGH (ref <18)

## 2021-06-24 LAB — APTT: aPTT: 50 seconds — ABNORMAL HIGH (ref 24–36)

## 2021-06-24 LAB — HEPATITIS B SURFACE ANTIGEN: Hepatitis B Surface Ag: NONREACTIVE

## 2021-06-24 MED ORDER — INSULIN ASPART 100 UNIT/ML IJ SOLN: 0.0000 [IU] | Freq: Three times a day (TID) | INTRAMUSCULAR | Status: DC

## 2021-06-24 MED ORDER — ALBUMIN HUMAN 25 % IV SOLN: 25.0000 g | Freq: Once | INTRAVENOUS | Status: AC

## 2021-06-24 MED ORDER — IOHEXOL 300 MG/ML  SOLN
100.0000 mL | Freq: Once | INTRAMUSCULAR | Status: AC | PRN
  Administered 2021-06-24: 100 mL via INTRAVENOUS

## 2021-06-24 MED ORDER — METOCLOPRAMIDE HCL 10 MG PO TABS
5.0000 mg | ORAL_TABLET | Freq: Three times a day (TID) | ORAL | Status: DC
  Administered 2021-06-24 – 2021-07-01 (×25): 5 mg via ORAL
  Filled 2021-06-24 (×25): qty 1

## 2021-06-24 MED ORDER — CHLORHEXIDINE GLUCONATE CLOTH 2 % EX PADS
6.0000 | MEDICATED_PAD | Freq: Every day | CUTANEOUS | Status: DC
  Administered 2021-06-25: 6 via TOPICAL

## 2021-06-24 MED ORDER — FAMOTIDINE 20 MG PO TABS
20.0000 mg | ORAL_TABLET | Freq: Every morning | ORAL | Status: DC
Start: 2021-06-24 — End: 2021-06-24
  Administered 2021-06-24: 20 mg via ORAL
  Filled 2021-06-24: qty 1

## 2021-06-24 MED ORDER — SODIUM CHLORIDE 0.9 % IV SOLN
6.0000 mg/kg | INTRAVENOUS | Status: DC
  Filled 2021-06-24: qty 11

## 2021-06-24 MED ORDER — MIDODRINE HCL 5 MG PO TABS
10.0000 mg | ORAL_TABLET | Freq: Three times a day (TID) | ORAL | Status: DC
Start: 2021-06-24 — End: 2021-07-01
  Administered 2021-06-24 – 2021-07-01 (×20): 10 mg via ORAL
  Filled 2021-06-24 (×19): qty 2

## 2021-06-24 MED ORDER — ALBUMIN HUMAN 25 % IV SOLN
INTRAVENOUS | Status: AC
  Administered 2021-06-24: 25 g via INTRAVENOUS
  Filled 2021-06-24: qty 100

## 2021-06-24 MED ORDER — VITAMIN B-12 1000 MCG PO TABS
500.0000 ug | ORAL_TABLET | Freq: Every day | ORAL | Status: DC
  Administered 2021-06-24 – 2021-07-01 (×7): 500 ug via ORAL
  Filled 2021-06-24 (×7): qty 1

## 2021-06-24 MED ORDER — ACETAMINOPHEN 325 MG PO TABS
650.0000 mg | ORAL_TABLET | Freq: Four times a day (QID) | ORAL | Status: DC | PRN
  Administered 2021-06-25 – 2021-06-29 (×3): 650 mg via ORAL
  Administered 2021-06-29: 325 mg via ORAL
  Administered 2021-07-01: 650 mg via ORAL
  Filled 2021-06-24 (×5): qty 2

## 2021-06-24 MED ORDER — CALCITRIOL 0.5 MCG PO CAPS
0.5000 ug | ORAL_CAPSULE | ORAL | Status: DC
  Administered 2021-06-26: 0.5 ug via ORAL

## 2021-06-24 MED ORDER — ALBUTEROL SULFATE (2.5 MG/3ML) 0.083% IN NEBU
3.0000 mL | INHALATION_SOLUTION | Freq: Four times a day (QID) | RESPIRATORY_TRACT | Status: DC | PRN
Start: 2021-06-24 — End: 2021-07-01

## 2021-06-24 MED ORDER — HEPARIN SODIUM (PORCINE) 1000 UNIT/ML IJ SOLN
INTRAMUSCULAR | Status: AC
  Filled 2021-06-24: qty 2

## 2021-06-24 MED ORDER — SODIUM CHLORIDE 0.9% IV SOLUTION: Freq: Once | INTRAVENOUS | Status: DC

## 2021-06-24 MED ORDER — SODIUM CHLORIDE 0.9 % IV SOLN
6.0000 mg/kg | INTRAVENOUS | Status: DC
  Administered 2021-06-26 – 2021-06-28 (×2): 400 mg via INTRAVENOUS
  Filled 2021-06-24 (×3): qty 8

## 2021-06-24 MED ORDER — SODIUM CHLORIDE 0.9 % IV SOLN
6.0000 mg/kg | Freq: Every day | INTRAVENOUS | Status: DC
  Filled 2021-06-24: qty 11

## 2021-06-24 MED ORDER — ACETAMINOPHEN 500 MG PO TABS: 250.0000 mg | ORAL_TABLET | Freq: Four times a day (QID) | ORAL | Status: DC | PRN

## 2021-06-24 MED ORDER — AMIODARONE HCL 200 MG PO TABS
200.0000 mg | ORAL_TABLET | Freq: Every day | ORAL | Status: DC
  Administered 2021-06-24 – 2021-07-01 (×6): 200 mg via ORAL
  Filled 2021-06-24 (×6): qty 1

## 2021-06-24 MED ORDER — LACTATED RINGERS IV BOLUS: 1000.0000 mL | Freq: Once | INTRAVENOUS | Status: DC

## 2021-06-24 MED ORDER — SODIUM CHLORIDE 0.9 % IV SOLN: INTRAVENOUS | Status: DC

## 2021-06-24 MED ORDER — ALBUTEROL SULFATE (5 MG/ML) 0.5% IN NEBU: 2.5000 mg | INHALATION_SOLUTION | Freq: Two times a day (BID) | RESPIRATORY_TRACT | Status: DC | PRN

## 2021-06-24 MED ORDER — SODIUM CHLORIDE 0.9 % IV SOLN
6.0000 mg/kg | Freq: Every day | INTRAVENOUS | Status: AC
  Administered 2021-06-24: 550 mg via INTRAVENOUS
  Filled 2021-06-24: qty 11

## 2021-06-24 MED ORDER — SODIUM CHLORIDE 0.9 % IV BOLUS
250.0000 mL | Freq: Once | INTRAVENOUS | Status: AC
Start: 2021-06-24 — End: 2021-06-24
  Administered 2021-06-24: 250 mL via INTRAVENOUS

## 2021-06-24 MED ORDER — SODIUM CHLORIDE 0.9 % IV SOLN
6.0000 mg/kg | INTRAVENOUS | Status: DC
  Filled 2021-06-24: qty 8

## 2021-06-24 MED ORDER — LEVOTHYROXINE SODIUM 75 MCG PO TABS
175.0000 ug | ORAL_TABLET | Freq: Every day | ORAL | Status: DC
  Administered 2021-06-24 – 2021-07-01 (×8): 175 ug via ORAL
  Filled 2021-06-24 (×8): qty 1

## 2021-06-24 MED ORDER — HEPARIN SODIUM (PORCINE) 1000 UNIT/ML DIALYSIS: 1500.0000 [IU] | INTRAMUSCULAR | Status: DC | PRN

## 2021-06-24 MED ORDER — CLONAZEPAM 0.5 MG PO TABS: 0.5000 mg | ORAL_TABLET | ORAL | Status: DC

## 2021-06-24 MED ORDER — SODIUM CHLORIDE 0.9 % IV SOLN
1.0000 g | INTRAVENOUS | Status: AC
  Administered 2021-06-24 – 2021-06-26 (×3): 1 g via INTRAVENOUS
  Filled 2021-06-24 (×5): qty 10

## 2021-06-24 MED ORDER — CLONAZEPAM 1 MG PO TABS
1.0000 mg | ORAL_TABLET | ORAL | Status: DC
  Administered 2021-06-25 – 2021-06-30 (×3): 1 mg via ORAL
  Filled 2021-06-24 (×3): qty 1

## 2021-06-24 MED ORDER — CLONAZEPAM 0.5 MG PO TABS
0.5000 mg | ORAL_TABLET | ORAL | Status: DC
  Administered 2021-06-25 – 2021-06-28 (×3): 0.5 mg via ORAL
  Filled 2021-06-24 (×4): qty 1

## 2021-06-24 MED ORDER — ACETAMINOPHEN 650 MG RE SUPP: 650.0000 mg | Freq: Four times a day (QID) | RECTAL | Status: DC | PRN

## 2021-06-24 MED ORDER — FAMOTIDINE 20 MG PO TABS
10.0000 mg | ORAL_TABLET | Freq: Every morning | ORAL | Status: DC
Start: 2021-06-25 — End: 2021-06-27
  Administered 2021-06-25 – 2021-06-27 (×3): 10 mg via ORAL
  Filled 2021-06-24 (×3): qty 1

## 2021-06-24 MED ORDER — DARBEPOETIN ALFA 200 MCG/0.4ML IJ SOSY
200.0000 ug | PREFILLED_SYRINGE | INTRAMUSCULAR | Status: DC
  Administered 2021-06-26: 200 ug via INTRAVENOUS
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
## 2021-06-24 NOTE — Progress Notes (Signed)
Pt receives out-pt HD at [facility] on TTS. Pt arrives at 11:30 for 11:50 chair time. Will assist as needed.   Melven Sartorius Renal Navigator [phone]

## 2021-06-24 NOTE — Progress Notes (Signed)
Family Medicine Teaching Service Daily Progress Note Intern Pager: [phone]  Patient name: [name] Medical record number: [id number] Date of birth: 02/02/1940 Age: 81 y.o. Gender: female  Primary Care Provider: [name], MD Consultants: Nephrology Code Status: Full  Pt Overview and Major Events to Date:  5/30: Admitted  Assessment and Plan:  [name] is a 81 year old female presenting with hypotension and confusion.  Past medical history significant for ESRD on HD, T2DM, paroxysmal atrial fibrillation on Eliquis, CAD, HLD, GERD, asthma, Lynch syndrome c/b colon cancer status post right hemicolectomy with ileostomy.  Sepsis likely 2/2 abdominal wound Altered mental status Most recent blood pressure 82/48, with BP 70-114/31-85 since admission, remains tachycardic and with WBC 15.7, but afebrile.  Wound may be infected, but need to rule out pyoderma gangrenosum.  Altered mental status/somnolence likely multifactorial in the setting of sepsis/anemia/medications and missed HD. Called patient's daughter, who confirmed that patient has taken prednisone without issue; no allergies. 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  Home medication: Midodrine 3 times daily -Continue home midodrine 3 times daily  Anemia of chronic disease Hgb 6.1.  Baseline 9.3. -- Nephrology confirmed okay to transfuse 1 unit PRBCs with dialysis. -Daily CBC - Transfusion threshold <8 -- F/u post-transfusion H&H  ESRD on HD Tuesday/Thursday/Saturday schedule.  Missed HD this past Saturday; nephrology on board to manage HD.   - Fluids to be given as above, per nephrology -Patient to dialyze today  Lynch syndrome Hx of colon cancer  s/p right hemicolectomy with ileostomy WOC on board  T2DM Diet controlled. - vsSSI -CBG monitoring  PAF -Holding home Eliquis for concerns for bleed - Continue home amiodarone  Chronic conditions: CAD-stable, not on any antiplatelets Anxiety-continue home Klonopin 1 mg on nondialysis days and 0.5 mg on dialysis days. Hypothyroidism-continue home Synthroid 175 mcg daily GERD-continue home Pepcid 20 mg daily   FEN/GI: Renal PPx: SCDs Dispo:Pending PT recommendations  pending clinical improvement . Barriers include clinical improvement.   Subjective:  Patient seen with daughter and wound care nurse at bedside.  This morning, patient is largely somnolent, in and out of conversation.  She reports pain is 9 out of 10.  Wound care nurse says that the wounds are consistent with what she has seen in patients with pyoderma gangrenosum.  She advised general surgery to get on board to see if any intervention is needed and that definitive diagnosis is with the dermatologic panel.    Objective: Temp:  [98.3 F (36.8 C)] 98.3 F (36.8 C) (05/30 0058) Pulse Rate:  [103-124] 108 (05/30 0700) Resp:  [10-24] 20 (05/30 0700) BP: (70-141)/(31-112) 82/48 (05/30 0700) SpO2:  [92 %-100 %] 94 % (05/30 0700) Physical Exam: General: Toxic appearing obese female lying in bed in pain Cardiovascular: Tachycardic rate, no murmurs/rubs/gallops Respiratory: CTA bilaterally, normal WOB on room air Abdomen: Wound care nurse cleaning ostomy site.  Ulceration healing appropriately around ostomy on right side of abdomen.  Left lower quadrant with purulent 2 x 2 centimeter wound and surrounding cellulitis.  (Picture see below). Firmness above the ulcer, as well as extending up the R lateral side of the abdomen just beneath the R breast. Extremities: No lower extremity edema    Laboratory: Recent Labs  Lab 06/24/21 0039 06/24/21 0505  WBC 17.2*  15.7*  HGB 7.5* 6.1*  HCT 26.7* 21.9*  PLT 566* 456*    Recent Labs  Lab 06/24/21 0039 06/24/21 0505  NA 134* 132*  K 4.7 4.4  CL 98 99  CO2 21* 21*  BUN 31* 31*  CREATININE 10.00* 9.36*  CALCIUM 8.8* 8.1*  PROT 5.5* 4.6*  BILITOT 0.6 0.3  ALKPHOS 103 77  ALT 18 13  AST 27 24  GLUCOSE 117* 157*      Imaging/Diagnostic Tests: EXAM: CT ABDOMEN AND PELVIS WITH CONTRAST  IMPRESSION: 1. There is a skin defect in the left lateral aspect of the lower abdominal wall fat panniculus measuring 2 cm in diameter but only 3-4 mm in depth. 2. There may be at least mild underlying cellulitis but there is no underlying abscess. 3. Pelvic floor laxity with cystocele, chronic. 4. 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 5. 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 6. Small pleural effusions smaller than previously. 7. 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 8. Remaining findings described above.     Electronically Signed   By: Telford Nab M.D.   On: 06/24/2021 01:43  Rosezetta Schlatter, MD 06/24/2021, 7:56 AM PGY-1, [place] Intern pager: [phone], text pages welcome

## 2021-06-24 NOTE — ED Provider Notes (Addendum)
[facility] EMERGENCY DEPARTMENT Provider Note   CSN: [id number] Arrival date & time: 06/23/21  2212     History  No chief complaint on file.   [name] is a 81 y.o. female with history of ESRD on dialysis Tuesday/Thursday/Saturday, last session Thursday, type 2 diabetes, and colon cancer status post right hemicolectomy with colostomy in the right lower abdomen who presents with concern for hypotension at home and confusion.  Patient has known left lower abdominal skin ulcers, chronic but enlarging now with purulent drainage in the last 2 weeks.  Was started on oral doxycycline and Flagyl in the outpatient setting without improvement.  Was also started on morphine 15 mg tablets on 5/24 which she stated made her itchy.  She subsequently prescribed hydromorphone 2 mg tablets on 5/26.  Per chart review patient also has history of atrial fibrillation and hypotension on midodrine 3 times daily.  Level 5 caveat due to acuity of patient's presentation upon arrival.  Per chart review patient was recently admitted to family medicine service for volume overload in the context of missed hemodialysis.  In addition to the above listed history she also has history of Lynch syndrome, hypothyroidism, asthma, GERD, and CAD.  She is anticoagulated on Eliquis.   HPI     Home Medications Prior to Admission medications   Medication Sig Start Date End Date Taking? Authorizing Provider  acetaminophen (TYLENOL) 500 MG tablet Take 0.5 tablets (250 mg total) by mouth every 6 (six) hours as needed for mild pain (pain). Patient taking differently: Take 1,000 mg by mouth daily as needed for mild pain (pain). 11/22/16  Yes Aline August, MD  albuterol (VENTOLIN HFA) 108 (90 Base) MCG/ACT inhaler Inhale 1-2 puffs into the lungs every 6 (six) hours as needed for wheezing or shortness of breath.   Yes [provider]  amiodarone (PACERONE) 200 MG tablet TAKE 1 TABLET EVERY  DAY Patient taking differently: Take 200 mg by mouth daily. 03/26/21  Yes [name], Peter M, MD  apixaban (ELIQUIS) 2.5 MG TABS tablet Take 1 tablet (2.5 mg total) by mouth 2 (two) times daily. 05/16/21  Yes [name], MD  cetirizine (ZYRTEC) 10 MG tablet Take 10 mg by mouth daily.    Yes [provider]  clonazePAM (KLONOPIN) 1 MG tablet Take 0.5-1 mg by mouth See admin instructions. Take 1 mg by mouth at bedtime on Sun/Mon/Wed/Fri and 0.5 mg at bedtime on Tues/Thurs/Sat- may take an additional 0.5-1 mg up to two (more) times a day as needed for anxiety (total combined daily dose is a max of 3 milligrams)   Yes [provider]  doxycycline (VIBRA-TABS) 100 MG tablet Take 1 tablet (100 mg total) by mouth 2 (two) times daily for 10 days. Patient taking differently: Take 100 mg by mouth See admin instructions. Bid x 10 days 06/18/21 06/28/21 Yes Wells Guiles, DO  famotidine (PEPCID) 20 MG tablet Take 20 mg by mouth in the morning.   Yes [provider]  fluticasone (FLONASE) 50 MCG/ACT nasal spray Place 1 spray into both nostrils daily as needed for allergies or rhinitis. 10/25/19  Yes [provider]  HYDROmorphone (DILAUDID) 2 MG tablet Take 0.5 tablets (1 mg total) by mouth every 4 (four) hours as needed for severe pain. 06/20/21  Yes Wells Guiles, DO  levalbuterol Penne Lash) 1.25 MG/3ML nebulizer solution Take 1.25 mg by nebulization 2 (two) times daily as needed for wheezing or shortness of breath. 02/17/21  Yes [provider]  metoCLOPramide (REGLAN) 5 MG tablet TAKE ONE TABLET FOUR TIMES A DAY BEFORE MEALS AND AT BEDTIME Patient taking differently: Take 5 mg by mouth 4 (four) times daily -  before meals and at bedtime. 06/04/21  Yes [name], MD  midodrine (PROAMATINE) 10 MG tablet Take 1 tablet (10 mg total) by mouth 3 (three) times daily. 04/21/21 07/20/21 Yes Pray, Norwood Levo, MD  morphine (MSIR) 15 MG tablet Take 15 mg by mouth every 4 (four) hours  as needed for severe pain.   Yes [provider]  multivitamin (RENA-VIT) TABS tablet Take 1 tablet by mouth at bedtime.   Yes [provider]  OXYGEN Inhale 3 L into the lungs as needed (shortness of breath).   Yes [provider]  promethazine (PHENERGAN) 12.5 MG tablet Take 12.5 mg by mouth every 6 (six) hours as needed for nausea or vomiting.   Yes [provider]  SYNTHROID 175 MCG tablet Take 175 mcg by mouth daily before breakfast. 08/14/19  Yes [provider]  vitamin B-12 (CYANOCOBALAMIN) 500 MCG tablet Take 500 mcg by mouth daily.   Yes [provider]  metroNIDAZOLE (FLAGYL) 500 MG tablet Take 1 tablet (500 mg total) by mouth 3 (three) times daily for 10 days. Patient not taking: Reported on 06/24/2021 06/18/21 06/28/21  Wells Guiles, DO      Allergies    Adhesive [tape], Avelox [moxifloxacin], Banana, Blueberry flavor, Cantaloupe extract allergy skin test, Cefprozil, Cetacaine [butamben-tetracaine-benzocaine], Dicyclomine, Food, Imdur [isosorbide nitrate], Januvia [sitagliptin], Lipitor [atorvastatin], Losartan potassium, Nitroglycerin, Omeprazole, Oxycodone, Penicillins, Prednisone, Vancomycin, Watermelon [citrullus vulgaris], Hydrocodone, Latex, Tamiflu [oseltamivir], Feraheme [ferumoxytol], Other, Lasix [furosemide], and Mupirocin    Review of Systems   Review of Systems  Skin:  Positive for wound.  Neurological:  Positive for weakness and light-headedness.  Psychiatric/Behavioral:  Positive for confusion.    Physical Exam Updated Vital Signs BP (!) 141/112   Pulse (!) 121   Temp 98.3 F (36.8 C) (Oral)   Resp 10   SpO2 96%  Physical Exam Vitals and nursing note reviewed. Exam conducted with a chaperone present.  Constitutional:      Appearance: She is obese. She is not toxic-appearing.  HENT:     Head: Normocephalic and atraumatic.     Nose: Nose normal.     Mouth/Throat:     Mouth: Mucous membranes are moist.      Pharynx: No oropharyngeal exudate or posterior oropharyngeal erythema.  Eyes:     General:        Right eye: No discharge.        Left eye: No discharge.     Extraocular Movements: Extraocular movements intact.     Conjunctiva/sclera: Conjunctivae normal.     Pupils: Pupils are equal, round, and reactive to light.  Cardiovascular:     Rate and Rhythm: Regular rhythm. Tachycardia present.     Pulses: Normal pulses.     Heart sounds: Normal heart sounds.  Pulmonary:     Effort: Pulmonary effort is normal. No respiratory distress.     Breath sounds: Normal breath sounds. No wheezing or rales.  Chest:    Abdominal:     General: Bowel sounds are normal. There is no distension.     Palpations: Abdomen is soft.     Tenderness: There is no abdominal tenderness. There is no guarding or rebound.    Genitourinary:    Rectum: Guaiac result negative.  Musculoskeletal:        General: No deformity.  Cervical back: Neck supple.     Right lower leg: 1+ Edema present.     Left lower leg: 1+ Edema present.  Skin:    General: Skin is warm and dry.     Capillary Refill: Capillary refill takes less than 2 seconds.     Findings: Wound present.  Neurological:     General: No focal deficit present.     Mental Status: She is alert and oriented to person, place, and time. Mental status is at baseline.  Psychiatric:        Mood and Affect: Mood normal.    ED Results / Procedures / Treatments   Labs (all labs ordered are listed, but only abnormal results are displayed) Labs Reviewed  LACTIC ACID, PLASMA - Abnormal; Notable for the following components:      Result Value   Lactic Acid, Venous 3.6 (*)    All other components within normal limits  COMPREHENSIVE METABOLIC PANEL - Abnormal; Notable for the following components:   Sodium 134 (*)    CO2 21 (*)    Glucose, Bld 117 (*)    BUN 31 (*)    Creatinine, Ser 10.00 (*)    Calcium 8.8 (*)    Total Protein 5.5 (*)    Albumin 2.2 (*)     GFR, Estimated 4 (*)    All other components within normal limits  CBC WITH DIFFERENTIAL/PLATELET - Abnormal; Notable for the following components:   WBC 17.2 (*)    RBC 3.31 (*)    Hemoglobin 7.5 (*)    HCT 26.7 (*)    MCH 22.7 (*)    MCHC 28.1 (*)    RDW 18.8 (*)    Platelets 566 (*)    Neutro Abs 14.4 (*)    Monocytes Absolute 1.2 (*)    Abs Immature Granulocytes 0.11 (*)    All other components within normal limits  PROTIME-INR - Abnormal; Notable for the following components:   Prothrombin Time 17.6 (*)    INR 1.5 (*)    All other components within normal limits  APTT - Abnormal; Notable for the following components:   aPTT 50 (*)    All other components within normal limits  TROPONIN I (HIGH SENSITIVITY) - Abnormal; Notable for the following components:   Troponin I (High Sensitivity) 28 (*)    All other components within normal limits  RESP PANEL BY RT-PCR (FLU A&B, COVID) ARPGX2  CULTURE, BLOOD (ROUTINE X 2)  CULTURE, BLOOD (ROUTINE X 2)  LACTIC ACID, PLASMA  TROPONIN I (HIGH SENSITIVITY)    EKG None  Radiology CT ABDOMEN PELVIS W CONTRAST  Result Date: 06/24/2021 CLINICAL DATA:  Left lower abdominal wound.  ESRD patient. EXAM: CT ABDOMEN AND PELVIS WITH CONTRAST TECHNIQUE: Multidetector CT imaging of the abdomen and pelvis was performed using the standard protocol following bolus administration of intravenous contrast. RADIATION DOSE REDUCTION: This exam was performed according to the departmental dose-optimization program which includes automated exposure control, adjustment of the mA and/or kV according to patient size and/or use of iterative reconstruction technique. CONTRAST:  188m OMNIPAQUE IOHEXOL 300 MG/ML  SOLN COMPARISON:  CT without contrast 05/22/2021, CT with contrast 03/20/2020 FINDINGS: Lower chest: There are small pleural effusions, smaller than previously. There is scattered linear scar-like opacity in the lung bases. Mild cardiomegaly is again noted  with three-vessel coronary artery calcifications and calcifications in the posterior mitral ring. The tip of an infusion or dialysis catheter is noted in the upper right atrium.  Hepatobiliary: 17 cm in length with homogeneous enhancement. Gallbladder is absent. There is chronic postcholecystectomy common bile duct prominence at 8.6 mm. Pancreas: No focal abnormality or ductal dilatation. Spleen: Normal in size with homogeneous enhancement. Adrenals/Urinary Tract: Surgically absent right kidney. There is no adrenal mass, no mass enhancement in the left kidney. There is a small cyst in the anterior left kidney. There is cortical thinning and small size of the left kidney. There is no stone or hydronephrosis. 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 There is no hydronephrosis or stone. There is no bladder thickening with chronic pelvic floor laxity and a small cystocele again shown. Stomach/Bowel: There are thickened folds in the stomach but no more than previously. Probable chronic gastritis. 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 The appendix is absent. The large intestine is predominantly contracted. There are no findings of colitis, diverticulitis or obstruction. Vascular/Lymphatic: Moderate to heavy aortoiliac atherosclerosis. No AAA. No adenopathy. Reproductive: Surgically absent uterus.  No adnexal mass is seen. Other: A large right lower quadrant parastomal hernia containing a portion of the mid transverse colon and multiple small bowel segments is again shown, arising through a wall defect of 6 cm and again measuring up to 12.1 x 7.0 x 13 cm. There is trace ascites chronically in the posterior deep pelvis. The open wound of the left lower abdomen referred to in the history appears to be in the lateral left lower abdominal fatty panniculus and measures about 2 cm diameter but only 3-4 mm in depth. No underlying fluid collection is seen but there may be at least mild  underlying cellulitis. There are small umbilical and inguinal fat hernias. Musculoskeletal: There is bridging enthesopathy of the lower thoracic spine. No acute or worrisome focal osseous abnormality is seen. Osteopenia. IMPRESSION: 1. There is a skin defect in the left lateral aspect of the lower abdominal wall fat panniculus measuring 2 cm in diameter but only 3-4 mm in depth. 2. There may be at least mild underlying cellulitis but there is no underlying abscess. 3. Pelvic floor laxity with cystocele, chronic. 4. 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 5. 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 6. Small pleural effusions smaller than previously. 7. 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 8. Remaining findings described above. Electronically Signed   By: Telford Nab M.D.   On: 06/24/2021 01:43   DG Chest Port 1 View  Result Date: 06/23/2021 CLINICAL DATA:  Questionable sepsis - evaluate for abnormality EXAM: PORTABLE CHEST 1 VIEW COMPARISON:  05/22/2021 FINDINGS: Left dialysis catheter remains in place, unchanged. Loop recorder device projects over the left chest. Cardiomegaly. No confluent opacities, effusions or edema. No acute bony abnormality. Aortic atherosclerosis. IMPRESSION: Cardiomegaly.  No active disease. Electronically Signed   By: Rolm Baptise M.D.   On: 06/23/2021 23:40    Procedures .Critical Care Performed by: Emeline Darling, PA-C Authorized by: Emeline Darling, PA-C   Critical care provider statement:    Critical care time (minutes):  45   Critical care was time spent personally by me on the following activities:  Development of treatment plan with patient or surrogate, discussions with consultants, evaluation of patient's response to treatment, examination of patient, obtaining history from patient or surrogate, ordering and performing treatments and  interventions, ordering and review of laboratory studies, ordering and review of radiographic studies, pulse oximetry and re-evaluation of patient's condition    Medications Ordered  in ED Medications  lactated ringers infusion ( Intravenous New Bag/Given 06/24/21 0231)  metroNIDAZOLE (FLAGYL) IVPB 500 mg (500 mg Intravenous New Bag/Given 06/24/21 0206)  linezolid (ZYVOX) IVPB 600 mg (has no administration in time range)  lactated ringers bolus 500 mL (0 mLs Intravenous Stopped 06/24/21 0233)  ceFEPIme (MAXIPIME) 2 g in sodium chloride 0.9 % 100 mL IVPB (0 g Intravenous Stopped 06/24/21 0123)  iohexol (OMNIPAQUE) 300 MG/ML solution 100 mL (100 mLs Intravenous Contrast Given 06/24/21 0110)    ED Course/ Medical Decision Making/ A&P Clinical Course as of 06/24/21 0320  Mon Jun 23, 2021  2353 Consult to pharmacist, Nicole Kindred, who reviewed the patient's chart and recommends proceeding with 2 g of cefepime, Flagyl, and 600 mg of linezolid.  Antibiotics to be ordered as one-time dose in ED as patient will require ID consult given extensive antibiotic reaction in the past and need for linezolid.  I appreciate his collaboration in the care of this patient. [RS]  Tue Jun 24, 2021  0236 Consult to family medicine who are agreeable to excepting this patient to their service.  Appreciate their collaboration in care of this patient. [RS]  1962 Dr. Adah Salvage at the bedside.  [RS]    Clinical Course User Index [RS] [name]                           Medical Decision Making 81 year old female who presents for weakness, pretension, and confusion at home in context of known  left lower abdominal wounds.  Patient meeting sepsis criteria upon arrival.  Cardiac and hypotensive with systolic pressures in the 80s at time of arrival.  Afebrile orally.  Cardiopulmonary exam as above with tachycardia with regular rhythm.  Abdominal exam as above with colostomy in left lower abdominal wounds with surrounding  tenderness palpation.  No CVAT.  Patient is alert and oriented x3, moving all extremities without focal deficit on neurologic exam.   Amount and/or Complexity of Data Reviewed Independent Historian: caregiver    Details: Daughter External Data Reviewed: notes. Labs: ordered.    Details: CBC with leukocytosis of 17,000, anemia with hemoglobin of 7.5 in context of ESRD on dialysis. CMP with creatinine of 10 near patient's baseline.  Mild hyponatremia 134.  INR elevated 1.5 anticoagulated on Eliquis.  Lactic acid significantly elevated to 3.6.  Troponin mildly elevated 28. Radiology: ordered.    Details: Chest x-ray negative for acute cardiopulmonary disease, visualized by this provider. CT of the abdomen pelvis visualized this provider with left lower abdominal wound and associated cellulitis without underlying abscess or fluid collection.  Otherwise CT notable only for stable chronic changes. ECG/medicine tests: ordered.  Risk Prescription drug management. Decision regarding hospitalization.     Full 30cc/kg bolus not ordered given patient on HD with history of tenuous fluid status in the past, now with SBP> 100.   Patient will require admission to the hospital for septic presentation.  Continuing on IV antibiotics, will require ID consult tomorrow per pharmacy.  Recently admitted to family medicine service.  We will consult their service for readmission at this time.  Consult to family medicine as above.  Patient admitted to their service.  Chrys Racer and her family voiced understanding of her medical evaluation and treatment plan thus far.  Each of their questions was answered to their expressed satisfaction.  They are amenable to plan for admission at this time.  This chart was dictated using voice recognition software, Dragon.  Despite the best efforts of this provider to proofread and correct errors, errors may still occur which can change documentation meaning.   Final Clinical  Impression(s) / ED Diagnoses Final diagnoses:  Sepsis, due to unspecified organism, unspecified whether acute organ dysfunction present [facility])    Rx / DC Orders ED Discharge Orders     None         Emeline Darling, PA-C 06/24/21 0240    Zeb Rawl, Gypsy Balsam, PA-C 06/24/21 0409    Maudie Flakes, MD 06/24/21 [phone]

## 2021-06-24 NOTE — Progress Notes (Signed)
FPTS Brief Progress Note  S: Patient in dialysis when I rounded on her.  She endorses feeling better today.  Denies dizziness, lightheadedness.  Does still have some pain from abdominal wound.  Denies any questions or concerns.  Also talked with nurse in dialysis, who stated her blood pressure got low in the 60s and was given albumin with improvement.  She has remained asymptomatic during this and currently only has about 45 minutes left of dialysis.   O: BP (!) 88/67   Pulse (!) 116   Temp (!) 97.5 F (36.4 C) (Oral)   Resp 16   Ht '5\' 2"'$  (1.575 m)   SpO2 96%   BMI 35.74 kg/m   General: alert, sitting up in bed undergoing dialysis, NAD CV: tachycardic Resp: Breathing comfortably on [facility]   A/P:  Sepsis 2/2 abdominal wound   BP soft 88/67, remains tachycardic and afebrile Currently on IV Cefipime and Daptomycin. Blood cultures pending   - continue current management   ESRD on HD Receiving dialysis currently. So far able to tolerate despite low BP.   Goals of care Palliative care consulted. Full code, not ready for hospice. Not interested in SNF  - Orders reviewed. Labs for AM ordered, which was adjusted as needed.    Shary Key, DO 06/24/2021, 11:19 PM PGY-2, [place] [facility] Resident  Please page [phone] with questions.

## 2021-06-24 NOTE — ED Notes (Signed)
Breakfast order placed ?

## 2021-06-24 NOTE — ED Notes (Signed)
Blood consent in chart Dialysis consent at bedside

## 2021-06-24 NOTE — Consult Note (Signed)
Consultation Note Date: 06/24/2021   Patient Name: [name]  DOB: May 16, 1940  MRN: [id number]  Age / Sex: 81 y.o., female  PCP: [name], MD Referring Physician: Leeanne Rio, MD  Reason for Consultation: Establishing goals of care  HPI/Patient Profile: 81 y.o. 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    Patient missed HD on Saturday due to not feeling well.  Patient septic likely secondary to abdominal wound.  Given patient's multiple chronic comorbidities, PMT has been consulted to assist with goals of care conversation.  Clinical Assessment and Goals of Care:  I have reviewed medical records including EPIC notes, labs and imaging, assessed the patient and then met at the bedside with patient's daughter [name] to discuss diagnosis prognosis, [place], EOL wishes, disposition and options.  I introduced Palliative Medicine as specialized medical care for people living with serious illness. It focuses on providing relief from the symptoms and stress of a serious illness. The goal is to improve quality of life for both the patient and the family.  We discussed a brief life review of the patient and then focused on their current illness.   I attempted to elicit values and goals of care important to the patient.    Medical History Review and Understanding:  Reviewed patient's current illness including sepsis, abdominal  wound, multiple chronic comorbidities with ongoing decline.  Social History: Patient lives with her son Aaron Edelman who they report is autistic and helps with cooking, cleaning, caregiving.  She is separated from her husband. Her daughter assists with attending doctors appointments and coordinating her care.    Functional and Nutritional State: Patient can no longer stand or bathe herself due to fatigue.  Palliative Symptoms: Pain, anxiety, fatigue  Advance Directives: A detailed discussion regarding advanced directives was had.  Family reports they completed paperwork including MOST form with outpatient palliative care (through [facility] [place]).   Code Status: Concepts specific to code status, artifical feeding and hydration, and rehospitalization were considered and discussed.   Discussion: [name] shares priority is preventing further decline, as she has already noticed patient is declining and she worries that she will succumb to her illnesses because they do not have the right resources.  She is emotional, expressing feelings of guilt and uncertainty of whether she is asking for help from the right people or at the right time.  She notes that her mother is becoming more forgetful and there was recently concern with her brother not giving medicines, but stating that he did at his mother's insistence.  They recently enrolled with palliative care through [facility] [place] at the recommendation of dialysis RN.  She is concerned that there are many telehealth visits rather than in person checks to review patient's physical exam and wound in context of overall goals and needs.  She is hopeful for increased visits, but they are not ready to pursue hospice given patient would need to discontinue dialysis. Patient states "I do not want to die" and her daughter shares she will always support patient's wishes and choices.  Patient is fearful of what will  happen to her autistic son, who is  very attached to her and would take it reportedly if she were to pass away.  [name] provides reassurance that she would look out for him.  They are in the process of getting him used to spending less time with her just in case, realizing that she is at high risk for complications and even death.    Daughter also shares that she is torn between caring for the patient and her father who has dementia.  She hopes for medicine packs to be able to split up all pills for patient's son to give.  There has been discussion with primary care and palliative medicine outpatient to have this covered.  She is also concerned that she is not receiving updates from outpatient dialysis center when patient has hypotension or other difficulties.   We discussed possible resources and addition of home health services.  They have family friends who are willing to help twice a week with bathing and personal care.  She would feel better with nursing visits to monitor patient's health and wound.  We discussed MOST form and decisions including CODE STATUS.  She shares that this was recently completed with outpatient palliative.  Patient would want to be on the ventilator for a maximum of 1 to 2 weeks.  She is open to full scope treatment.  I shared my worry that if she were to go on the ventilator, it would likely be very difficult for her to ever be extubated due to all of her comorbidities and debility.  Knowing this she would still like to try once.   The difference between aggressive medical intervention and comfort care was considered in light of the patient's goals of care. Hospice and Palliative Care services outpatient were explained and offered.   Discussed the importance of continued conversation with family and the medical providers regarding overall plan of care and treatment options, ensuring decisions are within the context of the patient's values and GOCs.   Questions and concerns were addressed.  The family was  encouraged to call with questions or concerns.  PMT will continue to support holistically.     SUMMARY OF RECOMMENDATIONS   -Full code/full scope of treatment -Patient is not ready for end-of-life or hospice at this time, agreeable to continuing palliative care -MOST form was discussed, will benefit from completion during admission -[name] is hopeful for patient to work with PT and possibly be set up with home health at discharge.  She would never put her mother in a SNF -It is very important for patient's daughter to have good communication and updates -Psychosocial and emotional support provided -PMT will continue to follow  Prognosis:  Guarded  Discharge Planning: To Be Determined      Primary Diagnoses: Present on Admission:  Sepsis [facility])   Physical Exam Vitals and nursing note reviewed.  Constitutional:      General: She is not in acute distress.    Appearance: She is ill-appearing.  Cardiovascular:     Rate and Rhythm: Normal rate.  Pulmonary:     Effort: Pulmonary effort is normal.  Skin:    General: Skin is warm and dry.  Neurological:     Mental Status: She is alert. Mental status is at baseline.  Psychiatric:        Mood and Affect: Mood normal.    Vital Signs: BP (!) 102/53   Pulse 88   Temp 97.9 F (36.6 C) (Oral)   Resp  14   SpO2 98%  Pain Scale: 0-10   Pain Score: 7    SpO2: SpO2: 98 % O2 Device:SpO2: 98 % O2 Flow Rate: .    Palliative Assessment/Data:     MDM: High   Ellia Knowlton Johnnette Litter, PA-C  Palliative Medicine Team Team phone # [phone]  Thank you for allowing the Palliative Medicine Team to assist in the care of this patient. Please utilize secure chat with additional questions, if there is no response within 30 minutes please call the above phone number.  Palliative Medicine Team providers are available by phone from 7am to 7pm daily and can be reached through the team cell phone.  Should this patient require  assistance outside of these hours, please call the patient's attending physician.

## 2021-06-24 NOTE — ED Notes (Signed)
Per Mechele Claude, patient refusing respiratory panel.

## 2021-06-24 NOTE — Sepsis Progress Note (Addendum)
Secure Communication took place with current bedside RN who shared IV/Lab Access Difficulty: Add- In Regards to Sepsis please see Progress Notes at 12:13 per ED MD regarding H/O Hypotension, on Midodrine with Recent Adm for Fluid O/Load    2nd Lactic drawn at 0505 with labs pending

## 2021-06-24 NOTE — ED Notes (Signed)
Pt is extremely hard stick - [place] IV started my MD Bero - only one set of cultures obtained at this time

## 2021-06-24 NOTE — H&P (Addendum)
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 by mouth 2 (two) times daily for 10 days. (Patient taking differently: Take 100 mg by mouth See admin instructions. Bid x 10 days) 20 tablet 0   famotidine (PEPCID) 20 MG tablet Take 20 mg by mouth in the morning.     fluticasone (FLONASE) 50 MCG/ACT nasal spray Place 1 spray into both nostrils daily as needed for allergies or rhinitis.     HYDROmorphone (DILAUDID) 2 MG tablet Take 0.5 tablets (1 mg total) by mouth every 4 (four) hours as needed for severe pain. 30 tablet 0   levalbuterol (XOPENEX) 1.25 MG/3ML nebulizer solution Take 1.25 mg by nebulization 2 (two) times daily as needed for wheezing or shortness of breath.     metoCLOPramide (REGLAN) 5 MG tablet TAKE ONE TABLET FOUR TIMES A DAY BEFORE MEALS AND AT BEDTIME (Patient taking differently: Take 5 mg by mouth 4 (four) times daily -  before meals and at bedtime.) 40 tablet 0   midodrine (PROAMATINE) 10 MG  tablet Take 1 tablet (10 mg total) by mouth 3 (three) times daily. 90 tablet 2   morphine (MSIR) 15 MG tablet Take 15 mg by mouth every 4 (four) hours as needed for severe pain.     multivitamin (RENA-VIT) TABS tablet Take 1 tablet by mouth at bedtime.     OXYGEN Inhale 3 L into the lungs as needed (shortness of breath).     promethazine (PHENERGAN) 12.5 MG tablet Take 12.5 mg by mouth every 6 (six) hours as needed for nausea or vomiting.     SYNTHROID 175 MCG tablet Take 175 mcg by mouth daily before breakfast.     vitamin B-12 (CYANOCOBALAMIN) 500 MCG tablet Take 500 mcg by mouth daily.     metroNIDAZOLE (FLAGYL) 500 MG tablet Take 1 tablet (500 mg total) by mouth 3 (three) times daily for 10 days. (Patient not taking: Reported on 06/24/2021) 30 tablet 0    Objective: BP (!) 141/112   Pulse (!) 121   Temp 98.3 F (36.8 C) (Oral)   Resp 10   SpO2 96%  Exam: General: Alert, generally fatigued, NAD HEENT: Atraumatic, MMM, No sclera icterus CV: RRR, no murmurs, normal S1/S2 Pulm: CTAB, good WOB on RA, no crackles or wheezing Abd: Ostomy bag with no active bleed on the R abdomen, Purulent 2 2cm wound in the LLQ of the abdomen Skin: dry, warm Ext: No BLE edema, +2 Pedal and radial pulse. Neuro: Oriented x3, No focal deficit      Labs and Imaging: CBC BMET  Recent Labs  Lab 06/24/21 0039  WBC 17.2*  HGB 7.5*  HCT 26.7*  PLT 566*   Recent Labs  Lab 06/24/21 0039  NA 134*  K 4.7  CL 98  CO2 21*  BUN 31*  CREATININE 10.00*  GLUCOSE 117*  CALCIUM 8.8*      CT ABDOMEN PELVIS W CONTRAST  Result Date: 06/24/2021 CLINICAL DATA:  Left lower abdominal wound.  ESRD patient. EXAM: CT ABDOMEN AND PELVIS WITH CONTRAST TECHNIQUE: Multidetector CT imaging of the abdomen and pelvis was performed using the standard protocol following bolus administration of intravenous contrast. RADIATION DOSE REDUCTION: This exam was performed according to the departmental dose-optimization program  which includes automated exposure control, adjustment of the mA and/or kV according to patient size and/or use of iterative reconstruction technique. CONTRAST:  123m OMNIPAQUE IOHEXOL 300 MG/ML  SOLN COMPARISON:  CT without  contrast 05/22/2021, CT with contrast 03/20/2020 FINDINGS: Lower chest: There are small pleural effusions, smaller than previously. There is scattered linear scar-like opacity in the lung bases. Mild cardiomegaly is again noted with three-vessel coronary artery calcifications and calcifications in the posterior mitral ring. The tip of an infusion or dialysis catheter is noted in the upper right atrium. Hepatobiliary: 17 cm in length with homogeneous enhancement. Gallbladder is absent. There is chronic postcholecystectomy common bile duct prominence at 8.6 mm. Pancreas: No focal abnormality or ductal dilatation. Spleen: Normal in size with homogeneous enhancement. Adrenals/Urinary Tract: Surgically absent right kidney. There is no adrenal mass, no mass enhancement in the left kidney. There is a small cyst in the anterior left kidney. There is cortical thinning and small size of the left kidney. There is no stone or hydronephrosis. 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 There is no hydronephrosis or stone. There is no bladder thickening with chronic pelvic floor laxity and a small cystocele again shown. Stomach/Bowel: There are thickened folds in the stomach but no more than previously. Probable chronic gastritis. 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 The appendix is absent. The large intestine is predominantly contracted. There are no findings of colitis, diverticulitis or obstruction. Vascular/Lymphatic: Moderate to heavy aortoiliac atherosclerosis. No AAA. No adenopathy. Reproductive: Surgically absent uterus.  No adnexal mass is seen. Other: A large right lower quadrant parastomal hernia containing a portion of the mid transverse colon and multiple small  bowel segments is again shown, arising through a wall defect of 6 cm and again measuring up to 12.1 x 7.0 x 13 cm. There is trace ascites chronically in the posterior deep pelvis. The open wound of the left lower abdomen referred to in the history appears to be in the lateral left lower abdominal fatty panniculus and measures about 2 cm diameter but only 3-4 mm in depth. No underlying fluid collection is seen but there may be at least mild underlying cellulitis. There are small umbilical and inguinal fat hernias. Musculoskeletal: There is bridging enthesopathy of the lower thoracic spine. No acute or worrisome focal osseous abnormality is seen. Osteopenia. IMPRESSION: 1. There is a skin defect in the left lateral aspect of the lower abdominal wall fat panniculus measuring 2 cm in diameter but only 3-4 mm in depth. 2. There may be at least mild underlying cellulitis but there is no underlying abscess. 3. Pelvic floor laxity with cystocele, chronic. 4. 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 5. 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 6. Small pleural effusions smaller than previously. 7. 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 8. Remaining findings described above. Electronically Signed   By: Telford Nab M.D.   On: 06/24/2021 01:43   DG Chest Port 1 View  Result Date: 06/23/2021 CLINICAL DATA:  Questionable sepsis - evaluate for abnormality EXAM: PORTABLE CHEST 1 VIEW COMPARISON:  05/22/2021 FINDINGS: Left dialysis catheter remains in place, unchanged. Loop recorder device projects over the left chest. Cardiomegaly. No confluent opacities, effusions or edema. No acute bony abnormality. Aortic atherosclerosis. IMPRESSION: Cardiomegaly.  No active disease. Electronically Signed   By: Rolm Baptise M.D.   On: 06/23/2021 23:40    EKG: Sinus tachycardia with non specific ST change (I personally reviewed and  interpreted the EKG)  [name], MD 06/24/2021, 2:27 AM PGY-1, [facility] pager: [phone], text pages welcome

## 2021-06-24 NOTE — ED Notes (Signed)
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 [place], [place] [address]   Lactic acid, plasma     Status: Abnormal   Collection Time: 06/24/21  8:04 AM  Result Value Ref Range   Lactic Acid, Venous 2.5 (HH) 0.5 - 1.9 mmol/L    Comment: CRITICAL VALUE NOTED.  VALUE IS CONSISTENT WITH PREVIOUSLY REPORTED AND CALLED VALUE. Performed at [facility], [place] [address] [place], [place] [address]   Hepatitis B surface antigen     Status: None   Collection Time: 06/24/21  1:10 PM  Result Value Ref Range   Hepatitis B Surface Ag NON REACTIVE NON REACTIVE    Comment: Performed at [facility] [address] [place], [place] [address]  Hepatitis B surface antibody     Status: None   Collection Time:  06/24/21  1:10 PM  Result Value Ref Range   Hep B S Ab NON REACTIVE NON REACTIVE    Comment: (NOTE) Inconsistent with immunity, less than 10 mIU/mL.  Performed at [facility], [place] [address], [place], [place] [address]   Hemoglobin and hematocrit, blood     Status: Abnormal   Collection Time: 06/24/21  1:10 PM  Result Value Ref Range   Hemoglobin 8.0 (L) 12.0 - 15.0 g/dL    Comment: POST TRANSFUSION SPECIMEN  HCT 29.3 (L) 36.0 - 46.0 %    Comment: Performed at [facility], [place] [address], [place],  [address]   CT ABDOMEN PELVIS W CONTRAST  Result Date: 06/24/2021 CLINICAL DATA:  Left lower abdominal wound.  ESRD patient. EXAM: CT ABDOMEN AND PELVIS WITH CONTRAST TECHNIQUE: Multidetector CT imaging of the abdomen and pelvis was performed using the standard protocol following bolus administration of intravenous contrast. RADIATION DOSE REDUCTION: This exam was performed according to the departmental dose-optimization program which includes automated exposure control, adjustment of the mA and/or kV according to patient size and/or use of iterative reconstruction technique. CONTRAST:  110m OMNIPAQUE IOHEXOL 300 MG/ML  SOLN COMPARISON:  CT without contrast 05/22/2021, CT with contrast 03/20/2020 FINDINGS: Lower chest: There are small pleural effusions, smaller than previously. There is scattered linear scar-like opacity in the lung bases. Mild cardiomegaly is again noted with three-vessel coronary artery calcifications and calcifications in the posterior mitral ring. The tip of an infusion or dialysis catheter is noted in the upper right atrium. Hepatobiliary: 17 cm in length with homogeneous enhancement. Gallbladder is absent. There is chronic postcholecystectomy common bile duct prominence at 8.6 mm. Pancreas: No focal abnormality or ductal dilatation. Spleen: Normal in size with homogeneous enhancement. Adrenals/Urinary Tract: Surgically absent right kidney. There is no  adrenal mass, no mass enhancement in the left kidney. There is a small cyst in the anterior left kidney. There is cortical thinning and small size of the left kidney. There is no stone or hydronephrosis. 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 There is no hydronephrosis or stone. There is no bladder thickening with chronic pelvic floor laxity and a small cystocele again shown. Stomach/Bowel: There are thickened folds in the stomach but no more than previously. Probable chronic gastritis. 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 The appendix is absent. The large intestine is predominantly contracted. There are no findings of colitis, diverticulitis or obstruction. Vascular/Lymphatic: Moderate to heavy aortoiliac atherosclerosis. No AAA. No adenopathy. Reproductive: Surgically absent uterus.  No adnexal mass is seen. Other: A large right lower quadrant parastomal hernia containing a portion of the mid transverse colon and multiple small bowel segments is again shown, arising through a wall defect of 6 cm and again measuring up to 12.1 x 7.0 x 13 cm. There is trace ascites chronically in the posterior deep pelvis. The open wound of the left lower abdomen referred to in the history appears to be in the lateral left lower abdominal fatty panniculus and measures about 2 cm diameter but only 3-4 mm in depth. No underlying fluid collection is seen but there may be at least mild underlying cellulitis. There are small umbilical and inguinal fat hernias. Musculoskeletal: There is bridging enthesopathy of the lower thoracic spine. No acute or worrisome focal osseous abnormality is seen. Osteopenia. IMPRESSION: 1. There is a skin defect in the left lateral aspect of the lower abdominal wall fat panniculus measuring 2 cm in diameter but only 3-4 mm in depth. 2. There may be at least mild underlying cellulitis but there is no underlying abscess. 3. Pelvic floor laxity with cystocele, chronic. 4.  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 5. 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 6. Small pleural effusions smaller than previously. 7. 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 8. Remaining findings described above. Electronically Signed   By: KTelford NabM.D.   On: 06/24/2021 01:43  DG Chest Port 1 View  Result Date: 06/23/2021 CLINICAL DATA:  Questionable sepsis - evaluate for abnormality EXAM: PORTABLE CHEST 1 VIEW COMPARISON:  05/22/2021 FINDINGS: Left dialysis catheter remains in place, unchanged. Loop recorder device projects over the left chest. Cardiomegaly. No confluent opacities, effusions or edema. No acute bony abnormality. Aortic atherosclerosis. IMPRESSION: Cardiomegaly.  No active disease. Electronically Signed   By: Rolm Baptise M.D.   On: 06/23/2021 23:40    Pending Labs Unresulted Labs (From admission, onward)     Start     Ordered   06/24/21 1018  Phosphorus  Add-on,   AD        06/24/21 1018   06/24/21 0820  Hepatitis B surface antibody,quantitative  (New Admission Hemo Labs (Hepatitis B))  Once,   R        06/24/21 0819   06/24/21 0433  Lactic acid, plasma  STAT Now then every 3 hours,   R      06/24/21 0433   06/23/21 2316  Blood Culture (routine x 2)  (Septic presentation on arrival (screening labs, nursing and treatment orders for obvious sepsis))  BLOOD CULTURE X 2,   STAT      06/23/21 2316            Vitals/Pain Today's Vitals   06/24/21 1200 06/24/21 1230 06/24/21 1300 06/24/21 1420  BP: 93/71 (!) 89/46 (!) 78/62 (!) 102/53  Pulse: (!) 111 (!) 109 98 88  Resp: '19 13 15 14  '$ Temp:      TempSrc:      SpO2: 100% 96%  98%  PainSc:        Isolation Precautions No active isolations  Medications Medications  acetaminophen (TYLENOL) tablet 250 mg (has no administration in time range)  amiodarone (PACERONE) tablet 200 mg (200 mg Oral Given  06/24/21 0854)  midodrine (PROAMATINE) tablet 10 mg (10 mg Oral Given 06/24/21 1234)  levothyroxine (SYNTHROID) tablet 175 mcg (175 mcg Oral Given 06/24/21 0629)  metoCLOPramide (REGLAN) tablet 5 mg (5 mg Oral Given 06/24/21 1234)  vitamin B-12 (CYANOCOBALAMIN) tablet 500 mcg (500 mcg Oral Given 06/24/21 0853)  albuterol (PROVENTIL) (2.5 MG/3ML) 0.083% nebulizer solution 3 mL (has no administration in time range)  acetaminophen (TYLENOL) tablet 650 mg (has no administration in time range)    Or  acetaminophen (TYLENOL) suppository 650 mg (has no administration in time range)  clonazePAM (KLONOPIN) tablet 1 mg (has no administration in time range)  clonazePAM (KLONOPIN) tablet 0.5 mg (has no administration in time range)  0.9 %  sodium chloride infusion (Manually program via Guardrails IV Fluids) ( Intravenous Not Given 06/24/21 1259)  Chlorhexidine Gluconate Cloth 2 % PADS 6 each (has no administration in time range)  calcitRIOL (ROCALTROL) capsule 0.5 mcg (has no administration in time range)  Darbepoetin Alfa (ARANESP) injection 200 mcg (has no administration in time range)  ceFEPIme (MAXIPIME) 1 g in sodium chloride 0.9 % 100 mL IVPB (0 g Intravenous Stopped 06/24/21 1222)  insulin aspart (novoLOG) injection 0-6 Units (0 Units Subcutaneous Not Given 06/24/21 1143)  famotidine (PEPCID) tablet 10 mg (has no administration in time range)  DAPTOmycin (CUBICIN) 400 mg in sodium chloride 0.9 % IVPB (has no administration in time range)  lactated ringers bolus 500 mL (0 mLs Intravenous Stopped 06/24/21 0233)  ceFEPIme (MAXIPIME) 2 g in sodium chloride 0.9 % 100 mL IVPB (0 g Intravenous Stopped 06/24/21 0123)  metroNIDAZOLE (FLAGYL) IVPB 500 mg (0 mg Intravenous Stopped 06/24/21 0309)  linezolid (ZYVOX)  IVPB 600 mg (0 mg Intravenous Stopped 06/24/21 0456)  iohexol (OMNIPAQUE) 300 MG/ML solution 100 mL (100 mLs Intravenous Contrast Given 06/24/21 0110)  DAPTOmycin (CUBICIN) 550 mg in sodium chloride 0.9 % IVPB  (0 mg Intravenous Stopped 06/24/21 1419)  sodium chloride 0.9 % bolus 250 mL (250 mLs Intravenous New Bag/Given 06/24/21 1420)    Mobility walks with device High fall risk   Focused Assessments Cardiac Assessment Handoff:    Lab Results  Component Value Date   CKTOTAL 18 (L) 06/21/2020   TROPONINI <0.03 11/22/2016   Lab Results  Component Value Date   DDIMER <0.27 08/02/2013   Does the Patient currently have chest pain? No    R Recommendations: See Admitting Provider Note  Report given to:   Additional Notes:

## 2021-06-24 NOTE — Consult Note (Signed)
WOC Nurse Consult Note: Patient receiving care in [facility] ED17. Daughter at bedside, Dr. Danne Harbor also at bedside. Reason for Consult: LLQ abdominal wounds, started about 3 - 4 weeks ago as small "bumps" Wound type: unclear etiology. ?pyoderma gangrenosum, ?cutaneous presentation of former colon cancer, ?some type of infectious process Pressure Injury POA: Yes/No/NA Measurement: see photos.  Wound bed: slough present to all areas. Hardened tissue extending out approximately 4 cm along the lateral superior border. Above this hardened area there are two distinct hardened areas along the left side. No cutaneous ulcerations at these areas at this time Drainage (amount, consistency, odor) serosanginous on existing dressing. Periwound: slightly erythematous Dressing procedure/placement/frequency: Cleanse wounds on left lower abdomen with soap and water, pat dry. Place Aquacel Advantage Kellie Simmering [phone]) over the wound, then an ABD pad. Change daily.  This topical care was performed today. I discussed with the daughter, patient, and Dr. Earley Favor that if this does represent pyoderma gangrenosum that is an inflammatory condition that is best treated with steroids. It can be determined by a DERMATOLOGIC pathologist by biopsy.  These areas are very TTP. I also suggested that we might consider consulting surgery for their input on these areas.  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 The ulcerative lesion that was located along the medial aspect of the stoma is completely resolved. Treatment options for stomal/peristomal skin: barrier ring Output: thin green effluent Ostomy pouching: 2pc. CONVEX precut skin barrier (which is not on our formulary) and a high output pouch.  The daughter agreed to allow me to use one of their convex skin barriers and one of their high output pouches Education  provided: How the barrier ring compares to paste, which the patient has used in the past and we do not have on our formulary.  The patient will use Roselee Culver #[id number], skin barrier, Kellie Simmering #644, and barrier ring, Kellie Simmering [phone] while in hospital.  The daughter understands I cannot get the pre-cut convex skin barrier, but that we can provide the other components.  Monitor the wound area(s) for worsening of condition such as: Signs/symptoms of infection,  Increase in size,  Development of or worsening of odor, Development of pain, or increased pain at the affected locations.  Notify the medical team if any of these develop.  Thank you for the consult.  Discussed plan of care with the patient and bedside nurse.  [facility] nurse will not follow at this time.  Please re-consult the [facility] team if needed.  Val Riles, RN, MSN, CWOCN, CNS-BC, pager [phone]

## 2021-06-24 NOTE — Progress Notes (Signed)
Pharmacy Antibiotic Note  [name] is a 81 y.o. female admitted on 06/23/2021 with  sepsis due to an abdominal wound .  Pharmacy has been consulted for daptomycin dosing.  Patient presented with hypotension and intermittent confusion over previous few days, along with a worsening abdominal wound. She had been prescribed doxycycline and metronidazole for this outpatient.   WBC elevated at 17.2>15.7. Lactate 3.6>2.5. Patient remains afebrile. Of note, patient is on HD TThS for HD and missed her last session.   Team will order daptomycin (curbsided ID) and cefepime and consult surgery to assess need for I&D.    Plan: Daptomycin '6mg'$ /kg IV q48h (using AdjBW for BMI >30 starting 6/1) Cefepime 1g IV q24h per MD Check CK at baseline then weekly  Monitor labs, vitals, and cultures    Temp (24hrs), Avg:98.2 F (36.8 C), Min:97.8 F (36.6 C), Max:98.3 F (36.8 C)  Recent Labs  Lab 06/24/21 0039 06/24/21 0505 06/24/21 0804  WBC 17.2* 15.7*  --   CREATININE 10.00* 9.36*  --   LATICACIDVEN 3.6* 2.6* 2.5*    Estimated Creatinine Clearance: 5.1 mL/min (A) (by C-G formula based on SCr of 9.36 mg/dL (H)).    Allergies  Allergen Reactions   Adhesive [Tape] Itching, Swelling, Rash and Other (See Comments)    Tears skin and causes blisters also. EKG pads will cause welts   Avelox [Moxifloxacin] Swelling and Rash   Banana Anaphylaxis and Other (See Comments)    Blisters appear also   Blueberry Flavor Anaphylaxis   Cantaloupe Extract Allergy Skin Test Anaphylaxis and Other (See Comments)    Blisters appear also   Cefprozil Shortness Of Breath, Rash and Other (See Comments)    Tolerated ceftriaxone on 06/20/20   Cetacaine [Butamben-Tetracaine-Benzocaine] Nausea And Vomiting and Swelling   Dicyclomine Nausea And Vomiting and Other (See Comments)    "Heart trouble"; Headaches and increased blood sugars   Food Anaphylaxis and Other (See Comments)    Melons- throat closes and blisters  appear   Imdur [Isosorbide Nitrate] Hives, Palpitations, Other (See Comments) and Rash    Headaches also   Januvia [Sitagliptin] Shortness Of Breath   Lipitor [Atorvastatin] Shortness Of Breath   Losartan Potassium Shortness Of Breath   Nitroglycerin Other (See Comments)    Caused cardiac arrest and feels like skin bring torn off back of head    Omeprazole Shortness Of Breath and Swelling   Oxycodone Hives, Rash and Other (See Comments)    Tolerates Dilaudid    Penicillins Anaphylaxis    Has patient had a PCN reaction causing immediate rash, facial/tongue/throat swelling, SOB or lightheadedness with hypotension: Yes Has patient had a PCN reaction causing severe rash involving mucus membranes or skin necrosis: No Has patient had a PCN reaction that required hospitalization Yes Has patient had a PCN reaction occurring within the last 10 years: No   Prednisone Anaphylaxis   Vancomycin Anaphylaxis   Watermelon [Citrullus Vulgaris] Anaphylaxis and Other (See Comments)    Blisters appear also   Hydrocodone Hives and Other (See Comments)    Tolerates Dilaudid   Latex Rash and Other (See Comments)    Blisters    Tamiflu [Oseltamivir] Other (See Comments)    Contraindicated with other medications Patient on tikosyn, and tamiflu interfered with anti arrhythmic med Contraindicated with other medications Patient on tikosyn, and tamiflu interfered with anti arrhythmic med   Feraheme [Ferumoxytol] Other (See Comments)    Sharp pain to lower back and flank   Other Other (See  Comments)    Hydrogen - unknown- patient does not recall this (??)   Lasix [Furosemide] Hives, Swelling and Rash   Mupirocin Rash    Antimicrobials this admission: Daptoymycin 5/30>> Cefepime 5/30 >> Linezolid 5/30 x1 Flagyl 5/30 x1  Microbiology results: 5/30 BCx: sent  Thank you for allowing pharmacy to be a part of this patient's care.  Donald Pore 06/24/2021 10:40 AM

## 2021-06-24 NOTE — Progress Notes (Signed)
PT Cancellation Note  Patient Details Name: [name] MRN: [id number] DOB: 1940-06-12   Cancelled Treatment:    Reason Eval/Treat Not Completed: Medical issues which prohibited therapy Per RN, pt not medically appropriate given hypotension and low hgb. Will follow up as pt appropriate and as schedule allows.   Lou Miner, DPT  Acute Rehabilitation Services  Office: [phone]    Rudean Hitt 06/24/2021, 9:56 AM

## 2021-06-24 NOTE — Chronic Care Management (AMB) (Signed)
  Care Management   Social Work Visit Note  06/24/2021 Name: [name] MRN: [id number] DOB: [birth year]  [name] is a 81 y.o. year old female who sees [name], MD for primary care. The care management team was consulted for assistance with care management and care coordination needs related to  Initial Outreach.    Patient was given the following information about care management and care coordination services today, agreed to services, and gave verbal consent: 1.care management/care coordination services include personalized support from designated clinical staff supervised by their physician, including individualized plan of care and coordination with other care providers 2. 24/7 contact phone numbers for assistance for urgent and routine care needs. 3. The patient may stop care management/care coordination services at any time by phone call to the office staff.  Engaged with patient by telephone for initial visit in response to provider referral for social work chronic care management and care coordination services.  Assessment: Review of patient history, allergies, and health status during evaluation of patient need for care management/care coordination services.    Interventions:  Patient interviewed and appropriate assessments performed Collaborated with clinical team regarding patient needs  SW successfully contacted patients daughterSkeet Simmer. Patient is currently in the hospital.  SW will follow-up with Elesa Massed, [place] regarding [facility] Palliative care to determine if visits can be increased. SW will completed PCS application once patient is discharged from the hospital.  Patient has a support system and receives additional care occasionally from a nurse and nurses daughter. The assistance is rare, due to having other commitments with work.    SDOH (Social Determinants of Health) assessments performed: Yes     Plan:  patient will work with BSW to  address needs related to [facility]. SW requested patients daughter to contact SW once discharged.   Lenor Derrick, MSW  Social Worker [facility] Care Management  [phone]

## 2021-06-24 NOTE — ED Provider Notes (Signed)
Ultrasound ED Peripheral IV (Provider)  Date/Time: 06/24/2021 12:40 AM Performed by: Maudie Flakes, MD Authorized by: Maudie Flakes, MD   Procedure details:    Indications: multiple failed IV attempts and poor IV access     Skin Prep: chlorhexidine gluconate     Location:  Right AC   Angiocath:  20 G   Bedside Ultrasound Guided: Yes     Patient tolerated procedure without complications: Yes     Dressing applied: Yes      Maudie Flakes, MD 06/24/21 0041

## 2021-06-24 NOTE — Patient Instructions (Signed)
Visit Information  Instructions: patient will work with SW to address concerns related to [facility] services and increasing [place] visits.   Patient was given the following information about care management and care coordination services today, agreed to services, and gave verbal consent: 1.care management/care coordination services include personalized support from designated clinical staff supervised by their physician, including individualized plan of care and coordination with other care providers 2. 24/7 contact phone numbers for assistance for urgent and routine care needs. 3. The patient may stop care management/care coordination services at any time by phone call to the office staff.  Patient will contact SW when discharged from [place], [facility]  Social Worker [facility] Care Management  [phone]

## 2021-06-24 NOTE — Hospital Course (Addendum)
[name] is a 81 y.o. female who was admitted to the [facility] Teaching Service at [facility] for sepsis secondary to infected abdominal wound.Marland Kitchen Hospital course is outlined below by system.    Sepsis likely 2/2 to abdominal wound  AMS  Cellulitis surrounding abdominal wound Patient presented with hypotension and intermittent confusion lasting 3-4 days PTA. Noted to have worsening purulent LLQ abdominal wound. As well, patient was taking morphine and missed a round of HD due to mental status changes. On admission she was hypotensive to 93/59 and tachycardic to the 110s. Initial labs showed elevated lactic acid of 3.4 and leukocytosis to 17.2 In the ED received IV cefepime and PO flagyl and eventually transitioned to IV Cefepime (completed 3 day course) and daptomycin (completed 7 dose course) for MRSA coverage. Wound care team was consulted for wound and ostomy management. Prior to discharge, patient with resolved WBC, stable vital signs, and baseline mentation.   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 Nephrology was consulted who managed her dialysis while admitted. Patient completed 4 rounds of HD this admission and was back on her regular schedule by the time of discharge.   Anemia of chronic kidney disease Hgb on admission 7.5  with MCV of 80.7, baseline Hgb is 9.3. Recheck decreased to 6.1, and she was transfused 1u pRBCs under the guidance of nephro managing fluid status. Post-transfusion Hgb 8.0, then dec to 6.5 by AM. She was transfused an additional unit and maintained Hgb 8.4-8.8 since. Threshold for transfusion was set at < 8. Well known history of ESRD but we thought her anemia was likely compounded by reported recent bleeding around the colostomy bag. GI was consulted who performed upper endoscopy and colonoscopy ***   Discharge recommendations for follow up: Avoid  morphine in this patient due to ESRD on HD.

## 2021-06-25 ENCOUNTER — Ambulatory Visit: Payer: Medicare HMO | Admitting: Student

## 2021-06-25 DIAGNOSIS — Z515 Encounter for palliative care: Secondary | ICD-10-CM

## 2021-06-25 DIAGNOSIS — A419 Sepsis, unspecified organism: Secondary | ICD-10-CM | POA: Diagnosis not present

## 2021-06-25 DIAGNOSIS — Z7189 Other specified counseling: Secondary | ICD-10-CM | POA: Diagnosis not present

## 2021-06-25 LAB — BLOOD CULTURE ID PANEL (REFLEXED) - BCID2

## 2021-06-25 LAB — GLUCOSE, CAPILLARY
Glucose-Capillary: 128 mg/dL — ABNORMAL HIGH (ref 70–99)
Glucose-Capillary: 88 mg/dL (ref 70–99)
Glucose-Capillary: 88 mg/dL (ref 70–99)
Glucose-Capillary: 102 mg/dL — ABNORMAL HIGH (ref 70–99)
Glucose-Capillary: 110 mg/dL — ABNORMAL HIGH (ref 70–99)

## 2021-06-25 LAB — CBC WITH DIFFERENTIAL/PLATELET
Abs Immature Granulocytes: 0.08 10*3/uL — ABNORMAL HIGH (ref 0.00–0.07)
Basophils Absolute: 0 10*3/uL (ref 0.0–0.1)
Basophils Relative: 0 %
Eosinophils Absolute: 0 10*3/uL (ref 0.0–0.5)
Eosinophils Relative: 0 %
HCT: 22.5 % — ABNORMAL LOW (ref 36.0–46.0)
Hemoglobin: 6.5 g/dL — CL (ref 12.0–15.0)
Immature Granulocytes: 1 %
Lymphocytes Relative: 11 %
Lymphs Abs: 1.2 10*3/uL (ref 0.7–4.0)
MCH: 22.9 pg — ABNORMAL LOW (ref 26.0–34.0)
MCHC: 28.9 g/dL — ABNORMAL LOW (ref 30.0–36.0)
MCV: 79.2 fL — ABNORMAL LOW (ref 80.0–100.0)
Monocytes Absolute: 1.3 10*3/uL — ABNORMAL HIGH (ref 0.1–1.0)
Monocytes Relative: 11 %
Neutro Abs: 9.1 10*3/uL — ABNORMAL HIGH (ref 1.7–7.7)
Neutrophils Relative %: 77 %
Platelets: 335 10*3/uL (ref 150–400)
RBC: 2.84 MIL/uL — ABNORMAL LOW (ref 3.87–5.11)
RDW: 18.2 % — ABNORMAL HIGH (ref 11.5–15.5)
WBC: 11.8 10*3/uL — ABNORMAL HIGH (ref 4.0–10.5)
nRBC: 0 % (ref 0.0–0.2)

## 2021-06-25 LAB — HEPATITIS B SURFACE ANTIBODY, QUANTITATIVE: Hep B S AB Quant (Post): <3.1 m[IU]/mL — ABNORMAL LOW (ref >9.9)

## 2021-06-25 LAB — BASIC METABOLIC PANEL
Anion gap: 9 (ref 5–15)
BUN: 11 mg/dL (ref 8–23)
CO2: 26 mmol/L (ref 22–32)
Calcium: 7.9 mg/dL — ABNORMAL LOW (ref 8.9–10.3)
Chloride: 101 mmol/L (ref 98–111)
Creatinine, Ser: 4.22 mg/dL — ABNORMAL HIGH (ref 0.44–1.00)
GFR, Estimated: 10 mL/min — ABNORMAL LOW (ref >60)
Glucose, Bld: 115 mg/dL — ABNORMAL HIGH (ref 70–99)
Potassium: 3.2 mmol/L — ABNORMAL LOW (ref 3.5–5.1)
Sodium: 136 mmol/L (ref 135–145)

## 2021-06-25 LAB — HEMOGLOBIN AND HEMATOCRIT, BLOOD
HCT: 27.4 % — ABNORMAL LOW (ref 36.0–46.0)
Hemoglobin: 8.5 g/dL — ABNORMAL LOW (ref 12.0–15.0)

## 2021-06-25 LAB — PREPARE RBC (CROSSMATCH)

## 2021-06-25 LAB — LACTIC ACID, PLASMA: Lactic Acid, Venous: 1.8 mmol/L (ref 0.5–1.9)

## 2021-06-25 MED ORDER — CHLORHEXIDINE GLUCONATE CLOTH 2 % EX PADS
6.0000 | MEDICATED_PAD | Freq: Every day | CUTANEOUS | Status: DC
  Administered 2021-06-26: 6 via TOPICAL

## 2021-06-25 MED ORDER — SODIUM CHLORIDE 0.9% IV SOLUTION: Freq: Once | INTRAVENOUS | Status: DC

## 2021-06-25 MED ORDER — LORAZEPAM 2 MG/ML IJ SOLN
INTRAMUSCULAR | Status: AC
  Administered 2021-06-26: 1 mg via INTRAVENOUS
  Filled 2021-06-25: qty 1

## 2021-06-25 MED ORDER — POTASSIUM CHLORIDE 20 MEQ PO PACK: 40.0000 meq | PACK | Freq: Two times a day (BID) | ORAL | Status: DC

## 2021-06-25 MED ORDER — SODIUM CHLORIDE 0.9 % IV BOLUS
250.0000 mL | Freq: Once | INTRAVENOUS | Status: AC
  Administered 2021-06-25: 250 mL via INTRAVENOUS

## 2021-06-25 MED ORDER — SODIUM CHLORIDE 0.9 % IV BOLUS
250.0000 mL | Freq: Once | INTRAVENOUS | Status: AC
Start: 2021-06-25 — End: 2021-06-25
  Administered 2021-06-25: 250 mL via INTRAVENOUS

## 2021-06-25 NOTE — Progress Notes (Signed)
PHARMACY - PHYSICIAN COMMUNICATION CRITICAL VALUE ALERT - BLOOD CULTURE IDENTIFICATION (BCID)  [name] is an 81 y.o. female who presented to [facility] on 06/23/2021 with sepsis due to an abdominal wound   Assessment:  1/4 blood culture positive for Staphylococcus epidermidis with Methicillin resistance mecA/C - likely contaminant, but ID is peripherally monitoring   Name of physician (or Provider) Contacted: Roel Cluck and Hal Hope  Current antibiotics: Daptomycin and cefepime  Changes to prescribed antibiotics recommended:  Patient is on recommended antibiotics - No changes needed  Results for orders placed or performed during the hospital encounter of 06/23/21  Blood Culture ID Panel (Reflexed) (Collected: 06/24/2021 12:34 AM)  Result Value Ref Range   Enterococcus faecalis NOT DETECTED NOT DETECTED   Enterococcus Faecium NOT DETECTED NOT DETECTED   Listeria monocytogenes NOT DETECTED NOT DETECTED   Staphylococcus species DETECTED (A) NOT DETECTED   Staphylococcus aureus (BCID) NOT DETECTED NOT DETECTED   Staphylococcus epidermidis DETECTED (A) NOT DETECTED   Staphylococcus lugdunensis NOT DETECTED NOT DETECTED   Streptococcus species NOT DETECTED NOT DETECTED   Streptococcus agalactiae NOT DETECTED NOT DETECTED   Streptococcus pneumoniae NOT DETECTED NOT DETECTED   Streptococcus pyogenes NOT DETECTED NOT DETECTED   A.calcoaceticus-baumannii NOT DETECTED NOT DETECTED   Bacteroides fragilis NOT DETECTED NOT DETECTED   Enterobacterales NOT DETECTED NOT DETECTED   Enterobacter cloacae complex NOT DETECTED NOT DETECTED   Escherichia coli NOT DETECTED NOT DETECTED   Klebsiella aerogenes NOT DETECTED NOT DETECTED   Klebsiella oxytoca NOT DETECTED NOT DETECTED   Klebsiella pneumoniae NOT DETECTED NOT DETECTED   Proteus species NOT DETECTED NOT DETECTED   Salmonella species NOT DETECTED NOT DETECTED   Serratia marcescens NOT DETECTED NOT DETECTED   Haemophilus influenzae NOT  DETECTED NOT DETECTED   Neisseria meningitidis NOT DETECTED NOT DETECTED   Pseudomonas aeruginosa NOT DETECTED NOT DETECTED   Stenotrophomonas maltophilia NOT DETECTED NOT DETECTED   Candida albicans NOT DETECTED NOT DETECTED   Candida auris NOT DETECTED NOT DETECTED   Candida glabrata NOT DETECTED NOT DETECTED   Candida krusei NOT DETECTED NOT DETECTED   Candida parapsilosis NOT DETECTED NOT DETECTED   Candida tropicalis NOT DETECTED NOT DETECTED   Cryptococcus neoformans/gattii NOT DETECTED NOT DETECTED   Methicillin resistance mecA/C DETECTED (A) NOT DETECTED    Jodean Lima Tima Curet 06/25/2021  12:37 AM

## 2021-06-25 NOTE — TOC Initial Note (Signed)
Transition of Care [facility]) - Initial/Assessment Note    Patient Details  Name: [name] MRN: [id number] Date of Birth: [birth year]  [facility] CM/SW Contact:    Cyndi Bender, RN Phone Number: 06/25/2021, 1:33 PM  Clinical Narrative:                 Spoke to patient's daughter, [name] regarding transition needs. Patient lives with her son but her daughter helps as well. Daughter requesting hospital bed and home health RN, aide.This RNCM requested orders from the MD. [name] deferred to [name] to find highly rated home health agency. [name] with [facility] accepted referral. [name] agreeable to use in house provider for hospital bed. [name] states patient's son in law takes patient to apts.  TOC will continue to follow for needs. Expected Discharge Plan: [place] Barriers to Discharge: Continued Medical Work up   Patient Goals and CMS Choice Patient states their goals for this hospitalization and ongoing recovery are:: return home CMS Medicare.gov Compare Post Acute Care list provided to:: Patient Represenative (must comment) Choice offered to / list presented to : Adult Children  Expected Discharge Plan and Services Expected Discharge Plan: [place]   Discharge Planning Services: CM Consult Post Acute Care Choice: Home Health, Durable Medical Equipment Living arrangements for the past 2 months: Single Family Home                 DME Arranged: Hospital bed DME Agency: [employer] Date DME Agency Contacted: 06/25/21 Time DME Agency Contacted: [id number] Representative spoke with at DME Agency: [place]: RN, PT, Nurse's Aide [place] Agency: [place] Date [place]: 06/25/21 Time [place]: 63 Representative spoke with at [place]: [place] Arrangements/Services Living arrangements for the past 2 months: [place] Lives with:: Adult Children Patient language and need  for interpreter reviewed:: Yes Do you feel safe going back to the place where you live?: Yes      Need for Family Participation in Patient Care: Yes (Comment) Care giver support system in place?: Yes (comment) Current home services: DME (wheelchair, walker) Criminal Activity/Legal Involvement Pertinent to Current Situation/Hospitalization: No - Comment as needed  Activities of Daily Living Home Assistive Devices/Equipment: Eyeglasses, CBG Meter, Blood pressure cuff ADL Screening (condition at time of admission) Patient's cognitive ability adequate to safely complete daily activities?: Yes Is the patient deaf or have difficulty hearing?: Yes Does the patient have difficulty seeing, even when wearing glasses/contacts?: Yes Does the patient have difficulty concentrating, remembering, or making decisions?: No Patient able to express need for assistance with ADLs?: Yes Does the patient have difficulty dressing or bathing?: No Independently performs ADLs?: No Feeding: Independent Does the patient have difficulty walking or climbing stairs?: Yes Weakness of Legs: Both Weakness of Arms/Hands: Both  Permission Sought/Granted Permission sought to share information with : Case Manager Permission granted to share information with : Yes, Verbal Permission Granted     Permission granted to share info w AGENCY: [facility]        Emotional Assessment       Orientation: : Oriented to Situation, Oriented to  Time, Oriented to Place, Oriented to Self Alcohol / Substance Use: Not Applicable Psych Involvement: No (comment)  Admission diagnosis:  Sepsis ([place]) [A41.9] Sepsis, due to unspecified organism, unspecified whether acute organ dysfunction present [facility]) [A41.9] Patient Active Problem List   Diagnosis Date Noted   Sepsis ([place]) 06/24/2021  Cellulitis of multiple sites of trunk 06/19/2021   Skin ulcer of abdominal wall ([place]) 06/18/2021   Ventral hernia 06/10/2021   Grade II diastolic dysfunction  59/56/3875   Diabetic gastroparesis ([place]) 03/20/2021   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   Pressure injury of skin 02/16/2021   On home oxygen therapy 05/07/2020   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[place]) 03/12/2020   Presence of other vascular implants and grafts 08/22/2019   ESRD on dialysis ([place]) 04/10/2019   Multiple drug allergies 04/07/2019   Gout, unspecified 04/06/2019   Other disorders of phosphorus metabolism 11/18/2018   Iron deficiency anemia, unspecified 10/13/2018   Secondary hyperparathyroidism of renal origin ([place]) 09/21/2018   Diabetic polyneuropathy ([place]) 07/31/2016   Vitamin B12 deficiency 07/31/2016   Vitamin D deficiency 07/31/2016   Controlled diabetes mellitus type 2 with complications ([place]) 64/33/2951   Hypertensive heart disease    Cancer of renal pelvis, 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   A-fib ([place]) 04/16/2014   CAD (coronary artery disease) 01/11/2013   Hyperlipidemia associated with type 2 diabetes mellitus ([place]) 01/11/2013   Hypertension associated with chronic kidney disease due to type 2 diabetes mellitus ([place])    DM neuropathy, type II diabetes mellitus ([place]) 01/09/2013   Obesity, Class III, BMI 40-49.9 (morbid obesity) ([place]) 03/05/2010   Hypothyroidism 02/07/2009   Anxiety state 02/07/2009   Asthma 02/07/2009   GASTROESOPHAGEAL REFLUX DISEASE, CHRONIC 02/07/2009   Osteoarthrosis, unspecified whether generalized or localized, unspecified site 02/07/2009   PCP:  [name], MD Pharmacy:   [place], [address] [place] [address] Phone: [phone] Fax: [place] [address] [place] [address] Phone: [phone] Fax: [phone]  [facility] [place], [address] [place] [address] Phone: [phone] Fax: [phone]     Social  Determinants of Health (SDOH) Interventions    Readmission Risk Interventions     View : No data to display.

## 2021-06-25 NOTE — Consult Note (Signed)
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 06/24/21 12:39 AM  Result Value Ref Range   WBC 17.2 (H) 4.0 - 10.5 K/uL   RBC 3.31 (L) 3.87 - 5.11 MIL/uL   Hemoglobin 7.5 (L) 12.0 - 15.0 g/dL   HCT 26.7 (L) 36.0 - 46.0 %   MCV 80.7 80.0 - 100.0 fL   MCH 22.7 (L) 26.0 - 34.0 pg   MCHC 28.1 (L) 30.0 - 36.0 g/dL   RDW 18.8 (H) 11.5 - 15.5 %   Platelets 566 (H) 150 - 400 K/uL   nRBC 0.0 0.0 - 0.2 %   Neutrophils Relative % 83 %   Neutro Abs 14.4 (H) 1.7 - 7.7 K/uL   Lymphocytes Relative 8 %   Lymphs Abs 1.4 0.7 - 4.0 K/uL   Monocytes Relative 7 %   Monocytes Absolute 1.2 (H) 0.1 - 1.0 K/uL   Eosinophils Relative 1 %   Eosinophils Absolute 0.1 0.0 - 0.5 K/uL   Basophils Relative 0 %   Basophils Absolute 0.1 0.0 - 0.1 K/uL   Immature Granulocytes 1 %   Abs Immature Granulocytes 0.11 (H) 0.00 - 0.07 K/uL    Comment: Performed at [facility] [address], [place], [place] [address]  Protime-INR     Status: Abnormal    Collection Time: 06/24/21 12:39 AM  Result Value Ref Range   Prothrombin Time 17.6 (H) 11.4 - 15.2 seconds   INR 1.5 (H) 0.8 - 1.2    Comment: (NOTE) INR goal varies based on device and disease states. Performed at [facility], [place] [address], [place], [place] [address]   APTT     Status: Abnormal   Collection Time: 06/24/21 12:39 AM  Result Value Ref Range   aPTT 50 (H) 24 - 36 seconds    Comment:        IF BASELINE aPTT IS ELEVATED, SUGGEST PATIENT RISK ASSESSMENT BE USED TO DETERMINE APPROPRIATE ANTICOAGULANT THERAPY. Performed at [facility], [place] [address], [place], [place] [address]   Troponin I (High Sensitivity)     Status: Abnormal   Collection Time: 06/24/21 12:39 AM  Result Value Ref Range   Troponin I (High Sensitivity) 28 (H) <18 ng/L    Comment: (NOTE) Elevated high sensitivity troponin I (hsTnI) values and significant  changes across serial measurements may suggest ACS but many other  chronic and acute conditions are known to elevate hsTnI results.  Refer to the "Links" section for chest pain algorithms and additional  guidance. Performed at [facility], [place] [address], [place],  [address]   Lactic acid,  plasma     Status: Abnormal   Collection Time: 06/24/21  5:05 AM  Result Value Ref Range   Lactic Acid, Venous 2.6 (HH) 0.5 - 1.9 mmol/L    Comment: CRITICAL RESULT CALLED TO, READ BACK BY AND VERIFIED WITH: Otho Perl, RN 0600 05.30.23 MRIVET Performed at [facility], [place] [address], [place], [place] [address]   Troponin I (High Sensitivity)     Status: Abnormal   Collection Time: 06/24/21  5:05 AM  Result Value Ref Range   Troponin I (High Sensitivity) 27 (H) <18 ng/L    Comment: (NOTE) Elevated high sensitivity troponin I (hsTnI) values and significant  changes across serial measurements may suggest ACS but many other  chronic and acute conditions are known to elevate hsTnI results.  Refer to the "Links" section  for chest pain algorithms and additional  guidance. Performed at [facility], [place] [address], [place], [place] [address]   Comprehensive metabolic panel     Status: Abnormal   Collection Time: 06/24/21  5:05 AM  Result Value Ref Range   Sodium 132 (L) 135 - 145 mmol/L   Potassium 4.4 3.5 - 5.1 mmol/L   Chloride 99 98 - 111 mmol/L   CO2 21 (L) 22 - 32 mmol/L   Glucose, Bld 157 (H) 70 - 99 mg/dL    Comment: Glucose reference range applies only to samples taken after fasting for at least 8 hours.   BUN 31 (H) 8 - 23 mg/dL   Creatinine, Ser 9.36 (H) 0.44 - 1.00 mg/dL   Calcium 8.1 (L) 8.9 - 10.3 mg/dL   Total Protein 4.6 (L) 6.5 - 8.1 g/dL   Albumin 1.8 (L) 3.5 - 5.0 g/dL   AST 24 15 - 41 U/L   ALT 13 0 - 44 U/L   Alkaline Phosphatase 77 38 - 126 U/L   Total Bilirubin 0.3 0.3 - 1.2 mg/dL   GFR, Estimated 4 (L) >60 mL/min    Comment: (NOTE) Calculated using the CKD-EPI Creatinine Equation (2021)    Anion gap 12 5 - 15    Comment: Performed at [facility], [place] [address] [place], [place] [address]  CBC     Status: Abnormal   Collection Time: 06/24/21  5:05 AM  Result Value Ref Range   WBC 15.7 (H) 4.0 - 10.5 K/uL   RBC 2.74 (L) 3.87 - 5.11 MIL/uL   Hemoglobin 6.1 (LL) 12.0 - 15.0 g/dL    Comment: REPEATED TO VERIFY THIS CRITICAL RESULT HAS VERIFIED AND BEEN CALLED TO R. SMITH, RN BY ENIOLA ADEDOKUN ON 05 30 2023 AT 0536, AND HAS BEEN READ BACK.     HCT 21.9 (L) 36.0 - 46.0 %   MCV 79.9 (L) 80.0 - 100.0 fL   MCH 22.3 (L) 26.0 - 34.0 pg   MCHC 27.9 (L) 30.0 - 36.0 g/dL   RDW 18.9 (H) 11.5 - 15.5 %   Platelets 456 (H) 150 - 400 K/uL   nRBC 0.0 0.0 - 0.2 %    Comment: Performed at [facility] [address] [place], [place] [address]  Prepare RBC (crossmatch)     Status: None   Collection Time: 06/24/21  5:52 AM  Result Value Ref Range   Order Confirmation      ORDER PROCESSED BY BLOOD BANK Performed at [facility], [place] [address]  [place], [place] [address]   Type and screen [place]  Status: None (Preliminary result)   Collection Time: 06/24/21  6:30 AM  Result Value Ref Range   ABO/RH(D) A POS    Antibody Screen NEG    Sample Expiration 06/27/2021,2359    Unit Number [id number]    Blood Component Type RED CELLS,LR    Unit division 00    Status of Unit ISSUED,FINAL    Transfusion Status OK TO TRANSFUSE    Crossmatch Result      Compatible Performed at [facility], [place] [address] [place], [place] [address]    Unit Number [id number]    Blood Component Type RED CELLS,LR    Unit division 00    Status of Unit ISSUED    Transfusion Status OK TO TRANSFUSE    Crossmatch Result Compatible   Lactic acid, plasma     Status: Abnormal   Collection Time: 06/24/21  8:04 AM  Result Value Ref Range   Lactic Acid, Venous 2.5 (HH) 0.5 - 1.9 mmol/L    Comment: CRITICAL VALUE NOTED.  VALUE IS CONSISTENT WITH PREVIOUSLY REPORTED AND CALLED VALUE. Performed at [facility], [place] [address] [place], [place] [address]   Hepatitis B surface antigen     Status: None   Collection Time: 06/24/21  1:10 PM  Result Value Ref Range   Hepatitis B Surface Ag NON REACTIVE NON REACTIVE    Comment: Performed at [facility] [address], [place], [place] [address]  Hepatitis B surface antibody     Status: None   Collection Time: 06/24/21  1:10 PM  Result Value Ref Range   Hep B S Ab NON REACTIVE NON REACTIVE    Comment: (NOTE) Inconsistent with immunity, less than 10 mIU/mL.  Performed at [facility], [place] [address] [place], [place] [address]   Hepatitis B surface antibody,quantitative     Status: Abnormal   Collection Time: 06/24/21  1:10 PM  Result Value Ref Range   Hepatitis B-Post <3.1 (L) Immunity>9.9 mIU/mL    Comment: (NOTE)  Status of Immunity                     Anti-HBs Level  ------------------                     -------------- Inconsistent with Immunity                    0.0 - 9.9 Consistent with Immunity                          >9.9 Performed At: [facility] [place], [place] [id number] Rush Farmer MD SW:[id number]   Phosphorus     Status: Abnormal   Collection Time: 06/24/21  1:10 PM  Result Value Ref Range   Phosphorus 8.1 (H) 2.5 - 4.6 mg/dL    Comment: Performed at [facility] [address], [place], [place] [address]  Hemoglobin and hematocrit, blood     Status: Abnormal   Collection Time: 06/24/21  1:10 PM  Result Value Ref Range   Hemoglobin 8.0 (L) 12.0 - 15.0 g/dL    Comment: POST TRANSFUSION SPECIMEN   HCT 29.3 (L) 36.0 - 46.0 %    Comment: Performed at [facility] [address], [place], [place] [address]  Blood Culture (routine x 2)     Status: None (Preliminary result)   Collection Time: 06/24/21  4:00 PM   Specimen: BLOOD RIGHT WRIST  Result Value Ref Range   Specimen Description BLOOD RIGHT WRIST    Special Requests      BOTTLES DRAWN AEROBIC AND ANAEROBIC Blood Culture adequate volume   Culture      NO GROWTH < 24 HOURS Performed at [facility], [place] [address], [place], [place] [address]    Report Status PENDING   Lactic acid, plasma     Status: Abnormal   Collection Time: 06/24/21  4:00 PM  Result Value Ref Range   Lactic Acid, Venous 2.6 (HH) 0.5 - 1.9 mmol/L    Comment: CRITICAL VALUE NOTED.  VALUE IS CONSISTENT WITH PREVIOUSLY REPORTED AND CALLED VALUE. Performed at [facility], [place] [address], [place], [place] [address]   Glucose, capillary     Status: Abnormal   Collection Time: 06/24/21  4:46 PM  Result Value Ref Range   Glucose-Capillary 116 (H) 70 - 99 mg/dL    Comment: Glucose reference range applies only to samples taken after fasting for at least 8 hours.  Glucose, capillary     Status: Abnormal   Collection Time: 06/25/21  2:37 AM  Result Value Ref Range   Glucose-Capillary 128 (H) 70 - 99 mg/dL    Comment: Glucose reference range applies  only to samples taken after fasting for at least 8 hours.  Lactic acid, plasma     Status: None   Collection Time: 06/25/21  4:27 AM  Result Value Ref Range   Lactic Acid, Venous 1.8 0.5 - 1.9 mmol/L    Comment: Performed at [facility] [address], [place], [place] [address]  CBC with Differential/Platelet     Status: Abnormal   Collection Time: 06/25/21  4:27 AM  Result Value Ref Range   WBC 11.8 (H) 4.0 - 10.5 K/uL   RBC 2.84 (L) 3.87 - 5.11 MIL/uL   Hemoglobin 6.5 (LL) 12.0 - 15.0 g/dL    Comment: This critical result has verified and been called to B IGEL RN by [facility] HAYES on 05 31 2023 at 0446, and has been read back.    HCT 22.5 (L) 36.0 - 46.0 %   MCV 79.2 (L) 80.0 - 100.0 fL   MCH 22.9 (L) 26.0 - 34.0 pg   MCHC 28.9 (L) 30.0 - 36.0 g/dL   RDW 18.2 (H) 11.5 - 15.5 %   Platelets 335 150 - 400 K/uL   nRBC 0.0 0.0 - 0.2 %   Neutrophils Relative % 77 %   Neutro Abs 9.1 (H) 1.7 - 7.7 K/uL   Lymphocytes Relative 11 %   Lymphs Abs 1.2 0.7 - 4.0 K/uL   Monocytes Relative 11 %   Monocytes Absolute 1.3 (H) 0.1 - 1.0 K/uL   Eosinophils Relative 0 %   Eosinophils Absolute 0.0 0.0 - 0.5 K/uL   Basophils Relative 0 %   Basophils Absolute 0.0 0.0 - 0.1 K/uL   Immature Granulocytes 1 %   Abs Immature Granulocytes 0.08 (H) 0.00 - 0.07 K/uL    Comment: Performed at [facility], [address] [place], [place] [address]  Basic metabolic panel     Status: Abnormal   Collection Time: 06/25/21  4:27 AM  Result Value Ref Range   Sodium 136 135 - 145 mmol/L   Potassium 3.2 (L) 3.5 - 5.1 mmol/L    Comment: DELTA CHECK NOTED   Chloride 101 98 - 111 mmol/L   CO2  26 22 - 32 mmol/L   Glucose, Bld 115 (H) 70 - 99 mg/dL    Comment: Glucose reference range applies only to samples taken after fasting for at least 8 hours.   BUN 11 8 - 23 mg/dL   Creatinine, Ser 4.22 (H) 0.44 - 1.00 mg/dL    Comment: DELTA CHECK NOTED   Calcium 7.9 (L) 8.9 - 10.3 mg/dL   GFR, Estimated 10  (L) >60 mL/min    Comment: (NOTE) Calculated using the CKD-EPI Creatinine Equation (2021)    Anion gap 9 5 - 15    Comment: Performed at [facility] [address] [place], [place] [address]  Prepare RBC (crossmatch)     Status: None   Collection Time: 06/25/21  5:59 AM  Result Value Ref Range   Order Confirmation      ORDER PROCESSED BY BLOOD BANK Performed at [facility], [place] [address], [place], [place] [address]   Glucose, capillary     Status: None   Collection Time: 06/25/21  7:55 AM  Result Value Ref Range   Glucose-Capillary 88 70 - 99 mg/dL    Comment: Glucose reference range applies only to samples taken after fasting for at least 8 hours.  Glucose, capillary     Status: None   Collection Time: 06/25/21 11:23 AM  Result Value Ref Range   Glucose-Capillary 88 70 - 99 mg/dL    Comment: Glucose reference range applies only to samples taken after fasting for at least 8 hours.   CT ABDOMEN PELVIS W CONTRAST  Result Date: 06/24/2021 CLINICAL DATA:  Left lower abdominal wound.  ESRD patient. EXAM: CT ABDOMEN AND PELVIS WITH CONTRAST TECHNIQUE: Multidetector CT imaging of the abdomen and pelvis was performed using the standard protocol following bolus administration of intravenous contrast. RADIATION DOSE REDUCTION: This exam was performed according to the departmental dose-optimization program which includes automated exposure control, adjustment of the mA and/or kV according to patient size and/or use of iterative reconstruction technique. CONTRAST:  161m OMNIPAQUE IOHEXOL 300 MG/ML  SOLN COMPARISON:  CT without contrast 05/22/2021, CT with contrast 03/20/2020 FINDINGS: Lower chest: There are small pleural effusions, smaller than previously. There is scattered linear scar-like opacity in the lung bases. Mild cardiomegaly is again noted with three-vessel coronary artery calcifications and calcifications in the posterior mitral ring. The tip of an infusion or dialysis  catheter is noted in the upper right atrium. Hepatobiliary: 17 cm in length with homogeneous enhancement. Gallbladder is absent. There is chronic postcholecystectomy common bile duct prominence at 8.6 mm. Pancreas: No focal abnormality or ductal dilatation. Spleen: Normal in size with homogeneous enhancement. Adrenals/Urinary Tract: Surgically absent right kidney. There is no adrenal mass, no mass enhancement in the left kidney. There is a small cyst in the anterior left kidney. There is cortical thinning and small size of the left kidney. There is no stone or hydronephrosis. 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 There is no hydronephrosis or stone. There is no bladder thickening with chronic pelvic floor laxity and a small cystocele again shown. Stomach/Bowel: There are thickened folds in the stomach but no more than previously. Probable chronic gastritis. 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 The appendix is absent. The large intestine is predominantly contracted. There are no findings of colitis, diverticulitis or obstruction. Vascular/Lymphatic: Moderate to heavy aortoiliac atherosclerosis. No AAA. No adenopathy. Reproductive: Surgically absent uterus.  No adnexal mass is seen. Other: A large right lower quadrant  parastomal hernia containing a portion of the mid transverse colon and multiple small bowel segments is again shown, arising through a wall defect of 6 cm and again measuring up to 12.1 x 7.0 x 13 cm. There is trace ascites chronically in the posterior deep pelvis. The open wound of the left lower abdomen referred to in the history appears to be in the lateral left lower abdominal fatty panniculus and measures about 2 cm diameter but only 3-4 mm in depth. No underlying fluid collection is seen but there may be at least mild underlying cellulitis. There are small umbilical and inguinal fat hernias. Musculoskeletal: There is bridging enthesopathy of the lower  thoracic spine. No acute or worrisome focal osseous abnormality is seen. Osteopenia. IMPRESSION: 1. There is a skin defect in the left lateral aspect of the lower abdominal wall fat panniculus measuring 2 cm in diameter but only 3-4 mm in depth. 2. There may be at least mild underlying cellulitis but there is no underlying abscess. 3. Pelvic floor laxity with cystocele, chronic. 4. 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 5. 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 6. Small pleural effusions smaller than previously. 7. 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 8. Remaining findings described above. Electronically Signed   By: Telford Nab M.D.   On: 06/24/2021 01:43   DG Chest Port 1 View  Result Date: 06/23/2021 CLINICAL DATA:  Questionable sepsis - evaluate for abnormality EXAM: PORTABLE CHEST 1 VIEW COMPARISON:  05/22/2021 FINDINGS: Left dialysis catheter remains in place, unchanged. Loop recorder device projects over the left chest. Cardiomegaly. No confluent opacities, effusions or edema. No acute bony abnormality. Aortic atherosclerosis. IMPRESSION: Cardiomegaly.  No active disease. Electronically Signed   By: Rolm Baptise M.D.   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 Base appears more like fibrinous exudate than purulent material - do not think wound is likely source of AMS/sepsis - nothing to surgically debride or drain, general surgery will not follow    I reviewed CT AP and labs yesterday. Reviewed WOC note as well.    Norm Parcel,  [facility] 06/25/2021, 1:37 PM Please see Amion for pager number during day hours 7:00am-4:30pm

## 2021-06-25 NOTE — Progress Notes (Signed)
   06/25/21 1800  Assess: MEWS Score  BP (!) 77/45  MAP (mmHg) (!) 57  Pulse Rate (!) 58  ECG Heart Rate (!) 58  Resp 12  Level of Consciousness Alert  SpO2 97 %  Assess: MEWS Score  MEWS Temp 0  MEWS Systolic 2  MEWS Pulse 0  MEWS RR 1  MEWS LOC 0  MEWS Score 3  MEWS Score Color Yellow  Assess: if the MEWS score is Yellow or Red  Were vital signs taken at a resting state? Yes  Focused Assessment No change from prior assessment  Does the patient meet 2 or more of the SIRS criteria? No  MEWS guidelines implemented *See Row Information* Yes  Treat  MEWS Interventions Administered scheduled meds/treatments  Pain Scale 0-10  Pain Score 0  Take Vital Signs  Increase Vital Sign Frequency  Yellow: Q 2hr X 2 then Q 4hr X 2, if remains yellow, continue Q 4hrs  Escalate  MEWS: Escalate Yellow: discuss with charge nurse/RN and consider discussing with provider and RRT  Notify: Charge Nurse/RN  Name of Charge Nurse/RN Notified Erin, RN  Date Charge Nurse/RN Notified 06/25/21  Time Charge Nurse/RN Notified 1800  Notify: Provider  Provider Name/Title C. Jeani Hawking MD  Date Provider Notified 06/25/21  Time Provider Notified 1627  Method of Notification Page;Face-to-face  Notification Reason Critical result (BP low)  Provider response See new orders;Other (Comment) (bolus given)  Date of Provider Response 06/25/21  Time of Provider Response 1636  Assess: SIRS CRITERIA  SIRS Temperature  0  SIRS Pulse 0  SIRS Respirations  0  SIRS WBC 0  SIRS Score Sum  0

## 2021-06-25 NOTE — Progress Notes (Signed)
FPTS Interim Progress Note  S:Received chat message about 4:30 pm from RN regarding low BP. RN reports she had tried to page without answer. Low BP after 4pm, lowest 70s/45. Patient appears asymptomatic. RN reports patient received her AM midodrine late, skipped noon dose because it was too close. When RN noticed BP getting low, administered 5pm midodrine early to help.   I entered the room to find [name] laying supine in bed with her daughter at bedside. Denies headache, vision changes, chest pain, SOB.   O: BP (!) 77/45 (BP Location: Right Arm)   Pulse (!) 58   Temp 97.7 F (36.5 C) (Oral)   Resp 12   Ht '5\' 2"'$  (1.575 m)   SpO2 97%   BMI 35.74 kg/m   General: awake, alert, oriented to person/place/year/president Cards: irregularly irregular rhythm Lungs: CTAB, low effort Ext: no BLE edema  A/P: Hypotension Likely related to missed midodrine dose. Patient asymptomatic, A&Ox4. 250 mL NS bolus improved BP.  - continue to monitor - midodrine TID as scheduled - will bolus as little as possible given ESRD on HD  Ezequiel Essex, MD 06/25/2021, 6:33 PM PGY-2, [place] Service pager [phone]

## 2021-06-25 NOTE — Progress Notes (Signed)
Family Medicine Teaching Service Daily Progress Note Intern Pager: [phone]  Patient name: [name] Medical record number: [id number] Date of birth: [birth year] Age: 81 y.o. Gender: female  Primary Care Provider: [name], MD Consultants: Nephrology, palliative medicine Code Status: Full  Pt Overview and Major Events to Date:  5/30: Admitted  Assessment and Plan:  [name] is a 81 year old female presenting with hypotension and confusion after starting new medications and missing HD, found to be septic on admission.  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  Sepsis likely 2/2 abdominal wound Altered mental status Improvements to blood pressure overnight, most recent 92/52, but SBP 133 on room monitoring.  No longer tachycardic, and WBC improved to 11.8.  Patient appears to be responding well to antibiotic therapy.  Remains somnolent; likely multifactorial in the setting of sepsis/anemia/medications and missed HD.  General surgery consulted, and advised that there is nothing to be drained with her wounds.  Initial blood cultures with Staph epidermidis, likely skin contaminant; repeat blood cultures pending. - Continue IV cefepime and daptomycin - Per nephrology, 250 to 500 cc fluid boluses x3 as needed hypotension (given x2 thus far) --Consult ID for recommendations: still believes the S. Epidermidis to be a contaminant.   Hypotension Patient hypotensive at baseline.  With improvements since receiving fluid boluses and maintaining compliance with midodrine. - Continue home midodrine 3 times daily  Anemia of chronic disease Hgb 6.5, posttransfusion Hgb 8.0 yesterday. -Transfused 1 additional unit PRBCs today - Follow-up posttransfusion H&H - Daily CBC - Transfusion threshold <8 -GI consult to assess for source of bleeding; suspected  stoma  ESRD on HD Tuesday/Thursday/Saturday schedule.  Received HD last night. - Nephrology following; appreciate recs - Continue HD as scheduled  Lynch syndrome Hx of colon cancer s/p right hemicolectomy with ileostomy Wound care on board.  Patient with normal stool and without blood present in ileostomy bag  T2DM Diet controlled -vsSSI -CBG monitoring  PAF -Continue home amiodarone - Continue to hold home Eliquis for concerns for bleed   Chronic conditions: CAD-stable, not on any antiplatelets Anxiety-continue home Klonopin 1 mg on nondialysis days and 0.5 mg on dialysis days Hypothyroidism-continue home Synthroid 175 mcg daily GERD-Home famotidine decreased to 10 mg daily in light of renal function  FEN/GI: Renal PPx: SCDs Dispo:Pending PT recommendations  pending clinical improvement . Barriers include clinical improvement.   Subjective:  Patient seen with daughter at bedside.  Patient remains somnolent, in conversation.  She is oriented to person (name) only, stating that she is at home, and unable to offer the year nor her age.  She reports abdominal pain of 8/10.  Daughter's questions were answered, and they report no acute concerns or complaints.  Objective: Temp:  [97.5 F (36.4 C)-97.9 F (36.6 C)] 97.9 F (36.6 C) (05/31 0750) Pulse Rate:  [53-117] 53 (05/31 0750) Resp:  [10-20] 14 (05/31 0750) BP: (61-102)/(39-71) 92/52 (05/31 0750) SpO2:  [95 %-100 %] 100 % (05/31 0750) Physical Exam: General: Toxic appearing obese elderly female lying in bed, somnolent Cardiovascular: RRR, no abnormal heart sounds auscultated Respiratory: CTA x2; normal WOB on 2 L nasal cannula Abdomen: LLQ abdominal wound covered, dressing C/D/I; ileostomy bag with normal-appearing stool, and no blood present.  Dressing around stoma C/D/I. Extremities: Nonedematous BLE; decreased skin turgor  Laboratory: Recent Labs  Lab 06/24/21 0039 06/24/21 0505 06/24/21 1310 06/25/21 0427  WBC 17.2* 15.7*  --  11.8*  HGB 7.5* 6.1* 8.0* 6.5*  HCT 26.7* 21.9* 29.3* 22.5*  PLT 566* 456*  --  335   Recent Labs  Lab 06/24/21 0039 06/24/21 0505 06/25/21 0427  NA 134* 132* 136  K 4.7 4.4 3.2*  CL 98 99 101  CO2 21* 21* 26  BUN 31* 31* 11  CREATININE 10.00* 9.36* 4.22*  CALCIUM 8.8* 8.1* 7.9*  PROT 5.5* 4.6*  --   BILITOT 0.6 0.3  --   ALKPHOS 103 77  --   ALT 18 13  --   AST 27 24  --   GLUCOSE 117* 157* 115*    Lactic acid, plasma Order: [id number] Status: Final result    Visible to patient: Yes (not seen)    Next appt: Today at 01:45 PM in Family Medicine [place] Guiles, DO)    0 Result Notes           Component Ref Range & Units 04:27 (06/25/21) 1 d ago (06/24/21) 1 d ago (06/24/21) 1 d ago (06/24/21) 1 d ago (06/24/21) 3 mo ago (03/19/21) 3 mo ago (03/19/21)  Lactic Acid, Venous 0.5 - 1.9 mmol/L 1.8  2.6 High Panic  CM  2.5 High Panic  CM  2.6 High Panic  CM  3.6 High Panic  CM  2.9 High Panic  CM  3.3 High Panic  CM   Comment: Performed at [facility], [place] [address] [place], [place] [address]     Contains abnormal data Blood Culture (routine x 2) Order: [id number] Status: Final result    Visible to patient: No (scheduled for 06/25/2021  9:45 AM)    Next appt: Today at 01:45 PM in Family Medicine [place] Guiles, DO)    Specimen Information: BLOOD  0 Result Notes    Component 1 d ago  Specimen Description BLOOD SITE NOT SPECIFIED   Special Requests BOTTLES DRAWN AEROBIC AND ANAEROBIC Blood Culture adequate volume   Culture  Setup Time GRAM POSITIVE COCCI  AEROBIC BOTTLE ONLY  CRITICAL RESULT CALLED TO, READ BACK BY AND VERIFIED WITH: T RUDISILL,PHARMD'@0029'$  06/25/21 [place]   Culture  Abnormal  STAPHYLOCOCCUS EPIDERMIDIS  THE SIGNIFICANCE OF ISOLATING THIS ORGANISM FROM A SINGLE SET OF BLOOD CULTURES WHEN MULTIPLE SETS ARE DRAWN IS UNCERTAIN. PLEASE NOTIFY THE MICROBIOLOGY DEPARTMENT WITHIN ONE WEEK IF SPECIATION AND SENSITIVITIES ARE REQUIRED.   Performed at [facility], [place] [address] [place], [place] [address]    Report Status 06/25/2021 FINAL       Imaging/Diagnostic Tests: EXAM: CT ABDOMEN AND PELVIS WITH CONTRAST  IMPRESSION: 1. There is a skin defect in the left lateral aspect of the lower abdominal wall fat panniculus measuring 2 cm in diameter but only 3-4 mm in depth. 2. There may be at least mild underlying cellulitis but there is no underlying abscess. 3. Pelvic floor laxity with cystocele, chronic. 4. 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 5. 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 6. Small pleural effusions smaller than previously. 7. 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 8. Remaining findings described above.     Electronically Signed   By: Telford Nab M.D.   On: 06/24/2021 01:43  Rosezetta Schlatter, MD 06/25/2021, 9:01 AM PGY-1, [place] Intern pager: [phone], text pages welcome

## 2021-06-25 NOTE — Plan of Care (Signed)
  Problem: Clinical Measurements: Goal: Will remain free from infection 06/25/2021 2149 by Laurance Flatten, RN Outcome: Progressing 06/25/2021 2149 by Laurance Flatten, RN Outcome: Progressing Goal: Diagnostic test results will improve 06/25/2021 2149 by Laurance Flatten, RN Outcome: Progressing 06/25/2021 2149 by Laurance Flatten, RN Outcome: Progressing   Problem: Education: Goal: Knowledge of General Education information will improve Description: Including pain rating scale, medication(s)/side effects and non-pharmacologic comfort measures Outcome: Progressing

## 2021-06-25 NOTE — Progress Notes (Signed)
Subjective: Seen in room, daughter at bedside, said tolerated dialysis yesterday, the morning appears to be somewhat more alert.  Her daughter.  States abdominal pain improving.  Noted transfusion for low hemoglobin   Objective Vital signs in last 24 hours: Vitals:   06/25/21 0615 06/25/21 0630 06/25/21 0750 06/25/21 1120  BP: (!) 82/40 (!) 79/49 (!) 92/52 (!) 133/99  Pulse: (!) 58 (!) 57 (!) 53 (!) 52  Resp: '15 13 14 11  '$ Temp: 97.8 F (36.6 C) 97.8 F (36.6 C) 97.9 F (36.6 C)   TempSrc: Axillary  Oral   SpO2: 100%  100% 100%  Height:       Weight change:   Physical Exam: General: Obese pleasantly confused elderly female appears chronically ill, NAD Heart: RRR no MRG Lungs: CTA bilaterally nonlabored breathing nasal cannula oxygen Abdomen: Obese, NABS, soft left lower quadrant wound dressing dry clear ileostomy bag with stool dark stool present no blood noted Extremities: No pedal edema Dialysis Access: Left IJ TDC dressing dry clear nontender Home meds include - tylenol, albuterol, amiodarone, apixaban, clonazepam, famotidine, fluticasone, hydromorphone, levalbuterol, metoclopramide, midodrine 10 tid, morphine IR, renavit, O2 3 L, promethazine, synthroid, cyanocobalamin        OP HD: [facility] TTS  3.5h  400/1.5  87.5kg  2/2 bath TDC  Hep 2700   - mircera 225 mcg q2, last 5/18, due 6/01 - rocaltrol 0.5 mcg tiw po - last HD 5/25, 90.1 > 89.9kg     Problem/Plan: Sepsis - w/ chills, AMS at home.  Etiology thought to be abdominal wound, on admit high HR and soft BP's in ED. vitals have improved this a.m. started on broad spec IV abx. Per pmd.  ESRD - on HD TTS.  Had dialysis 5/30 and tolerated with no sig lab / vol issues.  Next HD tomorrow Thursday on schedule Hypotension/ volume - BP's 100 range, cont midodrine Anemia esrd - Hb low admit 8.0 dropped to 6.5 this a.m. for transfusion , transfuse prn. Next esa due 6/01,  order darbe 200 ug q Thursday while here.  MBD ckd - CCa in  range, add on phos. Cont po vdra.   PAF - on eliquis, SR on exam, Rx per pmd Chronic anxiety    Ernest Haber, PA-C [facility] Beeper [phone] 06/25/2021,3:17 PM  LOS: 1 day   Labs: Basic Metabolic Panel: Recent Labs  Lab 06/24/21 0039 06/24/21 0505 06/24/21 1310 06/25/21 0427  NA 134* 132*  --  136  K 4.7 4.4  --  3.2*  CL 98 99  --  101  CO2 21* 21*  --  26  GLUCOSE 117* 157*  --  115*  BUN 31* 31*  --  11  CREATININE 10.00* 9.36*  --  4.22*  CALCIUM 8.8* 8.1*  --  7.9*  PHOS  --   --  8.1*  --    Liver Function Tests: Recent Labs  Lab 06/24/21 0039 06/24/21 0505  AST 27 24  ALT 18 13  ALKPHOS 103 77  BILITOT 0.6 0.3  PROT 5.5* 4.6*  ALBUMIN 2.2* 1.8*   No results for input(s): LIPASE, AMYLASE in the last 168 hours. No results for input(s): AMMONIA in the last 168 hours. CBC: Recent Labs  Lab 06/24/21 0039 06/24/21 0505 06/24/21 1310 06/25/21 0427 06/25/21 1331  WBC 17.2* 15.7*  --  11.8*  --   NEUTROABS 14.4*  --   --  9.1*  --   HGB 7.5* 6.1* 8.0* 6.5* 8.5*  HCT 26.7* 21.9* 29.3* 22.5* 27.4*  MCV 80.7 79.9*  --  79.2*  --   PLT 566* 456*  --  335  --    Cardiac Enzymes: No results for input(s): CKTOTAL, CKMB, CKMBINDEX, TROPONINI in the last 168 hours. CBG: Recent Labs  Lab 06/24/21 1142 06/24/21 1646 06/25/21 0237 06/25/21 0755 06/25/21 1123  GLUCAP 98 116* 128* 88 88     Medications:  ceFEPime (MAXIPIME) IV 1 g (06/25/21 1019)   [START ON 06/26/2021] DAPTOmycin (CUBICIN)  IV      sodium chloride   Intravenous Once   sodium chloride   Intravenous Once   amiodarone  200 mg Oral Daily   calcitRIOL  0.5 mcg Oral Q T,Th,Sa-HD   Chlorhexidine Gluconate Cloth  6 each Topical Q0600   clonazePAM  0.5 mg Oral Once per day on Tue Thu Sat   clonazePAM  1 mg Oral Once per day on Sun Mon Wed Fri   [START ON 06/26/2021] darbepoetin (ARANESP) injection - DIALYSIS  200 mcg Intravenous Q Thu-HD   famotidine  10 mg Oral q AM   insulin  aspart  0-6 Units Subcutaneous TID WC   levothyroxine  175 mcg Oral QAC breakfast   metoCLOPramide  5 mg Oral TID AC & HS   midodrine  10 mg Oral TID   vitamin B-12  500 mcg Oral Daily

## 2021-06-25 NOTE — Progress Notes (Signed)
PT Cancellation Note  Patient Details Name: [name] MRN: [id number] DOB: February 10, 1940   Cancelled Treatment:    Reason Eval/Treat Not Completed: Fatigue/lethargy limiting ability to participate Pt reports she is very tired and would prefer to wait until tomorrow prior to initiating therapies. Will follow up as schedule allows.   [name], DPT  Acute Rehabilitation Services  Office: [phone]    Rudean Hitt 06/25/2021, 2:32 PM

## 2021-06-25 NOTE — Progress Notes (Signed)
OT Cancellation Note  Patient Details Name: LARNA CAPELLE MRN: [id number] DOB: 15-Oct-1940   Cancelled Treatment:    Reason Eval/Treat Not Completed: Fatigue/lethargy limiting ability to participate (Will follow.)  [name] 06/25/2021, 11:00 AM Nestor Lewandowsky, OTR/L Acute Rehabilitation Services Pager: [phone] Office: [phone]

## 2021-06-25 NOTE — Progress Notes (Signed)
Palliative Medicine Inpatient Follow Up Note   HPI: 81 y.o. 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    Patient missed HD on Saturday due to not feeling well.  Patient septic likely secondary to abdominal wound.  Given patient's multiple chronic comorbidities, PMT has been consulted to assist with goals of care conversation.  Today's Discussion 06/25/2021  *Please note that this is a verbal dictation therefore any spelling or grammatical errors are due to the "[place] [employer]" system interpretation.  Chart reviewed inclusive of vital signs, progress notes, laboratory results, and diagnostic images.   I met with [name] at bedside.  Shoshannah was quite sleepy and unable to participate in conversation this morning. [name] shares with me that in the past they had spoken to [place] about the thought of hospice care though Cordia had persistently vocalized wishing to continue hemodialysis.  We reviewed patient's abdominal wall ulcers and how over the last month they have gradually gotten worse. [name] expresses frustration that the hospice of [facility] nurse was not as attentive as she would have liked.  We reviewed presently the concerns for [name] in the setting of her depleted physical function and sepsis.  We discussed that sometimes patients can recover from these ailments and return back to baseline but more often than not they are weekend to a new baseline level of physical function.  Discussed that  Ashlon has likely also suffered ongoing encephalopathy in the setting of morphine as she is end-stage renal disease.  We reviewed that this may take time to clear. [name] endorses how tough her mom has been throughout their life.  She shares that in the last 3 years the relationship has really grown to that of the best friendship and she loves her more than anything. Created space and opportunity for patient to explore thoughts feelings and fears regarding current medical situation.  Marsha's fears surround Glennys's lack of recovery and how to explain that to her brother, her primary caregiver who suffers from a mild form of autism.  We reviewed that there is no easy way to go about these conversations but honesty is often the best approach. [name] is very tearful during our time together as she realizes Dequita may be encephalopathic and not able to make decisions pertaining to her final end-of-life journey which is overwhelming.  Furthermore, we discussed patient's cardiopulmonary resuscitation status.  I expressed to [name] that I worry if we were to put [facility] through cardiopulmonary resuscitation her outcomes would be horrific and she will be placed on life support which per her daughter she would have not wanted.  Ideally Chrys Racer would be able to participate in this conversation though at this time she is unable to do so.  We have agreed that for the time being to continue efforts to improve Kimberlly's health state.  I will try to obtain a copy of Sahar's most which was completing with hospice of [facility] to gain further insight on her wishes.  Questions and concerns addressed   Palliative Support Provided  ____________________________ Addendum:  I met at bedside with patient and her  daughter this afternoon. Sherrol was alert and oriented to time and place though not well enough to carry on with a robust goals of care conversation as she drifted off intermittently. [name]  expressed that she did not feel she was in her right state of mind for decisions.   [name] asked if we could obtain a COPY of the prior MOST form from [facility]. _____________________________ Addendum #2  Spoke to NP at [facility] and obtained a copy of the MOST with the wishes per below:  Cardiopulmonary Resuscitation: Attempt Resuscitation (CPR)  Medical Interventions: Full Scope of Treatment: Use intubation, advanced airway interventions, mechanical ventilation, cardioversion as indicated, medical treatment, IV fluids, etc, also provide comfort measures. Transfer to the hospital if indicated  Antibiotics: Antibiotics if indicated  IV Fluids: IV fluids if indicated  Feeding Tube: No feeding tube   [facility] share that patients goals were for two weeks of all effort and is not improvements to stop life prolonging/heroic measures.  Objective Assessment: Vital Signs Vitals:   06/25/21 0750 06/25/21 1120  BP: (!) 92/52 (!) 133/99  Pulse: (!) 53 (!) 52  Resp: 14 11  Temp: 97.9 F (36.6 C)   SpO2: 100% 100%    Intake/Output Summary (Last 24 hours) at 06/25/2021 1210 Last data filed at 06/25/2021 0930 Gross per 24 hour  Intake 630 ml  Output -200 ml  Net 830 ml   Gen: Elderly Caucasian female in no acute distress HEENT: Dry mucous membranes CV: Irregular rate and rhythm PULM: On 2 L nasal cannula breathing is even and nonlabored ABD: Colostomy in place EXT: No edema Neuro: Alert to self when awakened  SUMMARY OF RECOMMENDATIONS   -Full code/full scope of treatment -Patient is not ready for end-of-life or hospice at this time, agreeable to continuing palliative care -MOST form was discussed, will benefit from completion during admission -[name] is hopeful for patient to work with PT and possibly be set up with home health at discharge.  She would never put her mother in a SNF -It is very important for patient's daughter to have  good communication and updates -Psychosocial and emotional support provided -PMT will continue to follow  Time Spent: 10  Billing based on MDM: High ______________________________________________________________________________________ [place] Team Team Cell Phone: [phone] Please utilize secure chat with additional questions, if there is no response within 30 minutes please call the above phone number  Palliative Medicine Team providers are available by phone from 7am to 7pm daily and can be reached through the team cell phone.  Should this patient require assistance outside of these hours, please call the patient's attending physician.

## 2021-06-25 NOTE — Progress Notes (Incomplete)
FPTS Brief Progress Note  S:***   O: BP (!) 85/44   Pulse 60   Temp 98.6 F (37 C) (Oral)   Resp 10   Ht '5\' 2"'$  (1.575 m)   SpO2 100%   BMI 35.74 kg/m     A/P: Sepsis likely 2/2 abdominal wound Altered mental status, improving  Patient remains intermittently hypotensive. Will continue to monitor and bolus cautiously if needed overnight.  - Orders reviewed. Labs for AM ordered, which was adjusted as needed.    Shary Key, DO 06/25/2021, 10:20 PM PGY-***, [facility] Family Medicine Night Resident  Please page [phone] with questions.

## 2021-06-25 NOTE — Progress Notes (Signed)
Received a consult for a blood draw for this patient. Upon arrival, it was noted pt has a tunneled HD catheter to left chest. Pt to be a lab draw and will add line to flowsheet.

## 2021-06-26 DIAGNOSIS — Z85038 Personal history of other malignant neoplasm of large intestine: Secondary | ICD-10-CM | POA: Diagnosis not present

## 2021-06-26 DIAGNOSIS — Z1509 Genetic susceptibility to other malignant neoplasm: Secondary | ICD-10-CM

## 2021-06-26 DIAGNOSIS — K921 Melena: Secondary | ICD-10-CM

## 2021-06-26 DIAGNOSIS — A419 Sepsis, unspecified organism: Secondary | ICD-10-CM | POA: Diagnosis not present

## 2021-06-26 DIAGNOSIS — D649 Anemia, unspecified: Secondary | ICD-10-CM

## 2021-06-26 LAB — BPAM RBC
Blood Product Expiration Date: 202306202359
Blood Product Expiration Date: 202306202359
ISSUE DATE / TIME: 202305300827
ISSUE DATE / TIME: 202305310622
Unit Type and Rh: 6200
Unit Type and Rh: 6200

## 2021-06-26 LAB — BASIC METABOLIC PANEL
Anion gap: 11 (ref 5–15)
BUN: 17 mg/dL (ref 8–23)
CO2: 23 mmol/L (ref 22–32)
Calcium: 8.1 mg/dL — ABNORMAL LOW (ref 8.9–10.3)
Chloride: 101 mmol/L (ref 98–111)
Creatinine, Ser: 5.62 mg/dL — ABNORMAL HIGH (ref 0.44–1.00)
GFR, Estimated: 7 mL/min — ABNORMAL LOW (ref >60)
Glucose, Bld: 104 mg/dL — ABNORMAL HIGH (ref 70–99)
Potassium: 3.2 mmol/L — ABNORMAL LOW (ref 3.5–5.1)
Sodium: 135 mmol/L (ref 135–145)

## 2021-06-26 LAB — GLUCOSE, CAPILLARY
Glucose-Capillary: 98 mg/dL (ref 70–99)
Glucose-Capillary: 81 mg/dL (ref 70–99)
Glucose-Capillary: 84 mg/dL (ref 70–99)
Glucose-Capillary: 72 mg/dL (ref 70–99)

## 2021-06-26 LAB — TYPE AND SCREEN
ABO/RH(D): A POS
Antibody Screen: NEGATIVE
Unit division: 0
Unit division: 0

## 2021-06-26 LAB — CBC
HCT: 28.2 % — ABNORMAL LOW (ref 36.0–46.0)
HCT: 28.9 % — ABNORMAL LOW (ref 36.0–46.0)
Hemoglobin: 8.4 g/dL — ABNORMAL LOW (ref 12.0–15.0)
Hemoglobin: 8.7 g/dL — ABNORMAL LOW (ref 12.0–15.0)
MCH: 24.2 pg — ABNORMAL LOW (ref 26.0–34.0)
MCH: 24.4 pg — ABNORMAL LOW (ref 26.0–34.0)
MCHC: 29.8 g/dL — ABNORMAL LOW (ref 30.0–36.0)
MCHC: 30.1 g/dL (ref 30.0–36.0)
MCV: 81.3 fL (ref 80.0–100.0)
MCV: 81 fL (ref 80.0–100.0)
Platelets: 331 10*3/uL (ref 150–400)
Platelets: 273 10*3/uL (ref 150–400)
RBC: 3.47 MIL/uL — ABNORMAL LOW (ref 3.87–5.11)
RBC: 3.57 MIL/uL — ABNORMAL LOW (ref 3.87–5.11)
RDW: 17.9 % — ABNORMAL HIGH (ref 11.5–15.5)
RDW: 18.5 % — ABNORMAL HIGH (ref 11.5–15.5)
WBC: 9.3 10*3/uL (ref 4.0–10.5)
WBC: 9.3 10*3/uL (ref 4.0–10.5)
nRBC: 0 % (ref 0.0–0.2)
nRBC: 0 % (ref 0.0–0.2)

## 2021-06-26 LAB — CULTURE, BLOOD (ROUTINE X 2): Special Requests: ADEQUATE

## 2021-06-26 LAB — CK: Total CK: 32 U/L — ABNORMAL LOW (ref 38–234)

## 2021-06-26 MED ORDER — HALOPERIDOL LACTATE 5 MG/ML IJ SOLN
2.0000 mg | Freq: Once | INTRAMUSCULAR | Status: AC
  Administered 2021-06-26: 2 mg via INTRAVENOUS
  Filled 2021-06-26: qty 1

## 2021-06-26 MED ORDER — ANTICOAGULANT SODIUM CITRATE 4% (200MG/5ML) IV SOLN: 5.0000 mL | Status: DC | PRN

## 2021-06-26 MED ORDER — LIDOCAINE HCL (PF) 1 % IJ SOLN
5.0000 mL | INTRAMUSCULAR | Status: DC | PRN
  Filled 2021-06-26: qty 5

## 2021-06-26 MED ORDER — LORAZEPAM 2 MG/ML IJ SOLN
1.0000 mg | Freq: Once | INTRAMUSCULAR | Status: AC
  Filled 2021-06-26: qty 1

## 2021-06-26 MED ORDER — ALTEPLASE 2 MG IJ SOLR: 2.0000 mg | Freq: Once | INTRAMUSCULAR | Status: DC | PRN

## 2021-06-26 MED ORDER — HEPARIN SODIUM (PORCINE) 1000 UNIT/ML DIALYSIS
1000.0000 [IU] | INTRAMUSCULAR | Status: DC | PRN
Start: 2021-06-26 — End: 2021-06-28

## 2021-06-26 MED ORDER — POLYETHYLENE GLYCOL 3350 17 GM/SCOOP PO POWD
0.5000 | Freq: Once | ORAL | Status: AC
  Administered 2021-06-26: 127.5 g via ORAL
  Filled 2021-06-26: qty 255

## 2021-06-26 MED ORDER — LIDOCAINE-PRILOCAINE 2.5-2.5 % EX CREA
1.0000 | TOPICAL_CREAM | CUTANEOUS | Status: DC | PRN
Start: 2021-06-26 — End: 2021-06-28
  Filled 2021-06-26: qty 5

## 2021-06-26 MED ORDER — POTASSIUM CHLORIDE CRYS ER 20 MEQ PO TBCR
20.0000 meq | EXTENDED_RELEASE_TABLET | Freq: Two times a day (BID) | ORAL | Status: AC
  Administered 2021-06-26: 20 meq via ORAL
  Filled 2021-06-26 (×3): qty 1

## 2021-06-26 MED ORDER — PENTAFLUOROPROP-TETRAFLUOROETH EX AERO: 1.0000 "application " | INHALATION_SPRAY | CUTANEOUS | Status: DC | PRN

## 2021-06-26 NOTE — Evaluation (Signed)
Physical Therapy Evaluation Patient Details Name: [name] MRN: [id number] DOB: 1940-11-05 Today's Date: 06/26/2021  History of Present Illness  81 y.o. female admitted 5/29 with confusion and hypotension. PMHx:  ESRD on HD, Lynch syndrome with hx colon cancer s/p R hemicolectomy with colostomy, hypothyroidism, asthma, GERD, T2DM, paroxysmal A fib on eliquis, CAD, HLD, gout  Clinical Impression  Pt with confusion but pleasant and able to follow commands to rise and transfer from bed to and from chair. Pt with limited tolerance for gait and reports using WC majority of time at home. Daughter present to provide home setup and PLOF.  Pt with continued low BP but without significant orthostatic hypotension. Pt with decreased mobility, cognition and function who will benefit from acute therapy to maximize mobility, safety and independence.   Supine 91/49 (63), HR 60 Sitting 85/57 (67) After transfers 91/54 (67), HR 58     Recommendations for follow up therapy are one component of a multi-disciplinary discharge planning process, led by the attending physician.  Recommendations may be updated based on patient status, additional functional criteria and insurance authorization.  Follow Up Recommendations Home health PT    Assistance Recommended at Discharge Frequent or constant Supervision/Assistance  Patient can return home with the following  A little help with walking and/or transfers;A little help with bathing/dressing/bathroom;Assistance with cooking/housework;Direct supervision/assist for financial management;Assist for transportation;Help with stairs or ramp for entrance    Equipment Recommendations None recommended by PT  Recommendations for Other Services       Functional Status Assessment Patient has had a recent decline in their functional status and/or demonstrates limited ability to make significant improvements in function in a reasonable and predictable amount of time      Precautions / Restrictions Precautions Precautions: Fall Precaution Comments: intemittent O2 use      Mobility  Bed Mobility Overal bed mobility: Needs Assistance Bed Mobility: Supine to Sit, Sit to Supine     Supine to sit: Min assist Sit to supine: Min guard   General bed mobility comments: pt with HOB 20 degrees and reliance on rail with min assist to rise to sitting. Pt able to lift legs to surface without physical assist    Transfers Overall transfer level: Needs assistance   Transfers: Sit to/from Stand Sit to Stand: Min guard           General transfer comment: minguard with cues for hand placement to rise from bed and recliner. min assist with Rw to pivot to chair. minguard to step 3' forward and back at recliner    Ambulation/Gait               General Gait Details: pt denied attempting  Stairs            Wheelchair Mobility    Modified Rankin (Stroke Patients Only)       Balance Overall balance assessment: Needs assistance   Sitting balance-Leahy Scale: Fair Sitting balance - Comments: EOB without support   Standing balance support: Bilateral upper extremity supported Standing balance-Leahy Scale: Poor Standing balance comment: RW for standing                             Pertinent Vitals/Pain Pain Assessment Pain Assessment: No/denies pain    Home Living Family/patient expects to be discharged to:: Private residence Living Arrangements: Children Available Help at Discharge: Available 24 hours/day;Family Type of Home: House Home Access: Ramped entrance  Home Layout: One level Home Equipment: Rollator (4 wheels);Wheelchair - manual;BSC/3in1;Other (comment)      Prior Function               Mobility Comments: primarily transferring to/from w/c with family assist, can propel self short distances ADLs Comments: Family assisting with BSC transfers, all bathing/dressing tasks.     Hand Dominance         Extremity/Trunk Assessment   Upper Extremity Assessment Upper Extremity Assessment: Generalized weakness    Lower Extremity Assessment Lower Extremity Assessment: Generalized weakness    Cervical / Trunk Assessment Cervical / Trunk Assessment: Kyphotic;Other exceptions Cervical / Trunk Exceptions: increased body habitus  Communication   Communication: HOH  Cognition Arousal/Alertness: Awake/alert Behavior During Therapy: WFL for tasks assessed/performed Overall Cognitive Status: Impaired/Different from baseline Area of Impairment: Orientation, Memory, Safety/judgement                 Orientation Level: Disoriented to, Time, Situation, Place   Memory: Decreased short-term memory   Safety/Judgement: Decreased awareness of deficits, Decreased awareness of safety              General Comments      Exercises     Assessment/Plan    PT Assessment Patient needs continued PT services  PT Problem List Decreased strength;Decreased mobility;Decreased safety awareness;Decreased activity tolerance;Decreased cognition;Decreased balance;Decreased knowledge of use of DME       PT Treatment Interventions Gait training;Balance training;Functional mobility training;Therapeutic activities;Patient/family education;Cognitive remediation;Therapeutic exercise;DME instruction    PT Goals (Current goals can be found in the Care Plan section)  Acute Rehab PT Goals Patient Stated Goal: return home PT Goal Formulation: With patient/family Time For Goal Achievement: 07/10/21 Potential to Achieve Goals: Fair    Frequency Min 3X/week     Co-evaluation               AM-PAC PT "6 Clicks" Mobility  Outcome Measure Help needed turning from your back to your side while in a flat bed without using bedrails?: A Little Help needed moving from lying on your back to sitting on the side of a flat bed without using bedrails?: A Little Help needed moving to and from a bed to a  chair (including a wheelchair)?: A Little Help needed standing up from a chair using your arms (e.g., wheelchair or bedside chair)?: A Little Help needed to walk in hospital room?: A Little Help needed climbing 3-5 steps with a railing? : Total 6 Click Score: 16    End of Session Equipment Utilized During Treatment: Gait belt Activity Tolerance: Patient tolerated treatment well Patient left: in bed;with bed alarm set;with family/visitor present;with nursing/sitter in room (pt transferred to chair then back to bed due to HD calling for transport) Nurse Communication: Mobility status PT Visit Diagnosis: Other abnormalities of gait and mobility (R26.89);Difficulty in walking, not elsewhere classified (R26.2);Muscle weakness (generalized) (M62.81)    Time: 1660-6301 PT Time Calculation (min) (ACUTE ONLY): 26 min   Charges:   PT Evaluation $PT Eval Moderate Complexity: 1 Mod PT Treatments $Therapeutic Activity: 8-22 mins        [name] P, PT Acute Rehabilitation Services Pager: [phone] Office: [phone]   Brynley Cuddeback B Lorraine Cimmino 06/26/2021, 8:40 AM

## 2021-06-26 NOTE — Progress Notes (Signed)
OT Cancellation Note  Patient Details Name: [name] MRN: [id number] DOB: [birth year]   Cancelled Treatment:    Reason Eval/Treat Not Completed: Patient at procedure or test/ unavailable (2nd attempt, pt still in HD.)  [name] 06/26/2021, 2:15 PM

## 2021-06-26 NOTE — Progress Notes (Signed)
Patient refused [place].

## 2021-06-26 NOTE — TOC PASRR Note (Signed)
Patient became agitated, removing essential equipment, attempting to removed HD cath. Removing tele leads, O2, BP cuff.   Dr. Curly Rim, at bedside now.

## 2021-06-26 NOTE — Progress Notes (Signed)
FPTS Brief Progress Note  S: Patient improved today, however still having waxing and waning orientation. Believe event last night was likely hospital delirium, will continue to monitor closely. Discussed at length hospital delirium with patient's daughter [name], who is at bedside.    O: BP (!) 85/51 (BP Location: Left Arm)   Pulse (!) 59   Temp 97.8 F (36.6 C) (Oral)   Resp 18   Ht '5\' 2"'$  (1.575 m)   Wt 85.9 kg   SpO2 96%   BMI 34.64 kg/m     A/P: - Will relay to day team to keep [name] updated daily - Patient has been hypotensive all day, but currently stable. Goal is Map >60. If continuously <60, for example persistently at MAP 50, can use 250 cc bolus and reassess. Currently on midodrine. - Patient is very calm at the moment, oriented to self. If becomes slightly agitated, can consider low dose of seroquel first. If has another episode of severe delirium and agitation, would use haldol as that did not cause any reactions last night and did help with agitation. Discussed with RN as well. - Orders reviewed. Labs for AM ordered, which was adjusted as needed.   Gladys Damme, MD 06/26/2021, 8:57 PM PGY-3, [facility] Night Resident  Please page [phone] with questions.

## 2021-06-26 NOTE — Progress Notes (Signed)
Patient refused BP cuff, and pulled pulse Ox probed off.

## 2021-06-26 NOTE — Plan of Care (Signed)
  Problem: Clinical Measurements: Goal: Will remain free from infection Outcome: Progressing Goal: Diagnostic test results will improve Outcome: Progressing   Problem: Education: Goal: Knowledge of General Education information will improve Description: Including pain rating scale, medication(s)/side effects and non-pharmacologic comfort measures Outcome: Progressing

## 2021-06-26 NOTE — Progress Notes (Signed)
Subjective: Seen in dialysis, complains of mild discomfort on abdomen, noted events earlier of confusion agitation.  Currently appears about baseline stable states today is Thursday at [facility] and this is her dialysis day.  BP 87 on dialysis but asymptomatic, could be related to prior sedatives given for agitation  Objective Vital signs in last 24 hours: Vitals:   06/26/21 0835 06/26/21 0907 06/26/21 0953 06/26/21 1016  BP: (!) 85/57 (!) 87/49 (!) 71/42 (!) 84/46  Pulse:  (!) 53 (!) 55 (!) 101  Resp:  '14 14 17  '$ Temp: 97.8 F (36.6 C) 97.8 F (36.6 C)    TempSrc: Oral Oral    SpO2: 100%     Weight:  87 kg    Height:       Weight change:   Physical Exam: General: Obese elderly female chronically ill,.  Appearance and in  NAD Heart: RRR no MRG Lungs: CTA bilaterally nonlabored breathing nasal cannula oxygen Abdomen: Obese, NABS, soft left lower quadrant wound dressing dry clear ileostomy bag with stool dark stool present no blood noted Extremities: No pedal edema Dialysis Access: Left IJ TDC patent on HD    Home meds include - tylenol, albuterol, amiodarone, apixaban, clonazepam, famotidine, fluticasone, hydromorphone, levalbuterol, metoclopramide, midodrine 10 tid, morphine IR, renavit, O2 3 L, promethazine, synthroid, cyanocobalamin        OP HD: [facility] TTS  3.5h  400/1.5  87.5kg  2/2 bath TDC  Hep 2700   - mircera 225 mcg q2, last 5/18, due 6/01 - rocaltrol 0.5 mcg tiw po - last HD 5/25, 90.1 > 89.9kg     Problem/Plan: Sepsis - w/ chills, AMS at home.  Etiology thought to be abdominal wound, on admit high HR and soft BP's in ED. vitals did improve on admission, slightly low today on dialysis but related to sedatives given, has midodrine predialysis.  Admit team started on broad spec IV abx. ESRD - on HD TTS.  HD today on schedule, midodrine on dialysis days for BP control  Hypotension/ volume - BP's 80s when I saw her on dialysis and she was asymptomatic.  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 yesterday dropped to 6.5  transfusion RBC, transfuse prn. Next esa due 6/01,  order darbe 200 ug q Thursday while here.  MBD ckd - CCa in range, add on phos. Cont po vdra.   PAF - on eliquis, SR on exam, Rx per pmd Chronic anxiety /AMS this a.m. now clear after Haldol and Ativan  Ernest Haber, PA-C [facility] Beeper [phone] 06/26/2021,10:23 AM  LOS: 2 days   Labs: Basic Metabolic Panel: Recent Labs  Lab 06/24/21 0505 06/24/21 1310 06/25/21 0427 06/26/21 0151  NA 132*  --  136 135  K 4.4  --  3.2* 3.2*  CL 99  --  101 101  CO2 21*  --  26 23  GLUCOSE 157*  --  115* 104*  BUN 31*  --  11 17  CREATININE 9.36*  --  4.22* 5.62*  CALCIUM 8.1*  --  7.9* 8.1*  PHOS  --  8.1*  --   --    Liver Function Tests: Recent Labs  Lab 06/24/21 0039 06/24/21 0505  AST 27 24  ALT 18 13  ALKPHOS 103 77  BILITOT 0.6 0.3  PROT 5.5* 4.6*  ALBUMIN 2.2* 1.8*   No results for input(s): LIPASE, AMYLASE in the last 168 hours. No results for input(s): AMMONIA in the  last 168 hours. CBC: Recent Labs  Lab 06/24/21 0039 06/24/21 0505 06/24/21 1310 06/25/21 0427 06/25/21 1331 06/26/21 0151  WBC 17.2* 15.7*  --  11.8*  --  9.3  NEUTROABS 14.4*  --   --  9.1*  --   --   HGB 7.5* 6.1*   < > 6.5* 8.5* 8.4*  HCT 26.7* 21.9*   < > 22.5* 27.4* 28.2*  MCV 80.7 79.9*  --  79.2*  --  81.3  PLT 566* 456*  --  335  --  331   < > = values in this interval not displayed.   Cardiac Enzymes: Recent Labs  Lab 06/26/21 0151  CKTOTAL 32*   CBG: Recent Labs  Lab 06/25/21 0755 06/25/21 1123 06/25/21 1607 06/25/21 2106 06/26/21 0806  GLUCAP 88 88 102* 110* 98    Studies/Results: No results found. Medications:  ceFEPime (MAXIPIME) IV Stopped (06/25/21 1118)   DAPTOmycin (CUBICIN)  IV      sodium chloride   Intravenous Once   sodium chloride   Intravenous Once   amiodarone  200 mg Oral Daily   calcitRIOL  0.5 mcg Oral Q  T,Th,Sa-HD   Chlorhexidine Gluconate Cloth  6 each Topical Q0600   clonazePAM  0.5 mg Oral Once per day on Tue Thu Sat   clonazePAM  1 mg Oral Once per day on Sun Mon Wed Fri   darbepoetin (ARANESP) injection - DIALYSIS  200 mcg Intravenous Q Thu-HD   famotidine  10 mg Oral q AM   insulin aspart  0-6 Units Subcutaneous TID WC   levothyroxine  175 mcg Oral QAC breakfast   metoCLOPramide  5 mg Oral TID AC & HS   midodrine  10 mg Oral TID   potassium chloride  20 mEq Oral BID   vitamin B-12  500 mcg Oral Daily

## 2021-06-26 NOTE — H&P (View-Only) (Signed)
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 Conjunctiva pink. Ears:  Normal auditory acuity. Nose:  No deformity, discharge or lesions. Mouth: Poor dentition no ulcers or lesions.  Neck:  Supple. No lymphadenopathy or thyromegaly.  Lungs: Sounds clear, decreased in the bases Heart: Regular rate and rhythm, no murmurs Abdomen: Obese abdomen, nontender.  LLQ ostomy intact with a moderate amount of brown watery liquid. No red blood.  Questionable parastomal hernia. LLQ drsg intact.  Rectal: Deferred. Musculoskeletal:  Symmetrical without gross deformities.  Pulses:  Normal pulses noted. Extremities:  Without clubbing or edema. Neurologic:  Alert and  oriented x 4. No focal deficits.  Skin:  Intact without significant lesions or rashes. Psych:  Alert and cooperative. Normal mood and affect.  Intake/Output from previous  day: 05/31 0701 - 06/01 0700 In: 832 [Blood:630; IV Piggyback:202] Out: 0  Intake/Output this shift: No intake/output data recorded.  Lab Results: Recent Labs    06/24/21 0505 06/24/21 1310 06/25/21 0427 06/25/21 1331 06/26/21 0151  WBC 15.7*  --  11.8*  --  9.3  HGB 6.1*   < > 6.5* 8.5* 8.4*  HCT 21.9*   < > 22.5* 27.4* 28.2*  PLT 456*  --  335  --  331   < > = values in this interval not displayed.   BMET Recent Labs    06/24/21 0505 06/25/21 0427 06/26/21 0151  NA 132* 136 135  K 4.4 3.2* 3.2*  CL 99 101 101  CO2 21* 26 23  GLUCOSE 157* 115* 104*  BUN 31* 11 17  CREATININE 9.36* 4.22* 5.62*  CALCIUM 8.1* 7.9* 8.1*   LFT Recent Labs    06/24/21 0505  PROT 4.6*  ALBUMIN 1.8*  AST 24  ALT 13  ALKPHOS 77  BILITOT 0.3   PT/INR Recent Labs    06/24/21 0039  LABPROT 17.6*  INR 1.5*   Hepatitis Panel Recent Labs    06/24/21 1310  HEPBSAG NON REACTIVE      Studies/Results: No results found.  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 S/P ileoscopy 01/2021 showed single AVM in the ileum treated with APC.  Colonoscopy 01/2021 showed diverticulosis sigmoid colon, colonoscopy was incomplete secondary  colon fixation likely due to adhesions. Hemoglobin 7.5 (baseline Hg 10 -11) -> 6.0 transfused with 1 unit of PRBC -> posttransfusion hemoglobin 8.0 -> Hg 6.5 on 5/31 transfused 1 units of PRBCs -> Hg 8.5. -Transfuse for hemoglobin less than 7 -Continue to monitor ostomy output for active bleed -Defer recommendations regarding repeat endoscopic evaluation to Dr. Hilarie Fredrickson  2) Chronic LLQ wound, evaluated by general surgery, surgical debridement not warranted.  Followed by the ostomy/wound clinic.   3) Afib on Eliquis.  -Continue to hold  Eliquis  4) ESRD on HD, currently receiving dialysis   [name]  06/26/2021, 1:53PM

## 2021-06-26 NOTE — Progress Notes (Signed)
Arrived at patient's bedside after receiving page from patient's nurse about patient being agitated and pulling her leads.  Upon arrival patient was awake, laying in bed and appeared delirious with son at bedside.  Patient's son reports patient is agitated and confused.  Patient was able to recognize me and asked to speak to her daughter, [name]. Patient wasn't responding well to redirections or command, instead appeared more agitated as she continued her effort to get out of the bed. After failed attempt to redirect patient, she was given '1mg'$  of ativan which wasn't effective as patient continued to be agitated. Patient subsequently received '2mg'$  of haldol while the medical team continued to redirect her.  [name], MD PGY-1, [place] Medicine Resident  Please page [phone] with questions.

## 2021-06-26 NOTE — Progress Notes (Addendum)
Family Medicine Teaching Service Daily Progress Note Intern Pager: [phone]  Patient name: [name] Medical record number: [id number] Date of birth: October 15, 1940 Age: 81 y.o. Gender: female  Primary Care Provider: [name], MD Consultants: Nephrology, palliative medicine Code Status: Full  Pt Overview and Major Events to Date:  5/30: Admitted  Assessment and Plan:  [name] is a 81 year old female presenting with hypotension and confusion after starting new medications and missing HD, found to be septic on admission.  PMH significant for ESRD on HD, T2DM, PAF on Eliquis, CAD, HLD, GERD, asthma, and Lynch syndrome c/b colon cancer s/p right hemicolectomy with ileostomy.  Sepsis likely 2/2 abdominal wound Altered mental status Instability of BP over past 24 hours, most recent 91/49.  Patient no longer tachycardic, and no leukocytosis with WBC 9.3.  Patient appears to be responding well to antibiotic therapy.  Remains somnolent; likely multifactorial in the setting of sepsis/anemia/medications and missed HD.  General surgery consulted, advised that there is no drainable abscess.  Initial blood cultures with Staph epidermidis, skin contaminant; repeat blood cultures with no growth less than 24 hours. - Continue IV cefepime and daptomycin, both initiated 5/30. - Per nephrology, 250 to 500 cc fluid boluses as needed hypotension  Hypotension Patient hypotensive at baseline. Most recent BP 91/49.  24-hour BP 75-165/40-152.  With improvements upon receiving fluid boluses and midodrine compliance. - Continue home midodrine 3 times daily  Anemia of chronic disease Hgb 8.4, s/p 2 units PRBCs -Continue to monitor daily CBC -Transfusion threshold <8. - Consulted GI to assess for source of bleeding; suspected stoma  ESRD on HD Tuesday/Thursday/Saturday schedule. -Nephrology following; appreciate recs -Continue HD as scheduled  Prolonged QTc on EKG May be 2/2 hypokalemia.   Electrolytes balanced during HD. - We will repeat EKG after HD - Continue to monitor electrolytes on daily BMP  Lynch syndrome Hx of colon cancer s/p right hemicolectomy with ileostomy Wound care on board.  Patient with normal stool without blood present in ileostomy bag.  T2DM Diet controlled -vsSSI -CBG monitoring  PAF -Continue home amiodarone - Continue to hold home Eliquis for concerns for bleed  Chronic conditions: CAD-stable, not on any antiplatelets Anxiety-continue home Klonopin 1 mg on nondialysis days and 0.5 mg on dialysis days Hypothyroidism-continue home Synthroid 175 mcg daily GERD-Home famotidine decreased to 10 mg daily in light of renal function   FEN/GI: Renal PPx: SCDs Dispo:Pending PT recommendations  pending clinical improvement . Barriers include clinical improvement.   Subjective:  Patient with delirious episode last night, resolved with 1 mg Ativan and 2 mg Haldol.  This a.m., patient seen in HD.  She is more awake and alert than previous days, and fully oriented to person, time, and place.  She reports abdominal pain, but denies acute concerns or complaints.  Objective: Temp:  [97.7 F (36.5 C)-98.6 F (37 C)] 97.9 F (36.6 C) (06/01 0333) Pulse Rate:  [52-84] 64 (06/01 0333) Resp:  [10-25] 14 (06/01 0333) BP: (75-165)/(40-152) 165/152 (06/01 0333) SpO2:  [84 %-100 %] 97 % (06/01 0333) Physical Exam: General: Chronically ill-appearing obese elderly female lying in bed, NAD; less somnolent than previous days Cardiovascular: RRR, no abnormal heart sounds auscultated Respiratory: CTA x2; normal WOB on room air Abdomen: No acute abdominal wound covered, dressing c/d/i.  Ileostomy bag with normal to dark appearing stool and no blood present Extremities: Nonedematous BLE  Laboratory: Recent Labs  Lab 06/24/21 0505 06/24/21 1310 06/25/21 0427 06/25/21 1331 06/26/21 0151  WBC 15.7*  --  11.8*  --  9.3  HGB 6.1*   < > 6.5* 8.5* 8.4*  HCT 21.9*    < > 22.5* 27.4* 28.2*  PLT 456*  --  335  --  331   < > = values in this interval not displayed.   Recent Labs  Lab 06/24/21 0039 06/24/21 0505 06/25/21 0427 06/26/21 0151  NA 134* 132* 136 135  K 4.7 4.4 3.2* 3.2*  CL 98 99 101 101  CO2 21* 21* 26 23  BUN 31* 31* 11 17  CREATININE 10.00* 9.36* 4.22* 5.62*  CALCIUM 8.8* 8.1* 7.9* 8.1*  PROT 5.5* 4.6*  --   --   BILITOT 0.6 0.3  --   --   ALKPHOS 103 77  --   --   ALT 18 13  --   --   AST 27 24  --   --   GLUCOSE 117* 157* 115* 104*     Imaging/Diagnostic Tests: No new images to review  Rosezetta Schlatter, MD 06/26/2021, 8:05 AM PGY-1, [place] Intern pager: [phone], text pages welcome

## 2021-06-26 NOTE — Progress Notes (Signed)
   06/25/21 2345  Vitals  BP 93/73  MAP (mmHg) 81  Pulse Rate 74  ECG Heart Rate 74  Resp 19  MEWS COLOR  MEWS Score Color Green  Oxygen Therapy  SpO2 95 %  MEWS Score  MEWS Temp 0  MEWS Systolic 1  MEWS Pulse 0  MEWS RR 0  MEWS LOC 0  MEWS Score 1     Patient became increasingly agitated, attempting to removed HD, cath, PIV, tele leads, O2 [place]. MD paged. RR at bedside.   One time dose of Ativan administered.   Family at bedside

## 2021-06-26 NOTE — Progress Notes (Signed)
OT Cancellation Note  Patient Details Name: [name] MRN: [id number] DOB: [birth year]   Cancelled Treatment:    Reason Eval/Treat Not Completed: Patient at procedure or test/ unavailable (HD)  [name] 06/26/2021, 9:46 AM Nestor Lewandowsky, OTR/L Acute Rehabilitation Services Pager: [phone] Office: [phone]

## 2021-06-26 NOTE — Progress Notes (Signed)
Patient and family deferred wound care.

## 2021-06-26 NOTE — Consult Note (Signed)
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 atraumatic. Eyes:  No scleral icterus. Conjunctiva pink. Ears:  Normal auditory acuity. Nose:  No deformity, discharge or lesions. Mouth: Poor dentition no ulcers or lesions.  Neck:  Supple. No lymphadenopathy or thyromegaly.  Lungs: Sounds clear, decreased in the bases Heart: Regular rate and rhythm, no murmurs Abdomen: Obese abdomen, nontender.  LLQ ostomy intact with a moderate amount of brown watery liquid. No red blood.  Questionable parastomal hernia. LLQ drsg intact.  Rectal: Deferred. Musculoskeletal:  Symmetrical without gross deformities.  Pulses:  Normal pulses noted. Extremities:  Without clubbing or edema. Neurologic:  Alert and  oriented x 4. No focal deficits.  Skin:  Intact without significant lesions or rashes. Psych:  Alert and cooperative. Normal mood and affect.  Intake/Output from previous  day: 05/31 0701 - 06/01 0700 In: 832 [Blood:630; IV Piggyback:202] Out: 0  Intake/Output this shift: No intake/output data recorded.  Lab Results: Recent Labs    06/24/21 0505 06/24/21 1310 06/25/21 0427 06/25/21 1331 06/26/21 0151  WBC 15.7*  --  11.8*  --  9.3  HGB 6.1*   < > 6.5* 8.5* 8.4*  HCT 21.9*   < > 22.5* 27.4* 28.2*  PLT 456*  --  335  --  331   < > = values in this interval not displayed.   BMET Recent Labs    06/24/21 0505 06/25/21 0427 06/26/21 0151  NA 132* 136 135  K 4.4 3.2* 3.2*  CL 99 101 101  CO2 21* 26 23  GLUCOSE 157* 115* 104*  BUN 31* 11 17  CREATININE 9.36* 4.22* 5.62*  CALCIUM 8.1* 7.9* 8.1*   LFT Recent Labs    06/24/21 0505  PROT 4.6*  ALBUMIN 1.8*  AST 24  ALT 13  ALKPHOS 77  BILITOT 0.3   PT/INR Recent Labs    06/24/21 0039  LABPROT 17.6*  INR 1.5*   Hepatitis Panel Recent Labs    06/24/21 1310  HEPBSAG NON REACTIVE      Studies/Results: No results found.  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 S/P ileoscopy 01/2021 showed single AVM in the ileum treated with APC.  Colonoscopy 01/2021 showed diverticulosis sigmoid colon, colonoscopy was incomplete secondary  colon fixation likely due to adhesions. Hemoglobin 7.5 (baseline Hg 10 -11) -> 6.0 transfused with 1 unit of PRBC -> posttransfusion hemoglobin 8.0 -> Hg 6.5 on 5/31 transfused 1 units of PRBCs -> Hg 8.5. -Transfuse for hemoglobin less than 7 -Continue to monitor ostomy output for active bleed -Defer recommendations regarding repeat endoscopic evaluation to Dr. Hilarie Fredrickson  2) Chronic LLQ wound, evaluated by general surgery, surgical debridement not warranted.  Followed by the ostomy/wound clinic.   3) Afib on Eliquis.  -Continue to hold  Eliquis  4) ESRD on HD, currently receiving dialysis   Noralyn Pick  06/26/2021, 1:53PM

## 2021-06-27 ENCOUNTER — Encounter (HOSPITAL_COMMUNITY)
Admission: EM | Disposition: A | Discharge status: Home Health | Payer: Self-pay | Source: Home / Self Care | Attending: Family Medicine

## 2021-06-27 ENCOUNTER — Inpatient Hospital Stay (HOSPITAL_COMMUNITY): Payer: Medicare HMO | Admitting: Anesthesiology

## 2021-06-27 ENCOUNTER — Encounter (HOSPITAL_COMMUNITY): Payer: Self-pay | Admitting: Family Medicine

## 2021-06-27 DIAGNOSIS — K31811 Angiodysplasia of stomach and duodenum with bleeding: Secondary | ICD-10-CM

## 2021-06-27 DIAGNOSIS — A419 Sepsis, unspecified organism: Secondary | ICD-10-CM | POA: Diagnosis not present

## 2021-06-27 DIAGNOSIS — D1339 Benign neoplasm of other parts of small intestine: Secondary | ICD-10-CM | POA: Diagnosis not present

## 2021-06-27 DIAGNOSIS — D649 Anemia, unspecified: Secondary | ICD-10-CM | POA: Diagnosis not present

## 2021-06-27 DIAGNOSIS — K2971 Gastritis, unspecified, with bleeding: Secondary | ICD-10-CM | POA: Diagnosis not present

## 2021-06-27 DIAGNOSIS — K449 Diaphragmatic hernia without obstruction or gangrene: Secondary | ICD-10-CM

## 2021-06-27 DIAGNOSIS — K2970 Gastritis, unspecified, without bleeding: Secondary | ICD-10-CM | POA: Diagnosis not present

## 2021-06-27 HISTORY — PX: HEMOSTASIS CLIP PLACEMENT: SHX6857

## 2021-06-27 HISTORY — PX: ESOPHAGOGASTRODUODENOSCOPY (EGD) WITH PROPOFOL: SHX5813

## 2021-06-27 HISTORY — PX: HOT HEMOSTASIS: SHX5433

## 2021-06-27 HISTORY — PX: BIOPSY: SHX5522

## 2021-06-27 HISTORY — PX: ILEOSCOPY: SHX5434

## 2021-06-27 HISTORY — PX: POLYPECTOMY: SHX5525

## 2021-06-27 LAB — CBC
HCT: 29.6 % — ABNORMAL LOW (ref 36.0–46.0)
Hemoglobin: 8.7 g/dL — ABNORMAL LOW (ref 12.0–15.0)
MCH: 24.2 pg — ABNORMAL LOW (ref 26.0–34.0)
MCHC: 29.4 g/dL — ABNORMAL LOW (ref 30.0–36.0)
MCV: 82.5 fL (ref 80.0–100.0)
Platelets: 271 10*3/uL (ref 150–400)
RBC: 3.59 MIL/uL — ABNORMAL LOW (ref 3.87–5.11)
RDW: 18.6 % — ABNORMAL HIGH (ref 11.5–15.5)
WBC: 8.2 10*3/uL (ref 4.0–10.5)
nRBC: 0 % (ref 0.0–0.2)

## 2021-06-27 LAB — BASIC METABOLIC PANEL
Anion gap: 8 (ref 5–15)
BUN: 8 mg/dL (ref 8–23)
CO2: 25 mmol/L (ref 22–32)
Calcium: 8 mg/dL — ABNORMAL LOW (ref 8.9–10.3)
Chloride: 99 mmol/L (ref 98–111)
Creatinine, Ser: 3.45 mg/dL — ABNORMAL HIGH (ref 0.44–1.00)
GFR, Estimated: 13 mL/min — ABNORMAL LOW (ref >60)
Glucose, Bld: 75 mg/dL (ref 70–99)
Potassium: 3.4 mmol/L — ABNORMAL LOW (ref 3.5–5.1)
Sodium: 132 mmol/L — ABNORMAL LOW (ref 135–145)

## 2021-06-27 LAB — GLUCOSE, CAPILLARY
Glucose-Capillary: 74 mg/dL (ref 70–99)
Glucose-Capillary: 83 mg/dL (ref 70–99)
Glucose-Capillary: 87 mg/dL (ref 70–99)

## 2021-06-27 SURGERY — ESOPHAGOGASTRODUODENOSCOPY (EGD) WITH PROPOFOL
Anesthesia: Monitor Anesthesia Care

## 2021-06-27 MED ORDER — PROPOFOL 500 MG/50ML IV EMUL
INTRAVENOUS | Status: DC | PRN
  Administered 2021-06-27: 100 ug/kg/min via INTRAVENOUS

## 2021-06-27 MED ORDER — FAMOTIDINE 40 MG PO TABS: 40.0000 mg | ORAL_TABLET | Freq: Two times a day (BID) | ORAL | Status: DC

## 2021-06-27 MED ORDER — SODIUM CHLORIDE 0.9 % IV SOLN: INTRAVENOUS | Status: DC | PRN

## 2021-06-27 MED ORDER — ALBUMIN HUMAN 5 % IV SOLN: INTRAVENOUS | Status: DC | PRN

## 2021-06-27 MED ORDER — FAMOTIDINE 20 MG PO TABS
20.0000 mg | ORAL_TABLET | Freq: Two times a day (BID) | ORAL | Status: DC
  Administered 2021-06-27 – 2021-07-01 (×8): 20 mg via ORAL
  Filled 2021-06-27 (×8): qty 1

## 2021-06-27 MED ORDER — EPHEDRINE SULFATE-NACL 50-0.9 MG/10ML-% IV SOSY
PREFILLED_SYRINGE | INTRAVENOUS | Status: DC | PRN
  Administered 2021-06-27 (×2): 10 mg via INTRAVENOUS
  Administered 2021-06-27: 12 mg via INTRAVENOUS
  Administered 2021-06-27: 10 mg via INTRAVENOUS

## 2021-06-27 MED ORDER — CHLORHEXIDINE GLUCONATE CLOTH 2 % EX PADS
6.0000 | MEDICATED_PAD | Freq: Every day | CUTANEOUS | Status: DC
  Administered 2021-06-28 – 2021-07-01 (×4): 6 via TOPICAL

## 2021-06-27 MED ORDER — SEVELAMER CARBONATE 800 MG PO TABS
800.0000 mg | ORAL_TABLET | Freq: Three times a day (TID) | ORAL | Status: DC
  Administered 2021-06-27 – 2021-07-01 (×10): 800 mg via ORAL
  Filled 2021-06-27 (×9): qty 1

## 2021-06-27 MED ORDER — PHENYLEPHRINE HCL-NACL 20-0.9 MG/250ML-% IV SOLN
INTRAVENOUS | Status: DC | PRN
  Administered 2021-06-27: 20 ug/min via INTRAVENOUS

## 2021-06-27 MED ORDER — PROPOFOL 10 MG/ML IV BOLUS
INTRAVENOUS | Status: DC | PRN
  Administered 2021-06-27: 10 mg via INTRAVENOUS

## 2021-06-27 SURGICAL SUPPLY — 15 items

## 2021-06-27 NOTE — Progress Notes (Signed)
Patient and family requested wound care be done later this morning.

## 2021-06-27 NOTE — Progress Notes (Signed)
FPTS Brief Progress Note  S: Patient sitting up, looking perkier and pinker than yesterday, eating angel food cake, saying she feels much better. She is very apologetic for the hospital delirium episode the other night. I reassured patient that this was not within her control.   O: BP (!) 85/47 (BP Location: Right Arm)   Pulse 69   Temp 97.6 F (36.4 C) (Oral)   Resp 16   Ht '5\' 2"'$  (1.575 m)   Wt 85.9 kg   SpO2 98%   BMI 34.64 kg/m     A/P: - BP low but stable, MAP ~59, acceptable. Continue to monitor. If persistently low around MAP ~50, can consider 250cc bolus first, then call CCM if no improvement. So far patient has been stable for 3 days with this BP. - Hospital delirium: if recurs, first line is haldol as she did not have a reaction to this medication and this helped the other night for severe agitation. - Orders reviewed. Labs for AM ordered, which was adjusted as needed.  - AVM cauterized today in EGD, will check hgb in AM.  Gladys Damme, MD 06/27/2021, 10:31 PM PGY-3, [facility] Night Resident  Please page [phone] with questions.

## 2021-06-27 NOTE — Anesthesia Preprocedure Evaluation (Addendum)
Anesthesia Evaluation  Patient identified by MRN, date of birth, ID band Patient awake    Reviewed: Allergy & Precautions, NPO status , Patient's Chart, lab work & pertinent test results  History of Anesthesia Complications (+) PONV, PROLONGED EMERGENCE and history of anesthetic complications  Airway Mallampati: III  TM Distance: >3 FB Neck ROM: Full    Dental  (+) Dental Advisory Given, Missing Only has 3 teeth:   Pulmonary asthma , sleep apnea and Oxygen sleep apnea ,   2-3L Home O2 at all times    Pulmonary exam normal breath sounds clear to auscultation       Cardiovascular hypertension, + CAD, + Past MI, +CHF and + DOE  + dysrhythmias Atrial Fibrillation and Supra Ventricular Tachycardia  Rhythm:Regular Rate:Normal   '22 TEE - EF 50 to 55%. Left atrial size was mild to moderately dilated. A small pericardial effusion is present. The pericardial effusion is circumferential. There is no evidence of cardiac tamponade. Trivial mitral valve  regurgitation. Mild aortic valve sclerosis is present,  with no evidence of aortic valve stenosis. There is mild (Grade II) layered plaque involving the transverse aorta  and descending aorta. Agitated saline contrast bubble study was positive with shunting observed within 3-6 cardiac cycles suggestive of interatrial shunt. There  is a small patent foramen ovale with predominantly right to left shunting across the atrial septum.   See cardiology consult    Neuro/Psych PSYCHIATRIC DISORDERS Anxiety Depression Dementia Diabetic neuropathy  Neuromuscular disease    GI/Hepatic Neg liver ROS, hiatal hernia, GERD  Controlled, Hx/o GI AVM's Melena   Endo/Other  diabetes, Poorly Controlled, Type 2, Insulin DependentHypothyroidism Hyperlipidemia Obesity   Renal/GU Dialysis and ESRFRenal disease  negative genitourinary   Musculoskeletal  (+) Arthritis , Osteoarthritis,   Gout     Abdominal (+) + obese,   Peds  Hematology  (+) Blood dyscrasia, anemia ,  On eliquis    Anesthesia Other Findings   Reproductive/Obstetrics                          Anesthesia Physical  Anesthesia Plan  ASA: 4  Anesthesia Plan: MAC   Post-op Pain Management: Minimal or no pain anticipated   Induction: Intravenous  PONV Risk Score and Plan: 3 and Propofol infusion and Treatment may vary due to age or medical condition  Airway Management Planned: Nasal Cannula and Natural Airway  Additional Equipment: None  Intra-op Plan:   Post-operative Plan:   Informed Consent: I have reviewed the patients History and Physical, chart, labs and discussed the procedure including the risks, benefits and alternatives for the proposed anesthesia with the patient or authorized representative who has indicated his/her understanding and acceptance.     Dental advisory given  Plan Discussed with: CRNA and Anesthesiologist  Anesthesia Plan Comments:         Anesthesia Quick Evaluation

## 2021-06-27 NOTE — Interval H&P Note (Signed)
History and Physical Interval Note: For upper endoscopy and ileoscopy, possible video capsule endoscopy deployment to evaluate recurrent anemia and heme positive stool in the setting of Lynch syndrome, history of intestinal angiectasia and colorectal cancer.  HIGHER THAN BASELINE RISK.The nature of the procedure, as well as the risks, benefits, and alternatives were carefully and thoroughly reviewed with the patient. Ample time for discussion and questions allowed. The patient understood, was satisfied, and agreed to proceed.      Latest Ref Rng & Units 06/27/2021    1:18 AM 06/26/2021    7:15 PM 06/26/2021    1:51 AM  CBC  WBC 4.0 - 10.5 K/uL 8.2   9.3   9.3    Hemoglobin 12.0 - 15.0 g/dL 8.7   8.7   8.4    Hematocrit 36.0 - 46.0 % 29.6   28.9   28.2    Platelets 150 - 400 K/uL 271   273   331       06/27/2021 11:14 AM  [name]  has presented today for surgery, with the diagnosis of heme positive stools, acute on chronic anemia, Lynch syndrome, history of intestinal AVMs.  The various methods of treatment have been discussed with the patient and family. After consideration of risks, benefits and other options for treatment, the patient has consented to  Procedure(s): ESOPHAGOGASTRODUODENOSCOPY (EGD) WITH PROPOFOL (N/A) ILEOSCOPY THROUGH STOMA (N/A) as a surgical intervention.  The patient's history has been reviewed, patient examined, no change in status, stable for surgery.  I have reviewed the patient's chart and labs.  Questions were answered to the patient's satisfaction.     Lajuan Lines Joziyah Roblero

## 2021-06-27 NOTE — Progress Notes (Addendum)
Subjective: Seen in room. Dialysis yesterday with minimal UF d/t hypotension. Not symptomatic. Denies cp, dyspnea. For endoscopy today.    Objective Vital signs in last 24 hours: Vitals:   06/27/21 0800 06/27/21 0850 06/27/21 0902 06/27/21 1019  BP: (!) 77/40 (!) 89/45 103/60 (!) 99/51  Pulse: 62   63  Resp: '14 16 17 11  '$ Temp: 97.9 F (36.6 C)     TempSrc: Axillary     SpO2:    100%  Weight:      Height:       Physical Exam: General: Elderly woman, chronically ill, nad  Heart: RRR no MRG Lungs: Clear bilaterally Non labored breathing, on nasal cannula.  Abdomen: soft non-tender; ileostomy bag in place  Extremities: No LE edema  Dialysis Access: Left IJ [facility]    Home meds include - tylenol, albuterol, amiodarone, apixaban, clonazepam, famotidine, fluticasone, hydromorphone, levalbuterol, metoclopramide, midodrine 10 tid, morphine IR, renavit, O2 3 L, promethazine, synthroid, cyanocobalamin        OP HD: [facility] TTS  3.5h  400/1.5  87.5kg  2/2 bath TDC  Hep 2700   - mircera 225 mcg q2, last 5/18, due 6/01 - rocaltrol 0.5 mcg tiw po - last HD 5/25, 90.1 > 89.9kg     Assessment/Plan: Sepsis - w/ chills, AMS at home.  Etiology thought to be abdominal wound, on admit high HR and soft BP's in ED. No drainable abscess per surgery. Initial blood cx with Staph epi -likely contaminant. Repeat Cx ngtd. Afebrile. WBCs trending down. Empiric antibiotics per primary team.    ESRD - on HD TTS.  Next HD 6/3.  Hypotension/ volume - Chronic hypotension. Cont midodrine 3 times daily. Euvolemic appearing. Below OP dry weight. Minimal UF on HD.  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 Aranesp 200 dosed on 6/1.  S/p ileostomy. Hx GIB. For endoscopy today.  MBD ckd - CCa in range. Phos elevated. Resume Renvela binder.  PAF - on eliquis/amiodarone    Lynnda Child PA-C [facility] 06/27/2021,12:01 PM   Labs: Basic Metabolic Panel: Recent Labs  Lab 06/24/21 1310 06/25/21 0427  06/26/21 0151 06/27/21 0118  NA  --  136 135 132*  K  --  3.2* 3.2* 3.4*  CL  --  101 101 99  CO2  --  '26 23 25  '$ GLUCOSE  --  115* 104* 75  BUN  --  '11 17 8  '$ CREATININE  --  4.22* 5.62* 3.45*  CALCIUM  --  7.9* 8.1* 8.0*  PHOS 8.1*  --   --   --     Liver Function Tests: Recent Labs  Lab 06/24/21 0039 06/24/21 0505  AST 27 24  ALT 18 13  ALKPHOS 103 77  BILITOT 0.6 0.3  PROT 5.5* 4.6*  ALBUMIN 2.2* 1.8*    No results for input(s): LIPASE, AMYLASE in the last 168 hours. No results for input(s): AMMONIA in the last 168 hours. CBC: Recent Labs  Lab 06/24/21 0039 06/24/21 0505 06/24/21 1310 06/25/21 0427 06/25/21 1331 06/26/21 0151 06/26/21 1915 06/27/21 0118  WBC 17.2* 15.7*  --  11.8*  --  9.3 9.3 8.2  NEUTROABS 14.4*  --   --  9.1*  --   --   --   --   HGB 7.5* 6.1*   < > 6.5*   < > 8.4* 8.7* 8.7*  HCT 26.7* 21.9*   < > 22.5*   < > 28.2* 28.9* 29.6*  MCV 80.7 79.9*  --  79.2*  --  81.3 81.0 82.5  PLT 566* 456*  --  335  --  331 273 271   < > = values in this interval not displayed.    Cardiac Enzymes: Recent Labs  Lab 06/26/21 0151  CKTOTAL 32*    CBG: Recent Labs  Lab 06/26/21 0806 06/26/21 1437 06/26/21 1610 06/26/21 2127 06/27/21 0831  GLUCAP 98 81 84 72 74     Studies/Results: No results found. Medications:  [MAR Hold] anticoagulant sodium citrate     [MAR Hold] DAPTOmycin (CUBICIN)  IV 400 mg (06/26/21 1944)    [MAR Hold] sodium chloride   Intravenous Once   [MAR Hold] sodium chloride   Intravenous Once   [MAR Hold] amiodarone  200 mg Oral Daily   [MAR Hold] calcitRIOL  0.5 mcg Oral Q T,Th,Sa-HD   [MAR Hold] Chlorhexidine Gluconate Cloth  6 each Topical Q0600   [MAR Hold] clonazePAM  0.5 mg Oral Once per day on Tue Thu Sat   [facility] Hold] clonazePAM  1 mg Oral Once per day on Sun Mon Wed Fri   [facility] Hold] darbepoetin (ARANESP) injection - DIALYSIS  200 mcg Intravenous Q Thu-HD   [MAR Hold] famotidine  10 mg Oral q AM   [MAR Hold]  insulin aspart  0-6 Units Subcutaneous TID WC   [MAR Hold] levothyroxine  175 mcg Oral QAC breakfast   [MAR Hold] metoCLOPramide  5 mg Oral TID AC & HS   [MAR Hold] midodrine  10 mg Oral TID   [MAR Hold] vitamin B-12  500 mcg Oral Daily

## 2021-06-27 NOTE — Transfer of Care (Signed)
Immediate Anesthesia Transfer of Care Note  Patient: ADLENE ADDUCI  Procedure(s) Performed: ESOPHAGOGASTRODUODENOSCOPY (EGD) WITH PROPOFOL ILEOSCOPY THROUGH STOMA POLYPECTOMY HEMOSTASIS CLIP PLACEMENT HOT HEMOSTASIS (ARGON PLASMA COAGULATION/BICAP) BIOPSY  Patient Location: PACU  Anesthesia Type:MAC  Level of Consciousness: awake, alert  and oriented  Airway & Oxygen Therapy: Patient Spontanous Breathing and Patient connected to nasal cannula oxygen  Post-op Assessment: Report given to RN, Post -op Vital signs reviewed and stable and Patient moving all extremities  Post vital signs: Reviewed and stable  Last Vitals:  Vitals Value Taken Time  BP 113/59 06/27/21 1234  Temp    Pulse 78 06/27/21 1235  Resp 17 06/27/21 1235  SpO2 100 % 06/27/21 1235  Vitals shown include unvalidated device data.  Last Pain:  Vitals:   06/27/21 1019  TempSrc:   PainSc: 0-No pain         Complications: No notable events documented.

## 2021-06-27 NOTE — Evaluation (Signed)
Occupational Therapy Evaluation Patient Details Name: [name] MRN: [id number] DOB: [birth year] Today's Date: 06/27/2021   History of Present Illness 81 y.o. female admitted 5/29 with confusion and hypotension. PMHx:  ESRD on HD, Lynch syndrome with hx colon cancer s/p R hemicolectomy with colostomy, hypothyroidism, asthma, GERD, T2DM, paroxysmal A fib on eliquis, CAD, HLD, gout   Clinical Impression   Pt was transferring with assist of her son and propelling her in her home prior to admission. Her son, who has autism, assisted her as needed with ADLs and IADLs. Pt presents with impaired cognition, generalized weakness and decreased standing balance. She requires min assist for bed level mobility and to stand with RW. Pt's supportive daughter is at bedside and will take pt home upon discharge. She is asking for [facility] therapies and HHRN.       Recommendations for follow up therapy are one component of a multi-disciplinary discharge planning process, led by the attending physician.  Recommendations may be updated based on patient status, additional functional criteria and insurance authorization.   Follow Up Recommendations  Home health OT    Assistance Recommended at Discharge Frequent or constant Supervision/Assistance  Patient can return home with the following A little help with walking and/or transfers;A lot of help with bathing/dressing/bathroom;Assistance with cooking/housework;Direct supervision/assist for medications management;Direct supervision/assist for financial management;Assist for transportation;Help with stairs or ramp for entrance    Functional Status Assessment  Patient has had a recent decline in their functional status and demonstrates the ability to make significant improvements in function in a reasonable and predictable amount of time.  Equipment Recommendations  None recommended by OT    Recommendations for Other Services       Precautions / Restrictions  Precautions Precautions: Fall Precaution Comments: intemittent O2 use Restrictions Weight Bearing Restrictions: No      Mobility Bed Mobility Overal bed mobility: Needs Assistance Bed Mobility: Supine to Sit     Supine to sit: Min assist Sit to supine: Min assist   General bed mobility comments: tactile and verbal cues for LEs over EOB, assist to raise trunk and for LEs back into bed    Transfers Overall transfer level: Needs assistance Equipment used: Rolling walker (2 wheels) Transfers: Sit to/from Stand Sit to Stand: Min assist           General transfer comment: light min assist to stand and take several steps toward [facility] to reposition, did not get to chair as pt going to procedure      Balance Overall balance assessment: Needs assistance   Sitting balance-Leahy Scale: Fair     Standing balance support: Bilateral upper extremity supported Standing balance-Leahy Scale: Poor Standing balance comment: RW for standing                           ADL either performed or assessed with clinical judgement   ADL Overall ADL's : Needs assistance/impaired Eating/Feeding: Set up;Sitting   Grooming: Wash/dry hands;Wash/dry face;Sitting;Supervision/safety   Upper Body Bathing: Minimal assistance;Sitting   Lower Body Bathing: Maximal assistance;Sit to/from stand   Upper Body Dressing : Minimal assistance;Sitting   Lower Body Dressing: Maximal assistance;Sit to/from stand   Toilet Transfer: Stand-pivot;Rolling walker (2 wheels);Minimal assistance                   Vision Baseline Vision/History: 1 Wears glasses Ability to See in Adequate Light: 0 Adequate       Perception  Praxis      Pertinent Vitals/Pain Pain Assessment Pain Assessment: No/denies pain     Hand Dominance Right   Extremity/Trunk Assessment Upper Extremity Assessment Upper Extremity Assessment: Generalized weakness (arthritic changes in hands)   Lower Extremity  Assessment Lower Extremity Assessment: Defer to PT evaluation   Cervical / Trunk Assessment Cervical / Trunk Assessment: Kyphotic Cervical / Trunk Exceptions: increased body habitus   Communication Communication Communication: HOH   Cognition Arousal/Alertness: Awake/alert Behavior During Therapy: WFL for tasks assessed/performed Overall Cognitive Status: Impaired/Different from baseline Area of Impairment: Memory, Orientation, Following commands, Problem solving                 Orientation Level: Disoriented to, Time, Situation, Place   Memory: Decreased short-term memory Following Commands: Follows one step commands with increased time     Problem Solving: Slow processing, Decreased initiation, Requires verbal cues, Requires tactile cues       General Comments       Exercises     Shoulder Instructions      Home Living Family/patient expects to be discharged to:: Private residence Living Arrangements: Children Available Help at Discharge: Available 24 hours/day;Family Type of Home: House Home Access: Ramped entrance     Home Layout: One level     Bathroom Shower/Tub: Occupational psychologist: Handicapped height     Home Equipment: Rollator (4 wheels);Wheelchair - manual;BSC/3in1;Other (comment)          Prior Functioning/Environment Prior Level of Function : Needs assist             Mobility Comments: primarily transferring to/from w/c with family assist, can propel self short distances ADLs Comments: Family assisting with BSC transfers, all bathing/dressing tasks.        OT Problem List: Decreased strength;Impaired balance (sitting and/or standing);Decreased knowledge of use of DME or AE;Decreased cognition      OT Treatment/Interventions: Self-care/ADL training;DME and/or AE instruction;Patient/family education;Balance training;Therapeutic activities    OT Goals(Current goals can be found in the care plan section) Acute Rehab  OT Goals OT Goal Formulation: With patient/family Time For Goal Achievement: 07/11/21 Potential to Achieve Goals: Good ADL Goals Pt Will Perform Upper Body Bathing: with supervision;sitting Pt Will Perform Lower Body Bathing: with mod assist;sit to/from stand Pt Will Perform Upper Body Dressing: with supervision;sitting Pt Will Perform Lower Body Dressing: with mod assist;sit to/from stand Pt Will Transfer to Toilet: with supervision;stand pivot transfer;bedside commode Pt Will Perform Toileting - Clothing Manipulation and hygiene: with supervision;sit to/from stand  OT Frequency: Min 2X/week    Co-evaluation              AM-PAC OT "6 Clicks" Daily Activity     Outcome Measure Help from another person eating meals?: None Help from another person taking care of personal grooming?: A Little Help from another person toileting, which includes using toliet, bedpan, or urinal?: A Little Help from another person bathing (including washing, rinsing, drying)?: A Lot Help from another person to put on and taking off regular upper body clothing?: A Little Help from another person to put on and taking off regular lower body clothing?: A Lot 6 Click Score: 17   End of Session Equipment Utilized During Treatment: Rolling walker (2 wheels)  Activity Tolerance: Patient tolerated treatment well Patient left: in bed;with call bell/phone within reach;with bed alarm set;with nursing/sitter in room  OT Visit Diagnosis: Unsteadiness on feet (R26.81);Other abnormalities of gait and mobility (R26.89);Muscle weakness (generalized) (M62.81);Other symptoms and signs  involving cognitive function                Time: 8916-9450 OT Time Calculation (min): 24 min Charges:  OT General Charges $OT Visit: 1 Visit OT Evaluation $OT Eval Moderate Complexity: 1 Mod OT Treatments $Self Care/Home Management : 8-22 mins  Nestor Lewandowsky, OTR/L Acute Rehabilitation Services Pager: [phone] Office:  [phone]   [name] 06/27/2021, 11:19 AM

## 2021-06-27 NOTE — Anesthesia Postprocedure Evaluation (Signed)
Anesthesia Post Note  Patient: RACHELLE EDWARDS  Procedure(s) Performed: ESOPHAGOGASTRODUODENOSCOPY (EGD) WITH PROPOFOL ILEOSCOPY THROUGH STOMA POLYPECTOMY HEMOSTASIS CLIP PLACEMENT HOT HEMOSTASIS (ARGON PLASMA COAGULATION/BICAP) BIOPSY     Patient location during evaluation: PACU Anesthesia Type: MAC Level of consciousness: awake and alert Pain management: pain level controlled Vital Signs Assessment: post-procedure vital signs reviewed and stable Respiratory status: spontaneous breathing and respiratory function stable Cardiovascular status: stable Postop Assessment: no apparent nausea or vomiting Anesthetic complications: no   No notable events documented.  Last Vitals:  Vitals:   06/27/21 1249 06/27/21 1300  BP: (!) 102/59 (!) 99/50  Pulse: 79 72  Resp: 16 16  Temp:    SpO2: 100% 100%    Last Pain:  Vitals:   06/27/21 1300  TempSrc:   PainSc: 0-No pain                 Merlinda Frederick

## 2021-06-27 NOTE — Op Note (Signed)
[facility] 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 Hilarie Fredrickson, MD, Grace Isaac, RN, Hinton Dyer                            Technician, Technician Referring MD:             [facility] Medicines:                Monitored Anesthesia Care Complications:            No immediate complications. Estimated Blood Loss:     Estimated blood loss: none. Procedure:                Pre-Anesthesia Assessment:                           - Prior to the procedure, a History and Physical                            was performed, and patient medications and                            allergies were reviewed. The patient's tolerance of                            previous anesthesia was also reviewed. The risks                            and benefits of the procedure and the sedation                            options and risks were discussed with the patient.                            All questions were answered, and informed consent                            was obtained. Prior Anticoagulants: The patient has  taken Eliquis (apixaban), last dose was 3 days                            prior to procedure. ASA Grade Assessment: III - A                            patient with severe systemic disease. After                            reviewing the risks and benefits, the patient was                            deemed in satisfactory condition to undergo the                            procedure.                            After I obtained informed consent, the scope was                            passed under continuously. Thedirect vision.                            Throughout the procedure, the patient's blood                            pressure, pulse, and oxygen saturations were                            monitored ileoscopy was performed without                            difficulty though peristomal manual pressure was                            used given angulation attributed to the hernia. The                            GIF-H190 ([id number] Olympus endoscope was introduced                            through the ileostomy and advanced to the 50 cm                            into the ileum. After I obtained informed consent,                            the scope was passed under continuously. Thedirect                            vision. Throughout the procedure, the patient's  blood pressure, pulse, and oxygen saturations were                            monitored ileoscopy was performed without                            difficulty though peristomal manual pressure was                            used given angulation attributed to the hernia. The                            patient tolerated the procedure well. The quality                            of the bowel preparation was good. Scope In: 11:43:03 AM Scope Out: 11:57:39 AM Scope Withdrawal Time: 0 hours 9 minutes 53 seconds  Total Procedure Duration: 0 hours 14 minutes 36 seconds  Findings:      Patient is status-post right hemicolectomy with an end ileostomy. There       is a peristomal hernia.      40 cm proximal to the stoma contained one pedunculated, polyp with       ulcerated tip. The polyp was 15 mm in diameter. The polyp was removed       with a hot snare. Resection and retrieval were complete. To prevent       bleeding after the polypectomy, one hemostatic clip was successfully       placed  (MR conditional). There was no bleeding during, or at the end, of       the procedure.      The remainder of the exam in the terminal ileum was normal. Impression:               - One ileal polyp with ulcerated tip 40 cm proximal                            to the stoma, removed with a hot snare. Resected                            and retrieved. Clip (MR conditional) was placed.                           - Remainder of the examined ileum to the stoma was                            normal. No other polyps, masses or angioectasias                            seen. Moderate Sedation:      N/A Recommendation:           - Return patient to hospital ward for ongoing care.                           - Resume previous diet.                           -  Await pathology results.                           - See EGD report (done second today). Procedure Code(s):        --- Professional ---                           [id number], Ileoscopy, through stoma; diagnostic,                            including collection of specimen(s) by brushing or                            washing, when performed (separate procedure)                           44799, Unlisted procedure, small intestine Diagnosis Code(s):        --- Professional ---                           D13.39, Benign neoplasm of other parts of small                            intestine                           D62, Acute posthemorrhagic anemia                           R19.5, Other fecal abnormalities CPT copyright 2019 American [employer]. All rights reserved. The codes documented in this report are preliminary and upon coder review may  be revised to meet current compliance requirements. Jerene Bears, MD 06/27/2021 12:26:01 PM This report has been signed electronically. Number of Addenda: 0

## 2021-06-27 NOTE — Progress Notes (Signed)
Family Medicine Teaching Service Daily Progress Note Intern Pager: [phone]  Patient name: SHONA PARDO Medical record number: [id number] Date of birth: 04-22-1940 Age: 81 y.o. Gender: female  Primary Care Provider: [name], MD Consultants: Nephrology, palliative medicine, GI Code Status: Full  Pt Overview and Major Events to Date:  5/30: Admitted  Assessment and Plan:  Nella Botsford is a 81 year old female presenting with hypotension and confusion after starting new medications and missing HD, found to be septic on admission.  Past medical history significant for ESRD on HD, T2DM, PAF on Eliquis, CAD, HLD, GERD, asthma, and Lynch syndrome c/b colon cancer s/p right hemicolectomy with ileostomy.  Sepsis likely 2/2 abdominal wound Altered mental status Patient hypotensive over past 24 hours, especially while in HD.  Patient no longer tachycardic, and no leukocytosis with WBC 8.2.  Patient appears to be responding well to antibiotic therapy.  Less somnolent; likely multifactorial in the setting of sepsis/anemia/medications and missed HD.  General surgery consulted, and advised that there is no drainable abscess.  Initial blood cultures with Staph epidermidis, skin contaminant; repeat blood cultures NGTD. - Continue IV daptomycin 400 mg every 48 hours; completed IV cefepime therapy. - Per nephrology, 250 to 500 cc fluid boluses as needed hypotension  Hypotension  Patient hypotensive at baseline.  Most recent BP 77/48.  24-hour BP: 69-87/40-57.  With improvements after HD and with midodrine compliance. - Continue home midodrine 3 times daily  Anemia of chronic disease Hgb 8.7, maintaining s/p 2 units PRBCs.  Consulted GI to assess for source of bleeding; suspected stoma.  Appreciate GI recs. -Per GI, patient to receive EGD today; awaiting recs. - Continue to monitor daily CBC - Transfusion threshold <8.  ESRD on HD Tuesday/Thursday/Saturday schedule.  K+ 3.4.  Nephrology  following; appreciate recs. - Electrolytes to be adjusted per nephrology - Continue HD as scheduled  Stage I pressure ulcer Patient with pressure wound to sacrum.  Picture below. - Covered with DuoDerm  Lynch syndrome Hx of colon cancer s/p right hemicolectomy with ileostomy Wound care per nursing staff.  Patient with normal stool without blood present in ileostomy bag.  T2DM Diet controlled. -vsSSI -CBG monitoring  PAF - Continue home amiodarone - Continue to hold home Eliquis for concerns for bleed  Chronic conditions: CAD-stable, not on any antiplatelets Anxiety-continue home Klonopin 1 mg on nondialysis days and 0.5 mg on dialysis days Hypothyroidism-continue home Synthroid 175 mcg GERD-Home famotidine decreased to 10 mg daily in light of renal function  FEN/GI: Renal PPx: SCDs Dispo:Pending PT recommendations  pending clinical improvement . Barriers include IV antibiotics.   Subjective:  Patient reports improvements to her somnolence.  Furthermore, she had an episode of delirium last night  Objective: Temp:  [97.8 F (36.6 C)-98.2 F (36.8 C)] 97.8 F (36.6 C) (06/02 0348) Pulse Rate:  [53-101] 62 (06/02 0010) Resp:  [12-19] 16 (06/02 0630) BP: (69-87)/(40-57) 77/48 (06/02 0630) SpO2:  [94 %-100 %] 95 % (06/02 0010) Weight:  [85.9 kg-87 kg] 85.9 kg (06/01 1340) Physical Exam: General: Chronically ill-appearing elderly woman lying in bed in NAD Cardiovascular: RRR, no abnormal heart sounds auscultated Respiratory: CTA x2; normal WOB on room air Abdomen: Abdominal wounds with dressing c/d/i; stoma bag with thick, but normal colored stool.  No blood present. Extremities: Nonedematous BLE Derm: Stage I pressure ulcer of sacrum    Laboratory: Recent Labs  Lab 06/26/21 0151 06/26/21 1915 06/27/21 0118  WBC 9.3 9.3 8.2  HGB 8.4* 8.7* 8.7*  HCT 28.2*  28.9* 29.6*  PLT 331 273 271   Recent Labs  Lab 06/24/21 0039 06/24/21 0505 06/25/21 0427  06/26/21 0151 06/27/21 0118  NA 134* 132* 136 135 132*  K 4.7 4.4 3.2* 3.2* 3.4*  CL 98 99 101 101 99  CO2 21* 21* '26 23 25  '$ BUN 31* 31* '11 17 8  '$ CREATININE 10.00* 9.36* 4.22* 5.62* 3.45*  CALCIUM 8.8* 8.1* 7.9* 8.1* 8.0*  PROT 5.5* 4.6*  --   --   --   BILITOT 0.6 0.3  --   --   --   ALKPHOS 103 77  --   --   --   ALT 18 13  --   --   --   AST 27 24  --   --   --   GLUCOSE 117* 157* 115* 104* 75    Imaging/Diagnostic Tests: No new images to review; pending EGD results  Rosezetta Schlatter, MD 06/27/2021, 7:34 AM PGY-1, [facility] Intern pager: [phone], text pages welcome

## 2021-06-27 NOTE — Procedures (Addendum)
I spoke to the patient in PACU and her daughter [name] by phone. We discussed the findings and treatment plan after ileoscopy with polypectomy and upper endoscopy with biopsy.  Also likely a component of gastroparesis contributing to upper abdominal pain  She we will go back to the room soon and I will follow-up the pathology results.  Time provided for questions and answers and she thanked me for the call

## 2021-06-27 NOTE — Op Note (Signed)
[facility] Patient Name: [name] Procedure Date : 06/27/2021 MRN: [id number] Attending MD: Jerene Bears , MD Date of Birth: 1940/05/03 CSN: [id number] Age: 81 Admit Type: Inpatient Procedure:                Upper GI endoscopy Indications:              Heme positive stool, recurrent blood loss acute on                            chronic anemia, Lynch syndrome with history of                            colon cancer, history of intestinal angioectasia,                            upper abdominal pain Providers:                Lajuan Lines. Hilarie Fredrickson, MD, Grace Isaac, RN, Hinton Dyer                            Technician, Technician Referring MD:             [facility] Medicines:                Monitored Anesthesia Care Complications:            No immediate complications. Estimated Blood Loss:     Estimated blood loss was minimal. Procedure:                Pre-Anesthesia Assessment:                           - Prior to the procedure, a History and Physical                            was performed, and patient medications and                            allergies were reviewed. The patient's tolerance of                            previous anesthesia was also reviewed. The risks                            and benefits of the procedure and the sedation                            options and risks were discussed with the patient.                            All questions were answered, and informed consent                            was obtained. Prior Anticoagulants: The patient has  taken Eliquis (apixaban), last dose was 3 days                            prior to procedure. ASA Grade Assessment: III - A                            patient with severe systemic disease. After                            reviewing the risks and benefits, the patient was                            deemed in satisfactory condition to undergo the                             procedure.                           After obtaining informed consent, the endoscope was                            passed under direct vision. Throughout the                            procedure, the patient's blood pressure, pulse, and                            oxygen saturations were monitored continuously. The                            GIF-H190 ([id number]) Olympus endoscope was introduced                            through the mouth, and advanced to the third part                            of duodenum. The upper GI endoscopy was                            accomplished without difficulty. The patient                            tolerated the procedure well. Scope In: Scope Out: Findings:      The examined esophagus was normal.      A 1 cm hiatal hernia was present.      Diffuse moderate inflammation characterized by congestion (edema),       erythema and granularity was found in the gastric body and in the       gastric antrum. In the antrum there was nodularity and likely small (< 1       cm) hyperplastic polyps. Biopsies were taken with a cold forceps for       histology and Helicobacter pylori testing (Jar 2 = antrum; Jar 3 =       gastric body).      A single  3 mm angioectasia with bleeding on contact was found in the       second portion of the duodenum. Fulguration to stop the bleeding by       argon plasma at 0.5 liters/minute and 20 watts was successful.      The exam of the duodenum was otherwise normal. Impression:               - Normal esophagus.                           - 1 cm hiatal hernia.                           - Diffuse gastritis without ulceration, with                            nodularity in antrum. Non-bleeding. Biopsied.                           - A single angioectasia in the duodenum. Treated                            with argon plasma coagulation (APC). Moderate Sedation:      N/A Recommendation:           - Written discharge instructions were  provided to                            the patient.                           - Return patient to hospital ward for ongoing care.                           - Advance diet as tolerated.                           - Continue present medications.                           - If Hgb stable tomorrow can restart                            anticoagulation with observation. Replace iron as                            needed per nephrology protocol.                           - Await pathology results.                           - BID PPI.                           - I did not deploy the video capsule endoscope due  to concern it may not be able to pass the immediate                            pre-stomal ileum. The parastomal hernia causes                            significant angulation in the distal most ileum. Procedure Code(s):        --- Professional ---                           [phone], 42, Esophagogastroduodenoscopy, flexible,                            transoral; with control of bleeding, any method                           43239, Esophagogastroduodenoscopy, flexible,                            transoral; with biopsy, single or multiple Diagnosis Code(s):        --- Professional ---                           K44.9, Diaphragmatic hernia without obstruction or                            gangrene                           K29.70, Gastritis, unspecified, without bleeding                           K31.819, Angiodysplasia of stomach and duodenum                            without bleeding                           R19.5, Other fecal abnormalities CPT copyright 2019 American Medical Association. All rights reserved. The codes documented in this report are preliminary and upon coder review may  be revised to meet current compliance requirements. Jerene Bears, MD 06/27/2021 12:35:50 PM This report has been signed electronically. Number of Addenda: 0

## 2021-06-27 NOTE — Care Management Important Message (Signed)
Important Message  Patient Details  Name: [name] MRN: [id number] Date of Birth: 13-Apr-1940   Medicare Important Message Given:  Yes     [name] 06/27/2021, 2:58 PM

## 2021-06-27 NOTE — Anesthesia Procedure Notes (Signed)
Procedure Name: MAC Date/Time: 06/27/2021 11:30 AM Performed by: Leonor Liv, CRNA Pre-anesthesia Checklist: Patient identified, Emergency Drugs available, Suction available and Patient being monitored Patient Re-evaluated:Patient Re-evaluated prior to induction Oxygen Delivery Method: Nasal cannula

## 2021-06-28 DIAGNOSIS — Z7189 Other specified counseling: Secondary | ICD-10-CM | POA: Diagnosis not present

## 2021-06-28 DIAGNOSIS — Z1509 Genetic susceptibility to other malignant neoplasm: Secondary | ICD-10-CM | POA: Diagnosis not present

## 2021-06-28 DIAGNOSIS — K31811 Angiodysplasia of stomach and duodenum with bleeding: Secondary | ICD-10-CM | POA: Diagnosis not present

## 2021-06-28 DIAGNOSIS — Z515 Encounter for palliative care: Secondary | ICD-10-CM | POA: Diagnosis not present

## 2021-06-28 DIAGNOSIS — K2970 Gastritis, unspecified, without bleeding: Secondary | ICD-10-CM | POA: Diagnosis not present

## 2021-06-28 DIAGNOSIS — K921 Melena: Secondary | ICD-10-CM | POA: Diagnosis not present

## 2021-06-28 DIAGNOSIS — A419 Sepsis, unspecified organism: Secondary | ICD-10-CM | POA: Diagnosis not present

## 2021-06-28 LAB — RENAL FUNCTION PANEL
Albumin: 2.2 g/dL — ABNORMAL LOW (ref 3.5–5.0)
Anion gap: 10 (ref 5–15)
BUN: 18 mg/dL (ref 8–23)
CO2: 25 mmol/L (ref 22–32)
Calcium: 8.5 mg/dL — ABNORMAL LOW (ref 8.9–10.3)
Chloride: 99 mmol/L (ref 98–111)
Creatinine, Ser: 5.19 mg/dL — ABNORMAL HIGH (ref 0.44–1.00)
GFR, Estimated: 8 mL/min — ABNORMAL LOW (ref >60)
Glucose, Bld: 130 mg/dL — ABNORMAL HIGH (ref 70–99)
Phosphorus: 4.8 mg/dL — ABNORMAL HIGH (ref 2.5–4.6)
Potassium: 3.5 mmol/L (ref 3.5–5.1)
Sodium: 134 mmol/L — ABNORMAL LOW (ref 135–145)

## 2021-06-28 LAB — BASIC METABOLIC PANEL
Anion gap: 10 (ref 5–15)
BUN: 17 mg/dL (ref 8–23)
CO2: 23 mmol/L (ref 22–32)
Calcium: 8.6 mg/dL — ABNORMAL LOW (ref 8.9–10.3)
Chloride: 102 mmol/L (ref 98–111)
Creatinine, Ser: 5.2 mg/dL — ABNORMAL HIGH (ref 0.44–1.00)
GFR, Estimated: 8 mL/min — ABNORMAL LOW (ref >60)
Glucose, Bld: 127 mg/dL — ABNORMAL HIGH (ref 70–99)
Potassium: 4.2 mmol/L (ref 3.5–5.1)
Sodium: 135 mmol/L (ref 135–145)

## 2021-06-28 LAB — CBC
HCT: 29.9 % — ABNORMAL LOW (ref 36.0–46.0)
Hemoglobin: 8.8 g/dL — ABNORMAL LOW (ref 12.0–15.0)
MCH: 24.6 pg — ABNORMAL LOW (ref 26.0–34.0)
MCHC: 29.4 g/dL — ABNORMAL LOW (ref 30.0–36.0)
MCV: 83.5 fL (ref 80.0–100.0)
Platelets: 262 10*3/uL (ref 150–400)
RBC: 3.58 MIL/uL — ABNORMAL LOW (ref 3.87–5.11)
RDW: 19.9 % — ABNORMAL HIGH (ref 11.5–15.5)
WBC: 9.7 10*3/uL (ref 4.0–10.5)
nRBC: 0 % (ref 0.0–0.2)

## 2021-06-28 LAB — GLUCOSE, CAPILLARY: Glucose-Capillary: 109 mg/dL — ABNORMAL HIGH (ref 70–99)

## 2021-06-28 MED ORDER — APIXABAN 2.5 MG PO TABS
2.5000 mg | ORAL_TABLET | Freq: Two times a day (BID) | ORAL | Status: DC
  Administered 2021-06-28 – 2021-07-01 (×6): 2.5 mg via ORAL
  Filled 2021-06-28 (×5): qty 1

## 2021-06-28 MED ORDER — ALTEPLASE 2 MG IJ SOLR: 4.0000 mg | Freq: Once | INTRAMUSCULAR | Status: AC

## 2021-06-28 MED ORDER — ALTEPLASE 2 MG IJ SOLR
INTRAMUSCULAR | Status: AC
  Filled 2021-06-28: qty 4

## 2021-06-28 MED ORDER — CALCITRIOL 0.5 MCG PO CAPS
ORAL_CAPSULE | ORAL | Status: AC
  Filled 2021-06-28: qty 1

## 2021-06-28 MED ORDER — ALTEPLASE 2 MG IJ SOLR
INTRAMUSCULAR | Status: AC
  Administered 2021-06-28: 4 mg
  Filled 2021-06-28: qty 4

## 2021-06-28 NOTE — Progress Notes (Addendum)
Family Medicine Teaching Service Daily Progress Note Intern Pager: [phone]  Patient name: [name] Medical record number: [id number] Date of birth: 01-Sep-1940 Age: 81 y.o. Gender: female  Primary Care Provider: [name], MD Consultants: nephrology, palliative care, GI   Code Status: Full Code  Pt Overview and Major Events to Date:  5/30 - admitted for sepsis, received cefepime, linezolid, metronidazole; transfused 1 u pRBC 5/31 - transfused 1 u pRBC, antibiotics transitioned to cefepime and daptomycin 6/1 - cefepime discontinued, remains on daptomycin 6/2 - EGD with evidence of gastritis and duodenal angiectasia s/p APC  Assessment and Plan: [name] is a 81 y.o. female who presents with hypertension and altered mental status secondary to sepsis due to abdominal wound. PMHx significant for: ESRD on HD, T2DM, PAF on Eliquis, CAD, HLD, GERD, asthma, and Lynch syndrome c/b colon cancer s/p right hemicolectomy with ileostomy.  Sepsis secondary to abdominal wound Improving, no longer meeting sepsis criteria.  Mental status at baseline. Will plan for 7-day total course for now to treat cellulitis. MRSE in 1 set of blood cx suspect is contaminant. - continue IV daptomycin, day 5 of antibiotics  Chronic hypotension Remains hypotensive with MAP mostly in the 60s. - continue midodrine 10 mg TID - can given 250-500 cc IV fluid bolus as needed  Acute on chronic anemia Hemoglobin stable this morning 8.8. S/p EGD yesterday with finding of duodenal angiectasia s/p APC. - GI following, appreciate recommendations - monitor CBC, transfuse for Hgb < 8 - plan to resume anticoagulation today  ESRD on TTS HD HD today. - HD per nephrology  Stage I pressure ulcer - Duoderm  Lynch syndrome with history of colon cancer s/p hemicolectomy with ileostomy - wound care per nursing  T2DM Diet controlled. - vsSSI - CBG monitoring  PAF NSR on exam. - continue amiodarone -  resume home apixaban 2.5 mg BID  Chronic conditions: CAD-stable, not on any antiplatelets Anxiety-continue home clonazepam 1 mg on nondialysis days and 0.5 mg on dialysis days Hypothyroidism-continue home levothyroxine 175 mcg GERD-Home famotidine decreased to 10 mg daily in light of renal function  FEN/GI: renal diet PPx: apixaban  Disposition: home with services likely 6/5  Subjective:  NAOE.  Seen at HD.  No concerns expressed this morning.  Feels back to her normal self.  Denies any blood in her ostomy bag.  Daughter seen in patient room separately.  Updates provided, all questions answered.  Objective: Temp:  [97.4 F (36.3 C)-98 F (36.7 C)] 97.6 F (36.4 C) (06/03 0930) Pulse Rate:  [57-81] 57 (06/03 1015) Resp:  [15-21] 15 (06/03 1015) BP: (81-116)/(45-61) 83/48 (06/03 0942) SpO2:  [97 %-100 %] 100 % (06/03 1015) Physical Exam: General: Alert, conversant, NAD Cardiovascular: RRR, no murmurs Respiratory: Clear to auscultation bilaterally, no respiratory distress Abdomen: Soft, mild diffuse tenderness.  Ileostomy bag with soft dark brown stool. Extremities: Warm and well perfused, no edema  Laboratory: Recent Labs  Lab 06/26/21 1915 06/27/21 0118 06/28/21 0607  WBC 9.3 8.2 9.7  HGB 8.7* 8.7* 8.8*  HCT 28.9* 29.6* 29.9*  PLT 273 271 262   Recent Labs  Lab 06/24/21 0039 06/24/21 0505 06/25/21 0427 06/27/21 0118 06/28/21 0607 06/28/21 0935  NA 134* 132*   < > 132* 135 134*  K 4.7 4.4   < > 3.4* 4.2 3.5  CL 98 99   < > 99 102 99  CO2 21* 21*   < > '25 23 25  '$ BUN 31* 31*   < >  $'8 17 18  'N$ CREATININE 10.00* 9.36*   < > 3.45* 5.20* 5.19*  CALCIUM 8.8* 8.1*   < > 8.0* 8.6* 8.5*  PROT 5.5* 4.6*  --   --   --   --   BILITOT 0.6 0.3  --   --   --   --   ALKPHOS 103 77  --   --   --   --   ALT 18 13  --   --   --   --   AST 27 24  --   --   --   --   GLUCOSE 117* 157*   < > 75 127* 130*   < > = values in this interval not displayed.     Imaging/Diagnostic  Tests: No results found.   Zola Button, MD 06/28/2021, 10:43 AM PGY-2, [place] Intern pager: [phone], text pages welcome

## 2021-06-28 NOTE — Progress Notes (Signed)
Pharmacy Antibiotic Note  [name] is a 81 y.o. female admitted on 06/23/2021 with  sepsis due to an abdominal wound .  Pharmacy has been consulted for daptomycin dosing.  Patient presented with hypotension and intermittent confusion over previous few days, along with a worsening abdominal wound. She had been prescribed doxycycline and metronidazole for this outpatient.   CK on 6/1 was 32. Patient has been tolerating HD sessions and is currently on schedule HD TTS. WBC trended down and is stable and afebrile.  Plan: Continue daptomycin '6mg'$ /kg IV q48h (using AdjBW for BMI >30 starting 6/1) Check CK at baseline then weekly on Wednesdays Monitor labs, vitals, and cultures   Height: '5\' 2"'$  (157.5 cm) Weight: 85.9 kg (189 lb 6 oz) IBW/kg (Calculated) : 50.1  Temp (24hrs), Avg:97.7 F (36.5 C), Min:97.4 F (36.3 C), Max:98 F (36.7 C)  Recent Labs  Lab 06/24/21 0039 06/24/21 0505 06/24/21 0804 06/24/21 1600 06/25/21 0427 06/26/21 0151 06/26/21 1915 06/27/21 0118 06/28/21 0607  WBC 17.2* 15.7*  --   --  11.8* 9.3 9.3 8.2 9.7  CREATININE 10.00* 9.36*  --   --  4.22* 5.62*  --  3.45* 5.20*  LATICACIDVEN 3.6* 2.6* 2.5* 2.6* 1.8  --   --   --   --      Estimated Creatinine Clearance: 9.1 mL/min (A) (by C-G formula based on SCr of 5.2 mg/dL (H)).    Allergies  Allergen Reactions   Adhesive [Tape] Itching, Swelling, Rash and Other (See Comments)    Tears skin and causes blisters also. EKG pads will cause welts   Avelox [Moxifloxacin] Swelling and Rash   Banana Anaphylaxis and Other (See Comments)    Blisters appear also   Blueberry Flavor Anaphylaxis   Cantaloupe Extract Allergy Skin Test Anaphylaxis and Other (See Comments)    Blisters appear also   Cefprozil Shortness Of Breath, Rash and Other (See Comments)    Tolerated ceftriaxone on 06/20/20   Cetacaine [Butamben-Tetracaine-Benzocaine] Nausea And Vomiting and Swelling   Dicyclomine Nausea And Vomiting and Other (See  Comments)    "Heart trouble"; Headaches and increased blood sugars   Food Anaphylaxis and Other (See Comments)    Melons- throat closes and blisters appear   Imdur [Isosorbide Nitrate] Hives, Palpitations, Other (See Comments) and Rash    Headaches also   Januvia [Sitagliptin] Shortness Of Breath   Lipitor [Atorvastatin] Shortness Of Breath   Losartan Potassium Shortness Of Breath   Nitroglycerin Other (See Comments)    Caused cardiac arrest and feels like skin bring torn off back of head    Omeprazole Shortness Of Breath and Swelling   Oxycodone Hives, Rash and Other (See Comments)    Tolerates Dilaudid    Penicillins Anaphylaxis    Has patient had a PCN reaction causing immediate rash, facial/tongue/throat swelling, SOB or lightheadedness with hypotension: Yes Has patient had a PCN reaction causing severe rash involving mucus membranes or skin necrosis: No Has patient had a PCN reaction that required hospitalization Yes Has patient had a PCN reaction occurring within the last 10 years: No   Prednisone Anaphylaxis   Vancomycin Anaphylaxis   Watermelon [Citrullus Vulgaris] Anaphylaxis and Other (See Comments)    Blisters appear also   Hydrocodone Hives and Other (See Comments)    Tolerates Dilaudid   Latex Rash and Other (See Comments)    Blisters    Tamiflu [Oseltamivir] Other (See Comments)    Contraindicated with other medications Patient on tikosyn, and tamiflu interfered  with anti arrhythmic med Contraindicated with other medications Patient on tikosyn, and tamiflu interfered with anti arrhythmic med   Feraheme [Ferumoxytol] Other (See Comments)    Sharp pain to lower back and flank   Other Other (See Comments)    Hydrogen - unknown- patient does not recall this (??)   Lasix [Furosemide] Hives, Swelling and Rash   Mupirocin Rash    Antimicrobials this admission: Daptoymycin 5/30>> Cefepime 5/30 >> 6/1 Linezolid 5/30 x1 Flagyl 5/30 x1  Microbiology results: 5/30:  BCID 1/2 MRSE (likely contaminant)   Thank you for allowing pharmacy to be a part of this patient's care.  Varney Daily, PharmD PGY1 Pharmacy Resident 06/28/2021 7:56 AM  Please check AMION for all [facility] pharmacy phone numbers After 10:00 PM call main pharmacy [phone]

## 2021-06-28 NOTE — Progress Notes (Signed)
[facility] Progress Note  CC:  Anemia   Subjective: She is currently in dialysis.  No nausea or vomiting.  No significant abdominal pain at this time.  No bloody output from her ostomy.  She complains of having some difficulty swallowing which is not a new issue.  Foods such as pound cake gets stuck in her upper esophagus, she drinks water and the stuck food passes.  I discussed with her her EGD yesterday showed a normal esophagus.    Objective:  Vital signs in last 24 hours: Temp:  [97.4 F (36.3 C)-98 F (36.7 C)] 97.6 F (36.4 C) (06/03 0930) Pulse Rate:  [63-81] 66 (06/03 0000) Resp:  [11-21] 20 (06/03 0942) BP: (81-116)/(45-61) 83/48 (06/03 0942) SpO2:  [97 %-100 %] 97 % (06/03 0930) Last BM Date : 06/26/21 General: Alert 81 year old female in no acute distress Heart: Regular rate and rhythm, no murmurs Pulm: Breath sounds clear, decreased in the bases. Abdomen: Obese abdomen, nontender.  LLQ ostomy intact with a moderate amount of brown watery liquid. No red blood.   Parastomal hernia.  Positive bowel sounds all 4 quadrants Extremities:  Without edema. Neurologic:  Alert and  oriented x 4. Grossly normal neurologically. Psych:  Alert and cooperative. Normal mood and affect.  Intake/Output from previous day: 06/02 0701 - 06/03 0700 In: 708 [P.O.:150; I.V.:250; IV Piggyback:308] Out: -  Intake/Output this shift: Total I/O In: -  Out: 100 [Stool:100]  Lab Results: Recent Labs    06/26/21 1915 06/27/21 0118 06/28/21 0607  WBC 9.3 8.2 9.7  HGB 8.7* 8.7* 8.8*  HCT 28.9* 29.6* 29.9*  PLT 273 271 262   BMET Recent Labs    06/26/21 0151 06/27/21 0118 06/28/21 0607  NA 135 132* 135  K 3.2* 3.4* 4.2  CL 101 99 102  CO2 '23 25 23  '$ GLUCOSE 104* 75 127*  BUN '17 8 17  '$ CREATININE 5.62* 3.45* 5.20*  CALCIUM 8.1* 8.0* 8.6*   LFT No results for input(s): PROT, ALBUMIN, AST, ALT, ALKPHOS, BILITOT, BILIDIR, IBILI in the last 72 hours. PT/INR No  results for input(s): LABPROT, INR in the last 72 hours. Hepatitis Panel No results for input(s): HEPBSAG, HCVAB, HEPAIGM, HEPBIGM in the last 72 hours.  No results found.  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 S/P ileoscopy 01/2021 showed single AVM in the ileum treated with APC.  Colonoscopy 01/2021 showed diverticulosis sigmoid colon, colonoscopy was incomplete secondary colon fixation likely due to adhesions. Hemoglobin 7.5 (baseline Hg 10 -11) -> 6.0 transfused with 1 unit of PRBC -> posttransfusion Hg 8.0 -> Hg 6.5 on 5/31 transfused 1 units of PRBCs -> Hg 8.5 -> 8.7 -> today Hg 8.8. CTAP showed a large parastomal hernia containing bowel and abdominal fat without obstruction. S/P EGD 6/1 identified diffuse gastritis without ulceration, a 1 cm hiatal hernia and a single AVM in the duodenum treated with APC. S/P colonoscopy 6/1 identified a 38m ileal polyp with an ulcerated tip at 40 cm proximal to the stoma which was removed, the remainder of the examined ileum and stoma were normal. -Diet as tolerated -Okay to restart anticoagulation today, monitor closely for GI bleeding -IV iron per nephrology -Continue PPI twice daily -Transfuse for hemoglobin less than 7 -Further recommendations per Dr. PHilarie Fredrickson  2)  Chronic LLQ wound, evaluated by general surgery, surgical debridement not warranted.  CTAP showed a skin defect in the left lateral aspect of the lower abdominal wall fat panniculus measuring 2 cm in diameter but only 3-4 mm in depth, possible mild underlying cellulitis without abscess.    3) Afib on Eliquis.    4) ESRD on HD. Cr 5.20.  5) Dysphagia, chronic/intermittent.  EGD 06/27/2021 showed a normal esophagus. -Consider eventual barium swallow study    Principal  Problem:   Sepsis ([place]) Active Problems:   Melena   Acute on chronic anemia   Angiodysplasia of duodenum with hemorrhage   Gastritis without bleeding     LOS: 4 days   Noralyn Pick  06/28/2021, 10:03 AM

## 2021-06-28 NOTE — Progress Notes (Signed)
Unable to obtain.  Scale on bed inaccurate.  Patient unable to stand.

## 2021-06-28 NOTE — Progress Notes (Signed)
   Palliative Medicine Inpatient Follow Up Note   HPI: 81 y.o. 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    Patient missed HD on Saturday due to not feeling well.  Patient septic likely secondary to abdominal wound.  Given patient's multiple chronic comorbidities, PMT has been consulted to assist with goals of care conversation.  Today's Discussion 06/28/2021  *Please note that this is a verbal dictation therefore any spelling or grammatical errors are due to the "[place] [employer]" system interpretation.  Chart reviewed inclusive of vital signs, progress notes, laboratory results, and diagnostic images.   I met with [name] at bedside.  Gearldene was coming back from hemodialysis.  She is noted to be in no distress and fully coherent upon assessment.  She denies pain, nausea, shortness of breath.  Overall Deannah is feeling improved.  Per conversation with [name], she is not interested in outpatient palliative care upon discharge.  She is hopeful to get additional CNA support through home health though otherwise does not feel outpatient palliative care was greatly helpful.   Solidify goals for full code, full scope of treatment and ongoing dialysis treatment.  Patient is clear about not being ready to stop these interventions.  Questions and concerns answered, palliative support provided.  Vital Signs Vitals:   06/28/21 1315 06/28/21 1347  BP: (!) 93/50 (!) 95/53  Pulse: (!) 56 (!) 53  Resp: 18 14  Temp:  98.4  F (36.9 C)  SpO2: 100%     Intake/Output Summary (Last 24 hours) at 06/28/2021 1412 Last data filed at 06/28/2021 1343 Gross per 24 hour  Intake 208 ml  Output 100.5 ml  Net 107.5 ml    Last Weight  Most recent update: 06/28/2021 10:58 AM    Weight  97.5 kg (215 lb)            Gen: Elderly Caucasian female in no acute distress HEENT: Dry mucous membranes CV: Irregular rate and rhythm PULM: On 2 L nasal cannula breathing is even and nonlabored ABD: Colostomy in place EXT: No edema Neuro: Alert to self when awakened  SUMMARY OF RECOMMENDATIONS   -Full code/full scope of treatment -Patient is not ready for end-of-life or hospice at this time --> does not desire outpatient palliative support -MOST form retrieved from hospice of [facility] and scanned into media section of chart -It is very important for patient's daughter to have good communication and updates -Psychosocial and emotional support provided -PMT will continue to follow ______________________________________________________________________________________ [place] Team Team Cell Phone: [phone] Please utilize secure chat with additional questions, if there is no response within 30 minutes please call the above phone number  Palliative Medicine Team providers are available by phone from 7am to 7pm daily and can be reached through the team cell phone.  Should this patient require assistance outside of these hours, please call the patient's attending physician.

## 2021-06-28 NOTE — Progress Notes (Signed)
Arterial venous pressures high upon initiation of dialysis.  Lines powered flushed and reversed.  Arterial pressures remained high.  BFR reduced to 200.  Dr. Jonnie Finner notified and made aware.

## 2021-06-28 NOTE — Progress Notes (Signed)
Subjective:  Seen in room. Daughter at bedside. BPs remain soft. Trying to eat more, has some abd pain. Denies cp, dyspnea.    Objective Vital signs in last 24 hours: Vitals:   06/28/21 0400 06/28/21 0800 06/28/21 0930 06/28/21 0942  BP:  (!) 81/60 (!) 81/52 (!) 83/48  Pulse:      Resp:  17 (!) 21 20  Temp:  98 F (36.7 C) 97.6 F (36.4 C)   TempSrc:  Oral Oral   SpO2: 98% 98% 97%   Weight:      Height:       Physical Exam: General: Elderly woman, chronically ill, nad  Heart: RRR no MRG Lungs: Clear bilaterally Non labored breathing, on nasal cannula.  Abdomen: soft non-tender; ileostomy bag in place  Extremities: No LE edema  Dialysis Access: Left IJ [facility]    Home meds include - tylenol, albuterol, amiodarone, apixaban, clonazepam, famotidine, fluticasone, hydromorphone, levalbuterol, metoclopramide, midodrine 10 tid, morphine IR, renavit, O2 3 L, promethazine, synthroid, cyanocobalamin        OP HD: [facility] TTS  3.5h  400/1.5  87.5kg  2/2 bath TDC  Hep 2700   - mircera 225 mcg q2, last 5/18, due 6/01 - rocaltrol 0.5 mcg tiw po - last HD 5/25, 90.1 > 89.9kg     Assessment/Plan: Sepsis - w/ chills, AMS at home.  Etiology thought to be abdominal wound, on admit high HR and soft BP's in ED. No drainable abscess per surgery. Initial blood cx with Staph epi -likely contaminant. Repeat Cx ngtd. Afebrile. WBCs trending down. Empiric antibiotics per primary team.    ESRD - on HD TTS.  HD today.  Hypotension/ volume - Chronic hypotension. Cont midodrine 3 times daily. Euvolemic appearing. Below OP dry weight. Minimal UF on HD.  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 Aranesp 200 dosed on 6/1.  S/p ileostomy. Hx GIB. Upper endoscopy 6/2 -single AVM treated with APC  MBD ckd - CCa in range. Phos elevated. Resume Renvela binder.  PAF - on amiodarone. No AC currently    Lynnda Child PA-C [facility] 06/28/2021,9:57 AM   Labs: Basic Metabolic Panel: Recent Labs  Lab  06/24/21 1310 06/25/21 0427 06/26/21 0151 06/27/21 0118 06/28/21 0607  NA  --    < > 135 132* 135  K  --    < > 3.2* 3.4* 4.2  CL  --    < > 101 99 102  CO2  --    < > '23 25 23  '$ GLUCOSE  --    < > 104* 75 127*  BUN  --    < > '17 8 17  '$ CREATININE  --    < > 5.62* 3.45* 5.20*  CALCIUM  --    < > 8.1* 8.0* 8.6*  PHOS 8.1*  --   --   --   --    < > = values in this interval not displayed.    Liver Function Tests: Recent Labs  Lab 06/24/21 0039 06/24/21 0505  AST 27 24  ALT 18 13  ALKPHOS 103 77  BILITOT 0.6 0.3  PROT 5.5* 4.6*  ALBUMIN 2.2* 1.8*    No results for input(s): LIPASE, AMYLASE in the last 168 hours. No results for input(s): AMMONIA in the last 168 hours. CBC: Recent Labs  Lab 06/24/21 0039 06/24/21 0505 06/25/21 0427 06/25/21 1331 06/26/21 0151 06/26/21 1915 06/27/21 0118 06/28/21 0607  WBC 17.2*   < > 11.8*  --  9.3  9.3 8.2 9.7  NEUTROABS 14.4*  --  9.1*  --   --   --   --   --   HGB 7.5*   < > 6.5*   < > 8.4* 8.7* 8.7* 8.8*  HCT 26.7*   < > 22.5*   < > 28.2* 28.9* 29.6* 29.9*  MCV 80.7   < > 79.2*  --  81.3 81.0 82.5 83.5  PLT 566*   < > 335  --  331 273 271 262   < > = values in this interval not displayed.    Cardiac Enzymes: Recent Labs  Lab 06/26/21 0151  CKTOTAL 32*    CBG: Recent Labs  Lab 06/26/21 1610 06/26/21 2127 06/27/21 0831 06/27/21 1246 06/27/21 1604  GLUCAP 84 72 74 83 87     Studies/Results: No results found. Medications:  DAPTOmycin (CUBICIN)  IV Stopped (06/26/21 2014)    amiodarone  200 mg Oral Daily   calcitRIOL  0.5 mcg Oral Q T,Th,Sa-HD   Chlorhexidine Gluconate Cloth  6 each Topical Q0600   clonazePAM  0.5 mg Oral Once per day on Tue Thu Sat   clonazePAM  1 mg Oral Once per day on Sun Mon Wed Fri   darbepoetin (ARANESP) injection - DIALYSIS  200 mcg Intravenous Q Thu-HD   famotidine  20 mg Oral Q12H   insulin aspart  0-6 Units Subcutaneous TID WC   levothyroxine  175 mcg Oral QAC breakfast    metoCLOPramide  5 mg Oral TID AC & HS   midodrine  10 mg Oral TID   sevelamer carbonate  800 mg Oral TID WC   vitamin B-12  500 mcg Oral Daily

## 2021-06-28 NOTE — Progress Notes (Signed)
FPTS Interim Night Progress Note  S:Patient sleeping comfortably.  Rounded with primary night RN.  No concerns voiced.  No orders required.    O: Today's Vitals   06/28/21 1641 06/28/21 2005 06/29/21 0000 06/29/21 0140  BP: (!) 95/59 (!) 87/41 (!) 70/39 (!) 71/42  Pulse: 61 60 60 (!) 58  Resp: '19 18 15 14  '$ Temp: 97.9 F (36.6 C) 98.9 F (37.2 C)    TempSrc: Oral Oral    SpO2:  100% 100% 100%  Weight:      Height:      PainSc:  0-No pain 0-No pain 0-No pain      A/P: Continue current management Remains hypotensive but asymptomatic. Currently on Midodrine 10 mg TID   Carollee Leitz MD PGY-3, [place] Medicine Service pager [phone]

## 2021-06-29 DIAGNOSIS — K31811 Angiodysplasia of stomach and duodenum with bleeding: Secondary | ICD-10-CM | POA: Diagnosis not present

## 2021-06-29 DIAGNOSIS — K2970 Gastritis, unspecified, without bleeding: Secondary | ICD-10-CM | POA: Diagnosis not present

## 2021-06-29 DIAGNOSIS — A419 Sepsis, unspecified organism: Secondary | ICD-10-CM | POA: Diagnosis not present

## 2021-06-29 LAB — CULTURE, BLOOD (ROUTINE X 2)
Culture: NO GROWTH
Special Requests: ADEQUATE

## 2021-06-29 LAB — GLUCOSE, CAPILLARY
Glucose-Capillary: 126 mg/dL — ABNORMAL HIGH (ref 70–99)
Glucose-Capillary: 125 mg/dL — ABNORMAL HIGH (ref 70–99)
Glucose-Capillary: 182 mg/dL — ABNORMAL HIGH (ref 70–99)

## 2021-06-29 NOTE — Progress Notes (Signed)
Family Medicine Teaching Service Daily Progress Note Intern Pager: [phone]  Patient name: [name] Medical record number: [id number] Date of birth: 1940/10/10 Age: 81 y.o. Gender: female  Primary Care Provider: [name], MD Consultants: Palliative medicine, GI, nephrology Code Status: Full  Pt Overview and Major Events to Date:  5/30-admitted for sepsis, received cefepime, linezolid, metronidazole; transfuse 1 unit PRBC 5/31-transfuse 1 unit PRBC, antibiotics transition to cefepime and daptomycin 6/1-3 days cefepime complete; remains on daptomycin 6/2-EGD with evidence of gastritis and duodenal angiectasia s/p APC  Assessment and Plan: [name] is a 81 year old female who presented with hypotension and confusion 2/2 sepsis in the setting of abdominal wound.  Past medical history significant for ESRD on HD, T2DM, PAF on Eliquis, CAD, HLD, GERD, asthma, and Lynch syndrome c/b colon cancer s/p right hemicolectomy with ileostomy.  Sepsis in the setting of abdominal wound, resolved Cellulitis surrounding abdominal wound, improved Patient no longer meeting sepsis criteria.  Mental status at baseline.  MRSE in 1 set of blood cultures suspected to be contaminant. -Continue IV daptomycin, to be completed on Monday (dose 7/7).  Chronic hypotension Remains hypotensive with most recent BP 95/50, and 24-hour BP: 70-97/39-77. - Continue midodrine 10 mg 3 times daily - Can give 250 to 500 cc IV fluid bolus as needed  Acute on chronic anemia Stable.  Hgb 8.8.  Status post EGD which revealed duodenal angiectasia s/p APC. GI following, appreciate recs. - Continue to monitor daily CBC - Transfusion threshold Hgb <8  ESRD on HD Tuesday/Thursday/Saturday schedule. - Continue HD as scheduled; daughter requests to have HD on Tuesday prior to discharge.  Stage I pressure ulcer - Continue DuoDERM dressing  History of Lynch syndrome c/b colon cancer s/p hemicolectomy with  ileostomy -Wound care per nursing  T2DM Diet controlled.  CBG since admission no greater than 110.  Patient requires no treatment at home since hemicolectomy. - Discontinue SSI and CBG monitoring  PAF - Continue home amiodarone -Continue home Eliquis 2.5 mg twice daily  Chronic conditions: CAD-stable, not on any antiplatelets Anxiety-continue home clonazepam 1 mg on  non-HD days and 0.5 mg on HD days Hypothyroidism-continue home levothyroxine 175 mcg GERD-continue home famotidine decreased to 10 mg in light of renal function  FEN/GI: Renal PPx: Eliquis Dispo:Home with home health   6/6 . Barriers include HD.   Subjective:  Patient reports a difficult night last night, as her ostomy bag began to leak stool and had not been changed since.  Otherwise, she denies acute concerns or complaints.  Daughter, present in the room, provided with updates and had all questions answered.  Objective: Temp:  [97.5 F (36.4 C)-98.9 F (37.2 C)] 97.7 F (36.5 C) (06/04 0746) Pulse Rate:  [51-61] 60 (06/04 0400) Resp:  [14-19] 15 (06/04 0400) BP: (70-100)/(39-77) 95/50 (06/04 0400) SpO2:  [97 %-100 %] 100 % (06/04 0400) Physical Exam: General: Chronically ill-appearing, obese, elderly woman, lying in bed with [place] in place, alert in NAD Cardiovascular: RRR on monitoring.   Respiratory: Normal WOB on 3L [place] ([place] in place for comfort, not because patient was hypoxic) Abdomen: Mild, diffuse tenderness.  Ileostomy bag with brown stool and leaking from the top.  LLQ wound with saturated bandage. Extremities: Warm and well-perfused.  Nonedematous BLE.  Laboratory: Recent Labs  Lab 06/26/21 1915 06/27/21 0118 06/28/21 0607  WBC 9.3 8.2 9.7  HGB 8.7* 8.7* 8.8*  HCT 28.9* 29.6* 29.9*  PLT 273 271 262   Recent Labs  Lab 06/24/21 0039  06/24/21 0505 06/25/21 0427 06/27/21 0118 06/28/21 0607 06/28/21 0935  NA 134* 132*   < > 132* 135 134*  K 4.7 4.4   < > 3.4* 4.2 3.5  CL 98 99   < > 99 102  99  CO2 21* 21*   < > '25 23 25  '$ BUN 31* 31*   < > '8 17 18  '$ CREATININE 10.00* 9.36*   < > 3.45* 5.20* 5.19*  CALCIUM 8.8* 8.1*   < > 8.0* 8.6* 8.5*  PROT 5.5* 4.6*  --   --   --   --   BILITOT 0.6 0.3  --   --   --   --   ALKPHOS 103 77  --   --   --   --   ALT 18 13  --   --   --   --   AST 27 24  --   --   --   --   GLUCOSE 117* 157*   < > 75 127* 130*   < > = values in this interval not displayed.     Imaging/Diagnostic Tests: No new imaging to review  Rosezetta Schlatter, MD 06/29/2021, 11:24 AM PGY-1, [place] Intern pager: [phone], text pages welcome

## 2021-06-29 NOTE — Plan of Care (Signed)
  Problem: Clinical Measurements: Goal: Will remain free from infection Outcome: Progressing Goal: Diagnostic test results will improve Outcome: Progressing   Problem: Education: Goal: Knowledge of General Education information will improve Description: Including pain rating scale, medication(s)/side effects and non-pharmacologic comfort measures Outcome: Progressing   Problem: Health Behavior/Discharge Planning: Goal: Ability to manage health-related needs will improve Outcome: Progressing   Problem: Activity: Goal: Risk for activity intolerance will decrease Outcome: Progressing   Problem: Nutrition: Goal: Adequate nutrition will be maintained Outcome: Progressing   Problem: Coping: Goal: Level of anxiety will decrease Outcome: Progressing   Problem: Pain Managment: Goal: General experience of comfort will improve Outcome: Progressing   Problem: Safety: Goal: Ability to remain free from injury will improve Outcome: Progressing   Problem: Skin Integrity: Goal: Risk for impaired skin integrity will decrease Outcome: Progressing

## 2021-06-29 NOTE — Progress Notes (Signed)
OT Cancellation Note  Patient Details Name: JERUSHA REISING MRN: [id number] DOB: 27-Apr-1940   Cancelled Treatment:    Reason Eval/Treat Not Completed: Other (comment): Pt sleeping deeply. Pt's daughter in room and declined OT this afternoon due to pt having  a hard morning with ostomy and skin needs and had something for pain making her tired. Pt did not wake through conversation.  Will continue efforts.   Julien Girt 06/29/2021, 3:16 PM

## 2021-06-29 NOTE — Progress Notes (Signed)
Subjective:   Completed dialysis yesterday. Minimal UF. Having a rough morning, ostomy leaking. Nursing aware.    Objective Vital signs in last 24 hours: Vitals:   06/29/21 0000 06/29/21 0140 06/29/21 0400 06/29/21 0746  BP: (!) 70/39 (!) 71/42 (!) 95/50   Pulse: 60 (!) 58 60   Resp: '15 14 15   '$ Temp:    97.7 F (36.5 C)  TempSrc:   Oral Oral  SpO2: 100% 100% 100%   Weight:      Height:       Physical Exam: General: Elderly woman, chronically ill, nad  Heart: RRR no MRG Lungs: Clear bilaterally Non labored breathing, on nasal cannula.  Abdomen: soft non-tender; ileostomy bag in place  Extremities: No LE edema  Dialysis Access: Left IJ [facility]    Home meds include - tylenol, albuterol, amiodarone, apixaban, clonazepam, famotidine, fluticasone, hydromorphone, levalbuterol, metoclopramide, midodrine 10 tid, morphine IR, renavit, O2 3 L, promethazine, synthroid, cyanocobalamin        OP HD: [facility] TTS  3.5h  400/1.5  87.5kg  2/2 bath TDC  Hep 2700   - mircera 225 mcg q2, last 5/18, due 6/01 - rocaltrol 0.5 mcg tiw po - last HD 5/25, 90.1 > 89.9kg     Assessment/Plan: Sepsis - w/ chills, AMS at home.  Etiology thought to be abdominal wound, on admit high HR and soft BP's in ED. No drainable abscess per surgery. Initial blood cx with Staph epi -likely contaminant. Repeat Cx ngtd. Afebrile. WBCs trending down. Empiric antibiotics per primary team.    ESRD - on HD TTS.  Next HD 6/6.  Hypotension/ volume - Chronic hypotension. Cont midodrine 3 times daily. Euvolemic appearing. Weights variable here.   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 Aranesp 200 dosed on 6/1.  S/p ileostomy. Hx GIB. Upper endoscopy 6/2 -single AVM treated with APC  MBD ckd - CCa in range. Phos elevated. Resume Renvela binder.  PAF - on amiodarone. No AC currently    Lynnda Child PA-C [facility] 06/29/2021,9:36 AM   Labs: Basic Metabolic Panel: Recent Labs  Lab 06/24/21 1310 06/25/21 0427  06/27/21 0118 06/28/21 0607 06/28/21 0935  NA  --    < > 132* 135 134*  K  --    < > 3.4* 4.2 3.5  CL  --    < > 99 102 99  CO2  --    < > '25 23 25  '$ GLUCOSE  --    < > 75 127* 130*  BUN  --    < > '8 17 18  '$ CREATININE  --    < > 3.45* 5.20* 5.19*  CALCIUM  --    < > 8.0* 8.6* 8.5*  PHOS 8.1*  --   --   --  4.8*   < > = values in this interval not displayed.    Liver Function Tests: Recent Labs  Lab 06/24/21 0039 06/24/21 0505 06/28/21 0935  AST 27 24  --   ALT 18 13  --   ALKPHOS 103 77  --   BILITOT 0.6 0.3  --   PROT 5.5* 4.6*  --   ALBUMIN 2.2* 1.8* 2.2*    No results for input(s): LIPASE, AMYLASE in the last 168 hours. No results for input(s): AMMONIA in the last 168 hours. CBC: Recent Labs  Lab 06/24/21 0039 06/24/21 0505 06/25/21 0427 06/25/21 1331 06/26/21 0151 06/26/21 1915 06/27/21 0118 06/28/21 0607  WBC 17.2*   < > 11.8*  --  9.3 9.3 8.2 9.7  NEUTROABS 14.4*  --  9.1*  --   --   --   --   --   HGB 7.5*   < > 6.5*   < > 8.4* 8.7* 8.7* 8.8*  HCT 26.7*   < > 22.5*   < > 28.2* 28.9* 29.6* 29.9*  MCV 80.7   < > 79.2*  --  81.3 81.0 82.5 83.5  PLT 566*   < > 335  --  331 273 271 262   < > = values in this interval not displayed.    Cardiac Enzymes: Recent Labs  Lab 06/26/21 0151  CKTOTAL 32*    CBG: Recent Labs  Lab 06/27/21 0831 06/27/21 1246 06/27/21 1604 06/28/21 1602 06/29/21 0745  GLUCAP 74 83 87 109* 126*     Studies/Results: No results found. Medications:  DAPTOmycin (CUBICIN)  IV 116 mL/hr at 06/29/21 0451    amiodarone  200 mg Oral Daily   apixaban  2.5 mg Oral BID   calcitRIOL  0.5 mcg Oral Q T,Th,Sa-HD   Chlorhexidine Gluconate Cloth  6 each Topical Q0600   clonazePAM  0.5 mg Oral Once per day on Tue Thu Sat   clonazePAM  1 mg Oral Once per day on Sun Mon Wed Fri   darbepoetin (ARANESP) injection - DIALYSIS  200 mcg Intravenous Q Thu-HD   famotidine  20 mg Oral Q12H   insulin aspart  0-6 Units Subcutaneous TID WC    levothyroxine  175 mcg Oral QAC breakfast   metoCLOPramide  5 mg Oral TID AC & HS   midodrine  10 mg Oral TID   sevelamer carbonate  800 mg Oral TID WC   vitamin B-12  500 mcg Oral Daily

## 2021-06-30 ENCOUNTER — Ambulatory Visit: Payer: Medicare HMO | Admitting: Family Medicine

## 2021-06-30 ENCOUNTER — Encounter (HOSPITAL_COMMUNITY): Payer: Self-pay | Admitting: Internal Medicine

## 2021-06-30 DIAGNOSIS — N186 End stage renal disease: Secondary | ICD-10-CM

## 2021-06-30 LAB — BASIC METABOLIC PANEL
Anion gap: 11 (ref 5–15)
BUN: 19 mg/dL (ref 8–23)
CO2: 23 mmol/L (ref 22–32)
Calcium: 8.8 mg/dL — ABNORMAL LOW (ref 8.9–10.3)
Chloride: 100 mmol/L (ref 98–111)
Creatinine, Ser: 5.1 mg/dL — ABNORMAL HIGH (ref 0.44–1.00)
GFR, Estimated: 8 mL/min — ABNORMAL LOW (ref >60)
Glucose, Bld: 121 mg/dL — ABNORMAL HIGH (ref 70–99)
Potassium: 3.9 mmol/L (ref 3.5–5.1)
Sodium: 134 mmol/L — ABNORMAL LOW (ref 135–145)

## 2021-06-30 LAB — CBC
HCT: 31.5 % — ABNORMAL LOW (ref 36.0–46.0)
Hemoglobin: 9.3 g/dL — ABNORMAL LOW (ref 12.0–15.0)
MCH: 24.9 pg — ABNORMAL LOW (ref 26.0–34.0)
MCHC: 29.5 g/dL — ABNORMAL LOW (ref 30.0–36.0)
MCV: 84.2 fL (ref 80.0–100.0)
Platelets: 194 10*3/uL (ref 150–400)
RBC: 3.74 MIL/uL — ABNORMAL LOW (ref 3.87–5.11)
RDW: 20.6 % — ABNORMAL HIGH (ref 11.5–15.5)
WBC: 9.2 10*3/uL (ref 4.0–10.5)
nRBC: 0 % (ref 0.0–0.2)

## 2021-06-30 MED ORDER — CHLORHEXIDINE GLUCONATE CLOTH 2 % EX PADS: 6.0000 | MEDICATED_PAD | Freq: Every day | CUTANEOUS | Status: DC

## 2021-06-30 MED ORDER — SODIUM CHLORIDE 0.9 % IV SOLN
6.0000 mg/kg | INTRAVENOUS | Status: AC
  Administered 2021-06-30: 400 mg via INTRAVENOUS
  Filled 2021-06-30: qty 8

## 2021-06-30 NOTE — Progress Notes (Signed)
Physical Therapy Treatment Patient Details Name: [name] MRN: [id number] DOB: 09/26/1940 Today's Date: 06/30/2021   History of Present Illness 81 y.o. female admitted 5/29 with confusion and hypotension. PMHx:  ESRD on HD, Lynch syndrome with hx colon cancer s/p R hemicolectomy with colostomy, hypothyroidism, asthma, GERD, T2DM, paroxysmal A fib on eliquis, CAD, HLD, gout    PT Comments    Pt was seen for mobility but had already gotten to the chair with pain currently to move LLE at all.  Her tolerance for there ex is hindered by the pain, but is also concerned about the plan for her skin management.  Daughter was in room and discussed her concerns at length for skin care at home and hosp that are impacting potential to get infection on abd wounds.  Pt and daughter are asking for staff to discuss the skin needs for home since pt is leaving tomorrow potentially. Follow up in AM is planned with MD's to get agreement on pt needs vs her expectations with family for home health care.   Follow along for goals of acute PT as ordered in POC.     Recommendations for follow up therapy are one component of a multi-disciplinary discharge planning process, led by the attending physician.  Recommendations may be updated based on patient status, additional functional criteria and insurance authorization.  Follow Up Recommendations  Home health PT     Assistance Recommended at Discharge Frequent or constant Supervision/Assistance  Patient can return home with the following A little help with walking and/or transfers;A little help with bathing/dressing/bathroom;Assistance with cooking/housework;Direct supervision/assist for financial management;Assist for transportation;Help with stairs or ramp for entrance;Direct supervision/assist for medications management   Equipment Recommendations  None recommended by PT    Recommendations for Other Services       Precautions / Restrictions  Precautions Precautions: Fall Precaution Comments: O2 needed at times Restrictions Weight Bearing Restrictions: No     Mobility  Bed Mobility               General bed mobility comments: up in chair when PT arrived    Transfers                   General transfer comment: up in chair when PT arrived    Ambulation/Gait                   Stairs             Wheelchair Mobility    Modified Rankin (Stroke Patients Only)       Balance                                            Cognition Arousal/Alertness: Awake/alert Behavior During Therapy: WFL for tasks assessed/performed Overall Cognitive Status: Impaired/Different from baseline Area of Impairment: Awareness, Safety/judgement, Following commands                 Orientation Level: Time, Situation   Memory: Decreased short-term memory Following Commands: Follows one step commands with increased time Safety/Judgement: Decreased awareness of safety, Decreased awareness of deficits Awareness: Intellectual Problem Solving: Slow processing, Requires verbal cues General Comments: pt is slow to follow commands but can do there ex with PT cuing her        Exercises General Exercises - Lower Extremity Ankle Circles/Pumps: AROM, 5 reps Long Arc  Quad: Strengthening, AROM, 10 reps Heel Slides: AROM, Strengthening, 10 reps Hip ABduction/ADduction: AROM, Strengthening, 10 reps Hip Flexion/Marching: AROM, 10 reps    General Comments        Pertinent Vitals/Pain Pain Assessment Pain Assessment: Faces Faces Pain Scale: Hurts little more Pain Location: abd pain Pain Descriptors / Indicators: Burning, Guarding, Sore Pain Intervention(s): Limited activity within patient's tolerance, Monitored during session, Premedicated before session, Repositioned    Home Living                          Prior Function            PT Goals (current goals can now be  found in the care plan section) Acute Rehab PT Goals Patient Stated Goal: return home    Frequency    Min 3X/week      PT Plan Current plan remains appropriate    Co-evaluation              AM-PAC PT "6 Clicks" Mobility   Outcome Measure  Help needed turning from your back to your side while in a flat bed without using bedrails?: A Little Help needed moving from lying on your back to sitting on the side of a flat bed without using bedrails?: A Little Help needed moving to and from a bed to a chair (including a wheelchair)?: A Little Help needed standing up from a chair using your arms (e.g., wheelchair or bedside chair)?: A Little Help needed to walk in hospital room?: A Little Help needed climbing 3-5 steps with a railing? : Total 6 Click Score: 16    End of Session   Activity Tolerance: Patient tolerated treatment well Patient left: in chair;with call bell/phone within reach;with chair alarm set;with family/visitor present Nurse Communication: Mobility status;Other (comment) (family and pt discussion about her skin care needs) PT Visit Diagnosis: Other abnormalities of gait and mobility (R26.89);Difficulty in walking, not elsewhere classified (R26.2);Muscle weakness (generalized) (M62.81)     Time: 1655-3748 PT Time Calculation (min) (ACUTE ONLY): 26 min  Charges:  $Therapeutic Exercise: 8-22 mins $Therapeutic Activity: 8-22 mins    Ramond Dial 06/30/2021, 4:22 PM  Mee Hives, PT PhD Acute Rehab Dept. Number: [place] and [place]

## 2021-06-30 NOTE — Progress Notes (Signed)
Subjective: Seen in room with daughter present, for dialysis tomorrow.  She is sitting upright on edge of bed.  No shortness of breath or chest pain, she and daughter have concerns about ongoing abdominal wounds.  Objective Vital signs in last 24 hours: Vitals:   06/29/21 2014 06/29/21 2322 06/30/21 0336 06/30/21 0929  BP:  (!) 98/54 105/78 (!) 106/56  Pulse: 60 63 62   Resp: '16 18 20 17  '$ Temp:  97.8 F (36.6 C) 97.8 F (36.6 C) 97.8 F (36.6 C)  TempSrc:  Axillary Axillary Oral  SpO2: 100% 100% 100%   Weight:      Height:       Weight change:   Physical Exam: General: Obese elderly female chronically ill,.  Pleasantly confused(about baseline)  NAD Heart: RRR no MRG Lungs: CTA bilaterally nonlabored breathing nasal cannula oxygen Abdomen: Obese, NABS, soft left lower quadrant wound dressing dry clear ileostomy bag with stool present  Extremities: No pedal edema Dialysis Access: Left IJ TDC       Home meds include - tylenol, albuterol, amiodarone, apixaban, clonazepam, famotidine, fluticasone, hydromorphone, levalbuterol, metoclopramide, midodrine 10 tid, morphine IR, renavit, O2 3 L, promethazine, synthroid, cyanocobalamin        OP HD: [facility]  3.5h  400/1.5  87.5kg  2/2 bath TDC  Hep 2700   - mircera 225 mcg q2, last 5/18, due 6/01 - rocaltrol 0.5 mcg tiw po - last HD 5/25, 90.1 > 89.9kg     Problem/Plan: Sepsis - w/ chills, AMS at home.  Etiology thought to be abdominal wound, on admit high HR and soft BP's in ED.   Admit team started on broad spec IV abx.  And wound care manage ESRD - on HD TTS.  HD tomorrow oday on schedule, midodrine on dialysis days for BP control K3.9, NA 134 this a.m. Hypotension/ volume - BP's stable this a.m. for her and asymptomatic.  Cont midodrine 3 times daily Anemia esrd -Hgb 9 3 this a.m.  transfusion  1P RBC last wk  transfuse prn.  Aranesp 200 on 6/01  MBD ckd - CCa in range, add on phos. Cont po vdra.   PAF - on eliquis, amiodarone SR  on exam, Rx per pmd Chronic anxiety /AMS on admit(now clearing probably related to sepsis) Rx per admit team  Ernest Haber, PA-C [facility] [phone] 06/30/2021,9:39 AM  LOS: 6 days   Labs: Basic Metabolic Panel: Recent Labs  Lab 06/24/21 1310 06/25/21 0427 06/28/21 0607 06/28/21 0935 06/30/21 0046  NA  --    < > 135 134* 134*  K  --    < > 4.2 3.5 3.9  CL  --    < > 102 99 100  CO2  --    < > '23 25 23  '$ GLUCOSE  --    < > 127* 130* 121*  BUN  --    < > '17 18 19  '$ CREATININE  --    < > 5.20* 5.19* 5.10*  CALCIUM  --    < > 8.6* 8.5* 8.8*  PHOS 8.1*  --   --  4.8*  --    < > = values in this interval not displayed.   Liver Function Tests: Recent Labs  Lab 06/24/21 0039 06/24/21 0505 06/28/21 0935  AST 27 24  --   ALT 18 13  --   ALKPHOS 103 77  --   BILITOT 0.6 0.3  --   PROT 5.5* 4.6*  --  ALBUMIN 2.2* 1.8* 2.2*   No results for input(s): LIPASE, AMYLASE in the last 168 hours. No results for input(s): AMMONIA in the last 168 hours. CBC: Recent Labs  Lab 06/24/21 0039 06/24/21 0505 06/25/21 0427 06/25/21 1331 06/26/21 0151 06/26/21 1915 06/27/21 0118 06/28/21 0607 06/30/21 0046  WBC 17.2*   < > 11.8*  --  9.3 9.3 8.2 9.7 9.2  NEUTROABS 14.4*  --  9.1*  --   --   --   --   --   --   HGB 7.5*   < > 6.5*   < > 8.4* 8.7* 8.7* 8.8* 9.3*  HCT 26.7*   < > 22.5*   < > 28.2* 28.9* 29.6* 29.9* 31.5*  MCV 80.7   < > 79.2*  --  81.3 81.0 82.5 83.5 84.2  PLT 566*   < > 335  --  331 273 271 262 194   < > = values in this interval not displayed.   Cardiac Enzymes: Recent Labs  Lab 06/26/21 0151  CKTOTAL 32*   CBG: Recent Labs  Lab 06/27/21 1604 06/28/21 1602 06/29/21 0745 06/29/21 1214 06/29/21 1559  GLUCAP 87 109* 126* 125* 182*    Studies/Results: No results found. Medications:  DAPTOmycin (CUBICIN)  IV      amiodarone  200 mg Oral Daily   apixaban  2.5 mg Oral BID   calcitRIOL  0.5 mcg Oral Q T,Th,Sa-HD   Chlorhexidine  Gluconate Cloth  6 each Topical Q0600   clonazePAM  0.5 mg Oral Once per day on Tue Thu Sat   clonazePAM  1 mg Oral Once per day on Sun Mon Wed Fri   darbepoetin (ARANESP) injection - DIALYSIS  200 mcg Intravenous Q Thu-HD   famotidine  20 mg Oral Q12H   levothyroxine  175 mcg Oral QAC breakfast   metoCLOPramide  5 mg Oral TID AC & HS   midodrine  10 mg Oral TID   sevelamer carbonate  800 mg Oral TID WC   vitamin B-12  500 mcg Oral Daily

## 2021-06-30 NOTE — TOC Progression Note (Signed)
Transition of Care [facility]) - Progression Note    Patient Details  Name: ORPHIA MCTIGUE MRN: [id number] Date of Birth: [birth year]  Transition of Care [facility]) CM/SW [place], RN Phone Number: 06/30/2021, 3:14 PM  Clinical Narrative:    Spoke to patient and daughter at bedside. Plan is for patient to discharge tomorrow after dialysis. Daughter will bring portable home 02 tank for discharge. Home Health arranged. Hospital bed ordered and will be delivered [facility] will continue to follow for needs Address, Phone number and PCP verified.     Expected Discharge Plan: [place] Barriers to Discharge: Continued Medical Work up  Expected Discharge Plan and Services Expected Discharge Plan: [place]   Discharge Planning Services: CM Consult Post Acute Care Choice: Home Health, Durable Medical Equipment Living arrangements for the past 2 months: Single Family Home                 DME Arranged: Hospital bed DME Agency: [employer] Date DME Agency Contacted: 06/25/21 Time DME Agency Contacted: [id number] Representative spoke with at DME Agency: [place]: RN, PT, Nurse's Aide [place] Agency: [place] Date [place]: 06/25/21 Time [place]: 1332 Representative spoke with at [place]: [place] ([place]) Interventions    Readmission Risk Interventions     View : No data to display.

## 2021-06-30 NOTE — Progress Notes (Signed)
Family Medicine Teaching Service Daily Progress Note Intern Pager: [phone]  Patient name: [name] Medical record number: [id number] Date of birth: [birth year] Age: 81 y.o. Gender: female  Primary Care Provider: [name], MD Consultants: Palliative medicine, GI, nephrology Code Status: Full  Pt Overview and Major Events to Date:  5/30-admitted for sepsis, received cefepime, linezolid, metronidazole; transfuse 1 unit PRBC 5/31-transfuse 1 unit PRBC, antibiotics transition to cefepime and daptomycin 6/1-3 days cefepime complete; remains on daptomycin 6/2-EGD with evidence of gastritis and duodenal angiectasia s/p APC  Assessment and Plan:  [name] is a 81 year old female who presented with hypotension and confusion 2/2 sepsis in the setting of abdominal wound.  PMH significant for ESRD on HD, T2DM, PAF on Eliquis, CAD, HLD, GERD, asthma, and Lynch syndrome c/b colon cancer s/p right hemicolectomy with ileostomy.  Sepsis in the setting of abdominal wound, resolved Cellulitis surrounding abdominal wound, improved Patient no longer meeting sepsis criteria.  Mental status at baseline.  MRSE in 1 set of blood cultures suspected to be contaminant. -Continue IV daptomycin, completing dose 7/7 today.  Chronic hypotension Remains hypotensive with most recent BP: 106/56, and 24-hour BP: 97-111/54-78 -Continue midodrine 10 mg 3 times daily -Can give 250 to 500 cc fluid bolus as needed  Acute on chronic anemia Stable.  Hgb 9.3. s/p EGD which revealed duodenal angiectasia s/p APC.  GI following, appreciate recs. -Continue to monitor daily CBC -Transfusion threshold Hgb<8  ESRD on HD Tuesday/Thursday/Saturday schedule. -Continue HD as scheduled; to be dialyzed on Tuesday prior to discharge  Stage I pressure ulcer -Continue DuoDERM dressing  History of Lynch syndrome c/b colon cancer s/p hemicolectomy with ileostomy -Wound and ostomy care per nursing  T2DM Diet  controlled.  CBG since admission <182.  -Discontinued SSI CBG monitoring  PAF - Continue home amiodarone daily and Eliquis 2.5 mg twice daily  Chronic conditions: CAD-stable, not on any antiplatelets Anxiety-continue home clonazepam 1 mg on non-HD days and 0.5 mg on HD days Hypothyroidism-continue home levothyroxine 175 mcg GERD-continue home famotidine decreased to 10 mg Renal function   FEN/GI: Renal PPx: Eliquis Dispo:Home with home health  in 2-3 days. Barriers include completion of IV antibiotics and HD.   Subjective:  Patient seen sitting up on edge of bed, just finished eating breakfast.  Patient reports feeling well today, but daughter reports concerns about new abdominal lesions that appear similarly to the current lesions for which she was treated.  All questions were answered and concerns addressed.  Objective: Temp:  [97.6 F (36.4 C)-97.8 F (36.6 C)] 97.8 F (36.6 C) (06/05 0336) Pulse Rate:  [60-63] 62 (06/05 0336) Resp:  [16-20] 20 (06/05 0336) BP: (96-111)/(54-78) 105/78 (06/05 0336) SpO2:  [99 %-100 %] 100 % (06/05 0336) Physical Exam: General: Chronically ill-appearing, obese, elderly woman, sitting on EOB, alert and NAD Cardiovascular: RRR, no murmurs/rubs/gallops Respiratory: Normal WOB on RA.  CTA x2 Abdomen: Mild, diffuse tenderness.  Ileostomy bag with brown, nonbloody stool.  LLQ wound with dressing C/D/I. Extremities: Warm well perfused.  Nonedematous BLE.  Laboratory: Recent Labs  Lab 06/27/21 0118 06/28/21 0607 06/30/21 0046  WBC 8.2 9.7 9.2  HGB 8.7* 8.8* 9.3*  HCT 29.6* 29.9* 31.5*  PLT 271 262 194   Recent Labs  Lab 06/24/21 0039 06/24/21 0505 06/25/21 0427 06/28/21 0607 06/28/21 0935 06/30/21 0046  NA 134* 132*   < > 135 134* 134*  K 4.7 4.4   < > 4.2 3.5 3.9  CL 98 99   < >  102 99 100  CO2 21* 21*   < > '23 25 23  '$ BUN 31* 31*   < > '17 18 19  '$ CREATININE 10.00* 9.36*   < > 5.20* 5.19* 5.10*  CALCIUM 8.8* 8.1*   < > 8.6* 8.5*  8.8*  PROT 5.5* 4.6*  --   --   --   --   BILITOT 0.6 0.3  --   --   --   --   ALKPHOS 103 77  --   --   --   --   ALT 18 13  --   --   --   --   AST 27 24  --   --   --   --   GLUCOSE 117* 157*   < > 127* 130* 121*   < > = values in this interval not displayed.     Imaging/Diagnostic Tests: No new images to review  Rosezetta Schlatter, MD 06/30/2021, 7:36 AM PGY-1, [place] Intern pager: [phone], text pages welcome

## 2021-06-30 NOTE — Progress Notes (Signed)
FPTS Brief Progress Note  S:Went to see patient, patient sleeping comfortably. Did not disturb.   O: BP (!) 98/54 (BP Location: Right Leg)   Pulse 63   Temp 97.8 F (36.6 C) (Axillary)   Resp 18   Ht '5\' 2"'$  (1.575 m)   Wt 97.5 kg   SpO2 100%   BMI 39.32 kg/m     A/P: Plans per day team - Orders reviewed. Labs for AM ordered, which was adjusted as needed.   Holley Bouche, MD 06/30/2021, 1:29 AM PGY-1, [facility] Family Medicine Night Resident  Please page [phone] with questions.

## 2021-06-30 NOTE — Progress Notes (Signed)
   Durable Medical Equipment (From admission, onward)        Start     Ordered  06/30/21 1344  For home use only DME Hospital bed  Once      Question Answer Comment Length of Need Lifetime  The above medical condition requires: Patient requires the ability to reposition frequently  Bed type Semi-electric  Support Surface: Gel Overlay    06/30/21 1344

## 2021-06-30 NOTE — Plan of Care (Signed)
  Problem: Education: Goal: Knowledge of General Education information will improve Description: Including pain rating scale, medication(s)/side effects and non-pharmacologic comfort measures Outcome: Progressing   Problem: Activity: Goal: Risk for activity intolerance will decrease Outcome: Progressing   Problem: Nutrition: Goal: Adequate nutrition will be maintained Outcome: Progressing   Problem: Coping: Goal: Level of anxiety will decrease Outcome: Progressing   

## 2021-07-01 ENCOUNTER — Encounter: Payer: Self-pay | Admitting: *Deleted

## 2021-07-01 ENCOUNTER — Other Ambulatory Visit (HOSPITAL_COMMUNITY): Payer: Self-pay

## 2021-07-01 DIAGNOSIS — Z992 Dependence on renal dialysis: Secondary | ICD-10-CM

## 2021-07-01 DIAGNOSIS — L89151 Pressure ulcer of sacral region, stage 1: Secondary | ICD-10-CM

## 2021-07-01 LAB — CBC
HCT: 32.2 % — ABNORMAL LOW (ref 36.0–46.0)
Hemoglobin: 9.4 g/dL — ABNORMAL LOW (ref 12.0–15.0)
MCH: 24.5 pg — ABNORMAL LOW (ref 26.0–34.0)
MCHC: 29.2 g/dL — ABNORMAL LOW (ref 30.0–36.0)
MCV: 84.1 fL (ref 80.0–100.0)
Platelets: 152 10*3/uL (ref 150–400)
RBC: 3.83 MIL/uL — ABNORMAL LOW (ref 3.87–5.11)
RDW: 21.1 % — ABNORMAL HIGH (ref 11.5–15.5)
WBC: 10.6 10*3/uL — ABNORMAL HIGH (ref 4.0–10.5)
nRBC: 0 % (ref 0.0–0.2)

## 2021-07-01 LAB — RENAL FUNCTION PANEL
Albumin: 2.1 g/dL — ABNORMAL LOW (ref 3.5–5.0)
Anion gap: 8 (ref 5–15)
BUN: 23 mg/dL (ref 8–23)
CO2: 24 mmol/L (ref 22–32)
Calcium: 9 mg/dL (ref 8.9–10.3)
Chloride: 102 mmol/L (ref 98–111)
Creatinine, Ser: 6.26 mg/dL — ABNORMAL HIGH (ref 0.44–1.00)
GFR, Estimated: 6 mL/min — ABNORMAL LOW (ref >60)
Glucose, Bld: 120 mg/dL — ABNORMAL HIGH (ref 70–99)
Phosphorus: 3.9 mg/dL (ref 2.5–4.6)
Potassium: 3.5 mmol/L (ref 3.5–5.1)
Sodium: 134 mmol/L — ABNORMAL LOW (ref 135–145)

## 2021-07-01 LAB — SURGICAL PATHOLOGY

## 2021-07-01 LAB — CK: Total CK: 15 U/L — ABNORMAL LOW (ref 38–234)

## 2021-07-01 MED ORDER — LIDOCAINE HCL (PF) 1 % IJ SOLN
5.0000 mL | INTRAMUSCULAR | Status: DC | PRN
  Filled 2021-07-01: qty 5

## 2021-07-01 MED ORDER — LIDOCAINE-PRILOCAINE 2.5-2.5 % EX CREA: 1.0000 "application " | TOPICAL_CREAM | CUTANEOUS | Status: DC | PRN

## 2021-07-01 MED ORDER — SEVELAMER CARBONATE 800 MG PO TABS: 800.0000 mg | ORAL_TABLET | Freq: Three times a day (TID) | ORAL | Status: DC

## 2021-07-01 MED ORDER — MEDIHONEY WOUND/BURN DRESSING EX PSTE: 1.0000 "application " | PASTE | Freq: Every day | CUTANEOUS | Status: AC

## 2021-07-01 MED ORDER — HEPARIN SODIUM (PORCINE) 1000 UNIT/ML DIALYSIS
2700.0000 [IU] | INTRAMUSCULAR | Status: DC | PRN
  Administered 2021-07-01: 2700 [IU] via INTRAVENOUS_CENTRAL
  Filled 2021-07-01: qty 3

## 2021-07-01 MED ORDER — MEDIHONEY WOUND/BURN DRESSING EX PSTE
1.0000 "application " | PASTE | Freq: Every day | CUTANEOUS | Status: DC
  Filled 2021-07-01: qty 44

## 2021-07-01 MED ORDER — ANTICOAGULANT SODIUM CITRATE 4% (200MG/5ML) IV SOLN
5.0000 mL | Status: DC | PRN
Start: 2021-07-01 — End: 2021-07-01
  Filled 2021-07-01: qty 5

## 2021-07-01 MED ORDER — PENTAFLUOROPROP-TETRAFLUOROETH EX AERO: 1.0000 "application " | INHALATION_SPRAY | CUTANEOUS | Status: DC | PRN

## 2021-07-01 MED ORDER — FAMOTIDINE 10 MG PO TABS
10.0000 mg | ORAL_TABLET | Freq: Two times a day (BID) | ORAL | 0 refills | Status: AC
  Filled 2021-07-01: qty 60, 30d supply, fill #0

## 2021-07-01 MED ORDER — ALTEPLASE 2 MG IJ SOLR: 2.0000 mg | Freq: Once | INTRAMUSCULAR | Status: DC | PRN

## 2021-07-01 MED ORDER — HEPARIN SODIUM (PORCINE) 1000 UNIT/ML DIALYSIS
1000.0000 [IU] | INTRAMUSCULAR | Status: DC | PRN
  Filled 2021-07-01 (×3): qty 1

## 2021-07-01 NOTE — Progress Notes (Signed)
Folded pillow cases placed in the folds of bilat groins and breast and under her abd folds. All areas very red, painful to touch, and with skin starting to crack. Antifungal powder is clumping up in these areas and causing pain when cleaning off.

## 2021-07-01 NOTE — Discharge Instructions (Signed)
Dear [name], I am so glad you are feeling better and can be discharged today (07/01/2021)! You were admitted for infection due to abdominal wounds.   Please see the following instructions: Please see your primary care / family doctor for your other medical issues, concerns, and/or health care needs. NEW meds:  MediHoney paste to apply to abdominal wounds, under breasts, and in groin folds. CONTINUE home meds: As listed on discharge paperwork STOP meds:  Morphine, doxycycline  Report any adverse effects and/or reactions from the medicines to your outpatient provider promptly. Do not engage in alcohol and or illegal drug use while on prescription medicines. In the event of worsening symptoms, call 911 and/or go to the nearest ED for appropriate evaluation and treatment of symptoms.  It was a pleasure meeting you, [name].  I wish you and your family the best, and hope you stay happy and healthy!  Rosezetta Schlatter, MD 07/01/2021

## 2021-07-01 NOTE — Procedures (Signed)
Patient seen on Hemodialysis. BP (!) 123/51   Pulse 65   Temp 98.6 F (37 C)   Resp 17   Ht '5\' 2"'$  (1.575 m)   Wt 98 kg   SpO2 100%   BMI 39.52 kg/m   QB 400, UF goal 1L Tolerating treatment without complaints at this time.   Elmarie Shiley MD [facility]. Office # [phone] Pager # [phone] 9:25 AM

## 2021-07-01 NOTE — Progress Notes (Signed)
Pt without oxygen all evening and no c/o  SOB then at 2355 pt put oxygen back onl

## 2021-07-01 NOTE — Progress Notes (Signed)
OT Cancellation Note   Patient Details Name: [name] MRN: [id number] DOB: 1940-08-17     Cancelled Treatment:    Reason Eval/Treat Not Completed: Other (comment): Pt currently OTF this AM for HD, OT will continue with efforts this date schedule permitting    [place] OTR/L acute rehab services Office: [phone]

## 2021-07-01 NOTE — Progress Notes (Signed)
FPTS Brief Note Reviewed patient's vitals, recent notes.  Vitals:   06/30/21 1950 06/30/21 2300  BP: (!) 108/56 115/62  Pulse: 64 62  Resp: 18 18  Temp: 97.8 F (36.6 C) 97.9 F (36.6 C)  SpO2:     At this time, no change in plan from day progress note.  Holley Bouche, MD Page [phone] with questions about this patient.

## 2021-07-01 NOTE — Plan of Care (Signed)
  Problem: Clinical Measurements: Goal: Will remain free from infection Outcome: Progressing Note: Pt was given last dose of IV Daptamycin Monday night (per [facility])

## 2021-07-01 NOTE — Consult Note (Addendum)
WOC Nurse Re-consult Note: Refer to previous consult note by [facility] team on 5/30 for abd wounds and ileostomy. Surgical team also performed a consult for the abd wounds on 5/30. Requested to reassess abd wounds, since new areas have occurred. Assessed abd with primary team at the bedside while patient was in dialysis.  Patchy areas of full thickness wounds to left lower abd are dark purple with necrotic areas and yellow slough, very painful to touch. Previous notes from surgical team and [facility] consult indicate:  "I discussed with the daughter, patient, and Dr. Earley Favor that if this does represent pyoderma gangrenosum that is an inflammatory condition that is best treated with steroids. It can be determined by a DERMATOLOGIC pathologist by biopsy." Discussed this again with the primary team and they agree that patient should obtain follow-up with dermatology team after discharge.  We do not have that service available in the acute care setting at [facility]  Skin folds to bilat breast and abd are red, moist, and macerated.  Appearance is consistent with intertrigo.   ICD-10 CM Codes for Irritant Dermatitis L24A0 - Due to friction or contact with body fluids; unspecified Suggested use of Interdry silver impregnated fabric for treatment, but patient declines.  She stated she has used this product before and it "becomes dirty and it smells." Dressing procedure/placement/frequency: Topical treatment orders provided for bedside nurses to perform as follows: Apply Medihoney top bilat breast and abd skin folds Q day, then cover with gauze dressings. BEDSIDE NURSE:  Please administer when pt returns from dialysis, then give to the daughter upon discharge to take home Discussed plan of care with patient's daughter via phone call. Recommend home health assistance for wound care assistance after discharge. Went to patient's room while pt was in dialysis to demonstrate dressing change procedures to abd wounds and breast/abd  skin folds with pt's daughter and discuss use of products.  She verbalizes understanding.  Please re-consult if further assistance is needed.  Thank-you,  Julien Girt MSN, [place], [place], [place], [facility]

## 2021-07-01 NOTE — Discharge Summary (Signed)
[facility] Discharge Summary  Patient name: [name] Medical record number: [id number] Date of birth: 05/21/1940 Age: 81 y.o. Gender: female Date of Admission: 06/23/2021  Date of Discharge: 07/01/2021 Admitting Physician: Leeanne Rio, MD  Primary Care Provider: [name], MD Consultants: Nephrology, GI, palliative medicine  Indication for Hospitalization: Sepsis secondary to infected abdominal wound  Discharge Diagnoses/Problem List:  Sepsis, resolved Altered mental status, resolved Cellulitis of abdominal wound ESRD on HD Anemia, acute on chronic  Disposition: Home with [facility] RN, OT, and PT  Discharge Condition: Stable  Discharge Exam: NAEON.  This morning, patient seen in HD.  She is more alert than she has been throughout this admission.  She reports no abdominal pain outside of dressing changes.  She denies any acute concerns or complaints today, and feels ready to discharge home.  General: Chronically ill-appearing, obese, elderly woman lying in bed in HD, alert and in NAD Cardiovascular: RRR, no murmurs/rubs/gallops Respiratory: Normal WOB on room air.  CTA x2 Abdomen: Mild, diffuse tenderness.  Ileostomy bag with brown, nonbloody stool.  LLQ wound seen below. Extremities: Warm and well perfused.  Nonedematous BLE. Derm: Intertrigo.  Noted along inguinal folds and beneath both breasts, L> R.  Picture of left breast seen below.      Brief Hospital Course:  [name] is a 81 y.o. female who was admitted to the [facility] for sepsis secondary to infected abdominal wound. Hospital course is outlined below by system.    Sepsis likely 2/2 to abdominal wound, resolved  AMS, resolved  Cellulitis surrounding abdominal wound Patient presented with hypotension and intermittent confusion lasting 3-4 days PTA. Noted to have worsening purulent LLQ abdominal wound. As well, patient was taking morphine  and missed a round of HD due to mental status changes. On admission she was hypotensive to 93/59 and tachycardic to the 110s. Initial labs showed elevated lactic acid of 3.4 and leukocytosis to 17.2 In the ED received IV cefepime and PO flagyl and eventually transitioned to IV Cefepime (completed 3 day course) and daptomycin (completed 7 dose course) for MRSA coverage. Wound care team was consulted for wound and ostomy management. Prior to discharge, patient with resolved WBC, stable vital signs, and baseline mentation.  Patient provided with Medihoney for wounds upon discharge.   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 Nephrology was consulted who managed her dialysis while admitted. Patient completed 4 rounds of HD this admission and was back on her regular schedule by the time of discharge.   Anemia of chronic kidney disease Hgb on admission 7.5  with MCV of 80.7, baseline Hgb is 9.3. Recheck decreased to 6.1, and she was transfused 1u pRBCs under the guidance of nephro managing fluid status. Post-transfusion Hgb 8.0, then dec to 6.5 by AM. She was transfused an additional unit and maintained Hgb 8.4-8.8 since. Threshold for transfusion was set at < 8. Well known history of ESRD but we thought her anemia was likely compounded by reported recent bleeding around the colostomy bag. GI was consulted who performed upper endoscopy revealing diffuse gastritis (biopsied) and a bleeding duodenal angioectasia (fulgurated with APC) and ileoscopy revealing a single 15 mm polyp in the small bowel (polypectomy); as well, determined component of gastroparesis likely contributing to abdominal pain.  After procedure, patient's Hgb 8.8-9.3.  Chronic conditions managed with home meds:  PAF: Home amiodarone continued throughout admission, and home Eliquis 2.5 mg twice daily restarted after GI  procedures. CAD-stable, not on any antiplatelets Anxiety-continue home clonazepam 1 mg on non-HD days and 0.5 mg on HD days Hypothyroidism-continue home levothyroxine 175 mcg GERD-continue home famotidine decreased to 10 mg Renal function T2DM-diet controlled.  CBG during admission no greater than 182.     Discharge recommendations for follow up: Avoid morphine in this patient due to ESRD on HD.  Wound care; patient discharged with [facility] RN. Consider dermatology referral for a skin biopsy to rule out pyoderma gangrenosum.     Significant Procedures: Ileoscopy and upper endoscopy  Significant Labs and Imaging:  Recent Labs  Lab 06/28/21 0607 06/30/21 0046 07/01/21 0146  WBC 9.7 9.2 10.6*  HGB 8.8* 9.3* 9.4*  HCT 29.9* 31.5* 32.2*  PLT 262 194 152   Recent Labs  Lab 06/27/21 0118 06/28/21 0607 06/28/21 0935 06/30/21 0046 07/01/21 0146  NA 132* 135 134* 134* 134*  K 3.4* 4.2 3.5 3.9 3.5  CL 99 102 99 100 102  CO2 '25 23 25 23 24  '$ GLUCOSE 75 127* 130* 121* 120*  BUN '8 17 18 19 23  '$ CREATININE 3.45* 5.20* 5.19* 5.10* 6.26*  CALCIUM 8.0* 8.6* 8.5* 8.8* 9.0  PHOS  --   --  4.8*  --  3.9  ALBUMIN  --   --  2.2*  --  2.1*     Results/Tests Pending at Time of Discharge: Surgical pathology  Discharge Medications:  Allergies as of 07/01/2021       Reactions   Adhesive [tape] Itching, Swelling, Rash, Other (See Comments)   Tears skin and causes blisters also. EKG pads will cause welts   Avelox [moxifloxacin] Swelling, Rash   Banana Anaphylaxis, Other (See Comments)   Blisters appear also   Blueberry Flavor Anaphylaxis   Cantaloupe Extract Allergy Skin Test Anaphylaxis, Other (See Comments)   Blisters appear also   Cefprozil Shortness Of Breath, Rash, Other (See Comments)   Tolerated ceftriaxone on 06/20/20   Cetacaine [butamben-tetracaine-benzocaine] Nausea And Vomiting, Swelling   Dicyclomine Nausea And Vomiting, Other (See Comments)   "Heart trouble"; Headaches and  increased blood sugars   Food Anaphylaxis, Other (See Comments)   Melons- throat closes and blisters appear   Imdur [isosorbide Nitrate] Hives, Palpitations, Other (See Comments), Rash   Headaches also   Januvia [sitagliptin] Shortness Of Breath   Lipitor [atorvastatin] Shortness Of Breath   Losartan Potassium Shortness Of Breath   Nitroglycerin Other (See Comments)   Caused cardiac arrest and feels like skin bring torn off back of head   Omeprazole Shortness Of Breath, Swelling   Oxycodone Hives, Rash, Other (See Comments)   Tolerates Dilaudid   Penicillins Anaphylaxis   Has patient had a PCN reaction causing immediate rash, facial/tongue/throat swelling, SOB or lightheadedness with hypotension: Yes Has patient had a PCN reaction causing severe rash involving mucus membranes or skin necrosis: No Has patient had a PCN reaction that required hospitalization Yes Has patient had a PCN reaction occurring within the last 10 years: No   Prednisone Anaphylaxis   Vancomycin Anaphylaxis   Watermelon [citrullus Vulgaris] Anaphylaxis, Other (See Comments)   Blisters appear also   Hydrocodone Hives, Other (See Comments)   Tolerates Dilaudid   Latex Rash, Other (See Comments)   Blisters   Tamiflu [oseltamivir] Other (See Comments)   Contraindicated with other medications Patient on tikosyn, and tamiflu interfered with anti arrhythmic med Contraindicated with other medications Patient  on tikosyn, and tamiflu interfered with anti arrhythmic med   Feraheme [ferumoxytol] Other (See Comments)   Sharp pain to lower back and flank   Other Other (See Comments)   Hydrogen - unknown- patient does not recall this (??)   Lasix [furosemide] Hives, Swelling, Rash   Mupirocin Rash        Medication List     STOP taking these medications    doxycycline 100 MG tablet Commonly known as: VIBRA-TABS   HYDROmorphone 2 MG tablet Commonly known as: Dilaudid   metroNIDAZOLE 500 MG tablet Commonly  known as: FLAGYL   morphine 15 MG tablet Commonly known as: MSIR       TAKE these medications    acetaminophen 500 MG tablet Commonly known as: TYLENOL Take 0.5 tablets (250 mg total) by mouth every 6 (six) hours as needed for mild pain (pain). What changed:  how much to take when to take this   albuterol 108 (90 Base) MCG/ACT inhaler Commonly known as: VENTOLIN HFA Inhale 1-2 puffs into the lungs every 6 (six) hours as needed for wheezing or shortness of breath.   amiodarone 200 MG tablet Commonly known as: PACERONE TAKE 1 TABLET EVERY DAY   cetirizine 10 MG tablet Commonly known as: ZYRTEC Take 10 mg by mouth daily.   clonazePAM 1 MG tablet Commonly known as: KLONOPIN Take 0.5-1 mg by mouth See admin instructions. Take 1 mg by mouth at bedtime on Sun/Mon/Wed/Fri and 0.5 mg at bedtime on Tues/Thurs/Sat- may take an additional 0.5-1 mg up to two (more) times a day as needed for anxiety (total combined daily dose is a max of 3 milligrams)   Eliquis 2.5 MG Tabs tablet Generic drug: apixaban TAKE ONE (1) TABLET BY MOUTH TWO (2) TIMES DAILY What changed: See the new instructions.   famotidine 10 MG tablet Commonly known as: PEPCID Take 1 tablet (10 mg total) by mouth 2 (two) times daily. What changed:  medication strength how much to take when to take this   fluticasone 50 MCG/ACT nasal spray Commonly known as: FLONASE Place 1 spray into both nostrils daily as needed for allergies or rhinitis.   leptospermum manuka honey Pste paste Apply 1 application. topically daily.   levalbuterol 1.25 MG/3ML nebulizer solution Commonly known as: XOPENEX Take 1.25 mg by nebulization 2 (two) times daily as needed for wheezing or shortness of breath.   metoCLOPramide 5 MG tablet Commonly known as: REGLAN TAKE ONE TABLET FOUR TIMES A DAY BEFORE MEALS AND AT BEDTIME What changed: See the new instructions.   midodrine 10 MG tablet Commonly known as: PROAMATINE Take 1 tablet  (10 mg total) by mouth 3 (three) times daily.   multivitamin Tabs tablet Take 1 tablet by mouth at bedtime.   OXYGEN Inhale 3 L into the lungs as needed (shortness of breath).   promethazine 12.5 MG tablet Commonly known as: PHENERGAN Take 12.5 mg by mouth every 6 (six) hours as needed for nausea or vomiting.   sevelamer carbonate 800 MG tablet Commonly known as: RENVELA Take 1 tablet (800 mg total) by mouth 3 (three) times daily with meals.   Synthroid 175 MCG tablet Generic drug: levothyroxine Take 175 mcg by mouth daily before breakfast.   vitamin B-12 500 MCG tablet Commonly known as: CYANOCOBALAMIN Take 500 mcg by mouth daily.               Durable Medical Equipment  (From admission, onward)  Start     Ordered   07/01/21 1305  For home use only DME Other see comment  Once       Comments: Over the bed/ Bedside table for hospital bed  Question:  Length of Need  Answer:  Lifetime   07/01/21 1304   06/30/21 1344  For home use only DME Hospital bed  Once       Question Answer Comment  Length of Need Lifetime   The above medical condition requires: Patient requires the ability to reposition frequently   Bed type Semi-electric   Support Surface: Gel Overlay      06/30/21 1344              Discharge Care Instructions  (From admission, onward)           Start     Ordered   07/01/21 0000  Discharge wound care:       Comments: Per home health RN   07/01/21 1200            Discharge Instructions: Please refer to Patient Instructions section of EMR for full details.  Patient was counseled important signs and symptoms that should prompt return to medical care, changes in medications, dietary instructions, activity restrictions, and follow up appointments.   Follow-Up Appointments:  Follow-up Information     [name], MD. Schedule an appointment as soon as possible for a visit in 1 week(s).   Specialty: Family Medicine Why: Make  a hospital follow up with your primary care doctor. This should be about one week after hospital discharge. Contact information: [place] [address] [phone]         [name], Peter M, MD .   Specialty: Cardiology Contact information: [address] [place] [address] [place], [facility] Follow up.   Specialty: Home Health Services Why: Home Health RN/PT/aide arranged. They will contact you within 1 to 2 days of discharge. Contact information: [place] [address] [phone]                 Rosezetta Schlatter, MD 07/01/2021, 1:25 PM PGY-1, [place]

## 2021-07-01 NOTE — Plan of Care (Signed)
  Problem: Education: Goal: Knowledge of General Education information will improve Description: Including pain rating scale, medication(s)/side effects and non-pharmacologic comfort measures Outcome: Progressing   Problem: Activity: Goal: Risk for activity intolerance will decrease Outcome: Progressing   Problem: Nutrition: Goal: Adequate nutrition will be maintained Outcome: Progressing   Problem: Coping: Goal: Level of anxiety will decrease Outcome: Progressing   Problem: Pain Managment: Goal: General experience of comfort will improve Outcome: Progressing   Problem: Skin Integrity: Goal: Risk for impaired skin integrity will decrease Outcome: Progressing   

## 2021-07-01 NOTE — Progress Notes (Addendum)
Subjective: Seen on dialysis at start denies any shortness of breath or chest pain or new abdominal pain.  Objective Vital signs in last 24 hours: Vitals:   07/01/21 0325 07/01/21 0813 07/01/21 0836 07/01/21 0914  BP: 100/71 (!) 151/57 (!) 114/55   Pulse: 64 73 66   Resp: '18 18 13   '$ Temp: 97.6 F (36.4 C) 97.6 F (36.4 C) 98.6 F (37 C)   TempSrc: Axillary Oral Oral   SpO2:  99% 100%   Weight:    98 kg  Height:       Weight change:   Physical Exam: General: Obese elderly female chronically ill,.  Pleasantly confused(about baseline)  NAD Heart: RRR no MRG Lungs: CTA bilaterally nonlabored breathing nasal cannula oxygen Abdomen: Obese, NABS, soft left lower quadrant wound dressing dry clear ileostomy bag with stool present  Extremities: No pedal edema Dialysis Access: Left IJ TDC       Home meds include - tylenol, albuterol, amiodarone, apixaban, clonazepam, famotidine, fluticasone, hydromorphone, levalbuterol, metoclopramide, midodrine 10 tid, morphine IR, renavit, O2 3 L, promethazine, synthroid, cyanocobalamin        OP HD: [facility] TTS  3.5h  400/1.5  87.5kg  2/2 bath TDC  Hep 2700   - mircera 225 mcg q2, last 5/18, due 6/01 - rocaltrol 0.5 mcg tiw po - last HD 5/25, 90.1 > 89.9kg     Problem/Plan: Sepsis - w/ chills, AMS at home.  Etiology thought to be abdominal wound, on admit high HR and soft BP's in ED.   Admit team started on broad spec IV abx.  And wound care manage ESRD - on HD TTS.  K3.5 HD today on schedule  midodrine on dialysis days for BP control K3.9, NA 134 this a.m. Hypotension/ volume - BP's stable this a.m. for her and asymptomatic.  Cont midodrine 3 times daily Anemia esrd -Hgb 9 4this a.m.  transfusion  1P RBC last wk  transfuse prn.  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 will hold vitamin D phosphorus 3.9 on Renvela PAF - on eliquis, amiodarone SR on exam, Rx per pmd Chronic anxiety /AMS on admit(now cleared  probably related to sepsis) Rx per admit  team  Ernest Haber, PA-C [facility] Beeper [phone] 07/01/2021,9:19 AM  LOS: 7 days   Labs: Basic Metabolic Panel: Recent Labs  Lab 06/24/21 1310 06/25/21 0427 06/28/21 0935 06/30/21 0046 07/01/21 0146  NA  --    < > 134* 134* 134*  K  --    < > 3.5 3.9 3.5  CL  --    < > 99 100 102  CO2  --    < > '25 23 24  '$ GLUCOSE  --    < > 130* 121* 120*  BUN  --    < > '18 19 23  '$ CREATININE  --    < > 5.19* 5.10* 6.26*  CALCIUM  --    < > 8.5* 8.8* 9.0  PHOS 8.1*  --  4.8*  --  3.9   < > = values in this interval not displayed.   Liver Function Tests: Recent Labs  Lab 06/28/21 0935 07/01/21 0146  ALBUMIN 2.2* 2.1*   No results for input(s): LIPASE, AMYLASE in the last 168 hours. No results for input(s): AMMONIA in the last 168 hours. CBC: Recent Labs  Lab 06/25/21 0427 06/25/21 1331 06/26/21 1915 06/27/21 0118 06/28/21 0607 06/30/21 0046 07/01/21 0146  WBC 11.8*   < >  9.3 8.2 9.7 9.2 10.6*  NEUTROABS 9.1*  --   --   --   --   --   --   HGB 6.5*   < > 8.7* 8.7* 8.8* 9.3* 9.4*  HCT 22.5*   < > 28.9* 29.6* 29.9* 31.5* 32.2*  MCV 79.2*   < > 81.0 82.5 83.5 84.2 84.1  PLT 335   < > 273 271 262 194 152   < > = values in this interval not displayed.   Cardiac Enzymes: Recent Labs  Lab 06/26/21 0151 07/01/21 0146  CKTOTAL 32* 15*   CBG: Recent Labs  Lab 06/27/21 1604 06/28/21 1602 06/29/21 0745 06/29/21 1214 06/29/21 1559  GLUCAP 87 109* 126* 125* 182*    Studies/Results: No results found. Medications:  anticoagulant sodium citrate      amiodarone  200 mg Oral Daily   apixaban  2.5 mg Oral BID   calcitRIOL  0.5 mcg Oral Q T,Th,Sa-HD   Chlorhexidine Gluconate Cloth  6 each Topical Q0600   clonazePAM  0.5 mg Oral Once per day on Tue Thu Sat   clonazePAM  1 mg Oral Once per day on Sun Mon Wed Fri   darbepoetin (ARANESP) injection - DIALYSIS  200 mcg Intravenous Q Thu-HD   famotidine  20 mg Oral Q12H   levothyroxine  175 mcg Oral QAC  breakfast   metoCLOPramide  5 mg Oral TID AC & HS   midodrine  10 mg Oral TID   sevelamer carbonate  800 mg Oral TID WC   vitamin B-12  500 mcg Oral Daily

## 2021-07-01 NOTE — Progress Notes (Signed)
PT Cancellation Note  Patient Details Name: [name] MRN: [id number] DOB: 05/20/1940   Cancelled Treatment:    Reason Eval/Treat Not Completed: Patient at procedure or test/unavailable  Patient off the unit for dialysis.    [place]  Office [phone]  Rexanne Mano 07/01/2021, 10:41 AM

## 2021-07-02 ENCOUNTER — Other Ambulatory Visit: Payer: Self-pay | Admitting: Family Medicine

## 2021-07-02 ENCOUNTER — Encounter: Payer: Self-pay | Admitting: Internal Medicine

## 2021-07-02 ENCOUNTER — Telehealth: Payer: Self-pay

## 2021-07-02 DIAGNOSIS — I480 Paroxysmal atrial fibrillation: Secondary | ICD-10-CM

## 2021-07-02 MED ORDER — APIXABAN 2.5 MG PO TABS: ORAL_TABLET | ORAL | 1 refills | Status: DC

## 2021-07-02 NOTE — Telephone Encounter (Signed)
Daughter calls nurse line again checking status.

## 2021-07-02 NOTE — Telephone Encounter (Signed)
Patients daughter calls nurse line in regards to hospital bed.   Daughter reports the bed ordered is too small for patient.   Daughter reports she contacted [employer] and a new order needs to be sent in for a bariatric hospital bed.   Will forward to PCP.

## 2021-07-02 NOTE — Progress Notes (Signed)
Received call from 5 W. Pt dc from taht unit yesterday. Daughter called the unit to report that the patient's eliquis was not sent in on dc. I checked and ot looked like dr. Adah Salvage HAD rx it but I sent it again as well. Dorcas Mcmurray

## 2021-07-03 NOTE — Telephone Encounter (Signed)
Called [name]'s line after daughter called the nursing line to explain the DME hospital bed that was delivered was too small for patient.  Spoke with patient's caretaker who happens to be her daughter, [name].  Informed [name] unfortunately patient did not qualify for the heavy-duty/bariatric bed they are requesting.  In order to qualify for a heavy-duty bed patient needs to be over 300 pounds and [name] does not qualify given her most recent weight is 216 pounds.  Daughter verbalized understanding and plan to call [facility] tomorrow.

## 2021-07-04 NOTE — Telephone Encounter (Signed)
Spoke with [employer].   [name] reports we needs girth measurements to override weight requirements.   Spoke with daughter and advised her to measure with width of her mother.   Daughter will call me on Monday with updated measurements.

## 2021-07-04 NOTE — Telephone Encounter (Signed)
Daughter calls nurse line in regards to bariatric bed.   Daughter reports she spoke with [facility] and they will cover bariatric bed at 100%. Daughter reports [employer] is the one who is refusing due to weight requirements.   Daughter reports she called [employer] and spoke with Chassidy and explained to her the need for the bed even though she is under weight limit. After discussion [employer] may be willing to cover the bed if we reach out to them and describe her need for bariatric bed.   I attempted to call [place], however I got her VM.   VM left asking her to call me back to discuss patient.

## 2021-07-09 ENCOUNTER — Other Ambulatory Visit: Payer: Self-pay | Admitting: Student

## 2021-07-09 DIAGNOSIS — L03319 Cellulitis of trunk, unspecified: Secondary | ICD-10-CM

## 2021-07-09 NOTE — Progress Notes (Signed)
Order for heavy duty DME hospital bed. Pt measurement included Waist: 66.5in Chest: 58in Hips: 59.5in Height: 88f 3in

## 2021-07-09 NOTE — Telephone Encounter (Signed)
Daughter returns call to nurse line with measurements.  Please add to order below measurements.   Waist: 66.5in Chest: 58in Hips: 59.5in Height: 29f 3in  Please let me know when order has been updated so I can update [employer].

## 2021-07-10 NOTE — Telephone Encounter (Signed)
Community message sent to [employer] with updated measurements.   Will await confirmation.

## 2021-07-10 NOTE — Telephone Encounter (Signed)
Message received from [employer]:   "I will look and see, it's hard to get someone qualified if they don't meet that weight requirement, unfortunately."  Will continued to check in until final determination.

## 2021-07-12 ENCOUNTER — Other Ambulatory Visit: Payer: Self-pay | Admitting: Family Medicine

## 2021-07-12 ENCOUNTER — Other Ambulatory Visit: Payer: Self-pay | Admitting: Student

## 2021-07-12 DIAGNOSIS — E1143 Type 2 diabetes mellitus with diabetic autonomic (poly)neuropathy: Secondary | ICD-10-CM

## 2021-07-16 ENCOUNTER — Ambulatory Visit: Payer: Medicare HMO | Admitting: Family Medicine

## 2021-07-20 NOTE — Progress Notes (Deleted)
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 with a trivial PFO.   2. Left ventricular ejection fraction, by estimation, is 50 to 55%. The  left ventricle has low normal function. The left ventricle has no regional  wall motion abnormalities.   3. Right ventricular systolic function is normal. The right ventricular  size is normal.   4. Left atrial size was mild to moderately dilated. No left atrial/left  atrial appendage thrombus was detected. The LAA emptying velocity was 54  cm/s.   5. A small pericardial effusion is present. The pericardial effusion is  circumferential. There is no evidence of cardiac tamponade.   6. The mitral valve is grossly normal. Trivial mitral valve  regurgitation. No evidence of mitral stenosis.   7. The aortic valve is tricuspid. There is mild calcification of the  aortic valve. There is mild thickening of the aortic valve. Aortic valve  regurgitation is not visualized. Mild aortic valve sclerosis is present,  with no evidence of aortic valve  stenosis.   8. There is mild (Grade II) layered plaque involving the  transverse aorta  and descending aorta.   9. Agitated saline contrast bubble study was positive with shunting  observed within 3-6 cardiac cycles suggestive of interatrial shunt. There  is a small patent foramen ovale with predominantly right to left shunting  across the atrial septum.   Conclusion(s)/Recommendation(s): No evidence of vegetation/infective  endocarditis on this transesophageal  echocardiogram.   Echo 02/19/21: IMPRESSIONS     1. Left ventricular ejection fraction, by estimation, is 55%. The left  ventricle has normal function. The left ventricle has no regional wall  motion abnormalities. There is mild left ventricular hypertrophy. Left  ventricular diastolic parameters are  consistent with Grade II diastolic dysfunction (pseudonormalization).   2. Right ventricular systolic function is normal. The right ventricular  size is mildly enlarged. Tricuspid regurgitation signal is inadequate for  assessing PA pressure.   3. Left atrial size was mildly dilated.   4. Right atrial size was moderately dilated.   5. The mitral valve is degenerative. No evidence of mitral valve  regurgitation. No evidence of mitral stenosis. Moderate mitral annular  calcification.   6. The aortic valve is tricuspid. Aortic valve regurgitation is not  visualized. The aortic valve was heavily calcified and visually appeared  stenotic. However, an elevated mean gradient was not measured.   7. Aortic dilatation noted. There is mild dilatation of the ascending  aorta, measuring 38 mm.   8. The inferior vena cava is normal in size with greater than 50%  respiratory variability, suggesting right atrial pressure of 3 mmHg.    ASSESSMENT:    No diagnosis found.     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  On no antianginal therapy due to hypotension.    2.   ESRD. Now on HD.  Hypotension during dialysis and at other times. Now on midodrine daily.  Could increase to tid if needed.   3.   Atrial fibrillation/paroxysmal: appears to be maintaining NSR. Continue amiodarone and Eliquis- at lower dose.  4.   Chronic systolic heart failure: Euvolemic on physical exam. Volume status maintained with dialysis. Last EF 50-55%.  5.  DM 2:     I will follow up in 4 months.    Medication Adjustments/Labs and Tests Ordered: Current medicines are reviewed at length with the patient today.  Concerns regarding medicines are outlined above.  Medication changes, Labs and Tests ordered today are listed in the Patient Instructions below. There are no Patient Instructions on file for this visit.   Signed, Tsuneo Faison [name], MD,FACC 07/20/2021 1:08 PM    [facility]

## 2021-07-23 ENCOUNTER — Ambulatory Visit: Payer: Medicare HMO | Admitting: Cardiology

## 2021-07-23 ENCOUNTER — Other Ambulatory Visit: Payer: Self-pay | Admitting: Student

## 2021-07-23 ENCOUNTER — Telehealth: Payer: Self-pay

## 2021-07-23 DIAGNOSIS — L98491 Non-pressure chronic ulcer of skin of other sites limited to breakdown of skin: Secondary | ICD-10-CM

## 2021-07-23 NOTE — Telephone Encounter (Signed)
[name] calls nurse line requesting assistive device to help patient transfer from wheelchair to bed.   Daughter reports that she has injured herself in the process of helping patient with these transfers.   Request for a new order for a DME hoyer lift to help assist with these transfers.   Please route back to RN team once order has been placed.   Thanks.   Talbot Grumbling, RN

## 2021-07-24 NOTE — Telephone Encounter (Signed)
DME order for [facility] lift

## 2021-07-24 NOTE — Telephone Encounter (Signed)
Order pended for provider.

## 2021-07-25 NOTE — Telephone Encounter (Signed)
Community message sent to [employer]. Will await response.   Jaz Laningham C Arles Rumbold, RN  

## 2021-07-25 NOTE — Telephone Encounter (Signed)
See below message from [employer].   Received,thank you!   Talbot Grumbling, RN

## 2021-08-04 ENCOUNTER — Ambulatory Visit (INDEPENDENT_AMBULATORY_CARE_PROVIDER_SITE_OTHER): Payer: Medicare HMO | Admitting: Student

## 2021-08-04 ENCOUNTER — Encounter: Payer: Self-pay | Admitting: Student

## 2021-08-04 VITALS — BP 118/65 | HR 65 | Ht 62.0 in

## 2021-08-04 DIAGNOSIS — N186 End stage renal disease: Secondary | ICD-10-CM

## 2021-08-04 DIAGNOSIS — Z992 Dependence on renal dialysis: Secondary | ICD-10-CM

## 2021-08-04 DIAGNOSIS — L98499 Non-pressure chronic ulcer of skin of other sites with unspecified severity: Secondary | ICD-10-CM | POA: Diagnosis not present

## 2021-08-04 DIAGNOSIS — R609 Edema, unspecified: Secondary | ICD-10-CM

## 2021-08-04 NOTE — Assessment & Plan Note (Signed)
Continue to follow with dermatology.

## 2021-08-04 NOTE — Patient Instructions (Signed)
It was great to see you! Thank you for allowing me to participate in your care!  I recommend that you always bring your medications to each appointment as this makes it easy to ensure you are on the correct medications and helps [place] not miss when refills are needed.  Our plans for today:  - Go to dialysis tomorrow -If you begin to feel more confused, your heart is racing, you feel dizzy or light headed, short of breath or have chest pain, these are reasons to seek emergent care.   We are checking some labs today, I will call you if they are abnormal will send you a MyChart message or a letter if they are normal.  If you do not hear about your labs in the next 2 weeks please let us know.  Take care and seek immediate care sooner if you develop any concerns.   Dr. Precious Gilding, DO [facility]

## 2021-08-04 NOTE — Assessment & Plan Note (Signed)
Pitting edema and fatigue are most likely due to to patient missing dialysis. I advised patient get her dialysis tomorrow as scheduled.  I do not think patient needs to be sent to the ED at this point as vitals are stable and physical exam is reassuring.  I will order a CBC and BMP STAT as patient has history requiring hospitalization in the setting of missed dialysis and recently was hospitalized due to sepsis.  Strict return precautions given.

## 2021-08-04 NOTE — Progress Notes (Signed)
    SUBJECTIVE:   CHIEF COMPLAINT / HPI:   Fatigue and edema Went to dialysis Thursday and Thursday afternoon felt very tired and then started vomiting late that night into Friday afternoon. Daughter gave her Pedialyte and Gatorade on Friday. Wasn't able to go to dialysis on Saturday d/t not feeling well but was no longer vomiting. Daughter states she starts her hands and feet have been swollen for past 2 days and she seems more confused.  Pt states she has been feeling very tired and sleepy for the past 3 days. Also states she is having a difficult time sleeping. Coughing occasionally at night which is normal. No chest pain. Has been making urine on occasion. No diarrhea, no blood in ostomy bag, no cold like symptoms.   Abdominal wound Pt was seen by dermatology on Wed, Dr. [place] Crane who prescribed a prednisone taper and he had previously prescribed a 2 week course of cephalexin which she has been taking and will finish later this week.  [name] provided biopsy report of wound which was done by dermatologist which will be scanned into the computer system.  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  PERTINENT  PMH / PSH: ESRD, chronic abdominal wound  OBJECTIVE:   BP 118/65   Pulse 65   Ht '5\' 2"'$  (1.575 m)   SpO2 100%   BMI 39.52 kg/m    General: NAD, pleasant, able to participate in exam Cardiac: RRR, no murmurs. Respiratory: CTAB, normal effort, No wheezes, rales or rhonchi Abdomen: Bowel sounds present, nontender, nondistended, ostomy bag in place with soft brown stool, chronic wound of left abdominal wall, clean, no drainage, no erythema, bandage in place Extremities: 2+ pitting edema of BLEs up to top of shin, mild edema of bilateral hands Neuro: alert, no obvious focal deficits Psych: Normal affect and mood  ASSESSMENT/PLAN:   2+ pitting edema Pitting edema and fatigue are most likely due to to patient missing dialysis. I advised  patient get her dialysis tomorrow as scheduled.  I do not think patient needs to be sent to the ED at this point as vitals are stable and physical exam is reassuring.  I will order a CBC and BMP STAT as patient has history requiring hospitalization in the setting of missed dialysis and recently was hospitalized due to sepsis.  Strict return precautions given.   Abdominal wound -Continue to follow dermatology and their recommendations  Dr. Precious Gilding, [place]

## 2021-08-05 ENCOUNTER — Other Ambulatory Visit: Payer: Self-pay

## 2021-08-05 ENCOUNTER — Observation Stay (HOSPITAL_COMMUNITY)
Admission: EM | Admit: 2021-08-05 | Discharge: 2021-08-12 | Disposition: A | Discharge status: Home Health | Payer: Medicare HMO | Attending: Family Medicine | Admitting: Family Medicine

## 2021-08-05 ENCOUNTER — Telehealth: Payer: Self-pay | Admitting: Student

## 2021-08-05 ENCOUNTER — Emergency Department (HOSPITAL_COMMUNITY): Payer: Medicare HMO

## 2021-08-05 ENCOUNTER — Encounter (HOSPITAL_COMMUNITY): Payer: Self-pay | Admitting: Family Medicine

## 2021-08-05 DIAGNOSIS — L89151 Pressure ulcer of sacral region, stage 1: Secondary | ICD-10-CM

## 2021-08-05 DIAGNOSIS — L98499 Non-pressure chronic ulcer of skin of other sites with unspecified severity: Secondary | ICD-10-CM | POA: Diagnosis not present

## 2021-08-05 DIAGNOSIS — R627 Adult failure to thrive: Secondary | ICD-10-CM | POA: Diagnosis not present

## 2021-08-05 DIAGNOSIS — A419 Sepsis, unspecified organism: Secondary | ICD-10-CM

## 2021-08-05 DIAGNOSIS — R5383 Other fatigue: Secondary | ICD-10-CM | POA: Diagnosis present

## 2021-08-05 DIAGNOSIS — M6281 Muscle weakness (generalized): Secondary | ICD-10-CM | POA: Diagnosis not present

## 2021-08-05 DIAGNOSIS — Z20822 Contact with and (suspected) exposure to covid-19: Secondary | ICD-10-CM | POA: Insufficient documentation

## 2021-08-05 DIAGNOSIS — I480 Paroxysmal atrial fibrillation: Secondary | ICD-10-CM

## 2021-08-05 DIAGNOSIS — Z7901 Long term (current) use of anticoagulants: Secondary | ICD-10-CM | POA: Diagnosis not present

## 2021-08-05 DIAGNOSIS — N186 End stage renal disease: Secondary | ICD-10-CM | POA: Diagnosis not present

## 2021-08-05 DIAGNOSIS — Z992 Dependence on renal dialysis: Secondary | ICD-10-CM | POA: Insufficient documentation

## 2021-08-05 DIAGNOSIS — I132 Hypertensive heart and chronic kidney disease with heart failure and with stage 5 chronic kidney disease, or end stage renal disease: Principal | ICD-10-CM | POA: Insufficient documentation

## 2021-08-05 DIAGNOSIS — I5043 Acute on chronic combined systolic (congestive) and diastolic (congestive) heart failure: Secondary | ICD-10-CM | POA: Insufficient documentation

## 2021-08-05 DIAGNOSIS — I959 Hypotension, unspecified: Secondary | ICD-10-CM | POA: Diagnosis not present

## 2021-08-05 DIAGNOSIS — E1122 Type 2 diabetes mellitus with diabetic chronic kidney disease: Secondary | ICD-10-CM | POA: Diagnosis not present

## 2021-08-05 DIAGNOSIS — N189 Chronic kidney disease, unspecified: Secondary | ICD-10-CM | POA: Diagnosis not present

## 2021-08-05 DIAGNOSIS — J189 Pneumonia, unspecified organism: Secondary | ICD-10-CM

## 2021-08-05 DIAGNOSIS — G4700 Insomnia, unspecified: Secondary | ICD-10-CM | POA: Diagnosis present

## 2021-08-05 DIAGNOSIS — R2689 Other abnormalities of gait and mobility: Secondary | ICD-10-CM | POA: Diagnosis not present

## 2021-08-05 DIAGNOSIS — F0390 Unspecified dementia without behavioral disturbance: Secondary | ICD-10-CM | POA: Insufficient documentation

## 2021-08-05 DIAGNOSIS — D649 Anemia, unspecified: Secondary | ICD-10-CM | POA: Diagnosis present

## 2021-08-05 DIAGNOSIS — D631 Anemia in chronic kidney disease: Secondary | ICD-10-CM | POA: Diagnosis not present

## 2021-08-05 DIAGNOSIS — I4891 Unspecified atrial fibrillation: Secondary | ICD-10-CM | POA: Diagnosis present

## 2021-08-05 DIAGNOSIS — Z79899 Other long term (current) drug therapy: Secondary | ICD-10-CM | POA: Insufficient documentation

## 2021-08-05 DIAGNOSIS — Z85038 Personal history of other malignant neoplasm of large intestine: Secondary | ICD-10-CM | POA: Diagnosis not present

## 2021-08-05 DIAGNOSIS — Z91040 Latex allergy status: Secondary | ICD-10-CM | POA: Diagnosis not present

## 2021-08-05 DIAGNOSIS — I2510 Atherosclerotic heart disease of native coronary artery without angina pectoris: Secondary | ICD-10-CM | POA: Insufficient documentation

## 2021-08-05 DIAGNOSIS — R7401 Elevation of levels of liver transaminase levels: Secondary | ICD-10-CM | POA: Diagnosis not present

## 2021-08-05 DIAGNOSIS — E039 Hypothyroidism, unspecified: Secondary | ICD-10-CM | POA: Insufficient documentation

## 2021-08-05 DIAGNOSIS — M7989 Other specified soft tissue disorders: Secondary | ICD-10-CM | POA: Diagnosis not present

## 2021-08-05 DIAGNOSIS — Z85528 Personal history of other malignant neoplasm of kidney: Secondary | ICD-10-CM | POA: Diagnosis not present

## 2021-08-05 DIAGNOSIS — R531 Weakness: Secondary | ICD-10-CM

## 2021-08-05 DIAGNOSIS — Z8616 Personal history of COVID-19: Secondary | ICD-10-CM | POA: Diagnosis not present

## 2021-08-05 DIAGNOSIS — E1143 Type 2 diabetes mellitus with diabetic autonomic (poly)neuropathy: Secondary | ICD-10-CM | POA: Diagnosis present

## 2021-08-05 HISTORY — DX: Dependence on renal dialysis: N18.6

## 2021-08-05 LAB — CBC
Hematocrit: 26.5 % — ABNORMAL LOW (ref 34.0–46.6)
Hemoglobin: 7.7 g/dL — ABNORMAL LOW (ref 11.1–15.9)
MCH: 23.1 pg — ABNORMAL LOW (ref 26.6–33.0)
MCHC: 29.1 g/dL — ABNORMAL LOW (ref 31.5–35.7)
MCV: 80 fL (ref 79–97)
Platelets: 335 10*3/uL (ref 150–450)
RBC: 3.33 x10E6/uL — ABNORMAL LOW (ref 3.77–5.28)
RDW: 19.6 % — ABNORMAL HIGH (ref 11.7–15.4)
WBC: 12.2 10*3/uL — ABNORMAL HIGH (ref 3.4–10.8)

## 2021-08-05 LAB — BASIC METABOLIC PANEL
BUN/Creatinine Ratio: 8 — ABNORMAL LOW (ref 12–28)
BUN: 64 mg/dL — ABNORMAL HIGH (ref 8–27)
CO2: 22 mmol/L (ref 20–29)
Calcium: 9.1 mg/dL (ref 8.7–10.3)
Chloride: 98 mmol/L (ref 96–106)
Creatinine, Ser: 8.13 mg/dL — ABNORMAL HIGH (ref 0.57–1.00)
Glucose: 195 mg/dL — ABNORMAL HIGH (ref 70–99)
Potassium: 5.8 mmol/L — ABNORMAL HIGH (ref 3.5–5.2)
Sodium: 131 mmol/L — ABNORMAL LOW (ref 134–144)
eGFR: 5 mL/min/{1.73_m2} — ABNORMAL LOW (ref >59)

## 2021-08-05 LAB — COMPREHENSIVE METABOLIC PANEL
ALT: 33 U/L (ref 0–44)
AST: 65 U/L — ABNORMAL HIGH (ref 15–41)
Albumin: 2.5 g/dL — ABNORMAL LOW (ref 3.5–5.0)
Alkaline Phosphatase: 217 U/L — ABNORMAL HIGH (ref 38–126)
Anion gap: 16 — ABNORMAL HIGH (ref 5–15)
BUN: 28 mg/dL — ABNORMAL HIGH (ref 8–23)
CO2: 24 mmol/L (ref 22–32)
Calcium: 8 mg/dL — ABNORMAL LOW (ref 8.9–10.3)
Chloride: 95 mmol/L — ABNORMAL LOW (ref 98–111)
Creatinine, Ser: 4.57 mg/dL — ABNORMAL HIGH (ref 0.44–1.00)
GFR, Estimated: 9 mL/min — ABNORMAL LOW (ref >60)
Glucose, Bld: 158 mg/dL — ABNORMAL HIGH (ref 70–99)
Potassium: 4.4 mmol/L (ref 3.5–5.1)
Sodium: 135 mmol/L (ref 135–145)
Total Bilirubin: 0.8 mg/dL (ref 0.3–1.2)
Total Protein: 6 g/dL — ABNORMAL LOW (ref 6.5–8.1)

## 2021-08-05 LAB — CBC WITH DIFFERENTIAL/PLATELET
Abs Immature Granulocytes: 0.1 10*3/uL — ABNORMAL HIGH (ref 0.00–0.07)
Basophils Absolute: 0 10*3/uL (ref 0.0–0.1)
Basophils Relative: 0 %
Eosinophils Absolute: 0 10*3/uL (ref 0.0–0.5)
Eosinophils Relative: 0 %
HCT: 28.8 % — ABNORMAL LOW (ref 36.0–46.0)
Hemoglobin: 8.1 g/dL — ABNORMAL LOW (ref 12.0–15.0)
Immature Granulocytes: 1 %
Lymphocytes Relative: 7 %
Lymphs Abs: 0.8 10*3/uL (ref 0.7–4.0)
MCH: 23.5 pg — ABNORMAL LOW (ref 26.0–34.0)
MCHC: 28.1 g/dL — ABNORMAL LOW (ref 30.0–36.0)
MCV: 83.7 fL (ref 80.0–100.0)
Monocytes Absolute: 1.2 10*3/uL — ABNORMAL HIGH (ref 0.1–1.0)
Monocytes Relative: 9 %
Neutro Abs: 10.8 10*3/uL — ABNORMAL HIGH (ref 1.7–7.7)
Neutrophils Relative %: 83 %
Platelets: 269 10*3/uL (ref 150–400)
RBC: 3.44 MIL/uL — ABNORMAL LOW (ref 3.87–5.11)
RDW: 18.7 % — ABNORMAL HIGH (ref 11.5–15.5)
WBC: 13 10*3/uL — ABNORMAL HIGH (ref 4.0–10.5)
nRBC: 0.2 % (ref 0.0–0.2)

## 2021-08-05 LAB — POC OCCULT BLOOD, ED: Fecal Occult Bld: POSITIVE — AB

## 2021-08-05 LAB — I-STAT CHEM 8, ED
BUN: 35 mg/dL — ABNORMAL HIGH (ref 8–23)
Calcium, Ion: 0.93 mmol/L — ABNORMAL LOW (ref 1.15–1.40)
Chloride: 99 mmol/L (ref 98–111)
Creatinine, Ser: 5.4 mg/dL — ABNORMAL HIGH (ref 0.44–1.00)
Glucose, Bld: 129 mg/dL — ABNORMAL HIGH (ref 70–99)
HCT: 24 % — ABNORMAL LOW (ref 36.0–46.0)
Hemoglobin: 8.2 g/dL — ABNORMAL LOW (ref 12.0–15.0)
Potassium: 4.2 mmol/L (ref 3.5–5.1)
Sodium: 135 mmol/L (ref 135–145)
TCO2: 27 mmol/L (ref 22–32)

## 2021-08-05 LAB — SARS CORONAVIRUS 2 BY RT PCR: SARS Coronavirus 2 by RT PCR: NEGATIVE

## 2021-08-05 LAB — LACTIC ACID, PLASMA
Lactic Acid, Venous: 3.4 mmol/L (ref 0.5–1.9)
Lactic Acid, Venous: 2.4 mmol/L (ref 0.5–1.9)

## 2021-08-05 LAB — TROPONIN I (HIGH SENSITIVITY)
Troponin I (High Sensitivity): 30 ng/L — ABNORMAL HIGH (ref <18)
Troponin I (High Sensitivity): 29 ng/L — ABNORMAL HIGH (ref <18)

## 2021-08-05 MED ORDER — METOCLOPRAMIDE HCL 5 MG PO TABS
5.0000 mg | ORAL_TABLET | Freq: Three times a day (TID) | ORAL | Status: DC
  Administered 2021-08-05 – 2021-08-12 (×26): 5 mg via ORAL
  Filled 2021-08-05 (×26): qty 1

## 2021-08-05 MED ORDER — ACETAMINOPHEN 325 MG PO TABS
650.0000 mg | ORAL_TABLET | Freq: Four times a day (QID) | ORAL | Status: DC | PRN
  Administered 2021-08-09: 650 mg via ORAL
  Filled 2021-08-05: qty 2

## 2021-08-05 MED ORDER — CLONAZEPAM 0.5 MG PO TABS
0.5000 mg | ORAL_TABLET | ORAL | Status: DC
  Filled 2021-08-05: qty 1

## 2021-08-05 MED ORDER — MEDIHONEY WOUND/BURN DRESSING EX PSTE
1.0000 | PASTE | Freq: Every day | CUTANEOUS | Status: DC
Start: 2021-08-05 — End: 2021-08-06
  Filled 2021-08-05: qty 44

## 2021-08-05 MED ORDER — ACETAMINOPHEN 650 MG RE SUPP: 650.0000 mg | Freq: Four times a day (QID) | RECTAL | Status: DC | PRN

## 2021-08-05 MED ORDER — MIDODRINE HCL 5 MG PO TABS
10.0000 mg | ORAL_TABLET | Freq: Three times a day (TID) | ORAL | Status: DC
  Administered 2021-08-05 – 2021-08-12 (×21): 10 mg via ORAL
  Filled 2021-08-05 (×21): qty 2

## 2021-08-05 MED ORDER — RENA-VITE PO TABS
1.0000 | ORAL_TABLET | Freq: Every day | ORAL | Status: DC
  Administered 2021-08-05 – 2021-08-12 (×8): 1 via ORAL
  Filled 2021-08-05 (×9): qty 1

## 2021-08-05 MED ORDER — ONDANSETRON HCL 4 MG/2ML IJ SOLN
4.0000 mg | Freq: Once | INTRAMUSCULAR | Status: DC
  Filled 2021-08-05: qty 2

## 2021-08-05 MED ORDER — CLONAZEPAM 1 MG PO TABS
1.0000 mg | ORAL_TABLET | ORAL | Status: DC
  Administered 2021-08-06 – 2021-08-11 (×4): 1 mg via ORAL
  Filled 2021-08-05 (×4): qty 1

## 2021-08-05 MED ORDER — POLYETHYLENE GLYCOL 3350 17 G PO PACK: 17.0000 g | PACK | Freq: Every day | ORAL | Status: DC | PRN

## 2021-08-05 MED ORDER — LEVOTHYROXINE SODIUM 75 MCG PO TABS
175.0000 ug | ORAL_TABLET | Freq: Every day | ORAL | Status: DC
  Administered 2021-08-06 – 2021-08-12 (×7): 175 ug via ORAL
  Filled 2021-08-05 (×8): qty 1

## 2021-08-05 MED ORDER — FAMOTIDINE 20 MG PO TABS
10.0000 mg | ORAL_TABLET | Freq: Two times a day (BID) | ORAL | Status: DC
  Administered 2021-08-05 – 2021-08-12 (×15): 10 mg via ORAL
  Filled 2021-08-05 (×16): qty 1

## 2021-08-05 MED ORDER — SEVELAMER CARBONATE 800 MG PO TABS
800.0000 mg | ORAL_TABLET | Freq: Three times a day (TID) | ORAL | Status: DC
  Filled 2021-08-05 (×2): qty 1

## 2021-08-05 MED ORDER — SODIUM CHLORIDE 0.9 % IV SOLN
1.0000 g | INTRAVENOUS | Status: DC
  Administered 2021-08-05: 1 g via INTRAVENOUS
  Filled 2021-08-05 (×2): qty 10

## 2021-08-05 MED ORDER — SODIUM CHLORIDE 0.9 % IV BOLUS
500.0000 mL | Freq: Once | INTRAVENOUS | Status: AC
  Administered 2021-08-05: 500 mL via INTRAVENOUS

## 2021-08-05 MED ORDER — SODIUM CHLORIDE 0.9 % IV BOLUS: 1000.0000 mL | Freq: Once | INTRAVENOUS | Status: DC

## 2021-08-05 NOTE — Progress Notes (Addendum)
Pharmacy Antibiotic Note  [name] is a 81 y.o. female admitted on 08/05/2021 with PNA.  Pharmacy has been consulted for cefepime dosing. Note patient is on HD. Noted allergies, patient tolerated cefepime May 2023.   Plan: Cefepime 1g Q 24 hr Monitor cultures, clinical status Narrow abx as able and f/u duration      Temp (24hrs), Avg:97.4 F (36.3 C), Min:97.4 F (36.3 C), Max:97.4 F (36.3 C)  Recent Labs  Lab 08/04/21 1517 08/05/21 1641  WBC 12.2* 13.0*  CREATININE 8.13* 4.57*    CrCl cannot be calculated (Unknown ideal weight.).    Allergies  Allergen Reactions   Adhesive [Tape] Itching, Swelling, Rash and Other (See Comments)    Tears skin and causes blisters also. EKG pads will cause welts   Avelox [Moxifloxacin] Swelling and Rash   Banana Anaphylaxis and Other (See Comments)    Blisters appear also   Blueberry Flavor Anaphylaxis   Cantaloupe Extract Allergy Skin Test Anaphylaxis and Other (See Comments)    Blisters appear also   Cefprozil Shortness Of Breath, Rash and Other (See Comments)    Tolerated ceftriaxone on 06/20/20   Cetacaine [Butamben-Tetracaine-Benzocaine] Nausea And Vomiting and Swelling   Dicyclomine Nausea And Vomiting and Other (See Comments)    "Heart trouble"; Headaches and increased blood sugars   Food Anaphylaxis and Other (See Comments)    Melons- throat closes and blisters appear   Imdur [Isosorbide Nitrate] Hives, Palpitations, Other (See Comments) and Rash    Headaches also   Januvia [Sitagliptin] Shortness Of Breath   Lipitor [Atorvastatin] Shortness Of Breath   Losartan Potassium Shortness Of Breath   Nitroglycerin Other (See Comments)    Caused cardiac arrest and feels like skin bring torn off back of head    Omeprazole Shortness Of Breath and Swelling   Oxycodone Hives, Rash and Other (See Comments)    Tolerates Dilaudid    Penicillins Anaphylaxis    Has patient had a PCN reaction causing immediate rash,  facial/tongue/throat swelling, SOB or lightheadedness with hypotension: Yes Has patient had a PCN reaction causing severe rash involving mucus membranes or skin necrosis: No Has patient had a PCN reaction that required hospitalization Yes Has patient had a PCN reaction occurring within the last 10 years: No   Prednisone Anaphylaxis   Vancomycin Anaphylaxis   Watermelon [Citrullus Vulgaris] Anaphylaxis and Other (See Comments)    Blisters appear also   Hydrocodone Hives and Other (See Comments)    Tolerates Dilaudid   Latex Rash and Other (See Comments)    Blisters    Tamiflu [Oseltamivir] Other (See Comments)    Contraindicated with other medications Patient on tikosyn, and tamiflu interfered with anti arrhythmic med Contraindicated with other medications Patient on tikosyn, and tamiflu interfered with anti arrhythmic med   Feraheme [Ferumoxytol] Other (See Comments)    Sharp pain to lower back and flank   Other Other (See Comments)    Hydrogen - unknown- patient does not recall this (??)   Lasix [Furosemide] Hives, Swelling and Rash   Mupirocin Rash    Antimicrobials this admission: Cefe 7/11 >>   Microbiology results: 7/11 BCx: pend     Thank you for allowing pharmacy to be a part of this patient's care.  Benetta Spar, PharmD, BCPS, BCCP Clinical Pharmacist  Please check AMION for all [place] phone numbers After 10:00 PM, call [place] [phone]

## 2021-08-05 NOTE — Assessment & Plan Note (Addendum)
Cause is AVMs, found on prior endoscopies, in setting of Apixaban.  - Transfusion threshold <8.  S/p 1 unit PRBC (7/12) and IV iron (7/13).  -GI consulted, recs appreciated, no interventions planned. Plan for outpatient IV iron and transfusions as needed.  - If stoma bleeds, will apply silver nitrate  - Nephrology started darbe on Thurs dialysis sessions  - Continue holding Apixaban until end of the week, family and Dr. [name] (Cardiology) are in agreement

## 2021-08-05 NOTE — Progress Notes (Signed)
I have seen and evaluated this patient. I will discuss management plan with the oncall resident.

## 2021-08-05 NOTE — ED Notes (Signed)
Provider at bedside at this time in attempt to get USGPIV

## 2021-08-05 NOTE — ED Triage Notes (Signed)
Per EMS patient lives at home but is coming from dialysis today with a complaint of increasing malaise and fatigue over the last several weeks. Per Ems patient was found to be mildly hypotensive. Patient is alert and oriented. Patient walks with one assist usually. EMS calling the complaint a "near syncopal episode"

## 2021-08-05 NOTE — ED Notes (Addendum)
Provider Lavena Bullion, DO notified on secure chat of the patients desire for prednisone and also notified regarding the patient's low blood pressure readings. Patient repositioned along with blood pressure cuff at this time.

## 2021-08-05 NOTE — Assessment & Plan Note (Deleted)
Hg 8.1 today, 7.7 in clinic, 39moago 9.4

## 2021-08-05 NOTE — Assessment & Plan Note (Signed)
-   Continue home Reglan - Continue home Pepcid

## 2021-08-05 NOTE — Assessment & Plan Note (Signed)
Last last TSH from 02/16/2021 0.756. - Continue home levothyroxine

## 2021-08-05 NOTE — H&P (Cosign Needed)
Hospital Admission History and Physical Service Pager: [phone]  Patient name: [name] Medical record number: [id number] Date of Birth: Oct 18, 1940 Age: 81 y.o. Gender: female  Primary Care Provider: [name], MD Consultants: GI (consulted in ED)  Code Status: DNR Preferred Emergency Contact:  Contact Information     Name Relation Home Work Mobile   [name] Daughter [phone]  [phone]   [name] Relative [phone]     [name]   [phone]      Chief Complaint: Weakness   Assessment and Plan: [name] is a 81 y.o. female with pertinent PMHx of Lynch syndrome, colon cancer s/p right hemicolectomy, small bowel AVM, and renal cell cancer, now ESRD on dialysis presenting with weakness . Differential for this patient's presentation of this includes hypotension, acute on chronic anemia, deconditioning, metabolic encephalopathy, and stroke. Likely combination of fluid imbalance after missed dialysis session, with acute on chronic anemia given history of GI bleeds and ESRD. Given acute nature of patients weakness, unlikely FTT or deconditioning. Unlikely metabolic encephalopathy or stroke with reassuring Neuro exam.  * Weakness Patient presented with acute change in strength and ambulation. On exam, no focal deficits, strength 5/5 in extremities, unable to test ambulation. Likely due to fluid imbalance from missed dialysis session versus acute on chronic anemia. -Admit to FPTS med-tele, attending Dr. Gwendlyn Deutscher -PT/OT -Fall precautions -Delirium precautions -Tylenol as needed for pain  Acute on chronic anemia Patient presented with increased fatigue and weakness. Hg 8.1 on admission, 7.7 on 08/04/2021, baseline around 9.3.  MCV normocytic at 83.7.  Fecal occult blood test positive.  Concern for increased GI bleed versus worsening renal anemia. On Eliquis for A-fib. Transfusion threshold <8. -GI consulted in the ED recs appreciated -CBC in  a.m. -Hold Eliquis   ESRD (end stage renal disease) on dialysis [facility]) Patient presented today after missed dialysis session on 08/02/21, but attended session today (08/05/21) but unable to remove as much fluid as usual. Exam indicative of volume overload. Electrolytes stable. Some acidosis, LA 3.4, AG 16. Creatine 4.57 (BL ~5).  -Consult Nephrology for dialysis recs -RFP in AM -Renal diet -Strict I/O -Daily weights -Continue home Renvela  Elevated liver transaminase level AST elevated to 65. Alk Phos 217, total bili normal. Patient has no complaint of stomach pain.  No signs of jaundice on exam.  Denies alcohol use.  Hepatitis B surface antigen and hepatitis B S antibody nonreactive on 06/24/2021.  Patient on amiodarone at home which may be contributing.  -Hepatitis panel -CMP in AM   A-fib [facility]) Patient asymptomatic.  On exam and on telemetry patient in A-fib. - Hold home Eliquis in setting of GI bleed - Continue home amiodarone - Consider consulting cardiology for amiodarone dose adjustment in setting of elevated liver enzymes  Hypotension Blood pressure in the ED ranged from 24-469 systolically over 50H to 22V.  Patient is on midodrine at home.  White blood cell count elevated to 13.0 in the setting of prednisone course.  Chest x-ray in the ED showing bilateral pleural effusions cannot rule out pneumonia.  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  Concern for sepsis with lactic acid elevated to 3.4, repeat lactic acid 2.4.  Patient does not meet SIRS criteria with just elevated white count.  Normal respiratory rate, normal heart rate, afebrile.  Consider adrenal insufficiency but per daughter patient has only been on Prednisone since last week (with several missed doses since then). Patient also noted to be in  trendelenburg position during encounter which may also be contributing.  -Continue home midodrine- 10 mg TID  -Continue cefepime until 7/12 then  reassess  -Monitor vitals, try to maintain MAP >60 -Follow-up blood cultures -Follow-up a.m. blood cortisol   Skin ulcer of abdominal wall (HCC) On LLQ. 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 Was started on Prednisone burst, scheduled to finish 7/14 (but has missed two doses thus far). - Wound care consult, appreciate recommendations -Hold Prednisone   Hypothyroidism Last last TSH from 02/16/2021 0.756. - Continue home levothyroxine  Diabetic gastroparesis ([place]) - Continue home Reglan - Continue home Pepcid   FEN/GI: Renal diet VTE Prophylaxis: SCDs  Disposition: med-tele, pending PT eval  History of Present Illness:  [name] is a 81 y.o. female presenting with worsening fatigue weakness..   Weakness Patient states that she has had pain in her lower back and legs for the last few days. She also feels that perhaps she had too much fluid removed at dialysis today. She was seen in our clinic yesterday. Reports that today she was unable to get out of her wheelchair. This a new weakness since missing her dialysis session on Saturday. She was able to stand up previously but since she "got sick and missed dialysis" she was regressed- cannot stand up. Daughter wants to know "why her legs stopped moving".Because it did not improve with dialysis today, she decided to go to the emergency room. She also has PT that comes by and works on her.    Vomiting Also has chronic vomiting. She was recently diagnosed with gastroparesis and was advised to take Reglan four times a day. She was also advised to avoid several different foods that may aggravate this, such as tomato. She ate ketchup last week and started vomiting afterwards. Daughter felt that she was dehydrated and her PCP told her to drink Pedialyte. She started to feel weak and so she ended up missing her dialysis on Saturday. Her home health nurse came by the house and said that she  was fine but her symptoms were due to missed dialysis - so she went to dialysis today. Denies any bloody stools or vomiting. Has liquidy stools which is normal for her. Endorses good PO intake.  Breathing She has difficulty breathing "once in a while". Mostly at night. Daughter thought maybe it was related to the prednisone. Believe prednisone was started by her dermatologist. She is on 2.5-4L of oxygen at home. The difficulty breathing started when she missed her HD session on Saturday. Denies fever, or worsening cough. She has been sneezing- reports she has allergies "real bad".   Abdominal wound She has been on keflex, ordered by her Dermatologist. She had a biopsy of her skin lesions- was told that "everything looked good" and was advised to finish course of medications.    A-fib They also noticed that she was in atrial fibrillation last week but she has since come out of it. Denies chest pain or sob. States she is taking eliquis.   ED Course In the ED, vitals notable for hypotension to 90s/40s. Labs significant for LA 3.4, Hgb 8.2, Cr 4.57, AST 65, ALT 217, AG 16, Trop 30. FOBT positive. CXR with new small b/l pleural effusions, new left basilar atelectasis/airspace disease. She was given a dose of cefepime and a 500 cc bolus of NaCl.   Review Of Systems: Per HPI   Pertinent Past Medical History:  Lynch Syndrome  Renal: ESRD on  HD, prior renal cell cancer Heme: Anemia of chronic renal disease GI: chronic diffuse gastritis, duodenal angiectasia, colon cancer s/p colectomy, GERD, intestinal AVMs  Other: PAF, CAD, CHF, Anxiety, Hypothyroidism, T2DM, hemorrhoids  Remainder reviewed in history tab.   Pertinent Past Surgical History:  06/23/2021 - Prior Admission - EGD revealing bleeding duodenal angiectasia (fulgurated with APC)  05/08/2020 - Right hemicolectomy for descending and cecal Colon cancer   Remainder reviewed in history tab.  Pertinent Social History: Tobacco use:  No Alcohol use: Never Other Substance use: No  Lives with son and daughter  Pertinent Family History: Father - Colon and Kidney Cancer  Remainder reviewed in history tab.   Important Outpatient Medications: Albuterol - rarely uses. Amiodarone  Eliquis 2.5 mg BID  Klonopin 0.5-1 mg nightly for sleep.  Midodrine 10 mg TID  Pepcid 10 mg  Reglan 5 mg QID (taken two doses today thus far)  Phenergan PRN- last dose last Thursday  Renal vitamin  Synthroid 175 mcg   Remainder reviewed in medication history.   Objective: BP (!) 94/53 (BP Location: Left Arm)   Pulse 63   Temp (!) 97.4 F (36.3 C) (Oral)   Resp 17   SpO2 95%  Exam: General: NAD, resting comfortably in hospital bed HEENT: No sign of trauma, EOM grossly intact Cardiac: Irregularly irregular rhythm, no m/r/g Respiratory: CTAB, normal WOB, no w/c/r GI: Soft, NTTP, non-distended, no rebound or guarding, colostomy bag with [place] stool chart type V Extremities: NTTP, 1+ pitting edema up to mid shin bilaterally in lower extremities Neuro: A&O x4, memory: Intact . PEERLA. Sensation normal upper and lower ext's bilaterally. Strength 5/5 in upper and lower ext's bilaterally. Psych: Appropriate mood and affect   Labs:  CBC BMET  Recent Labs  Lab 08/05/21 1641 08/05/21 2025  WBC 13.0*  --   HGB 8.1* 8.2*  HCT 28.8* 24.0*  PLT 269  --    Recent Labs  Lab 08/05/21 1641 08/05/21 2025  NA 135 135  K 4.4 4.2  CL 95* 99  CO2 24  --   BUN 28* 35*  CREATININE 4.57* 5.40*  GLUCOSE 158* 129*  CALCIUM 8.0*  --     Pertinent additional labs:  -AST 65, troponin 30, LA 3.4 downtrend to 2.4, FOBT - Positive  -Blood cultures collected  EKG: My own interpretation (not copied from electronic read): Normal axis, irregular rhythm, no ST elevation, consistent with atrial fibrillation   Imaging Studies Performed:  Imaging Study (ie. Chest x-ray) Impression from Radiologist:   CXR 08/05/21 IMPRESSION: 1. New small  bilateral pleural effusions. 2. New left basilar atelectasis/airspace disease. 3. Stable cardiomegaly.   My Interpretation:   CXR 08/05/21 - bilateral small pleural effusions - cannot rule out pneumonia   Salvadore Oxford, MD 08/05/2021, 8:12 PM PGY-1, [facility] Intern pager: [phone], text pages welcome Secure chat group [place] Upper-Level Resident Addendum   I have independently interviewed and examined the patient. I have discussed the above with the original author and agree with their documentation. My edits for correction/addition/clarification are included where appropriate. Please see also any attending notes.   Sharion Settler, DO PGY-3, [facility] 08/06/2021 1:00 AM  FPTS Service pager: [phone] (text pages welcome through [facility])

## 2021-08-05 NOTE — Assessment & Plan Note (Addendum)
-   Downtrending, negative viral testing, repeat as outpatient

## 2021-08-05 NOTE — Assessment & Plan Note (Deleted)
10lbs weight loss

## 2021-08-05 NOTE — ED Notes (Signed)
Provider unable to obtain IV access, IV team already notified as well as phlebotomy

## 2021-08-05 NOTE — ED Provider Notes (Signed)
[facility] Provider Note   CSN: [id number] Arrival date & time: 08/05/21  1612     History  Chief Complaint  Patient presents with   Fall   Fatigue    [name] is a 81 y.o. female hx of ESRD on HD (last HD is today), recent admission for sepsis from abdominal cellulitis, here presenting with fall and fatigue.  Patient has not been doing well for the last week or so.  She has not been eating or drinking much.  She has been weak all week and has not been walking.  She did miss dialysis session on Saturday.  She did go to dialysis and finished dialysis and was too weak to even get in the car.  Over the last week or so, she has been required 2 people assist just to get her transferred from bed to the commode.  Patient was recently admitted for cellulitis and is still on cephalosporin  The history is provided by the patient.       Home Medications Prior to Admission medications   Medication Sig Start Date End Date Taking? Authorizing Provider  acetaminophen (TYLENOL) 500 MG tablet Take 0.5 tablets (250 mg total) by mouth every 6 (six) hours as needed for mild pain (pain). Patient taking differently: Take 1,000 mg by mouth daily as needed for mild pain (pain). 11/22/16   Aline August, MD  albuterol (VENTOLIN HFA) 108 (90 Base) MCG/ACT inhaler Inhale 1-2 puffs into the lungs every 6 (six) hours as needed for wheezing or shortness of breath.    [provider]  amiodarone (PACERONE) 200 MG tablet TAKE 1 TABLET EVERY DAY Patient taking differently: Take 200 mg by mouth daily. 03/26/21   [name], Peter M, MD  apixaban (ELIQUIS) 2.5 MG TABS tablet TAKE ONE (1) TABLET BY MOUTH TWO (2) TIMES DAILY 07/02/21   Dickie La, MD  cetirizine (ZYRTEC) 10 MG tablet Take 10 mg by mouth daily.     [provider]  clonazePAM (KLONOPIN) 1 MG tablet Take 0.5-1 mg by mouth See admin instructions. Take 1 mg by mouth at bedtime on Sun/Mon/Wed/Fri  and 0.5 mg at bedtime on Tues/Thurs/Sat- may take an additional 0.5-1 mg up to two (more) times a day as needed for anxiety (total combined daily dose is a max of 3 milligrams)    [provider]  famotidine (PEPCID) 10 MG tablet Take 1 tablet (10 mg total) by mouth 2 (two) times daily. 07/01/21   Alcus Dad, MD  fluticasone (FLONASE) 50 MCG/ACT nasal spray Place 1 spray into both nostrils daily as needed for allergies or rhinitis. 10/25/19   [provider]  leptospermum manuka honey (MEDIHONEY) PSTE paste Apply 1 application. topically daily. 07/01/21   Alcus Dad, MD  levalbuterol Penne Lash) 1.25 MG/3ML nebulizer solution Take 1.25 mg by nebulization 2 (two) times daily as needed for wheezing or shortness of breath. 02/17/21   [provider]  metoCLOPramide (REGLAN) 5 MG tablet Take 1 tablet (5 mg total) by mouth 4 (four) times daily -  before meals and at bedtime. 07/16/21   [name], MD  multivitamin (RENA-VIT) TABS tablet Take 1 tablet by mouth at bedtime.    [provider]  OXYGEN Inhale 3 L into the lungs as needed (shortness of breath).    [provider]  promethazine (PHENERGAN) 12.5 MG tablet Take 12.5 mg by mouth every 6 (six) hours as needed for nausea or vomiting.  [provider]  sevelamer carbonate (RENVELA) 800 MG tablet Take 1 tablet (800 mg total) by mouth 3 (three) times daily with meals. 07/01/21   Alcus Dad, MD  SYNTHROID 175 MCG tablet Take 175 mcg by mouth daily before breakfast. 08/14/19   [provider]  vitamin B-12 (CYANOCOBALAMIN) 500 MCG tablet Take 500 mcg by mouth daily.    [provider]      Allergies    Adhesive [tape], Avelox [moxifloxacin], Banana, Blueberry flavor, Cantaloupe extract allergy skin test, Cefprozil, Cetacaine [butamben-tetracaine-benzocaine], Dicyclomine, Food, Imdur [isosorbide nitrate], Januvia [sitagliptin], Lipitor [atorvastatin], Losartan potassium,  Nitroglycerin, Omeprazole, Oxycodone, Penicillins, Prednisone, Vancomycin, Watermelon [citrullus vulgaris], Hydrocodone, Latex, Tamiflu [oseltamivir], Feraheme [ferumoxytol], Other, Lasix [furosemide], and Mupirocin    Review of Systems   Review of Systems  Neurological:  Positive for dizziness.  All other systems reviewed and are negative.   Physical Exam Updated Vital Signs BP (!) 93/37   Pulse 66   Temp (!) 97.4 F (36.3 C) (Oral)   Resp 18   SpO2 100%  Physical Exam Vitals and nursing note reviewed.  Constitutional:      Comments: Chronically ill  HENT:     Head: Normocephalic.     Nose: Nose normal.     Mouth/Throat:     Mouth: Mucous membranes are dry.  Eyes:     Extraocular Movements: Extraocular movements intact.     Pupils: Pupils are equal, round, and reactive to light.  Cardiovascular:     Rate and Rhythm: Normal rate and regular rhythm.     Pulses: Normal pulses.     Heart sounds: Normal heart sounds.  Pulmonary:     Comments: Crackles bilateral bases Abdominal:     General: Abdomen is flat.     Palpations: Abdomen is soft.     Comments: Patient has a colostomy with dark stool  Musculoskeletal:        General: Normal range of motion.     Cervical back: Normal range of motion and neck supple.     Comments: Stage II sacral decub ulcer with no obvious infection  Skin:    General: Skin is warm.     Capillary Refill: Capillary refill takes less than 2 seconds.  Neurological:     Comments: Patient is 3 out of 5 strength bilateral arms and legs.  No facial droop  Psychiatric:        Mood and Affect: Mood normal.        Behavior: Behavior normal.     ED Results / Procedures / Treatments   Labs (all labs ordered are listed, but only abnormal results are displayed) Labs Reviewed  CBC WITH DIFFERENTIAL/PLATELET - Abnormal; Notable for the following components:      Result Value   WBC 13.0 (*)    RBC 3.44 (*)    Hemoglobin 8.1 (*)    HCT 28.8 (*)    MCH  23.5 (*)    MCHC 28.1 (*)    RDW 18.7 (*)    Neutro Abs 10.8 (*)    Monocytes Absolute 1.2 (*)    Abs Immature Granulocytes 0.10 (*)    All other components within normal limits  COMPREHENSIVE METABOLIC PANEL - Abnormal; Notable for the following components:   Chloride 95 (*)    Glucose, Bld 158 (*)    BUN 28 (*)    Creatinine, Ser 4.57 (*)    Calcium 8.0 (*)    Total Protein 6.0 (*)    Albumin 2.5 (*)  AST 65 (*)    Alkaline Phosphatase 217 (*)    GFR, Estimated 9 (*)    Anion gap 16 (*)    All other components within normal limits  LACTIC ACID, PLASMA - Abnormal; Notable for the following components:   Lactic Acid, Venous 3.4 (*)    All other components within normal limits  POC OCCULT BLOOD, ED - Abnormal; Notable for the following components:   Fecal Occult Bld POSITIVE (*)    All other components within normal limits  TROPONIN I (HIGH SENSITIVITY) - Abnormal; Notable for the following components:   Troponin I (High Sensitivity) 30 (*)    All other components within normal limits  CULTURE, BLOOD (ROUTINE X 2)  CULTURE, BLOOD (ROUTINE X 2)  SARS CORONAVIRUS 2 BY RT PCR  LACTIC ACID, PLASMA  I-STAT CHEM 8, ED  TYPE AND SCREEN  TROPONIN I (HIGH SENSITIVITY)    EKG None  Radiology DG Chest Port 1 View  Result Date: 08/05/2021 CLINICAL DATA:  Syncope. EXAM: PORTABLE CHEST 1 VIEW COMPARISON:  Chest x-ray 05/22/2021 FINDINGS: Left-sided central venous catheter tip projects over the cavoatrial junction, unchanged. The heart is enlarged, unchanged. There are new small bilateral pleural effusions, left greater than right. There is new patchy opacity in the left lung base. There is no pneumothorax or acute fracture. IMPRESSION: 1. New small bilateral pleural effusions. 2. New left basilar atelectasis/airspace disease. 3. Stable cardiomegaly. Electronically Signed   By: Ronney Asters M.D.   On: 08/05/2021 17:02    Procedures Procedures    CRITICAL CARE Performed by: Wandra Arthurs   Total critical care time: 30 minutes  Critical care time was exclusive of separately billable procedures and treating other patients.  Critical care was necessary to treat or prevent imminent or life-threatening deterioration.  Critical care was time spent personally by me on the following activities: development of treatment plan with patient and/or surrogate as well as nursing, discussions with consultants, evaluation of patient's response to treatment, examination of patient, obtaining history from patient or surrogate, ordering and performing treatments and interventions, ordering and review of laboratory studies, ordering and review of radiographic studies, pulse oximetry and re-evaluation of patient's condition.   Medications Ordered in ED Medications  sodium chloride 0.9 % bolus 500 mL (has no administration in time range)  ondansetron (ZOFRAN) injection 4 mg (has no administration in time range)  ceFEPIme (MAXIPIME) 1 g in sodium chloride 0.9 % 100 mL IVPB (has no administration in time range)    ED Course/ Medical Decision Making/ A&P                           Medical Decision Making [name] [name] is a 81 y.o. female who presented with weakness.  Patient is hypotensive.  Patient just finished dialysis.  I think the hypotension could be from overdiuresis versus GI bleed versus sepsis.  Plan to get CBC and CMP and lactate and cultures and chest x-ray.  Patient does not urinate.  7:06 PM Patient is a difficult IV stick and IV nurse was able to get an IV.  Patient's white blood cell count is 13.  Lactate is elevated at 3.4.  Chest x-ray showed new left pneumonia versus atelectasis.  Patient is allergic to vancomycin.  I ordered cefepime for broad-spectrum coverage.  Family practice to admit for sepsis from pneumonia.  Note, patient's hemoglobin is 8.1 and she is guaiac positive with dark stool.  She is on Eliquis.  I messaged Dr. Henrene Pastor from [facility] to see patient as a  consult.   Problems Addressed: HCAP (healthcare-associated pneumonia): acute illness or injury Sepsis, due to unspecified organism, unspecified whether acute organ dysfunction present [facility]): acute illness or injury  Amount and/or Complexity of Data Reviewed Labs: ordered. Decision-making details documented in ED Course. Radiology: ordered and independent interpretation performed. Decision-making details documented in ED Course. ECG/medicine tests: ordered and independent interpretation performed. Decision-making details documented in ED Course.  Risk Prescription drug management. Decision regarding hospitalization.    Final Clinical Impression(s) / ED Diagnoses Final diagnoses:  None    Rx / DC Orders ED Discharge Orders     None         Drenda Freeze, MD 08/05/21 [name]

## 2021-08-05 NOTE — ED Triage Notes (Signed)
Patient reports that she has been throwing up since Thursday. Patient reports that she cannot walk starting a month ago. Patient reports that she ALMOST fell out of the car due to weakness. EMS reports a HR of 120s

## 2021-08-05 NOTE — ED Notes (Signed)
IV team at bedside attempting to get USGPIV access and reports having difficulty

## 2021-08-05 NOTE — Assessment & Plan Note (Addendum)
Patient asymptomatic.  On exam and on telemetry patient in A-fib. - Currently holding home Eliquis in the setting of GI bleed, patient prefers to restart though there is risk of increased bleeding, requiring transfusions. - Continue home amiodarone

## 2021-08-05 NOTE — Progress Notes (Signed)
Family medicine teaching service will be admitting this patient. Our pager information can be located in the physician sticky notes, treatment team sticky notes, and the headers of all our official daily progress notes.   FAMILY MEDICINE TEACHING SERVICE Patient - Please contact intern pager [phone] or text page via website [email or web address] (login: [email or web address]) for questions regarding care. DO NOT page listed attending provider unless there is no answer from the number above.   Allison Rhue, MD PGY-3, [facility] pager [phone]   

## 2021-08-05 NOTE — Assessment & Plan Note (Addendum)
On LLQ. 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 Was started on Prednisone burst, scheduled to finish 7/14 (but has missed two doses thus far). - Wound care consult, appreciate recommendations - Steroid cream ordered

## 2021-08-05 NOTE — Assessment & Plan Note (Addendum)
Slightly improved, due to anemia, ESRD with volume overload on admission, gastroparesis and multiple comorbid conditions  -PT/OT -Fall precautions -Delirium precautions -Tylenol as needed for pain

## 2021-08-05 NOTE — Assessment & Plan Note (Addendum)
Present on admission, has history of chronic hypotension. Evaluation for infection and adrenal insufficiency negative - Monitor, MAP >60 - Continue midodrine 10 mg TID

## 2021-08-05 NOTE — Assessment & Plan Note (Addendum)
TTH[facility] dialysis schedule. Patient presented today after missed dialysis session on 08/02/21 -Consult Nephrology for dialysis, appreciate recs  -RFP on dialysis days -Strict I/O -Daily weights -Continue home Renvela

## 2021-08-05 NOTE — ED Notes (Signed)
Provider notified of loss of IV access at this time

## 2021-08-05 NOTE — Telephone Encounter (Signed)
Spoke with pt's daughter on the phone about hemoglobin dropping to 7.7, was 9.4 a month ago. Advised that she can go to dialysis this morning and have her hemoglobin checked there. If it is continuing to drop, I advised daughter to bring pt to the ED. Daughter expressed understanding and agreed with the plan.

## 2021-08-06 ENCOUNTER — Encounter (HOSPITAL_COMMUNITY): Payer: Self-pay | Admitting: Family Medicine

## 2021-08-06 DIAGNOSIS — N186 End stage renal disease: Secondary | ICD-10-CM | POA: Diagnosis not present

## 2021-08-06 DIAGNOSIS — L98499 Non-pressure chronic ulcer of skin of other sites with unspecified severity: Secondary | ICD-10-CM

## 2021-08-06 DIAGNOSIS — G4700 Insomnia, unspecified: Secondary | ICD-10-CM | POA: Diagnosis present

## 2021-08-06 DIAGNOSIS — R531 Weakness: Secondary | ICD-10-CM | POA: Diagnosis not present

## 2021-08-06 DIAGNOSIS — I480 Paroxysmal atrial fibrillation: Secondary | ICD-10-CM | POA: Diagnosis not present

## 2021-08-06 DIAGNOSIS — I132 Hypertensive heart and chronic kidney disease with heart failure and with stage 5 chronic kidney disease, or end stage renal disease: Secondary | ICD-10-CM | POA: Diagnosis not present

## 2021-08-06 DIAGNOSIS — D649 Anemia, unspecified: Secondary | ICD-10-CM | POA: Diagnosis not present

## 2021-08-06 LAB — RENAL FUNCTION PANEL
Albumin: 2.2 g/dL — ABNORMAL LOW (ref 3.5–5.0)
Anion gap: 12 (ref 5–15)
BUN: 35 mg/dL — ABNORMAL HIGH (ref 8–23)
CO2: 26 mmol/L (ref 22–32)
Calcium: 8.2 mg/dL — ABNORMAL LOW (ref 8.9–10.3)
Chloride: 98 mmol/L (ref 98–111)
Creatinine, Ser: 5.65 mg/dL — ABNORMAL HIGH (ref 0.44–1.00)
GFR, Estimated: 7 mL/min — ABNORMAL LOW (ref >60)
Glucose, Bld: 132 mg/dL — ABNORMAL HIGH (ref 70–99)
Phosphorus: 5.3 mg/dL — ABNORMAL HIGH (ref 2.5–4.6)
Potassium: 4.1 mmol/L (ref 3.5–5.1)
Sodium: 136 mmol/L (ref 135–145)

## 2021-08-06 LAB — HEMOGLOBIN AND HEMATOCRIT, BLOOD
HCT: 28.5 % — ABNORMAL LOW (ref 36.0–46.0)
Hemoglobin: 9 g/dL — ABNORMAL LOW (ref 12.0–15.0)

## 2021-08-06 LAB — HEPATITIS PANEL, ACUTE
HCV Ab: NONREACTIVE
Hep A IgM: NONREACTIVE
Hep B C IgM: NONREACTIVE
Hepatitis B Surface Ag: NONREACTIVE

## 2021-08-06 LAB — CBC
HCT: 25.2 % — ABNORMAL LOW (ref 36.0–46.0)
Hemoglobin: 7 g/dL — ABNORMAL LOW (ref 12.0–15.0)
MCH: 23.6 pg — ABNORMAL LOW (ref 26.0–34.0)
MCHC: 27.8 g/dL — ABNORMAL LOW (ref 30.0–36.0)
MCV: 84.8 fL (ref 80.0–100.0)
Platelets: 217 10*3/uL (ref 150–400)
RBC: 2.97 MIL/uL — ABNORMAL LOW (ref 3.87–5.11)
RDW: 18.7 % — ABNORMAL HIGH (ref 11.5–15.5)
WBC: 10.4 10*3/uL (ref 4.0–10.5)
nRBC: 0.3 % — ABNORMAL HIGH (ref 0.0–0.2)

## 2021-08-06 LAB — IRON AND TIBC
Iron: 10 ug/dL — ABNORMAL LOW (ref 28–170)
Saturation Ratios: 3 % — ABNORMAL LOW (ref 10.4–31.8)
TIBC: 300 ug/dL (ref 250–450)
UIBC: 290 ug/dL

## 2021-08-06 LAB — PREPARE RBC (CROSSMATCH)

## 2021-08-06 LAB — CORTISOL-AM, BLOOD: Cortisol - AM: 6.5 ug/dL — ABNORMAL LOW (ref 6.7–22.6)

## 2021-08-06 LAB — HEPATITIS B SURFACE ANTIBODY,QUALITATIVE: Hep B S Ab: NONREACTIVE

## 2021-08-06 MED ORDER — CHLORHEXIDINE GLUCONATE CLOTH 2 % EX PADS
6.0000 | MEDICATED_PAD | Freq: Every day | CUTANEOUS | Status: DC
  Administered 2021-08-07 – 2021-08-12 (×4): 6 via TOPICAL

## 2021-08-06 MED ORDER — DARBEPOETIN ALFA 200 MCG/0.4ML IJ SOSY: 200.0000 ug | PREFILLED_SYRINGE | INTRAMUSCULAR | Status: DC

## 2021-08-06 MED ORDER — COSYNTROPIN 0.25 MG IJ SOLR
0.2500 mg | Freq: Once | INTRAMUSCULAR | Status: AC
  Administered 2021-08-07: 0.25 mg via INTRAVENOUS
  Filled 2021-08-06: qty 0.25

## 2021-08-06 MED ORDER — SODIUM CHLORIDE 0.9% IV SOLUTION: Freq: Once | INTRAVENOUS | Status: DC

## 2021-08-06 MED ORDER — AMIODARONE HCL 200 MG PO TABS
200.0000 mg | ORAL_TABLET | Freq: Every day | ORAL | Status: DC
  Administered 2021-08-06 – 2021-08-11 (×6): 200 mg via ORAL
  Filled 2021-08-06 (×7): qty 1

## 2021-08-06 MED ORDER — CALCITRIOL 0.25 MCG PO CAPS
0.2500 ug | ORAL_CAPSULE | ORAL | Status: DC
  Administered 2021-08-07 – 2021-08-12 (×3): 0.25 ug via ORAL
  Filled 2021-08-06 (×4): qty 1

## 2021-08-06 NOTE — Consult Note (Addendum)
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   Watermelon [citrullus vulgaris] Anaphylaxis and Other (See Comments) 03/19/2021   Hydrocodone Hives and Other (See Comments) 03/04/2012   Latex Rash and Other (See Comments) 02/20/2015   Tamiflu [oseltamivir] Other (See Comments) 07/27/2014   Feraheme [ferumoxytol] Other (See Comments) 08/28/2018   Other Other (See Comments) 02/15/2021   Lasix [furosemide] Hives, Swelling, and Rash 07/04/2014   Mupirocin Rash 08/05/2015    Family History  Problem Relation Age of Onset   Heart attack Mother    Diabetes Mother    Colon cancer Father    Esophageal cancer Father    Kidney cancer Father     Diabetes Father    Ovarian cancer Sister    Liver cancer Sister    Breast cancer Sister    Colon cancer Son    Colon polyps Son    Diabetes Sister    Irritable bowel syndrome Sister    Myocarditis Brother    Rectal cancer Neg Hx    Stomach cancer Neg Hx     Social History   Socioeconomic History   Marital status: Divorced    Spouse name: Not on file   Number of children: 2   Years of education: Not on file   Highest education level: Not on file  Occupational History   Occupation: Retired    Fish farm manager: RETIRED  Tobacco Use   Smoking status: Never   Smokeless tobacco: Never  Vaping Use   Vaping Use: Never used  Substance and Sexual Activity   Alcohol use: No   Drug use: No   Sexual activity: Not Currently  Other Topics Concern   Not on file  Social History Narrative   ** Merged History Encounter **       Divorced   3 children, 1 deceased   Social Determinants of Health   Financial Resource Strain: Not on file  Food Insecurity: No Food Insecurity (06/24/2021)   Hunger Vital Sign    Worried About Running Out of Food in the Last Year: Never true    Ran Out of Food in the Last Year: Never true  Transportation Needs: No Transportation Needs (06/24/2021)   PRAPARE - Hydrologist (Medical): No    Lack of Transportation (Non-Medical): No  Physical Activity: Not on file  Stress: Not on file  Social Connections: Not on file  Intimate Partner Violence: Not on file    REVIEW OF SYSTEMS: Constitutional: Generalized weakness ENT:  No nose bleeds Pulm: No dyspnea CV:  No palpitations, no LE edema.  No chest GU:  No hematuria, no frequency GI: See HPI Heme: Other than the bleeding into the ostomy from the stoma, no other unusual bleeding or bruising Transfusions: See HPI Neuro:  No headaches, no peripheral tingling or numbness.  Lightheadedness.  No syncope, no seizures Derm:  No itching, no rash or sores.  Endocrine:  No sweats or  chills.  No polyuria or dysuria Immunization: Reviewed Travel:  Not worried.   PHYSICAL EXAM: Vital signs in last 24 hours: Vitals:   08/06/21 0530 08/06/21 0800  BP: 100/75 112/63  Pulse: 67 69  Resp: 18 17  Temp:    SpO2: 94% 100%   Wt Readings from Last  3 Encounters:  07/01/21 98 kg  06/18/21 88.6 kg  06/09/21 90.6 kg    General: Patient slept during my exam.  She looks chronically ill, morbidly obese.  Pale.  Appears to be in no distress even with the exam Head:  No facial asymmetry or swelling.  No signs of head trauma  Eyes: Conjunctiva slightly pale Ears: Unable to assess hearing in this sleeping patient. Nose: No discharge, no dried blood Mouth: Oral mucosa somewhat dry but clear. Neck: No mass or JVD Lungs: No labored breathing or cough.  Lungs clear anteriorly. Heart: RRR.  No MRG.  S1, S2 present Abdomen: Obese, soft.  Abundant soft light to medium brown stool in the ostomy bag.  No blood visible.  6 cm ulcerative wound at LLQ.  No blood evident.  No reaction to abdominal palpation.  Active bowel sounds..   Rectal: Deferred Musc/Skeltl: No joint redness, swelling or gross deformities Extremities: Lower extremity pitting edema into the thighs. Neurologic: Unresponsive to exam.  Did not make vigorous efforts to wake her up. Skin: No telangiectasia, no significant bruising or obvious ulcers on incomplete survey   Intake/Output from previous day: 07/11 0701 - 07/12 0700 In: 93.9 [IV Piggyback:93.9] Out: -  Intake/Output this shift: No intake/output data recorded.  LAB RESULTS: Recent Labs    08/04/21 1517 08/05/21 1641 08/05/21 2025 08/06/21 0538  WBC 12.2* 13.0*  --  10.4  HGB 7.7* 8.1* 8.2* 7.0*  HCT 26.5* 28.8* 24.0* 25.2*  PLT 335 269  --  217   BMET Lab Results  Component Value Date   NA 136 08/06/2021   NA 135 08/05/2021   NA 135 08/05/2021   K 4.1 08/06/2021   K 4.2 08/05/2021   K 4.4 08/05/2021   CL 98 08/06/2021   CL 99 08/05/2021    CL 95 (L) 08/05/2021   CO2 26 08/06/2021   CO2 24 08/05/2021   CO2 22 08/04/2021   GLUCOSE 132 (H) 08/06/2021   GLUCOSE 129 (H) 08/05/2021   GLUCOSE 158 (H) 08/05/2021   BUN 35 (H) 08/06/2021   BUN 35 (H) 08/05/2021   BUN 28 (H) 08/05/2021   CREATININE 5.65 (H) 08/06/2021   CREATININE 5.40 (H) 08/05/2021   CREATININE 4.57 (H) 08/05/2021   CALCIUM 8.2 (L) 08/06/2021   CALCIUM 8.0 (L) 08/05/2021   CALCIUM 9.1 08/04/2021   LFT Recent Labs    08/05/21 1641 08/06/21 0538  PROT 6.0*  --   ALBUMIN 2.5* 2.2*  AST 65*  --   ALT 33  --   ALKPHOS 217*  --   BILITOT 0.8  --    PT/INR Lab Results  Component Value Date   INR 1.5 (H) 06/24/2021   INR 2.1 (H) 03/19/2021   INR 1.8 (H) 07/16/2020   Hepatitis Panel Recent Labs    08/06/21 0538  HEPBSAG NON REACTIVE  HCVAB NON REACTIVE  HEPAIGM NON REACTIVE  HEPBIGM NON REACTIVE   C-Diff No components found for: "CDIFF" Lipase     Component Value Date/Time   LIPASE 49 05/22/2021 1334    Drugs of Abuse  No results found for: "LABOPIA", "COCAINSCRNUR", "LABBENZ", "AMPHETMU", "THCU", "LABBARB"   RADIOLOGY STUDIES: DG Chest Port 1 View  Result Date: 08/05/2021 CLINICAL DATA:  Syncope. EXAM: PORTABLE CHEST 1 VIEW COMPARISON:  Chest x-ray 05/22/2021 FINDINGS: Left-sided central venous catheter tip projects over the cavoatrial junction, unchanged. The heart is enlarged, unchanged. There are new small bilateral pleural effusions, left greater than right. There is  new patchy opacity in the left lung base. There is no pneumothorax or acute fracture. IMPRESSION: 1. New small bilateral pleural effusions. 2. New left basilar atelectasis/airspace disease. 3. Stable cardiomegaly. Electronically Signed   By: Ronney Asters M.D.   On: 08/05/2021 17:02      IMPRESSION:   Acute on Chronic anemia.  1 PRBC ordered.    Resolved nausea, vomiting.  FOBT +.  Chronic peristomal bleeding of mild volume.  Patient is FOBT positive.  Do not see any  blood in ostomy bag currently.  Recurrent anemia with EGD, ileoscopy 6 weeks ago.  Had cauterization of duodenal AVM, diffuse gastritis above.  1 nonadenomatous, nondysplastic polyp removed at ileoscopy.    Chronic Eliquis.  Last dose 7/11 in AM.    Lynch Syndrome.  History of colon cancer.  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  ESRD.  On hemodialysis.  Missed dialysis on Saturday.    PLAN:       Regular renal diet.  Dr Bryan Lemma to see pt and decide re repeating scope studies.      Is it time to re-assess the risk/benefit of eliquis?    Azucena Freed  08/06/2021, 10:05 AM Phone [phone]

## 2021-08-06 NOTE — Progress Notes (Cosign Needed Addendum)
Daily Progress Note Intern Pager: [phone]  Patient name: [name] Medical record number: [id number] Date of birth: 1941-01-03 Age: 81 y.o. Gender: female  Primary Care Provider: [name], MD Consultants: Neurology, wound care Code Status: DNR  Pt Overview and Major Events to Date:  7/11-admitted, held Eliquis  Assessment and Plan: [name] is a 81 year old female with pertinent medical history of Lynch syndrome, colon cancer s/p right hemicolectomy, small bowel AVM, and renal cancer, now ESRD on dialysis, who presented with weakness increased from baseline most likely secondary to deconditioning and acute on chronic anemia, exacerbated by fluid imbalance from his dialysis session.  * Weakness Patient presented with acute change in strength and ambulation. On exam, no focal deficits, strength 5/5 in extremities, unable to test ambulation. Likely due to fluid imbalance from missed dialysis session versus acute on chronic anemia. -Admit to [facility] med-tele, attending Dr. Gwendlyn Deutscher -PT/OT -Fall precautions -Delirium precautions -Tylenol as needed for pain  Acute on chronic anemia Patient presented with increased fatigue and weakness. Hg 8.1 on admission, 7.7 on 08/04/2021, baseline around 9.3.  MCV normocytic at 83.7.  Fecal occult blood test positive.  Concern for increased GI bleed versus worsening renal anemia. On Eliquis for A-fib. Transfusion threshold <8. -GI consulted in the ED recs appreciated -Hgb 7.0, s/p 1 unit H&H posttransfusion -Hold Eliquis, discuss risks/benefits   ESRD (end stage renal disease) on dialysis ([place]) [facility] dialysis schedule. Patient presented today after missed dialysis session on 08/02/21, but attended session today (08/05/21) but unable to remove as much fluid as usual. Exam indicative of volume overload. Electrolytes stable. Some acidosis, LA 3.4, AG 16. Creatine 4.57 (BL ~5).  -Consult Nephrology for dialysis, appreciate recs -RFP  daily -Renal diet -Strict I/O -Daily weights -Continue home Renvela  Elevated liver transaminase level AST elevated to 65. Alk Phos 217, total bili normal. Patient has no complaint of stomach pain.  No signs of jaundice on exam.  Denies alcohol use.  Hepatitis B surface antigen and hepatitis B S antibody nonreactive on 06/24/2021.  Patient on amiodarone at home which may be contributing.  -F/u CMP 7/13 AM   A-fib [facility]) Patient asymptomatic.  On exam and on telemetry patient in A-fib. - Hold home Eliquis in setting of GI bleed - Continue home amiodarone - Consider metoprolol if rate increases and causes significant symptoms.   Hypotension As BP on admission 90s-110s, patient on home midodrine.  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  Less likely sepsis given repeat lactic acid decreased from 3.4-2.4.   -Continue home midodrine- 10 mg TID  -D/C cefepime given stable -Monitor vitals, try to maintain MAP >60 -Follow-up blood cultures -AM cortisol 6.5, ACTH stim test tomorrow morning.    Skin ulcer of abdominal wall (HCC) On LLQ. 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 Was started on Prednisone burst, scheduled to finish 7/14 (but has missed two doses thus far). - Wound care consult, appreciate recommendations -Hold Prednisone in the setting of hypotension on admission and low cortisol   Hypothyroidism Last last TSH from 02/16/2021 0.756. - Continue home levothyroxine  Insomnia and Anxiety  Patient takes 0.5 to 1 mg Klonopin nightly for sleep. - Continue Klonopin given low dose and palliative measure  Diabetic gastroparesis ([place]) - Continue home Reglan - Continue home Pepcid   FEN/GI: Renal with fluid restriction PPx:  Holding Eliquis secondary to GI bleed Dispo: Pending clinical  improvement  Subjective:  Patient says she has been feeling more lightheaded and weak recently. She reports minimal bleeding from her stoma site and no bleeding from her bottom. She says that her right leg has gotten more weak in the last few weeks, She used to be able to walk with a walker but can now barely move in the bed.   Objective: Temp:  [97.4 F (36.3 C)] 97.4 F (36.3 C) (07/11 1639) Pulse Rate:  [57-81] 69 (07/12 0800) Resp:  [13-19] 17 (07/12 0800) BP: (82-115)/(31-75) 112/63 (07/12 0800) SpO2:  [92 %-100 %] 100 % (07/12 0800) Physical Exam: General: Chronically sick appearing woman, conversant  Cardiovascular: Regularly irregular, increased cap refil 3 seconds  Respiratory: Diminished lung sounds middle to bas bilateral  Abdomen: Softe, non distended, non tender to palpation, 6cm ulcerated would in LLQ of abdomen Extremities: 2+ Pitting edema bilaterally in LLE up to the absomen                                         Laboratory: Most recent CBC Lab Results  Component Value Date   WBC 10.4 08/06/2021   HGB 7.0 (L) 08/06/2021   HCT 25.2 (L) 08/06/2021   MCV 84.8 08/06/2021   PLT 217 08/06/2021   Most recent BMP    Latest Ref Rng & Units 08/06/2021    5:38 AM  BMP  Glucose 70 - 99 mg/dL 132   BUN 8 - 23 mg/dL 35   Creatinine 0.44 - 1.00 mg/dL 5.65   Sodium 135 - 145 mmol/L 136   Potassium 3.5 - 5.1 mmol/L 4.1   Chloride 98 - 111 mmol/L 98   CO2 22 - 32 mmol/L 26   Calcium 8.9 - 10.3 mg/dL 8.2     Other pertinent labs lactic acid 3.4 > 2.4 , phosphorus 5.3, albumin 2.2  Imaging/Diagnostic Tests: CXR Radiologist Impression: New small bilateral pleural effusions, new left basilar atelectasis/airspace disease, stable cardiomegaly My interpretation: Left moderate pleural effusion with midlung airspace disease, right mild pleural effusion with superior airspace disease/atelectasis.  Lowry Ram, MD 08/06/2021, 10:38 AM  PGY-1, [facility] Intern pager: [phone], text pages welcome Secure chat group [place]

## 2021-08-06 NOTE — Consult Note (Signed)
[facility] Nurse ostomy consult note Stoma type/location: RUQ ileostomy Stomal assessment/size: Slightly smaller than 1 and 1/.8 inches round, red Peristomal assessment: Intact as reported by daughter who is patient's caregiver Treatment options for stomal/peristomal skin: None indicated. Output: brown, mushy stool  Ostomy pouching: 2pc., 2 and 1/4 inch  pouching system with a high-output pouch. Skin barrier is Kellie Simmering # [phone], High output pouch is Kellie Simmering # [phone] Education provided: None today Enrolled patient in [place] program: No . Patient is established with a provider of supplies.  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  The PG is being followed by Dermatologist Dr. Nevada Crane and the patient's daughter called Dr. Juel Burrow office in Dr. Erin Hearing' presence for the strength of the clobetasol cream that has been ordered for twice daily application to that wound. Wound type: Pressure, autoimmune Pressure Injury POA: Yes Measurement: Stage 2 pressure injury to sacrum: 0.5cm round x 0.1cm, pink, moist with scant serous drainage LLQ PG:  3.5cm x 7cm area of two circular wounds that are merging. Nonviable tissue in wound bed but according to daughter, less periwound erythema. No drainage. See photodocumentation provided by provider of this wound. Wound bed:As  described Drainage (amount, consistency, odor) As noted above Periwound: intact Dressing procedure/placement/frequency: Nursing is provided with guidance for the care of the sacral PI using a silicone bordered foam dressing for the sacrum and Family Medicine MD, Dr. Jerilynn Mages. [place], is providing guidance for the care of the PG in accordance with Dr. Juel Burrow POC using twice daily clobetasol cream.  [facility] nursing team will not follow, but will remain available to this patient, the nursing and medical teams.  Please re-consult if needed.  Thank you for inviting [place] to participate in this  patient's Plan of Care.  Maudie Flakes, MSN, RN, CNS, [place], Serita Grammes, [employer], [employer] phone:  [phone]

## 2021-08-06 NOTE — ED Notes (Signed)
This nurse took over for patient at 1305, read chart for report at 1415 and went into room to give medications, upon investigation and conversation with daughter and after calling blood bank, discovered that pt had not received blood from this AM. Pt also states she is allergic to Renvela causing NVD but has order for this medicine. Daughter also states the ostomy nurse stopped by because pt's ostomy is two days overdue for change and pt is "prone to infections around her ostomy" but ostomy nurse hasn't been back. Pt has ostomy bags in room but no wafers. Blood being picked up from blood bank. This nurse contacted attending and nephrology about medication allergy, new allergy to Renvela added and order D/C. This nurse also contacted ostomy nurse via secure chat, waiting for reply. Daughter appreciative for updates

## 2021-08-06 NOTE — Evaluation (Signed)
Occupational Therapy Evaluation Patient Details Name: [name] MRN: [id number] DOB: October 26, 1940 Today's Date: 08/06/2021   History of Present Illness 81 y.o. F admitted on 08/05/21 due to increased weakness. PMH significant for Afib on DOAC, recurrent GI bleed, DM2, Dementia, Obese, Gastroparesis, Combined CHF, ESRD on HD, Hx of GI cancer with hemicolectomy.   Clinical Impression   Pt admitted for concerns listed above. PTA pt/family reported that she was able to complete transfers and most ADL's with minimal assistance until this past week. At this time, she demonstrates increased weakness, balance deficits, decreased activity tolerance. She is requiring mod-max A +2 assist for all functional mobility and ADL's. Recommending SNF at this time, pending progression and family discussion about best way to provide care for the pt.    Recommendations for follow up therapy are one component of a multi-disciplinary discharge planning process, led by the attending physician.  Recommendations may be updated based on patient status, additional functional criteria and insurance authorization.   Follow Up Recommendations  Skilled nursing-short term rehab (<3 hours/day)    Assistance Recommended at Discharge Frequent or constant Supervision/Assistance  Patient can return home with the following Two people to help with walking and/or transfers;Two people to help with bathing/dressing/bathroom;Assistance with cooking/housework;Direct supervision/assist for medications management;Direct supervision/assist for financial management;Assist for transportation;Help with stairs or ramp for entrance    Functional Status Assessment  Patient has had a recent decline in their functional status and demonstrates the ability to make significant improvements in function in a reasonable and predictable amount of time.  Equipment Recommendations  None recommended by OT    Recommendations for Other Services        Precautions / Restrictions Precautions Precautions: Fall Restrictions Weight Bearing Restrictions: No      Mobility Bed Mobility Overal bed mobility: Needs Assistance Bed Mobility: Supine to Sit, Sit to Supine     Supine to sit: Mod assist, +2 for physical assistance, +2 for safety/equipment Sit to supine: Mod assist, +2 for physical assistance   General bed mobility comments: Mod A +2 for truncal management and BLE    Transfers Overall transfer level: Needs assistance Equipment used: 2 person hand held assist Transfers: Sit to/from Stand Sit to Stand: Mod assist, +2 physical assistance, +2 safety/equipment           General transfer comment: Mod A +2 to power up and steady, unable to maintain stand more than 15 seconds      Balance Overall balance assessment: Needs assistance Sitting-balance support: Bilateral upper extremity supported, Feet unsupported Sitting balance-Leahy Scale: Fair     Standing balance support: Bilateral upper extremity supported Standing balance-Leahy Scale: Poor Standing balance comment: Reuires hands on assist to maintain upright posture.                           ADL either performed or assessed with clinical judgement   ADL Overall ADL's : Needs assistance/impaired Eating/Feeding: Set up;Sitting   Grooming: Minimal assistance;Sitting   Upper Body Bathing: Minimal assistance;Sitting   Lower Body Bathing: Maximal assistance;+2 for physical assistance;+2 for safety/equipment;Sitting/lateral leans;Sit to/from stand   Upper Body Dressing : Minimal assistance;Sitting   Lower Body Dressing: Maximal assistance;+2 for physical assistance;+2 for safety/equipment;Sitting/lateral leans;Sit to/from stand   Toilet Transfer: Maximal assistance;+2 for physical assistance;+2 for safety/equipment;Stand-pivot   Toileting- Clothing Manipulation and Hygiene: Maximal assistance;+2 for safety/equipment;+2 for physical  assistance;Sitting/lateral lean;Sit to/from stand         General  ADL Comments: Pt isRequiring increased assist due to weakness and fatigue     Vision Baseline Vision/History: 1 Wears glasses Ability to See in Adequate Light: 0 Adequate Patient Visual Report: No change from baseline Vision Assessment?: No apparent visual deficits     Perception     Praxis      Pertinent Vitals/Pain Pain Assessment Pain Assessment: Faces Faces Pain Scale: Hurts little more     Hand Dominance Right   Extremity/Trunk Assessment Upper Extremity Assessment Upper Extremity Assessment: Generalized weakness   Lower Extremity Assessment Lower Extremity Assessment: Generalized weakness   Cervical / Trunk Assessment Cervical / Trunk Assessment: Kyphotic   Communication Communication Communication: HOH   Cognition Arousal/Alertness: Lethargic Behavior During Therapy: Flat affect                                         General Comments  VSS on 3.5L    Exercises     Shoulder Instructions      Home Living Family/patient expects to be discharged to:: Private residence Living Arrangements: Children Available Help at Discharge: Available 24 hours/day;Family Type of Home: House Home Access: Ramped entrance     Home Layout: One level     Bathroom Shower/Tub: Occupational psychologist: Handicapped height Bathroom Accessibility: No   Home Equipment: Rollator (4 wheels);Wheelchair - manual;BSC/3in1;Other (comment)   Additional Comments: 2-3L home O2      Prior Functioning/Environment Prior Level of Function : Needs assist             Mobility Comments: primarily transferring to/from w/c with family assist, can propel self short distances ADLs Comments: Family assisting with BSC transfers, all bathing/dressing tasks.        OT Problem List: Decreased strength;Decreased activity tolerance;Impaired balance (sitting and/or standing);Decreased safety  awareness;Decreased cognition;Impaired sensation;Obesity;Impaired UE functional use      OT Treatment/Interventions: Self-care/ADL training;Therapeutic exercise;Energy conservation;DME and/or AE instruction;Therapeutic activities;Patient/family education;Balance training    OT Goals(Current goals can be found in the care plan section) Acute Rehab OT Goals Patient Stated Goal: To get stronger OT Goal Formulation: With patient/family Time For Goal Achievement: 08/20/21 Potential to Achieve Goals: Good ADL Goals Pt Will Perform Grooming: with modified independence;sitting Pt Will Perform Lower Body Bathing: with mod assist;sitting/lateral leans;sit to/from stand Pt Will Perform Lower Body Dressing: with mod assist;sitting/lateral leans;sit to/from stand Pt Will Transfer to Toilet: with mod assist;stand pivot transfer Pt Will Perform Toileting - Clothing Manipulation and hygiene: with mod assist;sitting/lateral leans;sit to/from stand  OT Frequency: Min 2X/week    Co-evaluation PT/OT/SLP Co-Evaluation/Treatment: Yes Reason for Co-Treatment: For patient/therapist safety;To address functional/ADL transfers   OT goals addressed during session: Strengthening/ROM      AM-PAC OT "6 Clicks" Daily Activity     Outcome Measure Help from another person eating meals?: A Little Help from another person taking care of personal grooming?: A Little Help from another person toileting, which includes using toliet, bedpan, or urinal?: A Lot Help from another person bathing (including washing, rinsing, drying)?: A Lot Help from another person to put on and taking off regular upper body clothing?: A Little Help from another person to put on and taking off regular lower body clothing?: A Lot 6 Click Score: 15   End of Session Equipment Utilized During Treatment: Gait belt;Oxygen Nurse Communication: Mobility status  Activity Tolerance: Patient limited by lethargy Patient left: in bed;with call  bell/phone within reach;with family/visitor present  OT Visit Diagnosis: Other abnormalities of gait and mobility (R26.89);Unsteadiness on feet (R26.81);Muscle weakness (generalized) (M62.81)                Time: 4680-3212 OT Time Calculation (min): 36 min Charges:  OT General Charges $OT Visit: 1 Visit OT Evaluation $OT Eval Moderate Complexity: 1 Mod  Jmari Pelc H., OTR/L Acute Rehabilitation  Shahana Capes Elane Yolanda Bonine 08/06/2021, 12:43 PM

## 2021-08-06 NOTE — ED Notes (Signed)
Pt family member called out with several requests. FM stating mother felt slightly SOB, requesting O2. 2 LPM applied via [place] for comfort. FM also stating pt has small open sore on bottom. Mepilex applied and pt repositioned with pillows in stretcher. Family member also requesting ostomy care for pt and new supplies. Alondra, NS, placed order for new ostomy bag and supplies and wound/ostomy RN consult placed. Family member appreciative of care, and updated to POC and agreeable to plan. Primary RN [facility] updated

## 2021-08-06 NOTE — Evaluation (Signed)
Physical Therapy Evaluation Patient Details Name: [name] MRN: [id number] DOB: December 19, 1940 Today's Date: 08/06/2021  History of Present Illness  81 y.o. F admitted on 08/05/21 due to increased weakness. PMH significant for Afib on DOAC, recurrent GI bleed, DM2, Dementia, Obese, Gastroparesis, Combined CHF, ESRD on HD, Hx of GI cancer with hemicolectomy.  Clinical Impression  Pt admitted secondary to problem above with deficits below. Pt requiring mod A +2 for all mobility tasks and only able to maintain standing for brief period before having to sit for seated rest. Spoke with daughter about SNF vs HHPT and pt's daughter would like to speak more with patient about the options prior to making a decision. Will continue to follow acutely to maximize functional mobility independence and safety.        Recommendations for follow up therapy are one component of a multi-disciplinary discharge planning process, led by the attending physician.  Recommendations may be updated based on patient status, additional functional criteria and insurance authorization.  Follow Up Recommendations Skilled nursing-short term rehab (<3 hours/day) (vs. HHPT should family be able to provide necessary assist) Can patient physically be transported by private vehicle: No    Assistance Recommended at Discharge Frequent or constant Supervision/Assistance  Patient can return home with the following  Two people to help with walking and/or transfers;Two people to help with bathing/dressing/bathroom;Assistance with cooking/housework;Help with stairs or ramp for entrance;Assist for transportation    Equipment Recommendations Other (comment) (would benefit from stedy for transfers)  Recommendations for Other Services       Functional Status Assessment Patient has had a recent decline in their functional status and demonstrates the ability to make significant improvements in function in a reasonable and predictable amount  of time.     Precautions / Restrictions Precautions Precautions: Fall Restrictions Weight Bearing Restrictions: No      Mobility  Bed Mobility Overal bed mobility: Needs Assistance Bed Mobility: Supine to Sit, Sit to Supine     Supine to sit: Mod assist, +2 for physical assistance, +2 for safety/equipment Sit to supine: Mod assist, +2 for physical assistance   General bed mobility comments: Mod A +2 for truncal management and BLE    Transfers Overall transfer level: Needs assistance Equipment used: 2 person hand held assist Transfers: Sit to/from Stand Sit to Stand: Mod assist, +2 physical assistance, +2 safety/equipment           General transfer comment: Mod A +2 to power up and steady, unable to maintain stand more than 15 seconds    Ambulation/Gait                  Stairs            Wheelchair Mobility    Modified Rankin (Stroke Patients Only)       Balance Overall balance assessment: Needs assistance Sitting-balance support: Bilateral upper extremity supported, Feet unsupported Sitting balance-Leahy Scale: Fair     Standing balance support: Bilateral upper extremity supported Standing balance-Leahy Scale: Poor Standing balance comment: Reuires hands on assist to maintain upright posture.                             Pertinent Vitals/Pain Pain Assessment Pain Assessment: Faces Faces Pain Scale: Hurts little more Pain Location: generalized Pain Descriptors / Indicators: Grimacing, Guarding Pain Intervention(s): Limited activity within patient's tolerance, Monitored during session, Repositioned    Home Living Family/patient expects to be discharged to::  Private residence Living Arrangements: Children Available Help at Discharge: Available 24 hours/day;Family Type of Home: House Home Access: [place]: One [place]: [place] (4 wheels);Wheelchair - manual;BSC/3in1;Other  (comment) Additional Comments: 2-3L home O2    Prior Function Prior Level of Function : Needs assist             Mobility Comments: primarily transferring to/from w/c with family assist, can propel self short distances ADLs Comments: Family assisting with BSC transfers, all bathing/dressing tasks.     Hand Dominance   Dominant Hand: Right    Extremity/Trunk Assessment   Upper Extremity Assessment Upper Extremity Assessment: Defer to OT evaluation    Lower Extremity Assessment Lower Extremity Assessment: Generalized weakness    Cervical / Trunk Assessment Cervical / Trunk Assessment: Kyphotic  Communication   Communication: HOH  Cognition Arousal/Alertness: Lethargic Behavior During Therapy: Flat affect Overall Cognitive Status: Impaired/Different from baseline Area of Impairment: Problem solving                             Problem Solving: Slow processing, Requires verbal cues General Comments: Fearful of falling. Requiring increased time to follow commands        General Comments General comments (skin integrity, edema, etc.): VSS on 3.5L    Exercises     Assessment/Plan    PT Assessment Patient needs continued PT services  PT Problem List Decreased strength;Decreased activity tolerance;Decreased balance;Decreased mobility;Decreased cognition;Decreased knowledge of use of DME;Decreased safety awareness;Decreased knowledge of precautions       PT Treatment Interventions DME instruction;Stair training;Gait training;Functional mobility training;Therapeutic exercise;Therapeutic activities;Balance training;Patient/family education    PT Goals (Current goals can be found in the Care Plan section)  Acute Rehab PT Goals Patient Stated Goal: to go home PT Goal Formulation: With patient/family Time For Goal Achievement: 08/20/21 Potential to Achieve Goals: Good    Frequency Min 3X/week     Co-evaluation PT/OT/SLP Co-Evaluation/Treatment:  Yes Reason for Co-Treatment: For patient/therapist safety;To address functional/ADL transfers PT goals addressed during session: Mobility/safety with mobility;Balance OT goals addressed during session: Strengthening/ROM       AM-PAC PT "6 Clicks" Mobility  Outcome Measure Help needed turning from your back to your side while in a flat bed without using bedrails?: A Lot Help needed moving from lying on your back to sitting on the side of a flat bed without using bedrails?: Total Help needed moving to and from a bed to a chair (including a wheelchair)?: Total Help needed standing up from a chair using your arms (e.g., wheelchair or bedside chair)?: Total Help needed to walk in hospital room?: Total Help needed climbing 3-5 steps with a railing? : Total 6 Click Score: 7    End of Session Equipment Utilized During Treatment: Gait belt Activity Tolerance: Patient limited by lethargy Patient left: in bed;with call bell/phone within reach;with family/visitor present (on stretcher in ED) Nurse Communication: Mobility status PT Visit Diagnosis: Unsteadiness on feet (R26.81);Muscle weakness (generalized) (M62.81);Difficulty in walking, not elsewhere classified (R26.2)    Time: 1610-9604 PT Time Calculation (min) (ACUTE ONLY): 24 min   Charges:   PT Evaluation $PT Eval Moderate Complexity: 1 Mod          Reuel Derby, PT, DPT  Acute Rehabilitation Services  Office: [phone]   Rudean Hitt 08/06/2021, 1:30 PM

## 2021-08-06 NOTE — Assessment & Plan Note (Addendum)
Patient takes 0.5 to 1 mg Klonopin nightly for sleep. - Continue Klonopin given low dose and palliative measure

## 2021-08-06 NOTE — Progress Notes (Signed)
FMTS Interim Progress Note  S:Patient and daughter in room. Patient endorses feeling better today after getting 1u blood transfusion. Daughter reports distress over colostomy bag overdue for change. Started discussion with patient and daughter regarding risk and benefits of continuing Eliquis in the outpatient setting given her risk for repeat hospitalization for acute anemia despite stroke risk due to A-fib.  O: BP 111/73   Pulse (!) 59   Temp 98.6 F (37 C) (Oral)   Resp 16   Ht '5\' 1"'$  (1.549 m)   Wt 90.4 kg   SpO2 100%   BMI 37.66 kg/m     General: Resting comfortably in hospital bed. No acute distress.  A/P: Patient stable. Feeling better. CHADVASC - 5 (10% risk of stroke), HAS-BLED score 3 (5.8% risk of major bleed). Hx of GI bleeding due to AVM. -Dialysis in AM.  -Follow-Up H&H -Continue holding Eliquis  Salvadore Oxford, MD 08/06/2021, 9:23 PM PGY-1, [place] [facility] pager [phone]
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
## 2021-08-06 NOTE — Hospital Course (Addendum)
[name] is a 81 y.o. female with pertinent PMHx of Lynch syndrome, colon cancer s/p right hemicolectomy, small bowel AVM, and renal cell cancer, now ESRD on dialysis who presented for weakness. Patient was admitted to [place] service for acute on chronic anemia and hypotension. Please see problem based Hospital Course below:  Weakness Patient was admitted with acute on chronic weakness. She was weak in the setting of increased bleeding from around her stoma and a missed dialysis session. Patient did not have any focal findings. She was transfused with 1u RBC (7/12)and given iron once (7/13) during her admission. ACTH stim testing was unremarkable. GI was consulted as patient has history of AVM bleeds and elected to take conservative treatments. She was seen by PT during her admission and continued dialysis TTHS.   Acute on chronic anemia Patient presented to ED with hemoglobin of 8.1 on admission, an acute change from her baseline of around 9. In the ED, her fecal occult blood test was positive concerning for new gastrointestinal bleed source. GI was consulted and recommended conservative treatment as per patient's wishes.  They recommended CBC checks with dialysis and blood and iron transfusions as needed.  It was stable and hemostatic on discharge. Dr. [name] (Cardiology), patient, and primary team have agreed to restart Eliquis on the 21st of July per patient's wishes.    ESRD (end stage renal disease) on dialysis [facility]) Patient presented to hospital after missed dialysis session on 08/02/21, but attended session on 08/05/21 (day of admission). Basic metabolites were reassuring in ED with patient's creatinine at 4.57 around baseline of 5. Nephrology was consulted on 7/12.  Hemodialysis was continued during admission. Patient has transportation to dialysis set up through [facility].   Hypotension Blood pressure in the ED ranged from 83-151 systolic over 76-16 diastolic, WBC elevated  to 13.0, LA 3.4, and CXR showed new bilateral pleural effusions. Patient started on cefepime in ED with concern for sepsis. Patient started on home midodrine.  Blood pressure was controlled during admission.  Cortisol was 6.2.  ACTH stim test showed low risk for primary adrenal insufficiency. She generally maintained MAP >65 while on Midodrine TID.    Issues for Follow-Up: Evaluate bleeding and hemoglobin with CBC. Evaluate bleeding from stoma site. Follow up any missed dialysis sessions. Continue PT outside the hospital Follow up blood pressure at clinic  Follow up palliative care, continued goals of care, and understanding of disease progression

## 2021-08-07 ENCOUNTER — Telehealth: Payer: Self-pay

## 2021-08-07 DIAGNOSIS — N186 End stage renal disease: Secondary | ICD-10-CM | POA: Diagnosis not present

## 2021-08-07 DIAGNOSIS — R531 Weakness: Secondary | ICD-10-CM | POA: Diagnosis not present

## 2021-08-07 DIAGNOSIS — D649 Anemia, unspecified: Secondary | ICD-10-CM | POA: Diagnosis not present

## 2021-08-07 DIAGNOSIS — I132 Hypertensive heart and chronic kidney disease with heart failure and with stage 5 chronic kidney disease, or end stage renal disease: Secondary | ICD-10-CM | POA: Diagnosis not present

## 2021-08-07 DIAGNOSIS — I480 Paroxysmal atrial fibrillation: Secondary | ICD-10-CM

## 2021-08-07 DIAGNOSIS — Z992 Dependence on renal dialysis: Secondary | ICD-10-CM | POA: Diagnosis not present

## 2021-08-07 LAB — ACTH STIMULATION, 3 TIME POINTS
Cortisol, 30 Min: 21 ug/dL
Cortisol, 60 Min: 25.1 ug/dL
Cortisol, Base: 14.6 ug/dL

## 2021-08-07 LAB — COMPREHENSIVE METABOLIC PANEL
ALT: 33 U/L (ref 0–44)
AST: 49 U/L — ABNORMAL HIGH (ref 15–41)
Albumin: 2.1 g/dL — ABNORMAL LOW (ref 3.5–5.0)
Alkaline Phosphatase: 211 U/L — ABNORMAL HIGH (ref 38–126)
Anion gap: 11 (ref 5–15)
BUN: 46 mg/dL — ABNORMAL HIGH (ref 8–23)
CO2: 25 mmol/L (ref 22–32)
Calcium: 8.4 mg/dL — ABNORMAL LOW (ref 8.9–10.3)
Chloride: 101 mmol/L (ref 98–111)
Creatinine, Ser: 7.11 mg/dL — ABNORMAL HIGH (ref 0.44–1.00)
GFR, Estimated: 5 mL/min — ABNORMAL LOW (ref >60)
Glucose, Bld: 156 mg/dL — ABNORMAL HIGH (ref 70–99)
Potassium: 4.4 mmol/L (ref 3.5–5.1)
Sodium: 137 mmol/L (ref 135–145)
Total Bilirubin: 0.8 mg/dL (ref 0.3–1.2)
Total Protein: 5.3 g/dL — ABNORMAL LOW (ref 6.5–8.1)

## 2021-08-07 LAB — TYPE AND SCREEN
ABO/RH(D): A POS
Antibody Screen: NEGATIVE
Unit division: 0

## 2021-08-07 LAB — CBC
HCT: 29.1 % — ABNORMAL LOW (ref 36.0–46.0)
Hemoglobin: 8.6 g/dL — ABNORMAL LOW (ref 12.0–15.0)
MCH: 24.6 pg — ABNORMAL LOW (ref 26.0–34.0)
MCHC: 29.6 g/dL — ABNORMAL LOW (ref 30.0–36.0)
MCV: 83.4 fL (ref 80.0–100.0)
Platelets: 200 10*3/uL (ref 150–400)
RBC: 3.49 MIL/uL — ABNORMAL LOW (ref 3.87–5.11)
RDW: 17.8 % — ABNORMAL HIGH (ref 11.5–15.5)
WBC: 9 10*3/uL (ref 4.0–10.5)
nRBC: 0.2 % (ref 0.0–0.2)

## 2021-08-07 LAB — BPAM RBC
Blood Product Expiration Date: 202307192359
ISSUE DATE / TIME: 202307121538
Unit Type and Rh: 6200

## 2021-08-07 LAB — HEMOGLOBIN AND HEMATOCRIT, BLOOD
HCT: 28.8 % — ABNORMAL LOW (ref 36.0–46.0)
Hemoglobin: 8.6 g/dL — ABNORMAL LOW (ref 12.0–15.0)

## 2021-08-07 MED ORDER — DARBEPOETIN ALFA 200 MCG/0.4ML IJ SOSY
200.0000 ug | PREFILLED_SYRINGE | INTRAMUSCULAR | Status: DC
  Administered 2021-08-09: 200 ug via INTRAVENOUS
  Filled 2021-08-07: qty 0.4

## 2021-08-07 MED ORDER — SODIUM CHLORIDE 0.9 % IV SOLN
250.0000 mg | Freq: Every day | INTRAVENOUS | Status: AC
  Administered 2021-08-07 – 2021-08-10 (×4): 250 mg via INTRAVENOUS
  Filled 2021-08-07 (×4): qty 20

## 2021-08-07 MED ORDER — SODIUM CHLORIDE 0.9 % IV SOLN
250.0000 mg | Freq: Every day | INTRAVENOUS | Status: DC
  Filled 2021-08-07: qty 20

## 2021-08-07 MED ORDER — CLOBETASOL PROPIONATE 0.05 % EX CREA
TOPICAL_CREAM | Freq: Two times a day (BID) | CUTANEOUS | Status: DC
  Filled 2021-08-07: qty 15

## 2021-08-07 MED ORDER — SILVER NITRATE-POT NITRATE 75-25 % EX MISC
1.0000 | CUTANEOUS | Status: DC | PRN
  Filled 2021-08-07: qty 1

## 2021-08-07 NOTE — Progress Notes (Addendum)
Late entry - afternoon family conversation  PGY [place] went to bedside to inform her of stable hemoglobin results and talk to family.  At the bedside, MD found daughter, son-in-law, and son.  Daughter had several concerns.  First of which, was the delay in getting her mom's ostomy pouch changed.  She also felt that the bleeding from the ostomy site was not addressed in a timely manner.  Second, she relays that she and the patient not been made aware of certain testing or plans before the blood draw comes.  Specifically, the ACTH stim test this morning that required multiple blood draws.  Third, she relayed that her mothers medical history is quite complex and she does not feel that her mother should be cared for by [facility]. She did voiced uncertainty as to why Dr. Ronnald Ramp or Dr. Adah Salvage from the family medicine clinic have not been by yet to evaluate her mother.  We discussed: - Stable hemoglobin from the afternoon draw - Current hemostasis of ostomy bleed, plan to use silver nitrate if needed for repeat bleeding - Possible discharge tomorrow given continued hemostasis - Talking with TOC about getting them different DME at home, specifically more adjustable hospital bed and a Hoyer lift that is more comfortable and did not squish the patient - Talking with TOC about possibly obtaining outpatient transportation through insurance to take patient from home to dialysis - Continuation of home health PT and family's ability to care for patient at home - Difference between palliative and hospice care, along with patient's desired ability to continue dialysis given her closeness and friendships with the other people at the outpatient dialysis unit.  Family is amenable to palliative consult. - Poor prognosis given ESRD on HD status and worsening fatigue and strength  Ezequiel Essex, MD

## 2021-08-07 NOTE — Telephone Encounter (Signed)
[name] calls nurse line requesting to speak with Dr. Adah Salvage as soon as possible regarding patient's care. Advised that Dr. Adah Salvage is not currently in the office but that I could send him a message.   Daughter voices concerns with bleeding from ostomy site. She states that the ostomy nurse is in the room now evaluating the site.   Daughter is also requesting that Dr. Gwendolyn Lima be contacted to come back to bedside. Resident paged at [phone] to discuss further.   Talbot Grumbling, RN

## 2021-08-07 NOTE — Progress Notes (Signed)
[facility] Progress Note  Subjective: seen in room.   Vitals:   08/06/21 2010 08/07/21 0024 08/07/21 0413 08/07/21 0853  BP: 111/73 (!) 113/52 (!) 111/52 (!) 131/55  Pulse:  62 65 63  Resp:  '17 16 16  '$ Temp: 98.6 F (37 C)   97.7 F (36.5 C)  TempSrc: Oral   Oral  SpO2: 100% 100% 100% 100%  Weight:      Height:        Exam: Gen lethargic, chronically ill appearing, frail, elderly female No jvd or bruits Chest clear bilat to bases RRR no MRG Abd soft ntnd no mass or ascites +bs Ext 1+ pretib edema Neuro is nonfocal    LIJ TDC intact        Home meds include - amiodarone, apixaban, clonazepam prn, famotidine, metoclopramide 5 qid, rena-vit, home O2, sevelamer carbonate 800 ac tid, synthroid, vits/ supps/ prns        OP HD: TTS [facility]  3.5h  400/ 1.5  88.5kg   2/2 bath  LIJ TDC  Hep 2700 - last HD 7/11, coming off 3-4 kg over - mircera 225 q2 last 6/29, last Hb 7.3 - calcitriol 0.25 mcg po tiw   CXR 7/11 - IMPRESSION: 1. New small bilateral pleural effusions. 2. New left basilar atelectasis/airspace disease. 3. Stable cardiomegaly.     Na 136  K 4.1 BUN 35  Cr 5.6  Ca 8.2  phos 5.3  Hb 7.0  WBC 10k  alb 2.5    FOB + in ED   Assessment/ Plan: Generalized weakness - unclear cause, n/v at home, anemic here w/ FOB+. Transfused prbc's and reportedly feeling better.  Debility/ FTT - long-term decline over the past 1-2 yrs Anemia - of CKD +/- other Hypotension - stable BP's in 90s- 110s here.  ESRD - on HD TTS. Plan for HD today.  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 Tsat very low 5%. Pt's dtr concerned about reactions to IV Fe in the past.  Needs IV Fe or the esa's won't work. Pharmacy will discuss w/ the dtr.  MBD ckd - CCa and phos in range. Cont renagel as binder, cont po vdra w hd.  Atrial fib - takes amio, eliquis at home Hypothyroidism - per pmd       Rob Aadil Sur 08/07/2021, 9:41  AM   Recent Labs  Lab 08/06/21 0538 08/06/21 2244 08/07/21 0507  HGB 7.0* 9.0* 8.6*  ALBUMIN 2.2*  --  2.1*  CALCIUM 8.2*  --  8.4*  PHOS 5.3*  --   --   CREATININE 5.65*  --  7.11*  K 4.1  --  4.4   Recent Labs  Lab 08/06/21 0538  IRON 10*  TIBC 300   Inpatient medications:  sodium chloride   Intravenous Once   amiodarone  200 mg Oral Daily   calcitRIOL  0.25 mcg Oral Q T,Th,Sa-HD   Chlorhexidine Gluconate Cloth  6 each Topical Q0600   clobetasol cream   Topical BID   clonazePAM  0.5 mg Oral Once per day on Tue Thu Sat   clonazePAM  1 mg Oral Once per day on Sun Mon Wed Fri   [START ON 08/09/2021] darbepoetin (ARANESP) injection - DIALYSIS  200 mcg Intravenous Q Sat-HD   famotidine  10 mg Oral BID   levothyroxine  175 mcg Oral Q0600   metoCLOPramide  5 mg Oral TID AC &  HS   midodrine  10 mg Oral TID WC   multivitamin  1 tablet Oral QHS    acetaminophen **OR** acetaminophen, polyethylene glycol

## 2021-08-07 NOTE — Progress Notes (Signed)
[facility] ROUNDING NOTE   Subjective: Bleeding from around ostomy site this morning.  Ostomy bag removed and area treated by Wound Ostomy care service and new ostomy bag placed.  Otherwise good response to 2 unit PRBC transfusion yesterday with H/H 8.6/29 today.   Objective: Vital signs in last 24 hours: Temp:  [97.6 F (36.4 C)-98.6 F (37 C)] 97.7 F (36.5 C) (07/13 0853) Pulse Rate:  [59-77] 63 (07/13 0853) Resp:  [13-18] 16 (07/13 0853) BP: (111-131)/(52-80) 131/55 (07/13 0853) SpO2:  [97 %-100 %] 100 % (07/13 0853) Weight:  [90.4 kg] 90.4 kg (07/12 1638) Last BM Date : 08/06/21 General: NAD Abdomen: Ostomy with bag in place in LLQ.  Bag not removed as this was just secured into place by ostomy care service.  Abdomen otherwise soft, NT, ND    Intake/Output from previous day: 07/12 0701 - 07/13 0700 In: 500 [Blood:500] Out: 30 [Stool:30] Intake/Output this shift: No intake/output data recorded.   Lab Results: Recent Labs    08/05/21 1641 08/05/21 2025 08/06/21 0538 08/06/21 2244 08/07/21 0507  WBC 13.0*  --  10.4  --  9.0  HGB 8.1*   < > 7.0* 9.0* 8.6*  PLT 269  --  217  --  200  MCV 83.7  --  84.8  --  83.4   < > = values in this interval not displayed.   BMET Recent Labs    08/05/21 1641 08/05/21 2025 08/06/21 0538 08/07/21 0507  NA 135 135 136 137  K 4.4 4.2 4.1 4.4  CL 95* 99 98 101  CO2 24  --  26 25  GLUCOSE 158* 129* 132* 156*  BUN 28* 35* 35* 46*  CREATININE 4.57* 5.40* 5.65* 7.11*  CALCIUM 8.0*  --  8.2* 8.4*   LFT Recent Labs    08/05/21 1641 08/06/21 0538 08/07/21 0507  PROT 6.0*  --  5.3*  ALBUMIN 2.5* 2.2* 2.1*  AST 65*  --  49*  ALT 33  --  33  ALKPHOS 217*  --  211*  BILITOT 0.8  --  0.8   PT/INR No results for input(s): "INR" in the last 72 hours.    Imaging/Other results: DG Chest Port 1 View  Result Date: 08/05/2021 CLINICAL DATA:  Syncope. EXAM: PORTABLE CHEST 1 VIEW COMPARISON:  Chest x-ray  05/22/2021 FINDINGS: Left-sided central venous catheter tip projects over the cavoatrial junction, unchanged. The heart is enlarged, unchanged. There are new small bilateral pleural effusions, left greater than right. There is new patchy opacity in the left lung base. There is no pneumothorax or acute fracture. IMPRESSION: 1. New small bilateral pleural effusions. 2. New left basilar atelectasis/airspace disease. 3. Stable cardiomegaly. Electronically Signed   By: Ronney Asters M.D.   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  Could consider surgical consult to see if there is anything else that could be done (silver nitrate application?).  Otherwise no role for endoscopic intervention - I again discussed these recommendations with the patient's daughter at length at bedside.  She again iterates that her mother does not want any endoscopic evaluation unless critical, life-threatening bleed, and even at that not sure she would want to  undergo given risks related to her underlying comorbidities - Continue serial CBC checks with additional blood products as needed per protocol  6) ESRD - HD per Nephrology service  7) Atrial fibrillation 8) Chronic anticoagulation - Defer discussion of whether or not to restart home Eliquis given ostomy bleeding and prior history of GI bleed to primary inpatient service  GI service will sign off at this time.  Please contact if additional questions or concerns   Lavena Bullion, DO  08/07/2021, 11:30 AM [facility] Pager [phone]

## 2021-08-07 NOTE — Progress Notes (Addendum)
Daily Progress Note Intern Pager: [phone]  Patient name: [name] Medical record number: [id number] Date of birth: 10/18/1940 Age: 81 y.o. Gender: female  Primary Care Provider: [name], MD Consultants: Nephrology, gastroenterology, wound care Code Status: DNR  Pt Overview and Major Events to Date:  7/11-admitted, held Eliquis  Assessment and Plan: Mahlet L. Strum is a 81 year old female with pertinent medical history of Lynch syndrome, colon cancer s/p right hemicolectomy, small bowel AVM, renal cancer now ESRD on dialysis, who presented with weakness increased from baseline most likely secondary to deconditioning and acute on chronic anemia, exacerbated by fluid imbalance D/T missed dialysis session.  * Weakness Patient presented with acute change in strength and ambulation. On exam, no focal deficits, strength 5/5 in extremities, unable to test ambulation. Likely due to fluid imbalance from missed dialysis session versus acute on chronic anemia. -PT/OT -Fall precautions -Delirium precautions -Tylenol as needed for pain  Acute on chronic anemia Patient presented with increased fatigue and weakness. Hg 8.1 on admission, 7.7 on 08/04/2021, baseline around 9.3.  MCV normocytic at 83.7.  Fecal occult blood test positive.  Concern for increased GI bleed versus worsening renal anemia. On Eliquis for A-fib. Transfusion threshold <8.  S/p 1 transfusion, 8.6 hemoglobin this morning -GI consulted, recs appreciated  -  Can check CBC as outpatient periodically with hemodialysis sessions with plan for RBC transfusion or IV iron prn   - Patient would prefer conservative measures  -Hold Eliquis, would like to restart on discharge, discussed risk versus benefits  ESRD (end stage renal disease) on dialysis ([place]) TTHS dialysis schedule. Patient presented today after missed dialysis session on 08/02/21 -Consult Nephrology for dialysis, appreciate recs, dialysis today -RFP  daily -Strict I/O -Daily weights -Continue home Renvela  Elevated liver transaminase level AST elevated to 65. Alk Phos 217, total bili normal. Patient has no complaint of stomach pain.  No signs of jaundice on exam.  Denies alcohol use.  Hepatitis B surface antigen and hepatitis B S antibody nonreactive on 06/24/2021.  Patient on amiodarone at home which may be contributing.  -Alk phos 211, AST 49, ALT within normal limits (stable from before) -Continue amiodarone, benefit outweighs risk  A-fib [facility]) Patient asymptomatic.  On exam and on telemetry patient in A-fib. - Currently holding home Eliquis in the setting of GI bleed, patient prefers to restart though there is risk of increased bleeding, requiring transfusions. - Continue home amiodarone  Hypotension As BP on admission 90s-110s, patient on home midodrine.  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  Less likely sepsis given repeat lactic acid decreased from 3.4-2.4.  AM cortisol 6.5, ACTH stim test suggests adrenal suppression is minimal as 60 minutes post stimulation was 25, rise from baseline cortisol was around 7. -Continue home midodrine- 10 mg TID  -D/C cefepime given stable -Monitor vitals, try to maintain MAP >60 -Follow-up blood cultures  Skin ulcer of abdominal wall (HCC) On LLQ. 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 Was started on Prednisone burst, scheduled to finish 7/14 (but has missed two doses thus far). - Wound care consult, appreciate recommendations -Hold Prednisone in the setting of hypotension on admission and low cortisol   Hypothyroidism Last last TSH from 02/16/2021 0.756. - Continue home levothyroxine  Insomnia and Anxiety  Patient takes 0.5 to 1 mg Klonopin nightly for sleep. - Continue Klonopin  given low dose  and palliative measure  Diabetic gastroparesis (HCC) - Continue home Reglan - Continue home Pepcid   FEN/GI: Dysphagia 3 PPx: Holding in the setting of bleeding Dispo: PT OT recommended SNF, will discuss with family, pending clinical improvement  Subjective:  Patient is sleepy this morning.  Daughter states that patient was disoriented and slightly aggressive with nursing this morning regarding blood draws.  They were confused why we were doing blood draws.  They state that they would want conservative measures.  Patient endorses feeling more fluid.  They state that they would want to go home rather than SNF.  Patient also endorses that she would prefer to be on Eliquis and have higher bleeding risks, require regular CBCs and transfusion then the risk of clot and stroke off of Eliquis.  Objective: Temp:  [97.6 F (36.4 C)-98.6 F (37 C)] 97.7 F (36.5 C) (07/13 0853) Pulse Rate:  [59-77] 63 (07/13 0853) Resp:  [13-18] 16 (07/13 0853) BP: (96-131)/(52-80) 131/55 (07/13 0853) SpO2:  [97 %-100 %] 100 % (07/13 0853) Weight:  [90.4 kg] 90.4 kg (07/12 1638) Physical Exam: General: Sleeping in bed Cardiovascular: Regular rate and rhythm Respiratory: Breathing comfortably on 3.5 L Abdomen: Soft, nondistended, nontender to palpation Extremities: 2+ pitting edema to upper thighs  Laboratory: Most recent CBC Lab Results  Component Value Date   WBC 9.0 08/07/2021   HGB 8.6 (L) 08/07/2021   HCT 29.1 (L) 08/07/2021   MCV 83.4 08/07/2021   PLT 200 08/07/2021   Most recent BMP    Latest Ref Rng & Units 08/07/2021    5:07 AM  BMP  Glucose 70 - 99 mg/dL 156   BUN 8 - 23 mg/dL 46   Creatinine 0.44 - 1.00 mg/dL 7.11   Sodium 135 - 145 mmol/L 137   Potassium 3.5 - 5.1 mmol/L 4.4   Chloride 98 - 111 mmol/L 101   CO2 22 - 32 mmol/L 25   Calcium 8.9 - 10.3 mg/dL 8.4   Cortisol base 14.6, cortisol 30 minutes 21.0 cortisol 60 minutes 25.1  Lowry Ram, MD 08/07/2021, 9:47 AM  PGY-1,  [place] Intern pager: [phone], text pages welcome Secure chat group [place]

## 2021-08-07 NOTE — Consult Note (Signed)
[place] Nurse ostomy consult note Patient receiving care in [facility] Stoma type/location: LMQ end ileostomy performed at [place] 2022 Stomal assessment/size: 1 3/8 inch just above the skin level with hernia. Peristomal assessment: erythematous irritated from stool and blood being on the skin.  Treatment options for stomal/peristomal skin: Crusting with no sting barrier wipes and stoma powder Output; mushy brown with blood mixed in  Ostomy pouching: 2pc. 2 1/4 inch HOP pouch Kellie Simmering # [phone]) skin barrier Kellie Simmering # [phone]) Barrier rings Kellie Simmering # [phone]) Stoma powder Kellie Simmering # [phone]) No sting barrier wipes Kellie Simmering # [phone]) Education provided: None not a new pouch Enrolled patient in [place] program: No  Upon removal of the pouch blood began to pool into the skin barrier. Skin barrier removed and blood was spurting from 9 o'clock next to the stoma. Pressure held on the area unsuccessful, no sting barrier wipes cleaned around the stoma, stoma powder spread around the [place] and especially at the 9 o'clock area then powder dabbed with no sting barrier wipes. At that point the bleeding finally stopped, which may start back up once the skin barrier is removed. A barrier ring was placed around the stoma then a new skin barrier and pouch. Dr. Dorris Singh came in just as the pouch was placed but she was able to obtain photos to be placed in the chart. Daughter is very upset and states everyone keeps brushing her off and will not do anything about the bleeding. Dr. Owens Shark states she will consult GI.   Supplies ordered to be placed in the room.   Thank you for the consult. [place] nurse will not follow at this time.   Please re-consult the [place] team if needed.  Cathlean Marseilles Tamala Julian, MSN, RN, [place], Lysle Pearl, [facility] Wound Treatment Associate Pager [phone]

## 2021-08-07 NOTE — TOC Initial Note (Signed)
Transition of Care [facility]) - Initial/Assessment Note    Patient Details  Name: [name] MRN: [id number] Date of Birth: 1940-10-24  [facility] of [facility] CM/SW Contact:    Joanne Chars, LCSW Phone Number: 08/07/2021, 1:23 PM  Clinical Narrative:   Pt oriented x 2 but able to answer questions from [place].  Primary information from daughter [name].  Discussed PT recommendations for SNF.  [name] does not want to pursue SNF.  Pt lives at home with [name], who is on leave from work and home full time, and also pt's other adult son.   [facility] currently in place providing OT/PT/RN.  [name] would like to continue [facility] with [facility] at time of DC. [name] does have some DME needs.  Pt has DME at home (she said from [employer]): hospital bed, hoyer lift, wheelchair, rollator.  [name] reports that the bed does not go high enough and they are having trouble using the hoyer lift.  She would like possibly a different bed that would go higher.  She is also interested in a lift chair and is aware that insurance does not fully cover this.    RNCM [facility] Dy informed that pt wants HH and will follow up.             Expected Discharge Plan: [place] Barriers to Discharge: Continued Medical Work up   Patient Goals and CMS Choice Patient states their goals for this hospitalization and ongoing recovery are:: walking   Choice offered to / list presented to : Adult Children (daughter [name])  Expected Discharge Plan and Services Expected Discharge Plan: [place] In-house Referral: Clinical Social Work   Post Acute Care Choice: [place] arrangements for the past 2 months: [place]                                      Prior Living Arrangements/Services Living arrangements for the past 2 months: Single Family Home Lives with:: Adult Children Patient language and need for interpreter reviewed:: Yes Do you feel safe going back to the place  where you live?: Yes      Need for Family Participation in Patient Care: Yes (Comment) Care giver support system in place?: Yes (comment) Current home services: Home OT, Home PT, Home RN, DME ([facility] in place) Criminal Activity/Legal Involvement Pertinent to Current Situation/Hospitalization: No - Comment as needed  Activities of Daily Living Home Assistive Devices/Equipment: Wheelchair, Oxygen, Ostomy supplies ADL Screening (condition at time of admission) Patient's cognitive ability adequate to safely complete daily activities?: Yes Is the patient deaf or have difficulty hearing?: No Does the patient have difficulty seeing, even when wearing glasses/contacts?: No Does the patient have difficulty concentrating, remembering, or making decisions?: No Patient able to express need for assistance with ADLs?: Yes Does the patient have difficulty dressing or bathing?: Yes Independently performs ADLs?: No Does the patient have difficulty walking or climbing stairs?: Yes Weakness of Legs: Both Weakness of Arms/Hands: None  Permission Sought/Granted Permission sought to share information with : Family Supports Permission granted to share information with : Yes, Verbal Permission Granted  Share Information with NAME: daughter [name]  Permission granted to share info w AGENCY: [facility]        Emotional Assessment Appearance:: Appears stated age Attitude/Demeanor/Rapport: Lethargic Affect (typically observed): Appropriate Orientation: : Oriented to Self, Oriented to Place  Admission diagnosis:  Weakness [R53.1] Failure to thrive in adult [R62.7] ESRD (end stage renal disease) on dialysis ([place]) [N18.6, Z99.2] HCAP (healthcare-associated pneumonia) [J18.9] Sepsis, due to unspecified organism, unspecified whether acute organ dysfunction present [facility]) [A41.9] Patient Active Problem List   Diagnosis Date Noted   Insomnia and Anxiety  08/06/2021   Weakness 08/05/2021   Elevated liver  transaminase level 08/05/2021   2+ pitting edema 08/04/2021   Stage I pressure ulcer of sacral region 07/01/2021   Angiodysplasia of duodenum with hemorrhage    Gastritis without bleeding    Melena    Acute on chronic anemia    Cellulitis of multiple sites of trunk 06/19/2021   Skin ulcer of abdominal wall ([place]) 06/18/2021   Ventral hernia 06/10/2021   Grade II diastolic dysfunction 37/48/2707   Diabetic gastroparesis ([place]) 03/20/2021   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   Pressure injury of skin 02/16/2021   On home oxygen therapy 05/07/2020   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[place]) 03/12/2020   Presence of other vascular implants and grafts 08/22/2019   ESRD (end stage renal disease) on dialysis ([place]) 04/10/2019   Multiple drug allergies 04/07/2019   Gout, unspecified 04/06/2019   Other disorders of phosphorus metabolism 11/18/2018   Iron deficiency anemia, unspecified 10/13/2018   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/2018   Diabetic polyneuropathy ([place]) 07/31/2016   Vitamin B12 deficiency 07/31/2016   Vitamin D deficiency 07/31/2016   Controlled diabetes mellitus type 2 with complications ([place]) 86/75/4492   Hypertensive heart disease    Cancer of renal pelvis, 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/2014   A-fib ([place]) 04/16/2014   CAD (coronary artery disease) 01/11/2013   Hyperlipidemia associated with type 2 diabetes mellitus ([place]) 01/11/2013   Hypertension associated with chronic kidney disease due to type 2 diabetes mellitus ([place])    DM neuropathy, type II diabetes mellitus ([place]) 01/09/2013   Obesity, Class III, BMI 40-49.9 (morbid obesity) ([employer]) 03/05/2010   Hypothyroidism 02/07/2009   Anxiety state 02/07/2009   Hypotension 02/07/2009   Asthma 02/07/2009   GASTROESOPHAGEAL REFLUX DISEASE, CHRONIC 02/07/2009   Osteoarthrosis, unspecified whether generalized or localized, unspecified site 02/07/2009   PCP:  [name],  MD Pharmacy:   [facility], [place]  [address] Phone: [phone] Fax: [place] [address] [place] [address] Phone: [phone] Fax: [phone]  [facility] [place], [place] [address] Phone: [phone] Fax: [phone]     Social Determinants of Health (SDOH) Interventions    Readmission Risk Interventions     No data to display

## 2021-08-07 NOTE — Progress Notes (Signed)
FMTS Interim Progress Note  S:Nurse paged Drs Nita Sells and Ruben Im with concerning blood pressure of 96/38. Directed to follow-up with manual check. Repeat was 99/59. Patient asymptomatic. On follow-up to room, patient without complaints of chest pain, dizziness, or shortness of breath.   Patient and daughter discussed at length that they were unhappy with the amount of blood draws required for ACTH stimulation test, and that they were not told why. Explained necessity for test, and why multiple draws are required. Patient and daughter also worried because they were told that they would leave the hospital tomorrow. Reassured patient, that it is day by day evaluation and contingent on clinical improvement.   Discussed with patient daughter plan for tonight if ostomy site should bleed. Amenable to cauterization with silver nitrate.   O: BP (!) 99/59 (BP Location: Right Wrist)   Pulse (!) 59   Temp 97.8 F (36.6 C) (Oral)   Resp 18   Ht '5\' 1"'$  (1.549 m)   Wt 93 kg   SpO2 100%   BMI 38.74 kg/m    General: A&O, lethargic female lying in hospital bed Cardio: 2+ pitting edema of bilateral lower extremities up to knee GI: No bleeding in or around ostomy bag  A/P: Hypotension -Continue midodrine '10mg'$  TID -Closely monitor blood pressure  Ostomy bleed -Monitor for bleeding -Silver nitrate cauterization if necessary  Salvadore Oxford, MD 08/07/2021, 10:47 PM PGY-1, [place] Medicine Service pager [phone]

## 2021-08-07 NOTE — Progress Notes (Signed)
At 0515, phlebotomy, came and drew cortisal labs, will give Cosyntropin 0.25 mg iv at 0530 as scheduled.  Phlebotomy will come back at 0600 and again at 0630 to draw post medication labs.  MD Need clarification in regards to what kind of ointment is needed for dressing change to LLQ.  None listed on MAR. Daughter does not have ointment at the bedside.

## 2021-08-08 ENCOUNTER — Observation Stay (HOSPITAL_BASED_OUTPATIENT_CLINIC_OR_DEPARTMENT_OTHER): Payer: Medicare HMO

## 2021-08-08 DIAGNOSIS — I132 Hypertensive heart and chronic kidney disease with heart failure and with stage 5 chronic kidney disease, or end stage renal disease: Secondary | ICD-10-CM | POA: Diagnosis not present

## 2021-08-08 DIAGNOSIS — I480 Paroxysmal atrial fibrillation: Secondary | ICD-10-CM | POA: Diagnosis not present

## 2021-08-08 DIAGNOSIS — M7989 Other specified soft tissue disorders: Secondary | ICD-10-CM

## 2021-08-08 DIAGNOSIS — R627 Adult failure to thrive: Secondary | ICD-10-CM | POA: Diagnosis not present

## 2021-08-08 DIAGNOSIS — N186 End stage renal disease: Secondary | ICD-10-CM | POA: Diagnosis not present

## 2021-08-08 DIAGNOSIS — D649 Anemia, unspecified: Secondary | ICD-10-CM | POA: Diagnosis not present

## 2021-08-08 LAB — CBC
HCT: 28.2 % — ABNORMAL LOW (ref 36.0–46.0)
Hemoglobin: 8.3 g/dL — ABNORMAL LOW (ref 12.0–15.0)
MCH: 24.8 pg — ABNORMAL LOW (ref 26.0–34.0)
MCHC: 29.4 g/dL — ABNORMAL LOW (ref 30.0–36.0)
MCV: 84.2 fL (ref 80.0–100.0)
Platelets: 170 10*3/uL (ref 150–400)
RBC: 3.35 MIL/uL — ABNORMAL LOW (ref 3.87–5.11)
RDW: 18 % — ABNORMAL HIGH (ref 11.5–15.5)
WBC: 10.5 10*3/uL (ref 4.0–10.5)
nRBC: 0.2 % (ref 0.0–0.2)

## 2021-08-08 LAB — HEPATITIS B SURFACE ANTIBODY, QUANTITATIVE: Hep B S AB Quant (Post): <3.1 m[IU]/mL — ABNORMAL LOW (ref >9.9)

## 2021-08-08 MED ORDER — CHLORHEXIDINE GLUCONATE CLOTH 2 % EX PADS
6.0000 | MEDICATED_PAD | Freq: Every day | CUTANEOUS | Status: DC
  Administered 2021-08-10 – 2021-08-12 (×3): 6 via TOPICAL

## 2021-08-08 NOTE — Progress Notes (Addendum)
Arrived at patient's bedside to follow up on clinic patient. [name], son and son-inlaw were at bedside.  Patient says she is feeling better today and according to daughter she is making improvements.  Discussed at length with daughter and patient about current treatment plan, goal of care and answered their questions.  Patient and daughter expressed their skepticism about holding off her home Eliquis.  Explained to patient due to active bleeding and severe anemia which is likely contributing to her generalized weakness it is important that we temporarily hold the Eliquis to decrease the bleeding and allow for her hemoglobin to improve.  Reminded patient that we have held her Eliquis in the past for same reason when I saw them in clinic earlier this year.  [name] ([name] and care giver) also expressed patient's skepticism about going to a SNF due to concern that she might have to stay there permanently.  Explained to patient the idea of SNF is a temporary facility to allow patient to continue physical therapy to improve her strength until she is able to return home.  Clarified this is not a permanent stay and when appropriate she should be able to return home.  Also discussed goal of care with daughter who understands patient's poor prognosis given her comorbidity and steady decline in health. Daughter expresses understanding that patient might not have long to live but she wants to continue her schedule dialysis and care. At the end of our conversation family is agreeable to current treatment plan by the inpatient team and appreciative of their work.   [name], MD PGY-2, [place] Medicine Resident

## 2021-08-08 NOTE — NC FL2 (Addendum)
[place] LEVEL OF CARE SCREENING TOOL     IDENTIFICATION  Patient Name: [name] Birthdate: Aug 02, 1940 Sex: female Admission Date (Current Location): 08/05/2021  [place] and [place] Number:  Kathleen Argue [id number] [place] and Address:  The . [facility], [place] [address], [place], [place] [address]      Provider Number: [id number]  Attending Physician Name and Address:  Martyn Malay, MD  Relative Name and Phone Number:  Heinz Knuckles Daughter [phone]    Current Level of Care: Hospital Recommended Level of Care: [place] Prior Approval Number:    Date Approved/Denied:   PASRR Number:    Discharge Plan: SNF    Current Diagnoses: Patient Active Problem List   Diagnosis Date Noted   Swelling of upper arm 08/08/2021   Insomnia and Anxiety  08/06/2021   Weakness 08/05/2021   Elevated liver transaminase level 08/05/2021   2+ pitting edema 08/04/2021   Stage I pressure ulcer of sacral region 07/01/2021   Angiodysplasia of duodenum with hemorrhage    Gastritis without bleeding    Melena    Acute on chronic anemia    Cellulitis of multiple sites of trunk 06/19/2021   Skin ulcer of abdominal wall ([place]) 06/18/2021   Ventral hernia 06/10/2021   Grade II diastolic dysfunction 56/38/7564   Diabetic gastroparesis ([place]) 03/20/2021   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   Pressure injury of skin 02/16/2021   On home oxygen therapy 05/07/2020   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[place]) 03/12/2020   Presence of other vascular implants and grafts 08/22/2019   ESRD (end stage renal disease) on dialysis ([place]) 04/10/2019   Multiple drug allergies 04/07/2019   Gout, unspecified 04/06/2019   Other disorders of phosphorus metabolism 11/18/2018   Iron deficiency anemia, unspecified 10/13/2018   Secondary hyperparathyroidism of renal origin ([place]) 09/21/2018   Diabetic polyneuropathy ([place])  07/31/2016   Vitamin B12 deficiency 07/31/2016   Vitamin D deficiency 07/31/2016   Controlled diabetes mellitus type 2 with complications ([place]) 33/29/5188   Hypertensive heart disease    Cancer of renal pelvis, right ([place]) 12/07/2014   A-fib ([place]) 04/16/2014   CAD (coronary artery disease) 01/11/2013   Hyperlipidemia associated with type 2 diabetes mellitus ([place]) 01/11/2013   Hypertension associated with chronic kidney disease due to type 2 diabetes mellitus ([place])    DM neuropathy, type II diabetes mellitus ([place]) 01/09/2013   Obesity, Class III, BMI 40-49.9 (morbid obesity) ([place]) 03/05/2010   Hypothyroidism 02/07/2009   Anxiety state 02/07/2009   Hypotension 02/07/2009   Asthma 02/07/2009   GASTROESOPHAGEAL REFLUX DISEASE, CHRONIC 02/07/2009   Osteoarthrosis, unspecified whether generalized or localized, unspecified site 02/07/2009    Orientation RESPIRATION BLADDER Height & Weight     Self, Time, Situation, Place  O2 Incontinent Weight: 205 lb 0.4 oz (93 kg) Height:  '5\' 1"'$  (154.9 cm)  BEHAVIORAL SYMPTOMS/MOOD NEUROLOGICAL BOWEL NUTRITION STATUS      Continent Diet (see discharge summary)  AMBULATORY STATUS COMMUNICATION OF NEEDS Skin   Total Care Verbally Other (Comment) (ecchymosis)                       Personal Care Assistance Level of Assistance  Bathing, Feeding, Dressing Bathing Assistance: Maximum assistance Feeding assistance: Limited assistance Dressing Assistance: Maximum assistance     Functional Limitations Info  Sight, Hearing, Speech Sight Info: Adequate Hearing Info: Adequate Speech Info: Adequate    SPECIAL CARE  FACTORS FREQUENCY  PT (By licensed PT), OT (By licensed OT)     PT Frequency: 5x week OT Frequency: 5x week            Contractures Contractures Info: Not present    Additional Factors Info  Code Status, Allergies Code Status Info: DNR Allergies Info: Adhesive (Tape), Avelox (Moxifloxacin), Banana, Blueberry Flavor, Cantaloupe  Extract Allergy Skin Test, Cefprozil, Cetacaine (Butamben-tetracaine-benzocaine), Food, Januvia (Sitagliptin), Lipitor (Atorvastatin), Losartan Potassium, Nitroglycerin, Omeprazole, Oxycodone, Penicillins, Vancomycin, Watermelon (Citrullus Vulgaris), Dicyclomine, Hydrocodone, Imdur (Isosorbide Nitrate), Latex, Tamiflu (Oseltamivir), Feraheme (Ferumoxytol), Other, Renvela (Sevelamer Carbonate), Lasix (Furosemide), Mupirocin           Current Medications (08/08/2021):  This is the current hospital active medication list Current Facility-Administered Medications  Medication Dose Route Frequency Provider Last Rate Last Admin   0.9 %  sodium chloride infusion (Manually program via Guardrails IV Fluids)   Intravenous Once Salvadore Oxford, MD       acetaminophen (TYLENOL) tablet 650 mg  650 mg Oral Q6H PRN Sharion Settler, DO       Or   acetaminophen (TYLENOL) suppository 650 mg  650 mg Rectal Q6H PRN Sharion Settler, DO       amiodarone (PACERONE) tablet 200 mg  200 mg Oral Daily Ezequiel Essex, MD   200 mg at 08/08/21 5631   calcitRIOL (ROCALTROL) capsule 0.25 mcg  0.25 mcg Oral Q T,Th,Sa-HD Roney Jaffe, MD   0.25 mcg at 08/07/21 1904   Chlorhexidine Gluconate Cloth 2 % PADS 6 each  6 each Topical Q0600 Roney Jaffe, MD   6 each at 08/07/21 1324   clobetasol cream (TEMOVATE) 0.05 %   Topical BID Lowry Ram, MD   Given at 08/08/21 0930   clonazePAM (KLONOPIN) tablet 1 mg  1 mg Oral Once per day on Sun Mon Wed Fri Joselyn Glassman A, RPH   1 mg at 08/06/21 2213   [START ON 08/09/2021] Darbepoetin Alfa (ARANESP) injection 200 mcg  200 mcg Intravenous Q Sat-HD Donnamae Jude, RPH       famotidine (PEPCID) tablet 10 mg  10 mg Oral BID Sharion Settler, DO   10 mg at 08/08/21 4970   ferric gluconate (FERRLECIT) 250 mg in sodium chloride 0.9 % 250 mL IVPB  250 mg Intravenous Daily Roney Jaffe, MD 67.5 mL/hr at 08/08/21 0928 250 mg at 08/08/21 0928   levothyroxine (SYNTHROID) tablet  175 mcg  175 mcg Oral Q0600 Sharion Settler, DO   175 mcg at 08/08/21 2637   metoCLOPramide (REGLAN) tablet 5 mg  5 mg Oral TID AC & HS Espinoza, Alejandra, DO   5 mg at 08/08/21 1124   midodrine (PROAMATINE) tablet 10 mg  10 mg Oral TID WC Espinoza, Alejandra, DO   10 mg at 08/08/21 1124   multivitamin (RENA-VIT) tablet 1 tablet  1 tablet Oral QHS Espinoza, Alejandra, DO   1 tablet at 08/07/21 2308   polyethylene glycol (MIRALAX / GLYCOLAX) packet 17 g  17 g Oral Daily PRN Sharion Settler, DO       silver nitrate applicators applicator 1 Stick  1 Stick Topical PRN Orvis Brill, DO         Discharge Medications: Please see discharge summary for a list of discharge medications.  Relevant Imaging Results:  Relevant Lab Results:   Additional Information SSN: [id number], HD pt TTS at [facility], [place]

## 2021-08-08 NOTE — Discharge Instructions (Addendum)
Dear [name],  Thank you for letting us participate in your care. You were hospitalized for Weakness and diagnosed with symptomatic anemia.   POST-HOSPITAL & CARE INSTRUCTIONS WAIT to restart your Eliquis for one week. START on 7/21.  Please follow up with your primary care doctor within one week of hospitalization.  Talk with your heart doctor about continuing your Eliquis.  Go to your follow up appointments (listed below)  DOCTOR'S APPOINTMENT   Future Appointments  Date Time Provider [place]  08/13/2021 10:10 AM ACCESS TO CARE POOL [facility] [place]  08/28/2021  1:55 PM [name], MD [facility]  09/18/2021  2:20 PM [name], Peter M, MD [facility] [place]. Go on 08/13/2021.   Specialty: Family Medicine Why: At 10:10 am. Please arrive by 9:55 am. This is your hospital follow up appointment. You will see our "same day" appointment provider. Contact information: [address] [id number] mc [address] [place]        [name], MD. Daphane Shepherd on 08/28/2021.   Specialty: Family Medicine Why: At 1:55 pm. Please arrive by 1:40 pm. This is your follow up appointment with your primary care doctor. Contact information: [place] [address] [phone]         [name], Peter M, MD .   Specialty: Cardiology Contact information: [address] [place] [address] [phone]               Take care and be well!  [facility]  [place], [place] [address] [phone]

## 2021-08-08 NOTE — Progress Notes (Signed)
FMTS Brief Progress Note  In to see patient in PM. Patient still feeling weak, wants to work with PT to see strength today. Family deciding between SNF vs. HHPT. They also provide care to Mrs. Roger's husband.   LUE with ecchymoses, moderate edema, mild pain, no deformity.  Talkative, appropriate, interactive with family. Stoma examined, no bleeding present.  - Continue IV iron therapy - PT/OT this PM--will see how her strength is to determine if SNF. Vs HHTP - Follow up duplex of L arm  Dorris Singh, MD  Family Medicine Teaching Service

## 2021-08-08 NOTE — Progress Notes (Signed)
LUE venous duplex has been completed.   Results can be found under chart review under CV PROC. 08/08/2021 2:42 PM Yasseen Salls RVT, RDMS

## 2021-08-08 NOTE — Progress Notes (Signed)
Physical Therapy Treatment Patient Details Name: [name] MRN: [id number] DOB: 1940/04/21 Today's Date: 08/08/2021   History of Present Illness 81 y.o. F admitted on 08/05/21 due to increased weakness. PMH significant for Afib on DOAC, recurrent GI bleed, DM2, Dementia, Obese, Gastroparesis, Combined CHF, ESRD on HD, Hx of GI cancer with hemicolectomy.    PT Comments    Patient required max assist to stand from bed briefly. She is still profoundly weak. Unable to safely perform stand or squat pivot transfer at this time - discussed concerns with family. Little control of RLE. They very much would like her to go home and continue dialysis. Discussed possibility of using a sliding board to get in/out of a vehicle with max assistance however in current state this would still be a significant challenge for family to safely perform. Will continue to follow and progress as tolerated. Patient will continue to benefit from skilled physical therapy services to further improve independence with functional mobility.    Recommendations for follow up therapy are one component of a multi-disciplinary discharge planning process, led by the attending physician.  Recommendations may be updated based on patient status, additional functional criteria and insurance authorization.  Follow Up Recommendations  Skilled nursing-short term rehab (<3 hours/day) (vs. HHPT should family be able to provide necessary assist) Can patient physically be transported by private vehicle: No   Assistance Recommended at Discharge Frequent or constant Supervision/Assistance  Patient can return home with the following Two people to help with walking and/or transfers;Two people to help with bathing/dressing/bathroom;Assistance with cooking/housework;Help with stairs or ramp for entrance;Assist for transportation   Equipment Recommendations  Other (comment) (would benefit from stedy for transfers)    Recommendations for Other  Services       Precautions / Restrictions Precautions Precautions: Fall Restrictions Weight Bearing Restrictions: No     Mobility  Bed Mobility Overal bed mobility: Needs Assistance Bed Mobility: Supine to Sit, Sit to Supine     Supine to sit: Mod assist, HOB elevated Sit to supine: Mod assist   General bed mobility comments: Mod assist for bed mobility including rolling, supine<>sit transitions. Cues for rail use as able to pull, requiries assist for RLE>LLE and heavy trunk support.    Transfers Overall transfer level: Needs assistance Equipment used: Rolling walker (2 wheels) Transfers: Sit to/from Stand Sit to Stand: Max assist, From elevated surface           General transfer comment: Max assist for boost to stand, tolerated approx 15 seconds today but LEs fatigue. Attempted moving feet (RLE creeps inward,) was unable to increase BOS, easily tired. After period of rest attempted additional stand with intent to pivot to chair to simulate car transfer from w/c however pt did not have enough strength to stand upright a second time.    Ambulation/Gait                   Stairs             Wheelchair Mobility    Modified Rankin (Stroke Patients Only)       Balance Overall balance assessment: Needs assistance Sitting-balance support: Bilateral upper extremity supported, Feet unsupported Sitting balance-Leahy Scale: Fair     Standing balance support: Bilateral upper extremity supported Standing balance-Leahy Scale: Poor Standing balance comment: Reuires hands on assist to maintain upright posture.  Cognition Arousal/Alertness: Awake/alert Behavior During Therapy: WFL for tasks assessed/performed Overall Cognitive Status: Impaired/Different from baseline Area of Impairment: Problem solving                             Problem Solving: Slow processing, Requires verbal cues          Exercises       General Comments General comments (skin integrity, edema, etc.): VSS. Long discussion with family, daughter primarily. Would like to take pt home if she is able to transfer to car. In current state pt does not have strength for s/p transfer. Discussed alternative transfer with sliding board but even this would be incredibly difficult for family to assist with into a vehicle (limited room with door for family to fit and assist.) Pt does not show adequate UE strength to slide but does have good trunk control.      Pertinent Vitals/Pain Pain Assessment Pain Assessment: 0-10 Pain Score: 4  Pain Location: generalized, also back Pain Descriptors / Indicators: Aching Pain Intervention(s): Monitored during session, Repositioned    Home Living                          Prior Function            PT Goals (current goals can now be found in the care plan section) Acute Rehab PT Goals Patient Stated Goal: to go home PT Goal Formulation: With patient/family Time For Goal Achievement: 08/20/21 Potential to Achieve Goals: Good Progress towards PT goals: Progressing toward goals    Frequency    Min 3X/week      PT Plan Current plan remains appropriate    Co-evaluation              AM-PAC PT "6 Clicks" Mobility   Outcome Measure  Help needed turning from your back to your side while in a flat bed without using bedrails?: A Lot Help needed moving from lying on your back to sitting on the side of a flat bed without using bedrails?: A Lot Help needed moving to and from a bed to a chair (including a wheelchair)?: Total Help needed standing up from a chair using your arms (e.g., wheelchair or bedside chair)?: Total Help needed to walk in hospital room?: Total Help needed climbing 3-5 steps with a railing? : Total 6 Click Score: 8    End of Session Equipment Utilized During Treatment: Gait belt Activity Tolerance: Patient tolerated treatment well Patient left: in  bed;with call bell/phone within reach;with family/visitor present;with bed alarm set   PT Visit Diagnosis: Unsteadiness on feet (R26.81);Muscle weakness (generalized) (M62.81);Difficulty in walking, not elsewhere classified (R26.2)     Time: 2202-5427 PT Time Calculation (min) (ACUTE ONLY): 37 min  Charges:                        Candie Mile, PT    Ellouise Newer 08/08/2021, 3:29 PM

## 2021-08-08 NOTE — Consult Note (Signed)
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 Peristomal assessment: erythematous irritated from stool and blood being on the skin.  Treatment options for stomal/peristomal skin: Crusting with no sting barrier wipes and stoma powder Output; mushy brown with blood mixed in  Ostomy pouching: 2pc. 2 1/4 inch HOP pouch Kellie Simmering # [phone]) skin barrier Kellie Simmering # [phone]) Barrier rings Kellie Simmering # [phone]) Stoma powder Kellie Simmering # 6) No sting barrier wipes Kellie Simmering # [phone]) Education provided: None not a new pouch Enrolled patient in [facility] program: No  Second pouch change today just to assess bleeding around the stoma. Ordered silver nitrate in case it was needed. Pouch change successful without any bleeding. Used skin barrier wipes, skin barrier ring around the stoma and pouch replaced. 1 box of pouches in the room with barrier rings, stoma powder, and skin barrier wipes. Will get secretary to order skin barriers to go with pouches.  Will follow up on Monday.  Cathlean Marseilles Tamala Julian, MSN, RN, [place], Lysle Pearl, [facility] Wound Treatment Associate Pager [phone]

## 2021-08-08 NOTE — Progress Notes (Signed)
LUE venous duplex has been completed.   Results can be found under chart review under CV PROC. 08/08/2021 1:44 PM Jovana Rembold RVT, RDMS

## 2021-08-08 NOTE — Plan of Care (Signed)
  Problem: Education: Goal: Knowledge of General Education information will improve Description: Including pain rating scale, medication(s)/side effects and non-pharmacologic comfort measures Outcome: Progressing   Problem: Health Behavior/Discharge Planning: Goal: Ability to manage health-related needs will improve Outcome: Progressing   Problem: Activity: Goal: Risk for activity intolerance will decrease Outcome: Progressing   

## 2021-08-08 NOTE — Progress Notes (Signed)
This chaplain responded to Dr. Saul Fordyce consult for spiritual care. The chaplain understands the consult is for creating/updating the Pt. HCPOA and support.   The Pt. daughter-Marsha, other family members, and two members of the medical team are present at the Pt. bedside. Options for continuing to pursue the Pt. HCPOA were discussed with the Pt. The Pt. and [name] decided to independently review the paper work with spiritual care F/U on Monday. The document was left at the Pt. bedside.  This chaplain is available for F/U spiritual care as needed.  Chaplain Sallyanne Kuster [phone]

## 2021-08-08 NOTE — Progress Notes (Signed)
Daily Progress Note Intern Pager: [phone]  Patient name: [name] Medical record number: [id number] Date of birth: 1940/07/06 Age: 81 y.o. Gender: female  Primary Care Provider: [name], MD Consultants: Nephrology, gastroenterology, wound care Code Status: DNR  Pt Overview and Major Events to Date:  7/11-admitted, held Eliquis  Assessment and Plan: Mahitha L. Lightcap is a 81 year old female with pertinent medical history of Lynch syndrome, colon cancer s/p right hemicolectomy, small bowel AVM, renal cancer now ESRD on dialysis, who presented with weakness increased from baseline most likely secondary to deconditioning and acute on chronic anemia, exacerbated by fluid imbalance D/T missed dialysis session.  * Weakness Patient presented with acute change in strength and ambulation. On exam, no focal deficits, strength 5/5 in extremities, unable to test ambulation. Likely due to fluid imbalance from missed dialysis session versus acute on chronic anemia. -PT/OT -Fall precautions -Delirium precautions -Tylenol as needed for pain  Acute on chronic anemia Patient presented with increased fatigue and weakness. Hg 8.1 on admission, 7.7 on 08/04/2021, baseline around 9.3.  MCV normocytic at 83.7.  Fecal occult blood test positive.  Concern for increased GI bleed versus worsening renal anemia. On Eliquis for A-fib. Transfusion threshold <8.  S/p 1 transfusion and IV iron AM Hgb 8.3.  -GI consulted, recs appreciated  -  Can check CBC as outpatient periodically with hemodialysis sessions with plan for RBC transfusion or IV iron prn   - Patient would prefer conservative measures  -Hold Eliquis, would like to restart on discharge, discussed risk versus benefits -Cauterize bleeding vessels on skin edge around stoma (wound care)  ESRD (end stage renal disease) on dialysis ([place]) [facility]THS dialysis schedule. Patient presented today after missed dialysis session on 08/02/21 -Consult  Nephrology for dialysis, appreciate recs, dialysis today -RFP daily -Strict I/O -Daily weights -Continue home Renvela  Elevated liver transaminase level AST elevated to 65. Alk Phos 217, total bili normal. Patient has no complaint of stomach pain.  No signs of jaundice on exam.  Denies alcohol use.  Hepatitis B surface antigen and hepatitis B S antibody nonreactive on 06/24/2021.  Patient on amiodarone at home which may be contributing.  -Alk phos 211, AST 49, ALT within normal limits (stable from before) -Continue amiodarone, benefit outweighs risk  A-fib [facility]) Patient asymptomatic.  On exam and on telemetry patient in A-fib. - Currently holding home Eliquis in the setting of GI bleed, patient prefers to restart though there is risk of increased bleeding, requiring transfusions. - Continue home amiodarone  Hypotension As BP on admission 90s-110s, patient on home midodrine.  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  Less likely sepsis given repeat lactic acid decreased from 3.4-2.4. BCx NGTD.  AM cortisol 6.5, ACTH stim test suggests adrenal suppression is minimal as 60 minutes post stimulation was 25, rise from baseline cortisol was around 7. -D/C cefepime given stable -Continue home midodrine- 10 mg TID  -Monitor vitals, try to maintain MAP >60  Skin ulcer of abdominal wall (HCC) On LLQ. 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 Was started on Prednisone burst, scheduled to finish 7/14 (but has missed two doses thus far). - Wound care consult, appreciate recommendations -Hold Prednisone in the setting of hypotension on admission and low cortisol   Hypothyroidism Last last TSH from 02/16/2021 0.756. - Continue home levothyroxine  Swelling of upper arm Patient  has had bruising and swelling of left  upper arm.  Has not improved with dialysis.  Has had to pull out IVs around this location. - VAS ultrasound of upper arm  Insomnia and Anxiety  Patient takes 0.5 to 1 mg Klonopin nightly for sleep. - Continue Klonopin given low dose and palliative measure  Diabetic gastroparesis (HCC) - Continue home Reglan - Continue home Pepcid   FEN/GI: Dysphagia 3 PPx: Deferred in the setting of bleed and anemia  Dispo: Patient and family deciding between SNF and Home due to concerns regarding transportation to and from dialysis   Subjective:  Patient states her left arm is painful. She denies CP, Palpitations, N/V. Patient and family feel that there has been some miscommunication regarding the plan.   Objective: Temp:  [97.3 F (36.3 C)-97.8 F (36.6 C)] 97.7 F (36.5 C) (07/14 0716) Pulse Rate:  [54-61] 60 (07/14 0716) Resp:  [13-22] 22 (07/14 0427) BP: (84-111)/(25-59) 111/56 (07/14 0716) SpO2:  [100 %] 100 % (07/14 0716) Weight:  [91.3 kg-93 kg] 93 kg (07/13 2021) Physical Exam: General: Ill appearing, laying in bed sleeping with mouth open  Cardiovascular: RRR, faint radial pulses  Respiratory: No increased work of breathing on 3L, Not much air movement auscultated Abdomen: Right lower quadrant wound covered with dressing, soft, nontender, non distended Extremities: L arm 1+ pitting edema, bruising demarcated 2 in above wrist   Laboratory: Most recent CBC Lab Results  Component Value Date   WBC 10.5 08/08/2021   HGB 8.3 (L) 08/08/2021   HCT 28.2 (L) 08/08/2021   MCV 84.2 08/08/2021   PLT 170 08/08/2021   Most recent BMP    Latest Ref Rng & Units 08/07/2021    5:07 AM  BMP  Glucose 70 - 99 mg/dL 156   BUN 8 - 23 mg/dL 46   Creatinine 0.44 - 1.00 mg/dL 7.11   Sodium 135 - 145 mmol/L 137   Potassium 3.5 - 5.1 mmol/L 4.4   Chloride 98 - 111 mmol/L 101   CO2 22 - 32 mmol/L 25   Calcium 8.9 - 10.3 mg/dL 8.4     Other pertinent labs alk phos 211, AST 49, ALT  33   Lowry Ram, MD 08/08/2021, 1:30 PM  PGY-1, [place] Intern pager: [phone], text pages welcome Secure chat group [place]

## 2021-08-08 NOTE — Progress Notes (Signed)
    RE:  [name]       Date of Birth:  November 12, 2040     Date:   08/08/21       To Whom It May Concern:  Please be advised that the above-named patient will require a short-term nursing home stay - anticipated 30 days or less for rehabilitation and strengthening.  The plan is for return home.                 MD signature                Date

## 2021-08-08 NOTE — TOC Progression Note (Signed)
Transition of Care [facility]) - Progression Note    Patient Details  Name: ARRETTA TOENJES MRN: [id number] Date of Birth: 10/07/1940  Transition of Care [facility]) CM/SW Contact  Sharin Mons, RN Phone Number: 08/08/2021, 1:24 PM  Clinical Narrative:    NCM spoke with pt and pt's daughter [name] @ bedside regarding d/c plan. Daughter states would like for pt to d/c to home however not sure if family could managed getting pt to HD sessions 2/2 pt's debilitated condition. States would like to see how functional pt is with PT. States PTA could take a few steps with transferring to w/c. Now pt saying can't walk. Pt ok with SNF workup however preference is to transition to home with family. PT session pending .... TOC team will continue to monitor and  assist with needs...   Expected Discharge Plan: [place] (vs home with home health services) Barriers to Discharge: Continued Medical Work up  Expected Discharge Plan and Services Expected Discharge Plan: [place] (vs home with home health services) In-house Referral: Clinical Social Work   Post Acute Care Choice: [place] arrangements for the past 2 months: Single Family Home                                       Social Determinants of Health (SDOH) Interventions    Readmission Risk Interventions     No data to display

## 2021-08-08 NOTE — Progress Notes (Signed)
[facility] Progress Note  Subjective: seen in room. No UF reported w/ HD yesterday.   Vitals:   08/07/21 2021 08/07/21 2153 08/08/21 0427 08/08/21 0716  BP: (!) 96/38 (!) 99/59 (!) 110/52 (!) 111/56  Pulse: (!) 59 (!) 59 61 60  Resp: 18 17 (!) 22   Temp: 97.8 F (36.6 C) 97.8 F (36.6 C)  97.7 F (36.5 C)  TempSrc: Oral Oral  Oral  SpO2: 100% 100% 100% 100%  Weight: 93 kg     Height:        Exam: Gen chronically ill appearing, frail, elderly female No jvd or bruits Chest clear bilat to bases RRR no MRG Abd soft ntnd no mass or ascites +bs Ext 1+ pretib edema Neuro is nonfocal    LIJ TDC intact        Home meds include - amiodarone, apixaban, clonazepam prn, famotidine, metoclopramide 5 qid, rena-vit, home O2, sevelamer carbonate 800 ac tid, synthroid, vits/ supps/ prns        OP HD: TTS [facility]  3.5h  400/ 1.5  88.5kg   2/2 bath  [facility] TDC  Hep 2700 - last HD 7/11, coming off 3-4 kg over - mircera 225 q2 last 6/29, last Hb 7.3 - calcitriol 0.25 mcg po tiw   CXR 7/11 - IMPRESSION: 1. New small bilateral pleural effusions. 2. New left basilar atelectasis/airspace disease. 3. Stable cardiomegaly.      Assessment/ Plan: Generalized weakness - anemia, hypotension, esrd, chronic debility +/- others. Per primary team.  Symptomatic anemia - sp prbc's x1 on 7/12. Hb 8.3 today. Per pmd. Addressing iron deficiency as below.  Debility/ FTT - long-term decline over the past few yrs Hypotension - stable BP's in 90s- 110s, on midodrine 10 tid Volume - +LE edema, up 3-5kg by wts. Unable to pull fluid yest due to low bps ESRD - on HD TTS. Next HD due tomorrow.  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 Tsat very low 5%. Per pharm assistance they were able to find an IV iron preparation (ferric gluconate) that she tolerated well at OP HD unit in the past few years. Giving IV fe load at 250 mg qd x 4 so that esa can work  properly.   MBD ckd - CCa and phos in range. Cont renagel as binder, cont po vdra w hd.  Atrial fib - takes amio, eliquis at home Hypothyroidism - per pmd    Rob Loel Betancur 08/08/2021, 9:17 AM   Recent Labs  Lab 08/06/21 0538 08/06/21 2244 08/07/21 0507 08/07/21 1153 08/08/21 0303  HGB 7.0*   < > 8.6* 8.6* 8.3*  ALBUMIN 2.2*  --  2.1*  --   --   CALCIUM 8.2*  --  8.4*  --   --   PHOS 5.3*  --   --   --   --   CREATININE 5.65*  --  7.11*  --   --   K 4.1  --  4.4  --   --    < > = values in this interval not displayed.    Recent Labs  Lab 08/06/21 0538  IRON 10*  TIBC 300    Inpatient medications:  sodium chloride   Intravenous Once   amiodarone  200 mg Oral Daily   calcitRIOL  0.25 mcg Oral Q T,Th,Sa-HD   Chlorhexidine Gluconate Cloth  6 each Topical Q0600   clobetasol cream  Topical BID   clonazePAM  1 mg Oral Once per day on Sun Mon Wed Fri   [START ON 08/09/2021] darbepoetin (ARANESP) injection - DIALYSIS  200 mcg Intravenous Q Sat-HD   famotidine  10 mg Oral BID   levothyroxine  175 mcg Oral Q0600   metoCLOPramide  5 mg Oral TID AC & HS   midodrine  10 mg Oral TID WC   multivitamin  1 tablet Oral QHS    ferric gluconate (FERRLECIT) IVPB 250 mg (08/07/21 1232)   acetaminophen **OR** acetaminophen, polyethylene glycol, silver nitrate applicators

## 2021-08-08 NOTE — Assessment & Plan Note (Signed)
Patient has had bruising and swelling of left upper arm.  Has not improved with dialysis.  Has had to pull out IVs around this location. - VAS ultrasound of upper arm

## 2021-08-09 DIAGNOSIS — R531 Weakness: Secondary | ICD-10-CM | POA: Diagnosis not present

## 2021-08-09 DIAGNOSIS — I132 Hypertensive heart and chronic kidney disease with heart failure and with stage 5 chronic kidney disease, or end stage renal disease: Secondary | ICD-10-CM | POA: Diagnosis not present

## 2021-08-09 DIAGNOSIS — Z992 Dependence on renal dialysis: Secondary | ICD-10-CM | POA: Diagnosis not present

## 2021-08-09 DIAGNOSIS — N186 End stage renal disease: Secondary | ICD-10-CM | POA: Diagnosis not present

## 2021-08-09 LAB — CBC
HCT: 29.5 % — ABNORMAL LOW (ref 36.0–46.0)
Hemoglobin: 8.6 g/dL — ABNORMAL LOW (ref 12.0–15.0)
MCH: 24.6 pg — ABNORMAL LOW (ref 26.0–34.0)
MCHC: 29.2 g/dL — ABNORMAL LOW (ref 30.0–36.0)
MCV: 84.5 fL (ref 80.0–100.0)
Platelets: 167 10*3/uL (ref 150–400)
RBC: 3.49 MIL/uL — ABNORMAL LOW (ref 3.87–5.11)
RDW: 18.7 % — ABNORMAL HIGH (ref 11.5–15.5)
WBC: 8.8 10*3/uL (ref 4.0–10.5)
nRBC: 0 % (ref 0.0–0.2)

## 2021-08-09 LAB — RENAL FUNCTION PANEL
Albumin: 1.9 g/dL — ABNORMAL LOW (ref 3.5–5.0)
Anion gap: 11 (ref 5–15)
BUN: 28 mg/dL — ABNORMAL HIGH (ref 8–23)
CO2: 27 mmol/L (ref 22–32)
Calcium: 8.4 mg/dL — ABNORMAL LOW (ref 8.9–10.3)
Chloride: 99 mmol/L (ref 98–111)
Creatinine, Ser: 5.76 mg/dL — ABNORMAL HIGH (ref 0.44–1.00)
GFR, Estimated: 7 mL/min — ABNORMAL LOW (ref >60)
Glucose, Bld: 138 mg/dL — ABNORMAL HIGH (ref 70–99)
Phosphorus: 4.8 mg/dL — ABNORMAL HIGH (ref 2.5–4.6)
Potassium: 3.8 mmol/L (ref 3.5–5.1)
Sodium: 137 mmol/L (ref 135–145)

## 2021-08-09 MED ORDER — ALTEPLASE 2 MG IJ SOLR
2.0000 mg | Freq: Once | INTRAMUSCULAR | Status: AC
  Administered 2021-08-09: 2 mg

## 2021-08-09 MED ORDER — ALTEPLASE 2 MG IJ SOLR
INTRAMUSCULAR | Status: AC
  Filled 2021-08-09: qty 4

## 2021-08-09 MED ORDER — ACETAMINOPHEN 325 MG PO TABS
650.0000 mg | ORAL_TABLET | Freq: Four times a day (QID) | ORAL | Status: DC | PRN
  Administered 2021-08-12 (×2): 650 mg via ORAL
  Filled 2021-08-09 (×2): qty 2

## 2021-08-09 MED ORDER — ACETAMINOPHEN 650 MG RE SUPP: 650.0000 mg | Freq: Four times a day (QID) | RECTAL | Status: DC | PRN

## 2021-08-09 MED ORDER — HEPARIN SODIUM (PORCINE) 1000 UNIT/ML DIALYSIS
1500.0000 [IU] | INTRAMUSCULAR | Status: DC | PRN
  Administered 2021-08-09: 2000 [IU] via INTRAVENOUS_CENTRAL
  Filled 2021-08-09 (×3): qty 2

## 2021-08-09 MED ORDER — HEPARIN SODIUM (PORCINE) 1000 UNIT/ML IJ SOLN
INTRAMUSCULAR | Status: AC
  Filled 2021-08-09: qty 3

## 2021-08-09 NOTE — TOC Progression Note (Signed)
Transition of Care [facility]) - Progression Note    Patient Details  Name: [name] MRN: [id number] Date of Birth: 1940/02/07  [facility] CM/SW Contact  Leandro Berkowitz [place], [place] Phone Number: 08/09/2021, 12:50 PM  Clinical Narrative:     Met with patient and family at bedside to discuss Post Acute Care Choice. Plan has not been confirmed at this time. [name] provided with bed offers. Medicare.gov star rating list provided. [name] would like to review list before decision is made. She is also considering taking patient home if she can confirm that transportation to dialysis in [place] could be arranged. Per patient's daughter, she has Medicaid and [employer].   Transition of Care to continue to follow  [place], LCSW Transition of Care    Expected Discharge Plan: [place] (vs home with home health services) Barriers to Discharge: Continued Medical Work up  Expected Discharge Plan and Services Expected Discharge Plan: [place] (vs home with home health services) In-house Referral: Clinical Social Work   Post Acute Care Choice: [employer] arrangements for the past 2 months: Single Family Home                                       Social Determinants of Health (SDOH) Interventions    Readmission Risk Interventions     No data to display

## 2021-08-09 NOTE — Progress Notes (Signed)
Dr Jonnie Finner in department, this rn informed md pt given alteplase intra HD catheter d/t high pressure alarms

## 2021-08-09 NOTE — Progress Notes (Signed)
[facility] Progress Note  Subjective: seen in room. No c/o.   Vitals:   08/08/21 2040 08/08/21 2300 08/09/21 0300 08/09/21 0748  BP: (!) 123/44 (!) 123/44 (!) 108/53 (!) 107/40  Pulse: 60  61 62  Resp: 18  16   Temp:  (!) 97.5 F (36.4 C) (!) 97.5 F (36.4 C) 97.6 F (36.4 C)  TempSrc: Oral Oral Oral Oral  SpO2: 100%  100% 100%  Weight:      Height:        Exam: Gen chronically ill appearing, frail, elderly female No jvd or bruits Chest clear bilat to bases RRR no MRG Abd soft ntnd no mass or ascites +bs Ext 1+ pretib edema Neuro is nonfocal    LIJ TDC intact   Home meds include - amiodarone, apixaban, clonazepam prn, famotidine, metoclopramide 5 qid, rena-vit, home O2, sevelamer carbonate 800 ac tid, synthroid, vits/ supps/ prns    OP HD: TTS [facility]  3.5h  400/ 1.5  88.5kg   2/2 bath  [facility] TDC  Hep 2700 - last HD 7/11, coming off 3-4 kg over - mircera 225 q2 last 6/29, last Hb 7.3 - calcitriol 0.25 mcg po tiw   CXR 7/11 - IMPRESSION: 1. New small bilateral pleural effusions. 2. New left basilar atelectasis/airspace disease. 3. Stable cardiomegaly.      Assessment/ Plan: Generalized weakness - anemia, hypotension, esrd, chronic debility +/- other. Per primary team.  Symptomatic anemia - sp prbc's x1 on 7/12. Last Hb 8.6. Per pmd. Addressing iron deficiency as below.  Debility/ FTT - w/ steady slow decline over the past 1-2 yrs Hypotension - stable BP's in 90s- 110s, on midodrine 10 tid Volume - +LE edema, up 4 kg, unable to pull fluid w/ last hd here d/t low bps ESRD - on HD TTS. HD today on schedule.  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 Tsat very low 5%. Pharmacy kindly was able to find an IV iron preparation (ferric gluconate) that she tolerated well at OP HD unit sometime in the last couple of yrs. Giving IV fe load at 250 mg qd x 4 and tolerating well.   MBD ckd - CCa and phos in range. Cont renagel as binder, cont po  vdra w hd.  Atrial fib - takes amio, eliquis at home. Per pmd.  Hypothyroidism - per pmd    Rob Huber [place] 08/09/2021, 9:41 AM   Recent Labs  Lab 08/06/21 0538 08/06/21 2244 08/07/21 0507 08/07/21 1153 08/08/21 0303 08/09/21 0147 08/09/21 0644  HGB 7.0*   < > 8.6*   < > 8.3* 8.6*  --   ALBUMIN 2.2*  --  2.1*  --   --   --  1.9*  CALCIUM 8.2*  --  8.4*  --   --   --  8.4*  PHOS 5.3*  --   --   --   --   --  4.8*  CREATININE 5.65*  --  7.11*  --   --   --  5.76*  K 4.1  --  4.4  --   --   --  3.8   < > = values in this interval not displayed.    Recent Labs  Lab 08/06/21 0538  IRON 10*  TIBC 300    Inpatient medications:  sodium chloride   Intravenous Once   amiodarone  200 mg Oral Daily   calcitRIOL  0.25 mcg Oral Q T,Th,Sa-HD  Chlorhexidine Gluconate Cloth  6 each Topical Q0600   Chlorhexidine Gluconate Cloth  6 each Topical Q0600   clobetasol cream   Topical BID   clonazePAM  1 mg Oral Once per day on Sun Mon Wed Fri   darbepoetin (ARANESP) injection - DIALYSIS  200 mcg Intravenous Q Sat-HD   famotidine  10 mg Oral BID   levothyroxine  175 mcg Oral Q0600   metoCLOPramide  5 mg Oral TID AC & HS   midodrine  10 mg Oral TID WC   multivitamin  1 tablet Oral QHS    ferric gluconate (FERRLECIT) IVPB Stopped (08/08/21 1328)   acetaminophen **OR** acetaminophen, [START ON 08/10/2021] heparin, polyethylene glycol, silver nitrate applicators

## 2021-08-09 NOTE — Assessment & Plan Note (Signed)
Please see picture from admission. -Wound care consult placed, recs appreciated

## 2021-08-09 NOTE — Progress Notes (Addendum)
Received patient in bed, alert and oriented. Informed consent signed and in chart.  Time tx completed: 2 hours and 39 minutes  Pt wanting to end treatment early, pt then began to report chest pain, chest pain increases to palpation,  this rn stopped UF removal with no improvement in chest pain, pt signed ama form to end treatment early.Cardiac monitor reviewed, no events noted. This rn notified Dr Jonnie Finner of events. Ekg ordered at md verbal order. HD catheter treated with alteplase 2 mg each hd port d/t high a/v pressure alarms and slow flush/blood return arterial port during HD session.  Patient transported back to the room, alert and orient and in no acute distress. Report given to bedside RN.  Total UF removed: 900  Medication given: alteplase, midodrine, aranesp  Post HD VS: BP 100/42 HR 54 RR 15 Sat 100% on 3 liters nasal cannula Temp 98.3 oral   Post HD weight: 91.3 kg

## 2021-08-09 NOTE — TOC Progression Note (Addendum)
Transition of Care [facility]) - Progression Note    Patient Details  Name: [name] MRN: [id number] Date of Birth: 1940/11/01  Transition of Care [facility]) CM/SW Contact  Tory Mckissack [place], [place] Phone Number: 08/09/2021, 5:01 PM  Clinical Narrative:    Phone call received from patient's daughter stating that they have reviewed the bed offers and have decided to take patient home with home health. Per patient's daughter, she has multiple family members that will assist with patient's care. Contact number for [facility] provided to assist with transportation to Dialysis.  5:10pm [name] contacted [facility] who stated that she would have to contact [facility] initially, they contact with [facility] and are agreeable to coordinating transport to dialysis.  Transition of Care to continue to follow  [place], LCSW Transition of Care     Expected Discharge Plan: [place] (vs home with home health services) Barriers to Discharge: Continued Medical Work up  Expected Discharge Plan and Services Expected Discharge Plan: [place] (vs home with home health services) In-house Referral: Clinical Social Work   Post Acute Care Choice: [place] arrangements for the past 2 months: Single Family Home                                       Social Determinants of Health (SDOH) Interventions    Readmission Risk Interventions     No data to display

## 2021-08-09 NOTE — Plan of Care (Signed)
  Problem: Education: Goal: Knowledge of General Education information will improve Description: Including pain rating scale, medication(s)/side effects and non-pharmacologic comfort measures Outcome: Progressing   Problem: Health Behavior/Discharge Planning: Goal: Ability to manage health-related needs will improve Outcome: Progressing   Problem: Coping: Goal: Level of anxiety will decrease Outcome: Progressing   Problem: Skin Integrity: Goal: Risk for impaired skin integrity will decrease Outcome: Progressing   

## 2021-08-09 NOTE — Progress Notes (Addendum)
Daily Progress Note Intern Pager: [phone]  Patient name: [name] Medical record number: [id number] Date of birth: [birth year] Age: 81 y.o. Gender: female  Primary Care Provider: [name], MD Consultants: Nephrology, Gastroenterology, Wound Care Code Status: DNR  Pt Overview and Major Events to Date:  [name] is a 81 year old female with pertinent medical history of Lynch syndrome, colon cancer s/p right hemicolectomy, small bowel AVM, renal cancer now ESRD on dialysis, who presented with weakness increased from baseline most likely secondary to deconditioning and acute on chronic anemia, exacerbated by fluid imbalance D/T missed dialysis session.  Assessment and Plan:  * Acute on chronic anemia Cause is AVMs, found on prior endoscopies, in setting of Apixaban.  - Transfusion threshold <8.  S/p 1 unit PRBC (7/12) and IV iron (7/13).  -GI consulted, recs appreciated, no interventions planned. Plan for outpatient IV iron and transfusions as needed.  - Hold Apixaban, will discuss timing of restarting, reached out to Cardiology - If stoma bleeds, will apply silver nitrate   ESRD (end stage renal disease) on dialysis ([place]) TTHS dialysis schedule. Patient presented today after missed dialysis session on 08/02/21 -Consult Nephrology for dialysis, appreciate recs  -RFP daily -Strict I/O -Daily weights -Continue home Renvela  Elevated liver transaminase level - Downtrending, negative viral testing, repeat as outpatient   Weakness Slightly improved, due to anemia, ESRD with volume overload on admission, gastroparesis and multiple comorbid conditions  -PT/OT recommend SNF -Fall precautions -Delirium precautions -Tylenol as needed for pain  A-fib [facility]) Patient asymptomatic.  On exam and on telemetry patient in A-fib. - Currently holding home Eliquis in the setting of GI bleed, patient prefers to restart though there is risk of increased bleeding, requiring  transfusions. - Continue home amiodarone  Hypotension - Present on admission, has history of chronic hypotension - Evaluation for infection and adrenal insufficiency negative - Monitor, MAP >60 - Continue midodrine 10 mg TID  Skin ulcer of abdominal wall (HCC) On LLQ. 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 Was started on Prednisone burst, scheduled to finish 7/14 (but has missed two doses thus far). - Wound care consult, appreciate recommendations - Steroid cream ordered   Hypothyroidism Last last TSH from 02/16/2021 0.756. - Continue home levothyroxine  Swelling of upper arm -Due to Ivs. Negative ultrasound for thrombosis.   Insomnia and Anxiety  Patient takes 0.5 to 1 mg Klonopin nightly for sleep. - Continue Klonopin given low dose and palliative measure  Pressure injury of sacral region, stage 1 Please see picture from admission. -Wound care consult placed, recs appreciated  Diabetic gastroparesis ([place]) - Continue home Reglan - Continue home Pepcid      FEN/GI: Fluid, thin PPx: Deferred for bleed risk and anemia Dispo: Patient and family deciding between SNF and Home Health  Subjective:  Talked with Dr. Adah Salvage yesterday.  Objective: Temp:  [97.5 F (36.4 C)-97.6 F (36.4 C)] 97.6 F (36.4 C) (07/15 0748) Pulse Rate:  [59-62] 62 (07/15 0748) Resp:  [16-18] 16 (07/15 0300) BP: (92-123)/(40-54) 107/40 (07/15 0748) SpO2:  [100 %] 100 % (07/15 0748) Physical Exam: General: NAD, lying in hospital bed Neuro: A&O Cardiovascular: RRR, no murmurs, 1+ peripheral edema bilateral lower extremities up to knee Respiratory: normal WOB on 3L [place], CTAB, no wheezes, ronchi or rales Abdomen: soft, NTTP, no rebound or guarding Extremities: Moving all 4 extremities equally   Laboratory: Most recent CBC Lab Results  Component Value Date  WBC 8.8 08/09/2021   HGB 8.6 (L) 08/09/2021   HCT 29.5 (L) 08/09/2021   MCV  84.5 08/09/2021   PLT 167 08/09/2021   Most recent BMP    Latest Ref Rng & Units 08/09/2021    6:44 AM  BMP  Glucose 70 - 99 mg/dL 138   BUN 8 - 23 mg/dL 28   Creatinine 0.44 - 1.00 mg/dL 5.76   Sodium 135 - 145 mmol/L 137   Potassium 3.5 - 5.1 mmol/L 3.8   Chloride 98 - 111 mmol/L 99   CO2 22 - 32 mmol/L 27   Calcium 8.9 - 10.3 mg/dL 8.4     Other pertinent labs none   Imaging/Diagnostic Tests:  LUE venous duplex negative for DVT  Salvadore Oxford, MD 08/09/2021, 8:04 AM  PGY-1, [place] Intern pager: [phone], text pages welcome Secure chat group [place]

## 2021-08-10 DIAGNOSIS — I480 Paroxysmal atrial fibrillation: Secondary | ICD-10-CM | POA: Diagnosis not present

## 2021-08-10 DIAGNOSIS — E1143 Type 2 diabetes mellitus with diabetic autonomic (poly)neuropathy: Secondary | ICD-10-CM

## 2021-08-10 DIAGNOSIS — D649 Anemia, unspecified: Secondary | ICD-10-CM | POA: Diagnosis not present

## 2021-08-10 DIAGNOSIS — K3184 Gastroparesis: Secondary | ICD-10-CM

## 2021-08-10 DIAGNOSIS — R627 Adult failure to thrive: Secondary | ICD-10-CM | POA: Diagnosis not present

## 2021-08-10 DIAGNOSIS — R531 Weakness: Secondary | ICD-10-CM | POA: Diagnosis not present

## 2021-08-10 DIAGNOSIS — I132 Hypertensive heart and chronic kidney disease with heart failure and with stage 5 chronic kidney disease, or end stage renal disease: Secondary | ICD-10-CM | POA: Diagnosis not present

## 2021-08-10 LAB — CULTURE, BLOOD (ROUTINE X 2)
Culture: NO GROWTH
Culture: NO GROWTH
Special Requests: ADEQUATE
Special Requests: ADEQUATE

## 2021-08-10 LAB — HEMOGLOBIN AND HEMATOCRIT, BLOOD
HCT: 29.1 % — ABNORMAL LOW (ref 36.0–46.0)
Hemoglobin: 8.5 g/dL — ABNORMAL LOW (ref 12.0–15.0)

## 2021-08-10 MED ORDER — SEVELAMER CARBONATE 800 MG PO TABS: 800.0000 mg | ORAL_TABLET | Freq: Three times a day (TID) | ORAL | Status: DC

## 2021-08-10 NOTE — Plan of Care (Signed)

## 2021-08-10 NOTE — Progress Notes (Signed)
Physical Therapy Treatment Patient Details Name: [name] MRN: [id number] DOB: [birth year] Today's Date: 08/10/2021   History of Present Illness 81 y.o. F admitted on 08/05/21 due to increased weakness. PMH significant for Afib on DOAC, recurrent GI bleed, DM2, Dementia, Obese, Gastroparesis, Combined CHF, ESRD on HD, Hx of GI cancer with hemicolectomy.    PT Comments    Session focused on transfer training to determine level of assist pt will require for wheelchair transfers to be transported to HD. Pt requiring two person mod to maximal assist for squat pivot vs slide board transfers. Due to high level of physical assist required, recommend hoyer lift at this time for out of bed mobility upon discharge home. Pt/pt family own hoyer lift, but state pt has been fearful of attempting it. Will continue to follow acutely.    Recommendations for follow up therapy are one component of a multi-disciplinary discharge planning process, led by the attending physician.  Recommendations may be updated based on patient status, additional functional criteria and insurance authorization.  Follow Up Recommendations  Skilled nursing-short term rehab (<3 hours/day) (pt family with preference for home; recommend HHPT if declines SNF)  Can patient physically be transported by private vehicle: No   Assistance Recommended at Discharge Frequent or constant Supervision/Assistance  Patient can return home with the following Two people to help with walking and/or transfers;Two people to help with bathing/dressing/bathroom;Assistance with cooking/housework;Help with stairs or ramp for entrance;Assist for transportation   Equipment Recommendations  Other (comment) (pt equipped)    Recommendations for Other Services       Precautions / Restrictions Precautions Precautions: Fall Restrictions Weight Bearing Restrictions: No     Mobility  Bed Mobility Overal bed mobility: Needs Assistance Bed Mobility:  Supine to Sit     Supine to sit: Mod assist, HOB elevated     General bed mobility comments: Cues for use of bed rail, assist for LLE, use of bed pad to scoot hips forward to edge of bed    Transfers Overall transfer level: Needs assistance Equipment used: None Transfers: Bed to chair/wheelchair/BSC       Squat pivot transfers: Mod assist, +2 physical assistance    Lateral/Scoot Transfers: Max assist, +2 physical assistance, With slide board General transfer comment: Pt requiring modA + 2 to squat pivot from [facility] <> bed and then maxA + 2 for slide board transfer from bed > chair. Pt with difficulty moving/advancing LLE    Ambulation/Gait               General Gait Details: unable   Stairs             Wheelchair Mobility    Modified Rankin (Stroke Patients Only)       Balance Overall balance assessment: Needs assistance Sitting-balance support: Bilateral upper extremity supported, Feet unsupported Sitting balance-Leahy Scale: Fair                                      Cognition Arousal/Alertness: Awake/alert Behavior During Therapy: WFL for tasks assessed/performed Overall Cognitive Status: Impaired/Different from baseline Area of Impairment: Problem solving                             Problem Solving: Slow processing, Requires verbal cues          Exercises      General Comments  Pertinent Vitals/Pain Pain Assessment Pain Assessment: Faces Faces Pain Scale: Hurts a little bit Pain Location: generalized, also back Pain Descriptors / Indicators: Aching Pain Intervention(s): Monitored during session    Home Living                          Prior Function            PT Goals (current goals can now be found in the care plan section) Acute Rehab PT Goals Patient Stated Goal: to go home PT Goal Formulation: With patient/family Time For Goal Achievement: 08/20/21 Potential to Achieve Goals:  Fair    Frequency    Min 3X/week      PT Plan Current plan remains appropriate    Co-evaluation              AM-PAC PT "6 Clicks" Mobility   Outcome Measure  Help needed turning from your back to your side while in a flat bed without using bedrails?: A Lot Help needed moving from lying on your back to sitting on the side of a flat bed without using bedrails?: A Lot Help needed moving to and from a bed to a chair (including a wheelchair)?: Total Help needed standing up from a chair using your arms (e.g., wheelchair or bedside chair)?: Total Help needed to walk in hospital room?: Total Help needed climbing 3-5 steps with a railing? : Total 6 Click Score: 8    End of Session Equipment Utilized During Treatment: Gait belt Activity Tolerance: Patient tolerated treatment well Patient left: with call bell/phone within reach;with family/visitor present;in chair Nurse Communication: Mobility status;Need for lift equipment PT Visit Diagnosis: Unsteadiness on feet (R26.81);Muscle weakness (generalized) (M62.81);Difficulty in walking, not elsewhere classified (R26.2)     Time: 6389-3734 PT Time Calculation (min) (ACUTE ONLY): 39 min  Charges:  $Therapeutic Activity: 38-52 mins                     Wyona Almas, PT, DPT Acute Rehabilitation Services Office [phone]    Deno Etienne 08/10/2021, 4:00 PM

## 2021-08-10 NOTE — Progress Notes (Addendum)
Subjective: Seen in room no current complaints /daughter present.  Noted plans for discharge home per daughter " 7 people at home to help out awaiting transportation arrangement to kidney center before DC'' HD yesterday on schedule off early secondary to "hurting all over"  Objective Vital signs in last 24 hours: Vitals:   08/09/21 1825 08/09/21 2233 08/10/21 0442 08/10/21 0721  BP: (!) 100/42 (!) 122/58 (!) 115/55 (!) 104/55  Pulse: (!) 54 (!) 59 63 62  Resp: 15 16    Temp: 98.3 F (36.8 C) 97.6 F (36.4 C) 98.3 F (36.8 C) 97.7 F (36.5 C)  TempSrc: Oral Oral Oral Oral  SpO2: 100% 100% 100% 100%  Weight: 91.3 kg     Height:       Weight change:   Physical Exam: General: Alert chronically ill obese elderly female NAD Heart: Rate irregular, rate 61 on monitor,, A-fib, no MRG Lungs: CTA bilaterally anteriorly nonlabored breathing Abdomen: NABS, NTND some abdominal wall edema but no ascites Extremities: Bilateral 1+ Pretib edema  dialysis Access: Left IJ TDC intact   Home meds include - amiodarone, apixaban, clonazepam prn, famotidine, metoclopramide 5 qid, rena-vit, home O2, sevelamer carbonate 800 ac tid, synthroid, vits/ supps/ prns    OP HD: TTS [facility]  3.5h  400/ 1.5  88.5kg   2/2 bath  [facility] TDC  Hep 2700 - last HD 7/11, coming off 3-4 kg over - mircera 225 q2 last 6/29, last Hb 7.3 - calcitriol 0.25 mcg po tiw   CXR 7/11 - IMPRESSION: 1. New small bilateral pleural effusions. 2. New left basilar atelectasis/airspace disease. 3. Stable cardiomegaly.    Problem/Plan: Generalized weakness multifactorial- anemia, hypotension, esrd, chronic debility +/- other. Per primary team.  Palliative to see today per admit Symptomatic anemia - sp prbc's x1 on 7/12. , am Hb 8.5<8.6 Per pmd. Addressing iron deficiency as below.  Debility/ FTT - w/ steady slow decline over the past 1-2 yrs family chooses to do home PT. Hypotension - stable BP's in 90s- 110s, on midodrine 10 tid Volume - +LE  edema, admit up 4 kg (per bed wt) UF 900 cc yesterday but only 2 hours 39 minutes HD time secondary to hurting all over.  Continue midodrine next HD 7/18 ESRD - on HD TTS. on schedule.  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 Tsat very low 5%. Pharmacy kindly was able to find an IV iron preparation (ferric gluconate) that she tolerated well at OP HD unit sometime in the last couple of yrs. Giving IV fe load at 250 mg qd x 4 and tolerating well.   MBD ckd - CCa and phos 4.8 in range. = per daughter not on binder , renvela home med listed  BUT has hx severe nausea noted on allergy listing thus  hold binder for now monitor phos lab // cont po vdra w hd.  Atrial fib - takes amio, eliquis at home. Per pmd.  Admit team holding Eliquis now with anemia Hypothyroidism - per pmd   Ernest Haber, PA-C [facility] [place] [phone] 08/10/2021,1:27 PM  LOS: 0 days   Labs: Basic Metabolic Panel: Recent Labs  Lab 08/06/21 0538 08/07/21 0507 08/09/21 0644  NA 136 137 137  K 4.1 4.4 3.8  CL 98 101 99  CO2 '26 25 27  '$ GLUCOSE 132* 156* 138*  BUN 35* 46* 28*  CREATININE 5.65* 7.11* 5.76*  CALCIUM 8.2* 8.4* 8.4*  PHOS  5.3*  --  4.8*   Liver Function Tests: Recent Labs  Lab 08/05/21 1641 08/06/21 0538 08/07/21 0507 08/09/21 0644  AST 65*  --  49*  --   ALT 33  --  33  --   ALKPHOS 217*  --  211*  --   BILITOT 0.8  --  0.8  --   PROT 6.0*  --  5.3*  --   ALBUMIN 2.5* 2.2* 2.1* 1.9*   No results for input(s): "LIPASE", "AMYLASE" in the last 168 hours. No results for input(s): "AMMONIA" in the last 168 hours. CBC: Recent Labs  Lab 08/05/21 1641 08/05/21 2025 08/06/21 0538 08/06/21 2244 08/07/21 0507 08/07/21 1153 08/08/21 0303 08/09/21 0147 08/10/21 0250  WBC 13.0*  --  10.4  --  9.0  --  10.5 8.8  --   NEUTROABS 10.8*  --   --   --   --   --   --   --   --   HGB 8.1*   < > 7.0*   < > 8.6*   < > 8.3* 8.6* 8.5*  HCT 28.8*   < >  25.2*   < > 29.1*   < > 28.2* 29.5* 29.1*  MCV 83.7  --  84.8  --  83.4  --  84.2 84.5  --   PLT 269  --  217  --  200  --  170 167  --    < > = values in this interval not displayed.   Cardiac Enzymes: No results for input(s): "CKTOTAL", "CKMB", "CKMBINDEX", "TROPONINI" in the last 168 hours. CBG: No results for input(s): "GLUCAP" in the last 168 hours.  Studies/Results: No results found. Medications:   sodium chloride   Intravenous Once   amiodarone  200 mg Oral Daily   calcitRIOL  0.25 mcg Oral Q T,Th,Sa-HD   Chlorhexidine Gluconate Cloth  6 each Topical Q0600   Chlorhexidine Gluconate Cloth  6 each Topical Q0600   clobetasol cream   Topical BID   clonazePAM  1 mg Oral Once per day on Sun Mon Wed Fri   darbepoetin (ARANESP) injection - DIALYSIS  200 mcg Intravenous Q Sat-HD   famotidine  10 mg Oral BID   levothyroxine  175 mcg Oral Q0600   metoCLOPramide  5 mg Oral TID AC & HS   midodrine  10 mg Oral TID WC   multivitamin  1 tablet Oral QHS

## 2021-08-10 NOTE — Progress Notes (Addendum)
Daily Progress Note Intern Pager: [phone]  Patient name: [name] Medical record number: [id number] Date of birth: Apr 20, 1940 Age: 81 y.o. Gender: female  Primary Care Provider: [name], MD Consultants: Nephrology Code Status: DNR   Pt Overview and Major Events to Date:  7/11-admitted, held Eliquis 7/13 - Dialysis  7/15 - Dialysis ended early due to chest pain, received altepase in fistula.  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  * Acute on chronic anemia Cause is AVMs, found on prior endoscopies, in setting of Apixaban.  - Transfusion threshold <8.  S/p 1 unit PRBC (7/12) and IV iron (7/13).  -GI consulted, recs appreciated, no interventions planned. Plan for outpatient IV iron and transfusions as needed.  - Hold Apixaban, continue holding for one week., Spoke to Dr. [name]. From cardiology.  - If stoma bleeds, will apply silver nitrate  - Nephrology started darbe on Thurs dialysis sessions   ESRD (end stage renal disease) on dialysis ([place]) TTHS dialysis schedule. Patient presented today after missed dialysis session on 08/02/21 -Consult Nephrology for dialysis, appreciate recs  -RFP daily -Strict I/O -Daily weights -Continue home Renvela  Elevated liver transaminase level - Downtrending, negative viral testing, repeat as outpatient   Weakness Slightly improved, due to anemia, ESRD with volume overload on admission, gastroparesis and multiple comorbid conditions  -PT/OT recommend SNF -Fall precautions -Delirium precautions -Tylenol as needed for pain  A-fib [facility]) Patient asymptomatic.  On exam and on telemetry  patient in A-fib. - Currently holding home Eliquis in the setting of GI bleed, patient prefers to restart though there is risk of increased bleeding, requiring transfusions. - Continue home amiodarone  Hypotension - Present on admission, has history of chronic hypotension - Evaluation for infection and adrenal insufficiency negative - Monitor, MAP >60 - Continue midodrine 10 mg TID  Skin ulcer of abdominal wall (HCC) On LLQ. 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 Was started on Prednisone burst, scheduled to finish 7/14 (but has missed two doses thus far). - Wound care consult, appreciate recommendations - Steroid cream ordered   Hypothyroidism Last last TSH from 02/16/2021 0.756. - Continue home levothyroxine  Swelling of upper arm Patient has had bruising and swelling of left upper arm.  Has not improved with dialysis.  Has had to pull out IVs around this location. VAS ultrasound of upper arm unremarkable.  - Continue to monitor   Insomnia and Anxiety  Patient takes 0.5 to 1 mg Klonopin nightly for sleep. - Continue Klonopin given low dose and palliative measure  Pressure injury of sacral region, stage 1 Please see picture from admission. -Wound care consult placed, recs appreciated  Diabetic gastroparesis ([place]) - Continue home Reglan - Continue home Pepcid    FEN/GI: Dysphagia 3  PPx: SCDs Dispo:HHPT, Family contacted [place] and [facility] regarding transportation to dialysis.    Subjective:  Patient says she feels well today. She is still frustrated that she cannot walk. Excited to work with physical therapy. She is upset they did not come yesterday.   Objective: Temp:  [97.4 F (36.3 C)-98.3 F (36.8 C)] 97.7 F (36.5 C) (07/16 0721) Pulse Rate:  [54-63] 62 (07/16 0721) Resp:  [  12-21] 16 (07/15 2233) BP: (81-136)/(30-73) 104/55 (07/16 0721) SpO2:  [100 %] 100 % (07/16 0721) Weight:  [91.3 kg-92.7 kg] 91.3 kg  (07/15 1825) Physical Exam: General: Chronically ill appearing, in no acute distress  Cardiovascular: Radial pulses equal bilaterally, well perfused, trace edema BUE, 1+ pitting edema BLE.  Respiratory: Breathing comfortably on 3 L Nasal cannula  Abdomen: Soft,  non distended, non tender to palpation, Stoma pink bag filled with brown stool, L lower quadrant abdominal wound dry covered with dressing    Laboratory: Most recent CBC Lab Results  Component Value Date   WBC 8.8 08/09/2021   HGB 8.5 (L) 08/10/2021   HCT 29.1 (L) 08/10/2021   MCV 84.5 08/09/2021   PLT 167 08/09/2021   Most recent BMP    Latest Ref Rng & Units 08/09/2021    6:44 AM  BMP  Glucose 70 - 99 mg/dL 138   BUN 8 - 23 mg/dL 28   Creatinine 0.44 - 1.00 mg/dL 5.76   Sodium 135 - 145 mmol/L 137   Potassium 3.5 - 5.1 mmol/L 3.8   Chloride 98 - 111 mmol/L 99   CO2 22 - 32 mmol/L 27   Calcium 8.9 - 10.3 mg/dL 8.4     Lowry Ram, MD 08/10/2021, 10:45 AM  PGY-1, [place] Intern pager: [phone], text pages welcome Secure chat group [place]

## 2021-08-11 DIAGNOSIS — I480 Paroxysmal atrial fibrillation: Secondary | ICD-10-CM | POA: Diagnosis not present

## 2021-08-11 DIAGNOSIS — N186 End stage renal disease: Secondary | ICD-10-CM | POA: Diagnosis not present

## 2021-08-11 DIAGNOSIS — I132 Hypertensive heart and chronic kidney disease with heart failure and with stage 5 chronic kidney disease, or end stage renal disease: Secondary | ICD-10-CM | POA: Diagnosis not present

## 2021-08-11 DIAGNOSIS — R627 Adult failure to thrive: Secondary | ICD-10-CM | POA: Diagnosis not present

## 2021-08-11 DIAGNOSIS — D649 Anemia, unspecified: Secondary | ICD-10-CM | POA: Diagnosis not present

## 2021-08-11 LAB — HEPATITIS C ANTIBODY: HCV Ab: NONREACTIVE

## 2021-08-11 LAB — HEPATITIS B SURFACE ANTIGEN: Hepatitis B Surface Ag: NONREACTIVE

## 2021-08-11 LAB — HEPATITIS B SURFACE ANTIBODY,QUALITATIVE: Hep B S Ab: NONREACTIVE

## 2021-08-11 LAB — HEPATITIS B CORE ANTIBODY, TOTAL: Hep B Core Total Ab: NONREACTIVE

## 2021-08-11 MED ORDER — LIDOCAINE-PRILOCAINE 2.5-2.5 % EX CREA
1.0000 | TOPICAL_CREAM | CUTANEOUS | Status: DC | PRN
  Filled 2021-08-11: qty 5

## 2021-08-11 MED ORDER — PENTAFLUOROPROP-TETRAFLUOROETH EX AERO: 1.0000 | INHALATION_SPRAY | CUTANEOUS | Status: DC | PRN

## 2021-08-11 MED ORDER — HEPARIN SODIUM (PORCINE) 1000 UNIT/ML DIALYSIS
1000.0000 [IU] | INTRAMUSCULAR | Status: DC | PRN
  Filled 2021-08-11 (×2): qty 1

## 2021-08-11 MED ORDER — ALTEPLASE 2 MG IJ SOLR
2.0000 mg | Freq: Once | INTRAMUSCULAR | Status: DC | PRN
  Filled 2021-08-11: qty 2

## 2021-08-11 MED ORDER — LIDOCAINE HCL (PF) 1 % IJ SOLN
5.0000 mL | INTRAMUSCULAR | Status: DC | PRN
  Filled 2021-08-11: qty 5

## 2021-08-11 NOTE — Plan of Care (Signed)

## 2021-08-11 NOTE — Progress Notes (Addendum)
Received a call from RN CM on Friday regarding pt's d/c plan. It was unknown at that time if pt would remain at [facility] for out-pt HD or if pt would need to be placed at new HD clinic. Message left for pt's daughter to inquire about plans regarding HD clinic at d/c. Pt receives out-pt HD at [facility] on TTS. Will assist as needed.   Melven Sartorius Renal Navigator [phone]  Addendum at 1:55 pm: Spoke to pt's daughter via phone. Daughter confirms that pt wants to remain at [facility] for out-pt HD and plans are for pt to continue HD there at d/c at this time.

## 2021-08-11 NOTE — Care Management Obs Status (Signed)
[place] NOTIFICATION   Patient Details  Name: [name] MRN: [id number] Date of Birth: 08-Dec-1940   Medicare Observation Status Notification Given:  Yes    Bartholomew Crews, RN 08/11/2021, 3:59 PM

## 2021-08-11 NOTE — Progress Notes (Signed)
[facility] Progress Note   Subjective:    Seen and examined patient at bedside. [name] also at bedside. Patient eating breakfast. No acute complaints. Denies SOB, CP, and N/V. Working on transportation arrangements to outpatient HD center prior to dc. Plan for HD 7/18 if patient is still here.  Objective Vitals:   08/10/21 1541 08/10/21 2028 08/11/21 0413 08/11/21 0846  BP: (!) 95/30 (!) 128/59 (!) 122/50 (!) 122/56  Pulse: (!) 58 64 64 63  Resp:  '19 19 18  '$ Temp: 97.8 F (36.6 C) 97.9 F (36.6 C) 98 F (36.7 C) (!) 97.5 F (36.4 C)  TempSrc: Oral Oral  Oral  SpO2: 100% 100% 99% 100%  Weight:      Height:       Physical Exam General: Alert chronically ill obese elderly female; NAD Heart: Rate irregular, A-fib, no MRG Lungs: CTA anteriorly and laterally; non-labored breathing Abdomen: NABS, NTND some abdominal wall edema but no ascites Extremities: Bilateral 1-2+ Pretib edema Dialysis Access: Left IJ TDC intact  Filed Weights   08/07/21 2021 08/09/21 1338 08/09/21 1825  Weight: 93 kg 92.7 kg 91.3 kg    Intake/Output Summary (Last 24 hours) at 08/11/2021 0946 Last data filed at 08/10/2021 1655 Gross per 24 hour  Intake 240 ml  Output 125 ml  Net 115 ml    Additional Objective Labs: Basic Metabolic Panel: Recent Labs  Lab 08/06/21 0538 08/07/21 0507 08/09/21 0644  NA 136 137 137  K 4.1 4.4 3.8  CL 98 101 99  CO2 '26 25 27  '$ GLUCOSE 132* 156* 138*  BUN 35* 46* 28*  CREATININE 5.65* 7.11* 5.76*  CALCIUM 8.2* 8.4* 8.4*  PHOS 5.3*  --  4.8*   Liver Function Tests: Recent Labs  Lab 08/05/21 1641 08/06/21 0538 08/07/21 0507 08/09/21 0644  AST 65*  --  49*  --   ALT 33  --  33  --   ALKPHOS 217*  --  211*  --   BILITOT 0.8  --  0.8  --   PROT 6.0*  --  5.3*  --   ALBUMIN 2.5* 2.2* 2.1* 1.9*   No results for input(s): "LIPASE", "AMYLASE" in the last 168 hours. CBC: Recent Labs  Lab 08/05/21 1641 08/05/21 2025 08/06/21 0538  08/06/21 2244 08/07/21 0507 08/07/21 1153 08/08/21 0303 08/09/21 0147 08/10/21 0250  WBC 13.0*  --  10.4  --  9.0  --  10.5 8.8  --   NEUTROABS 10.8*  --   --   --   --   --   --   --   --   HGB 8.1*   < > 7.0*   < > 8.6*   < > 8.3* 8.6* 8.5*  HCT 28.8*   < > 25.2*   < > 29.1*   < > 28.2* 29.5* 29.1*  MCV 83.7  --  84.8  --  83.4  --  84.2 84.5  --   PLT 269  --  217  --  200  --  170 167  --    < > = values in this interval not displayed.   Blood Culture    Component Value Date/Time   SDES BLOOD LEFT ARM 08/05/2021 2014   [place]  08/05/2021 2014    BOTTLES DRAWN AEROBIC AND ANAEROBIC Blood Culture adequate volume   CULT  08/05/2021 2014    NO GROWTH 5 DAYS Performed at [facility], [place] [address],  [place], [place] [address]    REPTSTATUS 08/10/2021 FINAL 08/05/2021 2014    Cardiac Enzymes: No results for input(s): "CKTOTAL", "CKMB", "CKMBINDEX", "TROPONINI" in the last 168 hours. CBG: No results for input(s): "GLUCAP" in the last 168 hours. Iron Studies: No results for input(s): "IRON", "TIBC", "TRANSFERRIN", "FERRITIN" in the last 72 hours. Lab Results  Component Value Date   INR 1.5 (H) 06/24/2021   INR 2.1 (H) 03/19/2021   INR 1.8 (H) 07/16/2020   Studies/Results: No results found.  Medications:   amiodarone  200 mg Oral Daily   calcitRIOL  0.25 mcg Oral Q T,Th,Sa-HD   Chlorhexidine Gluconate Cloth  6 each Topical Q0600   Chlorhexidine Gluconate Cloth  6 each Topical Q0600   clobetasol cream   Topical BID   clonazePAM  1 mg Oral Once per day on Sun Mon Wed Fri   darbepoetin (ARANESP) injection - DIALYSIS  200 mcg Intravenous Q Sat-HD   famotidine  10 mg Oral BID   levothyroxine  175 mcg Oral Q0600   metoCLOPramide  5 mg Oral TID AC & HS   midodrine  10 mg Oral TID WC   multivitamin  1 tablet Oral QHS    Dialysis Orders: TTS [facility]  3.5h  400/ 1.5  88.5kg   2/2 bath  [facility] TDC  Hep 2700 - last HD 7/11, coming off 3-4 kg over - mircera 225 q2  last 6/29, last Hb 7.3 - calcitriol 0.25 mcg po tiw  Home meds include - amiodarone, apixaban, clonazepam prn, famotidine, metoclopramide 5 qid, rena-vit, home O2, sevelamer carbonate 800 ac tid, synthroid, vits/ supps/ prns  CXR 7/11 - IMPRESSION: 1. New small bilateral pleural effusions. 2. New left basilar atelectasis/airspace disease. 3. Stable cardiomegaly.   Assessment/Plan: Generalized weakness multifactorial- anemia, hypotension, esrd, chronic debility +/- other. Per primary team.  Symptomatic anemia - sp prbc's x1 on 7/12. , am Hb now 8.5. Per pmd. Addressing iron deficiency as below.  Debility/ FTT - w/ steady slow decline over the past 1-2 yrs family chooses to do home PT and not rehab. Hypotension - stable BP's in 90s- 110s, on midodrine 10 tid Volume - +LE edema, admit up 4 kg (per bed wt) UF 900 cc yesterday but only 2 hours 39 minutes HD time secondary to hurting all over. Continue midodrine next HD 7/18 ESRD - on HD TTS. on schedule.  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 Tsat very low 5%. Pharmacy kindly was able to find an IV iron preparation (ferric gluconate) that she tolerated well at OP HD unit sometime in the last couple of yrs. Giving IV fe load at 250 mg qd x 4 and tolerating well.   MBD ckd - CCa and phos 4.8 in range. = per daughter not on binder , renvela home med listed  BUT has hx severe nausea noted on allergy listing thus  hold binder for now monitor phos lab // cont po vdra w hd.  Atrial fib - takes amio, eliquis at home. Per pmd.  Admit team holding Eliquis now with anemia Hypothyroidism - per pmd   Tobie Poet, NP [place] 08/11/2021,9:46 AM  LOS: 0 days

## 2021-08-11 NOTE — Progress Notes (Addendum)
This chaplain is present for F/U spiritual care and Advance Directive education:  HCPOA and Living Will. The Pt. daughter-Marsha is at the Pt. bedside. The Pt. is able to answer clarifying questions and articulate her choices in periods of wakefulness.  The chaplain understands the Pt. choice for HCPOA is [employer]. If the HCPOA is unable or unwilling to serve in this role, the Pt. next choice is [employer].  The Pt. filled out her Living Will indicating her choice of no life prolonging measures in the three documented life ending situations.  The Advance Directive was left in the Pt. room. The Pt. is waiting for an available notary and witnesses for notarizing of the Pt. Advance Directive.  This chaplain is available for F/U spiritual care as needed.  **[id number] The chaplain updated [name], notary is not available today.   Chaplain Sallyanne Kuster [phone]

## 2021-08-11 NOTE — TOC Progression Note (Addendum)
Transition of Care [facility]) - Progression Note    Patient Details  Name: [name] MRN: [id number] Date of Birth: Jan 27, 1940  Transition of Care [facility]) CM/SW Contact  Bartholomew Crews, RN Phone Number: [phone] 08/11/2021, 11:49 AM  Clinical Narrative:     Spoke with patient and her daughter, [name], at the bedside to discuss post acute transition. PTA patient was home with son who is available 24/7. For DME, she has wheelchair, oxygen, BSC, rollator, large hospital bed, and hoyer lift. She attends HD at [facility] in [place]. Family has been previously transporting patient to/from HD.   Patient is active with [facility] for [facility] services. [place] in agreement with maximizing HH. Patient will need HH Face to Face order for RN, PT, OT, Aide, SW.   Patient has Medicaid, and would benefit from activating PCS hours. Daughter and patient in agreement. [id number] completed and faxed to [employer] expedited line. Pending call to complete initial assessment.   Patient and daughter with concerns about hoyer lift stating that patient is fearful when in hoyer lift, and the way it squeezes her sides is extremely uncomfortable. Daughter is requesting a trapeze bar. Patient will need DME other order: "trapeze bar" in comments.   Patient is no longer able to be transported to HD in private vehicle. RNCM advised [name] to contact Dept of Social Services in [place] in order to activate transportation benefit. [name] has followed through and has left 2 voicemails requesting call back - f/u pending. Patient cannot be safely discharged home without securing transportation for dialysis.   Patient will need nonemergency stretcher transport for discharge.   UPDATE: Received call back from [place] at [employer]. Initial mini assessment completed and choice of home care providers offered. Discussed completion of expedited PCS hours with daughter, [name], and advised that  [facility] was offered as first choice for home care services, and that [facility] will also be providing her skilled services.   [name] and patient expressed concerns about feeling that they are being "pushed out" of hospital before they are ready to provide needed care. DSS is spending call back in order to initiated transportation benefit for HD transportation.   Patient stated that her goal is to be able to walk again like she was able to do a week ago. She wants to be able to fulfill her dialysis needs.   Discussed outpatient palliative care - [name] stated that she is being restarted for outpatient palliative with [facility].   Expected Discharge Plan: [place] (vs home with home health services) Barriers to Discharge: Continued Medical Work up  Expected Discharge Plan and Services Expected Discharge Plan: [place] (vs home with home health services) In-house Referral: Clinical Social Work   Post Acute Care Choice: [place] arrangements for the past 2 months: Single Family Home                                       Social Determinants of Health (SDOH) Interventions    Readmission Risk Interventions     No data to display

## 2021-08-11 NOTE — Progress Notes (Signed)
Daily Progress Note Intern Pager: [phone]  Patient name: [name] Medical record number: [id number] Date of birth: 17-Sep-1940 Age: 81 y.o. Gender: female  Primary Care Provider: [name], MD Consultants: Nephrology, WOC, GI  Code Status: DNR  Pt Overview and Major Events to Date:  7/11-admitted, held Eliquis 7/13 - Dialysis  7/15 - Dialysis ended early due to chest pain, received altepase in fistula.   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 Today she is medically stable to return home.  * Acute on chronic anemia Cause is AVMs, found on prior endoscopies, in setting of Apixaban.  - Transfusion threshold <8.  S/p 1 unit PRBC (7/12) and IV iron (7/13).  -GI consulted, recs appreciated, no interventions planned. Plan for outpatient IV iron and transfusions as needed.  - Hold Apixaban, continue holding for one week., Spoke to Dr. [name]. From cardiology.  - If stoma bleeds, will apply silver nitrate  - Nephrology started darbe on Thurs dialysis sessions   ESRD (end stage renal disease) on dialysis ([place]) TTHS dialysis schedule. Patient presented today after missed dialysis session on 08/02/21 -Consult Nephrology for dialysis, appreciate recs  -RFP daily -Strict I/O -Daily weights -Continue home Renvela  Elevated liver transaminase level - Downtrending, negative viral testing, repeat as outpatient   Weakness Slightly improved, due to anemia, ESRD with volume overload on admission, gastroparesis and multiple comorbid conditions  -PT/OT recommend SNF -Fall precautions -Delirium precautions -Tylenol as needed for pain  A-fib  [facility]) Patient asymptomatic.  On exam and on telemetry patient in A-fib. - Currently holding home Eliquis in the setting of GI bleed, patient prefers to restart though there is risk of increased bleeding, requiring transfusions. - Continue home amiodarone  Hypotension - Present on admission, has history of chronic hypotension - Evaluation for infection and adrenal insufficiency negative - Monitor, MAP >60 - Continue midodrine 10 mg TID  Skin ulcer of abdominal wall (HCC) On LLQ. 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 Was started on Prednisone burst, scheduled to finish 7/14 (but has missed two doses thus far). - Wound care consult, appreciate recommendations - Steroid cream ordered   Hypothyroidism Last last TSH from 02/16/2021 0.756. - Continue home levothyroxine  Swelling of upper arm Patient has had bruising and swelling of left upper arm.  Has not improved with dialysis.  Has had to pull out IVs around this location. VAS ultrasound of upper arm unremarkable.  - Continue to monitor   Insomnia and Anxiety  Patient takes 0.5 to 1 mg Klonopin nightly for sleep. - Continue Klonopin given low dose and palliative measure  Pressure injury of sacral region, stage 1 Please see picture from admission. -Wound care consult placed, recs appreciated  Diabetic gastroparesis ([place]) - Continue home Reglan - Continue home Pepcid    FEN/GI: Dysphagia 3  PPx: SCDs Dispo:Home with home health today. Barriers include daughter to speak with [facility] regarding transportation to dialysis center. She had a PT session yesterday, which was one of patient's wishes before going home. She will follow up with palliative outpatient.   Subjective:  Patient says she feels well today.   Objective: Temp:  [97.8 F (36.6 C)-98  F (36.7 C)] 98 F (36.7 C) (07/17 0413) Pulse Rate:  [58-64] 64 (07/17 0413) Resp:  [19] 19 (07/17 0413) BP: (95-128)/(30-59)  122/50 (07/17 0413) SpO2:  [99 %-100 %] 99 % (07/17 0413) Physical Exam: General: Chronically ill appearing, looks brighter today, more energetic and less somnolent Cardiovascular: RRR, pulses equal and regular Respiratory: no increased work of breathing on 3L  Abdomen: soft, non tender to palpation, stoma pink bag with brown fecal matter, abdominal wound dressed  Extremities: 2+ pitting edema to knees   Laboratory: No labs this AM   Lowry Ram, MD 08/11/2021, 7:34 AM  PGY-1, [place] Intern pager: [phone], text pages welcome Secure chat group [place]

## 2021-08-12 DIAGNOSIS — I132 Hypertensive heart and chronic kidney disease with heart failure and with stage 5 chronic kidney disease, or end stage renal disease: Secondary | ICD-10-CM | POA: Diagnosis not present

## 2021-08-12 DIAGNOSIS — I480 Paroxysmal atrial fibrillation: Secondary | ICD-10-CM | POA: Diagnosis not present

## 2021-08-12 DIAGNOSIS — D649 Anemia, unspecified: Secondary | ICD-10-CM | POA: Diagnosis not present

## 2021-08-12 DIAGNOSIS — I959 Hypotension, unspecified: Secondary | ICD-10-CM

## 2021-08-12 DIAGNOSIS — N186 End stage renal disease: Secondary | ICD-10-CM | POA: Diagnosis not present

## 2021-08-12 DIAGNOSIS — E039 Hypothyroidism, unspecified: Secondary | ICD-10-CM

## 2021-08-12 DIAGNOSIS — M7989 Other specified soft tissue disorders: Secondary | ICD-10-CM

## 2021-08-12 LAB — RENAL FUNCTION PANEL
Albumin: 1.9 g/dL — ABNORMAL LOW (ref 3.5–5.0)
Anion gap: 5 (ref 5–15)
BUN: 28 mg/dL — ABNORMAL HIGH (ref 8–23)
CO2: 25 mmol/L (ref 22–32)
Calcium: 9.6 mg/dL (ref 8.9–10.3)
Chloride: 105 mmol/L (ref 98–111)
Creatinine, Ser: 6.25 mg/dL — ABNORMAL HIGH (ref 0.44–1.00)
GFR, Estimated: 6 mL/min — ABNORMAL LOW (ref >60)
Glucose, Bld: 153 mg/dL — ABNORMAL HIGH (ref 70–99)
Phosphorus: 5.3 mg/dL — ABNORMAL HIGH (ref 2.5–4.6)
Potassium: 4.2 mmol/L (ref 3.5–5.1)
Sodium: 135 mmol/L (ref 135–145)

## 2021-08-12 LAB — HEPATITIS B SURFACE ANTIBODY, QUANTITATIVE: Hep B S AB Quant (Post): <3.1 m[IU]/mL — ABNORMAL LOW (ref >9.9)

## 2021-08-12 MED ORDER — APIXABAN 2.5 MG PO TABS: ORAL_TABLET | ORAL | 1 refills | Status: AC

## 2021-08-12 MED ORDER — POLYETHYLENE GLYCOL 3350 17 G PO PACK
17.0000 g | PACK | Freq: Every day | ORAL | 0 refills | Status: AC | PRN
Start: 2021-08-12 — End: ?

## 2021-08-12 MED ORDER — HEPARIN SODIUM (PORCINE) 1000 UNIT/ML IJ SOLN
INTRAMUSCULAR | Status: AC
  Filled 2021-08-12: qty 2

## 2021-08-12 NOTE — Progress Notes (Signed)
This chaplain updated the Pt. and daughter-Marsha on the status of a notary. The chaplain understands the medical team has started talking to the Pt. about discharge. If the Pt. is here on Wednesday, the Pt. prefers to have her AD notarized at the hospital.  The chaplain listened reflectively as the Pt. and [name] expressed the desire to have stronger communication and collaboration about the Pt. ability to d/c. The Pt. shares the medical team visited her in HD, however the Pt. can not remember the details of visit. [name] is hopeful ongoing communication with the Pt. will be with Marsha's presence.  This chaplain is available for F/U spiritual care as needed.  Chaplain Sallyanne Kuster [phone]

## 2021-08-12 NOTE — Progress Notes (Addendum)
[facility] Progress Note   Subjective:    Seen and examined on HD. UFG set 1L but held for now d/t low BP-SBP in 80s. Scheduled Midodrine given.   Objective Vitals:   08/12/21 0704 08/12/21 0719 08/12/21 0722 08/12/21 0730  BP: (!) 87/51  (!) 99/41 (!) 99/41  Pulse: 65  61 62  Resp: '20  18 15  '$ Temp: 97.7 F (36.5 C)     TempSrc:      SpO2: 100%  100% 100%  Weight:  92.8 kg    Height:       Physical Exam General: Alert chronically ill obese elderly female; NAD Heart: Rate irregular, A-fib, no MRG Lungs: CTA anteriorly and laterally; non-labored breathing Abdomen: NABS, NTND some abdominal wall edema but no ascites Extremities: Bilateral 1-2+ Pretib edema Dialysis Access: Left IJ TDC intact    Filed Weights   08/09/21 1825 08/12/21 0701 08/12/21 0719  Weight: 91.3 kg 92.8 kg 92.8 kg    Intake/Output Summary (Last 24 hours) at 08/12/2021 0834 Last data filed at 08/11/2021 1400 Gross per 24 hour  Intake --  Output 200 ml  Net -200 ml    Additional Objective Labs: Basic Metabolic Panel: Recent Labs  Lab 08/06/21 0538 08/07/21 0507 08/09/21 0644 08/12/21 0624  NA 136 137 137 135  K 4.1 4.4 3.8 4.2  CL 98 101 99 105  CO2 '26 25 27 25  '$ GLUCOSE 132* 156* 138* 153*  BUN 35* 46* 28* 28*  CREATININE 5.65* 7.11* 5.76* 6.25*  CALCIUM 8.2* 8.4* 8.4* 9.6  PHOS 5.3*  --  4.8* 5.3*   Liver Function Tests: Recent Labs  Lab 08/05/21 1641 08/06/21 0538 08/07/21 0507 08/09/21 0644 08/12/21 0624  AST 65*  --  49*  --   --   ALT 33  --  33  --   --   ALKPHOS 217*  --  211*  --   --   BILITOT 0.8  --  0.8  --   --   PROT 6.0*  --  5.3*  --   --   ALBUMIN 2.5*   < > 2.1* 1.9* 1.9*   < > = values in this interval not displayed.   No results for input(s): "LIPASE", "AMYLASE" in the last 168 hours. CBC: Recent Labs  Lab 08/05/21 1641 08/05/21 2025 08/06/21 0538 08/06/21 2244 08/07/21 0507 08/07/21 1153 08/08/21 0303 08/09/21 0147 08/10/21 0250   WBC 13.0*  --  10.4  --  9.0  --  10.5 8.8  --   NEUTROABS 10.8*  --   --   --   --   --   --   --   --   HGB 8.1*   < > 7.0*   < > 8.6*   < > 8.3* 8.6* 8.5*  HCT 28.8*   < > 25.2*   < > 29.1*   < > 28.2* 29.5* 29.1*  MCV 83.7  --  84.8  --  83.4  --  84.2 84.5  --   PLT 269  --  217  --  200  --  170 167  --    < > = values in this interval not displayed.   Blood Culture    Component Value Date/Time   SDES BLOOD LEFT ARM 08/05/2021 2014   [place]  08/05/2021 2014    BOTTLES DRAWN AEROBIC AND ANAEROBIC Blood Culture adequate volume   CULT  08/05/2021 2014    NO GROWTH  5 DAYS Performed at [facility], [place] [address] [place], [place] [address]    REPTSTATUS 08/10/2021 FINAL 08/05/2021 2014    Cardiac Enzymes: No results for input(s): "CKTOTAL", "CKMB", "CKMBINDEX", "TROPONINI" in the last 168 hours. CBG: No results for input(s): "GLUCAP" in the last 168 hours. Iron Studies: No results for input(s): "IRON", "TIBC", "TRANSFERRIN", "FERRITIN" in the last 72 hours. Lab Results  Component Value Date   INR 1.5 (H) 06/24/2021   INR 2.1 (H) 03/19/2021   INR 1.8 (H) 07/16/2020   Studies/Results: No results found.  Medications:   amiodarone  200 mg Oral Daily   calcitRIOL  0.25 mcg Oral Q T,Th,Sa-HD   Chlorhexidine Gluconate Cloth  6 each Topical Q0600   Chlorhexidine Gluconate Cloth  6 each Topical Q0600   clobetasol cream   Topical BID   clonazePAM  1 mg Oral Once per day on Sun Mon Wed Fri   darbepoetin (ARANESP) injection - DIALYSIS  200 mcg Intravenous Q Sat-HD   famotidine  10 mg Oral BID   heparin sodium (porcine)       levothyroxine  175 mcg Oral Q0600   metoCLOPramide  5 mg Oral TID AC & HS   midodrine  10 mg Oral TID WC   multivitamin  1 tablet Oral QHS    Dialysis Orders: TTS [facility]  3.5h  400/ 1.5  88.5kg   2/2 bath  [facility] TDC  Hep 2700 - last HD 7/11, coming off 3-4 kg over - mircera 225 q2 last 6/29, last Hb 7.3 - calcitriol 0.25 mcg po tiw   Home  meds include - amiodarone, apixaban, clonazepam prn, famotidine, metoclopramide 5 qid, rena-vit, home O2, sevelamer carbonate 800 ac tid, synthroid, vits/ supps/ prns   CXR 7/11 - IMPRESSION: 1. New small bilateral pleural effusions. 2. New left basilar atelectasis/airspace disease. 3. Stable cardiomegaly.   Assessment/Plan: Generalized weakness multifactorial- anemia, hypotension, esrd, chronic debility +/- other. Per primary team.  Symptomatic anemia - sp prbc's x1 on 7/12. , am Hb now 8.5. Per pmd. Addressing iron deficiency as below.  Debility/ FTT - w/ steady slow decline over the past 1-2 yrs family chooses to do home PT and not rehab. Hypotension - stable BP's in 90s- 110s, on midodrine 10 tid Volume - +LE edema, admit up 4 kg (per bed wt). Difficulty removing fluid d/t low BP. Continue midodrine. UF as tolerated as long BP permits.  ESRD - on HD TTS. on schedule.  On HD. 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 Tsat very low 5%. Pharmacy kindly was able to find an IV iron preparation (ferric gluconate) that she tolerated well at OP HD unit sometime in the last couple of yrs. Giving IV fe load at 250 mg qd x 4 and tolerating well.   MBD ckd - CCa and phos now 5.3. = per daughter not on binder , renvela home med listed  BUT has hx severe nausea noted on allergy listing thus  hold binder for now monitor phos lab // cont po vdra w hd.  Atrial fib - takes amio, eliquis at home. Per pmd.  Admit team holding Eliquis now with anemia Hypothyroidism - per pmd  Allison Poet, NP [place] 08/12/2021,8:34 AM  LOS: 0 days

## 2021-08-12 NOTE — Procedures (Signed)
HD Note  Patient was not able to tolerate any UF during this treatment.  Her SBP as in the 80s-90s.  Any attempt at UF resulted in further drop.  Patient left upper chest HD catheter was positional.  Head of the bed lowered to about 20 degrees with head turned slightly to the right.  Patient complained of pain with the wounds on her buttocks.  Tylenol given with no further complaints.  HD catheter lumens were locked with 2.76m of heparin 1000units./ml  3.5 hours of HD completed.

## 2021-08-12 NOTE — Final Consult Note (Signed)
[place] Nurse ostomy follow up Patient receiving care in [facility] 5N10 Follow up visit to check on patient and ostomy. Pouch was changed yesterday by bedside RN. No further bleeding episodes. WOC will sign off at this time. Any further needs, please re-consult.   Cathlean Marseilles Tamala Julian, MSN, RN, [place], Lysle Pearl, [facility] Wound Treatment Associate Pager [phone]

## 2021-08-12 NOTE — Progress Notes (Signed)
Daily Progress Note Intern Pager: [phone]  Patient name: [name] Medical record number: [id number] Date of birth: [birth year] Age: 81 y.o. Gender: female  Primary Care Provider: [name], MD Consultants: Nephrology, Wound Care, GI  Code Status: DNR  Pt Overview and Major Events to Date:  7/11-admitted, held Eliquis 7/13 - Dialysis  7/15 - Dialysis ended early due to chest pain, received altepase in fistula.  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  She is currently medically stable to return home, awaiting transportation.   * Acute on chronic anemia Cause is AVMs, found on prior endoscopies, in setting of Apixaban.  - Transfusion threshold <8.  S/p 1 unit PRBC (7/12) and IV iron (7/13).  -GI consulted, recs appreciated, no interventions planned. Plan for outpatient IV iron and transfusions as needed.  - If stoma bleeds, will apply silver nitrate  - Nephrology started darbe on Thurs dialysis sessions  - Continue holding Apixaban until end of the week, family and Dr. [name] (Cardiology) are in agreement   ESRD (end stage renal disease) on dialysis [facility] dialysis schedule. Patient presented today after missed dialysis session on 08/02/21 -Consult Nephrology for dialysis, appreciate recs  -RFP on dialysis days -Strict I/O -Daily weights -Continue home Renvela  Elevated liver transaminase level - Downtrending, negative viral testing, repeat as outpatient   Weakness Slightly improved, due to anemia, ESRD with volume overload on admission, gastroparesis and multiple comorbid  conditions  -PT/OT recommend SNF -Fall precautions -Delirium precautions -Tylenol as needed for pain  A-fib [facility]) Patient asymptomatic.  On exam and on telemetry patient in A-fib. - Currently holding home Eliquis in the setting of GI bleed, patient prefers to restart though there is risk of increased bleeding, requiring transfusions. - Continue home amiodarone  Hypotension Present on admission, has history of chronic hypotension. Evaluation for infection and adrenal insufficiency negative - Monitor, MAP >60 - Continue midodrine 10 mg TID  Skin ulcer of abdominal wall (HCC) On LLQ. 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 Was started on Prednisone burst, scheduled to finish 7/14 (but has missed two doses thus far). - Wound care consult, appreciate recommendations - Continue clobetazol on wound   Hypothyroidism Last last TSH from 02/16/2021 0.756. - Continue home levothyroxine  Swelling of upper arm Ecchymosis and swelling have improved. VAS ultrasound of upper arm unremarkable.  - Continue to monitor   Insomnia and Anxiety  Patient takes 0.5 to 1 mg Klonopin nightly for sleep. - Continue Klonopin given low dose and palliative measure  Pressure injury of sacral region, stage 1 Please see picture from admission. -Wound care consult placed, recs appreciated  Diabetic gastroparesis ([place]) - Continue home Reglan - Continue home Pepcid   FEN/GI: Dysphagia 3 PPx: SCDs Dispo:Medically stable for discharge. Barriers include transportation to dialysis. Daughter called DSS yesterday, awaiting response   Subjective:  Patient says she is sleepy and worn out at dialysis. She says she would like to be seen later and has no concerns at this time. Denies CP, SOB, abdominal pain, arm pain, pain in legs.   Objective: Temp:  [97.6 F (  36.4 C)-97.8 F (36.6 C)] 97.7 F (36.5 C) (07/18 0704) Pulse Rate:  [61-67] 63 (07/18 0913) Resp:   [13-20] 13 (07/18 0913) BP: (78-135)/(40-52) 92/47 (07/18 0913) SpO2:  [98 %-100 %] 100 % (07/18 0913) FiO2 (%):  [2 %] 2 % (07/18 0300) Weight:  [92.8 kg] 92.8 kg (07/18 0719) Physical Exam: General: Chronically ill appearing, laying in bed at dialysis Cardiovascular: well perfused, pulses equal  Respiratory: No increased work of breathing on 3 L  Abdomen: Soft, non tender to palpation, stoma pink   Laboratory: Most recent CBC Lab Results  Component Value Date   WBC 8.8 08/09/2021   HGB 8.5 (L) 08/10/2021   HCT 29.1 (L) 08/10/2021   MCV 84.5 08/09/2021   PLT 167 08/09/2021   Most recent BMP    Latest Ref Rng & Units 08/12/2021    6:24 AM  BMP  Glucose 70 - 99 mg/dL 153   BUN 8 - 23 mg/dL 28   Creatinine 0.44 - 1.00 mg/dL 6.25   Sodium 135 - 145 mmol/L 135   Potassium 3.5 - 5.1 mmol/L 4.2   Chloride 98 - 111 mmol/L 105   CO2 22 - 32 mmol/L 25   Calcium 8.9 - 10.3 mg/dL 9.6     Lowry Ram, MD 08/12/2021, 9:21 AM  PGY-1, [place] Intern pager: [phone], text pages welcome Secure chat group [place]

## 2021-08-12 NOTE — Procedures (Signed)
I was present at this dialysis session. I have reviewed the session itself and made appropriate changes. BP low but asymptomatic. Midodrine given  Filed Weights   08/09/21 1825 08/12/21 0701 08/12/21 0719  Weight: 91.3 kg 92.8 kg 92.8 kg    Recent Labs  Lab 08/12/21 0624  NA 135  K 4.2  CL 105  CO2 25  GLUCOSE 153*  BUN 28*  CREATININE 6.25*  CALCIUM 9.6  PHOS 5.3*    Recent Labs  Lab 08/05/21 1641 08/05/21 2025 08/07/21 0507 08/07/21 1153 08/08/21 0303 08/09/21 0147 08/10/21 0250  WBC 13.0*   < > 9.0  --  10.5 8.8  --   NEUTROABS 10.8*  --   --   --   --   --   --   HGB 8.1*   < > 8.6*   < > 8.3* 8.6* 8.5*  HCT 28.8*   < > 29.1*   < > 28.2* 29.5* 29.1*  MCV 83.7   < > 83.4  --  84.2 84.5  --   PLT 269   < > 200  --  170 167  --    < > = values in this interval not displayed.    Scheduled Meds:  amiodarone  200 mg Oral Daily   calcitRIOL  0.25 mcg Oral Q T,Th,Sa-HD   Chlorhexidine Gluconate Cloth  6 each Topical Q0600   Chlorhexidine Gluconate Cloth  6 each Topical Q0600   clobetasol cream   Topical BID   clonazePAM  1 mg Oral Once per day on Sun Mon Wed Fri   darbepoetin (ARANESP) injection - DIALYSIS  200 mcg Intravenous Q Sat-HD   famotidine  10 mg Oral BID   heparin sodium (porcine)       levothyroxine  175 mcg Oral Q0600   metoCLOPramide  5 mg Oral TID AC & HS   midodrine  10 mg Oral TID WC   multivitamin  1 tablet Oral QHS   Continuous Infusions: PRN Meds:.acetaminophen **OR** acetaminophen, alteplase, heparin, heparin sodium (porcine), lidocaine (PF), lidocaine-prilocaine, pentafluoroprop-tetrafluoroeth, polyethylene glycol, silver nitrate applicators   Santiago Bumpers,  MD 08/12/2021, 10:58 AM

## 2021-08-12 NOTE — Progress Notes (Signed)
Physical Therapy Treatment Patient Details Name: [name] MRN: [id number] DOB: [birth year] Today's Date: 08/12/2021   History of Present Illness 81 y.o. F admitted on 08/05/21 due to increased weakness. PMH significant for Afib on DOAC, recurrent GI bleed, DM2, Dementia, Obese, Gastroparesis, Combined CHF, ESRD on HD, Hx of GI cancer with hemicolectomy.    PT Comments    Pt in chair upon arrival, focus this session on transfer to bed using this stedy. Pt demonstrating increased initiation of movement for STS with stedy. Able to sit EOB for approximately 5 minutes with no LOB. Upon return to supine, pt with decreased responsiveness for 10 seconds, eventually responding to daughter's voice and remaining responsive. BP measured at 84/31 (44) initially and 85/42 (51) after a few minutes, RN notified. PT will continue to follow while inpatient to progress transfer quality and reduce caregiver load as pts family is wanting to take her home versus SNF. Still recommending SNF upon discharge due to current level of assistance required but HHPT if she returns home.    Recommendations for follow up therapy are one component of a multi-disciplinary discharge planning process, led by the attending physician.  Recommendations may be updated based on patient status, additional functional criteria and insurance authorization.  Follow Up Recommendations  Skilled nursing-short term rehab (<3 hours/day) (pt family with preference for home; recommend HHPT if declines SNF) Can patient physically be transported by private vehicle: No   Assistance Recommended at Discharge Frequent or constant Supervision/Assistance  Patient can return home with the following Two people to help with walking and/or transfers;Two people to help with bathing/dressing/bathroom;Assistance with cooking/housework;Help with stairs or ramp for entrance;Assist for transportation   Equipment Recommendations  None recommended by PT     Recommendations for Other Services       Precautions / Restrictions Precautions Precautions: Fall Precaution Comments: watch BP Restrictions Weight Bearing Restrictions: No     Mobility  Bed Mobility Overal bed mobility: Needs Assistance Bed Mobility: Sit to Supine       Sit to supine: Mod assist, +2 for physical assistance   General bed mobility comments: pt able to initiate return to supine with eventual mod of 2    Transfers Overall transfer level: Needs assistance Equipment used: Ambulation equipment used Transfers: Sit to/from Stand, Bed to chair/wheelchair/BSC Sit to Stand: Mod assist, +2 physical assistance           General transfer comment: mod+2 to stand to stedy with cueing for pulling with UEs and pushing up with LEs. Cues for upright posture Transfer via Lift Equipment: Stedy  Ambulation/Gait                   Stairs             Wheelchair Mobility    Modified Rankin (Stroke Patients Only)       Balance Overall balance assessment: Needs assistance Sitting-balance support: Bilateral upper extremity supported, Feet supported Sitting balance-Leahy Scale: Fair     Standing balance support: Bilateral upper extremity supported Standing balance-Leahy Scale: Poor Standing balance comment: reliant on stedy                            Cognition Arousal/Alertness: Awake/alert Behavior During Therapy: WFL for tasks assessed/performed Overall Cognitive Status: Impaired/Different from baseline Area of Impairment: Problem solving  Problem Solving: Slow processing, Requires verbal cues, Requires tactile cues General Comments: increased time and cueing for command following        Exercises      General Comments        Pertinent Vitals/Pain Pain Assessment Pain Assessment: Faces Faces Pain Scale: Hurts a little bit Pain Location: stomach and chest Pain Descriptors /  Indicators: Aching Pain Intervention(s): Limited activity within patient's tolerance, Monitored during session    Home Living                          Prior Function            PT Goals (current goals can now be found in the care plan section) Acute Rehab PT Goals Patient Stated Goal: to go home PT Goal Formulation: With patient/family Time For Goal Achievement: 08/20/21 Potential to Achieve Goals: Fair Progress towards PT goals: Progressing toward goals    Frequency    Min 3X/week      PT Plan Current plan remains appropriate    Co-evaluation              AM-PAC PT "6 Clicks" Mobility   Outcome Measure  Help needed turning from your back to your side while in a flat bed without using bedrails?: A Lot Help needed moving from lying on your back to sitting on the side of a flat bed without using bedrails?: A Lot Help needed moving to and from a bed to a chair (including a wheelchair)?: Total Help needed standing up from a chair using your arms (e.g., wheelchair or bedside chair)?: Total Help needed to walk in hospital room?: Total Help needed climbing 3-5 steps with a railing? : Total 6 Click Score: 8    End of Session Equipment Utilized During Treatment: Gait belt;Oxygen Activity Tolerance: Patient tolerated treatment well;Patient limited by fatigue Patient left: in bed;with call bell/phone within reach;with bed alarm set;with family/visitor present Nurse Communication: Mobility status PT Visit Diagnosis: Unsteadiness on feet (R26.81);Muscle weakness (generalized) (M62.81);Difficulty in walking, not elsewhere classified (R26.2)     Time: 5993-5701 PT Time Calculation (min) (ACUTE ONLY): 22 min  Charges:  $Therapeutic Activity: 8-22 mins                     Mackie Pai, SPT Acute Rehabilitation Services  Office: [phone]    Mackie Pai 08/12/2021, 3:49 PM

## 2021-08-12 NOTE — Progress Notes (Signed)
[facility] here to transport pt home.Daughter present at time of discharge.

## 2021-08-12 NOTE — TOC Transition Note (Signed)
Transition of Care [facility]) - CM/SW Discharge Note   Patient Details  Name: [name] MRN: [id number] Date of Birth: [birth year]  [facility] CM/SW Contact:  Sharin Mons, RN Phone Number: 08/12/2021, 8:57 PM   Clinical Narrative:    Late entry: 08/12/2021 @ 1715  Patient will DC to: home with family Anticipated DC date: 08/12/2021 Family notified: yes , daughter Transport by: Corey Harold   Per MD patient ready for DC today . RN, patient, and  patient's daughter aware of d/c plan. Daughter stater transportation with [facility] in place for pt transportation to HD, [facility] on Thursday. Pt without DME needs. Post hospital f/u noted on AVS. Outpatient palliative care in place with [facility]. Post hospital f/u noted  on AVS. Daughter with Rx med concerns.  PTAR/ ambulance transport requested for patient.  RNCM will sign off for now as intervention is no longer needed. Please consult [place] again if new needs arise.   Final next level of care: [place] Barriers to Discharge: No Barriers Identified   Patient Goals and CMS Choice Patient states their goals for this hospitalization and ongoing recovery are:: walking   Choice offered to / list presented to : Adult Children (daughter [name])  Discharge Placement                       Discharge Plan and Services In-house Referral: Clinical Social Work   Post Acute Care Choice: Home Health                               Social Determinants of Health (SDOH) Interventions     Readmission Risk Interventions     No data to display

## 2021-08-12 NOTE — Progress Notes (Signed)
PT Cancellation Note  Patient Details Name: [name] MRN: [id number] DOB: 11-Jun-1940   Cancelled Treatment:    Reason Eval/Treat Not Completed: Patient at procedure or test/unavailable (HD)  Wyona Almas, PT, DPT Acute Rehabilitation Services Office [place] 08/12/2021, 7:38 AM

## 2021-08-12 NOTE — Plan of Care (Signed)

## 2021-08-12 NOTE — Discharge Summary (Addendum)
[facility] Discharge Summary  Patient name: [name] Medical record number: [id number] Date of birth: Jun 25, 1940 Age: 81 y.o. Gender: female Date of Admission: 08/05/2021  Date of Discharge: 08/13/21  Admitting Physician: Salvadore Oxford, MD  Primary Care Provider: [name], MD Consultants: Nephrology, GI, Wound Care  Indication for Hospitalization: Anemia  Brief Hospital Course:  [name] is a 81 y.o. female with pertinent PMHx of Lynch syndrome, colon cancer s/p right hemicolectomy, small bowel AVM, and renal cell cancer, now ESRD on dialysis who presented for weakness. Patient was admitted to [facility] service for acute on chronic anemia and hypotension. Please see problem based Hospital Course below:  Weakness Patient was admitted with acute on chronic weakness. She was weak in the setting of increased bleeding from around her stoma and a missed dialysis session. Patient did not have any focal findings. She was transfused with 1u RBC (7/12)and given iron once (7/13) during her admission. ACTH stim testing was unremarkable. GI was consulted as patient has history of AVM bleeds and elected to take conservative treatments. She was seen by PT during her admission and continued dialysis [facility].   Acute on chronic anemia Patient presented to ED with hemoglobin of 8.1 on admission, an acute change from her baseline of around 9. In the ED, her fecal occult blood test was positive concerning for new gastrointestinal bleed source. GI was consulted and recommended conservative treatment as per patient's wishes.  They recommended CBC checks with dialysis and blood and iron transfusions as needed.  It was stable and hemostatic on discharge. Dr. [name] (Cardiology), patient, and primary team have agreed to restart Eliquis on the 21st of July per patient's wishes.    ESRD (end stage renal disease) on dialysis [facility]) Patient presented to  hospital after missed dialysis session on 08/02/21, but attended session on 08/05/21 (day of admission). Basic metabolites were reassuring in ED with patient's creatinine at 4.57 around baseline of 5. Nephrology was consulted on 7/12.  Hemodialysis was continued during admission. Patient has transportation to dialysis set up through [facility].   Hypotension Blood pressure in the ED ranged from 41-638 systolic over 45-36 diastolic, WBC elevated to 13.0, LA 3.4, and CXR showed new bilateral pleural effusions. Patient started on cefepime in ED with concern for sepsis. Patient started on home midodrine.  Blood pressure was controlled during admission.  Cortisol was 6.2.  ACTH stim test showed low risk for primary adrenal insufficiency. She generally maintained MAP >65 while on Midodrine TID.    Issues for Follow-Up: Repeat CBC as outpatient, likely will need PRN transfusions per GI. This could be facilitated through outpatient transfusion center if recurrent.  Monitor for  bleeding from stoma site, can apply silver nitrite  Follow up any missed dialysis sessions. 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 Other Problems addressed during stay:  ESRD Hypotension  Atrial fibrillation on anticoagulation previously, held due to above problem Pyoderma gangrenosum  Insomnia Diabetic Gastroparesis    Disposition: Home with home health/PT  Discharge Condition: Stable  Discharge Exam:  Vitals:   08/12/21 1638 08/12/21 2103  BP: (!) 92/57 (!) 90/44  Pulse: 62 62  Resp:  18  Temp:  98.4 F (36.9 C)  SpO2:  95%   Physical Exam:  General: Chronically ill appearing, laying in bed  Cardiovascular: well perfused, pulses equal, RRR  Respiratory: No increased work of  breathing on 3 L  Abdomen: Soft, non tender to palpation, stoma pink with brown stool no bleeding  Neuro: Alert and Oriented x4, pleasant and conversant   Significant Procedures: None  Significant Labs and Imaging:  No results for input(s): "WBC", "HGB", "HCT", "PLT" in the last 48 hours. Recent Labs  Lab 08/12/21 0624  NA 135  K 4.2  CL 105  CO2 25  GLUCOSE 153*  BUN 28*  CREATININE 6.25*  CALCIUM 9.6  PHOS 5.3*  ALBUMIN 1.9*     Results/Tests Pending at Time of Discharge: None  Discharge Medications:  Allergies as of 08/12/2021       Reactions   Adhesive [tape] Itching, Swelling, Rash, Other (See Comments)   Tears skin and causes blisters also. EKG pads will cause welts   Avelox [moxifloxacin] Swelling, Rash   Banana Anaphylaxis, Other (See Comments)   Blisters appear also   Blueberry Flavor Anaphylaxis   Cantaloupe Extract Allergy Skin Test Anaphylaxis, Other (See Comments)   Blisters appear also   Cefprozil Shortness Of Breath, Rash, Other (See Comments)   Tolerated ceftriaxone and cefepime multiple times 05/2021, 07/2021   Cetacaine [butamben-tetracaine-benzocaine] Nausea And Vomiting, Swelling   Food Anaphylaxis, Other (See Comments)   Melons- throat closes and blisters appear   Januvia [sitagliptin] Shortness Of Breath   Lipitor [atorvastatin] Shortness Of Breath   Losartan Potassium Shortness Of Breath   Nitroglycerin Other (See Comments)   Caused cardiac arrest and feels like skin bring torn off back of head   Omeprazole Shortness Of Breath, Swelling   Oxycodone Hives, Rash, Other (See Comments)   Tolerates Dilaudid   Penicillins Anaphylaxis   Has patient had a PCN reaction causing immediate rash, facial/tongue/throat swelling, SOB or lightheadedness with hypotension: Yes Has patient had a PCN reaction causing severe rash involving mucus membranes or skin necrosis: No Has patient had a PCN reaction that required hospitalization Yes Has patient had a PCN  reaction occurring within the last 10 years: No   Vancomycin Anaphylaxis   Watermelon [citrullus Vulgaris] Anaphylaxis, Other (See Comments)   Blisters appear also   Dicyclomine Nausea And Vomiting, Other (See Comments)   "Heart trouble"; Headaches and increased blood sugars   Hydrocodone Hives, Other (See Comments)   Tolerates Dilaudid   Imdur [isosorbide Nitrate] Hives, Palpitations, Rash, Other (See Comments)   Headaches also   Latex Rash, Other (See Comments)   Blisters   Tamiflu [oseltamivir] Other (See Comments)   Contraindicated with other medications Patient on tikosyn, and tamiflu interfered with anti arrhythmic med Contraindicated with other medications Patient on tikosyn, and tamiflu interfered with anti arrhythmic med   Feraheme [ferumoxytol] Other (See Comments)   Sharp pain to lower back and flank. Tolerated IV Ferric Gluconate x5 in 2022 at HD center and 08/08/21 inpatient   Other Other (See Comments)   Hydrogen - unknown- patient does not recall this (??)   Renvela [sevelamer Carbonate] Nausea And Vomiting   Lasix [furosemide] Hives, Swelling, Rash   Mupirocin Rash        Medication List     STOP taking these medications    cephALEXin 500 MG capsule Commonly known as: KEFLEX   sevelamer carbonate 800 MG tablet Commonly known as: RENVELA       TAKE these medications    acetaminophen 500 MG tablet Commonly known as: TYLENOL Take 0.5 tablets (250  mg total) by mouth every 6 (six) hours as needed for mild pain (pain). What changed:  how much to take when to take this   albuterol 108 (90 Base) MCG/ACT inhaler Commonly known as: VENTOLIN HFA Inhale 1-2 puffs into the lungs every 6 (six) hours as needed for wheezing or shortness of breath.   amiodarone 200 MG tablet Commonly known as: PACERONE TAKE 1 TABLET EVERY DAY   apixaban 2.5 MG Tabs tablet Commonly known as: Eliquis MAY RESUME TAKING 7/21  TAKE ONE (1) TABLET BY MOUTH TWO (2) TIMES  DAILY What changed: additional instructions   cetirizine 10 MG tablet Commonly known as: ZYRTEC Take 10 mg by mouth daily.   clobetasol ointment 0.05 % Commonly known as: TEMOVATE Apply 1 Application topically 2 (two) times daily.   clonazePAM 1 MG tablet Commonly known as: KLONOPIN Take 0.5-1 mg by mouth See admin instructions. Take 1 mg by mouth at bedtime on Sun/Mon/Wed/Fri and 0.5 mg at bedtime on Tues/Thurs/Sat- may take an additional 0.5-1 mg up to two (more) times a day as needed for anxiety (total combined daily dose is a max of 3 milligrams)   cyanocobalamin 500 MCG tablet Commonly known as: CYANOCOBALAMIN Take 500 mcg by mouth daily.   Heartburn Relief 10 MG tablet Generic drug: famotidine Take 1 tablet (10 mg total) by mouth 2 (two) times daily.   leptospermum manuka honey Pste paste Apply 1 application. topically daily.   metoCLOPramide 5 MG tablet Commonly known as: REGLAN Take 1 tablet (5 mg total) by mouth 4 (four) times daily -  before meals and at bedtime.   midodrine 10 MG tablet Commonly known as: PROAMATINE Take 10 mg by mouth 3 (three) times daily.   multivitamin Tabs tablet Take 1 tablet by mouth at bedtime.   OXYGEN Inhale 3 L into the lungs as needed (shortness of breath).   polyethylene glycol 17 g packet Commonly known as: MIRALAX / GLYCOLAX Take 17 g by mouth daily as needed for mild constipation.   promethazine 12.5 MG tablet Commonly known as: PHENERGAN Take 12.5 mg by mouth every 6 (six) hours as needed for nausea or vomiting.   Synthroid 175 MCG tablet Generic drug: levothyroxine Take 175 mcg by mouth daily before breakfast.       Follow-Up Appointments:  Follow-up Oliver. Go on 08/13/2021.   Specialty: Family Medicine Why: At 10:10 am. Please arrive by 9:55 am. This is your hospital follow up appointment. You will see our "same day" appointment provider. Contact information: [address] [phone] mc [address] [place]        [name], MD. Daphane Shepherd on 08/28/2021.   Specialty: Family Medicine Why: At 1:55 pm. Please arrive by 1:40 pm. This is your follow up appointment with your primary care doctor. Contact information: [place] [address] [phone]         [name], Peter M, MD .   Specialty: Cardiology Contact information: [address] [place] [address] [place], [facility] Follow up.   Specialty: [place] Why: Someone from the office will call within 48 hours of discharge to schedule home health visits Contact information: [place] [address] [phone]         [facility]. Call.   Why: If you do not hear from [facility] or other  agency about personal care hours, call this number. Have her Medicaid ID# available. Contact information: [phone]        [facility] Follow up.   Why: this is for outpatient palliative care - someone will reach out for monthly visits Contact information: [address] [place] [address] [phone]                 Orvis Brill, DO 08/13/2021, 7:31 AM PGY-1, [place]

## 2021-08-13 ENCOUNTER — Ambulatory Visit: Payer: Medicare HMO

## 2021-08-13 NOTE — TOC Transition Note (Signed)
Transition of care contact from inpatient facility  Date of discharge: 08/12/21 Date of contact: 08/13/21 Method: Phone Spoke to: [name]  Patient contacted to discuss transition of care from recent inpatient hospitalization. Patient was admitted to [facility] from 08/05/21-08/12/21 with discharge diagnosis of acute on chronic hypotension.  Medication changes were reviewed.  [name] [name]) expressed concerns about Ms. Roger's blood pressure. [name] reports SBP ranging in 80s when checked by home health RN today. She is currently on Midodrine '10mg'$  TID. Discussed with patient's daughter on dialysis sessions here in the hospital. Unfortunately, had difficulty completing sessions due to low blood pressures. Spoke with HD RN at [facility] who also endorsed difficulty completing sessions d/t low blood pressures. Patient's expresses concern that Ms. Barbe is getting to the point where she can no longer tolerate hemodialysis. Patient was previously referred to Palliative Care but signed off because family didn't think it was needed at that time. I explained to patient's daughter that if patient's medical condition continues to decline, we may need to consider having a family meeting to discuss goals of care. [name] verbalized understanding. Discussed case with Dr. Joelyn Oms. Plan for renal team to see patient during routine rounds tomorrow at [facility].  Tobie Poet, NP

## 2021-08-13 NOTE — Progress Notes (Signed)
Late Entry Note:  Pt was d/c to home late yesterday afternoon. Contacted [facility] this morning to advise clinic of pt's d/c and that pt should resume care on Thursday.   Melven Sartorius Renal Navigator [phone]

## 2021-08-13 NOTE — Progress Notes (Deleted)
SUBJECTIVE:   CHIEF COMPLAINT / HPI:   Hospital F/u:  [name] is a 81 yo F with PMHx of Lynch syndrome, colon cancer s/p right hemicolectomy, small bowel AVM, and renal cell cancer, now ESRD on dialysis who presented for weakness and was subsequently admitted to the hospital, discharged on 08/12/21. Dialysis transportation via [facility] per inpatient team. She has TTS dialysis.   Hypotension: Persistent with Midodrine use    PERTINENT  PMH / PSH:   Past Medical History:  Diagnosis Date   Acute on chronic combined systolic and diastolic CHF (congestive heart failure) ([place]) 08/26/2018   Acute right-sided CHF (congestive heart failure) ([place]) 08/02/2013   Adenomatous colon polyp 02/13/2009   Allergy    april- september    Anaphylactic shock, unspecified, initial encounter 05/21/2021   Anemia    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   Anxiety    Anxiety state 02/07/2009   Qualifier: Diagnosis of  By: Zeb Comfort     Asthma    Atrial fibrillation ([place]) 05/13/2014   Atrial flutter ([place])    AVM (arteriovenous malformation) of small bowel, acquired with hemorrhage    Bacteremia 07/01/2020   Bell's palsy 2013   Blood loss anemia 02/15/2021   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[place]) 03/12/2020   Formatting of this note might be different from the original. 63m ulcerated mass in cecum which path revealed as adenocarcinoma   Cancer of renal pelvis, right ([facility] 12/07/2014   a. 01/2015 s/p robot assisted lap nephroureterectomy, lysis of adhesions. Formatting of this note might be different from the original. very large volume TaG1 of rt upper pole / renal pelvis   Cancer of right renal pelvis ([facility]    a. 01/2015 s/p robot assisted lap nephroureterectomy, lysis of adhesions.   CAROTID STENOSIS 01/29/2010   Qualifier: Diagnosis of  By: HPercival Spanish MD, FFarrel Gordon    Cellulitis 05/30/2015   Cellulitis, abdominal wall 05/30/2015   Chronic combined systolic and diastolic CHF (congestive heart failure) ([facility]     a. 12/2012 Echo: EF 45%, grade 3 DD; b. 08/2014 TEE: EF 55%.   Chronic diastolic CHF (congestive heart failure) ([facility]iguel Bar[place] 09/22/2013   Chronic respiratory failure (HCC)    Coagulation defect, unspecified (HGreasy 040/98/1191  Complication of anesthesia    difficult to awaken , N/V   Controlled diabetes mellitus type 2 with complications ([place] 047/82/9562  COVID-19 01/30/2020   Degenerative disc disease, cervical    Dementia ([place]    Depression    Diabetes mellitus without complication ([facility]    Type II   Diabetic gastroparesis ([facility] 03/20/2021   Dx by Dr RGala Romney([facility], [place]) before 2014. Subsequently tx'd by Dr MJerilynn Mages SFuller Plan([facility], starting 2014)   Diabetic polyneuropathy ([facility] 07/31/2016   Diverticulitis    DIVERTICULITIS, HX OF 02/07/2009   Qualifier: Diagnosis of  By: SZeb Comfort    DM neuropathy, type II diabetes mellitus ([facility] 01/09/2013   DYSPHAGIA UNSPECIFIED 02/07/2009   Qualifier: Diagnosis of  By: SZeb Comfort    ESRD on hemodialysis ([facility]    T/Th/ Sat HJeneen Rinks  Family history of adverse reaction to anesthesia    Father - N/V   GASTROESOPHAGEAL REFLUX DISEASE, CHRONIC 02/07/2009   Qualifier: Diagnosis of  By: SZeb Comfort    Gastroparesis    Dx by Dr RGala Romney([facility], [place] ) before 2014. Subsequently tx'd by Dr MJerilynn Mages SFuller Plan([facility], starting 2014)   GERD (gastroesophageal reflux disease)    Gout  04/06/2019   Grade II diastolic dysfunction 86[birth year]1950   transthoracic echocardiogram 01/2021   Hematoma 07/2015   post Nephrectomy   Hiatal hernia    History of blood transfusion    History of kidney stones    passed   HOH (hard of hearing)    Hyperlipidemia    Hyperlipidemia associated with type 2 diabetes mellitus ([place]) 01/11/2013   Hypertension    Hypertension associated with chronic kidney disease due to type 2 diabetes mellitus ([place])    Hypertensive heart disease    Hypothyroidism    IBS (irritable bowel syndrome)    Influenza with respiratory manifestations  04/18/2014   Iron deficiency anemia, unspecified 10/13/2018   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   Lynch syndrome    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HCC)    Malignant neoplasm of descending colon (HCC)    MSSA (methicillin susceptible Staphylococcus aureus) septicemia ([place])    MSSA bacteremia 06/20/2020   Multiple drug allergies 04/07/2019   Neuropathy of both feet    NICM (nonischemic cardiomyopathy) ([place])    a. 12/2012 Echo: EF 45% with grade 3 DD;  b. 08/2014 TEE: EF 55%, no rwma, mod RAE, mod-sev LAE, triv MR/TR, No LAA thrombus, no PFO/ASD, Grade III plaque in desc thoracic Ao.   Non-obstructive CAD    NSTEMI (non-ST elevated myocardial infarction) ([place]) 01/08/2013   Obesity (BMI 30-39.9)    Obesity, Class III, BMI 40-49.9 (morbid obesity) ([place]) 03/05/2010   Qualifier: Diagnosis of  By: Percival Spanish, MD, Farrel Gordon     Occult blood in stools    On home oxygen therapy 05/07/2020   Formatting of this note might be different from the original. 3L PRN during day and night   Osteoarthritis    Osteoarthrosis, unspecified whether generalized or localized, unspecified site 02/07/2009   Centricity Description: OSTEOARTHRITIS Qualifier: Diagnosis of  By: Zeb Comfort   Centricity Description: DEGENERATIVE JOINT DISEASE Qualifier: Diagnosis of  By: Zeb Comfort     Other disorders of phosphorus metabolism 11/18/2018   Palpitations 01/29/2010   Qualifier: Diagnosis of  By: Percival Spanish, MD, Farrel Gordon     Persistent atrial fibrillation ([place])    PFO (patent foramen ovale)    trivial by TEE 06/2020   PONV (postoperative nausea and vomiting)    Presence of other vascular implants and grafts 08/22/2019   Pressure injury of skin 02/16/2021   PSVT (paroxysmal supraventricular tachycardia) ([place])    Secondary hyperparathyroidism of renal origin ([place]) 09/21/2018   Sleep apnea    pt scored 5 per stop bang tool per PAT visit 02/14/2015; results sent to PCP Dr Melina Copa     Status post dilation of esophageal narrowing    Syncope    a. 12/2012: MDT Reveal LINQ ILR placed;  b. 12/2012 Echo: EF 45-50%, Gr 3 DD, mild MR, mildly dil LA;  c. 12/2012 Carotid U/S: 1-39% bilat ICA stenosis.   Toe injury    Urothelial cancer ([place])    UTI (lower urinary tract infection) 05/30/2015   Ventral hernia 06/10/2021   Vitamin B12 deficiency 07/31/2016   Vitamin D deficiency    Wears glasses     OBJECTIVE:  There were no vitals taken for this visit.  General: NAD, pleasant, able to participate in exam Cardiac: RRR, no murmurs auscultated Respiratory: CTAB, normal WOB Abdomen: soft, non-tender, non-distended, normoactive bowel sounds Extremities: warm and well perfused, no edema or cyanosis Skin: warm and dry, no rashes noted Neuro: alert, no obvious focal deficits,  speech normal Psych: Normal affect and mood  ASSESSMENT/PLAN:  No problem-specific Assessment & Plan notes found for this encounter.   No orders of the defined types were placed in this encounter.  No orders of the defined types were placed in this encounter.  No follow-ups on file. Erskine Emery, MD PGY-2 Family Medicine  {    This will disappear when note is signed, click to select method of visit    :1}

## 2021-08-22 ENCOUNTER — Telehealth: Payer: Self-pay | Admitting: *Deleted

## 2021-08-22 NOTE — Telephone Encounter (Signed)
Daughter called to let Dr. Adah Salvage know that her mom passed on 05/10/2022 AM.  She also wanted to thank him, Page and Jarrett Soho for all their help.  She reports that "they took good care of her".   Christen Bame, CMA

## 2021-08-26 DEATH — deceased

## 2021-08-28 ENCOUNTER — Ambulatory Visit: Payer: Medicare HMO | Admitting: Student

## 2021-09-02 ENCOUNTER — Other Ambulatory Visit (HOSPITAL_COMMUNITY): Payer: Self-pay

## 2021-09-14 NOTE — Progress Notes (Deleted)
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 function is normal. The right ventricular  size is normal.   4. Left atrial size was mild to moderately dilated. No left atrial/left  atrial appendage thrombus was detected. The LAA emptying velocity was 54  cm/s.   5. A small pericardial effusion is present. The pericardial effusion is  circumferential. There is no evidence of cardiac tamponade.   6. The mitral valve is grossly normal. Trivial mitral valve  regurgitation. No evidence of mitral stenosis.   7. The aortic valve is tricuspid. There is mild calcification of the  aortic valve. There is mild thickening of the aortic valve. Aortic valve  regurgitation is not visualized. Mild aortic valve sclerosis is present,  with no evidence of aortic valve  stenosis.   8. There is mild (Grade II) layered plaque involving the transverse aorta  and descending aorta.   9. Agitated saline contrast bubble study was positive with shunting  observed within 3-6 cardiac cycles suggestive of interatrial shunt. There  is a small patent foramen ovale with  predominantly right to left shunting  across the atrial septum.   Conclusion(s)/Recommendation(s): No evidence of vegetation/infective  endocarditis on this transesophageal  echocardiogram.   Echo 02/19/21: IMPRESSIONS     1. Left ventricular ejection fraction, by estimation, is 55%. The left  ventricle has normal function. The left ventricle has no regional wall  motion abnormalities. There is mild left ventricular hypertrophy. Left  ventricular diastolic parameters are  consistent with Grade II diastolic dysfunction (pseudonormalization).   2. Right ventricular systolic function is normal. The right ventricular  size is mildly enlarged. Tricuspid regurgitation signal is inadequate for  assessing PA pressure.   3. Left atrial size was mildly dilated.   4. Right atrial size was moderately dilated.   5. The mitral valve is degenerative. No evidence of mitral valve  regurgitation. No evidence of mitral stenosis. Moderate mitral annular  calcification.   6. The aortic valve is tricuspid. Aortic valve regurgitation is not  visualized. The aortic valve was heavily calcified and visually appeared  stenotic. However, an elevated mean gradient was not measured.   7. Aortic dilatation noted. There is mild dilatation of the ascending  aorta, measuring 38 mm.   8. The inferior vena cava is normal in size with greater than 50%  respiratory variability, suggesting right atrial pressure of 3 mmHg.    ASSESSMENT:    No diagnosis found.     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  On no antianginal therapy due to hypotension.    2.   ESRD. Now on HD.  Hypotension during dialysis and at other times. Now on midodrine daily. Could increase to tid if needed.   3.   Atrial fibrillation/paroxysmal: appears to be maintaining NSR. Continue amiodarone and Eliquis- at lower dose.  4.   Chronic systolic heart failure: Euvolemic on physical exam. Volume  status maintained with dialysis. Last EF 50-55%.   5.  DM 2:     I will follow up in 4 months.    Medication Adjustments/Labs and Tests Ordered: Current medicines are reviewed at length with the patient today.  Concerns regarding medicines are outlined above.  Medication changes, Labs and Tests ordered today are listed in the Patient Instructions below. There  are no Patient Instructions on file for this visit.   Signed, Emon Miggins [name], [place] 09/14/2021 10:11 AM    [facility]

## 2021-09-15 ENCOUNTER — Telehealth: Payer: Self-pay | Admitting: Cardiology

## 2021-09-15 NOTE — Telephone Encounter (Signed)
Pt's daughter would like for nurse Malachy Mood) to return call. Please advise

## 2021-09-17 NOTE — Telephone Encounter (Signed)
Spoke to patient's daughter [name] she stated mother passed away 1 week ago.She wanted to thank Dr.Jordan for all his wonderful care.Advised I will make him aware.Sympathy expressed.

## 2021-09-18 ENCOUNTER — Ambulatory Visit: Payer: Medicare HMO | Admitting: Cardiology

## 2021-10-27 ENCOUNTER — Other Ambulatory Visit (HOSPITAL_COMMUNITY): Payer: Self-pay
